# Patient Record
Sex: Female | Born: 1940 | Race: White | Hispanic: No | Smoking: Never smoker
Health system: Southern US, Community
[De-identification: ages and names within clinical notes are randomized; demographics above are authoritative.]

## PROBLEM LIST (undated history)

## (undated) DIAGNOSIS — E039 Hypothyroidism, unspecified: Secondary | ICD-10-CM

## (undated) DIAGNOSIS — I639 Cerebral infarction, unspecified: Secondary | ICD-10-CM

## (undated) DIAGNOSIS — S52531B Colles' fracture of right radius, initial encounter for open fracture type I or II: Secondary | ICD-10-CM

## (undated) DIAGNOSIS — R3915 Urgency of urination: Secondary | ICD-10-CM

## (undated) DIAGNOSIS — K219 Gastro-esophageal reflux disease without esophagitis: Secondary | ICD-10-CM

## (undated) DIAGNOSIS — I771 Stricture of artery: Principal | ICD-10-CM

## (undated) DIAGNOSIS — I10 Essential (primary) hypertension: Secondary | ICD-10-CM

## (undated) DIAGNOSIS — E538 Deficiency of other specified B group vitamins: Secondary | ICD-10-CM

## (undated) DIAGNOSIS — S32591A Other specified fracture of right pubis, initial encounter for closed fracture: Secondary | ICD-10-CM

## (undated) DIAGNOSIS — R413 Other amnesia: Secondary | ICD-10-CM

## (undated) DIAGNOSIS — G2401 Drug induced subacute dyskinesia: Secondary | ICD-10-CM

## (undated) DIAGNOSIS — I341 Nonrheumatic mitral (valve) prolapse: Secondary | ICD-10-CM

## (undated) DIAGNOSIS — F329 Major depressive disorder, single episode, unspecified: Secondary | ICD-10-CM

## (undated) DIAGNOSIS — S3210XA Unspecified fracture of sacrum, initial encounter for closed fracture: Secondary | ICD-10-CM

## (undated) DIAGNOSIS — S82843A Displaced bimalleolar fracture of unspecified lower leg, initial encounter for closed fracture: Secondary | ICD-10-CM

## (undated) DIAGNOSIS — G629 Polyneuropathy, unspecified: Secondary | ICD-10-CM

## (undated) DIAGNOSIS — M1990 Unspecified osteoarthritis, unspecified site: Secondary | ICD-10-CM

## (undated) DIAGNOSIS — R011 Cardiac murmur, unspecified: Secondary | ICD-10-CM

## (undated) DIAGNOSIS — J320 Chronic maxillary sinusitis: Secondary | ICD-10-CM

## (undated) DIAGNOSIS — H6980 Other specified disorders of Eustachian tube, unspecified ear: Secondary | ICD-10-CM

## (undated) DIAGNOSIS — H6990 Unspecified Eustachian tube disorder, unspecified ear: Secondary | ICD-10-CM

## (undated) DIAGNOSIS — C801 Malignant (primary) neoplasm, unspecified: Secondary | ICD-10-CM

## (undated) DIAGNOSIS — F32A Depression, unspecified: Secondary | ICD-10-CM

## (undated) DIAGNOSIS — K449 Diaphragmatic hernia without obstruction or gangrene: Secondary | ICD-10-CM

## (undated) DIAGNOSIS — R269 Unspecified abnormalities of gait and mobility: Secondary | ICD-10-CM

## (undated) HISTORY — DX: Cerebral infarction, unspecified: I63.9

## (undated) HISTORY — PX: EYE SURGERY: SHX253

## (undated) HISTORY — PX: LUMBAR SPINE SURGERY: SHX701

## (undated) HISTORY — DX: Unspecified eustachian tube disorder, unspecified ear: H69.90

## (undated) HISTORY — PX: BACK SURGERY: SHX140

## (undated) HISTORY — DX: Depression, unspecified: F32.A

## (undated) HISTORY — PX: BLEPHAROPLASTY: SUR158

## (undated) HISTORY — DX: Drug induced subacute dyskinesia: G24.01

## (undated) HISTORY — DX: Unspecified osteoarthritis, unspecified site: M19.90

## (undated) HISTORY — DX: Colles' fracture of right radius, initial encounter for open fracture type I or II: S52.531B

## (undated) HISTORY — DX: Displaced bimalleolar fracture of unspecified lower leg, initial encounter for closed fracture: S82.843A

## (undated) HISTORY — DX: Other specified fracture of right pubis, initial encounter for closed fracture: S32.591A

## (undated) HISTORY — PX: COLONOSCOPY W/ POLYPECTOMY: SHX1380

## (undated) HISTORY — DX: Polyneuropathy, unspecified: G62.9

## (undated) HISTORY — DX: Hypothyroidism, unspecified: E03.9

## (undated) HISTORY — DX: Chronic maxillary sinusitis: J32.0

## (undated) HISTORY — PX: FRACTURE SURGERY: SHX138

## (undated) HISTORY — PX: OTHER SURGICAL HISTORY: SHX169

## (undated) HISTORY — PX: DILATION AND CURETTAGE OF UTERUS: SHX78

## (undated) HISTORY — DX: Major depressive disorder, single episode, unspecified: F32.9

## (undated) HISTORY — PX: BREAST EXCISIONAL BIOPSY: SUR124

## (undated) HISTORY — DX: Nonrheumatic mitral (valve) prolapse: I34.1

## (undated) HISTORY — DX: Unspecified fracture of sacrum, initial encounter for closed fracture: S32.10XA

## (undated) HISTORY — DX: Diaphragmatic hernia without obstruction or gangrene: K44.9

## (undated) HISTORY — DX: Deficiency of other specified B group vitamins: E53.8

## (undated) HISTORY — DX: Stricture of artery: I77.1

## (undated) HISTORY — DX: Essential (primary) hypertension: I10

## (undated) HISTORY — PX: BREAST SURGERY: SHX581

## (undated) HISTORY — DX: Gastro-esophageal reflux disease without esophagitis: K21.9

## (undated) HISTORY — DX: Other specified disorders of Eustachian tube, unspecified ear: H69.80

## (undated) HISTORY — PX: JOINT REPLACEMENT: SHX530

## (undated) HISTORY — DX: Unspecified abnormalities of gait and mobility: R26.9

## (undated) HISTORY — PX: KNEE SURGERY: SHX244

## (undated) HISTORY — PX: ABDOMINAL HYSTERECTOMY: SHX81

---

## 1997-12-15 ENCOUNTER — Ambulatory Visit (HOSPITAL_COMMUNITY): Admission: RE | Admit: 1997-12-15 | Discharge: 1997-12-15 | Payer: Self-pay | Admitting: *Deleted

## 1998-01-05 ENCOUNTER — Ambulatory Visit (HOSPITAL_COMMUNITY): Admission: RE | Admit: 1998-01-05 | Discharge: 1998-01-05 | Payer: Self-pay | Admitting: *Deleted

## 1998-09-07 ENCOUNTER — Ambulatory Visit (HOSPITAL_COMMUNITY): Admission: RE | Admit: 1998-09-07 | Discharge: 1998-09-07 | Payer: Self-pay | Admitting: *Deleted

## 1998-09-07 ENCOUNTER — Encounter: Payer: Self-pay | Admitting: Internal Medicine

## 1999-01-18 ENCOUNTER — Other Ambulatory Visit: Admission: RE | Admit: 1999-01-18 | Discharge: 1999-01-18 | Payer: Self-pay | Admitting: *Deleted

## 1999-01-21 ENCOUNTER — Encounter (INDEPENDENT_AMBULATORY_CARE_PROVIDER_SITE_OTHER): Payer: Self-pay | Admitting: Specialist

## 1999-01-21 ENCOUNTER — Ambulatory Visit (HOSPITAL_COMMUNITY): Admission: RE | Admit: 1999-01-21 | Discharge: 1999-01-21 | Payer: Self-pay | Admitting: Gastroenterology

## 1999-02-13 ENCOUNTER — Inpatient Hospital Stay (HOSPITAL_COMMUNITY): Admission: EM | Admit: 1999-02-13 | Discharge: 1999-02-23 | Payer: Self-pay | Admitting: Specialist

## 1999-02-22 ENCOUNTER — Encounter: Payer: Self-pay | Admitting: Specialist

## 1999-04-06 ENCOUNTER — Emergency Department (HOSPITAL_COMMUNITY): Admission: EM | Admit: 1999-04-06 | Discharge: 1999-04-06 | Payer: Self-pay | Admitting: Emergency Medicine

## 1999-04-06 ENCOUNTER — Encounter: Payer: Self-pay | Admitting: Neurology

## 1999-05-11 ENCOUNTER — Ambulatory Visit (HOSPITAL_COMMUNITY): Admission: RE | Admit: 1999-05-11 | Discharge: 1999-05-11 | Payer: Self-pay | Admitting: Neurology

## 1999-05-11 ENCOUNTER — Encounter: Payer: Self-pay | Admitting: Neurology

## 1999-05-20 ENCOUNTER — Encounter: Admission: RE | Admit: 1999-05-20 | Discharge: 1999-05-20 | Payer: Self-pay | Admitting: Orthopaedic Surgery

## 1999-05-21 ENCOUNTER — Ambulatory Visit (HOSPITAL_BASED_OUTPATIENT_CLINIC_OR_DEPARTMENT_OTHER): Admission: RE | Admit: 1999-05-21 | Discharge: 1999-05-21 | Payer: Self-pay | Admitting: Orthopaedic Surgery

## 1999-05-21 ENCOUNTER — Encounter (INDEPENDENT_AMBULATORY_CARE_PROVIDER_SITE_OTHER): Payer: Self-pay

## 1999-10-04 ENCOUNTER — Other Ambulatory Visit: Admission: RE | Admit: 1999-10-04 | Discharge: 1999-10-04 | Payer: Self-pay | Admitting: Orthopaedic Surgery

## 1999-11-08 ENCOUNTER — Encounter: Admission: RE | Admit: 1999-11-08 | Discharge: 1999-11-08 | Payer: Self-pay | Admitting: *Deleted

## 1999-12-07 ENCOUNTER — Encounter: Payer: Self-pay | Admitting: Gastroenterology

## 1999-12-07 ENCOUNTER — Encounter: Admission: RE | Admit: 1999-12-07 | Discharge: 1999-12-07 | Payer: Self-pay | Admitting: Gastroenterology

## 2000-01-27 ENCOUNTER — Encounter: Payer: Self-pay | Admitting: Gastroenterology

## 2000-01-27 ENCOUNTER — Ambulatory Visit (HOSPITAL_COMMUNITY): Admission: RE | Admit: 2000-01-27 | Discharge: 2000-01-27 | Payer: Self-pay | Admitting: Gastroenterology

## 2000-02-02 ENCOUNTER — Ambulatory Visit (HOSPITAL_COMMUNITY): Admission: RE | Admit: 2000-02-02 | Discharge: 2000-02-02 | Payer: Self-pay | Admitting: Orthopaedic Surgery

## 2000-02-03 ENCOUNTER — Ambulatory Visit (HOSPITAL_COMMUNITY): Admission: RE | Admit: 2000-02-03 | Discharge: 2000-02-03 | Payer: Self-pay | Admitting: *Deleted

## 2000-02-03 ENCOUNTER — Other Ambulatory Visit: Admission: RE | Admit: 2000-02-03 | Discharge: 2000-02-03 | Payer: Self-pay | Admitting: *Deleted

## 2000-02-25 ENCOUNTER — Ambulatory Visit (HOSPITAL_COMMUNITY): Admission: RE | Admit: 2000-02-25 | Discharge: 2000-02-25 | Payer: Self-pay | Admitting: Orthopaedic Surgery

## 2000-03-10 ENCOUNTER — Ambulatory Visit (HOSPITAL_COMMUNITY): Admission: RE | Admit: 2000-03-10 | Discharge: 2000-03-10 | Payer: Self-pay | Admitting: Orthopaedic Surgery

## 2001-03-01 ENCOUNTER — Encounter: Payer: Self-pay | Admitting: Internal Medicine

## 2001-03-01 ENCOUNTER — Ambulatory Visit (HOSPITAL_COMMUNITY): Admission: RE | Admit: 2001-03-01 | Discharge: 2001-03-01 | Payer: Self-pay | Admitting: Internal Medicine

## 2001-10-16 ENCOUNTER — Encounter: Payer: Self-pay | Admitting: Internal Medicine

## 2001-10-16 ENCOUNTER — Encounter: Admission: RE | Admit: 2001-10-16 | Discharge: 2001-10-16 | Payer: Self-pay | Admitting: Internal Medicine

## 2002-02-28 ENCOUNTER — Other Ambulatory Visit: Admission: RE | Admit: 2002-02-28 | Discharge: 2002-02-28 | Payer: Self-pay | Admitting: Internal Medicine

## 2002-07-19 ENCOUNTER — Ambulatory Visit (HOSPITAL_COMMUNITY): Admission: RE | Admit: 2002-07-19 | Discharge: 2002-07-19 | Payer: Self-pay | Admitting: Internal Medicine

## 2002-07-19 ENCOUNTER — Encounter: Payer: Self-pay | Admitting: Internal Medicine

## 2002-09-02 ENCOUNTER — Encounter (HOSPITAL_COMMUNITY): Admission: RE | Admit: 2002-09-02 | Discharge: 2002-09-11 | Payer: Self-pay | Admitting: Podiatry

## 2003-03-10 ENCOUNTER — Other Ambulatory Visit: Admission: RE | Admit: 2003-03-10 | Discharge: 2003-03-10 | Payer: Self-pay | Admitting: Internal Medicine

## 2003-03-19 ENCOUNTER — Ambulatory Visit (HOSPITAL_COMMUNITY): Admission: RE | Admit: 2003-03-19 | Discharge: 2003-03-19 | Payer: Self-pay | Admitting: Internal Medicine

## 2003-03-19 ENCOUNTER — Encounter: Payer: Self-pay | Admitting: Emergency Medicine

## 2003-03-19 ENCOUNTER — Encounter: Payer: Self-pay | Admitting: Internal Medicine

## 2003-03-19 ENCOUNTER — Emergency Department (HOSPITAL_COMMUNITY): Admission: EM | Admit: 2003-03-19 | Discharge: 2003-03-19 | Payer: Self-pay | Admitting: Emergency Medicine

## 2003-03-21 ENCOUNTER — Ambulatory Visit (HOSPITAL_COMMUNITY): Admission: RE | Admit: 2003-03-21 | Discharge: 2003-03-21 | Payer: Self-pay | Admitting: Internal Medicine

## 2003-03-21 ENCOUNTER — Encounter: Payer: Self-pay | Admitting: Internal Medicine

## 2003-04-03 ENCOUNTER — Ambulatory Visit (HOSPITAL_COMMUNITY): Admission: RE | Admit: 2003-04-03 | Discharge: 2003-04-03 | Payer: Self-pay | Admitting: Gastroenterology

## 2003-04-14 ENCOUNTER — Other Ambulatory Visit: Admission: RE | Admit: 2003-04-14 | Discharge: 2003-04-14 | Payer: Self-pay | Admitting: Obstetrics and Gynecology

## 2003-04-15 ENCOUNTER — Ambulatory Visit (HOSPITAL_COMMUNITY): Admission: RE | Admit: 2003-04-15 | Discharge: 2003-04-15 | Payer: Self-pay | Admitting: Internal Medicine

## 2003-04-16 ENCOUNTER — Ambulatory Visit (HOSPITAL_COMMUNITY): Admission: RE | Admit: 2003-04-16 | Discharge: 2003-04-16 | Payer: Self-pay | Admitting: Internal Medicine

## 2003-05-07 ENCOUNTER — Ambulatory Visit (HOSPITAL_COMMUNITY): Admission: RE | Admit: 2003-05-07 | Discharge: 2003-05-07 | Payer: Self-pay | Admitting: Obstetrics and Gynecology

## 2003-05-07 ENCOUNTER — Encounter (INDEPENDENT_AMBULATORY_CARE_PROVIDER_SITE_OTHER): Payer: Self-pay | Admitting: Specialist

## 2003-06-09 ENCOUNTER — Ambulatory Visit (HOSPITAL_COMMUNITY): Admission: RE | Admit: 2003-06-09 | Discharge: 2003-06-09 | Payer: Self-pay | Admitting: Obstetrics and Gynecology

## 2003-06-10 ENCOUNTER — Ambulatory Visit (HOSPITAL_COMMUNITY): Admission: RE | Admit: 2003-06-10 | Discharge: 2003-06-10 | Payer: Self-pay | Admitting: Obstetrics and Gynecology

## 2003-08-20 ENCOUNTER — Ambulatory Visit (HOSPITAL_COMMUNITY): Admission: RE | Admit: 2003-08-20 | Discharge: 2003-08-20 | Payer: Self-pay | Admitting: Obstetrics and Gynecology

## 2003-10-10 ENCOUNTER — Ambulatory Visit (HOSPITAL_COMMUNITY): Admission: RE | Admit: 2003-10-10 | Discharge: 2003-10-10 | Payer: Self-pay | Admitting: *Deleted

## 2004-03-11 ENCOUNTER — Other Ambulatory Visit: Admission: RE | Admit: 2004-03-11 | Discharge: 2004-03-11 | Payer: Self-pay | Admitting: Internal Medicine

## 2004-04-06 ENCOUNTER — Ambulatory Visit (HOSPITAL_COMMUNITY): Admission: RE | Admit: 2004-04-06 | Discharge: 2004-04-06 | Payer: Self-pay | Admitting: Internal Medicine

## 2004-04-27 ENCOUNTER — Ambulatory Visit: Payer: Self-pay | Admitting: Cardiology

## 2004-06-03 ENCOUNTER — Ambulatory Visit: Payer: Self-pay | Admitting: Internal Medicine

## 2004-06-10 ENCOUNTER — Ambulatory Visit: Payer: Self-pay | Admitting: Internal Medicine

## 2004-06-18 ENCOUNTER — Ambulatory Visit: Payer: Self-pay | Admitting: Internal Medicine

## 2004-06-23 ENCOUNTER — Encounter: Admission: RE | Admit: 2004-06-23 | Discharge: 2004-06-23 | Payer: Self-pay | Admitting: General Surgery

## 2004-06-24 ENCOUNTER — Ambulatory Visit: Payer: Self-pay | Admitting: Internal Medicine

## 2004-06-30 ENCOUNTER — Ambulatory Visit: Payer: Self-pay | Admitting: Internal Medicine

## 2004-07-01 ENCOUNTER — Ambulatory Visit: Payer: Self-pay | Admitting: Internal Medicine

## 2004-07-15 ENCOUNTER — Ambulatory Visit: Payer: Self-pay | Admitting: Internal Medicine

## 2004-08-06 ENCOUNTER — Ambulatory Visit: Payer: Self-pay | Admitting: Internal Medicine

## 2004-08-16 ENCOUNTER — Ambulatory Visit: Payer: Self-pay | Admitting: Internal Medicine

## 2004-08-17 ENCOUNTER — Other Ambulatory Visit: Admission: RE | Admit: 2004-08-17 | Discharge: 2004-08-17 | Payer: Self-pay | Admitting: Obstetrics and Gynecology

## 2004-08-18 ENCOUNTER — Ambulatory Visit (HOSPITAL_COMMUNITY): Admission: RE | Admit: 2004-08-18 | Discharge: 2004-08-18 | Payer: Self-pay | Admitting: Internal Medicine

## 2004-08-27 ENCOUNTER — Ambulatory Visit: Payer: Self-pay | Admitting: Internal Medicine

## 2004-09-07 ENCOUNTER — Ambulatory Visit: Payer: Self-pay | Admitting: Internal Medicine

## 2004-09-08 ENCOUNTER — Ambulatory Visit (HOSPITAL_COMMUNITY): Admission: RE | Admit: 2004-09-08 | Discharge: 2004-09-08 | Payer: Self-pay | Admitting: Obstetrics and Gynecology

## 2004-09-21 ENCOUNTER — Ambulatory Visit: Payer: Self-pay | Admitting: Internal Medicine

## 2004-10-01 ENCOUNTER — Encounter (INDEPENDENT_AMBULATORY_CARE_PROVIDER_SITE_OTHER): Payer: Self-pay | Admitting: *Deleted

## 2004-10-01 ENCOUNTER — Ambulatory Visit (HOSPITAL_COMMUNITY): Admission: RE | Admit: 2004-10-01 | Discharge: 2004-10-01 | Payer: Self-pay | Admitting: Obstetrics and Gynecology

## 2004-10-21 ENCOUNTER — Ambulatory Visit: Payer: Self-pay | Admitting: Internal Medicine

## 2004-10-29 ENCOUNTER — Ambulatory Visit: Payer: Self-pay | Admitting: Internal Medicine

## 2004-11-05 ENCOUNTER — Ambulatory Visit: Payer: Self-pay | Admitting: Internal Medicine

## 2004-11-12 ENCOUNTER — Ambulatory Visit: Payer: Self-pay | Admitting: Internal Medicine

## 2004-11-19 ENCOUNTER — Ambulatory Visit: Payer: Self-pay | Admitting: Internal Medicine

## 2004-11-26 ENCOUNTER — Ambulatory Visit: Payer: Self-pay | Admitting: Internal Medicine

## 2004-12-03 ENCOUNTER — Ambulatory Visit: Payer: Self-pay | Admitting: Internal Medicine

## 2004-12-10 ENCOUNTER — Ambulatory Visit: Payer: Self-pay | Admitting: Internal Medicine

## 2004-12-17 ENCOUNTER — Ambulatory Visit: Payer: Self-pay | Admitting: Internal Medicine

## 2004-12-24 ENCOUNTER — Ambulatory Visit: Payer: Self-pay | Admitting: Internal Medicine

## 2005-01-04 ENCOUNTER — Ambulatory Visit: Payer: Self-pay | Admitting: Internal Medicine

## 2005-01-04 ENCOUNTER — Encounter: Admission: RE | Admit: 2005-01-04 | Discharge: 2005-01-04 | Payer: Self-pay | Admitting: Gastroenterology

## 2005-01-28 ENCOUNTER — Ambulatory Visit: Payer: Self-pay | Admitting: Internal Medicine

## 2005-02-01 ENCOUNTER — Ambulatory Visit: Payer: Self-pay | Admitting: Internal Medicine

## 2005-02-04 ENCOUNTER — Ambulatory Visit: Payer: Self-pay | Admitting: Internal Medicine

## 2005-02-08 ENCOUNTER — Ambulatory Visit: Payer: Self-pay | Admitting: Internal Medicine

## 2005-02-11 ENCOUNTER — Ambulatory Visit: Payer: Self-pay | Admitting: Internal Medicine

## 2005-02-18 ENCOUNTER — Ambulatory Visit: Payer: Self-pay | Admitting: Internal Medicine

## 2005-02-18 ENCOUNTER — Ambulatory Visit: Payer: Self-pay | Admitting: Pulmonary Disease

## 2005-02-25 ENCOUNTER — Ambulatory Visit: Payer: Self-pay | Admitting: Internal Medicine

## 2005-03-01 ENCOUNTER — Ambulatory Visit: Payer: Self-pay | Admitting: Internal Medicine

## 2005-03-04 ENCOUNTER — Ambulatory Visit: Payer: Self-pay | Admitting: Internal Medicine

## 2005-03-10 ENCOUNTER — Ambulatory Visit: Payer: Self-pay | Admitting: Internal Medicine

## 2005-03-14 ENCOUNTER — Ambulatory Visit: Payer: Self-pay | Admitting: Internal Medicine

## 2005-03-18 ENCOUNTER — Ambulatory Visit: Payer: Self-pay | Admitting: Internal Medicine

## 2005-03-24 ENCOUNTER — Ambulatory Visit: Payer: Self-pay | Admitting: Internal Medicine

## 2005-03-28 ENCOUNTER — Ambulatory Visit: Payer: Self-pay | Admitting: Internal Medicine

## 2005-04-11 ENCOUNTER — Ambulatory Visit: Payer: Self-pay | Admitting: Internal Medicine

## 2005-04-18 ENCOUNTER — Ambulatory Visit: Payer: Self-pay | Admitting: Internal Medicine

## 2005-04-26 ENCOUNTER — Ambulatory Visit: Payer: Self-pay | Admitting: Internal Medicine

## 2005-04-29 ENCOUNTER — Ambulatory Visit: Payer: Self-pay | Admitting: Internal Medicine

## 2005-05-03 ENCOUNTER — Ambulatory Visit: Payer: Self-pay | Admitting: Cardiology

## 2005-05-04 ENCOUNTER — Encounter: Admission: RE | Admit: 2005-05-04 | Discharge: 2005-05-04 | Payer: Self-pay | Admitting: Obstetrics and Gynecology

## 2005-05-04 ENCOUNTER — Encounter (INDEPENDENT_AMBULATORY_CARE_PROVIDER_SITE_OTHER): Payer: Self-pay | Admitting: Specialist

## 2005-05-05 ENCOUNTER — Ambulatory Visit: Payer: Self-pay | Admitting: Internal Medicine

## 2005-05-13 ENCOUNTER — Ambulatory Visit: Payer: Self-pay | Admitting: Internal Medicine

## 2005-05-25 ENCOUNTER — Ambulatory Visit: Payer: Self-pay | Admitting: Internal Medicine

## 2005-06-02 ENCOUNTER — Ambulatory Visit: Payer: Self-pay | Admitting: Internal Medicine

## 2005-06-10 ENCOUNTER — Ambulatory Visit: Payer: Self-pay | Admitting: Internal Medicine

## 2005-06-24 ENCOUNTER — Ambulatory Visit: Payer: Self-pay | Admitting: Internal Medicine

## 2005-06-29 ENCOUNTER — Ambulatory Visit: Payer: Self-pay | Admitting: Internal Medicine

## 2005-07-05 ENCOUNTER — Encounter: Admission: RE | Admit: 2005-07-05 | Discharge: 2005-07-05 | Payer: Self-pay | Admitting: *Deleted

## 2005-07-08 ENCOUNTER — Ambulatory Visit: Payer: Self-pay | Admitting: Internal Medicine

## 2005-07-29 ENCOUNTER — Ambulatory Visit: Payer: Self-pay | Admitting: Internal Medicine

## 2005-08-12 ENCOUNTER — Ambulatory Visit: Payer: Self-pay | Admitting: Internal Medicine

## 2005-08-23 ENCOUNTER — Ambulatory Visit: Payer: Self-pay | Admitting: Internal Medicine

## 2005-08-31 ENCOUNTER — Ambulatory Visit: Payer: Self-pay | Admitting: Internal Medicine

## 2005-09-05 ENCOUNTER — Inpatient Hospital Stay (HOSPITAL_COMMUNITY): Admission: RE | Admit: 2005-09-05 | Discharge: 2005-09-09 | Payer: Self-pay | Admitting: Orthopaedic Surgery

## 2005-09-15 ENCOUNTER — Ambulatory Visit: Payer: Self-pay | Admitting: Endocrinology

## 2005-11-18 ENCOUNTER — Ambulatory Visit: Payer: Self-pay | Admitting: Internal Medicine

## 2005-11-23 ENCOUNTER — Ambulatory Visit: Payer: Self-pay | Admitting: Internal Medicine

## 2005-11-25 ENCOUNTER — Ambulatory Visit: Payer: Self-pay | Admitting: Internal Medicine

## 2005-12-02 ENCOUNTER — Ambulatory Visit: Payer: Self-pay | Admitting: Internal Medicine

## 2005-12-07 ENCOUNTER — Ambulatory Visit: Payer: Self-pay | Admitting: Internal Medicine

## 2005-12-09 ENCOUNTER — Ambulatory Visit: Payer: Self-pay | Admitting: Internal Medicine

## 2005-12-16 ENCOUNTER — Ambulatory Visit: Payer: Self-pay | Admitting: Internal Medicine

## 2005-12-19 ENCOUNTER — Ambulatory Visit: Payer: Self-pay | Admitting: Internal Medicine

## 2005-12-21 ENCOUNTER — Ambulatory Visit: Payer: Self-pay | Admitting: Internal Medicine

## 2005-12-23 ENCOUNTER — Ambulatory Visit: Payer: Self-pay | Admitting: Internal Medicine

## 2005-12-30 ENCOUNTER — Ambulatory Visit: Payer: Self-pay | Admitting: Internal Medicine

## 2006-01-03 ENCOUNTER — Encounter: Admission: RE | Admit: 2006-01-03 | Discharge: 2006-01-03 | Payer: Self-pay | Admitting: Obstetrics and Gynecology

## 2006-01-06 ENCOUNTER — Ambulatory Visit: Payer: Self-pay | Admitting: Internal Medicine

## 2006-01-10 ENCOUNTER — Ambulatory Visit: Payer: Self-pay | Admitting: Internal Medicine

## 2006-01-13 ENCOUNTER — Ambulatory Visit: Payer: Self-pay | Admitting: Internal Medicine

## 2006-01-16 ENCOUNTER — Ambulatory Visit: Payer: Self-pay | Admitting: Internal Medicine

## 2006-01-18 ENCOUNTER — Ambulatory Visit: Payer: Self-pay | Admitting: Internal Medicine

## 2006-01-20 ENCOUNTER — Ambulatory Visit: Payer: Self-pay | Admitting: Internal Medicine

## 2006-01-25 ENCOUNTER — Ambulatory Visit: Payer: Self-pay | Admitting: Internal Medicine

## 2006-01-31 ENCOUNTER — Ambulatory Visit: Payer: Self-pay | Admitting: Internal Medicine

## 2006-02-03 ENCOUNTER — Ambulatory Visit: Payer: Self-pay | Admitting: Internal Medicine

## 2006-02-08 ENCOUNTER — Ambulatory Visit: Payer: Self-pay | Admitting: Internal Medicine

## 2006-02-10 ENCOUNTER — Ambulatory Visit: Payer: Self-pay | Admitting: Internal Medicine

## 2006-02-13 ENCOUNTER — Ambulatory Visit: Payer: Self-pay | Admitting: Internal Medicine

## 2006-02-15 ENCOUNTER — Ambulatory Visit: Payer: Self-pay | Admitting: Internal Medicine

## 2006-02-22 ENCOUNTER — Ambulatory Visit: Payer: Self-pay | Admitting: Internal Medicine

## 2006-02-24 ENCOUNTER — Ambulatory Visit: Payer: Self-pay | Admitting: Internal Medicine

## 2006-03-03 ENCOUNTER — Ambulatory Visit: Payer: Self-pay | Admitting: Internal Medicine

## 2006-03-08 ENCOUNTER — Ambulatory Visit: Payer: Self-pay | Admitting: Internal Medicine

## 2006-03-10 ENCOUNTER — Ambulatory Visit: Payer: Self-pay | Admitting: Internal Medicine

## 2006-03-17 ENCOUNTER — Ambulatory Visit: Payer: Self-pay | Admitting: Internal Medicine

## 2006-03-24 ENCOUNTER — Ambulatory Visit: Payer: Self-pay | Admitting: Internal Medicine

## 2006-03-27 ENCOUNTER — Ambulatory Visit: Payer: Self-pay | Admitting: Internal Medicine

## 2006-04-03 ENCOUNTER — Ambulatory Visit: Payer: Self-pay | Admitting: Internal Medicine

## 2006-04-14 ENCOUNTER — Ambulatory Visit: Payer: Self-pay | Admitting: Internal Medicine

## 2006-04-17 ENCOUNTER — Ambulatory Visit: Payer: Self-pay | Admitting: Internal Medicine

## 2006-04-27 ENCOUNTER — Ambulatory Visit: Payer: Self-pay | Admitting: Internal Medicine

## 2006-05-10 ENCOUNTER — Ambulatory Visit: Payer: Self-pay | Admitting: Internal Medicine

## 2006-05-17 ENCOUNTER — Ambulatory Visit: Payer: Self-pay | Admitting: Internal Medicine

## 2006-05-24 ENCOUNTER — Ambulatory Visit: Payer: Self-pay | Admitting: Cardiology

## 2006-05-26 ENCOUNTER — Ambulatory Visit: Payer: Self-pay | Admitting: Internal Medicine

## 2006-06-01 ENCOUNTER — Ambulatory Visit: Payer: Self-pay | Admitting: Internal Medicine

## 2006-06-08 ENCOUNTER — Ambulatory Visit: Payer: Self-pay | Admitting: Internal Medicine

## 2006-06-14 ENCOUNTER — Encounter: Admission: RE | Admit: 2006-06-14 | Discharge: 2006-06-14 | Payer: Self-pay | Admitting: Orthopaedic Surgery

## 2006-06-19 ENCOUNTER — Ambulatory Visit (HOSPITAL_COMMUNITY): Admission: RE | Admit: 2006-06-19 | Discharge: 2006-06-20 | Payer: Self-pay | Admitting: Orthopaedic Surgery

## 2006-08-15 ENCOUNTER — Ambulatory Visit: Payer: Self-pay | Admitting: Internal Medicine

## 2006-10-05 ENCOUNTER — Encounter (INDEPENDENT_AMBULATORY_CARE_PROVIDER_SITE_OTHER): Payer: Self-pay | Admitting: Specialist

## 2006-10-05 ENCOUNTER — Inpatient Hospital Stay (HOSPITAL_COMMUNITY): Admission: RE | Admit: 2006-10-05 | Discharge: 2006-10-07 | Payer: Self-pay | Admitting: Obstetrics and Gynecology

## 2006-10-12 ENCOUNTER — Inpatient Hospital Stay (HOSPITAL_COMMUNITY): Admission: AD | Admit: 2006-10-12 | Discharge: 2006-10-13 | Payer: Self-pay | Admitting: Obstetrics and Gynecology

## 2006-11-06 ENCOUNTER — Ambulatory Visit: Payer: Self-pay | Admitting: Internal Medicine

## 2006-11-17 ENCOUNTER — Ambulatory Visit: Payer: Self-pay | Admitting: Internal Medicine

## 2006-11-24 ENCOUNTER — Ambulatory Visit: Payer: Self-pay | Admitting: Internal Medicine

## 2006-11-27 ENCOUNTER — Ambulatory Visit: Payer: Self-pay | Admitting: Internal Medicine

## 2006-11-30 ENCOUNTER — Encounter: Admission: RE | Admit: 2006-11-30 | Discharge: 2006-11-30 | Payer: Self-pay | Admitting: Orthopaedic Surgery

## 2006-12-08 ENCOUNTER — Ambulatory Visit: Payer: Self-pay | Admitting: Internal Medicine

## 2006-12-15 ENCOUNTER — Encounter: Admission: RE | Admit: 2006-12-15 | Discharge: 2006-12-15 | Payer: Self-pay | Admitting: Orthopaedic Surgery

## 2006-12-20 ENCOUNTER — Ambulatory Visit: Payer: Self-pay | Admitting: Internal Medicine

## 2006-12-20 LAB — CONVERTED CEMR LAB
Basophils Absolute: 0 10*3/uL (ref 0.0–0.1)
Creatinine, Ser: 0.9 mg/dL (ref 0.4–1.2)
Direct LDL: 113.1 mg/dL
Eosinophils Relative: 2.4 % (ref 0.0–5.0)
Folate: >20 ng/mL
Hemoglobin: 13.3 g/dL (ref 12.0–15.0)
MCHC: 34.8 g/dL (ref 30.0–36.0)
Monocytes Absolute: 0.7 10*3/uL (ref 0.2–0.7)
Monocytes Relative: 6.7 % (ref 3.0–11.0)
Neutro Abs: 8.1 10*3/uL — ABNORMAL HIGH (ref 1.4–7.7)
RBC: 4.21 M/uL (ref 3.87–5.11)
Total CHOL/HDL Ratio: 4.4
VLDL: 56 mg/dL — ABNORMAL HIGH (ref 0–40)
WBC: 11.1 10*3/uL — ABNORMAL HIGH (ref 4.5–10.5)

## 2006-12-21 ENCOUNTER — Ambulatory Visit: Payer: Self-pay | Admitting: Cardiology

## 2006-12-22 ENCOUNTER — Ambulatory Visit: Payer: Self-pay | Admitting: Internal Medicine

## 2006-12-29 ENCOUNTER — Ambulatory Visit: Payer: Self-pay | Admitting: Internal Medicine

## 2007-01-19 ENCOUNTER — Ambulatory Visit: Payer: Self-pay | Admitting: Internal Medicine

## 2007-01-26 ENCOUNTER — Ambulatory Visit: Payer: Self-pay | Admitting: Internal Medicine

## 2007-02-02 ENCOUNTER — Ambulatory Visit: Payer: Self-pay | Admitting: Internal Medicine

## 2007-02-09 ENCOUNTER — Ambulatory Visit: Payer: Self-pay | Admitting: Internal Medicine

## 2007-02-13 ENCOUNTER — Ambulatory Visit: Payer: Self-pay | Admitting: Internal Medicine

## 2007-02-26 ENCOUNTER — Encounter: Payer: Self-pay | Admitting: *Deleted

## 2007-02-26 DIAGNOSIS — J309 Allergic rhinitis, unspecified: Secondary | ICD-10-CM

## 2007-02-26 DIAGNOSIS — G244 Idiopathic orofacial dystonia: Secondary | ICD-10-CM

## 2007-02-26 DIAGNOSIS — E039 Hypothyroidism, unspecified: Secondary | ICD-10-CM | POA: Insufficient documentation

## 2007-02-26 DIAGNOSIS — H409 Unspecified glaucoma: Secondary | ICD-10-CM

## 2007-02-26 DIAGNOSIS — K219 Gastro-esophageal reflux disease without esophagitis: Secondary | ICD-10-CM

## 2007-02-26 DIAGNOSIS — M810 Age-related osteoporosis without current pathological fracture: Secondary | ICD-10-CM | POA: Insufficient documentation

## 2007-02-26 DIAGNOSIS — H6980 Other specified disorders of Eustachian tube, unspecified ear: Secondary | ICD-10-CM

## 2007-02-26 DIAGNOSIS — I10 Essential (primary) hypertension: Secondary | ICD-10-CM | POA: Insufficient documentation

## 2007-02-26 DIAGNOSIS — K449 Diaphragmatic hernia without obstruction or gangrene: Secondary | ICD-10-CM

## 2007-02-26 DIAGNOSIS — F4323 Adjustment disorder with mixed anxiety and depressed mood: Secondary | ICD-10-CM

## 2007-03-13 ENCOUNTER — Ambulatory Visit: Payer: Self-pay | Admitting: Internal Medicine

## 2007-03-23 ENCOUNTER — Encounter: Payer: Self-pay | Admitting: Internal Medicine

## 2007-03-23 ENCOUNTER — Ambulatory Visit: Payer: Self-pay | Admitting: Internal Medicine

## 2007-03-30 ENCOUNTER — Ambulatory Visit: Payer: Self-pay | Admitting: Internal Medicine

## 2007-04-06 ENCOUNTER — Ambulatory Visit: Payer: Self-pay | Admitting: Internal Medicine

## 2007-04-13 ENCOUNTER — Ambulatory Visit: Payer: Self-pay | Admitting: Internal Medicine

## 2007-04-20 ENCOUNTER — Encounter: Payer: Self-pay | Admitting: Internal Medicine

## 2007-04-20 ENCOUNTER — Ambulatory Visit: Payer: Self-pay | Admitting: Internal Medicine

## 2007-04-20 DIAGNOSIS — J320 Chronic maxillary sinusitis: Secondary | ICD-10-CM

## 2007-04-27 ENCOUNTER — Ambulatory Visit: Payer: Self-pay | Admitting: Internal Medicine

## 2007-04-27 ENCOUNTER — Telehealth (INDEPENDENT_AMBULATORY_CARE_PROVIDER_SITE_OTHER): Payer: Self-pay | Admitting: *Deleted

## 2007-05-17 ENCOUNTER — Ambulatory Visit: Payer: Self-pay | Admitting: Internal Medicine

## 2007-06-01 LAB — HM COLONOSCOPY: HM Colonoscopy: NORMAL

## 2007-06-04 ENCOUNTER — Encounter: Payer: Self-pay | Admitting: Internal Medicine

## 2007-06-15 ENCOUNTER — Ambulatory Visit: Payer: Self-pay | Admitting: Internal Medicine

## 2007-06-22 ENCOUNTER — Ambulatory Visit: Payer: Self-pay | Admitting: Internal Medicine

## 2007-07-04 ENCOUNTER — Ambulatory Visit: Payer: Self-pay | Admitting: Internal Medicine

## 2007-07-05 ENCOUNTER — Ambulatory Visit: Payer: Self-pay | Admitting: Internal Medicine

## 2007-07-12 ENCOUNTER — Ambulatory Visit: Payer: Self-pay | Admitting: Internal Medicine

## 2007-07-17 ENCOUNTER — Telehealth: Payer: Self-pay | Admitting: Internal Medicine

## 2007-07-20 ENCOUNTER — Ambulatory Visit: Payer: Self-pay | Admitting: Internal Medicine

## 2007-07-24 ENCOUNTER — Telehealth (INDEPENDENT_AMBULATORY_CARE_PROVIDER_SITE_OTHER): Payer: Self-pay | Admitting: *Deleted

## 2007-07-26 ENCOUNTER — Ambulatory Visit: Payer: Self-pay | Admitting: Internal Medicine

## 2007-07-31 ENCOUNTER — Ambulatory Visit: Payer: Self-pay | Admitting: Internal Medicine

## 2007-08-03 ENCOUNTER — Ambulatory Visit: Payer: Self-pay | Admitting: Internal Medicine

## 2007-08-03 LAB — CONVERTED CEMR LAB
Basophils Absolute: 0 10*3/uL (ref 0.0–0.1)
Basophils Relative: 0.5 % (ref 0.0–1.0)
Eosinophils Absolute: 0.3 10*3/uL (ref 0.0–0.6)
Eosinophils Relative: 3.6 % (ref 0.0–5.0)
HCT: 39.7 % (ref 36.0–46.0)
Hemoglobin: 13.7 g/dL (ref 12.0–15.0)
MCV: 96.5 fL (ref 78.0–100.0)
Neutro Abs: 5.9 10*3/uL (ref 1.4–7.7)
Neutrophils Relative %: 66.2 % (ref 43.0–77.0)
RBC: 4.11 M/uL (ref 3.87–5.11)

## 2007-08-05 ENCOUNTER — Encounter: Payer: Self-pay | Admitting: Internal Medicine

## 2007-08-16 ENCOUNTER — Ambulatory Visit: Payer: Self-pay | Admitting: Internal Medicine

## 2007-08-16 ENCOUNTER — Encounter: Payer: Self-pay | Admitting: Internal Medicine

## 2007-08-30 ENCOUNTER — Ambulatory Visit: Payer: Self-pay | Admitting: Internal Medicine

## 2007-09-13 ENCOUNTER — Encounter: Payer: Self-pay | Admitting: Internal Medicine

## 2007-10-02 ENCOUNTER — Ambulatory Visit: Payer: Self-pay | Admitting: Internal Medicine

## 2007-10-09 ENCOUNTER — Ambulatory Visit: Payer: Self-pay | Admitting: Internal Medicine

## 2007-10-11 ENCOUNTER — Telehealth: Payer: Self-pay | Admitting: Internal Medicine

## 2007-10-24 ENCOUNTER — Ambulatory Visit: Payer: Self-pay | Admitting: Internal Medicine

## 2007-11-02 ENCOUNTER — Ambulatory Visit: Payer: Self-pay | Admitting: Internal Medicine

## 2007-11-16 ENCOUNTER — Ambulatory Visit: Payer: Self-pay | Admitting: Internal Medicine

## 2007-11-23 ENCOUNTER — Ambulatory Visit: Payer: Self-pay | Admitting: Internal Medicine

## 2007-12-06 ENCOUNTER — Telehealth: Payer: Self-pay | Admitting: Internal Medicine

## 2007-12-07 ENCOUNTER — Ambulatory Visit: Payer: Self-pay | Admitting: Internal Medicine

## 2007-12-08 ENCOUNTER — Encounter: Payer: Self-pay | Admitting: Internal Medicine

## 2007-12-11 ENCOUNTER — Encounter: Payer: Self-pay | Admitting: Internal Medicine

## 2007-12-14 ENCOUNTER — Ambulatory Visit: Payer: Self-pay | Admitting: Internal Medicine

## 2007-12-27 ENCOUNTER — Ambulatory Visit: Payer: Self-pay | Admitting: Internal Medicine

## 2008-01-04 ENCOUNTER — Ambulatory Visit: Payer: Self-pay | Admitting: Internal Medicine

## 2008-01-08 ENCOUNTER — Encounter: Admission: RE | Admit: 2008-01-08 | Discharge: 2008-01-08 | Payer: Self-pay | Admitting: Obstetrics and Gynecology

## 2008-01-09 ENCOUNTER — Ambulatory Visit: Payer: Self-pay | Admitting: Internal Medicine

## 2008-01-24 ENCOUNTER — Ambulatory Visit: Payer: Self-pay | Admitting: Internal Medicine

## 2008-01-29 ENCOUNTER — Telehealth: Payer: Self-pay | Admitting: Internal Medicine

## 2008-02-06 ENCOUNTER — Ambulatory Visit: Payer: Self-pay | Admitting: Internal Medicine

## 2008-02-07 ENCOUNTER — Ambulatory Visit: Payer: Self-pay | Admitting: Internal Medicine

## 2008-02-08 ENCOUNTER — Ambulatory Visit: Payer: Self-pay | Admitting: Internal Medicine

## 2008-02-11 ENCOUNTER — Encounter: Payer: Self-pay | Admitting: Internal Medicine

## 2008-02-12 ENCOUNTER — Telehealth: Payer: Self-pay | Admitting: Internal Medicine

## 2008-02-22 ENCOUNTER — Encounter: Payer: Self-pay | Admitting: Internal Medicine

## 2008-02-22 ENCOUNTER — Ambulatory Visit: Payer: Self-pay | Admitting: Pulmonary Disease

## 2008-02-29 ENCOUNTER — Ambulatory Visit: Payer: Self-pay | Admitting: Internal Medicine

## 2008-03-07 ENCOUNTER — Ambulatory Visit: Payer: Self-pay | Admitting: Internal Medicine

## 2008-03-14 ENCOUNTER — Ambulatory Visit: Payer: Self-pay | Admitting: Internal Medicine

## 2008-03-21 ENCOUNTER — Ambulatory Visit: Payer: Self-pay | Admitting: Internal Medicine

## 2008-03-26 ENCOUNTER — Ambulatory Visit: Payer: Self-pay | Admitting: Internal Medicine

## 2008-04-04 ENCOUNTER — Ambulatory Visit: Payer: Self-pay | Admitting: Internal Medicine

## 2008-04-11 ENCOUNTER — Ambulatory Visit: Payer: Self-pay | Admitting: Internal Medicine

## 2008-04-18 ENCOUNTER — Ambulatory Visit: Payer: Self-pay | Admitting: Internal Medicine

## 2008-04-22 ENCOUNTER — Ambulatory Visit: Payer: Self-pay | Admitting: Internal Medicine

## 2008-04-22 DIAGNOSIS — K529 Noninfective gastroenteritis and colitis, unspecified: Secondary | ICD-10-CM

## 2008-04-22 DIAGNOSIS — R1011 Right upper quadrant pain: Secondary | ICD-10-CM

## 2008-04-24 ENCOUNTER — Encounter: Payer: Self-pay | Admitting: Internal Medicine

## 2008-04-29 ENCOUNTER — Telehealth: Payer: Self-pay | Admitting: Internal Medicine

## 2008-04-30 ENCOUNTER — Telehealth: Payer: Self-pay | Admitting: Internal Medicine

## 2008-05-02 ENCOUNTER — Ambulatory Visit: Payer: Self-pay | Admitting: Internal Medicine

## 2008-05-05 ENCOUNTER — Encounter: Admission: RE | Admit: 2008-05-05 | Discharge: 2008-05-05 | Payer: Self-pay | Admitting: Internal Medicine

## 2008-05-08 ENCOUNTER — Telehealth: Payer: Self-pay | Admitting: Internal Medicine

## 2008-05-08 ENCOUNTER — Encounter: Payer: Self-pay | Admitting: Internal Medicine

## 2008-05-09 ENCOUNTER — Ambulatory Visit: Payer: Self-pay | Admitting: Internal Medicine

## 2008-05-09 ENCOUNTER — Encounter (INDEPENDENT_AMBULATORY_CARE_PROVIDER_SITE_OTHER): Payer: Self-pay | Admitting: *Deleted

## 2008-06-04 DIAGNOSIS — I999 Unspecified disorder of circulatory system: Secondary | ICD-10-CM | POA: Insufficient documentation

## 2008-06-04 DIAGNOSIS — M1990 Unspecified osteoarthritis, unspecified site: Secondary | ICD-10-CM

## 2008-06-05 ENCOUNTER — Ambulatory Visit: Payer: Self-pay | Admitting: Gastroenterology

## 2008-06-05 DIAGNOSIS — K635 Polyp of colon: Secondary | ICD-10-CM

## 2008-06-09 ENCOUNTER — Ambulatory Visit: Payer: Self-pay | Admitting: Internal Medicine

## 2008-06-11 ENCOUNTER — Encounter: Admission: RE | Admit: 2008-06-11 | Discharge: 2008-06-11 | Payer: Self-pay | Admitting: Gastroenterology

## 2008-06-16 ENCOUNTER — Encounter: Payer: Self-pay | Admitting: Internal Medicine

## 2008-06-20 ENCOUNTER — Ambulatory Visit: Payer: Self-pay | Admitting: Internal Medicine

## 2008-06-25 ENCOUNTER — Telehealth (INDEPENDENT_AMBULATORY_CARE_PROVIDER_SITE_OTHER): Payer: Self-pay | Admitting: *Deleted

## 2008-07-11 ENCOUNTER — Ambulatory Visit: Payer: Self-pay | Admitting: Internal Medicine

## 2008-07-22 ENCOUNTER — Encounter: Payer: Self-pay | Admitting: Internal Medicine

## 2008-07-24 ENCOUNTER — Telehealth: Payer: Self-pay | Admitting: Internal Medicine

## 2008-08-01 ENCOUNTER — Ambulatory Visit: Payer: Self-pay | Admitting: Internal Medicine

## 2008-08-01 ENCOUNTER — Telehealth (INDEPENDENT_AMBULATORY_CARE_PROVIDER_SITE_OTHER): Payer: Self-pay | Admitting: *Deleted

## 2008-08-05 ENCOUNTER — Ambulatory Visit: Payer: Self-pay | Admitting: Internal Medicine

## 2008-08-05 ENCOUNTER — Telehealth: Payer: Self-pay | Admitting: Internal Medicine

## 2008-08-07 ENCOUNTER — Ambulatory Visit: Payer: Self-pay | Admitting: Internal Medicine

## 2008-08-07 ENCOUNTER — Telehealth (INDEPENDENT_AMBULATORY_CARE_PROVIDER_SITE_OTHER): Payer: Self-pay | Admitting: *Deleted

## 2008-08-15 ENCOUNTER — Ambulatory Visit: Payer: Self-pay | Admitting: Internal Medicine

## 2008-08-20 ENCOUNTER — Encounter: Payer: Self-pay | Admitting: Internal Medicine

## 2008-08-22 ENCOUNTER — Ambulatory Visit: Payer: Self-pay | Admitting: Internal Medicine

## 2008-08-26 ENCOUNTER — Encounter: Payer: Self-pay | Admitting: Internal Medicine

## 2008-09-05 ENCOUNTER — Ambulatory Visit: Payer: Self-pay | Admitting: Internal Medicine

## 2008-09-12 ENCOUNTER — Ambulatory Visit: Payer: Self-pay | Admitting: Internal Medicine

## 2008-10-01 ENCOUNTER — Telehealth: Payer: Self-pay | Admitting: Internal Medicine

## 2008-10-02 ENCOUNTER — Encounter: Payer: Self-pay | Admitting: Internal Medicine

## 2008-10-13 ENCOUNTER — Encounter: Payer: Self-pay | Admitting: Internal Medicine

## 2008-10-16 ENCOUNTER — Ambulatory Visit: Payer: Self-pay | Admitting: Internal Medicine

## 2008-10-17 ENCOUNTER — Ambulatory Visit: Payer: Self-pay | Admitting: Internal Medicine

## 2008-11-05 ENCOUNTER — Ambulatory Visit: Payer: Self-pay | Admitting: Internal Medicine

## 2008-11-14 ENCOUNTER — Ambulatory Visit: Payer: Self-pay | Admitting: Internal Medicine

## 2008-11-24 ENCOUNTER — Telehealth (INDEPENDENT_AMBULATORY_CARE_PROVIDER_SITE_OTHER): Payer: Self-pay | Admitting: *Deleted

## 2008-11-26 ENCOUNTER — Encounter: Payer: Self-pay | Admitting: Internal Medicine

## 2008-12-11 ENCOUNTER — Telehealth: Payer: Self-pay | Admitting: Internal Medicine

## 2008-12-25 ENCOUNTER — Ambulatory Visit: Payer: Self-pay | Admitting: Internal Medicine

## 2009-01-15 ENCOUNTER — Encounter: Payer: Self-pay | Admitting: Internal Medicine

## 2009-02-03 ENCOUNTER — Ambulatory Visit: Payer: Self-pay | Admitting: Internal Medicine

## 2009-02-03 DIAGNOSIS — J329 Chronic sinusitis, unspecified: Secondary | ICD-10-CM

## 2009-02-05 ENCOUNTER — Encounter: Payer: Self-pay | Admitting: Internal Medicine

## 2009-02-09 ENCOUNTER — Encounter: Payer: Self-pay | Admitting: Internal Medicine

## 2009-02-11 ENCOUNTER — Encounter: Payer: Self-pay | Admitting: Internal Medicine

## 2009-02-12 ENCOUNTER — Ambulatory Visit: Payer: Self-pay | Admitting: Cardiology

## 2009-02-16 ENCOUNTER — Telehealth: Payer: Self-pay | Admitting: Internal Medicine

## 2009-03-02 ENCOUNTER — Telehealth: Payer: Self-pay | Admitting: Internal Medicine

## 2009-03-30 ENCOUNTER — Ambulatory Visit: Payer: Self-pay | Admitting: Internal Medicine

## 2009-03-31 ENCOUNTER — Ambulatory Visit: Payer: Self-pay | Admitting: Internal Medicine

## 2009-04-28 ENCOUNTER — Ambulatory Visit: Payer: Self-pay | Admitting: Internal Medicine

## 2009-06-05 ENCOUNTER — Ambulatory Visit: Payer: Self-pay | Admitting: Internal Medicine

## 2009-06-08 ENCOUNTER — Telehealth: Payer: Self-pay | Admitting: Internal Medicine

## 2009-06-11 ENCOUNTER — Encounter: Admission: RE | Admit: 2009-06-11 | Discharge: 2009-06-11 | Payer: Self-pay | Admitting: Orthopaedic Surgery

## 2009-06-16 ENCOUNTER — Encounter: Payer: Self-pay | Admitting: Internal Medicine

## 2009-06-23 ENCOUNTER — Ambulatory Visit: Payer: Self-pay | Admitting: Internal Medicine

## 2009-07-07 ENCOUNTER — Encounter: Payer: Self-pay | Admitting: Internal Medicine

## 2009-07-07 ENCOUNTER — Ambulatory Visit: Payer: Self-pay | Admitting: Internal Medicine

## 2009-07-13 ENCOUNTER — Inpatient Hospital Stay (HOSPITAL_COMMUNITY): Admission: RE | Admit: 2009-07-13 | Discharge: 2009-07-21 | Payer: Self-pay | Admitting: Orthopaedic Surgery

## 2009-07-15 ENCOUNTER — Telehealth: Payer: Self-pay | Admitting: Internal Medicine

## 2009-08-13 ENCOUNTER — Encounter: Payer: Self-pay | Admitting: Internal Medicine

## 2009-10-23 ENCOUNTER — Telehealth: Payer: Self-pay | Admitting: Internal Medicine

## 2009-11-19 ENCOUNTER — Encounter: Payer: Self-pay | Admitting: Internal Medicine

## 2009-11-23 ENCOUNTER — Telehealth: Payer: Self-pay | Admitting: Internal Medicine

## 2009-11-24 ENCOUNTER — Encounter: Payer: Self-pay | Admitting: Internal Medicine

## 2009-11-27 ENCOUNTER — Telehealth: Payer: Self-pay | Admitting: Internal Medicine

## 2009-12-04 ENCOUNTER — Ambulatory Visit: Payer: Self-pay | Admitting: Internal Medicine

## 2009-12-22 ENCOUNTER — Encounter: Payer: Self-pay | Admitting: Internal Medicine

## 2010-01-01 ENCOUNTER — Ambulatory Visit: Payer: Self-pay | Admitting: Internal Medicine

## 2010-01-04 ENCOUNTER — Encounter: Payer: Self-pay | Admitting: Internal Medicine

## 2010-01-08 ENCOUNTER — Telehealth: Payer: Self-pay | Admitting: Internal Medicine

## 2010-01-15 ENCOUNTER — Ambulatory Visit (HOSPITAL_BASED_OUTPATIENT_CLINIC_OR_DEPARTMENT_OTHER): Admission: RE | Admit: 2010-01-15 | Discharge: 2010-01-15 | Payer: Self-pay | Admitting: Urology

## 2010-01-22 ENCOUNTER — Telehealth: Payer: Self-pay | Admitting: Internal Medicine

## 2010-01-27 ENCOUNTER — Encounter: Payer: Self-pay | Admitting: Internal Medicine

## 2010-01-29 ENCOUNTER — Telehealth: Payer: Self-pay | Admitting: Internal Medicine

## 2010-02-02 ENCOUNTER — Telehealth: Payer: Self-pay | Admitting: Internal Medicine

## 2010-04-06 ENCOUNTER — Ambulatory Visit: Payer: Self-pay | Admitting: Internal Medicine

## 2010-05-11 ENCOUNTER — Encounter: Payer: Self-pay | Admitting: Internal Medicine

## 2010-05-18 ENCOUNTER — Encounter: Payer: Self-pay | Admitting: Internal Medicine

## 2010-05-20 ENCOUNTER — Ambulatory Visit: Payer: Self-pay | Admitting: Internal Medicine

## 2010-06-19 ENCOUNTER — Encounter: Payer: Self-pay | Admitting: Internal Medicine

## 2010-06-19 ENCOUNTER — Encounter: Payer: Self-pay | Admitting: Obstetrics and Gynecology

## 2010-06-20 ENCOUNTER — Encounter: Payer: Self-pay | Admitting: Internal Medicine

## 2010-06-20 ENCOUNTER — Encounter: Payer: Self-pay | Admitting: Obstetrics and Gynecology

## 2010-06-27 LAB — HM DEXA SCAN

## 2010-07-01 NOTE — Miscellaneous (Signed)
Summary: Injection Record / [facility]    Injection Record / [facility]    Imported By: Lennie Odor 10/20/2009 15:36:09  _____________________________________________________________________  External Attachment:    Type:   Image     Comment:   External Document

## 2010-07-01 NOTE — Letter (Signed)
Summary: [facility]  [facility]   Imported By: Lester Hudson 06/23/2009 15:04:51  _____________________________________________________________________  External Attachment:    Type:   Image     Comment:   External Document

## 2010-07-01 NOTE — Progress Notes (Signed)
Summary: REFILL - Promethazine  Phone Note Refill Request Message from:  Fax from Pharmacy on November 23, 2009 1:42 PM  Refills Requested: Medication #1:  PROMETHAZINE HCL 25 MG  TABS take 1/2- 1 tablet every 6 hours as needed for nausea Please Advise refill  Initial call taken by: Ami Bullins CMA,  November 23, 2009 1:42 PM  Follow-up for Phone Call        Patient is requesting a call back when complete.........Marland KitchenLamar Sprinkles, CMA  November 23, 2009 1:51 PM   Additional Follow-up for Phone Call Additional follow up Details #1::        patient not seen since 2009! Last Rx 10/23/09 by Dr. Gerarda Fraction. Please direct to Dr. Maple Hudson Additional Follow-up by: Jacques Navy MD,  November 23, 2009 5:54 PM

## 2010-07-01 NOTE — Letter (Signed)
Summary: [facility]   Imported By: Sherian Rein 12/25/2009 12:06:27  _____________________________________________________________________  External Attachment:    Type:   Image     Comment:   External Document

## 2010-07-01 NOTE — Miscellaneous (Signed)
Summary: Injection Record / [facility]    Injection Record / [facility]    Imported By: Lennie Odor 10/20/2009 15:07:50  _____________________________________________________________________  External Attachment:    Type:   Image     Comment:   External Document

## 2010-07-01 NOTE — Progress Notes (Signed)
Summary: REFILL  Phone Note Refill Request   Refills Requested: Medication #1:  PROMETHAZINE HCL 25 MG  TABS take 1/2- 1 tablet every 6 hours as needed for nausea Patient is requesting refill, Pulmonary has been filling medication but they will no longer fill med. Pt scheduled for physical in august. Please advise.   Initial call taken by: Lamar Sprinkles, CMA,  November 27, 2009 3:21 PM  Follow-up for Phone Call        OK for #30. eScribed to pharmacy. Follow-up by: Jacques Navy MD,  November 27, 2009 4:56 PM  Additional Follow-up for Phone Call Additional follow up Details #1::        Pt informed  Additional Follow-up by: Lamar Sprinkles, CMA,  November 27, 2009 4:59 PM    Prescriptions: PROMETHAZINE HCL 25 MG  TABS (PROMETHAZINE HCL) take 1/2- 1 tablet every 6 hours as needed for nausea  #30 x 0   Entered and Authorized by:   Jacques Navy MD   Signed by:   Jacques Navy MD on 11/27/2009   Method used:   [facility]        [facility] [phone][facility] (retail[facility]       [address]       [place], [place]  [address]       Ph: [phone] or [phone]       Fax: [phone]   RxID:   [id number]

## 2010-07-01 NOTE — Progress Notes (Signed)
Summary: pt out of meds  Phone Note Call from Patient   Summary of Call: Patient need rx's refaxed to mail order pharmacy, they did not get the ones that were faxed on the 30th. Spent > 15 minutes on the phone with pharmacy on hold. Will refax.  Initial call taken by: Lamar Sprinkles, CMA,  January 29, 2010 2:15 PM  Follow-up for Phone Call        Pt called to get status of her prescriptions. Please call pt to let her know status. Pt completely ouf of 4 medicines and will be out of more soon. Follow-up by: Verdell Face,  February 02, 2010 9:07 AM  Additional Follow-up for Phone Call Additional follow up Details #1::        10 min on hold - we have member # not Myrakle Wingler #. Was given 1 [phone]. They confirmed that rx's were recieved. Left vm for patient that pharm has rx.  Additional Follow-up by: Lamar Sprinkles, CMA,  February 02, 2010 11:39 AM

## 2010-07-01 NOTE — Letter (Signed)
Summary: [facility]   Imported By: Sherian Rein 08/24/2009 13:32:50  _____________________________________________________________________  External Attachment:    Type:   Image     Comment:   External Document

## 2010-07-01 NOTE — Assessment & Plan Note (Signed)
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 Pt sees Dr in [place] ?'s name.   Primary Care Provider:  Norins  CC:  pt here for cpx/ ab.  History of Present Illness: [name] is a 70 yo Caucasian F who presents for preoperative clearance. The patient is scheduled for an August 19th out-surgery to correct her urinary incontinence and placement of an estrogen-secreting device by Dr. Patsi Sears .     Preventive Screening-Counseling & Management  Caffeine-Diet-Exercise     Caffeine use/day: 2 to 3 drinks per day  Hep-HIV-STD-Contraception     Dental Visit-last 6 months no     Sun Exposure-Excessive: no  Safety-Violence-Falls     Fall Risk: one fall in past year-no injury      Drug Use:  never.    Current Medications (verified): 1)  Topamax 200 Mg  Tabs (Topiramate) .... Take 1 By Mouth Two Times A Day Qd 2)  Atenolol 50 Mg  Tabs (Atenolol) .... Take 1/2 Tab  By Mouth Qd 3)  Lorazepam 0.5 Mg  Tabs (Lorazepam) .Marland Kitchen.. 1 Three Times A Day 4)  Levoxyl 175 Mcg  Tabs (Levothyroxine Sodium) .... Takew 1 By Mouth Qd 5)  Adult Aspirin Low Strength 81 Mg  Tbdp (Aspirin) .... Take 1 By Mouth Qd 6)  Lovastatin 40 Mg  Tabs (Lovastatin) .... Take 1 Po Qd 7)  Fosamax 70 Mg Tabs (Alendronate Sodium) .... Weekly 8)  Meclizine Hcl 25 Mg Tabs (Meclizine Hcl) .... Take 1 Tablet By Mouth Every 6 Hours As Needed 9)   Alavert 10 Mg Tabs (Loratadine) .... Take 1 Tablet By Mouth Once A Day 10)  Voltaren 1 %  Gel (Diclofenac Sodium) .... Use As Directed 11)  Stool Softner .... of Choice-2 By Mouth At Bedtime 12)  Souble Fiber Therapy .Marland Kitchen.. 3 By Mouth At Bedtime 13)  Calcium 600 + D 600-200 Mg-Unit  Tabs (Calcium Carbonate-Vitamin D) .... Take 2 Tablet By Mouth Once A Day 14)  Multivitamins   Tabs (Multiple Vitamin) .... Take 1 Tablet By Mouth Once A Day 15)  Vitamin E 400 Unit  Caps (Vitamin E) .... Take 4 Tablet By Mouth Once A Day 16)  Promethazine Hcl 25 Mg  Tabs (Promethazine Hcl) .... Take 1/2- 1 Tablet Every 6 Hours As Needed For Nausea 17)  Imodium Advanced 2-125 Mg Tabs (Loperamide-Simethicone) .... As Directed As Needed 18)  Vitamin D 1000 Unit Tabs (Cholecalciferol) .... Take 2 Tablet By Mouth Once A Day 19)  Gabapentin 100 Mg Caps (Gabapentin) .... Take 1 Tablet By Mouth Four Times A Day 20)  Fluoxetine Hcl 20 Mg Caps (Fluoxetine Hcl) .... Take  1 Tablet By Mouth Once A Day 21)  Vicodin 5-500 Mg Tabs (Hydrocodone-Acetaminophen) .... Take 1 Every 4-6 Hours As Needed Pain 22)  Amoxicillin 500 Mg Caps (Amoxicillin) .... Prioir To Dental Work 23)  Prilosec Otc 20 Mg Tbec (Omeprazole Magnesium) .... Take 2 By Mouth  Once Daily 24)  Vitamin C 500 Mg Tabs (Ascorbic Acid) .... Take 1 By Mouth Once Daily 25)  Macrodantin 100 Mg Caps (Nitrofurantoin Macrocrystal) .... Take As Needed Uti  Allergies (verified): 1)  ! Celebrex 2)  ! Pyridium 3)  ! * Glucosamine 4)  ! Sulfa 5)  ! Niacin 6)  ! Trovan 7)  ! Reglan 8)  ! * Trilaforn 9)  ! * Histanex 10)  ! 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) DEPRESSION (ICD-311) EUSTACHIAN TUBE DYSFUNCTION (ICD-381.81) TARDIVE DYSKINESIA (ICD-333.82) DYSPNEA (ICD-786.05) MITRAL VALVE PROLAPSE (ICD-424.0) OSTEOPOROSIS (ICD-733.00) GERD (ICD-530.81) ALLERGIC RHINITIS (ICD-477.9) GLAUCOMA (ICD-365.9) HIATAL HERNIA (ICD-553.3) HYPOTHYROIDISM (ICD-244.9) HYPERTENSION (ICD-401.9) HYPERLIPIDEMIA (ICD-272.4) ANXIETY (ICD-300.00)    Family History: Last updated: 06/10/08 father - deceased 72's: alzheimer's, CAD mother - deceased 49's: breast cancer with metastatic disease MAunt - DM Family History of Prostate Cancer:Paternal Uncle  Social History: Last updated: 04/22/2008 [employer], [employer] school married : '59- '73, widowed; Married '77- 64yrs/divorced. 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 Patient never smoked.   Past Surgical History: (L) Knee 4 surgeries 1 Baker cyst removed Had surgery to her (R) index finger Dilation and curettage x 3 Lumbar spine Back Surg 07/13/2009 Dr Ophelia Charter  Social History: Drug Use:  never Dental Care w/in 6 mos.:  no Sun Exposure-Excessive:  no Fall Risk:  one fall in past year-no injury Alcohol:  None Caffeine use/day:  2 to 3 drinks per day  Review of Systems       The patient complains of anorexia, chest pain, dyspnea on exertion, headaches, abdominal pain, and muscle weakness.  The patient denies fever, vision loss, decreased hearing, hoarseness, syncope, prolonged cough, hemoptysis, melena, hematochezia, depression, unusual weight change, and abnormal bleeding.         positive - chills, night sweats, productive cough w/ yellow to black sputum, dry skin  negative - blurred vision, epistaxis, dysuria, frequency, hematuria, muscle cramps, tremor, spasm, seizure, memory loss, anxiety  Physical Exam  General:  alert and normal appearance.   Head:  normocephalic, atraumatic, and no abnormalities observed.   Eyes:  pupils equal, pupils  round, pupils reactive to light, and corneas and lenses clear.   Ears:  R ear normal, L ear normal, and no external deformities.   Nose:  no external deformity and no external erythema.   Mouth:  no gingival abnormalities and mild pharyngeal erythema.   Neck:  supple and no masses.   Chest Wall:  no deformities, no tenderness, and no mass.   Lungs:  normal respiratory effort, no accessory muscle use, normal breath sounds, no crackles, and no wheezes.   Heart:  regular rhythm, no gallop, and no rub.   Abdomen:  diffuse abdominal tenderness on light palpation. Msk:  no joint tenderness and no joint swelling.   Pulses:  L radial normal and R radial decreased.   Neurologic:  strength normal in all extremities, Romberg negative, and abnormal gait.  Skin:  turgor normal, color normal, and no rashes.   Psych:  Oriented X3, normally interactive, good eye contact, and slight memory impairment.     Impression & Recommendations:  Problem # 1:  PREOPERATIVE EXAMINATION (ICD-V72.84) assessment - Patient has a variety of chronic medical complaints though her pulmonary and cardiac function are stable on exam.   plan -    Patient with moderately increased anesthesia risk due to her multiple medical problems and drug allergies, but these complications are not prohibitive. Will be happy to assist with any medical problems should they arise.   Problem # 2:  DIARRHEA, CHRONIC (ICD-787.91) assessment - Patient has a long history of diarrhea though since a February 2011 post-operative infection was treated with antibiotics her                        diarrhea has increased in frequency to 2-3x/day. With a change in her diarrhea pattern concern is raised for C. Diff colitis. The                       patient does deny presence of blood or mucus in the stool. She denies lower abdominal pain.   plan -               Orders:                         T-Culture, C-Diff Toxin A/B ([id number])  Complete Medication  List: 1)  Topamax 200 Mg Tabs (Topiramate) .... Take 1 by mouth two times a day qd 2)  Atenolol 50 Mg Tabs (Atenolol) .... Take 1/2 tab  by mouth qd 3)  Lorazepam 0.5 Mg Tabs (Lorazepam) .Marland Kitchen.. 1 three times a day 4)  Levoxyl 175 Mcg Tabs (Levothyroxine sodium) .... Takew 1 by mouth qd 5)  Adult Aspirin Low Strength 81 Mg Tbdp (Aspirin) .... Take 1 by mouth qd 6)  Lovastatin 40 Mg Tabs (Lovastatin) .... Take 1 po qd 7)  Fosamax 70 Mg Tabs (Alendronate sodium) .... Weekly 8)  Meclizine Hcl 25 Mg Tabs (Meclizine hcl) .... Take 1 tablet by mouth every 6 hours as needed 9)  Alavert 10 Mg Tabs (Loratadine) .... Take 1 tablet by mouth once a day 10)  Voltaren 1 % Gel (Diclofenac sodium) .... Use as directed 11)  Stool Softner  .... Of choice-2 by mouth at bedtime 12)  Souble Fiber Therapy  .Marland Kitchen.. 3 by mouth at bedtime 13)  Calcium 600 + D 600-200 Mg-unit Tabs (Calcium carbonate-vitamin d) .... Take 2 tablet by mouth once a day 14)  Multivitamins Tabs (Multiple vitamin) .... Take 1 tablet by mouth once a day 15)  Vitamin E 400 Unit Caps (Vitamin e) .... Take 4 tablet by mouth once a day 16)  Promethazine Hcl 25 Mg Tabs (Promethazine hcl) .... Take 1/2- 1 tablet every 6 hours as needed for nausea 17)  Imodium Advanced 2-125 Mg Tabs (Loperamide-simethicone) .... As directed as needed 18)  Vitamin D 1000 Unit Tabs (Cholecalciferol) .... Take 2 tablet by mouth once a day 19)  Gabapentin 100 Mg Caps (Gabapentin) .... Take 1 tablet by mouth four times a day 20)  Fluoxetine Hcl 20 Mg Caps (Fluoxetine hcl) .... Take 1 tablet by mouth once a day 21)  Vicodin 5-500 Mg Tabs (Hydrocodone-acetaminophen) .... Take 1 every 4-6 hours as needed pain 22)  Amoxicillin 500 Mg Caps (Amoxicillin) .... Prioir to  dental work 23)  Prilosec Otc 20 Mg Tbec (Omeprazole magnesium) .... Take 2 by mouth  once daily 24)  Vitamin C 500 Mg Tabs (Ascorbic acid) .... Take 1 by mouth once daily 25)  Macrodantin 100 Mg Caps (Nitrofurantoin  macrocrystal) .... Take as needed uti

## 2010-07-01 NOTE — Assessment & Plan Note (Signed)
Summary: PINK EYE? /NWS   Vital Signs:  Patient profile:   70 year old female Height:      63 inches Weight:      159 pounds BMI:     28.27 O2 Sat:      97 % on Room air Temp:     98.3 degrees F oral Pulse rate:   71 / minute BP sitting:   118 / 70  (left arm) Cuff size:   regular  Vitals Entered By: Bill Salinas CMA (May 20, 2010 11:31 AM)  O2 Flow:  Room air CC: pt here for evaluation of redness/ itching in her right eye. Pt has been using systane eye drops with a little relief/ ab   Primary Care Provider:  Christel Bai  CC:  pt here for evaluation of redness/ itching in her right eye. Pt has been using systane eye drops with a little relief/ ab.  History of Present Illness: Patient presents with a complaint of a red and scratchy right eye. She reports that 5 days ago she had a "blood red" eye involving the conjunctiva. She also had some itching discomfort. She has self-treated with systane eye drops and "ocu-soft" cloths tht she used to clean her eye. No change or loss of vision.  She c/o sore throat that is mild for which she has been using scope. she has had no fever, no myalgias, no odynophagia.    Current Medications (verified): 1)  Topamax 200 Mg  Tabs (Topiramate) .... Take 1 By Mouth Two Times A Day Qd 2)  Atenolol 50 Mg  Tabs (Atenolol) .... Take 1/2 Tab  By Mouth Qd 3)  Lorazepam 0.5 Mg  Tabs (Lorazepam) .Marland Kitchen.. 1 Three Times A Day 4)  Levoxyl 175 Mcg  Tabs (Levothyroxine Sodium) .... Takew 1 By Mouth Qd 5)  Adult Aspirin Low Strength 81 Mg  Tbdp (Aspirin) .... Take 1 By Mouth Qd 6)  Lovastatin 40 Mg  Tabs (Lovastatin) .... Take 1 Po Qd 7)  Fosamax 70 Mg Tabs (Alendronate Sodium) .... Weekly 8)  Meclizine Hcl 25 Mg Tabs (Meclizine Hcl) .... Take 1 Tablet By Mouth Every 6 Hours As Needed 9)  Alavert 10 Mg Tabs (Loratadine) .... Take 1 Tablet By Mouth Once A Day 10)  Voltaren 1 %  Gel (Diclofenac Sodium) .... Use As Directed 11)  Stool Softner .... of Choice-2 By Mouth  At Bedtime 12)  Souble Fiber Therapy .Marland Kitchen.. 3 By Mouth At Bedtime 13)  Calcium 600 + D 600-200 Mg-Unit  Tabs (Calcium Carbonate-Vitamin D) .... Take 2 Tablet By Mouth Once A Day 14)  Multivitamins   Tabs (Multiple Vitamin) .... Take 1 Tablet By Mouth Once A Day 15)  Vitamin E 400 Unit  Caps (Vitamin E) .... Take 4 Tablet By Mouth Once A Day 16)  Promethazine Hcl 25 Mg  Tabs (Promethazine Hcl) .... Take 1/2- 1 Tablet Every 6 Hours As Needed For Nausea 17)  Imodium Advanced 2-125 Mg Tabs (Loperamide-Simethicone) .... As Directed As Needed 18)  Vitamin D 1000 Unit Tabs (Cholecalciferol) .... Take 2 Tablet By Mouth Once A Day 19)  Gabapentin 100 Mg Caps (Gabapentin) .... Take 1 Tablet By Mouth Four Times A Day 20)  Fluoxetine Hcl 20 Mg Caps (Fluoxetine Hcl) .... Take 1 Tablet By Mouth Once A Day 21)  Vicodin 5-500 Mg Tabs (Hydrocodone-Acetaminophen) .... Take 1 Every 4-6 Hours As Needed Pain 22)  Amoxicillin 500 Mg Caps (Amoxicillin) .... Prioir To Dental Work 23)  Prilosec Otc  20 Mg Tbec (Omeprazole Magnesium) .... Take 2 By Mouth  Once Daily 24)  Vitamin C 500 Mg Tabs (Ascorbic Acid) .... Take 1 By Mouth Once Daily 25)  Macrodantin 100 Mg Caps (Nitrofurantoin Macrocrystal) .... Take As Needed Uti 26)  Gelnique 10 % Gel (Oxybutynin Chloride) 27)  Estring 2 Mg Ring (Estradiol)  Allergies (verified): 1)  ! Celebrex 2)  ! Pyridium 3)  ! * Glucosamine 4)  ! Sulfa 5)  ! Niacin 6)  ! Trovan 7)  ! Reglan 8)  ! * Trilaforn 9)  ! * Histanex 10)  ! 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) DEPRESSION (ICD-311) EUSTACHIAN TUBE DYSFUNCTION  (ICD-381.81) TARDIVE DYSKINESIA (ICD-333.82) DYSPNEA (ICD-786.05) MITRAL VALVE PROLAPSE (ICD-424.0) OSTEOPOROSIS (ICD-733.00) GERD (ICD-530.81) ALLERGIC RHINITIS (ICD-477.9) GLAUCOMA (ICD-365.9) HIATAL HERNIA (ICD-553.3) HYPOTHYROIDISM (ICD-244.9) HYPERTENSION (ICD-401.9) HYPERLIPIDEMIA (ICD-272.4) ANXIETY (ICD-300.00)    Past Surgical History: Last updated: 01/01/2010 (L) Knee 4 surgeries 1 Baker cyst removed Had surgery to her (R) index finger Dilation and curettage x 3 Lumbar spine Back Surg 07/13/2009 Dr Ophelia Charter FH reviewed for relevance, SH/Risk Factors reviewed for relevance  Review of Systems  The patient denies anorexia, fever, weight loss, weight gain, vision loss, decreased hearing, chest pain, syncope, dyspnea on exertion, headaches, abdominal pain, hematuria, muscle weakness, difficulty walking, and enlarged lymph nodes.    Physical Exam  General:  alert, well-developed, well-nourished, well-hydrated, and normal appearance.   Head:  normocephalic and atraumatic.   Eyes:  bulbar and palpebral conjunctiva is clear OD; lens and pupil normal. Visual acuity grossly normal Mouth:  throat without erythema or exudate. Lungs:  normal respiratory effort and normal breath sounds.   Heart:  normal rate and regular rhythm.   Neurologic:  alert & oriented X3, cranial nerves II-XII intact, and gait normal.   Skin:  turgor normal and color normal.   Cervical Nodes:  no anterior cervical adenopathy and no posterior cervical adenopathy.   Psych:  Oriented X3, normally interactive, and good eye contact.     Impression & Recommendations:  Problem # 1:  SUBCONJUNCTIVAL HEMORRHAGE, RIGHT (ICD-372.72) By patients description of a blood red eye this is the most likely diagnosis. She has no evidence on exam of persistent or bacterial conjunctivitis.  Plan - supportive care.            [place] opthal or Dr. Dione Booze for local ophtalmology care if she wishes to change from Dr.  Katrinka Blazing in [place]  Complete Medication List: 1)  Topamax 200 Mg Tabs (Topiramate) .... Take 1 by mouth two times a day qd 2)  Atenolol 50 Mg Tabs (Atenolol) .... Take 1/2 tab  by mouth qd 3)  Lorazepam 0.5 Mg Tabs (Lorazepam) .Marland Kitchen.. 1 three times a day 4)  Levoxyl 175 Mcg Tabs (Levothyroxine sodium) .... Takew 1 by mouth qd 5)  Adult Aspirin Low Strength 81 Mg Tbdp (Aspirin) .... Take 1 by mouth qd 6)  Lovastatin 40 Mg Tabs (Lovastatin) .... Take 1 po qd 7)  Fosamax 70 Mg Tabs (Alendronate sodium) .... Weekly 8)  Meclizine Hcl 25 Mg Tabs (Meclizine hcl) .... Take 1 tablet by mouth every 6 hours as needed 9)  Alavert 10 Mg Tabs (Loratadine) .... Take 1 tablet by mouth once a day 10)  Voltaren 1 %  Gel (Diclofenac sodium) .... Use as directed 11)  Stool Softner  .... Of choice-2 by mouth at bedtime 12)  Souble Fiber Therapy  .Marland Kitchen.. 3 by mouth at bedtime 13)  Calcium 600 + D 600-200 Mg-unit Tabs (Calcium carbonate-vitamin d) .... Take 2 tablet by mouth once a day 14)  Multivitamins Tabs (Multiple vitamin) .... Take 1 tablet by mouth once a day 15)  Vitamin E 400 Unit Caps (Vitamin e) .... Take 4 tablet by mouth once a day 16)  Promethazine Hcl 25 Mg Tabs (Promethazine hcl) .... Take 1/2- 1 tablet every 6 hours as needed for nausea 17)  Imodium Advanced 2-125 Mg Tabs (Loperamide-simethicone) .... As directed as needed 18)  Vitamin D 1000 Unit Tabs (Cholecalciferol) .... Take 2 tablet by mouth once a day 19)  Gabapentin 100 Mg Caps (Gabapentin) .... Take 1 tablet by mouth four times a day 20)  Fluoxetine Hcl 20 Mg Caps (Fluoxetine hcl) .... Take 1 tablet by mouth once a day 21)  Vicodin 5-500 Mg Tabs (Hydrocodone-acetaminophen) .... Take 1 every 4-6 hours as needed pain 22)  Amoxicillin 500 Mg Caps (Amoxicillin) .... Prioir to dental work 23)  Prilosec Otc 20 Mg Tbec (Omeprazole magnesium) .... Take 2 by mouth  once daily 24)  Vitamin C 500 Mg Tabs (Ascorbic acid) .... Take 1 by mouth once daily 25)   Macrodantin 100 Mg Caps (Nitrofurantoin macrocrystal) .... Take as needed uti 26)  Gelnique 10 % Gel (Oxybutynin chloride) 27)  Estring 2 Mg Ring (Estradiol)  Appended Document: PINK EYE? /NWS    Clinical Lists Changes  Orders: Added new Service order of Est. Patient Level III ([id number]) - Signed

## 2010-07-01 NOTE — Progress Notes (Signed)
  Phone Note Refill Request Message from:  Fax from Pharmacy on February 02, 2010 4:25 PM  Refills Requested: Medication #1:  PROMETHAZINE HCL 25 MG  TABS take 1/2- 1 tablet every 6 hours as needed for nausea please Advise refill  Initial call taken by: Ami Bullins CMA,  February 02, 2010 4:25 PM  Follow-up for Phone Call        ok for refill x 2 Follow-up by: Jacques Navy MD,  February 02, 2010 5:39 PM    Prescriptions: PROMETHAZINE HCL 25 MG  TABS (PROMETHAZINE HCL) take 1/2- 1 tablet every 6 hours as needed for nausea  #30 x 2   Entered by:   Bill Salinas CMA   Authorized by:   Jacques Navy MD   Signed by:   Bill Salinas CMA on 02/03/2010   Method used:   [phone] (retail[facility]       [address]       [place], [place]  [address]       Ph: [phone] or [phone]       Fax: [phone]   RxID:   [id number]

## 2010-07-01 NOTE — Letter (Signed)
Summary: [facility]   Imported By: Lester Arapahoe 05/27/2010 11:31:21  _____________________________________________________________________  External Attachment:    Type:   Image     Comment:   External Document

## 2010-07-01 NOTE — Letter (Signed)
Summary: [facility]   Imported By: Lennie Odor 05/17/2010 11:37:19  _____________________________________________________________________  External Attachment:    Type:   Image     Comment:   External Document

## 2010-07-01 NOTE — Assessment & Plan Note (Signed)
Summary: flu shot-lb  Nurse Visit   Vital Signs:  Patient profile:   70 year old female Temp:     97.8 degrees F oral  Vitals Entered By: Lanier Prude, [facility]) (April 06, 2010 1:57 PM)  Allergies: 1)  ! Celebrex 2)  ! Pyridium 3)  ! * Glucosamine 4)  ! Sulfa 5)  ! Niacin 6)  ! Trovan 7)  ! Reglan 8)  ! * Trilaforn 9)  ! * Histanex 10)  ! * Vagisil  Orders Added: 1)  Flu Vaccine 86yrs + MEDICARE PATIENTS [Q2039] 2)  Administration Flu vaccine - MCR [G0008]        Flu Vaccine Consent Questions     Do you have a history of severe allergic reactions to this vaccine? no    Any prior history of allergic reactions to egg and/or gelatin? no    Do you have a sensitivity to the preservative Thimersol? no    Do you have a past history of Guillan-Barre Syndrome? no    Do you currently have an acute febrile illness? no    Have you ever had a severe reaction to latex? no    Vaccine information given and explained to patient? yes    Are you currently pregnant? no    Lot Number:[id number]   Exp Date:11/27/2010   Site Given  Left Deltoid IM Lanier Prude, [facility])  April 06, 2010 1:57 PM

## 2010-07-01 NOTE — Assessment & Plan Note (Signed)
Summary: 6 months/ mbw   Primary Provider/Referring Provider:  Norins  CC:  Follow up visit-allergies..  History of Present Illness: 08/05/08- allergic rhinitis, chronic rhinosinusits, bronchitis Got nosebleed rinsing her nose with saline before she brushed her teeth. Couldn't tell which side of nose bled. Dentures were out. Subsequent upper endoscopy went ok. Occasional epistaxis since then, but no other bleeding. On aspirin.   02/03/09- Allergic rhinitis, chronic rhinosinusitis, bronchitis Considering lumbar spine surgery- Dr Ophelia Charter. She still rinses daily with syringe, making her own saline nasal rinse. Still productive nasal  greenish brown. Gets frontal and bitemporal headaches. Ears sometimes ache and pop. Denies fever, blood, cough or wheeze.  June 05, 2009- Allergic rhinitis, chronic rhinosinusitis, bronchitis Pending back surgery, myelogram and CT of spine. On vicodin for pain. She worries about her sinuses duding the surgery since she now rinses with saline 3-4 x/day for "clogging up" and won't be able to do that for a while after surgery.. Continues allergy vaccine without problems. Hasn't tried doing without Alavert. CT sinus - no sinusitis, some septal deviation. Taking ampicillin for dental work before her back surgery.  December 04, 2009- Allergic rhinitis, chronic rhinosinusitis, bronchitis She had been in rehab and then housebound after back surgery in February. Says she had wound infection then.. Allergy vaccine stopped then. Recent sore throat felt down into stomach. She has had persistent nausea, recurrent loose stools. Had been on prolonged antibiotics. Has not had headache or significant sinus drainage to suggest a relapse of her chronic rhinosinusitis. Denies nasal drainage, cough or wheeze.   Preventive Screening-Counseling & Management  Alcohol-Tobacco     Smoking Status: never  Current Medications (verified): 1)  Topamax 200 Mg  Tabs (Topiramate) .... Take 1 By  Mouth Two Times A Day Qd 2)  Atenolol 50 Mg  Tabs (Atenolol) .... Take 1/2 Tab  By Mouth Qd 3)  Lorazepam 0.5 Mg  Tabs (Lorazepam) .Marland Kitchen.. 1 Three Times A Day 4)  Levoxyl 175 Mcg  Tabs (Levothyroxine Sodium) .... Takew 1 By Mouth Qd 5)  Adult Aspirin Low Strength 81 Mg  Tbdp (Aspirin) .... Take 1 By Mouth Qd 6)  Lovastatin 40 Mg  Tabs (Lovastatin) .... Take 1 Po Qd 7)  Fosamax 70 Mg Tabs (Alendronate Sodium) .... Weekly 8)  Meclizine Hcl 25 Mg Tabs (Meclizine Hcl) .... Take 1 Tablet By Mouth Every 6 Hours As Needed 9)  Alavert 10 Mg Tabs (Loratadine) .... Take 1 Tablet By Mouth Once A Day 10)  Voltaren 1 %  Gel (Diclofenac Sodium) .... Use As Directed 11)  Stool Softner .... of Choice-2 By Mouth At Bedtime 12)  Souble Fiber Therapy .Marland Kitchen.. 3 By Mouth At Bedtime 13)  Allergy Vaccine 1:10 Gh .... Build As Tolerated 14)  Calcium 600 + D 600-200 Mg-Unit  Tabs (Calcium Carbonate-Vitamin D) .... Take 2 Tablet By Mouth Once A Day 15)  Multivitamins   Tabs (Multiple Vitamin) .... Take 1 Tablet By Mouth Once A Day 16)  Vitamin E 400 Unit  Caps (Vitamin E) .... Take 4 Tablet By Mouth Once A Day 17)  Promethazine Hcl 25 Mg  Tabs (Promethazine Hcl) .... Take 1/2- 1 Tablet Every 6 Hours As Needed For Nausea 18)  Imodium Advanced 2-125 Mg Tabs (Loperamide-Simethicone) .... As Directed As Needed 19)  Vitamin D 1000 Unit Tabs (Cholecalciferol) .... Take 2 Tablet By Mouth Once A Day 20)  Gabapentin 100 Mg Caps (Gabapentin) .... Take 1 Tablet By Mouth Four Times A Day 21)  Fluoxetine Hcl 20 Mg Caps (Fluoxetine Hcl) .... Take 1 Tablet By Mouth Once A Day 22)  Vicodin 5-500 Mg Tabs (Hydrocodone-Acetaminophen) .... Take 1 Every 4-6 Hours As Needed Pain 23)  Amoxicillin 500 Mg Caps (Amoxicillin) .... Prioir To Dental Work 24)  Prilosec Otc 20 Mg Tbec (Omeprazole Magnesium) .... Take 2 By Mouth  Once Daily 25)  Vitamin C 500 Mg Tabs (Ascorbic Acid) .... Take 1 By Mouth Once Daily 26)  Macrodantin 100 Mg Caps  (Nitrofurantoin Macrocrystal) .... Take As Needed Uti  Allergies (verified): 1)  ! Celebrex 2)  ! Pyridium 3)  ! * Glucosamine 4)  ! Sulfa 5)  ! Niacin 6)  ! Trovan 7)  ! Reglan 8)  ! * Trilaforn 9)  ! * Histanex 10)  ! 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 Patient never smoked.   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) DEPRESSION (ICD-311) EUSTACHIAN TUBE DYSFUNCTION (ICD-381.81) TARDIVE DYSKINESIA (ICD-333.82) DYSPNEA (ICD-786.05) MITRAL VALVE PROLAPSE (ICD-424.0) OSTEOPOROSIS (ICD-733.00) GERD (ICD-530.81) ALLERGIC RHINITIS (ICD-477.9) GLAUCOMA (ICD-365.9) HIATAL HERNIA (ICD-553.3) HYPOTHYROIDISM (ICD-244.9) HYPERTENSION (ICD-401.9) HYPERLIPIDEMIA (ICD-272.4) ANXIETY (ICD-300.00)    Past Surgical History: (L) Knee 4 surgeries 1 Baker cyst removed Had surgery to her (R) index finger Dilation and curettage x 3 Lumbar spine  Review of Systems      See HPI  The patient denies shortness of breath with activity, shortness of breath at rest, productive cough, non-productive cough, coughing up blood, chest pain, irregular heartbeats, acid heartburn, indigestion, loss of appetite, weight change,  abdominal pain, difficulty swallowing, sore throat, tooth/dental problems, headaches, nasal congestion/difficulty breathing through nose, and sneezing.    Vital Signs:  Patient profile:   70 year old female Height:      63 inches Weight:      156 pounds BMI:     27.73 O2 Sat:      96 % on Room air Pulse rate:   83 / minute BP sitting:   132 / 84  (left arm) Cuff size:   regular  Vitals Entered By: Reynaldo Minium CMA (December 04, 2009 10:23 AM)  O2 Flow:  Room air CC: Follow up visit-allergies.   Physical Exam  Additional Exam:  General: A/Ox3; pleasant and cooperative, NAD, SKIN: no rash, lesions NODES: no lymphadenopathy HEENT: /AT, EOM- WNL, Conjuctivae- clear, PERRLA, TM-WNL( some cerumen on L), Nose-pale edema, narrower on right. Throat- clear and wnl- no post nasal drip seen this visit, dentures, Mallampati  II NECK: Supple w/ fair ROM, JVD- none, normal carotid impulses w/o bruits Thyroid- no stridor CHEST: Clear to P&A, unlabored. Wearing back brace HEART: RRR, no m/g/r heard ABDOMEN: Soft and nl; WJX:BJYN, nl pulses, no edema  NEURO: Grossly intact to observation      Impression & Recommendations:  Problem # 1:  RHINOSINUSITIS, CHRONIC (ICD-473.8) Asymptomatic and clinically in remission now.  Problem # 2:  ALLERGIC RHINITIS (ICD-477.9)  Not active  now, perhaps beause she has been indoors. She has been off allergy vaccine since February. We are stopping her shots. we can reassess in the future if needed. Her updated medication list for this problem includes:    Alavert 10 Mg Tabs (Loratadine) .Marland Kitchen... Take 1 tablet by mouth once a day    Promethazine Hcl 25 Mg Tabs (Promethazine hcl) .Marland Kitchen... Take 1/2- 1 tablet every 6 hours as needed for nausea  Problem # 3:  GASTROENTERITIS (ICD-558.9) She had had antibiotics after surgery and complaint of nausea with diarrhea is best addressed by Primary Care when she sees Dr Debby Bud soon for physical. I declined her repeated  requests for promethazine to treat this. Her updated medication list for this problem includes:    Meclizine Hcl 25 Mg Tabs (Meclizine hcl) .Marland Kitchen... Take 1 tablet by mouth every 6 hours as needed    Imodium Advanced 2-125 Mg Tabs (Loperamide-simethicone) .Marland Kitchen... As directed as needed  Medications Added to Medication List This Visit: 1)  Fosamax 70 Mg Tabs (Alendronate sodium) .... Weekly 2)  Macrodantin 100 Mg Caps (Nitrofurantoin macrocrystal) .... Take as needed uti  Other Orders: Est. Patient Level III (04540)  Patient Instructions: 1)  Please schedule a follow-up appointment as needed. 2)  We are stopping your allergy shots. I would happy to see you again if you need.

## 2010-07-01 NOTE — Progress Notes (Signed)
  Phone Note Refill Request Message from:  Fax from Pharmacy on January 22, 2010 11:15 AM  Refills Requested: Medication #1:  LEVOXYL 175 MCG  TABS takew 1 by mouth qd  Medication #2:  LOVASTATIN 40 MG  TABS take 1 po qd  Medication #3:  ATENOLOL 50 MG  TABS take 1/2 tab  by mouth qd  Medication #4:  LORAZEPAM 0.5 MG  TABS 1 three times a day [facility] please Advise refill particul. on the Lorazepam.  Initial call taken by: Ami Bullins CMA,  January 22, 2010 11:16 AM  Follow-up for Phone Call        ok for refills including lorazepam Follow-up by: Jacques Navy MD,  January 22, 2010 5:22 PM  Additional Follow-up for Phone Call Additional follow up Details #1::        [facility] Additional Follow-up by: Ami Bullins CMA,  January 26, 2010 10:53 AM    Prescriptions: LOVASTATIN 40 MG  TABS (LOVASTATIN) take 1 po qd  #90 x 3   Entered by:   Lamar Sprinkles, CMA   Authorized by:   Jacques Navy MD   Signed by:   Lamar Sprinkles, CMA on 01/23/2010   Method used:   Printed then faxed to ...       [facility] (retail)             , [place]         Ph: [phone]       Fax: [phone]   RxID:   [id number] LEVOXYL 175 MCG  TABS (LEVOTHYROXINE SODIUM) takew 1 by mouth qd  #90 x 3   Entered by:   Lamar Sprinkles, CMA   Authorized by:   Jacques Navy MD   Signed by:   Lamar Sprinkles, CMA on 01/23/2010   Method used:   Printed then faxed to ...       [facility] (retail)             , [place]         Ph: [phone]       Fax: [phone]   RxID:   [id number] ATENOLOL 50 MG  TABS (ATENOLOL) take 1/2 tab  by mouth qd  #45 x 3   Entered by:   Lamar Sprinkles, CMA   Authorized by:   Jacques Navy MD   Signed by:   Lamar Sprinkles, CMA on 01/23/2010   Method used:   Printed then faxed to ...       [facility] (retail)             , [place]         Ph: [phone]       Fax: [phone]   RxID:    [id number] LORAZEPAM 0.5 MG  TABS (LORAZEPAM) 1 three times a day  #270 x 1   Entered by:   Lamar Sprinkles, CMA   Authorized by:   Jacques Navy MD   Signed by:   Lamar Sprinkles, CMA on 01/23/2010   Method used:   Printed then faxed to ...       [facility] (retail)             , [place]         Ph: [phone]       Fax: [phone]   RxID:   [id number]

## 2010-07-01 NOTE — Progress Notes (Signed)
Summary: lab results  Phone Note Call from Patient Call back at Home Phone [phone]   Caller: Patient Summary of Call: Patient called requesting to know staus of her lab results. Please advise thanks Initial call taken by: Rock Nephew CMA,  January 08, 2010 3:34 PM  Follow-up for Phone Call        Stol for c. diff was negative. Follow-up by: Jacques Navy MD,  January 08, 2010 4:37 PM  Additional Follow-up for Phone Call Additional follow up Details #1::        Pt informed, she continues to have diarrhea all day 1 day a week. Immodium helps symptoms.  Additional Follow-up by: Lamar Sprinkles, CMA,  January 08, 2010 5:49 PM    Additional Follow-up for Phone Call Additional follow up Details #2::    She should add a bulk laxative to her daily regimen: i.e. metamucil or benefiber, etc. Follow-up by: Jacques Navy MD,  January 09, 2010 3:04 PM  Additional Follow-up for Phone Call Additional follow up Details #3:: Details for Additional Follow-up Action Taken: Patient already uses citracel 3 tabs daily and 2 stool softeners daily. Please advise. .......................Marland KitchenLamar Sprinkles, CMA  January 11, 2010 2:16 PM   Ah Gaylyn Rong - may be over doing it leading to diarrhea. Cut back to ciracel 2 tabs daily, leave off other stool softeners, plenty of dietary fiber and hydrate well. Jacques Navy MD,  January 12, 2010 8:20 AM   pt informed  Additional Follow-up by: Lanier Prude, [facility]),  January 12, 2010 11:42 AM

## 2010-07-01 NOTE — Letter (Signed)
Summary: [facility]   Imported By: Sherian Rein 11/25/2009 09:37:47  _____________________________________________________________________  External Attachment:    Type:   Image     Comment:   External Document

## 2010-07-01 NOTE — Letter (Signed)
Summary: [facility]   Imported By: Lester Sultan 02/03/2010 07:31:05  _____________________________________________________________________  External Attachment:    Type:   Image     Comment:   External Document

## 2010-07-01 NOTE — Letter (Signed)
Summary: [facility]   Imported By: Sherian Rein 12/08/2009 14:00:03  _____________________________________________________________________  External Attachment:    Type:   Image     Comment:   External Document

## 2010-07-01 NOTE — Progress Notes (Signed)
  Phone Note Refill Request Message from:  Fax from Pharmacy on June 08, 2009 9:38 AM  Refills Requested: Medication #1:  LORAZEPAM 0.5 MG  TABS 1 three times a day received [facility] from [facility]. can pt have refill?  Initial call taken by: Ami Bullins CMA,  June 08, 2009 9:39 AM  Follow-up for Phone Call        OK for refill x 5 Follow-up by: Jacques Navy MD,  June 08, 2009 2:30 PM  Additional Follow-up for Phone Call Additional follow up Details #1::        rx on md's desk to be signed Additional Follow-up by: Ami Bullins CMA,  June 08, 2009 3:37 PM    Prescriptions: LORAZEPAM 0.5 MG  TABS (LORAZEPAM) 1 three times a day  #270 x 5   Entered by:   Ami Bullins CMA   Authorized by:   Jacques Navy MD   Signed by:   Bill Salinas CMA on 06/08/2009   Method used:   Printed then faxed to ...       [facility] (retail)             , [place]         Ph:        Fax: [phone]   RxID:   [id number]   Appended Document:  Faxed prescription to [facility].

## 2010-07-01 NOTE — Progress Notes (Signed)
Summary: Dosage verification  Phone Note Call from Patient   Caller: Son-Roger--[phone] Summary of Call: Patient son called to verify the dosage of patient Lorazepam. (Pt had back surgery yesterday). He and the patient RN were unsure b/c the patient had it hand written as 3 by mouth three times a day and no one is able to find the patient prescription bottle. I made him aware that she is to take 1 by mouth three times a day. Per son patient has messed up her dosing  and taken to much of this med for awhile, but will get it all straightened out with patient RN who is with them now. Initial call taken by: Lucious Groves,  July 15, 2009 2:28 PM  Follow-up for Phone Call        go with what is listed onthe med list as you hve done. Thanks Follow-up by: Jacques Navy MD,  July 15, 2009 3:32 PM

## 2010-07-01 NOTE — Assessment & Plan Note (Signed)
Summary: 4 MONTHS/ MBW   Primary Provider/Referring Provider:  Norins  CC:  4 month follow up visit-head congestion and using nasal rinses; due to have back surgery.Marland Kitchen  History of Present Illness: 06/09/08- Allergic rhinitis, chronic rhinosinusitis, bronchitis C/O more nasal discharge "all winter" blowing green and yellow. Frontal headache. Gets up at night to use saline nasal lavage. Anterior chest "sore". Dr Randa Evens scheuled barium swallow. She  avoids salads, spicey foods. Takes prilosec or recent nexium samples. Previous ENT eval told her no sinusitis even when I saw definite purulent looking pn drip. Previous CT head made no mention of sinuses.  08/05/08- allergic rhinitis, chronic rhinosinusits, bronchitis Got nosebleed rinsing her nose with saline before she brushed her teeth. Couldn't tell which side of nose bled. Dentures were out. Subsequent upper endoscopy went ok. Occasional epistaxis since then, but no other bleeding. On aspirin.   02/03/09- Allergic rhinitis, chronic rhinosinusitis, bronchitis Considering lumbar spine surgery- Dr Ophelia Charter. She still rinses daily with syringe, making her own saline nasal rinse. Still productive nasal  greenish brown. Gets frontal and bitemporal headaches. Ears sometimes ache and pop. Denies fever, blood, cough or wheeze.  June 05, 2009- Allergic rhinitis, chronic rhinosinusitis, bronchitis Pending back surgery, myelogram and CT of spine. On vicodin for pain. She worries about her sinuses duding the surgery since she now rinses with saline 3-4 x/day for "clogging up" and won't be able to do that for a while after surgery.. Continues allergy vaccine without problems. Hasn't tried doing without Alavert. CT sinus - no sinusitis, some septal deviation. Taking ampicillin for dental work before her back surgery.  Current Medications (verified): 1)  Topamax 200 Mg  Tabs (Topiramate) .... Take 1 By Mouth Two Times A Day Qd 2)  Atenolol 50 Mg  Tabs (Atenolol)  .... Take 1/2 Tab  By Mouth Qd 3)  Lorazepam 0.5 Mg  Tabs (Lorazepam) .Marland Kitchen.. 1 Three Times A Day 4)  Levoxyl 175 Mcg  Tabs (Levothyroxine Sodium) .... Takew 1 By Mouth Qd 5)  Adult Aspirin Low Strength 81 Mg  Tbdp (Aspirin) .... Take 1 By Mouth Qd 6)  Lovastatin 40 Mg  Tabs (Lovastatin) .... Take 1 Po Qd 7)  Boniva 150 Mg  Tabs (Ibandronate Sodium) .... Take 1 By Mouth Q Month (Generic) 8)  Meclizine Hcl 25 Mg Tabs (Meclizine Hcl) .... Take 1 Tablet By Mouth Every 6 Hours As Needed 9)  Alavert 10 Mg Tabs (Loratadine) .... Take 1 Tablet By Mouth Once A Day 10)  Voltaren 1 %  Gel (Diclofenac Sodium) .... Use As Directed 11)  Stool Softner .... of Choice-2 By Mouth At Bedtime 12)  Souble Fiber Therapy .Marland Kitchen.. 3 By Mouth At Bedtime 13)  Allergy Vaccine 1:10 Gh .... Build As Tolerated 14)  Calcium 600 + D 600-200 Mg-Unit  Tabs (Calcium Carbonate-Vitamin D) .... Take 2 Tablet By Mouth Once A Day 15)  Multivitamins   Tabs (Multiple Vitamin) .... Take 1 Tablet By Mouth Once A Day 16)  Vitamin E 400 Unit  Caps (Vitamin E) .... Take 4 Tablet By Mouth Once A Day 17)  Promethazine Hcl 25 Mg  Tabs (Promethazine Hcl) .... Take 1/2- 1 Tablet Every 6 Hours As Needed For Nausea 18)  Imodium Advanced 2-125 Mg Tabs (Loperamide-Simethicone) .... As Directed As Needed 19)  Glucosamine 1500 Complex  Caps (Glucosamine-Chondroit-Vit C-Mn) .... Take 2 Tablet By Mouth Once A Day 20)  Evening Primrose Oil 1000 Mg Caps (Evening Primrose Oil) .... Take 1 Tablet By  Mouth Once A Day 21)  Vitamin D 1000 Unit Tabs (Cholecalciferol) .... Take 2 Tablet By Mouth Once A Day 22)  Gabapentin 100 Mg Caps (Gabapentin) .... Take 1 Tablet By Mouth Four Times A Day 23)  Fluoxetine Hcl 20 Mg Caps (Fluoxetine Hcl) .... Take 1 Tablet By Mouth Once A Day 24)  Vicodin 5-500 Mg Tabs (Hydrocodone-Acetaminophen) .... Take 1 Every 4-6 Hours As Needed Pain 25)  Amoxicillin 500 Mg Caps (Amoxicillin) .... Prioir To Dental Work 26)  Prilosec Otc 20 Mg  Tbec (Omeprazole Magnesium) .... Take 2 By Mouth  Once Daily 27)  Vitamin C 500 Mg Tabs (Ascorbic Acid) .... Take 1 By Mouth Once Daily  Allergies (verified): 1)  ! Celebrex 2)  ! Pyridium 3)  ! * Glucosamine 4)  ! Sulfa 5)  ! Niacin 6)  ! Trovan 7)  ! Reglan 8)  ! * Trilaforn 9)  ! * Histanex 10)  ! 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) DEPRESSION (ICD-311) EUSTACHIAN TUBE DYSFUNCTION (ICD-381.81) TARDIVE DYSKINESIA (ICD-333.82) DYSPNEA (ICD-786.05) MITRAL VALVE PROLAPSE (ICD-424.0) OSTEOPOROSIS (ICD-733.00) GERD (ICD-530.81) ALLERGIC RHINITIS (ICD-477.9) GLAUCOMA (ICD-365.9) HIATAL HERNIA (ICD-553.3) HYPOTHYROIDISM (ICD-244.9) HYPERTENSION (ICD-401.9) HYPERLIPIDEMIA (ICD-272.4) ANXIETY (ICD-300.00)    Past Surgical History: Last updated: 02/26/2007 (L) Knee 4 surgeries 1 Baker cyst removed Had surgery to her (R) index finger Dilation and curettage x 3  Family History: Last updated: Jun 24, 2008 father - deceased 48's: alzheimer's, CAD mother - deceased 66's: breast cancer with metastatic disease MAunt - DM Family History of Prostate Cancer:Paternal Uncle  Social History: Last updated: 04/22/2008 HSG, 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 Patient never smoked.   Risk Factors: Exercise: no (04/22/2008)  Risk Factors: Smoking Status: never (07/12/2007)  Review of Systems      See HPI  The patient denies anorexia, fever, weight loss, weight gain, vision loss, decreased hearing, hoarseness,  chest pain, syncope, dyspnea on exertion, peripheral edema, prolonged cough, headaches, hemoptysis, abdominal pain, and severe indigestion/heartburn.    Vital Signs:  Patient profile:   70 year old female Height:      63 inches Weight:      161.25 pounds BMI:     28.67 O2 Sat:      98 % on Room air Pulse rate:   80 / minute BP sitting:   126 / 80  (left arm) Cuff size:   regular  Vitals Entered By: Reynaldo Minium CMA 2009-06-24 1:56 PM)  O2 Flow:  Room air  Physical Exam  Additional Exam:  General: A/Ox3; pleasant and cooperative, NAD, SKIN: no rash, lesions NODES: no lymphadenopathy HEENT: [place]/AT, EOM- WNL, Conjuctivae- clear, PERRLA, TM-WNL( some cerumen on L), Nose-pale edema, narrower on right. 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 i don't know if she  keeps it clear with her rinses, or irritates the lining of her nose. She is going to try otc nasalcrom, which won't hurt anything.  Medications Added to Medication List This Visit: 1)  Vicodin 5-500 Mg Tabs (Hydrocodone-acetaminophen) .... Take 1 every 4-6 hours as needed pain 2)  Amoxicillin 500 Mg Caps (Amoxicillin) .... Prioir to dental work 3)  Prilosec Otc 20 Mg Tbec (Omeprazole magnesium) .... Take 2 by mouth  once daily 4)  Vitamin C 500 Mg Tabs (Ascorbic acid) .... Take 1 by mouth once daily  Other Orders: Est. Patient Level III (04540)  Patient Instructions: 1)  Please schedule a follow-up appointment in 6 months. 2)  Try otc nasalcrom/ cromolyn nasal spray. You may need to use this for a couple of weeks to see the  benefit.

## 2010-07-01 NOTE — Miscellaneous (Signed)
Summary: Injection Record / [facility]    Injection Record / [facility]    Imported By: Lennie Odor 01/29/2010 11:11:25  _____________________________________________________________________  External Attachment:    Type:   Image     Comment:   External Document

## 2010-07-01 NOTE — Letter (Signed)
Summary: [facility]  [facility]   Imported By: Sherian Rein 07/14/2009 09:09:47  _____________________________________________________________________  External Attachment:    Type:   Image     Comment:   External Document

## 2010-07-01 NOTE — Progress Notes (Signed)
Summary: On Call asked for promethazine  Phone Note Call from Patient   Summary of Call: On call last night-10/22/09- She said she had been confined to home after back surgery and asked for refill proethazine tabs for nausea. I didn't have computer to realize she has been refilling through my nurse for an ongoing problem. i explained that ongong nausea should be addresed through her primary fphysician. She asked I do it "this time" due to delay and holiday weekend. I called ipremth 25 mg, 1 three times a day as needed, # 15 noref, and asked she contact Dr Debby Bud Tuesday. She agreed.  I will direct this to attention of Dr Debby Bud and discuss with nurse.    New/Updated Medications: PROMETHAZINE HCL 25 MG  TABS (PROMETHAZINE HCL) take 1/2- 1 tablet every 6 hours as needed for nausea Prescriptions: PROMETHAZINE HCL 25 MG  TABS (PROMETHAZINE HCL) take 1/2- 1 tablet every 6 hours as needed for nausea  #15 x 0   Entered and Authorized by:   Waymon Budge MD   Signed by:   Waymon Budge MD on 10/23/2009   Method used:   Telephoned to ...       [facility] [address] (retail)       [address]       [place], [place]  [address]       Ph: [phone] or [phone]       Fax: [phone]   RxID:   [id number]

## 2010-07-29 ENCOUNTER — Telehealth: Payer: Self-pay | Admitting: Internal Medicine

## 2010-08-02 ENCOUNTER — Telehealth: Payer: Self-pay | Admitting: Internal Medicine

## 2010-08-05 NOTE — Progress Notes (Signed)
Summary: refill request  Phone Note Refill Request Message from:  Pharmacy  Refills Requested: Medication #1:  LORAZEPAM 0.5 MG  TABS 1 three times a day [facility] [address] [phone] Fax [phone] requesting 90-day supply of Rx  Initial call taken by: Burnard Leigh [facility]),  July 29, 2010 5:25 PM  Follow-up for Phone Call        ok for refill x 5 Follow-up by: Jacques Navy MD,  July 29, 2010 6:50 PM    Prescriptions: LORAZEPAM 0.5 MG  TABS (LORAZEPAM) 1 three times a day  #270 x 5   Entered by:   Ami Bullins CMA   Authorized by:   Jacques Navy MD   Signed by:   Bill Salinas CMA on 07/30/2010   Method used:   Telephoned to ...       [facility] [address] (retail)       [address]       [place], [place]  [address]       Ph: [phone] or [phone]       Fax: [phone]   RxID:   [id number]

## 2010-08-10 ENCOUNTER — Encounter: Payer: Self-pay | Admitting: Internal Medicine

## 2010-08-10 NOTE — Progress Notes (Signed)
Summary: RF PROMETHAZINE  Phone Note Refill Request Message from:  Fax from Pharmacy on August 02, 2010 1:54 PM  Refills Requested: Medication #1:  PROMETHAZINE HCL 25 MG  TABS take 1/2- 1 tablet every 6 hours as needed for nausea please Advise refills  Initial call taken by: Ami Bullins CMA,  August 02, 2010 1:54 PM  Follow-up for Phone Call        ok for refill as needed  Follow-up by: Jacques Navy MD,  August 02, 2010 6:15 PM    Prescriptions: PROMETHAZINE HCL 25 MG  TABS (PROMETHAZINE HCL) take 1/2- 1 tablet every 6 hours as needed for nausea  #30 x 12   Entered by:   Lamar Sprinkles, CMA   Authorized by:   Jacques Navy MD   Signed by:   Lamar Sprinkles, CMA on 08/02/2010   Method used:   Electronically to        [facility] [phone][facility] (retail)       [address]       [place], [place]  [address]       Ph: [phone] or [phone]       Fax: [phone]   RxID:   [id number]

## 2010-08-12 LAB — POCT I-STAT 4, (NA,K, GLUC, HGB,HCT): Glucose, Bld: 104 mg/dL — ABNORMAL HIGH (ref 70–99)

## 2010-08-20 LAB — COMPREHENSIVE METABOLIC PANEL
ALT: 27 U/L (ref 0–35)
AST: 27 U/L (ref 0–37)
Albumin: 4.3 g/dL (ref 3.5–5.2)
Alkaline Phosphatase: 81 U/L (ref 39–117)
Calcium: 9.2 mg/dL (ref 8.4–10.5)
Chloride: 108 mEq/L (ref 96–112)
GFR calc non Af Amer: >60 mL/min (ref >60)
Sodium: 141 mEq/L (ref 135–145)
Total Bilirubin: 0.6 mg/dL (ref 0.3–1.2)
Total Protein: 7 g/dL (ref 6.0–8.3)

## 2010-08-20 LAB — CBC
Hemoglobin: 13.4 g/dL (ref 12.0–15.0)
Hemoglobin: 9.1 g/dL — ABNORMAL LOW (ref 12.0–15.0)
Hemoglobin: 9.6 g/dL — ABNORMAL LOW (ref 12.0–15.0)
MCHC: 34.7 g/dL (ref 30.0–36.0)
MCHC: 35 g/dL (ref 30.0–36.0)
MCHC: 35.5 g/dL (ref 30.0–36.0)
MCV: 90.6 fL (ref 78.0–100.0)
MCV: 97 fL (ref 78.0–100.0)
Platelets: 140 10*3/uL — ABNORMAL LOW (ref 150–400)
RBC: 2.86 MIL/uL — ABNORMAL LOW (ref 3.87–5.11)
RBC: 3.06 MIL/uL — ABNORMAL LOW (ref 3.87–5.11)
RDW: 14.1 % (ref 11.5–15.5)
RDW: 19.2 % — ABNORMAL HIGH (ref 11.5–15.5)
WBC: 6.8 10*3/uL (ref 4.0–10.5)
WBC: 8.6 10*3/uL (ref 4.0–10.5)

## 2010-08-20 LAB — URINALYSIS, ROUTINE W REFLEX MICROSCOPIC
Hgb urine dipstick: NEGATIVE
Protein, ur: NEGATIVE mg/dL
Urobilinogen, UA: 0.2 mg/dL (ref 0.0–1.0)

## 2010-08-20 LAB — BASIC METABOLIC PANEL
BUN: 9 mg/dL (ref 6–23)
CO2: 23 mEq/L (ref 19–32)
CO2: 24 mEq/L (ref 19–32)
Calcium: 6.8 mg/dL — ABNORMAL LOW (ref 8.4–10.5)
Calcium: 7.3 mg/dL — ABNORMAL LOW (ref 8.4–10.5)
Chloride: 109 mEq/L (ref 96–112)
Creatinine, Ser: 0.73 mg/dL (ref 0.4–1.2)
Creatinine, Ser: 0.89 mg/dL (ref 0.4–1.2)
GFR calc Af Amer: >60 mL/min (ref >60)
GFR calc non Af Amer: >60 mL/min (ref >60)
Glucose, Bld: 134 mg/dL — ABNORMAL HIGH (ref 70–99)
Sodium: 136 mEq/L (ref 135–145)

## 2010-08-20 LAB — PROTIME-INR
INR: 1 (ref 0.00–1.49)
Prothrombin Time: 13.1 seconds (ref 11.6–15.2)

## 2010-08-20 LAB — TYPE AND SCREEN
ABO/RH(D): A POS
Antibody Screen: NEGATIVE

## 2010-08-20 LAB — URINE CULTURE: Colony Count: 45000

## 2010-08-20 LAB — POCT I-STAT 4, (NA,K, GLUC, HGB,HCT)
Glucose, Bld: 94 mg/dL (ref 70–99)
Potassium: 3.8 mEq/L (ref 3.5–5.1)
Sodium: 144 mEq/L (ref 135–145)

## 2010-08-20 LAB — DIFFERENTIAL
Basophils Relative: 1 % (ref 0–1)
Eosinophils Absolute: 0.2 10*3/uL (ref 0.0–0.7)
Eosinophils Relative: 3 % (ref 0–5)
Lymphs Abs: 1.7 10*3/uL (ref 0.7–4.0)

## 2010-08-20 LAB — URINE MICROSCOPIC-ADD ON

## 2010-08-23 ENCOUNTER — Other Ambulatory Visit: Payer: Self-pay | Admitting: Internal Medicine

## 2010-08-23 MED ORDER — MECLIZINE HCL 25 MG PO TABS: 25.0000 mg | ORAL_TABLET | Freq: Four times a day (QID) | ORAL | Status: DC | PRN

## 2010-08-26 NOTE — Letter (Signed)
Summary: [facility]   Imported By: Sherian Rein 08/16/2010 08:43:23  _____________________________________________________________________  External Attachment:    Type:   Image     Comment:   External Document

## 2010-09-16 ENCOUNTER — Encounter: Payer: Self-pay | Admitting: Internal Medicine

## 2010-09-16 ENCOUNTER — Ambulatory Visit (INDEPENDENT_AMBULATORY_CARE_PROVIDER_SITE_OTHER): Payer: Medicare Other | Admitting: Internal Medicine

## 2010-09-16 VITALS — BP 122/80 | HR 70 | Temp 97.7°F | Wt 160.0 lb

## 2010-09-16 DIAGNOSIS — R0789 Other chest pain: Secondary | ICD-10-CM

## 2010-09-16 DIAGNOSIS — H6690 Otitis media, unspecified, unspecified ear: Secondary | ICD-10-CM

## 2010-09-16 DIAGNOSIS — H6693 Otitis media, unspecified, bilateral: Secondary | ICD-10-CM

## 2010-09-16 MED ORDER — AMOXICILLIN 875 MG PO TABS: 875.0000 mg | ORAL_TABLET | Freq: Two times a day (BID) | ORAL | Status: DC

## 2010-09-16 NOTE — Patient Instructions (Signed)
Mild ear infection with red ear drums and sinus tenderness. Plan - take amoxicillin 875mg  twice a day for 10 days. Continue on the claritin. Take sudafed 30mg  twice a day for sinus congestion. Drink plenty of fluids. Tylenol 500mg  one or two every 8 hrs as needed.  Middle Ear Infection, Adult (Otitis Media, Adult) A middle ear infection is an infection in the space behind the eardrum. The medical name for this is "otitis media." It may happen after a common cold. It is caused by a germ that starts growing in that space. You may feel swollen glands in your neck on the side of the ear infection. HOME CARE INSTRUCTIONS  Take your medicine as directed until it is gone, even if you feel better after the first few days.   Only take over-the-counter or prescription medicines for pain, discomfort, or fever as directed by your caregiver.   Occasional use of a nasal decongestant a couple times per day may help with discomfort and help the eustachian tube to drain better.  Follow up with your caregiver in 10 to 14 days or as directed, to be certain that the infection has cleared. Not keeping the appointment could result in a chronic or permanent injury, pain, hearing loss and disability. If there is any problem keeping the appointment, you must call back to this facility for assistance. SEEK IMMEDIATE MEDICAL CARE IF:  You are not getting better in 2 to 3 days.   You have pain that is not controlled with medication.   You feel worse instead of better.   You cannot use the medication as directed.   You develop swelling, redness or pain around the ear or stiffness in your neck.  MAKE SURE YOU:  Understand these instructions.   Will watch your condition.   Will get help right away if you are not doing well or get worse.  Document Released: 02/19/2004 Document Re-Released: 11/03/2009 [facility] Patient Information 2011 [place], [place].

## 2010-09-16 NOTE — Progress Notes (Signed)
  Subjective:    Patient ID: [name], female    DOB: April 20, 1941, 70 y.o.   MRN: [id number]  HPIPatient presents with c/o pain in both ears, sore throat more on the left and sinus pressure. She has chronic allergic rhinitis and sinus congestion that is worse lately. She denies having any fever, chills, N/V, SOB.  She does report several episodes of pins/needles type discomfort left chest and upper abdomen. This has happened at rest. She has no exertional chest discomfort and no limitation in activities.   PMH, FamHx and SocHx reviewed for any changes and relevance.   Review of Systems  Constitutional:  Negative for fever, chills, activity change and unexpected weight change.  HENT:  Negative for hearing loss, neck stiffness and postnasal drip.  Positive for ear pain, sore throat and sinus congestion. Eyes: Negative for pain, discharge and visual disturbance.  Respiratory: Negative for chest tightness and wheezing.   Cardiovascular: Negative for pressure like chest pain and palpitations. She admits to pins/needles tingling in the left chest      [No decreased exercise tolerance Gastrointestinal: [No change in bowel habit. No bloating or gas. No reflux or indigestion Genitourinary: Negative for urgency, frequency, flank pain and difficulty urinating.  Musculoskeletal: Negative for myalgias, back pain, arthralgias and gait problem.  Neurological: Negative for dizziness, tremors, weakness and headaches.  Hematological: Negative for adenopathy.  Psychiatric/Behavioral: Negative for behavioral problems and dysphoric mood.       Review of Systems     Objective:   Physical Exam WNWD white female in no distress HEENT - cerumen obstruction both ears. After irrigation TMs are erythematous - right greater than left. Tender to percussion over the frontal and maxillary sinus.  Chest clear Cor - RRR       Assessment & Plan:  1. Otitis Media  Plan - Amox 875mg  bid x 10  Sudafed 30mg  bid           APAP prn, hydrate  2. Atypical chest pain - symptoms are suggestive of GI discomfort  Plan - trial of tums or liquid antacid.           If symptoms don't respond or if they get worse she is to call for further evaluation.

## 2010-10-11 ENCOUNTER — Telehealth: Payer: Self-pay | Admitting: *Deleted

## 2010-10-11 NOTE — Telephone Encounter (Signed)
Pharm informed

## 2010-10-11 NOTE — Telephone Encounter (Signed)
Is levothyroxine ok to fill? Per [place] law this must be verified and agreed upon by MD and PT due to change in generic manufacturer

## 2010-10-11 NOTE — Telephone Encounter (Signed)
I am ok with levothyroxine

## 2010-10-12 NOTE — H&P (Signed)
[name], HURON                ACCOUNT NO.:  [id number]   MEDICAL RECORD NO.:  [id number]          PATIENT TYPE:  AMB   LOCATION:                                [facility]:  [facility]   PHYSICIAN:  Duke Salvia. Marcelle Overlie, M.D.DATE OF BIRTH:  1941/04/28   DATE OF ADMISSION:  10/05/2006  DATE OF DISCHARGE:                              HISTORY & PHYSICAL   CHIEF COMPLAINT:  Uterine prolapse, SUI.   HISTORY OF PRESENT ILLNESS:  A 70 year old G2, P2, postmenopausal  patient who had some postmenopausal bleeding in 2006 that led to [facility]  hysteroscopy Oct 01, 2004.  Pathology from that procedure showed  fragments suggestive of a benign endometrial polyp.  She has had no  further postmenopausal bleeding since that time, but has experienced  symptomatic uterine prolapse with a noted cystocele and SUI.   Cystometrics in our office demonstrated LPP ranging from 92-132 with a  minimal PVR.  MUCP was 26 and 30.  No uninhibited contractions.  Because  she had complained of some pelvic pain ain April 2006, she underwent CT  with contrast that showed a normal abdominal CT and normal pelvic CT.   She was scheduled for hysterectomy with anterior repair and TOT earlier  this year but had to cancel because of lower back disk surgery.  The  procedure of LABH/BSO anterior repair with TOT was reviewed in detail  including risks related to bleeding, infection, transfusion, adjacent  organ injury, and the possible need for open or additional surgery  discussed.  This was reviewed along with her expected recovery time.  Other risks related to wound infection, phlebitis, urinary retention,  bladder irritability were all reviewed with her, which she understands  and accepts.   ALLERGIES:  None.   OBSTETRICAL HISTORY:  Two vaginal deliveries at term.   SURGERY:  1. D&C x2.  2. Left breast biopsy.  3. Knee replacement.  4. Lower back diskectomy.   MEDICATIONS:  Topamax daily, Atenolol, Lorazepam, Prilosec,  Boniva,  Levoxyl, Naprosyn p.r.n., baby aspirin daily.   FAMILY HISTORY:  Significant for mother with congestive heart failure.   REVIEW OF SYSTEMS:  Significant for history of MVP.  She does take SVP  prophylaxis.   PHYSICAL EXAMINATION:  Temp 98.2, blood pressure 120/78.  HEENT:  Unremarkable.  NECK:  Supple without masses.  LUNGS:  Clear.  CARDIOVASCULAR:  Regular rate and rhythm without murmurs, rubs, or  gallops.  BREASTS:  Without masses.  ABDOMEN:  Soft but nontender.  PELVIC EXAM:  Normal external genitalia  Vagina and cervix revealed  moderate prolapse of the uterus on straining with a cystocele.  Her  posterior support was good.  EXTREMITY EXAM:  Unremarkable.  NEUROLOGIC EXAM:  Unremarkable.  BIMANUAL:  The uterus is a normal size,  mobile.  Adnexa negative.   IMPRESSION:  1. Symptomatic uterine prolapse with cystocele.  2. Stress urinary incontinence.   PLAN:  LAH/BSO anterior repair with COT.  Procedure and risks reviewed  as above.      Richard M. Marcelle Overlie, M.D.  Electronically Signed     RMH/MEDQ  D:  10/03/2006  T:  10/03/2006  Job:  [id number]

## 2010-10-12 NOTE — Discharge Summary (Signed)
[name], [name]                ACCOUNT NO.:  [id number]   MEDICAL RECORD NO.:  [id number]          PATIENT TYPE:  INP   LOCATION:  9306                          [facility]   PHYSICIAN:  Duke Salvia. Marcelle Overlie, M.D.DATE OF BIRTH:  26-Dec-1940   DATE OF ADMISSION:  10/11/2006  DATE OF DISCHARGE:  10/13/2006                               DISCHARGE SUMMARY   DISCHARGE DIAGNOSES:  1. Postoperative diarrhea, possible viral gastroenteritis.  2. Status post laparoscopically assisted vaginal hysterectomy,      bilateral salpingo-oophorectomy, anterior repair with TOT Oct 05, 2006.   For summary of history and physical examination, please see admission  H&P for details.  Briefly, a 70 year old G2 P2 who underwent LAVH,  anterior repair with TOT Oct 05, 2006.  She was discharged after 2 days  with an uncomplicated recovery.   Summary of the history and physical exam, on May 14, the patient was  seen in MAU complaining of low-grade fever and watery diarrhea, was seen  in MAU by Dr. Henderson Cloud where she was afebrile.  Her abdominal exam was  unremarkable.  WBC, however, was 17.9.  She was admitted for observation  and serial monitoring along with rehydration.  On the a.m. of May 15,  her abdominal exam was unremarkable.  She was afebrile.  Repeat CBC  showed WBC 9300, CMET normal except for potassium of 3.2.  Potassium was  added to her IV fluids.  She was started on Imodium.  By the a.m. of May  16, she has significant improvement in her diarrhea, was still afebrile,  hemoglobin 10.4, hematocrit 30.6, WBC 7.9.  Her potassium had improved  at 3.6, and her C-difficile toxin returned negative.   DISPOSITION:  The patient was discharged on regular medicine.  She has  Percocet at home for pain.  She can continue Imodium AD as needed for  any lingering diarrhea.  Will return the office early next week for  follow-up postoperative evaluation and was advised to report any  worsening diarrhea,  bloody diarrhea, abdominal cramping, fever over 101,  increased nausea, vomiting.  We had reviewed other routine post  hysterectomy instructions.   CONDITION:  Good.   ACTIVITY:  Graded increase.      Richard M. Marcelle Overlie, M.D.  Electronically Signed     RMH/MEDQ  D:  10/13/2006  T:  10/13/2006  Job:  [id number]

## 2010-10-12 NOTE — Assessment & Plan Note (Signed)
[facility]                             PULMONARY OFFICE NOTE   [name], [name]                       MRN:          [id number]  DATE:12/22/2006                            DOB:          04/09/1941    PROBLEM:  1. Allergic rhinitis.  2. Eustachian dysfunction.  3. Dyspnea.  4. Mitral valve prolapse.  5. Esophageal reflux.  6. Osteoporosis.  7. Tardive dyskinesia.  8. Degenerative disc disease.   PRIMARY PHYSICIAN:  Dr. Debby Bud.   HISTORY:  Since I had last seen her in March, she had had back surgery  and recently has had injections for her back.  With this, she has been  staying indoors.  Last week she had a GI upset with significant nausea  and indicates that part of evaluation included an MRI of the brain.  She  is pending mammography in September but asked that I check a tender area  in the tail of the left breast.  She had stopped allergy vaccine back in  the winter, at the time of surgeries, but restarted a month ago and is  having no problems.  She is not outdoors much and we discussed utility  of this approach for now, but she hopes to be doing more outside later  in the year.   MEDICATIONS:  1. Estrogen.  2. Topamax 200 mg b.i.d.  3. Atenolol 50 mg times one-half.  4. Lorazepam 0.5 mg t.i.d.  5. Prilosec 20 mg b.i.d.  6. Allergy vaccine is at 1:50.  7. Naproxen 500 mg b.i.d.  8. Aspirin 81 mg.  9. Lovastatin 40 mg.  10.Occasional meclizine or promethazine.  11.Alavert.   DRUG INTOLERANT:  1. SULFA.  2. TESSALON PERLES.  3. NIACIN.  4. TROVAN.  5. CELEBREX.  6. VAGISIL.  7. PYRIDIUM.  8. GLUCOSAMINE.  9. VICODIN.  10.REGLAN.   OBJECTIVE:  Weight 142 pounds, BP 124/78, pulse 73, room air saturation  98%.  Clear quiet chest.  Voice is clear.  No stridor.  No visible  drainage.  Heart sounds are regular without murmur.  There is some  tender nodularity that may be cysts in the axillary tail of the left  breast.   I  encouraged her to go forward with her mammography and routine followup  unless this gets worse acutely, favor fibrocystic disease.   IMPRESSION:  Allergic rhinitis is stable.  Her complex medical problems  center especially around her orthopedic problems.   PLAN:  Will continue vaccine at 1:50 for now, allowing for some  stabilization.  Consider go to 1:10 on return in 6 months, earlier  p.r.n.     Clinton D. Maple Hudson, MD, Tonny Bollman, FACP  Electronically Signed    CDY/MedQ  DD: 12/23/2006  DT: 12/24/2006  Job #: [id number]

## 2010-10-12 NOTE — Op Note (Signed)
NAMECATHERINA, [name]                ACCOUNT NO.:  [id number]   MEDICAL RECORD NO.:  [id number]          PATIENT TYPE:  AMB   LOCATION:  SDC                           [facility]   PHYSICIAN:  Duke Salvia. Marcelle Overlie, M.D.DATE OF BIRTH:  1940-06-29   DATE OF PROCEDURE:  10/05/2006  DATE OF DISCHARGE:                               OPERATIVE REPORT   PREOPERATIVE DIAGNOSIS:  Symptomatic uterine prolapse, cystocele, stress  urinary incontinence.   POSTOPERATIVE DIAGNOSIS:  Symptomatic uterine prolapse, cystocele,  stress urinary incontinence.   PROCEDURE:  LAVH BSO, anterior colporrhaphy with transobturator tape,  cystoscopy.   SURGEON:  Duke Salvia. Marcelle Overlie, M.D.   ASSISTANT:  Juluis Mire, M.D.   ANESTHESIA:  General endotracheal.   COMPLICATIONS:  None.   DRAINS:  Foley catheter.   BLOOD LOSS:  100.   SPECIMENS REMOVED:  Uterus, bilateral tubes and ovaries.   PROCEDURE AND FINDINGS:  The patient was taken to the operating room  after an adequate level of general endotracheal anesthesia was obtained.  With the patient's legs in stirrups, the abdomen, perineum and vagina  were prepped and draped with Betadine.  The bladder was drained.   Attention was direct to the subumbilical area where a small incision was  made after infiltrating with 0.5% Marcaine plain.  The Veress needle was  introduced without difficulty.  It's anterior abdominal position was  verified by pressure and water testing.  After a 2.5 liter  pneumoperitoneum was then created, the laparoscopic trocar and sleeve  were then introduced without difficulty.  Three fingerbreadths above the  symphysis in the midline, a 5-mm trocar was inserted under direct  visualization with the patient in Trendelenburg.  The appendix and upper  abdomen were unremarkable.  The uterus was slightly enlarged with  several small fibroids, adnexa unremarkable, the cul-de-sac free and  clear.  The right tube and ovary were placed on  traction toward the  midline with an atraumatic grasper.  The course of the ureter was well  below.  The right IP ligament was coagulated and divided down to and  including the round ligament, with excellent hemostasis.  The exact same  repeated on the opposite side, carefully identifying the ureter well  below the operative site.  Once this was completed, the vaginal portion  of the procedure was started.   The legs were extended.  A weighted speculum was positioned.  The cervix  was grasped with a tenaculum.  The cervicovaginal mucosa was incised.  Posterior colpotomy performed without difficulty.  The bladder was  advanced superiorly with sharp and blunt dissection.  The peritoneum  anteriorly could be identified and was entered sharply and a retractor  used to gently elevate the bladder out of the field.  The handheld  LigaSure was then used to coagulate and divide the uterosacral ligament,  the cardinal ligament, and uterine vasculature pedicles.  The upper  broad ligament pedicles were then coagulated and divided, and the fundus  of the uterus was delivered posteriorly.  The remaining pedicles were  coagulated and divided with the attached tubes and ovaries.  The cuff  was then closed from three to nine o'clock with a locked 2-0 Vicryl  suture.  This was hemostatic.  The vaginal mucosa was then closed right-  to-left with interrupted 2-0 Monocryl sutures.  The anterior repair was  started at that point from midurethra down to the vaginal cuff.  The  vaginal mucosa was opened in the midline.  Sharp and blunt dissection  was used to dissect the paravesical fascia and bladder away from the  mucosa.  The paravesical fascia was then plicated in the midline with 2-  0 Vicryl interrupted sutures.  At that point the measurements were made  for the TOT at the level of the clitoris on each side.  A mark was made  at the obturator foramen just where the inferior ramus meets the  adductor  longus muscle on each side.  A small incision was made in that  marked spot.  The transobturator Obtryx halo needle was then positioned.  The needle passed through the obturator canal and brought out through  the midurethral incision area.  This was done on each side.  The tape  was then pulled through; however, it was noted on the right side that  the tape had buttonholed the mucosa, and this was removed.  The bladder  had been drained prior to the first passage.   The needles were passed again according to protocol, with normal  passage.  The cystoscopy was then carried out revealing a trabeculated  bladder but no evidence of any injury to the bladder.  The tape was then  pulled through.  It was in its appropriate position and was tensioned  appropriately and cut free at the needle insertion sites.  A small  amount of mucosa was trimmed and closed right-to-left with 2-0 Monocryl  sutures.  Excellent hemostasis.  Vaginal pack with Estrace was then  positioned.  Foley catheter had been positioned draining clear urine.  Repeat laparoscopy and irrigation carried out revealing excellent  hemostasis of the pelvic operative site.  The instruments were removed.  Gas was allowed to escape.  The defect was closed with 4-0 Dexon  subcuticular sutures and Dermabond.  The TOT needle insertion sites were  closed with Dermabond also.  She tolerated this well.  Clear urine noted  at the end of the case.      Richard M. Marcelle Overlie, M.D.  Electronically Signed     RMH/MEDQ  D:  10/05/2006  T:  10/05/2006  Job:  [id number]

## 2010-10-12 NOTE — Assessment & Plan Note (Signed)
[facility]                             PULMONARY OFFICE NOTE   ECHO, PROPP                       MRN:          [id number]  DATE:04/20/2007                            DOB:          May 28, 1941    PROBLEM LIST:  1. Allergic rhinitis.  2. Eustachian dysfunction.  3. Dyspnea.  4. Mitral valve prolapse.  5. Esophageal reflux.  6. Osteoporosis.  7. Tardive dyskinesia.  8. Degenerative disk disease.   PRIMARY CARE PHYSICIAN:  Rosalyn Gess. Norins, M.D.   HISTORY:  Note that this visit is being duplicate recorded as I try to  initiate EMR.  She took two weeks of Omnicef and said that it did help  her postnasal drip and cough.  She continues nasal saline lavage, which  she says every day washes out green mucus and sometimes black stuff.  We again note that CT at Dr. Jearld Fenton' office apparently did not  demonstrate opacified sinuses or retained fluid in the sinuses.  She  describes a frontal headache.  She also complains of a less specific  generalized aching across her back and ribs which is unrelated but  aggravated some by cough.   REVIEW OF SYSTEMS:  Negative for obvious blood, nausea, vomiting, chest  pain, adenopathy, or leg edema.   MEDICATIONS:  Charted and reviewed.  She continues allergy vaccine at  1:10 without problems and believes it has helped her.   OBJECTIVE:  VITAL SIGNS:  Weight 150 pounds, BP 128/66, pulse 70, room  air saturation 98%.  HEENT:  There is distinct green postnasal drainage.  No periorbital  edema.  LUNGS:  Dry cough.  No stridor.  Clear chest.  No adenopathy.  HEART:  Normal heart sounds.   IMPRESSION:  Chronic sinusitis.   I think this is likely more than an atrophic sinusitis and I question  possibility of a fungal sinusitis.  I am not sure if we will learn much  by throat swab for culture but if she fails to respond to another  antibiotic surge, I think that should be an option along with discussion  by Dr.  Jearld Fenton.   PLAN:  1. Augmentin 875 mg b.i.d. x2 weeks.  2. Continue nasal saline lavage.  3. Keep scheduled appointment.  4. Follow up with Dr. Jearld Fenton as scheduled.     Clinton D. Maple Hudson, MD, Tonny Bollman, FACP  Electronically Signed    CDY/MedQ  DD: 04/21/2007  DT: 04/22/2007  Job #: [id number]   cc:   Suzanna Obey, M.D.

## 2010-10-12 NOTE — Discharge Summary (Signed)
[name], [name]                ACCOUNT NO.:  [id number]   MEDICAL RECORD NO.:  [id number]          PATIENT TYPE:  INP   LOCATION:  9317                          FACILITY:  [facility]   PHYSICIAN:  Duke Salvia. Marcelle Overlie, M.D.DATE OF BIRTH:  [birth year]   DATE OF ADMISSION:  10/05/2006  DATE OF DISCHARGE:  10/07/2006                               DISCHARGE SUMMARY   DISCHARGE DIAGNOSES:  1. Symptomatic uterine prolapse.  2. Stress urinary incontinence.  3. Laparoscopically assisted vaginal hysterectomy, bilateral salpingo-      oophorectomy, anterior repair and TOT this admission.   SUMMARY OF THE HISTORY OF PHYSICAL EXAMINATION:  Please see admission  H&P for details.  Briefly, 70 year old, G2, P2, postmenopausal patient  with symptomatic uterine prolapse and stress urinary incontinence  presents for correction.   HOSPITAL COURSE:  On May 8 under general anesthesia, the patient  underwent LAVH BSO, anterior repair with TOT.  Catheter and vaginal pack  were kept in overnight.  The following a.m., the catheter was removed.  She was able to void 300-400 mL each time.  By ultrasound estimate had a  100-200 mL residual which decreased slowly over the next day.  Preoperative hemoglobin was 12.5; on postoperative day #1 was 9.6.  Postoperative day #2, hemoglobin 9.8, platelets 131,000, WBC 7200.  By  the a.m. of May 10, she was afebrile, tolerating a regular diet, still  voiding 300-400 mL without difficulty and was ready for discharge.   Other laboratory data, blood type A positive, antibody screen was  negative.  CMET was normal.  EKG showed normal sinus rhythm.   DISPOSITION:  The patient will be discharged to resume her routine  medications.  Additionally Tylox 1 or 2 p.o. q.4-6h. p.r.n. pain, Cipro  250 b.i.d. x5, will return to the office in 1 week.  Advised to report  any incisional redness or drainage, increased pain or bleeding or fever  over 101.  She was also given specific  instructions regarding diet, sex,  exercise.   CONDITION:  Good.   ACTIVITY:  Graded increase.      Richard M. Marcelle Overlie, M.D.  Electronically Signed     RMH/MEDQ  D:  10/07/2006  T:  10/07/2006  Job:  [id number]

## 2010-10-12 NOTE — Assessment & Plan Note (Signed)
[facility]                             PULMONARY OFFICE NOTE   LANISE, MERGEN                       MRN:          [id number]  DATE:03/30/2007                            DOB:          28-Jul-1940    PROBLEM:  1. Allergic rhinitis.  2. Eustachian dysfunction.  3. Dyspnea.  4. Mitral valve prolapse.  5. Esophageal reflux.  6. Osteoporosis.  7. Tardive dyskinesia.  8. Degenerative disk disease.   PRIMARY CARE PHYSICIAN:  Rosalyn Gess. Norins, M.D.   HISTORY:  Complaints of chest congestion, black and yellow mucus,  bilateral ear pain.  Postnasal drip upsets her stomach.  Feels sore  through the upper anterior rib cage bilaterally.  She had had a CT brain  scan, making no comment about sinus disease, and she says Dr. Jearld Fenton did  a CT scan of her sinuses, which was negative.  She does saline lavage  several times a day, and each day gets a green discharge.   MEDICATION:  1. Topamax 200 mg b.i.d.  2. Atenolol 50 mg times one-half.  3. Lorazepam 0.5 mg.  4. Prilosec 2 mg b.i.d.  5. Allergic vaccine at 1:50.  6. Levoxyl 175 mcg.  7. Naproxen 500 mg.  8. Aspirin 81 mg.  9. Glycolax.  10.Lovastatin 40 mg.  11.Remifemin.  12.Meclizine 25 mg times one-half q.6h. p.r.n.  13.Promethazine 25 mg times one-half q.6h. p.r.n. nausea.   DRUG INTOLERANCE:  1. Celebrex.  2. Vagisil.  3. Pyridium.  4. Glucosamine sulfa.  5. Niacin.  6. Histussin.  7. Vicodin.  8. Reglan.  9. Trilafon.   OBJECTIVE:  HEENT:  There is obvious dark pea green colored mucus in the  posterior pharynx, consistent with postnasal drainage.  CHEST:  The chest sounds clear.  HEART:  Heart sounds irregular without murmur.  I do not find  adenopathy.   IMPRESSION:  1. 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  2. Musculoskeletal pain.   PLAN:  Omnicef 600 mg daily for 2 weeks.  Will advance her back pain to  1-10.  Flu vaccine discussed and  given.  Schedule return in 3 months,  earlier p.r.n.  She will follow with Dr. Jearld Fenton p.r.n. for her sinus  disease.     Clinton D. Maple Hudson, MD, Tonny Bollman, FACP  Electronically Signed    CDY/MedQ  DD: 04/01/2007  DT: 04/02/2007  Job #: [id number]   cc:   Suzanna Obey, M.D.

## 2010-10-12 NOTE — Assessment & Plan Note (Signed)
[facility]                             PULMONARY OFFICE NOTE   [name], [name]                       MRN:          [id number]  DATE:04/27/2007                            DOB:          21-Feb-1941    PROBLEMS:  1. Allergic rhinitis with chronic rhinosinusitis.  2. Eustachian dysfunction.  3. Dyspnea.  4. Mitral valve prolapse.  5. Esophageal reflux.  6. Osteoporosis.  7. Tardive dyskinesia.  8. Degenerative disc disease.   HISTORY:  She comes as a work in visit today asking to be seen by me  while she was here for her allergy shot because toe turned black  yesterday and Dr. Debby Bud was unavailable. She says that her toes tend  to be numb anyway. She talks about dancing and standing up for long  periods to cook now for the holiday meal. Yesterday, she noticed a dark  area along the outside edge of her left great toe. It had been a little  tender.   OBJECTIVE:  Weight 149 pounds, blood pressure 108/72, pulse 78, room air  saturation 97%. She does not seem in particular distress. She is wearing  tennis shoes. The outer edge of the right great toe appeared to be  bruised. The nail was opaque and I could not judge capillary refill, but  the toe was warm. She had a good dorsalis pedis pulse and she could move  her toes normally.   IMPRESSION:  Probable ecchymosis of the toe. Watch for trauma. I doubt  ischemia.   RECOMMENDATIONS:  Try to elevate the foot, keep it warm and dry, and  wear shoes allowing more room for the toes. If there is progression or  failure to clear, she is to follow this up with Dr. Debby Bud. She will  return to see me as previously scheduled.     Clinton D. Maple Hudson, MD, Tonny Bollman, FACP  Electronically Signed    CDY/MedQ  DD: 04/28/2007  DT: 04/29/2007  Job #: [id number]   cc:   Rosalyn Gess. Norins, MD

## 2010-10-15 NOTE — Assessment & Plan Note (Signed)
[facility]                               PULMONARY OFFICE NOTE   [name], [name]                       MRN:          [id number]  DATE:02/10/2006                            DOB:          1940/09/02    Primary care physician, Dr. Illene Regulus.  GI, Dr. Carman Ching.  Orthopedics, Dr. Annell Greening.   PROBLEM:  1. Allergic rhinitis.  2. Dyspnea.  3. Mitral valve prolapse.  4. Esophageal reflux.  5. Osteoporosis.   HISTORY:  She has had a persistent head congestion.  She called September 10  for an antibiotic and decongestant, and was given amoxicillin and Prolex-D.  She says her throat is still somewhat sore.  She cannot hear well out of her  left ear, and she feels some nasal pressure.  Nasal saline irrigation helps  on a b.i.d. basis.  SHE has a few days left of the medications mentioned  above.  She is now pending possible hysterectomy and wants to get this  cleared up.  She describes no headache or fever.   MEDICATIONS:  1. Remefemin.  2. Prilosec.  3. Lovastatin 40 mg.  4. Naproxen 500 mg b.i.d.  5. Lorazepam 0.5 mg b.i.d.  6. Aspirin 81 mg.  7. Levoxyl 175 mcg.  8. Atenolol one-half times 50 mg.  9. Topamax 200 mg b.i.d.  10.Calcium with vitamin D.  11.Timolol 0.5% left eye daily.  12.Nasonex p.r.n.  13.Promethazine one-half times 25 mg q. 6 h p.r.n.Marland Kitchen  14.Artificial tears.  15.Boniva.  16.Esterase 0.1% 2-3 times weekly.  17.Tegretol 200 mg times one-half daily.   DRUG INTOLERANCE TO SULFA, TESSALON PEARLS, NIACIN, TROVAN, CELEBREX,  VAGISIL, PERIDIUM, GLUCOSAMINE, VICODIN, AND REGLAN.   OBJECTIVE:  VITAL SIGNS:  Weight 130 pounds, BP 114/70, pulse 72, room air  saturation 96%.  HEENT:  There is visible green post-nasal drainage.  Mucus in the left  naris.  Both tympanic membranes are somewhat retracted, left more than  right.  I do not feel adenopathy.  LUNGS:  Clear.  HEART:  Sounds regular without murmur.   IMPRESSION:  Sinusitis, eustachian dysfunction.   PLAN:  1. Finish amoxicillin, then start Ceclor 500 mg b.i.d. for 10 days.  2. Continue nasal steroid spray.  She is given a sample here of Nasacort      AQ to extend what she already has at home.  3. Continue saline irrigations and the decongestant tablets.  4. Schedule return in 3 weeks, earlier p.r.n.                                   Clinton D. Maple Hudson, MD, FCCP, FACP   CDY/MedQ  DD:  02/11/2006  DT:  02/12/2006  Job #:  [id number]

## 2010-10-15 NOTE — Assessment & Plan Note (Signed)
[facility]                               PULMONARY OFFICE NOTE   LAREEN, MULLINGS                       MRN:          [id number]  DATE:03/03/2006                            DOB:          [birth year]    PROBLEM LIST:  1. Allergic rhinitis.  2. Dyspnea.  3. Mitral valve prolapse.  4. Esophageal reflux.  5. Osteoporosis.   PRIMARY CARE PHYSICIAN:  Rosalyn Gess. Norins, MD.   HISTORY:  She is complaining again of nasal congestion, ears stopped up,  some vague pressure headache diffusely and is still using saline nasal  lavage and Nasonex. She has a sharp sore spot inside her right nostril. I  reviewed her medication list with her again which is charted. She is  followed by her psychiatrist and understands that her tendency to rock may  be tardive dyskinesia. She says she was bitten by an insect on her left  shoulder about 2 weeks ago. It was swollen there for several days and has  faded to a small residual spot. She asks if that bite could make her feel  tense for the past 2 weeks. Followup imaging for her left breast density is  pending. She does use her Nasonex.   OBJECTIVE:  VITAL SIGNS:  Weight 134 pounds, BP 128/80, pulse regular 69,  room air saturation 99%.  GENERAL:  Calm affect without unusual movement now. There is a small raised  area suggestive of a resolving insect bite on the left shoulder with no  associated erythema or adenopathy.  LUNGS:  Clear and unlabored.  BREASTS:  Without dominant mass or discharge.  HEART:  Sounds regular without murmur or gallop heard today. I do not hear a  click. There is turbinate edema in the right nostril and I cannot be sure I  am not seeing a polyp. The left tympanic membrane is red and retracted.   IMPRESSION:  1. Allergic rhinitis, question of chronic sinusitis.  2. Left otitis.   PLAN:  1. Topical Neosporin for the sore place in her nose.  2. Cipro otic HC 3 drops left ear b.i.d. for 7  days.  3. Increase Nasonex to b.i.d.  4. Schedule return in 3 weeks. If she is still complaining of head      congestion at that point, we      have discussed sending her to an Ear, nose and throat physician.  5. Flu vaccine is discussed and given today.       Clinton D. Maple Hudson, MD, FCCP, FACP      CDY/MedQ  DD:  03/03/2006  DT:  03/06/2006  Job #:  [id number]

## 2010-10-15 NOTE — Op Note (Signed)
NAMEAREAL, COCHRANE                ACCOUNT NO.:  [id number]   MEDICAL RECORD NO.:  [id number]          PATIENT TYPE:  INP   LOCATION:  NA                           FACILITY:  [facility]   PHYSICIAN:  Mark C. Ophelia Charter, M.D.    DATE OF BIRTH:  1940-06-10   DATE OF PROCEDURE:  09/05/2005  DATE OF DISCHARGE:                                 OPERATIVE REPORT   PREOPERATIVE DIAGNOSIS:  Left knee osteoarthritis.   POSTOPERATIVE DIAGNOSIS:  Left knee osteoarthritis.   PROCEDURE:  Left total knee arthroplasty, computer assisted.   SURGEON:  Mark C. Ophelia Charter, M.D.   ANESTHESIA:  GOT plus femoral nerve block.   COMPONENTS USED:  DePuy Sigma rotating platform, cruciate substituting, 2.5  femur, 2.5 tibia, 10-mm insert, 32-mm patella, all cemented.   PROCEDURE:  After induction of general anesthesia and preoperative Ancef for  prophylaxis, standard prepping with DuraPrep including the foot, usual  extremity sheets and drapes, total knee split sheets, impervious, and  Betadine draping were applied. Midline incision was planned with sterile  skin marker. Leg was wrapped in Esmarch, tourniquet inflated. Incision was  made. Superficial retinaculum was developed two retinaculum, was divided  with a cuff of tissue for later repair medially along the patella. Patella  flipped over, bone was cut. Shantz pins were placed, 5-mm, into tibia and  femur. Hip center was identified, and then standard sequencing for both the  tibial and femoral models were made. There was 2.5 degrees lacking full  extension, 4 degrees of valgus primarily due to lateral femoral condyle wear  which showed exposed subchondral bone and minimal wear on the medial femoral  condyle. Meniscus resected, and tibial cut was made first. Computer  recommended 15-mm cut; however, 10-mm initially was cut, and then an  addition 2 mm were taken. Chamfer cuts were made on the femur, followed by  box cuts for 2.5. Rotation was confirmed with computer and  posterior capsule  was released slightly with resection of the posterior spurs with 3/4 curved  osteotome. Fluoroscopy showed the 12.5-mm bearing ideal; per computer is a  little bit tight. A 10-mm gave full extension and perfect alignment within  0.5 mm. After irrigation, the patella was prepared, cutting from facet to  facet, drilled holes 32 mm size. Cement was vacuum mixed. Tibia was cemented  first, followed by femur, followed by patella. Cement was hard at 15  minutes. Tourniquet was deflated after 1 hour and 18 minutes. Hemostasis was  obtained. Deep retinaculum closed with #1 Dacron, 2-0 Vicryl in the  superficial retinaculum and subcutaneous tissue. Skin staple closure.  ______________ placed postoperatively.      Mark C. Ophelia Charter, M.D.  Electronically Signed     MCY/MEDQ  D:  09/05/2005  T:  09/06/2005  Job:  [id number]

## 2010-10-15 NOTE — H&P (Signed)
[name], [name]                ACCOUNT NO.:  [id number]   MEDICAL RECORD NO.:  [id number]          PATIENT TYPE:  AMB   LOCATION:  SDC                           FACILITY:  [facility]   PHYSICIAN:  [name]. [name], M.D.DATE OF BIRTH:  [birth year]   DATE OF ADMISSION:  10/01/2004  DATE OF DISCHARGE:                                HISTORY & PHYSICAL   CHIEF COMPLAINT:  Postmenopausal bleeding.   HISTORY OF PRESENT ILLNESS:  A 70 year old postmenopausal patient referred  to me by [name].  She has been on Prempro since age 55, has done well  until the last six months, when she began to experience some spotting on and  off.  GYN saline infusion ultrasound was done here in our office, which  showed a 10 mm density within the endometrial stripe and some calcifications  noted.  On saline infusion, there was a 9 mm polyp noted, adnexa  unremarkable.   She has seen [name] and [name] in the past for evaluation of  abdominal pain.  This has persisted, mainly midepigastric to right mid- and  lower quadrant.  Abdominal-pelvic CT with contrast was performed recently  that was read out as entirely normal.  She is still complaining of episodic  pain, and we will refer back to [name] for ongoing evaluation.   PAST MEDICAL HISTORY:  Allergies:  None.   Operations:  D&C, left breast biopsy.   Obstetrical history:  Two vaginal deliveries at term.   Medications are innumerable:  Topamax for headache prevention, Detrol LA,  and lorazepam p.r.n., atenolol, Paxil 20 mg a day, meclizine, baby aspirin  daily, Naprosyn p.r.n., Lexapro daily, Prempro, Levoxyl, Prilosec, vitamin  and calcium.   FAMILY HISTORY:  Significant for mother with diabetes and congestive heart  failure.  History of mitral valve prolapse, which has been fairly  asymptomatic.   PHYSICAL EXAMINATION:  VITAL SIGNS:  Temperature 98.2, blood pressure  120/72.  HEENT:  Unremarkable.  NECK:  Supple without masses.  CHEST:  Lungs clear.  CARDIOVASCULAR:  Regular rate and rhythm without murmurs, rubs or gallops  noted.  BREASTS:  Without masses.  ABDOMEN:  Soft, flat and nontender.  PELVIC:  Normal external genitalia.  Vagina and cervix clear.  Uterus  midposition, normal size.  Adnexa negative.   IMPRESSION:  Postmenopausal bleeding.   PLAN:  D&C hysteroscopy.  This procedure, including risks of bleeding,  infection and other complications that may require additional surgery were  all reviewed with her, which she understands and accepts.      RMH/MEDQ  D:  09/22/2004  T:  09/22/2004  Job:  [id number]

## 2010-10-15 NOTE — Assessment & Plan Note (Signed)
[facility]                               PULMONARY OFFICE NOTE   PENNE, ROSENSTOCK                       MRN:          [id number]  DATE:01/13/2006                            DOB:          1941/05/19    PROBLEM LIST:  1. Allergic rhinitis.  2. Dyspnea.  3. Mitral valve prolapse.  4. Esophageal reflux.  5. Osteoporosis.   PRIMARY PHYSICIAN:  Rosalyn Gess. Norins, M.D.   HISTORY:  She is still recovering slowly from her left total knee  replacement.  She notices increased head congestion and sniffing in the last  week.  Astelin helps her some at night but she says she blows out chunks  mostly I think of dried mucus.  She is not having headache or purulent  discharge.  She prefers Nasonex over generic fluticasone but cannot afford  it.  She has continued doing saline nasal lavage once a day.  Allergy  vaccine continues at 1:50, still on buildup by her description with no  problems.   MEDICATIONS:  1. Remefemin  300 mg.  2. Zantac 300 mg.  3. Prilosec OTC.  4. Lovastatin 40 mg.  5. Naproxen 500 mg b.i.d.  6. Lorazepam 0.5 mg t.i.d.  7. Baby aspirin.  8. Levoxyl 175 mcg.  9. Atenolol 25 mg.  10.Topamax 200 mg b.i.d.  11.Promethazine p.r.n.  12.Meclizine 25 mg.  13.Timolol 0.5% drops.  14.Aspirin 81 mg.  15.Nasonex p.r.n.  16.Astelin.   DRUG INTOLERANCE:  1. SULFA.  2. TESSALON PERLES.  3. NIACIN.  4. TROVAN.  5. CELEBREX.  6. VAGISIL.  7. PYRIDIUM.  8. GLUCOSAMINE.  9. VICODIN.  10.REGLAN.   OBJECTIVE:  VITAL SIGNS:  BP 126/70, pulse regular 60, room air saturation  98%.  HEENT:  Conjunctivae are clear.  Nasal mucosa looks edematous and glistens  but I am less sure today that I am seeing nasal polyps.  There is some  retained mucus.  She does sound mildly congested with no post nasal  drainage.  Pharynx is clear.  LUNGS:  Clear to P&A.  HEART:  Sounds are regular without murmur or gallop.   IMPRESSION:  1. Rhinitis with  an allergic rhinitis component.  2. There may be mild nasal polyposis especially suppressed by maintenance      nasal steroid.  We will continue watching that.  3. Recent eustachian dysfunction.   PLAN:  1. Sample Nasonex, once each nostril, then resume fluticasone.  2. Continue allergy vaccine with risk/benefit discussion including      discussion of concomitant use of beta-blockers.  3. Nasal nebulizer with Neo-Synephrine.  4. Schedule return 1 month, earlier p.r.n.                                   Clinton D. Maple Hudson, MD, FCCP, FACP   CDY/MedQ  DD:  01/14/2006  DT:  01/14/2006  Job #:  [id number]

## 2010-10-15 NOTE — H&P (Signed)
[name], [name]                ACCOUNT NO.:  [id number]   MEDICAL RECORD NO.:  [id number]          PATIENT TYPE:  AMB   LOCATION:  SDC                           FACILITY:  [facility]   PHYSICIAN:  Duke Salvia. Marcelle Overlie, M.D.DATE OF BIRTH:  Oct 06, 1940   DATE OF ADMISSION:  DATE OF DISCHARGE:                                HISTORY & PHYSICAL   CHIEF COMPLAINT:  Abnormal uterine bleeding, postmenopausal.   HISTORY OF PRESENT ILLNESS:  A 70 year old postmenopausal patient. Her  medical doctor is Dr. Elisabeth Most here in [place]. Around age 31 she was  started on Prempro. She has done well with that until recently she began to  experience some spotting off and on for the last 6 months. Additionally, she  has had right lower quadrant pain not associated with nausea, vomiting,  diarrhea, or any change in bowel habits. She has not had a prior  colonoscopy.   SHG was done here in the office that showed a 10 mm density seen in the  endometrial stripe with some calcification. Adnexa unremarkable. There was  no fluid in the cul-de-sac. This certainly appeared to be an endometrial  polyp.   As part of her further evaluation, she had an abdominopelvic CT with  contrast to evaluate her pelvic pain that showed a normal abdominal and  pelvic CT. She presents now for [facility] hysteroscopy. This procedure including  risks of bleeding, infection, adjacent organ injury, the possible need for  open or additional surgery all reviewed with her which she understands and  accepts.   PAST MEDICAL HISTORY:  Allergies:  None.   OBSTETRICAL HISTORY:  Two vaginal deliveries in 1960 and 1961. She has also  had a D&C, benign left breast biopsy.   CURRENT MEDICATIONS:  Include Topamax b.i.d., Detrol LA, lorazepam,  atenolol, Paxil, Meclizine, baby aspirin daily, Naprosyn p.r.n., Lexapro  daily, promethazine p.r.n., Prempro daily, Levoxyl daily, Prilosec OTC, and  other vitamins and supplements.   FAMILY HISTORY:   Significant for diabetes and congestive heart failure with  her mother.   REVIEW OF SYSTEMS:  She has a history of mitral valve prolapse and thyroid  disease.   PHYSICAL EXAMINATION:  VITAL SIGNS:  Temperature 98.2, blood pressure  110/72.  HEENT:  Unremarkable.  NECK:  Supple without masses.  LUNGS:  Clear.  CARDIOVASCULAR:  Regular rate and rhythm without murmurs, rubs, gallops  noted.  BREASTS:  Without masses  ABDOMEN:  Soft, flat, nontender.  PELVIC:  Normal external genitalia, vagina and cervix clear. Uterus mid  position, normal size. Adnexa negative.  EXTREMITIES AND NEUROLOGIC:  Unremarkable.   IMPRESSION:  Postmenopausal bleeding.   PLAN:  D&C hysteroscopy. Procedure and risks reviewed as above.      RMH/MEDQ  D:  10/01/2004  T:  10/01/2004  Job:  [id number]

## 2010-10-15 NOTE — Consult Note (Signed)
[facility]  Patient:    [name]                        MRN: [id number] Proc. Date: 04/06/99 Adm. Date:  [id number] Attending:  Annamarie Dawley                          Consultation Report  PATIENTS ADDRESS:  [address] [place], [place] [address]  DATE OF BIRTH:  [birth year]  REASON FOR CONSULTATION:  This 70 year old, right-handed, white widowed female is seen in consultation at the request of Dr. Oneta Rack in the [facility] Emergency Room for evaluation of anxiety and a gait disorder.  HISTORY OF PRESENT ILLNESS:  This patient has a history of anxiety with poor sleep and nervous feeling and generally not feeling well. She was admitted to [facility] on February 12, 1999, because of increased anxiety and decreased function with inability to work or care for herself with feelings of being overwhelmed. he was placed on alprazolam; and as an outpatient, she was subsequently switched to Valium seven days ago which she has built up to 10 mg five times per day. Her son has noted that over that the last few weeks she has had a staggering gait; but n the hospital, she was noted to have staggering gait; and in September, she was evaluated by Santina Evans A. Orlin Hilding, M.D., a neurologist. At that time, CBC was unremarkable. Her thyroid profile was unremarkable, and a CPK was normal. A urine test was positive for benzodiazepines, and MRI study of the brain with contrast  showed generalized atrophy without acute abnormality present. As an outpatient, she has had increasing difficulty with anxiety and crying spells, stumbling gait, and headaches without vertigo, single-eye vision loss, double vision, swallowing problems, etc. She has had no focal weakness or numbness. The family called Dr. Oneta Rack today who recommended she come to the emergency room for further evaluation.  PAST MEDICAL HISTORY:  Reveals that  she has had left knee surgery x 2 in 1980. eft breast biopsy in 1986. She has a known history of hypertension for 20 years, a known heart murmur, hiatal hernia, and hypothyroidism. She has complained in the medical review of systems of some shortness of breath.  CURRENT MEDICATIONS:  1. Valium 10 mg five times per day which she has been on for approximately one     week.  2. Atenolol 50 mg q.d.  3. Vioxx 25 mg q.d.  4. Claritin 10 mg q.d.  5. Lipitor 10 mg q.d.  6. Ditropan XL 10 mg one q.d.  7. Alprazolam 5 mg which was recently discontinued to start the Valium.  8. Synthroid 0.125 mg q.d.  9. Zyprexa 5 mg two q.h.s. 10. Prilosec 20 mg q.d. 11. Celexa 20 mg q.d.  SOCIAL HISTORY:  She does not smoke cigarettes or drink alcohol.  ALLERGIES:  She has no known drug allergies.  FAMILY HISTORY:  Reveals that her mother died at age 61 from cancer. Father died at 59 of unknown causes. She has one brother who died at 41 from a gunshot wound. he has two brothers in their 61s living and well and one sister 32 living and well.  PHYSICAL EXAMINATION:  GENERAL:  Well-developed white female with slightly slurred speech.  VITAL SIGNS:  Her blood pressure in the right and left arm was 130/70 lying, blood pressure right  arm 130/70 standing, right arm 130/70, heart rate is 84 and regular, there were no bruits. Neck flexion and extension maneuvers were unremarkable .  MENTAL STATUS EXAMINATION:  She was alert and oriented x 3. She for some reason had difficulty knowing the president, but she would follow one, two, and three step  commands and named objects quite well. There was no evidence of aphasia. Cranial nerve examination revealed the visual fields were full. The disks were flat. The extraocular movements were full. There is a slight right ptosis. Hearing was intact with air conduction greater than bone conduction. Tongue was midline. The uvula was midline. Gags were present.  Sternocleidomastoid and trapezius testing were normal. She did have a dysarthria. Motor examination revealed good strength in the upper and lower extremities. She had fair finger-to-nose, fair heel-to-shin. She had n outstretched hand and arm tremor. Sensory exam was intact to pinprick. ______ vibration and deep tendon reflexes were 1+. Plantar responses were downgoing. Gait examination was wide based. She could stand on her toes. She could stand on her  heels.  LABORATORY DATA:  Included hemoglobin of 12.1, hematocrit 32.5, white blood cell count 6700, platelets of 191,000. Glucose is 82, BUN 16, creatinine 0.7, sodium  136, potassium 4, chloride 102, CO2 content 30, SGOT 21, SGPT 15, alkaline phosphatase 69, total bilirubin 0.2. Urine drug screen is at this time pending. A urinalysis was unremarkable otherwise.  CT scan of the brain without contrast enhancement shows evidence of mild diffuse atrophy and cerebellar atrophy particularly in the anterior vermian distribution.  IMPRESSION: 1. Anxiety, code 300.00. 2. Toxic encephalopathy, code 348.3. 3. 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  PLAN:  At this time is to discuss her situation with psychiatry. I think further evaluation should include and consider a spinal tap with CSF IgG, vitamin E levels, and consideration of ______  antibodies for cerebellar remote effective cancer.  Further information from her family may be of benefit to find out if there is any inherited form of cerebellar disorder. DD:  04/06/99 TD:  04/06/99 Job: [id number]

## 2010-10-15 NOTE — Op Note (Signed)
[name], [name]                ACCOUNT NO.:  [id number]   MEDICAL RECORD NO.:  [id number]          PATIENT TYPE:  OIB   LOCATION:  5018                         FACILITY:  [facility]   PHYSICIAN:  Mark C. Ophelia Charter, M.D.    DATE OF BIRTH:  1940-10-22   DATE OF PROCEDURE:  06/19/2006  DATE OF DISCHARGE:                               OPERATIVE REPORT   PREOPERATIVE DIAGNOSES:  L5-S1 herniated nucleus pulposus, right, with  free fragment.   POSTOPERATIVE DIAGNOSES:  L5-S1 herniated nucleus pulposus, right, with  large epidural hematoma.   PROCEDURES:  Right L5 hemilaminectomy, microdiskectomy, L5-S1, and  evacuation of epidural hematoma.   SURGEON:  Mark C. Ophelia Charter, M.D.   COMPLICATIONS:  None.   DRAINS:  One Hemovac.   INDICATIONS FOR PROCEDURES:  This 70 year old female presented with  severe onset of leg pain and weakness, had an MRI scan, which was  interpreted as a free fragment.  She had S1 weakness and had been  stumbling and falling, and severe pain requiring around-the-clock  medication and she was not able to tolerate the pain.   DESCRIPTION OF PROCEDURES:  After induction of anesthesia, the patient  was placed prone, padded in position.  Time-out was taken.  Standard  prepping and draping with squaring the areas with towels, Betadine Vi-  Drape and laminectomy sheet.  A needle was placed at the 5-1 disk space  and confirmed with a cross-table lateral x-ray.  Incision was made just  to the right of the midline.  Subperiosteal dissection down to the  lamina.  A self-retaining retractor was placed.  Initially, the lamina  was removed, since the fragment had migrated cephalad from the disk  space and was at the level of the pedicle.  About 1/2 of the right side  of the lamina at L5 was removed.  A small amount of the top of S1, and  all the ligament were resected.  There were minimal overhang spurs.  The  disk space was inspected and there was a small annular rent with a  small  fragment present, but this was very small in comparison to the MRI scan,  which showed a large mass at the level of the pedicle.  Dissection  continued up underneath the nerve root.  The pedicle was noted and  continued removal of ligaments in the lateral gutter on the right side  until it was above the nerve root.  Looking in the axilla and across the  midline, with careful probing using a ball-tip nerve hook, a Black nerve  hook, a hockey stick, palpation all the way across the opposite pedicle,  looking above and below, there was no disk fragment to be found that  matched the MRI scan, which showed a large fragment.  Skipping above the  nerve root on the right side, we went all the way up to the top of the  lamina of 5, took all the lamina of 5 off, and then could see and  palpate the disk space at L4-5 with the tip of a hockey stick and there  were no  fragments to be seen.  There were copious epidural bleeders and  a pocket was found that was consistent with epidural hematoma.  Continued looking for a free fragment was performed and palpation.  I  called and talked with the radiologist, Dr. Frazier Richards, and he felt that  the findings on the MRI could certainly represent an epidural hematoma  and not a fragment, as initially suspected.  The foramen was enlarged.  Bipolar cautery was used with some epidural bleeding.  With the dura  completely decompressed, palpation over the top of the dura was  performed with the D[place], as well as with the hockey stick.  There  were no masses present or areas of fullness that could represent a large  fragment behind.  After irrigation with saline solution, the fascia was  reapproximated with 0  Vicryl, 2-0 Vicryl for the subcutaneous tissue, 4-0 subcuticular  closure.  Dressing was applied.  The drain was taped, and the patient  was transferred to the recovery room in stable condition.  Instrument  count and needle count were correct.       Mark C. Ophelia Charter, M.D.  Electronically Signed     MCY/MEDQ  D:  06/19/2006  T:  06/19/2006  Job:  [id number]

## 2010-10-15 NOTE — Discharge Summary (Signed)
NAME:  [name], LACSON                          ACCOUNT NO.:  [id number]   MEDICAL RECORD NO.:  [id number]                   PATIENT TYPE:  AMB   LOCATION:  SDC                                  FACILITY:  [facility]   PHYSICIAN:  James A. Ashley Royalty, M.D.             DATE OF BIRTH:  [birth year]   DATE OF ADMISSION:  DATE OF DISCHARGE:                                 DISCHARGE SUMMARY   HISTORY OF PRESENT ILLNESS:  This is a 70 year old female referred through  the courtesy of Amy Elisabeth Most for post menopausal bleeding.  The bleeding  started on or about April 08, 2003.  She has been on Prempro 0.625 mg  daily for many years.  Pelvic exam was attempted in the office April 14, 2003 and revealed cervical stenosis which did not allow an endocervical Pap  smear specimen to be obtained.  The patient is for diagnostic/operative  hysteroscopy and dilatation and curettage.   MEDICATIONS:  1. Prempro 0.625 mg daily.  2. Topamax 100 mg two q.a.m., two q.h.s.  3. Atenolol 50 mg one-half tablet per day.  4. Lorazepam 5 mg t.i.d.  5. Prilosec 20 mg daily.  6. Detrol 4 mg daily.  7. Levoxyl 175 mcg one p.o. daily.  8. Tetracycline 25 mg q.i.d.   PAST MEDICAL HISTORY:  1. Medical:  Arthritis (unknown type), anxiety disorder (details lacking),     urinary incontinence - urge (by history), hypothyroidism.  2. Surgical:  Breast biopsy 15 years ago - benign, three knee surgeries,     hand surgery, foot surgery.   ALLERGIES OR INTOLERANCES:  1. CELEBREX.  2. VAGISIL.  3. PYRIDIUM.  4. GLUCOSAMINE.  5. SULFA.  6. NIACIN.  7. Two others noted on the patient's office chart which are illegible.   FAMILY HISTORY:  Noncontributory.   SOCIAL HISTORY:  The patient denies use of tobacco or significant alcohol.   REVIEW OF SYSTEMS:  Noncontributory.   PHYSICAL EXAMINATION:  GENERAL:  Well-developed, well-nourished, pleasant  female in no acute distress.  VITAL SIGNS:  Afebrile; vital signs stable.  SKIN:  Warm and dry without lesions.  LYMPH:  There is no supraclavicular, cervical, or inguinal adenopathy.  HEENT:  Normocephalic.  NECK:  Supple without thyromegaly.  CHEST:  Lungs are clear.  CARDIAC:  Regular rate and rhythm without murmurs, gallops, or rubs.  BREASTS:  Soft and nontender without masses, discharge, retraction, or  adenopathy.  ABDOMEN:  Soft and nontender without masses or organomegaly.  Bowel sounds  are active.  MUSCULOSKELETAL:  Reveals full range of motion with edema, cyanosis, or CVA  tenderness.  PELVIC:  (April 14, 2003) External genitalia within normal limits.  The  vagina and cervix were without gross lesions.  The cervix is stenotic.  Bimanual examination revealed the uterus to be approximately 8 x 4 x 4 cm  and no adnexal masses are palpable.   IMPRESSION:  1.  Post menopausal bleeding - rule out neoplasia or intrauterine pathology.  2. Cervical stenosis.  3. Urinary incontinence - urge by history.  4. Hypothyroidism.  5. Anxiety disorder (by history).  6. Numerous drug allergies.   PLAN:  1. Diagnostic/operative hysteroscopy.  2. Dilatation and curettage.  3. Pap smear.   Risks, benefits, complications, and alternatives fully discussed with the  patient.  She states she understands and accepts.  Questions invited and  answered.                                               James A. Ashley Royalty, M.D.    JAM/MEDQ  D:  05/07/2003  T:  05/07/2003  Job:  [id number]

## 2010-10-15 NOTE — Op Note (Signed)
[name], [name]                ACCOUNT NO.:  [id number]   MEDICAL RECORD NO.:  [id number]          PATIENT TYPE:  AMB   LOCATION:  SDC                           FACILITY:  [facility]   PHYSICIAN:  Duke Salvia. Marcelle Overlie, M.D.DATE OF BIRTH:  August 21, 1940   DATE OF PROCEDURE:  10/01/2004  DATE OF DISCHARGE:                                 OPERATIVE REPORT   PREOPERATIVE DIAGNOSIS:  Postmenopausal bleeding, possible endometrial  polyp.   POSTOPERATIVE DIAGNOSIS:  Postmenopausal bleeding, possible endometrial  polyp.   PROCEDURE:  Dilatation and curettage, hysteroscopy.   SURGEON:  Duke Salvia. Marcelle Overlie, M.D.   ANESTHESIA:  General.   COMPLICATIONS:  None.   ESTIMATED BLOOD LOSS:  Minimal.   SPECIMENS REMOVED:  Endometrial curettings.   PROCEDURE AND FINDINGS:  The patient was taken to the operating room.  After  an adequate level of general anesthesia was obtained with the patient's legs  in stirrups, the vulva and vagina were prepped with Betadine, the bladder  was drained, EUA carried out.  The uterus was small, midposition, adnexa  negative.  The speculum was positioned, cervix grasped with a tenaculum, a  paracervical block created by infiltrating at 3 and 9 o'clock submucosally,  5-7 mL of 1% Xylocaine after negative aspiration.  This was done primarily  to help with postoperative cramping.  The uterus was then sounded to 7 cm,  was progressively dilated to a 27 Pratt easily.  The diagnostic hysteroscope  was then inserted.  A small area that appeared to be polypoid was noted  along the left posterior wall.  D&C was carried out and minimal tissue was  removed.  The scope was reinserted and the cavity noted to be clean.  There  was minimal bleeding.  She tolerated this well and went to the recovery room  in good condition.      RMH/MEDQ  D:  10/01/2004  T:  10/01/2004  Job:  [id number]

## 2010-10-15 NOTE — Assessment & Plan Note (Signed)
[facility]                             PULMONARY OFFICE NOTE   [name], [name]                       MRN:          [id number]  DATE:08/15/2006                            DOB:          [birth year]    PULMONARY FOLLOWUP   PROBLEMS:  1. Allergic rhinitis.  2. Eustachian dysfunction.  3. Dyspnea.  4. Mitral valve prolapse.  5. Esophageal reflux.  6. Osteoporosis.  7. Tardive dyskinesia.   HISTORY:  She has recently had lumbosacral spine surgery and is getting  over that, but tells me she is pending hysterectomy and bladder tac in  May.  She stopped her allergy shots in January and we are going to leave  her off of those until she is mobile again after her hysterectomy, and  then see how she is doing.  She is not going to be outdoors much in the  next month or 2, until she can get back to driving.  She was trying  Lyrica for pain and felt that it caused insomnia with a lot of kicking,  tossing, and turning.  We are satisfied after discussion for her just to  adjust the timing of any Lyrica she needs, and to continue efforts of  good sleep hygiene rather than adding yet more medication to her long  list.   MEDICATIONS:  1. Our current list includes ranitidine 300 mg.  2. Topamax 200 mg times 1-and-a-half.  3. Atenolol 50 mg x1/2.  4. Lorazepam 0.5 mg b.i.d.  5. Prilosec OTC b.i.d.  6. Vitamin E 1600 units.  7. Multivitamins.  8. Calcium.  9. Vitamin D.  10.Allergy vaccine on hold.  11.Levoxyl 175 mcg.  12.Citrucel t.i.d.  13.Naproxen 500 mg b.i.d.  14.Aspirin 81 mg.  15.GlycoLax 17 g.  16.Lovastatin 40 mg.  17.Soy Balance for hot flashes.  18.P.r.n. use of Meclizine 25 mg x1/2 q.6h p.r.n.  19.Promethazine 25 mg x1/2 q.6h.  20.Boniva once a month.  21.Fluticasone nasal spray.  22.Timolol 0.5% gel solution.  23.Alavert 1 a day p.r.n. for rhinitis or Astelin nasal spray once or      twice each nostril b.i.d. p.r.n.  24.Lyrica.   DRUG INTOLERANCES:  CELEBREX, VAGISIL, PERIDIUM, GLUCOSAMINE, SULFA,  NIACIN, TROVAN, TRILAFON, HISTINEX HC, REGLAN.   OBJECTIVE:  Weight 144 pounds, BP 122/80, pulse regular 84, room air  saturation 99%.  Oropharynx is clear.  Voice quality normal.  Nasal airway does not look  obstructive or edematous.  No neck vein distension or stridor.  Lung fields are clear and quiet with unlabored breathing.  No cough or  wheeze.  HEART:  Regular.  I do not hear a murmur or gallop.  EXTREMITIES:  Without cyanosis, clubbing, or edema.   IMPRESSION:  Allergic rhinitis with long complicated list of problems  and medications as above.  Currently, she is stable.   PLAN:  She is going to stay off of allergy vaccine for now.  We will  schedule a return in 4 months and reassess at that point, earlier p.r.n.     Clinton D. Maple Hudson, MD, FCCP, FACP  Electronically Signed    CDY/MedQ  DD: 08/15/2006  DT: 08/16/2006  Job #: [id number]   cc:   Loraine Leriche C. Ophelia Charter, M.D.  Richard M. Marcelle Overlie, M.D.

## 2010-10-15 NOTE — Op Note (Signed)
NAME:  [name], [name]                          ACCOUNT NO.:  [id number]   MEDICAL RECORD NO.:  [id number]                   PATIENT TYPE:  AMB   LOCATION:  SDC                                  FACILITY:  [facility]   PHYSICIAN:  James A. Ashley Royalty, M.D.             DATE OF BIRTH:  November 24, 1940   DATE OF PROCEDURE:  08/20/2003  DATE OF DISCHARGE:                                 OPERATIVE REPORT   PREOPERATIVE DIAGNOSIS:  Pelvic pain.   POSTOPERATIVE DIAGNOSIS:  1 cm fundal uterine fibroid, otherwise, completely  normal pelvis.   PROCEDURE:  Diagnostic laparoscopy.   SURGEON:  Rudy Jew. Ashley Royalty, M.D.   ANESTHESIA:  General.   ESTIMATED BLOOD LOSS:  Less than 25 mL.   COMPLICATIONS:  None.   PACKS AND DRAINS:  None.   PROCEDURE:  The patient was taken to the operating room and placed in the  dorsal supine position.  After adequate general anesthesia was administered,  she was placed in the lithotomy position, prepped and draped in the usual  manner for abdominal and vaginal surgery.  A bivalve speculum was placed in  the vagina and the anterior lip of the cervix was grasped with a single  tooth tenaculum.  The uterine manipulator was placed per cervix and held in  place with a tenaculum.  The bladder was drained with a red rubber catheter.  After 2 mL of 0.25% Marcaine was instilled in the inferior aspect of the  umbilicus, a longitudinal incision was made approximately 1.1 cm in length.  The subcutaneous tissues were sharply dissected down.  The Veress needle was  placed into the abdominal cavity.  It was located and verified by placement  of saline and hanging drop technique.  Approximately 2.5 liter of CO2 were  instilled at 1 liter per minute to create a pneumoperitoneum.  Next, the  10/11 disposable laparoscopic trocar was placed in the abdominal cavity.  Its location was verified by placement of the laparoscope.  There was no  evidence of any trauma.  Next, a 5 mm suprapubic  trocar was placed in the  left lower quadrant using transillumination and direct visualization  techniques.  The pelvis was thoroughly surveyed.  The uterus was normal  size, shape, and contour with the exception of a less than or equal to 1 cm  fundal fibroid on the patient's right side.  This does not appear to the  operator to be a significant cause of any pelvic pain.  The ovaries were  normal size, shape, and contour bilaterally without evidence of any cysts or  endometriosis.  The fallopian tubes were normal size, shape, contour, and  length bilaterally.  The anterior and posterior cul-de-sacs were clean.  The  remainder of the peritoneal surfaces were smooth and glistening.  While  inspecting the pelvis, the operator noted what looked like a possible  adhesion next to the superior trocar site.  For this reason, a 5 mm camera  was inserted through the inferior trocar site and when inspected from below,  no adhesion was noted, whatsoever.   At this point, the patient was felt to have benefited maximally from the  surgical procedure.  The abdominal instruments were removed,  pneumoperitoneum was evacuated.  The fascial defects were closed with 0  Vicryl in an interrupted fashion.  The skin was closed with 3-0 Monocryl in  a subcuticular fashion superiorly and with Steri-  Strips inferiorly.  The vaginal instruments were removed and hemostasis  noted.  The remainder of the 10 mL of 0.25% Marcaine was instilled into the  surgical sites to aid in postoperative analgesia.  The patient tolerated the  procedure well and was returned to the recovery room in stable condition.                                               James A. Ashley Royalty, M.D.    JAM/MEDQ  D:  08/20/2003  T:  08/21/2003  Job:  [id number]

## 2010-10-15 NOTE — Op Note (Signed)
NAME:  [name], [name]                          ACCOUNT NO.:  [id number]   MEDICAL RECORD NO.:  [id number]                   PATIENT TYPE:  AMB   LOCATION:  SDC                                  FACILITY:  [facility]   PHYSICIAN:  James A. Ashley Royalty, M.D.             DATE OF BIRTH:  [birth year]   DATE OF PROCEDURE:  05/07/2003  DATE OF DISCHARGE:  05/07/2003                                 OPERATIVE REPORT   PREOPERATIVE DIAGNOSES:  1. Postmenopausal bleeding - rule out neoplasia or structural abnormality.  2. Cervical stenosis.   POSTOPERATIVE DIAGNOSES:  1. Postmenopausal bleeding - rule out neoplasia or structural abnormality.     Pathology pending.  2. Suboptimal study.   PROCEDURE:  1. Diagnostic hysteroscopy.  2. Dilatation and curettage.   SURGEON:  Rudy Jew. Ashley Royalty, M.D.   ANESTHESIA:  Monitored anesthesia care.   COMPLICATIONS:  None.   ESTIMATED BLOOD LOSS:  Less than 50 mL.   DESCRIPTION OF PROCEDURE:  The patient was taken to the operating room and  placed in the dorsal supine position.  After adequate IV sedation was  administered she was placed in the lithotomy position and prepped and draped  in the usual manner for vaginal surgery.  A posterior weighted rectractor  was placed per vagina.  The anterior lip of the cervix was grasped with a  single tooth tenaculum.  An attempt was made to place the smallest dilator  in the cervix but the os would not admit the diameter of the dilator due to  stenosis.  The cervix was then probed with lacrimal ducts and an os  identified.  Next, Pratt dilators were used to dilate the cervix.  The  intrauterine sound was used to obtain a depth and direction.  The uterus was  noted to be slightly anteverted and sounded to only 6 cm.  The cervix was  then dilated to a size 29 [place] using [employer] dilators.  The resectoscope was  then placed using sorbitol as a distention medium.  Interestingly, the  operator noted that for reasons that were  unclear the hysteroscope could not  be advanced fully into the uterine cavity to the point of being able to see  the tubal ostia.  However, there were no structural abnormalities noted in  the field of vision such as polyps or fibroids.  The endocervix was  similarly benign.  Appropriate photos were obtained.   Attention was then turned to the uterine curettage.  A medium-sized curette  was inserted into the uterine cavity.  A four quadrant technique was  employed initially.  Then, a therapeutic technique was employed.  The  curettings were submitted to pathology for histologic studies.  At this  point the patient was felt to have benefited maximally from the surgical  procedure though suboptimal with respect to visualization.  It was felt in  the context of this clinical setting that further  attempts to characterize  the cavity would be at significant risk for perforation and the main  objectives of the procedure had been successfully accomplished.   The vaginal instruments were removed, hemostasis noted, and the procedure  terminated.   The patient was then taken to the recovery room in excellent condition.                                               James A. Ashley Royalty, M.D.    JAM/MEDQ  D:  05/13/2003  T:  05/13/2003  Job:  [id number]

## 2010-10-15 NOTE — Discharge Summary (Signed)
[name], [name]                ACCOUNT NO.:  [id number]   MEDICAL RECORD NO.:  [id number]          PATIENT TYPE:  INP   LOCATION:  5002                         FACILITY:  [facility]   PHYSICIAN:  Mark C. Ophelia Charter, M.D.    DATE OF BIRTH:  10-06-1940   DATE OF ADMISSION:  09/05/2005  DATE OF DISCHARGE:  09/09/2005                                 DISCHARGE SUMMARY   DISCHARGE DIAGNOSES:  1.  Left knee osteoarthritis.  2.  Mitral valve prolapse.  3.  Blood loss postoperative anemia.  4.  Hypertension.  5.  Esophageal reflux.  6.  History of depression.  7.  Longterm narcotic usage.  8.  Hypothyroidism.   HISTORY OF PRESENT ILLNESS:  This 70 year old female has had progressive  knee pain on the left with grade 4 chondromalacia, bone-on-bone changes,  previous arthroscopic debridement, pain progressing to the point where she  is having difficulty ambulating.  She is on a long list of medications  including Ranitidine, Topamax, Atenolol, Lorazepam, Prilosec, vitamins,  calcium, Levoxyl, Citrucel, Naprosyn, Meclizine, aspirin, stool softener,  __________, Promethazine, Lovastatin, Boniva, Neosporin, Estrace, Timolol.   ALLERGIES:  CELEBREX, PYRIDIUM, GLUCOSAMINE, SULFA, NIACIN, ROBITUSSIN.   She has been on Vicodin and Percocet in the past.   HOSPITAL COURSE:  The patient was admitted and after informed consent  underwent left total knee arthroplasty on September 05, 2005, using computer  assistance.  Tourniquet time was 1 hour 18 minutes.  Estimated blood loss  was minimal.  Components used was a Dupuy cruciate substituting PFC Sigma  knee rotating platform, 2.5 tibia, 2.5 tibial tray, and 2.5 femur.  32 mm  patellar dome and 10 mm polyethylene tibial tray.   Postoperatively, the patient had some decreased blood pressure secondary to  morphine usage.  INR was 2.0 on postoperative day #2.  Hemoglobin was 9.0  and it was 13 preoperatively.  Platelets 153,000.  She had good urine output  and  progressed well with physical therapy.  Had good flexion.  On  postoperative day #4, she had a temperature of 101.6 which responded to  incentive spirometry and decreasing narcotics.  The patient did not want a  transfusion and hemoglobin was 7.9.  She was ambulatory without orthostatic  changes.  Flexion greater than 90 noted on September 09, 2005.  Office follow-up  appointment in 1-1/2 weeks.  She was taking Tylox and iron at the time  of discharge and hemoglobin was stable.  Due to INR of 1.8 after no Coumadin  for several days, she was just left off the Coumadin and we will allow this  to gradually reverse on her own.   CONDITION ON DISCHARGE:  Satisfactory.      Mark C. Ophelia Charter, M.D.  Electronically Signed     MCY/MEDQ  D:  10/02/2005  T:  10/03/2005  Job:  [id number]

## 2010-10-15 NOTE — Assessment & Plan Note (Signed)
[facility]                             PULMONARY OFFICE NOTE   [name], [name]                       MRN:          [id number]  DATE:05/17/2006                            DOB:          1940-11-07    ENT PHYSICIAN:  Dr. Dillard Cannon.   GYN PHYSICIAN:  Dr. Marcelle Overlie.   PROBLEM:  1. Allergic rhinitis.  2. Eustachian dysfunction.  3. Dyspnea.  4. Mitral valve prolapse.  5. Esophageal reflux.  6. Osteoporosis.  7. Tardive dyskinesia.   HISTORY:  Since I saw her in mid November, she has seen Dr. Ezzard Standing again  for head congestion.  She is now using Echinacea and Cepacol spray, and  occasional Tylenol for her sense of head congestion.  She is pending  hysterectomy and bladder tack in January.  Has been doing saline nasal  rinses at night.  She does not get much cough or chest tightness, but  sometimes feels drainage causing cough at the back of her throat.  Not  much headache, and nothing frankly purulent.   MEDICATIONS:  1. Remefemin  mg.  2. Zantac 300 mg.  3. Prilosec.  4. Lovastatin 40 mg.  5. Naproxen 500 mg b.i.d.  6. Lorazepam 0.5 mg t.i.d.  7. Aspirin 81 mg.  8. Levoxyl 175 mcg.  9. Citracal with vitamin D.  10.Atenolol 1/2 x50 mg.  11.Topamax 200 mg b.i.d.  12.Promethazine 1/2 or 1 x25 mg every 6 hours or meclizine 1/2 of 25      mg every 6 hours p.r.n. for nausea.  13.Timolol 0.5% eye drops left eye each morning.  14.Boniva 150 mg once a month.  15.Esterase 0.1% 2 to 3 times weekly.  16.Astelin nasal spray.   DRUG INTOLERANT:  1. SULFA.  2. TESSALON PEARLS.  3. NIACIN.  4. TROVAN.  5. CELEBREX.  6. VAGISIL.  7. PYRIDIUM.  8. GLUCOSAMINE.  9. VICODIN.  10.REGLAN.   OBJECTIVE:  There is thick creamy mucus in the right nares and moderate  turbinate edema on the left.  After a Neo-Synephrine nasal inhalation  treatment, she is left with mucus bridging.  I do not see polyps.  There  is no post pharyngeal drainage.   She is minimally puffy around the eyes  with no conjunctival injection.  No cervical adenopathy.  Voice quality  is normal.  CHEST:  Clear.  HEART:  Sounds regular without murmur.   IMPRESSION:  Rhinitis or rhinosinusitis.   PLAN:  1. She was given nasal inhalation treatment with Neo-Synephrine.  2. CT scan of sinuses.  3. After her hysterectomy, we will advance her allergy vaccine to      1:10.  4. Schedule return in 3 months, earlier p.r.n.     Clinton D. Maple Hudson, MD, Tonny Bollman, FACP  Electronically Signed    CDY/MedQ  DD: 05/17/2006  DT: 05/17/2006  Job #: [id number]   cc:   Duke Salvia. Marcelle Overlie, M.D.

## 2010-10-15 NOTE — H&P (Signed)
NAME:  [name], [name]                          ACCOUNT NO.:  [id number]   MEDICAL RECORD NO.:  [id number]                   PATIENT TYPE:  AMB   LOCATION:  SDC                                  [facility]   PHYSICIAN:  James A. Ashley Royalty, M.D.             DATE OF BIRTH:  04-22-1941   DATE OF ADMISSION:  08/20/2003  DATE OF DISCHARGE:                                HISTORY & PHYSICAL   HISTORY OF PRESENT ILLNESS:  This is a 70 year old white female originally  referred to me for evaluation of postmenopausal bleeding.  She had a  diagnostic/operative hysteroscopy and D&C performed May 07, 2003.  The  hysteroscopy was suboptimal in that the hysteroscope could not be advanced  fully into the uterine cavity.  However, no abnormalities were obtained.  She subsequently underwent CT scan of the abdomen and pelvis which was  completely normal.  She has had a GI workup by Dr. Randa Evens as well as a  general workup by Dr. Marisue Brooklyn.  She states she is experiencing  debilitating pelvic pain and seeks surgical intervention for same.   Ultrasound was performed August 14, 2003 and revealed a normal uterus and  right adnexa.  The left adnexa was not visualized.  She is hence for  diagnostic/operative laparoscopy due to debilitating pelvic pain.   MEDICATIONS:  1. Prempro 0.625/12.5 mg one daily.  2. Topamax 100 mg two tablets b.i.d.  3. Atenolol 50 mg one-half tablet per day.  4. Lorazepam 0.5 mg t.i.d.  5. Prilosec 20 mg one daily.  6. Multivitamin.  7. Atrovent nasal spray for allergies.  8. Rhinocort.  9. Detrol LA 4 mg tablet daily.  10.      Levoxyl 175 mcg daily.  11.      Tetracycline 250 mg capsule q.i.d.   PAST MEDICAL HISTORY:  Medical:  History of arthritis - unknown type,  anxiety disorders, urinary incontinence - urge by history, hypothyroidism.   SURGICAL:  1. Left breast biopsy - benign.  2. Three knee surgeries per Dr. Criss Alvine.  3. Hand surgery.  4. Foot surgery.   ALLERGIES:  1. CELEBREX.  2. VAGISIL.  3. PYRIDIUM.  4. GLUCOSAMINE.  5. SULFA.  6. NIACIN.  7. TROVAN.   FAMILY HISTORY:  Noncontributory.   SOCIAL HISTORY:  The patient denies use of tobacco or significant alcohol.   REVIEW OF SYSTEMS:  Noncontributory.   PHYSICAL EXAMINATION:  GENERAL:  Well-developed,well-nourished, pleasant  white female in no acute distress.  VITAL SIGNS:  Afebrile, vital signs stable.  SKIN:  Warm and dry without lesions.  LYMPH:  There is no supraclavicular, cervical, or inguinal adenopathy.  HEENT:  Normocephalic.  NECK:  Supple without thyromegaly.  CHEST:  Lungs are clear.  CARDIAC:  Regular rate and rhythm without murmurs, gallops, or rubs.  BREAST:  Deferred.  ABDOMEN:  Soft and nontender without masses or organomegaly.  Bowel sounds  are active.  MUSCULOSKELETAL:  Reveals full range of motion without edema, cyanosis, or  CVA tenderness.  PELVIC:  External genitalia within normal limits.  Vagina and cervix are  without gross lesions.  Bimanual examination revealed the uterus to be  approximately 8 x 4 x 4 cm and no adnexal masses were palpable.   IMPRESSION:  1. Pelvic pain - etiology uncertain.  Differential includes musculoskeletal,     primary gastrointestinal, endometriosis, adhesions, etc.  2. History of urinary incontinence - urge by history.  3. Hypothyroidism.  4. Anxiety disorder.  5. Numerous drug allergies.  6. History of cervical stenosis.   PLAN:  Diagnostic/operative laparoscopy.  Risks, benefits, complications,  and alternatives fully discussed with the patient.  The possibility of  unilateral salpingo-oophorectomy discussed and accepted.  The possibility of  exploratory laparotomy discussed and accepted.  Questions invited and  answered.                                               James A. Ashley Royalty, M.D.    JAM/MEDQ  D:  08/20/2003  T:  08/20/2003  Job:  [id number]

## 2010-10-15 NOTE — Assessment & Plan Note (Signed)
[facility]                               PULMONARY OFFICE NOTE   TARRY, BLAYNEY                       MRN:          [id number]  DATE:03/27/2006                            DOB:          1941-03-20    PROBLEM LIST:  1. Allergic rhinitis.  2. Eustachian dysfunction.  3. Dyspnea.  4. Mitral valve prolapse.  5. Esophageal reflux.  6. Osteoporosis.   PRIMARY CARE PHYSICIAN:  Rosalyn Gess. Norins, M.D.   EARS/NOSE/THROAT PHYSICIAN:  Kristine Garbe. Ezzard Standing, M.D.   HISTORY:  She has seen Dr. Ezzard Standing whose impressions were chronic rhinitis  with post nasal drainage and questionable infectious rhinitis secondary  eustachian tube dysfunction.  He gave her a 10-day course of amoxicillin and  recommended continued use of her nasal steroid spray.  She says she still  feels head congestion and describes some tinnitus which we discussed.  She  has been trying to do some nasal saline lavage.  Because of dryness she says  Dr. Ezzard Standing questioned the possibility of lupus which we discussed.  She has  not had rash or arthralgias.   MEDICATIONS:  Her extensive list is charted and reviewed significant now for  a nasal rinse, Astelin.  She is off of Nasonex.   OBJECTIVE:  VITAL SIGNS:  Weight 138 pounds, BP 132/72, pulse regular 82,  room air saturation 99%.  HEENT:  Oral mucosa is dry as tested by tongue blade.  Nasal airway looks  clear.  I do not see evidence of drainage or adenopathy.  Conjunctivae are  not injected.  LUNGS:  Fields are clear to P&A.  HEART:  Sounds regular without murmur or gallop.   IMPRESSION:  1. Rhinitis or rhinosinusitis noting a sinus CT a year ago had shown      minimal changes.  2. Dryness most likely is from her medications but I cannot exclude the      possibility of a disease process and we will check for evidence of      lupus as suggested by Dr. Narda Bonds.  3. Tinnitus maybe from her aspirin.   PLAN:  1. Try stopping  her aspirin for 1-2 weeks to see if her tinnitus resolves.  2. Blood is sent for ANA and sedimentation rate.  3. Schedule return in 4 months, earlier p.r.n.     Clinton D. Maple Hudson, MD, Tonny Bollman, FACP  Electronically Signed    CDY/MedQ  DD: 03/28/2006  DT: 03/29/2006  Job #: [id number]

## 2010-10-15 NOTE — Assessment & Plan Note (Signed)
[facility]                               PULMONARY OFFICE NOTE   [name], [name]                       MRN:          [id number]  DATE:03/08/2006                            DOB:          04-30-1941    HISTORY OF PRESENT ILLNESS:  The patient is a 70 year old white female, a  patient of Dr. Maple Hudson, who has a known history of allergic rhinitis who  presents for acute office visit. The patient complains that approximately  four weeks ago she got a small what appeared to be bug bite along her left  shoulder area. This resolved, however, over the last two days she has  noticed that she has developed several small raised red areas along that  area and along her left cheek area. She denies any pruritus, pain, tingling,  paresthesias, change of skin products, recent travel or antibiotic use.   PAST MEDICAL HISTORY:  Reviewed.   CURRENT MEDICATIONS:  Reviewed.   PHYSICAL EXAMINATION:  The patient is a pleasant, elderly female in no acute  distress. She is afebrile with stable vital signs. O2 saturation is 99% on  room air.  HEENT: Posterior pharynx is clear.  NECK:  Neck is supple without adenopathy.  LUNGS:  Sounds are clear.  CARDIAC: Regular rate.  EXTREMITIES: Are warm without any edema.  SKIN: Reveals a small white/red papule along the anterior left shoulder and  very small pinpoint three papules along the left cheek area. No vesicles are  noted. No significant erythema is noted.   IMPRESSION/PLAN:  Rash, questionable etiology. Possibly secondary reaction  to previous bug bite. Skin does not appear to be infected. This also does  not, even though it is unilateral, does not appear to be shingles. It is not  consistent with herpes zoster. Therefore, will use an over-the-counter  hydrocortisone cream on the left shoulder papule twice daily x 5 days. The  patient is to use Claritin daily x 5 days. Clean areas with soap and water.  If symptoms do  not resolve, may need referral to Dermatology.      ______________________________  Rubye Oaks, NP    ______________________________  Rennis Chris. Maple Hudson, MD, [facility], FACP   TP/MedQ  DD:  03/08/2006  DT:  03/09/2006  Job #:  [id number]

## 2010-10-15 NOTE — Assessment & Plan Note (Signed)
[facility]                               PULMONARY OFFICE NOTE   [name], [name]                         MRN:          [id number]  DATE:04/17/2006                            DOB:          [birth year]    PRIMARY PHYSICIAN:  Dr. Illene Regulus   ENT PHYSICIAN:  Dr. Dillard Cannon   PROBLEMS:  1. Allergic rhinitis.  2. Eustachian dysfunction.  3. Dyspnea.  4. Mitral valve prolapse.  5. Esophageal reflux.  6. Osteoporosis.  7. Tardive dyskinesia.  8. She says she is not too good because she is having more problems with      nasal congestion.  She is waking about 4 a.m. to do saline sinus lavage      and then she repeats that at 5 a.m.  She says after that her sinuses      will drain, she will get postnasal drainage with purulent mucus from      her throat.  Her chest has been clear and she is not running fever or      headache.  She is now pending hysterectomy and a bladder tack in      January.  Dr. Ezzard Standing had raised the possibility of lupus because of her      complaints of dryness.  An ANA was negative and sedimentation rate      normal at 18, so significant lupus is excluded.  CT of the sinuses in      December 2006 had shown minimal scattered mucosal thickening, nasal      septal deviation with a concha bullosa of right middle turbinate.   MEDICATION:  Her extensive list was reviewed again with her and is charted.   OBJECTIVE:  Weight 136 pounds, blood pressure 132/84, pulse regular 74, room  air saturation 99%.  There is definite green postnasal drainage with no  periorbital edema.  Mild nasal turbinate edema.  Lung fields are quiet and  clear with no cough or wheeze.  Heart sounds regular without murmur.   IMPRESSION:  Probable sinusitis with a background of allergic rhinitis.   PLAN:  1. Avelox 400 mg daily for 10 days.  2. Continue saline lavage.  3. Schedule return 1 month, earlier p.r.n.     Clinton D. Maple Hudson, MD, Tonny Bollman,  FACP  Electronically Signed   CDY/MedQ  DD: 04/18/2006  DT: 04/19/2006  Job #: [id number]

## 2010-10-15 NOTE — Op Note (Signed)
   NAME:  [name], [name]                          ACCOUNT NO.:  [id number]   MEDICAL RECORD NO.:  [id number]                   PATIENT TYPE:  AMB   LOCATION:  ENDO                                 FACILITY:  [facility]   PHYSICIAN:  James L. Malon Kindle., M.D.          DATE OF BIRTH:  13-Mar-1941   DATE OF PROCEDURE:  04/03/2003  DATE OF DISCHARGE:                                 OPERATIVE REPORT   PROCEDURE:  Colonoscopy.   MEDICATIONS:  Fentanyl 75 mcg and Versed 10 mg IV.   SCOPE:  Pediatric Olympus colonoscope.   INDICATION:  Rectal bleeding.   DESCRIPTION OF PROCEDURE:  The procedure had been explained to the patient  and consent obtained.  With the patient in the left lateral decubitus  position, the Olympus scope was inserted and advanced under direct  visualization.  The prep was excellent.  We were able to advance easily to  the cecum after passing a very tortuous sigmoid colon with a lot of  diverticulosis.  The ileocecal valve and appendiceal orifice were seen.  There were no signs of recent or active bleeding in the cecum.  The scope  was withdrawn to the cecum.  The ascending colon, transverse colon,  descending, and sigmoid colon were seen well.  No polyps or other lesions  were seen.  Marked diverticular disease in the sigmoid colon.  No polyps  were seen, and the scope was drawn down into the rectum.  The rectum was  free of polyps.  The scope withdrawn.  The patient tolerated the procedure  well.   ASSESSMENT:  1. Rectal bleeding, probably due to internal hemorrhoids.  2. Diverticulosis.   PLAN:  Will give diverticulosis and hemorrhoid information sheet.  Start on  fiber supplements and see back in the office in two months.                                               James L. Malon Kindle., M.D.    Waldron Session  D:  04/03/2003  T:  04/03/2003  Job:  [id number]   cc:   Lovenia Kim, D.O.  [address]  [place]  [place] [address]  Fax: [phone]

## 2010-10-15 NOTE — Assessment & Plan Note (Signed)
[facility]                                 ON-CALL NOTE   [name], [name]                       MRN:          [id number]  DATE:05/29/2006                            DOB:          [birth year]    The patient's phone number is [phone].   Call received on the 31st of December at 6:45 in the afternoon.   [name] calls stating that she was supposed to have a prescription  called for her by Dr. Maple Hudson after results of a CT scan, which were  discussed with her 3 days prior. The patient cannot elaborate on what  type of scan, nor what type of medication that Dr. Maple Hudson wanted to give  her. I was unable to help the patient further as I was not sure exactly  what she needed. I subsequently paged Dr. Maple Hudson, who was available and  said he would handle the matter and take care of calling in a  prescription. The patient apparently has sinusitis. The patient was  satisfied with the above response.     Gailen Shelter, MD  Electronically Signed    CLG/MedQ  DD: 05/29/2006  DT: 05/29/2006  Job #: [id number]   cc:   Joni Fears D. Maple Hudson, MD, FCCP, FACP

## 2010-11-01 ENCOUNTER — Encounter: Payer: Self-pay | Admitting: Internal Medicine

## 2010-11-11 ENCOUNTER — Ambulatory Visit (INDEPENDENT_AMBULATORY_CARE_PROVIDER_SITE_OTHER): Payer: Medicare Other | Admitting: Internal Medicine

## 2010-11-11 VITALS — BP 128/72 | HR 78 | Temp 98.1°F | Wt 158.0 lb

## 2010-11-11 DIAGNOSIS — R229 Localized swelling, mass and lump, unspecified: Secondary | ICD-10-CM

## 2010-11-12 NOTE — Progress Notes (Signed)
  Subjective:    Patient ID: [name], female    DOB: Dec 01, 1940, 70 y.o.   MRN: [id number]  HPI [name] is concerned about several small bumps/nodules she has found on her scalp. She has had no fever, chills, drainage from the bumps. She has used no new hair products. She does have small subcutaneous nodules in a wide distribution including arms and legs. These are non-tender.  PMH, FamHx and SocHx reviewed for any changes and relevance.    Review of Systems Review of Systems  Constitutional:  Negative for fever, chills, activity change and unexpected weight change.  HEENT:  Negative for hearing loss, ear pain, congestion, neck stiffness and postnasal drip. Negative for sore throat or swallowing problems. Negative for dental complaints.   Eyes: Negative for vision loss or change in visual acuity.  Respiratory: Negative for chest tightness and wheezing.   Cardiovascular: Negative for chest pain and palpitationNo decreased exercise tolerance Gastrointestinal: No change in bowel habit. No bloating or gas. No reflux or indigestion Genitourinary: Negative for urgency, frequency, flank pain and difficulty urinating.  Musculoskeletal: Negative for myalgias, back pain, arthralgias and gait problem.  Neurological: Negative for dizziness, tremors, weakness and headaches.  Hematological: Negative for adenopathy.  Psychiatric/Behavioral: Negative for behavioral problems and dysphoric mood.        Objective:   Physical Exam Vitals reviewed Gen'l - WNWD older white woman in no distress Pul - normal respirations Cor - RRR Derm - careful exam of the scalp revealed two small, 3mm, nodules that were firm, not particularly tender with no open lesions found. Several small subq nodules palpated on arm and thigh.       Assessment & Plan:  1. Subcutaneous nodules - benign appearing cystic lesions.  Plan - no intervention or further work-up indicated           Patient reassured.

## 2010-12-30 ENCOUNTER — Telehealth: Payer: Self-pay | Admitting: *Deleted

## 2010-12-30 ENCOUNTER — Other Ambulatory Visit: Payer: Self-pay | Admitting: *Deleted

## 2010-12-30 MED ORDER — ATENOLOL 50 MG PO TABS: 25.0000 mg | ORAL_TABLET | Freq: Every day | ORAL | Status: DC

## 2010-12-30 MED ORDER — LEVOTHYROXINE SODIUM 175 MCG PO TABS: 175.0000 ug | ORAL_TABLET | Freq: Every day | ORAL | Status: DC

## 2010-12-30 MED ORDER — ATENOLOL 50 MG PO TABS: 50.0000 mg | ORAL_TABLET | Freq: Every day | ORAL | Status: DC

## 2010-12-30 MED ORDER — TOPIRAMATE 200 MG PO TABS: 200.0000 mg | ORAL_TABLET | Freq: Two times a day (BID) | ORAL | Status: DC

## 2010-12-30 NOTE — Telephone Encounter (Signed)
Pharm called - looked into last RX for atenolol b/c pharm was questioning the directions we sent in. Updated med list and informed pharm. Pt takes 1/2 once daily #45 = 3 mth supply

## 2011-01-10 ENCOUNTER — Other Ambulatory Visit: Payer: Self-pay | Admitting: *Deleted

## 2011-01-10 MED ORDER — LEVOTHYROXINE SODIUM 175 MCG PO TABS: 175.0000 ug | ORAL_TABLET | Freq: Every day | ORAL | Status: DC

## 2011-01-10 MED ORDER — LOVASTATIN 40 MG PO TABS: 40.0000 mg | ORAL_TABLET | Freq: Every day | ORAL | Status: DC

## 2011-01-10 MED ORDER — ATENOLOL 50 MG PO TABS: 25.0000 mg | ORAL_TABLET | Freq: Every day | ORAL | Status: DC

## 2011-01-10 MED ORDER — TOPIRAMATE 200 MG PO TABS: 200.0000 mg | ORAL_TABLET | Freq: Two times a day (BID) | ORAL | Status: DC

## 2011-03-30 ENCOUNTER — Telehealth: Payer: Self-pay | Admitting: *Deleted

## 2011-03-30 MED ORDER — LORAZEPAM 0.5 MG PO TABS: 0.5000 mg | ORAL_TABLET | Freq: Three times a day (TID) | ORAL | Status: DC

## 2011-03-30 NOTE — Telephone Encounter (Signed)
k x 5 

## 2011-03-30 NOTE — Telephone Encounter (Signed)
Called in.

## 2011-03-30 NOTE — Telephone Encounter (Signed)
Patient requesting RF of ativan.

## 2011-04-04 LAB — HM PAP SMEAR: HM Pap smear: NORMAL

## 2011-04-13 DIAGNOSIS — H2512 Age-related nuclear cataract, left eye: Secondary | ICD-10-CM | POA: Insufficient documentation

## 2011-04-15 ENCOUNTER — Encounter: Payer: Self-pay | Admitting: Internal Medicine

## 2011-04-15 ENCOUNTER — Ambulatory Visit (INDEPENDENT_AMBULATORY_CARE_PROVIDER_SITE_OTHER): Payer: Medicare Other | Admitting: Internal Medicine

## 2011-04-15 VITALS — BP 130/70 | HR 69 | Temp 98.4°F | Wt 156.1 lb

## 2011-04-15 DIAGNOSIS — J029 Acute pharyngitis, unspecified: Secondary | ICD-10-CM

## 2011-04-15 DIAGNOSIS — R05 Cough: Secondary | ICD-10-CM

## 2011-04-15 MED ORDER — BENZONATATE 100 MG PO CAPS: 100.0000 mg | ORAL_CAPSULE | Freq: Three times a day (TID) | ORAL | Status: DC | PRN

## 2011-04-15 MED ORDER — HYDROCODONE-HOMATROPINE 5-1.5 MG/5ML PO SYRP: 5.0000 mL | ORAL_SOLUTION | Freq: Four times a day (QID) | ORAL | Status: AC | PRN

## 2011-04-15 NOTE — Patient Instructions (Signed)
It was good to see you today. If you develop worsening symptoms or fever, call and we can reconsider antibiotics, but it does not appear necessary to use antibiotics at this time. For her throat and cough symptoms, use cough syrup and Tessalon Perles daily for 5 days, then as needed

## 2011-04-15 NOTE — Progress Notes (Signed)
  Subjective:    Patient ID: [name], female    DOB: 01/11/1941, 70 y.o.   MRN: [id number]  HPI  complains of sore throat and dry cough Ongoing x 3 weeks Complicated by chronic sinus drainage - no recent change in this Pain with swallow and + burning sensation No fever or travel No headache or sinus pressure No chest pain or shortness of breath   Past Medical History  Diagnosis Date  . Benign neoplasm of colon   . Osteoarthritis   . Hiatal hernia   . Ischemia   . Diarrhea   . Blurred vision   . Back pain   . Gastroenteritis   . Chronic maxillary sinusitis   . Depression   . Eustachian tube dysfunction   . Tardive dyskinesia   . Dyspnea   . Mitral valve prolapse   . Osteoarthritis   . GERD (gastroesophageal reflux disease)   . Allergic rhinitis   . Glaucoma   . Hypothyroid   . Hypertension     Review of Systems  Respiratory: Negative for choking and wheezing.   Genitourinary: Negative for urgency and pelvic pain.  Neurological: Negative for seizures and light-headedness.       Objective:   Physical Exam BP 130/70  Pulse 69  Temp(Src) 98.4 F (36.9 C) (Oral)  Wt 156 lb 1.9 oz (70.816 kg)  SpO2 98% Wt Readings from Last 3 Encounters:  04/15/11 156 lb 1.9 oz (70.816 kg)  11/11/10 158 lb (71.668 kg)  09/16/10 160 lb (72.576 kg)   Constitutional: She appears well-developed and well-nourished. No distress.  HENT: Head: Normocephalic and atraumatic. Ears: B TMs ok, no erythema or effusion; Nose: Nose normal.  Mouth/Throat: Oropharynx is mildly erythematous but clear and moist. No oropharyngeal exudate.  Eyes: Conjunctivae and EOM are normal. Pupils are equal, round, and reactive to light. No scleral icterus.  Neck: Normal range of motion. Neck supple. No LAD or JVD present. No thyromegaly present.  Cardiovascular: Normal rate, regular rhythm and normal heart sounds.  No murmur heard. No BLE edema. Pulmonary/Chest: Effort normal and breath sounds normal. No  respiratory distress. She has no wheezes.       Assessment & Plan:   Pharyngitis, viral > persisting aggravation due to ongoing dry cough Afebrile and no evidence on exam or history for bacterial infection Hold empiric antibiotics Symptomatic care with narcotic syrup and Tessalon as needed - erx done Patient to call if symptoms worse or unimproved

## 2011-04-20 ENCOUNTER — Other Ambulatory Visit: Payer: Self-pay | Admitting: *Deleted

## 2011-04-20 MED ORDER — FLUOXETINE HCL 20 MG PO CAPS: 20.0000 mg | ORAL_CAPSULE | Freq: Every day | ORAL | Status: DC

## 2011-04-20 NOTE — Telephone Encounter (Signed)
Done

## 2011-04-20 NOTE — Telephone Encounter (Signed)
Ok for refill x 11 

## 2011-04-20 NOTE — Telephone Encounter (Signed)
Request for Fluoxetine [last refill done by Pulmonary on 09.07.2010].?

## 2011-06-06 DIAGNOSIS — Z23 Encounter for immunization: Secondary | ICD-10-CM | POA: Diagnosis not present

## 2011-06-21 DIAGNOSIS — N3941 Urge incontinence: Secondary | ICD-10-CM | POA: Diagnosis not present

## 2011-06-23 DIAGNOSIS — Z9849 Cataract extraction status, unspecified eye: Secondary | ICD-10-CM | POA: Diagnosis not present

## 2011-07-05 DIAGNOSIS — K921 Melena: Secondary | ICD-10-CM | POA: Diagnosis not present

## 2011-07-12 ENCOUNTER — Other Ambulatory Visit: Payer: Self-pay | Admitting: *Deleted

## 2011-07-12 DIAGNOSIS — N3941 Urge incontinence: Secondary | ICD-10-CM | POA: Diagnosis not present

## 2011-07-12 MED ORDER — ATENOLOL 50 MG PO TABS: 25.0000 mg | ORAL_TABLET | Freq: Every day | ORAL | Status: DC

## 2011-07-12 MED ORDER — LEVOTHYROXINE SODIUM 175 MCG PO TABS: 175.0000 ug | ORAL_TABLET | Freq: Every day | ORAL | Status: DC

## 2011-07-12 MED ORDER — LOVASTATIN 40 MG PO TABS: 40.0000 mg | ORAL_TABLET | Freq: Every day | ORAL | Status: DC

## 2011-07-12 MED ORDER — TOPIRAMATE 200 MG PO TABS: 200.0000 mg | ORAL_TABLET | Freq: Two times a day (BID) | ORAL | Status: DC

## 2011-07-12 NOTE — Telephone Encounter (Signed)
Done

## 2011-07-21 DIAGNOSIS — Z09 Encounter for follow-up examination after completed treatment for conditions other than malignant neoplasm: Secondary | ICD-10-CM | POA: Diagnosis not present

## 2011-07-21 DIAGNOSIS — H02409 Unspecified ptosis of unspecified eyelid: Secondary | ICD-10-CM | POA: Diagnosis not present

## 2011-07-21 DIAGNOSIS — H02839 Dermatochalasis of unspecified eye, unspecified eyelid: Secondary | ICD-10-CM | POA: Diagnosis not present

## 2011-07-21 DIAGNOSIS — H353190 Nonexudative age-related macular degeneration, unspecified eye, stage unspecified: Secondary | ICD-10-CM | POA: Diagnosis not present

## 2011-07-21 DIAGNOSIS — H40009 Preglaucoma, unspecified, unspecified eye: Secondary | ICD-10-CM | POA: Diagnosis not present

## 2011-07-21 DIAGNOSIS — H2510 Age-related nuclear cataract, unspecified eye: Secondary | ICD-10-CM | POA: Diagnosis not present

## 2011-07-21 DIAGNOSIS — H01009 Unspecified blepharitis unspecified eye, unspecified eyelid: Secondary | ICD-10-CM | POA: Diagnosis not present

## 2011-07-21 DIAGNOSIS — Z9849 Cataract extraction status, unspecified eye: Secondary | ICD-10-CM | POA: Diagnosis not present

## 2011-08-02 DIAGNOSIS — N3941 Urge incontinence: Secondary | ICD-10-CM | POA: Diagnosis not present

## 2011-08-23 DIAGNOSIS — N3941 Urge incontinence: Secondary | ICD-10-CM | POA: Diagnosis not present

## 2011-09-06 DIAGNOSIS — M542 Cervicalgia: Secondary | ICD-10-CM | POA: Diagnosis not present

## 2011-09-06 DIAGNOSIS — M545 Low back pain: Secondary | ICD-10-CM | POA: Diagnosis not present

## 2011-09-06 DIAGNOSIS — M546 Pain in thoracic spine: Secondary | ICD-10-CM | POA: Diagnosis not present

## 2011-09-06 DIAGNOSIS — M25569 Pain in unspecified knee: Secondary | ICD-10-CM | POA: Diagnosis not present

## 2011-09-19 ENCOUNTER — Other Ambulatory Visit: Payer: Self-pay | Admitting: Internal Medicine

## 2011-09-21 ENCOUNTER — Telehealth: Payer: Self-pay | Admitting: *Deleted

## 2011-09-21 ENCOUNTER — Other Ambulatory Visit: Payer: Self-pay | Admitting: *Deleted

## 2011-09-21 NOTE — Telephone Encounter (Signed)
Medication called to [facility] pharmacy for lorazepam 0.5 mg one tablet up to 3 x daily    Fannie Knee 04.24.2013  4 41pm

## 2011-09-21 NOTE — Telephone Encounter (Signed)
[facility] for [facility]ill of medication Lorazepam.  Last refill 03/30/2011 for 90 with 5 refills. Please advise

## 2011-09-21 NOTE — Telephone Encounter (Signed)
Patient notified of medication called to pharmacy / Sue . 4.24.13  4 44pm 

## 2011-09-21 NOTE — Telephone Encounter (Signed)
ok 

## 2011-09-21 NOTE — Telephone Encounter (Signed)
Patient notified of medication called to pharmacy / Fannie Knee . 4.24.13  4 44pm

## 2011-09-22 DIAGNOSIS — L821 Other seborrheic keratosis: Secondary | ICD-10-CM | POA: Diagnosis not present

## 2011-09-22 DIAGNOSIS — D2370 Other benign neoplasm of skin of unspecified lower limb, including hip: Secondary | ICD-10-CM | POA: Diagnosis not present

## 2011-09-22 DIAGNOSIS — L7211 Pilar cyst: Secondary | ICD-10-CM | POA: Diagnosis not present

## 2011-09-26 ENCOUNTER — Ambulatory Visit (INDEPENDENT_AMBULATORY_CARE_PROVIDER_SITE_OTHER): Payer: Medicare Other | Admitting: Internal Medicine

## 2011-09-26 ENCOUNTER — Encounter: Payer: Self-pay | Admitting: Internal Medicine

## 2011-09-26 ENCOUNTER — Other Ambulatory Visit: Payer: Self-pay | Admitting: *Deleted

## 2011-09-26 VITALS — BP 120/80 | HR 81 | Temp 97.9°F | Resp 16 | Wt 153.0 lb

## 2011-09-26 DIAGNOSIS — E039 Hypothyroidism, unspecified: Secondary | ICD-10-CM

## 2011-09-26 DIAGNOSIS — R55 Syncope and collapse: Secondary | ICD-10-CM | POA: Diagnosis not present

## 2011-09-26 DIAGNOSIS — R209 Unspecified disturbances of skin sensation: Secondary | ICD-10-CM | POA: Diagnosis not present

## 2011-09-26 DIAGNOSIS — R202 Paresthesia of skin: Secondary | ICD-10-CM

## 2011-09-26 MED ORDER — MECLIZINE HCL 25 MG PO TABS: 25.0000 mg | ORAL_TABLET | Freq: Four times a day (QID) | ORAL | Status: DC | PRN

## 2011-09-26 MED ORDER — PROMETHAZINE HCL 25 MG PO TABS: 25.0000 mg | ORAL_TABLET | Freq: Four times a day (QID) | ORAL | Status: DC | PRN

## 2011-09-26 NOTE — Patient Instructions (Signed)
Probable feinting spell - normal neurologic exam, no evidence of any stroke like event or findings. Most concerned about irregular heart rhythm although exam is normal.  Plan: Continue all you present medications Carotid Doppler to look for blockages 2 D echo to look at heart and heart function 48 hour holter monitor - to wear a box to record heart rhythms for two days.  No need or indication for any brain imaging.   Syncope You have had a fainting (syncopal) spell. A fainting episode is a sudden, short-lived loss of consciousness. It results in complete recovery. It occurs because there has been a temporary shortage of oxygen and/or sugar (glucose) to the brain. CAUSES    Blood pressure pills and other medications that may lower blood pressure below normal. Sudden changes in posture (sudden standing).   Over-medication. Take your medications as directed.   Standing too long. This can cause blood to pool in the legs.   Seizure disorders.   Low blood sugar (hypoglycemia) of diabetes. This more commonly causes coma.   Bearing down to go to the bathroom. This can cause your blood pressure to rise suddenly. Your body compensates by making the blood pressure too low when you stop bearing down.   Hardening of the arteries where the brain temporarily does not receive enough blood.   Irregular heart beat and circulatory problems.   Fear, emotional distress, injury, sight of blood, or illness.  Your caregiver will send you home if the syncope was from non-worrisome causes (benign). Depending on your age and health, you may stay to be monitored and observed. If you return home, have someone stay with you if your caregiver feels that is desirable. It is very important to keep all follow-up referrals and appointments in order to properly manage this condition. This is a serious problem which can lead to serious illness and death if not carefully managed.   WARNING: Do not drive or operate  machinery until your caregiver feels that it is safe for you to do so. SEEK IMMEDIATE MEDICAL CARE IF:    You have another fainting episode or faint while lying or sitting down. DO NOT DRIVE YOURSELF. Call 911 if no other help is available.   You have chest pain, are feeling sick to your stomach (nausea), vomiting or abdominal pain.   You have an irregular heartbeat or one that is very fast (pulse over 120 beats per minute).   You have a loss of feeling in some part of your body or lose movement in your arms or legs.   You have difficulty with speech, confusion, severe weakness, or visual problems.   You become sweaty and/or feel light headed.  Make sure you are rechecked as instructed. Document Released: 05/16/2005 Document Revised: 05/05/2011 Document Reviewed: 01/04/2007 [facility] Patient Information 2012 [place], [place].

## 2011-09-26 NOTE — Progress Notes (Signed)
  Subjective:    Patient ID: [name], female    DOB: [birth year], 71 y.o.   MRN: [id number]  HPI [name] presents after an episode of "syncope:" she had been standing at the sink getting ready to shower and then she found herself in the bathtub after having fallen backward. She struck her occiput, shoulders/scapula and low back. She came to consciousness as soon as she hit the tub. She had no prodrome prior to her fall. She had no post-fall confusion. She has been having a little chest pain but had no chest pain at the time she fell. She has not noted any abnormal heart beats. She did see Dr. Ophelia Charter: x-rays of neck and spine were normal - she had healed from surgery. She has since been doing OK except for mild nausea. She has no prior h/o syncope or CNS related illness.  Past Medical History  Diagnosis Date  . Benign neoplasm of colon   . Osteoarthritis   . Hiatal hernia   . Ischemia   . Diarrhea   . Blurred vision   . Back pain   . Gastroenteritis   . Chronic maxillary sinusitis   . Depression   . Eustachian tube dysfunction   . Tardive dyskinesia   . Dyspnea   . Mitral valve prolapse   . Osteoarthritis   . GERD (gastroesophageal reflux disease)   . Allergic rhinitis   . Glaucoma   . Hypothyroid   . Hypertension    Past Surgical History  Procedure Date  . Knee surgery     x 4  . 1 baker cyst removed   . Dilation and curettage of uterus     x3  . Lumbar spine surgery    Family History  Problem Relation Age of Onset  . Cancer Mother     Breast Cancer with Metastatic disease  . Alzheimer's disease Father    History   Social History  . Marital Status: Widowed    Spouse Name: N/A    Number of Children: N/A  . Years of Education: N/A   Occupational History  . Not on file.   Social History Main Topics  . Smoking status: Never Smoker   . Smokeless tobacco: Not on file  . Alcohol Use: Not on file  . Drug Use: Not on file  . Sexually Active: Not on file    Other Topics Concern  . 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     Objective:   Physical Exam Filed Vitals:   09/26/11 1135  BP: 120/80  Pulse: 81  Temp: 97.9 F (36.6 C)  Resp: 16   Gen'l- WNWD overweight white woman HEENT - C&S clear, PERRLA, Fundi - normal disc margins, no abnormality Neck - Supple Nodes - negative cervical area Cor - 2+ radial pulse, no JVD, no carotid bruit Neuro - CN II-XII- normal facial symmetry, PERRLA, EOMI, no tongue fasiculations, normal shoulder shrug; MS 5/5 and equal; DTR's 1+ and symmetrical; cerebellar - normal gait, tandem gait, finger to nose, rapid finger movement, heel to shin; no tremor, no cogwheeling.       Assessment & Plan:

## 2011-09-27 DIAGNOSIS — N3941 Urge incontinence: Secondary | ICD-10-CM | POA: Diagnosis not present

## 2011-09-27 NOTE — Assessment & Plan Note (Signed)
Sudden fall w/o prodroma raising concern for cardiac syncope. Her neurologic exam is normal - no indication for neuro imaging.  Plan - carotid doppler           2 D echo           48 hours holter.

## 2011-09-29 ENCOUNTER — Other Ambulatory Visit: Payer: Self-pay | Admitting: Cardiology

## 2011-09-29 DIAGNOSIS — R55 Syncope and collapse: Secondary | ICD-10-CM

## 2011-10-04 ENCOUNTER — Encounter (INDEPENDENT_AMBULATORY_CARE_PROVIDER_SITE_OTHER): Payer: Medicare Other

## 2011-10-04 DIAGNOSIS — R55 Syncope and collapse: Secondary | ICD-10-CM | POA: Diagnosis not present

## 2011-10-06 ENCOUNTER — Encounter (INDEPENDENT_AMBULATORY_CARE_PROVIDER_SITE_OTHER): Payer: Medicare Other

## 2011-10-06 DIAGNOSIS — R55 Syncope and collapse: Secondary | ICD-10-CM | POA: Diagnosis not present

## 2011-10-11 ENCOUNTER — Ambulatory Visit (HOSPITAL_COMMUNITY): Payer: Medicare Other | Attending: Cardiology

## 2011-10-11 ENCOUNTER — Other Ambulatory Visit: Payer: Self-pay

## 2011-10-11 DIAGNOSIS — R55 Syncope and collapse: Secondary | ICD-10-CM | POA: Diagnosis not present

## 2011-10-11 DIAGNOSIS — E669 Obesity, unspecified: Secondary | ICD-10-CM | POA: Insufficient documentation

## 2011-10-11 DIAGNOSIS — R209 Unspecified disturbances of skin sensation: Secondary | ICD-10-CM | POA: Insufficient documentation

## 2011-10-11 DIAGNOSIS — I059 Rheumatic mitral valve disease, unspecified: Secondary | ICD-10-CM | POA: Diagnosis not present

## 2011-10-13 ENCOUNTER — Encounter: Payer: Self-pay | Admitting: Internal Medicine

## 2011-10-13 NOTE — Progress Notes (Unsigned)
Echocardiogram performed.  

## 2011-10-25 DIAGNOSIS — N3941 Urge incontinence: Secondary | ICD-10-CM | POA: Diagnosis not present

## 2011-10-27 ENCOUNTER — Encounter: Payer: Self-pay | Admitting: Internal Medicine

## 2011-11-14 DIAGNOSIS — N3941 Urge incontinence: Secondary | ICD-10-CM | POA: Diagnosis not present

## 2011-11-15 DIAGNOSIS — H02409 Unspecified ptosis of unspecified eyelid: Secondary | ICD-10-CM | POA: Diagnosis not present

## 2011-11-15 DIAGNOSIS — H0230 Blepharochalasis unspecified eye, unspecified eyelid: Secondary | ICD-10-CM | POA: Diagnosis not present

## 2011-11-15 DIAGNOSIS — H40009 Preglaucoma, unspecified, unspecified eye: Secondary | ICD-10-CM | POA: Diagnosis not present

## 2011-11-15 DIAGNOSIS — H02839 Dermatochalasis of unspecified eye, unspecified eyelid: Secondary | ICD-10-CM | POA: Diagnosis not present

## 2011-11-15 DIAGNOSIS — Z961 Presence of intraocular lens: Secondary | ICD-10-CM | POA: Diagnosis not present

## 2011-11-15 DIAGNOSIS — H3530 Unspecified macular degeneration: Secondary | ICD-10-CM | POA: Diagnosis not present

## 2011-11-17 ENCOUNTER — Encounter: Payer: Self-pay | Admitting: Internal Medicine

## 2011-11-17 ENCOUNTER — Other Ambulatory Visit: Payer: Self-pay

## 2011-11-17 ENCOUNTER — Other Ambulatory Visit: Payer: Self-pay | Admitting: *Deleted

## 2011-11-17 ENCOUNTER — Ambulatory Visit (INDEPENDENT_AMBULATORY_CARE_PROVIDER_SITE_OTHER): Payer: Medicare Other | Admitting: Internal Medicine

## 2011-11-17 VITALS — BP 122/68 | HR 80 | Temp 98.7°F | Resp 16 | Ht 62.0 in | Wt 156.0 lb

## 2011-11-17 DIAGNOSIS — R55 Syncope and collapse: Secondary | ICD-10-CM

## 2011-11-17 DIAGNOSIS — M549 Dorsalgia, unspecified: Secondary | ICD-10-CM | POA: Diagnosis not present

## 2011-11-17 MED ORDER — FLUOXETINE HCL 20 MG PO CAPS: 20.0000 mg | ORAL_CAPSULE | Freq: Every day | ORAL | Status: DC

## 2011-11-17 NOTE — Progress Notes (Signed)
  Subjective:    Patient ID: [name], female    DOB: May 29, 1941, 71 y.o.   MRN: [id number]  HPI [name] presents for follow-up. She was evaluated for syncope: normal 2D echo, normal Carotid doppler, normal holter monitor. No revelation of etiology for syncope. She has not had any recurrence.  Reviewed med list in detail  She c/o back pain that radiates to the leg, all the way down to the toes. She does have burning pain in her legs. She was on gabapentin 100mg  qid but stopped for lack of efficacy. She did have multi - level decompression and fusion of Lumbar spine Feb '11  Past Medical History  Diagnosis Date  . Benign neoplasm of colon   . Osteoarthritis   . Hiatal hernia   . Ischemia   . Diarrhea   . Blurred vision   . Back pain   . Gastroenteritis   . Chronic maxillary sinusitis   . Depression   . Eustachian tube dysfunction   . Tardive dyskinesia   . Dyspnea   . Mitral valve prolapse   . Osteoarthritis   . GERD (gastroesophageal reflux disease)   . Allergic rhinitis   . Glaucoma   . Hypothyroid   . Hypertension    Past Surgical History  Procedure Date  . Knee surgery     x 4  . 1 baker cyst removed   . Dilation and curettage of uterus     x3  . Lumbar spine surgery    Family History  Problem Relation Age of Onset  . Cancer Mother     Breast Cancer with Metastatic disease  . Alzheimer's disease Father    History   Social History  . Marital Status: Widowed    Spouse Name: N/A    Number of Children: N/A  . Years of Education: N/A   Occupational History  . Not on file.   Social History Main Topics  . Smoking status: Never Smoker   . Smokeless tobacco: Not on file  . Alcohol Use: Not on file  . Drug Use: Not on file  . Sexually Active: Not on file   Other Topics Concern  . 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     Objective:   Physical Exam Filed Vitals:   11/17/11 1114  BP: 122/68  Pulse: 80  Temp: 98.7 F (37.1 C)  Resp: 16   Gen'l - WNWD white woman in no distress HEENT- C&S clear Cor- RRR, 2+ radial pulse, no JVD Pulm - normal respirations Back exam: normal stand; normal flex to greater than 100 degrees; normal gait; normal toe/heel walk; normal step up to exam table; normal SLR sitting; normal DTRs at the patellar tendons- right, no elicited relfex left patellar tendon; decrease/absent sensation to light touch, pin-prick and deep vibratory stimulus both feet; no  CVA tenderness; able to move supine to sitting witout assistance.       Assessment & Plan:

## 2011-11-17 NOTE — Telephone Encounter (Signed)
Rx for fluoxetine for 90 day supply to [facility]

## 2011-11-17 NOTE — Patient Instructions (Addendum)
Syncope (feinting) - the heart tests and test of carotid artery are all normal. No explanation for this event you had. No further testing needed at this time. If you have any recurrent episodes you will see a heart specialist for possible implantable recorder.  Back pain - on exam there is no evidence of a pinched nerve that is serious. For the pain - regular back stretching exericse, ok to take both tylenol 1000 mg three times a day as maximum dose and aleve AM and PM. For continued problems see Dr. Ophelia Charter.  Blood pressure is good.   Medication list reviewed and what is below should be correct.

## 2011-11-19 NOTE — Assessment & Plan Note (Addendum)
Back pain with a non-radicular exam.   Plan Resume gabapentin for control of pain.  For continued pain she should see her back surgeon.

## 2011-11-19 NOTE — Assessment & Plan Note (Signed)
No recurrent events

## 2011-11-25 ENCOUNTER — Telehealth: Payer: Self-pay | Admitting: *Deleted

## 2011-11-25 NOTE — Telephone Encounter (Signed)
Notified patient of Rx at [facility] pharmacy, lorazepam.  Patient forgot to ask at last visit if she could ride lawnmower and cut her own grass. Please advise

## 2011-11-25 NOTE — Telephone Encounter (Signed)
Patient request refill on lorazepam

## 2011-11-25 NOTE — Telephone Encounter (Signed)
Ok for refill x 5 

## 2011-11-26 NOTE — Telephone Encounter (Signed)
As long as it doesn't cause her more pain.

## 2011-11-28 ENCOUNTER — Telehealth: Payer: Self-pay | Admitting: *Deleted

## 2011-11-28 NOTE — Telephone Encounter (Signed)
Left message on cell # of response to riding lawnmower

## 2011-12-07 DIAGNOSIS — N3941 Urge incontinence: Secondary | ICD-10-CM | POA: Diagnosis not present

## 2011-12-27 DIAGNOSIS — N3941 Urge incontinence: Secondary | ICD-10-CM | POA: Diagnosis not present

## 2012-01-17 DIAGNOSIS — H02839 Dermatochalasis of unspecified eye, unspecified eyelid: Secondary | ICD-10-CM | POA: Diagnosis not present

## 2012-01-17 DIAGNOSIS — H353190 Nonexudative age-related macular degeneration, unspecified eye, stage unspecified: Secondary | ICD-10-CM | POA: Diagnosis not present

## 2012-01-17 DIAGNOSIS — H01009 Unspecified blepharitis unspecified eye, unspecified eyelid: Secondary | ICD-10-CM | POA: Diagnosis not present

## 2012-01-17 DIAGNOSIS — H2510 Age-related nuclear cataract, unspecified eye: Secondary | ICD-10-CM | POA: Diagnosis not present

## 2012-01-17 DIAGNOSIS — Z9849 Cataract extraction status, unspecified eye: Secondary | ICD-10-CM | POA: Diagnosis not present

## 2012-01-17 DIAGNOSIS — H02409 Unspecified ptosis of unspecified eyelid: Secondary | ICD-10-CM | POA: Diagnosis not present

## 2012-01-17 DIAGNOSIS — H40009 Preglaucoma, unspecified, unspecified eye: Secondary | ICD-10-CM | POA: Diagnosis not present

## 2012-01-24 ENCOUNTER — Other Ambulatory Visit: Payer: Self-pay | Admitting: *Deleted

## 2012-01-24 MED ORDER — ATENOLOL 50 MG PO TABS: 25.0000 mg | ORAL_TABLET | Freq: Every day | ORAL | Status: DC

## 2012-01-25 DIAGNOSIS — N3941 Urge incontinence: Secondary | ICD-10-CM | POA: Diagnosis not present

## 2012-02-14 DIAGNOSIS — N3941 Urge incontinence: Secondary | ICD-10-CM | POA: Diagnosis not present

## 2012-02-28 DIAGNOSIS — I10 Essential (primary) hypertension: Secondary | ICD-10-CM | POA: Diagnosis not present

## 2012-03-06 DIAGNOSIS — N3941 Urge incontinence: Secondary | ICD-10-CM | POA: Diagnosis not present

## 2012-03-20 DIAGNOSIS — M546 Pain in thoracic spine: Secondary | ICD-10-CM | POA: Diagnosis not present

## 2012-03-20 DIAGNOSIS — M545 Low back pain: Secondary | ICD-10-CM | POA: Diagnosis not present

## 2012-03-28 DIAGNOSIS — Z888 Allergy status to other drugs, medicaments and biological substances status: Secondary | ICD-10-CM | POA: Diagnosis not present

## 2012-03-28 DIAGNOSIS — Z961 Presence of intraocular lens: Secondary | ICD-10-CM | POA: Diagnosis not present

## 2012-03-28 DIAGNOSIS — Z9889 Other specified postprocedural states: Secondary | ICD-10-CM | POA: Diagnosis not present

## 2012-03-28 DIAGNOSIS — Z9849 Cataract extraction status, unspecified eye: Secondary | ICD-10-CM | POA: Diagnosis not present

## 2012-03-28 DIAGNOSIS — E785 Hyperlipidemia, unspecified: Secondary | ICD-10-CM | POA: Diagnosis not present

## 2012-03-28 DIAGNOSIS — Z881 Allergy status to other antibiotic agents status: Secondary | ICD-10-CM | POA: Diagnosis not present

## 2012-03-28 DIAGNOSIS — H40009 Preglaucoma, unspecified, unspecified eye: Secondary | ICD-10-CM | POA: Diagnosis not present

## 2012-03-28 DIAGNOSIS — Z981 Arthrodesis status: Secondary | ICD-10-CM | POA: Diagnosis not present

## 2012-03-28 DIAGNOSIS — Z8673 Personal history of transient ischemic attack (TIA), and cerebral infarction without residual deficits: Secondary | ICD-10-CM | POA: Diagnosis not present

## 2012-03-28 DIAGNOSIS — Z882 Allergy status to sulfonamides status: Secondary | ICD-10-CM | POA: Diagnosis not present

## 2012-03-28 DIAGNOSIS — Z96659 Presence of unspecified artificial knee joint: Secondary | ICD-10-CM | POA: Diagnosis not present

## 2012-03-28 DIAGNOSIS — I10 Essential (primary) hypertension: Secondary | ICD-10-CM | POA: Diagnosis not present

## 2012-03-28 DIAGNOSIS — H01009 Unspecified blepharitis unspecified eye, unspecified eyelid: Secondary | ICD-10-CM | POA: Diagnosis not present

## 2012-03-28 DIAGNOSIS — Z8719 Personal history of other diseases of the digestive system: Secondary | ICD-10-CM | POA: Diagnosis not present

## 2012-03-28 DIAGNOSIS — H0230 Blepharochalasis unspecified eye, unspecified eyelid: Secondary | ICD-10-CM | POA: Diagnosis not present

## 2012-03-28 DIAGNOSIS — E039 Hypothyroidism, unspecified: Secondary | ICD-10-CM | POA: Diagnosis not present

## 2012-03-28 DIAGNOSIS — H3530 Unspecified macular degeneration: Secondary | ICD-10-CM | POA: Diagnosis not present

## 2012-03-28 DIAGNOSIS — M129 Arthropathy, unspecified: Secondary | ICD-10-CM | POA: Diagnosis not present

## 2012-03-28 DIAGNOSIS — Z90710 Acquired absence of both cervix and uterus: Secondary | ICD-10-CM | POA: Diagnosis not present

## 2012-03-28 DIAGNOSIS — H02839 Dermatochalasis of unspecified eye, unspecified eyelid: Secondary | ICD-10-CM | POA: Diagnosis not present

## 2012-03-28 DIAGNOSIS — Z7982 Long term (current) use of aspirin: Secondary | ICD-10-CM | POA: Diagnosis not present

## 2012-03-28 DIAGNOSIS — H02409 Unspecified ptosis of unspecified eyelid: Secondary | ICD-10-CM | POA: Diagnosis not present

## 2012-03-28 DIAGNOSIS — G2401 Drug induced subacute dyskinesia: Secondary | ICD-10-CM | POA: Diagnosis not present

## 2012-04-03 DIAGNOSIS — H0230 Blepharochalasis unspecified eye, unspecified eyelid: Secondary | ICD-10-CM | POA: Diagnosis not present

## 2012-04-03 DIAGNOSIS — H02409 Unspecified ptosis of unspecified eyelid: Secondary | ICD-10-CM | POA: Diagnosis not present

## 2012-04-03 DIAGNOSIS — Z9889 Other specified postprocedural states: Secondary | ICD-10-CM | POA: Diagnosis not present

## 2012-04-07 ENCOUNTER — Emergency Department (HOSPITAL_COMMUNITY)
Admission: EM | Admit: 2012-04-07 | Discharge: 2012-04-08 | Disposition: A | Discharge status: Home / Self Care | Payer: Medicare Other | Attending: Emergency Medicine | Admitting: Emergency Medicine

## 2012-04-07 ENCOUNTER — Encounter (HOSPITAL_COMMUNITY): Payer: Self-pay | Admitting: Vascular Surgery

## 2012-04-07 DIAGNOSIS — R51 Headache: Secondary | ICD-10-CM | POA: Diagnosis not present

## 2012-04-07 DIAGNOSIS — K219 Gastro-esophageal reflux disease without esophagitis: Secondary | ICD-10-CM | POA: Insufficient documentation

## 2012-04-07 DIAGNOSIS — S01309A Unspecified open wound of unspecified ear, initial encounter: Secondary | ICD-10-CM | POA: Insufficient documentation

## 2012-04-07 DIAGNOSIS — M1990 Unspecified osteoarthritis, unspecified site: Secondary | ICD-10-CM | POA: Insufficient documentation

## 2012-04-07 DIAGNOSIS — F329 Major depressive disorder, single episode, unspecified: Secondary | ICD-10-CM | POA: Diagnosis not present

## 2012-04-07 DIAGNOSIS — I999 Unspecified disorder of circulatory system: Secondary | ICD-10-CM | POA: Diagnosis not present

## 2012-04-07 DIAGNOSIS — I059 Rheumatic mitral valve disease, unspecified: Secondary | ICD-10-CM | POA: Insufficient documentation

## 2012-04-07 DIAGNOSIS — J309 Allergic rhinitis, unspecified: Secondary | ICD-10-CM | POA: Diagnosis not present

## 2012-04-07 DIAGNOSIS — I10 Essential (primary) hypertension: Secondary | ICD-10-CM | POA: Insufficient documentation

## 2012-04-07 DIAGNOSIS — T148XXA Other injury of unspecified body region, initial encounter: Secondary | ICD-10-CM | POA: Diagnosis not present

## 2012-04-07 DIAGNOSIS — Y92009 Unspecified place in unspecified non-institutional (private) residence as the place of occurrence of the external cause: Secondary | ICD-10-CM | POA: Insufficient documentation

## 2012-04-07 DIAGNOSIS — Y9389 Activity, other specified: Secondary | ICD-10-CM | POA: Insufficient documentation

## 2012-04-07 DIAGNOSIS — Z7982 Long term (current) use of aspirin: Secondary | ICD-10-CM | POA: Diagnosis not present

## 2012-04-07 DIAGNOSIS — Z8669 Personal history of other diseases of the nervous system and sense organs: Secondary | ICD-10-CM | POA: Insufficient documentation

## 2012-04-07 DIAGNOSIS — S0990XA Unspecified injury of head, initial encounter: Secondary | ICD-10-CM | POA: Insufficient documentation

## 2012-04-07 DIAGNOSIS — Z87898 Personal history of other specified conditions: Secondary | ICD-10-CM | POA: Insufficient documentation

## 2012-04-07 DIAGNOSIS — E039 Hypothyroidism, unspecified: Secondary | ICD-10-CM | POA: Diagnosis not present

## 2012-04-07 DIAGNOSIS — S00439A Contusion of unspecified ear, initial encounter: Secondary | ICD-10-CM | POA: Diagnosis not present

## 2012-04-07 DIAGNOSIS — F3289 Other specified depressive episodes: Secondary | ICD-10-CM | POA: Insufficient documentation

## 2012-04-07 DIAGNOSIS — R197 Diarrhea, unspecified: Secondary | ICD-10-CM | POA: Diagnosis not present

## 2012-04-07 DIAGNOSIS — S01319A Laceration without foreign body of unspecified ear, initial encounter: Secondary | ICD-10-CM

## 2012-04-07 DIAGNOSIS — W1809XA Striking against other object with subsequent fall, initial encounter: Secondary | ICD-10-CM | POA: Insufficient documentation

## 2012-04-07 DIAGNOSIS — Z79899 Other long term (current) drug therapy: Secondary | ICD-10-CM | POA: Insufficient documentation

## 2012-04-07 DIAGNOSIS — W19XXXA Unspecified fall, initial encounter: Secondary | ICD-10-CM

## 2012-04-07 DIAGNOSIS — Z8719 Personal history of other diseases of the digestive system: Secondary | ICD-10-CM | POA: Diagnosis not present

## 2012-04-07 DIAGNOSIS — S0003XA Contusion of scalp, initial encounter: Secondary | ICD-10-CM | POA: Diagnosis not present

## 2012-04-07 DIAGNOSIS — S0993XA Unspecified injury of face, initial encounter: Secondary | ICD-10-CM | POA: Diagnosis not present

## 2012-04-07 NOTE — ED Provider Notes (Signed)
History     CSN: [id number]  Arrival date & time 04/07/12  2327   First MD Initiated Contact with Patient 04/07/12 2327      Chief Complaint  Patient presents with  . Fall    (Consider location/radiation/quality/duration/timing/severity/associated sxs/prior treatment) HPI Comments: [name] presents via EMS for evaluation after a fall at home.  She had just completed washing her hair and was sitting on the edge bed with her hands shielding her eyes as her hair was still dripping water.  She last her balance and slid over off the edge of the bed striking the left side of her head against a dresser.  She denies any LOC but states she has some ear pain, a localized, headache, and some neck pain.  She reports she has had issues with her balance and several previous falls.  She denies any other injuries.  She recently had an eye lift procedure and has surgical incisions on her forehead and upper eye lids.  Patient is a 71 y.o. female presenting with fall. The history is provided by the patient. No language interpreter was used.  Fall The accident occurred less than 1 hour ago. The fall occurred from a bed (fell off the edge of the bed). She landed on carpet. The volume of blood lost was minimal. The point of impact was the head. The pain is present in the head and neck. The pain is mild. She was ambulatory at the scene. There was no entrapment after the fall. There was no drug use involved in the accident. There was no alcohol use involved in the accident. Pertinent negatives include no visual change, no fever, no numbness, no abdominal pain, no bowel incontinence, no nausea, no vomiting, no headaches, no hearing loss, no loss of consciousness and no tingling. Treatment on scene includes a backboard.    Past Medical History  Diagnosis Date  . Benign neoplasm of colon   . Osteoarthritis   . Hiatal hernia   . Ischemia   . Diarrhea   . Blurred vision   . Back pain   . Gastroenteritis   .  Chronic maxillary sinusitis   . Depression   . Eustachian tube dysfunction   . Tardive dyskinesia   . Dyspnea   . Mitral valve prolapse   . Osteoarthritis   . GERD (gastroesophageal reflux disease)   . Allergic rhinitis   . Glaucoma(365)   . Hypothyroid   . Hypertension     Past Surgical History  Procedure Date  . Knee surgery     x 4  . 1 baker cyst removed   . Dilation and curettage of uterus     x3  . Lumbar spine surgery   . Abdominal hysterectomy   . Eye surgery     Family History  Problem Relation Age of Onset  . Cancer Mother     Breast Cancer with Metastatic disease  . Alzheimer's disease Father     History  Substance Use Topics  . Smoking status: Never Smoker   . Smokeless tobacco: Not on file  . Alcohol Use: No    OB History    Grav Para Term Preterm Abortions TAB SAB Ect Mult Living                  Review of Systems  Constitutional: Negative for fever.  Gastrointestinal: Negative for nausea, vomiting, abdominal pain and bowel incontinence.  Musculoskeletal: Positive for back pain (chronic) and arthralgias (chronic).  Neurological: Negative  for tingling, loss of consciousness, numbness and headaches.  All other systems reviewed and are negative.    Allergies  Benzocaine-resorcinol; Celecoxib; Glucosamine; Metoclopramide hcl; Niacin; Phenazopyridine hcl; Sulfonamide derivatives; and Trovan  Home Medications   Current Outpatient Rx  Name  Route  Sig  Dispense  Refill  . AMOXICILLIN 500 MG PO CAPS   Oral   Take 500 mg by mouth as needed. Prior to dental work          . AMOXICILLIN 875 MG PO TABS   Oral   Take 1 tablet (875 mg total) by mouth 2 (two) times daily.   20 tablet   0   . ASPIRIN 81 MG PO TABS   Oral   Take 81 mg by mouth daily.           . ATENOLOL 50 MG PO TABS   Oral   Take 0.5 tablets (25 mg total) by mouth daily.   90 tablet   3   . CALCIUM CARBONATE-VITAMIN D 600-200 MG-UNIT PO TABS   Oral   Take 2 tablets  by mouth daily.           Marland Kitchen VITAMIN D 1000 UNITS PO TABS   Oral   Take 1,000 Units by mouth daily.           Marland Kitchen DICLOFENAC SODIUM 1 % TD GEL   Topical   Apply topically.           Marland Kitchen FLUOXETINE HCL 20 MG PO CAPS   Oral   Take 1 capsule (20 mg total) by mouth daily.   90 capsule   3   . HYDROXYZINE HCL 25 MG PO TABS   Oral   Take 0.5 tablets (12.5 mg total) by mouth at bedtime.   30 tablet   0   . LEVOTHYROXINE SODIUM 175 MCG PO TABS   Oral   Take 1 tablet (175 mcg total) by mouth daily.   90 tablet   1   . LORATADINE 10 MG PO TABS   Oral   Take 10 mg by mouth daily.           Marland Kitchen LORAZEPAM 0.5 MG PO TABS   Oral   Take 1 tablet (0.5 mg total) by mouth 3 (three) times daily.   90 tablet   5   . LOVASTATIN 40 MG PO TABS   Oral   Take 1 tablet (40 mg total) by mouth at bedtime.   90 tablet   1   . MECLIZINE HCL 25 MG PO TABS   Oral   Take 1 tablet (25 mg total) by mouth every 6 (six) hours as needed for dizziness or nausea.   100 tablet   5   . ICAPS PLUS PO   Oral   Take by mouth.           . OMEPRAZOLE 20 MG PO CPDR   Oral   Take 20 mg by mouth daily.           Marland Kitchen PROMETHAZINE HCL 25 MG PO TABS   Oral   Take 1 tablet (25 mg total) by mouth every 6 (six) hours as needed.   30 tablet   5   . TOPIRAMATE 200 MG PO TABS   Oral   Take 1 tablet (200 mg total) by mouth 2 (two) times daily.   180 tablet   1   . VITAMIN E 400 UNITS PO CAPS   Oral  Take 400 Units by mouth daily.             SpO2 96%  Physical Exam  Nursing note and vitals reviewed. Constitutional: She is oriented to person, place, and time. She appears well-developed and well-nourished. No distress.  HENT:  Head: Normocephalic. Head is with contusion and with laceration. Head is without raccoon's eyes, without Battle's sign, without right periorbital erythema and without left periorbital erythema. Hair is normal.    Right Ear: Hearing, external ear and ear canal  normal. No lacerations. No tenderness. No mastoid tenderness. No hemotympanum. No decreased hearing is noted.  Left Ear: Hearing normal. Left ear exhibits lacerations. There is tenderness. No swelling. No mastoid tenderness. No decreased hearing is noted.  Nose: Nose normal. No rhinorrhea or sinus tenderness. No epistaxis. Right sinus exhibits no maxillary sinus tenderness and no frontal sinus tenderness. Left sinus exhibits no maxillary sinus tenderness and no frontal sinus tenderness.  Mouth/Throat: Oropharynx is clear and moist and mucous membranes are normal. Mucous membranes are not pale, not dry and not cyanotic. No oropharyngeal exudate.       Note small laceration of the helix of the left ear.  There is also a significant hematoma of overlying the antihelix.  On the posterior surface of the ear there is a contusion under a stellate laceration/puncture.  There is a small defect or crush injury to the underlying cartilage.  Eyes: Conjunctivae normal are normal. Pupils are equal, round, and reactive to light. Right eye exhibits no discharge. Left eye exhibits no discharge. No scleral icterus.  Neck: Trachea normal, normal range of motion and phonation normal. Neck supple. No JVD present. Muscular tenderness present. No tracheal tenderness and no spinous process tenderness present. No rigidity. No tracheal deviation, no edema, no erythema and normal range of motion present.  Cardiovascular: Normal rate, regular rhythm, intact distal pulses and normal pulses.   No extrasystoles are present. PMI is not displaced.  Exam reveals no gallop and no friction rub.   Murmur heard. Pulmonary/Chest: Effort normal and breath sounds normal. No stridor. No respiratory distress. She has no wheezes. She has no rales. She exhibits no tenderness.  Abdominal: Soft. Bowel sounds are normal. She exhibits no distension and no mass. There is no tenderness. There is no rebound and no guarding.  Musculoskeletal: Normal range  of motion. She exhibits no edema and no tenderness.  Lymphadenopathy:    She has no cervical adenopathy.  Neurological: She is alert and oriented to person, place, and time.  Skin: Skin is warm and dry. No rash noted. She is not diaphoretic. No erythema. No pallor.  Psychiatric: She has a normal mood and affect. Her behavior is normal.    ED Course  LACERATION REPAIR Date/Time: 04/08/2012 2:21 AM Performed by: Dana Allan T Authorized by: Dana Allan T Consent: Verbal consent obtained. Risks and benefits: risks, benefits and alternatives were discussed Consent given by: patient Site marked: the operative site was marked Imaging studies: imaging studies available Patient identity confirmed: verbally with patient Body area: head/neck Location details: left ear Laceration length: 3 cm Foreign bodies: no foreign bodies Vascular damage: no Anesthesia: nerve block (performed an ear block ) Local anesthetic: lidocaine 1% without epinephrine Anesthetic total: 5 ml Patient sedated: no Preparation: Patient was prepped and draped in the usual sterile fashion. Irrigation solution: saline Irrigation method: syringe Amount of cleaning: standard Debridement: none Skin closure: 5-0 Prolene (7-0 ethilon) Number of sutures: 7 Technique: simple Approximation: close (the laceration to  the posterior ear was closed loosely) Approximation difficulty: complex Dressing: pressure dressing and gauze roll Patient tolerance: Patient tolerated the procedure well with no immediate complications. Comments: See the I&D note for a full explanation of the procedure.  INCISION AND DRAINAGE Date/Time: 04/08/2012 2:25 AM Performed by: Dana Allan T Authorized by: Dana Allan T Consent: Verbal consent obtained. Risks and benefits: risks, benefits and alternatives were discussed Consent given by: patient Patient understanding: patient states understanding of the procedure being performed Site  marked: the operative site was marked Patient identity confirmed: verbally with patient Type: hematoma Body area: head/neck Location details: left external ear Anesthesia: nerve block (ear block performed, see laceration repair note) Patient sedated: no Scalpel size: 11 Incision type: elliptical Drainage: bloody Drainage amount: moderate Patient tolerance: Patient tolerated the procedure well with no immediate complications. Comments: Incision and drainage performed on a moderately sized hematoma of the left outer ear.  After the ear and surrounding skin was cleaned with betadine x 3 and sterile saline a nerve block was performed.  Noted a laceration at the edge of the ear that extended to from the helix to the antihelix.  Using the #11 blade, placed (extending from the existing laceration) a small elliptical shaped incision that followed the contour/fold of the antihelix.  Evacuated a moderate amount of blood from the hematoma and observed the tense swelling of the ear to be resolved.  Performed a primary repair of the skin defects of the edge of the ear and on the posterior surface of the ear.  The incision was allowed to remain open to allow drainage of the remaining hematoma.  Covered the anterior and posterior surfaces of the ear with bacitracin ointment.  Placed a firm compression dressing that applied pressure to the anterior and posterior ear to help prevent any reexpansion of the hematoma.     (including critical care time)  Labs Reviewed - No data to display No results found.   No diagnosis found.    MDM  Pt presents for evaluation after a mechanical fall.  She reports some neck discomfort and has a small laceration and a left ear hematoma.  Secondary to impacted cerumen, I cannot visualize the left TM however her hearing appears to be intact.  She takes a daily 81 mg aspirin but no other blood thinners.  Will obtain a CT scan of the head and c-spine to further evaluate for acute  injury.  Will likely drain the hematoma of the ear and apply a pressure dressing.  0230.  See the procedure notes.  Pt is awake and alert.  The imaging demonstrates no acute intercranial or cervical spine injuries.  Plan discharge home.  Encouraged her to follow-up with her ENT specialist here in town for further evaluation of her ear and suture removal.  She has been advised that she can take down the dressing on her ear after 36-48 hours.  She can also see her Engineer, petroleum in [place].       Tobin Chad, MD 04/08/12 [phone]

## 2012-04-07 NOTE — ED Notes (Signed)
Pt reports to the ED following fall a sitting position. She hit her head on the dresser. Small laceration to the left ear. Pt denies LOC. She has had a recent eye surgery and was covering her eye after using hairspray and slid off of the bed. Pt denies passing out. Pt arrives via [facility] on the [facility]. Bleeding is controlled. Pt is in NAD.

## 2012-04-08 ENCOUNTER — Emergency Department (HOSPITAL_COMMUNITY): Payer: Medicare Other

## 2012-04-08 DIAGNOSIS — S0990XA Unspecified injury of head, initial encounter: Secondary | ICD-10-CM | POA: Diagnosis not present

## 2012-04-08 DIAGNOSIS — S0993XA Unspecified injury of face, initial encounter: Secondary | ICD-10-CM | POA: Diagnosis not present

## 2012-04-08 MED ORDER — ACETAMINOPHEN 325 MG PO TABS
650.0000 mg | ORAL_TABLET | Freq: Once | ORAL | Status: AC
  Administered 2012-04-08: 650 mg via ORAL
  Filled 2012-04-08: qty 3

## 2012-04-11 ENCOUNTER — Encounter: Payer: Self-pay | Admitting: Internal Medicine

## 2012-04-11 ENCOUNTER — Ambulatory Visit (INDEPENDENT_AMBULATORY_CARE_PROVIDER_SITE_OTHER): Payer: Medicare Other | Admitting: Internal Medicine

## 2012-04-11 VITALS — BP 128/80 | HR 77 | Temp 97.8°F | Resp 12 | Wt 160.0 lb

## 2012-04-11 DIAGNOSIS — S01309A Unspecified open wound of unspecified ear, initial encounter: Secondary | ICD-10-CM | POA: Diagnosis not present

## 2012-04-11 DIAGNOSIS — T148XXA Other injury of unspecified body region, initial encounter: Secondary | ICD-10-CM | POA: Diagnosis not present

## 2012-04-11 DIAGNOSIS — S01319A Laceration without foreign body of unspecified ear, initial encounter: Secondary | ICD-10-CM

## 2012-04-11 MED ORDER — NAPROXEN SODIUM 220 MG PO TABS: 220.0000 mg | ORAL_TABLET | Freq: Two times a day (BID) | ORAL | Status: DC

## 2012-04-11 MED ORDER — ACETAMINOPHEN 500 MG PO TABS: 500.0000 mg | ORAL_TABLET | Freq: Four times a day (QID) | ORAL | Status: DC | PRN

## 2012-04-11 MED ORDER — OMEPRAZOLE 20 MG PO CPDR: 20.0000 mg | DELAYED_RELEASE_CAPSULE | Freq: Two times a day (BID) | ORAL | Status: DC

## 2012-04-11 NOTE — Progress Notes (Signed)
Subjective:    Patient ID: [name], female    DOB: 1941-03-09, 71 y.o.   MRN: [id number]  HPI [name] had a fall at home striking the left ear and head on a dresser in her bedroom. She sustained a laceration to the left ear and an immediate large hematoma of the helix. She was seen at [facility] ED. CT brain and C-spine were negative. She had multiple bruises to left shoulder, left hip and right thumb at 1st phalanx. In the ED she had primary closure of the helix laceration posterior aspect w/ 7 interrupted sutures; I&D of the anterior helix to release a hematoma that was causing tense swelling of the ear. Her exam was non-focal. She did not have a syncopal episode but lost her footing.   At today's visit she c/o being very sore in the mid-riff area, sore at the left hip and shoulder and the right hand. Her ear remains very tender.   She remembers moving about her room but does not remember falling although she remembers hitting her head. She has had falls in the past - and at that time did not remember actually falling.   Past Medical History  Diagnosis Date  . Benign neoplasm of colon   . Osteoarthritis   . Hiatal hernia   . Ischemia   . Diarrhea   . Blurred vision   . Back pain   . Gastroenteritis   . Chronic maxillary sinusitis   . Depression   . Eustachian tube dysfunction   . Tardive dyskinesia   . Dyspnea   . Mitral valve prolapse   . Osteoarthritis   . GERD (gastroesophageal reflux disease)   . Allergic rhinitis   . Glaucoma(365)   . Hypothyroid   . Hypertension    Past Surgical History  Procedure Date  . Knee surgery     x 4  . 1 baker cyst removed   . Dilation and curettage of uterus     x3  . Lumbar spine surgery   . Abdominal hysterectomy   . Eye surgery   . Spine surgery    Family History  Problem Relation Age of Onset  . Cancer Mother     Breast Cancer with Metastatic disease  . Alzheimer's disease Father    History   Social History  . Marital  Status: Widowed    Spouse Name: N/A    Number of Children: N/A  . Years of Education: N/A   Occupational History  . Not on file.   Social History Main Topics  . Smoking status: Never Smoker   . Smokeless tobacco: Not on file  . Alcohol Use: No  . Drug Use: No  . Sexually Active: Not on file   Other Topics Concern  . 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 acetaminophen (TYLENOL) 500 MG tablet Take 1,000 mg by mouth every 6 (six) hours as needed. For pain      . aspirin 81 MG tablet Take 81 mg by mouth daily.        Marland Kitchen atenolol (TENORMIN) 50 MG tablet Take 25 mg by mouth daily.      . Calcium Carbonate-Vitamin D (CALCIUM 600+D) 600-200 MG-UNIT TABS Take 2 tablets by mouth daily.        Marland Kitchen  Calcium Citrate (CITRACAL PO) Take 3 tablets by mouth at bedtime.      . carboxymethylcellulose (REFRESH TEARS) 0.5 % SOLN Place 1 drop into both eyes 2 (two) times daily.      . cholecalciferol (VITAMIN D) 1000 UNITS tablet Take 1,000 Units by mouth daily.        . diclofenac sodium (VOLTAREN) 1 % GEL Apply 2 g topically 4 (four) times daily.       [name] Calcium (STOOL SOFTENER PO) Take 2 tablets by mouth daily.      Marland Kitchen FLUoxetine (PROZAC) 20 MG capsule Take 1 capsule (20 mg total) by mouth daily.  90 capsule  3  . hydrOXYzine (VISTARIL) 25 MG capsule Take 25 mg by mouth at bedtime.      Marland Kitchen levothyroxine (SYNTHROID, LEVOTHROID) 175 MCG tablet Take 1 tablet (175 mcg total) by mouth daily.  90 tablet  1  . loratadine (ALAVERT) 10 MG tablet Take 10 mg by mouth daily.        Marland Kitchen LORazepam (ATIVAN) 0.5 MG tablet Take 1 tablet (0.5 mg total) by mouth 3 (three) times daily.  90 tablet  5  . lovastatin (MEVACOR) 40 MG tablet Take 1 tablet (40 mg total)  by mouth at bedtime.  90 tablet  1  . meclizine (ANTIVERT) 25 MG tablet Take 12.5 mg by mouth every 6 (six) hours as needed. For dizziness      . Multiple Vitamins-Iron (MULTIVITAMINS WITH IRON) TABS Take 1 tablet by mouth daily.      . Multiple Vitamins-Minerals (ICAPS PLUS PO) Take by mouth.        Marland Kitchen omeprazole (PRILOSEC) 20 MG capsule Take 1 capsule (20 mg total) by mouth 2 (two) times daily.      . promethazine (PHENERGAN) 25 MG tablet Take 12.5 mg by mouth every 6 (six) hours as needed. For nausea      . topiramate (TOPAMAX) 200 MG tablet Take 1 tablet (200 mg total) by mouth 2 (two) times daily.  180 tablet  1  . vitamin C (ASCORBIC ACID) 500 MG tablet Take 1,000 mg by mouth daily.      . vitamin E 400 UNIT capsule Take 400 Units by mouth daily.        . Artificial Tear Ointment (REFRESH LACRI-LUBE) OINT Place 1 application into both eyes daily.          Review of Systems System review is negative for any constitutional, cardiac, pulmonary, GI or neuro symptoms or complaints other than as described in the HPI.     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 No signs of other head trauma Cor- 2+ radial, RRR Pulm - normal respirations Neuro - A&O x 3, CN II-XII normal, normal gait Derm - 3 cm bruise left proximal UE, 7 cm bruise over left greater trochanter, bruising of right thenar imminence        Assessment & Plan:  Fall trauma -  The emergency department record, including the repair of the laceration to the ear, was reviewed. The CT of the brain and x-rays of the neck were normal . The  bruises on the shoulder, hip and hand will get better with  time. There is no blood clot and nothing dangerous from these bruises will happen. They will take some time to heal.   Plan - see Dr. Pollyann Kennedy tomorrow for removal of the stitches. APAP  For pain - ok to stop the tramadol. For now please stick with tylenol 500 mg every 6 hours and Aleve twice a day.

## 2012-04-11 NOTE — Patient Instructions (Addendum)
Sorry you had such a bad fall. The emergency department record, including the repair of the laceration to the ear, was reviewed. The CT of the brain and x-rays of the neck were normal . The bruises on the shoulder, hip and hand will get better with time. There is no blood clot and nothing dangerous from these bruises will happen. They will take some time to heal.   Plan - see Dr. Pollyann Kennedy tomorrow for removal of the stitches. APAP  For pain - ok to stop the tramadol. For now please stick with tylenol 500 mg every 6 hours and Aleve twice a day.

## 2012-04-12 DIAGNOSIS — S01309A Unspecified open wound of unspecified ear, initial encounter: Secondary | ICD-10-CM | POA: Diagnosis not present

## 2012-04-12 DIAGNOSIS — K219 Gastro-esophageal reflux disease without esophagitis: Secondary | ICD-10-CM | POA: Diagnosis not present

## 2012-04-16 ENCOUNTER — Other Ambulatory Visit: Payer: Self-pay | Admitting: *Deleted

## 2012-04-16 MED ORDER — LEVOTHYROXINE SODIUM 175 MCG PO TABS: 175.0000 ug | ORAL_TABLET | Freq: Every day | ORAL | Status: DC

## 2012-04-16 MED ORDER — TOPIRAMATE 200 MG PO TABS: 200.0000 mg | ORAL_TABLET | Freq: Two times a day (BID) | ORAL | Status: DC

## 2012-04-16 MED ORDER — LOVASTATIN 40 MG PO TABS: 40.0000 mg | ORAL_TABLET | Freq: Every day | ORAL | Status: DC

## 2012-04-19 DIAGNOSIS — R079 Chest pain, unspecified: Secondary | ICD-10-CM | POA: Diagnosis not present

## 2012-04-19 DIAGNOSIS — M546 Pain in thoracic spine: Secondary | ICD-10-CM | POA: Diagnosis not present

## 2012-04-19 DIAGNOSIS — M25519 Pain in unspecified shoulder: Secondary | ICD-10-CM | POA: Diagnosis not present

## 2012-04-19 DIAGNOSIS — M545 Low back pain, unspecified: Secondary | ICD-10-CM | POA: Diagnosis not present

## 2012-04-19 DIAGNOSIS — M25539 Pain in unspecified wrist: Secondary | ICD-10-CM | POA: Diagnosis not present

## 2012-05-03 DIAGNOSIS — H02409 Unspecified ptosis of unspecified eyelid: Secondary | ICD-10-CM | POA: Diagnosis not present

## 2012-05-03 DIAGNOSIS — H0230 Blepharochalasis unspecified eye, unspecified eyelid: Secondary | ICD-10-CM | POA: Diagnosis not present

## 2012-05-03 DIAGNOSIS — Z23 Encounter for immunization: Secondary | ICD-10-CM | POA: Diagnosis not present

## 2012-06-04 ENCOUNTER — Other Ambulatory Visit: Payer: Self-pay | Admitting: Internal Medicine

## 2012-06-06 ENCOUNTER — Ambulatory Visit (INDEPENDENT_AMBULATORY_CARE_PROVIDER_SITE_OTHER): Payer: Medicare Other | Admitting: Internal Medicine

## 2012-06-06 ENCOUNTER — Other Ambulatory Visit (INDEPENDENT_AMBULATORY_CARE_PROVIDER_SITE_OTHER): Payer: Medicare Other

## 2012-06-06 ENCOUNTER — Ambulatory Visit (INDEPENDENT_AMBULATORY_CARE_PROVIDER_SITE_OTHER)
Admission: RE | Admit: 2012-06-06 | Discharge: 2012-06-06 | Disposition: A | Discharge status: Home / Self Care | Payer: Medicare Other | Source: Ambulatory Visit | Attending: Internal Medicine | Admitting: Internal Medicine

## 2012-06-06 ENCOUNTER — Encounter: Payer: Self-pay | Admitting: Internal Medicine

## 2012-06-06 VITALS — BP 122/68 | HR 73 | Resp 10 | Wt 160.1 lb

## 2012-06-06 DIAGNOSIS — M79609 Pain in unspecified limb: Secondary | ICD-10-CM

## 2012-06-06 DIAGNOSIS — R202 Paresthesia of skin: Secondary | ICD-10-CM

## 2012-06-06 DIAGNOSIS — M19079 Primary osteoarthritis, unspecified ankle and foot: Secondary | ICD-10-CM | POA: Diagnosis not present

## 2012-06-06 DIAGNOSIS — E039 Hypothyroidism, unspecified: Secondary | ICD-10-CM

## 2012-06-06 DIAGNOSIS — M79671 Pain in right foot: Secondary | ICD-10-CM

## 2012-06-06 DIAGNOSIS — R209 Unspecified disturbances of skin sensation: Secondary | ICD-10-CM

## 2012-06-06 NOTE — Patient Instructions (Addendum)
Foot pain right after a fall. The toe on exam does seem broken.  Plan X-ray of the left foot to rule out fracture   Firm soled shoes  Paresthesia both feet - will check to be sure there is not a B12 deficiency or thyroid problem as the cause Plan Lab today.

## 2012-06-08 ENCOUNTER — Telehealth: Payer: Self-pay | Admitting: Internal Medicine

## 2012-06-08 NOTE — Telephone Encounter (Signed)
Patient has questions about her x-ray and lab results

## 2012-06-08 NOTE — Telephone Encounter (Signed)
Pt called requesting results from her x-ray and lab work. Please advise.

## 2012-06-10 ENCOUNTER — Encounter: Payer: Self-pay | Admitting: Internal Medicine

## 2012-06-10 NOTE — Progress Notes (Signed)
Subjective:    Patient ID: [name], female    DOB: Oct 26, 1940, 72 y.o.   MRN: [id number]  HPI [name] presents for evaluation of her toe. She tripped and caught her toe and has had great pain. She is worried the toe is broken. She has been able to ambulate w/o use of cane or walker.  PMH, FamHx and SocHx reviewed for any changes and relevance. Current Outpatient Prescriptions on File Prior to Visit  Medication Sig Dispense Refill  . acetaminophen (TYLENOL) 500 MG tablet Take 1,000 mg by mouth every 6 (six) hours as needed. For pain      . acetaminophen (TYLENOL) 500 MG tablet Take 1 tablet (500 mg total) by mouth every 6 (six) hours as needed for pain.  30 tablet  0  . Artificial Tear Ointment (REFRESH LACRI-LUBE) OINT Place 1 application into both eyes daily.      Marland Kitchen aspirin 81 MG tablet Take 81 mg by mouth daily.        Marland Kitchen atenolol (TENORMIN) 50 MG tablet Take 25 mg by mouth daily.      . Calcium Carbonate-Vitamin D (CALCIUM 600+D) 600-200 MG-UNIT TABS Take 2 tablets by mouth daily.        . Calcium Citrate (CITRACAL PO) Take 3 tablets by mouth at bedtime.      . carboxymethylcellulose (REFRESH TEARS) 0.5 % SOLN Place 1 drop into both eyes 2 (two) times daily.      . cholecalciferol (VITAMIN D) 1000 UNITS tablet Take 1,000 Units by mouth daily.        . diclofenac sodium (VOLTAREN) 1 % GEL Apply 2 g topically 4 (four) times daily.       [name] Calcium (STOOL SOFTENER PO) Take 2 tablets by mouth daily.      Marland Kitchen FLUoxetine (PROZAC) 20 MG capsule Take 1 capsule (20 mg total) by mouth daily.  90 capsule  3  . hydrOXYzine (VISTARIL) 25 MG capsule Take 25 mg by mouth at bedtime.      Marland Kitchen levothyroxine (SYNTHROID, LEVOTHROID) 175 MCG tablet Take 1 tablet (175 mcg total) by mouth daily.  90 tablet  1  . loratadine (ALAVERT) 10 MG tablet Take 10 mg by mouth daily.        Marland Kitchen LORazepam (ATIVAN) 0.5 MG tablet TAKE 1 TABLET UP TO 3 TIMES DAILY AS NEEDED  90 tablet  5  . lovastatin (MEVACOR) 40  MG tablet Take 1 tablet (40 mg total) by mouth at bedtime.  90 tablet  1  . meclizine (ANTIVERT) 25 MG tablet Take 12.5 mg by mouth every 6 (six) hours as needed. For dizziness      . Multiple Vitamins-Iron (MULTIVITAMINS WITH IRON) TABS Take 1 tablet by mouth daily.      . Multiple Vitamins-Minerals (ICAPS PLUS PO) Take by mouth.        . naproxen sodium (ALEVE) 220 MG tablet Take 1 tablet (220 mg total) by mouth 2 (two) times daily with a meal.      . omeprazole (PRILOSEC) 20 MG capsule Take 1 capsule (20 mg total) by mouth 2 (two) times daily.      . promethazine (PHENERGAN) 25 MG tablet Take 12.5 mg by mouth every 6 (six) hours as needed. For nausea      . topiramate (TOPAMAX) 200 MG tablet Take 1 tablet (200 mg total) by mouth 2 (two) times daily.  180 tablet  1  . vitamin C (ASCORBIC ACID) 500 MG tablet  Take 1,000 mg by mouth daily.      . vitamin E 400 UNIT capsule Take 400 Units by mouth daily.            Review of Systems System review is negative for any constitutional, cardiac, pulmonary, GI or neuro symptoms or complaints other than as described in the HPI.     Objective:   Physical Exam Filed Vitals:   06/06/12 1122  BP: 122/68  Pulse: 73  Resp: 10   Gen'l- WNWD white woman in no acute distress Cor- RRR Pulm - normal respirations Neuro - A&O x 3, preserved sensation left foot to light touch, pin prick, but decreased sensation to deep vibration. MSK - right foot examined: toes w/o deformity, swelling or redness. With manipulation the toe(s) move well with extreme pain.  X-ray left foot: LEFT FOOT - 2 VIEW  Comparison: None  Findings: Negative for fracture. Degenerative change in the fifth  PIP. No other significant degenerative change or erosion is  identified.  IMPRESSION:  Negative for fracture.        Assessment & Plan:  Sprained toe - no evidence of fracture on physical exam or x-ray  Plan-  firm soled shoe  NSAID & APAP for pain

## 2012-06-10 NOTE — Telephone Encounter (Signed)
msg sent - call by staff

## 2012-06-10 NOTE — Assessment & Plan Note (Signed)
Complaint of loss of sensation feet. On exam - decreased deep vibratory sensation. She is treated for hypothyroidism but has no h/o diabetes  Plan Lab for thyroid and B12 levels  Addendum: B12 and thyroid normal

## 2012-06-11 ENCOUNTER — Telehealth: Payer: Self-pay | Admitting: *Deleted

## 2012-06-11 NOTE — Telephone Encounter (Signed)
Called pt to let her know that the xray showed no fracture. Pt states she is still in a lot of pain. Both toes (toe next to great toe) on both feet are hurting. Pt is taking pain meds and pain eases some. Pt states she is waking every hour on the hour. She asks is there anything else she can do to help with the pain? Also, she requested results from lab work done last week. Please advise.

## 2012-06-11 NOTE — Telephone Encounter (Signed)
1. Lab letter sent out today. Labs were normal. Faster results if she signs up for MyChart. 2. Can buddy tape toes, hard soled shoe. 3. Increase aleve to 2 tablets every 12 hours. Watch for stomach irritation

## 2012-06-12 NOTE — Telephone Encounter (Signed)
Called pt and advised her per Dr Debby Bud to take the hydrocodone/apap every 8 hrs as needed for pain and to elevate her foot to take the pressure off. To call back on Thursday if no better.

## 2012-06-12 NOTE — Telephone Encounter (Signed)
May take hydrocodone/apap every 8 hours as needed

## 2012-06-12 NOTE — Telephone Encounter (Signed)
Called pt and told her to buddy tape her toes and wear hard soled shoes. Pt states she has buddy taped her toes. Pt is taking hydrocodone-acetaminophen 5-325 mg, 1 a day. She takes it when she absolutely has to. Pt is still in pain. Please advise.

## 2012-06-14 ENCOUNTER — Telehealth: Payer: Self-pay | Admitting: *Deleted

## 2012-06-14 NOTE — Telephone Encounter (Signed)
It will take time, up to several weeks, for a badly sprained toe to heal and not hurt. If she is worried we can set her up to see an orthopedic specialist.

## 2012-06-14 NOTE — Telephone Encounter (Signed)
Pt called stating she still has pain in her left foot. It is not throbbing like it was. Still has some pain in the right foot as well, but not like it was. Pt is taking her pain meds as Dr Debby Bud directed. Please advise.

## 2012-06-19 ENCOUNTER — Telehealth: Payer: Self-pay | Admitting: *Deleted

## 2012-06-19 MED ORDER — HYDROXYZINE PAMOATE 25 MG PO CAPS: 25.0000 mg | ORAL_CAPSULE | Freq: Every day | ORAL | Status: DC

## 2012-06-19 NOTE — Telephone Encounter (Signed)
Spoke with pt and she would like to have a referral for the orthopedic specialist.

## 2012-06-19 NOTE — Telephone Encounter (Signed)
Called pt left detailed vm telling her per Dr Debby Bud that it will take time, up to several weeks, for a badly sprained toe to heal and not hurt. If she is worried we can set her up to see an orthopedic specialist. Told her to call if she feels she needs this referral to the orthopedic specialist.

## 2012-07-20 ENCOUNTER — Telehealth: Payer: Self-pay | Admitting: Internal Medicine

## 2012-07-20 NOTE — Telephone Encounter (Signed)
Patient requesting call back from assistant to discuss some concerns

## 2012-07-25 ENCOUNTER — Telehealth: Payer: Self-pay | Admitting: *Deleted

## 2012-07-25 NOTE — Telephone Encounter (Signed)
Pt continues to have problems/pain in both her feet. Pt is now stating she has a pain in her left side. Pt to see Dr Debby Bud on Monday, March 3rd at 1:00 pm.

## 2012-07-30 ENCOUNTER — Ambulatory Visit (INDEPENDENT_AMBULATORY_CARE_PROVIDER_SITE_OTHER): Payer: Medicare Other | Admitting: Internal Medicine

## 2012-07-30 ENCOUNTER — Other Ambulatory Visit (INDEPENDENT_AMBULATORY_CARE_PROVIDER_SITE_OTHER): Payer: Medicare Other

## 2012-07-30 ENCOUNTER — Encounter: Payer: Self-pay | Admitting: Internal Medicine

## 2012-07-30 VITALS — BP 126/72 | HR 76 | Temp 99.2°F | Resp 10 | Wt 160.0 lb

## 2012-07-30 DIAGNOSIS — R10A2 Flank pain, left side: Secondary | ICD-10-CM

## 2012-07-30 DIAGNOSIS — F411 Generalized anxiety disorder: Secondary | ICD-10-CM

## 2012-07-30 DIAGNOSIS — R202 Paresthesia of skin: Secondary | ICD-10-CM

## 2012-07-30 DIAGNOSIS — I10 Essential (primary) hypertension: Secondary | ICD-10-CM

## 2012-07-30 DIAGNOSIS — R109 Unspecified abdominal pain: Secondary | ICD-10-CM

## 2012-07-30 DIAGNOSIS — K219 Gastro-esophageal reflux disease without esophagitis: Secondary | ICD-10-CM

## 2012-07-30 DIAGNOSIS — R1032 Left lower quadrant pain: Secondary | ICD-10-CM

## 2012-07-30 DIAGNOSIS — R209 Unspecified disturbances of skin sensation: Secondary | ICD-10-CM

## 2012-07-30 DIAGNOSIS — G5690 Unspecified mononeuropathy of unspecified upper limb: Secondary | ICD-10-CM

## 2012-07-30 DIAGNOSIS — M792 Neuralgia and neuritis, unspecified: Secondary | ICD-10-CM

## 2012-07-30 DIAGNOSIS — M1990 Unspecified osteoarthritis, unspecified site: Secondary | ICD-10-CM

## 2012-07-30 LAB — CBC WITH DIFFERENTIAL/PLATELET
Eosinophils Absolute: 0.2 10*3/uL (ref 0.0–0.7)
Eosinophils Relative: 2.9 % (ref 0.0–5.0)
HCT: 41.2 % (ref 36.0–46.0)
Lymphs Abs: 2 10*3/uL (ref 0.7–4.0)
MCHC: 34.3 g/dL (ref 30.0–36.0)
MCV: 93.3 fl (ref 78.0–100.0)
Monocytes Absolute: 0.5 10*3/uL (ref 0.1–1.0)
Platelets: 148 10*3/uL — ABNORMAL LOW (ref 150.0–400.0)
RDW: 13.1 % (ref 11.5–14.6)
WBC: 7.6 10*3/uL (ref 4.5–10.5)

## 2012-07-30 LAB — URINALYSIS, ROUTINE W REFLEX MICROSCOPIC
Bilirubin Urine: NEGATIVE
Hgb urine dipstick: NEGATIVE
Nitrite: NEGATIVE
Total Protein, Urine: NEGATIVE
Urine Glucose: NEGATIVE
Urobilinogen, UA: 0.2 (ref 0.0–1.0)

## 2012-07-30 MED ORDER — GABAPENTIN 300 MG PO CAPS: 300.0000 mg | ORAL_CAPSULE | Freq: Every day | ORAL | Status: DC

## 2012-07-30 NOTE — Patient Instructions (Addendum)
1. Abdominal pain - on exam there is a diffuse tenderness perhaps worse at the left lower quadrant. Concern for possible Urinary track infection. No evidence of an acute problem like infection of the colon or other serious problem. There are good  Bowel sounds. No evidence of gallbladder or liver disease.  Plan Lab: urinalysis, complete blood count to look for infection  Avoid Hydrocodone if possible - a narcotic that can cause problems with the bowels  Take metamucil or a similar product once a day.  2. 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  Plan OK to continue with the Voltaren gel to the sore joints  Good fitting shoes  Tylenol 500 mg taking 1 or 2 tablets three times a day. ( if you take the hydrocodone with tylenol that will count as a tylenol dose)  3. 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 B12 and Thyroid were checked in January and were normal.  Plan  gabapentin 300 mg at bedtime for 10 days then may increase to twice a day if needed for the pain.  Nerve conduction studies to help confirm the diagnosis.   It is better to not take narcotics, e.g. Hydrocodone - it is addicting and causes bowel problems.    Osteoarthritis Osteoarthritis is the most common form of arthritis. It is redness, soreness, and swelling (inflammation) affecting the cartilage. Cartilage acts as a cushion, covering the ends of bones where they meet to form a joint. CAUSES  Over time, the cartilage begins to wear away. This causes bone to rub on bone. This produces pain and stiffness in the affected joints. Factors that contribute to this problem are:  Excessive body weight.  Age.  Overuse of joints. SYMPTOMS   People with osteoarthritis usually experience joint pain, swelling, or stiffness.  Over time, the joint may lose its normal shape.  Small deposits of bone (osteophytes) may grow on the edges  of the joint.  Bits of bone or cartilage can break off and float inside the joint space. This may cause more pain and damage.  Osteoarthritis can lead to depression, anxiety, feelings of helplessness, and limitations on daily activities. The most commonly affected joints are in the:  Ends of the fingers.  Thumbs.  Neck.  Lower back.  Knees.  Hips. DIAGNOSIS  Diagnosis is mostly based on your symptoms and exam. Tests may be helpful, including:  X-rays of the affected joint.  A computerized magnetic scan (MRI).  Blood tests to rule out other types of arthritis.  Joint fluid tests. This involves using a needle to draw fluid from the joint and examining the fluid under a microscope. TREATMENT  Goals of treatment are to control pain, improve joint function, maintain a normal body weight, and maintain a healthy lifestyle. Treatment approaches may include:  A prescribed exercise program with rest and joint relief.  Weight control with nutritional education.  Pain relief techniques such as:  Properly applied heat and cold.  Electric pulses delivered to nerve endings under the skin (transcutaneous electrical nerve stimulation, TENS).  Massage.  Certain supplements. Ask your caregiver before using any supplements, especially in combination with prescribed drugs.  Medicines to control pain, such as:  Acetaminophen.  Nonsteroidal anti-inflammatory drugs (NSAIDs), such as naproxen.  Narcotic or central-acting agents, such as tramadol. This drug carries a risk of addiction and is generally prescribed for short-term use.  Corticosteroids. These can be given orally or as injection. This is a short-term treatment, not recommended for routine use.  Surgery to reposition the bones and relieve pain (osteotomy) or to remove loose pieces of bone and cartilage. Joint replacement may be needed in advanced states of osteoarthritis. HOME CARE INSTRUCTIONS  Your caregiver can recommend  specific types of exercise. These may include:  Strengthening exercises. These are done to strengthen the muscles that support joints affected by arthritis. They can be performed with weights or with exercise bands to add resistance.  Aerobic activities. These are exercises, such as brisk walking or low-impact aerobics, that get your heart pumping. They can help keep your lungs and circulatory system in shape.  Range-of-motion activities. These keep your joints limber.  Balance and agility exercises. These help you maintain daily living skills. Learning about your condition and being actively involved in your care will help improve the course of your osteoarthritis. SEEK MEDICAL CARE IF:   You feel hot or your skin turns red.  You develop a rash in addition to your joint pain.  You have an oral temperature above 102 F (38.9 C). FOR MORE INFORMATION  National Institute of Arthritis and Musculoskeletal and Skin Diseases: www.niams.http://www.myers.net/ [employer] on Aging: https://walker.com/ American College of Rheumatology: www.rheumatology.org Document Released: 05/16/2005 Document Revised: 08/08/2011 Document Reviewed: 08/27/2009 [facility] Patient Information 2013 [place], [place].   Neuropathic Pain We often think that pain has a physical cause. If we get rid of the cause, the pain should go away. Nerves themselves can also cause pain. It is called neuropathic pain, which means nerve abnormality. It may be difficult for the patients who have it and for the treating caregivers. Pain is usually described as acute (short-lived) or chronic (long-lasting). Acute pain is related to the physical sensations caused by an injury. It can last from a few seconds to many weeks, but it usually goes away when normal healing occurs. Chronic pain lasts beyond the typical healing time. With neuropathic pain, the nerve fibers themselves may be damaged or injured. They then send incorrect signals to other pain  centers. The pain you feel is real, but the cause is not easy to find.  CAUSES  Chronic pain can result from diseases, such as diabetes and shingles (an infection related to chickenpox), or from trauma, surgery, or amputation. It can also happen without any known injury or disease. The nerves are sending pain messages, even though there is no identifiable cause for such messages.   Other common causes of neuropathy include diabetes, phantom limb pain, or Regional Pain Syndrome (RPS).  As with all forms of chronic back pain, if neuropathy is not correctly treated, there can be a number of associated problems that lead to a downward cycle for the patient. These include depression, sleeplessness, feelings of fear and anxiety, limited social interaction and inability to do normal daily activities or work.  The most dramatic and mysterious example of neuropathic pain is called "phantom limb syndrome." This occurs when an arm or a leg has been removed because of illness or injury. The brain still gets pain messages from the nerves that originally carried impulses from the missing limb. These nerves now seem to misfire and cause troubling pain.  Neuropathic pain often seems to have no cause. It responds poorly to standard pain treatment. Neuropathic pain can occur after:  Shingles (herpes zoster virus infection).  A lasting burning sensation of the skin, caused usually by injury to a peripheral nerve.  Peripheral  neuropathy which is widespread nerve damage, often caused by diabetes or alcoholism.  Phantom limb pain following an amputation.  Facial nerve problems (trigeminal neuralgia).  Multiple sclerosis.  Reflex sympathetic dystrophy.  Pain which comes with cancer and cancer chemotherapy.  Entrapment neuropathy such as when pressure is put on a nerve such as in carpal tunnel syndrome.  Back, leg, and hip problems (sciatica).  Spine or back surgery.  HIV Infection or AIDS where nerves  are infected by viruses. Your caregiver can explain items in the above list which may apply to you. SYMPTOMS  Characteristics of neuropathic pain are:  Severe, sharp, electric shock-like, shooting, lightening-like, knife-like.  Pins and needles sensation.  Deep burning, deep cold, or deep ache.  Persistent numbness, tingling, or weakness.  Pain resulting from light touch or other stimulus that would not usually cause pain.  Increased sensitivity to something that would normally cause pain, such as a pinprick. Pain may persist for months or years following the healing of damaged tissues. When this happens, pain signals no longer sound an alarm about current injuries or injuries about to happen. Instead, the alarm system itself is not working correctly.  Neuropathic pain may get worse instead of better over time. For some people, it can lead to serious disability. It is important to be aware that severe injury in a limb can occur without a proper, protective pain response.Burns, cuts, and other injuries may go unnoticed. Without proper treatment, these injuries can become infected or lead to further disability. Take any injury seriously, and consult your caregiver for treatment. DIAGNOSIS  When you have a pain with no known cause, your caregiver will probably ask some specific questions:   Do you have any other conditions, such as diabetes, shingles, multiple sclerosis, or HIV infection?  How would you describe your pain? (Neuropathic pain is often described as shooting, stabbing, burning, or searing.)  Is your pain worse at any time of the day? (Neuropathic pain is usually worse at night.)  Does the pain seem to follow a certain physical pathway?  Does the pain come from an area that has missing or injured nerves? (An example would be phantom limb pain.)  Is the pain triggered by minor things such as rubbing against the sheets at night? These questions often help define the type of  pain involved. Once your caregiver knows what is happening, treatment can begin. Anticonvulsant, antidepressant drugs, and various pain relievers seem to work in some cases. If another condition, such as diabetes is involved, better management of that disorder may relieve the neuropathic pain.  TREATMENT  Neuropathic pain is frequently long-lasting and tends not to respond to treatment with narcotic type pain medication. It may respond well to other drugs such as antiseizure and antidepressant medications. Usually, neuropathic problems do not completely go away, but partial improvement is often possible with proper treatment. Your caregivers have large numbers of medications available to treat you. Do not be discouraged if you do not get immediate relief. Sometimes different medications or a combination of medications will be tried before you receive the results you are hoping for. See your caregiver if you have pain that seems to be coming from nowhere and does not go away. Help is available.  SEEK IMMEDIATE MEDICAL CARE IF:   There is a sudden change in the quality of your pain, especially if the change is on only one side of the body.  You notice changes of the skin, such as redness, black or purple  discoloration, swelling, or an ulcer.  You cannot move the affected limbs. Document Released: 02/11/2004 Document Revised: 08/08/2011 Document Reviewed: 02/11/2004 [facility] Patient Information 2013 [place], [place].

## 2012-07-30 NOTE — Progress Notes (Signed)
Subjective:    Patient ID: [name], female    DOB: 1940-07-28, 72 y.o.   MRN: [id number]  HPI [name] was seen Jan 8th for foot pain and paresthesia of her feet. She had left foot films - no fracture seen but she did have DJD changes. She now presents for increased pain in her feet. volatren gel gives her some relief. She alswo complains for worsening paresthesia. In January B12 and TSH were checked and found to be normal. She has also been self-treating with taping of the toes, use of ice--packs. She is not a diabetic. Reviewed med list - no culprit drugs.   She has had t+ weeks of left upper quadrant pain which she rates as a 7-8/10. She denies N/V/D but has had loose stools 2-3 times a day. No blood in the stool, no mucus in the stools. No fecal incontinence. She will have urgency and has had "accident" if she doesn't get to the commode on time.   Past Medical History  Diagnosis Date  . Benign neoplasm of colon   . Osteoarthritis   . Hiatal hernia   . Ischemia   . Diarrhea   . Blurred vision   . Back pain   . Gastroenteritis   . Chronic maxillary sinusitis   . Depression   . Eustachian tube dysfunction   . Tardive dyskinesia   . Dyspnea   . Mitral valve prolapse   . Osteoarthritis   . GERD (gastroesophageal reflux disease)   . Allergic rhinitis   . Glaucoma(365)   . Hypothyroid   . Hypertension    Past Surgical History  Procedure Laterality Date  . Knee surgery      x 4  . 1 baker cyst removed    . Dilation and curettage of uterus      x3  . Lumbar spine surgery    . Abdominal hysterectomy    . Eye surgery    . Spine surgery     Family History  Problem Relation Age of Onset  . Cancer Mother     Breast Cancer with Metastatic disease  . Alzheimer's disease Father    History   Social History  . Marital Status: Widowed    Spouse Name: N/A    Number of Children: N/A  . Years of Education: N/A   Occupational History  . Not on file.   Social History  Main Topics  . Smoking status: Never Smoker   . Smokeless tobacco: Not on file  . Alcohol Use: No  . Drug Use: No  . Sexually Active: Not on file   Other Topics Concern  . Not on file   Social History Narrative   [employer]   [employer]   Married: '59, '73 widowed; Married '77- 4 years, divorced   2 sons- '60, '61 : 1 granddaughter   Work: Hair Dresser   Lives alone   Patient has never smoked    Current Outpatient Prescriptions on File Prior to Visit  Medication Sig Dispense Refill  . acetaminophen (TYLENOL) 500 MG tablet Take 1 tablet (500 mg total) by mouth every 6 (six) hours as needed for pain.  30 tablet  0  . Artificial Tear Ointment (REFRESH LACRI-LUBE) OINT Place 1 application into both eyes daily.      Marland Kitchen aspirin 81 MG tablet Take 81 mg by mouth daily.        Marland Kitchen atenolol (TENORMIN) 50 MG tablet Take 25 mg by mouth daily.      Marland Kitchen  Calcium Carbonate-Vitamin D (CALCIUM 600+D) 600-200 MG-UNIT TABS Take 2 tablets by mouth daily.        . Calcium Citrate (CITRACAL PO) Take 3 tablets by mouth at bedtime.      . carboxymethylcellulose (REFRESH TEARS) 0.5 % SOLN Place 1 drop into both eyes 2 (two) times daily.      . cholecalciferol (VITAMIN D) 1000 UNITS tablet Take 1,000 Units by mouth daily.        . diclofenac sodium (VOLTAREN) 1 % GEL Apply 2 g topically 4 (four) times daily.       [name] Calcium (STOOL SOFTENER PO) Take 2 tablets by mouth daily.      Marland Kitchen FLUoxetine (PROZAC) 20 MG capsule Take 1 capsule (20 mg total) by mouth daily.  90 capsule  3  . hydrOXYzine (VISTARIL) 25 MG capsule Take 1 capsule (25 mg total) by mouth at bedtime.  30 capsule  1  . levothyroxine (SYNTHROID, LEVOTHROID) 175 MCG tablet Take 1 tablet (175 mcg total) by mouth daily.  90 tablet  1  . loratadine (ALAVERT) 10 MG tablet Take 10 mg by mouth daily.        Marland Kitchen LORazepam (ATIVAN) 0.5 MG tablet TAKE 1 TABLET UP TO 3 TIMES DAILY AS NEEDED  90 tablet  5  . lovastatin (MEVACOR) 40 MG tablet Take 1 tablet (40  mg total) by mouth at bedtime.  90 tablet  1  . meclizine (ANTIVERT) 25 MG tablet Take 12.5 mg by mouth every 6 (six) hours as needed. For dizziness      . Multiple Vitamins-Iron (MULTIVITAMINS WITH IRON) TABS Take 1 tablet by mouth daily.      Marland Kitchen omeprazole (PRILOSEC) 20 MG capsule Take 1 capsule (20 mg total) by mouth 2 (two) times daily.      . promethazine (PHENERGAN) 25 MG tablet Take 12.5 mg by mouth every 6 (six) hours as needed. For nausea      . topiramate (TOPAMAX) 200 MG tablet Take 1 tablet (200 mg total) by mouth 2 (two) times daily.  180 tablet  1  . vitamin C (ASCORBIC ACID) 500 MG tablet Take 1,000 mg by mouth daily.      . vitamin E 400 UNIT capsule Take 400 Units by mouth daily.        . naproxen sodium (ALEVE) 220 MG tablet Take 1 tablet (220 mg total) by mouth 2 (two) times daily with a meal.       No current facility-administered medications on file prior to visit.      Review of Systems System review is negative for any constitutional, cardiac, pulmonary, GI or neuro symptoms or complaints other than as described in the HPI.'    Objective:   Physical Exam Filed Vitals:   07/30/12 1307  BP: 126/72  Pulse: 76  Temp: 99.2 F (37.3 C)  Resp: 10   Wt Readings from Last 3 Encounters:  07/30/12 160 lb (72.576 kg)  06/06/12 160 lb 1.3 oz (72.612 kg)  04/11/12 160 lb (72.576 kg)   Gen'l- mildly overweight white woman in no distress Cor - 2+ radial and DP pulses Pulm - normal respirations Abd - BS hypoactive, no guarding or rebound. Diffusely tender to palpation, perhaps worse at the lower quadrants, no masses. No flank tenderness to percussion Ext - no deformity to the feet. Tender at the MTP joints diffusely. No synovial thickening, erythema. Neuro - normal sensation at the feet to light-touch but decreased vibratory sensation.  Assessment & Plan:

## 2012-07-31 ENCOUNTER — Telehealth: Payer: Self-pay | Admitting: *Deleted

## 2012-07-31 NOTE — Assessment & Plan Note (Signed)
Reexamination with persistent decrease in vibratory sensation but good circulation. B12 and TSH previously checked and normal.  Plan  NCS to help elucidate nature and origin of pain.   Gabapentin 300 mg qHS and as tolerated increase to BID.

## 2012-07-31 NOTE — Assessment & Plan Note (Signed)
BP Readings from Last 3 Encounters:  07/30/12 126/72  06/06/12 122/68  04/11/12 128/80   Good control

## 2012-07-31 NOTE — Assessment & Plan Note (Signed)
Left upper quadrant and flank pain may be GERD related. U/A is negative and WBC is normal arguing against infection.  Plan  Liquid antacid as needed for pain.  If no relief with antacid and persistent pain will need to consider reevaluation.

## 2012-07-31 NOTE — Assessment & Plan Note (Signed)
Foot pain - specifically at MTP joints of multiple toes. Previous x-ray c/w OA.  Plan APAP 500 mg tid for control of pain  Good foot wear.

## 2012-07-31 NOTE — Assessment & Plan Note (Signed)
A little anxious over her foot pain and flank pain but generally well managed.  Plan  Continue present medications

## 2012-07-31 NOTE — Telephone Encounter (Signed)
Called pt per Dr Debby Bud advised her that she had a normal blood count and negative urinalysis. For left upper quadrant abdominal pain please try taking Mylanta. Advised pt to use as directed for 1 week and then call to let us know how she is doing.

## 2012-08-08 ENCOUNTER — Telehealth: Payer: Self-pay

## 2012-08-08 NOTE — Telephone Encounter (Signed)
Pt called to inform MD that she is still experiencing LT side flank pain 6/10, pt is concerned that she may have a broken/fractured rib. Pt denies injury or trauma to that area. Pt also states that bilateral foot "burning" sensation has not improved either. Pt advised that per VIS she can increase Gabapentin to BID, pt will start tomorrow and call back if still no improvement.

## 2012-08-08 NOTE — Telephone Encounter (Signed)
1. For LUQ pain we are happy to order left rib films  2. For burning feet thanks for reminding her she can increase gaba

## 2012-08-09 ENCOUNTER — Telehealth: Payer: Self-pay | Admitting: Internal Medicine

## 2012-08-09 NOTE — Telephone Encounter (Signed)
Pt advised, please place order for left rib films

## 2012-08-09 NOTE — Telephone Encounter (Signed)
Order entered. She can come in Friday, 08/10/12

## 2012-08-09 NOTE — Telephone Encounter (Signed)
Patient Information:  Caller Name: Saiya  Phone: [phone]  Patient: [name]  Gender: Female  DOB: [birth year]  Age: 72 Years  PCP: Illene Regulus (Adults only)  Office Follow Up:  Does the office need to follow up with this patient?: Yes  Instructions For The Office: Call back regarding work in appointment today, 08/09/12, for dizziness.  RN Note:  Head feels "funny"; lightheaded and drowsy.; Took extra dose of Gabapentin this morning for left flank pain per office instruction; usually takes it only hs.  Asking if can reschedule Xray until 08/10/12.  Took short afternoon nap.  Can ambulate in home but not certian can drive. Willing to come to office for appointment. No appointments remain at office. Caller unfamiliar with location of [place] and [employer].  Message sent to [facility] for call back regarding possible work in appointment.  Please call back ASAP.   Symptoms  Reason For Call & Symptoms: Reports doubled up on Gabapentin (took one hs 08/08/12 and one at 1000 this morning) and feels dizzy.  Asking f OK to delay L flank xray until 08/10/12 because is not certian she can drive.  Reviewed Health History In EMR: Yes  Reviewed Medications In EMR: Yes  Reviewed Allergies In EMR: Yes  Reviewed Surgeries / Procedures: Yes  Date of Onset of Symptoms: 08/09/2012  Treatments Tried: fluids  Treatments Tried Worked: No  Guideline(s) Used:  Dizziness  Disposition Per Guideline:   Go to Office Now  Reason For Disposition Reached:   Lightheadedness (dizziness) present now, after 2 hours of rest and fluids  Advice Given:  Some Causes of Temporary Dizziness:  Standing Up Suddenly - Standing up suddenly (especially getting out of bed) or prolonged standing in one place are common causes of temporary dizziness. Not drinking enough fluids always makes it worse. Certain medications can cause or increase this type of dizziness (e.g., blood pressure medications).  Drink Fluids:  Drink several glasses of fruit juice, other clear fluids, or water. This will improve hydration and blood glucose. If you have a fever or have had heat exposure, make sure the fluids are cold.  Stand Up Slowly:  In the mornings, sit up for a few minutes before you stand up. That will help your blood flow make the adjustment.  If you have to stand up for long periods of time, contract and relax your leg muscles to help pump the blood back to the heart.  Sit down or lie down if you feel dizzy.  Call Back If:  Passes out (faints)  You become worse.

## 2012-08-10 ENCOUNTER — Telehealth: Payer: Self-pay

## 2012-08-10 ENCOUNTER — Ambulatory Visit (INDEPENDENT_AMBULATORY_CARE_PROVIDER_SITE_OTHER)
Admission: RE | Admit: 2012-08-10 | Discharge: 2012-08-10 | Disposition: A | Discharge status: Home / Self Care | Payer: Medicare Other | Source: Ambulatory Visit | Attending: Internal Medicine | Admitting: Internal Medicine

## 2012-08-10 DIAGNOSIS — R071 Chest pain on breathing: Secondary | ICD-10-CM

## 2012-08-10 NOTE — Telephone Encounter (Signed)
I returned pt's call from yesterday. She states she no longer is feeling dizzy since she only took the Gabapentin last night and did not take it this morning. She wanted to know if she could come in and get her xrays taken since she missed her appt yesterday. I told pt I spoke with xray and since her order is already in the computer she can come in anytime between 8:30-5:15pm. She understood and will come in sometime today to have this done.

## 2012-08-13 ENCOUNTER — Telehealth: Payer: Self-pay

## 2012-08-13 NOTE — Telephone Encounter (Signed)
Message copied by Noreene Larsson on Mon Aug 13, 2012  5:04 PM ------      Message from: Illene Regulus E      Created: Mon Aug 13, 2012  8:47 AM       Please call patient: no rib fracture! You may have had a compression fracture of the 10th thoracic vertebra, which could certainly be the origin of your pain. There is no specific treatment except pain medication. ------

## 2012-08-13 NOTE — Telephone Encounter (Signed)
Current med rec does not show hydrocodone or the dose if she is taking it.  For continued severe pain may stop hydrocodone and start oxycodone/APAP 7.5/325 - if she wants to go to a stronger narcotic she will need to pick up a prescription.  Will order MRI thoracic spine to determine if she has a compression fracture that may be suitable for kyphoplasty - bone cement injection.

## 2012-08-13 NOTE — Telephone Encounter (Signed)
Pt informed of results. She states she is taking the Gabapentin and Hydrocodone and she is still hurting bad. She states she hurts from her ribs down to her waist and her feet her bad. She wanted me to relay this to you.

## 2012-08-16 ENCOUNTER — Telehealth: Payer: Self-pay

## 2012-08-16 NOTE — Telephone Encounter (Signed)
Opened in error

## 2012-08-16 NOTE — Telephone Encounter (Signed)
I talked with pt because she is still in pain. I let her know an MRI will be ordered and someone will give her a call to schedule that. I asked her again is she taking Hydrocodone and now she states she is not. She is taking Hydroxyzine. She would like to start Oxycodone 7.5/325 mg. If you provide instructions I can get this ready for her to pick up. Also she is asking when she starts the Oxycodone is she still to take the Hydroxyzine and the Gabapentin? Please advise.

## 2012-08-17 ENCOUNTER — Telehealth: Payer: Self-pay | Admitting: *Deleted

## 2012-08-17 MED ORDER — OXYCODONE-ACETAMINOPHEN 5-325 MG PO TABS: 1.0000 | ORAL_TABLET | Freq: Four times a day (QID) | ORAL | Status: DC | PRN

## 2012-08-17 NOTE — Telephone Encounter (Signed)
Pt requesting call back from nurse re: her side/feet pain and MRI.

## 2012-08-17 NOTE — Telephone Encounter (Signed)
1. It appears the MRI has been ordered - will need to see if she is a candidate for kyphoplasty  2. Stop hydorcodone.     Oxycodone/APAP 5/325  1 every 6 hours as needed for pain. #90 no refills.   3. Continue the gabapentin for pain  4. Hydroxyzine is for itching and she can take it as needed.

## 2012-08-17 NOTE — Telephone Encounter (Signed)
Pt aware MRI has been ordered. She was advised per Dr Debby Bud to continue Gabapentin and also Hydroxyzine(as needed for itching). Script for Oxycodone was printed and signed by Dr Yetta Barre since Dr Debby Bud left for the day.

## 2012-08-17 NOTE — Telephone Encounter (Signed)
This has been taken care of ( see other phone message)

## 2012-08-24 ENCOUNTER — Telehealth: Payer: Self-pay | Admitting: Internal Medicine

## 2012-08-24 NOTE — Telephone Encounter (Signed)
Pt also req MRI from her hip down to her legs as well. Please advise.

## 2012-08-24 NOTE — Telephone Encounter (Signed)
As stated - needs OV

## 2012-08-24 NOTE — Telephone Encounter (Signed)
Patient Information:  Caller Name: Etoile  Phone: [phone]  Patient: [name]  Gender: Female  DOB: 11-28-1940  Age: 72 Years  PCP: Illene Regulus (Adults only)  Office Follow Up:  Does the office need to follow up with this patient?: Yes  Instructions For The Office: Patient would like follow up from office she has been seen several times for this and is concerned.  RN Note:  Patient would like Dr. Debby Bud to know what is going on. She has already to talk to someone in the office concerning her MRI .  Please contact patient [phone]  Symptoms  Reason For Call & Symptoms: Patient states she is having Left rib cage pain from bottom of her breast to her waist, around her left flank . Describes as constant pain.  Dr. Debby Bud saw patient and gave her Hydrocodone. She cannot take medication because it makes her dizzy "go into orbit. The pain is radiation down her waist. She is wanting MRI from bottom of breast to her waist and her hip  (2) Her feet and toes are sore/numb "like needles going through them she is asking for an MRI of these as well.  Reviewed Health History In EMR: Yes  Reviewed Medications In EMR: Yes  Reviewed Allergies In EMR: Yes  Reviewed Surgeries / Procedures: Yes  Date of Onset of Symptoms: 07/30/2012  Guideline(s) Used:  Chest Injury  Disposition Per Guideline:   See Today or Tomorrow in Office  Reason For Disposition Reached:   High-risk adult (e.g., age > 79, osteoporosis, chronic steroid use)  Advice Given:  Apply Heat to the Area:  Beginning 48 hours after an injury, apply a warm washcloth or heating pad for 10 minutes three times a day.  This will help increase blood flow and improve healing.  Call Back If:  You become worse.  RN Overrode Recommendation:  Follow Up With Office Later  Patient would like follow up from office she has been seen several times for this and is concerned.

## 2012-08-24 NOTE — Telephone Encounter (Signed)
Needs an OV before an MRI can be ordered.

## 2012-08-27 NOTE — Telephone Encounter (Signed)
Can you help schedule OV for pt-thank you

## 2012-08-27 NOTE — Telephone Encounter (Signed)
Pt has an appt with  norins on 08/30/12.

## 2012-08-29 NOTE — Telephone Encounter (Signed)
Appt on April 3.

## 2012-08-30 ENCOUNTER — Encounter: Payer: Self-pay | Admitting: Internal Medicine

## 2012-08-30 ENCOUNTER — Ambulatory Visit (INDEPENDENT_AMBULATORY_CARE_PROVIDER_SITE_OTHER): Payer: Medicare Other | Admitting: Internal Medicine

## 2012-08-30 ENCOUNTER — Other Ambulatory Visit: Payer: Self-pay | Admitting: Internal Medicine

## 2012-08-30 VITALS — BP 110/70 | HR 78 | Temp 97.0°F | Resp 12 | Ht 62.0 in | Wt 160.4 lb

## 2012-08-30 DIAGNOSIS — G609 Hereditary and idiopathic neuropathy, unspecified: Secondary | ICD-10-CM | POA: Diagnosis not present

## 2012-08-30 MED ORDER — GABAPENTIN 300 MG PO CAPS: 300.0000 mg | ORAL_CAPSULE | Freq: Three times a day (TID) | ORAL | Status: DC

## 2012-08-30 NOTE — Progress Notes (Signed)
Subjective:    Patient ID: [name], female    DOB: 11-01-1940, 72 y.o.   MRN: [id number]  HPI [name] presents for evaluation of continuing pain: she has pain at the left flank with radiation to legs and across the chest. She has complained for many months of paresthesia/neuropathic pain in the feet. She has had CXR that is normal. Reviewed all imaging: Had MRI lumbar spine '00 with large disc L5-S1 with free fragment - came to surgery; CT myelo gram - '11 with some mild nerve compression; MRI c-spine '00 no significant disc disease, MRI thoracic spine '08 w/o significant nerve compression. She has not had NVC. Current medication is gabapentin 300 mg qHS.  Past Medical History  Diagnosis Date  . Benign neoplasm of colon   . Osteoarthritis   . Hiatal hernia   . Ischemia   . Diarrhea   . Blurred vision   . Back pain   . Gastroenteritis   . Chronic maxillary sinusitis   . Depression   . Eustachian tube dysfunction   . Tardive dyskinesia   . Dyspnea   . Mitral valve prolapse   . Osteoarthritis   . GERD (gastroesophageal reflux disease)   . Allergic rhinitis   . Glaucoma(365)   . Hypothyroid   . Hypertension    Past Surgical History  Procedure Laterality Date  . Knee surgery      x 4  . 1 baker cyst removed    . Dilation and curettage of uterus      x3  . Lumbar spine surgery    . Abdominal hysterectomy    . Eye surgery    . Spine surgery     Family History  Problem Relation Age of Onset  . Cancer Mother     Breast Cancer with Metastatic disease  . Alzheimer's disease Father    History   Social History  . Marital Status: Widowed    Spouse Name: N/A    Number of Children: N/A  . Years of Education: N/A   Occupational History  . Not on file.   Social History Main Topics  . Smoking status: Never Smoker   . Smokeless tobacco: Not on file  . Alcohol Use: No  . Drug Use: No  . Sexually Active: Not on file   Other Topics Concern  . Not on file    Social History Narrative   [employer]   [employer]   Married: '59, '73 widowed; Married '77- 4 years, divorced   2 sons- '60, '61 : 1 granddaughter   Work: Hair Dresser   Lives alone   Patient has never smoked    Current Outpatient Prescriptions on File Prior to Visit  Medication Sig Dispense Refill  . acetaminophen (TYLENOL) 500 MG tablet Take 1 tablet (500 mg total) by mouth every 6 (six) hours as needed for pain.  30 tablet  0  . Artificial Tear Ointment (REFRESH LACRI-LUBE) OINT Place 1 application into both eyes daily.      Marland Kitchen aspirin 81 MG tablet Take 81 mg by mouth daily.        Marland Kitchen atenolol (TENORMIN) 50 MG tablet Take 25 mg by mouth daily.      . Calcium Carbonate-Vitamin D (CALCIUM 600+D) 600-200 MG-UNIT TABS Take 2 tablets by mouth daily.        . Calcium Citrate (CITRACAL PO) Take 3 tablets by mouth at bedtime.      . carboxymethylcellulose (REFRESH TEARS) 0.5 % SOLN  Place 1 drop into both eyes 2 (two) times daily.      . cholecalciferol (VITAMIN D) 1000 UNITS tablet Take 1,000 Units by mouth daily.        . diclofenac sodium (VOLTAREN) 1 % GEL Apply 2 g topically 4 (four) times daily.       [name] Calcium (STOOL SOFTENER PO) Take 2 tablets by mouth daily.      Marland Kitchen FLUoxetine (PROZAC) 20 MG capsule Take 1 capsule (20 mg total) by mouth daily.  90 capsule  3  . gabapentin (NEURONTIN) 300 MG capsule Take 1 capsule (300 mg total) by mouth at bedtime. May increase to twice a day if needed.  60 capsule  3  . hydrOXYzine (VISTARIL) 25 MG capsule Take 1 capsule (25 mg total) by mouth at bedtime.  30 capsule  1  . levothyroxine (SYNTHROID, LEVOTHROID) 175 MCG tablet Take 1 tablet (175 mcg total) by mouth daily.  90 tablet  1  . loratadine (ALAVERT) 10 MG tablet Take 10 mg by mouth daily.        Marland Kitchen LORazepam (ATIVAN) 0.5 MG tablet TAKE 1 TABLET UP TO 3 TIMES DAILY AS NEEDED  90 tablet  5  . lovastatin (MEVACOR) 40 MG tablet Take 1 tablet (40 mg total) by mouth at bedtime.  90 tablet  1   . meclizine (ANTIVERT) 25 MG tablet Take 12.5 mg by mouth every 6 (six) hours as needed. For dizziness      . Multiple Vitamins-Iron (MULTIVITAMINS WITH IRON) TABS Take 1 tablet by mouth daily.      . naproxen sodium (ALEVE) 220 MG tablet Take 1 tablet (220 mg total) by mouth 2 (two) times daily with a meal.      . omeprazole (PRILOSEC) 20 MG capsule Take 1 capsule (20 mg total) by mouth 2 (two) times daily.      . promethazine (PHENERGAN) 25 MG tablet Take 12.5 mg by mouth every 6 (six) hours as needed. For nausea      . topiramate (TOPAMAX) 200 MG tablet Take 1 tablet (200 mg total) by mouth 2 (two) times daily.  180 tablet  1  . vitamin C (ASCORBIC ACID) 500 MG tablet Take 1,000 mg by mouth daily.      . vitamin E 400 UNIT capsule Take 400 Units by mouth daily.         No current facility-administered medications on file prior to visit.      Review of Systems System review is negative for any constitutional, cardiac, pulmonary, GI or neuro symptoms or complaints other than as described in the HPI.     Objective:   Physical Exam Filed Vitals:   08/30/12 0852  BP: 110/70  Pulse: 78  Temp: 97 F (36.1 C)  Resp: 12   Wt Readings from Last 3 Encounters:  08/30/12 160 lb 6.4 oz (72.757 kg)  07/30/12 160 lb (72.576 kg)  06/06/12 160 lb 1.3 oz (72.612 kg)   gen'l- WNWD white woman in no acute distress HEENT - C&S clear, [place]/AT Cor - RRR, 2+ DP pulses. Pulm - Lungs CTAP Abd- BS+, diffuse non-specific tenderness, no HSM, no guarding or rebound. Back exam: normal stand; normal flex to greater than 100 degrees; normal gait; normal toe/heel walk; normal step up to exam table but needed 1+ assist due to weakness; normal SLR sitting and supine; normal DTRs at the patellar tendons; normal sensation to light touch, pin-prick and hyperesthesia deep vibratory stimulus distal foot; +  CVA tenderness Lower lumbar and sacral region.; able to move supine to sitting witout assistance.         Assessment & Plan:

## 2012-08-30 NOTE — Patient Instructions (Addendum)
1. On exam there is no finding to suggest a herniated disk or nerve compression of a major nature that would cause the pain in your feet or legs. Abdominal exam is normal. Neurologic exam shows some increased sensation to vibration in the feet. There is good circulation. MRI scanning is not indicated.   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 Plan Nerve conduction study to confirm diagnosis and tell at level the problem is, e.g at the foot or at the spinal cord  Increase gabapentin: go to 300 mg twice a day for 3 days and then 300 mg three times a day for 1 week.   If you still have unrelieved/unimproved leg and foot pain increase gabapentin to 600 mg (2x300mg ) three times a day.   Continue the Aleve twice a day  You may add tylenol 500 or 1,000 mg three times a day for pain relief.  2. Flank pain that radiates to front and to waist. - no evidence to suggest an acute disk rupture, thus no MRI is needed. No evidence of ulcer disease or any problem with the organs. Previous chest xray without any rib fracture. This pain, like the feet, may be nerve related and the above treatment may help.    Peripheral Nerve Problems Peripheral nerve disorders are problems with the nerves in your arms or legs. CAUSES  There are many different causes of these disorders. They include:  Injury.  Diabetes.  Chronic alcoholism.  Toxic chemicals and drugs.  Vitamin deficiencies.  Tumors.  Liver or kidney diseases. SYMPTOMS  Some of the problems caused are:  Tingling, burning, pain, and numbness in the extremities and feet.  Weakness, loss of muscle tone, and size. DIAGNOSIS  Sometimes blood tests and studies to examine nerve function are needed to make the diagnosis.  TREATMENT  Sometimes peripheral nerve problems can be treated with vitamins, medication, and avoiding known toxins such as alcohol. Please make a follow-up appointment to be sure you are getting  better with treatment.  SEEK MEDICAL CARE IF:   You are not better after one week of treatment.  You have worsening of problems or have breathing trouble. Document Released: 06/23/2004 Document Revised: 08/08/2011 Document Reviewed: 05/16/2005 [facility] Patient Information 2013 [place], [place].    Peripheral Nerve Problems Peripheral nerve disorders are problems with the nerves in your arms or legs. CAUSES  There are many different causes of these disorders. They include:  Injury.  Diabetes.  Chronic alcoholism.  Toxic chemicals and drugs.  Vitamin deficiencies.  Tumors.  Liver or kidney diseases. SYMPTOMS  Some of the problems caused are:  Tingling, burning, pain, and numbness in the extremities and feet.  Weakness, loss of muscle tone, and size. DIAGNOSIS  Sometimes blood tests and studies to examine nerve function are needed to make the diagnosis.  TREATMENT  Sometimes peripheral nerve problems can be treated with vitamins, medication, and avoiding known toxins such as alcohol. Please make a follow-up appointment to be sure you are getting better with treatment.  SEEK MEDICAL CARE IF:   You are not better after one week of treatment.  You have worsening of problems or have breathing trouble. Document Released: 06/23/2004 Document Revised: 08/08/2011 Document Reviewed: 05/16/2005 [facility] Patient Information 2013 [place], [place]. Neuropathic Pain We often think that pain has a physical cause. If we get rid of the cause, the pain should go away. However, nerves themselves can also cause pain. This pain often does not go away  easily. It is called neuropathic pain, which means nerve abnormality. It may be difficult to understand for the patients who have it and for the treating caregivers. Neuropathic pain may be caused by an injury or failure of the nervous system. The pain often results from an injury, but this injury may or may not be the cause of actual damage to the  nervous system. Nerves can be penetrated or squashed by tumors, strangled by scar tissue, or become inflamed due to infection. Neuropathic pain is often caused by nerve injury due to diabetes.Alcohol abuse may also lead to neuropathic pain. Neuropathic pain often seems to have no cause. The diagnosis is made when no other cause is found.  Pain may persist for months or years following the healing of damaged tissues. When this happens, pain signals no longer sound an alarm about current injuries or injuries about to happen. Instead, the alarm system itself is not working correctly.  CHARACTERISTICS OF NEUROPATHIC PAIN  Severe, sharp, electric shock-like, shooting, lightening-like, knife-like.  Pins and needles sensation.  Deep burning, deep cold, or deep ache.  Persistent numbness, tingling, or weakness.  Pain resulting from a light touch or other stimulus that would not usually cause pain.  Increased sensitivity to something that would normally cause pain, such as a pinprick. Neuropathic pain may get worse instead of better over time. For some people, it can lead to serious disability. It is important to be aware that severe injury in a limb can occur without a proper, protective pain response.Burns, cuts, and other injuries may go unnoticed. Without proper treatment, these injuries can become infected or lead to further disability. Take any injury seriously, and consult your caregiver for treatment. TREATMENT  Neuropathic pain is often long-lasting and tends not to get better when treated with opioids (narcotic types of pain medicine). It may respond well to other drugs such as antiseizure and antidepressant medicines. Usually, neuropathic pain does not completely go away, but partial improvement is often possible with proper treatment. Your caregivers, along with you, will select the medicines which best allow you to live a normal life. Do not be discouraged if you do not get immediate relief.  Sometimes different medicines or a combination of medicines will be tried before you receive the benefits you are hoping for. SEEK IMMEDIATE MEDICAL CARE IF:  There is a sudden change in the quality of your pain, especially if the change is on only one side of the body.  You notice changes of the skin, such as redness, black or purple discoloration, swelling, or an ulcer.  You cannot move the affected limbs. Document Released: 01/07/2004 Document Revised: 08/08/2011 Document Reviewed: 06/24/2010 [facility] Patient Information 2013 [place], [place].

## 2012-10-03 ENCOUNTER — Telehealth: Payer: Self-pay

## 2012-10-03 DIAGNOSIS — G609 Hereditary and idiopathic neuropathy, unspecified: Secondary | ICD-10-CM

## 2012-10-03 NOTE — Telephone Encounter (Signed)
LM for pt letting her know order was placed and she will be getting a phone call.

## 2012-10-03 NOTE — Telephone Encounter (Signed)
Order for nerve conduction study placed

## 2012-10-03 NOTE — Telephone Encounter (Signed)
Phone call from pt. She complains of feet pain. She is taking Gabapentin 300 mg BID. She is only taking an Aleve or Tylenol when it gets really bad. Dr Debby Bud was wanting pt to have a nerved conduction study done. I do not see the order in the system. Please advise.

## 2012-10-05 ENCOUNTER — Other Ambulatory Visit: Payer: Self-pay | Admitting: Internal Medicine

## 2012-10-05 ENCOUNTER — Other Ambulatory Visit: Payer: Self-pay

## 2012-10-05 MED ORDER — HYDROXYZINE PAMOATE 25 MG PO CAPS: 25.0000 mg | ORAL_CAPSULE | Freq: Every day | ORAL | Status: DC

## 2012-10-11 ENCOUNTER — Ambulatory Visit (INDEPENDENT_AMBULATORY_CARE_PROVIDER_SITE_OTHER): Payer: Medicare Other | Admitting: Internal Medicine

## 2012-10-11 ENCOUNTER — Other Ambulatory Visit: Payer: Self-pay | Admitting: Internal Medicine

## 2012-10-11 ENCOUNTER — Other Ambulatory Visit (INDEPENDENT_AMBULATORY_CARE_PROVIDER_SITE_OTHER): Payer: Medicare Other

## 2012-10-11 ENCOUNTER — Telehealth: Payer: Self-pay | Admitting: *Deleted

## 2012-10-11 ENCOUNTER — Encounter: Payer: Self-pay | Admitting: Internal Medicine

## 2012-10-11 ENCOUNTER — Ambulatory Visit (INDEPENDENT_AMBULATORY_CARE_PROVIDER_SITE_OTHER)
Admission: RE | Admit: 2012-10-11 | Discharge: 2012-10-11 | Disposition: A | Discharge status: Home / Self Care | Payer: Medicare Other | Source: Ambulatory Visit | Attending: Internal Medicine | Admitting: Internal Medicine

## 2012-10-11 VITALS — BP 128/78 | HR 90 | Temp 99.1°F | Ht 62.0 in | Wt 160.0 lb

## 2012-10-11 DIAGNOSIS — R509 Fever, unspecified: Secondary | ICD-10-CM | POA: Diagnosis not present

## 2012-10-11 DIAGNOSIS — W19XXXA Unspecified fall, initial encounter: Secondary | ICD-10-CM | POA: Diagnosis not present

## 2012-10-11 DIAGNOSIS — R0602 Shortness of breath: Secondary | ICD-10-CM | POA: Diagnosis not present

## 2012-10-11 DIAGNOSIS — J984 Other disorders of lung: Secondary | ICD-10-CM | POA: Diagnosis not present

## 2012-10-11 DIAGNOSIS — W19XXXD Unspecified fall, subsequent encounter: Secondary | ICD-10-CM

## 2012-10-11 LAB — CBC
HCT: 38.1 % (ref 36.0–46.0)
MCHC: 35 g/dL (ref 30.0–36.0)
MCV: 93.9 fl (ref 78.0–100.0)
RDW: 13.3 % (ref 11.5–14.6)

## 2012-10-11 LAB — BASIC METABOLIC PANEL
Calcium: 9 mg/dL (ref 8.4–10.5)
GFR: 68.72 mL/min (ref >60.00)
Potassium: 3.8 mEq/L (ref 3.5–5.1)
Sodium: 139 mEq/L (ref 135–145)

## 2012-10-11 MED ORDER — LEVOFLOXACIN 250 MG PO TABS: 250.0000 mg | ORAL_TABLET | Freq: Every day | ORAL | Status: DC

## 2012-10-11 NOTE — Progress Notes (Signed)
Subjective:    Patient ID: [name], female    DOB: [birth year], 72 y.o.   MRN: [id number]  HPI  Pt presents to the clinic today with c/o pain under her left breast in her rib area. This started 2 weeks ago. She has had 3 recent falls in the last 4 months. The pain is constant and worse when she takes a deep breath. She is concerned that she may have a rib fracture. She has taken Aleve for the pain without much relief. Additionally, she c/o fever, sore throat and some mild shortness of breath. This started 3 days ago. The worst part is the fatigue. She has not taken anything other than Aleve. She denies cough, chest pain or chest tightness. She does not smoke nor has she had sick contacts. She denies nausea, vomiting or diarrhea. She does have a history of allergies.  Review of Systems      Past Medical History  Diagnosis Date  . Benign neoplasm of colon   . Osteoarthritis   . Hiatal hernia   . Ischemia   . Diarrhea   . Blurred vision   . Back pain   . Gastroenteritis   . Chronic maxillary sinusitis   . Depression   . Eustachian tube dysfunction   . Tardive dyskinesia   . Dyspnea   . Mitral valve prolapse   . Osteoarthritis   . GERD (gastroesophageal reflux disease)   . Allergic rhinitis   . Glaucoma(365)   . Hypothyroid   . Hypertension     Current Outpatient Prescriptions  Medication Sig Dispense Refill  . acetaminophen (TYLENOL) 500 MG tablet Take 1 tablet (500 mg total) by mouth every 6 (six) hours as needed for pain.  30 tablet  0  . Artificial Tear Ointment (REFRESH LACRI-LUBE) OINT Place 1 application into both eyes daily.      Marland Kitchen aspirin 81 MG tablet Take 81 mg by mouth daily.        Marland Kitchen atenolol (TENORMIN) 50 MG tablet Take 25 mg by mouth daily.      . Calcium Carbonate-Vitamin D (CALCIUM 600+D) 600-200 MG-UNIT TABS Take 2 tablets by mouth daily.        . Calcium Citrate (CITRACAL PO) Take 3 tablets by mouth at bedtime.      . carboxymethylcellulose (REFRESH  TEARS) 0.5 % SOLN Place 1 drop into both eyes 2 (two) times daily.      . cholecalciferol (VITAMIN D) 1000 UNITS tablet Take 1,000 Units by mouth daily.        . diclofenac sodium (VOLTAREN) 1 % GEL Apply 2 g topically 4 (four) times daily.       [name] Calcium (STOOL SOFTENER PO) Take 2 tablets by mouth daily.      Marland Kitchen FLUoxetine (PROZAC) 20 MG capsule Take 1 capsule (20 mg total) by mouth daily.  90 capsule  3  . gabapentin (NEURONTIN) 300 MG capsule Take 1 capsule (300 mg total) by mouth 3 (three) times daily. May increase to twice a day if needed.  100 capsule  3  . hydrOXYzine (VISTARIL) 25 MG capsule Take 1 capsule (25 mg total) by mouth at bedtime.  30 capsule  1  . levothyroxine (SYNTHROID, LEVOTHROID) 175 MCG tablet Take 1 tablet (175 mcg total) by mouth daily.  90 tablet  1  . loratadine (ALAVERT) 10 MG tablet Take 10 mg by mouth daily.        Marland Kitchen LORazepam (ATIVAN) 0.5 MG tablet TAKE  1 TABLET UP TO 3 TIMES DAILY AS NEEDED  90 tablet  5  . lovastatin (MEVACOR) 40 MG tablet Take 1 tablet (40 mg total) by mouth at bedtime.  90 tablet  1  . meclizine (ANTIVERT) 25 MG tablet Take 12.5 mg by mouth every 6 (six) hours as needed. For dizziness      . meclizine (ANTIVERT) 25 MG tablet TAKE 1 TABLET EVERY 6 HOURS AS NEEDED FOR NAUSEA FOR DIZZINESS  100 tablet  1  . Multiple Vitamins-Iron (MULTIVITAMINS WITH IRON) TABS Take 1 tablet by mouth daily.      . naproxen sodium (ALEVE) 220 MG tablet Take 1 tablet (220 mg total) by mouth 2 (two) times daily with a meal.      . omeprazole (PRILOSEC) 20 MG capsule Take 1 capsule (20 mg total) by mouth 2 (two) times daily.      . promethazine (PHENERGAN) 25 MG tablet Take 12.5 mg by mouth every 6 (six) hours as needed. For nausea      . promethazine (PHENERGAN) 25 MG tablet TAKE 1 TABLET EVERY 6 HOURS AS NEEDED  30 tablet  5  . topiramate (TOPAMAX) 200 MG tablet Take 1 tablet (200 mg total) by mouth 2 (two) times daily.  180 tablet  1  . vitamin C (ASCORBIC  ACID) 500 MG tablet Take 1,000 mg by mouth daily.      . vitamin E 400 UNIT capsule Take 400 Units by mouth daily.         No current facility-administered medications for this visit.    Allergies  Allergen Reactions  . Benzocaine-Resorcinol   . Celecoxib Other (See Comments)    unknown  . Glucosamine   . Metoclopramide Hcl Other (See Comments)    shaking  . Niacin Other (See Comments)    shaking  . Phenazopyridine Hcl Other (See Comments)    unknown  . Sulfonamide Derivatives Nausea And Vomiting  . Trovan (Alatrofloxacin Mesylate)     Family History  Problem Relation Age of Onset  . Cancer Mother     Breast Cancer with Metastatic disease  . Alzheimer's disease Father     History   Social History  . Marital Status: Widowed    Spouse Name: N/A    Number of Children: N/A  . Years of Education: N/A   Occupational History  . Not on file.   Social History Main Topics  . Smoking status: Never Smoker   . Smokeless tobacco: Not on file  . Alcohol Use: No  . Drug Use: No  . Sexually Active: Not on file   Other Topics Concern  . Not on file   Social History Narrative   [employer]   [employer]   Married: '59, '73 widowed; Married '77- 4 years, divorced   2 sons- '60, '61 : 1 granddaughter   Work: Hair Dresser   Lives alone   Patient has never smoked     Constitutional: Pt reports fatigue. Denies fever, malaise, headache or abrupt weight changes.  HEENT: Denies eye pain, eye redness, ear pain, ringing in the ears, wax buildup, runny nose, nasal congestion, bloody nose, or sore throat. Respiratory: Pt reports mild shortness of breath. Denies difficulty breathing,  cough or sputum production.   Cardiovascular: Denies chest pain, chest tightness, palpitations or swelling in the hands or feet.  Gastrointestinal: Denies abdominal pain, bloating, constipation, diarrhea or blood in the stool.  GU: Denies urgency, frequency, pain with urination, burning sensation, blood in  urine, odor or discharge. Musculoskeletal: Pt reports pain in left ribs. Denies decrease in range of motion, difficulty with gait, muscle pain or joint pain and swelling.    No other specific complaints in a complete review of systems (except as listed in HPI above).  Objective:   Physical Exam   BP 128/78  Pulse 90  Temp(Src) 99.1 F (37.3 C) (Oral)  Ht 5\' 2"  (1.575 m)  Wt 160 lb (72.576 kg)  BMI 29.26 kg/m2  SpO2 95% Wt Readings from Last 3 Encounters:  10/11/12 160 lb (72.576 kg)  08/30/12 160 lb 6.4 oz (72.757 kg)  07/30/12 160 lb (72.576 kg)    General: Appears her stated age, well developed, well nourished in NAD. HEENT: Head: normal shape and size; Eyes: sclera white, no icterus, conjunctiva pink, PERRLA and EOMs intact; Ears: Tm's gray and intact, normal light reflex; Nose: mucosa pink and moist, septum midline; Throat/Mouth: Teeth present, mucosa pink and moist, no exudate, lesions or ulcerations noted.   Cardiovascular: Normal rate and rhythm. S1,S2 noted.  No murmur, rubs or gallops noted. No JVD or BLE edema. No carotid bruits noted. Pulmonary/Chest: Normal effort and positive vesicular breath sounds. No respiratory distress. No wheezes, rales or ronchi noted.  Abdomen: Soft and nontender. Normal bowel sounds, no bruits noted. No distention or masses noted. Liver, spleen and kidneys non palpable. Musculoskeletal: Pain with palpation of the left ribs. Normal range of motion. No signs of joint swelling. No difficulty with gait.        Assessment & Plan:   Left rib pain s/p fall:  Continue Aleve Will check chest xray to r/o fracture  Fever, shortness of breath:  Will check labs and chest xray to r/o infection and pna Continue Aleve for fever

## 2012-10-11 NOTE — Telephone Encounter (Signed)
Pt informed of results of chest xray from 5/15 and NP's advisement regarding rx for Levaquin.

## 2012-10-11 NOTE — Patient Instructions (Signed)

## 2012-10-12 ENCOUNTER — Other Ambulatory Visit: Payer: Self-pay

## 2012-10-12 ENCOUNTER — Other Ambulatory Visit: Payer: Self-pay | Admitting: Internal Medicine

## 2012-10-12 MED ORDER — FLUOXETINE HCL 20 MG PO CAPS: 20.0000 mg | ORAL_CAPSULE | Freq: Every day | ORAL | Status: DC

## 2012-10-12 NOTE — Telephone Encounter (Signed)
Don erx 

## 2012-10-12 NOTE — Telephone Encounter (Signed)
[facility] sends requests for refill, please advise.

## 2012-10-19 ENCOUNTER — Institutional Professional Consult (permissible substitution): Payer: Self-pay | Admitting: Neurology

## 2012-10-29 ENCOUNTER — Ambulatory Visit (INDEPENDENT_AMBULATORY_CARE_PROVIDER_SITE_OTHER): Payer: Medicare Other | Admitting: Neurology

## 2012-10-29 ENCOUNTER — Encounter: Payer: Self-pay | Admitting: Neurology

## 2012-10-29 VITALS — BP 129/74 | HR 72 | Ht 61.0 in | Wt 160.0 lb

## 2012-10-29 DIAGNOSIS — R209 Unspecified disturbances of skin sensation: Secondary | ICD-10-CM

## 2012-10-29 DIAGNOSIS — F329 Major depressive disorder, single episode, unspecified: Secondary | ICD-10-CM

## 2012-10-29 DIAGNOSIS — G609 Hereditary and idiopathic neuropathy, unspecified: Secondary | ICD-10-CM

## 2012-10-29 DIAGNOSIS — R202 Paresthesia of skin: Secondary | ICD-10-CM

## 2012-10-29 DIAGNOSIS — E039 Hypothyroidism, unspecified: Secondary | ICD-10-CM

## 2012-10-29 NOTE — Progress Notes (Signed)
History of present illness:  [name]  is a 72 years old right-handed Caucasian female, referred by her primary care physician Dr. Sharma Covert for evaluation of bilateral lower extremity paresthesia  She had past medical history of hypertension, hyperlipidemia, depression, is on polypharmacy treatment, including Prozac 20 mg every day, Ativan 3 times a day, Topamax 200 mg twice a day,  She presented with more than 10 years history of bilateral feet paresthesia, getting worse over the past 2 years, she described bilateral feet numbness tingling, going up to her legs, her feet felt like it was broken sometimes, her feet burns, ice pack helps some, but sometimes she put on socks, wrapping tightly around her socks, which seems to help her symptoms some, her feet pain getting worse after bear weight,  She also has a history of chronic low back pain, had a history of low back decompression surgery by Dr.  Ophelia Charter in 2004, she denies shooting pain to her neck, no bowel bladder incontinence, mild gait difficulty due to feet pain, no significant weakness  Most recent laboratory evaluation showed normal vitamin B12, TSH, CMP, elevated WBC 15.7  Review of Systems  Out of a complete 14 system review, the patient complains of only the following symptoms, and all other reviewed systems are negative.   Constitutional:   fever, chills, weight gain Cardiovascular:  Chest pain, swelling in her legs Ear/Nose/Throat:  N/A Skin: N/A Eyes: Blurred vision Respiratory: N/A Gastroitestinal: N/A    Hematology/Lymphatic:  N/A Endocrine:  N/A Musculoskeletal:N/A Allergy/Immunology: Allergy, running nose Neurological: Memory loss, dizziness, passing out. Psychiatric:    N/A  PHYSICAL EXAMINATOINS:  Generalized: In no acute distress  Neck: Supple, no carotid bruits   Cardiac: Regular rate rhythm  Pulmonary: Clear to auscultation bilaterally  Musculoskeletal: No deformity  Neurological examination  Mentation:  Alert oriented to time, place, history taking, and causual conversation  Cranial nerve II-XII: Pupils were equal round reactive to light extraocular movements were full, visual field were full on confrontational test. facial sensation and strength were normal. hearing was intact to finger rubbing bilaterally. Uvula tongue midline.  head turning and shoulder shrug and were normal and symmetric.Tongue protrusion into cheek strength was normal.  Motor: normal tone, bulk and strength.  Sensory: Mildly length dependent decreased fine touch, pinprick above ankle,  preserved vibratory sensation, and proprioception at toes.  Coordination: Normal finger to nose, heel-to-shin bilaterally there was no truncal ataxia  Gait: Rising up from seated position without assistance, normal stance, without trunk ataxia, moderate stride, good arm swing, smooth turning, able to perform tiptoe, and heel walking without difficulty.   Romberg signs: Negative  Deep tendon reflexes: Brachioradialis 2/2, biceps 2/2, triceps 2/2, patellar 2/2, Achilles 2/2, plantar responses were flexor bilaterally.   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 Laboratory evaluation for treatable cause

## 2012-10-30 LAB — ANA: Anti Nuclear Antibody(ANA): NEGATIVE

## 2012-10-30 LAB — C-REACTIVE PROTEIN: CRP: 11.3 mg/L — ABNORMAL HIGH (ref 0.0–4.9)

## 2012-10-30 LAB — CBC WITH DIFFERENTIAL
Basophils Absolute: 0 10*3/uL (ref 0.0–0.2)
Basos: 0 % (ref 0–3)
Eosinophils Absolute: 0.3 10*3/uL (ref 0.0–0.4)
Hemoglobin: 13.7 g/dL (ref 11.1–15.9)
Immature Grans (Abs): 0 10*3/uL (ref 0.0–0.1)
Immature Granulocytes: 0 % (ref 0–2)
Lymphs: 25 % (ref 14–46)
MCH: 32.1 pg (ref 26.6–33.0)
MCHC: 33.7 g/dL (ref 31.5–35.7)
Monocytes Absolute: 0.5 10*3/uL (ref 0.1–0.9)
Neutrophils Relative %: 65 % (ref 40–74)
Platelets: 163 10*3/uL (ref 155–379)

## 2012-10-30 LAB — RPR: RPR: NONREACTIVE

## 2012-11-06 ENCOUNTER — Encounter (INDEPENDENT_AMBULATORY_CARE_PROVIDER_SITE_OTHER): Payer: Medicare Other

## 2012-11-06 ENCOUNTER — Ambulatory Visit (INDEPENDENT_AMBULATORY_CARE_PROVIDER_SITE_OTHER): Payer: Medicare Other | Admitting: Neurology

## 2012-11-06 DIAGNOSIS — R202 Paresthesia of skin: Secondary | ICD-10-CM

## 2012-11-06 DIAGNOSIS — Z0289 Encounter for other administrative examinations: Secondary | ICD-10-CM

## 2012-11-06 DIAGNOSIS — R209 Unspecified disturbances of skin sensation: Secondary | ICD-10-CM | POA: Diagnosis not present

## 2012-11-06 DIAGNOSIS — F329 Major depressive disorder, single episode, unspecified: Secondary | ICD-10-CM

## 2012-11-06 DIAGNOSIS — G609 Hereditary and idiopathic neuropathy, unspecified: Secondary | ICD-10-CM

## 2012-11-06 DIAGNOSIS — E039 Hypothyroidism, unspecified: Secondary | ICD-10-CM

## 2012-11-08 ENCOUNTER — Telehealth: Payer: Self-pay | Admitting: Neurology

## 2012-11-08 NOTE — Procedures (Signed)
History of present illness: 72 years old Caucasian female, presenting with many years history of bilateral feet paresthesia,  On examination:  Bilateral upper and lower extremity motor strength was normal, mildly length dependent sensory changes,   Nerve conduction study: Bilateral peroneal sensory responses were normal. Bilateral peroneal to EDB, and tibial motor responses were normal. Bilateral tibial H. reflexes were normal and symmetric.  Electromyography:  Selected needle examination was performed at right lower extremity muscles, and right lumbosacral paraspinal muscles.  Needle examination of right tibialis anterior, tibialis posterior, medial gastrocnemius, peroneal longus, vastus lateralis, biceps femoris short head was normal.  There was no spontaneous activity at right lumbosacral paraspinal muscles, right L4, 5, S1.   IN CONCLUSION: This is a normal study. There is no electrodiagnostic evidence of large fiber peripheral neuropathy, right lower extremity neuropathy, or right lumbosacral radiculopathy.

## 2012-11-08 NOTE — Telephone Encounter (Signed)
Pharmacy faxed [place] a form, which has been completed, pending MD signature, then will fax back to verify info requested.

## 2012-11-12 ENCOUNTER — Telehealth: Payer: Self-pay | Admitting: Neurology

## 2012-11-16 NOTE — Telephone Encounter (Signed)
I called pt and let her know that we spoke to [facility] and change in formulation.  Will LM with Shanda Bumps to call them on Monday.

## 2012-11-16 NOTE — Telephone Encounter (Signed)
I called back.  Spoke with Ladene Artist, he could not assist me.  He transferred me to [facility], but she was gone for the day.  I then spoke with Tresa Endo.  She said they will omit the Meloxicam and proceed with order.  They will contact patient to arrange delivery.

## 2012-11-16 NOTE — Telephone Encounter (Signed)
Please call them back, please skip Meloxicam in the ingredient.

## 2012-11-16 NOTE — Telephone Encounter (Signed)
I just spoke with Melissa at [facility].  She said they note the patient has an allergy to Sulfa and Celebrex.  The compound prescribed has Mexloicam in it. They want to know if they need to omit this ingredient, or keep it in the compound.  Please advise.  Thank you.

## 2012-11-25 ENCOUNTER — Other Ambulatory Visit: Payer: Self-pay | Admitting: Internal Medicine

## 2012-12-04 ENCOUNTER — Other Ambulatory Visit: Payer: Self-pay | Admitting: Internal Medicine

## 2012-12-04 DIAGNOSIS — H353190 Nonexudative age-related macular degeneration, unspecified eye, stage unspecified: Secondary | ICD-10-CM | POA: Diagnosis not present

## 2012-12-04 DIAGNOSIS — H02409 Unspecified ptosis of unspecified eyelid: Secondary | ICD-10-CM | POA: Diagnosis not present

## 2012-12-04 DIAGNOSIS — H2510 Age-related nuclear cataract, unspecified eye: Secondary | ICD-10-CM | POA: Diagnosis not present

## 2012-12-04 DIAGNOSIS — H40009 Preglaucoma, unspecified, unspecified eye: Secondary | ICD-10-CM | POA: Diagnosis not present

## 2012-12-04 DIAGNOSIS — H02839 Dermatochalasis of unspecified eye, unspecified eyelid: Secondary | ICD-10-CM | POA: Diagnosis not present

## 2012-12-04 DIAGNOSIS — H01009 Unspecified blepharitis unspecified eye, unspecified eyelid: Secondary | ICD-10-CM | POA: Diagnosis not present

## 2012-12-04 DIAGNOSIS — Z961 Presence of intraocular lens: Secondary | ICD-10-CM | POA: Diagnosis not present

## 2012-12-04 NOTE — Telephone Encounter (Signed)
Lorazepam called to pharmacy  

## 2012-12-18 ENCOUNTER — Telehealth: Payer: Self-pay | Admitting: Neurology

## 2012-12-18 MED ORDER — GABAPENTIN 300 MG PO CAPS: 300.0000 mg | ORAL_CAPSULE | Freq: Three times a day (TID) | ORAL | Status: DC

## 2012-12-18 NOTE — Telephone Encounter (Signed)
I called and spoke with patient and she stated that the second patch of cream isn't working. Patient stated for the past three-four days she has been applying the cream to her feet but she isn't getting any relief, and she has tried hot baths and taping her feet but no relief. Patient said that towards the end of day she is having swelling in her feet along with the pain. Patient would like to speak with physician because she doesn't understand why the first patch of cream work and the second patch isn't working.

## 2012-12-18 NOTE — Telephone Encounter (Signed)
i have called her, she complains of feet like hot water running through it, she is taking gabapentin 300mg  one capsule bid, it does not cause significant side effect, I have suggested her to take gabapentin 300mg  3 tabs during the day, 2 tabs qhs. New Rx is called in.

## 2013-01-01 DIAGNOSIS — Z9849 Cataract extraction status, unspecified eye: Secondary | ICD-10-CM | POA: Diagnosis not present

## 2013-01-01 DIAGNOSIS — H40009 Preglaucoma, unspecified, unspecified eye: Secondary | ICD-10-CM | POA: Diagnosis not present

## 2013-01-01 DIAGNOSIS — H02839 Dermatochalasis of unspecified eye, unspecified eyelid: Secondary | ICD-10-CM | POA: Diagnosis not present

## 2013-01-01 DIAGNOSIS — H2510 Age-related nuclear cataract, unspecified eye: Secondary | ICD-10-CM | POA: Diagnosis not present

## 2013-01-01 DIAGNOSIS — H01009 Unspecified blepharitis unspecified eye, unspecified eyelid: Secondary | ICD-10-CM | POA: Diagnosis not present

## 2013-01-03 ENCOUNTER — Ambulatory Visit: Payer: Medicare Other | Admitting: Internal Medicine

## 2013-01-03 ENCOUNTER — Ambulatory Visit (INDEPENDENT_AMBULATORY_CARE_PROVIDER_SITE_OTHER): Payer: Medicare Other | Admitting: Internal Medicine

## 2013-01-03 ENCOUNTER — Encounter: Payer: Self-pay | Admitting: Internal Medicine

## 2013-01-03 ENCOUNTER — Telehealth: Payer: Self-pay | Admitting: Internal Medicine

## 2013-01-03 VITALS — BP 128/74 | HR 86 | Temp 99.0°F | Wt 163.0 lb

## 2013-01-03 DIAGNOSIS — R071 Chest pain on breathing: Secondary | ICD-10-CM

## 2013-01-03 DIAGNOSIS — M2669 Other specified disorders of temporomandibular joint: Secondary | ICD-10-CM

## 2013-01-03 DIAGNOSIS — R0789 Other chest pain: Secondary | ICD-10-CM

## 2013-01-03 DIAGNOSIS — M26629 Arthralgia of temporomandibular joint, unspecified side: Secondary | ICD-10-CM

## 2013-01-03 NOTE — Patient Instructions (Addendum)
1. Chest wall pain with tenderness between the ribs which can radiate all the way around from the spine to the center of the chest. No rash. NOT CONTAGIOUS  Plan Heat, tylenol, gental stretching  2. 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 Tender with jaw movement  PLan  small bites  Aspercreme over the outside of the joint  Your regular medications should help  May add over the counter aleve as needed   Chest Wall Pain Chest wall pain is pain in or around the bones and muscles of your chest. It may take up to 6 weeks to get better. It may take longer if you must stay physically active in your work and activities.  CAUSES  Chest wall pain may happen on its own. However, it may be caused by:  A viral illness like the flu.  Injury.  Coughing.  Exercise.  Arthritis.  Fibromyalgia.  Shingles. HOME CARE INSTRUCTIONS   Avoid overtiring physical activity. Try not to strain or perform activities that cause pain. This includes any activities using your chest or your abdominal and side muscles, especially if heavy weights are used.  Put ice on the sore area.  Put ice in a plastic bag.  Place a towel between your skin and the bag.  Leave the ice on for 15-20 minutes per hour while awake for the first 2 days.  Only take over-the-counter or prescription medicines for pain, discomfort, or fever as directed by your caregiver. SEEK IMMEDIATE MEDICAL CARE IF:   Your pain increases, or you are very uncomfortable.  You have a fever.  Your chest pain becomes worse.  You have new, unexplained symptoms.  You have nausea or vomiting.  You feel sweaty or lightheaded.  You have a cough with phlegm (sputum), or you cough up blood. MAKE SURE YOU:   Understand these instructions.  Will watch your condition.  Will get help right away if you are not doing well or get worse. Document Released: 05/16/2005 Document Revised: 08/08/2011 Document Reviewed: 01/10/2011 [facility] Patient  Information 2014 [place], [place].     Temporomandibular Problems  Temporomandibular joint (TMJ) dysfunction means there are problems with the joint between your jaw and your skull. This is a joint lined by cartilage like other joints in your body but also has a small disc in the joint which keeps the bones from rubbing on each other. These joints are like other joints and can get inflamed (sore) from arthritis and other problems. When this joint gets sore, it can cause headaches and pain in the jaw and the face. CAUSES  Usually the arthritic types of problems are caused by soreness in the joint. Soreness in the joint can also be caused by overuse. This may come from grinding your teeth. It may also come from mis-alignment in the joint. DIAGNOSIS Diagnosis of this condition can often be made by history and exam. Sometimes your caregiver may need X-rays or an MRI scan to determine the exact cause. It may be necessary to see your dentist to determine if your teeth and jaws are lined up correctly. TREATMENT  Most of the time this problem is not serious; however, sometimes it can persist (become chronic). When this happens medications that will cut down on inflammation (soreness) help. Sometimes a shot of cortisone into the joint will be helpful. If your teeth are not aligned it may help for your dentist to make a splint for your mouth that can help this problem. If no physical problems can  be found, the problem may come from tension. If tension is found to be the cause, biofeedback or relaxation techniques may be helpful. HOME CARE INSTRUCTIONS   Later in the day, applications of ice packs may be helpful. Ice can be used in a plastic bag with a towel around it to prevent frostbite to skin. This may be used about every 2 hours for 20 to 30 minutes, as needed while awake, or as directed by your caregiver.  Only take over-the-counter or prescription medicines for pain, discomfort, or fever as directed by  your caregiver.  If physical therapy was prescribed, follow your caregiver's directions.  Wear mouth appliances as directed if they were given. Document Released: 02/08/2001 Document Revised: 08/08/2011 Document Reviewed: 05/18/2008 [facility] Patient Information 2014 [place], [place].

## 2013-01-03 NOTE — Telephone Encounter (Signed)
Patient called to reschedule her 11am appt.  States that she is being seen for ear pain ? Shingles per Dr. Azucena Cecil.   Patient woke up at 3am with severe diarrhea.  She has taken medication and wants a later appt today.  Same rescheduled at her request for 3p.

## 2013-01-03 NOTE — Progress Notes (Signed)
Subjective:    Patient ID: [name], female    DOB: Mar 31, 1941, 72 y.o.   MRN: [id number]  HPI [name] presents for follow up. Since her last visit she has seen Dr. Terrace Arabia at [facility] - NCS negative for evidence of small or large fiber neuropathy. She continues to have pain in her feet and is scheduled to see Dr. Terrace Arabia in the near future.  Today she is c/o pain from the left flank around to the abdomen and upper chest, under the breast. Duration of several weeks, constant in nature, can interfere with sleep. APAP helps. No rash. No report or recollection of any injury. She feels better laying down.   Pain in the posterior cervical  Region right to parietal region to around the ear. Duration several days. Ashby Dawes is constant, rated as 6/10.Chewing makes it worse.   She had an episode of lower abdominal pain and at 3 AM she had diarrhea - 2 loose BMs. She has taken immodium x 2 which stopped the loose stool.  Past Medical History  Diagnosis Date  . Benign neoplasm of colon   . Osteoarthritis   . Hiatal hernia   . Diarrhea   . Blurred vision   . Back pain   . Gastroenteritis   . Chronic maxillary sinusitis   . Depression   . Eustachian tube dysfunction   . Tardive dyskinesia   . Dyspnea   . Mitral valve prolapse   . Osteoarthritis   . GERD (gastroesophageal reflux disease)   . Allergic rhinitis   . Glaucoma   . Hypothyroid   . Hypertension    Past Surgical History  Procedure Laterality Date  . Knee surgery      x 4  . 1 baker cyst removed    . Dilation and curettage of uterus      x3  . Lumbar spine surgery    . Abdominal hysterectomy    . Eye surgery    . Lumbar spine surgery     Family History  Problem Relation Age of Onset  . Cancer Mother     Breast Cancer with Metastatic disease  . Alzheimer's disease Father    History   Social History  . Marital Status: Widowed    Spouse Name: N/A    Number of Children: 2  . Years of Education: N/A   Occupational History   .     Social History Main Topics  . Smoking status: Never Smoker   . Smokeless tobacco: Not on file  . Alcohol Use: No  . Drug Use: No  . Sexually Active: Not on file   Other Topics Concern  . 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 acetaminophen (TYLENOL) 500 MG tablet Take 1 tablet (500 mg total) by mouth every 6 (six) hours as needed for pain.  30 tablet  0  . Artificial Tear Ointment (REFRESH LACRI-LUBE) OINT Place 1 application into both eyes daily.      Marland Kitchen aspirin 81 MG tablet Take 81 mg by mouth daily.        Marland Kitchen  atenolol (TENORMIN) 50 MG tablet Take 25 mg by mouth daily.      . Calcium Carbonate-Vitamin D (CALCIUM 600+D) 600-200 MG-UNIT TABS Take 2 tablets by mouth daily.        . Calcium Citrate (CITRACAL PO) Take 3 tablets by mouth at bedtime.      . carboxymethylcellulose (REFRESH TEARS) 0.5 % SOLN Place 1 drop into both eyes 2 (two) times daily.      . cholecalciferol (VITAMIN D) 1000 UNITS tablet Take 1,000 Units by mouth daily.        [name] Calcium (STOOL SOFTENER PO) Take 2 tablets by mouth daily.      Marland Kitchen FLUoxetine (PROZAC) 20 MG capsule Take 1 capsule (20 mg total) by mouth daily.  90 capsule  3  . gabapentin (NEURONTIN) 300 MG capsule Take 1 capsule (300 mg total) by mouth 3 (three) times daily. 2 tabs qhs  150 capsule  12  . hydrOXYzine (VISTARIL) 25 MG capsule Take 1 capsule (25 mg total) by mouth at bedtime.  30 capsule  1  . levothyroxine (SYNTHROID, LEVOTHROID) 175 MCG tablet Take 1 tablet by mouth  daily  90 tablet  0  . loratadine (ALAVERT) 10 MG tablet Take 10 mg by mouth daily.        Marland Kitchen LORazepam (ATIVAN) 0.5 MG tablet TAKE 1 TABLET UP TO  3 TIMES A DAY AS NEEDED.  90 tablet  5  . lovastatin (MEVACOR) 40 MG tablet Take 1 tablet by mouth at  bedtime  90 tablet  0  . meclizine (ANTIVERT) 25 MG tablet TAKE 1 TABLET BY MOUTH EVERY 6 HOURS AS NEEDED FOR NAUSEA OR DIZZINESS  100 tablet  1  . Multiple Vitamins-Iron (MULTIVITAMINS WITH IRON) TABS Take 1 tablet by mouth daily.      . Multiple Vitamins-Minerals (ICAPS) CAPS Take by mouth daily.      . naproxen sodium (ANAPROX) 220 MG tablet Take 220 mg by mouth as needed.      Marland Kitchen omeprazole (PRILOSEC) 20 MG capsule Take 1 capsule (20 mg total) by mouth 2 (two) times daily.      . promethazine (PHENERGAN) 25 MG tablet TAKE 1 TABLET EVERY 6 HOURS AS NEEDED  30 tablet  5  . topiramate (TOPAMAX) 200 MG tablet Take 1 tablet by mouth  twice a day  180 tablet  0  . vitamin C (ASCORBIC ACID) 500 MG tablet Take 1,000 mg by mouth daily.      . vitamin E 400 UNIT capsule Take 400 Units by mouth daily.         No current facility-administered medications on file prior to visit.      Review of Systems System review is negative for any constitutional, cardiac, pulmonary, GI or neuro symptoms or complaints other than as described in the HPI.     Objective:   Physical Exam Filed Vitals:   01/03/13 1526  BP: 128/74  Pulse: 86  Temp: 99 F (37.2 C)   Wt Readings from Last 3 Encounters:  01/03/13 163 lb (73.936 kg)  10/29/12 160 lb (72.576 kg)  10/11/12 160 lb (72.576 kg)   Gen'l Elderly white woman in no distress HEENT- very tender to palpation over the fright TMJ, tender with jaw movement.  Cor- RRR Pulm - normal Flank - no rash on the left flank. Tender to palpation in the intercostal space.       Assessment & Plan:  1. Chest wall pain with tenderness between the  ribs which can radiate all the way around from the spine to the center of the chest. No rash. NOT CONTAGIOUS  Plan Heat, tylenol, gental stretching  2. 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 Tender with jaw  movement  PLan  small bites  Aspercreme over the outside of the joint  Your regular medications should help  May add over the counter aleve as needed.

## 2013-01-10 ENCOUNTER — Other Ambulatory Visit: Payer: Self-pay | Admitting: Internal Medicine

## 2013-01-17 ENCOUNTER — Encounter: Payer: Self-pay | Admitting: Neurology

## 2013-01-17 ENCOUNTER — Ambulatory Visit (INDEPENDENT_AMBULATORY_CARE_PROVIDER_SITE_OTHER): Payer: Medicare Other | Admitting: Neurology

## 2013-01-17 VITALS — BP 128/75 | HR 81 | Ht 61.0 in | Wt 161.0 lb

## 2013-01-17 DIAGNOSIS — F3289 Other specified depressive episodes: Secondary | ICD-10-CM

## 2013-01-17 DIAGNOSIS — E785 Hyperlipidemia, unspecified: Secondary | ICD-10-CM | POA: Diagnosis not present

## 2013-01-17 DIAGNOSIS — R202 Paresthesia of skin: Secondary | ICD-10-CM

## 2013-01-17 DIAGNOSIS — F329 Major depressive disorder, single episode, unspecified: Secondary | ICD-10-CM

## 2013-01-17 DIAGNOSIS — E039 Hypothyroidism, unspecified: Secondary | ICD-10-CM

## 2013-01-17 DIAGNOSIS — R209 Unspecified disturbances of skin sensation: Secondary | ICD-10-CM

## 2013-01-17 DIAGNOSIS — F411 Generalized anxiety disorder: Secondary | ICD-10-CM

## 2013-01-17 DIAGNOSIS — G609 Hereditary and idiopathic neuropathy, unspecified: Secondary | ICD-10-CM

## 2013-01-17 DIAGNOSIS — I10 Essential (primary) hypertension: Secondary | ICD-10-CM

## 2013-01-17 MED ORDER — DESIPRAMINE HCL 25 MG PO TABS: 50.0000 mg | ORAL_TABLET | Freq: Every day | ORAL | Status: DC

## 2013-01-17 NOTE — Progress Notes (Signed)
History of present illness:  [name]  is a 72 years old right-handed Caucasian female, referred by her primary care physician Dr. Sharma Covert for evaluation of bilateral lower extremity paresthesia  She had past medical history of hypertension, hyperlipidemia, depression, is on polypharmacy treatment, including Prozac 20 mg every day, Ativan 3 times a day, Topamax 200 mg twice a day,  She presented with more than 10 years history of bilateral feet paresthesia, getting worse over the past 2 years, she described bilateral feet numbness tingling, going up to her legs, her feet felt like it was broken sometimes, her feet burns, ice pack helps some, but sometimes she put on socks, wrapping tightly around her socks, which seems to help her symptoms some, her feet pain getting worse after bear weight,  She also has a history of chronic low back pain, had a history of low back decompression surgery by Dr.  Ophelia Charter in 2004, she denies shooting pain to her neck, no bowel bladder incontinence, mild gait difficulty due to feet pain, no significant weakness  Most recent laboratory evaluation showed normal vitamin B12, TSH, CMP, elevated WBC 15.7  UPDATE January 17 2013:  Laboratory evaluations was normal including CBC, RPR, ANA, vitamin B12, TSH, mild elevated C. reactive protein of 11.2,  She has tried compounding cream, which has made her skin peel, she is taking gabapentin 300 mg 3 times a day, which helped her 60%, also make her tired, the most bothersome symptoms at nighttime, she complains of bilateral plantar feet burning pain, she has to wrap her toes together, soak it in hot water, which only helped her temporarily.  She has mild gait difficulty due to bilateral feet pain, she denies bowel and bladder incontinence. She denies significant low back pain,   Review of Systems  Out of a complete 14 system review, the patient complains of only the following symptoms, and all other reviewed systems are negative.    Constitutional:   fever, chills, weight gain Cardiovascular:  Chest pain, swelling in her legs Ear/Nose/Throat:  N/A Skin: N/A Eyes: Blurred vision Respiratory: N/A Gastroitestinal: N/A    Hematology/Lymphatic:  N/A Endocrine:  N/A Musculoskeletal:N/A Allergy/Immunology: Allergy, running nose Neurological: Memory loss, dizziness, passing out. Psychiatric:    N/A  PHYSICAL EXAMINATOINS:  Generalized: In no acute distress  Neck: Supple, no carotid bruits   Cardiac: Regular rate rhythm  Pulmonary: Clear to auscultation bilaterally  Musculoskeletal: No deformity  Neurological examination  Mentation: Alert oriented to time, place, history taking, and causual conversation  Cranial nerve II-XII: Pupils were equal round reactive to light extraocular movements were full, visual field were full on confrontational test. facial sensation and strength were normal. hearing was intact to finger rubbing bilaterally. Uvula tongue midline.  head turning and shoulder shrug and were normal and symmetric.Tongue protrusion into cheek strength was normal.  Motor: normal tone, bulk and strength.  Sensory: Mildly length dependent decreased fine touch, pinprick  to mid shin level, ,  preserved vibratory sensation at toes, lasting for 2-3 second , and proprioception at toes.  Coordination: Normal finger to nose, heel-to-shin bilaterally there was no truncal ataxia  Gait: Rising up from seated position without assistance, normal stance, without trunk ataxia, moderate stride, good arm swing, smooth turning, able to perform tiptoe, and heel walking without difficulty.   Romberg signs: Negative  Deep tendon reflexes: Brachioradialis 2/2, biceps 2/2, triceps 2/2, patellar 2/2, Achilles trace. plantar responses were flexor bilaterally.   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 Keep gabapentin 300mg  tid. 3. Desipramine 25mg  2 tabs qhs. 4. RTC in 4-6 months with Eber Jones

## 2013-01-18 ENCOUNTER — Ambulatory Visit (INDEPENDENT_AMBULATORY_CARE_PROVIDER_SITE_OTHER): Payer: Medicare Other | Admitting: Internal Medicine

## 2013-01-18 ENCOUNTER — Encounter: Payer: Self-pay | Admitting: Internal Medicine

## 2013-01-18 VITALS — BP 120/82 | HR 78 | Temp 98.0°F | Wt 162.8 lb

## 2013-01-18 DIAGNOSIS — N390 Urinary tract infection, site not specified: Secondary | ICD-10-CM

## 2013-01-18 DIAGNOSIS — G609 Hereditary and idiopathic neuropathy, unspecified: Secondary | ICD-10-CM | POA: Diagnosis not present

## 2013-01-18 LAB — POCT URINALYSIS DIPSTICK
Bilirubin, UA: NEGATIVE
Nitrite, UA: NEGATIVE
pH, UA: 5

## 2013-01-18 MED ORDER — AMPICILLIN 250 MG PO CAPS: 250.0000 mg | ORAL_CAPSULE | Freq: Four times a day (QID) | ORAL | Status: AC

## 2013-01-18 NOTE — Progress Notes (Signed)
HPI: complains of UTI symptoms Onset 3 days ago, progressively worse associated with dysuria and small volume voiding with increased frequency denies hematuria, flank pain or fever The patient has a history of prior UTI  PMH: reviewed  ROS:  Gen.: No unexpected weight change, no night sweats Lungs: No cough or shortness of breath Cardiovascular: No palpitations or chest pain  PE: BP 120/82  Pulse 78  Temp(Src) 98 F (36.7 C) (Oral)  Wt 162 lb 12.8 oz (73.846 kg)  BMI 30.78 kg/m2  SpO2 97% General: No acute distress Lungs: Clear to auscultation Cardiovascular: Regular rate rhythm, no edema Abdomen: Mild to moderate discomfort of her suprapubic region, no flank tenderness to palpation  Lab Results  Component Value Date   WBC 8.1 10/29/2012   HGB 13.7 10/29/2012   HCT 40.7 10/29/2012   PLT 163 10/29/2012   GLUCOSE 98 10/11/2012   CHOL 189 12/20/2006   TRIG 281* 12/20/2006   HDL 43.3 12/20/2006   LDLDIRECT 113.1 12/20/2006   ALT 27 07/08/2009   AST 27 07/08/2009   NA 139 10/11/2012   K 3.8 10/11/2012   CL 108 10/11/2012   CREATININE 0.9 10/11/2012   BUN 11 10/11/2012   CO2 23 10/11/2012   TSH 1.52 06/06/2012   INR 1.00 07/08/2009    Assessment/Plan: UTI, classic symptoms with history of same  Empiric antibiotic x7 days Urine culture for identification and sensitivities Hydration recommended education provided

## 2013-01-18 NOTE — Assessment & Plan Note (Signed)
Working with neuro on same titrating gabapentin as tol (limited by side effects) To begin TCA - reviewed same with pt today in depth No new changes recommended for mgmt of same

## 2013-01-18 NOTE — Patient Instructions (Signed)
It was good to see you today. We have reviewed your prior records including labs and tests today Medications reviewed and updated,  Ampicillin antibiotics for urinary tract infection - Your prescription(s) have been submitted to your pharmacy. Please take as directed and contact our office if you believe you are having problem(s) with the medication(s). No other changes recommended at this time. Test(s) ordered today. Your results will be released to MyChart (or called to you) after review, usually within 72hours after test completion. If any changes need to be made, you will be notified at that same time.

## 2013-01-30 ENCOUNTER — Other Ambulatory Visit: Payer: Self-pay

## 2013-01-30 MED ORDER — DESIPRAMINE HCL 25 MG PO TABS: ORAL_TABLET | ORAL | Status: DC

## 2013-02-13 ENCOUNTER — Ambulatory Visit: Payer: Medicare Other | Admitting: Internal Medicine

## 2013-02-15 ENCOUNTER — Telehealth: Payer: Self-pay | Admitting: Internal Medicine

## 2013-02-15 MED ORDER — HYDROXYZINE PAMOATE 25 MG PO CAPS: 25.0000 mg | ORAL_CAPSULE | Freq: Every day | ORAL | Status: DC

## 2013-02-15 NOTE — Telephone Encounter (Signed)
Done.   This should have gone to a doc in the office to be refilled before the weekend.

## 2013-02-15 NOTE — Telephone Encounter (Signed)
Pt request refill for Hydroxyzine 25 mg. Pt stated that [facility] requested 3 days ago and never heard anything from our office. Pt stated that she is out of this med and would like for it to be done ASAP. Please advise.

## 2013-02-21 ENCOUNTER — Other Ambulatory Visit: Payer: Self-pay

## 2013-02-21 MED ORDER — HYDROXYZINE PAMOATE 25 MG PO CAPS: 25.0000 mg | ORAL_CAPSULE | Freq: Every day | ORAL | Status: DC

## 2013-02-25 ENCOUNTER — Telehealth: Payer: Self-pay | Admitting: Neurology

## 2013-02-28 NOTE — Telephone Encounter (Signed)
I spoke to patient and she does not know why she was prescribed the Desipramine.  She is afraid to take it and said she already takes a lot of medicines at night.  She would like to speak to doctor about it.  [phone]

## 2013-03-01 NOTE — Telephone Encounter (Signed)
I have called her, explained that desipramine 25mg  2 tabs qhs is for her peripheral neuropathy, nocturnal paresthesia.  She is already taking hydroxyzine, 25mg  qhs, and gabapentin 300mg  tid,

## 2013-04-04 ENCOUNTER — Other Ambulatory Visit: Payer: Self-pay

## 2013-04-17 ENCOUNTER — Encounter: Payer: Self-pay | Admitting: Internal Medicine

## 2013-04-17 ENCOUNTER — Ambulatory Visit (INDEPENDENT_AMBULATORY_CARE_PROVIDER_SITE_OTHER): Payer: Medicare Other | Admitting: Internal Medicine

## 2013-04-17 VITALS — BP 152/90 | HR 77 | Temp 98.3°F | Wt 164.0 lb

## 2013-04-17 DIAGNOSIS — R040 Epistaxis: Secondary | ICD-10-CM

## 2013-04-17 NOTE — Progress Notes (Signed)
Subjective:    Patient ID: [name], female    DOB: December 26, 1940, 72 y.o.   MRN: [id number]  HPI [name] presents for recurrent epistaxis: she will have a nosebleed every 2-3 days from left nostril. This has been going on for about a month. She has no precipitating injury or event. The bleeding will stop spontaneously.  She has also had a very dry and scratch left throat. She thinks she is a nose breather at night.   Past Medical History  Diagnosis Date  . Benign neoplasm of colon   . Osteoarthritis   . Hiatal hernia   . Diarrhea   . Back pain   . Chronic maxillary sinusitis   . Depression   . Eustachian tube dysfunction   . Tardive dyskinesia   . Mitral valve prolapse   . Osteoarthritis   . GERD (gastroesophageal reflux disease)   . Allergic rhinitis   . Glaucoma   . Hypothyroid   . Hypertension    Past Surgical History  Procedure Laterality Date  . Knee surgery      x 4  . 1 baker cyst removed    . Dilation and curettage of uterus      x3  . Lumbar spine surgery    . Abdominal hysterectomy    . Eye surgery    . Lumbar spine surgery     Family History  Problem Relation Age of Onset  . Cancer Mother     Breast Cancer with Metastatic disease  . Alzheimer's disease Father    History   Social History  . Marital Status: Widowed    Spouse Name: N/A    Number of Children: 2  . Years of Education: N/A   Occupational History  .     Social History Main Topics  . Smoking status: Never Smoker   . Smokeless tobacco: Not on file  . Alcohol Use: No  . Drug Use: No  . Sexual Activity: Not on file   Other Topics Concern  . 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 acetaminophen (TYLENOL) 500 MG tablet Take 1 tablet (500 mg total) by mouth every 6 (six) hours as needed for pain.  30 tablet  0  . Artificial Tear Ointment (REFRESH LACRI-LUBE) OINT Place 1 application into both eyes daily.      Marland Kitchen aspirin 81 MG tablet Take 81 mg by mouth daily.        Marland Kitchen atenolol (TENORMIN) 50 MG tablet Take 25 mg by mouth daily.      . Calcium Carbonate-Vitamin D (CALCIUM 600+D) 600-200 MG-UNIT TABS Take 2 tablets by mouth daily.        . carboxymethylcellulose (REFRESH TEARS) 0.5 % SOLN Place 1 drop into both eyes 2 (two) times daily. Right eye only      . cholecalciferol (VITAMIN D) 1000 UNITS tablet Take 1,000 Units by mouth daily.        Marland Kitchen desipramine (NORPRAMIN) 25 MG tablet One tab po qhs x one week then take 2 tabs po qhs thereafter  180 tablet  3  . Docusate Calcium (STOOL SOFTENER PO) Take 2 tablets by mouth daily.      Marland Kitchen FLUoxetine (PROZAC) 20 MG capsule Take 1 capsule (20 mg total) by mouth daily.  90 capsule  3  . gabapentin (NEURONTIN) 300 MG capsule Take 1 capsule (300 mg total) by mouth 3 (three) times daily. 2 tabs qhs  150 capsule  12  . hydrOXYzine (VISTARIL) 25 MG capsule Take 1 capsule (25 mg total) by mouth at bedtime.  30 capsule  5  . levothyroxine (SYNTHROID, LEVOTHROID) 175 MCG tablet Take 1 tablet by mouth  every day  90 tablet  1  . loratadine (ALAVERT) 10 MG tablet Take 10 mg by mouth daily.        Marland Kitchen LORazepam (ATIVAN) 0.5 MG tablet TAKE 1 TABLET UP TO 3 TIMES A DAY AS NEEDED.  90 tablet  5  . lovastatin (MEVACOR) 40 MG tablet Take 1 tablet by mouth at  bedtime  90 tablet  1  . meclizine (ANTIVERT) 25 MG tablet TAKE 1 TABLET BY MOUTH EVERY 6 HOURS AS NEEDED FOR NAUSEA OR DIZZINESS  100 tablet  1  . moxifloxacin (VIGAMOX) 0.5 % ophthalmic solution Place 1 drop into the left eye 3 (three) times daily.      . Multiple Vitamins-Iron (MULTIVITAMINS WITH IRON) TABS Take 1 tablet by mouth daily.      . Multiple  Vitamins-Minerals (ICAPS) CAPS Take by mouth daily.      Marland Kitchen omeprazole (PRILOSEC) 20 MG capsule Take 1 capsule (20 mg total) by mouth 2 (two) times daily.      . promethazine (PHENERGAN) 25 MG tablet TAKE 1 TABLET EVERY 6 HOURS AS NEEDED  30 tablet  5  . topiramate (TOPAMAX) 200 MG tablet Take 1 tablet by mouth  twice a day  180 tablet  1  . vitamin E 400 UNIT capsule Take 400 Units by mouth daily.         No current facility-administered medications on file prior to visit.      Review of Systems System review is negative for any constitutional, cardiac, pulmonary, GI or neuro symptoms or complaints other than as described in the HPI.     Objective:   Physical Exam Filed Vitals:   04/17/13 1605  BP: 152/90  Pulse: 77  Temp: 98.3 F (36.8 C)   gen'l- WNWD woman in no distress HEENT - left nostril small, no visible lesion or site of bleeding. Throat - some mucus in the pharynx but no lesions     Assessment & Plan:  Epistaxis - bleeding intermittently from the left nostril  Plan ENT referral

## 2013-04-17 NOTE — Patient Instructions (Signed)
Nosebleeds - with 1 one month history of free flowing nose bleeds you will need to see an ENT doctor who has better tools for looking high up in the nostril for a potential bleeding site - usually a superficial blood vessel they can cauterize.\  Plan Referral to [facility] on [address]  - will ask for the 1st available appointment after thanksgiving.  Sore throat - on exam there is no sign of infection or a lesion.  Plan Gargle of choice.

## 2013-04-17 NOTE — Progress Notes (Signed)
Pre visit review using our clinic review tool, if applicable. No additional management support is needed unless otherwise documented below in the visit note. 

## 2013-04-19 ENCOUNTER — Other Ambulatory Visit: Payer: Self-pay | Admitting: Obstetrics and Gynecology

## 2013-04-19 DIAGNOSIS — N6002 Solitary cyst of left breast: Secondary | ICD-10-CM

## 2013-04-19 DIAGNOSIS — N644 Mastodynia: Secondary | ICD-10-CM

## 2013-04-22 ENCOUNTER — Other Ambulatory Visit: Payer: Self-pay

## 2013-04-22 MED ORDER — ATENOLOL 50 MG PO TABS: 25.0000 mg | ORAL_TABLET | Freq: Every day | ORAL | Status: DC

## 2013-04-30 DIAGNOSIS — Z23 Encounter for immunization: Secondary | ICD-10-CM | POA: Diagnosis not present

## 2013-04-30 DIAGNOSIS — R040 Epistaxis: Secondary | ICD-10-CM | POA: Diagnosis not present

## 2013-04-30 DIAGNOSIS — H6120 Impacted cerumen, unspecified ear: Secondary | ICD-10-CM | POA: Diagnosis not present

## 2013-05-07 ENCOUNTER — Ambulatory Visit
Admission: RE | Admit: 2013-05-07 | Discharge: 2013-05-07 | Disposition: A | Discharge status: Home / Self Care | Payer: Medicare Other | Source: Ambulatory Visit | Attending: Obstetrics and Gynecology | Admitting: Obstetrics and Gynecology

## 2013-05-07 DIAGNOSIS — N644 Mastodynia: Secondary | ICD-10-CM

## 2013-05-07 DIAGNOSIS — N6002 Solitary cyst of left breast: Secondary | ICD-10-CM

## 2013-05-07 DIAGNOSIS — D1779 Benign lipomatous neoplasm of other sites: Secondary | ICD-10-CM | POA: Diagnosis not present

## 2013-05-07 DIAGNOSIS — N6459 Other signs and symptoms in breast: Secondary | ICD-10-CM | POA: Diagnosis not present

## 2013-05-13 ENCOUNTER — Ambulatory Visit: Payer: Medicare Other | Admitting: Nurse Practitioner

## 2013-05-15 DIAGNOSIS — Z124 Encounter for screening for malignant neoplasm of cervix: Secondary | ICD-10-CM | POA: Diagnosis not present

## 2013-05-16 ENCOUNTER — Other Ambulatory Visit (HOSPITAL_COMMUNITY): Payer: Self-pay | Admitting: Obstetrics and Gynecology

## 2013-05-16 DIAGNOSIS — R1011 Right upper quadrant pain: Secondary | ICD-10-CM

## 2013-05-17 ENCOUNTER — Ambulatory Visit (HOSPITAL_COMMUNITY): Payer: Medicare Other

## 2013-05-31 ENCOUNTER — Ambulatory Visit (HOSPITAL_COMMUNITY)
Admission: RE | Admit: 2013-05-31 | Discharge: 2013-05-31 | Disposition: A | Discharge status: Home / Self Care | Payer: Medicare Other | Source: Ambulatory Visit | Attending: Obstetrics and Gynecology | Admitting: Obstetrics and Gynecology

## 2013-05-31 DIAGNOSIS — K8689 Other specified diseases of pancreas: Secondary | ICD-10-CM | POA: Diagnosis not present

## 2013-05-31 DIAGNOSIS — R1011 Right upper quadrant pain: Secondary | ICD-10-CM

## 2013-05-31 DIAGNOSIS — R109 Unspecified abdominal pain: Secondary | ICD-10-CM | POA: Insufficient documentation

## 2013-06-05 ENCOUNTER — Other Ambulatory Visit: Payer: Self-pay | Admitting: Internal Medicine

## 2013-06-05 ENCOUNTER — Other Ambulatory Visit: Payer: Self-pay | Admitting: Obstetrics and Gynecology

## 2013-06-05 DIAGNOSIS — K869 Disease of pancreas, unspecified: Secondary | ICD-10-CM

## 2013-06-06 DIAGNOSIS — N959 Unspecified menopausal and perimenopausal disorder: Secondary | ICD-10-CM | POA: Diagnosis not present

## 2013-06-06 DIAGNOSIS — M949 Disorder of cartilage, unspecified: Secondary | ICD-10-CM | POA: Diagnosis not present

## 2013-06-06 DIAGNOSIS — M899 Disorder of bone, unspecified: Secondary | ICD-10-CM | POA: Diagnosis not present

## 2013-06-06 DIAGNOSIS — Z13220 Encounter for screening for lipoid disorders: Secondary | ICD-10-CM | POA: Diagnosis not present

## 2013-06-06 DIAGNOSIS — Z1329 Encounter for screening for other suspected endocrine disorder: Secondary | ICD-10-CM | POA: Diagnosis not present

## 2013-06-12 ENCOUNTER — Other Ambulatory Visit: Payer: Medicare Other

## 2013-06-19 ENCOUNTER — Ambulatory Visit
Admission: RE | Admit: 2013-06-19 | Discharge: 2013-06-19 | Disposition: A | Discharge status: Home / Self Care | Payer: Medicare Other | Source: Ambulatory Visit | Attending: Obstetrics and Gynecology | Admitting: Obstetrics and Gynecology

## 2013-06-19 DIAGNOSIS — K8689 Other specified diseases of pancreas: Secondary | ICD-10-CM | POA: Diagnosis not present

## 2013-06-19 DIAGNOSIS — K869 Disease of pancreas, unspecified: Secondary | ICD-10-CM

## 2013-06-19 MED ORDER — GADOBENATE DIMEGLUMINE 529 MG/ML IV SOLN
15.0000 mL | Freq: Once | INTRAVENOUS | Status: AC | PRN
  Administered 2013-06-19: 15 mL via INTRAVENOUS

## 2013-06-20 ENCOUNTER — Encounter: Payer: Self-pay | Admitting: Internal Medicine

## 2013-06-20 ENCOUNTER — Ambulatory Visit (INDEPENDENT_AMBULATORY_CARE_PROVIDER_SITE_OTHER): Payer: Medicare Other | Admitting: Internal Medicine

## 2013-06-20 VITALS — BP 134/82 | HR 81 | Temp 97.7°F | Wt 165.0 lb

## 2013-06-20 DIAGNOSIS — R21 Rash and other nonspecific skin eruption: Secondary | ICD-10-CM | POA: Diagnosis not present

## 2013-06-20 DIAGNOSIS — N390 Urinary tract infection, site not specified: Secondary | ICD-10-CM

## 2013-06-20 DIAGNOSIS — L538 Other specified erythematous conditions: Secondary | ICD-10-CM

## 2013-06-20 MED ORDER — CEPHALEXIN 250 MG PO CAPS: 250.0000 mg | ORAL_CAPSULE | Freq: Four times a day (QID) | ORAL | Status: DC

## 2013-06-20 NOTE — Progress Notes (Signed)
Pre visit review using our clinic review tool, if applicable. No additional management support is needed unless otherwise documented below in the visit note. 

## 2013-06-20 NOTE — Patient Instructions (Addendum)
1. Urinary track infection - symptoms are very suggestive and you are tender on exam Plan Keflex 250 mg twice a day for 7 days  Over the counter azo-pyridine to help with the burning pain  Tylenol for any fever  Continue to hydrate.  2. Rash on the back - non specific inflammation that appears minor Plan Over the counter 1% cortisone applied in a thin coat twice a day.   Urinary Tract Infection Urinary tract infections (UTIs) can develop anywhere along your urinary tract. Your urinary tract is your body's drainage system for removing wastes and extra water. Your urinary tract includes two kidneys, two ureters, a bladder, and a urethra. Your kidneys are a pair of bean-shaped organs. Each kidney is about the size of your fist. They are located below your ribs, one on each side of your spine. CAUSES Infections are caused by microbes, which are microscopic organisms, including fungi, viruses, and bacteria. These organisms are so small that they can only be seen through a microscope. Bacteria are the microbes that most commonly cause UTIs. SYMPTOMS  Symptoms of UTIs may vary by age and gender of the patient and by the location of the infection. Symptoms in young women typically include a frequent and intense urge to urinate and a painful, burning feeling in the bladder or urethra during urination. Older women and men are more likely to be tired, shaky, and weak and have muscle aches and abdominal pain. A fever may mean the infection is in your kidneys. Other symptoms of a kidney infection include pain in your back or sides below the ribs, nausea, and vomiting. DIAGNOSIS To diagnose a UTI, your caregiver will ask you about your symptoms. Your caregiver also will ask to provide a urine sample. The urine sample will be tested for bacteria and white blood cells. White blood cells are made by your body to help fight infection. TREATMENT  Typically, UTIs can be treated with medication. Because most UTIs are  caused by a bacterial infection, they usually can be treated with the use of antibiotics. The choice of antibiotic and length of treatment depend on your symptoms and the type of bacteria causing your infection. HOME CARE INSTRUCTIONS  If you were prescribed antibiotics, take them exactly as your caregiver instructs you. Finish the medication even if you feel better after you have only taken some of the medication.  Drink enough water and fluids to keep your urine clear or pale yellow.  Avoid caffeine, tea, and carbonated beverages. They tend to irritate your bladder.  Empty your bladder often. Avoid holding urine for long periods of time.  Empty your bladder before and after sexual intercourse.  After a bowel movement, women should cleanse from front to back. Use each tissue only once. SEEK MEDICAL CARE IF:   You have back pain.  You develop a fever.  Your symptoms do not begin to resolve within 3 days. SEEK IMMEDIATE MEDICAL CARE IF:   You have severe back pain or lower abdominal pain.  You develop chills.  You have nausea or vomiting.  You have continued burning or discomfort with urination. MAKE SURE YOU:   Understand these instructions.  Will watch your condition.  Will get help right away if you are not doing well or get worse. Document Released: 02/23/2005 Document Revised: 11/15/2011 Document Reviewed: 06/24/2011 [facility] Patient Information 2014 [place].

## 2013-06-20 NOTE — Progress Notes (Signed)
Subjective:    Patient ID: [name], female    DOB: [birth year], 73 y.o.   MRN: [id number]  HPI [name] presents for acute dysuria since Monday, increased frequency, no fever, positive for rigors, pain in the back and radiating to groin and suprapubic area. No blood in the urine but concentrated. No nausea or vomiting. She did drink cranberry juice which seemed to help. She also had relief with vigorous hydration.   She also has a painless rash at the right temple - 2 80mm erythematous papules without fluid.   She also has a lot of itching at the low back and feels like there is bruising.  Past Medical History  Diagnosis Date  . Benign neoplasm of colon   . Osteoarthritis   . Hiatal hernia   . Diarrhea   . Back pain   . Chronic maxillary sinusitis   . Depression   . Eustachian tube dysfunction   . Tardive dyskinesia   . Mitral valve prolapse   . Osteoarthritis   . GERD (gastroesophageal reflux disease)   . Allergic rhinitis   . Glaucoma   . Hypothyroid   . Hypertension    Past Surgical History  Procedure Laterality Date  . Knee surgery      x 4  . 1 baker cyst removed    . Dilation and curettage of uterus      x3  . Lumbar spine surgery    . Abdominal hysterectomy    . Eye surgery    . Lumbar spine surgery     Family History  Problem Relation Age of Onset  . Cancer Mother     Breast Cancer with Metastatic disease  . Alzheimer's disease Father    History   Social History  . Marital Status: Widowed    Spouse Name: N/A    Number of Children: 2  . Years of Education: N/A   Occupational History  .     Social History Main Topics  . Smoking status: Never Smoker   . Smokeless tobacco: Not on file  . Alcohol Use: No  . Drug Use: No  . Sexual Activity: Not on file   Other Topics Concern  . 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 acetaminophen (TYLENOL) 500 MG tablet Take 1 tablet (500 mg total) by mouth every 6 (six) hours as needed for pain.  30 tablet  0  . Artificial Tear Ointment (REFRESH LACRI-LUBE) OINT Place 1 application into both eyes daily.      Marland Kitchen aspirin 81 MG tablet Take 81 mg by mouth daily.        Marland Kitchen atenolol (TENORMIN) 50 MG tablet Take 0.5 tablets (25 mg total) by mouth daily.  90 tablet  3  . Calcium Carbonate-Vitamin D (CALCIUM 600+D) 600-200 MG-UNIT TABS Take 2 tablets by mouth daily.        . carboxymethylcellulose (REFRESH TEARS) 0.5 % SOLN Place 1 drop into both eyes 2 (two) times daily. Right eye only      . cholecalciferol (VITAMIN D) 1000 UNITS tablet Take  1,000 Units by mouth daily.        Marland Kitchen desipramine (NORPRAMIN) 25 MG tablet One tab po qhs x one week then take 2 tabs po qhs thereafter  180 tablet  3  . Docusate Calcium (STOOL SOFTENER PO) Take 2 tablets by mouth daily.      Marland Kitchen FLUoxetine (PROZAC) 20 MG capsule Take 1 capsule (20 mg total) by mouth daily.  90 capsule  3  . gabapentin (NEURONTIN) 300 MG capsule Take 1 capsule (300 mg total) by mouth 3 (three) times daily. 2 tabs qhs  150 capsule  12  . hydrOXYzine (VISTARIL) 25 MG capsule Take 1 capsule (25 mg total) by mouth at bedtime.  30 capsule  5  . levothyroxine (SYNTHROID, LEVOTHROID) 175 MCG tablet Take 1 tablet by mouth  every day  90 tablet  1  . loratadine (ALAVERT) 10 MG tablet Take 10 mg by mouth daily.        Marland Kitchen LORazepam (ATIVAN) 0.5 MG tablet TAKE 1 TABLET UP TO 3 TIMES A DAY  90 tablet  4  . lovastatin (MEVACOR) 40 MG tablet Take 1 tablet by mouth at  bedtime  90 tablet  1  . meclizine (ANTIVERT) 25 MG tablet TAKE 1 TABLET BY MOUTH EVERY 6 HOURS AS NEEDED FOR NAUSEA OR DIZZINESS  100 tablet  1  . moxifloxacin (VIGAMOX) 0.5 %  ophthalmic solution Place 1 drop into the left eye 3 (three) times daily.      . Multiple Vitamins-Iron (MULTIVITAMINS WITH IRON) TABS Take 1 tablet by mouth daily.      . Multiple Vitamins-Minerals (ICAPS) CAPS Take by mouth daily.      Marland Kitchen omeprazole (PRILOSEC) 20 MG capsule Take 1 capsule (20 mg total) by mouth 2 (two) times daily.      . promethazine (PHENERGAN) 25 MG tablet TAKE 1 TABLET EVERY 6 HOURS AS NEEDED  30 tablet  5  . topiramate (TOPAMAX) 200 MG tablet Take 1 tablet by mouth  twice a day  180 tablet  1  . UNABLE TO FIND Doxep/Amanta/DXM/Lidocaine 25 mg for her feet      . vitamin E 400 UNIT capsule Take 400 Units by mouth daily.         No current facility-administered medications on file prior to visit.      Review of Systems System review is negative for any constitutional, cardiac, pulmonary, GI or neuro symptoms or complaints other than as described in the HPI.     Objective:   Physical Exam Filed Vitals:   06/20/13 1111  BP: 134/82  Pulse: 81  Temp: 97.7 F (36.5 C)   Wt Readings from Last 3 Encounters:  06/20/13 165 lb (74.844 kg)  04/17/13 164 lb (74.39 kg)  01/18/13 162 lb 12.8 oz (73.846 kg)   Gen'l - overweight woman in no distress HEENT - normal Cor 2+ radial, RRR Pulm - normal respirations Abd - tender to percussion over the left flank, very tender to palpation suprapubic area and bilateral groin Derm - 2 77mm erythematous papules right temple; erythematous feint macular rash right of mid-line at the low back.      Assessment & Plan:  1. Urinary track infection - symptoms are very suggestive and you are tender on exam Plan Keflex 250 mg twice a day for 7 days  Over the counter azo-pyridine to help with the burning pain  Tylenol for any fever  Continue to hydrate.  2. Rash on the back - non  specific inflammation that appears minor Plan Over the counter 1% cortisone applied in a thin coat twice a day.

## 2013-07-01 ENCOUNTER — Ambulatory Visit (INDEPENDENT_AMBULATORY_CARE_PROVIDER_SITE_OTHER): Payer: Medicare Other | Admitting: General Surgery

## 2013-07-01 ENCOUNTER — Ambulatory Visit
Admission: RE | Admit: 2013-07-01 | Discharge: 2013-07-01 | Disposition: A | Discharge status: Home / Self Care | Payer: Medicare Other | Source: Ambulatory Visit | Attending: General Surgery | Admitting: General Surgery

## 2013-07-01 ENCOUNTER — Telehealth (INDEPENDENT_AMBULATORY_CARE_PROVIDER_SITE_OTHER): Payer: Self-pay

## 2013-07-01 ENCOUNTER — Encounter (INDEPENDENT_AMBULATORY_CARE_PROVIDER_SITE_OTHER): Payer: Self-pay | Admitting: General Surgery

## 2013-07-01 VITALS — BP 120/86 | HR 92 | Temp 98.4°F | Resp 17 | Ht 62.5 in | Wt 163.4 lb

## 2013-07-01 DIAGNOSIS — C250 Malignant neoplasm of head of pancreas: Secondary | ICD-10-CM | POA: Insufficient documentation

## 2013-07-01 DIAGNOSIS — K7689 Other specified diseases of liver: Secondary | ICD-10-CM | POA: Diagnosis not present

## 2013-07-01 DIAGNOSIS — C259 Malignant neoplasm of pancreas, unspecified: Secondary | ICD-10-CM

## 2013-07-01 LAB — CBC WITH DIFFERENTIAL/PLATELET
BASOS ABS: 0 10*3/uL (ref 0.0–0.1)
BASOS PCT: 0 % (ref 0–1)
EOS ABS: 0.2 10*3/uL (ref 0.0–0.7)
Eosinophils Relative: 3 % (ref 0–5)
HCT: 38.3 % (ref 36.0–46.0)
Hemoglobin: 13.4 g/dL (ref 12.0–15.0)
Lymphocytes Relative: 26 % (ref 12–46)
Lymphs Abs: 1.7 10*3/uL (ref 0.7–4.0)
MCH: 32.4 pg (ref 26.0–34.0)
MCHC: 35 g/dL (ref 30.0–36.0)
MCV: 92.7 fL (ref 78.0–100.0)
Monocytes Absolute: 0.4 10*3/uL (ref 0.1–1.0)
Monocytes Relative: 6 % (ref 3–12)
Neutro Abs: 4.1 10*3/uL (ref 1.7–7.7)
Neutrophils Relative %: 65 % (ref 43–77)
PLATELETS: 135 10*3/uL — AB (ref 150–400)
RBC: 4.13 MIL/uL (ref 3.87–5.11)
RDW: 13.4 % (ref 11.5–15.5)
WBC: 6.3 10*3/uL (ref 4.0–10.5)

## 2013-07-01 LAB — COMPREHENSIVE METABOLIC PANEL
ALBUMIN: 4.4 g/dL (ref 3.5–5.2)
ALK PHOS: 72 U/L (ref 39–117)
ALT: 22 U/L (ref 0–35)
AST: 20 U/L (ref 0–37)
BUN: 12 mg/dL (ref 6–23)
CO2: 26 mEq/L (ref 19–32)
Calcium: 9.2 mg/dL (ref 8.4–10.5)
Chloride: 105 mEq/L (ref 96–112)
Creat: 0.81 mg/dL (ref 0.50–1.10)
GLUCOSE: 87 mg/dL (ref 70–99)
POTASSIUM: 4.1 meq/L (ref 3.5–5.3)
SODIUM: 142 meq/L (ref 135–145)
Total Bilirubin: 0.5 mg/dL (ref 0.2–1.2)
Total Protein: 6.5 g/dL (ref 6.0–8.3)

## 2013-07-01 MED ORDER — IOHEXOL 300 MG/ML  SOLN
75.0000 mL | Freq: Once | INTRAMUSCULAR | Status: AC | PRN
  Administered 2013-07-01: 75 mL via INTRAVENOUS

## 2013-07-01 NOTE — Telephone Encounter (Signed)
Pt has consult appt with Dr. Paulita Fujita on 07/09/13 at 12:00 noon.  Her EUS is scheduled for 07/10/13.  Pt made aware of appointments.

## 2013-07-01 NOTE — Progress Notes (Signed)
Quick Note:  Please let patient know that Chest CT looks OK ______

## 2013-07-01 NOTE — Patient Instructions (Addendum)
We will schedule EUS (endoscopic ultrasound) with Dr. Paulita Fujita (Dr. Loney Loh partner).  This is to get a better look at pancreatic mass and to do biopsy.  I will also order labs and a chest CT.    If this is a cancer, we will also send you to genetics since your mother had breast cancer.

## 2013-07-02 ENCOUNTER — Encounter (HOSPITAL_COMMUNITY): Payer: Self-pay | Admitting: *Deleted

## 2013-07-02 ENCOUNTER — Encounter (HOSPITAL_COMMUNITY): Payer: Self-pay | Admitting: Pharmacy Technician

## 2013-07-02 NOTE — Progress Notes (Signed)
Chief Complaint  Patient presents with  . New Evaluation    eval pancreatic lesion    HISTORY: Pt is a 72 yo F who presents with around 6 months of abdominal pain.  She denies significant weight loss.  Her weight has been up and down.  She continues to have a good appetite overall, but food doesn't sound as good as before.  She is not diabetic.  She has had some diarrhea on and off.  She denies constipation.  She describes the pain as an ache.  It is moderate in intensity.  Not a lot helps with the pain.  She denies jaundice or dark urine.  She has recently had a UTI.   She has been off balance since her back and knee surgery.  An ultrasound was ordered to evaluate her abdominal pain.  She had a distended gallbladder, and she had a suspected lesion in her pancreatic head.  MRI confirmed this.  She has had some nosebleeds, but these have resolved.    Past Medical History  Diagnosis Date  . Benign neoplasm of colon   . Osteoarthritis   . Hiatal hernia   . Diarrhea   . Back pain   . Chronic maxillary sinusitis   . Depression   . Eustachian tube dysfunction   . Tardive dyskinesia   . Mitral valve prolapse   . Osteoarthritis   . GERD (gastroesophageal reflux disease)   . Allergic rhinitis   . Glaucoma   . Hypothyroid   . Hypertension   . Heart murmur     hx. "MVP" -predental antibiotics  . Memory loss   . Cancer     Pancreatic cancer with MRI scan 06-19-13    Past Surgical History  Procedure Laterality Date  . Knee surgery Left     x 5  . 1 baker cyst removed    . Dilation and curettage of uterus      x3  . Lumbar spine surgery      x2  . Abdominal hysterectomy    . Lumbar spine surgery      cyst  . Breast surgery      left 2 timew  . Joint replacement      LTKA  . Back surgery      fusion  . Blepharoplasty Bilateral     with cataract surgery  . Eye surgery Right     cataract    Current Outpatient Prescriptions  Medication Sig Dispense Refill  . aspirin 81 MG  tablet Take 81 mg by mouth daily.        . carboxymethylcellulose (REFRESH TEARS) 0.5 % SOLN Place 1 drop into the right eye 2 (two) times daily.       . cholecalciferol (VITAMIN D) 1000 UNITS tablet Take 1,000 Units by mouth 2 (two) times daily.       . cyanocobalamin 2000 MCG tablet Take 2,000 mcg by mouth daily.      . moxifloxacin (VIGAMOX) 0.5 % ophthalmic solution Place 1 drop into the left eye 2 (two) times daily.       . Multiple Vitamins-Minerals (ICAPS) CAPS Take 1 capsule by mouth daily.       . UNABLE TO FIND Apply 1 application topically 2 (two) times daily. Doxep/Amanta/DXM/Lidocaine 25 mg for her feet      . vitamin E 400 UNIT capsule Take 800 Units by mouth 2 (two) times daily.       . Ascorbic Acid (VITAMIN C) 1000 MG   tablet Take 1,000 mg by mouth 2 (two) times daily.      . atenolol (TENORMIN) 50 MG tablet Take 25 mg by mouth every morning.      . Calcium Carbonate-Vitamin D (CALTRATE 600+D PO) Take 1 tablet by mouth 2 (two) times daily.      . cephALEXin (KEFLEX) 250 MG capsule Take 250 mg by mouth 2 (two) times daily. Started 14 day supply 06/20/2013      . docusate sodium (COLACE) 100 MG capsule Take 200 mg by mouth at bedtime.      . gabapentin (NEURONTIN) 300 MG capsule Take 300 mg by mouth 2 (two) times daily.      . hydrOXYzine (VISTARIL) 25 MG capsule Take 25 mg by mouth at bedtime.      . levothyroxine (SYNTHROID, LEVOTHROID) 175 MCG tablet Take 175 mcg by mouth daily before breakfast.      . loratadine (CLARITIN) 10 MG tablet Take 10 mg by mouth daily.      . LORazepam (ATIVAN) 0.5 MG tablet Take 0.5 mg by mouth every 8 (eight) hours.      . meclizine (ANTIVERT) 25 MG tablet Take 12.5 mg by mouth 3 (three) times daily as needed for dizziness.      . Multiple Vitamin (MULTIVITAMIN WITH MINERALS) TABS tablet Take 1 tablet by mouth daily.      . omeprazole (PRILOSEC OTC) 20 MG tablet Take 20 mg by mouth daily.      . polycarbophil (FIBERCON) 625 MG tablet Take 1,250 mg  by mouth at bedtime.      . promethazine (PHENERGAN) 25 MG tablet Take 12.5 mg by mouth every 6 (six) hours as needed for nausea or vomiting.      . topiramate (TOPAMAX) 200 MG tablet Take 200 mg by mouth 2 (two) times daily.       No current facility-administered medications for this visit.     Allergies  Allergen Reactions  . Celecoxib Other (See Comments)    unknown  . Glucosamine     unknown  . Metoclopramide Hcl Other (See Comments)    shaking  . Niacin Other (See Comments)    shaking  . Phenazopyridine Hcl Other (See Comments)    unknown  . Sulfonamide Derivatives Nausea And Vomiting  . Trovan [Alatrofloxacin Mesylate]   . Benzocaine-Resorcinol Rash  . Sulfa Antibiotics Rash     Family History  Problem Relation Age of Onset  . Cancer Mother     Breast Cancer with Metastatic disease  . Alzheimer's disease Father      History   Social History  . Marital Status: Widowed    Spouse Name: N/A    Number of Children: 2  . Years of Education: N/A   Occupational History  .     Social History Main Topics  . Smoking status: Never Smoker   . Smokeless tobacco: Never Used  . Alcohol Use: No  . Drug Use: No  . Sexual Activity: Not Currently   Other Topics Concern  . 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  Well nourished and well groomed.   Neurological: Alert and  oriented to person, place, and time. Slightly unsteady Head: Normocephalic and atraumatic.  Eyes: Conjunctivae are normal. Pupils are equal, round, and reactive to light. No scleral icterus.  Neck: Normal range of motion. Neck supple. No tracheal deviation or thyromegaly present.  Cardiovascular: Normal rate, regular rhythm, normal heart sounds and intact distal pulses.  Exam reveals no gallop and no friction rub.  No murmur heard. Respiratory: Effort normal.  No respiratory distress. No chest wall tenderness. Breath sounds normal.  No wheezes, rales or rhonchi.  GI: Soft. Bowel sounds are normal. The abdomen is soft and nontender.  There is no rebound and no guarding.  Musculoskeletal: Normal range of motion. Extremities are nontender.  Lymphadenopathy: No cervical, preauricular, postauricular or axillary adenopathy is present Skin: Skin is warm and dry. No rash noted. No diaphoresis. No erythema. No pallor. No clubbing, cyanosis, or edema.   Psychiatric: Normal mood and affect. Behavior is normal. Judgment and thought content normal.    LABORATORY RESULTS: Available labs are reviewed   Recent Results (from the past 2160 hour(s))  COMPREHENSIVE METABOLIC PANEL     Status: None   Collection Time    07/01/13  1:24 PM      Result Value Range   Sodium 142  135 - 145 mEq/L   Potassium 4.1  3.5 - 5.3 mEq/L   Chloride 105  96 - 112 mEq/L   CO2 26  19 - 32 mEq/L   Glucose, Bld 87  70 - 99 mg/dL   BUN 12  6 - 23 mg/dL   Creat 0.81  0.50 - 1.10 mg/dL   Total Bilirubin 0.5  0.2 - 1.2 mg/dL   Comment: ** Please note change in reference range(s). **   Alkaline Phosphatase 72  39 - 117 U/L   AST 20  0 - 37 U/L   ALT 22  0 - 35 U/L   Total Protein 6.5  6.0 - 8.3 g/dL   Albumin 4.4  3.5 - 5.2 g/dL   Calcium 9.2  8.4 - 10.5 mg/dL  CBC WITH DIFFERENTIAL     Status: Abnormal   Collection Time    07/01/13  1:24 PM      Result Value Range   WBC 6.3  4.0 - 10.5 K/uL   RBC 4.13  3.87 - 5.11 MIL/uL    Hemoglobin 13.4  12.0 - 15.0 g/dL   HCT 38.3  36.0 - 46.0 %   MCV 92.7  78.0 - 100.0 fL   MCH 32.4  26.0 - 34.0 pg   MCHC 35.0  30.0 - 36.0 g/dL   RDW 13.4  11.5 - 15.5 %   Platelets 135 (*) 150 - 400 K/uL   Neutrophils Relative % 65  43 - 77 %   Neutro Abs 4.1  1.7 - 7.7 K/uL   Lymphocytes Relative 26  12 - 46 %   Lymphs Abs 1.7  0.7 - 4.0 K/uL   Monocytes Relative 6  3 - 12 %   Monocytes Absolute 0.4  0.1 - 1.0 K/uL  Eosinophils Relative 3  0 - 5 %   Eosinophils Absolute 0.2  0.0 - 0.7 K/uL   Basophils Relative 0  0 - 1 %   Basophils Absolute 0.0  0.0 - 0.1 K/uL   Smear Review Criteria for review not met       RADIOLOGY RESULTS: See E-Chart or I-Site for most recent results.  Images and reports are reviewed.  Ct Chest W Contrast  07/01/2013   CLINICAL DATA:  Findings concerning for pancreatic mass on prior MRI, history of weight loss  EXAM: CT CHEST WITH CONTRAST  TECHNIQUE: Multidetector CT imaging of the chest was performed during intravenous contrast administration.  CONTRAST:  75mL OMNIPAQUE IOHEXOL 300 MG/ML  SOLN  COMPARISON:  DG CHEST 2 VIEW dated 10/11/2012  FINDINGS: The thoracic inlet is unremarkable. Subcentimeter axillary lymph nodes are identified.  There is no evidence of mediastinal nor hilar adenopathy nor masses. Atherosclerotic calcifications are appreciated within coronary vessels.  There is no evidence of pulmonary nodules, masses, infiltrates, effusions, nor edema. Hypoventilation is appreciated within the lung bases. There is diffuse mild thickening of interstitial markings.  The visualized upper abdominal viscera demonstrate diffuse low attenuation within the visualized liver parenchyma and otherwise unremarkable.  The osseous structures demonstrate no evidence of aggressive appearing lesions.  IMPRESSION: 1. Mild diffuse interstitial prominence may reflect a component of minimal chronic interstitial changes. Hepatic steatosis. There is otherwise no focal or acute  abnormalities appreciated.   Electronically Signed   By: Hector  Cooper M.D.   On: 07/01/2013 13:36   Mr Abdomen W Wo Contrast  06/19/2013   CLINICAL DATA:  Abdominal pain, possible pancreatic head mass on prior exam  EXAM: MRI ABDOMEN WITHOUT AND WITH CONTRAST  TECHNIQUE: Multiplanar multisequence MR imaging of the abdomen was performed both before and after the administration of intravenous contrast.  BUN and creatinine were obtained on site at [facility] at  [address]  Results:  BUN 13 mg/dL,  Creatinine 0.7 mg/dL.  CONTRAST:  15mL MULTIHANCE GADOBENATE DIMEGLUMINE 529 MG/ML IV SOLN  COMPARISON:  Abdominal ultrasound 05/31/2013 at [facility]  FINDINGS: Pancreatic atrophy is noted with pancreatic ductal dilatation to 7 mm at the level of the midbody. There is abrupt pancreatic duct caliber change at the level of the pancreatic head, where a 1.5 x 1.1 cm solid mass is identified as seen on the prior exam. The mass demonstrates no hyperintensity on T1 weighted or T2 weighted images and is mildly hypo enhancing with respect to the remainder of the pancreatic parenchyma. Celiac axis, major tributaries, and superior mesenteric artery enhance with contrast without evidence for encasement. Liver, spleen, and adrenal glands are unremarkable. Possible 2 mm stone versus artifact at the neck of the gallbladder without other evidence for acute cholecystitis. No intrahepatic ductal dilatation. There is mild motion artifact on the late postcontrast images. No ascites or lymphadenopathy.  IMPRESSION: Irregular hypoenhancing mass at the head of the pancreas causing distal pancreatic ductal dilatation. The most common differential consideration would be pancreatic adenocarcinoma. Metastatic disease most typically from renal cell carcinoma, small cell lung cell carcinoma, gastrointestinal primary, breast cancer, or melanoma, may also have this appearance. Metastatic disease is felt less  likely in the absence of other evidence for intra-abdominal metastatic disease or primary neoplasm elsewhere. If the patient has any clinical abnormality suggesting a metabolically active tumor, then insulinoma or gastrinoma would be the most common entities to have this appearance. Serous cystadenoma is felt much less   likely given the appearance on today's exam.  Surgical consultation is recommended. Allowing for MRI technique, there is no evidence for major vascular encasement or local intra-abdominal metastatic disease.   Electronically Signed   By: Gretchen  Green M.D.   On: 06/19/2013 14:44      ASSESSMENT AND PLAN: Pancreatic cancer Pt has highly suspicious mass in panc head that would be most consistent with pancreatic cancer.  I recommend EUS and biopsy  It could be pancreatic neuroendocrine tumor.  Pt able to get on bed, but wobbly and uses cane.  Unsure how she would do with a whipple.  Will get chest CT, CA 19-9, CMET, CBC.  I will follow her back up after these studies.  45 min spent in evaluation, examination, counseling, and coordination of care.  >50% spent in counseling.       Kassidee Narciso L Crystallynn Noorani MD Surgical Oncology, General and Endocrine Surgery [facility]      Visit Diagnoses: 1. Pancreatic cancer     Primary Care Physician: Michael Norins, MD    

## 2013-07-02 NOTE — Assessment & Plan Note (Signed)
Pt has highly suspicious mass in panc head that would be most consistent with pancreatic cancer.  I recommend EUS and biopsy  It could be pancreatic neuroendocrine tumor.  Pt able to get on bed, but wobbly and uses cane.  Unsure how she would do with a whipple.  Will get chest CT, CA 19-9, CMET, CBC.  I will follow her back up after these studies.  45 min spent in evaluation, examination, counseling, and coordination of care.  >50% spent in counseling.

## 2013-07-03 ENCOUNTER — Ambulatory Visit (INDEPENDENT_AMBULATORY_CARE_PROVIDER_SITE_OTHER): Payer: Medicare Other | Admitting: Nurse Practitioner

## 2013-07-03 ENCOUNTER — Encounter: Payer: Self-pay | Admitting: Nurse Practitioner

## 2013-07-03 VITALS — BP 152/78 | HR 82 | Ht 62.0 in | Wt 166.0 lb

## 2013-07-03 DIAGNOSIS — R209 Unspecified disturbances of skin sensation: Secondary | ICD-10-CM | POA: Diagnosis not present

## 2013-07-03 DIAGNOSIS — G609 Hereditary and idiopathic neuropathy, unspecified: Secondary | ICD-10-CM

## 2013-07-03 DIAGNOSIS — R202 Paresthesia of skin: Secondary | ICD-10-CM

## 2013-07-03 MED ORDER — GABAPENTIN 100 MG PO CAPS: ORAL_CAPSULE | ORAL | Status: DC

## 2013-07-03 NOTE — Patient Instructions (Signed)
Change gabapentin to 1-2 tablets 3 times a day and 3 tabs at night Will renew prescription Make sure your multivitamin has Folic acid Followup in 6 months

## 2013-07-03 NOTE — Progress Notes (Signed)
[facility]  PATIENT: [name] DOB: [birth year]   REASON FOR VISIT: Followup for history of lower extremity paresthesias  HISTORY OF PRESENT ILLNESS: Ms. Silvester, 73 year old female returns for followup. She has a long history of lower extremity paresthesias. She was last seen by Dr. Krista Blue 01/17/2013 and was on gabapentin 37m 3 times daily at that time however patient states the gabapentin makes her too drowsy and she is only taking it twice a day. She remains drowsy on the drug. She denies any pain down her extremities, no loss of bowel or bladder control no significant weakness. She has  mild gait difficulty due to foot pain.  She returns for reevaluation.   HISTORY: evaluation of bilateral lower extremity paresthesia  She had past medical history of hypertension, hyperlipidemia, depression, is on polypharmacy treatment, including Prozac 20 mg every day, Ativan 3 times a day, Topamax 200 mg twice a day,  She presented with more than 10 years history of bilateral feet paresthesia, getting worse over the past 2 years, she described bilateral feet numbness tingling, going up to her legs, her feet felt like it was broken sometimes, her feet burns, ice pack helps some, but sometimes she put on socks, wrapping tightly around her socks, which seems to help her symptoms some, her feet pain getting worse after bear weight,  She also has a history of chronic low back pain, had a history of low back decompression surgery by Dr. YLorin Mercyin 2004, she denies shooting pain to her neck, no bowel bladder incontinence, mild gait difficulty due to feet pain, no significant weakness  Most recent laboratory evaluation showed normal vitamin B12, TSH, CMP, elevated WBC 15.7  UPDATE January 17 2013:  Laboratory evaluations was normal including CBC, RPR, ANA, vitamin B12, TSH, mild elevated C. reactive protein of 11.2,  She has tried compounding cream, which has made her skin peel, she is taking  gabapentin 300 mg 3 times a day, which helped her 60%, also make her tired, the most bothersome symptoms at nighttime, she complains of bilateral plantar feet burning pain, she has to wrap her toes together, soak it in hot water, which only helped her temporarily.  She has mild gait difficulty due to bilateral feet pain, she denies bowel and bladder incontinence. She denies significant low back pain,      REVIEW OF SYSTEMS: Full 14 system review of systems performed and notable only for those listed, all others are neg:  Constitutional: N/A  Cardiovascular: N/A  Ear/Nose/Throat: Hearing loss  Skin: N/A  Eyes: itching eyes Respiratory: N/A  Gastroitestinal: N/A  Genitourinary: Frequency Hematology/Lymphatic: N/A  Endocrine: N/A Musculoskeletal:N/A  Allergy/Immunology: N/A  Neurological: Occasional headache Psychiatric: N/A   ALLERGIES: Allergies  Allergen Reactions  . Celecoxib Other (See Comments)    unknown  . Glucosamine     unknown  . Metoclopramide Hcl Other (See Comments)    shaking  . Niacin Other (See Comments)    shaking  . Phenazopyridine Hcl Other (See Comments)    unknown  . Sulfonamide Derivatives Nausea And Vomiting  . Trovan [Alatrofloxacin Mesylate]   . Benzocaine-Resorcinol Rash  . Sulfa Antibiotics Rash    HOME MEDICATIONS: Outpatient Prescriptions Prior to Visit  Medication Sig Dispense Refill  . Ascorbic Acid (VITAMIN C) 1000 MG tablet Take 1,000 mg by mouth 2 (two) times daily.      .Marland Kitchenaspirin 81 MG tablet Take 81 mg by mouth daily.        .Marland Kitchen  atenolol (TENORMIN) 50 MG tablet Take 25 mg by mouth every morning.      . Calcium Carbonate-Vitamin D (CALTRATE 600+D PO) Take 1 tablet by mouth 2 (two) times daily.      . carboxymethylcellulose (REFRESH TEARS) 0.5 % SOLN Place 1 drop into the right eye 2 (two) times daily.       . cephALEXin (KEFLEX) 250 MG capsule Take 250 mg by mouth 2 (two) times daily. Started 14 day supply 06/20/2013      .  cholecalciferol (VITAMIN D) 1000 UNITS tablet Take 1,000 Units by mouth 2 (two) times daily.       . cyanocobalamin 2000 MCG tablet Take 2,000 mcg by mouth daily.      Marland Kitchen docusate sodium (COLACE) 100 MG capsule Take 200 mg by mouth at bedtime.      . gabapentin (NEURONTIN) 300 MG capsule Take 300 mg by mouth 2 (two) times daily.      . hydrOXYzine (VISTARIL) 25 MG capsule Take 25 mg by mouth at bedtime.      Marland Kitchen levothyroxine (SYNTHROID, LEVOTHROID) 175 MCG tablet Take 175 mcg by mouth daily before breakfast.      . loratadine (CLARITIN) 10 MG tablet Take 10 mg by mouth daily.      Marland Kitchen LORazepam (ATIVAN) 0.5 MG tablet Take 0.5 mg by mouth every 8 (eight) hours.      . meclizine (ANTIVERT) 25 MG tablet Take 12.5 mg by mouth 3 (three) times daily as needed for dizziness.      . moxifloxacin (VIGAMOX) 0.5 % ophthalmic solution Place 1 drop into the left eye 2 (two) times daily.       . Multiple Vitamin (MULTIVITAMIN WITH MINERALS) TABS tablet Take 1 tablet by mouth daily.      . Multiple Vitamins-Minerals (ICAPS) CAPS Take 1 capsule by mouth daily.       Marland Kitchen omeprazole (PRILOSEC OTC) 20 MG tablet Take 20 mg by mouth daily.      . polycarbophil (FIBERCON) 625 MG tablet Take 1,250 mg by mouth at bedtime.      . promethazine (PHENERGAN) 25 MG tablet Take 12.5 mg by mouth every 6 (six) hours as needed for nausea or vomiting.      . topiramate (TOPAMAX) 200 MG tablet Take 200 mg by mouth 2 (two) times daily.      Marland Kitchen UNABLE TO FIND Apply 1 application topically 2 (two) times daily. Doxep/Amanta/DXM/Lidocaine 25 mg for her feet      . vitamin E 400 UNIT capsule Take 800 Units by mouth 2 (two) times daily.        No facility-administered medications prior to visit.    PAST MEDICAL HISTORY: Past Medical History  Diagnosis Date  . Benign neoplasm of colon   . Osteoarthritis   . Hiatal hernia   . Diarrhea   . Back pain   . Chronic maxillary sinusitis   . Depression   . Eustachian tube dysfunction   .  Tardive dyskinesia   . Mitral valve prolapse   . Osteoarthritis   . GERD (gastroesophageal reflux disease)   . Allergic rhinitis   . Glaucoma   . Hypothyroid   . Hypertension   . Heart murmur     hx. "MVP" -predental antibiotics  . Memory loss   . Cancer     Pancreatic cancer with MRI scan 06-19-13    PAST SURGICAL HISTORY: Past Surgical History  Procedure Laterality Date  . Knee surgery Left  x 5  . 1 baker cyst removed    . Dilation and curettage of uterus      x3  . Lumbar spine surgery      x2  . Abdominal hysterectomy    . Lumbar spine surgery      cyst  . Breast surgery      left 2 timew  . Joint replacement      LTKA  . Back surgery      fusion  . Blepharoplasty Bilateral     with cataract surgery  . Eye surgery Right     cataract    FAMILY HISTORY: Family History  Problem Relation Age of Onset  . Cancer Mother     Breast Cancer with Metastatic disease  . Alzheimer's disease Father     SOCIAL HISTORY: History   Social History  . Marital Status: Widowed    Spouse Name: N/A    Number of Children: 2  . Years of Education: 8   Occupational History  .    Marland Kitchen Retired    Social History Main Topics  . Smoking status: Never Smoker   . Smokeless tobacco: Never Used  . Alcohol Use: No  . Drug Use: No  . Sexual Activity: Not Currently   Other Topics Concern  . 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  Generalized: Well developed, obese female in no acute distress  Head: normocephalic and atraumatic,. Oropharynx benign  Neck: Supple, no carotid bruits  ,  Cardiac: Regular rate rhythm, no murmur   Neurological examination   Mentation: Alert oriented to time, place, history taking. Follows all commands speech and language fluent.   Cranial nerve II-XII: .Pupils were equal round reactive to light extraocular movements were full, visual field were full on confrontational test. Facial sensation and strength were normal. hearing was intact to finger rubbing bilaterally. Uvula tongue midline. head turning and shoulder shrug were normal and symmetric.Tongue protrusion into cheek strength was normal. Motor: normal bulk and tone, full strength in the BUE, BLE, No focal weakness Sensory: Length dependent decreased fine touch, pinprick to mid shin level bilaterally, decreased vibratory to toes, preserved proprioception Coordination: finger-nose-finger, heel-to-shin bilaterally, no dysmetria Reflexes: Brachioradialis 2/2, biceps 2/2, triceps 2/2, patellar 2/2, Achilles trace,  plantar responses were flexor bilaterally. Gait and Station: Rising up from seated position without assistance, normal stance,  moderate stride, good arm swing, smooth turning, able to perform tiptoe, and heel walking without difficulty. Tandem gait is mildly unsteady.  DIAGNOSTIC DATA (LABS, IMAGING, TESTING) - I reviewed patient records, labs, notes, testing and imaging myself where available.  Lab Results  Component Value Date   WBC 6.3 07/01/2013   HGB 13.4 07/01/2013   HCT 38.3 07/01/2013   MCV 92.7 07/01/2013   PLT 135* 07/01/2013      Component Value Date/Time   NA 142 07/01/2013 1324   K 4.1 07/01/2013 1324   CL 105 07/01/2013 1324   CO2 26  07/01/2013 1324   GLUCOSE 87 07/01/2013 1324   BUN 12 07/01/2013 1324   CREATININE 0.81 07/01/2013 1324   CREATININE 0.9 10/11/2012 1038   CALCIUM 9.2 07/01/2013 1324   PROT 6.5 07/01/2013 1324   ALBUMIN 4.4 07/01/2013 1324   AST 20 07/01/2013 1324   ALT 22 07/01/2013 1324   ALKPHOS 72 07/01/2013 1324   BILITOT 0.5 07/01/2013 1324   GFRNONAA >60 07/15/2009  0550   GFRAA  Value: >60        The eGFR has been calculated using the MDRD equation. This calculation has not been validated in all clinical situations. eGFR's persistently <60 mL/min signify possible Chronic Kidney Disease. 07/15/2009 0550     ASSESSMENT AND PLAN  73 y.o. 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 [place] [phone]

## 2013-07-04 ENCOUNTER — Encounter: Payer: Self-pay | Admitting: Internal Medicine

## 2013-07-04 ENCOUNTER — Encounter: Payer: Self-pay | Admitting: Diagnostic Radiology

## 2013-07-05 ENCOUNTER — Telehealth (INDEPENDENT_AMBULATORY_CARE_PROVIDER_SITE_OTHER): Payer: Self-pay

## 2013-07-05 NOTE — Telephone Encounter (Signed)
Pt made aware chest CT was clear of any spreading cancer.

## 2013-07-09 DIAGNOSIS — K869 Disease of pancreas, unspecified: Secondary | ICD-10-CM | POA: Diagnosis not present

## 2013-07-10 ENCOUNTER — Ambulatory Visit (HOSPITAL_COMMUNITY): Payer: Medicare Other | Admitting: Certified Registered Nurse Anesthetist

## 2013-07-10 ENCOUNTER — Other Ambulatory Visit: Payer: Self-pay | Admitting: Gastroenterology

## 2013-07-10 ENCOUNTER — Encounter (HOSPITAL_COMMUNITY): Payer: Self-pay

## 2013-07-10 ENCOUNTER — Encounter (HOSPITAL_COMMUNITY): Payer: Medicare Other | Admitting: Certified Registered Nurse Anesthetist

## 2013-07-10 ENCOUNTER — Encounter (HOSPITAL_COMMUNITY)
Admission: RE | Disposition: A | Discharge status: Home / Self Care | Payer: Self-pay | Source: Ambulatory Visit | Attending: Gastroenterology

## 2013-07-10 ENCOUNTER — Ambulatory Visit (HOSPITAL_COMMUNITY)
Admission: RE | Admit: 2013-07-10 | Discharge: 2013-07-10 | Disposition: A | Discharge status: Home / Self Care | Payer: Medicare Other | Source: Ambulatory Visit | Attending: Gastroenterology | Admitting: Gastroenterology

## 2013-07-10 DIAGNOSIS — K219 Gastro-esophageal reflux disease without esophagitis: Secondary | ICD-10-CM | POA: Diagnosis not present

## 2013-07-10 DIAGNOSIS — C801 Malignant (primary) neoplasm, unspecified: Secondary | ICD-10-CM | POA: Insufficient documentation

## 2013-07-10 DIAGNOSIS — Z79899 Other long term (current) drug therapy: Secondary | ICD-10-CM | POA: Insufficient documentation

## 2013-07-10 DIAGNOSIS — Z7982 Long term (current) use of aspirin: Secondary | ICD-10-CM | POA: Insufficient documentation

## 2013-07-10 DIAGNOSIS — Z96659 Presence of unspecified artificial knee joint: Secondary | ICD-10-CM | POA: Insufficient documentation

## 2013-07-10 DIAGNOSIS — C250 Malignant neoplasm of head of pancreas: Secondary | ICD-10-CM | POA: Diagnosis not present

## 2013-07-10 DIAGNOSIS — D379 Neoplasm of uncertain behavior of digestive organ, unspecified: Secondary | ICD-10-CM | POA: Diagnosis not present

## 2013-07-10 DIAGNOSIS — E039 Hypothyroidism, unspecified: Secondary | ICD-10-CM | POA: Diagnosis not present

## 2013-07-10 DIAGNOSIS — I10 Essential (primary) hypertension: Secondary | ICD-10-CM | POA: Diagnosis not present

## 2013-07-10 DIAGNOSIS — R109 Unspecified abdominal pain: Secondary | ICD-10-CM | POA: Diagnosis not present

## 2013-07-10 DIAGNOSIS — R933 Abnormal findings on diagnostic imaging of other parts of digestive tract: Secondary | ICD-10-CM | POA: Diagnosis not present

## 2013-07-10 HISTORY — DX: Malignant (primary) neoplasm, unspecified: C80.1

## 2013-07-10 HISTORY — DX: Other amnesia: R41.3

## 2013-07-10 HISTORY — DX: Cardiac murmur, unspecified: R01.1

## 2013-07-10 HISTORY — PX: FINE NEEDLE ASPIRATION: SHX5430

## 2013-07-10 HISTORY — PX: EUS: SHX5427

## 2013-07-10 SURGERY — ESOPHAGEAL ENDOSCOPIC ULTRASOUND (EUS) RADIAL
Anesthesia: Monitor Anesthesia Care

## 2013-07-10 MED ORDER — LIDOCAINE HCL (CARDIAC) 20 MG/ML IV SOLN
INTRAVENOUS | Status: DC | PRN
  Administered 2013-07-10: 100 mg via INTRAVENOUS

## 2013-07-10 MED ORDER — LACTATED RINGERS IV SOLN
INTRAVENOUS | Status: DC
Start: 2013-07-10 — End: 2013-07-10
  Administered 2013-07-10: 10:00:00 via INTRAVENOUS

## 2013-07-10 MED ORDER — PROPOFOL 10 MG/ML IV BOLUS
INTRAVENOUS | Status: AC
  Filled 2013-07-10: qty 20

## 2013-07-10 MED ORDER — PROPOFOL INFUSION 10 MG/ML OPTIME
INTRAVENOUS | Status: DC | PRN
  Administered 2013-07-10: 200 ug/kg/min via INTRAVENOUS

## 2013-07-10 MED ORDER — ONDANSETRON HCL 4 MG/2ML IJ SOLN
INTRAMUSCULAR | Status: DC | PRN
  Administered 2013-07-10: 4 mg via INTRAVENOUS

## 2013-07-10 MED ORDER — BUTAMBEN-TETRACAINE-BENZOCAINE 2-2-14 % EX AERO
INHALATION_SPRAY | CUTANEOUS | Status: DC | PRN
  Administered 2013-07-10: 2 via TOPICAL

## 2013-07-10 MED ORDER — MIDAZOLAM HCL 2 MG/2ML IJ SOLN
INTRAMUSCULAR | Status: AC
  Filled 2013-07-10: qty 2

## 2013-07-10 MED ORDER — PROMETHAZINE HCL 25 MG/ML IJ SOLN: 6.2500 mg | INTRAMUSCULAR | Status: DC | PRN

## 2013-07-10 MED ORDER — KETAMINE HCL 10 MG/ML IJ SOLN
INTRAMUSCULAR | Status: DC | PRN
  Administered 2013-07-10: 20 mg via INTRAVENOUS

## 2013-07-10 MED ORDER — LIDOCAINE HCL (CARDIAC) 20 MG/ML IV SOLN
INTRAVENOUS | Status: AC
  Filled 2013-07-10: qty 5

## 2013-07-10 MED ORDER — ONDANSETRON HCL 4 MG/2ML IJ SOLN
INTRAMUSCULAR | Status: AC
  Filled 2013-07-10: qty 2

## 2013-07-10 MED ORDER — SODIUM CHLORIDE 0.9 % IV SOLN: INTRAVENOUS | Status: DC

## 2013-07-10 MED ORDER — MIDAZOLAM HCL 5 MG/5ML IJ SOLN
INTRAMUSCULAR | Status: DC | PRN
  Administered 2013-07-10: 2 mg via INTRAVENOUS

## 2013-07-10 NOTE — Transfer of Care (Signed)
Immediate Anesthesia Transfer of Care Note  Patient: [name]  Procedure(s) Performed: Procedure(s) (LRB): ESOPHAGEAL ENDOSCOPIC ULTRASOUND (EUS) RADIAL (N/A) FINE NEEDLE ASPIRATION (FNA) LINEAR (N/A)  Patient Location: PACU  Anesthesia Type: MAC  Level of Consciousness: sedated, patient cooperative and responds to stimulation  Airway & Oxygen Therapy: Patient Spontanous Breathing and Patient connected to face mask oxgen  Post-op Assessment: Report given to PACU RN and Post -op Vital signs reviewed and stable  Post vital signs: Reviewed and stable  Complications: No apparent anesthesia complications

## 2013-07-10 NOTE — Anesthesia Preprocedure Evaluation (Signed)
Anesthesia Evaluation  Patient identified by MRN, date of birth, ID band Patient awake  General Assessment Comment:Pancreatic cancer with MRI scan 06-19-13  Reviewed: Allergy & Precautions, H&P , NPO status , Patient's Chart, lab work & pertinent test results  Airway Mallampati: II TM Distance: >3 FB Neck ROM: Full    Dental no notable dental hx.    Pulmonary neg pulmonary ROS,  breath sounds clear to auscultation  Pulmonary exam normal       Cardiovascular hypertension, Pt. on medications and Pt. on home beta blockers Rhythm:Regular Rate:Normal     Neuro/Psych negative neurological ROS  negative psych ROS   GI/Hepatic negative GI ROS, Neg liver ROS,   Endo/Other  negative endocrine ROS  Renal/GU negative Renal ROS  negative genitourinary   Musculoskeletal negative musculoskeletal ROS (+)   Abdominal   Peds negative pediatric ROS (+)  Hematology negative hematology ROS (+)   Anesthesia Other Findings   Reproductive/Obstetrics negative OB ROS                           Anesthesia Physical Anesthesia Plan  ASA: III  Anesthesia Plan: MAC   Post-op Pain Management:    Induction: Intravenous  Airway Management Planned: Nasal Cannula  Additional Equipment:   Intra-op Plan:   Post-operative Plan:   Informed Consent: I have reviewed the patients History and Physical, chart, labs and discussed the procedure including the risks, benefits and alternatives for the proposed anesthesia with the patient or authorized representative who has indicated his/her understanding and acceptance.   Dental advisory given  Plan Discussed with: CRNA and Surgeon  Anesthesia Plan Comments:         Anesthesia Quick Evaluation

## 2013-07-10 NOTE — H&P (View-Only) (Signed)
Chief Complaint  Patient presents with  . New Evaluation    eval pancreatic lesion    HISTORY: Pt is a 72 yo F who presents with around 6 months of abdominal pain.  She denies significant weight loss.  Her weight has been up and down.  She continues to have a good appetite overall, but food doesn't sound as good as before.  She is not diabetic.  She has had some diarrhea on and off.  She denies constipation.  She describes the pain as an ache.  It is moderate in intensity.  Not a lot helps with the pain.  She denies jaundice or dark urine.  She has recently had a UTI.   She has been off balance since her back and knee surgery.  An ultrasound was ordered to evaluate her abdominal pain.  She had a distended gallbladder, and she had a suspected lesion in her pancreatic head.  MRI confirmed this.  She has had some nosebleeds, but these have resolved.    Past Medical History  Diagnosis Date  . Benign neoplasm of colon   . Osteoarthritis   . Hiatal hernia   . Diarrhea   . Back pain   . Chronic maxillary sinusitis   . Depression   . Eustachian tube dysfunction   . Tardive dyskinesia   . Mitral valve prolapse   . Osteoarthritis   . GERD (gastroesophageal reflux disease)   . Allergic rhinitis   . Glaucoma   . Hypothyroid   . Hypertension   . Heart murmur     hx. "MVP" -predental antibiotics  . Memory loss   . Cancer     Pancreatic cancer with MRI scan 06-19-13    Past Surgical History  Procedure Laterality Date  . Knee surgery Left     x 5  . 1 baker cyst removed    . Dilation and curettage of uterus      x3  . Lumbar spine surgery      x2  . Abdominal hysterectomy    . Lumbar spine surgery      cyst  . Breast surgery      left 2 timew  . Joint replacement      LTKA  . Back surgery      fusion  . Blepharoplasty Bilateral     with cataract surgery  . Eye surgery Right     cataract    Current Outpatient Prescriptions  Medication Sig Dispense Refill  . aspirin 81 MG  tablet Take 81 mg by mouth daily.        . carboxymethylcellulose (REFRESH TEARS) 0.5 % SOLN Place 1 drop into the right eye 2 (two) times daily.       . cholecalciferol (VITAMIN D) 1000 UNITS tablet Take 1,000 Units by mouth 2 (two) times daily.       . cyanocobalamin 2000 MCG tablet Take 2,000 mcg by mouth daily.      . moxifloxacin (VIGAMOX) 0.5 % ophthalmic solution Place 1 drop into the left eye 2 (two) times daily.       . Multiple Vitamins-Minerals (ICAPS) CAPS Take 1 capsule by mouth daily.       . UNABLE TO FIND Apply 1 application topically 2 (two) times daily. Doxep/Amanta/DXM/Lidocaine 25 mg for her feet      . vitamin E 400 UNIT capsule Take 800 Units by mouth 2 (two) times daily.       . Ascorbic Acid (VITAMIN C) 1000 MG   tablet Take 1,000 mg by mouth 2 (two) times daily.      . atenolol (TENORMIN) 50 MG tablet Take 25 mg by mouth every morning.      . Calcium Carbonate-Vitamin D (CALTRATE 600+D PO) Take 1 tablet by mouth 2 (two) times daily.      . cephALEXin (KEFLEX) 250 MG capsule Take 250 mg by mouth 2 (two) times daily. Started 14 day supply 06/20/2013      . docusate sodium (COLACE) 100 MG capsule Take 200 mg by mouth at bedtime.      . gabapentin (NEURONTIN) 300 MG capsule Take 300 mg by mouth 2 (two) times daily.      . hydrOXYzine (VISTARIL) 25 MG capsule Take 25 mg by mouth at bedtime.      . levothyroxine (SYNTHROID, LEVOTHROID) 175 MCG tablet Take 175 mcg by mouth daily before breakfast.      . loratadine (CLARITIN) 10 MG tablet Take 10 mg by mouth daily.      . LORazepam (ATIVAN) 0.5 MG tablet Take 0.5 mg by mouth every 8 (eight) hours.      . meclizine (ANTIVERT) 25 MG tablet Take 12.5 mg by mouth 3 (three) times daily as needed for dizziness.      . Multiple Vitamin (MULTIVITAMIN WITH MINERALS) TABS tablet Take 1 tablet by mouth daily.      . omeprazole (PRILOSEC OTC) 20 MG tablet Take 20 mg by mouth daily.      . polycarbophil (FIBERCON) 625 MG tablet Take 1,250 mg  by mouth at bedtime.      . promethazine (PHENERGAN) 25 MG tablet Take 12.5 mg by mouth every 6 (six) hours as needed for nausea or vomiting.      . topiramate (TOPAMAX) 200 MG tablet Take 200 mg by mouth 2 (two) times daily.       No current facility-administered medications for this visit.     Allergies  Allergen Reactions  . Celecoxib Other (See Comments)    unknown  . Glucosamine     unknown  . Metoclopramide Hcl Other (See Comments)    shaking  . Niacin Other (See Comments)    shaking  . Phenazopyridine Hcl Other (See Comments)    unknown  . Sulfonamide Derivatives Nausea And Vomiting  . Trovan [Alatrofloxacin Mesylate]   . Benzocaine-Resorcinol Rash  . Sulfa Antibiotics Rash     Family History  Problem Relation Age of Onset  . Cancer Mother     Breast Cancer with Metastatic disease  . Alzheimer's disease Father      History   Social History  . Marital Status: Widowed    Spouse Name: N/A    Number of Children: 2  . Years of Education: N/A   Occupational History  .     Social History Main Topics  . Smoking status: Never Smoker   . Smokeless tobacco: Never Used  . Alcohol Use: No  . Drug Use: No  . Sexual Activity: Not Currently   Other Topics Concern  . 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  Well nourished and well groomed.   Neurological: Alert and  oriented to person, place, and time. Slightly unsteady Head: Normocephalic and atraumatic.  Eyes: Conjunctivae are normal. Pupils are equal, round, and reactive to light. No scleral icterus.  Neck: Normal range of motion. Neck supple. No tracheal deviation or thyromegaly present.  Cardiovascular: Normal rate, regular rhythm, normal heart sounds and intact distal pulses.  Exam reveals no gallop and no friction rub.  No murmur heard. Respiratory: Effort normal.  No respiratory distress. No chest wall tenderness. Breath sounds normal.  No wheezes, rales or rhonchi.  GI: Soft. Bowel sounds are normal. The abdomen is soft and nontender.  There is no rebound and no guarding.  Musculoskeletal: Normal range of motion. Extremities are nontender.  Lymphadenopathy: No cervical, preauricular, postauricular or axillary adenopathy is present Skin: Skin is warm and dry. No rash noted. No diaphoresis. No erythema. No pallor. No clubbing, cyanosis, or edema.   Psychiatric: Normal mood and affect. Behavior is normal. Judgment and thought content normal.    LABORATORY RESULTS: Available labs are reviewed   Recent Results (from the past 2160 hour(s))  COMPREHENSIVE METABOLIC PANEL     Status: None   Collection Time    07/01/13  1:24 PM      Result Value Range   Sodium 142  135 - 145 mEq/L   Potassium 4.1  3.5 - 5.3 mEq/L   Chloride 105  96 - 112 mEq/L   CO2 26  19 - 32 mEq/L   Glucose, Bld 87  70 - 99 mg/dL   BUN 12  6 - 23 mg/dL   Creat 0.81  0.50 - 1.10 mg/dL   Total Bilirubin 0.5  0.2 - 1.2 mg/dL   Comment: ** Please note change in reference range(s). **   Alkaline Phosphatase 72  39 - 117 U/L   AST 20  0 - 37 U/L   ALT 22  0 - 35 U/L   Total Protein 6.5  6.0 - 8.3 g/dL   Albumin 4.4  3.5 - 5.2 g/dL   Calcium 9.2  8.4 - 10.5 mg/dL  CBC WITH DIFFERENTIAL     Status: Abnormal   Collection Time    07/01/13  1:24 PM      Result Value Range   WBC 6.3  4.0 - 10.5 K/uL   RBC 4.13  3.87 - 5.11 MIL/uL    Hemoglobin 13.4  12.0 - 15.0 g/dL   HCT 38.3  36.0 - 46.0 %   MCV 92.7  78.0 - 100.0 fL   MCH 32.4  26.0 - 34.0 pg   MCHC 35.0  30.0 - 36.0 g/dL   RDW 13.4  11.5 - 15.5 %   Platelets 135 (*) 150 - 400 K/uL   Neutrophils Relative % 65  43 - 77 %   Neutro Abs 4.1  1.7 - 7.7 K/uL   Lymphocytes Relative 26  12 - 46 %   Lymphs Abs 1.7  0.7 - 4.0 K/uL   Monocytes Relative 6  3 - 12 %   Monocytes Absolute 0.4  0.1 - 1.0 K/uL  Eosinophils Relative 3  0 - 5 %   Eosinophils Absolute 0.2  0.0 - 0.7 K/uL   Basophils Relative 0  0 - 1 %   Basophils Absolute 0.0  0.0 - 0.1 K/uL   Smear Review Criteria for review not met       RADIOLOGY RESULTS: See E-Chart or I-Site for most recent results.  Images and reports are reviewed.  Ct Chest W Contrast  07/01/2013   CLINICAL DATA:  Findings concerning for pancreatic mass on prior MRI, history of weight loss  EXAM: CT CHEST WITH CONTRAST  TECHNIQUE: Multidetector CT imaging of the chest was performed during intravenous contrast administration.  CONTRAST:  66m OMNIPAQUE IOHEXOL 300 MG/ML  SOLN  COMPARISON:  DG CHEST 2 VIEW dated 10/11/2012  FINDINGS: The thoracic inlet is unremarkable. Subcentimeter axillary lymph nodes are identified.  There is no evidence of mediastinal nor hilar adenopathy nor masses. Atherosclerotic calcifications are appreciated within coronary vessels.  There is no evidence of pulmonary nodules, masses, infiltrates, effusions, nor edema. Hypoventilation is appreciated within the lung bases. There is diffuse mild thickening of interstitial markings.  The visualized upper abdominal viscera demonstrate diffuse low attenuation within the visualized liver parenchyma and otherwise unremarkable.  The osseous structures demonstrate no evidence of aggressive appearing lesions.  IMPRESSION: 1. Mild diffuse interstitial prominence may reflect a component of minimal chronic interstitial changes. Hepatic steatosis. There is otherwise no focal or acute  abnormalities appreciated.   Electronically Signed   By: HMargaree MackintoshM.D.   On: 07/01/2013 13:36   Mr Abdomen W Wo Contrast  06/19/2013   CLINICAL DATA:  Abdominal pain, possible pancreatic head mass on prior exam  EXAM: MRI ABDOMEN WITHOUT AND WITH CONTRAST  TECHNIQUE: Multiplanar multisequence MR imaging of the abdomen was performed both before and after the administration of intravenous contrast.  [facility]  [address]  Results:  BUN 13 mg/dL,  Creatinine 0.7 mg/dL.  CONTRAST:  15mMULTIHANCE GADOBENATE DIMEGLUMINE 529 MG/ML IV SOLN  COMPARISON:  [place] with pancreatic ductal dilatation to 7 mm at the level of the midbody. There is abrupt pancreatic duct caliber change at the level of the pancreatic head, where a 1.5 x 1.1 cm solid mass is identified as seen on the prior exam. The mass demonstrates no hyperintensity on T1 weighted or T2 weighted images and is mildly hypo enhancing with respect to the remainder of the pancreatic parenchyma. Celiac axis, major tributaries, and superior mesenteric artery enhance with contrast without evidence for encasement. Liver, spleen, and adrenal glands are unremarkable. Possible 2 mm stone versus artifact at the neck of the gallbladder without other evidence for acute cholecystitis. No intrahepatic ductal dilatation. There is mild motion artifact on the late postcontrast images. No ascites or lymphadenopathy.  IMPRESSION: Irregular hypoenhancing mass at the head of the pancreas causing distal pancreatic ductal dilatation. The most common differential consideration would be pancreatic adenocarcinoma. Metastatic disease most typically from renal cell carcinoma, small cell lung cell carcinoma, gastrointestinal primary, breast cancer, or melanoma, may also have this appearance. Metastatic disease is felt less  likely in the absence of other evidence for intra-abdominal metastatic disease or primary neoplasm elsewhere. If the patient has any clinical abnormality suggesting a metabolically active tumor, then insulinoma or gastrinoma would be the most common entities to have this appearance. Serous cystadenoma is felt much less  likely given the appearance on today's exam.  Surgical consultation is recommended. Allowing for MRI technique, there is no evidence for major vascular encasement or local intra-abdominal metastatic disease.   Electronically Signed   By: Gretchen  Green M.D.   On: 06/19/2013 14:44      ASSESSMENT AND PLAN: Pancreatic cancer Pt has highly suspicious mass in panc head that would be most consistent with pancreatic cancer.  I recommend EUS and biopsy  It could be pancreatic neuroendocrine tumor.  Pt able to get on bed, but wobbly and uses cane.  Unsure how she would do with a whipple.  Will get chest CT, CA 19-9, CMET, CBC.  I will follow her back up after these studies.  45 min spent in evaluation, examination, counseling, and coordination of care.  >50% spent in counseling.        L  MD Surgical Oncology, General and Endocrine Surgery [facility]      Visit Diagnoses: 1. Pancreatic cancer     Primary Care Physician: Michael Norins, MD    

## 2013-07-10 NOTE — Interval H&P Note (Signed)
History and Physical Interval Note:  07/10/2013 8:41 AM  [name]  has presented today for surgery, with the diagnosis of pancreatic mass  The various methods of treatment have been discussed with the patient and family. After consideration of risks, benefits and other options for treatment, the patient has consented to  Procedure(s) with comments: ESOPHAGEAL ENDOSCOPIC ULTRASOUND (EUS) RADIAL (N/A) FINE NEEDLE ASPIRATION (FNA) LINEAR (N/A) - possible fna as a surgical intervention .  The patient's history has been reviewed, patient examined, no change in status, stable for surgery.  I have reviewed the patient's chart and labs.  Questions were answered to the patient's satisfaction.     Kewon Statler M  Assessment:  1.  Pancreatic mass.  Plan:  1.  Endoscopic ultrasound with possible fine needle aspiration. 2.  Risks (bleeding, infection, bowel perforation that could require surgery, sedation-related changes in cardiopulmonary systems), benefits (identification and possible treatment of source of symptoms, exclusion of certain causes of symptoms), and alternatives (watchful waiting, radiographic imaging studies, empiric medical treatment) of upper endoscopy with ultrasound and possible biopsies (EUS +/- FNA) were explained to patient/family in detail and patient wishes to proceed.

## 2013-07-10 NOTE — Anesthesia Postprocedure Evaluation (Signed)
  Anesthesia Post-op Note  Patient: [name]  Procedure(s) Performed: Procedure(s) (LRB): ESOPHAGEAL ENDOSCOPIC ULTRASOUND (EUS) RADIAL (N/A) FINE NEEDLE ASPIRATION (FNA) LINEAR (N/A)  Patient Location: PACU  Anesthesia Type: MAC  Level of Consciousness: awake and alert   Airway and Oxygen Therapy: Patient Spontanous Breathing  Post-op Pain: mild  Post-op Assessment: Post-op Vital signs reviewed, Patient's Cardiovascular Status Stable, Respiratory Function Stable, Patent Airway and No signs of Nausea or vomiting  Last Vitals:  Filed Vitals:   07/10/13 1120  BP: 122/59  Pulse:   Temp:   Resp: 15    Post-op Vital Signs: stable   Complications: No apparent anesthesia complications

## 2013-07-10 NOTE — Discharge Instructions (Signed)
Gastrointestinal Endoscopy Care After Refer to this sheet in the next few weeks. These instructions provide you with information on caring for yourself after your procedure. Your caregiver may also give you more specific instructions. Your treatment has been planned according to current medical practices, but problems sometimes occur. Call your caregiver if you have any problems or questions after your procedure.  HOME CARE INSTRUCTIONS  Do not drive, operate machinery, or sign important documents for 24 hours.  Avoid alcohol and hot or warm beverages for the first 24 hours after the procedure.  Only take over-the-counter or prescription medicines for pain, discomfort, or fever as directed by your caregiver. You may resume taking your normal medicines unless your caregiver tells you otherwise. Ask your caregiver when you may resume taking medicines that may cause bleeding, such as aspirin, clopidogrel, or warfarin.  You may return to your normal diet and activities on the day after your procedure, or as directed by your caregiver. Walking may help to reduce any bloated feeling in your abdomen.  Drink enough fluids to keep your urine clear or pale yellow.  You may gargle with salt water if you have a sore throat.   SEEK IMMEDIATE MEDICAL CARE IF:  You have severe nausea or vomiting.  You have severe abdominal pain, abdominal cramps that last longer than 6 hours, or abdominal swelling (distention).  You have severe shoulder or back pain.  You have trouble swallowing.  You have shortness of breath, your breathing is shallow, or you are breathing faster than normal.  You have a fever or a rapid heartbeat.  You vomit blood or material that looks like coffee grounds.  You have bloody, black, or tarry stools.   MAKE SURE YOU:  Understand these instructions.  Will watch your condition.  Will get help right away if you are not doing well or get worse.

## 2013-07-10 NOTE — Preoperative (Signed)
Beta Blockers   Reason not to administer Beta Blockers:Not Applicable 

## 2013-07-10 NOTE — Op Note (Signed)
[place], [address]   ENDOSCOPIC ULTRASOUND PROCEDURE REPORT  PATIENT: [name], [name]  MR#: [id number] BIRTHDATE: [birth year]  GENDER: Female ENDOSCOPIST: Arta Silence, MD REFERRED BY:  [name], MD PROCEDURE DATE:  07/10/2013 PROCEDURE:   Upper EUS w/FNA ASA CLASS:      Class III INDICATIONS:   1.  pancreatic mass. MEDICATIONS: MAC sedation, administered by CRNA and Cetacaine spray x 2  DESCRIPTION OF PROCEDURE:   After the risks benefits and alternatives of the procedure were  explained, informed consent was obtained. The patient was then placed in the left, lateral, decubitus postion and IV sedation was administered. Throughout the procedure, the patients blood pressure, pulse and oxygen saturations were monitored continuously.  Under direct visualization, the Pentax EUS Linear [id number]  endoscope was introduced through the mouth  and advanced to the second portion of the duodenum .  Water was used as necessary to provide an acoustic interface.  Upon completion of the imaging, water was removed and the patient was sent to the recovery room in satisfactory condition.    FINDINGS:      Well-defined round  hypoechoic mass in head of pancreas, nestled in close apposition to the portal confluence. Mass measures 13 x 45mm in size.  The mass does not appear to invade any of the portal vascular structures.  No involvement of the celiac artery or superior mesenteric artery.  No peritumoral adenopathy.  Pancreatic duct is dilated and ectatic upstream of the mass.  Mass was biopsied x 4 with 25g needle (3 for slides, and 1 sent in toto for cellblock analysis); preliminary cytology, reviewed in my presence by Dr. Nicoletta Dress, is worrisome for adenocarcinoma.   IMPRESSION:     Small mass in head of pancreas, biopsies worrisome for adenocarcinoma.  RECOMMENDATIONS:     1.  Watch for potential complications of procedure. 2.  Await final  cytology results. 3.  Pending cytology results, patient's pancreatic lesion appears surgically resectable. 4.  Will discuss with Dr. [name]. 5.  Follow-up with [facility] on as-needed basis.   _______________________________ Lorrin MaisArta Silence, MD 07/10/2013 10:42 AM   CC:

## 2013-07-11 ENCOUNTER — Encounter (HOSPITAL_COMMUNITY): Payer: Self-pay | Admitting: Gastroenterology

## 2013-07-12 ENCOUNTER — Other Ambulatory Visit: Payer: Self-pay | Admitting: Internal Medicine

## 2013-07-15 ENCOUNTER — Telehealth (INDEPENDENT_AMBULATORY_CARE_PROVIDER_SITE_OTHER): Payer: Self-pay

## 2013-07-15 NOTE — Telephone Encounter (Signed)
Pt's appointment moved from 9:30 to 11:45 on 2/17 due to inclement weather.

## 2013-07-16 ENCOUNTER — Ambulatory Visit (INDEPENDENT_AMBULATORY_CARE_PROVIDER_SITE_OTHER): Payer: Medicare Other | Admitting: General Surgery

## 2013-07-16 ENCOUNTER — Other Ambulatory Visit (INDEPENDENT_AMBULATORY_CARE_PROVIDER_SITE_OTHER): Payer: Self-pay | Admitting: General Surgery

## 2013-07-16 ENCOUNTER — Encounter (INDEPENDENT_AMBULATORY_CARE_PROVIDER_SITE_OTHER): Payer: Medicare Other | Admitting: General Surgery

## 2013-07-16 ENCOUNTER — Encounter (INDEPENDENT_AMBULATORY_CARE_PROVIDER_SITE_OTHER): Payer: Self-pay | Admitting: General Surgery

## 2013-07-16 VITALS — BP 138/80 | HR 80 | Temp 98.2°F | Resp 16 | Ht 62.0 in | Wt 163.6 lb

## 2013-07-16 DIAGNOSIS — C259 Malignant neoplasm of pancreas, unspecified: Secondary | ICD-10-CM

## 2013-07-16 NOTE — Patient Instructions (Signed)
We will make referral to cancer center for radiation oncology and medical oncology.  Follow up with me to be determined by your decision.  Let us know what you decide.

## 2013-07-16 NOTE — Assessment & Plan Note (Signed)
Understandably, patient is quite apprehensive about her diagnosis and treatment. She has had 12 surgeries in the past 5 years. She is very worried about having to have another large operation.  She is leaning toward starting with neoadjuvant chemotherapy or chemoradiation. We have made referrals to the [facility]. I will see her back after treatment. We'll schedule surgery for approximately 6 weeks after completion of chemoradiation.

## 2013-07-16 NOTE — Progress Notes (Signed)
HISTORY: Patient is a 73 year old female with a new diagnosis of pancreatic adenocarcinoma. She was found to have a low attenuation lesion in the head of the pancreas. She underwent endoscopic ultrasound and cytology was positive for adenocarcinoma. She has subsequently undergone staging with a chest CT. This was negative for evidence of metastatic disease.  Mass was noted to be in close opposition to the portal confluence. This is 13 x 12 mm. There was no evidence of invasion of any vascular structures. There is no evidence of adenopathy. Pancreatic duct was noted to be dilated, but the bile duct was not.   PERTINENT REVIEW OF SYSTEMS: Positive for abdominal/back pain.  Otherwise negative x 11.    Filed Vitals:   07/16/13 1331  BP: 138/80  Pulse: 80  Temp: 98.2 F (36.8 C)  Resp: 16   Wt Readings from Last 3 Encounters:  07/16/13 163 lb 9.6 oz (74.208 kg)  07/03/13 166 lb (75.297 kg)  07/01/13 163 lb 6.4 oz (74.118 kg)    EXAM: Head: Normocephalic and atraumatic.  Eyes:  Conjunctivae are normal. Pupils are equal, round, and reactive to light. No scleral icterus.   Resp: No respiratory distress, normal effort. Neurological: Alert and oriented to person, place, and time. Coordination normal.  Skin: Skin is warm and dry. No rash noted. No diaphoretic. No erythema. No pallor.  Psychiatric: Normal mood and affect. Normal behavior. Judgment and thought content normal.      ASSESSMENT AND PLAN:   Pancreatic cancer Understandably, patient is quite apprehensive about her diagnosis and treatment. She has had 12 surgeries in the past 5 years. She is very worried about having to have another large operation.  She is leaning toward starting with neoadjuvant chemotherapy or chemoradiation. We have made referrals to the [facility]. I will see her back after treatment. We'll schedule surgery for approximately 6 weeks after completion of chemoradiation.   Greater than 30 minutes were  spent in counseling with the patient and her son.    Milus Height, MD Surgical Oncology, [facility]  Adella Hare, MD [name] Heinz Knuckles, MD

## 2013-07-17 ENCOUNTER — Telehealth: Payer: Self-pay | Admitting: *Deleted

## 2013-07-17 NOTE — Telephone Encounter (Signed)
Spoke with patient by phone and confirmed appointments with Dr. [name] and Dr. Lisbeth Renshaw.  Contact names, phone numbers, and directions were provided.

## 2013-07-18 ENCOUNTER — Other Ambulatory Visit: Payer: Self-pay | Admitting: Internal Medicine

## 2013-07-19 DIAGNOSIS — C259 Malignant neoplasm of pancreas, unspecified: Secondary | ICD-10-CM | POA: Diagnosis not present

## 2013-07-22 ENCOUNTER — Encounter (HOSPITAL_COMMUNITY): Payer: Self-pay | Admitting: Pharmacy Technician

## 2013-07-23 ENCOUNTER — Ambulatory Visit: Payer: Medicare Other

## 2013-07-23 ENCOUNTER — Telehealth: Payer: Self-pay | Admitting: Oncology

## 2013-07-23 ENCOUNTER — Ambulatory Visit (HOSPITAL_BASED_OUTPATIENT_CLINIC_OR_DEPARTMENT_OTHER): Payer: Medicare Other | Admitting: Oncology

## 2013-07-23 ENCOUNTER — Encounter: Payer: Self-pay | Admitting: Oncology

## 2013-07-23 VITALS — BP 141/65 | HR 84 | Temp 98.8°F | Resp 18 | Ht 62.0 in | Wt 162.3 lb

## 2013-07-23 DIAGNOSIS — C259 Malignant neoplasm of pancreas, unspecified: Secondary | ICD-10-CM

## 2013-07-23 DIAGNOSIS — C250 Malignant neoplasm of head of pancreas: Secondary | ICD-10-CM

## 2013-07-23 DIAGNOSIS — R109 Unspecified abdominal pain: Secondary | ICD-10-CM

## 2013-07-23 NOTE — Progress Notes (Signed)
Met with [name] and family. Explained role of nurse navigator. Educational information provided on pancreatic cancer  [place] resources provided to patient, including SW service and support group information.  Contact names and phone numbers were provided for entire [facility] team.  Teach back method was used.  Treatment still TBD.  Patient to be discussed at GI cancer conference on 07/24/13.  No barriers to care identified at present time.

## 2013-07-23 NOTE — Progress Notes (Signed)
[facility] Patient Consult   Referring MD: Gwendolynn Merkey 73 y.o.  12/03/1940    Reason for Referral: Pancreas cancer     HPI: She reports abdominal pain for several months. She saw her gynecologist and referred for an abdominal ultrasound on 05/31/2013. There was evidence of a pancreatic head mass. She was referred for an MRI of the abdomen on 06/19/2013. A 1.5 cm mass was noted at the head of the pancreas with an abrupt change in the pancreatic duct caliber. No evidence for encasement of the vasculature. The liver and adrenal glands appeared unremarkable. No ascites or lymphadenopathy.  She was referred to Dr. Paulita Fujita and underwent an endoscopic ultrasound on 07/10/2013 a well-defined mass was noted to have the pancreas near the portal confluence. The mass did not invade any of the portal vascular structures. No adenopathy. The pancreatic duct was dilated upstream of the mass. The mass was biopsied.  The cytology ([id number]) revealed malignant cells consistent with adenocarcinoma.  She was referred to Dr. [name]. She is referred to consider neoadjuvant therapy versus proceeding directly to surgery.  A CT of the chest on 07/01/2013 revealed no evidence of pulmonary nodules.  Past Medical History  Diagnosis Date  . Benign neoplasm of colon   . Osteoarthritis   . Hiatal hernia   .  G2 P2    .    Marland Kitchen Chronic maxillary sinusitis   . Depression   . Eustachian tube dysfunction   . Tardive dyskinesia   . Mitral valve prolapse   . Osteoarthritis   . GERD (gastroesophageal reflux disease)   . Allergic rhinitis   . Glaucoma   . Hypothyroid   . Hypertension   . Heart murmur     hx. "MVP" -predental antibiotics  . Memory loss   . Cancer-adenocarcinoma on the EUS biopsy   07/10/2013     Pancreatic cancer with MRI scan 06-19-13    Past Surgical History  Procedure Laterality Date  . Knee surgery Left     x 5  . 1 baker cyst removed    . Dilation  and curettage of uterus      x3  . Lumbar spine surgery      x2  . Abdominal hysterectomy    . Lumbar spine surgery      cyst  . Breast surgery      left 2 timew  . Joint replacement      LTKA  . Back surgery      fusion  . Blepharoplasty Bilateral     with cataract surgery  . Eye surgery Right     cataract  . Eus N/A 07/10/2013    Procedure: ESOPHAGEAL ENDOSCOPIC ULTRASOUND (EUS) RADIAL;  Surgeon: Arta Silence, MD;  Location: [facility];  Service: Endoscopy;  Laterality: N/A;  . Fine needle aspiration N/A 07/10/2013    Procedure: FINE NEEDLE ASPIRATION (FNA) LINEAR;  Surgeon: Arta Silence, MD;  Location: [facility];  Service: Endoscopy;  Laterality: N/A;  possible fna    Family History  Problem Relation Age of Onset  . Cancer Mother     Breast Cancer with Metastatic disease  . Alzheimer's disease Father     Current outpatient prescriptions:acetaminophen (TYLENOL) 325 MG tablet, Take 650 mg by mouth every 6 (six) hours as needed for mild pain, moderate pain or fever., Disp: , Rfl: ;  Ascorbic Acid (VITAMIN C) 1000 MG tablet, Take 1,000 mg by mouth 2 (two) times daily.,  Disp: , Rfl: ;  aspirin 81 MG tablet, Take 81 mg by mouth daily.  , Disp: , Rfl: ;  atenolol (TENORMIN) 50 MG tablet, Take 25 mg by mouth every morning., Disp: , Rfl:  Calcium Carbonate-Vitamin D (CALTRATE 600+D PO), Take 1 tablet by mouth 2 (two) times daily., Disp: , Rfl: ;  carboxymethylcellulose (REFRESH TEARS) 0.5 % SOLN, Place 1 drop into both eyes 2 (two) times daily. , Disp: , Rfl: ;  cholecalciferol (VITAMIN D) 1000 UNITS tablet, Take 1,000 Units by mouth daily. , Disp: , Rfl: ;  cyanocobalamin 2000 MCG tablet, Take 2,000 mcg by mouth daily., Disp: , Rfl:  docusate sodium (COLACE) 100 MG capsule, Take 200 mg by mouth at bedtime., Disp: , Rfl: ;  FLUoxetine (PROZAC) 20 MG capsule, Take 1 capsule by mouth  daily, Disp: 30 capsule, Rfl: 11;  gabapentin (NEURONTIN) 100 MG capsule, Take 200 mg by mouth 4  (four) times daily. + 100 mg at night, Disp: , Rfl: ;  hydrOXYzine (VISTARIL) 25 MG capsule, Take 25 mg by mouth at bedtime., Disp: , Rfl:  levothyroxine (SYNTHROID, LEVOTHROID) 175 MCG tablet, Take 1 tablet by mouth   every day, Disp: 30 tablet, Rfl: 5;  loratadine (CLARITIN) 10 MG tablet, Take 10 mg by mouth daily., Disp: , Rfl: ;  LORazepam (ATIVAN) 0.5 MG tablet, Take 0.5 mg by mouth 3 (three) times daily. , Disp: , Rfl: ;  lovastatin (MEVACOR) 40 MG tablet, Take 1 tablet by mouth at   bedtime, Disp: 90 tablet, Rfl: 3 meclizine (ANTIVERT) 25 MG tablet, Take 25 mg by mouth every 6 (six) hours as needed for dizziness. , Disp: , Rfl: ;  moxifloxacin (VIGAMOX) 0.5 % ophthalmic solution, Place 1 drop into the left eye 3 (three) times daily. , Disp: , Rfl: ;  Multiple Vitamin (MULTIVITAMIN WITH MINERALS) TABS tablet, Take 1 tablet by mouth daily., Disp: , Rfl: ;  Multiple Vitamins-Minerals (ICAPS) CAPS, Take 1 capsule by mouth daily. , Disp: , Rfl:  omeprazole (PRILOSEC OTC) 20 MG tablet, Take 20 mg by mouth 2 (two) times daily. , Disp: , Rfl: ;  polycarbophil (FIBERCON) 625 MG tablet, Take 1,250 mg by mouth at bedtime., Disp: , Rfl: ;  promethazine (PHENERGAN) 25 MG tablet, Take 25 mg by mouth every 6 (six) hours as needed for nausea or vomiting. , Disp: , Rfl: ;  topiramate (TOPAMAX) 200 MG tablet, Take 1 tablet by mouth   twice a day, Disp: 60 tablet, Rfl: 5 UNABLE TO FIND, Apply 1 application topically 2 (two) times daily. Doxep/Amanta/DXM/Lidocaine 25 mg for her feet, Disp: , Rfl: ;  vitamin E 400 UNIT capsule, Take 800 Units by mouth 2 (two) times daily. For tartive dyskinesia, Disp: , Rfl:   Allergies:  Allergies  Allergen Reactions  . Celecoxib Other (See Comments)    unknown  . Glucosamine     unknown  . Metoclopramide Hcl Other (See Comments)    shaking  . Niacin Other (See Comments)    shaking  . 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  Phenazopyridine Hcl Other (See Comments)    unknown  . Sulfonamide Derivatives Nausea And Vomiting  . Trovan [Alatrofloxacin Mesylate] Other (See Comments)    Caused shaking and nervousness  . Benzocaine-Resorcinol Rash  . Sulfa Antibiotics Rash    Social History: She lives in [place]. She lives alone. She works as a Theme park manager. She does not use tobacco or  alcohol. She was transfused with the knee replacement surgery. No risk factor for HIV or hepatitis.  ROS:   Positives include: Fatigue, neuropathy in the feet for the past year, exertional dyspnea, nausea, occasional headaches, "blurred "vision, right abdominal pain, chronic low back pain  A complete ROS was otherwise negative.  Physical Exam:  Blood pressure 141/65, pulse 84, temperature 98.8 F (37.1 C), temperature source Oral, resp. rate 18, height _0  (1.575 m), weight 162 lb 4.8 oz (73.619 kg).  HEENT: Upper and lower denture plate, oropharynx without visible mass, neck without mass Lungs: Clear bilaterally Cardiac: Regular rate and rhythm Abdomen: No hepatosplenomegaly, no mass. Tender in the right upper abdomen. No apparent ascites.  Vascular: No leg edema Lymph nodes: No cervical, supraclavicular, axillary, or inguinal nodes Neurologic: Alert and oriented, the motor exam appears intact in the upper and lower extremities Skin: No rash Musculoskeletal: No spine tenderness Breasts: Bilateral breast without mass   LAB:  CBC  Lab Results  Component Value Date   WBC 6.3 07/01/2013   HGB 13.4 07/01/2013   HCT 38.3 07/01/2013   MCV 92.7 07/01/2013   PLT 135* 07/01/2013   NEUTROABS 4.1 07/01/2013     CMP      Component Value Date/Time   NA 142 07/01/2013 1324   K 4.1 07/01/2013 1324   CL 105 07/01/2013 1324   CO2 26 07/01/2013 1324   GLUCOSE 87 07/01/2013 1324   BUN 12 07/01/2013 1324   CREATININE 0.81 07/01/2013 1324   CREATININE 0.9 10/11/2012 1038   CALCIUM 9.2 07/01/2013 1324   PROT 6.5 07/01/2013 1324   ALBUMIN 4.4  07/01/2013 1324   AST 20 07/01/2013 1324   ALT 22 07/01/2013 1324   ALKPHOS 72 07/01/2013 1324   BILITOT 0.5 07/01/2013 1324   GFRNONAA >60 07/15/2009 0550   GFRAA  Value: >60        The eGFR has been calculated using the MDRD equation. This calculation has not been validated in all clinical situations. eGFR's persistently <60 mL/min signify possible Chronic Kidney Disease. 07/15/2009 0550     Radiology: As per history of present illness    Assessment/Plan:   1. 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 Abdominal pain-potentially related to the pancreas mass 3. Mild thrombocytopenia on a CBC 07/01/2013   Disposition:   Ms. 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 I discussed treatment options with Ms. Sutter and her son. The tumor appears resectable based on the imaging studies to date. We discussed proceeding with a Whipple procedure, a diagnostic laparoscopy, or neoadjuvant therapy.   We reviewed the rationale behind each of these approaches. She understands no therapy other than surgery can be curative.  We decided to check a CA 19-9 to help guide with our treatment decision. Her case will be presented at the GI tumor conference 07/24/2013 and we will contact her with the conference recommendation.  [place], [place] 07/23/2013, 4:27 PM

## 2013-07-23 NOTE — Telephone Encounter (Signed)
gave pt appt for MD only, sent pt to labs today

## 2013-07-23 NOTE — Patient Instructions (Signed)

## 2013-07-24 ENCOUNTER — Ambulatory Visit: Payer: Medicare Other

## 2013-07-24 ENCOUNTER — Ambulatory Visit: Payer: Medicare Other | Admitting: Oncology

## 2013-07-24 LAB — CANCER ANTIGEN 19-9: CA 19 9: 2.8 U/mL (ref <35.0)

## 2013-07-25 ENCOUNTER — Telehealth: Payer: Self-pay | Admitting: *Deleted

## 2013-07-25 NOTE — Telephone Encounter (Signed)
Spoke with patient's son.  Per Dr. [name], patient needs to see Dr. Lisbeth Renshaw post-op and can re-schedule appointment with [facility] from 07/29/13.  Patient's son expressed appreciation for the call.

## 2013-07-26 ENCOUNTER — Telehealth (INDEPENDENT_AMBULATORY_CARE_PROVIDER_SITE_OTHER): Payer: Self-pay | Admitting: General Surgery

## 2013-07-26 NOTE — Telephone Encounter (Signed)
I discussed role of CA 19-9 with son in the patient's care and the patient's result.

## 2013-07-29 ENCOUNTER — Institutional Professional Consult (permissible substitution): Payer: Medicare Other | Admitting: Radiation Oncology

## 2013-07-29 ENCOUNTER — Ambulatory Visit: Payer: Medicare Other

## 2013-07-31 ENCOUNTER — Ambulatory Visit (HOSPITAL_COMMUNITY)
Admission: RE | Admit: 2013-07-31 | Discharge: 2013-07-31 | Disposition: A | Discharge status: Home / Self Care | Payer: Medicare Other | Source: Ambulatory Visit | Attending: General Surgery | Admitting: General Surgery

## 2013-07-31 ENCOUNTER — Encounter (HOSPITAL_COMMUNITY): Payer: Self-pay

## 2013-07-31 ENCOUNTER — Telehealth (INDEPENDENT_AMBULATORY_CARE_PROVIDER_SITE_OTHER): Payer: Self-pay

## 2013-07-31 ENCOUNTER — Encounter (HOSPITAL_COMMUNITY)
Admission: RE | Admit: 2013-07-31 | Discharge: 2013-07-31 | Disposition: A | Discharge status: Home / Self Care | Payer: Medicare Other | Source: Ambulatory Visit | Attending: General Surgery | Admitting: General Surgery

## 2013-07-31 DIAGNOSIS — Z01818 Encounter for other preprocedural examination: Secondary | ICD-10-CM | POA: Diagnosis not present

## 2013-07-31 DIAGNOSIS — Z01810 Encounter for preprocedural cardiovascular examination: Secondary | ICD-10-CM | POA: Diagnosis not present

## 2013-07-31 HISTORY — DX: Urgency of urination: R39.15

## 2013-07-31 LAB — CBC WITH DIFFERENTIAL/PLATELET
BASOS ABS: 0 10*3/uL (ref 0.0–0.1)
Basophils Relative: 0 % (ref 0–1)
EOS ABS: 0.2 10*3/uL (ref 0.0–0.7)
EOS PCT: 3 % (ref 0–5)
HCT: 41.9 % (ref 36.0–46.0)
Hemoglobin: 14.6 g/dL (ref 12.0–15.0)
Lymphocytes Relative: 31 % (ref 12–46)
Lymphs Abs: 2.1 10*3/uL (ref 0.7–4.0)
MCH: 33 pg (ref 26.0–34.0)
MCHC: 34.8 g/dL (ref 30.0–36.0)
MCV: 94.8 fL (ref 78.0–100.0)
Monocytes Absolute: 0.4 10*3/uL (ref 0.1–1.0)
Monocytes Relative: 6 % (ref 3–12)
Neutro Abs: 4 10*3/uL (ref 1.7–7.7)
Neutrophils Relative %: 60 % (ref 43–77)
Platelets: 136 10*3/uL — ABNORMAL LOW (ref 150–400)
RBC: 4.42 MIL/uL (ref 3.87–5.11)
RDW: 13 % (ref 11.5–15.5)
WBC: 6.8 10*3/uL (ref 4.0–10.5)

## 2013-07-31 LAB — APTT: aPTT: 27 seconds (ref 24–37)

## 2013-07-31 LAB — COMPREHENSIVE METABOLIC PANEL
ALT: 17 U/L (ref 0–35)
AST: 19 U/L (ref 0–37)
Albumin: 4 g/dL (ref 3.5–5.2)
Alkaline Phosphatase: 94 U/L (ref 39–117)
BUN: 10 mg/dL (ref 6–23)
CALCIUM: 9.3 mg/dL (ref 8.4–10.5)
CO2: 24 mEq/L (ref 19–32)
CREATININE: 0.74 mg/dL (ref 0.50–1.10)
Chloride: 103 mEq/L (ref 96–112)
GFR calc non Af Amer: 82 mL/min — ABNORMAL LOW (ref >90)
GLUCOSE: 87 mg/dL (ref 70–99)
Potassium: 3.7 mEq/L (ref 3.7–5.3)
Sodium: 143 mEq/L (ref 137–147)
TOTAL PROTEIN: 7.6 g/dL (ref 6.0–8.3)
Total Bilirubin: 0.3 mg/dL (ref 0.3–1.2)

## 2013-07-31 LAB — PROTIME-INR
INR: 1.08 (ref 0.00–1.49)
PROTHROMBIN TIME: 13.8 s (ref 11.6–15.2)

## 2013-07-31 LAB — PREPARE RBC (CROSSMATCH)

## 2013-07-31 NOTE — Telephone Encounter (Signed)
Pt son called with pre op questions. Advised an enema was ordered to be done night before surgery. Went over medications she is to stop prior to surgery. He states she often takes imodium because she has issues with diarrhea. Asked if she could take it up until surgery date if it is needed. Explained the enema prep she is to take is because we need to clean her system out before surgery. Imodium would counter react this. Advised I will send message asking Dr [name] how close to surgery she needs to stop taking imodium and we would call patient back.

## 2013-08-01 NOTE — Progress Notes (Signed)
Anesthesia Chart Review:  Patient is a 73 year old female scheduled for diagnostic laparotomy, possible Whipple on 08/07/13 by Dr. [name].    History includes pancreatic cancer (adenocarcinoma), benign neoplasm of the colon, non-smoker, HTN, murmur with reported history of MVP (mild MR/TR by 2013 echo), GERD, hiatal hernia, hypothyroidism, memory loss, tardive dyskinesia, depression, osteoarthritis, chronic maxillary sinusitis, hysterectomy, left TKA, lumbar surgery/fusion. HEM-ONC Dr. [name]. GI Dr. Paulita Fujita. PCP is Dr. Adella Hare.  Echo on 10/11/11 showed:  - Left ventricle: The cavity size was normal. There was mild focal basal hypertrophy of the septum. Systolic function was normal. The estimated ejection fraction was in the range of 55% to 60%. Wall motion was normal; there were no regional wall motion abnormalities. Doppler parameters are consistent with abnormal left ventricular relaxation (grade 1 diastolic dysfunction). - Mitral valve: Calcified annulus. Mild regurgitation. - Pulmonic valve: Trivial regurgitation. - Tricuspid valve: Mild regurgitation.  EKG on 07/31/13 showed NSR, cannot rule out anterior infarct (age undetermined). It was not felt significantly changed from her prior tracing on 07/08/09 (see Muse).  48 hours Holter monitor in 09/2011 showed SR with no significant dysrhythmias.  She had a normal carotid duplex on 10/06/11.  CXR on 07/31/13 showed no active cardiopulmonary disease. Chest CT on 07/01/13 showed: Mild diffuse interstitial prominence may reflect a component of minimal chronic interstitial changes. Hepatic steatosis. There is otherwise no focal or acute abnormalities appreciated.  Preoperative labs noted.  PLT 136K.  AST/ALT and PT/PTT WNL. T&S done.  I anticipate that she can proceed as planned.  George Hugh [facility]/Anesthesiology Phone [phone] 08/01/2013 1:45 PM

## 2013-08-01 NOTE — Telephone Encounter (Signed)
If she has chronic diarrhea, she does not have to do enema.

## 2013-08-02 ENCOUNTER — Telehealth (INDEPENDENT_AMBULATORY_CARE_PROVIDER_SITE_OTHER): Payer: Self-pay

## 2013-08-02 NOTE — Telephone Encounter (Signed)
Pt called stating she was watching TV and saw show about surgery and loosing organs. I advised pt to try not to watch medical TV shows at this point. Pt was asked if  Dr [name] has discussed the surgery, risks and benefits with her. Pt states yes she did. I asked pt if she understands what surgery will be done and why we are doing the surgery. Pt states she does understand but that the closer the date gets the more nervous she becomes.  I advised pt that if she has any concerns and needs to speak with Dr [name] to please call our office and Dr [name] can speak with her or return her call. Pt states she will call if she has more concerns.

## 2013-08-05 ENCOUNTER — Ambulatory Visit (INDEPENDENT_AMBULATORY_CARE_PROVIDER_SITE_OTHER): Payer: Medicare Other | Admitting: General Surgery

## 2013-08-05 ENCOUNTER — Encounter (INDEPENDENT_AMBULATORY_CARE_PROVIDER_SITE_OTHER): Payer: Self-pay | Admitting: General Surgery

## 2013-08-05 VITALS — BP 126/74 | HR 75 | Temp 98.4°F | Resp 16 | Ht 62.0 in | Wt 163.0 lb

## 2013-08-05 DIAGNOSIS — C259 Malignant neoplasm of pancreas, unspecified: Secondary | ICD-10-CM

## 2013-08-05 NOTE — Telephone Encounter (Signed)
Please see if patient would like to come in at 2:45 today to discuss surgery on Wednesday.   tx FB

## 2013-08-05 NOTE — Progress Notes (Signed)
HISTORY: Patient is a 72-year-old female scheduled for a Whipple in 2 days. She had significant questions this morning and was very anxious. She was brought back to discuss surgery again and review the risk and benefits one by one.  She denies any new symptoms. She is not having any significant abdominal pain. She is getting around well. She also wanted to discuss post hospital care.   PERTINENT REVIEW OF SYSTEMS: Anxiety!!  Filed Vitals:   08/05/13 1509  BP: 126/74  Pulse: 75  Temp: 98.4 F (36.9 C)  Resp: 16   Wt Readings from Last 3 Encounters:  08/05/13 163 lb (73.936 kg)  07/23/13 162 lb 4.8 oz (73.619 kg)  07/16/13 163 lb 9.6 oz (74.208 kg)    EXAM: Pt not examined.  Entire visit spent in counseling.     ASSESSMENT AND PLAN:   Pancreatic cancer We reviewed surgery and risks/benefits.    Entire visit spent in counseling.    20 min spent discussing surgery.     We also discussed that while in the hospital, she would see physical therapy, social work, and case manager. They would be able to give her better ideas of the best way to deal with her post hospital situation since she lives alone.    Kabao Leite L Jayleigh Notarianni, MD Surgical Oncology, General & Endocrine Surgery [facility]  Michael Norins, MD Norins, Michael E, MD   

## 2013-08-05 NOTE — Patient Instructions (Signed)
WHIPPLE (PANCREATICODUODENECTOMY) THE OPERATION: The goal of the operation is to remove masses in the head of the pancreas, the distal bile duct, or the duodenum.  The structures that are removed include the head of the pancreas, the duodenum, the gallbladder, and a small portion of your stomach.  The jejunum, or middle part of the small intestine, is then used to reconnect the bile duct, the pancreas, and the stomach to the intestinal tract.  The reason so much needs to be removed is that the blood supply and lymphatic channels and nodes are closely interconnected.  The operation takes between 4 and 8 hours depending on the amount of scar tissue you have present from prior surgeries, or from the mass.    WHAT HAPPENS IN THE OPERATING ROOM: I will start the operation with a diagnostic laparoscopy.  This means we will make small incisions to see if there is visible cancer outside of the pancreas.  If there is visible spread of cancer, I will stop the operation because research has shown that survival for pancreatic cancer is worse if you undergo a big operation without being able to remove all the cancer.  If no other cancer is seen, I will make a bigger incision on your abdomen and begin mobilizing the stomach, duodenum and gallbladder to see if the tumor is able to be removed.  Sometimes the tumor is too adherent to the arteries and veins supplying the liver and small intestine that it is unsafe or not possible to remove the tumor.  If this is the case, the procedure would be stopped and the tumor would be left in place.  Whatever the operative findings, I will discuss the case with your family after we are done in the operating room.  I will talk to you in the next few days when you are more awake.  I will see you in the hospital every weekday that I am not out of town.  My partners help see patients on the weekends and if I am out of town.    WHAT HAPPENS AFTER SURGERY: After surgery, you will go  first to the recovery room, then to the ICU for careful monitoring.  It is important that I am able to know your vital signs and urine output to see how you are doing.  Depending on many factors, you may be in the ICU overnight, or for several days.  You will have many tubes and lines in place which is standard for this operation.  You will have several IVs, including possibly a central line in your chest or neck.  You will likely have an arterial line in your wrist.  You will have a tube in your nose for 4-5 days in order to suction out the stomach and liver secretions to help the surgical connections heal.  YOU WILL NOT BE ABLE TO EAT FOR AT LEAST 4-5 DAYS AFTER SURGERY.  You will have a catheter in your bladder.  On your abdomen, you will have several surgical drains and possibly a pain pump with numbing medicine.  You will have compression stockings on your legs to decrease the risk of blood clots.    Sometimes anesthesia makes a decision that you may need to be left on the breathing machine for a period after surgery.  This may be based on your overall health status, or events during surgery.    We will address your pain in several ways.  We will use an IV pain pump   called a "PCA," or Patient Controlled Analgesia.  This allows you to press a button and immediately receive a dose of pain medication without waiting for a nurse.  We also use IV Tylenol and sometimes IV Toradol which is similar to ibuprofen.  You may also have a pump with numbing medicine delivered directly to your incision.  I use doses and medications that work for the majority of people, but you may need an adjustment to the dose or type of medicine if your pain is not adequately controlled.  Your throat may be sore, in which case you may need a throat spray or lozenges.    We will ask you to get out of bed the day after surgery in order to maximize your chances of not having complications.  Your risk of pneumonia and blood clots is lower  with walking and sitting in the chair.  We will also ask you to perform breathing exercises.  We will also ask to you walk in your room and in the halls for the above reasons, but also in order for you to keep up your strength.    EATING: We will usually start you on clear liquids in around 5 days if your bowel function seems to have returned.  We advance your diet slowly to make sure you are tolerating each step.  All patients do not have a normal appetite when they go home and usually have to take 2-4 cans of nutritional supplement per day while this is improving.  Most patients also find that their taste buds do not seem the same right after surgery, and this can continue into the time of possible post operative chemotherapy and radiation.  Some patients develop diabetes and will need assistance from a primary care doctor for medication.    OTHER TREATMENT? I will present your case when the pathology is available at our GI Multidisciplinary conference which occurs every 1-2 weeks.  Medical and Radiation Oncologists are present, as well as the pathologist and radiologist in addition to myself.  If you have a larger tumor or if you have positive lymph nodes, we may have the oncologist stop by to see you while you are in the hospital.  Most firm post op treatment plans occur, however, once you are an outpatient because we will need to see how you are recovering from surgery.  GOING HOME! Usually you are able to go home in 8-14 days, depending on whether or not complications happen and what is going on with your overall health status.   If you have more health problems or if you have limited help at home, the therapists and nurses may recommend a temporary rehab or nursing facility to help you get back on your feet before you go home.  These decisions would be made while you are in the hospital with the assistance of a social worker or case manager.    Please bring all insurance/disability forms to our  office for the staff to fill out   POSSIBLE COMPLICATIONS This is a very extensive operation and includes complications listed below: Bleeding Infection and possible wound complications such as hernia Damage to adjacent structures Leak of surgical connections, the worst of which is the connection between the intestine and the pancreas (20%) Possible need for other procedures, such as abscess drains in radiology or endoscopy.   Possible prolonged hospital stay Possible development of diabetes or worsening of current diabetes. (5-20%) Possible diarrhea from lack of pancreatic enzymes.   Prolonged   fatigue/weakness/appetite MOST PATIENTS' ENERGY LEVEL IS NOT BACK TO NORMAL FOR AT LEAST 4-6 MONTHS.  OLDER PATIENTS MAY FEEL WEAK FOR LONGER PERIODS OF TIME.   Difficulty with eating or post operative nausea (around 30%) Possible early recurrence of cancer Possible complications of your medical problems such as heart disease or arrhythmias. Death (less than 2%)  All possible complications are not listed, just the most common.    FURTHER INFORMATION? Please ask questions if you find something that we did not discuss in the office and would like more information.  If you would like another appointment if you have many questions or if your family members would like to come as well, please contact the office.     IF YOU ARE TAKING ASPIRIN, PLAVIX, COUMADIN, OR OTHER BLOOD THINNERS, LET US KNOW IMMEDIATELY SO WE CAN CONTACT YOUR PRESCRIBING HEALTH CARE PROVIDER TO HOLD THE MEDICATION FOR 5-7 DAYS BEFORE SURGERY  

## 2013-08-05 NOTE — Assessment & Plan Note (Signed)
We reviewed surgery and risks/benefits.    Entire visit spent in counseling.    20 min spent discussing surgery.

## 2013-08-06 MED ORDER — ERTAPENEM SODIUM 1 G IJ SOLR
1.0000 g | INTRAMUSCULAR | Status: AC
  Administered 2013-08-07: 1 g via INTRAVENOUS
  Filled 2013-08-06: qty 1

## 2013-08-07 ENCOUNTER — Encounter (HOSPITAL_COMMUNITY)
Admission: RE | Disposition: A | Discharge status: Skilled Nursing Facility | Payer: Self-pay | Source: Ambulatory Visit | Attending: General Surgery

## 2013-08-07 ENCOUNTER — Encounter (HOSPITAL_COMMUNITY): Payer: Self-pay | Admitting: *Deleted

## 2013-08-07 ENCOUNTER — Inpatient Hospital Stay (HOSPITAL_COMMUNITY): Payer: Medicare Other | Admitting: Certified Registered"

## 2013-08-07 ENCOUNTER — Inpatient Hospital Stay (HOSPITAL_COMMUNITY)
Admission: RE | Admit: 2013-08-07 | Discharge: 2013-09-12 | DRG: 406 | Disposition: A | Discharge status: Skilled Nursing Facility | Payer: Medicare Other | Source: Ambulatory Visit | Attending: General Surgery | Admitting: General Surgery

## 2013-08-07 ENCOUNTER — Encounter (HOSPITAL_COMMUNITY): Payer: Medicare Other | Admitting: Vascular Surgery

## 2013-08-07 DIAGNOSIS — R209 Unspecified disturbances of skin sensation: Secondary | ICD-10-CM | POA: Diagnosis not present

## 2013-08-07 DIAGNOSIS — E440 Moderate protein-calorie malnutrition: Secondary | ICD-10-CM | POA: Diagnosis not present

## 2013-08-07 DIAGNOSIS — G2401 Drug induced subacute dyskinesia: Secondary | ICD-10-CM | POA: Diagnosis not present

## 2013-08-07 DIAGNOSIS — K219 Gastro-esophageal reflux disease without esophagitis: Secondary | ICD-10-CM | POA: Diagnosis not present

## 2013-08-07 DIAGNOSIS — S298XXA Other specified injuries of thorax, initial encounter: Secondary | ICD-10-CM | POA: Diagnosis not present

## 2013-08-07 DIAGNOSIS — R7309 Other abnormal glucose: Secondary | ICD-10-CM | POA: Diagnosis not present

## 2013-08-07 DIAGNOSIS — E46 Unspecified protein-calorie malnutrition: Secondary | ICD-10-CM | POA: Diagnosis not present

## 2013-08-07 DIAGNOSIS — K639 Disease of intestine, unspecified: Secondary | ICD-10-CM | POA: Diagnosis not present

## 2013-08-07 DIAGNOSIS — E039 Hypothyroidism, unspecified: Secondary | ICD-10-CM | POA: Diagnosis present

## 2013-08-07 DIAGNOSIS — C259 Malignant neoplasm of pancreas, unspecified: Secondary | ICD-10-CM | POA: Diagnosis not present

## 2013-08-07 DIAGNOSIS — E876 Hypokalemia: Secondary | ICD-10-CM | POA: Diagnosis not present

## 2013-08-07 DIAGNOSIS — I10 Essential (primary) hypertension: Secondary | ICD-10-CM | POA: Diagnosis not present

## 2013-08-07 DIAGNOSIS — Z48815 Encounter for surgical aftercare following surgery on the digestive system: Secondary | ICD-10-CM | POA: Diagnosis not present

## 2013-08-07 DIAGNOSIS — G608 Other hereditary and idiopathic neuropathies: Secondary | ICD-10-CM | POA: Diagnosis not present

## 2013-08-07 DIAGNOSIS — R079 Chest pain, unspecified: Secondary | ICD-10-CM | POA: Diagnosis not present

## 2013-08-07 DIAGNOSIS — K319 Disease of stomach and duodenum, unspecified: Secondary | ICD-10-CM | POA: Diagnosis not present

## 2013-08-07 DIAGNOSIS — R110 Nausea: Secondary | ICD-10-CM | POA: Diagnosis not present

## 2013-08-07 DIAGNOSIS — M6289 Other specified disorders of muscle: Secondary | ICD-10-CM | POA: Diagnosis not present

## 2013-08-07 DIAGNOSIS — R339 Retention of urine, unspecified: Secondary | ICD-10-CM | POA: Diagnosis not present

## 2013-08-07 DIAGNOSIS — D72829 Elevated white blood cell count, unspecified: Secondary | ICD-10-CM | POA: Diagnosis not present

## 2013-08-07 DIAGNOSIS — C250 Malignant neoplasm of head of pancreas: Secondary | ICD-10-CM | POA: Diagnosis not present

## 2013-08-07 DIAGNOSIS — R112 Nausea with vomiting, unspecified: Secondary | ICD-10-CM | POA: Diagnosis not present

## 2013-08-07 DIAGNOSIS — K3189 Other diseases of stomach and duodenum: Secondary | ICD-10-CM | POA: Diagnosis not present

## 2013-08-07 DIAGNOSIS — M6281 Muscle weakness (generalized): Secondary | ICD-10-CM | POA: Diagnosis not present

## 2013-08-07 DIAGNOSIS — R262 Difficulty in walking, not elsewhere classified: Secondary | ICD-10-CM | POA: Diagnosis not present

## 2013-08-07 DIAGNOSIS — W19XXXA Unspecified fall, initial encounter: Secondary | ICD-10-CM | POA: Diagnosis not present

## 2013-08-07 DIAGNOSIS — K30 Functional dyspepsia: Secondary | ICD-10-CM

## 2013-08-07 DIAGNOSIS — K2940 Chronic atrophic gastritis without bleeding: Secondary | ICD-10-CM | POA: Diagnosis not present

## 2013-08-07 DIAGNOSIS — R55 Syncope and collapse: Secondary | ICD-10-CM | POA: Diagnosis not present

## 2013-08-07 HISTORY — PX: WHIPPLE PROCEDURE: SHX2667

## 2013-08-07 HISTORY — PX: LAPAROSCOPY: SHX197

## 2013-08-07 LAB — GLUCOSE, CAPILLARY
GLUCOSE-CAPILLARY: 133 mg/dL — AB (ref 70–99)
Glucose-Capillary: 139 mg/dL — ABNORMAL HIGH (ref 70–99)
Glucose-Capillary: 145 mg/dL — ABNORMAL HIGH (ref 70–99)

## 2013-08-07 LAB — CREATININE, SERUM
CREATININE: 0.64 mg/dL (ref 0.50–1.10)
GFR, EST NON AFRICAN AMERICAN: 86 mL/min — AB (ref >90)

## 2013-08-07 LAB — PREPARE RBC (CROSSMATCH)

## 2013-08-07 LAB — CBC
HCT: 36.4 % (ref 36.0–46.0)
Hemoglobin: 12.7 g/dL (ref 12.0–15.0)
MCH: 33 pg (ref 26.0–34.0)
MCHC: 34.9 g/dL (ref 30.0–36.0)
MCV: 94.5 fL (ref 78.0–100.0)
Platelets: 126 10*3/uL — ABNORMAL LOW (ref 150–400)
RBC: 3.85 MIL/uL — ABNORMAL LOW (ref 3.87–5.11)
RDW: 13.5 % (ref 11.5–15.5)
WBC: 12.7 10*3/uL — AB (ref 4.0–10.5)

## 2013-08-07 LAB — HEMOGLOBIN A1C
Hgb A1c MFr Bld: 5.2 % (ref <5.7)
MEAN PLASMA GLUCOSE: 103 mg/dL (ref <117)

## 2013-08-07 SURGERY — LAPAROSCOPY, DIAGNOSTIC
Anesthesia: General | Site: Abdomen

## 2013-08-07 MED ORDER — MORPHINE SULFATE (PF) 1 MG/ML IV SOLN
INTRAVENOUS | Status: DC
  Administered 2013-08-07: 2 mg via INTRAVENOUS
  Administered 2013-08-07: 8 mg via INTRAVENOUS
  Administered 2013-08-07: 14:00:00 via INTRAVENOUS
  Administered 2013-08-08: 9 mg via INTRAVENOUS
  Administered 2013-08-08: 21 mg via INTRAVENOUS
  Administered 2013-08-08: 09:00:00 via INTRAVENOUS
  Administered 2013-08-08: 1 mg via INTRAVENOUS
  Administered 2013-08-08: 23:00:00 via INTRAVENOUS
  Administered 2013-08-08: 11 mg via INTRAVENOUS
  Administered 2013-08-08: 4 mg via INTRAVENOUS
  Administered 2013-08-08: 2 mg via INTRAVENOUS
  Administered 2013-08-09: 9 mg via INTRAVENOUS
  Administered 2013-08-09: 13:00:00 via INTRAVENOUS
  Administered 2013-08-09: 3 mg via INTRAVENOUS
  Administered 2013-08-09: 8 mg via INTRAVENOUS
  Filled 2013-08-07 (×3): qty 25

## 2013-08-07 MED ORDER — FENTANYL CITRATE 0.05 MG/ML IJ SOLN
INTRAMUSCULAR | Status: AC
Start: 2013-08-07 — End: 2013-08-07
  Filled 2013-08-07: qty 5

## 2013-08-07 MED ORDER — ONDANSETRON HCL 4 MG PO TABS: 4.0000 mg | ORAL_TABLET | Freq: Four times a day (QID) | ORAL | Status: DC | PRN

## 2013-08-07 MED ORDER — FENTANYL CITRATE 0.05 MG/ML IJ SOLN
50.0000 ug | Freq: Once | INTRAMUSCULAR | Status: DC
Start: 2013-08-07 — End: 2013-08-07

## 2013-08-07 MED ORDER — ALBUMIN HUMAN 5 % IV SOLN
INTRAVENOUS | Status: DC | PRN
  Administered 2013-08-07: 12:00:00 via INTRAVENOUS

## 2013-08-07 MED ORDER — BUPIVACAINE-EPINEPHRINE PF 0.25-1:200000 % IJ SOLN
INTRAMUSCULAR | Status: DC | PRN
  Administered 2013-08-07: 10:00:00

## 2013-08-07 MED ORDER — EVICEL 5 ML EX KIT
PACK | CUTANEOUS | Status: AC
  Filled 2013-08-07: qty 1

## 2013-08-07 MED ORDER — NEOSTIGMINE METHYLSULFATE 1 MG/ML IJ SOLN
INTRAMUSCULAR | Status: AC
  Filled 2013-08-07: qty 10

## 2013-08-07 MED ORDER — SODIUM CHLORIDE 0.9 % IJ SOLN: 9.0000 mL | INTRAMUSCULAR | Status: DC | PRN

## 2013-08-07 MED ORDER — LORAZEPAM 2 MG/ML IJ SOLN: 0.5000 mg | Freq: Three times a day (TID) | INTRAMUSCULAR | Status: DC | PRN

## 2013-08-07 MED ORDER — PROPOFOL 10 MG/ML IV BOLUS
INTRAVENOUS | Status: AC
  Filled 2013-08-07: qty 20

## 2013-08-07 MED ORDER — FENTANYL CITRATE 0.05 MG/ML IJ SOLN
INTRAMUSCULAR | Status: DC | PRN
  Administered 2013-08-07: 50 ug via INTRAVENOUS
  Administered 2013-08-07: 100 ug via INTRAVENOUS
  Administered 2013-08-07 (×6): 50 ug via INTRAVENOUS

## 2013-08-07 MED ORDER — SUCCINYLCHOLINE CHLORIDE 20 MG/ML IJ SOLN
INTRAMUSCULAR | Status: AC
  Filled 2013-08-07: qty 1

## 2013-08-07 MED ORDER — BUPIVACAINE 0.25 % ON-Q PUMP DUAL CATH 300 ML
INJECTION | Status: DC | PRN
  Administered 2013-08-07: 300 mL

## 2013-08-07 MED ORDER — ONDANSETRON HCL 4 MG/2ML IJ SOLN
4.0000 mg | Freq: Four times a day (QID) | INTRAMUSCULAR | Status: DC | PRN
  Administered 2013-08-10 – 2013-08-25 (×24): 4 mg via INTRAVENOUS
  Filled 2013-08-07 (×24): qty 2

## 2013-08-07 MED ORDER — OXYCODONE HCL 5 MG/5ML PO SOLN: 5.0000 mg | Freq: Once | ORAL | Status: DC | PRN

## 2013-08-07 MED ORDER — METOPROLOL TARTRATE 1 MG/ML IV SOLN
2.5000 mg | Freq: Four times a day (QID) | INTRAVENOUS | Status: DC
  Administered 2013-08-07 – 2013-08-21 (×57): 2.5 mg via INTRAVENOUS
  Administered 2013-08-22: 18:00:00 via INTRAVENOUS
  Administered 2013-08-22 – 2013-08-30 (×32): 2.5 mg via INTRAVENOUS
  Filled 2013-08-07 (×103): qty 5

## 2013-08-07 MED ORDER — MORPHINE SULFATE (PF) 1 MG/ML IV SOLN
INTRAVENOUS | Status: AC
  Filled 2013-08-07: qty 25

## 2013-08-07 MED ORDER — HYDROMORPHONE HCL PF 1 MG/ML IJ SOLN
INTRAMUSCULAR | Status: AC
  Filled 2013-08-07: qty 1

## 2013-08-07 MED ORDER — ONDANSETRON HCL 4 MG/2ML IJ SOLN
INTRAMUSCULAR | Status: DC | PRN
  Administered 2013-08-07: 4 mg via INTRAVENOUS

## 2013-08-07 MED ORDER — ROCURONIUM BROMIDE 50 MG/5ML IV SOLN
INTRAVENOUS | Status: AC
  Filled 2013-08-07: qty 1

## 2013-08-07 MED ORDER — BUPIVACAINE-EPINEPHRINE (PF) 0.25% -1:200000 IJ SOLN
INTRAMUSCULAR | Status: AC
  Filled 2013-08-07: qty 30

## 2013-08-07 MED ORDER — MIDAZOLAM HCL 2 MG/2ML IJ SOLN
INTRAMUSCULAR | Status: AC
  Filled 2013-08-07: qty 2

## 2013-08-07 MED ORDER — OXYCODONE HCL 5 MG PO TABS: 5.0000 mg | ORAL_TABLET | Freq: Once | ORAL | Status: DC | PRN

## 2013-08-07 MED ORDER — PHENYLEPHRINE 40 MCG/ML (10ML) SYRINGE FOR IV PUSH (FOR BLOOD PRESSURE SUPPORT)
PREFILLED_SYRINGE | INTRAVENOUS | Status: AC
  Filled 2013-08-07: qty 10

## 2013-08-07 MED ORDER — INSULIN ASPART 100 UNIT/ML ~~LOC~~ SOLN
0.0000 [IU] | SUBCUTANEOUS | Status: DC
Start: 2013-08-07 — End: 2013-08-16
  Administered 2013-08-07: 1 [IU] via SUBCUTANEOUS
  Administered 2013-08-07: 2 [IU] via SUBCUTANEOUS
  Administered 2013-08-08 (×2): 1 [IU] via SUBCUTANEOUS
  Administered 2013-08-08: 2 [IU] via SUBCUTANEOUS
  Administered 2013-08-08 – 2013-08-09 (×5): 1 [IU] via SUBCUTANEOUS
  Administered 2013-08-10 (×2): 2 [IU] via SUBCUTANEOUS
  Administered 2013-08-10 – 2013-08-16 (×19): 1 [IU] via SUBCUTANEOUS

## 2013-08-07 MED ORDER — 0.9 % SODIUM CHLORIDE (POUR BTL) OPTIME
TOPICAL | Status: DC | PRN
  Administered 2013-08-07 (×3): 1000 mL

## 2013-08-07 MED ORDER — STERILE WATER FOR IRRIGATION IR SOLN
Status: DC | PRN
  Administered 2013-08-07: 1000 mL

## 2013-08-07 MED ORDER — EPHEDRINE SULFATE 50 MG/ML IJ SOLN
INTRAMUSCULAR | Status: AC
  Filled 2013-08-07: qty 1

## 2013-08-07 MED ORDER — MORPHINE SULFATE 2 MG/ML IJ SOLN
1.0000 mg | INTRAMUSCULAR | Status: DC | PRN
  Administered 2013-08-08: 1 mg via INTRAVENOUS
  Filled 2013-08-07: qty 1

## 2013-08-07 MED ORDER — LEVOTHYROXINE SODIUM 100 MCG IV SOLR
100.0000 ug | Freq: Every day | INTRAVENOUS | Status: DC
  Administered 2013-08-07 – 2013-08-30 (×23): 100 ug via INTRAVENOUS
  Filled 2013-08-07 (×31): qty 5

## 2013-08-07 MED ORDER — GATIFLOXACIN 0.5 % OP SOLN
1.0000 [drp] | Freq: Four times a day (QID) | OPHTHALMIC | Status: DC
  Administered 2013-08-07 – 2013-08-21 (×32): 1 [drp] via OPHTHALMIC
  Filled 2013-08-07 (×3): qty 2.5

## 2013-08-07 MED ORDER — MIDAZOLAM HCL 5 MG/5ML IJ SOLN
INTRAMUSCULAR | Status: DC | PRN
  Administered 2013-08-07 (×2): 1 mg via INTRAVENOUS

## 2013-08-07 MED ORDER — LIDOCAINE HCL (CARDIAC) 20 MG/ML IV SOLN
INTRAVENOUS | Status: AC
  Filled 2013-08-07: qty 5

## 2013-08-07 MED ORDER — ROCURONIUM BROMIDE 100 MG/10ML IV SOLN
INTRAVENOUS | Status: DC | PRN
  Administered 2013-08-07: 40 mg via INTRAVENOUS
  Administered 2013-08-07: 20 mg via INTRAVENOUS
  Administered 2013-08-07: 10 mg via INTRAVENOUS

## 2013-08-07 MED ORDER — KCL IN DEXTROSE-NACL 20-5-0.45 MEQ/L-%-% IV SOLN
INTRAVENOUS | Status: AC
  Administered 2013-08-07 – 2013-08-08 (×2): 100 mL/h via INTRAVENOUS
  Administered 2013-08-08: 05:00:00 via INTRAVENOUS
  Administered 2013-08-09: 1 mL via INTRAVENOUS
  Administered 2013-08-09 – 2013-08-13 (×5): via INTRAVENOUS
  Filled 2013-08-07 (×19): qty 1000

## 2013-08-07 MED ORDER — SODIUM CHLORIDE 0.9 % IV SOLN
1.0000 g | INTRAVENOUS | Status: AC
  Administered 2013-08-08: 1 g via INTRAVENOUS
  Filled 2013-08-07: qty 1

## 2013-08-07 MED ORDER — DIPHENHYDRAMINE HCL 50 MG/ML IJ SOLN
12.5000 mg | Freq: Four times a day (QID) | INTRAMUSCULAR | Status: DC | PRN
  Administered 2013-08-09: 12.5 mg via INTRAVENOUS
  Filled 2013-08-07: qty 1

## 2013-08-07 MED ORDER — LORAZEPAM BOLUS VIA INFUSION: 0.5000 mg | Freq: Three times a day (TID) | INTRAVENOUS | Status: DC | PRN

## 2013-08-07 MED ORDER — FLEET ENEMA 7-19 GM/118ML RE ENEM: 1.0000 | ENEMA | Freq: Once | RECTAL | Status: DC

## 2013-08-07 MED ORDER — CHLORHEXIDINE GLUCONATE 4 % EX LIQD: 1.0000 "application " | Freq: Once | CUTANEOUS | Status: DC

## 2013-08-07 MED ORDER — HYDROMORPHONE HCL PF 1 MG/ML IJ SOLN
0.2500 mg | INTRAMUSCULAR | Status: DC | PRN
  Administered 2013-08-07 (×2): 0.5 mg via INTRAVENOUS

## 2013-08-07 MED ORDER — BUPIVACAINE 0.25 % ON-Q PUMP DUAL CATH 300 ML
300.0000 mL | INJECTION | Status: DC
  Filled 2013-08-07: qty 300

## 2013-08-07 MED ORDER — PANTOPRAZOLE SODIUM 40 MG IV SOLR
40.0000 mg | Freq: Every day | INTRAVENOUS | Status: DC
  Administered 2013-08-07 – 2013-08-22 (×15): 40 mg via INTRAVENOUS
  Filled 2013-08-07 (×18): qty 40

## 2013-08-07 MED ORDER — ONDANSETRON HCL 4 MG/2ML IJ SOLN
4.0000 mg | Freq: Four times a day (QID) | INTRAMUSCULAR | Status: DC | PRN
Start: 2013-08-07 — End: 2013-08-09

## 2013-08-07 MED ORDER — HYDRALAZINE HCL 20 MG/ML IJ SOLN: 10.0000 mg | INTRAMUSCULAR | Status: DC | PRN

## 2013-08-07 MED ORDER — LACTATED RINGERS IV SOLN
INTRAVENOUS | Status: DC | PRN
  Administered 2013-08-07 (×3): via INTRAVENOUS

## 2013-08-07 MED ORDER — SODIUM CHLORIDE 0.9 % IJ SOLN
INTRAMUSCULAR | Status: AC
  Filled 2013-08-07: qty 10

## 2013-08-07 MED ORDER — MIDAZOLAM HCL 2 MG/2ML IJ SOLN: 1.0000 mg | INTRAMUSCULAR | Status: DC | PRN

## 2013-08-07 MED ORDER — CARBOXYMETHYLCELLULOSE SODIUM 0.5 % OP SOLN: 1.0000 [drp] | Freq: Two times a day (BID) | OPHTHALMIC | Status: DC

## 2013-08-07 MED ORDER — PHENYLEPHRINE HCL 10 MG/ML IJ SOLN
10.0000 mg | INTRAVENOUS | Status: DC | PRN
  Administered 2013-08-07: 10 ug/min via INTRAVENOUS

## 2013-08-07 MED ORDER — ARTIFICIAL TEARS OP OINT
TOPICAL_OINTMENT | OPHTHALMIC | Status: DC | PRN
  Administered 2013-08-07: 1 via OPHTHALMIC

## 2013-08-07 MED ORDER — EVICEL 5 ML EX KIT
PACK | CUTANEOUS | Status: DC | PRN
  Administered 2013-08-07: 5 mL

## 2013-08-07 MED ORDER — DIPHENHYDRAMINE HCL 12.5 MG/5ML PO ELIX
12.5000 mg | ORAL_SOLUTION | Freq: Four times a day (QID) | ORAL | Status: DC | PRN
  Filled 2013-08-07: qty 5

## 2013-08-07 MED ORDER — ACETAMINOPHEN 10 MG/ML IV SOLN
1000.0000 mg | Freq: Four times a day (QID) | INTRAVENOUS | Status: AC
  Administered 2013-08-07 – 2013-08-08 (×3): 1000 mg via INTRAVENOUS
  Filled 2013-08-07 (×4): qty 100

## 2013-08-07 MED ORDER — POLYVINYL ALCOHOL 1.4 % OP SOLN
1.0000 [drp] | Freq: Two times a day (BID) | OPHTHALMIC | Status: DC
Start: 2013-08-07 — End: 2013-09-12
  Administered 2013-08-07 – 2013-09-12 (×65): 1 [drp] via OPHTHALMIC
  Filled 2013-08-07: qty 15

## 2013-08-07 MED ORDER — BUPIVACAINE ON-Q PAIN PUMP (FOR ORDER SET NO CHG)
INJECTION | Status: AC
  Filled 2013-08-07: qty 1

## 2013-08-07 MED ORDER — HEPARIN SODIUM (PORCINE) 5000 UNIT/ML IJ SOLN
5000.0000 [IU] | Freq: Three times a day (TID) | INTRAMUSCULAR | Status: DC
  Administered 2013-08-08 – 2013-09-12 (×105): 5000 [IU] via SUBCUTANEOUS
  Filled 2013-08-07 (×124): qty 1

## 2013-08-07 MED ORDER — NALOXONE HCL 0.4 MG/ML IJ SOLN: 0.4000 mg | INTRAMUSCULAR | Status: DC | PRN

## 2013-08-07 MED ORDER — PROPOFOL 10 MG/ML IV BOLUS
INTRAVENOUS | Status: DC | PRN
  Administered 2013-08-07: 100 mg via INTRAVENOUS

## 2013-08-07 MED ORDER — LIDOCAINE HCL (CARDIAC) 20 MG/ML IV SOLN
INTRAVENOUS | Status: DC | PRN
  Administered 2013-08-07: 60 mg via INTRAVENOUS

## 2013-08-07 MED ORDER — LIDOCAINE HCL (PF) 1 % IJ SOLN
INTRAMUSCULAR | Status: AC
  Filled 2013-08-07: qty 30

## 2013-08-07 MED ORDER — GLYCOPYRROLATE 0.2 MG/ML IJ SOLN
INTRAMUSCULAR | Status: AC
  Filled 2013-08-07: qty 3

## 2013-08-07 SURGICAL SUPPLY — 122 items
BLADE SURG 11 STRL SS (BLADE) ×3 IMPLANT
BLADE SURG ROTATE 9660 (MISCELLANEOUS) IMPLANT
BOOT SUTURE AID YELLOW STND (SUTURE) ×6 IMPLANT
CANISTER SUCTION 2500CC (MISCELLANEOUS) ×3 IMPLANT
CATH KIT ON Q 5IN SLV (PAIN MANAGEMENT) ×6 IMPLANT
CATH KIT ON Q 7.5IN SLV (PAIN MANAGEMENT) IMPLANT
CATH ROBINSON RED A/P 12FR (CATHETERS) ×3 IMPLANT
CATH ROBINSON RED A/P 16FR (CATHETERS) IMPLANT
CHLORAPREP W/TINT 26ML (MISCELLANEOUS) ×3 IMPLANT
CLIP LIGATING HEM O LOK PURPLE (MISCELLANEOUS) ×6 IMPLANT
CLIP LIGATING HEMO O LOK GREEN (MISCELLANEOUS) ×6 IMPLANT
CLIP LIGATING HEMOLOK MED (MISCELLANEOUS) ×6 IMPLANT
CLIP TI LARGE 6 (CLIP) ×6 IMPLANT
CLIP TI MEDIUM 24 (CLIP) ×3 IMPLANT
CONT SPEC 4OZ CLIKSEAL STRL BL (MISCELLANEOUS) ×6 IMPLANT
COVER SURGICAL LIGHT HANDLE (MISCELLANEOUS) ×3 IMPLANT
DECANTER SPIKE VIAL GLASS SM (MISCELLANEOUS) IMPLANT
DERMABOND ADVANCED (GAUZE/BANDAGES/DRESSINGS) ×2
DERMABOND ADVANCED .7 DNX12 (GAUZE/BANDAGES/DRESSINGS) ×1 IMPLANT
DRAIN CHANNEL 19F RND (DRAIN) ×6 IMPLANT
DRAIN PENROSE 1/2X36 STERILE (WOUND CARE) ×3 IMPLANT
DRAPE LAPAROSCOPIC ABDOMINAL (DRAPES) ×3 IMPLANT
DRAPE UTILITY 15X26 W/TAPE STR (DRAPE) ×6 IMPLANT
DRAPE WARM FLUID 44X44 (DRAPE) ×3 IMPLANT
DRESSING ALLEVYN LIFE SACRUM (GAUZE/BANDAGES/DRESSINGS) ×3 IMPLANT
DRESSING TELFA 8X3 (GAUZE/BANDAGES/DRESSINGS) IMPLANT
DRSG COVADERM 4X10 (GAUZE/BANDAGES/DRESSINGS) ×6 IMPLANT
DRSG COVADERM 4X14 (GAUZE/BANDAGES/DRESSINGS) IMPLANT
DRSG COVADERM 4X6 (GAUZE/BANDAGES/DRESSINGS) IMPLANT
DRSG COVADERM 4X8 (GAUZE/BANDAGES/DRESSINGS) IMPLANT
ELECT BLADE 4.0 EZ CLEAN MEGAD (MISCELLANEOUS) ×3
ELECT BLADE 6.5 EXT (BLADE) ×3 IMPLANT
ELECT CAUTERY BLADE 6.4 (BLADE) ×3 IMPLANT
ELECT PAD DSPR THERM+ ADLT (MISCELLANEOUS) ×3 IMPLANT
ELECT REM PT RETURN 9FT ADLT (ELECTROSURGICAL) ×3
ELECTRODE BLDE 4.0 EZ CLN MEGD (MISCELLANEOUS) ×1 IMPLANT
ELECTRODE REM PT RTRN 9FT ADLT (ELECTROSURGICAL) ×1 IMPLANT
EVACUATOR SILICONE 100CC (DRAIN) ×6 IMPLANT
GAUZE SPONGE 4X4 16PLY XRAY LF (GAUZE/BANDAGES/DRESSINGS) IMPLANT
GLOVE BIO SURGEON STRL SZ 6 (GLOVE) ×9 IMPLANT
GLOVE BIO SURGEON STRL SZ7.5 (GLOVE) ×9 IMPLANT
GLOVE BIO SURGEON STRL SZ8 (GLOVE) ×6 IMPLANT
GLOVE BIO SURGEON STRL SZ8.5 (GLOVE) ×3 IMPLANT
GLOVE BIOGEL PI IND STRL 6.5 (GLOVE) ×2 IMPLANT
GLOVE BIOGEL PI IND STRL 7.0 (GLOVE) ×1 IMPLANT
GLOVE BIOGEL PI IND STRL 7.5 (GLOVE) ×2 IMPLANT
GLOVE BIOGEL PI INDICATOR 6.5 (GLOVE) ×4
GLOVE BIOGEL PI INDICATOR 7.0 (GLOVE) ×2
GLOVE BIOGEL PI INDICATOR 7.5 (GLOVE) ×4
GLOVE SS BIOGEL STRL SZ 6.5 (GLOVE) ×1 IMPLANT
GLOVE SUPERSENSE BIOGEL SZ 6.5 (GLOVE) ×2
GOWN STRL REIN XL XLG (GOWN DISPOSABLE) ×6 IMPLANT
GOWN STRL REUS W/ TWL LRG LVL3 (GOWN DISPOSABLE) ×4 IMPLANT
GOWN STRL REUS W/ TWL XL LVL3 (GOWN DISPOSABLE) ×2 IMPLANT
GOWN STRL REUS W/TWL 2XL LVL3 (GOWN DISPOSABLE) ×3 IMPLANT
GOWN STRL REUS W/TWL LRG LVL3 (GOWN DISPOSABLE) ×8
GOWN STRL REUS W/TWL XL LVL3 (GOWN DISPOSABLE) ×4
HAND PENCIL TRP OPTION (MISCELLANEOUS) IMPLANT
HEMOSTAT SURGICEL 2X14 (HEMOSTASIS) IMPLANT
KIT BASIN OR (CUSTOM PROCEDURE TRAY) ×3 IMPLANT
KIT ROOM TURNOVER OR (KITS) ×3 IMPLANT
LOOP VESSEL MAXI BLUE (MISCELLANEOUS) ×3 IMPLANT
NEEDLE BIOPSY 14X6 SOFT TISS (NEEDLE) IMPLANT
NS IRRIG 1000ML POUR BTL (IV SOLUTION) ×9 IMPLANT
PACK GENERAL/GYN (CUSTOM PROCEDURE TRAY) ×3 IMPLANT
PAD ARMBOARD 7.5X6 YLW CONV (MISCELLANEOUS) ×6 IMPLANT
PAD SHARPS MAGNETIC DISPOSAL (MISCELLANEOUS) IMPLANT
PLUG CATH AND CAP STER (CATHETERS) IMPLANT
RELOAD PROXIMATE 75MM BLUE (ENDOMECHANICALS) ×6 IMPLANT
SCISSORS LAP 5X35 DISP (ENDOMECHANICALS) IMPLANT
SEPRAFILM PROCEDURAL PACK 3X5 (MISCELLANEOUS) IMPLANT
SET IRRIG TUBING LAPAROSCOPIC (IRRIGATION / IRRIGATOR) IMPLANT
SHEARS FOC LG CVD HARMONIC 17C (MISCELLANEOUS) ×3 IMPLANT
SLEEVE ENDOPATH XCEL 5M (ENDOMECHANICALS) ×3 IMPLANT
SPECIMEN JAR LARGE (MISCELLANEOUS) ×3 IMPLANT
SPONGE GAUZE 4X4 12PLY (GAUZE/BANDAGES/DRESSINGS) ×3 IMPLANT
SPONGE INTESTINAL PEANUT (DISPOSABLE) IMPLANT
SPONGE LAP 18X18 X RAY DECT (DISPOSABLE) ×12 IMPLANT
SPONGE SURGIFOAM ABS GEL 100 (HEMOSTASIS) IMPLANT
STAPLER PROXIMATE 75MM BLUE (STAPLE) ×3 IMPLANT
STAPLER VISISTAT 35W (STAPLE) ×6 IMPLANT
SUCTION POOLE TIP (SUCTIONS) ×3 IMPLANT
SUT 5.0 PDS RB-1 (SUTURE) ×10
SUT ETHILON 2 0 FS 18 (SUTURE) ×6 IMPLANT
SUT ETHILON 2 LR (SUTURE) ×3 IMPLANT
SUT MNCRL AB 4-0 PS2 18 (SUTURE) ×3 IMPLANT
SUT PDS AB 1 TP1 96 (SUTURE) ×9 IMPLANT
SUT PDS AB 3-0 SH 27 (SUTURE) ×18 IMPLANT
SUT PDS AB 4-0 RB1 27 (SUTURE) ×24 IMPLANT
SUT PDS PLUS AB 5-0 RB-1 (SUTURE) ×5 IMPLANT
SUT PROLENE 3 0 SH 48 (SUTURE) ×6 IMPLANT
SUT PROLENE 4 0 RB 1 (SUTURE) ×4
SUT PROLENE 4 0 SH DA (SUTURE) ×6 IMPLANT
SUT PROLENE 4-0 RB1 .5 CRCL 36 (SUTURE) ×2 IMPLANT
SUT PROLENE 5 0 RB 1 DA (SUTURE) ×6 IMPLANT
SUT SILK 2 0 SH CR/8 (SUTURE) ×12 IMPLANT
SUT SILK 2 0 TIES 10X30 (SUTURE) ×3 IMPLANT
SUT SILK 3 0 SH CR/8 (SUTURE) ×6 IMPLANT
SUT SILK 3 0 TIES 10X30 (SUTURE) ×3 IMPLANT
SUT VIC AB 2-0 CT1 27 (SUTURE)
SUT VIC AB 2-0 CT1 TAPERPNT 27 (SUTURE) IMPLANT
SUT VIC AB 2-0 SH 18 (SUTURE) ×3 IMPLANT
SUT VIC AB 3-0 SH 18 (SUTURE) ×3 IMPLANT
SUT VIC AB 3-0 SH 27 (SUTURE) ×2
SUT VIC AB 3-0 SH 27X BRD (SUTURE) ×1 IMPLANT
SUT VIC AB 4-0 RB1 18 (SUTURE) ×3 IMPLANT
SUT VICRYL AB 2 0 TIES (SUTURE) IMPLANT
SYRINGE TOOMEY DISP (SYRINGE) ×3 IMPLANT
TAPE UMBILICAL 1/8 X36 TWILL (MISCELLANEOUS) ×3 IMPLANT
TOWEL OR 17X24 6PK STRL BLUE (TOWEL DISPOSABLE) IMPLANT
TOWEL OR 17X26 10 PK STRL BLUE (TOWEL DISPOSABLE) ×3 IMPLANT
TOWEL OR NON WOVEN STRL DISP B (DISPOSABLE) ×9 IMPLANT
TRAY FOLEY CATH 14FRSI W/METER (CATHETERS) ×3 IMPLANT
TRAY LAPAROSCOPIC (CUSTOM PROCEDURE TRAY) ×3 IMPLANT
TROCAR XCEL BLUNT TIP 100MML (ENDOMECHANICALS) ×3 IMPLANT
TROCAR XCEL NON-BLD 11X100MML (ENDOMECHANICALS) IMPLANT
TROCAR XCEL NON-BLD 5MMX100MML (ENDOMECHANICALS) ×3 IMPLANT
TUBE FEEDING 5FR 15 INCH (TUBING) ×3 IMPLANT
TUBE FEEDING 8FR 16IN STR KANG (MISCELLANEOUS) ×3 IMPLANT
TUNNELER SHEATH ON-Q 16GX12 DP (PAIN MANAGEMENT) ×3 IMPLANT
WATER STERILE IRR 1000ML POUR (IV SOLUTION) ×3 IMPLANT
YANKAUER SUCT BULB TIP NO VENT (SUCTIONS) ×3 IMPLANT

## 2013-08-07 NOTE — Transfer of Care (Signed)
Immediate Anesthesia Transfer of Care Note  Patient: [name]  Procedure(s) Performed: Procedure(s): LAPAROSCOPY DIAGNOSTIC (N/A) WHIPPLE PROCEDURE (N/A)  Patient Location: PACU  Anesthesia Type:General  Level of Consciousness: awake, alert  and sedated  Airway & Oxygen Therapy: Patient connected to face mask oxygen  Post-op Assessment: Report given to PACU RN  Post vital signs: stable  Complications: No apparent anesthesia complications

## 2013-08-07 NOTE — H&P (View-Only) (Signed)
HISTORY: Patient is a 73 year old female scheduled for a Whipple in 2 days. She had significant questions this morning and was very anxious. She was brought back to discuss surgery again and review the risk and benefits one by one.  She denies any new symptoms. She is not having any significant abdominal pain. She is getting around well. She also wanted to discuss post hospital care.   PERTINENT REVIEW OF SYSTEMS: Anxiety!!  Filed Vitals:   08/05/13 1509  BP: 126/74  Pulse: 75  Temp: 98.4 F (36.9 C)  Resp: 16   Wt Readings from Last 3 Encounters:  08/05/13 163 lb (73.936 kg)  07/23/13 162 lb 4.8 oz (73.619 kg)  07/16/13 163 lb 9.6 oz (74.208 kg)    EXAM: Pt not examined.  Entire visit spent in counseling.     ASSESSMENT AND PLAN:   Pancreatic cancer We reviewed surgery and risks/benefits.    Entire visit spent in counseling.    20 min spent discussing surgery.     We also discussed that while in the hospital, she would see physical therapy, social work, and Tourist information centre manager. They would be able to give her better ideas of the best way to deal with her post hospital situation since she lives alone.    Milus Height, MD Surgical Oncology, [facility]  Adella Hare, MD [name] Heinz Knuckles, MD

## 2013-08-07 NOTE — Op Note (Signed)
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    POSTOPERATIVE DIAGNOSIS: Same.   PROCEDURES PERFORMED:  Diagnostic laparoscopy  Classic pancreaticoduodenectomy   Placement of biliary and pancreatic stent    SURGEON: [name], MD   ASSISTANT: Kaylyn Lim, MD   ANESTHESIA: General and epidural   FINDINGS: 2-3 cm pancreatic head mass. firm pancreatic tissue. 6 mm common bile duct. 4 mm pancreatic duct  SPECIMENS:  1. Pancreaticoduodenectomy with gallbladder:  2. portal nodes  3.  Additional pancreatic margins  ESTIMATED BLOOD LOSS: 100 mL.   COMPLICATIONS: None known.   PROCEDURE:   Pt was identified in the holding area and taken to  the operating room, and placed supine on the operating room  table. General anesthesia was induced. The patient's abdomen was  prepped and draped in a sterile fashion, after a Foley catheter was  placed. A time-out was performed according to the surgical safety check  list. When all was correct we continued.   The supraumbilical skin was anesthetized with local anesthetic.  A vertical incision was made in this location.  The fascia was elevated with Kocher clamps and incised vertically.  A 0-0 vicryl pursestring suture was placed around the fascial incision.  The Hasson trocar was inserted into the abdomen and secured in place with the tails of the suture.  The abdomen was insufflated with carbon dioxide.  The abdomen was examined.  A second port was placed in the right upper quadrant to be able to better visualize the right liver.  No evidence of metastatic disease was seen.    A midline incision was made from the xiphoid to just above the umbilicus. This was extended laterally toward the right. The subcutaneous tissues were divided with the Bovie cautery. The peritoneum was entered in the center of the abdomen. Digital retraction was then used to elevate the preperitoneal fat, and  this was taken with the cautery as well. The  subcutaneous tissues and  fascia of the muscular layers were taken laterally with the cautery.  Care was taken to protect the underlying viscera. Once this had been performed, the point of the incision was reflected up to the skin to  facilitate retraction.   Bookwalter self-retaining retractor was placed  for visualization. The right colon was taken down off of the white line  of Toldt and from the retroperitoneum at the hepatic flexure. The porta was identified. The  duodenum was kocherized extensively with blunt dissection and with cautery. The gallbladder was taken off the liver with a combination of blunt dissection and cautery. The cystic duct was clipped with the Hemalock clips. The cystic duct was divided and the gallbladder was passed off. Several portal lymph nodes were dissected out with the harmonic scalpel.    The common bile duct was skeletonized near the duodenum. A vessel loop was passed around it. The gastroduodenal artery, as well as the common hepatic artery were skeletonized. The proper hepatic artery was traced out to make sure that flow was going to both sides of the liver when the GDA was clamped. The GDA was test clamped with the bulldog, with good flow to the liver and  no signs of ischemia. This was divided with 2-0 silk ties and then clipped. The proper hepatic artery was reflected upward, and the anterior portal vein was exposed.  A Kelly clamp was passed underneath the pancreas at the superior mesenteric vein, and this passed  easily with no signs of tumor involvement.   Attention was  then directed to the stomach, and the omentum was taken  off of the stomach at the border of the antrum and the body. The  gastrohepatic ligament was taken down with the harmonic, and care was  taken to make sure there was not a replaced left hepatic artery in this  location. The stomach was divided with the GIA-75 stapler. The border  of the stomach was oversewn with a 3-0 running PDS  suture.   Attention was then directed to the small bowel. Around 10 cm past the  ligament of Treitz it was located, and this was divided with the 75-GIA.  The distal portion of the jejunum was also oversewn with a 3-0 PDS  suture. The fourth portion of the duodenum was skeletonized with the  harmonic scalpel, taking down all of the mesenteric vessels. The  ligament of Treitz was taken down. The IMV was preserved.  The duodenum was then passed underneath the portal vein.   At this point the Claiborne Billings was replaced and the pancreas was divided with the cautery. The Bovie was used to coagulate the small bleeders at the border of the pancreas.  The Overholt in combination with the harmonic and locking Weck clips  were then used to take the uncinate process off of the portal vein and  the superior mesenteric artery. Care was taken not to incorporate the  superior mesenteric artery in the dissection. The specimen was then marked and passed off the table for frozen section margin.  The pancreas was further mobilized off the portal vein and additional pancreatic margin was taken.    The jejunum was then passed through a defect in the colon mesentery in order to get appropriate lie for the pancreatic and biliary  anastomoses. The more distal portion of the jejunum was pulled up over  the colon, and two 3-0 silks were placed through the posterior border  of the stomach for the gastrojejunostomy. The stomach and the small  bowel were opened, and a GIA-75 was used to create an end-to-end  anastomosis. The open areas of the staple line were examined to ensure  that there was hemostasis. The defect was then closed with a single  layer of running Connell suture of 3-0 PDS. Prior to a complete  closure, the NG tube was passed toward the afferent limb.   The appropriate location for the choledochojejunostomy was identified, and  the small bowel was opened approximately 6 mm. The anastamosis was created with  approximately seven 4-0 interrupted PDS sutures.   An 8 Fr feeding tube was used as a biliary stent.  The 2 corner sutures were placed first and then the posterior layer was done in an interrupted fashion tying on  the outside. The superior layer was then closed with interrupted sutures as  well.   At this point the frozens returned back as all negative. The pancreatic  anastomosis was then created by opening the jejunal muscular wall the length  of the pancreatic parenchyma. The mucosa was opened around 4 mm.  The pancreas was firm, and the duct was 4 mm. A pediatric feeding tube was used as a pancreatic stent. The posterior layer was formed first with 2-0 silk sutures in interrupted fashion. A duct to mucosal anastamosis was created with the pancreas. The anterior layer was then oversewn with 2-0 silks to dunk the pancreatic parenchyma.   The areas were then irrigated and then those anastomoses were covered  with Evicel. This was allowed to dry.  The  abdomen was then irrigated  again and all the laparotomy sponges were removed. A lap count was  performed, which was correct. Two 19-Blake drains were placed, with the  lateral-most drain placed behind the choledochojejunostomy. The medial  Blake drain was placed just anterior and slightly superior to the  Pancreaticojejunostomy.  The fascia was then closed with #1 looped running PDS sutures. The skin was irrigated and then closed with  staples. The wounds were cleaned, dried and dressed with a sterile  dressing.   The patient tolerated the procedure well and was extubated and taken to  PACU in stable condition. Needle and sponge counts were correct x2.

## 2013-08-07 NOTE — Anesthesia Preprocedure Evaluation (Addendum)
Anesthesia Evaluation  Patient identified by MRN, date of birth, ID band Patient awake  General Assessment Comment:Pancreatic cancer with MRI scan 06-19-13  Reviewed: Allergy & Precautions, H&P , NPO status , Patient's Chart, lab work & pertinent test results, reviewed documented beta blocker date and time   Airway Mallampati: II TM Distance: >3 FB Neck ROM: Full    Dental no notable dental hx. (+) Edentulous Upper, Edentulous Lower, Dental Advisory Given   Pulmonary neg pulmonary ROS,  breath sounds clear to auscultation  Pulmonary exam normal       Cardiovascular hypertension, Pt. on medications and Pt. on home beta blockers + Valvular Problems/Murmurs MVP Rhythm:Regular Rate:Normal     Neuro/Psych Anxiety Depression  Neuromuscular disease negative neurological ROS  negative psych ROS   GI/Hepatic negative GI ROS, Neg liver ROS, hiatal hernia, GERD-  Medicated and Controlled,  Endo/Other  negative endocrine ROSHypothyroidism   Renal/GU negative Renal ROS  negative genitourinary   Musculoskeletal negative musculoskeletal ROS (+)   Abdominal (+)  Abdomen: soft. Bowel sounds: normal.  Peds negative pediatric ROS (+)  Hematology negative hematology ROS (+)   Anesthesia Other Findings Pancreatic ca  Reproductive/Obstetrics negative OB ROS                         Anesthesia Physical Anesthesia Plan  ASA: III  Anesthesia Plan: General   Post-op Pain Management:    Induction: Intravenous  Airway Management Planned: Oral ETT  Additional Equipment: Arterial line  Intra-op Plan:   Post-operative Plan: Possible Post-op intubation/ventilation  Informed Consent: I have reviewed the patients History and Physical, chart, labs and discussed the procedure including the risks, benefits and alternatives for the proposed anesthesia with the patient or authorized representative who has indicated his/her  understanding and acceptance.     Plan Discussed with: CRNA and Surgeon  Anesthesia Plan Comments:         Anesthesia Quick Evaluation

## 2013-08-07 NOTE — Anesthesia Postprocedure Evaluation (Signed)
  Anesthesia Post-op Note  Patient: [name]  Procedure(s) Performed: Procedure(s): LAPAROSCOPY DIAGNOSTIC (N/A) WHIPPLE PROCEDURE (N/A)  Patient Location: PACU  Anesthesia Type:General  Level of Consciousness: awake, alert  and oriented  Airway and Oxygen Therapy: Patient Spontanous Breathing and Patient connected to nasal cannula oxygen  Post-op Pain: mild  Post-op Assessment: Post-op Vital signs reviewed, Patient's Cardiovascular Status Stable, Respiratory Function Stable, Patent Airway and Pain level controlled  Post-op Vital Signs: stable  Complications: No apparent anesthesia complications 

## 2013-08-07 NOTE — Progress Notes (Signed)
Pt has red splotches on left forearm, and surrounding IV insertion site on left hand. Pt denies pain, burning, and/or itching. Harrison, and Dr. [name] notified. No new orders. Will continue with morphine PCA and monitor closely for any signs of reaction

## 2013-08-07 NOTE — Anesthesia Postprocedure Evaluation (Signed)
  Anesthesia Post-op Note  

## 2013-08-07 NOTE — Interval H&P Note (Signed)
History and Physical Interval Note:  08/07/2013 8:14 AM  [name]  has presented today for surgery, with the diagnosis of PANCREATIC CANCER  The various methods of treatment have been discussed with the patient and family. After consideration of risks, benefits and other options for treatment, the patient has consented to  Procedure(s): LAPAROSCOPY DIAGNOSTIC (N/A) POSSIBLE WHIPPLE PROCEDURE (N/A) as a surgical intervention .  The patient's history has been reviewed, patient examined, no change in status, stable for surgery.  I have reviewed the patient's chart and labs.  Questions were answered to the patient's satisfaction.     Chanler Schreiter

## 2013-08-07 NOTE — Anesthesia Procedure Notes (Addendum)
Performed by: Dollene Cleveland M    Procedure Name: Intubation Date/Time: 08/07/2013 8:29 AM Performed by: Maeola Harman Pre-anesthesia Checklist: Patient identified, Emergency Drugs available, Suction available, Patient being monitored and Timeout performed Patient Re-evaluated:Patient Re-evaluated prior to inductionOxygen Delivery Method: Circle system utilized Preoxygenation: Pre-oxygenation with 100% oxygen Intubation Type: IV induction Ventilation: Mask ventilation without difficulty Laryngoscope Size: Mac and 3 Grade View: Grade I Tube type: Oral Tube size: 7.5 mm Number of attempts: 1 Airway Equipment and Method: Stylet Placement Confirmation: ETT inserted through vocal cords under direct vision,  positive ETCO2 and breath sounds checked- equal and bilateral Secured at: 21 cm Tube secured with: Tape Dental Injury: Teeth and Oropharynx as per pre-operative assessment  Comments: Easy atraumatic induction and intubation with MAC 3 blade.  Dr. Chriss Driver verified placement of ETT.  Waldron Session, CRNA    Performed by: Kathlen Mody, Charlean Merl

## 2013-08-07 NOTE — Preoperative (Signed)
Beta Blockers   Reason not to administer Beta Blockers:Not Applicable 

## 2013-08-08 ENCOUNTER — Telehealth: Payer: Self-pay | Admitting: *Deleted

## 2013-08-08 ENCOUNTER — Encounter (HOSPITAL_COMMUNITY): Payer: Self-pay | Admitting: General Surgery

## 2013-08-08 LAB — CBC
HCT: 33.3 % — ABNORMAL LOW (ref 36.0–46.0)
Hemoglobin: 11.4 g/dL — ABNORMAL LOW (ref 12.0–15.0)
MCH: 32.5 pg (ref 26.0–34.0)
MCHC: 34.2 g/dL (ref 30.0–36.0)
MCV: 94.9 fL (ref 78.0–100.0)
Platelets: 138 10*3/uL — ABNORMAL LOW (ref 150–400)
RBC: 3.51 MIL/uL — ABNORMAL LOW (ref 3.87–5.11)
RDW: 13.4 % (ref 11.5–15.5)
WBC: 11.5 10*3/uL — ABNORMAL HIGH (ref 4.0–10.5)

## 2013-08-08 LAB — PROTIME-INR
INR: 1.28 (ref 0.00–1.49)
PROTHROMBIN TIME: 15.7 s — AB (ref 11.6–15.2)

## 2013-08-08 LAB — GLUCOSE, CAPILLARY
GLUCOSE-CAPILLARY: 107 mg/dL — AB (ref 70–99)
GLUCOSE-CAPILLARY: 139 mg/dL — AB (ref 70–99)
Glucose-Capillary: 122 mg/dL — ABNORMAL HIGH (ref 70–99)
Glucose-Capillary: 120 mg/dL — ABNORMAL HIGH (ref 70–99)
Glucose-Capillary: 132 mg/dL — ABNORMAL HIGH (ref 70–99)
Glucose-Capillary: 134 mg/dL — ABNORMAL HIGH (ref 70–99)

## 2013-08-08 LAB — COMPREHENSIVE METABOLIC PANEL
ALT: 58 U/L — ABNORMAL HIGH (ref 0–35)
AST: 60 U/L — AB (ref 0–37)
Albumin: 2.8 g/dL — ABNORMAL LOW (ref 3.5–5.2)
Alkaline Phosphatase: 59 U/L (ref 39–117)
BUN: 10 mg/dL (ref 6–23)
CALCIUM: 7.7 mg/dL — AB (ref 8.4–10.5)
CO2: 21 mEq/L (ref 19–32)
Chloride: 102 mEq/L (ref 96–112)
Creatinine, Ser: 0.68 mg/dL (ref 0.50–1.10)
GFR calc non Af Amer: 85 mL/min — ABNORMAL LOW (ref >90)
GLUCOSE: 113 mg/dL — AB (ref 70–99)
Potassium: 3.9 mEq/L (ref 3.7–5.3)
SODIUM: 137 meq/L (ref 137–147)
Total Bilirubin: 0.3 mg/dL (ref 0.3–1.2)
Total Protein: 5.3 g/dL — ABNORMAL LOW (ref 6.0–8.3)

## 2013-08-08 LAB — PHOSPHORUS: Phosphorus: 3.1 mg/dL (ref 2.3–4.6)

## 2013-08-08 LAB — MAGNESIUM: Magnesium: 1.8 mg/dL (ref 1.5–2.5)

## 2013-08-08 NOTE — Progress Notes (Signed)
1 Day Post-Op  Subjective: Complains of pain.  RN states that she was just a trace confused when she awoke this AM.    Objective: Vital signs in last 24 hours: Temp:  [97 F (36.1 C)-99.1 F (37.3 C)] 98.3 F (36.8 C) (03/12 0339) Pulse Rate:  [68-94] 81 (03/12 0700) Resp:  [14-22] 18 (03/12 0700) BP: (60-139)/(37-76) 119/48 mmHg (03/12 0700) SpO2:  [91 %-97 %] 94 % (03/12 0700) Arterial Line BP: (108-182)/(46-75) 116/46 mmHg (03/12 0700) Weight:  [165 lb 2 oz (74.9 kg)] 165 lb 2 oz (74.9 kg) (03/12 0600)    Intake/Output from previous day: 03/11 0701 - 03/12 0700 In: 4072 [I.V.:3432; NG/GT:90; IV Piggyback:550] Out: 2039 [Urine:1585; Emesis/NG output:100; Drains:229; Blood:125] Intake/Output this shift:    General appearance: alert, cooperative, mild distress and out of bed in chair Resp: breathing comfortably GI: soft, sl distended, drains serosang Extremities: extremities normal, atraumatic, no cyanosis or edema Bilious NGT output.    Lab Results:   Recent Labs  08/07/13 1630 08/08/13 0340  WBC 12.7* 11.5*  HGB 12.7 11.4*  HCT 36.4 33.3*  PLT 126* 138*   BMET  Recent Labs  08/07/13 1630 08/08/13 0340  NA  --  137  K  --  3.9  CL  --  102  CO2  --  21  GLUCOSE  --  113*  BUN  --  10  CREATININE 0.64 0.68  CALCIUM  --  7.7*   PT/INR  Recent Labs  08/08/13 0340  LABPROT 15.7*  INR 1.28   ABG No results found for this basename: PHART, PCO2, PO2, HCO3,  in the last 72 hours  Studies/Results: No results found.  Anti-infectives: Anti-infectives   Start     Dose/Rate Route Frequency Ordered Stop   08/08/13 0800  ertapenem (INVANZ) 1 g in sodium chloride 0.9 % 50 mL IVPB     1 g 100 mL/hr over 30 Minutes Intravenous Every 24 hours 08/07/13 1559 08/09/13 0759   08/07/13 0600  ertapenem (INVANZ) 1 g in sodium chloride 0.9 % 50 mL IVPB     1 g 100 mL/hr over 30 Minutes Intravenous On call to O.R. 08/06/13 1431 08/07/13 0829       Assessment/Plan: s/p Procedure(s): LAPAROSCOPY DIAGNOSTIC (N/A) WHIPPLE PROCEDURE (N/A) PAS Continue foley due to strict I&O, patient in ICU and urinary output monitoring HTN - on metoprolol Pulmonary toilet Work on pain control Add back PO meds hopefully tomorrow. Keep in ICU today for urinary output monitoring and pulmonary toilet.   Pt sl frail.   PT consult.     LOS: 1 day    [facility] 08/08/2013

## 2013-08-08 NOTE — Anesthesia Postprocedure Evaluation (Signed)
  Anesthesia Post-op Note  Patient: [name]  Procedure(s) Performed: Procedure(s): LAPAROSCOPY DIAGNOSTIC (N/A) WHIPPLE PROCEDURE (N/A)  Patient Location: PACU  Anesthesia Type:General  Level of Consciousness: awake, alert  and oriented  Airway and Oxygen Therapy: Patient Spontanous Breathing and Patient connected to nasal cannula oxygen  Post-op Pain: mild  Post-op Assessment: Post-op Vital signs reviewed, Patient's Cardiovascular Status Stable, Respiratory Function Stable, Patent Airway, No signs of Nausea or vomiting and Pain level controlled  Post-op Vital Signs: stable  Complications: No apparent anesthesia complications

## 2013-08-08 NOTE — Progress Notes (Signed)
Dr [name] updated no measurable output via  NGT this shift. Updated when ngt flushed q4, volume not suctioning out once hooked back to suction. (+) auscultation. MD stated to pull NGT back ~3inches, no post re position xray as long as able to auscultate. Will continue to monitor. [name]

## 2013-08-08 NOTE — Progress Notes (Signed)
Morphine PCA syringe changed. 65mL waste with syringe change, Milagros Loll RN witness waste in sink. Reather Laurence

## 2013-08-09 ENCOUNTER — Telehealth (INDEPENDENT_AMBULATORY_CARE_PROVIDER_SITE_OTHER): Payer: Self-pay | Admitting: General Surgery

## 2013-08-09 LAB — TYPE AND SCREEN
ABO/RH(D): A POS
Antibody Screen: NEGATIVE
UNIT DIVISION: 0
Unit division: 0
Unit division: 0
Unit division: 0

## 2013-08-09 LAB — CBC
HEMATOCRIT: 32.5 % — AB (ref 36.0–46.0)
Hemoglobin: 11 g/dL — ABNORMAL LOW (ref 12.0–15.0)
MCH: 32.4 pg (ref 26.0–34.0)
MCHC: 33.8 g/dL (ref 30.0–36.0)
MCV: 95.9 fL (ref 78.0–100.0)
PLATELETS: 130 10*3/uL — AB (ref 150–400)
RBC: 3.39 MIL/uL — ABNORMAL LOW (ref 3.87–5.11)
RDW: 13.7 % (ref 11.5–15.5)
WBC: 11.3 10*3/uL — AB (ref 4.0–10.5)

## 2013-08-09 LAB — COMPREHENSIVE METABOLIC PANEL
ALK PHOS: 73 U/L (ref 39–117)
ALT: 51 U/L — AB (ref 0–35)
AST: 47 U/L — AB (ref 0–37)
Albumin: 2.8 g/dL — ABNORMAL LOW (ref 3.5–5.2)
BILIRUBIN TOTAL: 0.4 mg/dL (ref 0.3–1.2)
BUN: 5 mg/dL — ABNORMAL LOW (ref 6–23)
CHLORIDE: 104 meq/L (ref 96–112)
CO2: 21 mEq/L (ref 19–32)
Calcium: 8.1 mg/dL — ABNORMAL LOW (ref 8.4–10.5)
Creatinine, Ser: 0.59 mg/dL (ref 0.50–1.10)
GFR calc Af Amer: >90 mL/min (ref >90)
GFR calc non Af Amer: 89 mL/min — ABNORMAL LOW (ref >90)
Glucose, Bld: 152 mg/dL — ABNORMAL HIGH (ref 70–99)
POTASSIUM: 4.1 meq/L (ref 3.7–5.3)
Sodium: 139 mEq/L (ref 137–147)
Total Protein: 5.5 g/dL — ABNORMAL LOW (ref 6.0–8.3)

## 2013-08-09 LAB — GLUCOSE, CAPILLARY
GLUCOSE-CAPILLARY: 130 mg/dL — AB (ref 70–99)
Glucose-Capillary: 127 mg/dL — ABNORMAL HIGH (ref 70–99)
Glucose-Capillary: 113 mg/dL — ABNORMAL HIGH (ref 70–99)
Glucose-Capillary: 124 mg/dL — ABNORMAL HIGH (ref 70–99)

## 2013-08-09 MED ORDER — TOPIRAMATE 100 MG PO TABS
200.0000 mg | ORAL_TABLET | Freq: Two times a day (BID) | ORAL | Status: DC
  Administered 2013-08-09 – 2013-09-12 (×60): 200 mg via ORAL
  Filled 2013-08-09 (×73): qty 2

## 2013-08-09 MED ORDER — GABAPENTIN 100 MG PO CAPS
200.0000 mg | ORAL_CAPSULE | Freq: Three times a day (TID) | ORAL | Status: DC
  Administered 2013-08-09 – 2013-08-16 (×16): 200 mg via ORAL
  Filled 2013-08-09 (×31): qty 2

## 2013-08-09 MED ORDER — VITAMIN E 180 MG (400 UNIT) PO CAPS
800.0000 [IU] | ORAL_CAPSULE | Freq: Two times a day (BID) | ORAL | Status: DC
  Administered 2013-08-09 – 2013-09-12 (×51): 800 [IU] via ORAL
  Filled 2013-08-09 (×72): qty 2

## 2013-08-09 MED ORDER — GABAPENTIN 300 MG PO CAPS
300.0000 mg | ORAL_CAPSULE | Freq: Every day | ORAL | Status: DC
  Administered 2013-08-09 – 2013-08-16 (×6): 300 mg via ORAL
  Filled 2013-08-09 (×10): qty 1

## 2013-08-09 MED ORDER — FLUOXETINE HCL 20 MG PO CAPS
20.0000 mg | ORAL_CAPSULE | Freq: Every day | ORAL | Status: DC
  Administered 2013-08-09 – 2013-08-18 (×10): 20 mg via ORAL
  Filled 2013-08-09 (×11): qty 1

## 2013-08-09 MED ORDER — MORPHINE SULFATE 2 MG/ML IJ SOLN
1.0000 mg | INTRAMUSCULAR | Status: DC | PRN
  Administered 2013-08-09 – 2013-08-10 (×3): 2 mg via INTRAVENOUS
  Administered 2013-08-10: 4 mg via INTRAVENOUS
  Administered 2013-08-11 – 2013-08-16 (×16): 2 mg via INTRAVENOUS
  Administered 2013-08-16: 4 mg via INTRAVENOUS
  Administered 2013-08-16 (×3): 2 mg via INTRAVENOUS
  Administered 2013-08-16 (×2): 4 mg via INTRAVENOUS
  Administered 2013-08-17 (×2): 2 mg via INTRAVENOUS
  Administered 2013-08-18: 4 mg via INTRAVENOUS
  Administered 2013-08-18: 2 mg via INTRAVENOUS
  Administered 2013-08-19: 4 mg via INTRAVENOUS
  Administered 2013-08-19: 2 mg via INTRAVENOUS
  Administered 2013-08-19 – 2013-08-20 (×2): 4 mg via INTRAVENOUS
  Administered 2013-08-20 (×2): 2 mg via INTRAVENOUS
  Administered 2013-08-20: 4 mg via INTRAVENOUS
  Administered 2013-08-20 – 2013-08-22 (×3): 2 mg via INTRAVENOUS
  Administered 2013-08-24 (×2): 4 mg via INTRAVENOUS
  Administered 2013-08-24 – 2013-09-02 (×11): 2 mg via INTRAVENOUS
  Filled 2013-08-09 (×2): qty 1
  Filled 2013-08-09: qty 2
  Filled 2013-08-09: qty 1
  Filled 2013-08-09: qty 2
  Filled 2013-08-09: qty 1
  Filled 2013-08-09: qty 2
  Filled 2013-08-09 (×8): qty 1
  Filled 2013-08-09: qty 2
  Filled 2013-08-09 (×4): qty 1
  Filled 2013-08-09 (×2): qty 2
  Filled 2013-08-09 (×7): qty 1
  Filled 2013-08-09: qty 2
  Filled 2013-08-09 (×2): qty 1
  Filled 2013-08-09: qty 2
  Filled 2013-08-09 (×5): qty 1
  Filled 2013-08-09: qty 2
  Filled 2013-08-09 (×9): qty 1
  Filled 2013-08-09: qty 2
  Filled 2013-08-09: qty 1
  Filled 2013-08-09: qty 2
  Filled 2013-08-09: qty 1
  Filled 2013-08-09: qty 2
  Filled 2013-08-09 (×3): qty 1

## 2013-08-09 NOTE — Progress Notes (Signed)
SLP Cancellation Note  Patient Details Name: [name] MRN: [id number] DOB: [birth year]   Cancelled eval:     Spoke with RN, who reports pt has been having no difficulty with POs and will D/C swallow orders.  If pt demonstrates deficits, please re-order.  Thanks.      Juan Quam Laurice 08/09/2013, 9:14 AM

## 2013-08-09 NOTE — Progress Notes (Signed)
Physical Therapy Treatment Patient Details Name: [name] MRN: [id number] DOB: 03-11-1941 Today's Date: 08/09/2013 Time: 2248-2500 PT Time Calculation (min): 43 min  PT Assessment / Plan / Recommendation  History of Present Illness admitted with adenocarcinoma of the pancreatic head, s/p whipple procedure, stenting and diagnostic lap.   PT Comments   Pt admitted with/for above surgical procedures due to pancreatic cancer.  Pt currently limited functionally due to the problems listed. ( See problems list.)   Pt will benefit from PT to maximize function and safety in order to get ready for next venue listed below.   Follow Up Recommendations  SNF;Supervision for mobility/OOB     Does the patient have the potential to tolerate intense rehabilitation     Barriers to Discharge        Equipment Recommendations  None recommended by PT    Recommendations for Other Services    Frequency Min 3X/week   Progress towards PT Goals    Plan      Precautions / Restrictions Precautions Precautions: Fall   Pertinent Vitals/Pain vss  sats 96%, hr in 90's    Mobility  Bed Mobility Overal bed mobility: Needs Assistance Bed Mobility: Supine to Sit Supine to sit: Mod assist General bed mobility comments: tried rolling without success due to pain.  needed significant truncal assist Transfers Overall transfer level: Needs assistance Transfers: Sit to/from Stand;Stand Pivot Transfers Sit to Stand: Mod assist Stand pivot transfers: Mod assist General transfer comment: significant lifting assist Ambulation/Gait Ambulation/Gait assistance: Mod assist Ambulation Distance (Feet): 24 Feet Assistive device:  (w/c) Gait Pattern/deviations: Step-through pattern;Decreased step length - right;Decreased step length - left;Decreased stride length;Shuffle;Trunk flexed Gait velocity interpretation: Below normal speed for age/gender    Exercises     PT Diagnosis: Generalized weakness;Acute pain  PT  Problem List: Decreased strength;Decreased activity tolerance;Decreased balance;Decreased mobility;Decreased knowledge of use of DME PT Treatment Interventions: DME instruction;Gait training;Functional mobility training;Therapeutic activities;Balance training;Patient/family education   PT Goals (current goals can now be found in the care plan section) Acute Rehab PT Goals Patient Stated Goal: ultimately home PT Goal Formulation: With patient Time For Goal Achievement: 08/23/13 Potential to Achieve Goals: Good  Visit Information  Last PT Received On: 08/09/13 Assistance Needed: +2 (for lines/tubes) History of Present Illness: admitted with adenocarcinoma of the pancreatic head, s/p whipple procedure, stenting and diagnostic lap.    Subjective Data  Patient Stated Goal: ultimately home   Cognition  Cognition Arousal/Alertness: Awake/alert;Lethargic Behavior During Therapy: WFL for tasks assessed/performed Overall Cognitive Status: Impaired/Different from baseline    Balance  Balance Overall balance assessment: Needs assistance Sitting-balance support: Feet supported;Single extremity supported Sitting balance-Leahy Scale: Fair Standing balance support: Bilateral upper extremity supported Standing balance-Leahy Scale: Poor  End of Session PT - End of Session Equipment Utilized During Treatment: Oxygen Activity Tolerance: Patient tolerated treatment well Patient left: with call bell/phone within reach;in chair Nurse Communication: Mobility status   GP     [name], [name] 08/09/2013, 10:57 AM 08/09/2013  [name], PT [phone]  (pager)

## 2013-08-09 NOTE — Progress Notes (Signed)
Clinical Social Work Department CLINICAL SOCIAL WORK PLACEMENT NOTE 08/09/2013  Patient:  [name], [name]  Account Number:  [id number] Admit date:  08/07/2013  Clinical Social Worker:  Megan Salon  Date/time:  08/09/2013 03:38 PM  Clinical Social Work is seeking post-discharge placement for this patient at the following level of care:   [place]   (*CSW will update this form in Epic as items are completed)   08/09/2013  Patient/family provided with [place] Department of Clinical Social Work's list of facilities offering this level of care within the geographic area requested by the patient (or if unable, by the patient's family).  08/09/2013  Patient/family informed of their freedom to choose among providers that offer the needed level of care, that participate in Medicare, Medicaid or managed care program needed by the patient, have an available bed and are willing to accept the patient.  08/09/2013  Patient/family informed of [facility]' ownership interest in [facility], as well as of the fact that they are under no obligation to receive care at this facility.  PASARR submitted to EDS on 08/09/2013 PASARR number received from EDS on 08/09/2013  FL2 transmitted to all facilities in geographic area requested by pt/family on  08/09/2013 FL2 transmitted to all facilities within larger geographic area on   Patient informed that his/her managed care company has contracts with or will negotiate with  certain facilities, including the following:     Patient/family informed of bed offers received:   Patient chooses bed at  Physician recommends and patient chooses bed at    Patient to be transferred to  on   Patient to be transferred to facility by   The following physician request were entered in Epic:   Additional Comments:  Jeanette Caprice, MSW, [place]

## 2013-08-09 NOTE — Care Management Note (Signed)
    Page 1 of 1   08/09/2013     3:12:16 PM   CARE MANAGEMENT NOTE 08/09/2013  Patient:  [name], [name]   Account Number:  [id number]  Date Initiated:  08/07/2013  Documentation initiated by:  Luz Lex  Subjective/Objective Assessment:   Post op - Diagnostic laparoscopy   Classic pancreaticoduodenectomy   Placement of biliary and pancreatic stent     Action/Plan:   Anticipated DC Date:  08/13/2013   Anticipated DC Plan:  [place]  CM consult      Choice offered to / List presented to:             Status of service:  In process, will continue to follow Medicare Important Message given?   (If response is "NO", the following Medicare IM given date fields will be blank) Date Medicare IM given:   Date Additional Medicare IM given:    Discharge Disposition:    Per UR Regulation:  Reviewed for med. necessity/level of care/duration of stay  If discussed at [place] of Stay Meetings, dates discussed:    Comments:  Contact:  Smyth,Roger Alfonzo Beers [phone]                 Offer,Chris Son [phone]                 Horine,Kim Relative     [phone]  08-09-13 3pm Luz Lex, [place] - [phone] Son called nurse and states they are interested in a rehab facility for their mother.  Would like [facility] if possible - PT recommending SNF.  SW consult placed.  08-08-13 1pm Luz Lex, [place] Son - [name] in room, states he is the son that is local. other son from [place].  States his mom could go to his house on discharge if needed but lives at home independently prior.

## 2013-08-09 NOTE — Progress Notes (Signed)
Patient had been repeatedly warned throughout the night to leave the NG tube alone. Entered the room to find the NG tube in the patient's hand.  A new NG tube was obtained, placed and verified by auscultation via two nurses.  NG hooked up to low intermittent suction. Output consisting of dark green bile. Mitten was placed on patient's right hand, left hand still available to use PCA. Will continue to monitor patient closely.

## 2013-08-09 NOTE — Telephone Encounter (Signed)
Discussed pt disorientation with son.  I reviewed the strategies that we try to take to minimize confusion.  Will transfer to 3300 today.  We will also add back some of her neuro/psych meds.

## 2013-08-09 NOTE — Progress Notes (Signed)
Clinical Social Work Department BRIEF PSYCHOSOCIAL ASSESSMENT 08/09/2013  Patient:  YANELLY, CANTRELLE     Account Number:  [id number]     Admit date:  08/07/2013  Clinical Social Worker:  Megan Salon  Date/Time:  08/09/2013 03:34 PM  Referred by:  Care Management  Date Referred:  08/09/2013 Referred for  SNF Placement   Other Referral:   Interview type:  Family Other interview type:   CSW spoke to patient's son over the phone    PSYCHOSOCIAL DATA Living Status:  ALONE Admitted from facility:   Level of care:   Primary support name:  Roger Primary support relationship to patient:  CHILD, ADULT Degree of support available:   Good    CURRENT CONCERNS Current Concerns  Post-Acute Placement   Other Concerns:    SOCIAL WORK ASSESSMENT / PLAN Clinical Social Worker received referral from [facility] for SNF placement at d/c. Per CM, patient is disoriented and to call patient's son, Louie Casa. CSW called patient's son and explained SNF process.  Patient's son  reported he is agreeable for SNF placement and prefers [facility]. CSW encouraged Louie Casa to think about additional SNF options pending availability of preferred facility. Louie Casa states he wishes Education officer, museum could tell him the best place to go. Social worker explained that we are not able to share our opinions. Patient's son states he understands and hope he is making the best decision.   Assessment/plan status:  Psychosocial Support/Ongoing Assessment of Needs Other assessment/ plan:   Information/referral to community resources:   SNF information    PATIENT'S/FAMILY'S RESPONSE TO PLAN OF CARE: Patient's son states that he will be out of town until Wednesday and to call his wife, Marquitta Persichetti @ [phone] for bed offers. CSW will complete FL2 for MD's signature and will update  family when bed offers are received.       Jeanette Caprice, MSW, [place]

## 2013-08-09 NOTE — Progress Notes (Signed)
2 Days Post-Op  Subjective: Complains of pain.  Patient still a bit confused.  Pulled out NGT.  Did not rest last night.     Objective: Vital signs in last 24 hours: Temp:  [97.6 F (36.4 C)-99.6 F (37.6 C)] 97.6 F (36.4 C) (03/13 0711) Pulse Rate:  [68-109] 99 (03/13 0700) Resp:  [13-28] 20 (03/13 0700) BP: (111-154)/(31-73) 147/68 mmHg (03/13 0700) SpO2:  [88 %-100 %] 95 % (03/13 0700) Arterial Line BP: (116-179)/(44-65) 142/60 mmHg (03/13 0700)    Intake/Output from previous day: 03/12 0701 - 03/13 0700 In: 2471.5 [I.V.:2281.5; NG/GT:140; IV Piggyback:50] Out: 5093 [Urine:2650; Emesis/NG output:175; Drains:295] Intake/Output this shift:    General appearance: eyes open, cooperative, mild distress, but sl disoriented.   Resp: breathing comfortably GI: soft, sl distended, drains serosang Extremities: extremities normal, atraumatic, no cyanosis or edema Bilious NGT output.    Lab Results:   Recent Labs  08/08/13 0340 08/09/13 0400  WBC 11.5* 11.3*  HGB 11.4* 11.0*  HCT 33.3* 32.5*  PLT 138* 130*   BMET  Recent Labs  08/08/13 0340 08/09/13 0400  NA 137 139  K 3.9 4.1  CL 102 104  CO2 21 21  GLUCOSE 113* 152*  BUN 10 5*  CREATININE 0.68 0.59  CALCIUM 7.7* 8.1*   PT/INR  Recent Labs  08/08/13 0340  LABPROT 15.7*  INR 1.28   ABG No results found for this basename: PHART, PCO2, PO2, HCO3,  in the last 72 hours  Studies/Results: No results found.  Anti-infectives: Anti-infectives   Start     Dose/Rate Route Frequency Ordered Stop   08/08/13 0800  ertapenem (INVANZ) 1 g in sodium chloride 0.9 % 50 mL IVPB     1 g 100 mL/hr over 30 Minutes Intravenous Every 24 hours 08/07/13 1559 08/08/13 0913   08/07/13 0600  ertapenem (INVANZ) 1 g in sodium chloride 0.9 % 50 mL IVPB     1 g 100 mL/hr over 30 Minutes Intravenous On call to O.R. 08/06/13 1431 08/07/13 0829      Assessment/Plan: s/p Procedure(s): LAPAROSCOPY DIAGNOSTIC (N/A) WHIPPLE  PROCEDURE (N/A) PAS Continue foley due to diuresis HTN - on metoprolol Pulmonary toilet Work on pain control Bedside swallow Hyperglycemia - SSI.   Add back psych/neuro meds  Keep in ICU today for urinary output monitoring and pulmonary toilet.    PT/OT consult.     LOS: 2 days    [facility] 08/09/2013

## 2013-08-10 LAB — CBC
HEMATOCRIT: 32.5 % — AB (ref 36.0–46.0)
HEMOGLOBIN: 10.9 g/dL — AB (ref 12.0–15.0)
MCH: 32.3 pg (ref 26.0–34.0)
MCHC: 33.5 g/dL (ref 30.0–36.0)
MCV: 96.4 fL (ref 78.0–100.0)
Platelets: 135 10*3/uL — ABNORMAL LOW (ref 150–400)
RBC: 3.37 MIL/uL — ABNORMAL LOW (ref 3.87–5.11)
RDW: 13.8 % (ref 11.5–15.5)
WBC: 10.5 10*3/uL (ref 4.0–10.5)

## 2013-08-10 LAB — COMPREHENSIVE METABOLIC PANEL
ALT: 36 U/L — ABNORMAL HIGH (ref 0–35)
AST: 27 U/L (ref 0–37)
Albumin: 2.6 g/dL — ABNORMAL LOW (ref 3.5–5.2)
Alkaline Phosphatase: 82 U/L (ref 39–117)
BUN: 5 mg/dL — ABNORMAL LOW (ref 6–23)
CALCIUM: 8.3 mg/dL — AB (ref 8.4–10.5)
CO2: 22 meq/L (ref 19–32)
Chloride: 106 mEq/L (ref 96–112)
Creatinine, Ser: 0.64 mg/dL (ref 0.50–1.10)
GFR calc Af Amer: >90 mL/min (ref >90)
GFR, EST NON AFRICAN AMERICAN: 86 mL/min — AB (ref >90)
GLUCOSE: 134 mg/dL — AB (ref 70–99)
Potassium: 3.9 mEq/L (ref 3.7–5.3)
Sodium: 141 mEq/L (ref 137–147)
TOTAL PROTEIN: 5.6 g/dL — AB (ref 6.0–8.3)
Total Bilirubin: 0.5 mg/dL (ref 0.3–1.2)

## 2013-08-10 LAB — GLUCOSE, CAPILLARY
GLUCOSE-CAPILLARY: 116 mg/dL — AB (ref 70–99)
GLUCOSE-CAPILLARY: 126 mg/dL — AB (ref 70–99)
Glucose-Capillary: 128 mg/dL — ABNORMAL HIGH (ref 70–99)
Glucose-Capillary: 134 mg/dL — ABNORMAL HIGH (ref 70–99)
Glucose-Capillary: 108 mg/dL — ABNORMAL HIGH (ref 70–99)
Glucose-Capillary: 105 mg/dL — ABNORMAL HIGH (ref 70–99)

## 2013-08-10 MED ORDER — LORAZEPAM 0.5 MG PO TABS
0.5000 mg | ORAL_TABLET | Freq: Three times a day (TID) | ORAL | Status: DC | PRN
  Administered 2013-08-10 – 2013-08-15 (×4): 0.5 mg via ORAL
  Filled 2013-08-10 (×6): qty 1

## 2013-08-10 NOTE — Progress Notes (Signed)
Pt transferred to 3S16. Report giiven to [employer]. Vital signs per frequent flowsheet.

## 2013-08-10 NOTE — Progress Notes (Signed)
3 Days Post-Op  Subjective: Pt states she feels better this AM.  Had some nausea with some meds and H2O. OOBTC to this AM  Objective: Vital signs in last 24 hours: Temp:  [98.1 F (36.7 C)-99.7 F (37.6 C)] 98.4 F (36.9 C) (03/14 0716) Pulse Rate:  [83-111] 83 (03/14 0700) Resp:  [13-31] 19 (03/14 0700) BP: (92-134)/(50-106) 130/57 mmHg (03/14 0700) SpO2:  [95 %-99 %] 98 % (03/14 0700) Weight:  [162 lb 4.1 oz (73.6 kg)] 162 lb 4.1 oz (73.6 kg) (03/14 0600)    Intake/Output from previous day: 03/13 0701 - 03/14 0700 In: 1870 [I.V.:1870] Out: 1566 [Urine:1370; Drains:196] Intake/Output this shift:    General appearance: alert and cooperative Cardio: regular rate and rhythm, S1, S2 normal, no murmur, click, rub or gallop GI: soft, approp ttp, ND wound dressing intact, JP SS  Lab Results:   Recent Labs  08/09/13 0400 08/10/13 0320  WBC 11.3* 10.5  HGB 11.0* 10.9*  HCT 32.5* 32.5*  PLT 130* 135*   BMET  Recent Labs  08/09/13 0400 08/10/13 0320  NA 139 141  K 4.1 3.9  CL 104 106  CO2 21 22  GLUCOSE 152* 134*  BUN 5* 5*  CREATININE 0.59 0.64  CALCIUM 8.1* 8.3*   PT/INR  Recent Labs  08/08/13 0340  LABPROT 15.7*  INR 1.28   ABG No results found for this basename: PHART, PCO2, PO2, HCO3,  in the last 72 hours  Studies/Results: No results found.  Anti-infectives: Anti-infectives   Start     Dose/Rate Route Frequency Ordered Stop   08/08/13 0800  ertapenem (INVANZ) 1 g in sodium chloride 0.9 % 50 mL IVPB     1 g 100 mL/hr over 30 Minutes Intravenous Every 24 hours 08/07/13 1559 08/08/13 0913   08/07/13 0600  ertapenem (INVANZ) 1 g in sodium chloride 0.9 % 50 mL IVPB     1 g 100 mL/hr over 30 Minutes Intravenous On call to O.R. 08/06/13 1431 08/07/13 0829      Assessment/Plan: s/p Procedure(s): LAPAROSCOPY DIAGNOSTIC (N/A) WHIPPLE PROCEDURE (N/A)  PAS Continue foley due to diuresis HTN - on metoprolol Pulmonary toilet Work on pain  control Some nausea with min clears will hold off on PO today Hyperglycemia - SSI.   Add back psych/neuro meds   LOS: 3 days    Rosario Jacks., [facility] 08/10/2013

## 2013-08-10 NOTE — Clinical Social Work Note (Signed)
Clinical Social Worker (CSW) contacted daughter in law Jasiah Buntin) [phone] and discussed bed offers. Kim informed CSW that she would have to speak with patient's son in order to discuss offers and research the facilities. Kim thanked CSW for assistance in d/c plan. CSW verbalized understanding of the above. CSW to continue to be available as needs arise in d/c plan.  Nadine, [place] Weekend Clinical Social Worker [phone]

## 2013-08-11 LAB — COMPREHENSIVE METABOLIC PANEL
ALT: 42 U/L — AB (ref 0–35)
AST: 39 U/L — ABNORMAL HIGH (ref 0–37)
Albumin: 2.4 g/dL — ABNORMAL LOW (ref 3.5–5.2)
Alkaline Phosphatase: 133 U/L — ABNORMAL HIGH (ref 39–117)
BUN: 8 mg/dL (ref 6–23)
CHLORIDE: 108 meq/L (ref 96–112)
CO2: 21 mEq/L (ref 19–32)
CREATININE: 0.66 mg/dL (ref 0.50–1.10)
Calcium: 8.2 mg/dL — ABNORMAL LOW (ref 8.4–10.5)
GFR calc Af Amer: >90 mL/min (ref >90)
GFR, EST NON AFRICAN AMERICAN: 86 mL/min — AB (ref >90)
Glucose, Bld: 117 mg/dL — ABNORMAL HIGH (ref 70–99)
Potassium: 3.8 mEq/L (ref 3.7–5.3)
Sodium: 141 mEq/L (ref 137–147)
Total Bilirubin: 0.4 mg/dL (ref 0.3–1.2)
Total Protein: 5.7 g/dL — ABNORMAL LOW (ref 6.0–8.3)

## 2013-08-11 LAB — GLUCOSE, CAPILLARY
GLUCOSE-CAPILLARY: 114 mg/dL — AB (ref 70–99)
GLUCOSE-CAPILLARY: 128 mg/dL — AB (ref 70–99)
GLUCOSE-CAPILLARY: 139 mg/dL — AB (ref 70–99)
Glucose-Capillary: 111 mg/dL — ABNORMAL HIGH (ref 70–99)
Glucose-Capillary: 117 mg/dL — ABNORMAL HIGH (ref 70–99)
Glucose-Capillary: 126 mg/dL — ABNORMAL HIGH (ref 70–99)
Glucose-Capillary: 131 mg/dL — ABNORMAL HIGH (ref 70–99)

## 2013-08-11 LAB — CBC
HCT: 32.7 % — ABNORMAL LOW (ref 36.0–46.0)
Hemoglobin: 10.7 g/dL — ABNORMAL LOW (ref 12.0–15.0)
MCH: 31.9 pg (ref 26.0–34.0)
MCHC: 32.7 g/dL (ref 30.0–36.0)
MCV: 97.6 fL (ref 78.0–100.0)
Platelets: 155 10*3/uL (ref 150–400)
RBC: 3.35 MIL/uL — AB (ref 3.87–5.11)
RDW: 13.9 % (ref 11.5–15.5)
WBC: 7.7 10*3/uL (ref 4.0–10.5)

## 2013-08-11 LAB — MRSA PCR SCREENING: MRSA by PCR: POSITIVE — AB

## 2013-08-11 MED ORDER — CHLORHEXIDINE GLUCONATE CLOTH 2 % EX PADS
6.0000 | MEDICATED_PAD | Freq: Every day | CUTANEOUS | Status: AC
  Administered 2013-08-11 – 2013-08-15 (×5): 6 via TOPICAL

## 2013-08-11 MED ORDER — MUPIROCIN 2 % EX OINT
1.0000 "application " | TOPICAL_OINTMENT | Freq: Two times a day (BID) | CUTANEOUS | Status: AC
  Administered 2013-08-11 – 2013-08-15 (×10): 1 via NASAL
  Filled 2013-08-11: qty 22

## 2013-08-11 NOTE — Progress Notes (Signed)
Physician notified: Coralie Keens, PA At: [phone]  Regarding: Pt now having bloody, frank red emesis, small amount; was bile, green. No response.   Physician notified: on call surgeon via call center.  At: [phone]  Regarding: Pt now having frank red emesis, small amounts in spit form. She was having bile, green emesis in small amounts spit form.  Awaiting return response.   Dr. Georgette Dover Returned Response at: [phone]  Order(s): No new orders. Will check CBC and CMP in AM.

## 2013-08-11 NOTE — Progress Notes (Signed)
4 Days Post-Op  Subjective: Pt doing well, mild nausea.  Tolerating some sips of clears.  Would like to try some broth/coffee.  Doesn't know if she's had any flatus.  No BM yet.    Objective: Vital signs in last 24 hours: Temp:  [97.9 F (36.6 C)-98.7 F (37.1 C)] 98.7 F (37.1 C) (03/15 0700) Pulse Rate:  [91-102] 98 (03/15 0720) Resp:  [16-32] 19 (03/15 0720) BP: (84-140)/(34-114) 114/34 mmHg (03/15 0720) SpO2:  [95 %-100 %] 98 % (03/15 0720) Weight:  [162 lb 11.2 oz (73.8 kg)] 162 lb 11.2 oz (73.8 kg) (03/14 2233)    Intake/Output from previous day: 03/14 0701 - 03/15 0700 In: 1570 [I.V.:1500] Out: 1934 [Urine:1650; Drains:284] Intake/Output this shift:    PE: Gen:  Alert, NAD, pleasant Card:  RRR, no M/G/R heard Pulm:  CTA, no W/R/R Abd: Soft, mild distension, mild tenderness over incisions sites, +BS, no HSM, incisions C/D/I with staples in place, drains 1&2 with serosanguinous drainage (total 263mL/24 hours), drain sites and incisions without signs of infection infection   Lab Results:   Recent Labs  08/10/13 0320 08/11/13 0253  WBC 10.5 7.7  HGB 10.9* 10.7*  HCT 32.5* 32.7*  PLT 135* 155   BMET  Recent Labs  08/10/13 0320 08/11/13 0253  NA 141 141  K 3.9 3.8  CL 106 108  CO2 22 21  GLUCOSE 134* 117*  BUN 5* 8  CREATININE 0.64 0.66  CALCIUM 8.3* 8.2*   PT/INR No results found for this basename: LABPROT, INR,  in the last 72 hours CMP     Component Value Date/Time   NA 141 08/11/2013 0253   K 3.8 08/11/2013 0253   CL 108 08/11/2013 0253   CO2 21 08/11/2013 0253   GLUCOSE 117* 08/11/2013 0253   BUN 8 08/11/2013 0253   CREATININE 0.66 08/11/2013 0253   CREATININE 0.81 07/01/2013 1324   CALCIUM 8.2* 08/11/2013 0253   PROT 5.7* 08/11/2013 0253   ALBUMIN 2.4* 08/11/2013 0253   AST 39* 08/11/2013 0253   ALT 42* 08/11/2013 0253   ALKPHOS 133* 08/11/2013 0253   BILITOT 0.4 08/11/2013 0253   GFRNONAA 86* 08/11/2013 0253   GFRAA >90 08/11/2013 0253   Lipase   No results found for this basename: lipase       Studies/Results: No results found.  Anti-infectives: Anti-infectives   Start     Dose/Rate Route Frequency Ordered Stop   08/08/13 0800  ertapenem (INVANZ) 1 g in sodium chloride 0.9 % 50 mL IVPB     1 g 100 mL/hr over 30 Minutes Intravenous Every 24 hours 08/07/13 1559 08/08/13 0913   08/07/13 0600  ertapenem (INVANZ) 1 g in sodium chloride 0.9 % 50 mL IVPB     1 g 100 mL/hr over 30 Minutes Intravenous On call to O.R. 08/06/13 1431 08/07/13 0829       Assessment/Plan POD 4 s/p diagnostic laparoscopy and whipple procedure  Plan: 1.  SCD's, mobilization 2.  D/c foley and try voiding trial 3.  HTN - on metoprolol  4.  Pulmonary toilet  5.  Work on pain control  6.  Some nausea but better than yesterday.  Start clears. 7.  Hyperglycemia - SSI.  8.  On home meds  Disp:  awaiting advancement of diet, SNF upon d/c    LOS: 4 days    DORT, Tanya Marvin 08/11/2013, 8:42 AM Pager: [phone]

## 2013-08-11 NOTE — Progress Notes (Signed)
Mild confusion, but easily reoriented Will try some clears today. No BM yet  Imogene Burn. Georgette Dover, MD, [facility] Surgery  General/ Trauma Surgery  08/11/2013 10:19 AM

## 2013-08-11 NOTE — Progress Notes (Signed)
Physician notified: Coralie Keens, PA At: [phone]  Regarding: Pt nauseated and vomiting with minimal relief from IVP zofran. Any other antiemetics to try? Continue clear liquid diet?  Awaiting return response.   Order(s): No new meds. Make NPO

## 2013-08-11 NOTE — Progress Notes (Addendum)
Foley removal delayed d/t patient very nauseated. Will continue to monitor and DC as nausea subsides.  Pt not tolerating clear liquids well. Having nausea and vomiting x 4 hours with minimal relief from zofran.

## 2013-08-12 LAB — COMPREHENSIVE METABOLIC PANEL
ALT: 39 U/L — ABNORMAL HIGH (ref 0–35)
AST: 30 U/L (ref 0–37)
Albumin: 2.3 g/dL — ABNORMAL LOW (ref 3.5–5.2)
Alkaline Phosphatase: 154 U/L — ABNORMAL HIGH (ref 39–117)
BUN: 8 mg/dL (ref 6–23)
CALCIUM: 8.1 mg/dL — AB (ref 8.4–10.5)
CO2: 20 meq/L (ref 19–32)
Chloride: 110 mEq/L (ref 96–112)
Creatinine, Ser: 0.63 mg/dL (ref 0.50–1.10)
GFR, EST NON AFRICAN AMERICAN: 87 mL/min — AB (ref >90)
GLUCOSE: 128 mg/dL — AB (ref 70–99)
Potassium: 3.5 mEq/L — ABNORMAL LOW (ref 3.7–5.3)
Sodium: 142 mEq/L (ref 137–147)
Total Bilirubin: 0.3 mg/dL (ref 0.3–1.2)
Total Protein: 5.3 g/dL — ABNORMAL LOW (ref 6.0–8.3)

## 2013-08-12 LAB — CBC
HEMATOCRIT: 31.3 % — AB (ref 36.0–46.0)
HEMOGLOBIN: 10.4 g/dL — AB (ref 12.0–15.0)
MCH: 32.1 pg (ref 26.0–34.0)
MCHC: 33.2 g/dL (ref 30.0–36.0)
MCV: 96.6 fL (ref 78.0–100.0)
Platelets: 149 10*3/uL — ABNORMAL LOW (ref 150–400)
RBC: 3.24 MIL/uL — AB (ref 3.87–5.11)
RDW: 13.3 % (ref 11.5–15.5)
WBC: 7.6 10*3/uL (ref 4.0–10.5)

## 2013-08-12 LAB — GLUCOSE, CAPILLARY
Glucose-Capillary: 106 mg/dL — ABNORMAL HIGH (ref 70–99)
Glucose-Capillary: 122 mg/dL — ABNORMAL HIGH (ref 70–99)
Glucose-Capillary: 123 mg/dL — ABNORMAL HIGH (ref 70–99)
Glucose-Capillary: 129 mg/dL — ABNORMAL HIGH (ref 70–99)
Glucose-Capillary: 130 mg/dL — ABNORMAL HIGH (ref 70–99)
Glucose-Capillary: 98 mg/dL (ref 70–99)

## 2013-08-12 MED ORDER — PROMETHAZINE HCL 25 MG/ML IJ SOLN
6.2500 mg | Freq: Four times a day (QID) | INTRAMUSCULAR | Status: DC | PRN
  Administered 2013-08-13 – 2013-08-23 (×13): 12.5 mg via INTRAVENOUS
  Administered 2013-08-25: 6.25 mg via INTRAVENOUS
  Administered 2013-08-27 (×2): 12.5 mg via INTRAVENOUS
  Administered 2013-08-27: 6.25 mg via INTRAVENOUS
  Administered 2013-08-28 (×2): 12.5 mg via INTRAVENOUS
  Administered 2013-08-29: 6.25 mg via INTRAVENOUS
  Administered 2013-08-29 – 2013-09-02 (×9): 12.5 mg via INTRAVENOUS
  Filled 2013-08-12 (×29): qty 1

## 2013-08-12 MED ORDER — SCOPOLAMINE 1 MG/3DAYS TD PT72
1.0000 | MEDICATED_PATCH | TRANSDERMAL | Status: DC | PRN
  Administered 2013-08-12 – 2013-09-09 (×4): 1.5 mg via TRANSDERMAL
  Filled 2013-08-12 (×4): qty 1

## 2013-08-12 NOTE — Progress Notes (Signed)
Patient ID: [name], female   DOB: [birth year], 73 y.o.   MRN: [id number] 5 Days Post-Op  Subjective: Still quite nauseated.  Also having trouble voiding.     Objective: Vital signs in last 24 hours: Temp:  [98 F (36.7 C)-98.9 F (37.2 C)] 98.3 F (36.8 C) (03/16 0841) Pulse Rate:  [85-92] 85 (03/16 0841) Resp:  [17-28] 20 (03/16 0841) BP: (133-150)/(61-75) 133/61 mmHg (03/16 0921) SpO2:  [97 %-100 %] 97 % (03/16 0921)    Intake/Output from previous day: 03/15 0701 - 03/16 0700 In: 1125 [I.V.:1125] Out: 2185 [Urine:1775; Emesis/NG output:150; Drains:260] Intake/Output this shift: Total I/O In: -  Out: 76 [Drains:90]  PE: Gen:  Alert, NAD, pleasant Card:  RRR, no M/G/R heard Pulm:  CTA, no W/R/R Abd: Soft, mild distension, mild tenderness over incisions sites, +BS, no HSM, incisions C/D/I with staples in place, drains 1&2 with serosanguinous drainage, drain sites and incisions without signs of infection infection   Lab Results:   Recent Labs  08/11/13 0253 08/12/13 0328  WBC 7.7 7.6  HGB 10.7* 10.4*  HCT 32.7* 31.3*  PLT 155 149*   BMET  Recent Labs  08/11/13 0253 08/12/13 0328  NA 141 142  K 3.8 3.5*  CL 108 110  CO2 21 20  GLUCOSE 117* 128*  BUN 8 8  CREATININE 0.66 0.63  CALCIUM 8.2* 8.1*   PT/INR No results found for this basename: LABPROT, INR,  in the last 72 hours CMP     Component Value Date/Time   NA 142 08/12/2013 0328   K 3.5* 08/12/2013 0328   CL 110 08/12/2013 0328   CO2 20 08/12/2013 0328   GLUCOSE 128* 08/12/2013 0328   BUN 8 08/12/2013 0328   CREATININE 0.63 08/12/2013 0328   CREATININE 0.81 07/01/2013 1324   CALCIUM 8.1* 08/12/2013 0328   PROT 5.3* 08/12/2013 0328   ALBUMIN 2.3* 08/12/2013 0328   AST 30 08/12/2013 0328   ALT 39* 08/12/2013 0328   ALKPHOS 154* 08/12/2013 0328   BILITOT 0.3 08/12/2013 0328   GFRNONAA 87* 08/12/2013 0328   GFRAA >90 08/12/2013 0328   Lipase  No results found for this basename: lipase        Studies/Results: No results found.  Anti-infectives: Anti-infectives   Start     Dose/Rate Route Frequency Ordered Stop   08/08/13 0800  ertapenem (INVANZ) 1 g in sodium chloride 0.9 % 50 mL IVPB     1 g 100 mL/hr over 30 Minutes Intravenous Every 24 hours 08/07/13 1559 08/08/13 0913   08/07/13 0600  ertapenem (INVANZ) 1 g in sodium chloride 0.9 % 50 mL IVPB     1 g 100 mL/hr over 30 Minutes Intravenous On call to O.R. 08/06/13 1431 08/07/13 0829       Assessment/Plan POD 5 s/p diagnostic laparoscopy and whipple procedure  Plan: 1.  SCD's, mobilization 2.  If unable to void, replace foley 3.  HTN - on metoprolol  4.  Pulmonary toilet  5.  Work on pain control  6.  Nausea - add scopolamine and prn phenergan 7.  Hyperglycemia - SSI.  8.  On home meds  Disp:  SNF vs home with son after d/c.    Transfer to floor. PICC/tna if still nauseated tomorrow.      LOS: 5 days    Daquon Greenleaf 08/12/2013, 10:06 AM Pager: [phone]

## 2013-08-12 NOTE — Evaluation (Signed)
Occupational Therapy Evaluation Patient Details Name: [name] MRN: [id number] DOB: 05/19/1941 Today's Date: 08/12/2013 Time: 3235-5732 OT Time Calculation (min): 35 min  OT Assessment / Plan / Recommendation History of present illness admitted with adenocarcinoma of the pancreatic head, s/p whipple procedure, stenting and diagnostic lap.   Clinical Impression   This 73 yo female admitted and underwent above presents to acute OT with increased pain, decreased activity tolerance, decreased mobility all affecting pt's ability to care for herself at home (she lives alone). Will benefit from acute OT with follow up OT at SNF.    OT Assessment  Patient needs continued OT Services    Follow Up Recommendations  SNF    Barriers to Discharge Decreased caregiver support    Equipment Recommendations  3 in 1 bedside comode       Frequency  Min 2X/week    Precautions / Restrictions Precautions Precautions: Fall Precaution Comments: adominal wounds   Pertinent Vitals/Pain 6/10 abdomen with activity, burning, repositioned-ice applied-RN made aware    ADL  Eating/Feeding: Set up;Supervision/safety Where Assessed - Eating/Feeding: Chair Grooming: Set up;Supervision/safety Where Assessed - Grooming: Supported sitting Upper Body Bathing: Minimal assistance Where Assessed - Upper Body Bathing: Supported sitting Lower Body Bathing: +1 Total assistance Where Assessed - Lower Body Bathing: Supported sit to stand Upper Body Dressing: Moderate assistance Where Assessed - Upper Body Dressing: Supported sitting Lower Body Dressing: +1 Total assistance Where Assessed - Lower Body Dressing: Supported sit to Lobbyist: Moderate assistance;+2 Total assistance Toilet Transfer: Patient Percentage: 50% Toilet Transfer Method: Sit to stand Toilet Transfer Equipment: Comfort height toilet;Grab bars Toileting - Clothing Manipulation and Hygiene: +1 Total assistance Where Assessed -  Toileting Clothing Manipulation and Hygiene: Sit to stand from 3-in-1 or toilet Equipment Used: Gait belt;Rolling walker Transfers/Ambulation Related to ADLs: Min A sit<>stand from bed; total A +2 (mod A) from comfort height toilet    OT Diagnosis: Generalized weakness;Acute pain  OT Problem List: Decreased strength;Decreased activity tolerance;Impaired balance (sitting and/or standing);Pain;Obesity;Decreased knowledge of use of DME or AE;Decreased cognition OT Treatment Interventions: Self-care/ADL training;Balance training;Patient/family education;Therapeutic activities;DME and/or AE instruction   OT Goals(Current goals can be found in the care plan section) Acute Rehab OT Goals Patient Stated Goal: ultimately home OT Goal Formulation: With patient Time For Goal Achievement: 08/26/13 Potential to Achieve Goals: Good  Visit Information  Last OT Received On: 08/12/13 Assistance Needed: +2 (for lines/tubes) PT/OT/SLP Co-Evaluation/Treatment: Yes Reason for Co-Treatment: Complexity of the patient's impairments (multi-system involvement) OT goals addressed during session: ADL's and self-care History of Present Illness: admitted with adenocarcinoma of the pancreatic head, s/p whipple procedure, stenting and diagnostic lap.       Prior [place] expects to be discharged to:: Private residence Living Arrangements: Alone Type of Home: House Home Access: Stairs to enter [employer] of Steps: 2 Entrance Stairs-Rails: None Home Layout: One level Home Equipment: Environmental consultant - 2 wheels;Cane - single point;Bedside commode;Tub bench Prior Function Level of Independence: Independent Communication Communication: No difficulties Dominant Hand: Right         Vision/Perception Vision - History Patient Visual Report: No change from baseline   Cognition  Cognition Arousal/Alertness: Awake/alert Behavior During Therapy: WFL for tasks  assessed/performed Overall Cognitive Status: Impaired/Different from baseline Area of Impairment: Memory Current Attention Level: Sustained General Comments: Had a difficult time telling us if and what kind of tub equipment she had as well as if she has a history of restless  leg symdrome (at rest her legs are constantly moving--isometric contractions)    Extremity/Trunk Assessment Upper Extremity Assessment Upper Extremity Assessment: Overall WFL for tasks assessed     Mobility Bed Mobility Overal bed mobility: Needs Assistance Bed Mobility: Rolling;Sidelying to Sit Rolling: Mod assist Sidelying to sit: +2 for physical assistance;Mod assist General bed mobility comments: Cues to log roll and use sidelie to sit technique to decr force across abdomen and incision; Physical assist, in particular to elevate trunk from bed Transfers Overall transfer level: Needs assistance Equipment used: Rolling walker (2 wheeled) Transfers: Sit to/from Stand Sit to Stand: Min assist;Mod assist;+2 physical assistance General transfer comment: Min assist with sit to stand from bed; Mod lift assist of 2 with sit<stand from low commode; Painful and fatigued at time of standing from commode        Balance Balance Overall balance assessment: Needs assistance Sitting-balance support: Single extremity supported (at times alternating single UE support) Sitting balance-Leahy Scale: Fair Sitting balance - Comments: Pt seems most comfortable with UE support Standing balance support: Bilateral upper extremity supported Standing balance-Leahy Scale: Poor Standing balance comment: Relies on UE support   End of Session OT - End of Session Equipment Utilized During Treatment: Gait belt;Rolling walker Activity Tolerance: Patient limited by fatigue;Patient limited by pain Patient left: in chair Nurse Communication:  (left O2 off due to sats 97% on RA)       Almon Register [phone] 08/12/2013, 9:36 AM

## 2013-08-12 NOTE — Progress Notes (Signed)
Physical Therapy Treatment Patient Details Name: [name] MRN: [id number] DOB: Jul 07, 1940 Today's Date: 08/12/2013 Time: 3220-2542 PT Time Calculation (min): 35 min  PT Assessment / Plan / Recommendation  History of Present Illness admitted with adenocarcinoma of the pancreatic head, s/p whipple procedure, stenting and diagnostic lap.   PT Comments   Making slow progress with mobility, but willing to participate, despite pain; continues to need physical lifting assist for sit<>stand transfers, especially from low commode;   Continue to recommend SNF for postacute rehab   Follow Up Recommendations  SNF;Supervision for mobility/OOB     Does the patient have the potential to tolerate intense rehabilitation     Barriers to Discharge        Equipment Recommendations  None recommended by PT    Recommendations for Other Services    Frequency Min 3X/week   Progress towards PT Goals Progress towards PT goals: Progressing toward goals (though quite slowly)  Plan Current plan remains appropriate    Precautions / Restrictions Precautions Precautions: Fall Precaution Comments: adominal wounds   Pertinent Vitals/Pain Reported 10/10 burning pain near incision; Enough pain to limit her ambulation, and require bringing chair to pt patient repositioned for comfort elevated for edema and pain control RN aware and getting meds at end of session  O2 sats remained greater than or equal to 96% throughout session on Room Air BP 133/61 end of session in chair    Mobility  Bed Mobility Overal bed mobility: Needs Assistance Bed Mobility: Rolling;Sidelying to Sit Rolling: Mod assist Sidelying to sit: +2 for physical assistance;Mod assist General bed mobility comments: Cues to log roll and use sidelie to sit technique to decr force across abdomen and incision; Physical assist, in particular to elevate trunk from bed Transfers Overall transfer level: Needs assistance Equipment used:  Rolling walker (2 wheeled) Transfers: Sit to/from Stand Sit to Stand: Min assist;Mod assist General transfer comment: Min assist with sit to stand from bed; Mod lift assist of 2 with sit<stand from low commode; Painful and fatigued at time of standing from commode Ambulation/Gait Ambulation/Gait assistance: +2 safety/equipment;Min assist Ambulation Distance (Feet): 24 Feet (20 to bathroom; 4 from commode to chair brought to her after using commode) Assistive device: Rolling walker (2 wheeled) (w/c) Gait Pattern/deviations: Decreased step length - right;Decreased step length - left Gait velocity: decr Gait velocity interpretation: Below normal speed for age/gender General Gait Details: Cues for posture and to keep RW close for maximal support; min assist for maneuvering RW, especially when pt was fatigued post time on commode    Exercises     PT Diagnosis:    PT Problem List:   PT Treatment Interventions:     PT Goals (current goals can now be found in the care plan section) Acute Rehab PT Goals Patient Stated Goal: ultimately home PT Goal Formulation: With patient Time For Goal Achievement: 08/23/13 Potential to Achieve Goals: Good  Visit Information  Last PT Received On: 08/12/13 Assistance Needed: +2 (for lines/tubes) Reason for Co-Treatment: Complexity of the patient's impairments (multi-system involvement) OT goals addressed during session: ADL's and self-care History of Present Illness: admitted with adenocarcinoma of the pancreatic head, s/p whipple procedure, stenting and diagnostic lap.    Subjective Data  Subjective: Agreeable to getting up; Needing to void and hoping to avoid in/out cath Patient Stated Goal: ultimately home   Cognition  Cognition Arousal/Alertness: Awake/alert Behavior During Therapy: WFL for tasks assessed/performed Overall Cognitive Status: Impaired/Different from baseline Area of Impairment: Memory Current Attention  Level: Sustained General  Comments: Had a difficult time telling us if and what kind of tub equipment she had as well as if she has a history of restless leg symdrome (at rest her legs are constantly moving--isometric contractions)    Balance  Balance Overall balance assessment: Needs assistance Sitting-balance support: Single extremity supported (at times alternating single UE support) Sitting balance-Leahy Scale: Fair Sitting balance - Comments: Pt seems most comfortable with UE support Standing balance support: Bilateral upper extremity supported Standing balance-Leahy Scale: Poor Standing balance comment: Relies on UE support  End of Session PT - End of Session Equipment Utilized During Treatment: Gait belt (belt at level of axillae) Activity Tolerance: Patient limited by fatigue;Patient limited by pain Patient left: with call bell/phone within reach;in chair Nurse Communication: Mobility status   GP     [name] 08/12/2013, 9:31 AM [name], [place] Pager [phone] Office [phone]

## 2013-08-12 NOTE — Progress Notes (Signed)
On bsc trying to void.  Voided 100 cc's dark amber malodorous urine.  Bladder scan done and show 500 cc's post void.  Number 12 Fr. foley cath placed without difficulty.  Patient tolerated with event.  500 cc's out immediately.  Continue to watch.

## 2013-08-12 NOTE — Progress Notes (Signed)
Utilization review completed.  

## 2013-08-13 ENCOUNTER — Telehealth (INDEPENDENT_AMBULATORY_CARE_PROVIDER_SITE_OTHER): Payer: Self-pay | Admitting: *Deleted

## 2013-08-13 LAB — COMPREHENSIVE METABOLIC PANEL
ALK PHOS: 194 U/L — AB (ref 39–117)
ALT: 36 U/L — ABNORMAL HIGH (ref 0–35)
AST: 27 U/L (ref 0–37)
Albumin: 2.3 g/dL — ABNORMAL LOW (ref 3.5–5.2)
BILIRUBIN TOTAL: 0.3 mg/dL (ref 0.3–1.2)
BUN: 9 mg/dL (ref 6–23)
CHLORIDE: 108 meq/L (ref 96–112)
CO2: 20 meq/L (ref 19–32)
Calcium: 8.3 mg/dL — ABNORMAL LOW (ref 8.4–10.5)
Creatinine, Ser: 0.63 mg/dL (ref 0.50–1.10)
GFR calc Af Amer: >90 mL/min (ref >90)
GFR, EST NON AFRICAN AMERICAN: 87 mL/min — AB (ref >90)
Glucose, Bld: 127 mg/dL — ABNORMAL HIGH (ref 70–99)
POTASSIUM: 3.5 meq/L — AB (ref 3.7–5.3)
SODIUM: 141 meq/L (ref 137–147)
Total Protein: 5.6 g/dL — ABNORMAL LOW (ref 6.0–8.3)

## 2013-08-13 LAB — CBC
HCT: 31.6 % — ABNORMAL LOW (ref 36.0–46.0)
HEMOGLOBIN: 10.8 g/dL — AB (ref 12.0–15.0)
MCH: 32.6 pg (ref 26.0–34.0)
MCHC: 34.2 g/dL (ref 30.0–36.0)
MCV: 95.5 fL (ref 78.0–100.0)
PLATELETS: 163 10*3/uL (ref 150–400)
RBC: 3.31 MIL/uL — AB (ref 3.87–5.11)
RDW: 13.2 % (ref 11.5–15.5)
WBC: 7.1 10*3/uL (ref 4.0–10.5)

## 2013-08-13 LAB — URINALYSIS, ROUTINE W REFLEX MICROSCOPIC
BILIRUBIN URINE: NEGATIVE
Glucose, UA: NEGATIVE mg/dL
Hgb urine dipstick: NEGATIVE
Ketones, ur: NEGATIVE mg/dL
Leukocytes, UA: NEGATIVE
Nitrite: NEGATIVE
Protein, ur: NEGATIVE mg/dL
SPECIFIC GRAVITY, URINE: 1.01 (ref 1.005–1.030)
UROBILINOGEN UA: 1 mg/dL (ref 0.0–1.0)
pH: 8 (ref 5.0–8.0)

## 2013-08-13 LAB — GLUCOSE, CAPILLARY
Glucose-Capillary: 116 mg/dL — ABNORMAL HIGH (ref 70–99)
Glucose-Capillary: 122 mg/dL — ABNORMAL HIGH (ref 70–99)
Glucose-Capillary: 105 mg/dL — ABNORMAL HIGH (ref 70–99)
Glucose-Capillary: 125 mg/dL — ABNORMAL HIGH (ref 70–99)
Glucose-Capillary: 110 mg/dL — ABNORMAL HIGH (ref 70–99)

## 2013-08-13 MED ORDER — POTASSIUM CHLORIDE CRYS ER 20 MEQ PO TBCR
40.0000 meq | EXTENDED_RELEASE_TABLET | Freq: Every day | ORAL | Status: DC
  Administered 2013-08-13 – 2013-08-14 (×2): 40 meq via ORAL
  Filled 2013-08-13 (×2): qty 2

## 2013-08-13 NOTE — Anesthesia Postprocedure Evaluation (Signed)
  Anesthesia Post-op Note  Patient: [name]  Procedure(s) Performed: Procedure(s): LAPAROSCOPY DIAGNOSTIC (N/A) WHIPPLE PROCEDURE (N/A)  Patient Location: PACU  Anesthesia Type:General  Level of Consciousness: awake, alert  and oriented  Airway and Oxygen Therapy: Patient Spontanous Breathing and Patient connected to nasal cannula oxygen  Post-op Pain: mild  Post-op Assessment: Post-op Vital signs reviewed, Patient's Cardiovascular Status Stable, Respiratory Function Stable, Patent Airway and Pain level controlled  Post-op Vital Signs: stable  Complications: No apparent anesthesia complications

## 2013-08-13 NOTE — Clinical Social Work Note (Signed)
CSW received handoff from pt's previous unit CSW stating that pt and pt's family would like for pt to be placed at [facility]. CSW contacted [facility] to inform admissions liaison of admission once medically stable. CSW to continue to follow and assist with discharge planning needs once medically stable for discharge.  Pati Gallo, [place] Social Worker [phone]

## 2013-08-13 NOTE — Progress Notes (Signed)
Patient ID: [name], female   DOB: [birth year], 73 y.o.   MRN: [id number] 6 Days Post-Op  Subjective: Had to have foley reinserted for retention.  nausea improved.     Objective: Vital signs in last 24 hours: Temp:  [97.9 F (36.6 C)-99 F (37.2 C)] 98.3 F (36.8 C) (03/17 0526) Pulse Rate:  [80-94] 85 (03/17 0526) Resp:  [15-20] 19 (03/17 0526) BP: (93-165)/(62-82) 138/82 mmHg (03/17 0526) SpO2:  [95 %-100 %] 96 % (03/17 0526) Last BM Date:  (prior to admission)  Intake/Output from previous day: 03/16 0701 - 03/17 0700 In: 1470 [I.V.:1470] Out: 2200 [Urine:1900; Drains:300] Intake/Output this shift:    PE: Gen:  Alert, NAD, pleasant Card:  RRR Pulm:  Breathing comfortably Abd: Soft, non distended mild tenderness over incisions sites,  incisions C/D/I with staples in place, drains 1&2 with serosanguinous drainage, drain sites and incisions without signs of infection infection   Lab Results:   Recent Labs  08/12/13 0328 08/13/13 0712  WBC 7.6 7.1  HGB 10.4* 10.8*  HCT 31.3* 31.6*  PLT 149* 163   BMET  Recent Labs  08/12/13 0328 08/13/13 0712  NA 142 141  K 3.5* 3.5*  CL 110 108  CO2 20 20  GLUCOSE 128* 127*  BUN 8 9  CREATININE 0.63 0.63  CALCIUM 8.1* 8.3*   PT/INR No results found for this basename: LABPROT, INR,  in the last 72 hours CMP     Component Value Date/Time   NA 141 08/13/2013 0712   K 3.5* 08/13/2013 0712   CL 108 08/13/2013 0712   CO2 20 08/13/2013 0712   GLUCOSE 127* 08/13/2013 0712   BUN 9 08/13/2013 0712   CREATININE 0.63 08/13/2013 0712   CREATININE 0.81 07/01/2013 1324   CALCIUM 8.3* 08/13/2013 0712   PROT 5.6* 08/13/2013 0712   ALBUMIN 2.3* 08/13/2013 0712   AST 27 08/13/2013 0712   ALT 36* 08/13/2013 0712   ALKPHOS 194* 08/13/2013 0712   BILITOT 0.3 08/13/2013 0712   GFRNONAA 87* 08/13/2013 0712   GFRAA >90 08/13/2013 0712   Lipase  No results found for this basename: lipase       Studies/Results: No results  found.  Anti-infectives: Anti-infectives   Start     Dose/Rate Route Frequency Ordered Stop   08/08/13 0800  ertapenem (INVANZ) 1 g in sodium chloride 0.9 % 50 mL IVPB     1 g 100 mL/hr over 30 Minutes Intravenous Every 24 hours 08/07/13 1559 08/08/13 0913   08/07/13 0600  ertapenem (INVANZ) 1 g in sodium chloride 0.9 % 50 mL IVPB     1 g 100 mL/hr over 30 Minutes Intravenous On call to O.R. 08/06/13 1431 08/07/13 0829       Assessment/Plan POD 5 s/p diagnostic laparoscopy and whipple procedure  Plan: 1.  SCD's, mobilization 2. Leave foley for urinary retention, check U/A 3.  HTN - on metoprolol  4.  Pulmonary toilet  5.  Work on pain control  6.  Nausea - scop patch and prn phenergan 7.  Hyperglycemia - SSI.  8.  On home meds  Disp:  SNF vs home with son after d/c.    Full liquid diet. Hypokalemia - replete    LOS: 6 days    [name] 08/13/2013, 9:33 AM Pager: [phone]

## 2013-08-13 NOTE — Telephone Encounter (Signed)
Patient's son called asking for the surg path results.  He asked that we call his wife at [phone] due to he is at the airport [place] of the country at this time.  Explained that I would send a message to Dr. [name] to ask for her review and interpretation then we will let them know.  Son states understanding at this time.

## 2013-08-14 ENCOUNTER — Telehealth (INDEPENDENT_AMBULATORY_CARE_PROVIDER_SITE_OTHER): Payer: Self-pay

## 2013-08-14 ENCOUNTER — Telehealth (INDEPENDENT_AMBULATORY_CARE_PROVIDER_SITE_OTHER): Payer: Self-pay | Admitting: General Surgery

## 2013-08-14 LAB — COMPREHENSIVE METABOLIC PANEL
ALT: 38 U/L — ABNORMAL HIGH (ref 0–35)
AST: 26 U/L (ref 0–37)
Albumin: 2.6 g/dL — ABNORMAL LOW (ref 3.5–5.2)
Alkaline Phosphatase: 214 U/L — ABNORMAL HIGH (ref 39–117)
BUN: 9 mg/dL (ref 6–23)
CALCIUM: 8.7 mg/dL (ref 8.4–10.5)
CO2: 21 meq/L (ref 19–32)
CREATININE: 0.7 mg/dL (ref 0.50–1.10)
Chloride: 105 mEq/L (ref 96–112)
GFR calc Af Amer: >90 mL/min (ref >90)
GFR, EST NON AFRICAN AMERICAN: 84 mL/min — AB (ref >90)
Glucose, Bld: 130 mg/dL — ABNORMAL HIGH (ref 70–99)
Potassium: 3.7 mEq/L (ref 3.7–5.3)
SODIUM: 141 meq/L (ref 137–147)
TOTAL PROTEIN: 6.1 g/dL (ref 6.0–8.3)
Total Bilirubin: 0.3 mg/dL (ref 0.3–1.2)

## 2013-08-14 LAB — GLUCOSE, CAPILLARY
Glucose-Capillary: 117 mg/dL — ABNORMAL HIGH (ref 70–99)
Glucose-Capillary: 120 mg/dL — ABNORMAL HIGH (ref 70–99)
Glucose-Capillary: 125 mg/dL — ABNORMAL HIGH (ref 70–99)
Glucose-Capillary: 129 mg/dL — ABNORMAL HIGH (ref 70–99)
Glucose-Capillary: 130 mg/dL — ABNORMAL HIGH (ref 70–99)
Glucose-Capillary: 136 mg/dL — ABNORMAL HIGH (ref 70–99)
Glucose-Capillary: 137 mg/dL — ABNORMAL HIGH (ref 70–99)

## 2013-08-14 LAB — MAGNESIUM: Magnesium: 2.3 mg/dL (ref 1.5–2.5)

## 2013-08-14 LAB — PHOSPHORUS: Phosphorus: 4.7 mg/dL — ABNORMAL HIGH (ref 2.3–4.6)

## 2013-08-14 MED ORDER — SODIUM CHLORIDE 0.45 % IV SOLN
INTRAVENOUS | Status: AC
  Administered 2013-08-14: 18:00:00 via INTRAVENOUS

## 2013-08-14 MED ORDER — FAT EMULSION 20 % IV EMUL
240.0000 mL | INTRAVENOUS | Status: AC
  Administered 2013-08-14: 240 mL via INTRAVENOUS
  Filled 2013-08-14: qty 250

## 2013-08-14 MED ORDER — TRACE MINERALS CR-CU-F-FE-I-MN-MO-SE-ZN IV SOLN
INTRAVENOUS | Status: AC
  Administered 2013-08-14: 18:00:00 via INTRAVENOUS
  Filled 2013-08-14: qty 1000

## 2013-08-14 MED ORDER — SODIUM CHLORIDE 0.9 % IJ SOLN
10.0000 mL | INTRAMUSCULAR | Status: DC | PRN
  Administered 2013-08-20 – 2013-08-27 (×4): 10 mL
  Administered 2013-08-28: 20 mL
  Administered 2013-08-30 – 2013-09-12 (×14): 10 mL

## 2013-08-14 NOTE — Telephone Encounter (Signed)
(  2nd request) Pt son [name] requesting a call with path report. Advised I would send msg to Dr [name] to review and would call him back at [phone]. (

## 2013-08-14 NOTE — Progress Notes (Signed)
Patient ID: [name], female   DOB: 26-May-1941, 73 y.o.   MRN: [id number] 7 Days Post-Op   Subjective: Worse nausea.     Objective: Vital signs in last 24 hours: Temp:  [98.2 F (36.8 C)-98.9 F (37.2 C)] 98.2 F (36.8 C) (03/18 1034) Pulse Rate:  [77-93] 84 (03/18 1034) Resp:  [16-18] 16 (03/18 1034) BP: (126-155)/(58-82) 155/82 mmHg (03/18 1034) SpO2:  [95 %-99 %] 98 % (03/18 1034) Last BM Date: 08/12/13  Intake/Output from previous day: 03/17 0701 - 03/18 0700 In: 2110.5 [P.O.:185; I.V.:1925.5] Out: 1670 [Urine:1475; Drains:195] Intake/Output this shift: Total I/O In: 120 [P.O.:120] Out: -   PE: Gen:  Alert, NAD, pleasant Card:  RRR Pulm:  Breathing comfortably Abd: Soft, non distended mild tenderness over incisions sites,  incisions C/D/I with staples in place, drains 1&2 with serosanguinous drainage, drain sites and incisions without signs of infection infection   Lab Results:   Recent Labs  08/12/13 0328 08/13/13 0712  WBC 7.6 7.1  HGB 10.4* 10.8*  HCT 31.3* 31.6*  PLT 149* 163   BMET  Recent Labs  08/13/13 0712 08/14/13 0602  NA 141 141  K 3.5* 3.7  CL 108 105  CO2 20 21  GLUCOSE 127* 130*  BUN 9 9  CREATININE 0.63 0.70  CALCIUM 8.3* 8.7   PT/INR No results found for this basename: LABPROT, INR,  in the last 72 hours CMP     Component Value Date/Time   NA 141 08/14/2013 0602   K 3.7 08/14/2013 0602   CL 105 08/14/2013 0602   CO2 21 08/14/2013 0602   GLUCOSE 130* 08/14/2013 0602   BUN 9 08/14/2013 0602   CREATININE 0.70 08/14/2013 0602   CREATININE 0.81 07/01/2013 1324   CALCIUM 8.7 08/14/2013 0602   PROT 6.1 08/14/2013 0602   ALBUMIN 2.6* 08/14/2013 0602   AST 26 08/14/2013 0602   ALT 38* 08/14/2013 0602   ALKPHOS 214* 08/14/2013 0602   BILITOT 0.3 08/14/2013 0602   GFRNONAA 84* 08/14/2013 0602   GFRAA >90 08/14/2013 0602   Lipase  No results found for this basename: lipase       Studies/Results: No results  found.  Anti-infectives: Anti-infectives   Start     Dose/Rate Route Frequency Ordered Stop   08/08/13 0800  ertapenem (INVANZ) 1 g in sodium chloride 0.9 % 50 mL IVPB     1 g 100 mL/hr over 30 Minutes Intravenous Every 24 hours 08/07/13 1559 08/08/13 0913   08/07/13 0600  ertapenem (INVANZ) 1 g in sodium chloride 0.9 % 50 mL IVPB     1 g 100 mL/hr over 30 Minutes Intravenous On call to O.R. 08/06/13 1431 08/07/13 0829       Assessment/Plan POD 6 s/p diagnostic laparoscopy and whipple procedure  Plan: 1.  SCD's, mobilization 2. Remove foley again tomorrow. 3.  HTN - on metoprolol  4.  Pulmonary toilet  5.  Work on pain control  6.  Nausea - scop patch and prn phenergan- worse today, add PICC and TNA.   7.  Hyperglycemia - SSI.  8.  On home meds Amylase via drains pending.   Disp:  SNF vs home with son after d/c.    Full liquid diet as tolerated.   Hypokalemia - replete orally, better now.      LOS: 7 days    Adena Sima 08/14/2013, 11:22 AM Pager: [phone]

## 2013-08-14 NOTE — Progress Notes (Signed)
PARENTERAL NUTRITION CONSULT NOTE - INITIAL  Pharmacy Consult for TPN Indication: intolerance to enteral feeding  Allergies  Allergen Reactions  . Celecoxib Other (See Comments)    unknown  . Glucosamine     unknown  . Metoclopramide Hcl Other (See Comments)    shaking  . Niacin Other (See Comments)    shaking  . 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 Phenazopyridine Hcl Other (See Comments)    unknown  . Sulfonamide Derivatives Nausea And Vomiting  . Trovan [Alatrofloxacin Mesylate] Other (See Comments)    Caused shaking and nervousness  . Benzocaine-Resorcinol Rash  . Sulfa Antibiotics Rash    Patient Measurements: Height: 5' 2" (157.5 cm) Weight: 162 lb 11.2 oz (73.8 kg) IBW/kg (Calculated) : 50.1  Vital Signs: Temp: 98.2 F (36.8 C) (03/18 1034) Temp src: Oral (03/18 1034) BP: 155/82 mmHg (03/18 1034) Pulse Rate: 84 (03/18 1034) Intake/Output from previous day: 03/17 0701 - 03/18 0700 In: 2110.5 [P.O.:185; I.V.:1925.5] Out: 1670 [Urine:1475; Drains:195] Intake/Output from this shift: Total I/O In: 120 [P.O.:120] Out: -   Labs:  Recent Labs  08/12/13 0328 08/13/13 0712  WBC 7.6 7.1  HGB 10.4* 10.8*  HCT 31.3* 31.6*  PLT 149* 163     Recent Labs  08/12/13 0328 08/13/13 0712 08/14/13 0602  NA 142 141 141  K 3.5* 3.5* 3.7  CL 110 108 105  CO2 20 20 21  GLUCOSE 128* 127* 130*  BUN 8 9 9  CREATININE 0.63 0.63 0.70  CALCIUM 8.1* 8.3* 8.7  PROT 5.3* 5.6* 6.1  ALBUMIN 2.3* 2.3* 2.6*  AST 30 27 26  ALT 39* 36* 38*  ALKPHOS 154* 194* 214*  BILITOT 0.3 0.3 0.3   Estimated Creatinine Clearance: 58.9 ml/min (by C-G formula based on Cr of 0.7).    Recent Labs  08/14/13 08/14/13 0350 08/14/13 0754  GLUCAP 129* 120* 130*    Medical History: Past Medical History  Diagnosis Date  . Benign neoplasm of colon   . Osteoarthritis   . Hiatal hernia   . Diarrhea   . Back pain   . Chronic maxillary  sinusitis   . Depression   . Eustachian tube dysfunction   . Tardive dyskinesia   . Mitral valve prolapse   . Osteoarthritis   . GERD (gastroesophageal reflux disease)   . Allergic rhinitis   . Glaucoma   . Hypothyroid   . Hypertension   . Heart murmur     hx. "MVP" -predental antibiotics  . Memory loss   . Cancer     Pancreatic cancer with MRI scan 06-19-13  . Urgency of urination    Insulin Requirements in the past 24 hours:  3 units SSI; CBGs 110-130  Current Nutrition:  -Full liquid diet- 400mL charted as consumed yesterday, only 120 so far today -D5/half NS with 20mEq KCl at 50mL/hr provides 240kcal from dextrose in 24 hour period  Nutritional Goals:   1500 kCal, 75 grams of protein per day based on population estimate calculations  Assessment:   72 YOF with pancreatic cancer s/p Whipple procedure on 3/11. She pulled out her NGT on 3/13 and then has demonstrated ability to swallow. Was trialed on clears, but has had significant nausea not responding to scopolamine and Phenergan.   GI: difficult to place tube if patient is still vomiting as risk for aspiration high. A week out from her procedure. Lat BM charted as 3/16.  Endo: A1C   5.2 this admission. CBGs have been decently controlled after surgery with SSI only. TSH was normal in January- she has been on IV Synthroid 131mg (home dose is 1717m).  Lytes: Na 130, K 3.7, Cl 105, CorCa 9.8. 3/12 phos 3.1 and mag 1.8. Is on scheduled KCl tablets- 4021mdaily  Renal: SCr 0.7 with est CrCl ~64m43mn. UOP 0.8mL/76mhr  Pulm: RA  Cards: hx HTN and HLD as well as mitral valve prolapse; BP elevated, HR nml. Receiving IV scheduled metoprolol- other home medications have not yet been resumed.   Hepatobil: alk phos elevated at 214, ALT slightly elevated at 38, albumin low at 2.6. Other LFTs wnl.  Neuro: hx anxiety and depression on gabapentin, topiramate and Prozac.  ID: no issues at this time. WBC normal, afebrile. Not on  antibiotics  Best Practices: subQ heparin, IV PPI  TPN Access: PICC to be placed 3/18 TPN day#: 0 (start 3/18)  Plan:  1. Stat phosphorus and magnesium to assess if repletion is needed prior to starting TNA 2. Start Clinimix E 5/15 at 30mL/4m lipid 20% emulsion at 10mL/h46mhis will provide 36g protein and 1008kcal.  3. Daily IV multivtamin; trace elements MWF d/t national shortage 4. Change MIVF to 1/2NS at 30mL/hr47mrting tonight at 1800 so as not to provide excess dextrose and KCl with TPN starting 5. Continue sensitive SSI and CBGs q4h 6. Will stop PO KCl tablet as patient was not on PTA and will be getting electrolytes in her TPN 7. TPN labs as ordered 8. Will follow up RD recommendations and adjust plan as needed. Appreciate their assistance  Cace Osorto D. Zacariah Belue, PharmD, BCPS Clinical Pharmacist Pager: [phone] 12:26 PM

## 2013-08-14 NOTE — Telephone Encounter (Signed)
Discussed pathology with son.  Nodes are negative/2 foci of cancer.  Will present at GI conference next week.

## 2013-08-14 NOTE — Progress Notes (Signed)
Peripherally Inserted Central Catheter/Midline Placement  The IV Nurse has discussed with the patient and/or persons authorized to consent for the patient, the purpose of this procedure and the potential benefits and risks involved with this procedure.  The benefits include less needle sticks, lab draws from the catheter and patient may be discharged home with the catheter.  Risks include, but not limited to, infection, bleeding, blood clot (thrombus formation), and puncture of an artery; nerve damage and irregular heat beat.  Alternatives to this procedure were also discussed.  PICC/Midline Placement Documentation        [name] 08/14/2013, 1:34 PM

## 2013-08-14 NOTE — Progress Notes (Signed)
PT Cancellation Note  Patient Details Name: NEMESIS RAINWATER MRN: [id number] DOB: [birth year]   Cancelled Treatment:    Reason Eval/Treat Not Completed: Medical issues which prohibited therapy. Patient with nausea and vomitting at this time. Requested to hold PT this morning. Will follow up tomorrow   Jacqualyn Posey 08/14/2013, 10:17 AM

## 2013-08-15 ENCOUNTER — Ambulatory Visit: Payer: Medicare Other | Admitting: Nurse Practitioner

## 2013-08-15 LAB — COMPREHENSIVE METABOLIC PANEL
ALBUMIN: 2.7 g/dL — AB (ref 3.5–5.2)
ALT: 43 U/L — ABNORMAL HIGH (ref 0–35)
AST: 33 U/L (ref 0–37)
Alkaline Phosphatase: 237 U/L — ABNORMAL HIGH (ref 39–117)
BUN: 15 mg/dL (ref 6–23)
CO2: 19 mEq/L (ref 19–32)
CREATININE: 0.72 mg/dL (ref 0.50–1.10)
Calcium: 9 mg/dL (ref 8.4–10.5)
Chloride: 102 mEq/L (ref 96–112)
GFR calc Af Amer: >90 mL/min (ref >90)
GFR calc non Af Amer: 83 mL/min — ABNORMAL LOW (ref >90)
Glucose, Bld: 141 mg/dL — ABNORMAL HIGH (ref 70–99)
Potassium: 3.5 mEq/L — ABNORMAL LOW (ref 3.7–5.3)
Sodium: 139 mEq/L (ref 137–147)
TOTAL PROTEIN: 6.4 g/dL (ref 6.0–8.3)
Total Bilirubin: 0.4 mg/dL (ref 0.3–1.2)

## 2013-08-15 LAB — GLUCOSE, CAPILLARY
GLUCOSE-CAPILLARY: 126 mg/dL — AB (ref 70–99)
Glucose-Capillary: 126 mg/dL — ABNORMAL HIGH (ref 70–99)
Glucose-Capillary: 137 mg/dL — ABNORMAL HIGH (ref 70–99)
Glucose-Capillary: 126 mg/dL — ABNORMAL HIGH (ref 70–99)
Glucose-Capillary: 117 mg/dL — ABNORMAL HIGH (ref 70–99)

## 2013-08-15 LAB — DIFFERENTIAL
Basophils Absolute: 0 10*3/uL (ref 0.0–0.1)
Basophils Relative: 0 % (ref 0–1)
EOS ABS: 0.3 10*3/uL (ref 0.0–0.7)
Eosinophils Relative: 2 % (ref 0–5)
LYMPHS PCT: 9 % — AB (ref 12–46)
Lymphs Abs: 1.2 10*3/uL (ref 0.7–4.0)
Monocytes Absolute: 1 10*3/uL (ref 0.1–1.0)
Monocytes Relative: 8 % (ref 3–12)
NEUTROS PCT: 81 % — AB (ref 43–77)
Neutro Abs: 10.2 10*3/uL — ABNORMAL HIGH (ref 1.7–7.7)

## 2013-08-15 LAB — CBC
HCT: 38 % (ref 36.0–46.0)
Hemoglobin: 12.7 g/dL (ref 12.0–15.0)
MCH: 32.2 pg (ref 26.0–34.0)
MCHC: 33.4 g/dL (ref 30.0–36.0)
MCV: 96.4 fL (ref 78.0–100.0)
Platelets: 224 10*3/uL (ref 150–400)
RBC: 3.94 MIL/uL (ref 3.87–5.11)
RDW: 13.3 % (ref 11.5–15.5)
WBC: 12.7 10*3/uL — ABNORMAL HIGH (ref 4.0–10.5)

## 2013-08-15 LAB — PREALBUMIN: Prealbumin: 18 mg/dL (ref 17.0–34.0)

## 2013-08-15 LAB — PHOSPHORUS: PHOSPHORUS: 5.1 mg/dL — AB (ref 2.3–4.6)

## 2013-08-15 LAB — TRIGLYCERIDES: TRIGLYCERIDES: 282 mg/dL — AB (ref <150)

## 2013-08-15 LAB — MAGNESIUM: MAGNESIUM: 2.3 mg/dL (ref 1.5–2.5)

## 2013-08-15 MED ORDER — M.V.I. ADULT IV INJ
INTRAVENOUS | Status: AC
  Administered 2013-08-15: 17:00:00 via INTRAVENOUS
  Filled 2013-08-15: qty 2000

## 2013-08-15 MED ORDER — CIPROFLOXACIN IN D5W 400 MG/200ML IV SOLN
400.0000 mg | Freq: Two times a day (BID) | INTRAVENOUS | Status: DC
  Administered 2013-08-15 – 2013-08-22 (×15): 400 mg via INTRAVENOUS
  Filled 2013-08-15 (×18): qty 200

## 2013-08-15 MED ORDER — POTASSIUM CHLORIDE 10 MEQ/50ML IV SOLN
10.0000 meq | INTRAVENOUS | Status: AC
  Administered 2013-08-15 (×4): 10 meq via INTRAVENOUS
  Filled 2013-08-15 (×8): qty 50

## 2013-08-15 MED ORDER — LORAZEPAM 2 MG/ML IJ SOLN
INTRAMUSCULAR | Status: AC
  Administered 2013-08-15: 0.5 mg
  Filled 2013-08-15: qty 1

## 2013-08-15 MED ORDER — BISACODYL 10 MG RE SUPP
10.0000 mg | Freq: Every day | RECTAL | Status: DC
  Administered 2013-08-15 – 2013-09-12 (×19): 10 mg via RECTAL
  Filled 2013-08-15 (×21): qty 1

## 2013-08-15 MED ORDER — FAT EMULSION 20 % IV EMUL
240.0000 mL | INTRAVENOUS | Status: AC
  Administered 2013-08-15: 240 mL via INTRAVENOUS
  Filled 2013-08-15: qty 250

## 2013-08-15 MED ORDER — SODIUM CHLORIDE 0.45 % IV SOLN
INTRAVENOUS | Status: DC
  Administered 2013-08-16: 20 mL via INTRAVENOUS
  Administered 2013-08-20: 15:00:00 via INTRAVENOUS
  Administered 2013-08-23: 20 mL/h via INTRAVENOUS
  Administered 2013-08-25 – 2013-08-31 (×3): via INTRAVENOUS
  Administered 2013-09-02: 20 mL/h via INTRAVENOUS
  Administered 2013-09-04: 23:00:00 via INTRAVENOUS
  Administered 2013-09-06 – 2013-09-08 (×2): 20 mL/h via INTRAVENOUS

## 2013-08-15 NOTE — Progress Notes (Signed)
Patient continues to be nauseous with intermittent small amounts of green vomitus after phenergan and Zofran.  16 french NG tube inserted into left nostril with immediate return of 1200 ccs of green fluid.

## 2013-08-15 NOTE — Progress Notes (Signed)
Foley catheter removed per MD order at approximately 10 a.m., patient up to [facility] with multiple attempts to void throughout afternoon.  Patient never felt the urge to void but did attempt at nurses insistence.  Bladder scan performed with greater than 250 mls read, 14 French foley inserted with return of approximately 400 ccs of yellow urine.

## 2013-08-15 NOTE — Progress Notes (Signed)
INITIAL NUTRITION ASSESSMENT  DOCUMENTATION CODES Per approved criteria  -Non-severe (moderate) malnutrition in the context of acute illness or injury   INTERVENTION:  TPN dosing per Pharmacy to meet 100% of estimated nutrition needs as able.  May be unable to meet nutrition goals due to premixed Clinimix solution.  NUTRITION DIAGNOSIS: Malnutrition related to altered GI function as evidenced by intake </= 50% of estimated energy requirement for >/= 5 days and mild loss of muscle mass.  Goal: Intake to meet >90% of estimated nutrition needs.  Monitor:  TPN adequacy/tolerance, diet advancement/tolerance, PO intake, labs, weight trend.  Reason for Assessment: Consult for new TPN  73 y.o. female  Admitting Dx: Pancreatic Cancer; S/P pancreaticoduodenectomy and placement of biliary and pancreatic stent on 3/11.   ASSESSMENT: Patient presented to the hospital on 3/11 for diagnostic laparoscopy and Whipple procedure for pancreatic cancer. Patient was NPO or on clear liquids for the first 5 days of admission. Diet advanced to full liquids on 3/17. Patient has had a lot of nausea and vomiting, not tolerating full liquid diet. TPN was initiated 3/18.  Nutrition Focused Physical Exam:  Subcutaneous Fat:  Orbital Region: WNL Upper Arm Region: WNL Thoracic and Lumbar Region: WNL  Muscle:  Temple Region: WNL Clavicle Bone Region: mild depletion Clavicle and Acromion Bone Region: WNL Scapular Bone Region: WNL Dorsal Hand: mild depletion Patellar Region: WNL Anterior Thigh Region: WNL Posterior Calf Region: WNL  Edema: none  Pt meets criteria for non-severe (moderate) MALNUTRITION in the context of acute illness as evidenced by intake </= 50% of estimated energy requirement for >/= 5 days and mild loss of muscle mass.   Patient reports nausea and vomiting since surgery last week. She has been unable to keep down any liquids this morning. Patient reports good appetite and good  oral intake with stable weight PTA.  Patient is receiving TPN with Clinimix E 5/15 @ 30 ml/hr and lipids @ 10 ml/hr. Provides 960 ml, 991 kcal, and 36 grams protein per day. Meets 60% minimum estimated energy needs and 40% minimum estimated protein needs.   Height: Ht Readings from Last 1 Encounters:  08/10/13 5\' 2"  (1.575 m)    Weight: Wt Readings from Last 1 Encounters:  08/10/13 162 lb 11.2 oz (73.8 kg)    Ideal Body Weight: 50 kg  % Ideal Body Weight: 148%  Wt Readings from Last 10 Encounters:  08/10/13 162 lb 11.2 oz (73.8 kg)  08/10/13 162 lb 11.2 oz (73.8 kg)  08/05/13 163 lb (73.936 kg)  07/31/13 163 lb 1.6 oz (73.982 kg)  07/23/13 162 lb 4.8 oz (73.619 kg)  07/16/13 163 lb 9.6 oz (74.208 kg)  07/03/13 166 lb (75.297 kg)  07/01/13 163 lb 6.4 oz (74.118 kg)  06/20/13 165 lb (74.844 kg)  04/17/13 164 lb (74.39 kg)    Usual Body Weight: 162-165 lb  % Usual Body Weight: 100%  BMI:  Body mass index is 29.75 kg/(m^2).  Estimated Nutritional Needs: Kcal: 1650-1850 Protein: 90-110 gm Fluid: 1.7-2 L  Skin: surgical incision to abdomen  Diet Order: Full Liquid  EDUCATION NEEDS: -Education not appropriate at this time   Intake/Output Summary (Last 24 hours) at 08/15/13 0814 Last data filed at 08/15/13 0600  Gross per 24 hour  Intake 1429.5 ml  Output   1295 ml  Net  134.5 ml    Last BM: 3/16   Labs:   Recent Labs Lab 08/13/13 0712 08/14/13 0602 08/14/13 1350 08/15/13 0525  NA 141 141  --  139  K 3.5* 3.7  --  3.5*  CL 108 105  --  102  CO2 20 21  --  19  BUN 9 9  --  15  CREATININE 0.63 0.70  --  0.72  CALCIUM 8.3* 8.7  --  9.0  MG  --   --  2.3 2.3  PHOS  --   --  4.7* 5.1*  GLUCOSE 127* 130*  --  141*    CBG (last 3)   Recent Labs  08/14/13 2338 08/15/13 0412 08/15/13 0749  GLUCAP 137* 126* 137*    Scheduled Meds: . bisacodyl  10 mg Rectal Daily  . ciprofloxacin  400 mg Intravenous Q12H  . FLUoxetine  20 mg Oral Daily  .  gabapentin  200 mg Oral TID AC  . gabapentin  300 mg Oral QHS  . gatifloxacin  1 drop Both Eyes QID  . heparin  5,000 Units Subcutaneous 3 times per day  . insulin aspart  0-9 Units Subcutaneous 6 times per day  . levothyroxine  100 mcg Intravenous QAC breakfast  . metoprolol  2.5 mg Intravenous 4 times per day  . mupirocin ointment  1 application Nasal BID  . pantoprazole (PROTONIX) IV  40 mg Intravenous QHS  . polyvinyl alcohol  1 drop Both Eyes BID  . topiramate  200 mg Oral BID  . vitamin E  800 Units Oral BID    Continuous Infusions: . sodium chloride 30 mL/hr at 08/14/13 1743  . Marland KitchenTPN (CLINIMIX-E) Adult 30 mL/hr at 08/14/13 1730   And  . fat emulsion 240 mL (08/14/13 1730)    Past Medical History  Diagnosis Date  . Benign neoplasm of colon   . Osteoarthritis   . Hiatal hernia   . Diarrhea   . Back pain   . Chronic maxillary sinusitis   . Depression   . Eustachian tube dysfunction   . Tardive dyskinesia   . Mitral valve prolapse   . Osteoarthritis   . GERD (gastroesophageal reflux disease)   . Allergic rhinitis   . Glaucoma   . Hypothyroid   . Hypertension   . Heart murmur     hx. "MVP" -predental antibiotics  . Memory loss   . Cancer     Pancreatic cancer with MRI scan 06-19-13  . Urgency of urination     Past Surgical History  Procedure Laterality Date  . Knee surgery Left     x 5  . 1 baker cyst removed    . Dilation and curettage of uterus      x3  . Lumbar spine surgery      x2  . Abdominal hysterectomy    . Lumbar spine surgery      cyst  . Joint replacement      LTKA  . Blepharoplasty Bilateral     with cataract surgery  . Eye surgery Right     cataract  . Eus N/A 07/10/2013    Procedure: ESOPHAGEAL ENDOSCOPIC ULTRASOUND (EUS) RADIAL;  Surgeon: Arta Silence, MD;  Location: [facility];  Service: Endoscopy;  Laterality: N/A;  . Fine needle aspiration N/A 07/10/2013    Procedure: FINE NEEDLE ASPIRATION (FNA) LINEAR;  Surgeon: Arta Silence, MD;  Location: [facility];  Service: Endoscopy;  Laterality: N/A;  possible fna  . Back surgery      fusion  . Breast surgery      Biopsy left 2 times  . Colonoscopy w/ polypectomy    . Laparoscopy  N/A 08/07/2013    Procedure: LAPAROSCOPY DIAGNOSTIC;  Surgeon: [name], MD;  Location: [facility];  Service: General;  Laterality: N/A;  . Whipple procedure N/A 08/07/2013    Procedure: WHIPPLE PROCEDURE;  Surgeon: [name], MD;  Location: [facility];  Service: General;  Laterality: N/A;    Molli Barrows, [place], LDN, [place] Pager [phone] After Hours Pager [phone]

## 2013-08-15 NOTE — Clinical Documentation Improvement (Signed)
  Possible Clinical Conditions?       Moderate Protein Calorie Malnutrition      Protein Calorie Malnutrition      Other diagnosis ________________    Supporting Information:  Per Nutrition consult on 08/15/13, patient with "Non-severe (moderate) malnutrition in the context of acute illness or injury."   Signs & Symptoms: "Malnutrition related to altered GI function as evidenced by intake </= 50% of estimated energy requirement for >/= 5 days and mild loss of muscle mass."  Treatment:  Per recommendations of the Registered Dietitian, "TPN dosing per Pharmacy to meet 100% of estimated nutrition needs as able."   Thank You,  Posey Pronto, RN, BSN, [place] Documentation Improvement Specialist HIM department--[place] Office [phone]

## 2013-08-15 NOTE — Progress Notes (Signed)
Patient ID: [name], female   DOB: Oct 09, 1940, 73 y.o.   MRN: [id number] 8 Days Post-Op   Subjective: Got picc and TNA yesterday.       Objective: Vital signs in last 24 hours: Temp:  [98.1 F (36.7 C)-99.3 F (37.4 C)] 98.8 F (37.1 C) (03/19 0551) Pulse Rate:  [84-101] 98 (03/19 0551) Resp:  [16-19] 19 (03/19 0551) BP: (99-155)/(60-82) 147/71 mmHg (03/19 0551) SpO2:  [95 %-100 %] 95 % (03/19 0551) Last BM Date: 08/12/13  Intake/Output from previous day: 03/18 0701 - 03/19 0700 In: 1565.3 [P.O.:240; I.V.:905.3; TPN:420] Out: 1295 [Urine:1175; Drains:120] Intake/Output this shift:    PE: Gen:  Alert, NAD, pleasant Card:  RRR Pulm:  Breathing comfortably Abd: Soft, non distended mild tenderness over incisions sites,  incisions C/D/I with staples in place, drains 1&2 with serosanguinous drainage, drain sites and incisions without signs of infection infection    Lab Results:   Recent Labs  08/13/13 0712 08/15/13 0525  WBC 7.1 12.7*  HGB 10.8* 12.7  HCT 31.6* 38.0  PLT 163 224   BMET  Recent Labs  08/14/13 0602 08/15/13 0525  NA 141 139  K 3.7 3.5*  CL 105 102  CO2 21 19  GLUCOSE 130* 141*  BUN 9 15  CREATININE 0.70 0.72  CALCIUM 8.7 9.0   PT/INR No results found for this basename: LABPROT, INR,  in the last 72 hours CMP     Component Value Date/Time   NA 139 08/15/2013 0525   K 3.5* 08/15/2013 0525   CL 102 08/15/2013 0525   CO2 19 08/15/2013 0525   GLUCOSE 141* 08/15/2013 0525   BUN 15 08/15/2013 0525   CREATININE 0.72 08/15/2013 0525   CREATININE 0.81 07/01/2013 1324   CALCIUM 9.0 08/15/2013 0525   PROT 6.4 08/15/2013 0525   ALBUMIN 2.7* 08/15/2013 0525   AST 33 08/15/2013 0525   ALT 43* 08/15/2013 0525   ALKPHOS 237* 08/15/2013 0525   BILITOT 0.4 08/15/2013 0525   GFRNONAA 83* 08/15/2013 0525   GFRAA >90 08/15/2013 0525   Lipase  No results found for this basename: lipase       Studies/Results: No results  found.  Anti-infectives: Anti-infectives   Start     Dose/Rate Route Frequency Ordered Stop   08/15/13 0800  ciprofloxacin (CIPRO) IVPB 400 mg     400 mg 200 mL/hr over 60 Minutes Intravenous Every 12 hours 08/15/13 0752     08/08/13 0800  ertapenem (INVANZ) 1 g in sodium chloride 0.9 % 50 mL IVPB     1 g 100 mL/hr over 30 Minutes Intravenous Every 24 hours 08/07/13 1559 08/08/13 0913   08/07/13 0600  ertapenem (INVANZ) 1 g in sodium chloride 0.9 % 50 mL IVPB     1 g 100 mL/hr over 30 Minutes Intravenous On call to O.R. 08/06/13 1431 08/07/13 0829       Assessment/Plan POD 6 s/p diagnostic laparoscopy and whipple procedure  Plan:  SCD's, mobilization  Remove foley again today  HTN - on metoprolol   Pulmonary toilet  Work on pain control   Nausea - scop patch and prn phenergan- worse today, PICC/TNA  Hyperglycemia - SSI.   On home meds Amylase via drains pending.   Disp:  SNF vs home with son after d/c.    Full liquid diet as tolerated.   Hypokalemia - replete orally, better now.      LOS: 8 days    Kenderick Kobler 08/15/2013, 7:52 AM  Pager: [phone]

## 2013-08-15 NOTE — Progress Notes (Signed)
PARENTERAL NUTRITION CONSULT NOTE - FOLLOW UP  Pharmacy Consult for TPN Indication: intolerance to enteral feeding  Allergies  Allergen Reactions  . Celecoxib Other (See Comments)    unknown  . Glucosamine     unknown  . Metoclopramide Hcl Other (See Comments)    shaking  . Niacin Other (See Comments)    shaking  . 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 Phenazopyridine Hcl Other (See Comments)    unknown  . Sulfonamide Derivatives Nausea And Vomiting  . Trovan [Alatrofloxacin Mesylate] Other (See Comments)    Caused shaking and nervousness  . Benzocaine-Resorcinol Rash  . Sulfa Antibiotics Rash    Patient Measurements: Height: '5\' 2"'  (157.5 cm) Weight: 162 lb 11.2 oz (73.8 kg) IBW/kg (Calculated) : 50.1  Vital Signs: Temp: 98.8 F (37.1 C) (03/19 0551) Temp src: Oral (03/18 2330) BP: 147/71 mmHg (03/19 0551) Pulse Rate: 98 (03/19 0551) Intake/Output from previous day: 03/18 0701 - 03/19 0700 In: 1565.3 [P.O.:240; I.V.:905.3; TPN:420] Out: 1295 [Urine:1175; Drains:120] Intake/Output from this shift:    Labs:  Recent Labs  08/13/13 0712 08/15/13 0525  WBC 7.1 12.7*  HGB 10.8* 12.7  HCT 31.6* 38.0  PLT 163 224     Recent Labs  08/13/13 0712 08/14/13 0602 08/14/13 1350 08/15/13 0525  NA 141 141  --  139  K 3.5* 3.7  --  3.5*  CL 108 105  --  102  CO2 20 21  --  19  GLUCOSE 127* 130*  --  141*  BUN 9 9  --  15  CREATININE 0.63 0.70  --  0.72  CALCIUM 8.3* 8.7  --  9.0  MG  --   --  2.3 2.3  PHOS  --   --  4.7* 5.1*  PROT 5.6* 6.1  --  6.4  ALBUMIN 2.3* 2.6*  --  2.7*  AST 27 26  --  33  ALT 36* 38*  --  43*  ALKPHOS 194* 214*  --  237*  BILITOT 0.3 0.3  --  0.4  TRIG  --   --   --  282*   Estimated Creatinine Clearance: 58.9 ml/min (by C-G formula based on Cr of 0.72).    Recent Labs  08/14/13 2338 08/15/13 0412 08/15/13 0749  GLUCAP 137* 126* 137*   Insulin Requirements in the past 24 hours:   3 units SSI; CBGs 110-130  Current Nutrition:  - Clinimix E 5/15 at 54m/hr + lipid 20% emulsion at 163mhr-provides 36g protein and 1008kcal.  -Full liquid diet- minimal intake  Nutritional Goals:  1650-1850kCal, 90-110 grams of protein per day per RD recs 3/19  Assessment:   [age]OF with pancreatic cancer s/p Whipple procedure on 3/11. She pulled out her NGT on 3/13 and then has demonstrated ability to swallow. Was trialed on clears, but has had significant nausea not responding to scopolamine and Phenergan.   GI: difficult to place tube if patient is still vomiting as risk for aspiration high. A week out from her procedure. Lat BM charted as 3/16. Has good appetite, but is unable to keep much down. Baseline prealbumin is pending  Endo: A1C 5.2 this admission. CBGs have been decently controlled after surgery with SSI only. TSH was normal in January- she has been on IV Synthroid 10068m(home dose is 175m65m  Lytes: Na 139, K 3.5, Cl 102, CorCa 9.8, phos 5.1, mag 2.3  Renal: SCr 0.72 with est  CrCl ~42m/min. UOP 0.7108mkg/hr  Pulm: RA  Cards: hx HTN and HLD as well as mitral valve prolapse; BP elevated, HR nml. Receiving IV scheduled metoprolol- other home medications have not yet been resumed.   Hepatobil: alk phos increased to 237, ALT still slightly elevated at 43, albumin low at 2.7. Other LFTs wnl. Baseline triglycerides elevated at 282  Neuro: hx anxiety and depression on gabapentin, topiramate and Prozac.  ID: WBC increased to 12.7, Tmax 99.3. Started on IV cipro today  Best Practices: subQ heparin, IV PPI Home Medications not yet resumed: atenolol, lovastatin, hydroxyzine, calcium + D, cyanocobalamin, aspirin  TPN Access: PICC placed 3/18 TPN day#: 1 (started 3/18)  Plan:  1. KCl 1068mruns x4 for repletion 2. Increase Clinimix E 5/15 to 66m19m + lipid 20% emulsion at 10mL41m this will provide 60g protein and 1392kcal-- goal rate will be Clinimix E 5/15 at 75mL/75m  lipid 20% emulsion 10mL/h67mich will provide 90g protein and 1872kcal daily 3. Daily IV multivtamin; trace elements MWF d/t national shortage 4. [facility] M[facility] 1800 5. Continue sensitive SSI and CBGs q4h 6. PO KCl tablet was stopped with addition of TPN 7. BMET, mag and phos tomorrow morning  Shavawn Stobaugh D. Zala Degrasse, PharmD, BCPS Clinical Pharmacist Pager: [phone] 10:02 AM

## 2013-08-15 NOTE — Progress Notes (Signed)
Physical Therapy Treatment Patient Details Name: [name] MRN: [id number] DOB: [birth year] Today's Date: 08/15/2013 Time: 4098-1191 PT Time Calculation (min): 24 min  PT Assessment / Plan / Recommendation  History of Present Illness admitted with adenocarcinoma of the pancreatic head, s/p whipple procedure, stenting and diagnostic lap.   PT Comments   Patient willing to get out of bed to recliner to sit up this session. Patient limited by fatigue and nausea. Patient had NG tube placed this morning and is currently NPO. Limited mobility. Continue to recommend SNF for ongoing Physical Therapy.     Follow Up Recommendations  SNF;Supervision for mobility/OOB     Does the patient have the potential to tolerate intense rehabilitation     Barriers to Discharge        Equipment Recommendations  None recommended by PT    Recommendations for Other Services    Frequency Min 3X/week   Progress towards PT Goals Progress towards PT goals: Progressing toward goals  Plan Current plan remains appropriate    Precautions / Restrictions Precautions Precautions: Fall Precaution Comments: adominal wounds; NGtube   Pertinent Vitals/Pain no apparent distress Complaints of nausea    Mobility  Bed Mobility Overal bed mobility: Needs Assistance Sidelying to sit: +2 for physical assistance;Mod assist General bed mobility comments: Cues to log roll and use sideline to sit. Cues for postioning and A for trunk control and support Transfers Overall transfer level: Needs assistance Equipment used: Rolling walker (2 wheeled) Transfers: Sit to/from Stand Sit to Stand: Mod assist;+2 physical assistance Stand pivot transfers: Mod assist General transfer comment:  Mod lift assist of 2 with sit<stand from bed; Painful and fatigued. Cues for hand placement and postioning prior to sitting    Exercises     PT Diagnosis:    PT Problem List:   PT Treatment Interventions:     PT Goals (current goals  can now be found in the care plan section)    Visit Information  Last PT Received On: 08/15/13 Assistance Needed: +2 (for ambulation) History of Present Illness: admitted with adenocarcinoma of the pancreatic head, s/p whipple procedure, stenting and diagnostic lap.    Subjective Data      Cognition  Cognition Arousal/Alertness: Awake/alert Behavior During Therapy: WFL for tasks assessed/performed Overall Cognitive Status: Within Functional Limits for tasks assessed    Balance     End of Session PT - End of Session Activity Tolerance: Patient limited by fatigue;Patient limited by pain Patient left: with call bell/phone within reach;in chair Nurse Communication: Mobility status   GP     Jacqualyn Posey 08/15/2013, 2:43 PM 08/15/2013 Jacqualyn Posey PTA [phone] pager [phone] office

## 2013-08-16 LAB — MISCELLANEOUS TEST

## 2013-08-16 LAB — CBC
HCT: 35.3 % — ABNORMAL LOW (ref 36.0–46.0)
Hemoglobin: 11.7 g/dL — ABNORMAL LOW (ref 12.0–15.0)
MCH: 31.8 pg (ref 26.0–34.0)
MCHC: 33.1 g/dL (ref 30.0–36.0)
MCV: 95.9 fL (ref 78.0–100.0)
Platelets: 235 10*3/uL (ref 150–400)
RBC: 3.68 MIL/uL — AB (ref 3.87–5.11)
RDW: 13.6 % (ref 11.5–15.5)
WBC: 13.1 10*3/uL — ABNORMAL HIGH (ref 4.0–10.5)

## 2013-08-16 LAB — COMPREHENSIVE METABOLIC PANEL
ALT: 39 U/L — AB (ref 0–35)
AST: 26 U/L (ref 0–37)
Albumin: 2.8 g/dL — ABNORMAL LOW (ref 3.5–5.2)
Alkaline Phosphatase: 191 U/L — ABNORMAL HIGH (ref 39–117)
BUN: 21 mg/dL (ref 6–23)
CO2: 22 meq/L (ref 19–32)
Calcium: 8.7 mg/dL (ref 8.4–10.5)
Chloride: 102 mEq/L (ref 96–112)
Creatinine, Ser: 0.83 mg/dL (ref 0.50–1.10)
GFR calc non Af Amer: 68 mL/min — ABNORMAL LOW (ref >90)
GFR, EST AFRICAN AMERICAN: 79 mL/min — AB (ref >90)
Glucose, Bld: 126 mg/dL — ABNORMAL HIGH (ref 70–99)
Potassium: 3.5 mEq/L — ABNORMAL LOW (ref 3.7–5.3)
Sodium: 140 mEq/L (ref 137–147)
Total Bilirubin: 0.4 mg/dL (ref 0.3–1.2)
Total Protein: 6.2 g/dL (ref 6.0–8.3)

## 2013-08-16 LAB — GLUCOSE, CAPILLARY
GLUCOSE-CAPILLARY: 131 mg/dL — AB (ref 70–99)
GLUCOSE-CAPILLARY: 123 mg/dL — AB (ref 70–99)
Glucose-Capillary: 117 mg/dL — ABNORMAL HIGH (ref 70–99)
Glucose-Capillary: 118 mg/dL — ABNORMAL HIGH (ref 70–99)
Glucose-Capillary: 124 mg/dL — ABNORMAL HIGH (ref 70–99)
Glucose-Capillary: 86 mg/dL (ref 70–99)

## 2013-08-16 LAB — PHOSPHORUS: PHOSPHORUS: 4.6 mg/dL (ref 2.3–4.6)

## 2013-08-16 LAB — MAGNESIUM: MAGNESIUM: 2.3 mg/dL (ref 1.5–2.5)

## 2013-08-16 LAB — AMYLASE, PERITONEAL FLUID: Amylase, peritoneal fluid: <10 U/L

## 2013-08-16 MED ORDER — ACETAMINOPHEN 10 MG/ML IV SOLN
1000.0000 mg | Freq: Once | INTRAVENOUS | Status: AC
  Administered 2013-08-16: 1000 mg via INTRAVENOUS
  Filled 2013-08-16: qty 100

## 2013-08-16 MED ORDER — TRACE MINERALS CR-CU-F-FE-I-MN-MO-SE-ZN IV SOLN
INTRAVENOUS | Status: AC
  Administered 2013-08-16: 17:00:00 via INTRAVENOUS
  Filled 2013-08-16: qty 2000

## 2013-08-16 MED ORDER — FAT EMULSION 20 % IV EMUL
240.0000 mL | INTRAVENOUS | Status: AC
  Administered 2013-08-16: 240 mL via INTRAVENOUS
  Filled 2013-08-16: qty 250

## 2013-08-16 MED ORDER — POTASSIUM CHLORIDE 10 MEQ/50ML IV SOLN
10.0000 meq | INTRAVENOUS | Status: AC
  Administered 2013-08-16 (×4): 10 meq via INTRAVENOUS
  Filled 2013-08-16 (×4): qty 50

## 2013-08-16 MED ORDER — LORAZEPAM 1 MG PO TABS
1.0000 mg | ORAL_TABLET | Freq: Three times a day (TID) | ORAL | Status: DC | PRN
Start: 2013-08-16 — End: 2013-08-18
  Administered 2013-08-17 – 2013-08-18 (×2): 1 mg via ORAL
  Filled 2013-08-16 (×2): qty 1

## 2013-08-16 MED ORDER — PHENOL 1.4 % MT LIQD
1.0000 | OROMUCOSAL | Status: DC | PRN
  Administered 2013-08-21: 1 via OROMUCOSAL
  Filled 2013-08-16: qty 177

## 2013-08-16 MED ORDER — METRONIDAZOLE IN NACL 5-0.79 MG/ML-% IV SOLN
500.0000 mg | Freq: Three times a day (TID) | INTRAVENOUS | Status: DC
  Administered 2013-08-16 – 2013-08-22 (×18): 500 mg via INTRAVENOUS
  Filled 2013-08-16 (×21): qty 100

## 2013-08-16 MED ORDER — INSULIN ASPART 100 UNIT/ML ~~LOC~~ SOLN
0.0000 [IU] | Freq: Four times a day (QID) | SUBCUTANEOUS | Status: DC
  Administered 2013-08-16 – 2013-08-17 (×4): 1 [IU] via SUBCUTANEOUS
  Administered 2013-08-18: 2 [IU] via SUBCUTANEOUS
  Administered 2013-08-18 – 2013-08-21 (×11): 1 [IU] via SUBCUTANEOUS
  Administered 2013-08-22: 2 [IU] via SUBCUTANEOUS
  Administered 2013-08-22 – 2013-08-23 (×3): 1 [IU] via SUBCUTANEOUS

## 2013-08-16 NOTE — Progress Notes (Signed)
PARENTERAL NUTRITION CONSULT NOTE - FOLLOW UP  Pharmacy Consult for TPN Indication: intolerance to enteral feeding  Allergies  Allergen Reactions  . Celecoxib Other (See Comments)    unknown  . Glucosamine     unknown  . Metoclopramide Hcl Other (See Comments)    shaking  . Niacin Other (See Comments)    shaking  . 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 Phenazopyridine Hcl Other (See Comments)    unknown  . Sulfonamide Derivatives Nausea And Vomiting  . Trovan [Alatrofloxacin Mesylate] Other (See Comments)    Caused shaking and nervousness  . Benzocaine-Resorcinol Rash  . Sulfa Antibiotics Rash    Patient Measurements: Height: _0  (157.5 cm) Weight: 162 lb 11.2 oz (73.8 kg) IBW/kg (Calculated) : 50.1  Vital Signs: Temp: 98.2 F (36.8 C) (03/20 0524) Temp src: Oral (03/20 0524) BP: 124/60 mmHg (03/20 0524) Pulse Rate: 96 (03/20 0524) Intake/Output from previous day: 03/19 0701 - 03/20 0700 In: 1132.3 [P.O.:60; I.V.:237.3; TPN:835] Out: 2325 [Urine:400; Emesis/NG output:1850; Drains:75] Intake/Output from this shift:    Labs:  Recent Labs  08/15/13 0525 08/16/13 0503  WBC 12.7* 13.1*  HGB 12.7 11.7*  HCT 38.0 35.3*  PLT 224 235     Recent Labs  08/14/13 0602 08/14/13 1350 08/15/13 0525 08/16/13 0503  NA 141  --  139 140  K 3.7  --  3.5* 3.5*  CL 105  --  102 102  CO2 21  --  19 22  GLUCOSE 130*  --  141* 126*  BUN 9  --  15 21  CREATININE 0.70  --  0.72 0.83  CALCIUM 8.7  --  9.0 8.7  MG  --  2.3 2.3 2.3  PHOS  --  4.7* 5.1* 4.6  PROT 6.1  --  6.4 6.2  ALBUMIN 2.6*  --  2.7* 2.8*  AST 26  --  33 26  ALT 38*  --  43* 39*  ALKPHOS 214*  --  237* 191*  BILITOT 0.3  --  0.4 0.4  PREALBUMIN  --   --  18.0  --   TRIG  --   --  282*  --    Estimated Creatinine Clearance: 56.8 ml/min (by C-G formula based on Cr of 0.83).    Recent Labs  08/15/13 2352 08/16/13 0401 08/16/13 0747  GLUCAP 86 118*  131*   Insulin Requirements in the past 24 hours:  4 units SSI  Current Nutrition:  Clinimix E 5/15 at 78m/hr + lipid 20% emulsion at 145mhr-provides 60g protein and 1332 kcal  Nutritional Goals:  1650-1850kCal, 90-110 grams of protein per day per RD recs 3/19  Assessment:   [age] with pancreatic cancer s/p Whipple procedure on 3/11. She pulled out her NGT on 3/13 and then has demonstrated ability to swallow. Diet advance to full liquids 3/17, but withsignificant nausea/vomiting not responding to scopolamine and Phenergan.   GI: Noted difficult to place tube if patient is still vomiting as risk for aspiration high. A week out from her procedure. Last BM charted as 3/16. NGT o/p 1850 ml/24h.Baseline prealbumin 18 (wnl)  Endo: A1C 5.2 this admission. CBGs have been well controlled after surgery with SSI only. TSH was normal in January- she has been on IV Synthroid 10073m(home dose is 175m31mo).  Lytes: K 3.5 - slightly low - with no real movement post IV repletion yesterday. Other lytes wnl.  Renal: SCr stable. ?  UOP charted accurately.  Pulm: RA  Cards: hx HTN and HLD as well as mitral valve prolapse; VSS. Receiving IV scheduled metoprolol- other home CV medications have not yet been resumed.   Hepatobil: alk phos increased but trending down. ALT still slightly elevated.Other LFTs wnl. Baseline triglycerides elevated at 282.  Neuro: hx anxiety and depression on gabapentin, topiramate and Prozac.  ID: WBC increased to 13.1, Tmax 99.1. Cipro D#2.  Best Practices: subQ heparin, IV PPI  Home Medications not yet resumed: atenolol, lovastatin, hydroxyzine, calcium + D, cyanocobalamin, aspirin  TPN Access: PICC placed 3/18  TPN day#: 2 (started 3/18)  Plan:  1. KCl 70mq runs x4 for repletion 2. Increase Clinimix E 5/15 to 762mhr + lipid 20% emulsion at 1085mr which will provide 90g protein and 1758 kcal daily 3. Daily IV multivitamin; trace elements MWF d/t national  shortage 4. Change sensitive SSI and CBGs to q6h 5. CMET in a.m.  CarSherlon HandingharmD, BCPS Clinical pharmacist, pager [phone] 9:31 AM

## 2013-08-16 NOTE — Social Work (Signed)
CSW continues to follow for support and assistance with SNF at d/c- bed preference is at [facility] and we are hopeful for bed at time of d/c.  Eduard Clos, MSW, [place]

## 2013-08-16 NOTE — Progress Notes (Signed)
Patient ID: [name], female   DOB: November 03, 1940, 73 y.o.   MRN: [id number] 9 Days Post-Op   Subjective: Got ngt and foley reinserted.       Objective: Vital signs in last 24 hours: Temp:  [97.8 F (36.6 C)-99.1 F (37.3 C)] 98.2 F (36.8 C) (03/20 0524) Pulse Rate:  [87-104] 96 (03/20 0524) Resp:  [16-18] 16 (03/20 0524) BP: (120-146)/(57-72) 124/60 mmHg (03/20 0524) SpO2:  [95 %-98 %] 96 % (03/20 0524) Last BM Date: 08/12/13  Intake/Output from previous day: 03/19 0701 - 03/20 0700 In: 1132.3 [P.O.:60; I.V.:237.3; TPN:835] Out: 2325 [Urine:400; Emesis/NG output:1850; Drains:75] Intake/Output this shift:    PE: Gen:  Alert, NAD, pleasant Card:  RRR Pulm:  Breathing comfortably Abd: Soft, non distended,  incisions C/D/I with steristrips, drains 1&2 with serosanguinous drainage, drain sites and incisions without signs of infection infection    Lab Results:   Recent Labs  08/15/13 0525 08/16/13 0503  WBC 12.7* 13.1*  HGB 12.7 11.7*  HCT 38.0 35.3*  PLT 224 235   BMET  Recent Labs  08/15/13 0525 08/16/13 0503  NA 139 140  K 3.5* 3.5*  CL 102 102  CO2 19 22  GLUCOSE 141* 126*  BUN 15 21  CREATININE 0.72 0.83  CALCIUM 9.0 8.7   PT/INR No results found for this basename: LABPROT, INR,  in the last 72 hours CMP     Component Value Date/Time   NA 140 08/16/2013 0503   K 3.5* 08/16/2013 0503   CL 102 08/16/2013 0503   CO2 22 08/16/2013 0503   GLUCOSE 126* 08/16/2013 0503   BUN 21 08/16/2013 0503   CREATININE 0.83 08/16/2013 0503   CREATININE 0.81 07/01/2013 1324   CALCIUM 8.7 08/16/2013 0503   PROT 6.2 08/16/2013 0503   ALBUMIN 2.8* 08/16/2013 0503   AST 26 08/16/2013 0503   ALT 39* 08/16/2013 0503   ALKPHOS 191* 08/16/2013 0503   BILITOT 0.4 08/16/2013 0503   GFRNONAA 68* 08/16/2013 0503   GFRAA 79* 08/16/2013 0503   Lipase  No results found for this basename: lipase       Studies/Results: No results found.  Anti-infectives: Anti-infectives   Start      Dose/Rate Route Frequency Ordered Stop   08/15/13 0900  ciprofloxacin (CIPRO) IVPB 400 mg     400 mg 200 mL/hr over 60 Minutes Intravenous Every 12 hours 08/15/13 0752     08/08/13 0800  ertapenem (INVANZ) 1 g in sodium chloride 0.9 % 50 mL IVPB     1 g 100 mL/hr over 30 Minutes Intravenous Every 24 hours 08/07/13 1559 08/08/13 0913   08/07/13 0600  ertapenem (INVANZ) 1 g in sodium chloride 0.9 % 50 mL IVPB     1 g 100 mL/hr over 30 Minutes Intravenous On call to O.R. 08/06/13 1431 08/07/13 0829       Assessment/Plan POD 6 s/p diagnostic laparoscopy and whipple procedure  Plan:  SCD's, mobilization  Foley for urinary retention  HTN - on metoprolol   Pulmonary toilet  Work on pain control   Nausea - scop patch and prn phenergan- worse today, PICC/TNA  Hyperglycemia - SSI.   On home meds Amylase via drains pending.   Disp:  SNF vs home with son after d/c.     Hypokalemia - replete orally, better now.      LOS: 9 days    [name] 08/16/2013, 8:54 AM Pager: [phone]

## 2013-08-16 NOTE — Progress Notes (Signed)
UR completed.  Ixchel Duck, RN BSN MHA CCM Trauma/Neuro ICU Case Manager [phone]  

## 2013-08-16 NOTE — Progress Notes (Addendum)
Occupational Therapy Treatment Patient Details Name: [name] MRN: [id number] DOB: 03-09-1941 Today's Date: 08/16/2013 Time: 9326-7124 OT Time Calculation (min): 27 min  OT Assessment / Plan / Recommendation  History of present illness admitted with adenocarcinoma of the pancreatic head, s/p whipple procedure, stenting and diagnostic lap.   OT comments  Pt agreeable to OT ADL retraining session w/ focus on grooming while sitting. Pt reports that she bathed earlier today. Discussed ADL's and self care tasks and goals of OT w/ pt whom verbalized understanding. Pt may benefit from toileting or functional mobility transfers next  session if able to assist w/ increased activity tolerance and endurance. Cont to rec +2 assist w/ SNF.Marland Kitchen   Follow Up Recommendations  SNF;Supervision/Assistance - 24 hour    Barriers to Discharge       Equipment Recommendations  3 in 1 bedside comode    Recommendations for Other Services    Frequency Min 2X/week   Progress towards OT Goals Progress towards OT goals: Progressing toward goals  Plan Discharge plan remains appropriate    Precautions / Restrictions Precautions Precautions: Fall Precaution Comments: adominal wounds; NGtube   Pertinent Vitals/Pain No c/o, denies pain when asked this date.    ADL  Grooming: Performed;Wash/dry hands;Wash/dry face;Teeth care;Brushing hair;Set up;Supervision/safety;Denture care Where Assessed - Grooming: Supported sitting ADL Comments: Pt up in chair upon OT arrival today. Pt agreeable to OT ADL retraining session w/ focus on grooming while sitting. Pt reports that she bathed earlier today. Note, Pt w/ NG tube, & RN reports of having just uncapped it as it had been capped off earlier for transfers. Discussed ADL's and self care tasks and goals of OT w/ Pt whom verbalized understanding. Pt may benefit from toileting or functional mobility transfers next session if able to assist w/ increased activity tolerance and  endurance.   OT Diagnosis:    OT Problem List:   OT Treatment Interventions:     OT Goals(current goals can now be found in the care plan section)    Visit Information  Last OT Received On: 08/16/13 Assistance Needed: +2 History of Present Illness: admitted with adenocarcinoma of the pancreatic head, s/p whipple procedure, stenting and diagnostic lap.    Subjective Data      Prior Functioning       Cognition  Cognition Arousal/Alertness: Awake/alert Behavior During Therapy: WFL for tasks assessed/performed Overall Cognitive Status: Within Functional Limits for tasks assessed    Mobility  Bed Mobility General bed mobility comments: Pt up in chair upon arrival  Transfers General transfer comment: Pt up in chair upon OT arrival today.         Balance Balance Overall balance assessment: Needs assistance Sitting-balance support: Single extremity supported;Feet supported Sitting balance-Leahy Scale: Fair  End of Session OT - End of Session Activity Tolerance: Patient limited by fatigue Patient left: in chair;with call bell/phone within reach  [place], [place] 08/16/2013, 12:50 PM

## 2013-08-17 LAB — COMPREHENSIVE METABOLIC PANEL
ALBUMIN: 2.6 g/dL — AB (ref 3.5–5.2)
ALK PHOS: 173 U/L — AB (ref 39–117)
ALT: 40 U/L — AB (ref 0–35)
AST: 26 U/L (ref 0–37)
BUN: 25 mg/dL — ABNORMAL HIGH (ref 6–23)
CO2: 22 mEq/L (ref 19–32)
Calcium: 8.5 mg/dL (ref 8.4–10.5)
Chloride: 102 mEq/L (ref 96–112)
Creatinine, Ser: 0.77 mg/dL (ref 0.50–1.10)
GFR calc Af Amer: >90 mL/min (ref >90)
GFR calc non Af Amer: 81 mL/min — ABNORMAL LOW (ref >90)
Glucose, Bld: 118 mg/dL — ABNORMAL HIGH (ref 70–99)
POTASSIUM: 4.1 meq/L (ref 3.7–5.3)
SODIUM: 139 meq/L (ref 137–147)
Total Bilirubin: 0.3 mg/dL (ref 0.3–1.2)
Total Protein: 6 g/dL (ref 6.0–8.3)

## 2013-08-17 LAB — GLUCOSE, CAPILLARY
GLUCOSE-CAPILLARY: 114 mg/dL — AB (ref 70–99)
GLUCOSE-CAPILLARY: 117 mg/dL — AB (ref 70–99)
Glucose-Capillary: 138 mg/dL — ABNORMAL HIGH (ref 70–99)
Glucose-Capillary: 127 mg/dL — ABNORMAL HIGH (ref 70–99)

## 2013-08-17 LAB — CBC
HCT: 33.3 % — ABNORMAL LOW (ref 36.0–46.0)
Hemoglobin: 11.2 g/dL — ABNORMAL LOW (ref 12.0–15.0)
MCH: 32.5 pg (ref 26.0–34.0)
MCHC: 33.6 g/dL (ref 30.0–36.0)
MCV: 96.5 fL (ref 78.0–100.0)
Platelets: 215 10*3/uL (ref 150–400)
RBC: 3.45 MIL/uL — ABNORMAL LOW (ref 3.87–5.11)
RDW: 13.9 % (ref 11.5–15.5)
WBC: 12 10*3/uL — AB (ref 4.0–10.5)

## 2013-08-17 MED ORDER — M.V.I. ADULT IV INJ
INTRAVENOUS | Status: AC
  Administered 2013-08-17: 19:00:00 via INTRAVENOUS
  Filled 2013-08-17: qty 2000

## 2013-08-17 MED ORDER — FAT EMULSION 20 % IV EMUL
240.0000 mL | INTRAVENOUS | Status: AC
  Administered 2013-08-17: 240 mL via INTRAVENOUS
  Filled 2013-08-17: qty 250

## 2013-08-17 MED ORDER — GABAPENTIN 250 MG/5ML PO SOLN
200.0000 mg | Freq: Three times a day (TID) | ORAL | Status: DC
Start: 2013-08-17 — End: 2013-08-23
  Administered 2013-08-17 – 2013-08-23 (×16): 200 mg
  Filled 2013-08-17 (×24): qty 4

## 2013-08-17 MED ORDER — GABAPENTIN 250 MG/5ML PO SOLN
300.0000 mg | Freq: Every day | ORAL | Status: DC
  Administered 2013-08-17 – 2013-08-22 (×5): 300 mg
  Filled 2013-08-17 (×7): qty 6

## 2013-08-17 MED ORDER — POTASSIUM CHLORIDE 10 MEQ/100ML IV SOLN
10.0000 meq | INTRAVENOUS | Status: DC
  Administered 2013-08-17: 10 meq via INTRAVENOUS
  Filled 2013-08-17: qty 100

## 2013-08-17 MED ORDER — KETOROLAC TROMETHAMINE 30 MG/ML IJ SOLN
30.0000 mg | Freq: Three times a day (TID) | INTRAMUSCULAR | Status: DC | PRN
  Administered 2013-08-17 – 2013-08-18 (×4): 30 mg via INTRAVENOUS
  Filled 2013-08-17 (×4): qty 1

## 2013-08-17 NOTE — Progress Notes (Signed)
PARENTERAL NUTRITION CONSULT NOTE - FOLLOW UP  Pharmacy Consult for TPN Indication: intolerance to enteral feeding  Allergies  Allergen Reactions  . Celecoxib Other (See Comments)    unknown  . Glucosamine     unknown  . Metoclopramide Hcl Other (See Comments)    shaking  . Niacin Other (See Comments)    shaking  . 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 Phenazopyridine Hcl Other (See Comments)    unknown  . Sulfonamide Derivatives Nausea And Vomiting  . Trovan [Alatrofloxacin Mesylate] Other (See Comments)    Caused shaking and nervousness  . Benzocaine-Resorcinol Rash  . Sulfa Antibiotics Rash    Patient Measurements: Height: '5\' 2"'  (157.5 cm) Weight: 162 lb 11.2 oz (73.8 kg) IBW/kg (Calculated) : 50.1  Vital Signs: Temp: 99 F (37.2 C) (03/21 0558) Temp src: Oral (03/21 0558) BP: 138/62 mmHg (03/21 0558) Pulse Rate: 96 (03/21 0558) Intake/Output from previous day: 03/20 0701 - 03/21 0700 In: 1669.4 [I.V.:170; IV Piggyback:600; TPN:899.4] Out: 3365 [Urine:1700; Emesis/NG output:1600; Drains:65] Intake/Output from this shift:    Labs:  Recent Labs  08/15/13 0525 08/16/13 0503 08/17/13 0500  WBC 12.7* 13.1* 12.0*  HGB 12.7 11.7* 11.2*  HCT 38.0 35.3* 33.3*  PLT 224 235 215     Recent Labs  08/14/13 1350 08/15/13 0525 08/16/13 0503 08/17/13 0500  NA  --  139 140 139  K  --  3.5* 3.5* 4.1  CL  --  102 102 102  CO2  --  '19 22 22  ' GLUCOSE  --  141* 126* 118*  BUN  --  15 21 25*  CREATININE  --  0.72 0.83 0.77  CALCIUM  --  9.0 8.7 8.5  MG 2.3 2.3 2.3  --   PHOS 4.7* 5.1* 4.6  --   PROT  --  6.4 6.2 6.0  ALBUMIN  --  2.7* 2.8* 2.6*  AST  --  33 26 26  ALT  --  43* 39* 40*  ALKPHOS  --  237* 191* 173*  BILITOT  --  0.4 0.4 0.3  PREALBUMIN  --  18.0  --   --   TRIG  --  282*  --   --    Estimated Creatinine Clearance: 58.9 ml/min (by C-G formula based on Cr of 0.77).    Recent Labs  08/16/13 1750  08/16/13 2344 08/17/13 0556  GLUCAP 124* 117* 138*   Insulin Requirements in the past 24 hours:  4 units SSI  Current Nutrition:  Clinimix E 5/15 at 82m/hr + lipid 20% emulsion at 154mhr-provides 90g protein and 1758 kcal daily  Nutritional Goals:  1650-1850kCal, 90-110 grams of protein per day per RD recs 3/19  Assessment:   [age]OF with pancreatic cancer s/p Whipple procedure on 3/11. She pulled out her NGT on 3/13 and then has demonstrated ability to swallow. Diet advance to full liquids 3/17, but with significant nausea/vomiting not responding to scopolamine and Phenergan.   GI: Noted difficult to place tube if patient is still vomiting as risk for aspiration high. A week out from her procedure. Last BM this morning per Dr. ByMarlowe Aschoffote (not charted) NGT o/p 160034m4h. Baseline prealbumin 18 (wnl)  Endo: A1C 5.2 this admission. CBGs have been well controlled after surgery with SSI only. TSH was normal in January- she has been on IV Synthroid 100m73mhome dose is 175mc32m).  Lytes: K 4.1, other lytes WNL  Renal: SCr stable, CrCl ~4m/min; ?accuracy of UOP charting  Pulm: RA  Cards: hx HTN and HLD as well as mitral valve prolapse; VSS. Receiving IV scheduled metoprolol- other home CV medications have not yet been resumed.   Hepatobil: alk phos elevated but continues to trend down. ALT still slightly elevated.Other LFTs wnl. Baseline triglycerides elevated at 282.  Neuro: hx anxiety and depression on gabapentin, topiramate and Prozac.  ID: WBC 12, Tmax 99. Cipro D#3, Flagyl D#2  Best Practices: subQ heparin, IV PPI  Home Medications not yet resumed: atenolol, lovastatin, hydroxyzine, calcium + D, cyanocobalamin, aspirin  TPN Access: PICC placed 3/18  TPN day#: 3 (started 3/18)  Plan:  1. Continue Clinimix E 5/15 at 790mhr + lipid 20% emulsion at 1023mr which will provide 90g protein and 1758 kcal daily- this is providing 100% of protein and 100% of kcal goals 3.  Daily IV multivitamin; trace elements on MWF only d/t national shortage 4. Continue sensitive SSI and CBGs to q6h 5. CMET in morning per MD 6. Follow for need to cycle TPN or start enteral nutrition  Conal Shetley D. Asuzena Weis, PharmD, BCPS Clinical Pharmacist Pager: [phone] 8:47 AM

## 2013-08-17 NOTE — Progress Notes (Signed)
Patient ID: [name], female   DOB: 02-19-1941, 73 y.o.   MRN: [id number] 10 Days Post-Op   Subjective: Had BM this AM.  Still having a lot of headache and side pain.      Objective: Vital signs in last 24 hours: Temp:  [97.3 F (36.3 C)-99 F (37.2 C)] 99 F (37.2 C) (03/21 0558) Pulse Rate:  [84-96] 96 (03/21 0558) Resp:  [16-18] 16 (03/21 0558) BP: (121-138)/(53-64) 138/62 mmHg (03/21 0558) SpO2:  [95 %-98 %] 95 % (03/21 0558) Last BM Date: 08/12/13  Intake/Output from previous day: 03/20 0701 - 03/21 0700 In: 1669.4 [I.V.:170; IV Piggyback:600; TPN:899.4] Out: 2915 [Urine:1700; Emesis/NG output:1150; Drains:65] Intake/Output this shift: Total I/O In: 369.4 [TPN:369.4] Out: 1015 [Urine:700; Emesis/NG output:250; Drains:65]  PE: Gen:  Alert, NAD, pleasant Card:  RRR Pulm:  Breathing comfortably Abd: Soft, non distended,  incisions C/D/I with steristrips, drains 1&2 with serosanguinous drainage, drain sites and incisions without signs of infection infection    Lab Results:   Recent Labs  08/15/13 0525 08/16/13 0503  WBC 12.7* 13.1*  HGB 12.7 11.7*  HCT 38.0 35.3*  PLT 224 235   BMET  Recent Labs  08/15/13 0525 08/16/13 0503  NA 139 140  K 3.5* 3.5*  CL 102 102  CO2 19 22  GLUCOSE 141* 126*  BUN 15 21  CREATININE 0.72 0.83  CALCIUM 9.0 8.7   PT/INR No results found for this basename: LABPROT, INR,  in the last 72 hours CMP     Component Value Date/Time   NA 140 08/16/2013 0503   K 3.5* 08/16/2013 0503   CL 102 08/16/2013 0503   CO2 22 08/16/2013 0503   GLUCOSE 126* 08/16/2013 0503   BUN 21 08/16/2013 0503   CREATININE 0.83 08/16/2013 0503   CREATININE 0.81 07/01/2013 1324   CALCIUM 8.7 08/16/2013 0503   PROT 6.2 08/16/2013 0503   ALBUMIN 2.8* 08/16/2013 0503   AST 26 08/16/2013 0503   ALT 39* 08/16/2013 0503   ALKPHOS 191* 08/16/2013 0503   BILITOT 0.4 08/16/2013 0503   GFRNONAA 68* 08/16/2013 0503   GFRAA 79* 08/16/2013 0503   Lipase  No results  found for this basename: lipase       Studies/Results: No results found.  Anti-infectives: Anti-infectives   Start     Dose/Rate Route Frequency Ordered Stop   08/16/13 1000  metroNIDAZOLE (FLAGYL) IVPB 500 mg     500 mg 100 mL/hr over 60 Minutes Intravenous Every 8 hours 08/16/13 0854     08/15/13 0900  ciprofloxacin (CIPRO) IVPB 400 mg     400 mg 200 mL/hr over 60 Minutes Intravenous Every 12 hours 08/15/13 0752     08/08/13 0800  ertapenem (INVANZ) 1 g in sodium chloride 0.9 % 50 mL IVPB     1 g 100 mL/hr over 30 Minutes Intravenous Every 24 hours 08/07/13 1559 08/08/13 0913   08/07/13 0600  ertapenem (INVANZ) 1 g in sodium chloride 0.9 % 50 mL IVPB     1 g 100 mL/hr over 30 Minutes Intravenous On call to O.R. 08/06/13 1431 08/07/13 0829       Assessment/Plan POD 6 s/p diagnostic laparoscopy and whipple procedure  Plan:  SCD's, mobilization  Foley for urinary retention  HTN - on metoprolol   Pulmonary toilet  Work on pain control   Nausea - scop patch and prn phenergan- PICC/TNA  Hyperglycemia - SSI.   On home meds Amylase via drains low, pull drains.  Disp:  SNF vs home with son after d/c.     Hypokalemia - replete orally, better now.      LOS: 10 days    Natassja Ollis 08/17/2013, 6:56 AM Pager: [phone]

## 2013-08-17 NOTE — Progress Notes (Signed)
K 4.1, Dr. Donne Hazel notified.  Order received.

## 2013-08-18 LAB — COMPREHENSIVE METABOLIC PANEL
ALBUMIN: 2.6 g/dL — AB (ref 3.5–5.2)
ALK PHOS: 155 U/L — AB (ref 39–117)
ALT: 29 U/L (ref 0–35)
AST: 19 U/L (ref 0–37)
BILIRUBIN TOTAL: 0.3 mg/dL (ref 0.3–1.2)
BUN: 28 mg/dL — AB (ref 6–23)
CO2: 22 mEq/L (ref 19–32)
Calcium: 8.6 mg/dL (ref 8.4–10.5)
Chloride: 103 mEq/L (ref 96–112)
Creatinine, Ser: 0.76 mg/dL (ref 0.50–1.10)
GFR calc Af Amer: >90 mL/min (ref >90)
GFR calc non Af Amer: 82 mL/min — ABNORMAL LOW (ref >90)
GLUCOSE: 129 mg/dL — AB (ref 70–99)
POTASSIUM: 4.3 meq/L (ref 3.7–5.3)
Sodium: 140 mEq/L (ref 137–147)
Total Protein: 5.9 g/dL — ABNORMAL LOW (ref 6.0–8.3)

## 2013-08-18 LAB — GLUCOSE, CAPILLARY
GLUCOSE-CAPILLARY: 135 mg/dL — AB (ref 70–99)
Glucose-Capillary: 164 mg/dL — ABNORMAL HIGH (ref 70–99)
Glucose-Capillary: 99 mg/dL (ref 70–99)

## 2013-08-18 LAB — CBC
HEMATOCRIT: 33.7 % — AB (ref 36.0–46.0)
HEMOGLOBIN: 11.3 g/dL — AB (ref 12.0–15.0)
MCH: 32.1 pg (ref 26.0–34.0)
MCHC: 33.5 g/dL (ref 30.0–36.0)
MCV: 95.7 fL (ref 78.0–100.0)
Platelets: 199 10*3/uL (ref 150–400)
RBC: 3.52 MIL/uL — ABNORMAL LOW (ref 3.87–5.11)
RDW: 13.7 % (ref 11.5–15.5)
WBC: 11 10*3/uL — AB (ref 4.0–10.5)

## 2013-08-18 MED ORDER — FAT EMULSION 20 % IV EMUL
240.0000 mL | INTRAVENOUS | Status: AC
  Administered 2013-08-18: 240 mL via INTRAVENOUS
  Filled 2013-08-18: qty 250

## 2013-08-18 MED ORDER — FLUOXETINE HCL 20 MG/5ML PO SOLN
20.0000 mg | Freq: Every day | ORAL | Status: DC
  Administered 2013-08-19 – 2013-09-12 (×23): 20 mg via ORAL
  Filled 2013-08-18 (×26): qty 5

## 2013-08-18 MED ORDER — M.V.I. ADULT IV INJ
INTRAVENOUS | Status: AC
  Administered 2013-08-18: 19:00:00 via INTRAVENOUS
  Filled 2013-08-18: qty 2000

## 2013-08-18 NOTE — Progress Notes (Signed)
Patient ID: [name], female   DOB: Jan 29, 1941, 73 y.o.   MRN: [id number] 11 Days Post-Op   Subjective: Continues to have brown BMs, but still denies flatus.  Continues to have high NGT output.  Burning pain in side better with drains out.    Objective: Vital signs in last 24 hours: Temp:  [98 F (36.7 C)-98.8 F (37.1 C)] 98 F (36.7 C) (03/22 0514) Pulse Rate:  [92-101] 101 (03/22 0514) Resp:  [17-18] 18 (03/22 0514) BP: (119-128)/(57-60) 120/57 mmHg (03/22 0514) SpO2:  [97 %-98 %] 97 % (03/22 0514) Last BM Date: 08/18/13  Intake/Output from previous day: 03/21 0701 - 03/22 0700 In: 4297.9 [P.O.:50; I.V.:785; NG/GT:50; IV Piggyback:700; TPN:2712.9] Out: 3400 [Urine:1600; Emesis/NG output:1800] Intake/Output this shift:    PE: Gen:  Alert, NAD, pleasant.  Looks better than yesterday.   Card:  RRR Pulm:  Breathing comfortably Abd: Soft, non distended,  incisions C/D/I with steristrips, drains out.      Lab Results:   Recent Labs  08/17/13 0500 08/18/13 0500  WBC 12.0* 11.0*  HGB 11.2* 11.3*  HCT 33.3* 33.7*  PLT 215 199   BMET  Recent Labs  08/17/13 0500 08/18/13 0500  NA 139 140  K 4.1 4.3  CL 102 103  CO2 22 22  GLUCOSE 118* 129*  BUN 25* 28*  CREATININE 0.77 0.76  CALCIUM 8.5 8.6   PT/INR No results found for this basename: LABPROT, INR,  in the last 72 hours CMP     Component Value Date/Time   NA 140 08/18/2013 0500   K 4.3 08/18/2013 0500   CL 103 08/18/2013 0500   CO2 22 08/18/2013 0500   GLUCOSE 129* 08/18/2013 0500   BUN 28* 08/18/2013 0500   CREATININE 0.76 08/18/2013 0500   CREATININE 0.81 07/01/2013 1324   CALCIUM 8.6 08/18/2013 0500   PROT 5.9* 08/18/2013 0500   ALBUMIN 2.6* 08/18/2013 0500   AST 19 08/18/2013 0500   ALT 29 08/18/2013 0500   ALKPHOS 155* 08/18/2013 0500   BILITOT 0.3 08/18/2013 0500   GFRNONAA 82* 08/18/2013 0500   GFRAA >90 08/18/2013 0500   Lipase  No results found for this basename: lipase        Studies/Results: No results found.  Anti-infectives: Anti-infectives   Start     Dose/Rate Route Frequency Ordered Stop   08/16/13 1000  metroNIDAZOLE (FLAGYL) IVPB 500 mg     500 mg 100 mL/hr over 60 Minutes Intravenous Every 8 hours 08/16/13 0854     08/15/13 0900  ciprofloxacin (CIPRO) IVPB 400 mg     400 mg 200 mL/hr over 60 Minutes Intravenous Every 12 hours 08/15/13 0752     08/08/13 0800  ertapenem (INVANZ) 1 g in sodium chloride 0.9 % 50 mL IVPB     1 g 100 mL/hr over 30 Minutes Intravenous Every 24 hours 08/07/13 1559 08/08/13 0913   08/07/13 0600  ertapenem (INVANZ) 1 g in sodium chloride 0.9 % 50 mL IVPB     1 g 100 mL/hr over 30 Minutes Intravenous On call to O.R. 08/06/13 1431 08/07/13 0829       Assessment/Plan POD 6 s/p diagnostic laparoscopy and whipple procedure  Plan:  SCD's, mobilization  Foley for urinary retention  HTN - on metoprolol   Pulmonary toilet  Delayed gastric emptying.  NGT/NPO/TNA Protein calorie malnutrition - moderate - continue TNA.    Nausea - scop patch and prn phenergan- PICC/TNA  Hyperglycemia - SSI.   On  home meds Amylase via drains low, pull drains.   Disp:  SNF vs home with son after d/c.     Hypokalemia - improved.     LOS: 11 days    [name] 08/18/2013, 11:46 AM Pager: [phone]

## 2013-08-18 NOTE — Progress Notes (Signed)
PARENTERAL NUTRITION CONSULT NOTE - FOLLOW UP  Pharmacy Consult for TPN Indication: intolerance to enteral feeding  Allergies  Allergen Reactions  . Celecoxib Other (See Comments)    unknown  . Glucosamine     unknown  . Metoclopramide Hcl Other (See Comments)    shaking  . Niacin Other (See Comments)    shaking  . 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 Phenazopyridine Hcl Other (See Comments)    unknown  . Sulfonamide Derivatives Nausea And Vomiting  . Trovan [Alatrofloxacin Mesylate] Other (See Comments)    Caused shaking and nervousness  . Benzocaine-Resorcinol Rash  . Sulfa Antibiotics Rash    Patient Measurements: Height: _0  (157.5 cm) Weight: 162 lb 11.2 oz (73.8 kg) IBW/kg (Calculated) : 50.1  Vital Signs: Temp: 98 F (36.7 C) (03/22 0514) Temp src: Oral (03/22 0514) BP: 120/57 mmHg (03/22 0514) Pulse Rate: 101 (03/22 0514) Intake/Output from previous day: 03/21 0701 - 03/22 0700 In: 4297.9 [P.O.:50; I.V.:785; NG/GT:50; IV Piggyback:700; TPN:2712.9] Out: 3400 [Urine:1600; Emesis/NG output:1800] Intake/Output from this shift:    Labs:  Recent Labs  08/16/13 0503 08/17/13 0500 08/18/13 0500  WBC 13.1* 12.0* 11.0*  HGB 11.7* 11.2* 11.3*  HCT 35.3* 33.3* 33.7*  PLT 235 215 199     Recent Labs  08/16/13 0503 08/17/13 0500 08/18/13 0500  NA 140 139 140  K 3.5* 4.1 4.3  CL 102 102 103  CO2 _1 GLUCOSE 126* 118* 129*  BUN 21 25* 28*  CREATININE 0.83 0.77 0.76  CALCIUM 8.7 8.5 8.6  MG 2.3  --   --   PHOS 4.6  --   --   PROT 6.2 6.0 5.9*  ALBUMIN 2.8* 2.6* 2.6*  AST _2 ALT 39* 40* 29  ALKPHOS 191* 173* 155*  BILITOT 0.4 0.3 0.3   Estimated Creatinine Clearance: 58.9 ml/min (by C-G formula based on Cr of 0.76).    Recent Labs  08/17/13 1759 08/17/13 2333 08/18/13 0544  GLUCAP 127* 117* 135*   Insulin Requirements in the past 24 hours:  2 units SSI  Current Nutrition:   Clinimix E 5/15 at 49m/hr + lipid 20% emulsion at 134mhr-provides 90g protein and 1758 kcal daily  Nutritional Goals:  1650-1850kCal, 90-110 grams of protein per day per RD recs 3/19  Assessment:   [age]OF with pancreatic cancer s/p Whipple procedure on 3/11. She pulled out her NGT on 3/13 and then has demonstrated ability to swallow. Diet advance to full liquids 3/17, but with significant nausea/vomiting not responding to scopolamine and Phenergan.   GI: Noted difficult to place tube if patient is still vomiting as risk for aspiration high. A week out from her procedure. Last BM 3/21. NGT o/p 18003m4h. Baseline prealbumin 18 (wnl)  Endo: A1C 5.2 this admission. CBGs have been well controlled after surgery with SSI only- 114-138 in the last 24 hours. TSH was normal in January- she has been on IV Synthroid 100m56mhome dose is 175mc11m).  Lytes: Na 140, K 4.3, CorCa 9.7, Phos 4.6, (Ca x phos product = 45) Mag 2.3  Renal: SCr stable, CrCl ~60mL/17m UOP 0.9mL/kg66m  Pulm: RA  Cards: hx HTN and HLD as well as mitral valve prolapse; VSS. Receiving IV scheduled metoprolol- other home CV medications have not yet been resumed.   Hepatobil: alk phos elevated but continues to trend down. ALT now WNL.Other LFTs wnl. Baseline triglycerides elevated at  282  Neuro: hx anxiety and depression on gabapentin, topiramate and Prozac.  ID: WBC 11, afebrile.. Cipro D#4, Flagyl D#3. No cultures  Best Practices: subQ heparin, IV PPI  Home Medications not yet resumed: atenolol, lovastatin, hydroxyzine, calcium + D, cyanocobalamin, aspirin  TPN Access: PICC placed 3/18  TPN day#: 4 (started 3/18)  Plan:  1. Continue Clinimix E 5/15 at 69m/hr + lipid 20% emulsion at 145mhr which will provide 90g protein and 1758 kcal daily- this is providing 100% of protein and 100% of kcal goals 3. Daily IV multivitamin; trace elements on MWF only d/t national shortage 4. Continue sensitive SSI and CBGs q6h 5.  [facility]. TPN labs as ordered 7. Follow for need to cycle TPN or start enteral nutrition  Verdia Bolt D. Chelsei Mcchesney, PharmD, BCPS Clinical Pharmacist Pager: [phone]/22/2015 8:38 AM

## 2013-08-19 ENCOUNTER — Ambulatory Visit: Payer: Medicare Other

## 2013-08-19 ENCOUNTER — Telehealth: Payer: Self-pay | Admitting: *Deleted

## 2013-08-19 ENCOUNTER — Encounter: Payer: Self-pay | Admitting: *Deleted

## 2013-08-19 ENCOUNTER — Ambulatory Visit: Payer: Medicare Other | Admitting: Radiation Oncology

## 2013-08-19 LAB — DIFFERENTIAL
Basophils Absolute: 0.1 10*3/uL (ref 0.0–0.1)
Basophils Relative: 1 % (ref 0–1)
EOS ABS: 0.4 10*3/uL (ref 0.0–0.7)
Eosinophils Relative: 4 % (ref 0–5)
Lymphocytes Relative: 16 % (ref 12–46)
Lymphs Abs: 1.7 10*3/uL (ref 0.7–4.0)
MONO ABS: 1.1 10*3/uL — AB (ref 0.1–1.0)
MONOS PCT: 10 % (ref 3–12)
Neutro Abs: 7.5 10*3/uL (ref 1.7–7.7)
Neutrophils Relative %: 70 % (ref 43–77)

## 2013-08-19 LAB — COMPREHENSIVE METABOLIC PANEL
ALK PHOS: 144 U/L — AB (ref 39–117)
ALT: 24 U/L (ref 0–35)
AST: 18 U/L (ref 0–37)
Albumin: 2.8 g/dL — ABNORMAL LOW (ref 3.5–5.2)
BILIRUBIN TOTAL: 0.3 mg/dL (ref 0.3–1.2)
BUN: 26 mg/dL — ABNORMAL HIGH (ref 6–23)
CHLORIDE: 105 meq/L (ref 96–112)
CO2: 23 meq/L (ref 19–32)
Calcium: 8.5 mg/dL (ref 8.4–10.5)
Creatinine, Ser: 0.74 mg/dL (ref 0.50–1.10)
GFR, EST NON AFRICAN AMERICAN: 82 mL/min — AB (ref >90)
GLUCOSE: 121 mg/dL — AB (ref 70–99)
POTASSIUM: 4.2 meq/L (ref 3.7–5.3)
SODIUM: 141 meq/L (ref 137–147)
Total Protein: 6 g/dL (ref 6.0–8.3)

## 2013-08-19 LAB — PHOSPHORUS: Phosphorus: 4.3 mg/dL (ref 2.3–4.6)

## 2013-08-19 LAB — GLUCOSE, CAPILLARY
GLUCOSE-CAPILLARY: 124 mg/dL — AB (ref 70–99)
GLUCOSE-CAPILLARY: 124 mg/dL — AB (ref 70–99)
Glucose-Capillary: 137 mg/dL — ABNORMAL HIGH (ref 70–99)
Glucose-Capillary: 119 mg/dL — ABNORMAL HIGH (ref 70–99)

## 2013-08-19 LAB — MAGNESIUM: Magnesium: 2.3 mg/dL (ref 1.5–2.5)

## 2013-08-19 LAB — CBC
HCT: 34.3 % — ABNORMAL LOW (ref 36.0–46.0)
Hemoglobin: 11.4 g/dL — ABNORMAL LOW (ref 12.0–15.0)
MCH: 31.9 pg (ref 26.0–34.0)
MCHC: 33.2 g/dL (ref 30.0–36.0)
MCV: 96.1 fL (ref 78.0–100.0)
Platelets: 221 10*3/uL (ref 150–400)
RBC: 3.57 MIL/uL — AB (ref 3.87–5.11)
RDW: 13.7 % (ref 11.5–15.5)
WBC: 10.8 10*3/uL — ABNORMAL HIGH (ref 4.0–10.5)

## 2013-08-19 LAB — TRIGLYCERIDES: TRIGLYCERIDES: 223 mg/dL — AB (ref <150)

## 2013-08-19 LAB — AMYLASE, PERITONEAL FLUID

## 2013-08-19 MED ORDER — CHLORHEXIDINE GLUCONATE 0.12 % MT SOLN
15.0000 mL | Freq: Two times a day (BID) | OROMUCOSAL | Status: DC
  Administered 2013-08-19 – 2013-08-24 (×12): 15 mL via OROMUCOSAL
  Filled 2013-08-19 (×9): qty 15

## 2013-08-19 MED ORDER — FAT EMULSION 20 % IV EMUL
240.0000 mL | INTRAVENOUS | Status: AC
  Administered 2013-08-19: 240 mL via INTRAVENOUS
  Filled 2013-08-19: qty 250

## 2013-08-19 MED ORDER — TRACE MINERALS CR-CU-F-FE-I-MN-MO-SE-ZN IV SOLN
INTRAVENOUS | Status: AC
  Administered 2013-08-19: 17:00:00 via INTRAVENOUS
  Filled 2013-08-19: qty 2000

## 2013-08-19 MED ORDER — BIOTENE DRY MOUTH MT LIQD
15.0000 mL | Freq: Two times a day (BID) | OROMUCOSAL | Status: DC
  Administered 2013-08-20 – 2013-08-25 (×11): 15 mL via OROMUCOSAL

## 2013-08-19 MED ORDER — ACETAMINOPHEN 10 MG/ML IV SOLN
1000.0000 mg | Freq: Once | INTRAVENOUS | Status: AC
  Administered 2013-08-19: 1000 mg via INTRAVENOUS
  Filled 2013-08-19: qty 100

## 2013-08-19 NOTE — Telephone Encounter (Signed)
error 
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
## 2013-08-19 NOTE — Telephone Encounter (Signed)
Called patient home,  Asked if patient was  going to make her appt today with Dr.Moody, spoke with [name], son, "patient is still in the hospital, [name], [place], "she had surgery 08/07/13,  still in the hospital still having a lot of nausea", I jus got back in town, I thought they would've called and let you guys know, also she had  appt With Dr.Sherrill last week also ,could you let him know she is still in the hospital", will inform Dr.moody and Dr.Sherrill  Of this, ,son thanked [place] for the call though  1:55 PM

## 2013-08-19 NOTE — Progress Notes (Signed)
Physical Therapy Treatment Patient Details Name: [name] MRN: [id number] DOB: 06-27-1940 Today's Date: 08/19/2013 Time: 8250-5397 PT Time Calculation (min): 21 min  PT Assessment / Plan / Recommendation  History of Present Illness admitted with adenocarcinoma of the pancreatic head, s/p whipple procedure, stenting and diagnostic lap.   PT Comments   Patient progressing with ambulation this session. Encourage OOB as much as tolerated. Continue to recommend SNF for ongoing Physical Therapy.     Follow Up Recommendations  SNF;Supervision for mobility/OOB     Does the patient have the potential to tolerate intense rehabilitation     Barriers to Discharge        Equipment Recommendations  None recommended by PT    Recommendations for Other Services    Frequency Min 3X/week   Progress towards PT Goals Progress towards PT goals: Progressing toward goals  Plan Current plan remains appropriate    Precautions / Restrictions Precautions Precautions: Fall Precaution Comments: adominal wounds; NGtube   Pertinent Vitals/Pain Complained of headache. RN aware    Mobility  Bed Mobility General bed mobility comments: Pt up in chair upon arrival  Transfers Overall transfer level: Needs assistance Equipment used: Rolling walker (2 wheeled) Transfers: Sit to/from Stand Sit to Stand: Min assist General transfer comment: Cues for safe hand placement and A to ensure balance and stability. Stood x2 Ambulation/Gait Ambulation/Gait assistance: Museum/gallery curator (Feet): 50 Feet (x2) Assistive device: Rolling walker (2 wheeled) Gait Pattern/deviations: Step-through pattern;Decreased stride length Gait velocity: decr General Gait Details: Cues for posture and to keep RW close for maximal support; min assist for maneuvering RW. Patient able to ambulate with one sitting rest break.     Exercises     PT Diagnosis:    PT Problem List:   PT Treatment Interventions:     PT  Goals (current goals can now be found in the care plan section)    Visit Information  Last PT Received On: 08/19/13 Assistance Needed: +1 History of Present Illness: admitted with adenocarcinoma of the pancreatic head, s/p whipple procedure, stenting and diagnostic lap.    Subjective Data      Cognition  Cognition Arousal/Alertness: Awake/alert Behavior During Therapy: WFL for tasks assessed/performed Overall Cognitive Status: Within Functional Limits for tasks assessed    Balance     End of Session PT - End of Session Activity Tolerance: Patient tolerated treatment well Patient left: in chair;with call bell/phone within reach;with family/visitor present Nurse Communication: Mobility status   GP     Robinette, Tonia Brooms 08/19/2013, 1:40 PM 08/19/2013 Jacqualyn Posey PTA [phone] pager [phone] office

## 2013-08-19 NOTE — Progress Notes (Signed)
Patient ID: [name], female   DOB: [birth year], 73 y.o.   MRN: [id number] 12 Days Post-Op   Subjective: NGT under 1 L yesterday! Continues to complain of throat pain from tube.  Continues to have BMs  Objective: Vital signs in last 24 hours: Temp:  [98.2 F (36.8 C)-98.9 F (37.2 C)] 98.2 F (36.8 C) (03/23 0545) Pulse Rate:  [86-98] 86 (03/23 0545) Resp:  [16-20] 16 (03/23 0545) BP: (128-131)/(49-57) 131/54 mmHg (03/23 0545) SpO2:  [95 %-98 %] 95 % (03/23 0545) Last BM Date: 08/18/13  Intake/Output from previous day: 03/22 0701 - 03/23 0700 In: 3253.3 [I.V.:475; IV Piggyback:700; TPN:2078.3] Out: 3551 [Urine:2050; Emesis/NG output:1500; Stool:1] Intake/Output this shift:    PE: Gen:  Alert, NAD, pleasant.  Looks better than yesterday.   Card:  RRR Pulm:  Breathing comfortably Abd: Soft, non distended,  incisions C/D/I with steristrips, drains out.      Lab Results:   Recent Labs  08/18/13 0500 08/19/13 0500  WBC 11.0* 10.8*  HGB 11.3* 11.4*  HCT 33.7* 34.3*  PLT 199 221   BMET  Recent Labs  08/18/13 0500 08/19/13 0500  NA 140 141  K 4.3 4.2  CL 103 105  CO2 22 23  GLUCOSE 129* 121*  BUN 28* 26*  CREATININE 0.76 0.74  CALCIUM 8.6 8.5   PT/INR No results found for this basename: LABPROT, INR,  in the last 72 hours CMP     Component Value Date/Time   NA 141 08/19/2013 0500   K 4.2 08/19/2013 0500   CL 105 08/19/2013 0500   CO2 23 08/19/2013 0500   GLUCOSE 121* 08/19/2013 0500   BUN 26* 08/19/2013 0500   CREATININE 0.74 08/19/2013 0500   CREATININE 0.81 07/01/2013 1324   CALCIUM 8.5 08/19/2013 0500   PROT 6.0 08/19/2013 0500   ALBUMIN 2.8* 08/19/2013 0500   AST 18 08/19/2013 0500   ALT 24 08/19/2013 0500   ALKPHOS 144* 08/19/2013 0500   BILITOT 0.3 08/19/2013 0500   GFRNONAA 82* 08/19/2013 0500   GFRAA >90 08/19/2013 0500   Lipase  No results found for this basename: lipase       Studies/Results: No results  found.  Anti-infectives: Anti-infectives   Start     Dose/Rate Route Frequency Ordered Stop   08/16/13 1000  metroNIDAZOLE (FLAGYL) IVPB 500 mg     500 mg 100 mL/hr over 60 Minutes Intravenous Every 8 hours 08/16/13 0854     08/15/13 0900  ciprofloxacin (CIPRO) IVPB 400 mg     400 mg 200 mL/hr over 60 Minutes Intravenous Every 12 hours 08/15/13 0752     08/08/13 0800  ertapenem (INVANZ) 1 g in sodium chloride 0.9 % 50 mL IVPB     1 g 100 mL/hr over 30 Minutes Intravenous Every 24 hours 08/07/13 1559 08/08/13 0913   08/07/13 0600  ertapenem (INVANZ) 1 g in sodium chloride 0.9 % 50 mL IVPB     1 g 100 mL/hr over 30 Minutes Intravenous On call to O.R. 08/06/13 1431 08/07/13 0829       Assessment/Plan POD 12 s/p diagnostic laparoscopy and whipple procedure for pancreatic adenocarcinoma  Plan:  SCD's, mobilization  Foley for urinary retention, will try again in next few days.    HTN - on metoprolol   Pulmonary toilet  Delayed gastric emptying.  NGT/NPO/TNA Protein calorie malnutrition - moderate - continue TNA.    Nausea - scop patch and prn phenergan- PICC/TNA  Hyperglycemia - SSI.  On home meds   Disp:  SNF vs home with son after d/c.     Hypokalemia - improved.     LOS: 12 days    Mccauley Diehl 08/19/2013, 8:11 AM Pager: [phone]

## 2013-08-19 NOTE — Progress Notes (Signed)
NUTRITION FOLLOW UP  Intervention:    TPN per Pharmacy  Diet advancement per MD  RD to follow nutrition Care Plan for cyclic TPN, TF initiation, and PO diet advancement  Nutrition Dx:   Malnutrition related to altered GI function as evidenced by intake </= 50% of estimated energy requirement for >/= 5 days and mild loss of muscle mass, ongoing-TPN now meeting estimated energy needs  Goal:   Intake to meet >90% of estimated nutrition needs, met  Monitor:   TPN adequacy/tolerance, diet advancement/tolerance, PO intake, labs, weight trend.  Assessment:   Patient presented to the hospital on 3/11 for diagnostic laparoscopy and Whipple procedure for pancreatic cancer.  Patient was NPO or on clear liquids for the first 5 days of admission. Diet advanced to full liquids on 3/17. Patient has had a lot of nausea and vomiting, not tolerating full liquid diet. TPN was initiated 3/18.  Patient reports nausea and vomiting since surgery last week. She has been unable to keep down any liquids this morning. Patient reports good appetite and good oral intake with stable weight PTA.   Patient is receiving TPN with Clinimix E 5/15 @ 75 ml/hr and lipids @ 10 ml/hr. Provides 1758 kcal, and 90 grams protein per day. Meets 107% minimum estimated energy needs and 100% minimum estimated protein needs.  Patient stated that she has not felt well the past 2 days. Patient is currently only taking in water PO. Patient stated that she did not currently have any nausea. Per MD note, NGT output was less than 1 L 3/22.   Height: Ht Readings from Last 1 Encounters:  08/10/13 _0  (1.575 m)    Weight Status:   Wt Readings from Last 1 Encounters:  08/10/13 162 lb 11.2 oz (73.8 kg)    Re-estimated needs:  Kcal: 1650-1850  Protein: 90-110 gm  Fluid: 1.7-2 L  Skin: abdominal incision  Diet Order:  NPO   Intake/Output Summary (Last 24 hours) at 08/19/13 1605 Last data filed at 08/19/13 1348  Gross per 24  hour  Intake 2671.58 ml  Output   3150 ml  Net -478.42 ml    Last BM: 3/22   Labs:   Recent Labs Lab 08/15/13 0525 08/16/13 0503 08/17/13 0500 08/18/13 0500 08/19/13 0500  NA 139 140 139 140 141  K 3.5* 3.5* 4.1 4.3 4.2  CL 102 102 102 103 105  CO2 _1 BUN 15 21 25* 28* 26*  CREATININE 0.72 0.83 0.77 0.76 0.74  CALCIUM 9.0 8.7 8.5 8.6 8.5  MG 2.3 2.3  --   --  2.3  PHOS 5.1* 4.6  --   --  4.3  GLUCOSE 141* 126* 118* 129* 121*    CBG (last 3)   Recent Labs  08/19/13 0002 08/19/13 0540 08/19/13 1156  GLUCAP 124* 124* 137*    Scheduled Meds: . antiseptic oral rinse  15 mL Mouth Rinse q12n4p  . bisacodyl  10 mg Rectal Daily  . chlorhexidine  15 mL Mouth Rinse BID  . ciprofloxacin  400 mg Intravenous Q12H  . FLUoxetine  20 mg Oral Daily  . gabapentin  200 mg Per Tube TID AC  . gabapentin  300 mg Per Tube QHS  . gatifloxacin  1 drop Both Eyes QID  . heparin  5,000 Units Subcutaneous 3 times per day  . insulin aspart  0-9 Units Subcutaneous 4 times per day  . levothyroxine  100 mcg Intravenous QAC breakfast  .  metoprolol  2.5 mg Intravenous 4 times per day  . metronidazole  500 mg Intravenous Q8H  . pantoprazole (PROTONIX) IV  40 mg Intravenous QHS  . polyvinyl alcohol  1 drop Both Eyes BID  . topiramate  200 mg Oral BID  . vitamin E  800 Units Oral BID    Continuous Infusions: . sodium chloride 20 mL (08/16/13 0615)  . Marland KitchenTPN (CLINIMIX-E) Adult 75 mL/hr at 08/18/13 1832   And  . fat emulsion 240 mL (08/18/13 1832)  . Marland KitchenTPN (CLINIMIX-E) Adult     And  . fat emulsion      Claudell Kyle, Dietetic Intern Pager: [phone]  I agree with student dietitian note; appropriate revisions have been made.  Molli Barrows, RD, LDN, [place] Pager# [phone] After Hours Pager# [phone]

## 2013-08-19 NOTE — Progress Notes (Signed)
PARENTERAL NUTRITION CONSULT NOTE - FOLLOW UP  Pharmacy Consult for TPN Indication: intolerance to enteral feeding  Allergies  Allergen Reactions  . Celecoxib Other (See Comments)    unknown  . Glucosamine     unknown  . Metoclopramide Hcl Other (See Comments)    shaking  . Niacin Other (See Comments)    shaking  . 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 Phenazopyridine Hcl Other (See Comments)    unknown  . Sulfonamide Derivatives Nausea And Vomiting  . Trovan [Alatrofloxacin Mesylate] Other (See Comments)    Caused shaking and nervousness  . Benzocaine-Resorcinol Rash  . Sulfa Antibiotics Rash    Patient Measurements: Height: '5\' 2"'  (157.5 cm) Weight: 162 lb 11.2 oz (73.8 kg) IBW/kg (Calculated) : 50.1  Vital Signs: Temp: 98.2 F (36.8 C) (03/23 0545) Temp src: Oral (03/23 0545) BP: 131/54 mmHg (03/23 0545) Pulse Rate: 86 (03/23 0545) Intake/Output from previous day: 03/22 0701 - 03/23 0700 In: 3253.3 [I.V.:475; IV Piggyback:700; TPN:2078.3] Out: 3551 [Urine:2050; Emesis/NG output:1500; Stool:1] Intake/Output from this shift:    Labs:  Recent Labs  08/17/13 0500 08/18/13 0500 08/19/13 0500  WBC 12.0* 11.0* 10.8*  HGB 11.2* 11.3* 11.4*  HCT 33.3* 33.7* 34.3*  PLT 215 199 221     Recent Labs  08/17/13 0500 08/18/13 0500 08/19/13 0500  NA 139 140 141  K 4.1 4.3 4.2  CL 102 103 105  CO2 '22 22 23  ' GLUCOSE 118* 129* 121*  BUN 25* 28* 26*  CREATININE 0.77 0.76 0.74  CALCIUM 8.5 8.6 8.5  MG  --   --  2.3  PHOS  --   --  4.3  PROT 6.0 5.9* 6.0  ALBUMIN 2.6* 2.6* 2.8*  AST '26 19 18  ' ALT 40* 29 24  ALKPHOS 173* 155* 144*  BILITOT 0.3 0.3 0.3  TRIG  --   --  223*   Estimated Creatinine Clearance: 58.9 ml/min (by C-G formula based on Cr of 0.74).    Recent Labs  08/18/13 1730 08/19/13 0002 08/19/13 0540  GLUCAP 99 124* 124*   Insulin Requirements in the past 24 hours:  4 units SSI  Current  Nutrition:  Clinimix E 5/15 at 58m/hr + lipid 20% emulsion at 189mhr-provides 90g protein and 1758 kcal daily  Nutritional Goals:  1650-1850kCal, 90-110 grams of protein per day per RD recs 3/19  Assessment:   89OF with pancreatic cancer s/p Whipple procedure on 3/11. She pulled out her NGT on 3/13 and then has demonstrated ability to swallow. Diet advance to full liquids 3/17, but with significant nausea/vomiting not responding to scopolamine and Phenergan.   GI: Noted difficult to place tube if patient is still vomiting as risk for aspiration high. Having regular BMs. NGT o/p 150054m4h. Baseline prealbumin 18 (wnl)- repeat pending  Endo: A1C 5.2 this admission. CBGs have been well controlled after surgery with SSI only- 99-164 in the last 24 hours. TSH was normal in January- she has been on IV Synthroid 100m20mhome dose is 175mc78m).  Lytes: Na 141, K 4.2, CorCa 9.7, Phos 4.3, (Ca x phos product = 41) Mag 2.3  Renal: SCr stable, CrCl ~60mL/33m UOP 1.2mL/kg65m  Pulm: RA  Cards: hx HTN and HLD as well as mitral valve prolapse; VSS. Receiving IV scheduled metoprolol- other home CV medications have not yet been resumed.   Hepatobil: alk phos elevated but continues to trend down, albumin increased to 2.8, bit  still low. Other LFTs wnl. Baseline triglycerides elevated at 282- now have decreased to 223  Neuro: hx anxiety and depression on gabapentin, topiramate and Prozac.  ID: WBC 10.8, afebrile. Cipro D#5, Flagyl D#4. No cultures  Best Practices: subQ heparin, IV PPI  Home Medications not yet resumed: atenolol, lovastatin, hydroxyzine, calcium + D, cyanocobalamin, aspirin  TPN Access: PICC placed 3/18  TPN day#: 5 (started 3/18)  Plan:  1. Continue Clinimix E 5/15 at 24m/hr + lipid 20% emulsion at 182mhr which will provide 90g protein and 1758 kcal daily- this is providing 100% of protein and 100% of kcal goals 3. Daily IV multivitamin; trace elements on MWF only d/t  national shortage 4. Continue sensitive SSI and CBGs q6h 5. [facility]. TPN labs as ordered 7. Follow for need to cycle TPN or start enteral nutrition  Beau [place]burg D. Vernette Moise, PharmD, BCPS Clinical Pharmacist Pager: [phone]/23/2015 8:48 AM

## 2013-08-20 LAB — COMPREHENSIVE METABOLIC PANEL
ALBUMIN: 2.7 g/dL — AB (ref 3.5–5.2)
ALK PHOS: 142 U/L — AB (ref 39–117)
ALT: 21 U/L (ref 0–35)
AST: 18 U/L (ref 0–37)
BUN: 25 mg/dL — ABNORMAL HIGH (ref 6–23)
CALCIUM: 8.6 mg/dL (ref 8.4–10.5)
CO2: 23 mEq/L (ref 19–32)
Chloride: 104 mEq/L (ref 96–112)
Creatinine, Ser: 0.72 mg/dL (ref 0.50–1.10)
GFR calc Af Amer: >90 mL/min (ref >90)
GFR calc non Af Amer: 83 mL/min — ABNORMAL LOW (ref >90)
Glucose, Bld: 113 mg/dL — ABNORMAL HIGH (ref 70–99)
Potassium: 4.3 mEq/L (ref 3.7–5.3)
SODIUM: 140 meq/L (ref 137–147)
TOTAL PROTEIN: 5.9 g/dL — AB (ref 6.0–8.3)
Total Bilirubin: 0.3 mg/dL (ref 0.3–1.2)

## 2013-08-20 LAB — CBC
HCT: 34.5 % — ABNORMAL LOW (ref 36.0–46.0)
HEMOGLOBIN: 11.5 g/dL — AB (ref 12.0–15.0)
MCH: 32.1 pg (ref 26.0–34.0)
MCHC: 33.3 g/dL (ref 30.0–36.0)
MCV: 96.4 fL (ref 78.0–100.0)
Platelets: 238 10*3/uL (ref 150–400)
RBC: 3.58 MIL/uL — ABNORMAL LOW (ref 3.87–5.11)
RDW: 14 % (ref 11.5–15.5)
WBC: 11.5 10*3/uL — ABNORMAL HIGH (ref 4.0–10.5)

## 2013-08-20 LAB — GLUCOSE, CAPILLARY
GLUCOSE-CAPILLARY: 124 mg/dL — AB (ref 70–99)
GLUCOSE-CAPILLARY: 142 mg/dL — AB (ref 70–99)
Glucose-Capillary: 118 mg/dL — ABNORMAL HIGH (ref 70–99)
Glucose-Capillary: 76 mg/dL (ref 70–99)

## 2013-08-20 MED ORDER — FAT EMULSION 20 % IV EMUL
250.0000 mL | INTRAVENOUS | Status: AC
  Administered 2013-08-20: 250 mL via INTRAVENOUS
  Filled 2013-08-20: qty 250

## 2013-08-20 MED ORDER — M.V.I. ADULT IV INJ
INTRAVENOUS | Status: AC
  Administered 2013-08-20: 17:00:00 via INTRAVENOUS
  Filled 2013-08-20: qty 2000

## 2013-08-20 NOTE — Progress Notes (Signed)
OT Cancellation Note  Patient Details Name: ALEYSSA PIKE MRN: [id number] DOB: 15-Feb-1941   Cancelled Treatment:    Reason Eval/Treat Not Completed: Pain limiting ability to participate - Pt deferred OT due to severity of ear pain.  Will reattempt as time allows. Darlina Rumpf Tykira Wachs, OTR/L [phone] 08/20/2013, 1:11 PM

## 2013-08-20 NOTE — Progress Notes (Signed)
Patients [name] has been clamped since 8 a.m.  Patient with only one complaint of mild nausea, resolved with Zofran.  Checked residual with 100 ccs of green fluid obtained.  [name] will remain clamped at this time and will continue to check residuals q6 hours.

## 2013-08-20 NOTE — Progress Notes (Signed)
Patient ID: [name], female   DOB: 11-02-1940, 73 y.o.   MRN: [id number] 13 Days Post-Op   Subjective: NGT output down again.  Still complaining of throat and ear pain.    Objective: Vital signs in last 24 hours: Temp:  [98.2 F (36.8 C)-98.7 F (37.1 C)] 98.2 F (36.8 C) (03/24 0502) Pulse Rate:  [91-92] 92 (03/24 0502) Resp:  [17-20] 20 (03/24 0502) BP: (114-136)/(54-59) 136/54 mmHg (03/24 0502) SpO2:  [96 %-98 %] 98 % (03/24 0502) Last BM Date: 08/18/13  Intake/Output from previous day: 03/23 0701 - 03/24 0700 In: 2333.1 [I.V.:176; IV Piggyback:300; TPN:1857.1] Out: 2850 [Urine:2100; Emesis/NG output:750] Intake/Output this shift:    PE: Gen:  Alert, NAD, pleasant.   Card:  RRR Pulm:  Breathing comfortably Abd: Soft, non distended,  incisions C/D/I with steristrips, drains out.      Lab Results:   Recent Labs  08/19/13 0500 08/20/13 0525  WBC 10.8* 11.5*  HGB 11.4* 11.5*  HCT 34.3* 34.5*  PLT 221 238   BMET  Recent Labs  08/19/13 0500 08/20/13 0525  NA 141 140  K 4.2 4.3  CL 105 104  CO2 23 23  GLUCOSE 121* 113*  BUN 26* 25*  CREATININE 0.74 0.72  CALCIUM 8.5 8.6   PT/INR No results found for this basename: LABPROT, INR,  in the last 72 hours CMP     Component Value Date/Time   NA 140 08/20/2013 0525   K 4.3 08/20/2013 0525   CL 104 08/20/2013 0525   CO2 23 08/20/2013 0525   GLUCOSE 113* 08/20/2013 0525   BUN 25* 08/20/2013 0525   CREATININE 0.72 08/20/2013 0525   CREATININE 0.81 07/01/2013 1324   CALCIUM 8.6 08/20/2013 0525   PROT 5.9* 08/20/2013 0525   ALBUMIN 2.7* 08/20/2013 0525   AST 18 08/20/2013 0525   ALT 21 08/20/2013 0525   ALKPHOS 142* 08/20/2013 0525   BILITOT 0.3 08/20/2013 0525   GFRNONAA 83* 08/20/2013 0525   GFRAA >90 08/20/2013 0525   Lipase  No results found for this basename: lipase       Studies/Results: No results found.  Anti-infectives: Anti-infectives   Start     Dose/Rate Route Frequency Ordered Stop   08/16/13 1000  metroNIDAZOLE (FLAGYL) IVPB 500 mg     500 mg 100 mL/hr over 60 Minutes Intravenous Every 8 hours 08/16/13 0854     08/15/13 0900  ciprofloxacin (CIPRO) IVPB 400 mg     400 mg 200 mL/hr over 60 Minutes Intravenous Every 12 hours 08/15/13 0752     08/08/13 0800  ertapenem (INVANZ) 1 g in sodium chloride 0.9 % 50 mL IVPB     1 g 100 mL/hr over 30 Minutes Intravenous Every 24 hours 08/07/13 1559 08/08/13 0913   08/07/13 0600  ertapenem (INVANZ) 1 g in sodium chloride 0.9 % 50 mL IVPB     1 g 100 mL/hr over 30 Minutes Intravenous On call to O.R. 08/06/13 1431 08/07/13 0829       Assessment/Plan POD 13 s/p diagnostic laparoscopy and whipple procedure for pancreatic adenocarcinoma  Plan:  SCD's, mobilization  Foley for urinary retention, will try again tomorrow.    HTN - on metoprolol   Pulmonary toilet  Delayed gastric emptying.  NGT/NPO/TNA.  Try clamping NGT this AM.   Protein calorie malnutrition - moderate - continue TNA.    Nausea - scop patch and prn phenergan- PICC/TNA  Hyperglycemia - SSI.   On home meds  Disp:  SNF vs home with son after d/c.     Hypokalemia - improved.     LOS: 13 days    Merica Prell 08/20/2013, 8:44 AM

## 2013-08-20 NOTE — Progress Notes (Signed)
PARENTERAL NUTRITION CONSULT NOTE - FOLLOW UP  Pharmacy Consult for TPN Indication: intolerance to enteral feeding  Allergies  Allergen Reactions  . Celecoxib Other (See Comments)    unknown  . Glucosamine     unknown  . Metoclopramide Hcl Other (See Comments)    shaking  . Niacin Other (See Comments)    shaking  . 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 Phenazopyridine Hcl Other (See Comments)    unknown  . Sulfonamide Derivatives Nausea And Vomiting  . Trovan [Alatrofloxacin Mesylate] Other (See Comments)    Caused shaking and nervousness  . Benzocaine-Resorcinol Rash  . Sulfa Antibiotics Rash    Patient Measurements: Height: '5\' 2"'  (157.5 cm) Weight: 162 lb 11.2 oz (73.8 kg) IBW/kg (Calculated) : 50.1  Vital Signs: Temp: 98.2 F (36.8 C) (03/24 0502) Temp src: Oral (03/24 0502) BP: 136/54 mmHg (03/24 0502) Pulse Rate: 92 (03/24 0502) Intake/Output from previous day: 03/23 0701 - 03/24 0700 In: 2333.1 [I.V.:176; IV Piggyback:300; TPN:1857.1] Out: 2850 [Urine:2100; Emesis/NG output:750] Intake/Output from this shift:    Labs:  Recent Labs  08/18/13 0500 08/19/13 0500 08/20/13 0525  WBC 11.0* 10.8* 11.5*  HGB 11.3* 11.4* 11.5*  HCT 33.7* 34.3* 34.5*  PLT 199 221 238     Recent Labs  08/18/13 0500 08/19/13 0500 08/20/13 0525  NA 140 141 140  K 4.3 4.2 4.3  CL 103 105 104  CO2 '22 23 23  ' GLUCOSE 129* 121* 113*  BUN 28* 26* 25*  CREATININE 0.76 0.74 0.72  CALCIUM 8.6 8.5 8.6  MG  --  2.3  --   PHOS  --  4.3  --   PROT 5.9* 6.0 5.9*  ALBUMIN 2.6* 2.8* 2.7*  AST '19 18 18  ' ALT '29 24 21  ' ALKPHOS 155* 144* 142*  BILITOT 0.3 0.3 0.3  TRIG  --  223*  --    Estimated Creatinine Clearance: 58.9 ml/min (by C-G formula based on Cr of 0.72).    Recent Labs  08/19/13 1749 08/20/13 0059 08/20/13 0731  GLUCAP 119* 118* 124*   Insulin Requirements in the past 24 hours:  2 units SSI  Current Nutrition:   Clinimix E 5/15 at 68m/hr + 20% lipid emulsion at 158mhr-provides 90 gm protein and 1758 kCal daily  Nutritional Goals:  1650-1850 kCal, 90-110 grams of protein per day per RD recs 3/19  Assessment:   [age]OF with pancreatic cancer s/p Whipple procedure on 3/11. She has experienced significant post-operative nausea and vomiting and is currently only taking water PO.      GI: s/p Whipple procedure 3/11.  Still unable to tolerate PO intake.  NGT output down to 75066m4hrs.  Note to try clamping NGT today. Baseline prealbumin 18 (wnl)- repeat pending  Endo: A1C 5.2 this admission. CBGs have been well controlled after surgery with SSI only. TSH was normal in January- she has been on IV Synthroid 100m68mhome dose is 175mc22m).  Lytes: Na 140, K 4.3, CoCa 9.6  Renal: SCr stable, CrCl ~60mL/67m UOP 1.2mL/kg71m  Pulm: RA  Cards: hx HTN and HLD as well as mitral valve prolapse; VSS. Receiving IV scheduled metoprolol- other home CV medications have not yet been resumed.   Hepatobil: alk phos elevated but continues to trend down, albumin increased to 2.8, bit still low. Other LFTs wnl. Baseline triglycerides elevated at 282- now have decreased to 223  Neuro: hx anxiety and depression on gabapentin, topiramate  and Prozac.  ID: WBC 11.5, afebrile. Cipro D#6, Flagyl D#5. No cultures  Best Practices: subQ heparin, IV PPI  Home Medications not yet resumed: atenolol, lovastatin, hydroxyzine, calcium + D, cyanocobalamin, aspirin  TPN Access: PICC placed 3/18  TPN day#: 7 (started 3/18)  Plan:  Continue Clinimix E 5/15 at 31m/hr + 20% lipid emulsion at 158mhr which will provide 90 g protein and 1758 kCal daily Daily IV multivitamin; trace elements on MWF only d/t national shortage Continue sensitive SSI and CBGs q6h Continue [facility] Follow for need to cycle TPN or start enteral nutrition depending on outcome of NGT clamping  MiLegrand Como Pharm.D., BCPS, AAHIVP Clinical Pharmacist Phone: [phone]/24/2015, 9:44 AM

## 2013-08-21 ENCOUNTER — Other Ambulatory Visit: Payer: Self-pay | Admitting: *Deleted

## 2013-08-21 DIAGNOSIS — E440 Moderate protein-calorie malnutrition: Secondary | ICD-10-CM | POA: Diagnosis present

## 2013-08-21 LAB — COMPREHENSIVE METABOLIC PANEL
ALK PHOS: 145 U/L — AB (ref 39–117)
ALT: 19 U/L (ref 0–35)
AST: 21 U/L (ref 0–37)
Albumin: 2.7 g/dL — ABNORMAL LOW (ref 3.5–5.2)
BUN: 25 mg/dL — ABNORMAL HIGH (ref 6–23)
CO2: 22 meq/L (ref 19–32)
Calcium: 8.5 mg/dL (ref 8.4–10.5)
Chloride: 100 mEq/L (ref 96–112)
Creatinine, Ser: 0.73 mg/dL (ref 0.50–1.10)
GFR calc non Af Amer: 83 mL/min — ABNORMAL LOW (ref >90)
GLUCOSE: 122 mg/dL — AB (ref 70–99)
POTASSIUM: 4.2 meq/L (ref 3.7–5.3)
SODIUM: 137 meq/L (ref 137–147)
Total Bilirubin: <0.2 mg/dL — ABNORMAL LOW (ref 0.3–1.2)
Total Protein: 5.9 g/dL — ABNORMAL LOW (ref 6.0–8.3)

## 2013-08-21 LAB — GLUCOSE, CAPILLARY
GLUCOSE-CAPILLARY: 121 mg/dL — AB (ref 70–99)
GLUCOSE-CAPILLARY: 122 mg/dL — AB (ref 70–99)
GLUCOSE-CAPILLARY: 122 mg/dL — AB (ref 70–99)
GLUCOSE-CAPILLARY: 136 mg/dL — AB (ref 70–99)
Glucose-Capillary: 128 mg/dL — ABNORMAL HIGH (ref 70–99)

## 2013-08-21 LAB — CBC
HCT: 35.1 % — ABNORMAL LOW (ref 36.0–46.0)
Hemoglobin: 11.7 g/dL — ABNORMAL LOW (ref 12.0–15.0)
MCH: 32.2 pg (ref 26.0–34.0)
MCHC: 33.3 g/dL (ref 30.0–36.0)
MCV: 96.7 fL (ref 78.0–100.0)
PLATELETS: 246 10*3/uL (ref 150–400)
RBC: 3.63 MIL/uL — AB (ref 3.87–5.11)
RDW: 14.2 % (ref 11.5–15.5)
WBC: 11.9 10*3/uL — ABNORMAL HIGH (ref 4.0–10.5)

## 2013-08-21 LAB — PREALBUMIN: PREALBUMIN: 23.4 mg/dL (ref 17.0–34.0)

## 2013-08-21 MED ORDER — FAT EMULSION 20 % IV EMUL
250.0000 mL | INTRAVENOUS | Status: AC
  Administered 2013-08-21: 250 mL via INTRAVENOUS
  Filled 2013-08-21: qty 250

## 2013-08-21 MED ORDER — TRACE MINERALS CR-CU-F-FE-I-MN-MO-SE-ZN IV SOLN
INTRAVENOUS | Status: AC
  Administered 2013-08-21: 18:00:00 via INTRAVENOUS
  Filled 2013-08-21: qty 2000

## 2013-08-21 NOTE — Progress Notes (Signed)
Patient ID: [name], female   DOB: Oct 19, 1940, 73 y.o.   MRN: [id number] 14 Days Post-Op   Subjective: No nausea with NGT clamped.  .    Objective: Vital signs in last 24 hours: Temp:  [98.2 F (36.8 C)-98.5 F (36.9 C)] 98.2 F (36.8 C) (03/25 0504) Pulse Rate:  [90-99] 98 (03/25 0504) Resp:  [16-20] 16 (03/25 0504) BP: (118-134)/(51-67) 118/60 mmHg (03/25 0504) SpO2:  [94 %-99 %] 94 % (03/25 0504) Last BM Date: 08/19/13  Intake/Output from previous day: 03/24 0701 - 03/25 0700 In: 3351.4 [P.O.:480; I.V.:464.3; IV Piggyback:500; TPN:1907.1] Out: 2350 [Urine:2250; Emesis/NG output:100] Intake/Output this shift:    PE: Gen:  Alert, NAD, pleasant.   Card:  RRR Pulm:  Breathing comfortably Abd: Soft, non distended,  incisions C/D/I with steristrips, drains out.      Lab Results:   Recent Labs  08/20/13 0525 08/21/13 0445  WBC 11.5* 11.9*  HGB 11.5* 11.7*  HCT 34.5* 35.1*  PLT 238 246   BMET  Recent Labs  08/20/13 0525 08/21/13 0445  NA 140 137  K 4.3 4.2  CL 104 100  CO2 23 22  GLUCOSE 113* 122*  BUN 25* 25*  CREATININE 0.72 0.73  CALCIUM 8.6 8.5   PT/INR No results found for this basename: LABPROT, INR,  in the last 72 hours CMP     Component Value Date/Time   NA 137 08/21/2013 0445   K 4.2 08/21/2013 0445   CL 100 08/21/2013 0445   CO2 22 08/21/2013 0445   GLUCOSE 122* 08/21/2013 0445   BUN 25* 08/21/2013 0445   CREATININE 0.73 08/21/2013 0445   CREATININE 0.81 07/01/2013 1324   CALCIUM 8.5 08/21/2013 0445   PROT 5.9* 08/21/2013 0445   ALBUMIN 2.7* 08/21/2013 0445   AST 21 08/21/2013 0445   ALT 19 08/21/2013 0445   ALKPHOS 145* 08/21/2013 0445   BILITOT <0.2* 08/21/2013 0445   GFRNONAA 83* 08/21/2013 0445   GFRAA >90 08/21/2013 0445   Lipase  No results found for this basename: lipase       Studies/Results: No results found.  Anti-infectives: Anti-infectives   Start     Dose/Rate Route Frequency Ordered Stop   08/16/13 1000   metroNIDAZOLE (FLAGYL) IVPB 500 mg     500 mg 100 mL/hr over 60 Minutes Intravenous Every 8 hours 08/16/13 0854     08/15/13 0900  ciprofloxacin (CIPRO) IVPB 400 mg     400 mg 200 mL/hr over 60 Minutes Intravenous Every 12 hours 08/15/13 0752     08/08/13 0800  ertapenem (INVANZ) 1 g in sodium chloride 0.9 % 50 mL IVPB     1 g 100 mL/hr over 30 Minutes Intravenous Every 24 hours 08/07/13 1559 08/08/13 0913   08/07/13 0600  ertapenem (INVANZ) 1 g in sodium chloride 0.9 % 50 mL IVPB     1 g 100 mL/hr over 30 Minutes Intravenous On call to O.R. 08/06/13 1431 08/07/13 0829       Assessment/Plan POD 14 s/p diagnostic laparoscopy and whipple procedure for pancreatic adenocarcinoma  Plan:  SCD's, mobilization  Foley for urinary retention,d/c again today.     HTN - on metoprolol   Pulmonary toilet  Delayed gastric emptying.  Try d/c NGT today.  Remain npo except ice chips/sips with meds.   Protein calorie malnutrition - moderate - continue TNA.    Nausea -improved PICC/TNA  Hyperglycemia - SSI.   On home meds   Disp:  SNF vs  home with son after d/c.        LOS: 14 days    Danyia Borunda 08/21/2013, 9:03 AM

## 2013-08-21 NOTE — Progress Notes (Signed)
PARENTERAL NUTRITION CONSULT NOTE - FOLLOW UP  Pharmacy Consult for TPN Indication: intolerance to enteral feeding  Allergies  Allergen Reactions  . Celecoxib Other (See Comments)    unknown  . Glucosamine     unknown  . Metoclopramide Hcl Other (See Comments)    shaking  . Niacin Other (See Comments)    shaking  . 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 Phenazopyridine Hcl Other (See Comments)    unknown  . Sulfonamide Derivatives Nausea And Vomiting  . Trovan [Alatrofloxacin Mesylate] Other (See Comments)    Caused shaking and nervousness  . Benzocaine-Resorcinol Rash  . Sulfa Antibiotics Rash    Patient Measurements: Height: 5' 2" (157.5 cm) Weight: 162 lb 11.2 oz (73.8 kg) IBW/kg (Calculated) : 50.1  Vital Signs: Temp: 98.2 F (36.8 C) (03/25 0504) Temp src: Oral (03/25 0504) BP: 118/60 mmHg (03/25 0504) Pulse Rate: 98 (03/25 0504) Intake/Output from previous day: 03/24 0701 - 03/25 0700 In: 3351.4 [P.O.:480; I.V.:464.3; IV Piggyback:500; TPN:1907.1] Out: 2350 [Urine:2250; Emesis/NG output:100] Intake/Output from this shift:    Labs:  Recent Labs  08/19/13 0500 08/20/13 0525 08/21/13 0445  WBC 10.8* 11.5* 11.9*  HGB 11.4* 11.5* 11.7*  HCT 34.3* 34.5* 35.1*  PLT 221 238 246     Recent Labs  08/19/13 0500 08/20/13 0525 08/21/13 0445  NA 141 140 137  K 4.2 4.3 4.2  CL 105 104 100  CO2 _0 GLUCOSE 121* 113* 122*  BUN 26* 25* 25*  CREATININE 0.74 0.72 0.73  CALCIUM 8.5 8.6 8.5  MG 2.3  --   --   PHOS 4.3  --   --   PROT 6.0 5.9* 5.9*  ALBUMIN 2.8* 2.7* 2.7*  AST _1 ALT _2 ALKPHOS 144* 142* 145*  BILITOT 0.3 0.3 <0.2*  PREALBUMIN 23.4  --   --   TRIG 223*  --   --    Estimated Creatinine Clearance: 58.9 ml/min (by C-G formula based on Cr of 0.73).    Recent Labs  08/20/13 1752 08/21/13 0021 08/21/13 0543  GLUCAP 76 122* 122*   Insulin Requirements in the past 24 hours:   3 units SSI  Current Nutrition:  Clinimix E 5/15 at 73m/hr + 20% lipid emulsion at 160mhr-provides 90 gm protein and 1758 kCal daily  Nutritional Goals:  1650-1850 kCal, 90-110 grams of protein per day per RD recs 3/19  Assessment:   [id number] with pancreatic cancer s/p Whipple procedure on 3/11. She has experienced significant post-operative nausea and vomiting and is currently only taking water PO.      GI: s/p Whipple procedure 3/11.  Has had difficulties with nausea and has been intolerant of PO intake.  NGT output down to 10047m4hrs.  NGT was clamped with no nausea on 3/24 and will be dc'd today. To remain NPO.  Baseline prealbumin 18 (wnl)- now up to 23.4.  Endo: A1C 5.2 this admission. CBGs have been well controlled after surgery with SSI only. TSH was normal in January- she has been on IV Synthroid 100m52mhome dose is 175mc82m).  Lytes: Na 137, K 4.2  Renal: SCr stable, CrCl ~60mL/20m UOP 1.3mL/kg74m  Pulm: RA  Cards: hx HTN and HLD as well as mitral valve prolapse; VSS. Receiving IV scheduled metoprolol- other home CV medications have not yet been resumed.   Hepatobil: alk phos elevated but is stable, albumin stable at 2.7,  but still low. Other LFTs wnl. Baseline triglycerides elevated at 282- now have decreased to 223  Neuro: hx anxiety and depression on gabapentin, topiramate and Prozac.  ID: WBC 11.9, afebrile. Cipro D#7, Flagyl D#6. No cultures  Best Practices: subQ heparin, IV PPI  Home Medications not yet resumed: atenolol, lovastatin, hydroxyzine, calcium + D, cyanocobalamin, aspirin  TPN Access: PICC placed 3/18  TPN day#: 8 (started 3/18)  Plan:  Continue Clinimix E 5/15 at 51m/hr + 20% lipid emulsion at 161mhr which will provide 90 g protein and 1758 kCal daily Daily IV multivitamin; trace elements on MWF only d/t national shortage Continue sensitive SSI and CBGs q6h Continue MIVF as 1/2NS at [facility] on Thursday 3/26 Follow for need to cycle  TPN or start enteral nutrition depending on outcome of NGT removal.    MiLegrand ComoPharm.D., BCPS, AAHIVP Clinical Pharmacist Phone: [phone]/25/2015, 9:28 AM

## 2013-08-21 NOTE — Progress Notes (Addendum)
Occupational Therapy Treatment Patient Details Name: [name] MRN: [id number] DOB: 08/06/1940 Today's Date: 08/21/2013    History of present illness admitted with adenocarcinoma of the pancreatic head, s/p whipple procedure, stenting and diagnostic lap.   OT comments  Pt limited due to feeling nauseated and dizzy (suspect due to meds). Pt performed ADLs during session and ambulated in room.   Follow Up Recommendations  SNF;Supervision/Assistance - 24 hour    Equipment Recommendations  3 in 1 bedside comode    Recommendations for Other Services      Precautions / Restrictions Precautions Precautions: Fall Precaution Comments: adominal wounds;        Mobility Bed Mobility Overal bed mobility: Needs Assistance Bed Mobility: Sit to Supine       Sit to supine: Supervision   General bed mobility comments: Supervision for safety-pt not feeling well.  Transfers Overall transfer level: Needs assistance Equipment used: Rolling walker (2 wheeled) Transfers: Sit to/from Stand Sit to Stand: Min guard         General transfer comment: cues for hand placement.    Balance                                   ADL   Grooming: Brushing hair;Oral care;Standing;Applying deodorant;Sitting;Wash/dry face (Min guard for standing tasks due to pt feeling dizzy/nauseated; applied Deodorant and washed face-sitting)       Lower Body Dressing: Sitting/lateral leans;Supervision/safety (doffed socks) Toilet Transfer: Min guard;BSC;Comfort height toilet;Grab bars;RW     Functional mobility during ADLs: Min guard;Rolling walker General ADL Comments: Pt ambulated to bathroom and performed grooming at sink. She practiced toilet transfer and then ambulated some in room. Pt sat EOB and put on deodorant and also doffed socks with legs on bed. Spoke with pt about using bag on walker to carry items.      Vision                     Perception     Praxis       Cognition   Behavior During Therapy: [facility] for tasks assessed/performed Overall Cognitive Status: Within Functional Limits for tasks assessed                       Extremity/Trunk Assessment               Exercises       General Comments      Pertinent Vitals/ Pain       Pt feeling dizzy and nauseous. Nurse notified.   Home Living                                          Prior Functioning/Environment              Frequency Min 2X/week     Progress Toward Goals  OT Goals(current goals can now be found in the care plan section)  Progress towards OT goals: Progressing toward goals-updated some goals  Acute Rehab OT Goals Patient Stated Goal: not stated OT Goal Formulation: With patient Time For Goal Achievement: 08/26/13 Potential to Achieve Goals: Good ADL Goals Pt Will Perform Grooming: with set-up;with supervision;standing (2 tasks at sink) Pt Will Perform Lower Body Dressing: with set-up;with supervision;sit to/from stand Pt Will Transfer to Toilet: with supervision;ambulating;grab  bars Pt Will Perform Toileting - Clothing Manipulation and hygiene: with min assist;sit to/from stand Additional ADL Goal #1: Pt will be min guard A in and OOB for BADLs  Plan Discharge plan remains appropriate    End of Session Equipment Utilized During Treatment: Gait belt;Rolling walker  Activity Tolerance Other (comment) (dizziness; nauseated)   Patient Left in bed;with call bell/phone within reach   Nurse Communication Mobility status;Other (comment) (feeling nauseous and dizzy)        Time: 6812-7517 OT Time Calculation (min): 14 min  Charges: OT General Charges $OT Visit: 1 Procedure OT Treatments $Self Care/Home Management : 8-22 mins  Benito Mccreedy OTR/L [phone] 08/21/2013, 2:00 PM

## 2013-08-21 NOTE — Progress Notes (Signed)
Physical Therapy Treatment Patient Details Name: [name] MRN: [id number] DOB: 27-Feb-1941 Today's Date: 08/21/2013    History of Present Illness admitted with adenocarcinoma of the pancreatic head, s/p whipple procedure, stenting and diagnostic lap.    PT Comments    Progressing well. Requires increased timing. Continue to recommend SNF for ongoing Physical Therapy as patient does not have assistnace at home.     Follow Up Recommendations  SNF;Supervision for mobility/OOB     Equipment Recommendations       Recommendations for Other Services       Precautions / Restrictions Precautions Precaution Comments: adominal wounds;     Mobility  Bed Mobility                  Transfers       Sit to Stand: Min guard         General transfer comment: Cues for safe hand placement  Ambulation/Gait Ambulation/Gait assistance: Min guard Ambulation Distance (Feet): 200 Feet Assistive device: Rolling walker (2 wheeled) Gait Pattern/deviations: Step-through pattern;Decreased stride length Gait velocity: decr   General Gait Details: Cues to stand upright and tall. Better endurance this session   Stairs            Wheelchair Mobility    Modified Rankin (Stroke Patients Only)       Balance                                    Cognition Arousal/Alertness: Awake/alert Behavior During Therapy: WFL for tasks assessed/performed Overall Cognitive Status: Within Functional Limits for tasks assessed                      Exercises      General Comments        Pertinent Vitals/Pain no apparent distress     Home Living                      Prior Function            PT Goals (current goals can now be found in the care plan section) Progress towards PT goals: Progressing toward goals    Frequency  Min 3X/week    PT Plan Current plan remains appropriate    End of Session Equipment Utilized During Treatment:  Gait belt Activity Tolerance: Patient tolerated treatment well Patient left: in chair;with call bell/phone within reach;with family/visitor present     Time: 2536-6440 PT Time Calculation (min): 24 min  Charges:  $Gait Training: 8-22 mins $Therapeutic Activity: 8-22 mins                    G Codes:      [name] 08/21/2013, 12:56 PM  08/21/2013 [name] PTA [phone] pager [phone] office

## 2013-08-22 ENCOUNTER — Inpatient Hospital Stay (HOSPITAL_COMMUNITY): Payer: Medicare Other

## 2013-08-22 ENCOUNTER — Other Ambulatory Visit: Payer: Self-pay | Admitting: *Deleted

## 2013-08-22 LAB — COMPREHENSIVE METABOLIC PANEL
ALBUMIN: 3.5 g/dL (ref 3.5–5.2)
ALT: 22 U/L (ref 0–35)
AST: 21 U/L (ref 0–37)
Alkaline Phosphatase: 164 U/L — ABNORMAL HIGH (ref 39–117)
BILIRUBIN TOTAL: 0.3 mg/dL (ref 0.3–1.2)
BUN: 29 mg/dL — AB (ref 6–23)
CALCIUM: 9.4 mg/dL (ref 8.4–10.5)
CHLORIDE: 101 meq/L (ref 96–112)
CO2: 22 mEq/L (ref 19–32)
CREATININE: 0.72 mg/dL (ref 0.50–1.10)
GFR calc Af Amer: >90 mL/min (ref >90)
GFR, EST NON AFRICAN AMERICAN: 83 mL/min — AB (ref >90)
Glucose, Bld: 150 mg/dL — ABNORMAL HIGH (ref 70–99)
Potassium: 4.3 mEq/L (ref 3.7–5.3)
Sodium: 139 mEq/L (ref 137–147)
Total Protein: 7.3 g/dL (ref 6.0–8.3)

## 2013-08-22 LAB — CBC
HEMATOCRIT: 39.5 % (ref 36.0–46.0)
Hemoglobin: 13.4 g/dL (ref 12.0–15.0)
MCH: 32.5 pg (ref 26.0–34.0)
MCHC: 33.9 g/dL (ref 30.0–36.0)
MCV: 95.9 fL (ref 78.0–100.0)
Platelets: 300 10*3/uL (ref 150–400)
RBC: 4.12 MIL/uL (ref 3.87–5.11)
RDW: 14 % (ref 11.5–15.5)
WBC: 13.3 10*3/uL — AB (ref 4.0–10.5)

## 2013-08-22 LAB — PHOSPHORUS: PHOSPHORUS: 4.4 mg/dL (ref 2.3–4.6)

## 2013-08-22 LAB — GLUCOSE, CAPILLARY
Glucose-Capillary: 151 mg/dL — ABNORMAL HIGH (ref 70–99)
Glucose-Capillary: 117 mg/dL — ABNORMAL HIGH (ref 70–99)
Glucose-Capillary: 124 mg/dL — ABNORMAL HIGH (ref 70–99)

## 2013-08-22 LAB — MAGNESIUM: MAGNESIUM: 2.5 mg/dL (ref 1.5–2.5)

## 2013-08-22 MED ORDER — IOHEXOL 300 MG/ML  SOLN
150.0000 mL | Freq: Once | INTRAMUSCULAR | Status: AC | PRN
  Administered 2013-08-22: 150 mL via ORAL

## 2013-08-22 MED ORDER — M.V.I. ADULT IV INJ
INTRAVENOUS | Status: AC
  Administered 2013-08-22: 18:00:00 via INTRAVENOUS
  Filled 2013-08-22: qty 2000

## 2013-08-22 MED ORDER — FAT EMULSION 20 % IV EMUL
250.0000 mL | INTRAVENOUS | Status: AC
  Administered 2013-08-22: 250 mL via INTRAVENOUS
  Filled 2013-08-22: qty 250

## 2013-08-22 NOTE — Progress Notes (Signed)
CSW continues to follow for support and assistance with SNF at d/c.    SNF bed choice is at [facility] and CSW contacted facility with an update on Pt d/c status. Janie not available, CSW left a message for a call back.    CSW will continue to follow Pt for d/c planning.    [facility]  [address];  [phone] Phone: [phone]

## 2013-08-22 NOTE — Progress Notes (Signed)
Pt had complaint of nausea and vomiting through out the night antiemetic medications were not effective scopolamine patch was placed not effective I informed that I would have to call MD and that the NG tube may have to be replaced she stated she is not having that tube in her nose and didn't want me to call MD. I told that from a nurse stand point that I can't help control her nausea and vomiting and that I was doing doing all that I am ordered to do. She voiced that she understood but still insisted that MD not be notified. She refused all her PO meds. Will continue to monitor. Arthor Captain LPN

## 2013-08-22 NOTE — Progress Notes (Signed)
Patient ID: [name], female   DOB: 22-Nov-1940, 73 y.o.   MRN: [id number] 15 Days Post-Op   Subjective: Significant nausea last night.  Pt refusing NGT.    Objective: Vital signs in last 24 hours: Temp:  [98 F (36.7 C)] 98 F (36.7 C) (03/26 0534) Pulse Rate:  [102-120] 120 (03/26 0534) Resp:  [18] 18 (03/26 0534) BP: (130-146)/(61-82) 146/82 mmHg (03/26 0534) SpO2:  [96 %-98 %] 98 % (03/26 0534) Last BM Date: 08/21/13  Intake/Output from previous day: 03/25 0701 - 03/26 0700 In: 900 [IV Piggyback:900] Out: 1350 [Urine:1350] Intake/Output this shift:    PE: Gen:  Alert, NAD, pleasant.  Looks nauseated. Card:  RRR Pulm:  Breathing comfortably Abd: Soft, non distended,  incisions C/D/I with steristrips, drains out.      Lab Results:   Recent Labs  08/21/13 0445 08/22/13 0545  WBC 11.9* 13.3*  HGB 11.7* 13.4  HCT 35.1* 39.5  PLT 246 300   BMET  Recent Labs  08/21/13 0445 08/22/13 0545  NA 137 139  K 4.2 4.3  CL 100 101  CO2 22 22  GLUCOSE 122* 150*  BUN 25* 29*  CREATININE 0.73 0.72  CALCIUM 8.5 9.4   PT/INR No results found for this basename: LABPROT, INR,  in the last 72 hours CMP     Component Value Date/Time   NA 139 08/22/2013 0545   K 4.3 08/22/2013 0545   CL 101 08/22/2013 0545   CO2 22 08/22/2013 0545   GLUCOSE 150* 08/22/2013 0545   BUN 29* 08/22/2013 0545   CREATININE 0.72 08/22/2013 0545   CREATININE 0.81 07/01/2013 1324   CALCIUM 9.4 08/22/2013 0545   PROT 7.3 08/22/2013 0545   ALBUMIN 3.5 08/22/2013 0545   AST 21 08/22/2013 0545   ALT 22 08/22/2013 0545   ALKPHOS 164* 08/22/2013 0545   BILITOT 0.3 08/22/2013 0545   GFRNONAA 83* 08/22/2013 0545   GFRAA >90 08/22/2013 0545   Lipase  No results found for this basename: lipase       Studies/Results: No results found.  Anti-infectives: Anti-infectives   Start     Dose/Rate Route Frequency Ordered Stop   08/16/13 1000  metroNIDAZOLE (FLAGYL) IVPB 500 mg  Status:  Discontinued     500 mg 100 mL/hr over 60 Minutes Intravenous Every 8 hours 08/16/13 0854 08/22/13 0957   08/15/13 0900  ciprofloxacin (CIPRO) IVPB 400 mg  Status:  Discontinued     400 mg 200 mL/hr over 60 Minutes Intravenous Every 12 hours 08/15/13 0752 08/22/13 0957   08/08/13 0800  ertapenem (INVANZ) 1 g in sodium chloride 0.9 % 50 mL IVPB     1 g 100 mL/hr over 30 Minutes Intravenous Every 24 hours 08/07/13 1559 08/08/13 0913   08/07/13 0600  ertapenem (INVANZ) 1 g in sodium chloride 0.9 % 50 mL IVPB     1 g 100 mL/hr over 30 Minutes Intravenous On call to O.R. 08/06/13 1431 08/07/13 0829       Assessment/Plan POD 14 s/p diagnostic laparoscopy and whipple procedure for pancreatic adenocarcinoma  Plan:  SCD's, mobilization  Foley for urinary retention, check post void residuals.  If they get to 300, replace foley.    HTN - on metoprolol   Pulmonary toilet  Delayed gastric emptying.    Remain npo except ice chips/sips with meds.    Nausea -bad again.  NGT if pt will consent.  Antiemetics.  Upper GI.   Protein calorie malnutrition - moderate -  continue TNA.     Hyperglycemia - SSI.   On home meds   Disp:  SNF vs home with son after d/c.        LOS: 15 days    [name] 08/22/2013, 9:57 AM

## 2013-08-22 NOTE — Progress Notes (Signed)
PARENTERAL NUTRITION CONSULT NOTE - FOLLOW UP  Pharmacy Consult for TPN Indication: intolerance to enteral feeding  Allergies  Allergen Reactions  . Celecoxib Other (See Comments)    unknown  . Glucosamine     unknown  . Metoclopramide Hcl Other (See Comments)    shaking  . Niacin Other (See Comments)    shaking  . 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 Phenazopyridine Hcl Other (See Comments)    unknown  . Sulfonamide Derivatives Nausea And Vomiting  . Trovan [Alatrofloxacin Mesylate] Other (See Comments)    Caused shaking and nervousness  . Benzocaine-Resorcinol Rash  . Sulfa Antibiotics Rash    Patient Measurements: Height: '5\' 2"'  (157.5 cm) Weight: 162 lb 11.2 oz (73.8 kg) IBW/kg (Calculated) : 50.1  Vital Signs: Temp: 98 F (36.7 C) (03/26 0534) Temp src: Oral (03/26 0534) BP: 146/82 mmHg (03/26 0534) Pulse Rate: 120 (03/26 0534) Intake/Output from previous day: 03/25 0701 - 03/26 0700 In: 900 [IV Piggyback:900] Out: 1350 [Urine:1350] Intake/Output from this shift:    Labs:  Recent Labs  08/20/13 0525 08/21/13 0445 08/22/13 0545  WBC 11.5* 11.9* 13.3*  HGB 11.5* 11.7* 13.4  HCT 34.5* 35.1* 39.5  PLT 238 246 300     Recent Labs  08/20/13 0525 08/21/13 0445 08/22/13 0545  NA 140 137 139  K 4.3 4.2 4.3  CL 104 100 101  CO2 '23 22 22  ' GLUCOSE 113* 122* 150*  BUN 25* 25* 29*  CREATININE 0.72 0.73 0.72  CALCIUM 8.6 8.5 9.4  MG  --   --  2.5  PHOS  --   --  4.4  PROT 5.9* 5.9* 7.3  ALBUMIN 2.7* 2.7* 3.5  AST '18 21 21  ' ALT '21 19 22  ' ALKPHOS 142* 145* 164*  BILITOT 0.3 <0.2* 0.3   Estimated Creatinine Clearance: 58.9 ml/min (by C-G formula based on Cr of 0.72).    Recent Labs  08/21/13 1853 08/21/13 2336 08/22/13 0532  GLUCAP 121* 136* 151*   Insulin Requirements in the past 24 hours:  5 units SSI  Current Nutrition:  Clinimix E 5/15 at 44m/hr + 20% lipid emulsion at 154mhr-provides 90 gm  protein and 1758 kCal daily  Nutritional Goals:  1650-1850 kCal, 90-110 grams of protein per day per RD recs 3/19  Assessment:   [name] with pancreatic cancer s/p Whipple procedure on 3/11. She has experienced significant post-operative nausea and vomiting and is currently only taking water PO.      GI: s/p Whipple procedure 3/11.  Has had difficulties with nausea and has been intolerant of PO intake.  NGT was discontinued on 3/25 but note that she had significant nausea and vomiting overnight and is currently NPO.  Baseline prealbumin 18 (wnl)- now up to 23.4.  Endo: A1C 5.2 this admission. CBGs have been well controlled after surgery with SSI only. TSH was normal in January- she has been on IV Synthroid 10080m(home dose is 175m21mo).  Lytes: Na 139, K 4.3, Mag 2.5, Phos 4.4.  Renal: SCr stable, CrCl ~60mL67m; UOP 0.8mL/k62mr  Pulm: RA  Cards: hx HTN and HLD as well as mitral valve prolapse; VSS. Receiving IV scheduled metoprolol- other home CV medications have not yet been resumed.   Hepatobil: alk phos elevated but is stable, albumin stable at 2.7, but still low. Other LFTs wnl. Baseline triglycerides elevated at 282- now have decreased to 223  Neuro: hx anxiety and depression  on gabapentin, topiramate and Prozac.  ID: WBC 13.3, afebrile. Cipro D#8, Flagyl D#7. No cultures  Best Practices: subQ heparin, IV PPI  Home Medications not yet resumed: atenolol, lovastatin, hydroxyzine, calcium + D, cyanocobalamin, aspirin  TPN Access: PICC placed 3/18  TPN day#: 9 (started 3/18)  Plan:  Continue Clinimix E 5/15 at 52m/hr + 20% lipid emulsion at 152mhr which will provide 90 g protein and 1758 kCal daily Daily IV multivitamin; trace elements on MWF only d/t national shortage Continue sensitive SSI and CBGs q6h Continue [facility] CMET which is adequate for Friday 3/27.  [name] ComoPharm.D., BCPS, AAHIVP Clinical Pharmacist Phone:  [phone]/26/2015, 9:07 AM

## 2013-08-23 ENCOUNTER — Telehealth: Payer: Self-pay | Admitting: Oncology

## 2013-08-23 LAB — CBC
HCT: 37.4 % (ref 36.0–46.0)
Hemoglobin: 12.7 g/dL (ref 12.0–15.0)
MCH: 32.6 pg (ref 26.0–34.0)
MCHC: 34 g/dL (ref 30.0–36.0)
MCV: 96.1 fL (ref 78.0–100.0)
PLATELETS: 293 10*3/uL (ref 150–400)
RBC: 3.89 MIL/uL (ref 3.87–5.11)
RDW: 14.2 % (ref 11.5–15.5)
WBC: 12.6 10*3/uL — ABNORMAL HIGH (ref 4.0–10.5)

## 2013-08-23 LAB — COMPREHENSIVE METABOLIC PANEL
ALT: 27 U/L (ref 0–35)
AST: 38 U/L — AB (ref 0–37)
Albumin: 3.3 g/dL — ABNORMAL LOW (ref 3.5–5.2)
Alkaline Phosphatase: 155 U/L — ABNORMAL HIGH (ref 39–117)
BUN: 33 mg/dL — ABNORMAL HIGH (ref 6–23)
CALCIUM: 9 mg/dL (ref 8.4–10.5)
CO2: 22 mEq/L (ref 19–32)
Chloride: 101 mEq/L (ref 96–112)
Creatinine, Ser: 0.82 mg/dL (ref 0.50–1.10)
GFR calc Af Amer: 80 mL/min — ABNORMAL LOW (ref >90)
GFR calc non Af Amer: 69 mL/min — ABNORMAL LOW (ref >90)
Glucose, Bld: 130 mg/dL — ABNORMAL HIGH (ref 70–99)
Potassium: 4.7 mEq/L (ref 3.7–5.3)
SODIUM: 138 meq/L (ref 137–147)
Total Bilirubin: 0.3 mg/dL (ref 0.3–1.2)
Total Protein: 6.7 g/dL (ref 6.0–8.3)

## 2013-08-23 LAB — GLUCOSE, CAPILLARY
GLUCOSE-CAPILLARY: 127 mg/dL — AB (ref 70–99)
GLUCOSE-CAPILLARY: 98 mg/dL (ref 70–99)
Glucose-Capillary: 117 mg/dL — ABNORMAL HIGH (ref 70–99)
Glucose-Capillary: 129 mg/dL — ABNORMAL HIGH (ref 70–99)
Glucose-Capillary: 135 mg/dL — ABNORMAL HIGH (ref 70–99)
Glucose-Capillary: 145 mg/dL — ABNORMAL HIGH (ref 70–99)

## 2013-08-23 MED ORDER — GABAPENTIN 100 MG PO CAPS
200.0000 mg | ORAL_CAPSULE | Freq: Three times a day (TID) | ORAL | Status: DC
  Administered 2013-08-24 – 2013-09-12 (×47): 200 mg via ORAL
  Filled 2013-08-23 (×68): qty 2

## 2013-08-23 MED ORDER — BOOST / RESOURCE BREEZE PO LIQD
1.0000 | ORAL | Status: DC
  Administered 2013-08-23 – 2013-09-03 (×9): 1 via ORAL

## 2013-08-23 MED ORDER — GABAPENTIN 300 MG PO CAPS
300.0000 mg | ORAL_CAPSULE | Freq: Every day | ORAL | Status: DC
  Administered 2013-08-25 – 2013-09-11 (×18): 300 mg via ORAL
  Filled 2013-08-23 (×24): qty 1

## 2013-08-23 MED ORDER — PANTOPRAZOLE SODIUM 40 MG PO PACK
40.0000 mg | PACK | Freq: Every day | ORAL | Status: DC
  Filled 2013-08-23: qty 20

## 2013-08-23 MED ORDER — PANTOPRAZOLE SODIUM 40 MG PO TBEC
40.0000 mg | DELAYED_RELEASE_TABLET | Freq: Every day | ORAL | Status: DC
  Administered 2013-08-24 – 2013-09-11 (×19): 40 mg via ORAL
  Filled 2013-08-23 (×20): qty 1

## 2013-08-23 MED ORDER — FAT EMULSION 20 % IV EMUL
250.0000 mL | INTRAVENOUS | Status: AC
  Administered 2013-08-23: 250 mL via INTRAVENOUS
  Filled 2013-08-23: qty 250

## 2013-08-23 MED ORDER — TRACE MINERALS CR-CU-F-FE-I-MN-MO-SE-ZN IV SOLN
INTRAVENOUS | Status: AC
  Administered 2013-08-23: 18:00:00 via INTRAVENOUS
  Filled 2013-08-23: qty 2000

## 2013-08-23 NOTE — Progress Notes (Signed)
NUTRITION FOLLOW UP  Intervention:    TPN per Pharmacy  Diet advancement per MD  Resource Breeze daily, each supplement provides 250 kcal and 9 grams protein  RD to follow nutrition Care Plan  Nutrition Dx:   Malnutrition related to altered GI function as evidenced by intake </= 50% of estimated energy requirement for >/= 5 days and mild loss of muscle mass, ongoing-TPN now meeting estimated energy needs  Goal:   Intake to meet >90% of estimated nutrition needs, met  Monitor:   TPN adequacy/tolerance, diet advancement/tolerance, PO intake, labs, weight trend.  Assessment:   Patient presented to the hospital on 3/11 for diagnostic laparoscopy and Whipple procedure for pancreatic cancer.  Patient was NPO or on clear liquids for the first 5 days of admission. Diet advanced to full liquids on 3/17. Patient has had a lot of nausea and vomiting, not tolerating full liquid diet. TPN was initiated 3/18.  Patient reports nausea and vomiting since surgery last week. She has been unable to keep down any liquids this morning. Patient reports good appetite and good oral intake with stable weight PTA.   Patient is receiving TPN with Clinimix E 5/15 @ 75 ml/hr and lipids @ 10 ml/hr. Provides 1758 kcal, and 90 grams protein per day. Meets 107% minimum estimated energy needs and 100% minimum estimated protein needs.  Patient advanced to sips of clear liquids 3/27.  Dietetic intern observed lunch tray. Patient with 20% meal completion. Patient stated that she took sips of clear liquids but then stated to feel nauseated so she stopped. Patient stated that she did not have nausea last night, but she did have diarrhea. Patient was agreeable to try [employer] daily. Dietetic intern encouraged patient to drink sips throughout the day as able.   Height: Ht Readings from Last 1 Encounters:  08/10/13 _0  (1.575 m)    Weight Status:   Wt Readings from Last 1 Encounters:  08/10/13 162 lb 11.2 oz  (73.8 kg)    Re-estimated needs:  Kcal: 1650-1850  Protein: 90-110 gm  Fluid: 1.7-2 L  Skin: abdominal incision  Diet Order: Clear Liquid   Intake/Output Summary (Last 24 hours) at 08/23/13 1448 Last data filed at 08/23/13 1439  Gross per 24 hour  Intake    800 ml  Output   1200 ml  Net   -400 ml    Last BM: 3/26   Labs:   Recent Labs Lab 08/19/13 0500  08/21/13 0445 08/22/13 0545 08/23/13 0510  NA 141  < > 137 139 138  K 4.2  < > 4.2 4.3 4.7  CL 105  < > 100 101 101  CO2 23  < > _1 BUN 26*  < > 25* 29* 33*  CREATININE 0.74  < > 0.73 0.72 0.82  CALCIUM 8.5  < > 8.5 9.4 9.0  MG 2.3  --   --  2.5  --   PHOS 4.3  --   --  4.4  --   GLUCOSE 121*  < > 122* 150* 130*  < > = values in this interval not displayed.  CBG (last 3)   Recent Labs  08/23/13 0520 08/23/13 0724 08/23/13 1153  GLUCAP 127* 98 145*    Scheduled Meds: . antiseptic oral rinse  15 mL Mouth Rinse q12n4p  . bisacodyl  10 mg Rectal Daily  . chlorhexidine  15 mL Mouth Rinse BID  . FLUoxetine  20 mg Oral Daily  . gabapentin  200 mg Per Tube TID AC  . gabapentin  300 mg Per Tube QHS  . heparin  5,000 Units Subcutaneous 3 times per day  . levothyroxine  100 mcg Intravenous QAC breakfast  . metoprolol  2.5 mg Intravenous 4 times per day  . pantoprazole sodium  40 mg Per Tube QHS  . polyvinyl alcohol  1 drop Both Eyes BID  . topiramate  200 mg Oral BID  . vitamin E  800 Units Oral BID    Continuous Infusions: . sodium chloride 20 mL/hr (08/23/13 0514)  . Marland KitchenTPN (CLINIMIX-E) Adult 75 mL/hr at 08/22/13 1753   And  . fat emulsion 250 mL (08/22/13 1753)  . Marland KitchenTPN (CLINIMIX-E) Adult     And  . fat emulsion      Claudell Kyle, Dietetic Intern Pager: [phone]

## 2013-08-23 NOTE — Progress Notes (Signed)
Pt was unable to void but stated that she had the urge bladder scanned for 360 mls and foley was inserted will continue to monitor. Arthor Captain LPN

## 2013-08-23 NOTE — Progress Notes (Signed)
I agree with the Student-Dietitian note and made appropriate revisions.  Katie Dantrell Schertzer, RD, LDN Pager #: [phone] After-Hours Pager #: [phone]  

## 2013-08-23 NOTE — Progress Notes (Signed)
PARENTERAL NUTRITION CONSULT NOTE - FOLLOW UP  Pharmacy Consult for TPN Indication: intolerance to enteral feeding  Allergies  Allergen Reactions  . Celecoxib Other (See Comments)    unknown  . Glucosamine     unknown  . Metoclopramide Hcl Other (See Comments)    shaking  . Niacin Other (See Comments)    shaking  . 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 Phenazopyridine Hcl Other (See Comments)    unknown  . Sulfonamide Derivatives Nausea And Vomiting  . Trovan [Alatrofloxacin Mesylate] Other (See Comments)    Caused shaking and nervousness  . Benzocaine-Resorcinol Rash  . Sulfa Antibiotics Rash    Patient Measurements: Height: '5\' 2"'  (157.5 cm) Weight: 162 lb 11.2 oz (73.8 kg) IBW/kg (Calculated) : 50.1  Vital Signs: Temp: 98.7 F (37.1 C) (03/27 0526) Temp src: Oral (03/27 0526) BP: 117/63 mmHg (03/27 0557) Pulse Rate: 123 (03/27 0557) Intake/Output from previous day: 03/26 0701 - 03/27 0700 In: 1095.7 [P.O.:120; I.V.:975.7] Out: 1400 [Urine:1400] Intake/Output from this shift:    Labs:  Recent Labs  08/21/13 0445 08/22/13 0545 08/23/13 0510  WBC 11.9* 13.3* 12.6*  HGB 11.7* 13.4 12.7  HCT 35.1* 39.5 37.4  PLT 246 300 293     Recent Labs  08/21/13 0445 08/22/13 0545 08/23/13 0510  NA 137 139 138  K 4.2 4.3 4.7  CL 100 101 101  CO2 '22 22 22  ' GLUCOSE 122* 150* 130*  BUN 25* 29* 33*  CREATININE 0.73 0.72 0.82  CALCIUM 8.5 9.4 9.0  MG  --  2.5  --   PHOS  --  4.4  --   PROT 5.9* 7.3 6.7  ALBUMIN 2.7* 3.5 3.3*  AST 21 21 38*  ALT '19 22 27  ' ALKPHOS 145* 164* 155*  BILITOT <0.2* 0.3 0.3   Estimated Creatinine Clearance: 57.5 ml/min (by C-G formula based on Cr of 0.82).    Recent Labs  08/23/13 0021 08/23/13 0520 08/23/13 0724  GLUCAP 135* 127* 98   Insulin Requirements in the past 24 hours:  3 units SSI; CBGs and SSI stopped 3/27  Current Nutrition:  Clinimix E 5/15 at 57m/hr + 20% lipid  emulsion at 184mhr-provides 90 gm protein and 1758 kCal daily  Nutritional Goals:  1650-1850 kCal, 90-110 grams of protein per day per RD recs 3/19  Assessment:   [id number] with pancreatic cancer s/p Whipple procedure on 3/11. She has experienced significant post-operative nausea and vomiting. Diet advanced to clear liquids this am      GI: s/p Whipple procedure 3/11.  Has had difficulties with nausea and has been intolerant of PO intake.  NGT was discontinued on 3/25.  Upper GI 3/26 demonstrated patency of gastrojejunostomy but no good info about gastric emptying.  Diet advanced to clear liquids this am. Baseline prealbumin 18 (wnl)- now up to 23.4.  Endo: A1C 5.2 this admission. CBGs have been well controlled after surgery with SSI only. TSH was normal in January- she has been on IV Synthroid 10039m(home dose is 175m21mo). Will DC SSI and CBGs 3/27  Lytes: Na 138, K 4.7    Renal: SCr stable, CrCl ~60mL97m; UOP 0.8mL/k41mr  Pulm: RA  Cards: hx HTN and HLD as well as mitral valve prolapse; VSS. Receiving IV scheduled metoprolol- other home CV medications have not yet been resumed.   Hepatobil: alk phos elevated but is stable, albumin 3.3.  Other LFTs wnl. Baseline triglycerides elevated  at 33- now have decreased to 223  Neuro: hx anxiety and depression on gabapentin, topiramate and Prozac.  ID: WBC 12.6, afebrile. Cipro D#9, Flagyl D#8. No cultures  Best Practices: subQ heparin, IV PPI changed to per tube 3/27  Home Medications not yet resumed: atenolol, lovastatin, hydroxyzine, calcium + D, cyanocobalamin, aspirin  TPN Access: PICC placed 3/18  TPN day#: 10  (started 3/18)  Plan:  Continue Clinimix E 5/15 at 74m/hr + 20% lipid emulsion at 171mhr which will provide 90 g protein and 1758 kCal daily Daily IV multivitamin; trace elements on MWF only d/t national shortage Stop SSI and CBGs Change IV PPI to per tube Continue [facility]  CMET  MiEudelia BunchPharm.D. [id number]/27/2015 8:37 AM

## 2013-08-23 NOTE — Progress Notes (Signed)
Patient ID: [name], female   DOB: 09-Aug-1940, 73 y.o.   MRN: [id number] 16 Days Post-Op   Subjective: Pt denies nausea last night.  Was tachycardic last night.  Upper gi demonstrated patency of gastrojejunostomy, but no good information regarding amount of emptying.    Objective: Vital signs in last 24 hours: Temp:  [97.8 F (36.6 C)-98.8 F (37.1 C)] 98.7 F (37.1 C) (03/27 0526) Pulse Rate:  [108-130] 123 (03/27 0557) Resp:  [17-18] 17 (03/27 0526) BP: (95-155)/(63-108) 117/63 mmHg (03/27 0557) SpO2:  [95 %-100 %] 95 % (03/27 0526) Last BM Date: 08/22/13  Intake/Output from previous day: 03/26 0701 - 03/27 0700 In: 1095.7 [P.O.:120; I.V.:975.7] Out: 1400 [Urine:1400] Intake/Output this shift:    PE: Gen:  Alert, NAD, pleasant.  Looks nauseated. Card:  RRR Pulm:  Breathing comfortably Abd: Soft, non distended,  incisions C/D/I with steristrips, drains out.      Lab Results:   Recent Labs  08/22/13 0545 08/23/13 0510  WBC 13.3* 12.6*  HGB 13.4 12.7  HCT 39.5 37.4  PLT 300 293   BMET  Recent Labs  08/22/13 0545 08/23/13 0510  NA 139 138  K 4.3 4.7  CL 101 101  CO2 22 22  GLUCOSE 150* 130*  BUN 29* 33*  CREATININE 0.72 0.82  CALCIUM 9.4 9.0   PT/INR No results found for this basename: LABPROT, INR,  in the last 72 hours CMP     Component Value Date/Time   NA 138 08/23/2013 0510   K 4.7 08/23/2013 0510   CL 101 08/23/2013 0510   CO2 22 08/23/2013 0510   GLUCOSE 130* 08/23/2013 0510   BUN 33* 08/23/2013 0510   CREATININE 0.82 08/23/2013 0510   CREATININE 0.81 07/01/2013 1324   CALCIUM 9.0 08/23/2013 0510   PROT 6.7 08/23/2013 0510   ALBUMIN 3.3* 08/23/2013 0510   AST 38* 08/23/2013 0510   ALT 27 08/23/2013 0510   ALKPHOS 155* 08/23/2013 0510   BILITOT 0.3 08/23/2013 0510   GFRNONAA 69* 08/23/2013 0510   GFRAA 80* 08/23/2013 0510   Lipase  No results found for this basename: lipase       Studies/Results: Dg Ugi W/water Sol Cm  08/22/2013    CLINICAL DATA:  Gastric emptying  EXAM: WATER SOLUBLE UPPER GI SERIES  TECHNIQUE: Single-column upper GI series was performed using water soluble contrast.  : COMPARISON:  MR ABDOMEN WO/W CM dated 06/19/2013  FLUOROSCOPY TIME:  4 min, 15 seconds  FINDINGS: Initial KUB demonstrates biliary stent, postoperative findings in the abdomen, and lower lumbar spine fixation and posterior decompression. Probable elevated right hemidiaphragm.  The remaining stomach appears to over hanging the gastrojejunostomy, initially blocking visualization of the gastrojejunostomy on the frontal projection images. I turn the patient into both the right and left lateral decubitus positions in order to visualize the gastrojejunostomy along the posterior margin of the stomach. There is filling of the proximal jejunum via the gastrojejunostomy, indicating patency, although given the somewhat distended appearance of the remaining stomach in the dilute nature of the water-soluble contrast, we were not really able to maximally distend the gastrojejunostomy. I did perform gravity maneuvers to best right of fill the gastrojejunostomy although it filled best when the patient was nearly supine but in that position visualization was blocked by contrast in the stomach. I do not observe findings characteristic of leak in the vicinity of the gastrojejunostomy.  IMPRESSION: 1. Distended gastric residuum, with the gastrojejunostomy extending posteriorly, best visualized on the  lateral projection images. The gastrojejunostomy is patent, although it is difficult to assess how tight the anastomosis is - I had to turn the patient lateral to see the anastomosis, but that was not an ideal position to fill the anastomosis despite tilting the table up. A nuclear medicine gastric emptying study could be utilized for more quantitative assessment of how well the stomach is emptying. We did not demonstrate a leak on today's exam.   Electronically Signed   By: Sherryl Barters M.D.   On: 08/22/2013 15:39    Anti-infectives: Anti-infectives   Start     Dose/Rate Route Frequency Ordered Stop   08/16/13 1000  metroNIDAZOLE (FLAGYL) IVPB 500 mg  Status:  Discontinued     500 mg 100 mL/hr over 60 Minutes Intravenous Every 8 hours 08/16/13 0854 08/22/13 0957   08/15/13 0900  ciprofloxacin (CIPRO) IVPB 400 mg  Status:  Discontinued     400 mg 200 mL/hr over 60 Minutes Intravenous Every 12 hours 08/15/13 0752 08/22/13 0957   08/08/13 0800  ertapenem (INVANZ) 1 g in sodium chloride 0.9 % 50 mL IVPB     1 g 100 mL/hr over 30 Minutes Intravenous Every 24 hours 08/07/13 1559 08/08/13 0913   08/07/13 0600  ertapenem (INVANZ) 1 g in sodium chloride 0.9 % 50 mL IVPB     1 g 100 mL/hr over 30 Minutes Intravenous On call to O.R. 08/06/13 1431 08/07/13 0829       Assessment/Plan POD 16 s/p diagnostic laparoscopy and whipple procedure for pancreatic adenocarcinoma  Plan:  SCD's, mobilization  Foley for urinary retention, foley.    HTN - on metoprolol   Pulmonary toilet  Delayed gastric emptying.    Try sips of clears  .   Protein calorie malnutrition - moderate - continue TNA.     Hyperglycemia - SSI.   On home meds   Disp:  SNF vs home with son after d/c.        LOS: 16 days    [name] 08/23/2013, 7:37 AM

## 2013-08-23 NOTE — Telephone Encounter (Signed)
, °

## 2013-08-24 LAB — COMPREHENSIVE METABOLIC PANEL
ALK PHOS: 130 U/L — AB (ref 39–117)
ALT: 27 U/L (ref 0–35)
AST: 26 U/L (ref 0–37)
Albumin: 2.9 g/dL — ABNORMAL LOW (ref 3.5–5.2)
BILIRUBIN TOTAL: 0.3 mg/dL (ref 0.3–1.2)
BUN: 29 mg/dL — ABNORMAL HIGH (ref 6–23)
CHLORIDE: 102 meq/L (ref 96–112)
CO2: 23 mEq/L (ref 19–32)
Calcium: 8.3 mg/dL — ABNORMAL LOW (ref 8.4–10.5)
Creatinine, Ser: 0.8 mg/dL (ref 0.50–1.10)
GFR calc Af Amer: 83 mL/min — ABNORMAL LOW (ref >90)
GFR calc non Af Amer: 71 mL/min — ABNORMAL LOW (ref >90)
Glucose, Bld: 111 mg/dL — ABNORMAL HIGH (ref 70–99)
POTASSIUM: 4.1 meq/L (ref 3.7–5.3)
SODIUM: 140 meq/L (ref 137–147)
Total Protein: 6 g/dL (ref 6.0–8.3)

## 2013-08-24 LAB — CBC
HCT: 36.4 % (ref 36.0–46.0)
Hemoglobin: 12 g/dL (ref 12.0–15.0)
MCH: 32.1 pg (ref 26.0–34.0)
MCHC: 33 g/dL (ref 30.0–36.0)
MCV: 97.3 fL (ref 78.0–100.0)
Platelets: 255 10*3/uL (ref 150–400)
RBC: 3.74 MIL/uL — ABNORMAL LOW (ref 3.87–5.11)
RDW: 14.4 % (ref 11.5–15.5)
WBC: 13 10*3/uL — ABNORMAL HIGH (ref 4.0–10.5)

## 2013-08-24 LAB — GLUCOSE, CAPILLARY
GLUCOSE-CAPILLARY: 132 mg/dL — AB (ref 70–99)
Glucose-Capillary: 120 mg/dL — ABNORMAL HIGH (ref 70–99)

## 2013-08-24 MED ORDER — FAT EMULSION 20 % IV EMUL
250.0000 mL | INTRAVENOUS | Status: AC
  Administered 2013-08-24: 250 mL via INTRAVENOUS
  Filled 2013-08-24: qty 250

## 2013-08-24 MED ORDER — M.V.I. ADULT IV INJ
INTRAVENOUS | Status: AC
  Administered 2013-08-24: 18:00:00 via INTRAVENOUS
  Filled 2013-08-24: qty 2000

## 2013-08-24 NOTE — Progress Notes (Signed)
PARENTERAL NUTRITION CONSULT NOTE - FOLLOW UP  Pharmacy Consult for TPN Indication: intolerance to enteral feeding  Allergies  Allergen Reactions  . Celecoxib Other (See Comments)    unknown  . Glucosamine     unknown  . Metoclopramide Hcl Other (See Comments)    shaking  . Niacin Other (See Comments)    shaking  . 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 Phenazopyridine Hcl Other (See Comments)    unknown  . Sulfonamide Derivatives Nausea And Vomiting  . Trovan [Alatrofloxacin Mesylate] Other (See Comments)    Caused shaking and nervousness  . Benzocaine-Resorcinol Rash  . Sulfa Antibiotics Rash    Patient Measurements: Height: '5\' 2"'  (157.5 cm) Weight: 162 lb 11.2 oz (73.8 kg) IBW/kg (Calculated) : 50.1  Vital Signs: Temp: 98.1 F (36.7 C) (03/28 0607) Temp src: Oral (03/28 0607) BP: 154/64 mmHg (03/28 0607) Pulse Rate: 106 (03/28 0607) Intake/Output from previous day: 03/27 0701 - 03/28 0700 In: 2340 [P.O.:480; I.V.:160; TPN:1700] Out: 1375 [Urine:1375] Intake/Output from this shift:    Labs:  Recent Labs  08/22/13 0545 08/23/13 0510 08/24/13 0500  WBC 13.3* 12.6* 13.0*  HGB 13.4 12.7 12.0  HCT 39.5 37.4 36.4  PLT 300 293 255     Recent Labs  08/22/13 0545 08/23/13 0510 08/24/13 0500  NA 139 138 140  K 4.3 4.7 4.1  CL 101 101 102  CO2 '22 22 23  ' GLUCOSE 150* 130* 111*  BUN 29* 33* 29*  CREATININE 0.72 0.82 0.80  CALCIUM 9.4 9.0 8.3*  MG 2.5  --   --   PHOS 4.4  --   --   PROT 7.3 6.7 6.0  ALBUMIN 3.5 3.3* 2.9*  AST 21 38* 26  ALT '22 27 27  ' ALKPHOS 164* 155* 130*  BILITOT 0.3 0.3 0.3   Estimated Creatinine Clearance: 58.9 ml/min (by C-G formula based on Cr of 0.8).    Recent Labs  08/23/13 1734 08/23/13 2330 08/24/13 0605  GLUCAP 129* 117* 120*   Insulin Requirements in the past 24 hours:  3 units SSI; CBGs and SSI stopped 3/27  Current Nutrition:  Clinimix E 5/15 at 70m/hr + 20% lipid  emulsion at 154mhr-provides 90 gm protein and 1758 kCal daily  Nutritional Goals:  1650-1850 kCal, 90-110 grams of protein per day per RD recs 3/19  Assessment:   [id number] with pancreatic cancer s/p Whipple procedure on 3/11. She has experienced significant post-operative nausea and vomiting. Diet advanced to clear liquids this am      GI: s/p Whipple procedure 3/11.  Has had difficulties with nausea and has been intolerant of PO intake.  NGT was discontinued on 3/25.  Upper GI 3/26 demonstrated patency of gastrojejunostomy but no good info about gastric emptying.  Diet advanced to clear liquids 3/27 but still having difficulties with nausea. Baseline prealbumin 18 (wnl)- now up to 23.4.   Endo: A1C 5.2 this admission. CBGs have been well controlled after surgery.  No need to continue to monitor. TSH was normal in January- she has been on IV Synthroid 10052m(home dose is 175m36mo).  Lytes: Na 140, K 4.1    Renal: SCr stable, CrCl ~60mL107m; UOP 0.8mL/k67mr  Pulm: RA  Cards: hx HTN and HLD as well as mitral valve prolapse; VSS. Receiving IV scheduled metoprolol- other home CV medications have not yet been resumed.   Hepatobil: alk phos elevated but is stable, albumin 2.9.  Other  LFTs wnl. Baseline triglycerides elevated at 282- now have decreased to 223  Neuro: hx anxiety and depression on gabapentin, topiramate and Prozac.  ID: WBC 13, afebrile. Cipro/Flagyl stopped 3/26.  Best Practices: subQ heparin, IV PPI changed to PO 3/27  Home Medications not yet resumed: atenolol, lovastatin, hydroxyzine, calcium + D, cyanocobalamin, aspirin  TPN Access: PICC placed 3/18  TPN day#: 11 (started 3/18)  Plan:  Continue Clinimix E 5/15 at 42m/hr + 20% lipid emulsion at 144mhr which will provide 90 g protein and 1758 kCal daily Daily IV multivitamin; trace elements on MWF only d/t national shortage Continue PPI PO [facility]    MiLegrand ComoPharm.D., BCPS, AAHIVP Clinical Pharmacist Phone: [phone]/28/2015, 8:53 AM

## 2013-08-24 NOTE — Progress Notes (Signed)
17 Days Post-Op  Subjective: No n/v yesterday or this am. Tolerating clears. On TPN. No pain  Objective: Vital signs in last 24 hours: Temp:  [98.1 F (36.7 C)-98.5 F (36.9 C)] 98.1 F (36.7 C) (03/28 0607) Pulse Rate:  [106-114] 106 (03/28 0607) Resp:  [14-15] 15 (03/28 0607) BP: (120-154)/(64-88) 154/64 mmHg (03/28 0607) SpO2:  [93 %-96 %] 93 % (03/28 0607) Last BM Date: 08/23/13  Intake/Output from previous day: 03/27 0701 - 03/28 0700 In: 2340 [P.O.:480; I.V.:160; TPN:1700] Out: 1375 [Urine:1375] Intake/Output this shift:    Alert, nad cta b/l Reg  Soft, mild incisional TTP. Incision c/d/i. No signif distension No edema  Lab Results:   Recent Labs  08/23/13 0510 08/24/13 0500  WBC 12.6* 13.0*  HGB 12.7 12.0  HCT 37.4 36.4  PLT 293 255   BMET  Recent Labs  08/23/13 0510 08/24/13 0500  NA 138 140  K 4.7 4.1  CL 101 102  CO2 22 23  GLUCOSE 130* 111*  BUN 33* 29*  CREATININE 0.82 0.80  CALCIUM 9.0 8.3*   PT/INR No results found for this basename: LABPROT, INR,  in the last 72 hours ABG No results found for this basename: PHART, PCO2, PO2, HCO3,  in the last 72 hours  Studies/Results: Dg Ugi W/water Sol Cm  08/22/2013   CLINICAL DATA:  Gastric emptying  EXAM: WATER SOLUBLE UPPER GI SERIES  TECHNIQUE: Single-column upper GI series was performed using water soluble contrast.  : COMPARISON:  MR ABDOMEN WO/W CM dated 06/19/2013  FLUOROSCOPY TIME:  4 min, 15 seconds  FINDINGS: Initial KUB demonstrates biliary stent, postoperative findings in the abdomen, and lower lumbar spine fixation and posterior decompression. Probable elevated right hemidiaphragm.  The remaining stomach appears to over hanging the gastrojejunostomy, initially blocking visualization of the gastrojejunostomy on the frontal projection images. I turn the patient into both the right and left lateral decubitus positions in order to visualize the gastrojejunostomy along the posterior margin of  the stomach. There is filling of the proximal jejunum via the gastrojejunostomy, indicating patency, although given the somewhat distended appearance of the remaining stomach in the dilute nature of the water-soluble contrast, we were not really able to maximally distend the gastrojejunostomy. I did perform gravity maneuvers to best right of fill the gastrojejunostomy although it filled best when the patient was nearly supine but in that position visualization was blocked by contrast in the stomach. I do not observe findings characteristic of leak in the vicinity of the gastrojejunostomy.  IMPRESSION: 1. Distended gastric residuum, with the gastrojejunostomy extending posteriorly, best visualized on the lateral projection images. The gastrojejunostomy is patent, although it is difficult to assess how tight the anastomosis is - I had to turn the patient lateral to see the anastomosis, but that was not an ideal position to fill the anastomosis despite tilting the table up. A nuclear medicine gastric emptying study could be utilized for more quantitative assessment of how well the stomach is emptying. We did not demonstrate a leak on today's exam.   Electronically Signed   By: Sherryl Barters M.D.   On: 08/22/2013 15:39    Anti-infectives: Anti-infectives   Start     Dose/Rate Route Frequency Ordered Stop   08/16/13 1000  metroNIDAZOLE (FLAGYL) IVPB 500 mg  Status:  Discontinued     500 mg 100 mL/hr over 60 Minutes Intravenous Every 8 hours 08/16/13 0854 08/22/13 0957   08/15/13 0900  ciprofloxacin (CIPRO) IVPB 400 mg  Status:  Discontinued     400 mg 200 mL/hr over 60 Minutes Intravenous Every 12 hours 08/15/13 0752 08/22/13 0957   08/08/13 0800  ertapenem (INVANZ) 1 g in sodium chloride 0.9 % 50 mL IVPB     1 g 100 mL/hr over 30 Minutes Intravenous Every 24 hours 08/07/13 1559 08/08/13 0913   08/07/13 0600  ertapenem (INVANZ) 1 g in sodium chloride 0.9 % 50 mL IVPB     1 g 100 mL/hr over 30 Minutes  Intravenous On call to O.R. 08/06/13 1431 08/07/13 0829      Assessment/Plan: s/p Procedure(s): LAPAROSCOPY DIAGNOSTIC (N/A) WHIPPLE PROCEDURE (N/A)  POD 17 s/p diagnostic laparoscopy and whipple procedure for pancreatic adenocarcinoma  Plan:  SCD's, mobilization  Foley for urinary retention, foley. Will do another voiding trial sunday  HTN - on metoprolol  Pulmonary toilet  Delayed gastric emptying. Cont clears today. If tolerates today, will consider adv diet sunday .  Protein calorie malnutrition - moderate - continue TNA.  Hyperglycemia - SSI.  On home meds  Disp: SNF vs home with son after d/c.   Leighton Ruff. Redmond Pulling, MD, FACS General, Bariatric, & Minimally Invasive Surgery [facility], [place]   LOS: 17 days    Gayland Curry 08/24/2013

## 2013-08-25 LAB — CBC
HCT: 34.3 % — ABNORMAL LOW (ref 36.0–46.0)
Hemoglobin: 11.4 g/dL — ABNORMAL LOW (ref 12.0–15.0)
MCH: 32.3 pg (ref 26.0–34.0)
MCHC: 33.2 g/dL (ref 30.0–36.0)
MCV: 97.2 fL (ref 78.0–100.0)
Platelets: 224 10*3/uL (ref 150–400)
RBC: 3.53 MIL/uL — ABNORMAL LOW (ref 3.87–5.11)
RDW: 14.5 % (ref 11.5–15.5)
WBC: 8.4 10*3/uL (ref 4.0–10.5)

## 2013-08-25 LAB — COMPREHENSIVE METABOLIC PANEL
ALBUMIN: 2.7 g/dL — AB (ref 3.5–5.2)
ALT: 25 U/L (ref 0–35)
AST: 23 U/L (ref 0–37)
Alkaline Phosphatase: 118 U/L — ABNORMAL HIGH (ref 39–117)
BUN: 25 mg/dL — ABNORMAL HIGH (ref 6–23)
CALCIUM: 8.5 mg/dL (ref 8.4–10.5)
CO2: 25 meq/L (ref 19–32)
Chloride: 103 mEq/L (ref 96–112)
Creatinine, Ser: 0.71 mg/dL (ref 0.50–1.10)
GFR calc Af Amer: >90 mL/min (ref >90)
GFR, EST NON AFRICAN AMERICAN: 84 mL/min — AB (ref >90)
Glucose, Bld: 86 mg/dL (ref 70–99)
Potassium: 4.7 mEq/L (ref 3.7–5.3)
Sodium: 139 mEq/L (ref 137–147)
Total Bilirubin: 0.2 mg/dL — ABNORMAL LOW (ref 0.3–1.2)
Total Protein: 5.9 g/dL — ABNORMAL LOW (ref 6.0–8.3)

## 2013-08-25 MED ORDER — OXYCODONE-ACETAMINOPHEN 5-325 MG PO TABS
1.0000 | ORAL_TABLET | ORAL | Status: DC | PRN
  Administered 2013-08-25 – 2013-09-12 (×18): 1 via ORAL
  Filled 2013-08-25 (×19): qty 1

## 2013-08-25 MED ORDER — ONDANSETRON HCL 4 MG/2ML IJ SOLN
4.0000 mg | INTRAMUSCULAR | Status: DC | PRN
  Administered 2013-08-25 – 2013-09-03 (×15): 4 mg via INTRAVENOUS
  Filled 2013-08-25 (×16): qty 2

## 2013-08-25 MED ORDER — FAT EMULSION 20 % IV EMUL
250.0000 mL | INTRAVENOUS | Status: AC
  Administered 2013-08-25: 250 mL via INTRAVENOUS
  Filled 2013-08-25: qty 250

## 2013-08-25 MED ORDER — ONDANSETRON HCL 4 MG PO TABS
4.0000 mg | ORAL_TABLET | ORAL | Status: DC | PRN
  Administered 2013-08-26 – 2013-09-10 (×11): 4 mg via ORAL
  Filled 2013-08-25 (×11): qty 1

## 2013-08-25 MED ORDER — LORAZEPAM 0.5 MG PO TABS
0.5000 mg | ORAL_TABLET | Freq: Three times a day (TID) | ORAL | Status: DC | PRN
  Administered 2013-08-25 – 2013-09-12 (×17): 0.5 mg via ORAL
  Filled 2013-08-25 (×18): qty 1

## 2013-08-25 MED ORDER — M.V.I. ADULT IV INJ
INTRAVENOUS | Status: AC
  Administered 2013-08-25: 18:00:00 via INTRAVENOUS
  Filled 2013-08-25: qty 2000

## 2013-08-25 NOTE — Progress Notes (Signed)
PARENTERAL NUTRITION CONSULT NOTE - FOLLOW UP  Pharmacy Consult for TPN Indication: intolerance to enteral feeding  Allergies  Allergen Reactions  . Celecoxib Other (See Comments)    unknown  . Glucosamine     unknown  . Metoclopramide Hcl Other (See Comments)    shaking  . Niacin Other (See Comments)    shaking  . 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 Phenazopyridine Hcl Other (See Comments)    unknown  . Sulfonamide Derivatives Nausea And Vomiting  . Trovan [Alatrofloxacin Mesylate] Other (See Comments)    Caused shaking and nervousness  . Benzocaine-Resorcinol Rash  . Sulfa Antibiotics Rash    Patient Measurements: Height: '5\' 2"'  (157.5 cm) Weight: 162 lb 11.2 oz (73.8 kg) IBW/kg (Calculated) : 50.1  Vital Signs: Temp: 98.1 F (36.7 C) (03/29 5830) Temp src: Oral (03/29 0611) BP: 116/51 mmHg (03/29 0611) Pulse Rate: 93 (03/29 0611) Intake/Output from previous day: 03/28 0701 - 03/29 0700 In: 3455 [P.O.:700; I.V.:800; NMM:7680] Out: 1850 [Urine:1850] Intake/Output from this shift:    Labs:  Recent Labs  08/23/13 0510 08/24/13 0500 08/25/13 0500  WBC 12.6* 13.0* 8.4  HGB 12.7 12.0 11.4*  HCT 37.4 36.4 34.3*  PLT 293 255 224     Recent Labs  08/23/13 0510 08/24/13 0500 08/25/13 0500  NA 138 140 139  K 4.7 4.1 4.7  CL 101 102 103  CO2 '22 23 25  ' GLUCOSE 130* 111* 86  BUN 33* 29* 25*  CREATININE 0.82 0.80 0.71  CALCIUM 9.0 8.3* 8.5  PROT 6.7 6.0 5.9*  ALBUMIN 3.3* 2.9* 2.7*  AST 38* 26 23  ALT '27 27 25  ' ALKPHOS 155* 130* 118*  BILITOT 0.3 0.3 0.2*   Estimated Creatinine Clearance: 58.9 ml/min (by C-G formula based on Cr of 0.71).    Recent Labs  08/23/13 2330 08/24/13 0605 08/24/13 1201  GLUCAP 117* 120* 132*   Insulin Requirements in the past 24 hours:  CBGs and SSI stopped 3/27  Current Nutrition:  Clinimix E 5/15 at 33m/hr + 20% lipid emulsion at 123mhr-provides 90 gm protein and 1758  kCal daily  Nutritional Goals:  1650-1850 kCal, 90-110 grams of protein per day per RD recs 3/19  Assessment:   [name] with pancreatic cancer s/p Whipple procedure on 3/11. She has experienced significant post-operative nausea and vomiting. Diet advanced to clear liquids this am      GI: s/p Whipple procedure 3/11.  Has had difficulties with nausea and has been intolerant of PO intake.  NGT was discontinued on 3/25.  Upper GI 3/26 demonstrated patency of gastrojejunostomy but no good info about gastric emptying.  Diet advanced to clear liquids 3/27 but had difficulty with nausea.  Considering advancing diet on Sunday 3/29. Baseline prealbumin 18 (wnl)- now up to 23.4.   Endo: A1C 5.2 this admission. CBGs have been well controlled after surgery.  No need to continue to monitor. TSH was normal in January- she has been on IV Synthroid 10066m(home dose is 175m61mo).  Lytes: Na 139, K 4.7    Renal: SCr stable, CrCl ~60mL21m; UOP 1mL/k90mr  Pulm: RA  Cards: hx HTN and HLD as well as mitral valve prolapse; VSS. Receiving IV scheduled metoprolol- other home CV medications have not yet been resumed.   Hepatobil: alk phos elevated but is stable, albumin 2.7.  Other LFTs wnl. Baseline triglycerides elevated at 282- now have decreased to 223  Neuro: hx  anxiety and depression on gabapentin, topiramate and Prozac.  ID: WBC 8.4 afebrile. Cipro/Flagyl stopped 3/26.  Best Practices: subQ heparin, IV PPI changed to PO 3/27  Home Medications not yet resumed: atenolol, lovastatin, hydroxyzine, calcium + D, cyanocobalamin, aspirin  TPN Access: PICC placed 3/18  TPN day#: 12 (started 3/18)  Plan:  Continue Clinimix E 5/15 at 52m/hr + 20% lipid emulsion at 164mhr which will provide 90 g protein and 1758 kCal daily Daily IV multivitamin; trace elements on MWF only d/t national shortage Continue PPI PO Continue [facility] Follow-up ability to advance and tolerate  diet  MiLegrand ComoPharm.D., BCPS, AAHIVP Clinical Pharmacist Phone: [phone]/29/2015, 7:47 AM

## 2013-08-25 NOTE — Progress Notes (Signed)
18 Days Post-Op  Subjective: A little nausea and abd discomfort. +BMs. Sat up til 9pm last night. Tolerating entire tray of clears  Objective: Vital signs in last 24 hours: Temp:  [98.1 F (36.7 C)-98.2 F (36.8 C)] 98.1 F (36.7 C) (03/29 0611) Pulse Rate:  [93-103] 93 (03/29 0611) Resp:  [16-19] 16 (03/29 0611) BP: (116-133)/(51-67) 116/51 mmHg (03/29 0611) SpO2:  [96 %-97 %] 97 % (03/29 0611) Last BM Date: 08/25/13  Intake/Output from previous day: 03/28 0701 - 03/29 0700 In: 3455 [P.O.:700; I.V.:800; VWU:9811] Out: 1850 [Urine:1850] Intake/Output this shift: Total I/O In: 420 [P.O.:420] Out: -   Alert, nad cta b/l Reg Mildly protuberant, soft, mild TTP. Incision c/d/i No edema  Lab Results:   Recent Labs  08/24/13 0500 08/25/13 0500  WBC 13.0* 8.4  HGB 12.0 11.4*  HCT 36.4 34.3*  PLT 255 224   BMET  Recent Labs  08/24/13 0500 08/25/13 0500  NA 140 139  K 4.1 4.7  CL 102 103  CO2 23 25  GLUCOSE 111* 86  BUN 29* 25*  CREATININE 0.80 0.71  CALCIUM 8.3* 8.5   PT/INR No results found for this basename: LABPROT, INR,  in the last 72 hours ABG No results found for this basename: PHART, PCO2, PO2, HCO3,  in the last 72 hours  Studies/Results: No results found.  Anti-infectives: Anti-infectives   Start     Dose/Rate Route Frequency Ordered Stop   08/16/13 1000  metroNIDAZOLE (FLAGYL) IVPB 500 mg  Status:  Discontinued     500 mg 100 mL/hr over 60 Minutes Intravenous Every 8 hours 08/16/13 0854 08/22/13 0957   08/15/13 0900  ciprofloxacin (CIPRO) IVPB 400 mg  Status:  Discontinued     400 mg 200 mL/hr over 60 Minutes Intravenous Every 12 hours 08/15/13 0752 08/22/13 0957   08/08/13 0800  ertapenem (INVANZ) 1 g in sodium chloride 0.9 % 50 mL IVPB     1 g 100 mL/hr over 30 Minutes Intravenous Every 24 hours 08/07/13 1559 08/08/13 0913   08/07/13 0600  ertapenem (INVANZ) 1 g in sodium chloride 0.9 % 50 mL IVPB     1 g 100 mL/hr over 30 Minutes  Intravenous On call to O.R. 08/06/13 1431 08/07/13 0829      Assessment/Plan: s/p Procedure(s): LAPAROSCOPY DIAGNOSTIC (N/A) WHIPPLE PROCEDURE (N/A)  POD 18 s/p diagnostic laparoscopy and whipple procedure for pancreatic adenocarcinoma  Plan:  SCD's, mobilization  Foley for urinary retention. Will do another voiding trial today HTN - on metoprolol  Pulmonary toilet  Delayed gastric emptying. Since tolerated clears for >24hrs will adv to full liquids  Protein calorie malnutrition - moderate - continue TNA. If tolerates full liquids may start considering to wean TPN on Monday/tuesday Hyperglycemia - SSI.  On home meds  Disp: SNF vs home with son after d/c.   Leighton Ruff. Redmond Pulling, MD, FACS General, Bariatric, & Minimally Invasive Surgery [facility], [place]    LOS: 18 days    [name] 08/25/2013

## 2013-08-26 LAB — COMPREHENSIVE METABOLIC PANEL
ALT: 33 U/L (ref 0–35)
AST: 31 U/L (ref 0–37)
Albumin: 2.7 g/dL — ABNORMAL LOW (ref 3.5–5.2)
Alkaline Phosphatase: 130 U/L — ABNORMAL HIGH (ref 39–117)
BUN: 23 mg/dL (ref 6–23)
CALCIUM: 8.6 mg/dL (ref 8.4–10.5)
CO2: 24 mEq/L (ref 19–32)
CREATININE: 0.8 mg/dL (ref 0.50–1.10)
Chloride: 104 mEq/L (ref 96–112)
GFR, EST AFRICAN AMERICAN: 83 mL/min — AB (ref >90)
GFR, EST NON AFRICAN AMERICAN: 71 mL/min — AB (ref >90)
Glucose, Bld: 90 mg/dL (ref 70–99)
Potassium: 4.9 mEq/L (ref 3.7–5.3)
SODIUM: 140 meq/L (ref 137–147)
TOTAL PROTEIN: 5.8 g/dL — AB (ref 6.0–8.3)
Total Bilirubin: 0.2 mg/dL — ABNORMAL LOW (ref 0.3–1.2)

## 2013-08-26 LAB — DIFFERENTIAL
BASOS ABS: 0 10*3/uL (ref 0.0–0.1)
BASOS PCT: 1 % (ref 0–1)
Eosinophils Absolute: 0.5 10*3/uL (ref 0.0–0.7)
Eosinophils Relative: 5 % (ref 0–5)
Lymphocytes Relative: 28 % (ref 12–46)
Lymphs Abs: 2.4 10*3/uL (ref 0.7–4.0)
MONO ABS: 0.5 10*3/uL (ref 0.1–1.0)
Monocytes Relative: 6 % (ref 3–12)
NEUTROS ABS: 5.2 10*3/uL (ref 1.7–7.7)
NEUTROS PCT: 61 % (ref 43–77)

## 2013-08-26 LAB — CBC
HEMATOCRIT: 32.9 % — AB (ref 36.0–46.0)
HEMOGLOBIN: 10.7 g/dL — AB (ref 12.0–15.0)
MCH: 31.8 pg (ref 26.0–34.0)
MCHC: 32.5 g/dL (ref 30.0–36.0)
MCV: 97.9 fL (ref 78.0–100.0)
Platelets: 209 10*3/uL (ref 150–400)
RBC: 3.36 MIL/uL — AB (ref 3.87–5.11)
RDW: 14.6 % (ref 11.5–15.5)
WBC: 8.6 10*3/uL (ref 4.0–10.5)

## 2013-08-26 LAB — TRIGLYCERIDES: TRIGLYCERIDES: 384 mg/dL — AB (ref <150)

## 2013-08-26 LAB — PHOSPHORUS: Phosphorus: 4.6 mg/dL (ref 2.3–4.6)

## 2013-08-26 LAB — MAGNESIUM: MAGNESIUM: 2.4 mg/dL (ref 1.5–2.5)

## 2013-08-26 LAB — PREALBUMIN: Prealbumin: 27.3 mg/dL (ref 17.0–34.0)

## 2013-08-26 MED ORDER — ADULT MULTIVITAMIN W/MINERALS CH
1.0000 | ORAL_TABLET | Freq: Every day | ORAL | Status: DC
  Administered 2013-08-27 – 2013-09-12 (×15): 1 via ORAL
  Filled 2013-08-26 (×21): qty 1

## 2013-08-26 MED ORDER — ADULT MULTIVITAMIN W/MINERALS CH: 1.0000 | ORAL_TABLET | Freq: Every day | ORAL | Status: DC

## 2013-08-26 MED ORDER — CLINIMIX E/DEXTROSE (5/15) 5 % IV SOLN
INTRAVENOUS | Status: AC
  Administered 2013-08-26: 17:00:00 via INTRAVENOUS
  Filled 2013-08-26: qty 2000

## 2013-08-26 NOTE — Progress Notes (Signed)
Physical Therapy Treatment Patient Details Name: BESAN KETCHEM MRN: [id number] DOB: 1940-12-11 Today's Date: 08/26/2013    History of Present Illness admitted with adenocarcinoma of the pancreatic head, s/p whipple procedure, stenting and diagnostic lap.    PT Comments    Patient progressing with ambulation and endurance this session. Awaiting SNF at this time. Continue with current POC  Follow Up Recommendations  SNF;Supervision for mobility/OOB     Equipment Recommendations  None recommended by PT    Recommendations for Other Services       Precautions / Restrictions Precautions Precautions: Fall Precaution Comments: adominal wounds;     Mobility  Bed Mobility                  Transfers Overall transfer level: Needs assistance Equipment used: Rolling walker (2 wheeled)   Sit to Stand: Supervision         General transfer comment: Cues for safe technique  Ambulation/Gait Ambulation/Gait assistance: Supervision Ambulation Distance (Feet): 200 Feet Assistive device: Rolling walker (2 wheeled) Gait Pattern/deviations: Decreased stride length;Step-through pattern Gait velocity: decr   General Gait Details: Cues to stand upright and tall. Better endurance this session   Stairs            Wheelchair Mobility    Modified Rankin (Stroke Patients Only)       Balance                                    Cognition Arousal/Alertness: Awake/alert Behavior During Therapy: WFL for tasks assessed/performed Overall Cognitive Status: Within Functional Limits for tasks assessed                      Exercises      General Comments        Pertinent Vitals/Pain no apparent distress     Home Living                      Prior Function            PT Goals (current goals can now be found in the care plan section) Progress towards PT goals: Progressing toward goals    Frequency  Min 3X/week    PT Plan  Current plan remains appropriate    End of Session Equipment Utilized During Treatment: Gait belt Activity Tolerance: Patient tolerated treatment well Patient left: in chair;with call bell/phone within reach     Time: 1100-1127 PT Time Calculation (min): 27 min  Charges:  $Gait Training: 23-37 mins                    G Codes:      Jacqualyn Posey 08/26/2013, 1:49 PM  08/26/2013 Jacqualyn Posey PTA [phone] pager [phone] office

## 2013-08-26 NOTE — Progress Notes (Signed)
19 Days Post-Op  Subjective: Still with intermittent trouble voiding and nausea  Objective: Vital signs in last 24 hours: Temp:  [97.9 F (36.6 C)-98.2 F (36.8 C)] 98 F (36.7 C) (03/30 0504) Pulse Rate:  [93-100] 100 (03/30 0504) Resp:  [16] 16 (03/30 0504) BP: (120-127)/(53-58) 121/53 mmHg (03/30 0504) SpO2:  [95 %-97 %] 96 % (03/30 0504) Last BM Date: 08/25/13  Intake/Output from previous day: 03/29 0701 - 03/30 0700 In: 2115 [P.O.:840; JEH:6314] Out: 2350 [Urine:2350] Intake/Output this shift:    Abdomen soft, incision well healed, non distended Lungs clear   Lab Results:   Recent Labs  08/25/13 0500 08/26/13 0500  WBC 8.4 8.6  HGB 11.4* 10.7*  HCT 34.3* 32.9*  PLT 224 209   BMET  Recent Labs  08/25/13 0500 08/26/13 0500  NA 139 140  K 4.7 4.9  CL 103 104  CO2 25 24  GLUCOSE 86 90  BUN 25* 23  CREATININE 0.71 0.80  CALCIUM 8.5 8.6   PT/INR No results found for this basename: LABPROT, INR,  in the last 72 hours ABG No results found for this basename: PHART, PCO2, PO2, HCO3,  in the last 72 hours  Studies/Results: No results found.  Anti-infectives: Anti-infectives   Start     Dose/Rate Route Frequency Ordered Stop   08/16/13 1000  metroNIDAZOLE (FLAGYL) IVPB 500 mg  Status:  Discontinued     500 mg 100 mL/hr over 60 Minutes Intravenous Every 8 hours 08/16/13 0854 08/22/13 0957   08/15/13 0900  ciprofloxacin (CIPRO) IVPB 400 mg  Status:  Discontinued     400 mg 200 mL/hr over 60 Minutes Intravenous Every 12 hours 08/15/13 0752 08/22/13 0957   08/08/13 0800  ertapenem (INVANZ) 1 g in sodium chloride 0.9 % 50 mL IVPB     1 g 100 mL/hr over 30 Minutes Intravenous Every 24 hours 08/07/13 1559 08/08/13 0913   08/07/13 0600  ertapenem (INVANZ) 1 g in sodium chloride 0.9 % 50 mL IVPB     1 g 100 mL/hr over 30 Minutes Intravenous On call to O.R. 08/06/13 1431 08/07/13 0829      Assessment/Plan: s/p Procedure(s): LAPAROSCOPY DIAGNOSTIC  (N/A) WHIPPLE PROCEDURE (N/A)  Delayed gastric emptying   She wants to try full liquids.   Continue PT Continue TNA  dispo plans still pending given nausea  LOS: 19 days    Rozalynn Buege A 08/26/2013

## 2013-08-26 NOTE — Progress Notes (Signed)
PARENTERAL NUTRITION CONSULT NOTE - FOLLOW UP  Pharmacy Consult for TPN Indication: intolerance to enteral feeding  Allergies  Allergen Reactions  . Celecoxib Other (See Comments)    unknown  . Glucosamine     unknown  . Metoclopramide Hcl Other (See Comments)    shaking  . Niacin Other (See Comments)    shaking  . 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 Phenazopyridine Hcl Other (See Comments)    unknown  . Sulfonamide Derivatives Nausea And Vomiting  . Trovan [Alatrofloxacin Mesylate] Other (See Comments)    Caused shaking and nervousness  . Benzocaine-Resorcinol Rash  . Sulfa Antibiotics Rash    Patient Measurements: Height: _0  (157.5 cm) Weight: 162 lb 11.2 oz (73.8 kg) IBW/kg (Calculated) : 50.1  Vital Signs: Temp: 98 F (36.7 C) (03/30 0504) Temp src: Oral (03/30 0504) BP: 121/53 mmHg (03/30 0504) Pulse Rate: 100 (03/30 0504) Intake/Output from previous day: 03/29 0701 - 03/30 0700 In: 2115 [P.O.:840; TPN:1275] Out: 2350 [Urine:2350] Intake/Output from this shift: Total I/O In: -  Out: 650 [Urine:650]  Labs:  Recent Labs  08/24/13 0500 08/25/13 0500 08/26/13 0500  WBC 13.0* 8.4 8.6  HGB 12.0 11.4* 10.7*  HCT 36.4 34.3* 32.9*  PLT 255 224 209     Recent Labs  08/24/13 0500 08/25/13 0500 08/26/13 0500  NA 140 139 140  K 4.1 4.7 4.9  CL 102 103 104  CO2 _1 GLUCOSE 111* 86 90  BUN 29* 25* 23  CREATININE 0.80 0.71 0.80  CALCIUM 8.3* 8.5 8.6  MG  --   --  2.4  PHOS  --   --  4.6  PROT 6.0 5.9* 5.8*  ALBUMIN 2.9* 2.7* 2.7*  AST _2 ALT 27 25 33  ALKPHOS 130* 118* 130*  BILITOT 0.3 0.2* 0.2*  TRIG  --   --  384*   Estimated Creatinine Clearance: 58.9 ml/min (by C-G formula based on Cr of 0.8).   Insulin Requirements in the past 24 hours:  CBGs and SSI stopped 3/27  Current Nutrition:  Clinimix E 5/15 at 73m/hr + 20% lipid emulsion at 181mhr-provides 90 gm protein and 1758  kCal daily Full liquid diet 3/30  Nutritional Goals:  1650-1850 kCal, 90-110 grams of protein per day per RD recs 3/19  Assessment:   [id number] with pancreatic cancer s/p Whipple procedure on 3/11. She has experienced significant post-operative nausea and vomiting. Diet advanced to full liquids today per pt request    GI: s/p Whipple procedure 3/11.  Has had difficulties with nausea and has been intolerant of PO intake.  NGT was discontinued on 3/25.  Upper GI 3/26 demonstrated patency of gastrojejunostomy but no good info about gastric emptying. Poor po intake due to nausea. 3/30 Pt requested full liquid diet. Prealbumin trending up to 23.4.   Endo: A1C 5.2 this admission. CBGs well controlled after surgery so SSI/CBGs d/c. TSH was normal in January- she has been on IV Synthroid 10063m(home dose is 175m14mo).  Lytes: Lytes wnl.  Renal: SCr stable, CrCl ~60mL19m; UOP 1.3 mL/kg/hr  Pulm: RA  Cards: hx HTN and HLD as well as mitral valve prolapse; VSS. Receiving IV scheduled metoprolol- other home CV medications have not yet been resumed.   Hepatobil: alk phos elevated but stable, albumin 2.7.  Other LFTs wnl. Baseline triglycerides elevated at 282- now up to 384.  Neuro: hx anxiety and  depression on gabapentin, topiramate and Prozac.  ID: WBC wnl. Afebrile. Cipro/Flagyl stopped 3/26.  Best Practices: subQ heparin, IV PPI changed to PO 3/27  Home Medications not yet resumed: atenolol, lovastatin, hydroxyzine, calcium + D, cyanocobalamin, aspirin  TPN Access: PICC placed 3/18  TPN day#: 13 (started 3/18)  Plan:  1) Increase Clinimix E 5/15 slightly to 45m/hr + 20% lipid emulsion at 157mhr on M/W/F (starting Wed 4/1) which will provide daily average of 100 g protein and 1620 kCal. TPN + po full liquids likely will meet 100% estimated needs 2) Will add po MVI (d/c MVI and trace elements from TPN - on shortage). If pt does not tolerate, will change back to IV. 3) F/u prealbumin, TPN  labs 4) Follow-up po intake and ability to wean TPN  CaSherlon HandingPharmD, BCPS Clinical pharmacist, pager [phone]/30/2015, 10:00 AM

## 2013-08-27 ENCOUNTER — Telehealth: Payer: Self-pay | Admitting: Oncology

## 2013-08-27 LAB — COMPREHENSIVE METABOLIC PANEL
ALBUMIN: 2.6 g/dL — AB (ref 3.5–5.2)
ALT: 30 U/L (ref 0–35)
AST: 25 U/L (ref 0–37)
Alkaline Phosphatase: 133 U/L — ABNORMAL HIGH (ref 39–117)
BUN: 25 mg/dL — AB (ref 6–23)
CO2: 23 mEq/L (ref 19–32)
CREATININE: 0.73 mg/dL (ref 0.50–1.10)
Calcium: 8.5 mg/dL (ref 8.4–10.5)
Chloride: 105 mEq/L (ref 96–112)
GFR calc Af Amer: >90 mL/min (ref >90)
GFR calc non Af Amer: 83 mL/min — ABNORMAL LOW (ref >90)
Glucose, Bld: 120 mg/dL — ABNORMAL HIGH (ref 70–99)
Potassium: 4.4 mEq/L (ref 3.7–5.3)
Sodium: 140 mEq/L (ref 137–147)
TOTAL PROTEIN: 5.7 g/dL — AB (ref 6.0–8.3)
Total Bilirubin: 0.3 mg/dL (ref 0.3–1.2)

## 2013-08-27 LAB — CBC
HCT: 34.2 % — ABNORMAL LOW (ref 36.0–46.0)
Hemoglobin: 11 g/dL — ABNORMAL LOW (ref 12.0–15.0)
MCH: 32.1 pg (ref 26.0–34.0)
MCHC: 32.2 g/dL (ref 30.0–36.0)
MCV: 99.7 fL (ref 78.0–100.0)
PLATELETS: 187 10*3/uL (ref 150–400)
RBC: 3.43 MIL/uL — ABNORMAL LOW (ref 3.87–5.11)
RDW: 14.3 % (ref 11.5–15.5)
WBC: 8.2 10*3/uL (ref 4.0–10.5)

## 2013-08-27 MED ORDER — CLINIMIX E/DEXTROSE (5/15) 5 % IV SOLN
INTRAVENOUS | Status: AC
  Administered 2013-08-27: 18:00:00 via INTRAVENOUS
  Filled 2013-08-27: qty 2000

## 2013-08-27 NOTE — Progress Notes (Signed)
Seen and agreed 08/27/2013 Robinette, Julia Elizabeth PTA [phone] pager [phone] office    

## 2013-08-27 NOTE — Telephone Encounter (Signed)
, °

## 2013-08-27 NOTE — Progress Notes (Signed)
20 Days Post-Op  Subjective: She reports a little nausea this morning, but overall it is better after going on full liquids  Objective: Vital signs in last 24 hours: Temp:  [97.4 F (36.3 C)-98.2 F (36.8 C)] 98 F (36.7 C) (03/31 0539) Pulse Rate:  [104-123] 104 (03/31 0539) Resp:  [20] 20 (03/31 0539) BP: (120-150)/(54-75) 120/54 mmHg (03/31 0539) SpO2:  [94 %-98 %] 97 % (03/31 0539) Last BM Date: 08/26/13  Intake/Output from previous day: 03/30 0701 - 03/31 0700 In: 2244.7 [I.V.:460.7; DGU:4403] Out: 3825 [Urine:3825] Intake/Output this shift: Total I/O In: 2244.7 [I.V.:460.7; TPN:1784] Out: 2225 [Urine:2225]  Abdomen soft, minimally tender, non distended  Lab Results:   Recent Labs  08/26/13 0500 08/27/13 0445  WBC 8.6 8.2  HGB 10.7* 11.0*  HCT 32.9* 34.2*  PLT 209 187   BMET  Recent Labs  08/26/13 0500 08/27/13 0445  NA 140 140  K 4.9 4.4  CL 104 105  CO2 24 23  GLUCOSE 90 120*  BUN 23 25*  CREATININE 0.80 0.73  CALCIUM 8.6 8.5   PT/INR No results found for this basename: LABPROT, INR,  in the last 72 hours ABG No results found for this basename: PHART, PCO2, PO2, HCO3,  in the last 72 hours  Studies/Results: No results found.  Anti-infectives: Anti-infectives   Start     Dose/Rate Route Frequency Ordered Stop   08/16/13 1000  metroNIDAZOLE (FLAGYL) IVPB 500 mg  Status:  Discontinued     500 mg 100 mL/hr over 60 Minutes Intravenous Every 8 hours 08/16/13 0854 08/22/13 0957   08/15/13 0900  ciprofloxacin (CIPRO) IVPB 400 mg  Status:  Discontinued     400 mg 200 mL/hr over 60 Minutes Intravenous Every 12 hours 08/15/13 0752 08/22/13 0957   08/08/13 0800  ertapenem (INVANZ) 1 g in sodium chloride 0.9 % 50 mL IVPB     1 g 100 mL/hr over 30 Minutes Intravenous Every 24 hours 08/07/13 1559 08/08/13 0913   08/07/13 0600  ertapenem (INVANZ) 1 g in sodium chloride 0.9 % 50 mL IVPB     1 g 100 mL/hr over 30 Minutes Intravenous On call to O.R.  08/06/13 1431 08/07/13 0829      Assessment/Plan: s/p Procedure(s): LAPAROSCOPY DIAGNOSTIC (N/A) WHIPPLE PROCEDURE (N/A)  Malnutrition -- continue TNA.  Continue full liquids for now Disposition -- PT seeing patient and still recommending SNF.  She would rather go home.  She is not ready for either yet.  She says she will try to work harder with PT  LOS: 20 days    [name] A 08/27/2013

## 2013-08-27 NOTE — Progress Notes (Signed)
PARENTERAL NUTRITION CONSULT NOTE - FOLLOW UP  Pharmacy Consult for TPN Indication: intolerance to enteral feeding  Allergies  Allergen Reactions  . Celecoxib Other (See Comments)    unknown  . Glucosamine     unknown  . Metoclopramide Hcl Other (See Comments)    shaking  . Niacin Other (See Comments)    shaking  . 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 Phenazopyridine Hcl Other (See Comments)    unknown  . Sulfonamide Derivatives Nausea And Vomiting  . Trovan [Alatrofloxacin Mesylate] Other (See Comments)    Caused shaking and nervousness  . Benzocaine-Resorcinol Rash  . Sulfa Antibiotics Rash    Patient Measurements: Height: '5\' 2"'  (157.5 cm) Weight: 162 lb 11.2 oz (73.8 kg) IBW/kg (Calculated) : 50.1  Vital Signs: Temp: 98 F (36.7 C) (03/31 0539) Temp src: Oral (03/31 0539) BP: 120/54 mmHg (03/31 0539) Pulse Rate: 104 (03/31 0539) Intake/Output from previous day: 03/30 0701 - 03/31 0700 In: 2244.7 [I.V.:460.7; TPN:1784] Out: 3825 [Urine:3825] Intake/Output from this shift:    Labs:  Recent Labs  08/25/13 0500 08/26/13 0500 08/27/13 0445  WBC 8.4 8.6 8.2  HGB 11.4* 10.7* 11.0*  HCT 34.3* 32.9* 34.2*  PLT 224 209 187     Recent Labs  08/25/13 0500 08/26/13 0500 08/27/13 0445  NA 139 140 140  K 4.7 4.9 4.4  CL 103 104 105  CO2 '25 24 23  ' GLUCOSE 86 90 120*  BUN 25* 23 25*  CREATININE 0.71 0.80 0.73  CALCIUM 8.5 8.6 8.5  MG  --  2.4  --   PHOS  --  4.6  --   PROT 5.9* 5.8* 5.7*  ALBUMIN 2.7* 2.7* 2.6*  AST '23 31 25  ' ALT 25 33 30  ALKPHOS 118* 130* 133*  BILITOT 0.2* 0.2* 0.3  PREALBUMIN  --  27.3  --   TRIG  --  384*  --    Estimated Creatinine Clearance: 58.9 ml/min (by C-G formula based on Cr of 0.73).   Insulin Requirements in the past 24 hours:  CBGs and SSI stopped 3/27  Current Nutrition:  Clinimix E 5/15 at 98m/hr + 20% lipid emulsion at 184mhr-provides 90 gm protein and 1758 kCal  daily Full liquid diet   Nutritional Goals:  1650-1850 kCal, 90-110 grams of protein per day per RD recs 3/19  Assessment:   6379OF with pancreatic cancer s/p Whipple procedure on 3/11. She has experienced significant post-operative nausea and vomiting. Diet advanced to full liquids 3/30 and nausea seems to be somewhat better.    GI: s/p Whipple procedure 3/11.  Has had difficulties with nausea and has been intolerant of PO intake.  NGT d/c 3/25.  Upper GI 3/26 demonstrated patency of gastrojejunostomy but no good info about gastric emptying. Poor po intake due to nausea but somewhat better on full liquids. Prealbumin trending up to 27.3.   Endo: A1C 5.2 this admission. CBGs well controlled after surgery so SSI/CBGs d/c. TSH was normal in January- she has been on IV Synthroid 10082m(home dose is 175m24mo).  Lytes: Lytes wnl.  Renal: SCr stable, CrCl ~60mL97m; UOP 2.2 mL/kg/hr  Pulm: RA  Cards: hx HTN and HLD as well as mitral valve prolapse; BP elevated, HR 104-123. Receiving IV scheduled metoprolol- other home CV medications have not yet been resumed.   Hepatobil: alk phos elevated but stable, albumin 2.7.  Other LFTs wnl. Baseline triglycerides elevated at 282- now  up to 384.  Neuro: hx anxiety and depression on gabapentin, topiramate and Prozac.  ID: WBC wnl. Afebrile. Cipro/Flagyl stopped 3/26.  Best Practices: SQ heparin, IV PPI changed to PO 3/27  Home Medications not yet resumed: atenolol, lovastatin, hydroxyzine, calcium + D, cyanocobalamin, aspirin  TPN Access: PICC placed 3/18  TPN day#: 14 (started 3/18)  Plan:  1) Continue Clinimix E 5/15 at 71m/hr + 20% lipid emulsion at 113mhr on M/W/F (starting Wed 4/1) which will provide daily average of 100 g protein and 1620 kCal. TPN + po full liquids likely will meet 100% estimated needs 2) Pt on po MVI  3) TPN labs 4) Follow-up po intake and ability to wean TPN  CaSherlon HandingPharmD, BCPS Clinical pharmacist, pager  [phone]/31/2015, 8:26 AM

## 2013-08-27 NOTE — Progress Notes (Signed)
Physical Therapy Treatment Patient Details Name: [name] MRN: [id number] DOB: Aug 27, 1940 Today's Date: 08/27/2013    History of Present Illness admitted with adenocarcinoma of the pancreatic head, s/p whipple procedure, stenting and diagnostic lap.    PT Comments    Pt. Reports fatigue after taking pain medicine but is eager to perform gait training. Pt. c/o RLQ pain. Continue to recommend SNF unless able to provide assistance at home.   Follow Up Recommendations        Equipment Recommendations       Recommendations for Other Services       Precautions / Restrictions Precautions Precautions: Fall Precaution Comments: adominal wounds;  Restrictions Weight Bearing Restrictions: No    Mobility  Bed Mobility Overal bed mobility: Modified Independent                Transfers Overall transfer level: Needs assistance Equipment used: Rolling walker (2 wheeled) Transfers: Sit to/from Stand Sit to Stand: Min guard Stand pivot transfers: Min guard       General transfer comment: Cues for safe technique  Ambulation/Gait Ambulation/Gait assistance: Min guard Ambulation Distance (Feet): 150 Feet Assistive device: Rolling walker (2 wheeled) Gait Pattern/deviations: Decreased stride length;Step-through pattern Gait velocity: decr   General Gait Details: Pt. was fatigue and reports just taking pain medicine.    Stairs            Wheelchair Mobility    Modified Rankin (Stroke Patients Only)       Balance Overall balance assessment: Needs assistance Sitting-balance support: Feet supported Sitting balance-Leahy Scale: Fair     Standing balance support: Bilateral upper extremity supported Standing balance-Leahy Scale: Poor                      Cognition Arousal/Alertness: Awake/alert Behavior During Therapy: WFL for tasks assessed/performed Overall Cognitive Status: Within Functional Limits for tasks assessed Area of Impairment:  Memory   Current Attention Level: Sustained                Exercises      General Comments        Pertinent Vitals/Pain Pt. Stated pain in RLQ, unrated. Pt. Was premedicated presession    Home Living                      Prior Function            PT Goals (current goals can now be found in the care plan section) Acute Rehab PT Goals Patient Stated Goal: not stated Progress towards PT goals: Progressing toward goals    Frequency       PT Plan Current plan remains appropriate    End of Session Equipment Utilized During Treatment: Gait belt Activity Tolerance: Patient limited by fatigue Patient left: in bed;with call bell/phone within reach     Time: 1425-1445 PT Time Calculation (min): 20 min  Charges:  $Gait Training: 8-22 mins                    G Codes:      Harper Smoker, SPTA 08/27/2013, 2:53 PM

## 2013-08-27 NOTE — Progress Notes (Signed)
Occupational Therapy Treatment Patient Details Name: [name] MRN: [id number] DOB: 25-Jul-1940 Today's Date: 08/27/2013    History of present illness admitted with adenocarcinoma of the pancreatic head, s/p whipple procedure, stenting and diagnostic lap.   OT comments  Pt progressing towards OT goals. Completed sit>stand toilet transfer min guard. BADLs standing at sink min guard. Ambulated in room min guard. Education on walker safety including hand placement during transfers and to keep walker close to self.   Follow Up Recommendations  SNF;Supervision/Assistance - 24 hour    Equipment Recommendations  3 in 1 bedside comode    Recommendations for Other Services      Precautions / Restrictions Precautions Precautions: Fall Precaution Comments: adominal wounds;  Restrictions Weight Bearing Restrictions: No       Mobility Bed Mobility                  Transfers Overall transfer level: Needs assistance Equipment used: Rolling walker (2 wheeled) Transfers: Sit to/from [employer] Transfers Sit to Stand: Min guard Stand pivot transfers: Min guard       General transfer comment: Cues for safe technique    Balance Overall balance assessment: Needs assistance Sitting-balance support: Feet supported Sitting balance-Leahy Scale: Fair     Standing balance support: Bilateral upper extremity supported Standing balance-Leahy Scale: Poor                     ADL   Grooming: Wash/dry hands;Wash/dry face;Brushing hair;Supervision/safety;Standing         Toilet Transfer: Min guard;Comfort height toilet;RW;Grab bars Writer and Hygiene: Supervision/safety;Sitting/lateral lean   Functional mobility during ADLs: Min guard;Rolling walker General ADL Comments: Pt on toilet at start of session. Performed toilet hygenie in sitting with lateral leans. Pt education on not having both hands on walker during sit<>stand transfers. Pt  completed BADLs standing at sink min guard. Pt ambulated in room min guard.       Vision                     Perception     Praxis      Cognition   Behavior During Therapy: WFL for tasks assessed/performed Overall Cognitive Status: Within Functional Limits for tasks assessed Area of Impairment: Memory   Current Attention Level: Sustained                 Extremity/Trunk Assessment               Exercises       General Comments      Pertinent Vitals/ Pain       C/o nausea, fatigue  Home Living                                          Prior Functioning/Environment              Frequency Min 2X/week     Progress Toward Goals  OT Goals(current goals can now be found in the care plan section)  Progress towards OT goals: Progressing toward goals  Acute Rehab OT Goals Patient Stated Goal: not stated OT Goal Formulation: With patient Time For Goal Achievement: 09/02/13 Potential to Achieve Goals: Good ADL Goals Pt Will Perform Grooming: with set-up;with supervision;standing Pt Will Perform Lower Body Dressing: with set-up;with supervision;sit to/from stand Pt Will Transfer to Toilet: with supervision;ambulating;grab bars  Pt Will Perform Toileting - Clothing Manipulation and hygiene: with min assist;sit to/from stand Additional ADL Goal #1: Pt will be min guard A in and OOB for BADLs  Plan Discharge plan remains appropriate    End of Session Equipment Utilized During Treatment: Gait belt;Rolling walker  Activity Tolerance Patient limited by fatigue   Patient Left in chair;with call bell/phone within reach   Nurse Communication          Time: 1155-1209 OT Time Calculation (min): 14 min  Charges: OT General Charges $OT Visit: 1 Procedure OT Treatments $Self Care/Home Management : 8-22 mins  Tyrone Schimke OTR/L Pager: [phone]  08/27/2013, 1:38 PM

## 2013-08-28 LAB — CBC
HCT: 35.4 % — ABNORMAL LOW (ref 36.0–46.0)
HEMOGLOBIN: 11.6 g/dL — AB (ref 12.0–15.0)
MCH: 32.5 pg (ref 26.0–34.0)
MCHC: 32.8 g/dL (ref 30.0–36.0)
MCV: 99.2 fL (ref 78.0–100.0)
Platelets: 192 10*3/uL (ref 150–400)
RBC: 3.57 MIL/uL — AB (ref 3.87–5.11)
RDW: 14.4 % (ref 11.5–15.5)
WBC: 7.8 10*3/uL (ref 4.0–10.5)

## 2013-08-28 LAB — COMPREHENSIVE METABOLIC PANEL
ALT: 31 U/L (ref 0–35)
AST: 28 U/L (ref 0–37)
Albumin: 3 g/dL — ABNORMAL LOW (ref 3.5–5.2)
Alkaline Phosphatase: 138 U/L — ABNORMAL HIGH (ref 39–117)
BILIRUBIN TOTAL: 0.3 mg/dL (ref 0.3–1.2)
BUN: 26 mg/dL — AB (ref 6–23)
CHLORIDE: 104 meq/L (ref 96–112)
CO2: 19 meq/L (ref 19–32)
CREATININE: 0.74 mg/dL (ref 0.50–1.10)
Calcium: 8.8 mg/dL (ref 8.4–10.5)
GFR, EST NON AFRICAN AMERICAN: 82 mL/min — AB (ref >90)
GLUCOSE: 84 mg/dL (ref 70–99)
Potassium: 4.5 mEq/L (ref 3.7–5.3)
Sodium: 139 mEq/L (ref 137–147)
Total Protein: 6.3 g/dL (ref 6.0–8.3)

## 2013-08-28 MED ORDER — TRACE MINERALS CR-CU-F-FE-I-MN-MO-SE-ZN IV SOLN
INTRAVENOUS | Status: DC
  Filled 2013-08-28: qty 2000

## 2013-08-28 MED ORDER — FAT EMULSION 20 % IV EMUL
250.0000 mL | INTRAVENOUS | Status: AC
  Administered 2013-08-28: 250 mL via INTRAVENOUS
  Filled 2013-08-28: qty 250

## 2013-08-28 MED ORDER — FAT EMULSION 20 % IV EMUL
250.0000 mL | INTRAVENOUS | Status: DC
  Filled 2013-08-28: qty 250

## 2013-08-28 MED ORDER — CLINIMIX E/DEXTROSE (5/15) 5 % IV SOLN
INTRAVENOUS | Status: AC
  Administered 2013-08-28: 17:00:00 via INTRAVENOUS
  Filled 2013-08-28: qty 2000

## 2013-08-28 NOTE — Progress Notes (Signed)
21 Days Post-Op  Subjective: Persistent nausea but no emesis Loose BM this morning No chest pain or SOB  Objective: Vital signs in last 24 hours: Temp:  [97.9 F (36.6 C)-98.7 F (37.1 C)] 98.7 F (37.1 C) (04/01 0536) Pulse Rate:  [99-120] 120 (04/01 0536) Resp:  [18] 18 (04/01 0536) BP: (124-128)/(60-76) 127/72 mmHg (04/01 0536) SpO2:  [94 %-96 %] 96 % (04/01 0536) Last BM Date: 08/26/13  Intake/Output from previous day: 03/31 0701 - 04/01 0700 In: 1030 [I.V.:143; TPN:887] Out: 1725 [Urine:1725] Intake/Output this shift:    Abdomen soft, minimally tender, non distended CV tachy with reg rhythm Lungs clear  Lab Results:   Recent Labs  08/27/13 0445 08/28/13 0500  WBC 8.2 7.8  HGB 11.0* 11.6*  HCT 34.2* 35.4*  PLT 187 192   BMET  Recent Labs  08/27/13 0445 08/28/13 0500  NA 140 139  K 4.4 4.5  CL 105 104  CO2 23 19  GLUCOSE 120* 84  BUN 25* 26*  CREATININE 0.73 0.74  CALCIUM 8.5 8.8   PT/INR No results found for this basename: LABPROT, INR,  in the last 72 hours ABG No results found for this basename: PHART, PCO2, PO2, HCO3,  in the last 72 hours  Studies/Results: No results found.  Anti-infectives: Anti-infectives   Start     Dose/Rate Route Frequency Ordered Stop   08/16/13 1000  metroNIDAZOLE (FLAGYL) IVPB 500 mg  Status:  Discontinued     500 mg 100 mL/hr over 60 Minutes Intravenous Every 8 hours 08/16/13 0854 08/22/13 0957   08/15/13 0900  ciprofloxacin (CIPRO) IVPB 400 mg  Status:  Discontinued     400 mg 200 mL/hr over 60 Minutes Intravenous Every 12 hours 08/15/13 0752 08/22/13 0957   08/08/13 0800  ertapenem (INVANZ) 1 g in sodium chloride 0.9 % 50 mL IVPB     1 g 100 mL/hr over 30 Minutes Intravenous Every 24 hours 08/07/13 1559 08/08/13 0913   08/07/13 0600  ertapenem (INVANZ) 1 g in sodium chloride 0.9 % 50 mL IVPB     1 g 100 mL/hr over 30 Minutes Intravenous On call to O.R. 08/06/13 1431 08/07/13 0829       Assessment/Plan: s/p Procedure(s): LAPAROSCOPY DIAGNOSTIC (N/A) WHIPPLE PROCEDURE (N/A)  Continuing TNA, PT, nausea control Unless she improves, will need SNF  LOS: 21 days    [name] A 08/28/2013

## 2013-08-28 NOTE — Progress Notes (Signed)
NUTRITION FOLLOW UP  Intervention:    TPN per Pharmacy  Diet advancement per MD  Provide Ensure Complete BID and Magic Cup once daily  RD to follow nutrition Care Plan  Nutrition Dx:   Malnutrition related to altered GI function as evidenced by intake </= 50% of estimated energy requirement for >/= 5 days and mild loss of muscle mass, ongoing-TPN now meeting estimated energy needs  Goal:   Intake to meet >90% of estimated nutrition needs, met  Monitor:   TPN adequacy/tolerance, diet advancement/tolerance, PO intake, labs, weight trend.  Assessment:   Patient presented to the hospital on 3/11 for diagnostic laparoscopy and Whipple procedure for pancreatic cancer.  Patient was NPO or on clear liquids for the first 5 days of admission. Diet advanced to full liquids on 3/17. Patient has had a lot of nausea and vomiting, not tolerating full liquid diet. TPN was initiated 3/18.  Patient reports nausea and vomiting since surgery last week. She has been unable to keep down any liquids this morning. Patient reports good appetite and good oral intake with stable weight PTA.   Pt's diet was advanced to full liquids on 3/29 and continues to receive TPN. Rate of Clinamix E 5/15 was increased to 83 ml/hr on 3/30 and pt is now receiving 1620 kcal and 100 grams of protein daily. Per nursing notes, pt has eaten 50% of her meals today. Per MD note, pt has persistent nausea and had loose BM this morning.  Pt reports reflux, nausea, and poor appetite. She also has complaints of food on full liquid diet being served cold and without seasonings. Discussed additional food options on full liquid diet and supplements options. No recent weights on pt. Using bed scale at time of visit- pt's weight is 153 lbs- 9 lbs weight loss in less than 3 weeks.   Height: Ht Readings from Last 1 Encounters:  08/10/13 '5\' 2"'  (1.575 m)    Weight Status:   Wt Readings from Last 1 Encounters:  08/10/13 162 lb 11.2 oz (73.8  kg)    Re-estimated needs:  Kcal: 1650-1850  Protein: 90-110 gm  Fluid: 1.7-2 L  Skin: abdominal incision; +1 generalized edema  Diet Order: Full Liquid   Intake/Output Summary (Last 24 hours) at 08/28/13 1458 Last data filed at 08/28/13 1400  Gross per 24 hour  Intake   1390 ml  Output   2125 ml  Net   -735 ml    Last BM: 3/30   Labs:   Recent Labs Lab 08/22/13 0545  08/26/13 0500 08/27/13 0445 08/28/13 0500  NA 139  < > 140 140 139  K 4.3  < > 4.9 4.4 4.5  CL 101  < > 104 105 104  CO2 22  < > '24 23 19  ' BUN 29*  < > 23 25* 26*  CREATININE 0.72  < > 0.80 0.73 0.74  CALCIUM 9.4  < > 8.6 8.5 8.8  MG 2.5  --  2.4  --   --   PHOS 4.4  --  4.6  --   --   GLUCOSE 150*  < > 90 120* 84  < > = values in this interval not displayed.  CBG (last 3)  No results found for this basename: GLUCAP,  in the last 72 hours  Scheduled Meds: . bisacodyl  10 mg Rectal Daily  . feeding supplement (RESOURCE BREEZE)  1 Container Oral Q24H  . FLUoxetine  20 mg Oral Daily  . gabapentin  200 mg Oral TID AC  . gabapentin  300 mg Oral QHS  . heparin  5,000 Units Subcutaneous 3 times per day  . levothyroxine  100 mcg Intravenous QAC breakfast  . metoprolol  2.5 mg Intravenous 4 times per day  . multivitamin with minerals  1 tablet Oral Q breakfast  . pantoprazole  40 mg Oral QHS  . polyvinyl alcohol  1 drop Both Eyes BID  . topiramate  200 mg Oral BID  . vitamin E  800 Units Oral BID    Continuous Infusions: . Marland KitchenTPN (CLINIMIX-E) Adult 83 mL/hr at 08/27/13 1752  . sodium chloride 20 mL/hr at 08/25/13 0830  . Marland KitchenTPN (CLINIMIX-E) Adult     And  . fat emulsion     Pryor Ochoa RD, LDN Inpatient Clinical Dietitian Pager: [phone] After Hours Pager: [phone]

## 2013-08-28 NOTE — Progress Notes (Addendum)
PARENTERAL NUTRITION CONSULT NOTE - FOLLOW UP  Pharmacy Consult for TPN Indication: intolerance to enteral feeding  Allergies  Allergen Reactions  . Celecoxib Other (See Comments)    unknown  . Glucosamine     unknown  . Metoclopramide Hcl Other (See Comments)    shaking  . Niacin Other (See Comments)    shaking  . 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 Phenazopyridine Hcl Other (See Comments)    unknown  . Sulfonamide Derivatives Nausea And Vomiting  . Trovan [Alatrofloxacin Mesylate] Other (See Comments)    Caused shaking and nervousness  . Benzocaine-Resorcinol Rash  . Sulfa Antibiotics Rash    Patient Measurements: Height: 5' 2" (157.5 cm) Weight: 162 lb 11.2 oz (73.8 kg) IBW/kg (Calculated) : 50.1  Vital Signs: Temp: 98.7 F (37.1 C) (04/01 0536) Temp src: Oral (04/01 0536) BP: 127/72 mmHg (04/01 0536) Pulse Rate: 120 (04/01 0536) Intake/Output from previous day: 03/31 0701 - 04/01 0700 In: 1030 [I.V.:143; TPN:887] Out: 1725 [Urine:1725] Intake/Output from this shift:    Labs:  Recent Labs  08/26/13 0500 08/27/13 0445 08/28/13 0500  WBC 8.6 8.2 7.8  HGB 10.7* 11.0* 11.6*  HCT 32.9* 34.2* 35.4*  PLT 209 187 192     Recent Labs  08/26/13 0500 08/27/13 0445 08/28/13 0500  NA 140 140 139  K 4.9 4.4 4.5  CL 104 105 104  CO2 _0 GLUCOSE 90 120* 84  BUN 23 25* 26*  CREATININE 0.80 0.73 0.74  CALCIUM 8.6 8.5 8.8  MG 2.4  --   --   PHOS 4.6  --   --   PROT 5.8* 5.7* 6.3  ALBUMIN 2.7* 2.6* 3.0*  AST _1 ALT 33 30 31  ALKPHOS 130* 133* 138*  BILITOT 0.2* 0.3 0.3  PREALBUMIN 27.3  --   --   TRIG 384*  --   --    Estimated Creatinine Clearance: 58 ml/min (by C-G formula based on Cr of 0.74).   Insulin Requirements in the past 24 hours:  CBGs and SSI stopped 3/27  Current Nutrition:  Clinimix E 5/15 at 13m/hr + 20% lipid emulsion at 189mhr-provides 90 gm protein and 1758 kCal  daily Full liquid diet   Nutritional Goals:  1650-1850 kCal, 90-110 grams of protein per day per RD recs 3/19  Assessment:   [id number] with pancreatic cancer s/p Whipple procedure on 3/11. She has experienced significant post-operative nausea and vomiting. Diet advanced to full liquids 3/30 and nausea seems to be somewhat better.    GI: s/p Whipple procedure 3/11.  Has had difficulties with nausea and has been intolerant of PO intake.  NGT d/c 3/25.  Upper GI 3/26 demonstrated patency of gastrojejunostomy. Pt states that she is hungry but nauseous and unable to keep anything down. Pt ate some oatmeal but states she vomited it back it up. Loose BMs today. Prealbumin trending up to 27.3.   Endo: A1C 5.2 this admission. CBGs well controlled after surgery so SSI/CBGs d/c. TSH was normal in January- she has been on IV Synthroid 10039m(home dose is 175m59mo).  Lytes: Lytes wnl.  Renal: SCr stable, CrCl ~60mL56m; UOP 1 mL/kg/hr  Pulm: RA  Cards: hx HTN and HLD as well as mitral valve prolapse; BP elevated, HR 99-120. Receiving IV scheduled metoprolol- other home CV medications have not yet been resumed.   Hepatobil: alk phos elevated and slightly trending up,  albumin 3.  Other LFTs wnl. Baseline triglycerides elevated at 282- now up to 384.  Neuro: hx anxiety and depression on gabapentin, topiramate and Prozac.  ID: WBC wnl. Afebrile. Cipro/Flagyl stopped 3/26.  Best Practices: SQ heparin, IV PPI changed to PO 3/27  Home Medications not yet resumed: atenolol, lovastatin, hydroxyzine, calcium + D, cyanocobalamin, aspirin  TPN Access: PICC placed 3/18  TPN day#: 15 (started 3/18)  Plan:  1) Continue Clinimix E 5/15 at 70m/hr + 20% lipid emulsion at 168mhr on M/W/F (starting Wed 4/1) which will provide daily average of 100 g protein and 1620 kCal. TPN + po full liquids likely will meet 100% estimated needs 2) Pt on po MVI (comes with trace elements). Will not add TE to bag.  3) TPN  labs, Mg and Phos  4) Follow-up po intake and ability to wean TPN  BeAlbertina ParrPharmD.  Clinical Pharmacist Pager [phone]

## 2013-08-29 LAB — COMPREHENSIVE METABOLIC PANEL
ALT: 40 U/L — AB (ref 0–35)
AST: 34 U/L (ref 0–37)
Albumin: 3 g/dL — ABNORMAL LOW (ref 3.5–5.2)
Alkaline Phosphatase: 161 U/L — ABNORMAL HIGH (ref 39–117)
BUN: 27 mg/dL — ABNORMAL HIGH (ref 6–23)
CALCIUM: 8.6 mg/dL (ref 8.4–10.5)
CO2: 21 meq/L (ref 19–32)
Chloride: 102 mEq/L (ref 96–112)
Creatinine, Ser: 0.77 mg/dL (ref 0.50–1.10)
GFR calc Af Amer: >90 mL/min (ref >90)
GFR calc non Af Amer: 81 mL/min — ABNORMAL LOW (ref >90)
Glucose, Bld: 127 mg/dL — ABNORMAL HIGH (ref 70–99)
POTASSIUM: 4.3 meq/L (ref 3.7–5.3)
SODIUM: 137 meq/L (ref 137–147)
Total Bilirubin: 0.3 mg/dL (ref 0.3–1.2)
Total Protein: 6.2 g/dL (ref 6.0–8.3)

## 2013-08-29 LAB — CBC
HCT: 33.8 % — ABNORMAL LOW (ref 36.0–46.0)
Hemoglobin: 11 g/dL — ABNORMAL LOW (ref 12.0–15.0)
MCH: 32.2 pg (ref 26.0–34.0)
MCHC: 32.5 g/dL (ref 30.0–36.0)
MCV: 98.8 fL (ref 78.0–100.0)
PLATELETS: 180 10*3/uL (ref 150–400)
RBC: 3.42 MIL/uL — AB (ref 3.87–5.11)
RDW: 14.7 % (ref 11.5–15.5)
WBC: 14.7 10*3/uL — AB (ref 4.0–10.5)

## 2013-08-29 LAB — PHOSPHORUS: PHOSPHORUS: 4.4 mg/dL (ref 2.3–4.6)

## 2013-08-29 LAB — MAGNESIUM: MAGNESIUM: 2.1 mg/dL (ref 1.5–2.5)

## 2013-08-29 MED ORDER — CLINIMIX E/DEXTROSE (5/15) 5 % IV SOLN
INTRAVENOUS | Status: AC
  Administered 2013-08-29: 17:00:00 via INTRAVENOUS
  Filled 2013-08-29: qty 2000

## 2013-08-29 MED ORDER — FAMOTIDINE IN NACL 20-0.9 MG/50ML-% IV SOLN
20.0000 mg | Freq: Two times a day (BID) | INTRAVENOUS | Status: DC
  Administered 2013-08-29 – 2013-09-03 (×10): 20 mg via INTRAVENOUS
  Filled 2013-08-29 (×11): qty 50

## 2013-08-29 NOTE — Progress Notes (Signed)
Occupational Therapy Treatment and goal update Patient Details Name: [name] MRN: [id number] DOB: 11-Feb-1941 Today's Date: 08/29/2013    History of present illness admitted with adenocarcinoma of the pancreatic head, s/p whipple procedure, stenting and diagnostic lap.   OT comments  Pt limited by fatique/nausea and vomiting.  Goals upgraded this session.  Pt will benefit from continued OT  Follow Up Recommendations  SNF    Equipment Recommendations  3 in 1 bedside comode    Recommendations for Other Services      Precautions / Restrictions Precautions Precautions: Fall Precaution Comments: adominal wounds;        Mobility Bed Mobility Overal bed mobility: Modified Independent                Transfers       Sit to Stand: Supervision         General transfer comment: cues for hand placement    Balance                                   ADL       Grooming: Supervision/safety;Oral care;Wash/dry face;Standing                   Toilet Transfer: Supervision/safety;Ambulation;Comfort height toilet;Grab bars   Toileting- Clothing Manipulation and Hygiene: Supervision/safety;Sitting/lateral lean       Functional mobility during ADLs: Supervision/safety;Rolling walker (manage lines) General ADL Comments: pt on commode at beginning of session.  She performed grooming at sink.  Pt vomited 5x in standing and once back in bed.  Assisted her with changing gown and cool cloth applied to forehead:  called for RN.  Pt has AE at home.        Vision                     Perception     Praxis      Cognition   Behavior During Therapy: [facility] for tasks assessed/performed Overall Cognitive Status: Within Functional Limits for tasks assessed                       Extremity/Trunk Assessment               Exercises     Shoulder Instructions       General Comments      Pertinent Vitals/ Pain       No c/o pain.   C/o nausea/indigestion and nerves. Called for RN after vomiting  Home Living                                          Prior Functioning/Environment              Frequency       Progress Toward Goals  OT Goals(current goals can now be found in the care plan section)  Progress towards OT goals: Goals met and updated - see care plan  Acute Rehab OT Goals Time For Goal Achievement: 09/12/13 Potential to Achieve Goals: Good ADL Goals Pt Will Perform Grooming: with modified independence;standing Pt Will Transfer to Toilet: with modified independence;ambulating;regular height toilet Pt Will Perform Toileting - Clothing Manipulation and hygiene: with modified independence;sit to/from stand;sitting/lateral leans Additional ADL Goal #1: pt will gather clothes/supplies and complete adl at mod I  level Additional ADL Goal #2: pt will initiate at least one rest break per session for energy conservation  Plan      Co-evaluation                 End of Session     Activity Tolerance Patient limited by fatigue (and nausea/vomiting)   Patient Left in bed;with call bell/phone within reach   Nurse Communication          Time: 2714-2320 OT Time Calculation (min): 24 min  Charges: OT General Charges $OT Visit: 1 Procedure OT Treatments $Self Care/Home Management : 23-37 mins  Lyrique Hakim 08/29/2013, 10:38 AM  Lesle Chris, OTR/L [phone] 08/29/2013

## 2013-08-29 NOTE — Progress Notes (Signed)
22 Days Post-Op  Subjective: Still with nausea, occasional small amounts of emesis, +BMs  Objective: Vital signs in last 24 hours: Temp:  [98 F (36.7 C)-100.9 F (38.3 C)] 100.9 F (38.3 C) (04/02 0524) Pulse Rate:  [109-124] 124 (04/02 0524) Resp:  [20-24] 20 (04/02 0524) BP: (111-145)/(52-62) 114/61 mmHg (04/02 0524) SpO2:  [93 %-99 %] 93 % (04/02 0524) Last BM Date: 08/28/13  Intake/Output from previous day: 04/01 0701 - 04/02 0700 In: 1806 [P.O.:960; I.V.:160; TPN:686] Out: 2100 [Urine:2100] Intake/Output this shift: Total I/O In: -  Out: 400 [Urine:400]  Abdomen full in epigastrium, otherwise soft and minimally tender  Lab Results:   Recent Labs  08/28/13 0500 08/29/13 0410  WBC 7.8 14.7*  HGB 11.6* 11.0*  HCT 35.4* 33.8*  PLT 192 180   BMET  Recent Labs  08/28/13 0500 08/29/13 0410  NA 139 137  K 4.5 4.3  CL 104 102  CO2 19 21  GLUCOSE 84 127*  BUN 26* 27*  CREATININE 0.74 0.77  CALCIUM 8.8 8.6   PT/INR No results found for this basename: LABPROT, INR,  in the last 72 hours ABG No results found for this basename: PHART, PCO2, PO2, HCO3,  in the last 72 hours  Studies/Results: No results found.  Anti-infectives: Anti-infectives   Start     Dose/Rate Route Frequency Ordered Stop   08/16/13 1000  metroNIDAZOLE (FLAGYL) IVPB 500 mg  Status:  Discontinued     500 mg 100 mL/hr over 60 Minutes Intravenous Every 8 hours 08/16/13 0854 08/22/13 0957   08/15/13 0900  ciprofloxacin (CIPRO) IVPB 400 mg  Status:  Discontinued     400 mg 200 mL/hr over 60 Minutes Intravenous Every 12 hours 08/15/13 0752 08/22/13 0957   08/08/13 0800  ertapenem (INVANZ) 1 g in sodium chloride 0.9 % 50 mL IVPB     1 g 100 mL/hr over 30 Minutes Intravenous Every 24 hours 08/07/13 1559 08/08/13 0913   08/07/13 0600  ertapenem (INVANZ) 1 g in sodium chloride 0.9 % 50 mL IVPB     1 g 100 mL/hr over 30 Minutes Intravenous On call to O.R. 08/06/13 1431 08/07/13 0829       Assessment/Plan: s/p Procedure(s): LAPAROSCOPY DIAGNOSTIC (N/A) WHIPPLE PROCEDURE (N/A)  Ileus  Continue TNA, PT WBC up today.  If it continues to climb, she will need a CT of the abdomen to r/o leak/abscess  LOS: 22 days    Dhara Schepp A 08/29/2013

## 2013-08-29 NOTE — Progress Notes (Signed)
PARENTERAL NUTRITION CONSULT NOTE - FOLLOW UP  Pharmacy Consult for TPN Indication: intolerance to enteral feeding  Allergies  Allergen Reactions  . Celecoxib Other (See Comments)    unknown  . Glucosamine     unknown  . Metoclopramide Hcl Other (See Comments)    shaking  . Niacin Other (See Comments)    shaking  . 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 Phenazopyridine Hcl Other (See Comments)    unknown  . Sulfonamide Derivatives Nausea And Vomiting  . Trovan [Alatrofloxacin Mesylate] Other (See Comments)    Caused shaking and nervousness  . Benzocaine-Resorcinol Rash  . Sulfa Antibiotics Rash    Patient Measurements: Height: 5' 2" (157.5 cm) Weight: 162 lb 11.2 oz (73.8 kg) IBW/kg (Calculated) : 50.1  Vital Signs: Temp: 99.2 F (37.3 C) (04/02 0900) Temp src: Axillary (04/02 0900) BP: 120/59 mmHg (04/02 0900) Pulse Rate: 104 (04/02 0900) Intake/Output from previous day: 04/01 0701 - 04/02 0700 In: 1806 [P.O.:960; I.V.:160; TPN:686] Out: 2100 [Urine:2100] Intake/Output from this shift:    Labs:  Recent Labs  08/27/13 0445 08/28/13 0500 08/29/13 0410  WBC 8.2 7.8 14.7*  HGB 11.0* 11.6* 11.0*  HCT 34.2* 35.4* 33.8*  PLT 187 192 180     Recent Labs  08/27/13 0445 08/28/13 0500 08/29/13 0410  NA 140 139 137  K 4.4 4.5 4.3  CL 105 104 102  CO2 _0 GLUCOSE 120* 84 127*  BUN 25* 26* 27*  CREATININE 0.73 0.74 0.77  CALCIUM 8.5 8.8 8.6  MG  --   --  2.1  PHOS  --   --  4.4  PROT 5.7* 6.3 6.2  ALBUMIN 2.6* 3.0* 3.0*  AST 25 28 34  ALT 30 31 40*  ALKPHOS 133* 138* 161*  BILITOT 0.3 0.3 0.3   Estimated Creatinine Clearance: 58 ml/min (by C-G formula based on Cr of 0.77).   Insulin Requirements in the past 24 hours:  CBGs and SSI stopped 3/27  Current Nutrition:  -Clinimix E 5/15 at 58m/hr + 20% lipid emulsion at 198mhr on MWF only-provides average of 100g protein and 1620 kCal daily -Full  liquid diet- 50% of breakfast, lunch and dinner charted yesterday  Nutritional Goals:  1650-1850 kCal, 90-110 grams of protein per day per RD recs 4/1  Assessment:   [id number] with pancreatic cancer s/p Whipple procedure on 3/11. She has experienced significant post-operative nausea and vomiting. Diet advanced to full liquids 3/30 and nausea seems to be somewhat better.    GI: s/p Whipple procedure 3/11.  Has had difficulties with nausea and has been intolerant of PO intake.  NGT d/c 3/25.  Upper GI 3/26 demonstrated patency of gastrojejunostomy. Pt states that she is hungry but nauseous and unable to keep anything down. Still with nausea and small amounts of emesis. Last BM 4/1. Prealbumin trending up to 27.3 (normal)  Endo: A1C 5.2 this admission. CBGs well controlled after surgery and SSI/CBGs have been d/c'd. TSH was normal in January- she has been on IV Synthroid 10022m(home dose is 175m6mo).  Lytes: Lytes wnl including K 4.3, Phos 4.4 and Mag 2.1. CorCa 9.4  Renal: SCr stable, CrCl ~60mL79m; UOP 1.2 mL/kg/hr  Pulm: RA  Cards: hx HTN and HLD as well as mitral valve prolapse; BP nml-elevated, HR 109-124. Receiving IV scheduled metoprolol- other home CV medications have not yet been resumed.   Hepatobil: alk phos remains elevated and  still trending up, albumin 3, AST nml, ALT slightly elevated at 40. Baseline triglycerides elevated at 282- now up to 384.  Neuro: hx anxiety and depression on gabapentin, topiramate and Prozac.  ID: WBC wnl. Afebrile. Cipro/Flagyl stopped 3/26.  Best Practices: SQ heparin, IV PPI changed to PO 3/27  Home Medications not yet resumed: atenolol, lovastatin, hydroxyzine, calcium + D, cyanocobalamin, aspirin  TPN Access: PICC placed 3/18  TPN day#: 16 (started 3/18)  Plan:  1. Continue Clinimix E 5/15 at 67m/hr + 20% lipid emulsion at 172mhr on M/W/F (starting Wed 4/1) which will provide daily average of 100 g protein and 1620 kCal. TPN + po full  liquids likely will meet 100% estimated needs 2. Pt on po MVI + minerals which includes trace elements, therefore will not add MVI or TE to TPN bag 3. TPN labs as ordered  4. Continue MIVF as 1/2NS @ KVO  4. Follow-up po intake and ability to wean TPN   D. , PharmD, BCPS Clinical Pharmacist Pager: [phone]/06/2013 11:32 AM

## 2013-08-30 LAB — CBC
HCT: 31.2 % — ABNORMAL LOW (ref 36.0–46.0)
HEMOGLOBIN: 10 g/dL — AB (ref 12.0–15.0)
MCH: 31.9 pg (ref 26.0–34.0)
MCHC: 32.1 g/dL (ref 30.0–36.0)
MCV: 99.7 fL (ref 78.0–100.0)
PLATELETS: 135 10*3/uL — AB (ref 150–400)
RBC: 3.13 MIL/uL — ABNORMAL LOW (ref 3.87–5.11)
RDW: 14.7 % (ref 11.5–15.5)
WBC: 5.8 10*3/uL (ref 4.0–10.5)

## 2013-08-30 LAB — COMPREHENSIVE METABOLIC PANEL
ALT: 30 U/L (ref 0–35)
AST: 17 U/L (ref 0–37)
Albumin: 2.7 g/dL — ABNORMAL LOW (ref 3.5–5.2)
Alkaline Phosphatase: 136 U/L — ABNORMAL HIGH (ref 39–117)
BUN: 24 mg/dL — ABNORMAL HIGH (ref 6–23)
CO2: 20 mEq/L (ref 19–32)
Calcium: 8.2 mg/dL — ABNORMAL LOW (ref 8.4–10.5)
Chloride: 105 mEq/L (ref 96–112)
Creatinine, Ser: 0.72 mg/dL (ref 0.50–1.10)
GFR calc non Af Amer: 83 mL/min — ABNORMAL LOW (ref >90)
GLUCOSE: 135 mg/dL — AB (ref 70–99)
POTASSIUM: 3.7 meq/L (ref 3.7–5.3)
SODIUM: 139 meq/L (ref 137–147)
TOTAL PROTEIN: 5.9 g/dL — AB (ref 6.0–8.3)
Total Bilirubin: 0.3 mg/dL (ref 0.3–1.2)

## 2013-08-30 MED ORDER — FAT EMULSION 20 % IV EMUL
250.0000 mL | INTRAVENOUS | Status: AC
  Administered 2013-08-30: 250 mL via INTRAVENOUS
  Filled 2013-08-30: qty 250

## 2013-08-30 MED ORDER — CLINIMIX E/DEXTROSE (5/15) 5 % IV SOLN
INTRAVENOUS | Status: AC
  Administered 2013-08-30: 18:00:00 via INTRAVENOUS
  Filled 2013-08-30: qty 2000

## 2013-08-30 MED ORDER — LEVOTHYROXINE SODIUM 175 MCG PO TABS
175.0000 ug | ORAL_TABLET | Freq: Every day | ORAL | Status: DC
  Administered 2013-08-31 – 2013-09-12 (×12): 175 ug via ORAL
  Filled 2013-08-30 (×15): qty 1

## 2013-08-30 MED ORDER — ATENOLOL 25 MG PO TABS
25.0000 mg | ORAL_TABLET | Freq: Every morning | ORAL | Status: DC
  Administered 2013-08-31 – 2013-09-12 (×12): 25 mg via ORAL
  Filled 2013-08-30 (×15): qty 1

## 2013-08-30 NOTE — Progress Notes (Signed)
PARENTERAL NUTRITION CONSULT NOTE - FOLLOW UP  Pharmacy Consult for TPN Indication: intolerance to enteral feeding  Allergies  Allergen Reactions  . Celecoxib Other (See Comments)    unknown  . Glucosamine     unknown  . Metoclopramide Hcl Other (See Comments)    shaking  . Niacin Other (See Comments)    shaking  . 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 Phenazopyridine Hcl Other (See Comments)    unknown  . Sulfonamide Derivatives Nausea And Vomiting  . Trovan [Alatrofloxacin Mesylate] Other (See Comments)    Caused shaking and nervousness  . Benzocaine-Resorcinol Rash  . Sulfa Antibiotics Rash    Patient Measurements: Height: 5' 2" (157.5 cm) Weight: 162 lb 11.2 oz (73.8 kg) IBW/kg (Calculated) : 50.1  Vital Signs: Temp: 98.4 F (36.9 C) (04/03 0420) Temp src: Oral (04/03 0420) BP: 111/54 mmHg (04/03 0420) Pulse Rate: 92 (04/03 0420) Intake/Output from previous day: 04/02 0701 - 04/03 0700 In: 3374 [P.O.:480; I.V.:1151; TPN:3312] Out: 1400 [Urine:1400] Intake/Output from this shift:    Labs:  Recent Labs  08/28/13 0500 08/29/13 0410 08/30/13 0500  WBC 7.8 14.7* 5.8  HGB 11.6* 11.0* 10.0*  HCT 35.4* 33.8* 31.2*  PLT 192 180 135*     Recent Labs  08/28/13 0500 08/29/13 0410 08/30/13 0500  NA 139 137 139  K 4.5 4.3 3.7  CL 104 102 105  CO2 _0 GLUCOSE 84 127* 135*  BUN 26* 27* 24*  CREATININE 0.74 0.77 0.72  CALCIUM 8.8 8.6 8.2*  MG  --  2.1  --   PHOS  --  4.4  --   PROT 6.3 6.2 5.9*  ALBUMIN 3.0* 3.0* 2.7*  AST 28 34 17  ALT 31 40* 30  ALKPHOS 138* 161* 136*  BILITOT 0.3 0.3 0.3   Estimated Creatinine Clearance: 58 ml/min (by C-G formula based on Cr of 0.72).   Insulin Requirements in the past 24 hours:  CBGs and SSI stopped 3/27  Current Nutrition:  -Clinimix E 5/15 at 64m/hr + 20% lipid emulsion at 152mhr on MWF only-provides average of 100g protein and 1620 kCal daily -Full liquid  diet- 50% of breakfast, lunch and dinner charted yesterday  Nutritional Goals:  1650-1850 kCal, 90-110 grams of protein per day per RD recs 4/1  Assessment:   [id number] with pancreatic cancer s/p Whipple procedure on 3/11. She has experienced significant post-operative nausea and vomiting. Diet advanced to full liquids 3/30 and nausea seems to be somewhat better.    GI: s/p Whipple procedure 3/11.  Has had difficulties with nausea and has been intolerant of PO intake.  NGT d/c 3/25.  Upper GI 3/26 demonstrated patency of gastrojejunostomy. Advanced to full liquid diet. Pt states that she is hungry but nauseous and unable to keep anything down. Still with nausea. Vomited once yesterday. Pt not finding much on liquid tray that she likes. Last BM 4/1. Prealbumin trending up to 27.3 (normal)  Endo: A1C 5.2 this admission. CBGs well controlled after surgery and SSI/CBGs have been d/c'd. TSH was normal in January- Synthroid switched to PO home dose  Lytes: Lytes wnl including K 3.7, Phos 4.4 and Mag 2.1. CorCa 9.2  Renal: SCr stable, CrCl ~6064min; UOP 0.8 mL/kg/hr  Pulm: RA  Cards: hx HTN and HLD as well as mitral valve prolapse; BP nml-elevated, HR 109-124. On home dose atenolol- other home CV medications have not yet been resumed.  Hepatobil: alk phos remains elevated but trended down today, albumin 3, AST/ALT wnl. Baseline triglycerides elevated at 282- now up to 384.  Neuro: hx anxiety and depression on gabapentin, topiramate and Prozac.  ID: WBC wnl. Afebrile. Cipro/Flagyl stopped 3/26.  Best Practices: SQ heparin, IV PPI changed to PO 3/27  Home Medications not yet resumed: lovastatin, hydroxyzine, calcium + D, cyanocobalamin, aspirin  TPN Access: PICC placed 3/18  TPN day#: 17 (started 3/18)  Plan:  1. Continue Clinimix E 5/15 at 23m/hr + 20% lipid emulsion at 154mhr on M/W/F (starting Wed 4/1) which will provide daily average of 100 g protein and 1620 kCal. TPN + po full  liquids likely will meet 100% estimated needs 2. Pt on po MVI + minerals which includes trace elements, therefore will not add MVI or TE to TPN bag 3. TPN labs as ordered  4. Continue MIVF as 1/2NS @ KVO  4. Follow-up po intake and ability to wean TPN  BeAlbertina ParrPharmD.  Clinical Pharmacist Pager [phone]

## 2013-08-30 NOTE — Progress Notes (Signed)
Patient ID: [name], female   DOB: 10-02-1940, 73 y.o.   MRN: [id number] 23 Days Post-Op  Subjective: Patient fell last night onto her left side and is somewhat sore.  Still with occasional nausea. Vomited once yesterday. Not really finding much on a full liquid tray that she likes. Nurses are working with her to find options.  Objective: Vital signs in last 24 hours: Temp:  [97.8 F (36.6 C)-98.5 F (36.9 C)] 98.4 F (36.9 C) (04/03 0420) Pulse Rate:  [92-107] 92 (04/03 0420) Resp:  [20-21] 20 (04/03 0420) BP: (111-127)/(45-96) 111/54 mmHg (04/03 0420) SpO2:  [95 %-99 %] 96 % (04/03 0420) Last BM Date: 08/29/13  Intake/Output from previous day: 04/02 0701 - 04/03 0700 In: 4943 [P.O.:480; I.V.:1151; TPN:3312] Out: 1400 [Urine:1400] Intake/Output this shift:    General appearance: alert, cooperative, fatigued and no distress GI: mild epigastric tenderness, no guarding, nondistended Chest wall: No bruising or crepitus or other abnormalities along the left chest wall where she fell and has soreness Incision/Wound: well healed without erythema or drainage  Lab Results:   Recent Labs  08/29/13 0410 08/30/13 0500  WBC 14.7* 5.8  HGB 11.0* 10.0*  HCT 33.8* 31.2*  PLT 180 135*   BMET  Recent Labs  08/29/13 0410 08/30/13 0500  NA 137 139  K 4.3 3.7  CL 102 105  CO2 21 20  GLUCOSE 127* 135*  BUN 27* 24*  CREATININE 0.77 0.72  CALCIUM 8.6 8.2*     Studies/Results: No results found.  Anti-infectives: Anti-infectives   Start     Dose/Rate Route Frequency Ordered Stop   08/16/13 1000  metroNIDAZOLE (FLAGYL) IVPB 500 mg  Status:  Discontinued     500 mg 100 mL/hr over 60 Minutes Intravenous Every 8 hours 08/16/13 0854 08/22/13 0957   08/15/13 0900  ciprofloxacin (CIPRO) IVPB 400 mg  Status:  Discontinued     400 mg 200 mL/hr over 60 Minutes Intravenous Every 12 hours 08/15/13 0752 08/22/13 0957   08/08/13 0800  ertapenem (INVANZ) 1 g in sodium chloride 0.9 %  50 mL IVPB     1 g 100 mL/hr over 30 Minutes Intravenous Every 24 hours 08/07/13 1559 08/08/13 0913   08/07/13 0600  ertapenem (INVANZ) 1 g in sodium chloride 0.9 % 50 mL IVPB     1 g 100 mL/hr over 30 Minutes Intravenous On call to O.R. 08/06/13 1431 08/07/13 0829      Assessment/Plan: s/p Procedure(s): LAPAROSCOPY DIAGNOSTIC WHIPPLE PROCEDURE Persistent postoperative nausea, apparent delayed gastric emptying. Stable to possibly slightly improved. Continue full liquids as tolerated and TNA. Leukocytosis yesterday, resolved on repeat and afebrile. Continue physical therapy. Change IV medications to oral I called the patient's son with an update.   LOS: 23 days    [name] 08/30/2013

## 2013-08-30 NOTE — Progress Notes (Signed)
Physical Therapy Treatment Patient Details Name: [name] MRN: [id number] DOB: [birth year] Today's Date: 08/30/2013    History of Present Illness      PT Comments    Patient tolerated therapy today. Patient able to perform ADL's and bed mobility. Still requires cues for safety and judgement with gait. Continue to recommend SNF for supervision and safety. Patient fell in am, see chart for details.    Follow Up Recommendations  SNF;Supervision for mobility/OOB     Equipment Recommendations  None recommended by PT    Recommendations for Other Services       Precautions / Restrictions Precautions Precautions: Fall Precaution Comments: adominal wounds;  Restrictions Weight Bearing Restrictions: No    Mobility  Bed Mobility Overal bed mobility: Modified Independent Bed Mobility: Supine to Sit;Sit to Supine Rolling: Min guard Sidelying to sit: Min guard Supine to sit: Min guard;Supervision Sit to supine: Supervision   General bed mobility comments: increased time to perform   Transfers Overall transfer level: Needs assistance Equipment used: Rolling walker (2 wheeled) Transfers: Sit to/from Stand Sit to Stand: Supervision         General transfer comment: Cues for safety   Ambulation/Gait Ambulation/Gait assistance: Min guard Ambulation Distance (Feet): 200 Feet Assistive device: Rolling walker (2 wheeled) Gait Pattern/deviations: Decreased stride length;Step-through pattern Gait velocity: decr   General Gait Details: patient required VC for safety and judement manuevering in hall   Stairs            Wheelchair Mobility    Modified Rankin (Stroke Patients Only)       Balance Overall balance assessment: Needs assistance           Standing balance-Leahy Scale: Fair Standing balance comment: Patient was able to perform ADL's at counter without UE support in standing position                    Cognition Arousal/Alertness:  Awake/alert Behavior During Therapy: WFL for tasks assessed/performed Overall Cognitive Status: Within Functional Limits for tasks assessed                      Exercises      General Comments        Pertinent Vitals/Pain Patient c/o discomfort from fall this morning on L side, unrated    Home Living                      Prior Function            PT Goals (current goals can now be found in the care plan section) Acute Rehab PT Goals Patient Stated Goal: not stated PT Goal Formulation: With patient Time For Goal Achievement: 08/23/13 Potential to Achieve Goals: Good Progress towards PT goals: Progressing toward goals    Frequency  Min 3X/week    PT Plan Current plan remains appropriate    Co-evaluation             End of Session Equipment Utilized During Treatment: Gait belt Activity Tolerance: Patient tolerated treatment well Patient left: in bed;with call bell/phone within reach;with bed alarm set     Time: 1345-1420 PT Time Calculation (min): 35 min  Charges:  $Gait Training: 8-22 mins $Therapeutic Activity: 8-22 mins                    G Codes:      [name], SPTA 08/30/2013, 2:39 PM

## 2013-08-30 NOTE — Progress Notes (Signed)
CSW continues to follow for support and assistance with SNF at d/c.    CSW will continue to follow Pt for d/c planning.    [facility]  [address]; [phone]  Phone: [phone]

## 2013-08-30 NOTE — Progress Notes (Signed)
Seen and agreed 08/30/2013 Robinette, Julia Elizabeth PTA [phone] pager [phone] office    

## 2013-08-30 NOTE — Progress Notes (Signed)
Pt fell off of BSC around 0100 this morning. Pt vital signs where took, no injury noted, and pt did not hit their head. MD on call notified and no orders where given at that time. Pt's son [name] was called but got no answer, but voice mail was left.

## 2013-08-31 LAB — COMPREHENSIVE METABOLIC PANEL
ALT: 59 U/L — AB (ref 0–35)
AST: 64 U/L — ABNORMAL HIGH (ref 0–37)
Albumin: 2.8 g/dL — ABNORMAL LOW (ref 3.5–5.2)
Alkaline Phosphatase: 194 U/L — ABNORMAL HIGH (ref 39–117)
BUN: 25 mg/dL — AB (ref 6–23)
CO2: 20 meq/L (ref 19–32)
CREATININE: 0.66 mg/dL (ref 0.50–1.10)
Calcium: 8.6 mg/dL (ref 8.4–10.5)
Chloride: 104 mEq/L (ref 96–112)
GFR, EST NON AFRICAN AMERICAN: 85 mL/min — AB (ref >90)
GLUCOSE: 114 mg/dL — AB (ref 70–99)
Potassium: 4.1 mEq/L (ref 3.7–5.3)
Sodium: 140 mEq/L (ref 137–147)
TOTAL PROTEIN: 6.2 g/dL (ref 6.0–8.3)
Total Bilirubin: 0.5 mg/dL (ref 0.3–1.2)

## 2013-08-31 LAB — CBC
HCT: 32.1 % — ABNORMAL LOW (ref 36.0–46.0)
HEMOGLOBIN: 10.5 g/dL — AB (ref 12.0–15.0)
MCH: 32.5 pg (ref 26.0–34.0)
MCHC: 32.7 g/dL (ref 30.0–36.0)
MCV: 99.4 fL (ref 78.0–100.0)
Platelets: 135 10*3/uL — ABNORMAL LOW (ref 150–400)
RBC: 3.23 MIL/uL — AB (ref 3.87–5.11)
RDW: 14.9 % (ref 11.5–15.5)
WBC: 6.7 10*3/uL (ref 4.0–10.5)

## 2013-08-31 MED ORDER — ALUM & MAG HYDROXIDE-SIMETH 200-200-20 MG/5ML PO SUSP
30.0000 mL | ORAL | Status: DC | PRN
  Administered 2013-08-31 – 2013-09-09 (×10): 30 mL via ORAL
  Filled 2013-08-31 (×11): qty 30

## 2013-08-31 MED ORDER — CLINIMIX E/DEXTROSE (5/15) 5 % IV SOLN
INTRAVENOUS | Status: AC
  Administered 2013-08-31: 18:00:00 via INTRAVENOUS
  Filled 2013-08-31: qty 2000

## 2013-08-31 NOTE — Progress Notes (Signed)
PARENTERAL NUTRITION CONSULT NOTE - FOLLOW UP  Pharmacy Consult for TPN Indication: intolerance to enteral feeding  Allergies  Allergen Reactions  . Celecoxib Other (See Comments)    unknown  . Glucosamine     unknown  . Metoclopramide Hcl Other (See Comments)    shaking  . Niacin Other (See Comments)    shaking  . 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 Phenazopyridine Hcl Other (See Comments)    unknown  . Sulfonamide Derivatives Nausea And Vomiting  . Trovan [Alatrofloxacin Mesylate] Other (See Comments)    Caused shaking and nervousness  . Benzocaine-Resorcinol Rash  . Sulfa Antibiotics Rash    Patient Measurements: Height: 5\' 2"  (157.5 cm) Weight: 162 lb 11.2 oz (73.8 kg) IBW/kg (Calculated) : 50.1  Vital Signs: Temp: 98.2 F (36.8 C) (04/04 0621) Temp src: Oral (04/04 0621) BP: 124/69 mmHg (04/04 0936) Pulse Rate: 121 (04/04 0936) Intake/Output from previous day: 04/03 0701 - 04/04 0700 In: 460 [I.V.:460] Out: 500 [Urine:500] Intake/Output from this shift: Total I/O In: 220 [P.O.:220] Out: -   Labs:  Recent Labs  08/29/13 0410 08/30/13 0500 08/31/13 0600  WBC 14.7* 5.8 6.7  HGB 11.0* 10.0* 10.5*  HCT 33.8* 31.2* 32.1*  PLT 180 135* 135*     Recent Labs  08/29/13 0410 08/30/13 0500 08/31/13 0600  NA 137 139 140  K 4.3 3.7 4.1  CL 102 105 104  CO2 21 20 20   GLUCOSE 127* 135* 114*  BUN 27* 24* 25*  CREATININE 0.77 0.72 0.66  CALCIUM 8.6 8.2* 8.6  MG 2.1  --   --   PHOS 4.4  --   --   PROT 6.2 5.9* 6.2  ALBUMIN 3.0* 2.7* 2.8*  AST 34 17 64*  ALT 40* 30 59*  ALKPHOS 161* 136* 194*  BILITOT 0.3 0.3 0.5   Estimated Creatinine Clearance: 58 ml/min (by C-G formula based on Cr of 0.66).   Insulin Requirements in the past 24 hours:  CBGs and SSI stopped 3/27  Current Nutrition:  -Clinimix E 5/15 at 29mL/hr + 20% lipid emulsion at 71mL/hr on MWF only-provides average of 100g protein and 1620 kCal  daily -Full liquid diet but little PO intake is being documented  Nutritional Goals:  1650-1850 kCal, 90-110 grams of protein per day per RD recs 4/1  Assessment:   84 YOF with pancreatic cancer s/p Whipple procedure on 3/11. She has experienced significant post-operative nausea and vomiting. Diet advanced to full liquids 3/30 and nausea seems to be somewhat better.    GI: s/p Whipple procedure 3/11.  Has had difficulties with nausea and has been intolerant of PO intake.  NGT d/c 3/25.  Upper GI 3/26 demonstrated patency of gastrojejunostomy. Advanced to full liquid diet but little PO intake documented. Pt states that she is hungry but nauseous and unable to keep anything down. Prealbumin trending up to 27.3 (normal)  Endo: A1c 5.2 this admission - CBGs excellent, TSH WNL in January, synthroid  Lytes: Lytes WNL  Renal: Stable renal fxn  Pulm: 96% RA  Cards: Hx HTN, HLD, MV prolapse - BP 124/69, HR 121 - atenolol   Hepatobil: LFTs bumped up, Tbili WNL, trigs up to 384  Neuro: Hx anxiety, depression - gabapentin, fluoxetine, topiramate  ID: Afebrile, WBC WNL, no abx  Best Practices: SQ heparin, PPI PO  TPN Access: PICC placed 3/18  TPN day#: 18 (started 3/18)  Plan:  1. Continue Clinimix  E 5/15 at 75mL/hr + 20% lipid emulsion at 71mL/hr on M/W/F which will provide daily average of 100 g protein and 1620 kCal. TPN + po full liquids likely will meet 100% estimated needs 2. On PO MVI so no MVI or TE added to TPN 3. F/u PO intake and ability to wean TPN  Salome Arnt, PharmD, BCPS Pager # [phone] 08/31/2013 11:40 AM

## 2013-08-31 NOTE — Progress Notes (Signed)
Patient ID: [name], female   DOB: 04/13/1941, 73 y.o.   MRN: [id number] 24 Days Post-Op  Subjective: She has been able to tolerate her full liquid diet somewhat better yesterday and this morning but currently had a recent episode of emesis and has quite a bit of heartburn. Just medicated. No abdominal pain or other specific complaints.  Objective: Vital signs in last 24 hours: Temp:  [98 F (36.7 C)-98.2 F (36.8 C)] 98.2 F (36.8 C) (04/04 0621) Pulse Rate:  [96-121] 121 (04/04 0936) Resp:  [18] 18 (04/04 0621) BP: (109-124)/(57-69) 124/69 mmHg (04/04 0936) SpO2:  [96 %-98 %] 96 % (04/04 0621) Last BM Date: 08/31/13  Intake/Output from previous day: 04/03 0701 - 04/04 0700 In: 460 [I.V.:460] Out: 500 [Urine:500] Intake/Output this shift: Total I/O In: 220 [P.O.:220] Out: -   General appearance: alert, cooperative and no distress Resp: clear to auscultation bilaterally GI: minimal right upper quadrant tenderness.  Lab Results:   Recent Labs  08/30/13 0500 08/31/13 0600  WBC 5.8 6.7  HGB 10.0* 10.5*  HCT 31.2* 32.1*  PLT 135* 135*   BMET  Recent Labs  08/30/13 0500 08/31/13 0600  NA 139 140  K 3.7 4.1  CL 105 104  CO2 20 20  GLUCOSE 135* 114*  BUN 24* 25*  CREATININE 0.72 0.66  CALCIUM 8.2* 8.6     Studies/Results: No results found.  Anti-infectives: Anti-infectives   Start     Dose/Rate Route Frequency Ordered Stop   08/16/13 1000  metroNIDAZOLE (FLAGYL) IVPB 500 mg  Status:  Discontinued     500 mg 100 mL/hr over 60 Minutes Intravenous Every 8 hours 08/16/13 0854 08/22/13 0957   08/15/13 0900  ciprofloxacin (CIPRO) IVPB 400 mg  Status:  Discontinued     400 mg 200 mL/hr over 60 Minutes Intravenous Every 12 hours 08/15/13 0752 08/22/13 0957   08/08/13 0800  ertapenem (INVANZ) 1 g in sodium chloride 0.9 % 50 mL IVPB     1 g 100 mL/hr over 30 Minutes Intravenous Every 24 hours 08/07/13 1559 08/08/13 0913   08/07/13 0600  ertapenem (INVANZ) 1  g in sodium chloride 0.9 % 50 mL IVPB     1 g 100 mL/hr over 30 Minutes Intravenous On call to O.R. 08/06/13 1431 08/07/13 0829      Assessment/Plan: s/p Procedure(s): LAPAROSCOPY DIAGNOSTIC WHIPPLE PROCEDURE Delayed gastric emptying.  White count remains normal and no evidence of infection. Overall I think some slight improvement in tolerating by mouth intake. Continue full liquids and TNA.   LOS: 24 days    [name] T 08/31/2013

## 2013-08-31 NOTE — Progress Notes (Signed)
Pt. Had episode of N/V.  Given IV zofran.  Will continue to monitor. Syliva Overman

## 2013-09-01 LAB — CBC
HEMATOCRIT: 34.9 % — AB (ref 36.0–46.0)
HEMOGLOBIN: 11.3 g/dL — AB (ref 12.0–15.0)
MCH: 31.9 pg (ref 26.0–34.0)
MCHC: 32.4 g/dL (ref 30.0–36.0)
MCV: 98.6 fL (ref 78.0–100.0)
Platelets: 143 10*3/uL — ABNORMAL LOW (ref 150–400)
RBC: 3.54 MIL/uL — AB (ref 3.87–5.11)
RDW: 14.7 % (ref 11.5–15.5)
WBC: 7.1 10*3/uL (ref 4.0–10.5)

## 2013-09-01 LAB — COMPREHENSIVE METABOLIC PANEL WITH GFR
ALT: 68 U/L — ABNORMAL HIGH (ref 0–35)
AST: 41 U/L — ABNORMAL HIGH (ref 0–37)
Albumin: 2.9 g/dL — ABNORMAL LOW (ref 3.5–5.2)
Alkaline Phosphatase: 229 U/L — ABNORMAL HIGH (ref 39–117)
BUN: 24 mg/dL — ABNORMAL HIGH (ref 6–23)
CO2: 21 meq/L (ref 19–32)
Calcium: 9 mg/dL (ref 8.4–10.5)
Chloride: 103 meq/L (ref 96–112)
Creatinine, Ser: 0.71 mg/dL (ref 0.50–1.10)
GFR calc Af Amer: >90 mL/min
GFR calc non Af Amer: 83 mL/min — ABNORMAL LOW
Glucose, Bld: 119 mg/dL — ABNORMAL HIGH (ref 70–99)
Potassium: 4.3 meq/L (ref 3.7–5.3)
Sodium: 140 meq/L (ref 137–147)
Total Bilirubin: 0.4 mg/dL (ref 0.3–1.2)
Total Protein: 6.7 g/dL (ref 6.0–8.3)

## 2013-09-01 MED ORDER — CLINIMIX E/DEXTROSE (5/15) 5 % IV SOLN
INTRAVENOUS | Status: AC
  Administered 2013-09-01: 18:00:00 via INTRAVENOUS
  Filled 2013-09-01: qty 2000

## 2013-09-01 NOTE — Progress Notes (Signed)
PARENTERAL NUTRITION CONSULT NOTE - FOLLOW UP  Pharmacy Consult for TPN Indication: intolerance to enteral feeding  Allergies  Allergen Reactions  . Celecoxib Other (See Comments)    unknown  . Glucosamine     unknown  . Metoclopramide Hcl Other (See Comments)    shaking  . Niacin Other (See Comments)    shaking  . 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 Phenazopyridine Hcl Other (See Comments)    unknown  . Sulfonamide Derivatives Nausea And Vomiting  . Trovan [Alatrofloxacin Mesylate] Other (See Comments)    Caused shaking and nervousness  . Benzocaine-Resorcinol Rash  . Sulfa Antibiotics Rash    Patient Measurements: Height: 5\' 2"  (157.5 cm) Weight: 162 lb 11.2 oz (73.8 kg) IBW/kg (Calculated) : 50.1  Vital Signs: Temp: 97.9 F (36.6 C) (04/05 0430) Temp src: Oral (04/05 0430) BP: 112/62 mmHg (04/05 0430) Pulse Rate: 102 (04/05 0430) Intake/Output from previous day: 04/04 0701 - 04/05 0700 In: 920 [P.O.:440; I.V.:480] Out: 900 [Emesis/NG output:900] Intake/Output from this shift: Total I/O In: -  Out: 400 [Urine:400]  Labs:  Recent Labs  08/30/13 0500 08/31/13 0600 09/01/13 0445  WBC 5.8 6.7 7.1  HGB 10.0* 10.5* 11.3*  HCT 31.2* 32.1* 34.9*  PLT 135* 135* 143*     Recent Labs  08/30/13 0500 08/31/13 0600 09/01/13 0445  NA 139 140 140  K 3.7 4.1 4.3  CL 105 104 103  CO2 20 20 21   GLUCOSE 135* 114* 119*  BUN 24* 25* 24*  CREATININE 0.72 0.66 0.71  CALCIUM 8.2* 8.6 9.0  PROT 5.9* 6.2 6.7  ALBUMIN 2.7* 2.8* 2.9*  AST 17 64* 41*  ALT 30 59* 68*  ALKPHOS 136* 194* 229*  BILITOT 0.3 0.5 0.4   Estimated Creatinine Clearance: 58 ml/min (by C-G formula based on Cr of 0.71).   Insulin Requirements in the past 24 hours:  CBGs and SSI stopped 3/27  Current Nutrition:  -Clinimix E 5/15 at 53mL/hr + 20% lipid emulsion at 74mL/hr on MWF only-provides average of 100g protein and 1620 kCal daily -Full  liquid diet but little PO intake is being documented  Nutritional Goals:  1650-1850 kCal, 90-110 grams of protein per day per RD recs 4/1  Assessment:   25 YOF with pancreatic cancer s/p Whipple procedure on 3/11. She has experienced significant post-operative nausea and vomiting. Diet advanced to full liquids 3/30 and nausea seems to be somewhat better but not further advancing diet yet   GI: s/p Whipple procedure 3/11.  Has had difficulties with nausea and has been intolerant of PO intake.  NGT d/c 3/25.  Upper GI 3/26 demonstrated patency of gastrojejunostomy. Advanced to full liquid diet but little PO intake documented. Pt states that she is hungry but nauseous and unable to keep anything down. Prealbumin trending up to 27.3 (normal). Continued N/V and no advancement of diet although MD noted improvement. Pt is taking PO meds. Plan is to continue TPN + full liquids for now  Endo: A1c 5.2 this admission - CBGs excellent, TSH WNL in January, synthroid  Lytes: Lytes WNL  Renal: Stable renal fxn, 1/2 NS at [place]: 97% RA  Cards: Hx HTN, HLD, MV prolapse - BP 112/62, HR 102 - atenolol   Hepatobil: LFTs slightly increasing, Tbili WNL, trigs up to 384  Neuro: Hx anxiety, depression - gabapentin, fluoxetine, topiramate  ID: Afebrile, WBC WNL, no abx  Best Practices: SQ heparin, PPI  PO  TPN Access: PICC placed 3/18  TPN day#: 19 (started 3/18)  Plan:  1. Continue Clinimix E 5/15 at 43mL/hr + 20% lipid emulsion at 82mL/hr on M/W/F which will provide daily average of 100 g protein and 1620 kCal. TPN + po full liquids likely will meet 100% estimated needs 2. On PO MVI so no MVI or TE added to TPN 3. F/u PO intake and ability to wean TPN 4. F/u AM labs  Salome Arnt, PharmD, BCPS Pager # [phone] 09/01/2013 7:53 AM

## 2013-09-01 NOTE — Progress Notes (Signed)
Patient ID: [name], female   DOB: 1940/11/19, 73 y.o.   MRN: [id number] 25 Days Post-Op  Subjective: Feels better today. Tolerated  full liquid diet pretty well yesterday and this morning. Would like to try a soft diet for more variety.  Objective: Vital signs in last 24 hours: Temp:  [97.6 F (36.4 C)-98.2 F (36.8 C)] 97.9 F (36.6 C) (04/05 0430) Pulse Rate:  [98-102] 102 (04/05 0430) Resp:  [17-18] 18 (04/05 0430) BP: (112-132)/(62-73) 112/62 mmHg (04/05 0430) SpO2:  [97 %-98 %] 97 % (04/05 0430) Last BM Date: 08/31/13  Intake/Output from previous day: 04/04 0701 - 04/05 0700 In: 920 [P.O.:440; I.V.:480] Out: 900 [Emesis/NG output:900] Intake/Output this shift: Total I/O In: 220 [P.O.:220] Out: 400 [Urine:400]  General appearance: alert, cooperative and no distress GI: normal findings: soft, non-tender and non-distended  Lab Results:   Recent Labs  08/31/13 0600 09/01/13 0445  WBC 6.7 7.1  HGB 10.5* 11.3*  HCT 32.1* 34.9*  PLT 135* 143*   BMET  Recent Labs  08/31/13 0600 09/01/13 0445  NA 140 140  K 4.1 4.3  CL 104 103  CO2 20 21  GLUCOSE 114* 119*  BUN 25* 24*  CREATININE 0.66 0.71  CALCIUM 8.6 9.0     Studies/Results: No results found.  Anti-infectives: Anti-infectives   Start     Dose/Rate Route Frequency Ordered Stop   08/16/13 1000  metroNIDAZOLE (FLAGYL) IVPB 500 mg  Status:  Discontinued     500 mg 100 mL/hr over 60 Minutes Intravenous Every 8 hours 08/16/13 0854 08/22/13 0957   08/15/13 0900  ciprofloxacin (CIPRO) IVPB 400 mg  Status:  Discontinued     400 mg 200 mL/hr over 60 Minutes Intravenous Every 12 hours 08/15/13 0752 08/22/13 0957   08/08/13 0800  ertapenem (INVANZ) 1 g in sodium chloride 0.9 % 50 mL IVPB     1 g 100 mL/hr over 30 Minutes Intravenous Every 24 hours 08/07/13 1559 08/08/13 0913   08/07/13 0600  ertapenem (INVANZ) 1 g in sodium chloride 0.9 % 50 mL IVPB     1 g 100 mL/hr over 30 Minutes Intravenous On  call to O.R. 08/06/13 1431 08/07/13 0829      Assessment/Plan: s/p Procedure(s): LAPAROSCOPY DIAGNOSTIC WHIPPLE PROCEDURE Apparent delayed gastric emptying, seems to be slowly improving. Advanced to soft diet.    LOS: 25 days    Sharma Lawrance T 09/01/2013

## 2013-09-02 ENCOUNTER — Inpatient Hospital Stay (HOSPITAL_COMMUNITY): Payer: Medicare Other

## 2013-09-02 LAB — DIFFERENTIAL
BASOS ABS: 0 10*3/uL (ref 0.0–0.1)
Basophils Relative: 0 % (ref 0–1)
Eosinophils Absolute: 0.4 10*3/uL (ref 0.0–0.7)
Eosinophils Relative: 7 % — ABNORMAL HIGH (ref 0–5)
Lymphocytes Relative: 25 % (ref 12–46)
Lymphs Abs: 1.6 10*3/uL (ref 0.7–4.0)
Monocytes Absolute: 0.8 10*3/uL (ref 0.1–1.0)
Monocytes Relative: 12 % (ref 3–12)
NEUTROS ABS: 3.6 10*3/uL (ref 1.7–7.7)
Neutrophils Relative %: 56 % (ref 43–77)

## 2013-09-02 LAB — COMPREHENSIVE METABOLIC PANEL
ALT: 59 U/L — ABNORMAL HIGH (ref 0–35)
AST: 37 U/L (ref 0–37)
Albumin: 2.7 g/dL — ABNORMAL LOW (ref 3.5–5.2)
Alkaline Phosphatase: 228 U/L — ABNORMAL HIGH (ref 39–117)
BUN: 26 mg/dL — ABNORMAL HIGH (ref 6–23)
CALCIUM: 8.9 mg/dL (ref 8.4–10.5)
CO2: 21 meq/L (ref 19–32)
CREATININE: 0.71 mg/dL (ref 0.50–1.10)
Chloride: 105 mEq/L (ref 96–112)
GFR calc Af Amer: >90 mL/min (ref >90)
GFR, EST NON AFRICAN AMERICAN: 83 mL/min — AB (ref >90)
Glucose, Bld: 116 mg/dL — ABNORMAL HIGH (ref 70–99)
Potassium: 4.3 mEq/L (ref 3.7–5.3)
Sodium: 142 mEq/L (ref 137–147)
TOTAL PROTEIN: 6.3 g/dL (ref 6.0–8.3)
Total Bilirubin: 0.4 mg/dL (ref 0.3–1.2)

## 2013-09-02 LAB — CBC
HCT: 33.7 % — ABNORMAL LOW (ref 36.0–46.0)
Hemoglobin: 10.8 g/dL — ABNORMAL LOW (ref 12.0–15.0)
MCH: 31.9 pg (ref 26.0–34.0)
MCHC: 32 g/dL (ref 30.0–36.0)
MCV: 99.4 fL (ref 78.0–100.0)
Platelets: 141 10*3/uL — ABNORMAL LOW (ref 150–400)
RBC: 3.39 MIL/uL — ABNORMAL LOW (ref 3.87–5.11)
RDW: 14.9 % (ref 11.5–15.5)
WBC: 6.4 10*3/uL (ref 4.0–10.5)

## 2013-09-02 LAB — TRIGLYCERIDES: TRIGLYCERIDES: 260 mg/dL — AB (ref <150)

## 2013-09-02 LAB — MAGNESIUM: Magnesium: 2.3 mg/dL (ref 1.5–2.5)

## 2013-09-02 LAB — PREALBUMIN: Prealbumin: 21.6 mg/dL (ref 17.0–34.0)

## 2013-09-02 LAB — PHOSPHORUS: PHOSPHORUS: 4.7 mg/dL — AB (ref 2.3–4.6)

## 2013-09-02 MED ORDER — FAT EMULSION 20 % IV EMUL
250.0000 mL | INTRAVENOUS | Status: DC
  Administered 2013-09-02: 250 mL via INTRAVENOUS
  Filled 2013-09-02: qty 250

## 2013-09-02 MED ORDER — CLINIMIX E/DEXTROSE (5/15) 5 % IV SOLN
INTRAVENOUS | Status: DC
  Administered 2013-09-02: 18:00:00 via INTRAVENOUS
  Filled 2013-09-02: qty 2000

## 2013-09-02 NOTE — Progress Notes (Signed)
PARENTERAL NUTRITION CONSULT NOTE - FOLLOW UP  Pharmacy Consult for TPN Indication: intolerance to enteral feeding  Allergies  Allergen Reactions  . Celecoxib Other (See Comments)    unknown  . Glucosamine     unknown  . Metoclopramide Hcl Other (See Comments)    shaking  . Niacin Other (See Comments)    shaking  . 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 Phenazopyridine Hcl Other (See Comments)    unknown  . Sulfonamide Derivatives Nausea And Vomiting  . Trovan [Alatrofloxacin Mesylate] Other (See Comments)    Caused shaking and nervousness  . Benzocaine-Resorcinol Rash  . Sulfa Antibiotics Rash    Patient Measurements: Height: '5\' 2"'  (157.5 cm) Weight: 162 lb 11.2 oz (73.8 kg) IBW/kg (Calculated) : 50.1  Vital Signs: Temp: 98.6 F (37 C) (04/06 0510) Temp src: Oral (04/06 0510) BP: 124/68 mmHg (04/06 0510) Pulse Rate: 94 (04/06 0510) Intake/Output from previous day: 04/05 0701 - 04/06 0700 In: 3880 [P.O.:1500; I.V.:420; IV Piggyback:300; WUJ:8119] Out: 2350 [Urine:2350] Intake/Output from this shift:    Labs:  Recent Labs  08/31/13 0600 09/01/13 0445 09/02/13 0510  WBC 6.7 7.1 6.4  HGB 10.5* 11.3* 10.8*  HCT 32.1* 34.9* 33.7*  PLT 135* 143* 141*     Recent Labs  08/31/13 0600 09/01/13 0445 09/02/13 0510  NA 140 140 142  K 4.1 4.3 4.3  CL 104 103 105  CO2 '20 21 21  ' GLUCOSE 114* 119* 116*  BUN 25* 24* 26*  CREATININE 0.66 0.71 0.71  CALCIUM 8.6 9.0 8.9  MG  --   --  2.3  PHOS  --   --  4.7*  PROT 6.2 6.7 6.3  ALBUMIN 2.8* 2.9* 2.7*  AST 64* 41* 37  ALT 59* 68* 59*  ALKPHOS 194* 229* 228*  BILITOT 0.5 0.4 0.4  TRIG  --   --  260*   Estimated Creatinine Clearance: 58 ml/min (by C-G formula based on Cr of 0.71).   Insulin Requirements in the past 24 hours:  CBGs and SSI stopped 3/27  Current Nutrition:  -Clinimix E 5/15 at 79m/hr + 20% lipid emulsion at 133mhr on MWF only-provides average of  100g protein and 1620 kCal daily -Full liquid diet but little PO intake is being documented  Nutritional Goals:  1650-1850 kCal, 90-110 grams of protein per day per RD recs 4/1  Assessment:   [age]OF with pancreatic cancer s/p Whipple procedure on 3/11. She has experienced significant post-operative nausea and vomiting. Diet advanced to full liquids 3/30 and nausea seems to be somewhat better but not further advancing diet yet   GI: s/p Whipple procedure 3/11.  Has had difficulties with nausea and has been intolerant of PO intake.  NGT d/c 3/25.  Upper GI 3/26 demonstrated patency of gastrojejunostomy. Pt states that she is hungry but nauseous and unable to keep anything down. Prealbumin trending up to 27.3 (normal). Advanced to soft diet. Tolerating diet but eating very little today. Still remains nauseous. Plan is to d/c TNA tomorrow if no n/v   Endo: A1c 5.2 this admission - CBGs excellent, TSH WNL in January, synthroid  Lytes: Phos 4.7, all other lytes wnl   Renal: Stable renal fxn, 1/2 NS [id number] RA  Cards: Hx HTN, HLD, MV prolapse - BP 112/62, HR 102 - atenolol   Hepatobil: LFTs slightly increasing, Alk Phos 228, Tbili WNL, trigs down to 260  Neuro: Hx  anxiety, depression - gabapentin, fluoxetine, topiramate  ID: Afebrile, WBC WNL, no abx  Best Practices: SQ heparin, PPI PO  TPN Access: PICC placed 3/18  TPN day#: 20 (started 3/18)  Plan:  1. Decrease Clinimix E 5/15 to 60 mL/hr + 20% lipid emulsion at 25m/hr on M/W/F which will provide daily average of 78 g protein and 1502 kCal. This in addition to PO diet should provide 100% of nutritional goal.  2. On PO MVI so no MVI or TE added to TPN 3. F/u PO intake and ability to wean TPN tomorrow  4. F/u AM labs  BAlbertina Parr PharmD.  Clinical Pharmacist Pager [phone]

## 2013-09-02 NOTE — Progress Notes (Signed)
OT Cancellation Note  Patient Details Name: DANY HARTEN MRN: [id number] DOB: 1941/05/29   Cancelled Treatment:    Reason Eval/Treat Not Completed: Other (comment) (Pt with upset stomach.)  Benito Mccreedy OTR/L [phone] 09/02/2013, 1:55 PM

## 2013-09-02 NOTE — Progress Notes (Signed)
Patient ID: [name], female   DOB: 1940-11-05, 73 y.o.   MRN: [id number] 26 Days Post-Op  Subjective: No vomiting, but still using a lot of antiemetics.  Tolerating soft diet, eating pancakes this am.    Objective: Vital signs in last 24 hours: Temp:  [98.1 F (36.7 C)-98.6 F (37 C)] 98.6 F (37 C) (04/06 0510) Pulse Rate:  [94-96] 94 (04/06 0510) Resp:  [17-18] 18 (04/06 0510) BP: (113-124)/(56-68) 124/68 mmHg (04/06 0510) SpO2:  [98 %-99 %] 99 % (04/06 0510) Last BM Date: 08/31/13  Intake/Output from previous day: 04/05 0701 - 04/06 0700 In: 3880 [P.O.:1500; I.V.:420; IV Piggyback:300; TPN:1660] Out: 2350 [Urine:2350] Intake/Output this shift:    General appearance: alert, cooperative and no distress GI: normal findings: soft, non-tender and non-distended  Lab Results:   Recent Labs  09/01/13 0445 09/02/13 0510  WBC 7.1 6.4  HGB 11.3* 10.8*  HCT 34.9* 33.7*  PLT 143* 141*   BMET  Recent Labs  09/01/13 0445 09/02/13 0510  NA 140 142  K 4.3 4.3  CL 103 105  CO2 21 21  GLUCOSE 119* 116*  BUN 24* 26*  CREATININE 0.71 0.71  CALCIUM 9.0 8.9     Studies/Results: Dg Chest Port 1 View  09/02/2013   CLINICAL DATA:  Status post fall 3 days ago, with worsening left-sided chest pain and tenderness.  EXAM: PORTABLE CHEST - 1 VIEW  COMPARISON:  Chest radiograph performed 07/31/2013  FINDINGS: The lungs are hypoexpanded, with elevation of the right hemidiaphragm. Mild bibasilar atelectasis is noted. No pleural effusion or pneumothorax is seen.  The cardiomediastinal silhouette remains normal in size. No acute osseous abnormalities are seen. There is a chronic healed left seventh posterolateral rib fracture. A right PICC is noted ending about the distal SVC.  IMPRESSION: Lungs hypoexpanded, with elevation of the right hemidiaphragm. Mild bibasilar atelectasis noted. No displaced rib fracture seen.   Electronically Signed   By: Garald Balding M.D.   On: 09/02/2013 00:53     Anti-infectives: Anti-infectives   Start     Dose/Rate Route Frequency Ordered Stop   08/16/13 1000  metroNIDAZOLE (FLAGYL) IVPB 500 mg  Status:  Discontinued     500 mg 100 mL/hr over 60 Minutes Intravenous Every 8 hours 08/16/13 0854 08/22/13 0957   08/15/13 0900  ciprofloxacin (CIPRO) IVPB 400 mg  Status:  Discontinued     400 mg 200 mL/hr over 60 Minutes Intravenous Every 12 hours 08/15/13 0752 08/22/13 0957   08/08/13 0800  ertapenem (INVANZ) 1 g in sodium chloride 0.9 % 50 mL IVPB     1 g 100 mL/hr over 30 Minutes Intravenous Every 24 hours 08/07/13 1559 08/08/13 0913   08/07/13 0600  ertapenem (INVANZ) 1 g in sodium chloride 0.9 % 50 mL IVPB     1 g 100 mL/hr over 30 Minutes Intravenous On call to O.R. 08/06/13 1431 08/07/13 0829      Assessment/Plan: s/p Procedure(s): LAPAROSCOPY DIAGNOSTIC WHIPPLE PROCEDURE Apparent delayed gastric emptying, seems to be slowly improving. Soft diet.   If no n/v today or tomorrow, will d/c tna tomorrow.     LOS: 26 days    [name] 09/02/2013

## 2013-09-02 NOTE — Progress Notes (Signed)
Physical Therapy Treatment Patient Details Name: [name] MRN: [id number] DOB: 1940-08-17 Today's Date: 09-18-13    History of Present Illness admitted with adenocarcinoma of the pancreatic head, s/p whipple procedure, stenting and diagnostic lap.    PT Comments    Patient progressing with mobility, continues to complain of nausea with mobility. Educated patient on safety with use of RW and recommend continued use.  Follow Up Recommendations  SNF;Supervision for mobility/OOB     Equipment Recommendations  None recommended by PT    Recommendations for Other Services       Precautions / Restrictions Precautions Precautions: Fall Precaution Comments: adominal wounds;     Mobility  Bed Mobility               General bed mobility comments: not assessed, pt rec in chair  Transfers Overall transfer level: Needs assistance Equipment used: Rolling walker (2 wheeled)   Sit to Stand: Supervision         General transfer comment: Cues for safety   Ambulation/Gait Ambulation/Gait assistance: Min guard Ambulation Distance (Feet): 180 Feet Assistive device: Rolling walker (2 wheeled) Gait Pattern/deviations: Decreased stride length;Step-through pattern Gait velocity: decreased Gait velocity interpretation: Below normal speed for age/gender General Gait Details: 2 standing rest breaks during ambulation, patient reports increased nausea during activity   Stairs            Wheelchair Mobility    Modified Rankin (Stroke Patients Only)       Balance                                    Cognition Arousal/Alertness: Awake/alert Behavior During Therapy: WFL for tasks assessed/performed;Anxious Overall Cognitive Status: Within Functional Limits for tasks assessed                 General Comments: patient with increased anxiety today during activity, increased cues for encouragement    Exercises      General Comments         Pertinent Vitals/Pain Patient reports aching pain but does not quantify this, reports same when moving vs at rest (left abdomen)    Home Living                      Prior Function            PT Goals (current goals can now be found in the care plan section) Acute Rehab PT Goals Patient Stated Goal: none stated PT Goal Formulation: With patient Time For Goal Achievement: 08/23/13 Potential to Achieve Goals: Good Progress towards PT goals: Progressing toward goals    Frequency  Min 3X/week    PT Plan Current plan remains appropriate       End of Session Equipment Utilized During Treatment: Gait belt Activity Tolerance: Patient tolerated treatment well Patient left: in chair;with call bell/phone within reach;with family/visitor present     Time: 1206-1226 PT Time Calculation (min): 20 min  Charges:  $Gait Training: 8-22 mins                    G CodesDuncan [name] 09-18-13, 1:02 PM [name], [place] DPT  [phone]

## 2013-09-03 ENCOUNTER — Telehealth (INDEPENDENT_AMBULATORY_CARE_PROVIDER_SITE_OTHER): Payer: Self-pay | Admitting: General Surgery

## 2013-09-03 LAB — BASIC METABOLIC PANEL WITH GFR
BUN: 27 mg/dL — ABNORMAL HIGH (ref 6–23)
CO2: 23 meq/L (ref 19–32)
Calcium: 8.5 mg/dL (ref 8.4–10.5)
Chloride: 103 meq/L (ref 96–112)
Creatinine, Ser: 0.7 mg/dL (ref 0.50–1.10)
GFR calc Af Amer: >90 mL/min (ref >90)
GFR calc non Af Amer: 83 mL/min — ABNORMAL LOW (ref >90)
Glucose, Bld: 106 mg/dL — ABNORMAL HIGH (ref 70–99)
Potassium: 4.2 meq/L (ref 3.7–5.3)
Sodium: 140 meq/L (ref 137–147)

## 2013-09-03 MED ORDER — FAMOTIDINE 20 MG PO TABS
20.0000 mg | ORAL_TABLET | Freq: Two times a day (BID) | ORAL | Status: DC
  Administered 2013-09-03 – 2013-09-04 (×2): 20 mg via ORAL
  Filled 2013-09-03 (×3): qty 1

## 2013-09-03 NOTE — Telephone Encounter (Signed)
Roger returning call and message given.  He states that he's okay with the message and to call if you need to discuss in more detail.  [phone] Roger)

## 2013-09-03 NOTE — Progress Notes (Signed)
Patient ID: [name], female   DOB: June 26, 1940, 73 y.o.   MRN: [id number] 27 Days Post-Op   Subjective: One episode of small volume emesis.  Tolerating some food.  Objective: Vital signs in last 24 hours: Temp:  [97.9 F (36.6 C)-98.1 F (36.7 C)] 98.1 F (36.7 C) (04/07 0507) Pulse Rate:  [83-92] 84 (04/07 0507) Resp:  [18-19] 18 (04/07 0507) BP: (110-122)/(55-58) 113/56 mmHg (04/07 0507) SpO2:  [95 %-97 %] 95 % (04/07 0507) Last BM Date: 08/31/13  Intake/Output from previous day: 04/06 0701 - 04/07 0700 In: 1511.7 [P.O.:60; I.V.:461.7; IV Piggyback:150; TPN:840] Out: -  Intake/Output this shift:    General appearance: alert, cooperative and no distress GI: normal findings: soft, non-tender and non-distended  Lab Results:   Recent Labs  09/01/13 0445 09/02/13 0510  WBC 7.1 6.4  HGB 11.3* 10.8*  HCT 34.9* 33.7*  PLT 143* 141*   BMET  Recent Labs  09/02/13 0510 09/03/13 0520  NA 142 140  K 4.3 4.2  CL 105 103  CO2 21 23  GLUCOSE 116* 106*  BUN 26* 27*  CREATININE 0.71 0.70  CALCIUM 8.9 8.5     Studies/Results: Dg Chest Port 1 View  09/02/2013   CLINICAL DATA:  Status post fall 3 days ago, with worsening left-sided chest pain and tenderness.  EXAM: PORTABLE CHEST - 1 VIEW  COMPARISON:  Chest radiograph performed 07/31/2013  FINDINGS: The lungs are hypoexpanded, with elevation of the right hemidiaphragm. Mild bibasilar atelectasis is noted. No pleural effusion or pneumothorax is seen.  The cardiomediastinal silhouette remains normal in size. No acute osseous abnormalities are seen. There is a chronic healed left seventh posterolateral rib fracture. A right PICC is noted ending about the distal SVC.  IMPRESSION: Lungs hypoexpanded, with elevation of the right hemidiaphragm. Mild bibasilar atelectasis noted. No displaced rib fracture seen.   Electronically Signed   By: Garald Balding M.D.   On: 09/02/2013 00:53    Anti-infectives: Anti-infectives   Start      Dose/Rate Route Frequency Ordered Stop   08/16/13 1000  metroNIDAZOLE (FLAGYL) IVPB 500 mg  Status:  Discontinued     500 mg 100 mL/hr over 60 Minutes Intravenous Every 8 hours 08/16/13 0854 08/22/13 0957   08/15/13 0900  ciprofloxacin (CIPRO) IVPB 400 mg  Status:  Discontinued     400 mg 200 mL/hr over 60 Minutes Intravenous Every 12 hours 08/15/13 0752 08/22/13 0957   08/08/13 0800  ertapenem (INVANZ) 1 g in sodium chloride 0.9 % 50 mL IVPB     1 g 100 mL/hr over 30 Minutes Intravenous Every 24 hours 08/07/13 1559 08/08/13 0913   08/07/13 0600  ertapenem (INVANZ) 1 g in sodium chloride 0.9 % 50 mL IVPB     1 g 100 mL/hr over 30 Minutes Intravenous On call to O.R. 08/06/13 1431 08/07/13 0829      Assessment/Plan: s/p Procedure(s): LAPAROSCOPY DIAGNOSTIC WHIPPLE PROCEDURE Apparent delayed gastric emptying, seems to be slowly improving. Wean TNA to off. Ambulate.    LOS: 27 days    Vaden Becherer 09/03/2013

## 2013-09-03 NOTE — Telephone Encounter (Signed)
Left message about progress and potential discharge dates.

## 2013-09-03 NOTE — Progress Notes (Signed)
Occupational Therapy Treatment Patient Details Name: [name] MRN: [id number] DOB: Mar 19, 1941 Today's Date: 09/03/2013    History of present illness admitted with adenocarcinoma of the pancreatic head, s/p whipple procedure, stenting and diagnostic lap.   OT comments  Agreeable to participation in skilled OT.  States nausea still present but has not recently vomited.  Able to demonstrate safe functional transfers in room and amb. In room with no LOB or safety concerns noted.  States she has all needed DME at home.  Declined practice with AE for LB self care stating she has been using it for years.    Follow Up Recommendations  SNF, pt. Vocalized concerns for how she will obtain meds and groceries when she returns home.  Will need follow up on this from final venue of care prior to d/c home.     Equipment Recommendations  None recommended by OT    Recommendations for Other Services      Precautions / Restrictions Precautions Precautions: Fall Precaution Comments: adominal wounds;        Mobility Bed Mobility Pt. Up in chair upon arrival                  Transfers Overall transfer level: Needs assistance Equipment used: Rolling walker (2 wheeled) Transfers: Sit to/from Stand Sit to Stand: Supervision Stand pivot transfers: Supervision                                               ADL Overall ADL's : Needs assistance/impaired     Grooming: Supervision/safety;Standing;Wash/dry hands                 Lower Body Dressing Details (indicate cue type and reason): declined practice but states she has AE kit at home from previous surgeries and feels confident in use of Toilet Transfer: Supervision/safety Toilet Transfer Details (indicate cue type and reason): simulated transfer in b.room and to/from recliner.  S all aspects     Tub/ Shower Transfer: Tub transfer;Supervision/safety;Tub bench Tub/Shower Transfer Details (indicate cue type and  reason): pt. able to demonstrate safe sim. tub transfer with use of chair in room.  able to bring BLES over stated tub height in/out Functional mobility during ADLs: Supervision/safety;Rolling walker General ADL Comments: reports she has all needed DME from previous surgeries                                                                                                    Pertinent Vitals/ Pain    denies pain, only c/o nausea                                                           Frequency Min 2X/week     Progress Toward Goals  OT Goals(current  goals can now be found in the care plan section)  Progress towards OT goals: Progressing toward goals     Plan Discharge plan remains appropriate                     End of Session Equipment Utilized During Treatment: Rolling walker   Activity Tolerance Patient tolerated treatment well   Patient Left in chair;with call bell/phone within reach   Nurse Communication          Time: 0944-1000 OT Time Calculation (min): 16 min  Charges: OT General Charges $OT Visit: 1 Procedure OT Treatments $Self Care/Home Management : 8-22 mins  Janice Coffin, COTA/L 09/03/2013, 10:37 AM

## 2013-09-04 ENCOUNTER — Telehealth (INDEPENDENT_AMBULATORY_CARE_PROVIDER_SITE_OTHER): Payer: Self-pay | Admitting: General Surgery

## 2013-09-04 MED ORDER — ENSURE COMPLETE PO LIQD
237.0000 mL | Freq: Two times a day (BID) | ORAL | Status: DC
  Administered 2013-09-04: 237 mL via ORAL

## 2013-09-04 MED ORDER — ENSURE COMPLETE PO LIQD
237.0000 mL | ORAL | Status: DC
  Administered 2013-09-06 – 2013-09-10 (×5): 237 mL via ORAL

## 2013-09-04 MED ORDER — PROMETHAZINE HCL 25 MG PO TABS
12.5000 mg | ORAL_TABLET | Freq: Four times a day (QID) | ORAL | Status: DC | PRN
  Administered 2013-09-04 – 2013-09-09 (×7): 12.5 mg via ORAL
  Filled 2013-09-04 (×8): qty 1

## 2013-09-04 MED ORDER — PROMETHAZINE HCL 25 MG RE SUPP
12.5000 mg | Freq: Four times a day (QID) | RECTAL | Status: DC | PRN
  Administered 2013-09-05: 12.5 mg via RECTAL
  Filled 2013-09-04: qty 1

## 2013-09-04 MED ORDER — ENSURE PUDDING PO PUDG
1.0000 | ORAL | Status: DC
  Administered 2013-09-04 – 2013-09-09 (×5): 1 via ORAL

## 2013-09-04 NOTE — Progress Notes (Signed)
Patient ID: [name], female   DOB: 08-27-1940, 73 y.o.   MRN: [id number] 28 Days Post-Op   Subjective: No emesis.  No diarrhea.  Still having some nausea.    Objective: Vital signs in last 24 hours: Temp:  [98 F (36.7 C)-98.4 F (36.9 C)] 98.4 F (36.9 C) (04/08 5621) Pulse Rate:  [70-89] 70 (04/08 0954) Resp:  [18] 18 (04/08 0614) BP: (92-127)/(42-57) 113/53 mmHg (04/08 0954) SpO2:  [97 %-98 %] 97 % (04/08 0614) Last BM Date: 09/03/13  Intake/Output from previous day: 04/07 0701 - 04/08 0700 In: 897.7 [P.O.:420; I.V.:477.7] Out: 200 [Urine:200] Intake/Output this shift: Total I/O In: -  Out: 200 [Urine:200]  General appearance: alert, cooperative and no distress GI: normal findings: soft, non-tender and non-distended  Lab Results:   Recent Labs  09/02/13 0510  WBC 6.4  HGB 10.8*  HCT 33.7*  PLT 141*   BMET  Recent Labs  09/02/13 0510 09/03/13 0520  NA 142 140  K 4.3 4.2  CL 105 103  CO2 21 23  GLUCOSE 116* 106*  BUN 26* 27*  CREATININE 0.71 0.70  CALCIUM 8.9 8.5     Studies/Results: No results found.  Anti-infectives: Anti-infectives   Start     Dose/Rate Route Frequency Ordered Stop   08/16/13 1000  metroNIDAZOLE (FLAGYL) IVPB 500 mg  Status:  Discontinued     500 mg 100 mL/hr over 60 Minutes Intravenous Every 8 hours 08/16/13 0854 08/22/13 0957   08/15/13 0900  ciprofloxacin (CIPRO) IVPB 400 mg  Status:  Discontinued     400 mg 200 mL/hr over 60 Minutes Intravenous Every 12 hours 08/15/13 0752 08/22/13 0957   08/08/13 0800  ertapenem (INVANZ) 1 g in sodium chloride 0.9 % 50 mL IVPB     1 g 100 mL/hr over 30 Minutes Intravenous Every 24 hours 08/07/13 1559 08/08/13 0913   08/07/13 0600  ertapenem (INVANZ) 1 g in sodium chloride 0.9 % 50 mL IVPB     1 g 100 mL/hr over 30 Minutes Intravenous On call to O.R. 08/06/13 1431 08/07/13 0829      Assessment/Plan: s/p Procedure(s): LAPAROSCOPY DIAGNOSTIC WHIPPLE PROCEDURE Apparent delayed  gastric emptying, seems to be slowly improving.  Will try to switch to PO antiemetics.  Hopefully to SNF later this week.    Ambulate.   LOS: 28 days    [name] 09/04/2013

## 2013-09-04 NOTE — Progress Notes (Signed)
NUTRITION FOLLOW UP  Intervention:    Nutritional supplements: Ensure Complete, Magic Cup once, and Ensure Pudding once daily to determine pt preference  Calorie Count, 48 hours  RD to follow nutrition Care Plan  Nutrition Dx:   Malnutrition related to altered GI function as evidenced by intake </= 50% of estimated energy requirement for >/= 5 days and mild loss of muscle mass, ongoing  Goal:   Intake to meet >90% of estimated nutrition needs; not currently being met  Monitor:   TPN adequacy/tolerance, diet advancement/tolerance, PO intake, labs, weight trend.  Assessment:   Patient presented to the hospital on 3/11 for diagnostic laparoscopy and Whipple procedure for pancreatic cancer.  Patient was NPO or on clear liquids for the first 5 days of admission. Diet advanced to full liquids on 3/17. Patient has had a lot of nausea and vomiting, not tolerating full liquid diet. TPN was initiated 3/18.  Patient reports nausea and vomiting since surgery last week. She has been unable to keep down any liquids this morning. Patient reports good appetite and good oral intake with stable weight PTA.   Pt's diet was advanced to Soft Diet on 4/5 and TPN has been discontinued. Pt reports ongoing decreased appetite and some nausea at times. She states she has been eating about 50% of her meals and drinking half of a Boost Breeze supplement daily. Calorie count now in progress. Encouraged PO intake and use of nutritional supplements to help pt receive adequate nutrition.  Using bed scale at time of visit 4/1- pt's weight was 153 lbs- 9 lbs weight loss.   Height: Ht Readings from Last 1 Encounters:  08/10/13 _0  (1.575 m)    Weight Status:   Wt Readings from Last 1 Encounters:  08/10/13 162 lb 11.2 oz (73.8 kg)    Re-estimated needs:  Kcal: 1650-1850  Protein: 85-100 gm  Fluid: 1.7-1.9 L  Skin: abdominal incision; +1 generalized edema  Diet Order: Criss Rosales   Intake/Output Summary (Last  24 hours) at 09/04/13 1428 Last data filed at 09/04/13 1423  Gross per 24 hour  Intake 897.66 ml  Output    400 ml  Net 497.66 ml    Last BM: 3/30   Labs:   Recent Labs Lab 08/29/13 0410  09/01/13 0445 09/02/13 0510 09/03/13 0520  NA 137  < > 140 142 140  K 4.3  < > 4.3 4.3 4.2  CL 102  < > 103 105 103  CO2 21  < > _1 BUN 27*  < > 24* 26* 27*  CREATININE 0.77  < > 0.71 0.71 0.70  CALCIUM 8.6  < > 9.0 8.9 8.5  MG 2.1  --   --  2.3  --   PHOS 4.4  --   --  4.7*  --   GLUCOSE 127*  < > 119* 116* 106*  < > = values in this interval not displayed.  CBG (last 3)  No results found for this basename: GLUCAP,  in the last 72 hours  Scheduled Meds: . atenolol  25 mg Oral q morning - 10a  . bisacodyl  10 mg Rectal Daily  . feeding supplement (ENSURE COMPLETE)  237 mL Oral BID BM  . feeding supplement (RESOURCE BREEZE)  1 Container Oral Q24H  . FLUoxetine  20 mg Oral Daily  . gabapentin  200 mg Oral TID AC  . gabapentin  300 mg Oral QHS  . heparin  5,000 Units Subcutaneous 3 times  per day  . levothyroxine  175 mcg Oral QAC breakfast  . multivitamin with minerals  1 tablet Oral Q breakfast  . pantoprazole  40 mg Oral QHS  . polyvinyl alcohol  1 drop Both Eyes BID  . topiramate  200 mg Oral BID  . vitamin E  800 Units Oral BID    Continuous Infusions: . sodium chloride 20 mL/hr (09/02/13 1831)   Pryor Ochoa RD, LDN Inpatient Clinical Dietitian Pager: [phone] After Hours Pager: [phone]

## 2013-09-04 NOTE — Progress Notes (Signed)
  CSW spoke with Pt's son Reana Chacko [phone] cell for d/c planning.   Mr. Picazo is agreeable to Pt d/c'ing to [facility] on day of d/c.   CSW spoke with Narda Rutherford at [facility] and they did receive update clinicals.   Narda Rutherford will have nurse look at clinicals and contact CSW back if there are any concerns.    CSW will continue to follow Pt for d/c planning.    [facility]  4N 1[address]16;  [phone] Phone: [phone]

## 2013-09-04 NOTE — Progress Notes (Signed)
  CSW notified of Pt possible d/c to facility ([facility]) on Friday. CSW update Janie at [facility] of possible d/c and gave new clinicals.   CSW will continue to follow Pt for d/c planning.    [facility]  4N 1[address]16;  [phone] Phone: [phone]

## 2013-09-04 NOTE — Telephone Encounter (Signed)
Discussed mother's status.   Will hopefully plan for d/c Friday.    Will see how she is tomorrow.

## 2013-09-04 NOTE — Progress Notes (Signed)
Seen and agreed 09/04/2013 Julia Elizabeth Robinette PTA [phone] pager [phone] office    

## 2013-09-04 NOTE — Progress Notes (Signed)
Physical Therapy Treatment Patient Details Name: [name] MRN: [id number] DOB: 1940/10/14 Today's Date: 09/04/2013    History of Present Illness admitted with adenocarcinoma of the pancreatic head, s/p whipple procedure, stenting and diagnostic lap.    PT Comments    Upon entering room patient was motivated to perform PT. Once patient performed bed mobility and sit to stand transfer patient reports abdominal pain and nausea. Increased time due to nausea and fatigue for today's session. Continue to recommend SNF to improve stamina for ADLs. Patient's son spoke with MD and stated d/c planned for Friday this week per nausea and eating improvements.    Follow Up Recommendations  SNF;Supervision for mobility/OOB     Equipment Recommendations  None recommended by PT    Recommendations for Other Services       Precautions / Restrictions Precautions Precautions: Fall Precaution Comments: adominal wounds;  Restrictions Weight Bearing Restrictions: No    Mobility  Bed Mobility Overal bed mobility: Modified Independent Bed Mobility: Supine to Sit;Sit to Supine     Supine to sit: Supervision Sit to supine: Supervision   General bed mobility comments: Patient able to perform bed mobility with supervision for safety.  Transfers Overall transfer level: Needs assistance Equipment used: Rolling walker (2 wheeled) Transfers: Sit to/from Stand Sit to Stand: Supervision Stand pivot transfers: Supervision       General transfer comment: A for IV pole  Ambulation/Gait Ambulation/Gait assistance: Min guard Ambulation Distance (Feet): 10 Feet Assistive device: Rolling walker (2 wheeled) Gait Pattern/deviations: Decreased stride length;Step-through pattern Gait velocity: decreased   General Gait Details: Patient performed limited gait training due to increased nausea during amb   Stairs            Wheelchair Mobility    Modified Rankin (Stroke Patients Only)        Balance Overall balance assessment: Needs assistance Sitting-balance support: Feet supported Sitting balance-Leahy Scale: Good Sitting balance - Comments: Patient able to sit EOB without UE support   Standing balance support: Single extremity supported Standing balance-Leahy Scale: Fair Standing balance comment: Patient able to perform ADLs at counter with single extremity support in standing position                    Cognition Arousal/Alertness: Awake/alert Behavior During Therapy: [facility] for tasks assessed/performed;Anxious Overall Cognitive Status: Within Functional Limits for tasks assessed                      Exercises      General Comments        Pertinent Vitals/Pain Patient c/o nausea. Patient able to have BM and was returned to bed.    Home Living                      Prior Function            PT Goals (current goals can now be found in the care plan section) Progress towards PT goals: Not progressing toward goals - comment (patient continues to be limited by nausea and unable to progress towards goals)    Frequency  Min 3X/week    PT Plan Current plan remains appropriate    Co-evaluation             End of Session Equipment Utilized During Treatment: Gait belt Activity Tolerance: Patient limited by fatigue (Patient c/o fatigue and nausea ) Patient left: in bed;with call bell/phone within reach     Time:  4431-5400 PT Time Calculation (min): 27 min  Charges:                       G Codes:      [place], SPTA 09/04/2013, 11:17 AM

## 2013-09-05 ENCOUNTER — Other Ambulatory Visit: Payer: Medicare Other

## 2013-09-05 ENCOUNTER — Ambulatory Visit: Payer: Medicare Other | Admitting: Oncology

## 2013-09-05 MED ORDER — PANCRELIPASE (LIP-PROT-AMYL) 12000-38000 UNITS PO CPEP
1.0000 | ORAL_CAPSULE | Freq: Three times a day (TID) | ORAL | Status: DC
  Administered 2013-09-06 – 2013-09-10 (×13): 1 via ORAL
  Filled 2013-09-05 (×19): qty 1

## 2013-09-05 NOTE — Progress Notes (Signed)
Patient ID: [name], female   DOB: 04-09-1941, 73 y.o.   MRN: [id number] 29 Days Post-Op   Subjective: Still no further emesis.  Continues to have nausea.  Ate ok, but not great yesterday.  Looks like only around 40% of caloric needs, but haven't seen numbers on calorie count.    Objective: Vital signs in last 24 hours: Temp:  [98.4 F (36.9 C)-98.5 F (36.9 C)] 98.4 F (36.9 C) (04/08 2100) Pulse Rate:  [70-89] 88 (04/08 2100) Resp:  [17-18] 17 (04/08 2100) BP: (92-132)/(42-61) 132/61 mmHg (04/08 2100) SpO2:  [95 %-97 %] 96 % (04/08 2100) Last BM Date: 09/04/13  Intake/Output from previous day: 04/08 0701 - 04/09 0700 In: 819.3 [P.O.:480; I.V.:339.3] Out: 1150 [Urine:1150] Intake/Output this shift: Total I/O In: 699.3 [P.O.:360; I.V.:339.3] Out: 700 [Urine:700]  General appearance: alert, cooperative and no distress GI: normal findings: soft, non-tender and non-distended Neuro:  Involuntary movements from tardive dyskinesia.    Lab Results:  No results found for this basename: WBC, HGB, HCT, PLT,  in the last 72 hours BMET  Recent Labs  09/03/13 0520  NA 140  K 4.2  CL 103  CO2 23  GLUCOSE 106*  BUN 27*  CREATININE 0.70  CALCIUM 8.5     Studies/Results: No results found.  Anti-infectives: Anti-infectives   Start     Dose/Rate Route Frequency Ordered Stop   08/16/13 1000  metroNIDAZOLE (FLAGYL) IVPB 500 mg  Status:  Discontinued     500 mg 100 mL/hr over 60 Minutes Intravenous Every 8 hours 08/16/13 0854 08/22/13 0957   08/15/13 0900  ciprofloxacin (CIPRO) IVPB 400 mg  Status:  Discontinued     400 mg 200 mL/hr over 60 Minutes Intravenous Every 12 hours 08/15/13 0752 08/22/13 0957   08/08/13 0800  ertapenem (INVANZ) 1 g in sodium chloride 0.9 % 50 mL IVPB     1 g 100 mL/hr over 30 Minutes Intravenous Every 24 hours 08/07/13 1559 08/08/13 0913   08/07/13 0600  ertapenem (INVANZ) 1 g in sodium chloride 0.9 % 50 mL IVPB     1 g 100 mL/hr over 30  Minutes Intravenous On call to O.R. 08/06/13 1431 08/07/13 0829      Assessment/Plan: s/p Procedure(s): LAPAROSCOPY DIAGNOSTIC WHIPPLE PROCEDURE Apparent delayed gastric emptying, seems to be slowly improving.   Pt diet improving, but still poor.  Do not anticipate that she would improve adequately by tomorrow. Will also add a low dose of creon for the bothersome gas she is having.   Would wait until Monday for d/c.  Would like to continue calorie count.   Ambulate.   LOS: 29 days    [name] 09/05/2013

## 2013-09-05 NOTE — Progress Notes (Signed)
  CSW reviewed chart and per MD note Pt may not be ready until Monday.  CSW will follow and assist with d/c when medically ready.    [facility]  4N 1[address]16;  [phone] Phone: [phone]

## 2013-09-05 NOTE — Progress Notes (Cosign Needed)
BSW Intern called Janie from [facility] to give an update on Pt. [name] was not available so BSW Intern left a message.   CSW and BSW Intern will continue to follow Pt for d/c planning.

## 2013-09-05 NOTE — Progress Notes (Signed)
Calorie Count Note  48 hour calorie count ordered.  Diet: Soft Diet Supplements: Ensure Pudding, Ensure Complete, Magic Cup  Breakfast 4/9: none, pt not feeling well Lunch 4/8: 393 kcal, 14 grams protein Dinner 4/8: 100 kcal, 4 grams protein Supplements: 170 kcal, 4 grams protein  Total intake: 663 kcal (40% of minimum estimated needs)  22 grams protein (26% of minimum estimated needs)  Nutrition Dx: Malnutrition related to altered GI function as evidenced by intake </= 50% of estimated energy requirement for >/= 5 days and mild loss of muscle mass, ongoing   Goal: Pt to meet >/= 90% of their estimated nutrition needs; unmet  Intervention: Ensure Pudding and Ensure Complete once daily Provide Magic Cup BID Encourage PO intake Calorie Count 1 more day  Pryor Ochoa RD, LDN Inpatient Clinical Dietitian Pager: [phone] After Hours Pager: [phone]

## 2013-09-06 LAB — MRSA PCR SCREENING: MRSA by PCR: POSITIVE — AB

## 2013-09-06 MED ORDER — ONDANSETRON HCL 4 MG/2ML IJ SOLN
4.0000 mg | Freq: Four times a day (QID) | INTRAMUSCULAR | Status: DC | PRN
  Administered 2013-09-06 – 2013-09-12 (×7): 4 mg via INTRAVENOUS
  Filled 2013-09-06 (×9): qty 2

## 2013-09-06 MED ORDER — DRONABINOL 2.5 MG PO CAPS: 2.5000 mg | ORAL_CAPSULE | Freq: Every day | ORAL | Status: DC

## 2013-09-06 MED ORDER — DRONABINOL 2.5 MG PO CAPS
2.5000 mg | ORAL_CAPSULE | Freq: Every day | ORAL | Status: DC
  Administered 2013-09-06 – 2013-09-12 (×7): 2.5 mg via ORAL
  Filled 2013-09-06 (×7): qty 1

## 2013-09-06 MED ORDER — MUPIROCIN 2 % EX OINT
1.0000 "application " | TOPICAL_OINTMENT | Freq: Two times a day (BID) | CUTANEOUS | Status: AC
  Administered 2013-09-06 – 2013-09-10 (×10): 1 via NASAL
  Filled 2013-09-06: qty 22

## 2013-09-06 MED ORDER — CHLORHEXIDINE GLUCONATE CLOTH 2 % EX PADS
6.0000 | MEDICATED_PAD | Freq: Every day | CUTANEOUS | Status: AC
  Administered 2013-09-07 – 2013-09-11 (×5): 6 via TOPICAL

## 2013-09-06 NOTE — Progress Notes (Signed)
Patient ID: [name], female   DOB: 08/25/1940, 73 y.o.   MRN: [id number] 30 Days Post-Op   Subjective: Pt did have some emesis yesterday, small volume.  She is feeling better today and was still able to eat a fair amount yesterday, though still only around 40% of her needs.    Objective: Vital signs in last 24 hours: Temp:  [97.9 F (36.6 C)-98.5 F (36.9 C)] 97.9 F (36.6 C) (04/10 0506) Pulse Rate:  [88-93] 93 (04/10 0506) Resp:  [16-18] 16 (04/10 0506) BP: (115-131)/(43-65) 115/54 mmHg (04/10 0506) SpO2:  [94 %-96 %] 95 % (04/10 0506) Last BM Date: 09/05/13  Intake/Output from previous day: 04/09 0701 - 04/10 0700 In: 483 [I.V.:483] Out: 700 [Urine:600; Emesis/NG output:100] Intake/Output this shift: Total I/O In: -  Out: 300 [Urine:300]  General appearance: alert, cooperative and no distress GI: normal findings: soft, non-tender and non-distended Neuro:  Involuntary movements from tardive dyskinesia.    Lab Results:  No results found for this basename: WBC, HGB, HCT, PLT,  in the last 72 hours BMET No results found for this basename: NA, K, CL, CO2, GLUCOSE, BUN, CREATININE, CALCIUM,  in the last 72 hours   Studies/Results: No results found.  Anti-infectives: Anti-infectives   Start     Dose/Rate Route Frequency Ordered Stop   08/16/13 1000  metroNIDAZOLE (FLAGYL) IVPB 500 mg  Status:  Discontinued     500 mg 100 mL/hr over 60 Minutes Intravenous Every 8 hours 08/16/13 0854 08/22/13 0957   08/15/13 0900  ciprofloxacin (CIPRO) IVPB 400 mg  Status:  Discontinued     400 mg 200 mL/hr over 60 Minutes Intravenous Every 12 hours 08/15/13 0752 08/22/13 0957   08/08/13 0800  ertapenem (INVANZ) 1 g in sodium chloride 0.9 % 50 mL IVPB     1 g 100 mL/hr over 30 Minutes Intravenous Every 24 hours 08/07/13 1559 08/08/13 0913   08/07/13 0600  ertapenem (INVANZ) 1 g in sodium chloride 0.9 % 50 mL IVPB     1 g 100 mL/hr over 30 Minutes Intravenous On call to O.R.  08/06/13 1431 08/07/13 0829      Assessment/Plan: s/p Procedure(s): LAPAROSCOPY DIAGNOSTIC WHIPPLE PROCEDURE Apparent delayed gastric emptying, seems to be slowly improving.   Pt diet improving, but still poor.  Will continue calorie count. Zofran/phenergan/scopolamine for nausea. Added a low dose of creon for the bothersome gas she is having.   Would wait until Monday for d/c if diet intake is good then.   Ambulate.   LOS: 30 days    [name] 09/06/2013

## 2013-09-06 NOTE — Progress Notes (Signed)
Pt vomitted approximately 300ccs yellow/white emesis. Medicated per orders.

## 2013-09-06 NOTE — Progress Notes (Signed)
Seen and agreed 09/06/2013 Noelia Lenart Elizabeth Terence Bart PTA [phone] pager [phone] office    

## 2013-09-06 NOTE — Progress Notes (Signed)
Occupational Therapy Treatment Patient Details Name: [name] MRN: [id number] DOB: [birth year] Today's Date: 09/06/2013    History of present illness admitted with adenocarcinoma of the pancreatic head, s/p whipple procedure, stenting and diagnostic lap.   OT comments  Pt making good progress with functional goals and should continue with acute OT services to increase level of function and safety. Pt planning to d/c to SNF today or tomorrow   Follow Up Recommendations  SNF    Equipment Recommendations  None recommended by OT    Recommendations for Other Services      Precautions / Restrictions Precautions Precautions: Fall Precaution Comments: adominal wounds;  Restrictions Weight Bearing Restrictions: No       Mobility Bed Mobility               General bed mobility comments: patient in recliner upon entering room  Transfers Overall transfer level: Needs assistance Equipment used: Rolling walker (2 wheeled) Transfers: Sit to/from Stand Sit to Stand: Supervision              Balance Overall balance assessment: Needs assistance Sitting-balance support: No upper extremity supported;Feet supported Sitting balance-Leahy Scale: Good Sitting balance - Comments: Patient required A for safety    Standing balance support: Single extremity supported;Bilateral upper extremity supported;During functional activity Standing balance-Leahy Scale: Fair Standing balance comment: patient required UE support in standing balance c/o fatigue and nausea                   ADL       Grooming: Supervision/safety;Standing;Wash/dry hands;Wash/dry face;Oral care               Lower Body Dressing: Sit to/from stand;Sitting/lateral leans;Set up;Supervision/safety;Min guard Lower Body Dressing Details (indicate cue type and reason): pt has ADL A/E at home reports that she knows how to use Toilet Transfer: Supervision/safety;RW;Grab bars;Regular Toilet    Toileting- Clothing Manipulation and Hygiene: Supervision/safety;Sit to/from stand       Functional mobility during ADLs: Supervision/safety;Rolling walker General ADL Comments: Pt abe to stand at closet and sink to gather and place ADL/toiletry items in prep for ADLs with sup/min guard A using RW. Pt able to recall EC technique to stop and rest during acitivyt at sink                                      Cognition   Behavior During Therapy: [facility] for tasks assessed/performed Overall Cognitive Status: Within Functional Limits for tasks assessed                                                 General Comments  Pt pleasant and cooperative    Pertinent Vitals/ Pain       No c/o pain, reports abdominal discomfort, but no vomiting this session. VSS                                            Prior Functioning/Environment  independent           Frequency Min 2X/week     Progress Toward Goals  OT Goals(current goals can now be found in the care plan  section)  Progress towards OT goals: Progressing toward goals     Plan Discharge plan remains appropriate                     End of Session Equipment Utilized During Treatment: Rolling walker   Activity Tolerance Patient tolerated treatment well   Patient Left in chair;with call bell/phone within reach   Nurse Communication          Time: 1884-1660 OT Time Calculation (min): 24 min  Charges: OT General Charges $OT Visit: 1 Procedure OT Treatments $Self Care/Home Management : 23-37 mins  Mosetta Putt 09/06/2013, 12:44 PM

## 2013-09-06 NOTE — Progress Notes (Signed)
Calorie Count Note  48 hour calorie count ordered and now complete.  Diet: Soft Diet Supplements: Ensure Complete, Ensure Pudding, Magic Cup  Breakfast: ate some but, vomited afterward Lunch: no PO intake due to nausea Dinner: 182 kcal, 13 grams protein Supplements: 640 kcal, 22 grams of protein  Total intake: 822 kcal (50% of minimum estimated needs)  35 grams protein (41% of minimum estimated needs)  Pt unable to eat well due to nausea. She was able to consume 2 supplements- magic cup and Ensure Complete. Pt reports ongoing nausea today and states she feels it is due to medications.  Tenormin, topiramate, phenergan and oxycodone all have nausea (or N/V) listed as a possible side effect.   Nutrition Dx: Malnutrition related to altered GI function as evidenced by intake </= 50% of estimated energy requirement for >/= 5 days and mild loss of muscle mass; ongoing   Goal: Pt to meet >/= 90% of their estimated nutrition needs; not met  Intervention: Encourage PO intake (small frequent amounts) and use of supplements Provide Ensure Complete and Ensure Pudding once daily Provide Magic Cup ice cream 2-3 times daily  Pryor Ochoa RD, LDN Inpatient Clinical Dietitian Pager: [phone] After Hours Pager: [phone]

## 2013-09-06 NOTE — Progress Notes (Signed)
Physical Therapy Treatment Patient Details Name: [name] MRN: [id number] DOB: 17-May-1941 Today's Date: 09/06/2013    History of Present Illness admitted with adenocarcinoma of the pancreatic head, s/p whipple procedure, stenting and diagnostic lap.    PT Comments    Patient continues to be limited due to nausea. Patient progressing slowly progressing towards goals but requires A with safety. Patient is eager for pending d/c Monday but is worried about constant nausea and vomiting. Continue to recommend SNF at this time to help manage patient's needs as tolerated.    Follow Up Recommendations  SNF;Supervision for mobility/OOB     Equipment Recommendations  None recommended by PT    Recommendations for Other Services       Precautions / Restrictions Precautions Precautions: Fall Precaution Comments: adominal wounds;  Restrictions Weight Bearing Restrictions: No    Mobility  Bed Mobility               General bed mobility comments: patient in recliner pretherapy  Transfers                    Ambulation/Gait                 Stairs            Wheelchair Mobility    Modified Rankin (Stroke Patients Only)       Balance Overall balance assessment: Needs assistance Sitting-balance support: Feet supported Sitting balance-Leahy Scale: Good Sitting balance - Comments: Patient required A for safety    Standing balance support: Bilateral upper extremity supported Standing balance-Leahy Scale: Fair Standing balance comment: patient required UE support in standing balance c/o fatigue and nausea                    Cognition Arousal/Alertness: Awake/alert Behavior During Therapy: WFL for tasks assessed/performed;Anxious Overall Cognitive Status: Within Functional Limits for tasks assessed                      Exercises      General Comments        Pertinent Vitals/Pain Patient c/o nausea. Nursing staff was  notified.    Home Living                      Prior Function            PT Goals (current goals can now be found in the care plan section) Progress towards PT goals: Progressing toward goals    Frequency  Min 3X/week    PT Plan Current plan remains appropriate    Co-evaluation             End of Session Equipment Utilized During Treatment: Gait belt Activity Tolerance: Patient limited by fatigue (and nausea) Patient left: in chair;with call bell/phone within reach     Time: 1110-1127 PT Time Calculation (min): 17 min  Charges:                       G Codes:      [place], SPTA 09/06/2013, 12:08 PM

## 2013-09-07 MED ORDER — ERYTHROMYCIN BASE 250 MG PO TABS
250.0000 mg | ORAL_TABLET | Freq: Three times a day (TID) | ORAL | Status: DC
  Administered 2013-09-07 – 2013-09-12 (×12): 250 mg via ORAL
  Filled 2013-09-07 (×20): qty 1

## 2013-09-07 NOTE — Progress Notes (Signed)
31 Days Post-Op  Subjective: Stable and alert. In no distress. Trying to eat. Vomited about 8 ounces of yellow fluid this morning.  Calorie count yesterday was 50% of minimum estimated needs and 41% of minimum estimated protein intake.  Objective: Vital signs in last 24 hours: Temp:  [97.8 F (36.6 C)-98.2 F (36.8 C)] 97.8 F (36.6 C) (04/11 1352) Pulse Rate:  [82-106] 106 (04/11 1352) Resp:  [15-18] 18 (04/11 0456) BP: (96-126)/(47-66) 126/66 mmHg (04/11 1352) SpO2:  [93 %-100 %] 100 % (04/11 1352) Last BM Date: 09/05/13  Intake/Output from previous day: 04/10 0701 - 04/11 0700 In: 1038 [P.O.:702; I.V.:336] Out: 1050 [Urine:1050] Intake/Output this shift: Total I/O In: 10 [I.V.:10] Out: 350 [Urine:350]  General appearance: very pleasant. In no distress. Sitting at bedside trying to eat some food and vegetables. Mental status normal GI: soft. Nontender. Midline incision healing normally. Doesn't appear distended. Abdomen a little protuberant.. No significant tenderness.  Lab Results:  No results found for this or any previous visit (from the past 24 hour(s)).   Studies/Results: No results found.  Marland Kitchen atenolol  25 mg Oral q morning - 10a  . bisacodyl  10 mg Rectal Daily  . Chlorhexidine Gluconate Cloth  6 each Topical Q0600  . dronabinol  2.5 mg Oral QAC lunch  . feeding supplement (ENSURE COMPLETE)  237 mL Oral Q24H  . feeding supplement (ENSURE)  1 Container Oral Q24H  . FLUoxetine  20 mg Oral Daily  . gabapentin  200 mg Oral TID AC  . gabapentin  300 mg Oral QHS  . heparin  5,000 Units Subcutaneous 3 times per day  . levothyroxine  175 mcg Oral QAC breakfast  . lipase/protease/amylase  1 capsule Oral TID AC  . multivitamin with minerals  1 tablet Oral Q breakfast  . mupirocin ointment  1 application Nasal BID  . pantoprazole  40 mg Oral QHS  . polyvinyl alcohol  1 drop Both Eyes BID  . topiramate  200 mg Oral BID  . vitamin E  800 Units Oral BID      Assessment/Plan: s/p Procedure(s): LAPAROSCOPY DIAGNOSTIC WHIPPLE PROCEDURE  Apparent delayed gastric emptying and/or gastroparesis. Calorie count still inadequate for discharge Will try erythromycin base (low dose) as a temporary prokinetic agent. Cannot use long term. Discussed with the pharmacist. Continue calorie counts Continue Zofran, Phenergan, scopolamine  as needed for nausea.  Tardive dyskinesia. Reglan contraindicated  @PROBHOSP @  LOS: 31 days    [name] 09/07/2013  . .prob

## 2013-09-08 NOTE — Progress Notes (Signed)
32 Days Post-Op  Subjective: Although not recorded by nursing, the patient states that she has vomited small amounts twice since yesterday.  She states that she eats her food and she keeps it down but then later will vomit light yellow fluid. She denies emesis of food at this time. Still has mild pain, not severe. Started on erythromycin base as prokinetic agent yesterday.  Objective: Vital signs in last 24 hours: Temp:  [97.8 F (36.6 C)-98.3 F (36.8 C)] 98.3 F (36.8 C) (04/12 0501) Pulse Rate:  [78-106] 82 (04/12 0501) Resp:  [16] 16 (04/12 0501) BP: (104-131)/(54-66) 104/54 mmHg (04/12 0501) SpO2:  [97 %-100 %] 97 % (04/12 0501) Last BM Date: 09/05/13  Intake/Output from previous day: 04/11 0701 - 04/12 0700 In: 10 [I.V.:10] Out: 1600 [Urine:1600] Intake/Output this shift: Total I/O In: 10 [I.V.:10] Out: 300 [Urine:300]  General appearance: alert. Sitting at edge of bed. Pleasant. In no distress GI: abdomen soft. Minimal right-sided tenderness. Incision is healed. Not distended. Generally soft.  Lab Results:  No results found for this or any previous visit (from the past 24 hour(s)).   Studies/Results: No results found.  Marland Kitchen atenolol  25 mg Oral q morning - 10a  . bisacodyl  10 mg Rectal Daily  . Chlorhexidine Gluconate Cloth  6 each Topical Q0600  . dronabinol  2.5 mg Oral QAC lunch  . erythromycin  250 mg Oral TID AC  . feeding supplement (ENSURE COMPLETE)  237 mL Oral Q24H  . feeding supplement (ENSURE)  1 Container Oral Q24H  . FLUoxetine  20 mg Oral Daily  . gabapentin  200 mg Oral TID AC  . gabapentin  300 mg Oral QHS  . heparin  5,000 Units Subcutaneous 3 times per day  . levothyroxine  175 mcg Oral QAC breakfast  . lipase/protease/amylase  1 capsule Oral TID AC  . multivitamin with minerals  1 tablet Oral Q breakfast  . mupirocin ointment  1 application Nasal BID  . pantoprazole  40 mg Oral QHS  . polyvinyl alcohol  1 drop Both Eyes BID  . topiramate   200 mg Oral BID  . vitamin E  800 Units Oral BID     Assessment/Plan: s/p Procedure(s): LAPAROSCOPY DIAGNOSTIC WHIPPLE PROCEDURE  Apparent delayed gastric emptying and/or gastroparesis. Wonder about bile reflux gastritis. Consider upper endoscopy.  Calorie count still inadequate for discharge  Will try erythromycin base (low dose) as a temporary prokinetic agent short term   Continue Zofran, Phenergan, scopolamine as needed for nausea.  Tardive dyskinesia. Reglan contraindicated   @PROBHOSP @  LOS: 32 days    Adin Hector 09/08/2013  . .prob

## 2013-09-09 ENCOUNTER — Telehealth (INDEPENDENT_AMBULATORY_CARE_PROVIDER_SITE_OTHER): Payer: Self-pay | Admitting: General Surgery

## 2013-09-09 LAB — CBC
HCT: 34.8 % — ABNORMAL LOW (ref 36.0–46.0)
Hemoglobin: 11.3 g/dL — ABNORMAL LOW (ref 12.0–15.0)
MCH: 32 pg (ref 26.0–34.0)
MCHC: 32.5 g/dL (ref 30.0–36.0)
MCV: 98.6 fL (ref 78.0–100.0)
PLATELETS: 168 10*3/uL (ref 150–400)
RBC: 3.53 MIL/uL — ABNORMAL LOW (ref 3.87–5.11)
RDW: 14.8 % (ref 11.5–15.5)
WBC: 7.4 10*3/uL (ref 4.0–10.5)

## 2013-09-09 LAB — COMPREHENSIVE METABOLIC PANEL
ALT: 46 U/L — ABNORMAL HIGH (ref 0–35)
AST: 23 U/L (ref 0–37)
Albumin: 3 g/dL — ABNORMAL LOW (ref 3.5–5.2)
Alkaline Phosphatase: 195 U/L — ABNORMAL HIGH (ref 39–117)
BUN: 17 mg/dL (ref 6–23)
CO2: 24 mEq/L (ref 19–32)
CREATININE: 0.96 mg/dL (ref 0.50–1.10)
Calcium: 8.5 mg/dL (ref 8.4–10.5)
Chloride: 104 mEq/L (ref 96–112)
GFR calc Af Amer: 66 mL/min — ABNORMAL LOW (ref >90)
GFR calc non Af Amer: 57 mL/min — ABNORMAL LOW (ref >90)
Glucose, Bld: 87 mg/dL (ref 70–99)
Potassium: 3.5 mEq/L — ABNORMAL LOW (ref 3.7–5.3)
Sodium: 144 mEq/L (ref 137–147)
Total Bilirubin: 0.3 mg/dL (ref 0.3–1.2)
Total Protein: 5.8 g/dL — ABNORMAL LOW (ref 6.0–8.3)

## 2013-09-09 LAB — DIFFERENTIAL
BASOS ABS: 0 10*3/uL (ref 0.0–0.1)
Basophils Relative: 0 % (ref 0–1)
EOS PCT: 3 % (ref 0–5)
Eosinophils Absolute: 0.2 10*3/uL (ref 0.0–0.7)
Lymphocytes Relative: 30 % (ref 12–46)
Lymphs Abs: 2.2 10*3/uL (ref 0.7–4.0)
Monocytes Absolute: 0.6 10*3/uL (ref 0.1–1.0)
Monocytes Relative: 7 % (ref 3–12)
Neutro Abs: 4.4 10*3/uL (ref 1.7–7.7)
Neutrophils Relative %: 60 % (ref 43–77)

## 2013-09-09 LAB — PREALBUMIN: Prealbumin: 18.3 mg/dL (ref 17.0–34.0)

## 2013-09-09 NOTE — Progress Notes (Signed)
Physical Therapy Treatment Patient Details Name: [name] MRN: [id number] DOB: [birth year] Today's Date: 09/09/2013    History of Present Illness admitted with adenocarcinoma of the pancreatic head, s/p whipple procedure, stenting and diagnostic lap.    PT Comments    Patient was able to tolerate therapy. Patient able to increase gait training with no rest breaks and no complaints of nausea. Nursing staff present prior to therapy and  MD spoke with patient about d/c plan for tomorrow due to limited food intake over weekend. MD recommends increase intake for d/c. Continue to recommend SNF at this time for supervision with mobility.    Follow Up Recommendations  SNF;Supervision for mobility/OOB     Equipment Recommendations       Recommendations for Other Services       Precautions / Restrictions Precautions Precautions: Fall Precaution Comments: adominal wounds;  Restrictions Weight Bearing Restrictions: No    Mobility  Bed Mobility Overal bed mobility: Modified Independent Bed Mobility: Supine to Sit;Sit to Supine Rolling: Min guard Sidelying to sit: Min guard Supine to sit: Supervision Sit to supine: Supervision      Transfers Overall transfer level: Needs assistance Equipment used: Rolling walker (2 wheeled) Transfers: Sit to/from Stand Sit to Stand: Supervision Stand pivot transfers: Supervision       General transfer comment: A for IV pole  Ambulation/Gait Ambulation/Gait assistance: Min guard Ambulation Distance (Feet): 300 Feet Assistive device: Rolling walker (2 wheeled) Gait Pattern/deviations: Decreased stride length;Step-through pattern Gait velocity: decreased   General Gait Details: Patient able to tolerate increase gait training with no mention of nausea   Stairs            Wheelchair Mobility    Modified Rankin (Stroke Patients Only)       Balance Overall balance assessment: Needs assistance Sitting-balance support: No  upper extremity supported;Feet supported Sitting balance-Leahy Scale: Good Sitting balance - Comments: Patient required A for safety    Standing balance support: No upper extremity supported;During functional activity Standing balance-Leahy Scale: Good Standing balance comment: Patient able to perform ADLs without UE support                    Cognition Arousal/Alertness: Awake/alert Behavior During Therapy: WFL for tasks assessed/performed Overall Cognitive Status: Within Functional Limits for tasks assessed                      Exercises      General Comments        Pertinent Vitals/Pain Patient denies pain at this time.     Home Living                      Prior Function            PT Goals (current goals can now be found in the care plan section) Progress towards PT goals: Progressing toward goals    Frequency  Min 3X/week    PT Plan Current plan remains appropriate    Co-evaluation             End of Session Equipment Utilized During Treatment: Gait belt Activity Tolerance: Patient tolerated treatment well Patient left: in chair;with call bell/phone within reach     Time: 0753-0827 PT Time Calculation (min): 34 min  Charges:                       G Codes:      [place]  L Tykwon Fera, SPTA 09/09/2013, 9:03 AM

## 2013-09-09 NOTE — Progress Notes (Signed)
Seen and agreed 09/09/2013 Yarexi Pawlicki Elizabeth Xitlalli Newhard PTA [phone] pager [phone] office    

## 2013-09-09 NOTE — Progress Notes (Signed)
CSW sent updated clinicals to [facility] for d/c planning.    CSW will continue to follow Pt for d/c planning.    [facility]  [address];  [phone] Phone: [phone]

## 2013-09-09 NOTE — Telephone Encounter (Signed)
Discussed care with patient's son.  Will need to continue antiemetics, preferably before meals.    Will try to discharge this week.

## 2013-09-09 NOTE — Progress Notes (Signed)
Patient ID: [name], female   DOB: Apr 05, 1941, 73 y.o.   MRN: [id number] 33 Days Post-Op  Subjective: No nausea/vomiting last night.  Some yesterday AM, but small amount according to nursing.  Pt doing much better with therapy.    Objective: Vital signs in last 24 hours: Temp:  [98 F (36.7 C)-98.1 F (36.7 C)] 98 F (36.7 C) (04/13 0509) Pulse Rate:  [74-77] 74 (04/13 0509) Resp:  [16-17] 16 (04/13 0509) BP: (124-127)/(52-81) 124/81 mmHg (04/13 0509) SpO2:  [94 %-98 %] 94 % (04/13 0509) Last BM Date: 09/05/13  Intake/Output from previous day: 04/12 0701 - 04/13 0700 In: 400 [I.V.:400] Out: 1500 [Urine:1500] Intake/Output this shift: Total I/O In: 480 [P.O.:480] Out: 200 [Urine:200]  General appearance: alert. Sitting at edge of bed. Pleasant. In no distress GI: abdomen soft. Minimal right-sided tenderness. Incision is healed. Not distended. Generally soft.  Lab Results:  Results for orders placed during the hospital encounter of 08/07/13 (from the past 24 hour(s))  CBC     Status: Abnormal   Collection Time    09/09/13  5:02 AM      Result Value Ref Range   WBC 7.4  4.0 - 10.5 K/uL   RBC 3.53 (*) 3.87 - 5.11 MIL/uL   Hemoglobin 11.3 (*) 12.0 - 15.0 g/dL   HCT 34.8 (*) 36.0 - 46.0 %   MCV 98.6  78.0 - 100.0 fL   MCH 32.0  26.0 - 34.0 pg   MCHC 32.5  30.0 - 36.0 g/dL   RDW 14.8  11.5 - 15.5 %   Platelets 168  150 - 400 K/uL  DIFFERENTIAL     Status: None   Collection Time    09/09/13  5:02 AM      Result Value Ref Range   Neutrophils Relative % 60  43 - 77 %   Neutro Abs 4.4  1.7 - 7.7 K/uL   Lymphocytes Relative 30  12 - 46 %   Lymphs Abs 2.2  0.7 - 4.0 K/uL   Monocytes Relative 7  3 - 12 %   Monocytes Absolute 0.6  0.1 - 1.0 K/uL   Eosinophils Relative 3  0 - 5 %   Eosinophils Absolute 0.2  0.0 - 0.7 K/uL   Basophils Relative 0  0 - 1 %   Basophils Absolute 0.0  0.0 - 0.1 K/uL  COMPREHENSIVE METABOLIC PANEL     Status: Abnormal   Collection Time   09/09/13  5:02 AM      Result Value Ref Range   Sodium 144  137 - 147 mEq/L   Potassium 3.5 (*) 3.7 - 5.3 mEq/L   Chloride 104  96 - 112 mEq/L   CO2 24  19 - 32 mEq/L   Glucose, Bld 87  70 - 99 mg/dL   BUN 17  6 - 23 mg/dL   Creatinine, Ser 0.96  0.50 - 1.10 mg/dL   Calcium 8.5  8.4 - 10.5 mg/dL   Total Protein 5.8 (*) 6.0 - 8.3 g/dL   Albumin 3.0 (*) 3.5 - 5.2 g/dL   AST 23  0 - 37 U/L   ALT 46 (*) 0 - 35 U/L   Alkaline Phosphatase 195 (*) 39 - 117 U/L   Total Bilirubin 0.3  0.3 - 1.2 mg/dL   GFR calc non Af Amer 57 (*) >90 mL/min   GFR calc Af Amer 66 (*) >90 mL/min     Studies/Results: No results found.  Marland Kitchen  atenolol  25 mg Oral q morning - 10a  . bisacodyl  10 mg Rectal Daily  . Chlorhexidine Gluconate Cloth  6 each Topical Q0600  . dronabinol  2.5 mg Oral QAC lunch  . erythromycin  250 mg Oral TID AC  . feeding supplement (ENSURE COMPLETE)  237 mL Oral Q24H  . feeding supplement (ENSURE)  1 Container Oral Q24H  . FLUoxetine  20 mg Oral Daily  . gabapentin  200 mg Oral TID AC  . gabapentin  300 mg Oral QHS  . heparin  5,000 Units Subcutaneous 3 times per day  . levothyroxine  175 mcg Oral QAC breakfast  . lipase/protease/amylase  1 capsule Oral TID AC  . multivitamin with minerals  1 tablet Oral Q breakfast  . mupirocin ointment  1 application Nasal BID  . pantoprazole  40 mg Oral QHS  . polyvinyl alcohol  1 drop Both Eyes BID  . topiramate  200 mg Oral BID  . vitamin E  800 Units Oral BID     Assessment/Plan: s/p Procedure(s): LAPAROSCOPY DIAGNOSTIC WHIPPLE PROCEDURE  Apparent delayed gastric emptying and/or gastroparesis. Wonder about bile reflux gastritis. Consider upper endoscopy.  Calorie count still inadequate for discharge, possibly tomorrow/wednesday.   Consider EGD.   Will try erythromycin base (low dose) as a temporary prokinetic agent short term   Continue Zofran, Phenergan, scopolamine as needed for nausea.  Tardive dyskinesia. Reglan  contraindicated    LOS: 33 days    [name] 09/09/2013  . .prob

## 2013-09-09 NOTE — Progress Notes (Signed)
Occupational Therapy Treatment Patient Details Name: [name] MRN: [id number] DOB: 02-17-1941 Today's Date: 09/09/2013    History of present illness 73 yo female admitted with adenocarcinoma of the pancreatic head s/p whipple procedure stenting and diagnostic lap.   OT comments  Pt progressing toward OT goals at this time. Pt reports nausea and difficulty with voiding bowels this session.  Follow Up Recommendations  SNF    Equipment Recommendations  None recommended by OT    Recommendations for Other Services      Precautions / Restrictions Precautions Precautions: Fall Precaution Comments: adominal wounds;  Restrictions Weight Bearing Restrictions: No       Mobility Bed Mobility Overal bed mobility: Modified Independent Bed Mobility: Supine to Sit;Sit to Supine Rolling: Min guard Sidelying to sit: Min guard Supine to sit: Supervision Sit to supine: Supervision      Transfers Overall transfer level: Needs assistance Equipment used: Rolling walker (2 wheeled) Transfers: Sit to/from Stand Sit to Stand: Supervision Stand pivot transfers: Supervision       General transfer comment: (a) for IV pole    Balance Overall balance assessment: Needs assistance Sitting-balance support: No upper extremity supported;Feet supported Sitting balance-Leahy Scale: Good Sitting balance - Comments: Patient required A for safety    Standing balance support: No upper extremity supported;During functional activity Standing balance-Leahy Scale: Good Standing balance comment: Patient able to perform ADLs without UE support                   ADL   Eating/Feeding: Modified independent   Grooming: Wash/dry hands;Wash/dry face;Oral care;Supervision/safety               Lower Body Dressing: Supervision/safety;Bed level Lower Body Dressing Details (indicate cue type and reason): pt able to long sit on bed surface and reach bil LE Toilet Transfer:  Supervision/safety;Regular Toilet;RW;Grab bars   Toileting- Clothing Manipulation and Hygiene: Supervision/safety         General ADL Comments: pt requires incr time and fatigues quickly      Vision                     Perception     Praxis      Cognition   Behavior During Therapy: [employer] for tasks assessed/performed Overall Cognitive Status: Within Functional Limits for tasks assessed                       Extremity/Trunk Assessment               Exercises     Shoulder Instructions       General Comments      Pertinent Vitals/ Pain       nausea  Home Living                                          Prior Functioning/Environment              Frequency Min 2X/week     Progress Toward Goals  OT Goals(current goals can now be found in the care plan section)  Progress towards OT goals: Progressing toward goals  Acute Rehab OT Goals Patient Stated Goal: to get bowels working properly OT Goal Formulation: With patient Time For Goal Achievement: 09/12/13 Potential to Achieve Goals: Good ADL Goals Pt Will Perform Grooming: with modified independence;standing Pt Will Perform Lower  Body Dressing: with set-up;with supervision;sit to/from stand Pt Will Transfer to Toilet: with modified independence;ambulating;regular height toilet Pt Will Perform Toileting - Clothing Manipulation and hygiene: with modified independence;sit to/from stand;sitting/lateral leans Additional ADL Goal #1: pt will gather clothes/supplies and complete adl at mod I level Additional ADL Goal #2: pt will initiate at least one rest break per session for energy conservation  Plan Discharge plan remains appropriate    Co-evaluation                 End of Session     Activity Tolerance Patient tolerated treatment well;Other (comment) (reports nausea)   Patient Left in chair;with call bell/phone within reach   Nurse Communication Mobility  status;Precautions        Time: 3875-6433 OT Time Calculation (min): 24 min  Charges: OT General Charges $OT Visit: 1 Procedure OT Treatments $Self Care/Home Management : 23-37 mins  Peri Maris 09/09/2013, 10:55 AM Pager: [phone]

## 2013-09-09 NOTE — Progress Notes (Signed)
Pt vomitted large amount of brown liquid containing undigested food particles.

## 2013-09-10 ENCOUNTER — Other Ambulatory Visit: Payer: Self-pay | Admitting: *Deleted

## 2013-09-10 MED ORDER — BOOST / RESOURCE BREEZE PO LIQD
1.0000 | ORAL | Status: DC
  Administered 2013-09-11 – 2013-09-12 (×2): 1 via ORAL

## 2013-09-10 MED ORDER — CALCIUM POLYCARBOPHIL 625 MG PO TABS
1250.0000 mg | ORAL_TABLET | Freq: Every day | ORAL | Status: DC
  Administered 2013-09-10 – 2013-09-11 (×2): 1250 mg via ORAL
  Filled 2013-09-10 (×3): qty 2

## 2013-09-10 MED ORDER — PANCRELIPASE (LIP-PROT-AMYL) 12000-38000 UNITS PO CPEP
1.0000 | ORAL_CAPSULE | Freq: Three times a day (TID) | ORAL | Status: DC
  Administered 2013-09-10 – 2013-09-12 (×6): 1 via ORAL
  Filled 2013-09-10 (×9): qty 1

## 2013-09-10 MED ORDER — PROMETHAZINE HCL 12.5 MG PO TABS
12.5000 mg | ORAL_TABLET | Freq: Three times a day (TID) | ORAL | Status: DC
  Administered 2013-09-10 – 2013-09-12 (×8): 12.5 mg via ORAL
  Filled 2013-09-10 (×12): qty 1

## 2013-09-10 MED ORDER — ENSURE COMPLETE PO LIQD
237.0000 mL | ORAL | Status: DC
  Administered 2013-09-11: 237 mL via ORAL

## 2013-09-10 MED ORDER — DICLOFENAC SODIUM 1 % TD GEL
2.0000 g | Freq: Four times a day (QID) | TRANSDERMAL | Status: DC
  Administered 2013-09-10 – 2013-09-12 (×6): 2 g via TOPICAL
  Filled 2013-09-10: qty 100

## 2013-09-10 NOTE — Progress Notes (Signed)
Patient ID: [name], female   DOB: 06-10-1940, 73 y.o.   MRN: [id number] 34 Days Post-Op  Subjective:  Had bad night with nausea.  Tolerated food yesterday.    Objective: Vital signs in last 24 hours: Temp:  [98 F (36.7 C)-98.2 F (36.8 C)] 98.2 F (36.8 C) (04/14 3212) Pulse Rate:  [76-94] 85 (04/14 0632) Resp:  [16] 16 (04/14 2482) BP: (88-126)/(37-89) 88/37 mmHg (04/14 0632) SpO2:  [93 %-95 %] 93 % (04/14 5003) Last BM Date: 09/05/13  Intake/Output from previous day: 04/13 0701 - 04/14 0700 In: 631 [P.O.:480; I.V.:151] Out: 700 [Urine:700] Intake/Output this shift: Total I/O In: 480 [P.O.:480] Out: 200 [Urine:200]  General appearance: alert. Sitting at edge of bed. Pleasant. In no distress GI: abdomen soft. Minimal right-sided tenderness. Incision is healed. Not distended. Generally soft. Involuntary LE movements. \ Lab Results:  No results found for this or any previous visit (from the past 24 hour(s)).   Studies/Results: No results found.  Marland Kitchen atenolol  25 mg Oral q morning - 10a  . bisacodyl  10 mg Rectal Daily  . Chlorhexidine Gluconate Cloth  6 each Topical Q0600  . dronabinol  2.5 mg Oral QAC lunch  . erythromycin  250 mg Oral TID AC  . feeding supplement (ENSURE COMPLETE)  237 mL Oral Q24H  . feeding supplement (ENSURE)  1 Container Oral Q24H  . FLUoxetine  20 mg Oral Daily  . gabapentin  200 mg Oral TID AC  . gabapentin  300 mg Oral QHS  . heparin  5,000 Units Subcutaneous 3 times per day  . levothyroxine  175 mcg Oral QAC breakfast  . lipase/protease/amylase  1 capsule Oral TID AC  . multivitamin with minerals  1 tablet Oral Q breakfast  . mupirocin ointment  1 application Nasal BID  . pantoprazole  40 mg Oral QHS  . polyvinyl alcohol  1 drop Both Eyes BID  . topiramate  200 mg Oral BID  . vitamin E  800 Units Oral BID     Assessment/Plan: s/p Procedure(s): LAPAROSCOPY DIAGNOSTIC WHIPPLE PROCEDURE  Apparent delayed gastric emptying  and/or gastroparesis. Wonder about bile reflux gastritis. Consider upper endoscopy. Will get GI opinion.   Will try erythromycin base (low dose) as a temporary prokinetic agent short term   Continue Zofran, Phenergan, scopolamine as needed for nausea. Will do phenergan before meals.    Tardive dyskinesia. Reglan contraindicated    LOS: 34 days    [name] 09/10/2013  . .prob

## 2013-09-10 NOTE — Progress Notes (Signed)
NUTRITION FOLLOW UP  Intervention:    Nutritional supplements: Ensure Complete, Magic Cup, and [employer] once daily each  Encourage PO intake as tolerated  RD to follow nutrition Care Plan  Nutrition Dx:   Malnutrition related to altered GI function as evidenced by intake </= 50% of estimated energy requirement for >/= 5 days and mild loss of muscle mass, ongoing  Goal:   Intake to meet >90% of estimated nutrition needs; not currently being met  Monitor:   PO intake, labs, weight trend.  Assessment:   Patient presented to the hospital on 3/11 for diagnostic laparoscopy and Whipple procedure for pancreatic cancer.  Patient was NPO or on clear liquids for the first 5 days of admission. Diet advanced to full liquids on 3/17. Patient has had a lot of nausea and vomiting, not tolerating full liquid diet. TPN was initiated 3/18.  Patient reports nausea and vomiting since surgery last week. She has been unable to keep down any liquids this morning. Patient reports good appetite and good oral intake with stable weight PTA.   Pt's diet was advanced to Soft Diet on 4/5 and TPN has been discontinued. Pt reports ongoing nausea and vomiting. Per nursing notes pt is consuming 75-100% of some meals. At time of visit pt had only consumed about 25% of lunch tray.  Discussed supplement options and encouraged pt to drink one supplement for every missed meal.  Using bed scale at time of visit 4/1- pt's weight was 153 lbs- 9 lbs weight loss. Spoke with RN about obtaining new weight when pt gets back into bed.   +2.4 fluid balance Labs: low potassium, low albumin, low hemoglobin  Height: Ht Readings from Last 1 Encounters:  08/10/13 '5\' 2"'  (1.575 m)    Weight Status:   Wt Readings from Last 1 Encounters:  08/10/13 162 lb 11.2 oz (73.8 kg)    Re-estimated needs:  Kcal: 1650-1850  Protein: 85-100 gm  Fluid: 1.7-1.9 L  Skin: abdominal incision; +1 generalized edema  Diet Order:  Criss Rosales   Intake/Output Summary (Last 24 hours) at 09/10/13 1531 Last data filed at 09/10/13 1437  Gross per 24 hour  Intake    840 ml  Output    700 ml  Net    140 ml    Last BM: 4/9  Labs:   Recent Labs Lab 09/09/13 0502  NA 144  K 3.5*  CL 104  CO2 24  BUN 17  CREATININE 0.96  CALCIUM 8.5  GLUCOSE 87    CBG (last 3)  No results found for this basename: GLUCAP,  in the last 72 hours  Scheduled Meds: . atenolol  25 mg Oral q morning - 10a  . bisacodyl  10 mg Rectal Daily  . Chlorhexidine Gluconate Cloth  6 each Topical Q0600  . dronabinol  2.5 mg Oral QAC lunch  . erythromycin  250 mg Oral TID AC  . feeding supplement (ENSURE COMPLETE)  237 mL Oral Q24H  . feeding supplement (ENSURE)  1 Container Oral Q24H  . FLUoxetine  20 mg Oral Daily  . gabapentin  200 mg Oral TID AC  . gabapentin  300 mg Oral QHS  . heparin  5,000 Units Subcutaneous 3 times per day  . levothyroxine  175 mcg Oral QAC breakfast  . lipase/protease/amylase  1 capsule Oral TID AC  . multivitamin with minerals  1 tablet Oral Q breakfast  . mupirocin ointment  1 application Nasal BID  . pantoprazole  40 mg  Oral QHS  . polycarbophil  1,250 mg Oral QHS  . polyvinyl alcohol  1 drop Both Eyes BID  . promethazine  12.5 mg Oral TID AC & HS  . topiramate  200 mg Oral BID  . vitamin E  800 Units Oral BID    Continuous Infusions: . sodium chloride 20 mL/hr (09/08/13 2251)   Pryor Ochoa RD, LDN Inpatient Clinical Dietitian Pager: [phone] After Hours Pager: [phone]

## 2013-09-10 NOTE — Consult Note (Signed)
Referring Provider: Dr. [name] Primary Care Physician:  Adella Hare, MD Primary Gastroenterologist:  Dr. Oletta Lamas  Reason for Consultation:  Nausea and vomiting, need for endoscopy  HPI: [name] is a 73 y.o. female who is about 5 week status post Whipple procedure for early pancreatic cancer. She has a gastrojejunostomy anatomy. Because of intolerance of oral nutrition, TNA was started on March 18 and was stopped approximately 3 weeks later. She is currently on a soft diet with ongoing nausea, food intolerance, and occasional vomiting (most recently, yesterday evening), with less than ideal food intake. On March 26, she had a Gastrografin upper GI series which showed patency of her gastrojejunostomy anastomosis, but it was difficult to visualize of the caliber of the anastomosis is uncertain. It is not clear whether the patient's issue is more one of anatomic blockage, versus gastric dysmotility. Accordingly, endoscopic evaluation has been requested. The patient was started on erythromycin as a prokinetic agent. She is not otherwise a medications which would likely be contributing to nausea, such as metronidazole or iron supplementation.   Past Medical History  Diagnosis Date  . Osteoarthritis   . Hiatal hernia   . Diarrhea   . Back pain   . Chronic maxillary sinusitis   . Depression   . Eustachian tube dysfunction   . Tardive dyskinesia   . Mitral valve prolapse   . Osteoarthritis   . GERD (gastroesophageal reflux disease)   . Allergic rhinitis   . Glaucoma   . Hypothyroid   . Hypertension   . Heart murmur     hx. "MVP" -predental antibiotics  . Memory loss   . Urgency of urination   . Stroke     mini storkes left leg paraylsis  . Benign neoplasm of colon   . Cancer 07/10/13    Pancreatic cancer with MRI scan 06-19-13    Past Surgical History  Procedure Laterality Date  . Knee surgery Left     x 5  . 1 baker cyst removed    . Dilation and curettage of uterus       x3  . Lumbar spine surgery      x2  . Abdominal hysterectomy    . Lumbar spine surgery      cyst  . Joint replacement      LTKA  . Blepharoplasty Bilateral     with cataract surgery  . Eye surgery Right     cataract  . Eus N/A 07/10/2013    Procedure: ESOPHAGEAL ENDOSCOPIC ULTRASOUND (EUS) RADIAL;  Surgeon: Arta Silence, MD;  Location: [facility];  Service: Endoscopy;  Laterality: N/A;  . Fine needle aspiration N/A 07/10/2013    Procedure: FINE NEEDLE ASPIRATION (FNA) LINEAR;  Surgeon: Arta Silence, MD;  Location: [facility];  Service: Endoscopy;  Laterality: N/A;  possible fna  . Back surgery      fusion  . Breast surgery      Biopsy left 2 times  . Colonoscopy w/ polypectomy    . Laparoscopy N/A 08/07/2013    Procedure: LAPAROSCOPY DIAGNOSTIC;  Surgeon: [name], MD;  Location: [facility];  Service: General;  Laterality: N/A;  . Whipple procedure N/A 08/07/2013    Procedure: WHIPPLE PROCEDURE;  Surgeon: [name], MD;  Location: [facility];  Service: General;  Laterality: N/A;    Prior to Admission medications   Medication Sig Start Date End Date Taking? Authorizing Provider  acetaminophen (TYLENOL) 325 MG tablet Take 650 mg by mouth every 6 (six) hours  as needed for mild pain, moderate pain or fever.   Yes Historical Provider, MD  Ascorbic Acid (VITAMIN C) 1000 MG tablet Take 1,000 mg by mouth 2 (two) times daily.   Yes Historical Provider, MD  aspirin 81 MG tablet Take 81 mg by mouth daily.     Yes Historical Provider, MD  atenolol (TENORMIN) 50 MG tablet Take 25 mg by mouth every morning.   Yes Historical Provider, MD  Calcium Carbonate-Vitamin D (CALTRATE 600+D PO) Take 1 tablet by mouth 2 (two) times daily.   Yes Historical Provider, MD  carboxymethylcellulose (REFRESH TEARS) 0.5 % SOLN Place 1 drop into both eyes 2 (two) times daily.    Yes Historical Provider, MD  cholecalciferol (VITAMIN D) 1000 UNITS tablet Take 1,000 Units by mouth daily.    Yes Historical  Provider, MD  cyanocobalamin 2000 MCG tablet Take 2,000 mcg by mouth daily.   Yes Historical Provider, MD  docusate sodium (COLACE) 100 MG capsule Take 200 mg by mouth at bedtime.   Yes Historical Provider, MD  FLUoxetine (PROZAC) 20 MG capsule Take 1 capsule by mouth  daily   Yes Neena Rhymes, MD  gabapentin (NEURONTIN) 100 MG capsule Take 200 mg by mouth 4 (four) times daily. + 100 mg at night 07/03/13  Yes Dennie Bible, NP  hydrOXYzine (VISTARIL) 25 MG capsule Take 25 mg by mouth at bedtime.   Yes Historical Provider, MD  levothyroxine (SYNTHROID, LEVOTHROID) 175 MCG tablet Take 1 tablet by mouth   every day 07/12/13  Yes Neena Rhymes, MD  loratadine (CLARITIN) 10 MG tablet Take 10 mg by mouth daily.   Yes Historical Provider, MD  LORazepam (ATIVAN) 0.5 MG tablet Take 0.5 mg by mouth 3 (three) times daily.    Yes Historical Provider, MD  lovastatin (MEVACOR) 40 MG tablet Take 1 tablet by mouth at   bedtime   Yes Neena Rhymes, MD  meclizine (ANTIVERT) 25 MG tablet Take 25 mg by mouth every 6 (six) hours as needed for dizziness.    Yes Historical Provider, MD  moxifloxacin (VIGAMOX) 0.5 % ophthalmic solution Place 1 drop into the left eye 3 (three) times daily.    Yes Historical Provider, MD  Multiple Vitamin (MULTIVITAMIN WITH MINERALS) TABS tablet Take 1 tablet by mouth daily.   Yes Historical Provider, MD  Multiple Vitamins-Minerals (ICAPS) CAPS Take 1 capsule by mouth daily.    Yes Historical Provider, MD  omeprazole (PRILOSEC OTC) 20 MG tablet Take 20 mg by mouth 2 (two) times daily.    Yes Historical Provider, MD  polycarbophil (FIBERCON) 625 MG tablet Take 1,250 mg by mouth at bedtime.   Yes Historical Provider, MD  promethazine (PHENERGAN) 25 MG tablet Take 25 mg by mouth every 6 (six) hours as needed for nausea or vomiting.    Yes Historical Provider, MD  topiramate (TOPAMAX) 200 MG tablet Take 1 tablet by mouth   twice a day 07/12/13  Yes Neena Rhymes, MD  UNABLE TO  FIND Apply 1 application topically 2 (two) times daily. Doxep/Amanta/DXM/Lidocaine 25 mg for her feet   Yes Historical Provider, MD  vitamin E 400 UNIT capsule Take 800 Units by mouth 2 (two) times daily. For tartive dyskinesia   Yes Historical Provider, MD    Current Facility-Administered Medications  Medication Dose Route Frequency Provider Last Rate Last Dose  . 0.45 % sodium chloride infusion   Intravenous Continuous Lauren Bajbus, RPH 20 mL/hr at 09/08/13 2251 20 mL/hr at  09/08/13 2251  . alum & mag hydroxide-simeth (MAALOX/MYLANTA) 200-200-20 MG/5ML suspension 30 mL  30 mL Oral Q4H PRN Zenovia Jarred, MD   30 mL at 09/09/13 2321  . atenolol (TENORMIN) tablet 25 mg  25 mg Oral q morning - 10a Edward Jolly, MD   25 mg at 09/10/13 1008  . bisacodyl (DULCOLAX) suppository 10 mg  10 mg Rectal Daily [name], MD   10 mg at 09/09/13 0945  . Chlorhexidine Gluconate Cloth 2 % PADS 6 each  6 each Topical Q0600 [name], MD   6 each at 09/10/13 0708  . dronabinol (MARINOL) capsule 2.5 mg  2.5 mg Oral QAC lunch [name], MD   2.5 mg at 09/10/13 1313  . erythromycin (E-MYCIN) tablet 250 mg  250 mg Oral TID [facility] Adin Hector, MD   250 mg at 09/10/13 1313  . [START ON 09/11/2013] feeding supplement (ENSURE COMPLETE) (ENSURE COMPLETE) liquid 237 mL  237 mL Oral Q24H Reanne [place] Pink, RD      . feeding supplement (RESOURCE BREEZE) (RESOURCE BREEZE) liquid 1 Container  1 Container Oral Q24H Baird Lyons, RD      . FLUoxetine (PROZAC) 20 MG/5ML solution 20 mg  20 mg Oral Daily [name], MD   20 mg at 09/10/13 1008  . gabapentin (NEURONTIN) capsule 200 mg  200 mg Oral TID AC [name], MD   200 mg at 09/10/13 1313  . gabapentin (NEURONTIN) capsule 300 mg  300 mg Oral QHS [name], MD   300 mg at 09/09/13 2312  . heparin injection 5,000 Units  5,000 Units Subcutaneous 3 times per day [name], MD   5,000 Units at 09/10/13 1400  . levothyroxine (SYNTHROID, LEVOTHROID)  tablet 175 mcg  175 mcg Oral QAC breakfast Edward Jolly, MD   175 mcg at 09/10/13 1007  . lipase/protease/amylase (CREON-12/PANCREASE) capsule 1 capsule  1 capsule Oral TID AC [name], MD   1 capsule at 09/10/13 1313  . LORazepam (ATIVAN) tablet 0.5 mg  0.5 mg Oral Q8H PRN Imogene Burn. Tsuei, MD   0.5 mg at 09/08/13 2334  . multivitamin with minerals tablet 1 tablet  1 tablet Oral Q breakfast Juanda Chance [place], [facility]   1 tablet at 09/10/13 1007  . mupirocin ointment (BACTROBAN) 2 % 1 application  1 application Nasal BID [name], MD   1 application at 70/26/37 1006  . ondansetron (ZOFRAN) injection 4 mg  4 mg Intravenous Q6H PRN [name], MD   4 mg at 09/09/13 1805  . ondansetron (ZOFRAN) tablet 4 mg  4 mg Oral Q4H PRN Imogene Burn. Tsuei, MD   4 mg at 09/10/13 1141  . oxyCODONE-acetaminophen (PERCOCET/ROXICET) 5-325 MG per tablet 1 tablet  1 tablet Oral Q4H PRN Gayland Curry, MD   1 tablet at 09/06/13 0155  . pantoprazole (PROTONIX) EC tablet 40 mg  40 mg Oral QHS [name], MD   40 mg at 09/09/13 2314  . phenol (CHLORASEPTIC) mouth spray 1 spray  1 spray Mouth/Throat PRN [name], MD   1 spray at 08/21/13 0424  . polycarbophil (FIBERCON) tablet 1,250 mg  1,250 mg Oral QHS [name], MD      . polyvinyl alcohol (LIQUIFILM TEARS) 1.4 % ophthalmic solution 1 drop  1 drop Both Eyes BID [name], MD   1 drop at 09/10/13 1009  . promethazine (PHENERGAN) suppository 12.5 mg  12.5 mg Rectal Q6H PRN [name], MD  12.5 mg at 09/05/13 1318  . promethazine (PHENERGAN) tablet 12.5 mg  12.5 mg Oral TID AC & HS [name], MD   12.5 mg at 09/10/13 1312  . scopolamine (TRANSDERM-SCOP) 1.5 MG 1.5 mg  1 patch Transdermal Q72H PRN [name], MD   1.5 mg at 09/09/13 2322  . sodium chloride 0.9 % injection 10-40 mL  10-40 mL Intracatheter PRN [name], MD   10 mL at 09/09/13 0501  . topiramate (TOPAMAX) tablet 200 mg  200 mg Oral BID [name], MD   200 mg at 09/10/13 1008   . vitamin E capsule 800 Units  800 Units Oral BID [name], MD   800 Units at 09/10/13 1008    Allergies as of 07/16/2013 - Review Complete 07/10/2013  Allergen Reaction Noted  . Celecoxib Other (See Comments)   . Glucosamine    . Metoclopramide hcl Other (See Comments)   . Niacin Other (See Comments)   . Phenazopyridine hcl Other (See Comments)   . Sulfonamide derivatives Nausea And Vomiting   . Trovan [alatrofloxacin mesylate]  09/16/2010  . Benzocaine-resorcinol Rash   . Sulfa antibiotics Rash 07/02/2013    Family History  Problem Relation Age of Onset  . Cancer Mother     Breast Cancer with Metastatic disease  . Alzheimer's disease Father   . Cancer Maternal Uncle     prostate    History   Social History  . Marital Status: Widowed    Spouse Name: N/A    Number of Children: 2  . Years of Education: 8   Occupational History  .    [employer] Retired    Social History Main Topics  . Smoking status: Never Smoker   . Smokeless tobacco: Never Used  . Alcohol Use: No  . Drug Use: No  . Sexual Activity: Not Currently   Other Topics Concern  . 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 Occasional, small stools, consistent with her history of diminished oral intake.   Physical Exam: Vital signs in last 24 hours: Temp:  [98 F (36.7 C)-98.3 F (36.8 C)] 98.3 F (36.8 C) (04/14 1409) Pulse Rate:  [85-94] 85 (04/14 1409) Resp:  [16-17] 17 (04/14 1409) BP: (88-126)/(37-89) 120/57 mmHg (04/14 1409) SpO2:  [93 %-97 %] 97 % (04/14 1409) Last BM Date: 09/05/13 This is a generally healthy appearing female sitting in  a bedside chair, no evident distress. No pallor or icterus. Chest completely clear, heart without murmurs or arrhythmias. Abdomen actually slightly adipose, without overt tenderness  Intake/Output from previous day: 04/13 0701 - 04/14 0700 In: 631 [P.O.:480; I.V.:151] Out: 700 [Urine:700] Intake/Output this shift: Total I/O In: 840 [P.O.:840] Out: 300 [Urine:300]  Lab Results:  Recent Labs  09/09/13 0502  WBC 7.4  HGB 11.3*  HCT 34.8*  PLT 168   BMET  Recent Labs  09/09/13 0502  NA 144  K 3.5*  CL 104  CO2 24  GLUCOSE 87  BUN 17  CREATININE 0.96  CALCIUM 8.5   LFT  Recent Labs  09/09/13 0502  PROT 5.8*  ALBUMIN 3.0*  AST 23  ALT 46*  ALKPHOS 195*  BILITOT 0.3   PT/INR No results found for this basename: LABPROT, INR,  in the last 72 hours  Studies/Results: No results found.  Impression: Nausea and vomiting of unclear cause; differential diagnosis is between anatomic and functional etiology  Plan: Endoscopic evaluation tomorrow. Petra Kuba, purpose, and risks reviewed, and patient agreeable. I told her there is a slight chance that we would have to take biopsies or perhaps do a balloon dilatation of her anastomosis if a significant stricture is identified. However, I do not anticipate that this will be a dilatation procedure.   LOS: 34 days   [name]  09/10/2013, 5:45 PM

## 2013-09-11 ENCOUNTER — Encounter (HOSPITAL_COMMUNITY): Payer: Medicare Other | Admitting: Anesthesiology

## 2013-09-11 ENCOUNTER — Encounter (HOSPITAL_COMMUNITY): Payer: Self-pay | Admitting: Anesthesiology

## 2013-09-11 ENCOUNTER — Inpatient Hospital Stay (HOSPITAL_COMMUNITY): Payer: Medicare Other | Admitting: Anesthesiology

## 2013-09-11 ENCOUNTER — Encounter (HOSPITAL_COMMUNITY)
Admission: RE | Disposition: A | Discharge status: Skilled Nursing Facility | Payer: Self-pay | Source: Ambulatory Visit | Attending: General Surgery

## 2013-09-11 DIAGNOSIS — K30 Functional dyspepsia: Secondary | ICD-10-CM

## 2013-09-11 HISTORY — PX: ESOPHAGOGASTRODUODENOSCOPY: SHX5428

## 2013-09-11 SURGERY — EGD (ESOPHAGOGASTRODUODENOSCOPY)
Anesthesia: Monitor Anesthesia Care

## 2013-09-11 MED ORDER — ONDANSETRON HCL 4 MG/2ML IJ SOLN: 4.0000 mg | Freq: Four times a day (QID) | INTRAMUSCULAR | Status: DC | PRN

## 2013-09-11 MED ORDER — FENTANYL CITRATE 0.05 MG/ML IJ SOLN
INTRAMUSCULAR | Status: DC | PRN
  Administered 2013-09-11: 50 ug via INTRAVENOUS

## 2013-09-11 MED ORDER — SODIUM CHLORIDE 0.9 % IV SOLN: INTRAVENOUS | Status: DC

## 2013-09-11 MED ORDER — PROPOFOL 10 MG/ML IV BOLUS
INTRAVENOUS | Status: DC | PRN
  Administered 2013-09-11: 20 mg via INTRAVENOUS
  Administered 2013-09-11: 10 mg via INTRAVENOUS

## 2013-09-11 MED ORDER — SUCRALFATE 1 GM/10ML PO SUSP: 1.0000 g | Freq: Three times a day (TID) | ORAL | Status: DC

## 2013-09-11 MED ORDER — SUCRALFATE 1 GM/10ML PO SUSP
1.0000 g | Freq: Three times a day (TID) | ORAL | Status: DC
  Administered 2013-09-11 – 2013-09-12 (×4): 1 g via ORAL
  Filled 2013-09-11 (×8): qty 10

## 2013-09-11 MED ORDER — FENTANYL CITRATE 0.05 MG/ML IJ SOLN
25.0000 ug | INTRAMUSCULAR | Status: DC | PRN
  Administered 2013-09-11: 25 ug via INTRAVENOUS
  Filled 2013-09-11: qty 2

## 2013-09-11 MED ORDER — MIDAZOLAM HCL 5 MG/5ML IJ SOLN
INTRAMUSCULAR | Status: DC | PRN
  Administered 2013-09-11 (×2): 1 mg via INTRAVENOUS

## 2013-09-11 MED ORDER — SODIUM CHLORIDE 0.45 % IV SOLN
INTRAVENOUS | Status: DC | PRN
  Administered 2013-09-11: 150 mL via INTRAVENOUS

## 2013-09-11 NOTE — Progress Notes (Signed)
Endoscopy well tolerated. It showed significant bile reflux. Have ordered sucralfate. Please see dictated report. Voicemail left with Dr. [name].  Cleotis Nipper, M.D. [phone]

## 2013-09-11 NOTE — Progress Notes (Signed)
Cap changed and port flushed per protocol.  Dressing change due today.  Pt currently being transported via wc for Endo.  Dressing to be done later this shift.

## 2013-09-11 NOTE — Progress Notes (Signed)
Patient ID: [name], female   DOB: 10/21/1940, 73 y.o.   MRN: [id number] Day of Surgery  Subjective:  Got EGD today with bile acid reflux.    Objective: Vital signs in last 24 hours: Temp:  [97.5 F (36.4 C)-98.3 F (36.8 C)] 98 F (36.7 C) (04/15 0958) Pulse Rate:  [71-85] 76 (04/15 1014) Resp:  [13-21] 13 (04/15 1014) BP: (105-135)/(48-66) 122/50 mmHg (04/15 1014) SpO2:  [96 %-100 %] 99 % (04/15 1014) Last BM Date: 09/05/13  Intake/Output from previous day: 04/14 0701 - 04/15 0700 In: 840 [P.O.:840] Out: 600 [Urine:600] Intake/Output this shift: Total I/O In: 10 [I.V.:10] Out: 250 [Urine:250]  General appearance: alert. Sitting at edge of bed. Pleasant. In no distress GI: abdomen soft. Minimal right-sided tenderness. Incision is healed. Not distended. Generally soft. Involuntary LE movements. \ Lab Results:  No results found for this or any previous visit (from the past 24 hour(s)).   Studies/Results: No results found.  Marland Kitchen atenolol  25 mg Oral q morning - 10a  . bisacodyl  10 mg Rectal Daily  . diclofenac sodium  2 g Topical QID  . dronabinol  2.5 mg Oral QAC lunch  . erythromycin  250 mg Oral TID AC  . feeding supplement (ENSURE COMPLETE)  237 mL Oral Q24H  . feeding supplement (RESOURCE BREEZE)  1 Container Oral Q24H  . FLUoxetine  20 mg Oral Daily  . gabapentin  200 mg Oral TID AC  . gabapentin  300 mg Oral QHS  . heparin  5,000 Units Subcutaneous 3 times per day  . levothyroxine  175 mcg Oral QAC breakfast  . lipase/protease/amylase  1 capsule Oral TID AC  . multivitamin with minerals  1 tablet Oral Q breakfast  . pantoprazole  40 mg Oral QHS  . polycarbophil  1,250 mg Oral QHS  . polyvinyl alcohol  1 drop Both Eyes BID  . promethazine  12.5 mg Oral TID AC & HS  . sucralfate  1 g Oral TID WC & HS  . topiramate  200 mg Oral BID  . vitamin E  800 Units Oral BID     Assessment/Plan: s/p Procedure(s): LAPAROSCOPY DIAGNOSTIC WHIPPLE  PROCEDURE  Apparent delayed gastric emptying and/or gastroparesis.  Bile acid reflux -add carafate.  Will try erythromycin base (low dose) as a temporary prokinetic agent short term   Continue Zofran, Phenergan, scopolamine as needed for nausea. Will do phenergan before meals.    Tardive dyskinesia. Reglan contraindicated ? To SNF tomorrow if doing OK with carafate.      LOS: 35 days    [name] 09/11/2013  . .prob

## 2013-09-11 NOTE — Discharge Summary (Signed)
Physician Discharge Summary  Patient ID: [name] MRN: [id number] DOB/AGE: 11/14/1940 73 y.o.  Admit date: 08/07/2013 Discharge date: 09/12/2013  Admission Diagnoses: Patient Active Problem List   Diagnosis Date Noted  . Delayed gastric emptying 09/11/2013    Priority: High  . Carcinoma of head of pancreas 07/01/2013    Priority: High  . Malnutrition of moderate degree 08/21/2013  . Unspecified hereditary and idiopathic peripheral neuropathy 08/30/2012  . Paresthesia of foot 06/06/2012  . Syncope 09/26/2011  . RHINOSINUSITIS, CHRONIC 02/03/2009  . COLONIC POLYPS 06/05/2008  . ISCHEMIA 06/04/2008  . OSTEOARTHRITIS 06/04/2008  . DIARRHEA, CHRONIC 04/22/2008  . BACK PAIN 08/03/2007  . CHRONIC MAXILLARY SINUSITIS 04/20/2007  . HYPOTHYROIDISM 02/26/2007  . HYPERLIPIDEMIA 02/26/2007  . ANXIETY 02/26/2007  . DEPRESSION 02/26/2007  . TARDIVE DYSKINESIA 02/26/2007  . GLAUCOMA 02/26/2007  . EUSTACHIAN TUBE DYSFUNCTION 02/26/2007  . HYPERTENSION 02/26/2007  . MITRAL VALVE PROLAPSE 02/26/2007  . ALLERGIC RHINITIS 02/26/2007  . GERD 02/26/2007  . HIATAL HERNIA 02/26/2007  . OSTEOPOROSIS 02/26/2007    Discharge Diagnoses:  Active Problems:   Carcinoma of head of pancreas   Delayed gastric emptying   Malnutrition of moderate degree delayed gastric emptying Bile acid reflux  Discharged Condition: stable  Hospital Course:  Pt was admitted to the ICU after uncomplicated pancreaticoduodenectomy with minimal blood loss.  She had a little confusion and hypertension on POD 1.  PT was consulted to to her frailty.  She pulled out her own NGT on POD 2.  She was started on clear liquids, but once she got to full liquids, she started having nausea.  She was transferred to floor and started on TNA.  She had significant urinary retention, and had to have her foley reinserted several times.    She continued to have severe nausea.  She was kept NPO for a while and started on multiple  antiemetics.  She could not be placed on reglan due to her chronic tardive dyskinesia.  Upper GI demonstrated patency of her gastrojejunostomy.  She had no fever/chills, and her drains were serosanguinous.    She did receive a trial of antibiotics as her WBCs were elevated and she was nauseated, but never had a fever.  Drains were removed.    She had a fall one night while on pain medication and hit her breast and left side.  She got out of bed without assistance.  CXR was negative for fracture.    She continued on TNA with different aggressive nausea medications.  She was eventually able to have her diet advanced, but continued to still have nausea and poor po intake.  Over the next 1-2 weeks, her intake slowly improved and TNA was discontinued.  She continued to have significant nausea and occasional emesis, but was able to keep food and liquids down.  EGD was performed by GI and demonstrated bile acid reflux.  Carafate was started.    She was off TNA for a week and maintained her UOP and electrolytes without evidence of dehydration.  Her strength also continued to improve, with her walking in hall with walker.    She is discharged to home in fair, but improved condition.    Consults: GI  Significant Diagnostic Studies: labs: K 3.5-4.3, Cr 0.96, Cl 104, BUN 17, HCT 34.8  Treatments: IV hydration, antibiotics: vancomycin and Zosyn, analgesia: acetaminophen, Morphine and percocet, insulin: while on TNA, TPN, therapies: PT and OT, procedures: PICC line and egd and surgery: whipple.  Discharge Exam: Blood  pressure 124/56, pulse 90, temperature 98.2 F (36.8 C), temperature source Oral, resp. rate 16, height 5\' 2"  (1.575 m), weight 162 lb 11.2 oz (73.8 kg), SpO2 95.00%. General appearance: alert, cooperative and no distress Resp: breathing comfortably GI: soft, non-tender; bowel sounds normal; no masses,  no organomegaly Extremities: extremities normal, atraumatic, no cyanosis or  edema  Disposition: 01-Home or Self Care      Discharge Orders   Future Appointments Provider Department Dept Phone   10/08/13 2:00 PM Ladene Artist, MD [facility] Medical Oncology [phone]   12/31/2013 9:30 AM Nilda Riggs, NP [facility] [phone]   Future Orders Complete By Expires   Call MD for:  difficulty breathing, headache or visual disturbances  As directed    Call MD for:  hives  As directed    Call MD for:  persistant dizziness or light-headedness  As directed    Call MD for:  persistant nausea and vomiting  As directed    Call MD for:  redness, tenderness, or signs of infection (pain, swelling, redness, odor or green/yellow discharge around incision site)  As directed    Call MD for:  severe uncontrolled pain  As directed    Call MD for:  temperature >100.4  As directed    Diet - low sodium heart healthy  As directed    Increase activity slowly  As directed        Medication List    STOP taking these medications       CALTRATE 600+D PO     cyanocobalamin 2000 MCG tablet     ICAPS Caps     promethazine 25 MG tablet  Commonly known as:  PHENERGAN  Replaced by:  promethazine 12.5 MG suppository     vitamin C 1000 MG tablet      TAKE these medications       acetaminophen 325 MG tablet  Commonly known as:  TYLENOL  Take 650 mg by mouth every 6 (six) hours as needed for mild pain, moderate pain or fever.     alum & mag hydroxide-simeth 200-200-20 MG/5ML suspension  Commonly known as:  MAALOX/MYLANTA  Take 30 mLs by mouth every 4 (four) hours as needed for indigestion or heartburn.     aspirin 81 MG tablet  Take 81 mg by mouth daily.     atenolol 50 MG tablet  Commonly known as:  TENORMIN  Take 25 mg by mouth every morning.     bisacodyl 10 MG suppository  Commonly known as:  DULCOLAX  Place 1 suppository (10 mg total) rectally daily.     cholecalciferol 1000 UNITS tablet  Commonly known as:  VITAMIN  D  Take 1,000 Units by mouth daily.     docusate sodium 100 MG capsule  Commonly known as:  COLACE  Take 200 mg by mouth at bedtime.     dronabinol 2.5 MG capsule  Commonly known as:  MARINOL  Take 1 capsule (2.5 mg total) by mouth daily before lunch.     erythromycin 250 MG tablet  Commonly known as:  E-MYCIN  Take 1 tablet (250 mg total) by mouth 3 (three) times daily before meals.     feeding supplement (ENSURE COMPLETE) Liqd  Take 237 mLs by mouth daily.     feeding supplement (RESOURCE BREEZE) Liqd  Take 1 Container by mouth daily.     FLUoxetine 20 MG capsule  Commonly known as:  PROZAC  Take 1 capsule by  mouth  daily     gabapentin 100 MG capsule  Commonly known as:  NEURONTIN  Take 200 mg by mouth 4 (four) times daily. + 100 mg at night     hydrOXYzine 25 MG capsule  Commonly known as:  VISTARIL  Take 25 mg by mouth at bedtime.     levothyroxine 175 MCG tablet  Commonly known as:  SYNTHROID, LEVOTHROID  Take 1 tablet by mouth   every day     lipase/protease/amylase 12000 UNITS Cpep capsule  Commonly known as:  CREON-12/PANCREASE  Take 1 capsule by mouth 3 (three) times daily before meals.     loratadine 10 MG tablet  Commonly known as:  CLARITIN  Take 10 mg by mouth daily.     LORazepam 0.5 MG tablet  Commonly known as:  ATIVAN  Take 0.5 mg by mouth 3 (three) times daily.     lovastatin 40 MG tablet  Commonly known as:  MEVACOR  Take 1 tablet by mouth at   bedtime     meclizine 25 MG tablet  Commonly known as:  ANTIVERT  Take 25 mg by mouth every 6 (six) hours as needed for dizziness.     moxifloxacin 0.5 % ophthalmic solution  Commonly known as:  VIGAMOX  Place 1 drop into the left eye 3 (three) times daily.     multivitamin with minerals Tabs tablet  Take 1 tablet by mouth daily.     omeprazole 20 MG tablet  Commonly known as:  PRILOSEC OTC  Take 20 mg by mouth 2 (two) times daily.     ondansetron 4 MG tablet  Commonly known as:  ZOFRAN   Take 1 tablet (4 mg total) by mouth every 4 (four) hours as needed for nausea.     oxyCODONE-acetaminophen 5-325 MG per tablet  Commonly known as:  PERCOCET/ROXICET  Take 1 tablet by mouth every 4 (four) hours as needed for severe pain.     polycarbophil 625 MG tablet  Commonly known as:  FIBERCON  Take 1,250 mg by mouth at bedtime.     promethazine 12.5 MG suppository  Commonly known as:  PHENERGAN  Place 1 suppository (12.5 mg total) rectally every 6 (six) hours as needed for nausea, vomiting or refractory nausea / vomiting.     promethazine 12.5 MG tablet  Commonly known as:  PHENERGAN  Take 1 tablet (12.5 mg total) by mouth 4 (four) times daily -  before meals and at bedtime.     REFRESH TEARS 0.5 % Soln  Generic drug:  carboxymethylcellulose  Place 1 drop into both eyes 2 (two) times daily.     scopolamine 1 MG/3DAYS  Commonly known as:  TRANSDERM-SCOP  Place 1 patch (1.5 mg total) onto the skin every 3 (three) days as needed (nausea).     sucralfate 1 GM/10ML suspension  Commonly known as:  CARAFATE  Take 10 mLs (1 g total) by mouth 4 (four) times daily -  with meals and at bedtime.     topiramate 200 MG tablet  Commonly known as:  TOPAMAX  Take 1 tablet by mouth   twice a day     UNABLE TO FIND  Apply 1 application topically 2 (two) times daily. Doxep/Amanta/DXM/Lidocaine 25 mg for her feet     vitamin E 400 UNIT capsule  Take 800 Units by mouth 2 (two) times daily. For tartive dyskinesia       Follow-up Information   Follow up with [facility], MD. Call in  2 weeks.   Specialty:  General Surgery   Contact information:   [place] [address] [phone]       Signed: [name] 09/12/2013, 1:40 AM

## 2013-09-11 NOTE — Progress Notes (Signed)
I reviewed the patient's endoscopic findings and the rationale for sucralfate therapy with the patient and her sons, [name] and Gerald Stabs, at the bedside.  Cleotis Nipper, M.D. [phone]

## 2013-09-11 NOTE — Transfer of Care (Signed)
Immediate Anesthesia Transfer of Care Note  Patient: [name]  Procedure(s) Performed: Procedure(s) with comments: ESOPHAGOGASTRODUODENOSCOPY (EGD) (N/A) - Moderate sedation okay if MAC not available  Patient Location: PACU and Endoscopy Unit  Anesthesia Type:MAC  Level of Consciousness: awake  Airway & Oxygen Therapy: Patient Spontanous Breathing and Patient connected to nasal cannula oxygen  Post-op Assessment: Report given to PACU RN and Post -op Vital signs reviewed and stable  Post vital signs: Reviewed and stable  Complications: No apparent anesthesia complications

## 2013-09-11 NOTE — Progress Notes (Signed)
   CSW sent updated clinicals to [facility].   CSW will continue to follow Pt for d/c planning.    [facility]  [address];  [phone] Phone: [phone]

## 2013-09-11 NOTE — Op Note (Signed)
[facility] [place], [address]   ENDOSCOPY PROCEDURE REPORT  PATIENT: [name], [name]  MR#: [id number] BIRTHDATE: 10-Oct-1940 , 73  yrs. old GENDER: Female ENDOSCOPIST:Biridiana Twardowski, MD REFERRED BY:  Dr. [name] PROCEDURE DATE:  09/11/2013 PROCEDURE:     upper endoscopy with biopsies ASA CLASS: INDICATIONS:   patient is 5 week status post Whipple procedure for pancreatic cancer, with persistent nausea, vomiting, and food intolerance MEDICATION:    MAC per anesthesia TOPICAL ANESTHETIC:  DESCRIPTION OF PROCEDURE:   the procedure had been explained to the patient, who provided written consent and was brought from her hospital room to the [facility] cone endoscopy unit. MAC anesthesia was provided and the patient remained stable throughout the procedure. Time out was performed prior to passage of the scope.  The Pentax adult video endoscope was passed under direct vision. The larynx appeared grossly normal.  The esophagus was entered without significant difficulty, and was noted to have a fair amount of bile film scattered throughout. There was no evidence of esophagitis, varices, neoplasia, infection, ring, stricture, or hiatal hernia.  The stomach was entered. It contained a moderate to large bilious residual, which was suctioned out. The gastric mucosa did not appear "beefy red" or overtly inflamed, but biopsies were obtained to look for histologic evidence of bile reflux gastritis. There was some slight nodularity at the anastomosis consistent with granulation tissue, and I did not biopsy that.  The gastrojejunostomy anastomosis was widely patent. I explored both limbs of the small bowel, and they appeared normal.  There was some slight angulation, without stenosis, that the scope had to maneuver in order to enter the efferent limb.  The scope was then removed from the patient, who tolerated the procedure  well.     COMPLICATIONS: None  ENDOSCOPIC IMPRESSION:  1. Significant bile reflux, status post gastrojejunostomy, with bile even up in the esophagus and a fairly large puddle of bile in the gastric remnant. 2. Healthy-appearing gastrojejunostomy anastomosis without any evidence of stenosis or ulceration. Anatomy probably normal, although the scope had to negotiate a turn to enter the efferent limb.  RECOMMENDATIONS:  1. Trial of sucralfate for bile reflux 2. Await pathology on gastric biopsies    _______________________________ eSigned:  Ronald Lobo, MD 09/11/2013 10:07 AM    PATIENT NAME:  [name], [name] MR#: [id number]

## 2013-09-11 NOTE — Progress Notes (Signed)
Physical Therapy Treatment Patient Details Name: [name] MRN: [id number] DOB: 1941-04-20 Today's Date: 09/11/2013    History of Present Illness 73 yo female admitted with adenocarcinoma of the pancreatic head s/p whipple procedure stenting and diagnostic lap.    PT Comments    Pt has made notable improvements. Goals met and will be updated.  Follow Up Recommendations  SNF;Supervision for mobility/OOB     Equipment Recommendations  None recommended by PT    Recommendations for Other Services       Precautions / Restrictions Precautions Precautions: Fall Precaution Comments: adominal wounds;     Mobility  Bed Mobility Overal bed mobility: Modified Independent                Transfers Overall transfer level: Needs assistance Equipment used: Rolling walker (2 wheeled) Transfers: Sit to/from Stand Sit to Stand: Supervision Stand pivot transfers: Supervision       General transfer comment: safe technique  Ambulation/Gait Ambulation/Gait assistance: Supervision Ambulation Distance (Feet): 600 Feet Assistive device: Rolling walker (2 wheeled) Gait Pattern/deviations: Step-through pattern Gait velocity: decreased   General Gait Details: fluid gait with some ability to increase speed over her comfortable slower speed.  After gait mention of treying to hold mild nausea at bay.   Stairs            Wheelchair Mobility    Modified Rankin (Stroke Patients Only)       Balance     Sitting balance-Leahy Scale: Normal     Standing balance support: No upper extremity supported Standing balance-Leahy Scale: Good                      Cognition Arousal/Alertness: Awake/alert Behavior During Therapy: WFL for tasks assessed/performed Overall Cognitive Status: Within Functional Limits for tasks assessed                      Exercises General Exercises - Lower Extremity Hip ABduction/ADduction: AROM;15 reps;Both;Standing Hip  Flexion/Marching: AROM;Both;10 reps;Standing Toe Raises: AROM;Both;10 reps;Standing Heel Raises: AROM;Both;10 reps;Standing Mini-Sqauts: AROM;Both;10 reps;Standing    General Comments        Pertinent Vitals/Pain     Home Living                      Prior Function            PT Goals (current goals can now be found in the care plan section) Acute Rehab PT Goals PT Goal Formulation: With patient Time For Goal Achievement: 09/18/13 Potential to Achieve Goals: Good Progress towards PT goals: Progressing toward goals    Frequency  Min 3X/week    PT Plan Current plan remains appropriate    Co-evaluation             End of Session   Activity Tolerance: Patient tolerated treatment well Patient left: in bed;with call bell/phone within reach (long-sitting in the bed)     Time: 5697-9480 PT Time Calculation (min): 25 min  Charges:  $Gait Training: 8-22 mins $Therapeutic Exercise: 8-22 mins                    G CodesTessie Fass Daksha Koone 09/11/2013, 4:10 PM 09/11/2013  Donnella Sham, PT [phone]  (pager)

## 2013-09-11 NOTE — Anesthesia Preprocedure Evaluation (Addendum)
Anesthesia Evaluation  Patient identified by MRN, date of birth, ID band Patient awake    Reviewed: Allergy & Precautions, H&P , NPO status , Patient's Chart, lab work & pertinent test results, reviewed documented beta blocker date and time   Airway Mallampati: II  Neck ROM: full    Dental  (+) Lower Dentures, Partial Upper   Pulmonary          Cardiovascular hypertension, Pt. on medications     Neuro/Psych Anxiety Depression  Neuromuscular disease CVA    GI/Hepatic hiatal hernia, GERD-  ,Whipple 07/2013   Endo/Other  Hypothyroidism   Renal/GU      Musculoskeletal   Abdominal   Peds  Hematology   Anesthesia Other Findings   Reproductive/Obstetrics                         Anesthesia Physical Anesthesia Plan  ASA: III  Anesthesia Plan: MAC   Post-op Pain Management:    Induction: Intravenous  Airway Management Planned: Nasal Cannula  Additional Equipment:   Intra-op Plan:   Post-operative Plan:   Informed Consent: I have reviewed the patients History and Physical, chart, labs and discussed the procedure including the risks, benefits and alternatives for the proposed anesthesia with the patient or authorized representative who has indicated his/her understanding and acceptance.     Plan Discussed with: CRNA, Anesthesiologist and Surgeon  Anesthesia Plan Comments:         Anesthesia Quick Evaluation

## 2013-09-12 ENCOUNTER — Encounter (HOSPITAL_COMMUNITY): Payer: Self-pay | Admitting: Gastroenterology

## 2013-09-12 ENCOUNTER — Telehealth: Payer: Self-pay | Admitting: Oncology

## 2013-09-12 DIAGNOSIS — R059 Cough, unspecified: Secondary | ICD-10-CM | POA: Diagnosis not present

## 2013-09-12 DIAGNOSIS — C259 Malignant neoplasm of pancreas, unspecified: Secondary | ICD-10-CM | POA: Diagnosis not present

## 2013-09-12 DIAGNOSIS — K219 Gastro-esophageal reflux disease without esophagitis: Secondary | ICD-10-CM | POA: Diagnosis not present

## 2013-09-12 DIAGNOSIS — E039 Hypothyroidism, unspecified: Secondary | ICD-10-CM | POA: Diagnosis not present

## 2013-09-12 DIAGNOSIS — R110 Nausea: Secondary | ICD-10-CM | POA: Diagnosis not present

## 2013-09-12 DIAGNOSIS — R062 Wheezing: Secondary | ICD-10-CM | POA: Diagnosis not present

## 2013-09-12 DIAGNOSIS — K5900 Constipation, unspecified: Secondary | ICD-10-CM | POA: Diagnosis not present

## 2013-09-12 DIAGNOSIS — G609 Hereditary and idiopathic neuropathy, unspecified: Secondary | ICD-10-CM | POA: Diagnosis not present

## 2013-09-12 DIAGNOSIS — R209 Unspecified disturbances of skin sensation: Secondary | ICD-10-CM | POA: Diagnosis not present

## 2013-09-12 DIAGNOSIS — I10 Essential (primary) hypertension: Secondary | ICD-10-CM | POA: Diagnosis not present

## 2013-09-12 DIAGNOSIS — C250 Malignant neoplasm of head of pancreas: Secondary | ICD-10-CM | POA: Diagnosis not present

## 2013-09-12 DIAGNOSIS — F341 Dysthymic disorder: Secondary | ICD-10-CM | POA: Diagnosis not present

## 2013-09-12 DIAGNOSIS — R109 Unspecified abdominal pain: Secondary | ICD-10-CM | POA: Diagnosis not present

## 2013-09-12 DIAGNOSIS — R05 Cough: Secondary | ICD-10-CM | POA: Diagnosis not present

## 2013-09-12 DIAGNOSIS — R262 Difficulty in walking, not elsewhere classified: Secondary | ICD-10-CM | POA: Diagnosis not present

## 2013-09-12 DIAGNOSIS — E785 Hyperlipidemia, unspecified: Secondary | ICD-10-CM | POA: Diagnosis not present

## 2013-09-12 DIAGNOSIS — Z48815 Encounter for surgical aftercare following surgery on the digestive system: Secondary | ICD-10-CM | POA: Diagnosis not present

## 2013-09-12 DIAGNOSIS — R55 Syncope and collapse: Secondary | ICD-10-CM | POA: Diagnosis not present

## 2013-09-12 DIAGNOSIS — K3189 Other diseases of stomach and duodenum: Secondary | ICD-10-CM | POA: Diagnosis not present

## 2013-09-12 DIAGNOSIS — E46 Unspecified protein-calorie malnutrition: Secondary | ICD-10-CM | POA: Diagnosis not present

## 2013-09-12 DIAGNOSIS — G608 Other hereditary and idiopathic neuropathies: Secondary | ICD-10-CM | POA: Diagnosis not present

## 2013-09-12 DIAGNOSIS — M6281 Muscle weakness (generalized): Secondary | ICD-10-CM | POA: Diagnosis not present

## 2013-09-12 DIAGNOSIS — M6289 Other specified disorders of muscle: Secondary | ICD-10-CM | POA: Diagnosis not present

## 2013-09-12 DIAGNOSIS — F411 Generalized anxiety disorder: Secondary | ICD-10-CM | POA: Diagnosis not present

## 2013-09-12 MED ORDER — ONDANSETRON HCL 4 MG PO TABS: 4.0000 mg | ORAL_TABLET | ORAL | Status: DC | PRN

## 2013-09-12 MED ORDER — PANCRELIPASE (LIP-PROT-AMYL) 12000-38000 UNITS PO CPEP: 1.0000 | ORAL_CAPSULE | Freq: Three times a day (TID) | ORAL | Status: DC

## 2013-09-12 MED ORDER — BOOST / RESOURCE BREEZE PO LIQD: 1.0000 | ORAL | Status: DC

## 2013-09-12 MED ORDER — BISACODYL 10 MG RE SUPP: 10.0000 mg | Freq: Every day | RECTAL | Status: DC

## 2013-09-12 MED ORDER — PROMETHAZINE HCL 12.5 MG RE SUPP: 12.5000 mg | Freq: Four times a day (QID) | RECTAL | Status: DC | PRN

## 2013-09-12 MED ORDER — SCOPOLAMINE 1 MG/3DAYS TD PT72
1.0000 | MEDICATED_PATCH | TRANSDERMAL | Status: DC | PRN
Start: 2013-09-12 — End: 2013-11-27

## 2013-09-12 MED ORDER — ALUM & MAG HYDROXIDE-SIMETH 200-200-20 MG/5ML PO SUSP: 30.0000 mL | ORAL | Status: DC | PRN

## 2013-09-12 MED ORDER — ENSURE COMPLETE PO LIQD: 237.0000 mL | ORAL | Status: DC

## 2013-09-12 MED ORDER — DRONABINOL 2.5 MG PO CAPS: 2.5000 mg | ORAL_CAPSULE | Freq: Every day | ORAL | Status: DC

## 2013-09-12 MED ORDER — ERYTHROMYCIN BASE 250 MG PO TABS: 250.0000 mg | ORAL_TABLET | Freq: Three times a day (TID) | ORAL | Status: DC

## 2013-09-12 MED ORDER — SUCRALFATE 1 GM/10ML PO SUSP: 1.0000 g | Freq: Three times a day (TID) | ORAL | Status: DC

## 2013-09-12 MED ORDER — PROMETHAZINE HCL 12.5 MG PO TABS: 12.5000 mg | ORAL_TABLET | Freq: Three times a day (TID) | ORAL | Status: DC

## 2013-09-12 MED ORDER — OXYCODONE-ACETAMINOPHEN 5-325 MG PO TABS: 1.0000 | ORAL_TABLET | ORAL | Status: DC | PRN

## 2013-09-12 NOTE — Telephone Encounter (Signed)
lmonvm advising that her Friday's appt on 09/24/13 has been cancelled and r/s to 10/04/2013.

## 2013-09-12 NOTE — Clinical Social Work Note (Signed)
Patient for d/c today to SNF bed at [facility]. Son and patient agreeable to this plan- plan transfer via son's vehicle.  Eduard Clos, MSW, [place]

## 2013-09-12 NOTE — Clinical Social Work Placement (Signed)
Clinical Social Work Department CLINICAL SOCIAL WORK PLACEMENT NOTE 09/12/2013  Patient:  [name], [name]  Account Number:  [id number] Admit date:  08/07/2013  Clinical Social Worker:  Megan Salon  Date/time:  08/09/2013 03:38 PM  Clinical Social Work is seeking post-discharge placement for this patient at the following level of care:   [facility]   (*CSW will update this form in Epic as items are completed)   08/09/2013  Patient/family provided with [place] Department of Clinical Social Work's list of facilities offering this level of care within the geographic area requested by the patient (or if unable, by the patient's family).  08/09/2013  Patient/family informed of their freedom to choose among providers that offer the needed level of care, that participate in Medicare, Medicaid or managed care program needed by the patient, have an available bed and are willing to accept the patient.  08/09/2013  Patient/family informed of [facility]' ownership interest in [facility], as well as of the fact that they are under no obligation to receive care at this facility.  PASARR submitted to EDS on 08/09/2013 PASARR number received from [employer] on 08/09/2013  FL2 transmitted to all facilities in geographic area requested by pt/family on  08/09/2013 FL2 transmitted to all facilities within larger geographic area on   Patient informed that his/her managed care company has contracts with or will negotiate with  certain facilities, including the following:     Patient/family informed of bed offers received:  09/11/2013 Patient chooses bed at [facility] Physician recommends and patient chooses bed at    Patient to be transferred to [facility] on  09/12/2013 Patient to be transferred to facility by EMS  The following physician request were entered in Epic:   Additional Comments: Eduard Clos, MSW,  [place]

## 2013-09-12 NOTE — Discharge Instructions (Signed)
[facility] °[phone] ° °ABDOMINAL SURGERY: POST OP INSTRUCTIONS ° °Always review your discharge instruction sheet given to you by the facility where your surgery was performed. ° °IF YOU HAVE DISABILITY OR FAMILY LEAVE FORMS, YOU MUST BRING THEM TO THE OFFICE FOR PROCESSING.  PLEASE DO NOT GIVE THEM TO YOUR DOCTOR. ° °1. A prescription for pain medication may be given to you upon discharge.  Take your pain medication as prescribed, if needed.  If narcotic pain medicine is not needed, then you may take acetaminophen (Tylenol) or ibuprofen (Advil) as needed. °2. Take your usually prescribed medications unless otherwise directed. °3. If you need a refill on your pain medication, please contact your pharmacy. They will contact our office to request authorization.  Prescriptions will not be filled after 5pm or on week-ends. °4. You should follow a light diet the first few days after arrival home, such as soup and crackers, pudding, etc.unless your doctor has advised otherwise. A high-fiber, low fat diet can be resumed as tolerated.   Be sure to include lots of fluids daily. Most patients will experience some swelling and bruising on the chest and neck area.  Ice packs will help.  Swelling and bruising can take several days to resolve °5. Most patients will experience some swelling and bruising in the area of the incision. Ice pack will help. Swelling and bruising can take several days to resolve..  °6. It is common to experience some constipation if taking pain medication after surgery.  Increasing fluid intake and taking a stool softener will usually help or prevent this problem from occurring.  A mild laxative (Milk of Magnesia or Miralax) should be taken according to package directions if there are no bowel movements after 48 hours. °7.  You may have steri-strips (small skin tapes) in place directly over the incision.  These strips should be left on the skin for 10-14 days.  If your  surgeon used skin glue on the incision, you may shower in 48 hours.  The glue will flake off over the next 2-3 weeks.  Any sutures or staples will be removed at the office during your follow-up visit. You may find that a light gauze bandage over your incision may keep your staples from being rubbed or pulled. You may shower and replace the bandage daily. °8. ACTIVITIES:  You may resume regular (light) daily activities beginning the next day--such as daily self-care, walking, climbing stairs--gradually increasing activities as tolerated.  You may have sexual intercourse when it is comfortable.  Refrain from any heavy lifting or straining until approved by your doctor. °a. You may drive when you no longer are taking prescription pain medication, you can comfortably wear a seatbelt, and you can safely maneuver your car and apply brakes °b. Return to Work: __________8 weeks if applicable_________________________ °9. You should see your doctor in the office for a follow-up appointment approximately two weeks after your surgery.  Make sure that you call for this appointment within a day or two after you arrive home to insure a convenient appointment time. °OTHER INSTRUCTIONS:  °_____________________________________________________________ °_____________________________________________________________ ° °WHEN TO CALL YOUR DOCTOR: °1. Fever over 101.0 °2. Inability to urinate °3. Nausea and/or vomiting °4. Extreme swelling or bruising °5. Continued bleeding from incision. °6. Increased pain, redness, or drainage from the incision. °7. Difficulty swallowing or breathing °8. Muscle cramping or spasms. °9. Numbness or tingling in hands or feet or around lips. ° °The clinic staff is   available to answer your questions during regular business hours.  Please don’t hesitate to call and ask to speak to one of the nurses if you have concerns. ° °For further questions, please visit [email or web address] ° ° ° °

## 2013-09-12 NOTE — Progress Notes (Signed)
Patient discharged to home.  Patient alert, oriented, verbally responsive, breathing regular and non-labored throughout.  Mild complaints of nausea, patient medicated per MD order.  Needs attended to.  Patient's son at bedside, given brown folder and instructed to take brown folder to [facility] and give to the nurse taking care of the patient at [facility].  Patient's son agreed.  Needs attended to.  Patient left unit per wheelchair accompanied by transportation.  VS WNL.  Son to transport patient to [facility].    Dirk Dress 09/12/2013 4:27 PM

## 2013-09-12 NOTE — Anesthesia Postprocedure Evaluation (Signed)
Anesthesia Post Note  Patient: [name]  Procedure(s) Performed: Procedure(s) (LRB): ESOPHAGOGASTRODUODENOSCOPY (EGD) (N/A)  Anesthesia type: MAC  Patient location: PACU  Post pain: Pain level controlled and Adequate analgesia  Post assessment: Post-op Vital signs reviewed, Patient's Cardiovascular Status Stable and Respiratory Function Stable  Last Vitals:  Filed Vitals:   09/12/13 1033  BP: 112/70  Pulse:   Temp:   Resp:     Post vital signs: Reviewed and stable  Level of consciousness: awake, alert  and oriented  Complications: No apparent anesthesia complications

## 2013-09-12 NOTE — Progress Notes (Signed)
Report given to Publishing copy at [facility] and [employer] in [place].    Dirk Dress 09/12/2013 4:24 PM

## 2013-09-13 ENCOUNTER — Ambulatory Visit: Payer: Medicare Other | Admitting: Oncology

## 2013-09-13 DIAGNOSIS — I10 Essential (primary) hypertension: Secondary | ICD-10-CM | POA: Diagnosis not present

## 2013-09-13 DIAGNOSIS — F411 Generalized anxiety disorder: Secondary | ICD-10-CM | POA: Diagnosis not present

## 2013-09-13 DIAGNOSIS — G609 Hereditary and idiopathic neuropathy, unspecified: Secondary | ICD-10-CM | POA: Diagnosis not present

## 2013-09-13 DIAGNOSIS — E039 Hypothyroidism, unspecified: Secondary | ICD-10-CM | POA: Diagnosis not present

## 2013-09-13 DIAGNOSIS — R110 Nausea: Secondary | ICD-10-CM | POA: Diagnosis not present

## 2013-09-13 DIAGNOSIS — C259 Malignant neoplasm of pancreas, unspecified: Secondary | ICD-10-CM | POA: Diagnosis not present

## 2013-09-13 DIAGNOSIS — E46 Unspecified protein-calorie malnutrition: Secondary | ICD-10-CM | POA: Diagnosis not present

## 2013-09-13 DIAGNOSIS — E785 Hyperlipidemia, unspecified: Secondary | ICD-10-CM | POA: Diagnosis not present

## 2013-09-13 NOTE — Anesthesia Postprocedure Evaluation (Signed)
  Anesthesia Post-op Note  Patient: [name]  Procedure(s) Performed: Procedure(s): LAPAROSCOPY DIAGNOSTIC (N/A) WHIPPLE PROCEDURE (N/A)  Patient Location: PACU  Anesthesia Type:General  Level of Consciousness: awake, alert  and oriented  Airway and Oxygen Therapy: Patient Spontanous Breathing and Patient connected to nasal cannula oxygen  Post-op Pain: mild  Post-op Assessment: Post-op Vital signs reviewed, Patient's Cardiovascular Status Stable, Respiratory Function Stable, Patent Airway and Pain level controlled  Post-op Vital Signs: stable  Last Vitals:  Filed Vitals:   09/12/13 1033  BP: 112/70  Pulse:   Temp:   Resp:     Complications: No apparent anesthesia complications

## 2013-09-18 ENCOUNTER — Telehealth (INDEPENDENT_AMBULATORY_CARE_PROVIDER_SITE_OTHER): Payer: Self-pay

## 2013-09-18 NOTE — Telephone Encounter (Signed)
Pt son calling asking if we could switch her pharmacy to [facility] on [address] instead of the mail order. Advised I would switch it. He asked why only 3 days worth of zofran was ordered. I explained we typically only order 20 tablets of zofran at a time which is what was prescribed. Pt son understands.

## 2013-09-19 ENCOUNTER — Telehealth (INDEPENDENT_AMBULATORY_CARE_PROVIDER_SITE_OTHER): Payer: Self-pay | Admitting: General Surgery

## 2013-09-19 NOTE — Telephone Encounter (Signed)
Terry from [facility] called to get the okay from Dr. [name] about filling a Rx for erythromycin.  The reason for this is because this medication is contraindicated with her already started Lovastatin.  Informed her that I would send this message to Dr. [name] to address.  The number to reach the pharmacy back at is [phone] with the reference #: [id number].  Informed Coralyn Mark we would give her a call back as soon as we received an answer from Dr. [name].

## 2013-09-26 ENCOUNTER — Telehealth (INDEPENDENT_AMBULATORY_CARE_PROVIDER_SITE_OTHER): Payer: Self-pay

## 2013-09-26 NOTE — Telephone Encounter (Signed)
Pts s/p Whipple on 08/07/13 by Dr [name]. Pts daughter in law has called the office to inform us that she has been vomiting since surgery. She has been also having some pain, heartburn and dizziness.  Pt has been seen by a MD in her rehab facility and they have told her that there was nothing else at this time that they are able to do for her. Dr [name] is out of the office at this time, spoke with Dr Brantley Stage and his recommendations was to have pt go to the ER to been seen. Called daughter in law back to inform them of Dr Cornett's recommendations and she stated that she would call the rehab facility to see how they would get her to the ER or if she would need to be the one to take her to the ER.

## 2013-09-27 DIAGNOSIS — E039 Hypothyroidism, unspecified: Secondary | ICD-10-CM | POA: Diagnosis not present

## 2013-09-27 DIAGNOSIS — M6281 Muscle weakness (generalized): Secondary | ICD-10-CM | POA: Diagnosis not present

## 2013-09-27 DIAGNOSIS — R062 Wheezing: Secondary | ICD-10-CM | POA: Diagnosis not present

## 2013-09-27 DIAGNOSIS — R059 Cough, unspecified: Secondary | ICD-10-CM | POA: Diagnosis not present

## 2013-09-27 DIAGNOSIS — R110 Nausea: Secondary | ICD-10-CM | POA: Diagnosis not present

## 2013-09-27 DIAGNOSIS — M6289 Other specified disorders of muscle: Secondary | ICD-10-CM | POA: Diagnosis not present

## 2013-09-27 DIAGNOSIS — R55 Syncope and collapse: Secondary | ICD-10-CM | POA: Diagnosis not present

## 2013-09-27 DIAGNOSIS — K3189 Other diseases of stomach and duodenum: Secondary | ICD-10-CM | POA: Diagnosis not present

## 2013-09-27 DIAGNOSIS — R262 Difficulty in walking, not elsewhere classified: Secondary | ICD-10-CM | POA: Diagnosis not present

## 2013-09-27 DIAGNOSIS — R109 Unspecified abdominal pain: Secondary | ICD-10-CM | POA: Diagnosis not present

## 2013-09-27 DIAGNOSIS — Z48815 Encounter for surgical aftercare following surgery on the digestive system: Secondary | ICD-10-CM | POA: Diagnosis not present

## 2013-09-27 DIAGNOSIS — C250 Malignant neoplasm of head of pancreas: Secondary | ICD-10-CM | POA: Diagnosis not present

## 2013-09-27 DIAGNOSIS — R209 Unspecified disturbances of skin sensation: Secondary | ICD-10-CM | POA: Diagnosis not present

## 2013-09-27 DIAGNOSIS — K5900 Constipation, unspecified: Secondary | ICD-10-CM | POA: Diagnosis not present

## 2013-09-27 DIAGNOSIS — G608 Other hereditary and idiopathic neuropathies: Secondary | ICD-10-CM | POA: Diagnosis not present

## 2013-09-27 DIAGNOSIS — F341 Dysthymic disorder: Secondary | ICD-10-CM | POA: Diagnosis not present

## 2013-09-27 DIAGNOSIS — E46 Unspecified protein-calorie malnutrition: Secondary | ICD-10-CM | POA: Diagnosis not present

## 2013-09-27 DIAGNOSIS — K219 Gastro-esophageal reflux disease without esophagitis: Secondary | ICD-10-CM | POA: Diagnosis not present

## 2013-09-27 DEATH — deceased

## 2013-10-01 ENCOUNTER — Telehealth: Payer: Self-pay | Admitting: Oncology

## 2013-10-01 NOTE — Telephone Encounter (Signed)
Pt's son r/s MD to july pt in rehab

## 2013-10-03 DIAGNOSIS — R059 Cough, unspecified: Secondary | ICD-10-CM | POA: Diagnosis not present

## 2013-10-03 DIAGNOSIS — R109 Unspecified abdominal pain: Secondary | ICD-10-CM | POA: Diagnosis not present

## 2013-10-03 DIAGNOSIS — K219 Gastro-esophageal reflux disease without esophagitis: Secondary | ICD-10-CM | POA: Diagnosis not present

## 2013-10-03 DIAGNOSIS — R110 Nausea: Secondary | ICD-10-CM | POA: Diagnosis not present

## 2013-10-03 DIAGNOSIS — K5900 Constipation, unspecified: Secondary | ICD-10-CM | POA: Diagnosis not present

## 2013-10-03 DIAGNOSIS — R05 Cough: Secondary | ICD-10-CM | POA: Diagnosis not present

## 2013-10-03 DIAGNOSIS — R062 Wheezing: Secondary | ICD-10-CM | POA: Diagnosis not present

## 2013-10-03 DIAGNOSIS — F341 Dysthymic disorder: Secondary | ICD-10-CM | POA: Diagnosis not present

## 2013-10-03 DIAGNOSIS — E039 Hypothyroidism, unspecified: Secondary | ICD-10-CM | POA: Diagnosis not present

## 2013-10-04 ENCOUNTER — Ambulatory Visit: Payer: Medicare Other | Admitting: Oncology

## 2013-10-04 DIAGNOSIS — K219 Gastro-esophageal reflux disease without esophagitis: Secondary | ICD-10-CM | POA: Diagnosis not present

## 2013-10-04 DIAGNOSIS — R109 Unspecified abdominal pain: Secondary | ICD-10-CM | POA: Diagnosis not present

## 2013-10-04 DIAGNOSIS — R059 Cough, unspecified: Secondary | ICD-10-CM | POA: Diagnosis not present

## 2013-10-04 DIAGNOSIS — R062 Wheezing: Secondary | ICD-10-CM | POA: Diagnosis not present

## 2013-10-04 DIAGNOSIS — E039 Hypothyroidism, unspecified: Secondary | ICD-10-CM | POA: Diagnosis not present

## 2013-10-04 DIAGNOSIS — R110 Nausea: Secondary | ICD-10-CM | POA: Diagnosis not present

## 2013-10-04 DIAGNOSIS — K5900 Constipation, unspecified: Secondary | ICD-10-CM | POA: Diagnosis not present

## 2013-10-04 DIAGNOSIS — F341 Dysthymic disorder: Secondary | ICD-10-CM | POA: Diagnosis not present

## 2013-10-04 DIAGNOSIS — R05 Cough: Secondary | ICD-10-CM | POA: Diagnosis not present

## 2013-10-24 DIAGNOSIS — C250 Malignant neoplasm of head of pancreas: Secondary | ICD-10-CM | POA: Diagnosis not present

## 2013-10-24 DIAGNOSIS — R413 Other amnesia: Secondary | ICD-10-CM | POA: Diagnosis not present

## 2013-10-24 DIAGNOSIS — F329 Major depressive disorder, single episode, unspecified: Secondary | ICD-10-CM | POA: Diagnosis not present

## 2013-10-24 DIAGNOSIS — F3289 Other specified depressive episodes: Secondary | ICD-10-CM | POA: Diagnosis not present

## 2013-10-24 DIAGNOSIS — R269 Unspecified abnormalities of gait and mobility: Secondary | ICD-10-CM | POA: Diagnosis not present

## 2013-10-24 DIAGNOSIS — M6281 Muscle weakness (generalized): Secondary | ICD-10-CM | POA: Diagnosis not present

## 2013-10-24 DIAGNOSIS — Z483 Aftercare following surgery for neoplasm: Secondary | ICD-10-CM | POA: Diagnosis not present

## 2013-10-28 DIAGNOSIS — Z483 Aftercare following surgery for neoplasm: Secondary | ICD-10-CM | POA: Diagnosis not present

## 2013-10-28 DIAGNOSIS — F329 Major depressive disorder, single episode, unspecified: Secondary | ICD-10-CM | POA: Diagnosis not present

## 2013-10-28 DIAGNOSIS — R269 Unspecified abnormalities of gait and mobility: Secondary | ICD-10-CM | POA: Diagnosis not present

## 2013-10-28 DIAGNOSIS — R413 Other amnesia: Secondary | ICD-10-CM | POA: Diagnosis not present

## 2013-10-28 DIAGNOSIS — F3289 Other specified depressive episodes: Secondary | ICD-10-CM | POA: Diagnosis not present

## 2013-10-28 DIAGNOSIS — M6281 Muscle weakness (generalized): Secondary | ICD-10-CM | POA: Diagnosis not present

## 2013-10-28 DIAGNOSIS — C250 Malignant neoplasm of head of pancreas: Secondary | ICD-10-CM | POA: Diagnosis not present

## 2013-10-29 ENCOUNTER — Ambulatory Visit (INDEPENDENT_AMBULATORY_CARE_PROVIDER_SITE_OTHER): Payer: Medicare Other | Admitting: Internal Medicine

## 2013-10-29 ENCOUNTER — Encounter: Payer: Self-pay | Admitting: Internal Medicine

## 2013-10-29 ENCOUNTER — Other Ambulatory Visit (INDEPENDENT_AMBULATORY_CARE_PROVIDER_SITE_OTHER): Payer: Medicare Other

## 2013-10-29 VITALS — BP 124/66 | HR 74 | Temp 98.5°F | Ht 62.0 in | Wt 153.1 lb

## 2013-10-29 DIAGNOSIS — R413 Other amnesia: Secondary | ICD-10-CM

## 2013-10-29 DIAGNOSIS — D649 Anemia, unspecified: Secondary | ICD-10-CM

## 2013-10-29 DIAGNOSIS — R110 Nausea: Secondary | ICD-10-CM

## 2013-10-29 DIAGNOSIS — G609 Hereditary and idiopathic neuropathy, unspecified: Secondary | ICD-10-CM | POA: Diagnosis not present

## 2013-10-29 DIAGNOSIS — G629 Polyneuropathy, unspecified: Secondary | ICD-10-CM

## 2013-10-29 DIAGNOSIS — R609 Edema, unspecified: Secondary | ICD-10-CM

## 2013-10-29 DIAGNOSIS — R7402 Elevation of levels of lactic acid dehydrogenase (LDH): Secondary | ICD-10-CM

## 2013-10-29 DIAGNOSIS — R740 Nonspecific elevation of levels of transaminase and lactic acid dehydrogenase [LDH]: Secondary | ICD-10-CM

## 2013-10-29 LAB — HEPATIC FUNCTION PANEL
ALK PHOS: 127 U/L — AB (ref 39–117)
ALT: 46 U/L — ABNORMAL HIGH (ref 0–35)
AST: 45 U/L — ABNORMAL HIGH (ref 0–37)
Albumin: 3.7 g/dL (ref 3.5–5.2)
BILIRUBIN DIRECT: 0.2 mg/dL (ref 0.0–0.3)
TOTAL PROTEIN: 6.6 g/dL (ref 6.0–8.3)
Total Bilirubin: 0.7 mg/dL (ref 0.2–1.2)

## 2013-10-29 LAB — CBC WITH DIFFERENTIAL/PLATELET
BASOS PCT: 0.4 % (ref 0.0–3.0)
Basophils Absolute: 0 10*3/uL (ref 0.0–0.1)
Eosinophils Absolute: 0.3 10*3/uL (ref 0.0–0.7)
Eosinophils Relative: 3.8 % (ref 0.0–5.0)
HCT: 38.5 % (ref 36.0–46.0)
HEMOGLOBIN: 13.1 g/dL (ref 12.0–15.0)
Lymphocytes Relative: 21.9 % (ref 12.0–46.0)
Lymphs Abs: 1.6 10*3/uL (ref 0.7–4.0)
MCHC: 34 g/dL (ref 30.0–36.0)
MCV: 92.9 fl (ref 78.0–100.0)
MONOS PCT: 8 % (ref 3.0–12.0)
Monocytes Absolute: 0.6 10*3/uL (ref 0.1–1.0)
NEUTROS ABS: 4.8 10*3/uL (ref 1.4–7.7)
NEUTROS PCT: 65.9 % (ref 43.0–77.0)
Platelets: 185 10*3/uL (ref 150.0–400.0)
RBC: 4.14 Mil/uL (ref 3.87–5.11)
RDW: 15.1 % (ref 11.5–15.5)
WBC: 7.4 10*3/uL (ref 4.0–10.5)

## 2013-10-29 LAB — BASIC METABOLIC PANEL
BUN: 8 mg/dL (ref 6–23)
CO2: 27 meq/L (ref 19–32)
Calcium: 9.2 mg/dL (ref 8.4–10.5)
Chloride: 108 mEq/L (ref 96–112)
Creatinine, Ser: 0.9 mg/dL (ref 0.4–1.2)
GFR: 64.2 mL/min (ref >60.00)
Glucose, Bld: 93 mg/dL (ref 70–99)
Potassium: 3.6 mEq/L (ref 3.5–5.1)
SODIUM: 144 meq/L (ref 135–145)

## 2013-10-29 LAB — TSH: TSH: 6.58 u[IU]/mL — ABNORMAL HIGH (ref 0.35–4.50)

## 2013-10-29 LAB — VITAMIN B12: Vitamin B-12: >1500 pg/mL — ABNORMAL HIGH (ref 211–911)

## 2013-10-29 MED ORDER — RANITIDINE HCL 150 MG PO CAPS: 150.0000 mg | ORAL_CAPSULE | Freq: Two times a day (BID) | ORAL | Status: DC

## 2013-10-29 NOTE — Progress Notes (Signed)
   Subjective:    Patient ID: [name], female    DOB: [birth year], 73 y.o.   MRN: [id number]  HPI  Her complicated history was reviewed. She underwent a Whipple procedure for pancreatic cancer 08/07/13. She was hospitalized until 09/12/13  & then discharged to the nursing home for rehabilitation.  She did return home on 5/26. At home nausea persists despite using Phenergan &  Zofran daily.  Her abdominal pain has improved and she is not requiring narcotic pain medicine.  There's been a total 7 # weight loss compared to her weight  preop. Despite the nausea she has been able to maintain good nutrition.  She & her son are requesting discontinuation of any nonessential medicines  She been on E-Mycin from her surgeon for possible infection. The exact indication is somewhat unclear. She was having nausea prior to starting the erythromycin.  She continued her statin while on erythromycin.  They're questioning refilling medications to stimulate appetite. Specifically she is on Marinol. Upon reference review, potential adverse effects of this medicine include amnesia, ataxia, and confusion.  According to her son she has had memory deficit since the surgery.   Her last TSH was in January of this year.   Review of Systems  She is describing some low back pain which began this morning  Additionally as I auscultated her chest she jumped stating that she a " nerve slipped" in the left arm. The arm was not being manipulated at the time.  When prompted she does describe numbness and tingling in her extremities.     Objective:   Physical Exam  She appears adequately nourished in no acute distress.  She has no scleral icterus She has ptosis of the left lid.   Upper plate; lower partial; no oropharyngeal lesions or erythema  No neck masses or LA Chest clear w/o abnormal BS or increased WOB RR w/o murmur or gallop  Bowel sounds are decreased. Abdomen is soft and nontender  She has  minimal-trace edema at the ankles  Straight leg raising is negative.  Strength and tone is normal in all extremities.  Deep tendon reflexes are 1/2+ at the knees  No jaundice or tenting.  She stated the date was "July?, 20/15."        Assessment & Plan:  #1 nausea in the context of combined erythromycin and statin therapy  #2 memory deficit, new by history. This could be additionally related to the appetite stimulant she's been taking  #3 Acute low back pain this morning with no neuromuscular deficit on exam  #4 Vague left upper extremity symptoms during auscultation of the chest  Plan: see orders

## 2013-10-29 NOTE — Progress Notes (Signed)
   Subjective:    Patient ID: [name], female    DOB: [birth year], 73 y.o.   MRN: [id number]  HPI Pt is here for f/u from [place] 08/07/13 by Dr. [name]. She was discharged 09/12/13 to a nursing home where she remained until she returned home on 10/22/13. Since her return home she has continued to have nausea and is using phenergan and zofran daily. She reports she has had minimal discomfort and not needed the percocet since she left home. She reports she has only lost 7lbs since before her surgery and is maintaining an adequate diet at home despite the nausea. Today she is requesting to be taken off of any possible medications. She questions the need for the E-mycin particularly. She has no f/u scheduled with Dr. [name]. She will f/u with her oncologist Dr. Learta Codding.   Today the pt complains of lower back pain that she woke up with. She also reports swelling of legs and feet, stuttering, short term memory loss, and difficulty forming sentences that all began after her surgery.    Review of Systems  Constitutional: Negative for fever.  Respiratory: Negative for shortness of breath.   Cardiovascular: Positive for leg swelling. Negative for chest pain.  Gastrointestinal: Positive for nausea. Negative for vomiting, diarrhea, constipation and blood in stool.  Neurological: Positive for numbness.  Psychiatric/Behavioral: Positive for confusion.       Pt reports short term memory loss       Objective:   Physical Exam Gen.: Healthy and well-nourished in appearance. Alert, appropriate and cooperative throughout exam. Head: Normocephalic without obvious abnormalities Eyes: No corneal or conjunctival inflammation noted. Pupils equal round reactive to light and accommodation. Extraocular motion intact.  Mouth: Oral mucosa and oropharynx reveal no lesions or exudates. Teeth in good repair. Neck: No deformities, masses, or tenderness noted. No thyromegaly  Lungs: Normal respiratory effort; chest  expands symmetrically. Lungs are clear to auscultation without rales, wheezes, or increased work of breathing. Heart: Normal rate and rhythm. Normal S1 and S2. No gallop, click, or rub. No murmur. Able to lie down & sit up w/o help. Negative SLR bilaterally Vascular: radial artery, dorsalis pedis are full and equal. Neurologic: Alert and oriented x2. DTRs present and symmetric. Pt cannot recall date, but can state name and president of the [place].  Psych: Mood and affect are normal. Pt is slow to respond to questions at times.                                                                                Assessment & Plan:  Plan: will STOP marinol and Erythromycin. Marinol SE panel may be causing symptoms of confusion, amnesia. Erythromycin may be contributing to nausea. Discussed these SE with pt and her son. Both verbalized understanding of plan. Encouraged pt maintain hydration and nutrition.  Pt asked about driving. Advised pt to wait until AMS resolved and until pt is no longer taking phenergan on a daily basis.   Check kidney and liver function, TSH, B12, and CBC. Will f/u based on results.

## 2013-10-29 NOTE — Patient Instructions (Signed)
Reflux of gastric acid may be asymptomatic as this may occur mainly during sleep.The triggers for reflux  include stress; the "aspirin family" ; alcohol; peppermint; and caffeine (coffee, tea, cola, and chocolate). The aspirin family would include aspirin and the nonsteroidal agents such as ibuprofen &  Naproxen. Tylenol would not cause reflux. If having symptoms ; food & drink should be avoided for @ least 2 hours before going to bed.  

## 2013-10-29 NOTE — Progress Notes (Signed)
Pre visit review using our clinic review tool, if applicable. No additional management support is needed unless otherwise documented below in the visit note. 

## 2013-10-30 ENCOUNTER — Other Ambulatory Visit: Payer: Self-pay | Admitting: Internal Medicine

## 2013-10-30 DIAGNOSIS — R413 Other amnesia: Secondary | ICD-10-CM | POA: Diagnosis not present

## 2013-10-30 DIAGNOSIS — C250 Malignant neoplasm of head of pancreas: Secondary | ICD-10-CM | POA: Diagnosis not present

## 2013-10-30 DIAGNOSIS — R269 Unspecified abnormalities of gait and mobility: Secondary | ICD-10-CM | POA: Diagnosis not present

## 2013-10-30 DIAGNOSIS — F3289 Other specified depressive episodes: Secondary | ICD-10-CM | POA: Diagnosis not present

## 2013-10-30 DIAGNOSIS — M6281 Muscle weakness (generalized): Secondary | ICD-10-CM | POA: Diagnosis not present

## 2013-10-30 DIAGNOSIS — F329 Major depressive disorder, single episode, unspecified: Secondary | ICD-10-CM | POA: Diagnosis not present

## 2013-10-30 DIAGNOSIS — R7401 Elevation of levels of liver transaminase levels: Secondary | ICD-10-CM

## 2013-10-30 DIAGNOSIS — R740 Nonspecific elevation of levels of transaminase and lactic acid dehydrogenase [LDH]: Principal | ICD-10-CM

## 2013-10-30 DIAGNOSIS — Z483 Aftercare following surgery for neoplasm: Secondary | ICD-10-CM | POA: Diagnosis not present

## 2013-10-30 DIAGNOSIS — E039 Hypothyroidism, unspecified: Secondary | ICD-10-CM

## 2013-10-31 ENCOUNTER — Telehealth: Payer: Self-pay | Admitting: *Deleted

## 2013-10-31 DIAGNOSIS — R413 Other amnesia: Secondary | ICD-10-CM | POA: Diagnosis not present

## 2013-10-31 DIAGNOSIS — F329 Major depressive disorder, single episode, unspecified: Secondary | ICD-10-CM | POA: Diagnosis not present

## 2013-10-31 DIAGNOSIS — R269 Unspecified abnormalities of gait and mobility: Secondary | ICD-10-CM | POA: Diagnosis not present

## 2013-10-31 DIAGNOSIS — F3289 Other specified depressive episodes: Secondary | ICD-10-CM | POA: Diagnosis not present

## 2013-10-31 DIAGNOSIS — Z483 Aftercare following surgery for neoplasm: Secondary | ICD-10-CM | POA: Diagnosis not present

## 2013-10-31 DIAGNOSIS — C250 Malignant neoplasm of head of pancreas: Secondary | ICD-10-CM | POA: Diagnosis not present

## 2013-10-31 DIAGNOSIS — M6281 Muscle weakness (generalized): Secondary | ICD-10-CM | POA: Diagnosis not present

## 2013-10-31 NOTE — Telephone Encounter (Signed)
Received home health certification and plan of care via fax from [place]. Forms forwarded to [facility] office. JG//CMA

## 2013-11-07 DIAGNOSIS — F329 Major depressive disorder, single episode, unspecified: Secondary | ICD-10-CM | POA: Diagnosis not present

## 2013-11-07 DIAGNOSIS — F3289 Other specified depressive episodes: Secondary | ICD-10-CM | POA: Diagnosis not present

## 2013-11-07 DIAGNOSIS — R413 Other amnesia: Secondary | ICD-10-CM | POA: Diagnosis not present

## 2013-11-07 DIAGNOSIS — R269 Unspecified abnormalities of gait and mobility: Secondary | ICD-10-CM | POA: Diagnosis not present

## 2013-11-07 DIAGNOSIS — Z483 Aftercare following surgery for neoplasm: Secondary | ICD-10-CM | POA: Diagnosis not present

## 2013-11-07 DIAGNOSIS — C250 Malignant neoplasm of head of pancreas: Secondary | ICD-10-CM | POA: Diagnosis not present

## 2013-11-07 DIAGNOSIS — M6281 Muscle weakness (generalized): Secondary | ICD-10-CM | POA: Diagnosis not present

## 2013-11-11 ENCOUNTER — Other Ambulatory Visit: Payer: Self-pay

## 2013-11-11 DIAGNOSIS — F329 Major depressive disorder, single episode, unspecified: Secondary | ICD-10-CM

## 2013-11-11 DIAGNOSIS — R413 Other amnesia: Secondary | ICD-10-CM

## 2013-11-11 DIAGNOSIS — Z483 Aftercare following surgery for neoplasm: Secondary | ICD-10-CM

## 2013-11-11 DIAGNOSIS — R269 Unspecified abnormalities of gait and mobility: Secondary | ICD-10-CM | POA: Diagnosis not present

## 2013-11-11 DIAGNOSIS — F3289 Other specified depressive episodes: Secondary | ICD-10-CM

## 2013-11-11 DIAGNOSIS — C250 Malignant neoplasm of head of pancreas: Secondary | ICD-10-CM

## 2013-11-11 DIAGNOSIS — M6281 Muscle weakness (generalized): Secondary | ICD-10-CM

## 2013-11-11 MED ORDER — SUCRALFATE 1 GM/10ML PO SUSP: 1.0000 g | Freq: Three times a day (TID) | ORAL | Status: DC

## 2013-11-12 ENCOUNTER — Telehealth: Payer: Self-pay

## 2013-11-12 NOTE — Telephone Encounter (Signed)
rf request sent in for carafate 1 gm/ 19ml suspension, pt would like this changes to tablets because it is cheaper through her insurance. Please advise

## 2013-11-13 MED ORDER — SUCRALFATE 1 G PO TABS: 1.0000 g | ORAL_TABLET | Freq: Three times a day (TID) | ORAL | Status: DC

## 2013-11-13 NOTE — Telephone Encounter (Signed)
Ok done Thx 

## 2013-11-14 DIAGNOSIS — R413 Other amnesia: Secondary | ICD-10-CM | POA: Diagnosis not present

## 2013-11-14 DIAGNOSIS — C250 Malignant neoplasm of head of pancreas: Secondary | ICD-10-CM | POA: Diagnosis not present

## 2013-11-14 DIAGNOSIS — R269 Unspecified abnormalities of gait and mobility: Secondary | ICD-10-CM | POA: Diagnosis not present

## 2013-11-14 DIAGNOSIS — M6281 Muscle weakness (generalized): Secondary | ICD-10-CM | POA: Diagnosis not present

## 2013-11-14 DIAGNOSIS — Z483 Aftercare following surgery for neoplasm: Secondary | ICD-10-CM | POA: Diagnosis not present

## 2013-11-14 DIAGNOSIS — F3289 Other specified depressive episodes: Secondary | ICD-10-CM | POA: Diagnosis not present

## 2013-11-14 DIAGNOSIS — F329 Major depressive disorder, single episode, unspecified: Secondary | ICD-10-CM | POA: Diagnosis not present

## 2013-11-16 DIAGNOSIS — M6281 Muscle weakness (generalized): Secondary | ICD-10-CM | POA: Diagnosis not present

## 2013-11-16 DIAGNOSIS — F3289 Other specified depressive episodes: Secondary | ICD-10-CM | POA: Diagnosis not present

## 2013-11-16 DIAGNOSIS — Z483 Aftercare following surgery for neoplasm: Secondary | ICD-10-CM | POA: Diagnosis not present

## 2013-11-16 DIAGNOSIS — F329 Major depressive disorder, single episode, unspecified: Secondary | ICD-10-CM | POA: Diagnosis not present

## 2013-11-16 DIAGNOSIS — R413 Other amnesia: Secondary | ICD-10-CM | POA: Diagnosis not present

## 2013-11-16 DIAGNOSIS — R269 Unspecified abnormalities of gait and mobility: Secondary | ICD-10-CM | POA: Diagnosis not present

## 2013-11-16 DIAGNOSIS — C250 Malignant neoplasm of head of pancreas: Secondary | ICD-10-CM | POA: Diagnosis not present

## 2013-11-19 DIAGNOSIS — M6281 Muscle weakness (generalized): Secondary | ICD-10-CM | POA: Diagnosis not present

## 2013-11-19 DIAGNOSIS — C250 Malignant neoplasm of head of pancreas: Secondary | ICD-10-CM | POA: Diagnosis not present

## 2013-11-19 DIAGNOSIS — R413 Other amnesia: Secondary | ICD-10-CM | POA: Diagnosis not present

## 2013-11-19 DIAGNOSIS — Z483 Aftercare following surgery for neoplasm: Secondary | ICD-10-CM | POA: Diagnosis not present

## 2013-11-19 DIAGNOSIS — F329 Major depressive disorder, single episode, unspecified: Secondary | ICD-10-CM | POA: Diagnosis not present

## 2013-11-19 DIAGNOSIS — F3289 Other specified depressive episodes: Secondary | ICD-10-CM | POA: Diagnosis not present

## 2013-11-19 DIAGNOSIS — R269 Unspecified abnormalities of gait and mobility: Secondary | ICD-10-CM | POA: Diagnosis not present

## 2013-11-22 DIAGNOSIS — R269 Unspecified abnormalities of gait and mobility: Secondary | ICD-10-CM | POA: Diagnosis not present

## 2013-11-22 DIAGNOSIS — M6281 Muscle weakness (generalized): Secondary | ICD-10-CM | POA: Diagnosis not present

## 2013-11-22 DIAGNOSIS — F329 Major depressive disorder, single episode, unspecified: Secondary | ICD-10-CM | POA: Diagnosis not present

## 2013-11-22 DIAGNOSIS — R413 Other amnesia: Secondary | ICD-10-CM | POA: Diagnosis not present

## 2013-11-22 DIAGNOSIS — C250 Malignant neoplasm of head of pancreas: Secondary | ICD-10-CM | POA: Diagnosis not present

## 2013-11-22 DIAGNOSIS — Z483 Aftercare following surgery for neoplasm: Secondary | ICD-10-CM | POA: Diagnosis not present

## 2013-11-22 DIAGNOSIS — F3289 Other specified depressive episodes: Secondary | ICD-10-CM | POA: Diagnosis not present

## 2013-11-26 DIAGNOSIS — M6281 Muscle weakness (generalized): Secondary | ICD-10-CM | POA: Diagnosis not present

## 2013-11-26 DIAGNOSIS — R269 Unspecified abnormalities of gait and mobility: Secondary | ICD-10-CM | POA: Diagnosis not present

## 2013-11-26 DIAGNOSIS — C250 Malignant neoplasm of head of pancreas: Secondary | ICD-10-CM | POA: Diagnosis not present

## 2013-11-26 DIAGNOSIS — F329 Major depressive disorder, single episode, unspecified: Secondary | ICD-10-CM | POA: Diagnosis not present

## 2013-11-26 DIAGNOSIS — Z483 Aftercare following surgery for neoplasm: Secondary | ICD-10-CM | POA: Diagnosis not present

## 2013-11-26 DIAGNOSIS — F3289 Other specified depressive episodes: Secondary | ICD-10-CM | POA: Diagnosis not present

## 2013-11-26 DIAGNOSIS — R413 Other amnesia: Secondary | ICD-10-CM | POA: Diagnosis not present

## 2013-11-27 ENCOUNTER — Other Ambulatory Visit (INDEPENDENT_AMBULATORY_CARE_PROVIDER_SITE_OTHER): Payer: Medicare Other

## 2013-11-27 ENCOUNTER — Encounter: Payer: Self-pay | Admitting: Internal Medicine

## 2013-11-27 ENCOUNTER — Ambulatory Visit (INDEPENDENT_AMBULATORY_CARE_PROVIDER_SITE_OTHER): Payer: Medicare Other | Admitting: Internal Medicine

## 2013-11-27 VITALS — BP 154/90 | HR 71 | Temp 97.7°F | Wt 147.0 lb

## 2013-11-27 DIAGNOSIS — R634 Abnormal weight loss: Secondary | ICD-10-CM

## 2013-11-27 DIAGNOSIS — M1710 Unilateral primary osteoarthritis, unspecified knee: Secondary | ICD-10-CM

## 2013-11-27 DIAGNOSIS — R110 Nausea: Secondary | ICD-10-CM

## 2013-11-27 DIAGNOSIS — R233 Spontaneous ecchymoses: Secondary | ICD-10-CM | POA: Diagnosis not present

## 2013-11-27 DIAGNOSIS — M1712 Unilateral primary osteoarthritis, left knee: Secondary | ICD-10-CM

## 2013-11-27 DIAGNOSIS — I10 Essential (primary) hypertension: Secondary | ICD-10-CM

## 2013-11-27 LAB — HEPATIC FUNCTION PANEL
ALK PHOS: 115 U/L (ref 39–117)
ALT: 37 U/L — AB (ref 0–35)
AST: 38 U/L — ABNORMAL HIGH (ref 0–37)
Albumin: 3.8 g/dL (ref 3.5–5.2)
Bilirubin, Direct: 0.2 mg/dL (ref 0.0–0.3)
TOTAL PROTEIN: 6.8 g/dL (ref 6.0–8.3)
Total Bilirubin: 0.6 mg/dL (ref 0.2–1.2)

## 2013-11-27 LAB — CBC WITH DIFFERENTIAL/PLATELET
BASOS PCT: 0.4 % (ref 0.0–3.0)
Basophils Absolute: 0 10*3/uL (ref 0.0–0.1)
Eosinophils Absolute: 0.2 10*3/uL (ref 0.0–0.7)
Eosinophils Relative: 3.3 % (ref 0.0–5.0)
HEMATOCRIT: 38.8 % (ref 36.0–46.0)
Hemoglobin: 13.3 g/dL (ref 12.0–15.0)
LYMPHS ABS: 1.6 10*3/uL (ref 0.7–4.0)
Lymphocytes Relative: 21.4 % (ref 12.0–46.0)
MCHC: 34.3 g/dL (ref 30.0–36.0)
MCV: 93.2 fl (ref 78.0–100.0)
Monocytes Absolute: 0.6 10*3/uL (ref 0.1–1.0)
Monocytes Relative: 8.4 % (ref 3.0–12.0)
Neutro Abs: 4.9 10*3/uL (ref 1.4–7.7)
Neutrophils Relative %: 66.5 % (ref 43.0–77.0)
PLATELETS: 193 10*3/uL (ref 150.0–400.0)
RBC: 4.16 Mil/uL (ref 3.87–5.11)
RDW: 14.7 % (ref 11.5–15.5)
WBC: 7.4 10*3/uL (ref 4.0–10.5)

## 2013-11-27 LAB — BASIC METABOLIC PANEL
BUN: 11 mg/dL (ref 6–23)
CHLORIDE: 106 meq/L (ref 96–112)
CO2: 27 meq/L (ref 19–32)
CREATININE: 0.8 mg/dL (ref 0.4–1.2)
Calcium: 9.2 mg/dL (ref 8.4–10.5)
GFR: 72.38 mL/min (ref >60.00)
Glucose, Bld: 113 mg/dL — ABNORMAL HIGH (ref 70–99)
Potassium: 3.8 mEq/L (ref 3.5–5.1)
Sodium: 141 mEq/L (ref 135–145)

## 2013-11-27 LAB — TSH: TSH: 1.5 u[IU]/mL (ref 0.35–4.50)

## 2013-11-27 NOTE — Progress Notes (Signed)
Pre visit review using our clinic review tool, if applicable. No additional management support is needed unless otherwise documented below in the visit note. 

## 2013-11-27 NOTE — Progress Notes (Signed)
   Subjective:    Patient ID: [name], female    DOB: Feb 29, 1941, 73 y.o.   MRN: [id number]  HPI  She is here with multiple complaints; her major concern is a "rash" of the lower extremities. Actually she is demonstrating scattered minor petechiae on the lower extremity. These are described as nonpruritic painful. She is on 81 mg of aspirin.  She also describes pain in the knees. She apparently fell on the left knee in the hospital and has had some pain since. She's had previous surgery on that knee  She describes nausea despite taking omeprazole twice a day and ranitidine twice a day  She describes right perinasal headache discomfort and left temporal headache the discomfort.      Review of Systems  She describes both polyphagia as well as polydipsia.  She is not complaining of dysuria, pyuria, or hematuria  She is not describing melena or rectal bleeding. She has lost 16 pounds since February following the abdominal surgery for pancreatitic cancer.  She states she is having tenderness @ the operative site.     Objective:   Physical Exam  Significant or distinguishing  findings on physical exam are documented first.  Below that are other systems examined & findings.   Ptosis of the eyelids is present bilaterally. Pallor of the skin is suggested. Upper plate lower partial There is accentuated curvature of the upper thoracic spine The operative scar over the abdomen is well healed with no sign of cellulitis There is marked crepitus of the left knee as well as effusion Affect is markedly flat. She did actually laugh when she was asking about returning to driving. Apparently her surgeon had told her not to drive postoperatively.  General appearance is one of good health and nourishment w/o distress.  Eyes: No conjunctival inflammation or scleral icterus is present.  Oral exam: lips and gums are healthy appearing.There is no oropharyngeal erythema or exudate noted.    Heart:  Normal rate and regular rhythm. S1 and S2 normal without gallop, murmur, click, rub or other extra sounds S4 with slurring at  LSB  Lungs:Chest clear to auscultation; no wheezes, rhonchi,rales ,or rubs present.No increased work of breathing.   Abdomen: bowel sounds normal, soft and non-tender without masses, organomegaly or hernias noted.  No guarding or rebound .   Musculoskeletal: Able to lie flat and sit up without help. Negative straight leg raising bilaterally. Gait normal  Skin:Warm & dry.  Intact without suspicious lesions or rashes ; no jaundice or tenting. Petechiae as noted over BLE  Lymphatic: No lymphadenopathy is noted about the head, neck, axilla             Assessment & Plan:  #1 petechiae; CBC and differential will be checked  #2 weight loss in the setting of surgery for pancreatic cancer. 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 Topical agents will be used in attempt to avoid any agents which will aggravate any gastritis  #4 hypertension; atenolol will be increased to one half of 25 mg twice a day. She is presently only taking half a pill  #5 nausea despite both omeprazole and ranitidine. Ranitidine will be held

## 2013-11-27 NOTE — Patient Instructions (Addendum)
Reflux of gastric acid may be asymptomatic as this may occur mainly during sleep.The triggers for reflux  include stress; the "aspirin family" ; alcohol; peppermint; and caffeine (coffee, tea, cola, and chocolate). The aspirin family would include aspirin and the nonsteroidal agents such as ibuprofen &  Naproxen. Tylenol would not cause reflux. If having symptoms ; food & drink should be avoided for @ least 2 hours before going to bed.   Use an anti-inflammatory cream such as Aspercreme or Zostrix cream twice a day to the affected area as needed. In lieu of this warm moist compresses or  hot water bottle can be used. Do not apply ice .  Temporarily stop the following medications until notified to restart these: Zantac twice a day.  Take Atenolol 25 mg 1/2 pill twice a day

## 2013-11-28 ENCOUNTER — Telehealth: Payer: Self-pay

## 2013-11-28 ENCOUNTER — Telehealth: Payer: Self-pay | Admitting: Internal Medicine

## 2013-11-28 ENCOUNTER — Ambulatory Visit: Payer: Medicare Other

## 2013-11-28 DIAGNOSIS — R413 Other amnesia: Secondary | ICD-10-CM | POA: Diagnosis not present

## 2013-11-28 DIAGNOSIS — R7309 Other abnormal glucose: Secondary | ICD-10-CM

## 2013-11-28 DIAGNOSIS — C250 Malignant neoplasm of head of pancreas: Secondary | ICD-10-CM | POA: Diagnosis not present

## 2013-11-28 DIAGNOSIS — M6281 Muscle weakness (generalized): Secondary | ICD-10-CM | POA: Diagnosis not present

## 2013-11-28 DIAGNOSIS — F329 Major depressive disorder, single episode, unspecified: Secondary | ICD-10-CM | POA: Diagnosis not present

## 2013-11-28 DIAGNOSIS — F3289 Other specified depressive episodes: Secondary | ICD-10-CM | POA: Diagnosis not present

## 2013-11-28 DIAGNOSIS — Z483 Aftercare following surgery for neoplasm: Secondary | ICD-10-CM | POA: Diagnosis not present

## 2013-11-28 DIAGNOSIS — R269 Unspecified abnormalities of gait and mobility: Secondary | ICD-10-CM | POA: Diagnosis not present

## 2013-11-28 LAB — HEMOGLOBIN A1C: Hgb A1c MFr Bld: 5 % (ref 4.6–6.5)

## 2013-11-28 NOTE — Telephone Encounter (Signed)
Message copied by Shelly Coss on Thu Nov 28, 2013  8:43 AM ------      Message from: Hendricks Limes      Created: Wed Nov 27, 2013  7:16 PM       Please add A1c (790.29)       ------

## 2013-11-28 NOTE — Telephone Encounter (Signed)
Add on request faxed to lab.  

## 2013-11-28 NOTE — Telephone Encounter (Signed)
Relevant patient education assigned to patient using Emmi. ° °

## 2013-11-29 ENCOUNTER — Encounter: Payer: Self-pay | Admitting: Internal Medicine

## 2013-11-29 DIAGNOSIS — R7309 Other abnormal glucose: Secondary | ICD-10-CM | POA: Insufficient documentation

## 2013-12-03 DIAGNOSIS — C250 Malignant neoplasm of head of pancreas: Secondary | ICD-10-CM | POA: Diagnosis not present

## 2013-12-03 DIAGNOSIS — M6281 Muscle weakness (generalized): Secondary | ICD-10-CM | POA: Diagnosis not present

## 2013-12-03 DIAGNOSIS — Z483 Aftercare following surgery for neoplasm: Secondary | ICD-10-CM | POA: Diagnosis not present

## 2013-12-03 DIAGNOSIS — F3289 Other specified depressive episodes: Secondary | ICD-10-CM | POA: Diagnosis not present

## 2013-12-03 DIAGNOSIS — F329 Major depressive disorder, single episode, unspecified: Secondary | ICD-10-CM | POA: Diagnosis not present

## 2013-12-03 DIAGNOSIS — R413 Other amnesia: Secondary | ICD-10-CM | POA: Diagnosis not present

## 2013-12-03 DIAGNOSIS — R269 Unspecified abnormalities of gait and mobility: Secondary | ICD-10-CM | POA: Diagnosis not present

## 2013-12-06 ENCOUNTER — Telehealth: Payer: Self-pay | Admitting: Oncology

## 2013-12-06 ENCOUNTER — Ambulatory Visit (HOSPITAL_BASED_OUTPATIENT_CLINIC_OR_DEPARTMENT_OTHER): Payer: Medicare Other | Admitting: Oncology

## 2013-12-06 VITALS — BP 141/69 | HR 92 | Temp 97.4°F | Resp 18 | Ht 62.0 in | Wt 145.7 lb

## 2013-12-06 DIAGNOSIS — C250 Malignant neoplasm of head of pancreas: Secondary | ICD-10-CM | POA: Diagnosis not present

## 2013-12-06 DIAGNOSIS — D696 Thrombocytopenia, unspecified: Secondary | ICD-10-CM

## 2013-12-06 NOTE — Progress Notes (Signed)
Met with patient and son.  Patient is s/p Whipple and is doing well.  She is living alone, but her son checks on her regularly.  Patient requested a visit with dietician and referral was made.  No other needs identified at this time.

## 2013-12-06 NOTE — Telephone Encounter (Signed)
Gave pt appt for nutrition this month and November 2015

## 2013-12-06 NOTE — Progress Notes (Signed)
  [place] OFFICE PROGRESS NOTE   Diagnosis: Pancreas cancer  INTERVAL HISTORY:   Ms. Reeser underwent a pancreaticoduodenectomy 08/07/2013 by Dr. [name]. She had a prolonged postoperative course and was discharged to a rehabilitation facility on 09/11/2013. She had delayed gastric emptying following surgery. She reports recent improvement in nausea and has returned home over the past month. She was unable to keep scheduled appointments at the [facility] following surgery.  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 Perineural invasion was identified. Adenocarcinoma extended into the peripancreatic soft tissue. Negative resection margins. No evidence of carcinoma in 12 lymph nodes.  She has returned home and is living alone. She reports multiple nodules over the trunk and extremities. These have been present chronically.  Objective:  Vital signs in last 24 hours:  Blood pressure 141/69, pulse 92, temperature 97.4 F (36.3 C), temperature source Oral, resp. rate 18, height 5\' 2"  (1.575 m), weight 145 lb 11.2 oz (66.089 kg).    HEENT: Neck without mass Lymphatics: No cervical, supraclavicular, or axillary nodes Resp: Lungs clear bilaterally Cardio: Regular rate and rhythm GI: No hepatomegaly, mild diffuse tenderness, no mass, no apparent ascites. Vascular: No leg edema  Skin: Soft 1 cm or less mobile cutaneous lesions at the right thigh and left arm consistent with lipomas    Lab Results:  Lab Results  Component Value Date   WBC 7.4 11/27/2013   HGB 13.3 11/27/2013   HCT 38.8 11/27/2013   MCV 93.2 11/27/2013   PLT 193.0 11/27/2013   NEUTROABS 4.9 11/27/2013   CA 19-9  07/23/2013-2.8  Medications: I have reviewed the patient's current medications.  Assessment/Plan: 1. 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 nausea following the Whipple procedure-improved 3. Mild thrombocytopenia on a CBC 07/01/2013    Disposition:  [name] is recovering from the pancreaticoduodenectomy procedure. She had a prolonged postoperative hospital stay followed by a rehabilitation stay. I do not recommend adjuvant therapy since she is now 4 months out from surgery. There is no clinical evidence of disease progression. She will return for an office visit in 4 months.  She will see Dr. [name] next month. The cutaneous nodular lesions appear to be lipomas.  Betsy Coder, MD  12/06/2013  8:53 AM

## 2013-12-11 DIAGNOSIS — C250 Malignant neoplasm of head of pancreas: Secondary | ICD-10-CM | POA: Diagnosis not present

## 2013-12-11 DIAGNOSIS — F3289 Other specified depressive episodes: Secondary | ICD-10-CM | POA: Diagnosis not present

## 2013-12-11 DIAGNOSIS — Z483 Aftercare following surgery for neoplasm: Secondary | ICD-10-CM | POA: Diagnosis not present

## 2013-12-11 DIAGNOSIS — M6281 Muscle weakness (generalized): Secondary | ICD-10-CM | POA: Diagnosis not present

## 2013-12-11 DIAGNOSIS — F329 Major depressive disorder, single episode, unspecified: Secondary | ICD-10-CM | POA: Diagnosis not present

## 2013-12-11 DIAGNOSIS — R413 Other amnesia: Secondary | ICD-10-CM | POA: Diagnosis not present

## 2013-12-11 DIAGNOSIS — R269 Unspecified abnormalities of gait and mobility: Secondary | ICD-10-CM | POA: Diagnosis not present

## 2013-12-13 ENCOUNTER — Telehealth: Payer: Self-pay | Admitting: Neurology

## 2013-12-13 DIAGNOSIS — C250 Malignant neoplasm of head of pancreas: Secondary | ICD-10-CM | POA: Diagnosis not present

## 2013-12-13 DIAGNOSIS — R269 Unspecified abnormalities of gait and mobility: Secondary | ICD-10-CM | POA: Diagnosis not present

## 2013-12-13 DIAGNOSIS — F3289 Other specified depressive episodes: Secondary | ICD-10-CM | POA: Diagnosis not present

## 2013-12-13 DIAGNOSIS — Z483 Aftercare following surgery for neoplasm: Secondary | ICD-10-CM | POA: Diagnosis not present

## 2013-12-13 DIAGNOSIS — M6281 Muscle weakness (generalized): Secondary | ICD-10-CM | POA: Diagnosis not present

## 2013-12-13 DIAGNOSIS — F329 Major depressive disorder, single episode, unspecified: Secondary | ICD-10-CM | POA: Diagnosis not present

## 2013-12-13 DIAGNOSIS — R413 Other amnesia: Secondary | ICD-10-CM | POA: Diagnosis not present

## 2013-12-13 NOTE — Telephone Encounter (Signed)
I called back.  Spoke with [facility].  She asked if we had ever sent them a Rx for compounding cream.  By viewing chart, it appears a Rx was previously sent to [facility] in June of last year.  At [facility] in August patient indicated it was not effective and made her skin peel, at that point she was started on Gabapentin.  The pharmacy just wanted to ensure they did not misplace the Rx.  Nothing further is needed at this time.  Patient does have an appt scheduled next month.

## 2013-12-13 NOTE — Telephone Encounter (Signed)
Betsy with [facility] [phone] ext [phone] - calling in regards to patient wanting to fill an older prescription for a foot cream - pharmacist has questions please call. If you cannot reach Gwinda Passe you can ask for [place].

## 2013-12-17 DIAGNOSIS — R413 Other amnesia: Secondary | ICD-10-CM | POA: Diagnosis not present

## 2013-12-17 DIAGNOSIS — C250 Malignant neoplasm of head of pancreas: Secondary | ICD-10-CM | POA: Diagnosis not present

## 2013-12-17 DIAGNOSIS — F3289 Other specified depressive episodes: Secondary | ICD-10-CM | POA: Diagnosis not present

## 2013-12-17 DIAGNOSIS — F329 Major depressive disorder, single episode, unspecified: Secondary | ICD-10-CM | POA: Diagnosis not present

## 2013-12-17 DIAGNOSIS — Z483 Aftercare following surgery for neoplasm: Secondary | ICD-10-CM | POA: Diagnosis not present

## 2013-12-17 DIAGNOSIS — R269 Unspecified abnormalities of gait and mobility: Secondary | ICD-10-CM | POA: Diagnosis not present

## 2013-12-17 DIAGNOSIS — M6281 Muscle weakness (generalized): Secondary | ICD-10-CM | POA: Diagnosis not present

## 2013-12-19 DIAGNOSIS — R413 Other amnesia: Secondary | ICD-10-CM | POA: Diagnosis not present

## 2013-12-19 DIAGNOSIS — M6281 Muscle weakness (generalized): Secondary | ICD-10-CM | POA: Diagnosis not present

## 2013-12-19 DIAGNOSIS — Z483 Aftercare following surgery for neoplasm: Secondary | ICD-10-CM | POA: Diagnosis not present

## 2013-12-19 DIAGNOSIS — F329 Major depressive disorder, single episode, unspecified: Secondary | ICD-10-CM | POA: Diagnosis not present

## 2013-12-19 DIAGNOSIS — R269 Unspecified abnormalities of gait and mobility: Secondary | ICD-10-CM | POA: Diagnosis not present

## 2013-12-19 DIAGNOSIS — F3289 Other specified depressive episodes: Secondary | ICD-10-CM | POA: Diagnosis not present

## 2013-12-19 DIAGNOSIS — C250 Malignant neoplasm of head of pancreas: Secondary | ICD-10-CM | POA: Diagnosis not present

## 2013-12-20 ENCOUNTER — Ambulatory Visit: Payer: Medicare Other | Admitting: Nutrition

## 2013-12-20 NOTE — Progress Notes (Signed)
73 year old female diagnosed with pancreas cancer, January 2015.  Patient is status post Whipple procedure in March 2015.  No further treatment planned.  She is a patient of Dr. [name].  Past medical history includes osteoarthritis, hiatal hernia, depression, GERD, hypertension, hypothyroidism, and stroke.  Medications include Mylanta, vitamin D, Colace, Prozac, Synthroid, Creon, Ativan, Mevacor, multivitamin, Zofran, and Phenergan.  Labs include HG A1c a 5.0.  Height: 62 inches. Weight: 146.2 pounds July 24. Usual body weight: 162 pounds March 2015. BMI: 26.74.    Estimated nutrition needs: 1800-2000 calories, 75-85 g protein, 2.0 L fluid.  Patient denies nausea, vomiting, constipation, diarrhea, or poor appetite.  Dietary recall reveals patient consumes very small meals; approximately 500 calories consumed yesterday.  Patient does not typically snack.  Nutrition diagnosis: Unintended weight loss related to pancreatic cancer and Whipple procedure as evidenced by 10% weight loss in 4 months.  Patient meets criteria for severe malnutrition in the context of chronic illness secondary to greater than 7-1/2% weight loss over 3 months and less than 75% of energy intake for greater than one month.  Intervention: Patient educated to increase content of meals and add snacks between meals as tolerated.  Reviewed strategies for adding calories and protein.  Provided specific foods lists for examples of snacks.  Also encouraged patient to try an oral nutrition supplement.  Provided samples of oral nutrition supplements and fact sheets.  Questions were answered and teach back method used.  My contact information was given.  Monitoring, evaluation, goals: Patient will increase oral intake of calories and protein to promote weight maintenance.  Next visit: Patient to call me with questions or concerns.  **Disclaimer: This note was dictated with voice recognition software. Similar sounding words can  inadvertently be transcribed and this note may contain transcription errors which may not have been corrected upon publication of note.**

## 2013-12-30 ENCOUNTER — Encounter (INDEPENDENT_AMBULATORY_CARE_PROVIDER_SITE_OTHER): Payer: Self-pay | Admitting: General Surgery

## 2013-12-30 ENCOUNTER — Ambulatory Visit: Payer: Medicare Other | Admitting: Family Medicine

## 2013-12-30 ENCOUNTER — Ambulatory Visit (INDEPENDENT_AMBULATORY_CARE_PROVIDER_SITE_OTHER): Payer: Medicare Other | Admitting: General Surgery

## 2013-12-30 VITALS — BP 132/88 | HR 64 | Temp 97.1°F | Resp 16 | Ht 62.0 in | Wt 143.6 lb

## 2013-12-30 DIAGNOSIS — K8689 Other specified diseases of pancreas: Secondary | ICD-10-CM | POA: Diagnosis not present

## 2013-12-30 DIAGNOSIS — C250 Malignant neoplasm of head of pancreas: Secondary | ICD-10-CM

## 2013-12-30 DIAGNOSIS — K8681 Exocrine pancreatic insufficiency: Secondary | ICD-10-CM | POA: Insufficient documentation

## 2013-12-30 MED ORDER — PANCRELIPASE (LIP-PROT-AMYL) 12000-38000 UNITS PO CPEP: 24000.0000 [IU] | ORAL_CAPSULE | Freq: Three times a day (TID) | ORAL | Status: DC

## 2013-12-30 NOTE — Progress Notes (Signed)
HISTORY: Pt is around 4 months s/p whipple for pancreatic cancer.  She had a prolonged post op period with delayed gastric emptying.  She is doing better finally.  She went to rehab post op.  She is having a lot of gas pain.  She is also having a pins and needles sensation around her incision.  She is having 3-4 soft stools per day and is passing a lot of gas.    ROS:  Otherwise negative x 11.    EXAM: General:  Alert and oriented.   Incision:  Healing well.  Sl tender.  No hernia evident.      PATHOLOGY: 1. Pancreas, resection, Pancreaticoduodenectomy with gallbladder - INVASIVE ADENOCARCINOMA, MODERATELY DIFFERENTIATED, SPANNING 1.5 CM. - PERINEURAL INVASION IS IDENTIFIED. - ADENOCARCINOMA EXTENDS INTO PERIPANCREATIC SOFT TISSUE. - ASSOCIATED PANCREATIC INTRAEPITHELIAL NEOPLASIA (PANIN). - THE SURGICAL RESECTION MARGINS ARE NEGATIVE FOR INVASIVE ADENOCARCINOMA (ALSO SEE SPECIMEN #3). - BENIGN GALLBLADDER. - THERE IS NO EVIDENCE OF CARCINOMA IN 9 F 9 LYMPH NODES (0/9). - SEE ONCOLOGY TABLE BELOW. 2. Lymph node, biopsy, Portal - THERE IS NO EVIDENCE OF CARCINOMA IN 3 OF 3 LYMPH NODES (0/3). 3. Pancreas, resection, Additional pancreatic margin - INVASIVE ADENOCARCINOMA. - PANCREATIC INTRAEPITHELIAL NEOPLASIA (PANIN). - THE PANCREATIC SURGICAL RESECTION MARGIN APPEARS NEGATIVE FOR INVASIVE ADENOCARCINOMA. - SEE COMMENT.   ASSESSMENT AND PLAN:   Carcinoma of head of pancreas No clinical evidence of disease.  Follow up in 3 months.    Exocrine pancreatic insufficiency Increase dose of creon.    Follow up in 3 months.        Milus Height, MD Surgical Oncology, [facility]  Adella Hare, MD [name] Heinz Knuckles, MD

## 2013-12-30 NOTE — Assessment & Plan Note (Signed)
No clinical evidence of disease.  Follow up in 3 months.

## 2013-12-30 NOTE — Patient Instructions (Signed)
Take simethicone three times daily every day for around 4-5 days.  If no improvement in gas symptoms, pick up higher dose of creon.    Follow up in 3 months.

## 2013-12-30 NOTE — Assessment & Plan Note (Signed)
Increase dose of creon.    Follow up in 3 months.

## 2013-12-31 ENCOUNTER — Ambulatory Visit (INDEPENDENT_AMBULATORY_CARE_PROVIDER_SITE_OTHER): Payer: Medicare Other | Admitting: Nurse Practitioner

## 2013-12-31 ENCOUNTER — Encounter: Payer: Self-pay | Admitting: Nurse Practitioner

## 2013-12-31 VITALS — BP 125/70 | HR 95 | Ht 62.0 in | Wt 145.4 lb

## 2013-12-31 DIAGNOSIS — G609 Hereditary and idiopathic neuropathy, unspecified: Secondary | ICD-10-CM | POA: Diagnosis not present

## 2013-12-31 MED ORDER — GABAPENTIN 300 MG PO CAPS: 300.0000 mg | ORAL_CAPSULE | Freq: Four times a day (QID) | ORAL | Status: DC

## 2013-12-31 NOTE — Progress Notes (Signed)
[facility]  PATIENT: [name] DOB: 1941-01-29   REASON FOR VISIT: follow up for small fiber neuropathy   HISTORY OF PRESENT ILLNESS:[name], 73 year old female returns for followup. She was last seen 07/03/2013. She returns today with her son. She has a long history of lower extremity paresthesias. She is currently on gabapentin 200 mg 3 times daily and 300 mg at night. Her symptoms are fairly well controlled. She denies any pain down her extremities, no loss of bowel or bladder control no significant weakness. She has mild gait difficulty due to foot pain. She received a diagnosis of pancreatic cancer since last seen and had a Whipple procedure in March. She spent some time in rehabilitation and skilled facility and is now back living in her own home. She has lost about 20 pounds since last seen. She is supplementing her diet with boost. She returns for reevaluation.   HISTORY: evaluation of bilateral lower extremity paresthesia  She had past medical history of hypertension, hyperlipidemia, depression, is on polypharmacy treatment, including Prozac 20 mg every day, Ativan 3 times a day, Topamax 200 mg twice a day,  She presented with more than 10 years history of bilateral feet paresthesia, getting worse over the past 2 years, she described bilateral feet numbness tingling, going up to her legs, her feet felt like it was broken sometimes, her feet burns, ice pack helps some, but sometimes she put on socks, wrapping tightly around her socks, which seems to help her symptoms some, her feet pain getting worse after bear weight,  She also has a history of chronic low back pain, had a history of low back decompression surgery by Dr. Lorin Mercy in 2004, she denies shooting pain to her neck, no bowel bladder incontinence, mild gait difficulty due to feet pain, no significant weakness  Most recent laboratory evaluation showed normal vitamin B12, TSH, CMP, elevated WBC 15.7  UPDATE  January 17 2013:  Laboratory evaluations was normal including CBC, RPR, ANA, vitamin B12, TSH, mild elevated C. reactive protein of 11.2,  She has tried compounding cream, which has made her skin peel, she is taking gabapentin 300 mg 3 times a day, which helped her 60%, also make her tired, the most bothersome symptoms at nighttime, she complains of bilateral plantar feet burning pain, she has to wrap her toes together, soak it in hot water, which only helped her temporarily.  She has mild gait difficulty due to bilateral feet pain, she denies bowel and bladder incontinence. She denies significant low back pain,      REVIEW OF SYSTEMS: Full 14 system review of systems performed and notable only for those listed, all others are neg:  Constitutional: Fatigue Cardiovascular: Leg swelling Ear/Nose/Throat: Hearing loss Skin: N/A  Eyes: Itchy eyes  Respiratory: N/A  Gastroitestinal: Nausea Hematology/Lymphatic: N/A  Endocrine: N/A Musculoskeletal: Joint pain, joint swelling, aching muscles , history of arthritis Allergy/Immunology: N/A  Neurological: Memory loss, dizziness, weakness Psychiatric: Anxiety  Sleep : NA   ALLERGIES: Allergies  Allergen Reactions  . Celecoxib Other (See Comments)    unknown  . Glucosamine     unknown  . Metoclopramide Hcl Other (See Comments)    shaking  . Niacin Other (See Comments)    shaking  . 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 Phenazopyridine Hcl Other (See Comments)    unknown  . Sulfonamide Derivatives Nausea And Vomiting  . Trovan [Alatrofloxacin Mesylate] Other (See Comments)  Caused shaking and nervousness  . Benzocaine-Resorcinol Rash  . Sulfa Antibiotics Rash    HOME MEDICATIONS: Outpatient Prescriptions Prior to Visit  Medication Sig Dispense Refill  . acetaminophen (TYLENOL) 325 MG tablet Take 650 mg by mouth every 6 (six) hours as needed for mild pain, moderate pain or fever.      Marland Kitchen  aluminum & magnesium hydroxide-simethicone (MYLANTA) 500-450-40 MG/5ML suspension Take 5 mLs by mouth every 6 (six) hours as needed for indigestion.      Marland Kitchen aspirin 81 MG tablet Take 81 mg by mouth daily.        Marland Kitchen atenolol (TENORMIN) 25 MG tablet Take by mouth daily.      . carboxymethylcellulose (REFRESH TEARS) 0.5 % SOLN Place 1 drop into both eyes 2 (two) times daily.       . cholecalciferol (VITAMIN D) 1000 UNITS tablet Take 1,000 Units by mouth daily.       Marland Kitchen docusate sodium (COLACE) 100 MG capsule Take 200 mg by mouth at bedtime.      . feeding supplement, RESOURCE BREEZE, (RESOURCE BREEZE) LIQD Take 1 Container by mouth daily.  [place] FLUoxetine (PROZAC) 20 MG capsule Take 1 capsule by mouth  daily  30 capsule  11  . gabapentin (NEURONTIN) 100 MG capsule Take 200 mg by mouth 4 (four) times daily. + 100 mg at night      . hydrOXYzine (VISTARIL) 25 MG capsule Take 25 mg by mouth at bedtime.      Marland Kitchen levothyroxine (SYNTHROID, LEVOTHROID) 175 MCG tablet Take 1 tablet by mouth   every day  30 tablet  5  . lipase/protease/amylase (CREON) 12000 UNITS CPEP capsule Take 2 capsules (24,000 Units total) by mouth 3 (three) times daily before meals.  270 capsule  0  . loratadine (CLARITIN) 10 MG tablet Take 10 mg by mouth daily.      Marland Kitchen LORazepam (ATIVAN) 0.5 MG tablet Take 0.5 mg by mouth 3 (three) times daily.       Marland Kitchen lovastatin (MEVACOR) 40 MG tablet Take 1 tablet by mouth at   bedtime  90 tablet  3  . meclizine (ANTIVERT) 25 MG tablet Take 25 mg by mouth every 6 (six) hours as needed for dizziness.       . moxifloxacin (VIGAMOX) 0.5 % ophthalmic solution Place 1 drop into the left eye 3 (three) times daily.       . Multiple Vitamin (MULTIVITAMIN WITH MINERALS) TABS tablet Take 1 tablet by mouth daily.      Marland Kitchen omeprazole (PRILOSEC OTC) 20 MG tablet Take 20 mg by mouth 2 (two) times daily.       . ondansetron (ZOFRAN) 4 MG tablet Take 1 tablet (4 mg total) by mouth every 4 (four) hours as  needed for nausea.  20 tablet  0  . polycarbophil (FIBERCON) 625 MG tablet Take 1,250 mg by mouth at bedtime.      . promethazine (PHENERGAN) 12.5 MG tablet Take 1 tablet (12.5 mg total) by mouth 4 (four) times daily -  before meals and at bedtime.  30 tablet  0  . topiramate (TOPAMAX) 200 MG tablet Take 1 tablet by mouth   twice a day  60 tablet  5  . UNABLE TO FIND Apply 1 application topically 2 (two) times daily. Doxep/Amanta/DXM/Lidocaine 25 mg for her feet      . vitamin E 400 UNIT capsule Take 800 Units by mouth 2 (two) times daily. For tartive  dyskinesia      . feeding supplement, ENSURE COMPLETE, (ENSURE COMPLETE) LIQD Take 237 mLs by mouth daily.  60 Bottle  2   No facility-administered medications prior to visit.    PAST MEDICAL HISTORY: Past Medical History  Diagnosis Date  . Osteoarthritis   . Hiatal hernia   . Diarrhea   . Back pain   . Chronic maxillary sinusitis   . Depression   . Eustachian tube dysfunction   . Tardive dyskinesia   . Mitral valve prolapse   . Osteoarthritis   . GERD (gastroesophageal reflux disease)   . Allergic rhinitis   . Glaucoma   . Hypothyroid   . Hypertension   . Heart murmur     hx. "MVP" -predental antibiotics  . Memory loss   . Urgency of urination   . Stroke     mini storkes left leg paraylsis  . Benign neoplasm of colon   . Cancer 07/10/13    Pancreatic cancer with MRI scan 06-19-13    PAST SURGICAL HISTORY: Past Surgical History  Procedure Laterality Date  . Knee surgery Left     x 5  . 1 baker cyst removed    . Dilation and curettage of uterus      x3  . Lumbar spine surgery      x2  . Abdominal hysterectomy    . Lumbar spine surgery      cyst  . Joint replacement      LTKA  . Blepharoplasty Bilateral     with cataract surgery  . Eye surgery Right     cataract  . Eus N/A 07/10/2013    Procedure: ESOPHAGEAL ENDOSCOPIC ULTRASOUND (EUS) RADIAL;  Surgeon: Arta Silence, MD;  Location: [facility];  Service:  Endoscopy;  Laterality: N/A;  . Fine needle aspiration N/A 07/10/2013    Procedure: FINE NEEDLE ASPIRATION (FNA) LINEAR;  Surgeon: Arta Silence, MD;  Location: [facility];  Service: Endoscopy;  Laterality: N/A;  possible fna  . Back surgery      fusion  . Breast surgery      Biopsy left 2 times  . Colonoscopy w/ polypectomy    . Laparoscopy N/A 08/07/2013    Procedure: LAPAROSCOPY DIAGNOSTIC;  Surgeon: [name], MD;  Location: [place];  Service: General;  Laterality: N/A;  . Whipple procedure N/A 08/07/2013    Procedure: WHIPPLE PROCEDURE;  Surgeon: [name], MD;  Location: [place];  Service: General;  Laterality: N/A;  . Esophagogastroduodenoscopy N/A 09/11/2013    Procedure: ESOPHAGOGASTRODUODENOSCOPY (EGD);  Surgeon: Cleotis Nipper, MD;  Location: [facility] ENDOSCOPY;  Service: Endoscopy;  Laterality: N/A;  Moderate sedation okay if MAC not available    FAMILY HISTORY: Family History  Problem Relation Age of Onset  . Cancer Mother     Breast Cancer with Metastatic disease  . Alzheimer's disease Father   . Cancer Maternal Uncle     prostate    SOCIAL HISTORY: History   Social History  . Marital Status: Widowed    Spouse Name: N/A    Number of Children: 2  . Years of Education: 8   Occupational History  .    Marland Kitchen Retired    Social History Main Topics  . Smoking status: Never Smoker   . Smokeless tobacco: Never Used  . Alcohol Use: No  . Drug Use: No  . Sexual Activity: Not Currently   Other Topics Concern  . 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 Generalized: Well developed,  in no acute distress  Head: normocephalic and atraumatic,. Oropharynx benign  Neck: Supple, no carotid bruits ,  Neurological examination  Mentation: Alert oriented to time, place, history taking. Follows all commands speech and language fluent.  Cranial nerve II-XII: .Pupils were equal round reactive to light extraocular movements were full, visual field were full on confrontational test. Facial sensation and strength were normal. hearing was intact to finger rubbing bilaterally. Uvula tongue midline. head turning and shoulder shrug were normal and symmetric.Tongue protrusion into cheek strength was normal.  Motor: normal bulk and tone, full strength in the BUE, BLE, No focal weakness  Sensory: Length dependent decreased fine touch, pinprick to mid shin level bilaterally, decreased vibratory to toes, preserved proprioception  Coordination: finger-nose-finger, heel-to-shin bilaterally, no dysmetria  Reflexes: Brachioradialis 2/2, biceps 2/2, triceps 2/2, patellar 2/2, Achilles trace, plantar responses were flexor bilaterally.  Gait and Station: Rising up from seated position without assistance, normal stance, moderate stride, good arm swing, smooth turning, able to perform tiptoe, and heel walking without difficulty. Tandem gait is mildly unsteady. no assistive device     DIAGNOSTIC DATA (LABS, IMAGING, TESTING) - I reviewed patient records, labs, notes, testing and imaging myself where available.  Lab Results  Component Value Date   WBC 7.4 11/27/2013   HGB 13.3 11/27/2013   HCT 38.8 11/27/2013   MCV 93.2 11/27/2013   PLT 193.0 11/27/2013      Component Value Date/Time   NA 141 11/27/2013 1504   K 3.8 11/27/2013 1504   CL 106 11/27/2013 1504   CO2 27 11/27/2013 1504   GLUCOSE 113* 11/27/2013 1504   BUN 11 11/27/2013 1504   CREATININE 0.8 11/27/2013 1504   CREATININE 0.81 07/01/2013 1324   CALCIUM 9.2 11/27/2013 1504   PROT 6.8  11/27/2013 1504   ALBUMIN 3.8 11/27/2013 1504   AST 38* 11/27/2013 1504   ALT 37* 11/27/2013 1504   ALKPHOS 115 11/27/2013 1504   BILITOT 0.6 11/27/2013 1504   GFRNONAA 57* 09/09/2013 0502   GFRAA 66* 09/09/2013 0502    Lab Results  Component Value Date   HGBA1C 5.0 11/28/2013   Lab Results  Component Value Date   VITAMINB12 >1500* 10/29/2013   Lab Results  Component Value Date   TSH 1.50 11/27/2013      ASSESSMENT AND PLAN  73 y.o. year old female  has a past medical history of bilateral foot pain paresthesias for greater than 10 years . 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 Recent procedure for pancreatic cancer. Reviewed  hospital record   Increase Gabapentin to 300mg  or 3 caps four times daily, will refill with 300mg  cap Please use current bottle of meds,( 100mg ) until gone Continue Folic acid in multi vitamin Try B complex vitamins if not already using F/U in 6 months , next visit with Dr. Doloris Hall  Cecille Rubin, [facility], [facility], APRN  [facility] [address], [place], [place] [address] [phone]

## 2013-12-31 NOTE — Patient Instructions (Addendum)
Increase Gabapentin to 300mg  or 3 caps four times daily, will refill Please use current bottle of meds Continue Folic acid in multi vitamin Try B complex vitamins if not already using F/U in 6 months

## 2014-01-03 ENCOUNTER — Telehealth: Payer: Self-pay | Admitting: Nurse Practitioner

## 2014-01-03 NOTE — Telephone Encounter (Signed)
Spoke to patient and relayed to give the increase in medicine at least a week and then call back if not better, per [place].  Patient verbalized understanding.

## 2014-01-03 NOTE — Telephone Encounter (Signed)
Spoke to patient and she relayed that she increase the Gabapentin as directed in her 12-31-13 visit and she doesn't notice any change in the burning and stinging, says it's about the same.  She is still getting up in the middle of the night to soak feet in hot water because of the pain.  Asking for further recommendations.

## 2014-01-03 NOTE — Telephone Encounter (Signed)
It hasnt even been a full 3 days, I would give it at least a week and call back if no better

## 2014-01-03 NOTE — Telephone Encounter (Signed)
Patient calling to state that she sees no improvement with her feet despite the medication she was prescribed, states that she ist still having burning and stinging and it kept her up all night. Please return call to patient and advise.

## 2014-01-08 ENCOUNTER — Ambulatory Visit (INDEPENDENT_AMBULATORY_CARE_PROVIDER_SITE_OTHER): Payer: Medicare Other | Admitting: Family Medicine

## 2014-01-08 ENCOUNTER — Encounter: Payer: Self-pay | Admitting: Family Medicine

## 2014-01-08 VITALS — BP 120/76 | HR 68 | Temp 98.4°F | Ht 62.0 in | Wt 147.0 lb

## 2014-01-08 DIAGNOSIS — R413 Other amnesia: Secondary | ICD-10-CM | POA: Insufficient documentation

## 2014-01-08 DIAGNOSIS — R7309 Other abnormal glucose: Secondary | ICD-10-CM

## 2014-01-08 DIAGNOSIS — F411 Generalized anxiety disorder: Secondary | ICD-10-CM

## 2014-01-08 DIAGNOSIS — G609 Hereditary and idiopathic neuropathy, unspecified: Secondary | ICD-10-CM

## 2014-01-08 DIAGNOSIS — C250 Malignant neoplasm of head of pancreas: Secondary | ICD-10-CM | POA: Diagnosis not present

## 2014-01-08 MED ORDER — LORAZEPAM 0.5 MG PO TABS: 0.5000 mg | ORAL_TABLET | Freq: Three times a day (TID) | ORAL | Status: DC

## 2014-01-08 NOTE — Progress Notes (Signed)
[name] Reddish, MD Phone: [phone]  Subjective:  Patient presents today to establish care. Chief complaint-noted.   Anxiety Patient takes Prozac and Topamax. She used to be followed by psychiatry who prescribed Ativan. She reports that Dr. Linda [name] took over this prescription. She is okay with seeking care through psychiatry again to receive benzodiazepines. ROS-no suicidal or homicidal ideation  Pancreatic cancer status post Whipple procedure with risk for diabetes Patient followed by Dr. [name] of oncology at [facility] long as well as Dr. [name] her general surgeon. She had an A1c one month of 5. ROS-no polyuria or polydipsia  Neuropathy Patient has a cane and walker at home but rarely uses them. She has recently been seen by neurology and gabapentin was titrated up. She does admit to some fatigue with this but is encouraged by some improvement in her pain.  The following were reviewed and entered/updated in epic: Past Medical History  Diagnosis Date  . Osteoarthritis   . Hiatal hernia   . Chronic maxillary sinusitis     neti pot  . Depression     alone a lot  . Eustachian tube dysfunction   . Tardive dyskinesia     possibly reglan, vitamin E helps  . Mitral valve prolapse     antibiotics before dental procedures  . GERD (gastroesophageal reflux disease)   . Allergic rhinitis   . Glaucoma   . Hypothyroid   . Hypertension   . Heart murmur     hx. "MVP" -predental antibiotics  . Memory loss     short term memory loss  . Urgency of urination     some UTI in past  . Stroke     mini storkes left leg paraylsis. patient denies weakness 01/08/14.   . Cancer 07/10/13    Pancreatic cancer with MRI scan 06-19-13   Patient Active Problem List   Diagnosis Date Noted  . Exocrine pancreatic insufficiency 12/30/2013    Priority: High  . Carcinoma of head of pancreas 07/01/2013    Priority: High  . MITRAL VALVE PROLAPSE 02/26/2007    Priority: High  . Memory loss 01/08/2014    Priority: Medium  . Other abnormal glucose 11/29/2013    Priority: Medium  . 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 Paresthesia of foot 06/06/2012    Priority: Medium  . Syncope 09/26/2011    Priority: Medium  . HYPOTHYROIDISM 02/26/2007    Priority: Medium  . HYPERLIPIDEMIA 02/26/2007    Priority: Medium  . ANXIETY 02/26/2007    Priority: Medium  . DEPRESSION 02/26/2007    Priority: Medium  . HYPERTENSION 02/26/2007    Priority: Medium  . RHINOSINUSITIS, CHRONIC 02/03/2009    Priority: Low  . COLONIC POLYPS 06/05/2008    Priority: Low  . OSTEOARTHRITIS 06/04/2008    Priority: Low  . DIARRHEA, CHRONIC 04/22/2008    Priority: Low  . BACK PAIN 08/03/2007    Priority: Low  . Chronic maxillary sinusitis 04/20/2007    Priority: Low  . TARDIVE DYSKINESIA 02/26/2007    Priority: Low  . GLAUCOMA 02/26/2007    Priority: Low  . EUSTACHIAN TUBE DYSFUNCTION 02/26/2007    Priority: Low  . ALLERGIC RHINITIS 02/26/2007    Priority: Low  . GERD 02/26/2007    Priority: Low  . HIATAL HERNIA 02/26/2007    Priority: Low  . OSTEOPOROSIS 02/26/2007    Priority: Low   Past Surgical History  Procedure Laterality Date  . Knee surgery Left  x 5, total knee Left knee  . 1 baker cyst removed    . Dilation and curettage of uterus      x3  . Lumbar spine surgery      x2  . Abdominal hysterectomy      including ovaries  . Lumbar spine surgery      cyst  . Joint replacement      LTKA  . Blepharoplasty Bilateral     with cataract surgery  . Eye surgery Right     cataract  . Eus N/A 07/10/2013    Procedure: ESOPHAGEAL ENDOSCOPIC ULTRASOUND (EUS) RADIAL;  Surgeon: Arta Silence, MD;  Location: [facility];  Service: Endoscopy;  Laterality: N/A;  . Fine needle aspiration N/A 07/10/2013    Procedure: FINE NEEDLE ASPIRATION (FNA) LINEAR;  Surgeon: Arta Silence, MD;  Location: [facility];  Service: Endoscopy;  Laterality: N/A;  possible fna    . Back surgery      fusion  . Breast surgery      Biopsy left 2 times  . Colonoscopy w/ polypectomy      2004 last colonoscopy, no polyps  . Laparoscopy N/A 08/07/2013    Procedure: LAPAROSCOPY DIAGNOSTIC;  Surgeon: [name], MD;  Location: [place];  Service: General;  Laterality: N/A;  . Whipple procedure N/A 08/07/2013    Procedure: WHIPPLE PROCEDURE;  Surgeon: [name], MD;  Location: [place];  Service: General;  Laterality: N/A;  . Esophagogastroduodenoscopy N/A 09/11/2013    Procedure: ESOPHAGOGASTRODUODENOSCOPY (EGD);  Surgeon: Cleotis Nipper, MD;  Location: [facility];  Service: Endoscopy;  Laterality: N/A;  Moderate sedation okay if MAC not available    Family History  Problem Relation Age of Onset  . Cancer Mother     Breast Cancer with Metastatic disease  . Alzheimer's disease Father   . Cancer Maternal Uncle     prostate    Medications- reviewed and updated Current Outpatient Prescriptions  Medication Sig Dispense Refill  . acetaminophen (TYLENOL) 325 MG tablet Take 650 mg by mouth every 6 (six) hours as needed for mild pain, moderate pain or fever.      Marland Kitchen aluminum & magnesium hydroxide-simethicone (MYLANTA) 500-450-40 MG/5ML suspension Take 5 mLs by mouth every 6 (six) hours as needed for indigestion.      Marland Kitchen aspirin 81 MG tablet Take 81 mg by mouth daily.        Marland Kitchen atenolol (TENORMIN) 25 MG tablet Take by mouth daily.      . carboxymethylcellulose (REFRESH TEARS) 0.5 % SOLN Place 1 drop into both eyes 2 (two) times daily.       . cholecalciferol (VITAMIN D) 1000 UNITS tablet Take 1,000 Units by mouth daily.       Marland Kitchen docusate sodium (COLACE) 100 MG capsule Take 200 mg by mouth at bedtime.      . feeding supplement, RESOURCE BREEZE, (RESOURCE BREEZE) LIQD Take 1 Container by mouth daily.  Phillips FLUoxetine (PROZAC) 20 MG capsule Take 1 capsule by mouth  daily  30 capsule  11  . gabapentin (NEURONTIN) 300 MG capsule Take 1 capsule (300 mg total) by mouth 4  (four) times daily.  120 capsule  6  . hydrOXYzine (VISTARIL) 25 MG capsule Take 25 mg by mouth at bedtime.      Marland Kitchen levothyroxine (SYNTHROID, LEVOTHROID) 175 MCG tablet Take 1 tablet by mouth   every day  30 tablet  5  . lipase/protease/amylase (CREON) 12000 UNITS CPEP capsule Take  2 capsules (24,000 Units total) by mouth 3 (three) times daily before meals.  270 capsule  0  . loratadine (CLARITIN) 10 MG tablet Take 10 mg by mouth daily.      Marland Kitchen LORazepam (ATIVAN) 0.5 MG tablet Take 1 tablet (0.5 mg total) by mouth 3 (three) times daily.  30 tablet  2  . lovastatin (MEVACOR) 40 MG tablet Take 1 tablet by mouth at   bedtime  90 tablet  3  . meclizine (ANTIVERT) 25 MG tablet Take 25 mg by mouth every 6 (six) hours as needed for dizziness.       . moxifloxacin (VIGAMOX) 0.5 % ophthalmic solution Place 1 drop into the left eye 3 (three) times daily.       . Multiple Vitamin (MULTIVITAMIN WITH MINERALS) TABS tablet Take 1 tablet by mouth daily.      Marland Kitchen omeprazole (PRILOSEC OTC) 20 MG tablet Take 20 mg by mouth 2 (two) times daily.       . ondansetron (ZOFRAN) 4 MG tablet Take 1 tablet (4 mg total) by mouth every 4 (four) hours as needed for nausea.  20 tablet  0  . polycarbophil (FIBERCON) 625 MG tablet Take 1,250 mg by mouth at bedtime.      . promethazine (PHENERGAN) 12.5 MG tablet Take 1 tablet (12.5 mg total) by mouth 4 (four) times daily -  before meals and at bedtime.  30 tablet  0  . topiramate (TOPAMAX) 200 MG tablet Take 1 tablet by mouth   twice a day  60 tablet  5  . UNABLE TO FIND Apply 1 application topically 2 (two) times daily. Doxep/Amanta/DXM/Lidocaine 25 mg for her feet      . vitamin E 400 UNIT capsule Take 800 Units by mouth 2 (two) times daily. For tartive dyskinesia       No current facility-administered medications for this visit.    Allergies-reviewed and updated Allergies  Allergen Reactions  . Celecoxib Other (See Comments)    unknown  . Glucosamine     unknown  .  Metoclopramide Hcl Other (See Comments)    shaking  . Niacin Other (See Comments)    shaking  . 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 Phenazopyridine Hcl Other (See Comments)    unknown  . Sulfonamide Derivatives Nausea And Vomiting  . Trovan [Alatrofloxacin Mesylate] Other (See Comments)    Caused shaking and nervousness  . Benzocaine-Resorcinol Rash  . Sulfa Antibiotics Rash    History   Social History  . Marital Status: Widowed    Spouse Name: N/A    Number of Children: 2  . Years of Education: 8   Occupational History  .    Marland Kitchen Retired    Social History Main Topics  . Smoking status: Never Smoker   . Smokeless tobacco: Never Used  . Alcohol Use: No  . Drug Use: No  . Sexual Activity: Not Currently   Other Topics Concern  . 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 Oropharynx normal Neck: no thyromegaly CV: RRR no murmurs rubs or gallops Lungs: CTAB no crackles, wheeze, rhonchi Abdomen: soft/nontender/nondistended/normal bowel sounds. No rebound or guarding.  Ext: Trace edema Skin: warm, dry, no rash Neuro: grossly normal, moves all extremities, PERRLA  Assessment/Plan:  ANXIETY Refill Ativan at this time to allow patient to transition care back to psychiatry for chronic benzodiazepines. I  discussed I do not prescribe chronic benzodiazepines for anxiety  Other abnormal glucose History of pancreatic cancer s/p whipple procedure.  We'll repeat A1c at next visit. Due to risk after Whipple procedure.  Idiopathic Neuropathy followed by neurology Discussed due to neuropathy I would prefer that patient use a cane or walker regularly. Her son is to reinforce this at home  Discussed colonoscopy but given recent Whipple procedure patient preferred to not be referred for this at this time. Given almost 74 we also discussed considering discontinuing future colonoscopies. Patient would like to readdress one year. Meds ordered this encounter  Medications  . LORazepam (ATIVAN) 0.5 MG tablet    Sig: Take 1 tablet (0.5 mg total) by mouth 3 (three) times daily.    Dispense:  30 tablet    Refill:  2

## 2014-01-08 NOTE — Patient Instructions (Addendum)
Wonderful to get to meet you and get to know you a little better. I look forward to working with you more.  I refilled your ativan but want you to reestablish with psychiatry for long term prescribing of this medication.  Let's see each other back in a six months to a year or sooner if needed.  We will discuss repeat colonoscopy next year.   Health Maintenance Due  Topic Date Due  . Tetanus/tdap  08/29/1958  . Zostavax  08/29/1999  . Colonoscopy  04/02/2013  . Influenza Vaccine  12/28/2013

## 2014-01-08 NOTE — Assessment & Plan Note (Signed)
Discussed due to neuropathy I would prefer that patient use a cane or walker regularly. Her son is to reinforce this at home

## 2014-01-08 NOTE — Assessment & Plan Note (Signed)
Refill Ativan at this time to allow patient to transition care back to psychiatry for chronic benzodiazepines. I discussed I do not prescribe chronic benzodiazepines for anxiety

## 2014-01-08 NOTE — Assessment & Plan Note (Signed)
We'll repeat A1c at next visit. Due to risk after Whipple procedure.

## 2014-01-13 ENCOUNTER — Telehealth: Payer: Self-pay | Admitting: Family Medicine

## 2014-01-13 ENCOUNTER — Telehealth: Payer: Self-pay

## 2014-01-13 MED ORDER — LORAZEPAM 0.5 MG PO TABS: 0.5000 mg | ORAL_TABLET | Freq: Three times a day (TID) | ORAL | Status: DC

## 2014-01-13 NOTE — Telephone Encounter (Signed)
Medication resent

## 2014-01-13 NOTE — Telephone Encounter (Signed)
Pt was seen on 8/12 and went to pharm [facility] [address] yesterday and lorazepam was not there. Please call rx into pharm

## 2014-01-13 NOTE — Telephone Encounter (Signed)
Error

## 2014-01-14 ENCOUNTER — Telehealth: Payer: Self-pay | Admitting: *Deleted

## 2014-01-14 NOTE — Telephone Encounter (Signed)
Error//AB/CMA 

## 2014-01-16 ENCOUNTER — Telehealth: Payer: Self-pay | Admitting: Nurse Practitioner

## 2014-01-16 NOTE — Telephone Encounter (Signed)
Patient feet are still hurting and bunring after using prescribed medication.  Please call and advise anytime.

## 2014-01-17 NOTE — Telephone Encounter (Signed)
Spoke to patient and she is aware that Cm is out of the office. She stated that she will continue taking the meds until she hears something from the office next week. Please advise

## 2014-01-20 ENCOUNTER — Telehealth: Payer: Self-pay | Admitting: Family Medicine

## 2014-01-20 ENCOUNTER — Telehealth: Payer: Self-pay | Admitting: Neurology

## 2014-01-20 MED ORDER — GABAPENTIN 400 MG PO CAPS: ORAL_CAPSULE | ORAL | Status: DC

## 2014-01-20 NOTE — Telephone Encounter (Signed)
Spoke to patient in regards to Gabapentin increase, per [name] and she proceeded to ask about the compound cream the doctor prescribed.  She stated that it is very expensive for a small jar and wanted me to ask the doctor if there is something else she can give.  Stated it works for a little while and then pain comes back.

## 2014-01-20 NOTE — Telephone Encounter (Signed)
Pt states she has no more refills on her rx hydrOXYzine (VISTARIL) 25 MG capsule [facility]. Pt states she is not sure if dr. Yong Channel wants her to continue the meds .

## 2014-01-20 NOTE — Telephone Encounter (Signed)
Pt states she is on so much medication that she would see how she does with out taking it for now and if need be will discuss other options with you.

## 2014-01-20 NOTE — Telephone Encounter (Signed)
Dr. Yong Channel would you like pt to continnue this medication?

## 2014-01-20 NOTE — Telephone Encounter (Signed)
Appears she is taking this for sleep. Not ideal for sleep given age. If she would prefer not to take this, that would be most ideal. If she needs it for sleep, can refill at this time with 3 refills.

## 2014-01-20 NOTE — Telephone Encounter (Signed)
Dr. Yong Channel would you like pt to continue this medication?

## 2014-01-20 NOTE — Telephone Encounter (Signed)
Please ask patient to increase Gabapentin to 400mg  capsule four times daily. Medication has been called in . Please hold on to the 300mg  dose you are currently taking . We may use it later.

## 2014-01-20 NOTE — Telephone Encounter (Signed)
Spoke to patient and relayed that she increase her Gabapentin to 400mg  capsules four times daily.  The patient was questioning that amount because of a comment made previously that over 300mg  capsules four times daily would be too much.  She just wants to be sure the increase dose is not too much.

## 2014-01-21 NOTE — Telephone Encounter (Signed)
TC to patient. Made her aware that what I said on her previous visit was that I would increase the Gabapentin by 100mg  increments in an attempt to avoid side effects to the medication, so on her previous visit it was increased to 300mg  QID now I will increase to 400mg  QID. She verbalizes understanding.

## 2014-01-22 ENCOUNTER — Telehealth: Payer: Self-pay | Admitting: Family Medicine

## 2014-01-22 MED ORDER — LORAZEPAM 0.5 MG PO TABS: 0.5000 mg | ORAL_TABLET | Freq: Three times a day (TID) | ORAL | Status: DC

## 2014-01-22 NOTE — Telephone Encounter (Signed)
I have called and reviewed chart, left a message for her  She had past medical history of hypertension, hyperlipidemia, depression, is on polypharmacy treatment, including Prozac 20 mg every day, Ativan 3 times a day, Topamax 200 mg twice a day,  She presented with more than 10 years history of bilateral feet paresthesia, getting worse over the past 2 years, she described bilateral feet numbness tingling, going up to her legs   Her problem is chronic, I prefer to discuss medication  choice during her next followup

## 2014-01-22 NOTE — Telephone Encounter (Signed)
Yes, may change.

## 2014-01-22 NOTE — Telephone Encounter (Signed)
Is this ok to change her to a 30 day supply?

## 2014-01-22 NOTE — Telephone Encounter (Signed)
Pt states on last visit dr. Yong Channel informed her to get an appointment with a phychiatric dr. for nerves. Pt has made an apptointment however it will not be until 03/19/14 with dr. Alyse Low. Pt states dr. Yong Channel only written her two refills of 10day supply.pt requesting the rx be changed to 30 day supply until she can get in to the see the dr. Grayce Sessions LORazepam (ATIVAN) 0.5 MG tablet [facility] [address]

## 2014-01-22 NOTE — Telephone Encounter (Signed)
Medication sent in. 

## 2014-01-27 ENCOUNTER — Telehealth: Payer: Self-pay | Admitting: Neurology

## 2014-01-27 MED ORDER — ZOLPIDEM TARTRATE 10 MG PO TABS: 10.0000 mg | ORAL_TABLET | Freq: Every evening | ORAL | Status: DC | PRN

## 2014-01-27 NOTE — Telephone Encounter (Signed)
Discussed with Dr. Janann Colonel, [place] physician, it has not been a week since her Gabapentin was increased, he suggests something to help her sleep such as ambien. She also has a call in to GI for stomach pain and bloating that started over the weekend. Made her aware I will call in [place].

## 2014-01-27 NOTE — Telephone Encounter (Signed)
Patient calling to state that her feet are still hurting, stinging, and burning despite her meds being increased to 400 mg. Patient states she is unable to get any sleep because her feet hurt so bad, and she had to call an [place] who advised her to take oxycodone, which patient did and states that it was the only thing that helped her. Please call and advise.

## 2014-01-27 NOTE — Telephone Encounter (Signed)
Called patient and spoke with her.  Oxycodone 5-325 mg one tablet  by mouth every four hours as needed . Patient took one tablet last night at 7:oo pm and she has not taking any more. [facility]. Prescribed Oxycodone. I asked patient if she new nurses name from [place] . Patient did not know she just called the Nurse Line on the back of her insurance card.  I asked patient her pain level she stated her feet were at 8 level as far as a pain.

## 2014-01-29 ENCOUNTER — Ambulatory Visit (INDEPENDENT_AMBULATORY_CARE_PROVIDER_SITE_OTHER): Payer: Medicare Other | Admitting: Family Medicine

## 2014-01-29 ENCOUNTER — Encounter: Payer: Self-pay | Admitting: Family Medicine

## 2014-01-29 VITALS — BP 118/72 | HR 88 | Temp 98.5°F | Wt 146.0 lb

## 2014-01-29 DIAGNOSIS — N3000 Acute cystitis without hematuria: Secondary | ICD-10-CM | POA: Diagnosis not present

## 2014-01-29 DIAGNOSIS — R109 Unspecified abdominal pain: Secondary | ICD-10-CM | POA: Diagnosis not present

## 2014-01-29 DIAGNOSIS — R300 Dysuria: Secondary | ICD-10-CM

## 2014-01-29 DIAGNOSIS — Z8509 Personal history of malignant neoplasm of other digestive organs: Secondary | ICD-10-CM

## 2014-01-29 DIAGNOSIS — N3001 Acute cystitis with hematuria: Secondary | ICD-10-CM

## 2014-01-29 DIAGNOSIS — Z8507 Personal history of malignant neoplasm of pancreas: Secondary | ICD-10-CM

## 2014-01-29 DIAGNOSIS — K8689 Other specified diseases of pancreas: Secondary | ICD-10-CM

## 2014-01-29 LAB — CBC WITH DIFFERENTIAL/PLATELET
BASOS ABS: 0 10*3/uL (ref 0.0–0.1)
Basophils Relative: 0.4 % (ref 0.0–3.0)
EOS ABS: 0.2 10*3/uL (ref 0.0–0.7)
Eosinophils Relative: 2.6 % (ref 0.0–5.0)
HCT: 39.4 % (ref 36.0–46.0)
Hemoglobin: 13.4 g/dL (ref 12.0–15.0)
LYMPHS PCT: 19.1 % (ref 12.0–46.0)
Lymphs Abs: 1.7 10*3/uL (ref 0.7–4.0)
MCHC: 34.1 g/dL (ref 30.0–36.0)
MCV: 94.4 fl (ref 78.0–100.0)
MONOS PCT: 7.6 % (ref 3.0–12.0)
Monocytes Absolute: 0.7 10*3/uL (ref 0.1–1.0)
Neutro Abs: 6.3 10*3/uL (ref 1.4–7.7)
Neutrophils Relative %: 70.3 % (ref 43.0–77.0)
Platelets: 156 10*3/uL (ref 150.0–400.0)
RBC: 4.17 Mil/uL (ref 3.87–5.11)
RDW: 14.4 % (ref 11.5–15.5)
WBC: 9 10*3/uL (ref 4.0–10.5)

## 2014-01-29 LAB — COMPREHENSIVE METABOLIC PANEL
ALBUMIN: 3.6 g/dL (ref 3.5–5.2)
ALT: 42 U/L — ABNORMAL HIGH (ref 0–35)
AST: 37 U/L (ref 0–37)
Alkaline Phosphatase: 122 U/L — ABNORMAL HIGH (ref 39–117)
BUN: 13 mg/dL (ref 6–23)
CO2: 23 mEq/L (ref 19–32)
CREATININE: 0.9 mg/dL (ref 0.4–1.2)
Calcium: 9 mg/dL (ref 8.4–10.5)
Chloride: 108 mEq/L (ref 96–112)
GFR: 65.82 mL/min (ref >60.00)
GLUCOSE: 64 mg/dL — AB (ref 70–99)
POTASSIUM: 4.2 meq/L (ref 3.5–5.1)
Sodium: 141 mEq/L (ref 135–145)
Total Bilirubin: 0.7 mg/dL (ref 0.2–1.2)
Total Protein: 6.4 g/dL (ref 6.0–8.3)

## 2014-01-29 LAB — POCT URINALYSIS DIPSTICK
Bilirubin, UA: NEGATIVE
GLUCOSE UA: NEGATIVE
Ketones, UA: NEGATIVE
Nitrite, UA: NEGATIVE
PH UA: 7
SPEC GRAV UA: 1.01
UROBILINOGEN UA: 0.2

## 2014-01-29 LAB — LIPASE: Lipase: 9 U/L — ABNORMAL LOW (ref 11.0–59.0)

## 2014-01-29 MED ORDER — CEPHALEXIN 500 MG PO CAPS: 500.0000 mg | ORAL_CAPSULE | Freq: Four times a day (QID) | ORAL | Status: DC

## 2014-01-29 NOTE — Patient Instructions (Signed)
Urine and burning with peeing suggest UTI  Treat with Keflex for 7 days  Urine culture  With worsening abdominal pain  Check labs today  If pain worsens over next day, seek care    If symptoms but not stable by Friday, i agree seeing your surgeon would be a good idea. We could also see you if needed and get you set up for CT scan

## 2014-01-29 NOTE — Progress Notes (Signed)
[name], MD Phone: [phone]  Subjective:   [name] is a 73 y.o. year old very pleasant female patient who presents with the following:  Dysuria Abdominal pain Dysuria 3-4 days. No treatments tried. During this time she has also developed worsening abdominal discomfort (history of whipple procedure). One month ago patient did admit to mild abdominal discomfort but she recently been told by her surgeon this was likely related to recovery from recent surgery. Patient tells me that they called surgery and were told they may need a CT scan if still having abdominal pain on Friday. Patient admits to mild nausea but no vomiting.  ROS-no polyuria or polydipsia.No changes in bowel habits. no constipation  Past Medical History- Patient Active Problem List   Diagnosis Date Noted  . Exocrine pancreatic insufficiency 12/30/2013    Priority: High  . Carcinoma of head of pancreas 07/01/2013    Priority: High  . MITRAL VALVE PROLAPSE 02/26/2007    Priority: High  . Memory loss 01/08/2014    Priority: Medium  . Other abnormal glucose 11/29/2013    Priority: Medium  . 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 Paresthesia of foot 06/06/2012    Priority: Medium  . Syncope 09/26/2011    Priority: Medium  . HYPOTHYROIDISM 02/26/2007    Priority: Medium  . HYPERLIPIDEMIA 02/26/2007    Priority: Medium  . ANXIETY 02/26/2007    Priority: Medium  . DEPRESSION 02/26/2007    Priority: Medium  . HYPERTENSION 02/26/2007    Priority: Medium  . RHINOSINUSITIS, CHRONIC 02/03/2009    Priority: Low  . COLONIC POLYPS 06/05/2008    Priority: Low  . OSTEOARTHRITIS 06/04/2008    Priority: Low  . DIARRHEA, CHRONIC 04/22/2008    Priority: Low  . BACK PAIN 08/03/2007    Priority: Low  . Chronic maxillary sinusitis 04/20/2007    Priority: Low  . TARDIVE DYSKINESIA 02/26/2007    Priority: Low  . GLAUCOMA 02/26/2007    Priority: Low  . EUSTACHIAN  TUBE DYSFUNCTION 02/26/2007    Priority: Low  . ALLERGIC RHINITIS 02/26/2007    Priority: Low  . GERD 02/26/2007    Priority: Low  . HIATAL HERNIA 02/26/2007    Priority: Low  . OSTEOPOROSIS 02/26/2007    Priority: Low   Medications- reviewed and updated Current Outpatient Prescriptions  Medication Sig Dispense Refill  . acetaminophen (TYLENOL) 325 MG tablet Take 650 mg by mouth every 6 (six) hours as needed for mild pain, moderate pain or fever.      Marland Kitchen aluminum & magnesium hydroxide-simethicone (MYLANTA) 500-450-40 MG/5ML suspension Take 5 mLs by mouth every 6 (six) hours as needed for indigestion.      Marland Kitchen aspirin 81 MG tablet Take 81 mg by mouth daily.        Marland Kitchen atenolol (TENORMIN) 25 MG tablet Take by mouth daily.      . carboxymethylcellulose (REFRESH TEARS) 0.5 % SOLN Place 1 drop into both eyes 2 (two) times daily.       . cholecalciferol (VITAMIN D) 1000 UNITS tablet Take 1,000 Units by mouth daily.       Marland Kitchen docusate sodium (COLACE) 100 MG capsule Take 200 mg by mouth at bedtime.      . feeding supplement, RESOURCE BREEZE, (RESOURCE BREEZE) LIQD Take 1 Container by mouth daily.  Henry FLUoxetine (PROZAC) 20 MG capsule Take 1 capsule by mouth  daily  30 capsule  11  .  gabapentin (NEURONTIN) 400 MG capsule Take 400mg  4 times daily  120 capsule  2  . hydrOXYzine (VISTARIL) 25 MG capsule Take 25 mg by mouth at bedtime.      . lipase/protease/amylase (CREON) 12000 UNITS CPEP capsule Take 2 capsules (24,000 Units total) by mouth 3 (three) times daily before meals.  270 capsule  0  . loratadine (CLARITIN) 10 MG tablet Take 10 mg by mouth daily.      Marland Kitchen LORazepam (ATIVAN) 0.5 MG tablet Take 1 tablet (0.5 mg total) by mouth 3 (three) times daily.  30 tablet  2  . lovastatin (MEVACOR) 40 MG tablet Take 1 tablet by mouth at   bedtime  90 tablet  3  . moxifloxacin (VIGAMOX) 0.5 % ophthalmic solution Place 1 drop into the left eye 3 (three) times daily.       . Multiple Vitamin  (MULTIVITAMIN WITH MINERALS) TABS tablet Take 1 tablet by mouth daily.      Marland Kitchen omeprazole (PRILOSEC OTC) 20 MG tablet Take 20 mg by mouth 2 (two) times daily.       . polycarbophil (FIBERCON) 625 MG tablet Take 1,250 mg by mouth at bedtime.      . topiramate (TOPAMAX) 200 MG tablet Take 1 tablet by mouth   twice a day  60 tablet  5  . UNABLE TO FIND Apply 1 application topically 2 (two) times daily. Doxep/Amanta/DXM/Lidocaine 25 mg for her feet      . vitamin E 400 UNIT capsule Take 800 Units by mouth 2 (two) times daily. For tartive dyskinesia      . zolpidem (AMBIEN) 10 MG tablet Take 1 tablet (10 mg total) by mouth at bedtime as needed for sleep. 1/2 to 1 tab at bedtime  30 tablet  1  . cephALEXin (KEFLEX) 500 MG capsule Take 1 capsule (500 mg total) by mouth 4 (four) times daily.  28 capsule  0  . levothyroxine (SYNTHROID, LEVOTHROID) 175 MCG tablet Take 1 tablet by mouth   every day  30 tablet  5  . meclizine (ANTIVERT) 25 MG tablet Take 25 mg by mouth every 6 (six) hours as needed for dizziness.       . promethazine (PHENERGAN) 12.5 MG tablet Take 1 tablet (12.5 mg total) by mouth 4 (four) times daily -  before meals and at bedtime.  30 tablet  0   No current facility-administered medications for this visit.    Objective: BP 118/72  Pulse 88  Temp(Src) 98.5 F (36.9 C)  Wt 146 lb (66.225 kg) Gen: NAD, resting comfortably in chair CV: RRR no murmurs rubs or gallops Lungs: CTAB no crackles, wheeze, rhonchi Abdomen: soft/nondistended/normal bowel sounds. Patient does have diffuse tenderness throughout her abdomen and is jumpy with exam but does not have rebound or guarding Ext: no edema Skin: warm, dry Neuro: grossly normal, moves all extremities  Results for orders placed in visit on 01/29/14 (from the past 24 hour(s))  POCT URINALYSIS DIPSTICK     Status: None   Collection Time    01/29/14 10:53 AM      Result Value Ref Range   Color, UA yellow     Clarity, UA cloudy      Glucose, UA n     Bilirubin, UA n     Ketones, UA n     Spec Grav, UA 1.010     Blood, UA 1+     pH, UA 7.0     Protein, UA trace  Urobilinogen, UA 0.2     Nitrite, UA n     Leukocytes, UA large (3+)    COMPREHENSIVE METABOLIC PANEL     Status: Abnormal   Collection Time    01/29/14 11:36 AM      Result Value Ref Range   Sodium 141  135 - 145 mEq/L   Potassium 4.2  3.5 - 5.1 mEq/L   Chloride 108  96 - 112 mEq/L   CO2 23  19 - 32 mEq/L   Glucose, Bld 64 (*) 70 - 99 mg/dL   BUN 13  6 - 23 mg/dL   Creatinine, Ser 0.9  0.4 - 1.2 mg/dL   Total Bilirubin 0.7  0.2 - 1.2 mg/dL   Alkaline Phosphatase 122 (*) 39 - 117 U/L   AST 37  0 - 37 U/L   ALT 42 (*) 0 - 35 U/L   Total Protein 6.4  6.0 - 8.3 g/dL   Albumin 3.6  3.5 - 5.2 g/dL   Calcium 9.0  8.4 - 10.5 mg/dL   GFR 65.82  >60.00 mL/min  CBC WITH DIFFERENTIAL     Status: None   Collection Time    01/29/14 11:36 AM      Result Value Ref Range   WBC 9.0  4.0 - 10.5 K/uL   RBC 4.17  3.87 - 5.11 Mil/uL   Hemoglobin 13.4  12.0 - 15.0 g/dL   HCT 39.4  36.0 - 46.0 %   MCV 94.4  78.0 - 100.0 fl   MCHC 34.1  30.0 - 36.0 g/dL   RDW 14.4  11.5 - 15.5 %   Platelets 156.0  150.0 - 400.0 K/uL   Neutrophils Relative % 70.3  43.0 - 77.0 %   Lymphocytes Relative 19.1  12.0 - 46.0 %   Monocytes Relative 7.6  3.0 - 12.0 %   Eosinophils Relative 2.6  0.0 - 5.0 %   Basophils Relative 0.4  0.0 - 3.0 %   Neutro Abs 6.3  1.4 - 7.7 K/uL   Lymphs Abs 1.7  0.7 - 4.0 K/uL   Monocytes Absolute 0.7  0.1 - 1.0 K/uL   Eosinophils Absolute 0.2  0.0 - 0.7 K/uL   Basophils Absolute 0.0  0.0 - 0.1 K/uL  LIPASE     Status: Abnormal   Collection Time    01/29/14 11:36 AM      Result Value Ref Range   Lipase 9.0 (*) 11.0 - 59.0 U/L    Assessment/Plan:  UTI Suspect UTI based off urinalysis and dysuria. Do not think this fully accounts for abdominal pain but may simply have lowered the threshold of pain and now stomach more sensitive. Keflex for 7  days and send urine for culture  Abdominal pain Lipase not elevated but only has a portion of pancreas. CBC without obvious infection. Slight hyperglycemia but CMET otherwise largely normal. Patient had a benign abdominal exam. Hopeful treatment of UTI will help. If not she plans to follow up with surgery given history of recent Whipple procedure  Meds ordered this encounter  Medications  . cephALEXin (KEFLEX) 500 MG capsule    Sig: Take 1 capsule (500 mg total) by mouth 4 (four) times daily.    Dispense:  28 capsule    Refill:  0

## 2014-02-01 LAB — URINE CULTURE: Colony Count: >=100000

## 2014-02-13 ENCOUNTER — Telehealth: Payer: Self-pay | Admitting: Neurology

## 2014-02-13 NOTE — Telephone Encounter (Signed)
Patient calling to state that she is still having problems with her feet hurting very badly. Please return call and advise.

## 2014-02-13 NOTE — Telephone Encounter (Signed)
Called patient and she wants to come in a see Dr.Yan because of pain. I scheduled patient for appt with Dr.Yan On Tuesday. Patient states she will be fine until then.

## 2014-02-14 ENCOUNTER — Other Ambulatory Visit (INDEPENDENT_AMBULATORY_CARE_PROVIDER_SITE_OTHER): Payer: Self-pay | Admitting: General Surgery

## 2014-02-14 DIAGNOSIS — C251 Malignant neoplasm of body of pancreas: Secondary | ICD-10-CM

## 2014-02-15 ENCOUNTER — Encounter (HOSPITAL_COMMUNITY): Payer: Self-pay | Admitting: Emergency Medicine

## 2014-02-15 ENCOUNTER — Emergency Department (HOSPITAL_COMMUNITY): Payer: Medicare Other

## 2014-02-15 ENCOUNTER — Emergency Department (HOSPITAL_COMMUNITY)
Admission: EM | Admit: 2014-02-15 | Discharge: 2014-02-15 | Disposition: A | Discharge status: Home / Self Care | Payer: Medicare Other | Attending: Emergency Medicine | Admitting: Emergency Medicine

## 2014-02-15 DIAGNOSIS — K219 Gastro-esophageal reflux disease without esophagitis: Secondary | ICD-10-CM | POA: Diagnosis not present

## 2014-02-15 DIAGNOSIS — F3289 Other specified depressive episodes: Secondary | ICD-10-CM | POA: Diagnosis not present

## 2014-02-15 DIAGNOSIS — Z8709 Personal history of other diseases of the respiratory system: Secondary | ICD-10-CM | POA: Insufficient documentation

## 2014-02-15 DIAGNOSIS — N390 Urinary tract infection, site not specified: Secondary | ICD-10-CM | POA: Diagnosis not present

## 2014-02-15 DIAGNOSIS — N3080 Other cystitis without hematuria: Secondary | ICD-10-CM | POA: Diagnosis not present

## 2014-02-15 DIAGNOSIS — M1990 Unspecified osteoarthritis, unspecified site: Secondary | ICD-10-CM | POA: Insufficient documentation

## 2014-02-15 DIAGNOSIS — Z8509 Personal history of malignant neoplasm of other digestive organs: Secondary | ICD-10-CM | POA: Insufficient documentation

## 2014-02-15 DIAGNOSIS — Z792 Long term (current) use of antibiotics: Secondary | ICD-10-CM | POA: Insufficient documentation

## 2014-02-15 DIAGNOSIS — Z79899 Other long term (current) drug therapy: Secondary | ICD-10-CM | POA: Diagnosis not present

## 2014-02-15 DIAGNOSIS — R109 Unspecified abdominal pain: Secondary | ICD-10-CM | POA: Diagnosis not present

## 2014-02-15 DIAGNOSIS — K7689 Other specified diseases of liver: Secondary | ICD-10-CM | POA: Diagnosis not present

## 2014-02-15 DIAGNOSIS — Z8673 Personal history of transient ischemic attack (TIA), and cerebral infarction without residual deficits: Secondary | ICD-10-CM | POA: Diagnosis not present

## 2014-02-15 DIAGNOSIS — E039 Hypothyroidism, unspecified: Secondary | ICD-10-CM | POA: Diagnosis not present

## 2014-02-15 DIAGNOSIS — R011 Cardiac murmur, unspecified: Secondary | ICD-10-CM | POA: Insufficient documentation

## 2014-02-15 DIAGNOSIS — Z7982 Long term (current) use of aspirin: Secondary | ICD-10-CM | POA: Insufficient documentation

## 2014-02-15 DIAGNOSIS — I10 Essential (primary) hypertension: Secondary | ICD-10-CM | POA: Diagnosis not present

## 2014-02-15 DIAGNOSIS — F329 Major depressive disorder, single episode, unspecified: Secondary | ICD-10-CM | POA: Diagnosis not present

## 2014-02-15 LAB — CBC WITH DIFFERENTIAL/PLATELET
Basophils Absolute: 0 10*3/uL (ref 0.0–0.1)
Basophils Relative: 0 % (ref 0–1)
EOS PCT: 3 % (ref 0–5)
Eosinophils Absolute: 0.2 10*3/uL (ref 0.0–0.7)
HEMATOCRIT: 44.4 % (ref 36.0–46.0)
Hemoglobin: 14.8 g/dL (ref 12.0–15.0)
LYMPHS ABS: 2 10*3/uL (ref 0.7–4.0)
LYMPHS PCT: 24 % (ref 12–46)
MCH: 32.5 pg (ref 26.0–34.0)
MCHC: 33.3 g/dL (ref 30.0–36.0)
MCV: 97.4 fL (ref 78.0–100.0)
MONOS PCT: 6 % (ref 3–12)
Monocytes Absolute: 0.5 10*3/uL (ref 0.1–1.0)
Neutro Abs: 5.7 10*3/uL (ref 1.7–7.7)
Neutrophils Relative %: 67 % (ref 43–77)
Platelets: 153 10*3/uL (ref 150–400)
RBC: 4.56 MIL/uL (ref 3.87–5.11)
RDW: 14.3 % (ref 11.5–15.5)
WBC: 8.4 10*3/uL (ref 4.0–10.5)

## 2014-02-15 LAB — URINE MICROSCOPIC-ADD ON

## 2014-02-15 LAB — COMPREHENSIVE METABOLIC PANEL
ALT: 38 U/L — ABNORMAL HIGH (ref 0–35)
ANION GAP: 14 (ref 5–15)
AST: 42 U/L — ABNORMAL HIGH (ref 0–37)
Albumin: 3.9 g/dL (ref 3.5–5.2)
Alkaline Phosphatase: 142 U/L — ABNORMAL HIGH (ref 39–117)
BUN: 13 mg/dL (ref 6–23)
CALCIUM: 9.1 mg/dL (ref 8.4–10.5)
CO2: 24 meq/L (ref 19–32)
CREATININE: 0.83 mg/dL (ref 0.50–1.10)
Chloride: 105 mEq/L (ref 96–112)
GFR, EST AFRICAN AMERICAN: 79 mL/min — AB (ref >90)
GFR, EST NON AFRICAN AMERICAN: 68 mL/min — AB (ref >90)
GLUCOSE: 99 mg/dL (ref 70–99)
Potassium: 4.2 mEq/L (ref 3.7–5.3)
Sodium: 143 mEq/L (ref 137–147)
Total Bilirubin: 0.4 mg/dL (ref 0.3–1.2)
Total Protein: 7.1 g/dL (ref 6.0–8.3)

## 2014-02-15 LAB — URINALYSIS, ROUTINE W REFLEX MICROSCOPIC
Bilirubin Urine: NEGATIVE
Glucose, UA: NEGATIVE mg/dL
Ketones, ur: NEGATIVE mg/dL
Nitrite: NEGATIVE
PROTEIN: NEGATIVE mg/dL
Specific Gravity, Urine: 1.019 (ref 1.005–1.030)
UROBILINOGEN UA: 0.2 mg/dL (ref 0.0–1.0)
pH: 6 (ref 5.0–8.0)

## 2014-02-15 MED ORDER — IOHEXOL 300 MG/ML  SOLN
25.0000 mL | Freq: Once | INTRAMUSCULAR | Status: AC | PRN
  Administered 2014-02-15: 25 mL via ORAL

## 2014-02-15 MED ORDER — AMOXICILLIN-POT CLAVULANATE 875-125 MG PO TABS: 1.0000 | ORAL_TABLET | Freq: Two times a day (BID) | ORAL | Status: DC

## 2014-02-15 MED ORDER — IOHEXOL 300 MG/ML  SOLN
100.0000 mL | Freq: Once | INTRAMUSCULAR | Status: AC | PRN
  Administered 2014-02-15: 100 mL via INTRAVENOUS

## 2014-02-15 NOTE — ED Notes (Signed)
Pt placed on monitor and into gown  Pt monitored by blood pressure, pulse ox, and 5 lead. Pts son remains at bedside.

## 2014-02-15 NOTE — Discharge Instructions (Signed)

## 2014-02-15 NOTE — ED Notes (Signed)
Pt remains monitored by blood pressure, pulse ox, and 5 lead. pts son remains at bedside.

## 2014-02-15 NOTE — ED Provider Notes (Signed)
CSN: [id number]     Arrival date & time 02/15/14  1418 History   First MD Initiated Contact with Patient 02/15/14 1608     Chief Complaint  Patient presents with  . Urinary Tract Infection  . Abdominal Pain     (Consider location/radiation/quality/duration/timing/severity/associated sxs/prior Treatment) Patient is a 73 y.o. female presenting with dysuria.  Dysuria Pain quality:  Burning Pain severity:  Moderate Onset quality:  Sudden Duration:  4 hours Timing:  Constant Progression:  Unchanged Chronicity:  Recurrent Relieved by:  Nothing Ineffective treatments: pt finished a week long course of keflex 7 days ago.  Urinary symptoms: frequent urination   Urinary symptoms: no hematuria   Associated symptoms: abdominal pain and nausea   Associated symptoms: no fever and no vomiting     Past Medical History  Diagnosis Date  . Osteoarthritis   . Hiatal hernia   . Chronic maxillary sinusitis     neti pot  . Depression     alone a lot  . Eustachian tube dysfunction   . Tardive dyskinesia     possibly reglan, vitamin E helps  . Mitral valve prolapse     antibiotics before dental procedures  . GERD (gastroesophageal reflux disease)   . Allergic rhinitis   . Glaucoma   . Hypothyroid   . Hypertension   . Heart murmur     hx. "MVP" -predental antibiotics  . Memory loss     short term memory loss  . Urgency of urination     some UTI in past  . Stroke     mini storkes left leg paraylsis. patient denies weakness 01/08/14.   . Cancer 07/10/13    Pancreatic cancer with MRI scan 06-19-13   Past Surgical History  Procedure Laterality Date  . Knee surgery Left     x 5, total knee Left knee  . 1 baker cyst removed    . Dilation and curettage of uterus      x3  . Lumbar spine surgery      x2  . Abdominal hysterectomy      including ovaries  . Lumbar spine surgery      cyst  . Joint replacement      LTKA  . Blepharoplasty Bilateral     with cataract surgery  . Eye  surgery Right     cataract  . Eus N/A 07/10/2013    Procedure: ESOPHAGEAL ENDOSCOPIC ULTRASOUND (EUS) RADIAL;  Surgeon: Arta Silence, MD;  Location: [facility];  Service: Endoscopy;  Laterality: N/A;  . Fine needle aspiration N/A 07/10/2013    Procedure: FINE NEEDLE ASPIRATION (FNA) LINEAR;  Surgeon: Arta Silence, MD;  Location: [facility];  Service: Endoscopy;  Laterality: N/A;  possible fna  . Back surgery      fusion  . Breast surgery      Biopsy left 2 times  . Colonoscopy w/ polypectomy      2004 last colonoscopy, no polyps  . Laparoscopy N/A 08/07/2013    Procedure: LAPAROSCOPY DIAGNOSTIC;  Surgeon: [name], MD;  Location: [place];  Service: General;  Laterality: N/A;  . Whipple procedure N/A 08/07/2013    Procedure: WHIPPLE PROCEDURE;  Surgeon: [name], MD;  Location: [place];  Service: General;  Laterality: N/A;  . Esophagogastroduodenoscopy N/A 09/11/2013    Procedure: ESOPHAGOGASTRODUODENOSCOPY (EGD);  Surgeon: Cleotis Nipper, MD;  Location: [facility];  Service: Endoscopy;  Laterality: N/A;  Moderate sedation okay if MAC not available   Family History  Problem  Relation Age of Onset  . Cancer Mother     Breast Cancer with Metastatic disease  . Alzheimer's disease Father   . Cancer Maternal Uncle     prostate   History  Substance Use Topics  . Smoking status: Never Smoker   . Smokeless tobacco: Never Used  . Alcohol Use: No   OB History   Grav Para Term Preterm Abortions TAB SAB Ect Mult Living                 Review of Systems  Constitutional: Negative for fever.  Gastrointestinal: Positive for nausea and abdominal pain. Negative for vomiting.  Genitourinary: Positive for dysuria.  All other systems reviewed and are negative.     Allergies  Celecoxib; Glucosamine; Metoclopramide hcl; Niacin; Other; Phenazopyridine hcl; Sulfonamide derivatives; Trovan; Benzocaine-resorcinol; and Sulfa antibiotics  Home Medications   Prior to Admission  medications   Medication Sig Start Date End Date Taking? Authorizing Provider  acetaminophen (TYLENOL) 325 MG tablet Take 650 mg by mouth every 6 (six) hours as needed for mild pain, moderate pain or fever.   Yes Historical Provider, MD  aluminum & magnesium hydroxide-simethicone (MYLANTA) 500-450-40 MG/5ML suspension Take 5 mLs by mouth every 6 (six) hours as needed for indigestion.   Yes Historical Provider, MD  aspirin 81 MG tablet Take 81 mg by mouth at bedtime.    Yes Historical Provider, MD  atenolol (TENORMIN) 25 MG tablet Take 25 mg by mouth daily.    Yes Historical Provider, MD  carboxymethylcellulose (REFRESH TEARS) 0.5 % SOLN Place 1 drop into both eyes 2 (two) times daily.    Yes Historical Provider, MD  cholecalciferol (VITAMIN D) 1000 UNITS tablet Take 1,000 Units by mouth daily.    Yes Historical Provider, MD  docusate sodium (COLACE) 100 MG capsule Take 200 mg by mouth at bedtime.   Yes Historical Provider, MD  feeding supplement, RESOURCE BREEZE, (RESOURCE BREEZE) LIQD Take 1 Container by mouth daily. 09/12/13  Yes [name], MD  FLUoxetine (PROZAC) 20 MG capsule Take 1 capsule by mouth  daily   Yes Neena Rhymes, MD  gabapentin (NEURONTIN) 400 MG capsule Take 400mg  4 times daily 01/20/14  Yes Dennie Bible, NP  hydrOXYzine (VISTARIL) 25 MG capsule Take 25 mg by mouth at bedtime.   Yes Historical Provider, MD  levothyroxine (SYNTHROID, LEVOTHROID) 175 MCG tablet Take 1 tablet by mouth   every day 07/12/13  Yes Neena Rhymes, MD  lipase/protease/amylase (CREON) 12000 UNITS CPEP capsule Take 2 capsules (24,000 Units total) by mouth 3 (three) times daily before meals. 12/30/13  Yes [name], MD  loratadine (CLARITIN) 10 MG tablet Take 10 mg by mouth daily.   Yes Historical Provider, MD  LORazepam (ATIVAN) 0.5 MG tablet Take 1 tablet (0.5 mg total) by mouth 3 (three) times daily. 01/22/14  Yes Marin Olp, MD  lovastatin (MEVACOR) 40 MG tablet Take 1 tablet by mouth  at   bedtime   Yes Neena Rhymes, MD  Multiple Vitamin (MULTIVITAMIN WITH MINERALS) TABS tablet Take 1 tablet by mouth daily.   Yes Historical Provider, MD  omeprazole (PRILOSEC OTC) 20 MG tablet Take 20 mg by mouth 2 (two) times daily.    Yes Historical Provider, MD  polycarbophil (FIBERCON) 625 MG tablet Take 1,250 mg by mouth at bedtime.   Yes Historical Provider, MD  topiramate (TOPAMAX) 200 MG tablet Take 1 tablet by mouth   twice a day 07/12/13  Yes Heinz Knuckles  Norins, MD  UNABLE TO FIND Apply 1 application topically 4 (four) times daily. Doxep/Amanta/DXM/Lidocaine 25 mg for her feet   Yes Historical Provider, MD  vitamin E 400 UNIT capsule Take 800 Units by mouth 2 (two) times daily. For tartive dyskinesia   Yes Historical Provider, MD  zolpidem (AMBIEN) 10 MG tablet Take 1 tablet (10 mg total) by mouth at bedtime as needed for sleep. 1/2 to 1 tab at bedtime 01/27/14  Yes Dennie Bible, NP  cephALEXin (KEFLEX) 500 MG capsule Take 1 capsule (500 mg total) by mouth 4 (four) times daily. 01/29/14   Marin Olp, MD  meclizine (ANTIVERT) 25 MG tablet Take 25 mg by mouth every 6 (six) hours as needed for dizziness.     Historical Provider, MD  moxifloxacin (VIGAMOX) 0.5 % ophthalmic solution Place 1 drop into the left eye 3 (three) times daily.     Historical Provider, MD  promethazine (PHENERGAN) 12.5 MG tablet Take 1 tablet (12.5 mg total) by mouth 4 (four) times daily -  before meals and at bedtime. 09/12/13   [name], MD   BP 151/71  Pulse 73  Temp(Src) 98.4 F (36.9 C) (Oral)  Resp 19  SpO2 99% Physical Exam  Nursing note and vitals reviewed. Constitutional: She is oriented to person, place, and time. She appears well-developed and well-nourished. No distress.  HENT:  Head: Normocephalic and atraumatic.  Mouth/Throat: Oropharynx is clear and moist.  Eyes: Conjunctivae are normal. Pupils are equal, round, and reactive to light. No scleral icterus.  Neck: Neck supple.   Cardiovascular: Normal rate, regular rhythm, normal heart sounds and intact distal pulses.   No murmur heard. Pulmonary/Chest: Effort normal and breath sounds normal. No stridor. No respiratory distress. She has no rales.  Abdominal: Soft. Bowel sounds are normal. She exhibits no distension. There is generalized tenderness. There is CVA tenderness (bilateral R>L). There is no rigidity, no rebound and no guarding.  Musculoskeletal: Normal range of motion. She exhibits no edema.  Neurological: She is alert and oriented to person, place, and time.  Skin: Skin is warm and dry. No rash noted.  Psychiatric: She has a normal mood and affect. Her behavior is normal.    ED Course  Procedures (including critical care time) Labs Review Labs Reviewed  COMPREHENSIVE METABOLIC PANEL - Abnormal; Notable for the following:    AST 42 (*)    ALT 38 (*)    Alkaline Phosphatase 142 (*)    GFR calc non Af Amer 68 (*)    GFR calc Af Amer 79 (*)    All other components within normal limits  URINALYSIS, ROUTINE W REFLEX MICROSCOPIC - Abnormal; Notable for the following:    APPearance CLOUDY (*)    Hgb urine dipstick TRACE (*)    Leukocytes, UA LARGE (*)    All other components within normal limits  URINE MICROSCOPIC-ADD ON - Abnormal; Notable for the following:    Bacteria, UA FEW (*)    Crystals CA OXALATE CRYSTALS (*)    All other components within normal limits  CBC WITH DIFFERENTIAL    Imaging Review Ct Abdomen Pelvis W Contrast  02/15/2014   CLINICAL DATA:  Bilateral low abdominal pain.  EXAM: CT ABDOMEN AND PELVIS WITH CONTRAST  TECHNIQUE: Multidetector CT imaging of the abdomen and pelvis was performed using the standard protocol following bolus administration of intravenous contrast.  CONTRAST:  159mL OMNIPAQUE IOHEXOL 300 MG/ML  SOLN  COMPARISON:  None.  FINDINGS: Lung base reticular  opacity consistent combination of subsegmental atelectasis and scarring. Heart is normal in size. Mild coronary  artery calcifications.  There is heterogeneous fatty infiltration of the liver with areas of differential attenuation. No liver mass or focal lesion. Intrahepatic biliary air is noted. Stent extends from the right hepatic duct to the duodenum.  Normal spleen.  Gallbladder is been resected. In the pancreatic head has been resected as part of a Whipple procedure. Remainder the pancreas is atrophic but otherwise unremarkable.  No adrenal masses.  Sub cm low-density lesion arises from the inferior margin of the left kidney, likely a cyst. Kidneys are otherwise unremarkable. No hydronephrosis. Normal ureters.  Bladder shows irregular wall thickening. Is mildly distended. No bladder mass or stone.  Uterus is surgically absent.  No pelvic masses.  No pathologically enlarged lymph nodes.  No ascites.  There are several left colon diverticula. No diverticulitis. Colon otherwise unremarkable. Patient has a gastrojejunostomy. Small bowel is unremarkable. Normal appendix is visualized.  There is a small fat containing paraumbilical hernia.  There has been a previous L4, L5 and S1 posterior lumbar spine fusion. Orthopedic hardware and bone graft material is well-seated. There is mild depression of the upper endplate of L3, which appears chronic. Mild compression fractures of T10 and T7, also likely chronic. Bones are demineralized. No osteoblastic or osteolytic lesions.  IMPRESSION: 1. Bladder wall is irregularly thickened. This is consistent with cystitis. No bladder mass or stone. 2. No other acute findings. 3. Extensive hepatic steatosis. 4. Status post Whipple procedure with resection of the pancreatic head and gallbladder. The common duct is diverted into the duodenum and supported by a common duct stent. Status post gastrojejunostomy. 5. Several spinal compression fractures, presumed old. Status post previous low lumbar spine posterior fusion.   Electronically Signed   By: Lajean Manes M.D.   On: 02/15/2014 19:32  All  radiology studies independently viewed by me.      EKG Interpretation None      MDM   Final diagnoses:  Acute lower UTI    73 yo female with Whipple procedure earlier this year presenting with dysuria and abdominal pain.  Finished a week of keflex for UTI 1 week ago.  Abdominal pain improved with treatment, but returned today.  Abdomen pretty tender on exam, but soft.  She has plans to have CT done next week with her Surgeon, but I think we should perform this test today due to her significant abdominal tenderness.    As regards her UA, she has significant pyuria with few bacteria.  Since she also has dysuria, I think she needs treatment for presumed UTI.  Urine culture from earlier this month showed proteus susceptible to cephalosporins and augmentin, otherwise resistant to PO abx.    CT negative except for cystitis. DC with Augmentin.  Houston Siren III, MD 02/16/14 [phone]

## 2014-02-18 LAB — URINE CULTURE: Colony Count: >=100000

## 2014-02-19 ENCOUNTER — Other Ambulatory Visit: Payer: Medicare Other

## 2014-02-19 ENCOUNTER — Telehealth (HOSPITAL_BASED_OUTPATIENT_CLINIC_OR_DEPARTMENT_OTHER): Payer: Self-pay | Admitting: Emergency Medicine

## 2014-02-19 NOTE — Telephone Encounter (Signed)
Post ED Visit - Positive Culture Follow-up  Culture report reviewed by antimicrobial stewardship pharmacist: []  [place], Pharm.D., BCPS []  Heide Guile, Pharm.D., BCPS []  Alycia Rossetti, Pharm.D., BCPS []  Leaf, [place].D., BCPS, AAHIVP []  Legrand Como, Pharm.D., BCPS, AAHIVP [x]  Carly Sabat, Pharm.D. []  Elenor Quinones, Pharm.D.  Positive urine culture >100,000 colonies/ml Proteus  Treated with amoxicillin 875-125 mg po tabs one tab every 12 hours, 14 tabs, organism sensitive to the same and no further patient follow-up is required at this time.  Hazle Nordmann 02/19/2014, 11:44 AM

## 2014-02-20 ENCOUNTER — Telehealth: Payer: Self-pay | Admitting: Family Medicine

## 2014-02-20 NOTE — Telephone Encounter (Signed)
Dr. Hunter please advise. 

## 2014-02-20 NOTE — Telephone Encounter (Signed)
Pt.notified

## 2014-02-20 NOTE — Telephone Encounter (Signed)
Pt has been taking the Augmentin and had several bowel movements through Tuesday. No bowel movents today and wants to know if she should continue taking it.

## 2014-02-20 NOTE — Telephone Encounter (Signed)
Yes, needs to finish course of antibiotics. If she cannot tolerate augmentin, ask her to call back and I can consider alternative therapy.

## 2014-02-26 ENCOUNTER — Other Ambulatory Visit: Payer: Self-pay | Admitting: Neurology

## 2014-02-28 ENCOUNTER — Telehealth: Payer: Self-pay | Admitting: *Deleted

## 2014-02-28 NOTE — Telephone Encounter (Signed)
Pt states she felt a lump between her breast and it is sore. Is going to see PCP on Monday to have it checked. Will call us if needed

## 2014-03-03 ENCOUNTER — Other Ambulatory Visit: Payer: Self-pay | Admitting: Obstetrics and Gynecology

## 2014-03-03 DIAGNOSIS — N644 Mastodynia: Secondary | ICD-10-CM

## 2014-03-03 DIAGNOSIS — N6002 Solitary cyst of left breast: Secondary | ICD-10-CM

## 2014-03-03 DIAGNOSIS — N6459 Other signs and symptoms in breast: Secondary | ICD-10-CM | POA: Diagnosis not present

## 2014-03-07 ENCOUNTER — Ambulatory Visit (INDEPENDENT_AMBULATORY_CARE_PROVIDER_SITE_OTHER): Payer: Medicare Other | Admitting: Family Medicine

## 2014-03-07 ENCOUNTER — Encounter: Payer: Self-pay | Admitting: Family Medicine

## 2014-03-07 VITALS — BP 120/90 | HR 68 | Temp 98.7°F | Wt 147.0 lb

## 2014-03-07 DIAGNOSIS — J069 Acute upper respiratory infection, unspecified: Secondary | ICD-10-CM

## 2014-03-07 NOTE — Patient Instructions (Signed)
Virus   Restart neti pot 1-2 x a day  Tylenol as needed for sore throat  Try afrin nasal spray for 2-3 days (no more than this) for runny nose  This should last 7-10 days. If lasts longer or new symptoms appear please don't hesitate to come see [place]

## 2014-03-07 NOTE — Progress Notes (Signed)
[name], MD Phone: [phone]  Subjective:   [name] is a 73 y.o. year old very pleasant female patient who presents with the following:  Sore throat/runny nose X 3 days. Worsening runny nose and cough. Sore throat some better. Mild upset stomach.  Taking a pill for runny nose-claritin. Not using neti pot currently. Coughing up phlegm clear. Mild aching sore throat.  ROS- no fevers/chills. No vomiting.   Past Medical History- Patient Active Problem List   Diagnosis Date Noted  . Exocrine pancreatic insufficiency 12/30/2013    Priority: High  . Carcinoma of head of pancreas 07/01/2013    Priority: High  . MITRAL VALVE PROLAPSE 02/26/2007    Priority: High  . Memory loss 01/08/2014    Priority: Medium  . Other abnormal glucose 11/29/2013    Priority: Medium  . 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 Paresthesia of foot 06/06/2012    Priority: Medium  . Syncope 09/26/2011    Priority: Medium  . HYPOTHYROIDISM 02/26/2007    Priority: Medium  . HYPERLIPIDEMIA 02/26/2007    Priority: Medium  . ANXIETY 02/26/2007    Priority: Medium  . DEPRESSION 02/26/2007    Priority: Medium  . HYPERTENSION 02/26/2007    Priority: Medium  . RHINOSINUSITIS, CHRONIC 02/03/2009    Priority: Low  . COLONIC POLYPS 06/05/2008    Priority: Low  . OSTEOARTHRITIS 06/04/2008    Priority: Low  . DIARRHEA, CHRONIC 04/22/2008    Priority: Low  . BACK PAIN 08/03/2007    Priority: Low  . Chronic maxillary sinusitis 04/20/2007    Priority: Low  . TARDIVE DYSKINESIA 02/26/2007    Priority: Low  . GLAUCOMA 02/26/2007    Priority: Low  . EUSTACHIAN TUBE DYSFUNCTION 02/26/2007    Priority: Low  . ALLERGIC RHINITIS 02/26/2007    Priority: Low  . GERD 02/26/2007    Priority: Low  . HIATAL HERNIA 02/26/2007    Priority: Low  . OSTEOPOROSIS 02/26/2007    Priority: Low   Medications- reviewed and updated Current Outpatient Prescriptions    Medication Sig Dispense Refill  . aspirin 81 MG tablet Take 81 mg by mouth at bedtime.       Marland Kitchen atenolol (TENORMIN) 25 MG tablet Take 25 mg by mouth every morning.       . carboxymethylcellulose (REFRESH TEARS) 0.5 % SOLN Place 1 drop into both eyes 2 (two) times daily.       . cholecalciferol (VITAMIN D) 1000 UNITS tablet Take 1,000 Units by mouth daily.       Marland Kitchen docusate sodium (COLACE) 100 MG capsule Take 200 mg by mouth at bedtime.      Marland Kitchen FLUoxetine (PROZAC) 20 MG capsule Take 20 mg by mouth daily.      Marland Kitchen gabapentin (NEURONTIN) 400 MG capsule Take 400 mg by mouth 4 (four) times daily.      . hydrOXYzine (VISTARIL) 25 MG capsule Take 25 mg by mouth at bedtime.      Marland Kitchen levothyroxine (SYNTHROID, LEVOTHROID) 175 MCG tablet Take 175 mcg by mouth daily before breakfast.      . lipase/protease/amylase (CREON) 12000 UNITS CPEP capsule Take 2 capsules (24,000 Units total) by mouth 3 (three) times daily before meals.  270 capsule  0  . loratadine (CLARITIN) 10 MG tablet Take 10 mg by mouth daily.      Marland Kitchen LORazepam (ATIVAN) 0.5 MG tablet Take 1 tablet (0.5 mg total) by mouth 3 (three) times daily.  30 tablet  2  . lovastatin (MEVACOR) 40 MG tablet Take 40 mg by mouth at bedtime.      . Multiple Vitamin (MULTIVITAMIN WITH MINERALS) TABS tablet Take 1 tablet by mouth daily.      Marland Kitchen omeprazole (PRILOSEC OTC) 20 MG tablet Take 20 mg by mouth 2 (two) times daily.       . polycarbophil (FIBERCON) 625 MG tablet Take 1,250 mg by mouth at bedtime.      . topiramate (TOPAMAX) 200 MG tablet Take 200 mg by mouth 2 (two) times daily.      Marland Kitchen UNABLE TO FIND Apply 1 application topically 4 (four) times daily. Doxep/Amanta/DXM/Lidocaine 25 mg for her feet      . vitamin E 400 UNIT capsule Take 800 Units by mouth 2 (two) times daily. For tartive dyskinesia      . zolpidem (AMBIEN) 10 MG tablet Take 1 tablet (10 mg total) by mouth at bedtime as needed for sleep. 1/2 to 1 tab at bedtime  30 tablet  1  . acetaminophen  (TYLENOL) 325 MG tablet Take 650 mg by mouth every 6 (six) hours as needed for mild pain, moderate pain or fever.      Marland Kitchen aluminum & magnesium hydroxide-simethicone (MYLANTA) 500-450-40 MG/5ML suspension Take 5 mLs by mouth every 6 (six) hours as needed for indigestion.      . lactose free nutrition (BOOST) LIQD Take 237 mLs by mouth daily.       No current facility-administered medications for this visit.    Objective: BP 120/90  Pulse 68  Temp(Src) 98.7 F (37.1 C)  Wt 147 lb (66.679 kg) Gen: NAD, resting comfortably in chair, coughs intermittently HEENT: naral turbinates erythematous and swollen, oropharynx normal without pharyngeal exudate, TM normal bilaterally, Mucous membranes are moist.no sinus tenderness CV: RRR no murmurs rubs or gallops. Did not listen at mitral area.  Lungs: CTAB no crackles, wheeze, rhonchi Abdomen: soft/nontender/nondistended/normal bowel sounds.  Skin: warm, dry, no rash  Assessment/Plan:  Upper Respiratory Infection No shortness of breath to suggest PNA as well as no fever and only 3 days symptoms Advised of symptomatic care (see AVS).  Doubt influenza as afebrile Given reasons for return  Advised to avoid decongestants like sudafed due to BP.

## 2014-03-11 ENCOUNTER — Ambulatory Visit
Admission: RE | Admit: 2014-03-11 | Discharge: 2014-03-11 | Disposition: A | Discharge status: Home / Self Care | Payer: Medicare Other | Source: Ambulatory Visit | Attending: Obstetrics and Gynecology | Admitting: Obstetrics and Gynecology

## 2014-03-11 DIAGNOSIS — N644 Mastodynia: Secondary | ICD-10-CM

## 2014-03-11 DIAGNOSIS — N6002 Solitary cyst of left breast: Secondary | ICD-10-CM

## 2014-03-11 DIAGNOSIS — R928 Other abnormal and inconclusive findings on diagnostic imaging of breast: Secondary | ICD-10-CM | POA: Diagnosis not present

## 2014-03-19 DIAGNOSIS — F332 Major depressive disorder, recurrent severe without psychotic features: Secondary | ICD-10-CM | POA: Diagnosis not present

## 2014-03-26 ENCOUNTER — Telehealth: Payer: Self-pay | Admitting: Nurse Practitioner

## 2014-03-26 NOTE — Telephone Encounter (Signed)
Called and spoke to patient. Patient had surgery in April . Patient is scheduled to see Dr.Yan. 03-27-2014

## 2014-03-26 NOTE — Telephone Encounter (Signed)
Patient stated feet and toes are burning and stinging.  Patient stated gabapentin (NEURONTIN) 400 MG capsule isn't helping with discomfort.  Questioning if there's another medication she could try or increase dosage for Gabapentin.  Please call anytime and may leave detailed message

## 2014-03-26 NOTE — Telephone Encounter (Signed)
[name] please read 02/13/14 phone note. It does not appear appt was made for patient to f/u with Dr. Krista Blue

## 2014-03-27 ENCOUNTER — Encounter: Payer: Self-pay | Admitting: Neurology

## 2014-03-27 ENCOUNTER — Ambulatory Visit (INDEPENDENT_AMBULATORY_CARE_PROVIDER_SITE_OTHER): Payer: Medicare Other | Admitting: Neurology

## 2014-03-27 VITALS — BP 136/72 | HR 88 | Ht 62.0 in | Wt 148.0 lb

## 2014-03-27 DIAGNOSIS — M79672 Pain in left foot: Secondary | ICD-10-CM | POA: Diagnosis not present

## 2014-03-27 DIAGNOSIS — M79671 Pain in right foot: Secondary | ICD-10-CM

## 2014-03-27 DIAGNOSIS — R269 Unspecified abnormalities of gait and mobility: Secondary | ICD-10-CM

## 2014-03-27 DIAGNOSIS — M545 Low back pain, unspecified: Secondary | ICD-10-CM | POA: Insufficient documentation

## 2014-03-27 HISTORY — DX: Unspecified abnormalities of gait and mobility: R26.9

## 2014-03-27 MED ORDER — GABAPENTIN 300 MG PO CAPS: ORAL_CAPSULE | ORAL | Status: DC

## 2014-03-27 MED ORDER — NORTRIPTYLINE HCL 25 MG PO CAPS: ORAL_CAPSULE | ORAL | Status: DC

## 2014-03-27 NOTE — Progress Notes (Signed)
[facility]  PATIENT: [name] DOB: 17-Jul-1940  HISTORY OF PRESENT ILLNESS:Ms. Krahn, 73 year old female returns for followup. She is accompanied by her son  I have seen her over the past few years for bilateral lower extremity pain, bilateral feet paresthesia  She had past medical history of hypertension, hyperlipidemia, depression, is on polypharmacy treatment, including Prozac 20 mg every day, Ativan 3 times a day, Topamax 200 mg twice a day,   She presented with more than 10 years history of bilateral feet paresthesia, getting worse since 2010, she described bilateral feet numbness tingling, going up to her legs, her feet felt like it was broken sometimes, her feet burns, ice pack helps some, but sometimes she put on socks, wrapping tightly around her socks, which seems to help her symptoms some, her feet pain getting worse after bearing weight.  She also has a history of chronic low back pain, had a history of low back decompression surgery by Dr. Lorin Mercy in 2004, she denies shooting pain to her neck, no bowel bladder incontinence, mild gait difficulty due to feet pain, no significant weakness   Most recent laboratory evaluation showed normal vitamin B12, TSH, CMP, elevated WBC 15.7  CBC, RPR, ANA, vitamin B12, TSH, mild elevated C. reactive protein of 11.2,   She has tried compounding cream, which has made her skin peel, she is taking gabapentin 300 mg 3 times a day, which helped her 60%, also make her tired, the most bothersome symptoms at nighttime, she complains of bilateral plantar feet burning pain, she has to wrap her toes together, soak it in hot water, which only helped her temporarily.   Over the past few years, she also developed gradual worsening gait difficulty, she presented with right thoracic area pain, eventually was diagnosed with pancreatic cancer, had a Whipple procedure in March 2015, no chemo, radiation therapy  UPDATE OCt 29th 2015:  She  continued to complains of bilateral feet pain, numbness, getting worse after bearing weight, she is taking up to 2400 mg of gabapentin each day, still has difficulty sleeping because her feet wake her up every couple hours, can be an help some  Worsening gait difficulty, tendency to fall, no bowel or bladder incontinence,   REVIEW OF SYSTEMS: Full 14 system review of systems performed and notable only for those listed, all others are neg:  As above   ALLERGIES: Allergies  Allergen Reactions  . Other Other (See Comments)    According to patient Trilafor and Reglan cause Tardive Dyskinesia  . Metoclopramide Hcl Other (See Comments)    Shaking; caused tardive dyskinesia  . Niacin Other (See Comments)    shaking  . Trovan [Alatrofloxacin Mesylate] Other (See Comments)    Caused shaking and nervousness  . Benzocaine-Resorcinol Rash  . Celecoxib Other (See Comments)    unknown  . Glucosamine Other (See Comments)    unknown  . Phenazopyridine Hcl Other (See Comments)    unknown  . Sulfa Antibiotics Nausea And Vomiting and Rash  . Sulfonamide Derivatives Nausea And Vomiting and Rash    HOME MEDICATIONS: Outpatient Prescriptions Prior to Visit  Medication Sig Dispense Refill  . acetaminophen (TYLENOL) 325 MG tablet Take 650 mg by mouth every 6 (six) hours as needed for mild pain, moderate pain or fever.      Marland Kitchen aluminum & magnesium hydroxide-simethicone (MYLANTA) 500-450-40 MG/5ML suspension Take 5 mLs by mouth every 6 (six) hours as needed for indigestion.      Marland Kitchen aspirin 81  MG tablet Take 81 mg by mouth at bedtime.       Marland Kitchen atenolol (TENORMIN) 25 MG tablet Take 25 mg by mouth every morning.       . carboxymethylcellulose (REFRESH TEARS) 0.5 % SOLN Place 1 drop into both eyes 2 (two) times daily.       . cholecalciferol (VITAMIN D) 1000 UNITS tablet Take 1,000 Units by mouth daily.       Marland Kitchen docusate sodium (COLACE) 100 MG capsule Take 200 mg by mouth at bedtime.      Marland Kitchen FLUoxetine  (PROZAC) 20 MG capsule Take 20 mg by mouth daily.      Marland Kitchen gabapentin (NEURONTIN) 400 MG capsule Take 400 mg by mouth 4 (four) times daily.      . hydrOXYzine (VISTARIL) 25 MG capsule Take 25 mg by mouth at bedtime.      . lactose free nutrition (BOOST) LIQD Take 237 mLs by mouth daily.      Marland Kitchen levothyroxine (SYNTHROID, LEVOTHROID) 175 MCG tablet Take 175 mcg by mouth daily before breakfast.      . lipase/protease/amylase (CREON) 12000 UNITS CPEP capsule Take 2 capsules (24,000 Units total) by mouth 3 (three) times daily before meals.  270 capsule  0  . loratadine (CLARITIN) 10 MG tablet Take 10 mg by mouth daily.      Marland Kitchen LORazepam (ATIVAN) 0.5 MG tablet Take 1 tablet (0.5 mg total) by mouth 3 (three) times daily.  30 tablet  2  . lovastatin (MEVACOR) 40 MG tablet Take 40 mg by mouth at bedtime.      . Multiple Vitamin (MULTIVITAMIN WITH MINERALS) TABS tablet Take 1 tablet by mouth daily.      Marland Kitchen omeprazole (PRILOSEC OTC) 20 MG tablet Take 20 mg by mouth 2 (two) times daily.       . polycarbophil (FIBERCON) 625 MG tablet Take 1,250 mg by mouth at bedtime.      . topiramate (TOPAMAX) 200 MG tablet Take 200 mg by mouth 2 (two) times daily.      Marland Kitchen UNABLE TO FIND Apply 1 application topically 4 (four) times daily. Doxep/Amanta/DXM/Lidocaine 25 mg for her feet      . vitamin E 400 UNIT capsule Take 800 Units by mouth 2 (two) times daily. For tartive dyskinesia      . zolpidem (AMBIEN) 10 MG tablet Take 1 tablet (10 mg total) by mouth at bedtime as needed for sleep. 1/2 to 1 tab at bedtime  30 tablet  1   No facility-administered medications prior to visit.    PAST MEDICAL HISTORY: Past Medical History  Diagnosis Date  . Osteoarthritis   . Hiatal hernia   . Chronic maxillary sinusitis     neti pot  . Depression     alone a lot  . Eustachian tube dysfunction   . Tardive dyskinesia     possibly reglan, vitamin E helps  . Mitral valve prolapse     antibiotics before dental procedures  . GERD  (gastroesophageal reflux disease)   . Allergic rhinitis   . Glaucoma   . Hypothyroid   . Hypertension   . Heart murmur     hx. "MVP" -predental antibiotics  . Memory loss     short term memory loss  . Urgency of urination     some UTI in past  . Stroke     mini storkes left leg paraylsis. patient denies weakness 01/08/14.   . Cancer 07/10/13    Pancreatic cancer  with MRI scan 06-19-13    PAST SURGICAL HISTORY: Past Surgical History  Procedure Laterality Date  . Knee surgery Left     x 5, total knee Left knee  . 1 baker cyst removed    . Dilation and curettage of uterus      x3  . Lumbar spine surgery      x2  . Abdominal hysterectomy      including ovaries  . Lumbar spine surgery      cyst  . Joint replacement      LTKA  . Blepharoplasty Bilateral     with cataract surgery  . Eye surgery Right     cataract  . Eus N/A 07/10/2013    Procedure: ESOPHAGEAL ENDOSCOPIC ULTRASOUND (EUS) RADIAL;  Surgeon: Arta Silence, MD;  Location: [facility];  Service: Endoscopy;  Laterality: N/A;  . Fine needle aspiration N/A 07/10/2013    Procedure: FINE NEEDLE ASPIRATION (FNA) LINEAR;  Surgeon: Arta Silence, MD;  Location: [facility];  Service: Endoscopy;  Laterality: N/A;  possible fna  . Back surgery      fusion  . Breast surgery      Biopsy left 2 times  . Colonoscopy w/ polypectomy      2004 last colonoscopy, no polyps  . Laparoscopy N/A 08/07/2013    Procedure: LAPAROSCOPY DIAGNOSTIC;  Surgeon: [name], MD;  Location: [facility];  Service: General;  Laterality: N/A;  . Whipple procedure N/A 08/07/2013    Procedure: WHIPPLE PROCEDURE;  Surgeon: [name], MD;  Location: [facility];  Service: General;  Laterality: N/A;  . Esophagogastroduodenoscopy N/A 09/11/2013    Procedure: ESOPHAGOGASTRODUODENOSCOPY (EGD);  Surgeon: Cleotis Nipper, MD;  Location: [facility] END[facility]COPY;  Service: Endoscopy;  Laterality: N/A;  Moderate sedation okay if MAC not available    FAMILY HISTORY: Family  History  Problem Relation Age of Onset  . Cancer Mother     Breast Cancer with Metastatic disease  . Alzheimer's disease Father   . Cancer Maternal Uncle     prostate    SOCIAL HISTORY: History   Social History  . Marital Status: Widowed    Spouse Name: N/A    Number of Children: 2  . Years of Education: 8   Occupational History  .    Marland Kitchen Retired    Social History Main Topics  . Smoking status: Never Smoker   . Smokeless tobacco: Never Used  . Alcohol Use: No  . Drug Use: No  . Sexual Activity: Not Currently   Other Topics Concern  . 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 Generalized: Well developed,  in no acute distress  Head: normocephalic and atraumatic,. Oropharynx benign  Neck:  Supple, no carotid bruits ,  Neurological examination  Mentation: Alert oriented to time, place, history taking. Follows all commands speech and language fluent.  Cranial nerve II-XII: .Pupils were equal round reactive to light extraocular movements were full, visual field were full on confrontational test. Facial sensation and strength were normal. hearing was intact to finger rubbing bilaterally. Uvula tongue midline. head turning and shoulder shrug were normal and symmetric.Tongue protrusion into cheek strength was normal.  Motor: normal bulk and tone, full strength in the BUE, BLE, No focal  weakness, tenderness of bilateral feet upon deep palpation  Sensory: Length dependent decreased fine touch, pinprick to mid shin level bilaterally, decreased vibratory to toes, preserved proprioception  Coordination: finger-nose-finger, heel-to-shin bilaterally, no dysmetria  Reflexes: Brachioradialis 2/2, biceps 2/2, triceps 2/2, patellar 2/2, Achilles trace, plantar responses were flexor bilaterally.  Gait and Station: Rising up from seated position without assistance, cautious, narrow based, was able to stand on tiptoe, and heels,    DIAGNOSTIC DATA (LABS, IMAGING, TESTING) - I reviewed patient records, labs, notes, testing and imaging myself where available.  Lab Results  Component Value Date   WBC 8.4 02/15/2014   HGB 14.8 02/15/2014   HCT 44.4 02/15/2014   MCV 97.4 02/15/2014   PLT 153 02/15/2014      Component Value Date/Time   NA 143 02/15/2014 1445   K 4.2 02/15/2014 1445   CL 105 02/15/2014 1445   CO2 24 02/15/2014 1445   GLUCOSE 99 02/15/2014 1445   BUN 13 02/15/2014 1445   CREATININE 0.83 02/15/2014 1445   CREATININE 0.81 07/01/2013 1324   CALCIUM 9.1 02/15/2014 1445   PROT 7.1 02/15/2014 1445   ALBUMIN 3.9 02/15/2014 1445   AST 42* 02/15/2014 1445   ALT 38* 02/15/2014 1445   ALKPHOS 142* 02/15/2014 1445   BILITOT 0.4 02/15/2014 1445   GFRNONAA 68* 02/15/2014 1445   GFRAA 79* 02/15/2014 1445    Lab Results  Component Value Date   HGBA1C 5.0 11/28/2013   Lab Results  Component Value Date   VITAMINB12 >1500* 10/29/2013   Lab Results  Component Value Date   TSH 1.50 11/27/2013      ASSESSMENT AND PLAN  73 y.o. year old female  has a past medical history of bilateral foot pain paresthesias for greater than 10 years . 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 Previous electrodiagnostic study was normal, there was no evidence of large fiber peripheral neuropathy, she is now complaining of worsening low back pain, previous lumbar decompression  surgery, pancreatic cancer, status post Whipple procedure in March 2015,  1, differentiation diagnosis of her feet pain also including small fiber neuropathy, lumbosacral radiculopathy, 2. Complete evaluation with skin biopsy, MRI of lumbar 3. Keep current dose of gabapentin 600 mg 4 times a day, add on nortriptyline, 10 mg, titrating to 20 mg every night    [facility] [address], [place], [place] [address] [phone]

## 2014-03-31 ENCOUNTER — Telehealth: Payer: Self-pay | Admitting: Neurology

## 2014-03-31 NOTE — Telephone Encounter (Signed)
Patient says since she started taking Nortriptyline she feels dizzy.  She would like to know if the provider wants her to continue this medication.  Please advise.  Thank you.

## 2014-03-31 NOTE — Telephone Encounter (Signed)
I called her that she took Nortriptyline 10mg  for two night (Oct 29th, Oct 30)   that she felt dizziness, now she has stopped taking nortriptyline, she feels better.  I advise her to stop Nortriptyline, keep follow up.

## 2014-03-31 NOTE — Telephone Encounter (Signed)
Patient stated she took new Rx for gabapentin (NEURONTIN) 300 MG capsule and nortriptyline (PAMELOR) 25 MG capsule as prescribed.  Friday morning she felt very dizzy and called and spoke with Pharmacist.  Pharmacist stated it could be a side effect from nortriptyline (PAMELOR) 25 MG capsule.  Patient stated she still feels dizzy this am, please call and advise.

## 2014-04-03 ENCOUNTER — Telehealth: Payer: Self-pay

## 2014-04-03 ENCOUNTER — Ambulatory Visit: Payer: Medicare Other | Admitting: Oncology

## 2014-04-03 NOTE — Telephone Encounter (Signed)
Called pt to see if she could reschedule todays appt d/t Ned Card, Np being out today. Pt agreed, appt moved to Nov 18th at 1145 with Ned Card, NP. Pt verbalized understanding.

## 2014-04-14 DIAGNOSIS — Z1239 Encounter for other screening for malignant neoplasm of breast: Secondary | ICD-10-CM | POA: Diagnosis not present

## 2014-04-14 DIAGNOSIS — C250 Malignant neoplasm of head of pancreas: Secondary | ICD-10-CM | POA: Diagnosis not present

## 2014-04-14 DIAGNOSIS — K868 Other specified diseases of pancreas: Secondary | ICD-10-CM | POA: Diagnosis not present

## 2014-04-16 ENCOUNTER — Ambulatory Visit: Payer: Medicare Other

## 2014-04-16 ENCOUNTER — Telehealth: Payer: Self-pay | Admitting: Nurse Practitioner

## 2014-04-16 ENCOUNTER — Ambulatory Visit (HOSPITAL_BASED_OUTPATIENT_CLINIC_OR_DEPARTMENT_OTHER): Payer: Medicare Other | Admitting: Nurse Practitioner

## 2014-04-16 VITALS — BP 138/63 | HR 77 | Temp 98.0°F | Resp 17 | Ht 62.0 in | Wt 147.5 lb

## 2014-04-16 DIAGNOSIS — C250 Malignant neoplasm of head of pancreas: Secondary | ICD-10-CM | POA: Diagnosis not present

## 2014-04-16 DIAGNOSIS — Z23 Encounter for immunization: Secondary | ICD-10-CM

## 2014-04-16 DIAGNOSIS — R109 Unspecified abdominal pain: Secondary | ICD-10-CM | POA: Diagnosis not present

## 2014-04-16 DIAGNOSIS — Z8507 Personal history of malignant neoplasm of pancreas: Secondary | ICD-10-CM

## 2014-04-16 MED ORDER — INFLUENZA VAC SPLIT QUAD 0.5 ML IM SUSY
0.5000 mL | PREFILLED_SYRINGE | Freq: Once | INTRAMUSCULAR | Status: AC
  Administered 2014-04-16: 0.5 mL via INTRAMUSCULAR
  Filled 2014-04-16: qty 0.5

## 2014-04-16 NOTE — Telephone Encounter (Signed)
Pt confirmed labs/ov per 11/18 POF, gave pt AVS..... KJ °

## 2014-04-16 NOTE — Progress Notes (Addendum)
  [facility] OFFICE PROGRESS NOTE   Diagnosis:  Pancreas cancer  INTERVAL HISTORY:   [name] returns as scheduled. She reports abdominal pain which has been occurring since surgery. She takes Tylenol as needed. She notes intermittent tenderness at the right abdomen. No significant diarrhea. She has occasional mild nausea. No vomiting. She reports a good appetite.   Objective:  Vital signs in last 24 hours:  Blood pressure 138/63, pulse 77, temperature 98 F (36.7 C), temperature source Oral, resp. rate 17, height 5\' 2"  (1.575 m), weight 147 lb 8 oz (66.906 kg), SpO2 98 %.    HEENT: no thrush or ulcers. Lymphatics: no palpable cervical, supraclavicular, axillary or inguinal lymph nodes. Resp: lungs clear bilaterally. Cardio: regular rate and rhythm. GI: abdomen is soft. Mild diffuse abdominal tenderness right abdomen greater than left. No mass. No organomegaly. Vascular: no leg edema. Calves soft and nontender.   Lab Results:  Lab Results  Component Value Date   WBC 8.4 02/15/2014   HGB 14.8 02/15/2014   HCT 44.4 02/15/2014   MCV 97.4 02/15/2014   PLT 153 02/15/2014   NEUTROABS 5.7 02/15/2014    Imaging:  No results found.  Medications: I have reviewed the patient's current medications.  Assessment/Plan: 1. 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  2. Nausea following the Whipple procedure-improved 3. Mild thrombocytopenia on a CBC 07/01/2013  4. Abdominal pain. Question related to surgery.   Disposition: [name] appears stable. She remains in clinical remission from pancreas cancer. We will obtain a CA  19-9 today.  The abdominal pain may be related to surgery. She understands to contact the office with progressive worsening of the pain or any new symptoms.  She will return for a followup visit and CA 19-9 in 3 months. She will contact the office in the interim as outlined above or with any other problems. The influenza vaccine will be administered today.  Patient seen with Dr. [name].  Ned Card ANP/GNP-BC   04/16/2014  12:02 PM  This was a shared visit with Ned Card. [name] reports mild abdominal pain since undergoing the pancreaticoduodenectomy procedure. I suspect the discomfort is related to surgery. I have a low clinical suspicion for recurrent cancer. She had a negative abdomen CT 2 months ago.  She will contact us for increased pain or new symptoms. We will see her in 3 months.  Julieanne Manson, MD

## 2014-04-17 ENCOUNTER — Telehealth: Payer: Self-pay | Admitting: *Deleted

## 2014-04-17 LAB — CANCER ANTIGEN 19-9: CA 19-9: <1.2 U/mL (ref <35.0)

## 2014-04-17 NOTE — Telephone Encounter (Signed)
Called and informed patient of normal tumor marker.  Per Elby Showers. Marcello Moores, NP.   Patient verbalized understanding.

## 2014-04-17 NOTE — Telephone Encounter (Signed)
-----   Message from Owens Shark, NP sent at 04/17/2014  8:48 AM EST ----- Please let her know tumor marker was normal.

## 2014-04-18 ENCOUNTER — Telehealth: Payer: Self-pay | Admitting: Family Medicine

## 2014-04-18 MED ORDER — LEVOTHYROXINE SODIUM 175 MCG PO TABS: 175.0000 ug | ORAL_TABLET | Freq: Every day | ORAL | Status: DC

## 2014-04-18 MED ORDER — TOPIRAMATE 200 MG PO TABS: 200.0000 mg | ORAL_TABLET | Freq: Two times a day (BID) | ORAL | Status: DC

## 2014-04-18 NOTE — Telephone Encounter (Signed)
[place], [place] [phone] is requesting re-fills on topiramate (TOPAMAX) 200 MG tablet and levothyroxine (SYNTHROID, LEVOTHROID) 175 MCG tablet

## 2014-04-18 NOTE — Telephone Encounter (Signed)
Medications refilled

## 2014-04-22 ENCOUNTER — Ambulatory Visit
Admission: RE | Admit: 2014-04-22 | Discharge: 2014-04-22 | Disposition: A | Discharge status: Home / Self Care | Payer: Medicare Other | Source: Ambulatory Visit | Attending: Neurology | Admitting: Neurology

## 2014-04-22 DIAGNOSIS — R269 Unspecified abnormalities of gait and mobility: Secondary | ICD-10-CM

## 2014-04-22 DIAGNOSIS — M79671 Pain in right foot: Secondary | ICD-10-CM

## 2014-04-22 DIAGNOSIS — M545 Low back pain: Secondary | ICD-10-CM

## 2014-04-22 DIAGNOSIS — M79672 Pain in left foot: Secondary | ICD-10-CM

## 2014-04-22 NOTE — Telephone Encounter (Signed)
Can you please re-send the below medications to [facility], they were sent to [facility].

## 2014-04-23 ENCOUNTER — Telehealth: Payer: Self-pay | Admitting: Neurology

## 2014-04-23 MED ORDER — LEVOTHYROXINE SODIUM 175 MCG PO TABS: 175.0000 ug | ORAL_TABLET | Freq: Every day | ORAL | Status: DC

## 2014-04-23 NOTE — Telephone Encounter (Signed)
Medications switched to [facility]

## 2014-04-23 NOTE — Addendum Note (Signed)
Addended by: Clyde Lundborg A on: 04/23/2014 08:23 AM   Modules accepted: Orders

## 2014-04-23 NOTE — Telephone Encounter (Signed)
I have called her MRI lumbar result,  Abnormal MRI lumbar spine (without) demonstrating: 1. At L3-4: disc bulging and facet hypertrophy with moderate biforaminal stenosis  2. At L2-3: disc bulging and facet hypertrophy with mild biforaminal stenosis  3. Posterior decompression and interbody fusion from L4-L5-S1 with metal screws and adjoining hardware.  Hinton Dyer, give her a follow up visit in 1-2 months

## 2014-04-28 ENCOUNTER — Telehealth: Payer: Self-pay | Admitting: Neurology

## 2014-04-28 NOTE — Telephone Encounter (Signed)
Called and spoke to patient she states she is still in a lot of pain. Stated to patient I will send Dr.Yan the message and we will give her a call back.  Dr.Yan you do have opening for tomorrow. Please advise.

## 2014-04-28 NOTE — Telephone Encounter (Signed)
Hinton Dyer, give her a follow up appt with me in next available.

## 2014-04-28 NOTE — Telephone Encounter (Signed)
Patient stated feet were hurting and stinging all weekend.  She stated she did all she could do to help elevate the pain,(had an old Rx of Hydrocodone prescribed previously for another issue) but nothing helped.  Please call and advise.

## 2014-04-28 NOTE — Telephone Encounter (Signed)
Patient coming in tomorrow.   04-29-2014.

## 2014-04-28 NOTE — Telephone Encounter (Signed)
Patient  Coming to see Dr.Yan tomorrow 04-29-2014.

## 2014-04-29 ENCOUNTER — Encounter: Payer: Self-pay | Admitting: Neurology

## 2014-04-29 ENCOUNTER — Ambulatory Visit (INDEPENDENT_AMBULATORY_CARE_PROVIDER_SITE_OTHER): Payer: Medicare Other | Admitting: Neurology

## 2014-04-29 VITALS — BP 120/80 | HR 77 | Ht 62.0 in | Wt 148.0 lb

## 2014-04-29 DIAGNOSIS — R202 Paresthesia of skin: Secondary | ICD-10-CM

## 2014-04-29 MED ORDER — GABAPENTIN 300 MG PO CAPS: ORAL_CAPSULE | ORAL | Status: DC

## 2014-04-29 MED ORDER — ROPINIROLE HCL 0.5 MG PO TABS: ORAL_TABLET | ORAL | Status: DC

## 2014-04-29 NOTE — Progress Notes (Signed)
[facility]  PATIENT: [name] DOB: 05/04/1941  HISTORY OF PRESENT ILLNESS:Ms. Simones, 73 year old female returns for followup. She is accompanied by her son  I have seen her over the past few years for bilateral lower extremity pain, bilateral feet paresthesia  She had past medical history of hypertension, hyperlipidemia, depression, is on polypharmacy treatment, including Prozac 20 mg every day, Ativan 3 times a day, Topamax 200 mg twice a day,   She presented with more than 10 years history of bilateral feet paresthesia, getting worse since 2010, she described bilateral feet numbness tingling, going up to her legs, her feet felt like it was broken sometimes, her feet burns, ice pack helps some, but sometimes she put on socks, wrapping tightly around her socks, which seems to help her symptoms some, her feet pain getting worse after bearing weight.  She also has a history of chronic low back pain, had a history of low back decompression surgery by Dr. Lorin Mercy in 2004, she denies shooting pain to her neck, no bowel bladder incontinence, mild gait difficulty due to feet pain, no significant weakness   Most recent laboratory evaluation showed normal vitamin B12, TSH, CMP, elevated WBC 15.7  CBC, RPR, ANA, vitamin B12, TSH, mild elevated C. reactive protein of 11.2,   She has tried compounding cream, which has made her skin peel, she is taking gabapentin 300 mg 3 times a day, which helped her 60%, also make her tired, the most bothersome symptoms at nighttime, she complains of bilateral plantar feet burning pain, she has to wrap her toes together, soak it in hot water, which only helped her temporarily.   Over the past few years, she also developed gradual worsening gait difficulty, she presented with right thoracic area pain, eventually was diagnosed with pancreatic cancer, had a Whipple procedure in March 2015, no chemo, radiation therapy  UPDATE OCt 29th 2015:  She  continued to complains of bilateral feet pain, numbness, getting worse after bearing weight, she is taking up to 2400 mg of gabapentin each day, still has difficulty sleeping because her feet wake her up every couple hours, can be an help some  Worsening gait difficulty, tendency to fall, no bowel or bladder incontinence,  UPDATE Dec 1st 2015: She came in earlier as expected, because continued to have bilateral feet deep achy pain, involving whole feet, difficulty sleeping because of her feet discomfort, and deep achy pain, she has to get up in the middle of the night, using hot water, sometimes ices to ease up the discomfort, she also complains of mild unsteady gait, bilateral fingertips paresthesia  EMG nerve conduction study in June 2014, showed no large fiber peripheral neuropathy, no evidence of lumbar radiculopathy   We have reviewed MRI lumbar in November 2015, 1. At L3-4: disc bulging and facet hypertrophy with moderate biforaminal stenosis  2. At L2-3: disc bulging and facet hypertrophy with mild biforaminal stenosis  3. Posterior decompression and interbody fusion from L4-L5-S1 with metal screws and adjoining hardware. She could not tolerate nortriptyline, complains of nausea,   REVIEW OF SYSTEMS: Full 14 system review of systems performed and notable only for those listed, all others are neg:  As above   ALLERGIES: Allergies  Allergen Reactions  . Other Other (See Comments)    According to patient Trilafor and Reglan cause Tardive Dyskinesia  . Metoclopramide Hcl Other (See Comments)    Shaking; caused tardive dyskinesia  . Niacin Other (See Comments)    shaking  .  Trovan [Alatrofloxacin Mesylate] Other (See Comments)    Caused shaking and nervousness  . Nortriptyline Other (See Comments)    Dizziness   . Benzocaine-Resorcinol Rash  . Celecoxib Other (See Comments)    unknown  . Glucosamine Other (See Comments)    unknown  . Phenazopyridine Hcl Other (See Comments)      unknown  . Sulfa Antibiotics Nausea And Vomiting and Rash  . Sulfonamide Derivatives Nausea And Vomiting and Rash    HOME MEDICATIONS: Outpatient Prescriptions Prior to Visit  Medication Sig Dispense Refill  . acetaminophen (TYLENOL) 325 MG tablet Take 650 mg by mouth every 6 (six) hours as needed for mild pain, moderate pain or fever.    Marland Kitchen aluminum & magnesium hydroxide-simethicone (MYLANTA) 500-450-40 MG/5ML suspension Take 5 mLs by mouth every 6 (six) hours as needed for indigestion.    Marland Kitchen aspirin 81 MG tablet Take 81 mg by mouth at bedtime.     Marland Kitchen atenolol (TENORMIN) 25 MG tablet Take 25 mg by mouth every morning.     . carboxymethylcellulose (REFRESH TEARS) 0.5 % SOLN Place 1 drop into both eyes 2 (two) times daily.     . cholecalciferol (VITAMIN D) 1000 UNITS tablet Take 1,000 Units by mouth daily.     Marland Kitchen docusate sodium (COLACE) 100 MG capsule Take 200 mg by mouth at bedtime.    Marland Kitchen FLUoxetine (PROZAC) 20 MG capsule Take 20 mg by mouth daily.    Marland Kitchen gabapentin (NEURONTIN) 300 MG capsule 2 tabs qid 250 capsule 11  . hydrOXYzine (VISTARIL) 25 MG capsule Take 25 mg by mouth at bedtime.    . lactose free nutrition (BOOST) LIQD Take 237 mLs by mouth daily.    Marland Kitchen levothyroxine (SYNTHROID, LEVOTHROID) 175 MCG tablet Take 1 tablet (175 mcg total) by mouth daily before breakfast. 90 tablet 1  . levothyroxine (SYNTHROID, LEVOTHROID) 175 MCG tablet Take 1 tablet (175 mcg total) by mouth daily before breakfast. 90 tablet 0  . lipase/protease/amylase (CREON) 12000 UNITS CPEP capsule Take 2 capsules (24,000 Units total) by mouth 3 (three) times daily before meals. 270 capsule 0  . loratadine (CLARITIN) 10 MG tablet Take 10 mg by mouth daily.    Marland Kitchen LORazepam (ATIVAN) 0.5 MG tablet Take 1 tablet (0.5 mg total) by mouth 3 (three) times daily. 30 tablet 2  . lovastatin (MEVACOR) 40 MG tablet Take 40 mg by mouth at bedtime.    . Multiple Vitamin (MULTIVITAMIN WITH MINERALS) TABS tablet Take 1 tablet by mouth  daily.    Marland Kitchen omeprazole (PRILOSEC OTC) 20 MG tablet Take 20 mg by mouth 2 (two) times daily.     . polycarbophil (FIBERCON) 625 MG tablet Take 1,250 mg by mouth at bedtime.    . topiramate (TOPAMAX) 200 MG tablet Take 1 tablet (200 mg total) by mouth 2 (two) times daily. 90 tablet 0  . UNABLE TO FIND Apply 1 application topically 4 (four) times daily. Doxep/Amanta/DXM/Lidocaine 25 mg for her feet    . vitamin E 400 UNIT capsule Take 800 Units by mouth 2 (two) times daily. For tartive dyskinesia    . zolpidem (AMBIEN) 10 MG tablet Take 1 tablet (10 mg total) by mouth at bedtime as needed for sleep. 1/2 to 1 tab at bedtime 30 tablet 1  . nortriptyline (PAMELOR) 25 MG capsule One po qhs xone week, then 2 tabs po qhs 60 capsule 11   No facility-administered medications prior to visit.    PAST MEDICAL HISTORY: Past Medical History  Diagnosis Date  . Osteoarthritis   . Hiatal hernia   . Chronic maxillary sinusitis     neti pot  . Depression     alone a lot  . Eustachian tube dysfunction   . Tardive dyskinesia     possibly reglan, vitamin E helps  . Mitral valve prolapse     antibiotics before dental procedures  . GERD (gastroesophageal reflux disease)   . Allergic rhinitis   . Glaucoma   . Hypothyroid   . Hypertension   . Heart murmur     hx. "MVP" -predental antibiotics  . Memory loss     short term memory loss  . Urgency of urination     some UTI in past  . Stroke     mini storkes left leg paraylsis. patient denies weakness 01/08/14.   . Cancer 07/10/13    Pancreatic cancer with MRI scan 06-19-13    PAST SURGICAL HISTORY: Past Surgical History  Procedure Laterality Date  . Knee surgery Left     x 5, total knee Left knee  . 1 baker cyst removed    . Dilation and curettage of uterus      x3  . Lumbar spine surgery      x2  . Abdominal hysterectomy      including ovaries  . Lumbar spine surgery      cyst  . Joint replacement      LTKA  . Blepharoplasty Bilateral      with cataract surgery  . Eye surgery Right     cataract  . Eus N/A 07/10/2013    Procedure: ESOPHAGEAL ENDOSCOPIC ULTRASOUND (EUS) RADIAL;  Surgeon: Arta Silence, MD;  Location: [facility];  Service: Endoscopy;  Laterality: N/A;  . Fine needle aspiration N/A 07/10/2013    Procedure: FINE NEEDLE ASPIRATION (FNA) LINEAR;  Surgeon: Arta Silence, MD;  Location: [facility];  Service: Endoscopy;  Laterality: N/A;  possible fna  . Back surgery      fusion  . Breast surgery      Biopsy left 2 times  . Colonoscopy w/ polypectomy      2004 last colonoscopy, no polyps  . Laparoscopy N/A 08/07/2013    Procedure: LAPAROSCOPY DIAGNOSTIC;  Surgeon: [name], MD;  Location: [facility];  Service: General;  Laterality: N/A;  . Whipple procedure N/A 08/07/2013    Procedure: WHIPPLE PROCEDURE;  Surgeon: [name], MD;  Location: [facility];  Service: General;  Laterality: N/A;  . Esophagogastroduodenoscopy N/A 09/11/2013    Procedure: ESOPHAGOGASTRODUODENOSCOPY (EGD);  Surgeon: Cleotis Nipper, MD;  Location: [facility];  Service: Endoscopy;  Laterality: N/A;  Moderate sedation okay if MAC not available    FAMILY HISTORY: Family History  Problem Relation Age of Onset  . Cancer Mother     Breast Cancer with Metastatic disease  . Alzheimer's disease Father   . Cancer Maternal Uncle     prostate    SOCIAL HISTORY: History   Social History  . Marital Status: Widowed    Spouse Name: N/A    Number of Children: 2  . Years of Education: 8   Occupational History  .    Marland Kitchen Retired    Social History Main Topics  . Smoking status: Never Smoker   . Smokeless tobacco: Never Used  . Alcohol Use: No  . Drug Use: No  . Sexual Activity: Not Currently   Other Topics Concern  . 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 Generalized: Well developed,  in no acute distress  Head: normocephalic and atraumatic,. Oropharynx benign  Neck: Supple, no carotid bruits ,  Neurological examination  Mentation: Alert oriented to time, place, history taking. Follows all commands speech and language fluent. Mini-Mental Status Examination is 30 out of 30  Cranial nerve II-XII: .Pupils were equal round reactive to light extraocular movements were full, visual field were full on confrontational test. Facial sensation and strength were normal. hearing was intact to finger rubbing bilaterally. Uvula tongue midline. head turning and shoulder shrug were normal and symmetric.Tongue protrusion into cheek strength was normal.  Motor: normal bulk and tone, full strength in the BUE, BLE, No focal weakness, tenderness of bilateral feet upon deep palpation  Sensory: Length dependent decreased fine touch, pinprick to mid shin level bilaterally, decreased vibratory to toes, preserved proprioception  Coordination: finger-nose-finger, heel-to-shin bilaterally, no dysmetria  Reflexes: Brachioradialis 2/2, biceps 2/2, triceps 2/2, patellar 2/2, Achilles trace, plantar responses were flexor bilaterally.  Gait and Station: Rising up from seated position without assistance, cautious, narrow based, was able to stand on tiptoe, and heels,    DIAGNOSTIC DATA (LABS, IMAGING, TESTING) - I reviewed patient records, labs, notes, testing and imaging myself where available.  Lab Results  Component Value Date   WBC 8.4 02/15/2014   HGB 14.8  02/15/2014   HCT 44.4 02/15/2014   MCV 97.4 02/15/2014   PLT 153 02/15/2014      Component Value Date/Time   NA 143 02/15/2014 1445   K 4.2 02/15/2014 1445   CL 105 02/15/2014 1445   CO2 24 02/15/2014 1445   GLUCOSE 99 02/15/2014 1445   BUN 13 02/15/2014 1445   CREATININE 0.83 02/15/2014 1445   CREATININE 0.81 07/01/2013 1324   CALCIUM 9.1 02/15/2014 1445   PROT 7.1 02/15/2014 1445   ALBUMIN 3.9 02/15/2014 1445   AST 42* 02/15/2014 1445   ALT 38* 02/15/2014 1445   ALKPHOS 142* 02/15/2014 1445   BILITOT 0.4 02/15/2014 1445   GFRNONAA 68* 02/15/2014 1445   GFRAA 79* 02/15/2014 1445    Lab Results  Component Value Date   HGBA1C 5.0 11/28/2013   Lab Results  Component Value Date   VITAMINB12 >1500* 10/29/2013   Lab Results  Component Value Date   TSH 1.50 11/27/2013      ASSESSMENT AND PLAN  73 y.o. year old female  has a past medical history of bilateral foot pain paresthesias for greater than 10 years . 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 Previous electrodiagnostic study was normal, there was no evidence of large fiber peripheral neuropathy, she is now complaining of worsening low back pain, previous lumbar decompression surgery, pancreatic cancer, status  post Whipple procedure in March 2015,  1, bilateral feet paresthesia, differentiation diagnosis including small fiber neuropathy,  there was no evidence of large fiber peripheral neuropathy, lumbar radiculopathy, 2. She is on polypharmacy treatment already because of her depression anxiety, this including Topamax 200 mg twice a day, gabapentin 2400 mg daily, Ativan 0.5 milligrams 3 times a day, Ambien, 10 mg every night 3. Her symptoms somewhat suggestive of restless leg, will proceed with Requip 0.5 milligram, titrating to 2 tablets every night 4. Return to clinic in one month, for skin biopsy      [facility] [address] [place], [place] [address] [phone]

## 2014-05-07 ENCOUNTER — Telehealth: Payer: Self-pay | Admitting: Neurology

## 2014-05-07 ENCOUNTER — Telehealth: Payer: Self-pay | Admitting: Internal Medicine

## 2014-05-07 NOTE — Telephone Encounter (Signed)
Needs face to face signed, dated, and medical condition noted.  Faxed over on 11/3 and 12/2.  Will refax again today.  Needs back as soon as possible.

## 2014-05-07 NOTE — Telephone Encounter (Signed)
I have called Tamecka, his right big toe turns black, I have suggested her to contact her primary care physician Dr. Yong Channel

## 2014-05-07 NOTE — Telephone Encounter (Signed)
Patient states that her right foot looks like its turing black both of her feet are both so numb. Patient states she does not know if she has hit her foot or what .

## 2014-05-07 NOTE — Telephone Encounter (Signed)
Pt is calling stating that her feet are burning and stinging.  She states that her toe on the right foot is turning black.  She states it looks like she hit it, but she has not.  Please call and advise.

## 2014-05-07 NOTE — Telephone Encounter (Signed)
Notified Jane

## 2014-05-07 NOTE — Telephone Encounter (Signed)
She sees Dr Yong Channel @ [facility]

## 2014-05-08 ENCOUNTER — Telehealth: Payer: Self-pay | Admitting: Family Medicine

## 2014-05-08 NOTE — Telephone Encounter (Signed)
PLEASE NOTE: All timestamps contained within this report are represented as [place] Standard Time. CONFIDENTIALTY NOTICE: This fax transmission is intended only for the addressee. It contains information that is legally privileged, confidential or otherwise protected from use or disclosure. If you are not the intended recipient, you are strictly prohibited from reviewing, disclosing, copying using or disseminating any of this information or taking any action in reliance on or regarding this information. If you have received this fax in error, please notify us immediately by telephone so that we can arrange for its return to [place]. Phone: [phone], Toll-Free: [phone], Fax: [phone] Page: 1 of 2 Call Id: [id number] [facility] - Client [place] Patient Name: [name] Gender: Female DOB: 10-29-1940 Age: 73 Y 97 M 9 D Return Phone Number: [phone] (Primary) Address: City/State/Zip: [place] Client [facility] - Client [facility] - Day Physician [name] Contact Type Call Call Type Triage / Clinical Relationship To Patient Self Return Phone Number [phone] (Primary) Chief Complaint Numbness Initial Comment Caller states she spoke with the doctor yesterday. She is having problems with her feet being numb and burning. Toe swelled. PreDisposition Did not know what to do Nurse Assessment Nurse: Mallie Mussel, RN, Alveta Heimlich Date/Time Eilene Ghazi Time): 05/08/2014 11:29:47 AM Confirm and document reason for call. If symptomatic, describe symptoms. ---Caller states she spoke with the foot doctor yesterday. She is having problems with her feet being numb and burning. Toe swelled. The right 2nd toe is swelling and top of it is turning purple. She was advised she needed to see her primary doctor about this. She has numbness, burning and stinging in her feet and toes.  She is scheduled on 06/09/14 for a skin biopsy. She rates her pain as 10 on 0-10 scale. She is taking Gabapentin for the pain. She is not a diabetic. This helps some with her pain. Has the patient traveled out of the country within the last 30 days? ---No Does the patient require triage? ---Yes Related visit to physician within the last 2 weeks? ---Yes Does the PT have any chronic conditions? (i.e. diabetes, asthma, etc.) ---Yes List chronic conditions. ---Neuropathy, [place] Guidelines Guideline Title Affirmed Question Affirmed Notes Nurse Date/Time Eilene Ghazi Time) Foot Pain Purple or black skin on foot or toe She now says the color is a pinkish purple, but it is dark in color. Mallie Mussel, RN, Alveta Heimlich 05/08/2014 11:36:24 AM Disp. Time Eilene Ghazi Time) Disposition Final User 05/08/2014 11:39:49 AM Go to ED Now Yes Mallie Mussel, RN, Carman Ching NOTE: All timestamps contained within this report are represented as [place] Standard Time. CONFIDENTIALTY NOTICE: This fax transmission is intended only for the addressee. It contains information that is legally privileged, confidential or otherwise protected from use or disclosure. If you are not the intended recipient, you are strictly prohibited from reviewing, disclosing, copying using or disseminating any of this information or taking any action in reliance on or regarding this information. If you have received this fax in error, please notify us immediately by telephone so that we can arrange for its return to [place]. Phone: [phone], Toll-Free: [phone], Fax: [phone] Page: 2 of 2 Call Id: [id number] Caller Understands: Yes Disagree/Comply: Comply Care Advice Given Per Guideline GO TO ED NOW: You need to be seen in the Emergency Department. Go to the ER at ___________ [place] now. Drive carefully. After Care Instructions Given Call Event Type User Date / Time Description Comments User: Alveta Heimlich,  Mallie Mussel, RN Date/Time Eilene Ghazi Time): 05/08/2014  11:43:49 AM 1140- Tanya who transferred me to Seth Bake who states that she will run this by Dr. Yong Channel and call the patient back. Caller notified. Verbalized understanding. Referrals REFERRED TO PCP OFFICE

## 2014-05-08 NOTE — Telephone Encounter (Signed)
Per Dr. Yong Channel called and spoke with pt.  Pt states her pain is still a 10 out of 10.  Pt denies fever or worsening symptoms.  Per Dr. Yong Channel pt can be seen on 12.11.15 at 9:15 am.  Pt advised to seek medical attention if she has worsening symptoms or new symptoms such as fever and spreading of swelling or discoloration.  Pt verbalized understanding.

## 2014-05-09 ENCOUNTER — Ambulatory Visit (INDEPENDENT_AMBULATORY_CARE_PROVIDER_SITE_OTHER)
Admission: RE | Admit: 2014-05-09 | Discharge: 2014-05-09 | Disposition: A | Discharge status: Home / Self Care | Payer: Medicare Other | Source: Ambulatory Visit | Attending: Family Medicine | Admitting: Family Medicine

## 2014-05-09 ENCOUNTER — Encounter: Payer: Self-pay | Admitting: Family Medicine

## 2014-05-09 ENCOUNTER — Ambulatory Visit (INDEPENDENT_AMBULATORY_CARE_PROVIDER_SITE_OTHER): Payer: Medicare Other | Admitting: Family Medicine

## 2014-05-09 VITALS — BP 120/70 | HR 82 | Temp 98.3°F | Wt 147.0 lb

## 2014-05-09 DIAGNOSIS — L03031 Cellulitis of right toe: Secondary | ICD-10-CM

## 2014-05-09 DIAGNOSIS — M79674 Pain in right toe(s): Secondary | ICD-10-CM | POA: Diagnosis not present

## 2014-05-09 DIAGNOSIS — M79671 Pain in right foot: Secondary | ICD-10-CM | POA: Diagnosis not present

## 2014-05-09 DIAGNOSIS — M205X1 Other deformities of toe(s) (acquired), right foot: Secondary | ICD-10-CM | POA: Diagnosis not present

## 2014-05-09 DIAGNOSIS — M7989 Other specified soft tissue disorders: Secondary | ICD-10-CM | POA: Diagnosis not present

## 2014-05-09 DIAGNOSIS — M19071 Primary osteoarthritis, right ankle and foot: Secondary | ICD-10-CM | POA: Diagnosis not present

## 2014-05-09 MED ORDER — DOXYCYCLINE HYCLATE 100 MG PO TABS: 100.0000 mg | ORAL_TABLET | Freq: Two times a day (BID) | ORAL | Status: DC

## 2014-05-09 NOTE — Progress Notes (Signed)
[name], MD Phone: [phone]  Subjective:   [name] is a 73 y.o. year old very pleasant female patient who presents with the following:  Right 2nd toe pain/redness/pain -x 1 week. Started with pain around MTP then migrated ddown foot then started with erythema, swelling in toe. Warm to touch. Pain with toe motion. Baseline neuropathy pain 7/10 worse in 2nd toe on right foot up to 8/10. Described as pins and needles. Mild worsening. She had described her toe as turning black by phone to neurology who suggested primary care follow up. Nothing else makes this better or worse-color or pain compared to her baseline.  ROS- denies fevers/chills/nausea/vomiting  Past Medical History- Patient Active Problem List   Diagnosis Date Noted  . Exocrine pancreatic insufficiency 12/30/2013    Priority: High  . Carcinoma of head of pancreas 07/01/2013    Priority: High  . MITRAL VALVE PROLAPSE 02/26/2007    Priority: High  . Memory loss 01/08/2014    Priority: Medium  . Other abnormal glucose 11/29/2013    Priority: Medium  . 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 Paresthesia of foot 06/06/2012    Priority: Medium  . Syncope 09/26/2011    Priority: Medium  . HYPOTHYROIDISM 02/26/2007    Priority: Medium  . HYPERLIPIDEMIA 02/26/2007    Priority: Medium  . ANXIETY 02/26/2007    Priority: Medium  . DEPRESSION 02/26/2007    Priority: Medium  . HYPERTENSION 02/26/2007    Priority: Medium  . RHINOSINUSITIS, CHRONIC 02/03/2009    Priority: Low  . COLONIC POLYPS 06/05/2008    Priority: Low  . OSTEOARTHRITIS 06/04/2008    Priority: Low  . DIARRHEA, CHRONIC 04/22/2008    Priority: Low  . BACK PAIN 08/03/2007    Priority: Low  . Chronic maxillary sinusitis 04/20/2007    Priority: Low  . TARDIVE DYSKINESIA 02/26/2007    Priority: Low  . GLAUCOMA 02/26/2007    Priority: Low  . 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 ALLERGIC RHINITIS 02/26/2007    Priority: Low  . GERD 02/26/2007    Priority: Low  . HIATAL HERNIA 02/26/2007    Priority: Low  . OSTEOPOROSIS 02/26/2007    Priority: Low  . Paresthesia 04/29/2014  . Low back pain 03/27/2014  . Pain in both feet 03/27/2014  . Abnormality of gait 03/27/2014   Medications- reviewed and updated Current Outpatient Prescriptions  Medication Sig Dispense Refill  . aspirin 81 MG tablet Take 81 mg by mouth at bedtime.     Marland Kitchen atenolol (TENORMIN) 25 MG tablet Take 25 mg by mouth every morning.     . carboxymethylcellulose (REFRESH TEARS) 0.5 % SOLN Place 1 drop into both eyes 2 (two) times daily.     . cholecalciferol (VITAMIN D) 1000 UNITS tablet Take 1,000 Units by mouth daily.     Marland Kitchen docusate sodium (COLACE) 100 MG capsule Take 200 mg by mouth at bedtime.    Marland Kitchen FLUoxetine (PROZAC) 20 MG capsule Take 20 mg by mouth daily.    Marland Kitchen gabapentin (NEURONTIN) 300 MG capsule 2 tabs qid 250 capsule 11  . hydrOXYzine (VISTARIL) 25 MG capsule Take 25 mg by mouth at bedtime.    . lactose free nutrition (BOOST) LIQD Take 237 mLs by mouth daily.    Marland Kitchen levothyroxine (SYNTHROID, LEVOTHROID) 175 MCG tablet Take 1 tablet (175 mcg total) by mouth daily before breakfast. 90 tablet 1  . levothyroxine (  SYNTHROID, LEVOTHROID) 175 MCG tablet Take 1 tablet (175 mcg total) by mouth daily before breakfast. 90 tablet 0  . lipase/protease/amylase (CREON) 12000 UNITS CPEP capsule Take 2 capsules (24,000 Units total) by mouth 3 (three) times daily before meals. 270 capsule 0  . loratadine (CLARITIN) 10 MG tablet Take 10 mg by mouth daily.    Marland Kitchen LORazepam (ATIVAN) 0.5 MG tablet Take 1 tablet (0.5 mg total) by mouth 3 (three) times daily. 30 tablet 2  . lovastatin (MEVACOR) 40 MG tablet Take 40 mg by mouth at bedtime.    . Multiple Vitamin (MULTIVITAMIN WITH MINERALS) TABS tablet Take 1 tablet by mouth daily.    Marland Kitchen omeprazole (PRILOSEC OTC) 20 MG tablet Take 20 mg by mouth 2 (two) times daily.      . polycarbophil (FIBERCON) 625 MG tablet Take 1,250 mg by mouth at bedtime.    Marland Kitchen rOPINIRole (REQUIP) 0.5 MG tablet One po qhs xone week, then 2 tabs po qhs 60 tablet 11  . topiramate (TOPAMAX) 200 MG tablet Take 1 tablet (200 mg total) by mouth 2 (two) times daily. 90 tablet 0  . UNABLE TO FIND Apply 1 application topically 4 (four) times daily. Doxep/Amanta/DXM/Lidocaine 25 mg for her feet    . vitamin E 400 UNIT capsule Take 800 Units by mouth 2 (two) times daily. For tartive dyskinesia    . acetaminophen (TYLENOL) 325 MG tablet Take 650 mg by mouth every 6 (six) hours as needed for mild pain, moderate pain or fever.    Marland Kitchen aluminum & magnesium hydroxide-simethicone (MYLANTA) 500-450-40 MG/5ML suspension Take 5 mLs by mouth every 6 (six) hours as needed for indigestion.    Marland Kitchen doxycycline (VIBRA-TABS) 100 MG tablet Take 1 tablet (100 mg total) by mouth 2 (two) times daily. 20 tablet 0   No current facility-administered medications for this visit.    Objective: BP 120/70 mmHg  Pulse 82  Temp(Src) 98.3 F (36.8 C)  Wt 147 lb (66.679 kg) Gen: NAD, resting comfortably CV: RRR no murmurs rubs or gallops, 2+ DP and PT pulses Lungs: CTAB no crackles, wheeze, rhonchi Abdomen: soft/nontender/nondistended/normal bowel sounds. Ext:  Good capillary refills in 2nd toe distally, some edema starting around MTP of 2nd toe through toes 2-4. Toe 2 on right foot is erythematous, swollen, moderately tender to touch. Patient has pain with flexing toe and also has pain with palpation up to MTP joint.  Skin: warm, dry, no rash other than on toe    Dg Foot Complete Right  05/09/2014   CLINICAL DATA:  Toe pain. Swelling. No known injury. Imaged evaluation.  EXAM: RIGHT FOOT COMPLETE - 3+ VIEW  COMPARISON:  None.  FINDINGS: Diffuse osteopenia degenerative change. No acute bony or joint abnormality.  IMPRESSION: Diffuse osteopenia degenerative change.  No acute abnormality.   Electronically Signed   By: Marcello Moores   Register   On: 05/09/2014 16:09   Dg Toe 2nd Right  05/09/2014   CLINICAL DATA:  Right foot and second toe pain.  EXAM: RIGHT SECOND TOE  COMPARISON:  Right foot 05/09/2014  FINDINGS: Three views of the right second toe were obtained. There is an angulation deformity of the second toe middle phalanx along the distal aspect. Findings are more suggestive for an old injury rather than an acute injury. Irregularity of the third toe middle phalanx and DIP joint.  IMPRESSION: Angulation deformity of the second toe middle phalanx. Findings are suggestive for an injury of unknown age. Burtis Junes an older injury.  Cortical  irregularity of the third toe middle phalanx and irregularity of the third toe DIP joint. Findings could be posttraumatic and the chronicity is unknown.   Electronically Signed   By: Markus Daft M.D.   On: 05/09/2014 16:14   Assessment/Plan:  Right 2nd toe pain/redness/pain Appears to be cellulitis with appearance, pain, and warmth to touch. Patient at risk for this with known neuropathy. Treat with 10 days doxycycline. X-rays obtained and there does appear to be some old bony injury both at toe 2 and 3. This could certainly be an acute fracture as well. Consider buddy taping on follow up early next week (if swelling has improved). Strict Return precautions advised and reasons to go to ER discussed.  Patient also has some memory loss and history of pancreatic cancer and would benefit from home health services. Face to face filled out at this time.   Orders Placed This Encounter  Procedures  . DG Toe 2nd Right    Standing Status: Future     Number of Occurrences: 1     Standing Expiration Date: 07/11/2015    Order Specific Question:  Reason for Exam (SYMPTOM  OR DIAGNOSIS REQUIRED)    Answer:  2nd toe pain, suspect cellulitis but with history of neuropathy want to evaluate for an underlying fracture    Order Specific Question:  Preferred imaging location?    Answer:  [facility]  . DG  Foot Complete Right    Standing Status: Future     Number of Occurrences: 1     Standing Expiration Date: 07/11/2015    Order Specific Question:  Reason for Exam (SYMPTOM  OR DIAGNOSIS REQUIRED)    Answer:  2nd toe pain, suspect cellulitis but with history of neuropathy want to evaluate for an underlying fracture    Order Specific Question:  Preferred imaging location?    Answer:  [facility]    Meds ordered this encounter  Medications  . doxycycline (VIBRA-TABS) 100 MG tablet    Sig: Take 1 tablet (100 mg total) by mouth 2 (two) times daily.    Dispense:  20 tablet    Refill:  0

## 2014-05-09 NOTE — Patient Instructions (Addendum)
I am worried about a skin infection around the toe.  Want to rule out a fracture with x-ray- go to [facility] for this Start doxycycline and take twice a day for 10 days.   See me back on Monday for repeat evaluation.  You need to go to the emergency room if you have worsening pain, the toe changes colors such as to black/blue or if redness expands up the foot.

## 2014-05-14 ENCOUNTER — Telehealth: Payer: Self-pay | Admitting: Family Medicine

## 2014-05-14 ENCOUNTER — Encounter: Payer: Self-pay | Admitting: Family Medicine

## 2014-05-14 ENCOUNTER — Ambulatory Visit (INDEPENDENT_AMBULATORY_CARE_PROVIDER_SITE_OTHER): Payer: Medicare Other | Admitting: Family Medicine

## 2014-05-14 VITALS — BP 110/70 | Temp 98.2°F | Wt 147.0 lb

## 2014-05-14 DIAGNOSIS — M79674 Pain in right toe(s): Secondary | ICD-10-CM

## 2014-05-14 DIAGNOSIS — L03031 Cellulitis of right toe: Secondary | ICD-10-CM

## 2014-05-14 DIAGNOSIS — S92911A Unspecified fracture of right toe(s), initial encounter for closed fracture: Secondary | ICD-10-CM

## 2014-05-14 NOTE — Progress Notes (Signed)
[name], MD Phone: [phone]  Subjective:   [name] is a 73 y.o. year old very pleasant female patient who presents with the following:  Right 2nd toe pain/redness/pain thought to be cellulitis but complicated by unknown age fracture in 2nd toe and 3rd toe with cortical irregularity Patient seen 5 days ago for right 2nd toe pain/redness. This was thought to be cellulitis and she was treated with doxycycline. Patient with no history of trauma (does have some memory loss though so may not be best historian plus has neuropathy) but decision made to x-ray to evaluate for fracture. She had both a 2nd and 3rd toe cortical irregularly with 2nd toe with age unknown but likely old injury angulation deformity and cortical changes in toe 3 with unknown chronicity. Patient mentions having surgery on the 3rd toe at some point in her life though she is not sure what for.   Patient had up to 8/10 pain up from baseline 7/10 neuropathy pain in her feet. Today, she reports her pain is similar to the rest of her foot, her toe is less swollen and less red. She feels that she is getting better.   ROS- continues to deny fevers/chills/nausea/vomiting  Past Medical History- Patient Active Problem List   Diagnosis Date Noted  . Exocrine pancreatic insufficiency 12/30/2013    Priority: High  . Carcinoma of head of pancreas 07/01/2013    Priority: High  . MITRAL VALVE PROLAPSE 02/26/2007    Priority: High  . Memory loss 01/08/2014    Priority: Medium  . Other abnormal glucose 11/29/2013    Priority: Medium  . 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 Paresthesia of foot 06/06/2012    Priority: Medium  . Syncope 09/26/2011    Priority: Medium  . HYPOTHYROIDISM 02/26/2007    Priority: Medium  . HYPERLIPIDEMIA 02/26/2007    Priority: Medium  . ANXIETY 02/26/2007    Priority: Medium  . DEPRESSION 02/26/2007    Priority: Medium  . HYPERTENSION  02/26/2007    Priority: Medium  . Abnormality of gait 03/27/2014    Priority: Low  . RHINOSINUSITIS, CHRONIC 02/03/2009    Priority: Low  . COLONIC POLYPS 06/05/2008    Priority: Low  . OSTEOARTHRITIS 06/04/2008    Priority: Low  . DIARRHEA, CHRONIC 04/22/2008    Priority: Low  . Low back pain 08/03/2007    Priority: Low  . Chronic maxillary sinusitis 04/20/2007    Priority: Low  . TARDIVE DYSKINESIA 02/26/2007    Priority: Low  . GLAUCOMA 02/26/2007    Priority: Low  . EUSTACHIAN TUBE DYSFUNCTION 02/26/2007    Priority: Low  . ALLERGIC RHINITIS 02/26/2007    Priority: Low  . GERD 02/26/2007    Priority: Low  . HIATAL HERNIA 02/26/2007    Priority: Low  . OSTEOPOROSIS 02/26/2007    Priority: Low   Medications- reviewed and updated Current Outpatient Prescriptions  Medication Sig Dispense Refill  . aluminum & magnesium hydroxide-simethicone (MYLANTA) 500-450-40 MG/5ML suspension Take 5 mLs by mouth every 6 (six) hours as needed for indigestion.    Marland Kitchen aspirin 81 MG tablet Take 81 mg by mouth at bedtime.     Marland Kitchen atenolol (TENORMIN) 25 MG tablet Take 25 mg by mouth every morning.     . carboxymethylcellulose (REFRESH TEARS) 0.5 % SOLN Place 1 drop into both eyes 2 (two) times daily.     . cholecalciferol (VITAMIN D) 1000 UNITS tablet Take 1,000 Units by  mouth daily.     Marland Kitchen docusate sodium (COLACE) 100 MG capsule Take 200 mg by mouth at bedtime.    Marland Kitchen doxycycline (VIBRA-TABS) 100 MG tablet Take 1 tablet (100 mg total) by mouth 2 (two) times daily. 20 tablet 0  . FLUoxetine (PROZAC) 20 MG capsule Take 20 mg by mouth daily.    Marland Kitchen gabapentin (NEURONTIN) 300 MG capsule 2 tabs qid 250 capsule 11  . hydrOXYzine (VISTARIL) 25 MG capsule Take 25 mg by mouth at bedtime.    . lactose free nutrition (BOOST) LIQD Take 237 mLs by mouth daily.    Marland Kitchen levothyroxine (SYNTHROID, LEVOTHROID) 175 MCG tablet Take 1 tablet (175 mcg total) by mouth daily before breakfast. 90 tablet 1  .  lipase/protease/amylase (CREON) 12000 UNITS CPEP capsule Take 2 capsules (24,000 Units total) by mouth 3 (three) times daily before meals. 270 capsule 0  . loratadine (CLARITIN) 10 MG tablet Take 10 mg by mouth daily.    Marland Kitchen LORazepam (ATIVAN) 0.5 MG tablet Take 1 tablet (0.5 mg total) by mouth 3 (three) times daily. 30 tablet 2  . lovastatin (MEVACOR) 40 MG tablet Take 40 mg by mouth at bedtime.    . Multiple Vitamin (MULTIVITAMIN WITH MINERALS) TABS tablet Take 1 tablet by mouth daily.    Marland Kitchen omeprazole (PRILOSEC OTC) 20 MG tablet Take 20 mg by mouth 2 (two) times daily.     . polycarbophil (FIBERCON) 625 MG tablet Take 1,250 mg by mouth at bedtime.    Marland Kitchen rOPINIRole (REQUIP) 0.5 MG tablet One po qhs xone week, then 2 tabs po qhs 60 tablet 11  . topiramate (TOPAMAX) 200 MG tablet Take 1 tablet (200 mg total) by mouth 2 (two) times daily. 90 tablet 0  . UNABLE TO FIND Apply 1 application topically 4 (four) times daily. Doxep/Amanta/DXM/Lidocaine 25 mg for her feet    . vitamin E 400 UNIT capsule Take 800 Units by mouth 2 (two) times daily. For tartive dyskinesia    . acetaminophen (TYLENOL) 325 MG tablet Take 650 mg by mouth every 6 (six) hours as needed for mild pain, moderate pain or fever.     No current facility-administered medications for this visit.    Objective: BP 110/70 mmHg  Temp(Src) 98.2 F (36.8 C)  Wt 147 lb (66.679 kg) Gen: NAD, resting comfortably CV: RRR no murmurs rubs or gallops, 2+ DP and PT pulses Ext:  Good capillary refills in 2nd toe distally, improved edema and no longer with warmth. 1st photo below is from last week, and bottom photo is today. Still moderately tender to touch. Patient has pain with flexing toe and also has pain with palpation up to MTP joint but both are improved from last week Skin: warm, dry, no rash other than on toe   05/09/14   05/15/15  From last visit:  Dg Foot Complete Right  05/09/2014   CLINICAL DATA:  Toe pain. Swelling. No known  injury. Imaged evaluation.  EXAM: RIGHT FOOT COMPLETE - 3+ VIEW  COMPARISON:  None.  FINDINGS: Diffuse osteopenia degenerative change. No acute bony or joint abnormality.  IMPRESSION: Diffuse osteopenia degenerative change.  No acute abnormality.   Electronically Signed   By: Marcello Moores  Register   On: 05/09/2014 16:09   Dg Toe 2nd Right  05/09/2014   CLINICAL DATA:  Right foot and second toe pain.  EXAM: RIGHT SECOND TOE  COMPARISON:  Right foot 05/09/2014  FINDINGS: Three views of the right second toe were obtained. There is  an angulation deformity of the second toe middle phalanx along the distal aspect. Findings are more suggestive for an old injury rather than an acute injury. Irregularity of the third toe middle phalanx and DIP joint.  IMPRESSION: Angulation deformity of the second toe middle phalanx. Findings are suggestive for an injury of unknown age. Burtis Junes an older injury.  Cortical irregularity of the third toe middle phalanx and irregularity of the third toe DIP joint. Findings could be posttraumatic and the chronicity is unknown.   Electronically Signed   By: Markus Daft M.D.   On: 05/09/2014 16:14   Assessment/Plan:  Right 2nd toe pain/redness/pain I still think there is an element of cellulitis at play here which seemed to improve on doxycycline. The cortical irregularities in toes 2 and 3 are concerning but it is unclear that this is the cause of patient pain and swelling in her 2nd toe. Instead of buddy taping given 2 toes injured, will place in post op shoe and follow up in 2 weeks. Finish doxycycline. I strongly doubt something like osteomyelitis but would have to consider MRI if does not continue to improve. The angulation deformity in toe 2 also makes me consider orthopedic referral or sports medicine evaluation. Toe 3 may have had a prior surgery per patient. Patient given strict return precautions and specifically reasons to go to the ER or seek care with Dr. Lorin Mercy her orthopedist.

## 2014-05-14 NOTE — Telephone Encounter (Signed)
appt scheduled  Pt would like to know if you would get her into see dr Charlann Boxer, ortho at [place]?  Pt states you mentioned her seeing ortho dr. If not, dr Lorin Mercy did her knee and 2 back surgeries, ([employer]) and she would see him if its going be a while.

## 2014-05-14 NOTE — Telephone Encounter (Signed)
Is this ok? If so, what would her Dx be for the referral?

## 2014-05-14 NOTE — Addendum Note (Signed)
Addended by: Marin Olp on: 05/14/2014 08:39 PM   Modules accepted: Level of Service

## 2014-05-14 NOTE — Telephone Encounter (Signed)
I wanted to see her in 2 weeks and see how she was doing and make a decision at that time.  FYI but do not discuss with patient as may confuse her- I also am going to send Dr. Tamala Julian a message to see his thoughts.

## 2014-05-14 NOTE — Telephone Encounter (Signed)
Notes say to fu in 2 wks, do you want pt to make an appt in 2 wks, or if not better to make appt? pls advise

## 2014-05-14 NOTE — Telephone Encounter (Signed)
Yes make appt to f/u in 2wks

## 2014-05-14 NOTE — Patient Instructions (Signed)
Finish doxycycline  Due to fractures that we are unsure of the age-let's put you in a post op shoe to allow healing.   Check in with me in 2 weeks. If your pain, swelling, or redness worsens see me sooner

## 2014-05-15 ENCOUNTER — Telehealth: Payer: Self-pay | Admitting: Family Medicine

## 2014-05-15 DIAGNOSIS — F332 Major depressive disorder, recurrent severe without psychotic features: Secondary | ICD-10-CM | POA: Diagnosis not present

## 2014-05-15 NOTE — Telephone Encounter (Signed)
I asked Dr. Tamala Julian of Sports Medicine review the films and he states there is concern for potential osteomyelitis (bone infection).   [name], can you schedule a visit with [facility] for Monday or Tuesday of next week and fax them a copy of my last 2 notes as well as this phone message?  Then call patient and explain the above. Thanks.

## 2014-05-15 NOTE — Telephone Encounter (Signed)
noted 

## 2014-05-16 NOTE — Telephone Encounter (Signed)
appt made and pt notified, notes faxed.

## 2014-05-19 DIAGNOSIS — M79671 Pain in right foot: Secondary | ICD-10-CM | POA: Diagnosis not present

## 2014-05-25 ENCOUNTER — Telehealth (INDEPENDENT_AMBULATORY_CARE_PROVIDER_SITE_OTHER): Payer: Self-pay | Admitting: Surgery

## 2014-05-25 DIAGNOSIS — K8681 Exocrine pancreatic insufficiency: Secondary | ICD-10-CM

## 2014-05-25 MED ORDER — PANCRELIPASE (LIP-PROT-AMYL) 12000-38000 UNITS PO CPEP: 24000.0000 [IU] | ORAL_CAPSULE | Freq: Three times a day (TID) | ORAL | Status: DC

## 2014-05-25 NOTE — Telephone Encounter (Signed)
[name]  08-09-1940 [id number]  Patient Care Team: Marin Olp, MD as PCP - General (Family Medicine) Marcial Pacas, MD (Neurology)  This patient is a 73 y.o.female who calls today for surgical evaluation.   Reason for call: Refill of CREON  Pt about to run out of CREON over long holiday season.  Tried to get refilled last week w/o success - asked for more urgent refll.  E-Rx  #240 w 5 refills  Patient Active Problem List   Diagnosis Date Noted  . Abnormality of gait 03/27/2014  . Memory loss 01/08/2014  . 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 Other abnormal glucose 11/29/2013  . Carcinoma of head of pancreas 07/01/2013  . 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 Paresthesia of foot 06/06/2012  . Syncope 09/26/2011  . RHINOSINUSITIS, CHRONIC 02/03/2009  . COLONIC POLYPS 06/05/2008  . OSTEOARTHRITIS 06/04/2008  . DIARRHEA, CHRONIC 04/22/2008  . Low back pain 08/03/2007  . Chronic maxillary sinusitis 04/20/2007  . HYPOTHYROIDISM 02/26/2007  . HYPERLIPIDEMIA 02/26/2007  . ANXIETY 02/26/2007  . DEPRESSION 02/26/2007  . TARDIVE DYSKINESIA 02/26/2007  . GLAUCOMA 02/26/2007  . EUSTACHIAN TUBE DYSFUNCTION 02/26/2007  . HYPERTENSION 02/26/2007  . MITRAL VALVE PROLAPSE 02/26/2007  . ALLERGIC RHINITIS 02/26/2007  . GERD 02/26/2007  . HIATAL HERNIA 02/26/2007  . OSTEOPOROSIS 02/26/2007    Past Medical History  Diagnosis Date  . Osteoarthritis   . Hiatal hernia   . Chronic maxillary sinusitis     neti pot  . Depression     alone a lot  . Eustachian tube dysfunction   . Tardive dyskinesia     possibly reglan, vitamin E helps  . Mitral valve prolapse     antibiotics before dental procedures  . GERD (gastroesophageal reflux disease)   . Allergic rhinitis   . Glaucoma   . Hypothyroid   . Hypertension   . Heart murmur     hx. "MVP" -predental antibiotics  . Memory loss     short term memory loss  . Urgency of urination     some UTI  in past  . Stroke     mini storkes left leg paraylsis. patient denies weakness 01/08/14.   . Cancer 07/10/13    Pancreatic cancer with MRI scan 06-19-13    Past Surgical History  Procedure Laterality Date  . Knee surgery Left     x 5, total knee Left knee  . 1 baker cyst removed    . Dilation and curettage of uterus      x3  . Lumbar spine surgery      x2  . Abdominal hysterectomy      including ovaries  . Lumbar spine surgery      cyst  . Joint replacement      LTKA  . Blepharoplasty Bilateral     with cataract surgery  . Eye surgery Right     cataract  . Eus N/A 07/10/2013    Procedure: ESOPHAGEAL ENDOSCOPIC ULTRASOUND (EUS) RADIAL;  Surgeon: Arta Silence, MD;  Location: [facility];  Service: Endoscopy;  Laterality: N/A;  . Fine needle aspiration N/A 07/10/2013    Procedure: FINE NEEDLE ASPIRATION (FNA) LINEAR;  Surgeon: Arta Silence, MD;  Location: [facility];  Service: Endoscopy;  Laterality: N/A;  possible fna  . Back surgery      fusion  . Breast surgery      Biopsy left 2 times  . Colonoscopy w/ polypectomy      2004 last colonoscopy,  no polyps  . Laparoscopy N/A 08/07/2013    Procedure: LAPAROSCOPY DIAGNOSTIC;  Surgeon: [name], MD;  Location: [facility];  Service: General;  Laterality: N/A;  . Whipple procedure N/A 08/07/2013    Procedure: WHIPPLE PROCEDURE;  Surgeon: [name], MD;  Location: [facility];  Service: General;  Laterality: N/A;  . Esophagogastroduodenoscopy N/A 09/11/2013    Procedure: ESOPHAGOGASTRODUODENOSCOPY (EGD);  Surgeon: Cleotis Nipper, MD;  [facility];  Service: Endoscopy;  Laterality: N/A;  Moderate sedation okay if MAC not available    History   Social History  . Marital Status: Widowed    Spouse Name: N/A    Number of Children: 2  . Years of Education: 8   Occupational History  .    [employer] Retired    Social History Main Topics  . Smoking status: Never Smoker   . Smokeless tobacco: Never Used  . Alcohol Use: No  .  Drug Use: No  . Sexual Activity: Not Currently   Other Topics Concern  . 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 Cancer Mother     Breast Cancer with Metastatic disease  . Alzheimer's disease Father   . Cancer Maternal Uncle     prostate    Current Outpatient Prescriptions  Medication Sig Dispense Refill  . acetaminophen (TYLENOL) 325 MG tablet Take 650 mg by mouth every 6 (six) hours as needed for mild pain, moderate pain or fever.    Marland Kitchen aluminum & magnesium hydroxide-simethicone (MYLANTA) 500-450-40 MG/5ML suspension Take 5 mLs by mouth every 6 (six) hours as needed for indigestion.    Marland Kitchen aspirin 81 MG tablet Take 81 mg by mouth at bedtime.     Marland Kitchen atenolol (TENORMIN) 25 MG tablet Take 25 mg by mouth every morning.     . carboxymethylcellulose (REFRESH TEARS) 0.5 % SOLN Place 1 drop into both eyes 2 (two) times daily.     . cholecalciferol (VITAMIN D) 1000 UNITS tablet Take 1,000 Units by mouth daily.     Marland Kitchen docusate sodium (COLACE) 100 MG capsule Take 200 mg by mouth at bedtime.    Marland Kitchen doxycycline (VIBRA-TABS) 100 MG tablet Take 1 tablet (100 mg total) by mouth 2 (two) times daily. 20 tablet 0  . FLUoxetine (PROZAC) 20 MG capsule Take 20 mg by mouth daily.    Marland Kitchen gabapentin (NEURONTIN) 300 MG capsule 2 tabs qid 250 capsule 11  . hydrOXYzine (VISTARIL) 25 MG capsule Take 25 mg by mouth at bedtime.    . lactose free nutrition (BOOST) LIQD Take 237 mLs by mouth daily.    Marland Kitchen levothyroxine (SYNTHROID, LEVOTHROID) 175  MCG tablet Take 1 tablet (175 mcg total) by mouth daily before breakfast. 90 tablet 1  . lipase/protease/amylase (CREON) 12000 UNITS CPEP capsule Take 2 capsules (24,000 Units total) by mouth 3 (three) times daily before  meals. 270 capsule 5  . loratadine (CLARITIN) 10 MG tablet Take 10 mg by mouth daily.    Marland Kitchen LORazepam (ATIVAN) 0.5 MG tablet Take 1 tablet (0.5 mg total) by mouth 3 (three) times daily. 30 tablet 2  . lovastatin (MEVACOR) 40 MG tablet Take 40 mg by mouth at bedtime.    . Multiple Vitamin (MULTIVITAMIN WITH MINERALS) TABS tablet Take 1 tablet by mouth daily.    Marland Kitchen omeprazole (PRILOSEC OTC) 20 MG tablet Take 20 mg by mouth 2 (two) times daily.     . polycarbophil (FIBERCON) 625 MG tablet Take 1,250 mg by mouth at bedtime.    Marland Kitchen rOPINIRole (REQUIP) 0.5 MG tablet One po qhs xone week, then 2 tabs po qhs 60 tablet 11  . topiramate (TOPAMAX) 200 MG tablet Take 1 tablet (200 mg total) by mouth 2 (two) times daily. 90 tablet 0  . UNABLE TO FIND Apply 1 application topically 4 (four) times daily. Doxep/Amanta/DXM/Lidocaine 25 mg for her feet    . vitamin E 400 UNIT capsule Take 800 Units by mouth 2 (two) times daily. For tartive dyskinesia     No current facility-administered medications for this visit.     Allergies  Allergen Reactions  . Other Other (See Comments)    According to patient Trilafor and Reglan cause Tardive Dyskinesia  . Metoclopramide Hcl Other (See Comments)    Shaking; caused tardive dyskinesia  . Niacin Other (See Comments)    shaking  . Trovan [Alatrofloxacin Mesylate] Other (See Comments)    Caused shaking and nervousness  . Nortriptyline Other (See Comments)    Dizziness   . Benzocaine-Resorcinol Rash  . Celecoxib Other (See Comments)    unknown  . Glucosamine Other (See Comments)    unknown  . Phenazopyridine Hcl Other (See Comments)    unknown  . Sulfa Antibiotics Nausea And Vomiting and Rash  . Sulfonamide Derivatives Nausea And Vomiting and Rash     @VS @  Dg Foot Complete Right  05/09/2014   CLINICAL DATA:  Toe pain. Swelling. No known injury. Imaged evaluation.  EXAM: RIGHT FOOT COMPLETE - 3+ VIEW  COMPARISON:  None.  FINDINGS: Diffuse osteopenia degenerative change. No acute bony or joint abnormality.  IMPRESSION: Diffuse osteopenia degenerative change.  No acute abnormality.   Electronically Signed   By: Marcello Moores  Register   On: 05/09/2014 16:09   Dg Toe 2nd Right  05/09/2014   CLINICAL DATA:  Right foot and second toe pain.  EXAM: RIGHT SECOND TOE  COMPARISON:  Right foot 05/09/2014  FINDINGS: Three views of the right second toe were obtained. There is an angulation deformity of the second toe middle phalanx along the distal aspect. Findings are more suggestive for an old injury rather than an acute injury. Irregularity of the third toe middle phalanx and DIP joint.  IMPRESSION: Angulation deformity of the second toe middle phalanx. Findings are suggestive for an injury of unknown age. Burtis Junes an older injury.  Cortical irregularity of the third toe middle phalanx and irregularity of the third toe DIP joint. Findings could be posttraumatic and the chronicity is unknown.   Electronically Signed   By: Markus Daft M.D.   On: 05/09/2014 16:14    Note: This dictation was prepared with Dragon/digital dictation along with [employer]. Any transcriptional errors that result from this process are unintentional.

## 2014-05-30 ENCOUNTER — Other Ambulatory Visit: Payer: Self-pay | Admitting: Family Medicine

## 2014-06-02 ENCOUNTER — Encounter: Payer: Self-pay | Admitting: Family Medicine

## 2014-06-02 ENCOUNTER — Ambulatory Visit (INDEPENDENT_AMBULATORY_CARE_PROVIDER_SITE_OTHER): Payer: Medicare Other | Admitting: Family Medicine

## 2014-06-02 VITALS — BP 138/82 | Temp 98.1°F | Wt 146.0 lb

## 2014-06-02 DIAGNOSIS — L03031 Cellulitis of right toe: Secondary | ICD-10-CM

## 2014-06-02 DIAGNOSIS — S92911A Unspecified fracture of right toe(s), initial encounter for closed fracture: Secondary | ICD-10-CM | POA: Diagnosis not present

## 2014-06-02 NOTE — Progress Notes (Signed)
[name], MD Phone: [phone]  Subjective:   [name] is a 74 y.o. year old very pleasant female patient who presents with the following:  RIght 2nd toe fracture (middle phalanx) Wore post op show for several days. Saw orthopedics Dr. Lorin Mercy after our last visit. X-ray showed fracture only in 2nd toe and not irregularity in 3rd. Patient was advised to stop post op shoe and just wear firm shoe. No follow up planned. They were not concerned about infection/osteo per patient. i have yet to see their nots. Patient states pain is continuing to slowly improve in that toe and now only mainly hurts when pushing on it. Redness/swelling in toe has resolved. Foot has some mild swelling at times.   ROS- no fever/chills/nausea/vomiting/worsening redness.   Past Medical History- Patient Active Problem List   Diagnosis Date Noted  . Exocrine pancreatic insufficiency 12/30/2013    Priority: High  . Carcinoma of head of pancreas 07/01/2013    Priority: High  . MITRAL VALVE PROLAPSE 02/26/2007    Priority: High  . Memory loss 01/08/2014    Priority: Medium  . Other abnormal glucose 11/29/2013    Priority: Medium  . 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 Paresthesia of foot 06/06/2012    Priority: Medium  . Syncope 09/26/2011    Priority: Medium  . HYPOTHYROIDISM 02/26/2007    Priority: Medium  . HYPERLIPIDEMIA 02/26/2007    Priority: Medium  . ANXIETY 02/26/2007    Priority: Medium  . DEPRESSION 02/26/2007    Priority: Medium  . HYPERTENSION 02/26/2007    Priority: Medium  . Abnormality of gait 03/27/2014    Priority: Low  . RHINOSINUSITIS, CHRONIC 02/03/2009    Priority: Low  . COLONIC POLYPS 06/05/2008    Priority: Low  . OSTEOARTHRITIS 06/04/2008    Priority: Low  . DIARRHEA, CHRONIC 04/22/2008    Priority: Low  . Low back pain 08/03/2007    Priority: Low  . Chronic maxillary sinusitis 04/20/2007    Priority: Low  .  TARDIVE DYSKINESIA 02/26/2007    Priority: Low  . GLAUCOMA 02/26/2007    Priority: Low  . EUSTACHIAN TUBE DYSFUNCTION 02/26/2007    Priority: Low  . ALLERGIC RHINITIS 02/26/2007    Priority: Low  . GERD 02/26/2007    Priority: Low  . HIATAL HERNIA 02/26/2007    Priority: Low  . OSTEOPOROSIS 02/26/2007    Priority: Low   Medications- reviewed and updated Current Outpatient Prescriptions  Medication Sig Dispense Refill  . aspirin 81 MG tablet Take 81 mg by mouth at bedtime.     Marland Kitchen atenolol (TENORMIN) 25 MG tablet Take 25 mg by mouth every morning.     . cholecalciferol (VITAMIN D) 1000 UNITS tablet Take 1,000 Units by mouth daily.     Marland Kitchen docusate sodium (COLACE) 100 MG capsule Take 200 mg by mouth at bedtime.    Marland Kitchen FLUoxetine (PROZAC) 20 MG capsule Take 20 mg by mouth daily.    Marland Kitchen gabapentin (NEURONTIN) 300 MG capsule 2 tabs qid 250 capsule 11  . hydrOXYzine (VISTARIL) 25 MG capsule Take 25 mg by mouth at bedtime.    . lactose free nutrition (BOOST) LIQD Take 237 mLs by mouth daily.    Marland Kitchen levothyroxine (SYNTHROID, LEVOTHROID) 175 MCG tablet Take 1 tablet (175 mcg total) by mouth daily before breakfast. 90 tablet 1  . lipase/protease/amylase (CREON) 12000 UNITS CPEP capsule Take 2 capsules (24,000 Units total) by mouth 3 (three)  times daily before meals. 270 capsule 5  . loratadine (CLARITIN) 10 MG tablet Take 10 mg by mouth daily.    Marland Kitchen LORazepam (ATIVAN) 0.5 MG tablet Take 1 tablet (0.5 mg total) by mouth 3 (three) times daily. 30 tablet 2  . lovastatin (MEVACOR) 40 MG tablet Take 40 mg by mouth at bedtime.    . Multiple Vitamin (MULTIVITAMIN WITH MINERALS) TABS tablet Take 1 tablet by mouth daily.    Marland Kitchen omeprazole (PRILOSEC OTC) 20 MG tablet Take 20 mg by mouth 2 (two) times daily.     . polycarbophil (FIBERCON) 625 MG tablet Take 1,250 mg by mouth at bedtime.    Marland Kitchen rOPINIRole (REQUIP) 0.5 MG tablet One po qhs xone week, then 2 tabs po qhs 60 tablet 11  . topiramate (TOPAMAX) 200 MG tablet  TAKE 1 TABLET BY MOUTH TWICE A DAY 90 tablet 1  . UNABLE TO FIND Apply 1 application topically 4 (four) times daily. Doxep/Amanta/DXM/Lidocaine 25 mg for her feet    . vitamin E 400 UNIT capsule Take 800 Units by mouth 2 (two) times daily. For tartive dyskinesia    . acetaminophen (TYLENOL) 325 MG tablet Take 650 mg by mouth every 6 (six) hours as needed for mild pain, moderate pain or fever.    Marland Kitchen aluminum & magnesium hydroxide-simethicone (MYLANTA) 500-450-40 MG/5ML suspension Take 5 mLs by mouth every 6 (six) hours as needed for indigestion.    . carboxymethylcellulose (REFRESH TEARS) 0.5 % SOLN Place 1 drop into both eyes 2 (two) times daily.      No current facility-administered medications for this visit.    Objective: BP 138/82 mmHg  Temp(Src) 98.1 F (36.7 C)  Wt 146 lb (66.225 kg) Gen: NAD, resting comfortably CV: RRR no murmurs rubs or gallops, 2+ DP and PT pulses Ext:  Good capillary refills in 2nd toe distally, only slight edema, no warmth. Right 2nd toe with purplish/bruised discoloration but no erythema. Pain with flexing toe and palpation up to MTP. Improved from 2 weeks ago.  Skin: warm, dry, no rash other than on toe   Assessment/Plan:  Right 2nd toe fracture Cellulitis Improved pain. Was seen by orthopedics who reportedly was not concerned about infection and also did not see fracture in 3rd toe so did not advise post op shoe but simply some good regular shoe use. Still awaiting records.Patient finished her antibiotic course and toe no longer has cellulitic appearance but instead a look of bruising. Tenderness mainly with palpation at this point. Patient has planned follow up for her neuropathy next week with neurology and wants to discuss if anything else can be done for her pain at this point.  Return precautions advised.

## 2014-06-02 NOTE — Patient Instructions (Addendum)
Glad the foot pain is better. I think the infection is gone at this point. Thank you for seeing Dr. Lorin Mercy.

## 2014-06-09 ENCOUNTER — Encounter: Payer: Self-pay | Admitting: Neurology

## 2014-06-09 ENCOUNTER — Ambulatory Visit (INDEPENDENT_AMBULATORY_CARE_PROVIDER_SITE_OTHER): Payer: Medicare Other | Admitting: Neurology

## 2014-06-09 DIAGNOSIS — M545 Low back pain: Secondary | ICD-10-CM

## 2014-06-09 DIAGNOSIS — M79672 Pain in left foot: Secondary | ICD-10-CM | POA: Diagnosis not present

## 2014-06-09 DIAGNOSIS — R269 Unspecified abnormalities of gait and mobility: Secondary | ICD-10-CM

## 2014-06-09 DIAGNOSIS — M79671 Pain in right foot: Secondary | ICD-10-CM

## 2014-06-09 DIAGNOSIS — G629 Polyneuropathy, unspecified: Secondary | ICD-10-CM | POA: Diagnosis not present

## 2014-06-09 DIAGNOSIS — R202 Paresthesia of skin: Secondary | ICD-10-CM | POA: Diagnosis not present

## 2014-06-09 NOTE — Procedures (Signed)
Patient was in left lateral recombinant position. Sterile technique. 1% lidocaine with epinephrine was used for local anesthesia. Punctuated skin biopsy was performed. 3 mm skin sample were obtained at at right foot, above right extensor digitorum brevis, and right lateral calf, 10 cm above lateral malleolus, lateral thigh, 20 cm below superior iliac spine.  Patient tolerated the procedure well.  The wound was covered with neosporin antibiotic cream and bandage. 

## 2014-06-09 NOTE — Progress Notes (Signed)
Skin Biopsy, see procedure note and consent form in Letter

## 2014-06-12 ENCOUNTER — Ambulatory Visit: Payer: Medicare Other | Attending: Neurology

## 2014-06-12 DIAGNOSIS — R279 Unspecified lack of coordination: Secondary | ICD-10-CM | POA: Diagnosis not present

## 2014-06-12 DIAGNOSIS — R269 Unspecified abnormalities of gait and mobility: Secondary | ICD-10-CM

## 2014-06-13 NOTE — Therapy (Signed)
[facility] [address] [place], [place], [address] Phone: [phone]   Fax:  [phone]  Physical Therapy Evaluation  Patient Details  Name: [name] MRN: [id number] Date of Birth: 08-20-1940 Referring Provider:  Marcial Pacas, MD  Encounter Date: 06/12/2014      PT End of Session - 06/13/14 0916    Visit Number 1   Number of Visits 17   Date for PT Re-Evaluation 08/11/14   Authorization Type Medicare Triad-G code every 10th visit   PT Start Time 4742   PT Stop Time 1449   PT Time Calculation (min) 46 min      Past Medical History  Diagnosis Date  . Osteoarthritis   . Hiatal hernia   . Chronic maxillary sinusitis     neti pot  . Depression     alone a lot  . Eustachian tube dysfunction   . Tardive dyskinesia     possibly reglan, vitamin E helps  . Mitral valve prolapse     antibiotics before dental procedures  . GERD (gastroesophageal reflux disease)   . Allergic rhinitis   . Glaucoma   . Hypothyroid   . Hypertension   . Heart murmur     hx. "MVP" -predental antibiotics  . Memory loss     short term memory loss  . Urgency of urination     some UTI in past  . Stroke     mini storkes left leg paraylsis. patient denies weakness 01/08/14.   . Cancer 07/10/13    Pancreatic cancer with MRI scan 06-19-13    Past Surgical History  Procedure Laterality Date  . Knee surgery Left     x 5, total knee Left knee  . 1 baker cyst removed    . Dilation and curettage of uterus      x3  . Lumbar spine surgery      x2  . Abdominal hysterectomy      including ovaries  . Lumbar spine surgery      cyst  . Joint replacement      LTKA  . Blepharoplasty Bilateral     with cataract surgery  . Eye surgery Right     cataract  . Eus N/A 07/10/2013    Procedure: ESOPHAGEAL ENDOSCOPIC ULTRASOUND (EUS) RADIAL;  Surgeon: Arta Silence, MD;  Location: [facility];  Service: Endoscopy;  Laterality: N/A;  . Fine  needle aspiration N/A 07/10/2013    Procedure: FINE NEEDLE ASPIRATION (FNA) LINEAR;  Surgeon: Arta Silence, MD;  Location: [facility];  Service: Endoscopy;  Laterality: N/A;  possible fna  . Back surgery      fusion  . Breast surgery      Biopsy left 2 times  . Colonoscopy w/ polypectomy      2004 last colonoscopy, no polyps  . Laparoscopy N/A 08/07/2013    Procedure: LAPAROSCOPY DIAGNOSTIC;  Surgeon: [name], MD;  Location: [facility];  Service: General;  Laterality: N/A;  . Whipple procedure N/A 08/07/2013    Procedure: WHIPPLE PROCEDURE;  Surgeon: [name], MD;  Location: [facility];  Service: General;  Laterality: N/A;  . Esophagogastroduodenoscopy N/A 09/11/2013    Procedure: ESOPHAGOGASTRODUODENOSCOPY (EGD);  Surgeon: Cleotis Nipper, MD;  Location: [facility];  Service: Endoscopy;  Laterality: N/A;  Moderate sedation okay if MAC not available    There were no vitals taken for this visit.  Visit Diagnosis:  Lack of coordination - Plan: PT plan of care cert/re-cert  Abnormality of gait - Plan: PT plan of care cert/re-cert      Subjective Assessment - 06/12/14 1411    Symptoms Pt presents with c/o bilateral foot pain and numbness which she reports is traveling upward into her legs. On 06/09/14 she had a skin biopsy by Dr. Krista Blue and is awaiting results to see if she has neuropathy. She also reports she has had multiple falls.  Most recently  The most recent fall was in March 2015.   Pertinent History CVA, pancreatic cancer, hiatal hernia, tardive dyskinesia, L knee replacement   Patient Stated Goals To resolve her foot pain. She wants to square dance--she hasn't been able to for 1 year.    Currently in Pain? Yes   Pain Score 8    Pain Location Foot   Pain Orientation Right;Left   Pain Descriptors / Indicators Numbness;Tingling;Pins and needles   Pain Type Intractable pain          [facility] PT Assessment - 06/13/14 0001    Assessment   Medical Diagnosis low back pain, bilateral  foot pain, abnormality of gait   Onset Date --  April2015   Prior Therapy none   Precautions   Precautions Fall  hx of cancer; no lifting >10#   Balance Screen   Has the patient fallen in the past 6 months Yes   How many times? 1   Has the patient had a decrease in activity level because of a fear of falling?  Yes   Is the patient reluctant to leave their home because of a fear of falling?  No   Home Environment   Living Enviornment Private residence   Living Arrangements Alone   Available Help at Discharge Friend(s);Neighbor;Family   Type of Home House   Home Access Stairs to enter   Entrance Stairs-Number of Steps 2   Entrance Stairs-Rails None   Home Layout One level   Gonzales - single point;Walker - 2 wheels;Tub bench   Cognition   Overall Cognitive Status History of cognitive impairments - at baseline   Observation/Other Assessments   Observations Swelling of both ankles posteriorly,    Focus on Therapeutic Outcomes (FOTO)  not taken due to pt cognitive impariment   Sensation   Light Touch Impaired Detail   Light Touch Impaired Details --  absent on left great toe, and on right second toe,   Palpation   Palpation tender to touch around both ankles and entirety of feet   poor circulation in toes cold bilaterally   Ambulation/Gait   Ambulation/Gait Yes   Ambulation/Gait Assistance 5: Supervision   Ambulation Distance (Feet) 100 Feet   Assistive device None   Gait Pattern Narrow base of support;Trendelenburg  left sway outside of path, ankle instability   Ambulation Surface Level;Indoor   Gait velocity 2.48 ft/sec   Standardized Balance Assessment   Standardized Balance Assessment Timed Up and Go Test   Timed Up and Go Test   TUG Normal TUG   Normal TUG (seconds) 15.47                            PT Short Term Goals - 06/13/14 1740    PT SHORT TERM GOAL #1   Title Be provided with home exercise program to address balance and gait  impairments, ankle instability and muscle weakness. Target 07/11/14   PT SHORT TERM GOAL #2   Title Decrease TUG time to 14.47 seconds for  improved efficiency of mobility. Target 07/11/14   PT SHORT TERM GOAL #3   Title Complete BERG balance test and write appropriate STG and LTG:_________________   PT SHORT TERM GOAL #4   Title Decrease pt's self reported foot pain to 6/10 during therapy session. Target 07/11/14           PT Long Term Goals - July 03, 2014 0925    PT LONG TERM GOAL #1   Title Verbalize understanding of fall prevention education. Target 08/08/14   PT LONG TERM GOAL #2   Title BERG LTG:__________________ Target 08/08/14   PT LONG TERM GOAL #3   Title Decrease TUG time to >13.5 seconds for decreased fall risk; Target 08/08/14   PT LONG TERM GOAL #4   Title Reported decreased bilateral foot pain to 4/10 during therapy session.   PT LONG TERM GOAL #5   Title Increase gait speed to 2.63 ft/sec for community ambulator status with an assistive device if needed. Target 08/08/14               Plan - 07-03-2014 0917    Clinical Impression Statement Pt has multifactorial pain and gait impairment. With history of cellulitis of foot, recent R toe fracture, back pain, possible neuropathy (awaiting test results), muscle weakness and general balance im pairment, as well as cognitive impairment, pt's safety and gait pattern are greatly affected. Pt will benefit from skilled PT services for education and to decrease risk of falls. Unsure if PT will have much affect on the pain as it may be due to neuropathy.   Rehab Potential Fair   Clinical Impairments Affecting Rehab Potential cognitive/memory impairment   PT Frequency 2x / week   PT Duration 8 weeks   PT Treatment/Interventions ADLs/Self Care Home Management;DME Instruction;Gait training;Stair training;Neuromuscular re-education;Balance training;Therapeutic exercise;Therapeutic activities;Patient/family education;Manual techniques   PT  Next Visit Plan Complete Berg Balance Test and write appropriate goal; give HEP to include ankle stability and balance training, hip abductor strength   Consulted and Agree with Plan of Care Patient          G-Codes - 2014-07-03 0927    Functional Assessment Tool Used TUG 15.47 seconds   Functional Limitation Mobility: Walking and moving around   Mobility: Walking and Moving Around Current Status [phone]) At least 20 percent but less than 40 percent impaired, limited or restricted   Mobility: Walking and Moving Around Goal Status [phone]) At least 1 percent but less than 20 percent impaired, limited or restricted       Problem List Patient Active Problem List   Diagnosis Date Noted  . Abnormality of gait 03/27/2014  . Memory loss 01/08/2014  . 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 Other abnormal glucose 11/29/2013  . Carcinoma of head of pancreas 07/01/2013  . 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 Paresthesia of foot 06/06/2012  . Syncope 09/26/2011  . RHINOSINUSITIS, CHRONIC 02/03/2009  . COLONIC POLYPS 06/05/2008  . OSTEOARTHRITIS 06/04/2008  . DIARRHEA, CHRONIC 04/22/2008  . Low back pain 08/03/2007  . Chronic maxillary sinusitis 04/20/2007  . HYPOTHYROIDISM 02/26/2007  . HYPERLIPIDEMIA 02/26/2007  . ANXIETY 02/26/2007  . DEPRESSION 02/26/2007  . TARDIVE DYSKINESIA 02/26/2007  . GLAUCOMA 02/26/2007  . EUSTACHIAN TUBE DYSFUNCTION 02/26/2007  . HYPERTENSION 02/26/2007  . MITRAL VALVE PROLAPSE 02/26/2007  . ALLERGIC RHINITIS 02/26/2007  . GERD 02/26/2007  . HIATAL HERNIA 02/26/2007  . OSTEOPOROSIS 02/26/2007    Delrae Sawyers D 2014/07/03, 9:30 AM  [facility]  [address] [place], [place], [address] Phone: [phone]   Fax:  [phone]

## 2014-06-16 DIAGNOSIS — R202 Paresthesia of skin: Secondary | ICD-10-CM | POA: Diagnosis not present

## 2014-06-16 DIAGNOSIS — G629 Polyneuropathy, unspecified: Secondary | ICD-10-CM | POA: Diagnosis not present

## 2014-06-17 DIAGNOSIS — M79671 Pain in right foot: Secondary | ICD-10-CM | POA: Diagnosis not present

## 2014-06-18 ENCOUNTER — Ambulatory Visit: Payer: Medicare Other | Admitting: Physical Therapy

## 2014-06-18 ENCOUNTER — Encounter: Payer: Self-pay | Admitting: Physical Therapy

## 2014-06-18 DIAGNOSIS — R269 Unspecified abnormalities of gait and mobility: Secondary | ICD-10-CM | POA: Diagnosis not present

## 2014-06-18 DIAGNOSIS — R279 Unspecified lack of coordination: Secondary | ICD-10-CM

## 2014-06-18 NOTE — Patient Instructions (Signed)
Ankle Plantar Flexion / Dorsiflexion, Standing   Stand while holding a stable object. Rise up on toes. Then rock back on heels. Hold each position _5__ seconds. Repeat _10_ times per session. Do _1-2__ sessions per day.  Copyright  VHI. All rights reserved.  Single Leg - Eyes Open   Holding support, lift right leg while maintaining balance over left leg. Progress to removing hands from support surface for longer periods of time. Hold_10___ seconds. Repeat with standing on right leg with left leg lifted. Repeat __3__ times each leg. Do __1-2__ sessions per day.  Copyright  VHI. All rights reserved.  Feet Heel-Toe "Tandem"   With support of counter as needed: Arms at sides, walk a straight line forward and then backwards. Repeat for 3 laps each way. 1-2 times per day. Copyright  VHI. All rights reserved.  Walking on Heels   Use counter for support: Walk on heels forward and then backwards. 3 laps each way. Do _1-2__ sessions per day.  Copyright  VHI. All rights reserved.  Walking on Toes   Using counter for support. Walk on toes forward and then backwards. 3 laps each way. Do _1-2___ sessions per day.  Copyright  VHI. All rights reserved.

## 2014-06-18 NOTE — Therapy (Signed)
[facility] [address] [place], [place], [address] Phone: [phone]   Fax:  [phone]  Physical Therapy Treatment  Patient Details  Name: [name] MRN: [id number] Date of Birth: Dec 17, 1940 Referring Provider:  Marin Olp, MD  Encounter Date: 06/18/2014      PT End of Session - 06/18/14 1111    Visit Number 2   Number of Visits 17   Date for PT Re-Evaluation 08/11/14   Authorization Type Medicare Triad-G code every 10th visit   PT Start Time 1105   PT Stop Time 1147   PT Time Calculation (min) 42 min   Behavior During Therapy [facility] for tasks assessed/performed      Past Medical History  Diagnosis Date  . Osteoarthritis   . Hiatal hernia   . Chronic maxillary sinusitis     neti pot  . Depression     alone a lot  . Eustachian tube dysfunction   . Tardive dyskinesia     possibly reglan, vitamin E helps  . Mitral valve prolapse     antibiotics before dental procedures  . GERD (gastroesophageal reflux disease)   . Allergic rhinitis   . Glaucoma   . Hypothyroid   . Hypertension   . Heart murmur     hx. "MVP" -predental antibiotics  . Memory loss     short term memory loss  . Urgency of urination     some UTI in past  . Stroke     mini storkes left leg paraylsis. patient denies weakness 01/08/14.   . Cancer 07/10/13    Pancreatic cancer with MRI scan 06-19-13    Past Surgical History  Procedure Laterality Date  . Knee surgery Left     x 5, total knee Left knee  . 1 baker cyst removed    . Dilation and curettage of uterus      x3  . Lumbar spine surgery      x2  . Abdominal hysterectomy      including ovaries  . Lumbar spine surgery      cyst  . Joint replacement      LTKA  . Blepharoplasty Bilateral     with cataract surgery  . Eye surgery Right     cataract  . Eus N/A 07/10/2013    Procedure: ESOPHAGEAL ENDOSCOPIC ULTRASOUND (EUS) RADIAL;  Surgeon: Arta Silence, MD;   Location: [facility];  Service: Endoscopy;  Laterality: N/A;  . Fine needle aspiration N/A 07/10/2013    Procedure: FINE NEEDLE ASPIRATION (FNA) LINEAR;  Surgeon: Arta Silence, MD;  Location: [facility];  Service: Endoscopy;  Laterality: N/A;  possible fna  . Back surgery      fusion  . Breast surgery      Biopsy left 2 times  . Colonoscopy w/ polypectomy      2004 last colonoscopy, no polyps  . Laparoscopy N/A 08/07/2013    Procedure: LAPAROSCOPY DIAGNOSTIC;  Surgeon: [name], MD;  Location: [facility];  Service: General;  Laterality: N/A;  . Whipple procedure N/A 08/07/2013    Procedure: WHIPPLE PROCEDURE;  Surgeon: [name], MD;  Location: [facility];  Service: General;  Laterality: N/A;  . Esophagogastroduodenoscopy N/A 09/11/2013    Procedure: ESOPHAGOGASTRODUODENOSCOPY (EGD);  Surgeon: Cleotis Nipper, MD;  Location[facility]COPY;  Service: Endoscopy;  Laterality: N/A;  Moderate sedation okay if MAC not available    There were no vitals taken for this visit.  Visit Diagnosis:  Lack  of coordination  Abnormality of gait      Subjective Assessment - 06/18/14 1105    Symptoms Saw Dr. Lorin Mercy yesterday and he is referring her back to her primary MD to get medication to treat her neuropathy. He does not feel the pain is orthopedic in nature. He released her from his service. Has not heard from her primary MD as of yet to schedule the appointment.                                               Currently in Pain? Yes   Pain Score 7    Pain Location Foot   Pain Orientation Right;Left   Pain Descriptors / Indicators Burning;Numbness;Pins and needles;Tingling   Pain Type Intractable pain   Pain Frequency Constant   Aggravating Factors  wearing shoes for a long while causes swelling and makes pain worse.   Pain Relieving Factors heat, cold, medication           [facility] Adult PT Treatment/Exercise - 06/18/14 1113    Berg Balance Test   Sit to Stand Able to stand without using hands  and stabilize independently   Standing Unsupported Able to stand safely 2 minutes   Sitting with Back Unsupported but Feet Supported on Floor or Stool Able to sit safely and securely 2 minutes   Stand to Sit Controls descent by using hands   Transfers Able to transfer safely, minor use of hands   Standing Unsupported with Eyes Closed Able to stand 10 seconds safely   Standing Ubsupported with Feet Together Able to place feet together independently and stand for 1 minute with supervision   From Standing, Reach Forward with Outstretched Arm Can reach forward >12 cm safely (5")  8 inches   From Standing Position, Pick up Object from Floor Able to pick up shoe safely and easily   From Standing Position, Turn to Look Behind Over each Shoulder Looks behind from both sides and weight shifts well   Turn 360 Degrees Able to turn 360 degrees safely one side only in 4 seconds or less  4.10 right, 4.0 left   Standing Unsupported, Alternately Place Feet on Step/Stool Able to stand independently and safely and complete 8 steps in 20 seconds  10.50 sec's   Standing Unsupported, One Foot in Front Able to plae foot ahead of the other independently and hold 30 seconds   Standing on One Leg Able to lift leg independently and hold > 10 seconds  >10 on left, <3 on right   Total Score 51     Neuro Re-ed: - single leg stance with fingertip UE support 10 sec hold x 3 each leg. Issued as part of HEP. - heel walk, toe walk, and tandem walk all forward/backwards x 3 laps each way with 1 UE support on counter top. Issued as part of HEP.  Exercise: - heel/toe raises x 10 reps. Issued as part of HEP. - Scifit x 4 extremities level 3.5 x 10 minutes with goal RPM >/= 55 for strengthening and activity tolerance.         PT Short Term Goals - 06/13/14 0093    PT SHORT TERM GOAL #1   Title Be provided with home exercise program to address balance and gait impairments, ankle instability and muscle weakness. Target  07/11/14   PT SHORT TERM GOAL #2  Title Decrease TUG time to 14.47 seconds for improved efficiency of mobility. Target 07/11/14   PT SHORT TERM GOAL #3   Title Complete BERG balance test and write appropriate STG and LTG:_________________   PT SHORT TERM GOAL #4   Title Decrease pt's self reported foot pain to 6/10 during therapy session. Target 07/11/14           PT Long Term Goals - 06/13/14 0925    PT LONG TERM GOAL #1   Title Verbalize understanding of fall prevention education. Target 08/08/14   PT LONG TERM GOAL #2   Title BERG LTG:__________________ Target 08/08/14   PT LONG TERM GOAL #3   Title Decrease TUG time to >13.5 seconds for decreased fall risk; Target 08/08/14   PT LONG TERM GOAL #4   Title Reported decreased bilateral foot pain to 4/10 during therapy session.   PT LONG TERM GOAL #5   Title Increase gait speed to 2.63 ft/sec for community ambulator status with an assistive device if needed. Target 08/08/14           Plan - 06/18/14 1111    Clinical Impression Statement Issued HEP today with no complaints from pt. Pt progressing toward goals.   Rehab Potential Fair   Clinical Impairments Affecting Rehab Potential cognitive/memory impairment   PT Frequency 2x / week   PT Duration 8 weeks   PT Treatment/Interventions ADLs/Self Care Home Management;DME Instruction;Gait training;Stair training;Neuromuscular re-education;Balance training;Therapeutic exercise;Therapeutic activities;Patient/family education;Manual techniques   PT Next Visit Plan Continue with ankle stability/strengthening and balance activities.   Consulted and Agree with Plan of Care Patient        Problem List Patient Active Problem List   Diagnosis Date Noted  . Abnormality of gait 03/27/2014  . Memory loss 01/08/2014  . 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 Other abnormal glucose 11/29/2013  . Carcinoma of head of pancreas 07/01/2013  . 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 Paresthesia of foot 06/06/2012  . Syncope 09/26/2011  . RHINOSINUSITIS, CHRONIC 02/03/2009  . COLONIC POLYPS 06/05/2008  . OSTEOARTHRITIS 06/04/2008  . DIARRHEA, CHRONIC 04/22/2008  . Low back pain 08/03/2007  . Chronic maxillary sinusitis 04/20/2007  . HYPOTHYROIDISM 02/26/2007  . HYPERLIPIDEMIA 02/26/2007  . ANXIETY 02/26/2007  . DEPRESSION 02/26/2007  . TARDIVE DYSKINESIA 02/26/2007  . GLAUCOMA 02/26/2007  . EUSTACHIAN TUBE DYSFUNCTION 02/26/2007  . HYPERTENSION 02/26/2007  . MITRAL VALVE PROLAPSE 02/26/2007  . ALLERGIC RHINITIS 02/26/2007  . GERD 02/26/2007  . HIATAL HERNIA 02/26/2007  . OSTEOPOROSIS 02/26/2007    Willow Ora 06/18/2014, 11:59 PM  Willow Ora, PTA, [facility] [address] [place], [place] [address] [phone] 06/18/2014, 11:59 PM

## 2014-06-20 ENCOUNTER — Ambulatory Visit: Payer: Medicare Other

## 2014-06-24 ENCOUNTER — Telehealth: Payer: Self-pay | Admitting: Neurology

## 2014-06-24 NOTE — Telephone Encounter (Signed)
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

## 2014-06-25 NOTE — Telephone Encounter (Signed)
Pt verbalized understanding of results and will keep follow up on 2/8.

## 2014-06-30 ENCOUNTER — Encounter: Payer: Self-pay | Admitting: Neurology

## 2014-07-01 ENCOUNTER — Ambulatory Visit: Payer: Medicare Other | Attending: Neurology | Admitting: Physical Therapy

## 2014-07-01 ENCOUNTER — Encounter: Payer: Self-pay | Admitting: Physical Therapy

## 2014-07-01 DIAGNOSIS — R269 Unspecified abnormalities of gait and mobility: Secondary | ICD-10-CM

## 2014-07-01 DIAGNOSIS — R279 Unspecified lack of coordination: Secondary | ICD-10-CM | POA: Diagnosis not present

## 2014-07-01 NOTE — Therapy (Signed)
[facility] [address] [place], [place], [address] Phone: [phone]   Fax:  [phone]  Physical Therapy Treatment  Patient Details  Name: [name] MRN: [id number] Date of Birth: 1940/08/28 Referring Provider:  Marin Olp, MD  Encounter Date: 07/01/2014      PT End of Session - 07/01/14 1244    Visit Number 3   Number of Visits 17   Date for PT Re-Evaluation 08/11/14   Authorization Type Medicare Triad-G code every 10th visit   PT Start Time 1232   Behavior During Therapy [facility] for tasks assessed/performed      Past Medical History  Diagnosis Date  . Osteoarthritis   . Hiatal hernia   . Chronic maxillary sinusitis     neti pot  . Depression     alone a lot  . Eustachian tube dysfunction   . Tardive dyskinesia     possibly reglan, vitamin E helps  . Mitral valve prolapse     antibiotics before dental procedures  . GERD (gastroesophageal reflux disease)   . Allergic rhinitis   . Glaucoma   . Hypothyroid   . Hypertension   . Heart murmur     hx. "MVP" -predental antibiotics  . Memory loss     short term memory loss  . Urgency of urination     some UTI in past  . Stroke     mini storkes left leg paraylsis. patient denies weakness 01/08/14.   . Cancer 07/10/13    Pancreatic cancer with MRI scan 06-19-13    Past Surgical History  Procedure Laterality Date  . Knee surgery Left     x 5, total knee Left knee  . 1 baker cyst removed    . Dilation and curettage of uterus      x3  . Lumbar spine surgery      x2  . Abdominal hysterectomy      including ovaries  . Lumbar spine surgery      cyst  . Joint replacement      LTKA  . Blepharoplasty Bilateral     with cataract surgery  . Eye surgery Right     cataract  . Eus N/A 07/10/2013    Procedure: ESOPHAGEAL ENDOSCOPIC ULTRASOUND (EUS) RADIAL;  Surgeon: Arta Silence, MD;  Location: [facility];  Service: Endoscopy;  Laterality: N/A;   . Fine needle aspiration N/A 07/10/2013    Procedure: FINE NEEDLE ASPIRATION (FNA) LINEAR;  Surgeon: Arta Silence, MD;  Location: [facility];  Service: Endoscopy;  Laterality: N/A;  possible fna  . Back surgery      fusion  . Breast surgery      Biopsy left 2 times  . Colonoscopy w/ polypectomy      2004 last colonoscopy, no polyps  . Laparoscopy N/A 08/07/2013    Procedure: LAPAROSCOPY DIAGNOSTIC;  Surgeon: [name], MD;  Location: [facility];  Service: General;  Laterality: N/A;  . Whipple procedure N/A 08/07/2013    Procedure: WHIPPLE PROCEDURE;  Surgeon: [name], MD;  Location: [facility];  Service: General;  Laterality: N/A;  . Esophagogastroduodenoscopy N/A 09/11/2013    Procedure: ESOPHAGOGASTRODUODENOSCOPY (EGD);  Surgeon: Cleotis Nipper, MD;  Location: [facility] ENDOSCOPY;  Service: Endoscopy;  Laterality: N/A;  Moderate sedation okay if MAC not available    There were no vitals taken for this visit.  Visit Diagnosis:  Lack of coordination  Abnormality of gait      Subjective Assessment -  07/01/14 1237    Symptoms Has appt with Dr Krista Blue on 07/07/14 to discuss her foot pain and the results of her skin biopsy.    Currently in Pain? Yes   Pain Score 6    Pain Location Foot   Pain Orientation Right;Left   Pain Descriptors / Indicators Pins and needles;Tingling;Burning;Numbness   Pain Type Intractable pain;Neuropathic pain   Pain Frequency Constant   Aggravating Factors  shoes, increased activity   Pain Relieving Factors medicated cream helps for a short time     Treatment: Exercise: - at counter: heel/toe raises x 10 on floor. On air ex: heel/toe raises x 10, alternating hip abduct, hip extension, marches x 10 each bil legs, mini squats x 10 reps.  -  Scifit legs only level 2.5 x 6 minutes with goal RPM >/= 55 for strengthening and activity tolerance.  Neuro Re-ed: - at counter: toe walk, heel walk and tandem walk forward/backwards x 3 laps each/each way with intermittent UE  support on counter  - blue foam beam: tandem walk and side stepping x 3 laps each/each way with 1 UE support on parallel bars; standing with feet across beam: alternating forward heel taps and alternating backward toe taps x 10 each bil feet with UE support on parallel bars.        PT Short Term Goals - 06/13/14 8676    PT SHORT TERM GOAL #1   Title Be provided with home exercise program to address balance and gait impairments, ankle instability and muscle weakness. Target 07/11/14   PT SHORT TERM GOAL #2   Title Decrease TUG time to 14.47 seconds for improved efficiency of mobility. Target 07/11/14   PT SHORT TERM GOAL #3   Title Complete BERG balance test and write appropriate STG and LTG:_________________   PT SHORT TERM GOAL #4   Title Decrease pt's self reported foot pain to 6/10 during therapy session. Target 07/11/14           PT Long Term Goals - 06/13/14 0925    PT LONG TERM GOAL #1   Title Verbalize understanding of fall prevention education. Target 08/08/14   PT LONG TERM GOAL #2   Title BERG LTG:__________________ Target 08/08/14   PT LONG TERM GOAL #3   Title Decrease TUG time to >13.5 seconds for decreased fall risk; Target 08/08/14   PT LONG TERM GOAL #4   Title Reported decreased bilateral foot pain to 4/10 during therapy session.   PT LONG TERM GOAL #5   Title Increase gait speed to 2.63 ft/sec for community ambulator status with an assistive device if needed. Target 08/08/14           Plan - 07/01/14 1244    Rehab Potential Fair   Clinical Impairments Affecting Rehab Potential cognitive/memory impairment   PT Frequency 2x / week   PT Duration 8 weeks   PT Treatment/Interventions ADLs/Self Care Home Management;DME Instruction;Gait training;Stair training;Neuromuscular re-education;Balance training;Therapeutic exercise;Therapeutic activities;Patient/family education;Manual techniques   PT Next Visit Plan Continue with ankle stability/strengthening and balance  activities.   Consulted and Agree with Plan of Care Patient      Problem List Patient Active Problem List   Diagnosis Date Noted  . Abnormality of gait 03/27/2014  . Memory loss 01/08/2014  . 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 Other abnormal glucose 11/29/2013  . Carcinoma of head of pancreas 07/01/2013  . 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 Paresthesia of foot 06/06/2012  . Syncope 09/26/2011  .  RHINOSINUSITIS, CHRONIC 02/03/2009  . COLONIC POLYPS 06/05/2008  . OSTEOARTHRITIS 06/04/2008  . DIARRHEA, CHRONIC 04/22/2008  . Low back pain 08/03/2007  . Chronic maxillary sinusitis 04/20/2007  . HYPOTHYROIDISM 02/26/2007  . HYPERLIPIDEMIA 02/26/2007  . ANXIETY 02/26/2007  . DEPRESSION 02/26/2007  . TARDIVE DYSKINESIA 02/26/2007  . GLAUCOMA 02/26/2007  . EUSTACHIAN TUBE DYSFUNCTION 02/26/2007  . HYPERTENSION 02/26/2007  . MITRAL VALVE PROLAPSE 02/26/2007  . ALLERGIC RHINITIS 02/26/2007  . GERD 02/26/2007  . HIATAL HERNIA 02/26/2007  . OSTEOPOROSIS 02/26/2007    Willow Ora 07/01/2014, 12:45 PM  Willow Ora, PTA, [facility] [address] [place], [place] [address] [phone] 07/01/2014, 1:13 PM

## 2014-07-03 ENCOUNTER — Ambulatory Visit: Payer: Medicare Other | Admitting: Neurology

## 2014-07-03 ENCOUNTER — Ambulatory Visit: Payer: Medicare Other

## 2014-07-03 DIAGNOSIS — R279 Unspecified lack of coordination: Secondary | ICD-10-CM | POA: Diagnosis not present

## 2014-07-03 DIAGNOSIS — R269 Unspecified abnormalities of gait and mobility: Secondary | ICD-10-CM | POA: Diagnosis not present

## 2014-07-03 NOTE — Therapy (Signed)
[facility] [address] [place], [place], [address] Phone: [phone]   Fax:  [phone]  Physical Therapy Treatment  Patient Details  Name: [name] MRN: [id number] Date of Birth: 02/28/1941 Referring Provider:  Marin Olp, MD  Encounter Date: 07/03/2014      PT End of Session - 07/03/14 1552    Visit Number 4   Number of Visits 17   Date for PT Re-Evaluation 08/11/14   Authorization Type Medicare Triad-G code every 10th visit   PT Start Time 1319   PT Stop Time 1400   PT Time Calculation (min) 41 min      Past Medical History  Diagnosis Date  . Osteoarthritis   . Hiatal hernia   . Chronic maxillary sinusitis     neti pot  . Depression     alone a lot  . Eustachian tube dysfunction   . Tardive dyskinesia     possibly reglan, vitamin E helps  . Mitral valve prolapse     antibiotics before dental procedures  . GERD (gastroesophageal reflux disease)   . Allergic rhinitis   . Glaucoma   . Hypothyroid   . Hypertension   . Heart murmur     hx. "MVP" -predental antibiotics  . Memory loss     short term memory loss  . Urgency of urination     some UTI in past  . Stroke     mini storkes left leg paraylsis. patient denies weakness 01/08/14.   . Cancer 07/10/13    Pancreatic cancer with MRI scan 06-19-13    Past Surgical History  Procedure Laterality Date  . Knee surgery Left     x 5, total knee Left knee  . 1 baker cyst removed    . Dilation and curettage of uterus      x3  . Lumbar spine surgery      x2  . Abdominal hysterectomy      including ovaries  . Lumbar spine surgery      cyst  . Joint replacement      LTKA  . Blepharoplasty Bilateral     with cataract surgery  . Eye surgery Right     cataract  . Eus N/A 07/10/2013    Procedure: ESOPHAGEAL ENDOSCOPIC ULTRASOUND (EUS) RADIAL;  Surgeon: Arta Silence, MD;  Location: [facility];  Service: Endoscopy;  Laterality: N/A;  .  Fine needle aspiration N/A 07/10/2013    Procedure: FINE NEEDLE ASPIRATION (FNA) LINEAR;  Surgeon: Arta Silence, MD;  Location: [facility];  Service: Endoscopy;  Laterality: N/A;  possible fna  . Back surgery      fusion  . Breast surgery      Biopsy left 2 times  . Colonoscopy w/ polypectomy      2004 last colonoscopy, no polyps  . Laparoscopy N/A 08/07/2013    Procedure: LAPAROSCOPY DIAGNOSTIC;  Surgeon: [name], MD;  Location: [facility];  Service: General;  Laterality: N/A;  . Whipple procedure N/A 08/07/2013    Procedure: WHIPPLE PROCEDURE;  Surgeon: [name], MD;  Location: [facility];  Service: General;  Laterality: N/A;  . Esophagogastroduodenoscopy N/A 09/11/2013    Procedure: ESOPHAGOGASTRODUODENOSCOPY (EGD);  Surgeon: Cleotis Nipper, MD;  Location: [facility]COPY;  Service: Endoscopy;  Laterality: N/A;  Moderate sedation okay if MAC not available    There were no vitals taken for this visit.  Visit Diagnosis:  Lack of coordination  Abnormality of gait  Subjective Assessment - 07/03/14 1325    Symptoms I take boost and meclizine as needed.   Currently in Pain? No/denies     Neuro re-ed Alternate foot taps 20x on cones without UE support with CGA, then 10x while standing on blue airex without UE support with CGA Cone flipping/tipping 20x with MIN A to steady Standing on airex with eyex closed head turns horizontal/vertical/each diagonal all 10x each. Lateral walking right and left on complaint balance beam 5 laps with intermittent UE support and intermittent CGA Tandem walking forward and backward on complaint balance beam with intermittent UE support and MIN A. Pt rolled right ankle into inversion and initially reported pain in the right ankle. She was assisted to a chair to rest for 2-3 minutes and reported decreased pain, then was assisted to ambulate to a mat where she performed open chain exercises.  Therex 2x10 straight leg raises in supine with 3# ankle  weights 2x15 hooklying hip abduction with green theraband 2x15 hip adduction with ball resistance occasional right posterior hip pain reported, not rated, described as brief and sharp  -Single knee to chest with the RLE  And single knee to opposite side of chest with RLE no pain reported -anterior and posterior pelvic tilt with no pain reported -PT palpated the sacroiliac joint on right and left and pt reported pain bilaterally                       PT Short Term Goals - 06/13/14 9604    PT SHORT TERM GOAL #1   Title Be provided with home exercise program to address balance and gait impairments, ankle instability and muscle weakness. Target 07/11/14   PT SHORT TERM GOAL #2   Title Decrease TUG time to 14.47 seconds for improved efficiency of mobility. Target 07/11/14   PT SHORT TERM GOAL #3   Title Complete BERG balance test and write appropriate STG and LTG:_________________   PT SHORT TERM GOAL #4   Title Decrease pt's self reported foot pain to 6/10 during therapy session. Target 07/11/14           PT Long Term Goals - 06/13/14 0925    PT LONG TERM GOAL #1   Title Verbalize understanding of fall prevention education. Target 08/08/14   PT LONG TERM GOAL #2   Title BERG LTG:__________________ Target 08/08/14   PT LONG TERM GOAL #3   Title Decrease TUG time to >13.5 seconds for decreased fall risk; Target 08/08/14   PT LONG TERM GOAL #4   Title Reported decreased bilateral foot pain to 4/10 during therapy session.   PT LONG TERM GOAL #5   Title Increase gait speed to 2.63 ft/sec for community ambulator status with an assistive device if needed. Target 08/08/14               Plan - 07/03/14 1552    Clinical Impression Statement Pt continues to demonstrate ankle instability and actually gently rolled the ankle while in therapy today. No signs of increased swelling and after 2 minute rest pt reported the pain was improving and by end of session pt no longer  reported pain in it. Pt will continue to benefit form ankle strengthening exercises and may want to consider AFOs or shoe inserts for improved medial lateral stability.   PT Next Visit Plan SIJ distraction via compression of iliacs (not mobilization of the sacrum due to hx of spinal surgery; discuss possibility to utilizing orthotic inserts for  ankle stability.        Problem List Patient Active Problem List   Diagnosis Date Noted  . Abnormality of gait 03/27/2014  . Memory loss 01/08/2014  . 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 Other abnormal glucose 11/29/2013  . Carcinoma of head of pancreas 07/01/2013  . 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 Paresthesia of foot 06/06/2012  . Syncope 09/26/2011  . RHINOSINUSITIS, CHRONIC 02/03/2009  . COLONIC POLYPS 06/05/2008  . OSTEOARTHRITIS 06/04/2008  . DIARRHEA, CHRONIC 04/22/2008  . Low back pain 08/03/2007  . Chronic maxillary sinusitis 04/20/2007  . HYPOTHYROIDISM 02/26/2007  . HYPERLIPIDEMIA 02/26/2007  . ANXIETY 02/26/2007  . DEPRESSION 02/26/2007  . TARDIVE DYSKINESIA 02/26/2007  . GLAUCOMA 02/26/2007  . EUSTACHIAN TUBE DYSFUNCTION 02/26/2007  . HYPERTENSION 02/26/2007  . MITRAL VALVE PROLAPSE 02/26/2007  . ALLERGIC RHINITIS 02/26/2007  . GERD 02/26/2007  . HIATAL HERNIA 02/26/2007  . OSTEOPOROSIS 02/26/2007    Delrae Sawyers D 07/03/2014, 3:59 PM  [facility] [address] [place], [place], [address] Phone: [phone]   Fax:  [phone]

## 2014-07-07 ENCOUNTER — Ambulatory Visit (INDEPENDENT_AMBULATORY_CARE_PROVIDER_SITE_OTHER): Payer: Medicare Other | Admitting: Neurology

## 2014-07-07 ENCOUNTER — Encounter: Payer: Self-pay | Admitting: Neurology

## 2014-07-07 VITALS — BP 131/72 | HR 69 | Ht 62.0 in | Wt 144.0 lb

## 2014-07-07 DIAGNOSIS — R269 Unspecified abnormalities of gait and mobility: Secondary | ICD-10-CM | POA: Diagnosis not present

## 2014-07-07 DIAGNOSIS — R202 Paresthesia of skin: Secondary | ICD-10-CM | POA: Diagnosis not present

## 2014-07-07 DIAGNOSIS — R799 Abnormal finding of blood chemistry, unspecified: Secondary | ICD-10-CM | POA: Diagnosis not present

## 2014-07-07 DIAGNOSIS — R209 Unspecified disturbances of skin sensation: Secondary | ICD-10-CM | POA: Diagnosis not present

## 2014-07-07 DIAGNOSIS — M545 Low back pain: Secondary | ICD-10-CM | POA: Diagnosis not present

## 2014-07-07 NOTE — Progress Notes (Signed)
[facility]  PATIENT: [name] DOB: [birth year]  HISTORY OF PRESENT ILLNESS:Ms. Cowsert, 74 year old female returns for followup. She is accompanied by her son  I have seen her over the past few years for bilateral lower extremity pain, bilateral feet paresthesia  She had past medical history of hypertension, hyperlipidemia, depression, is on polypharmacy treatment, including Prozac 20 mg every day, Ativan 3 times a day, Topamax 200 mg twice a day,   She presented with more than 10 years history of bilateral feet paresthesia, getting worse since 2010, she described bilateral feet numbness tingling, going up to her legs, her feet felt like it was broken sometimes, her feet burns, ice pack helps some, but sometimes she put on socks, wrapping tightly around her socks, which seems to help her symptoms some, her feet pain getting worse after bearing weight.  She also has a history of chronic low back pain, had a history of low back decompression surgery by Dr. Lorin Mercy in 2004, she denies shooting pain to her neck, no bowel bladder incontinence, mild gait difficulty due to feet pain, no significant weakness   Most recent laboratory evaluation showed normal vitamin B12, TSH, CMP, elevated WBC 15.7  CBC, RPR, ANA, vitamin B12, TSH, mild elevated C. reactive protein of 11.2,   She has tried compounding cream, which has made her skin peel, she is taking gabapentin 300 mg 3 times a day, which helped her 60%, also make her tired, the most bothersome symptoms at nighttime, she complains of bilateral plantar feet burning pain, she has to wrap her toes together, soak it in hot water, which only helped her temporarily.   Over the past few years, she also developed gradual worsening gait difficulty, she presented with right thoracic area pain, eventually was diagnosed with pancreatic cancer, had a Whipple procedure in March 2015, no chemo, radiation therapy  UPDATE OCt 29th 2015:  She  continued to complains of bilateral feet pain, numbness, getting worse after bearing weight, she is taking up to 2400 mg of gabapentin each day, still has difficulty sleeping because her feet wake her up every couple hours, can be an help some  Worsening gait difficulty, tendency to fall, no bowel or bladder incontinence,  UPDATE Dec 1st 2015: She came in earlier as expected, because continued to have bilateral feet deep achy pain, involving whole feet, difficulty sleeping because of her feet discomfort, and deep achy pain, she has to get up in the middle of the night, using hot water, sometimes ices to ease up the discomfort, she also complains of mild unsteady gait, bilateral fingertips paresthesia  EMG nerve conduction study in June 2014, showed no large fiber peripheral neuropathy, no evidence of lumbar radiculopathy   We have reviewed MRI lumbar in November 2015, 1. At L3-4: disc bulging and facet hypertrophy with moderate biforaminal stenosis  2. At L2-3: disc bulging and facet hypertrophy with mild biforaminal stenosis  3. Posterior decompression and interbody fusion from L4-L5-S1 with metal screws and adjoining hardware. She could not tolerate nortriptyline, complains of nausea,  UPDATE Feb 8th 2016: She continues to complains of needle sticking pain at the top of her feet, feet muscle, and calf muscle spasm,  Skin biopsy was abnormal significantly decreased nerve fiber density at the right thigh, right foot, consistent with small fiber neuropathy, epidermal nerve fiber density was normal in the right calf, right foot showed normal sweat gland nerve fiber density.  REVIEW OF SYSTEMS: Full 14 system review of systems  performed and notable only for those listed, all others are neg:  As above   ALLERGIES: Allergies  Allergen Reactions  . Other Other (See Comments)    According to patient Trilafor and Reglan cause Tardive Dyskinesia  . Metoclopramide Hcl Other (See Comments)     Shaking; caused tardive dyskinesia  . Niacin Other (See Comments)    shaking  . Trovan [Alatrofloxacin Mesylate] Other (See Comments)    Caused shaking and nervousness  . Nortriptyline Other (See Comments)    Dizziness   . Benzocaine-Resorcinol Rash  . Celecoxib Other (See Comments)    unknown  . Glucosamine Other (See Comments)    unknown  . Phenazopyridine Hcl Other (See Comments)    unknown  . Sulfa Antibiotics Nausea And Vomiting and Rash  . Sulfonamide Derivatives Nausea And Vomiting and Rash    HOME MEDICATIONS: Outpatient Prescriptions Prior to Visit  Medication Sig Dispense Refill  . acetaminophen (TYLENOL) 325 MG tablet Take 650 mg by mouth every 6 (six) hours as needed for mild pain, moderate pain or fever.    Marland Kitchen aluminum & magnesium hydroxide-simethicone (MYLANTA) 500-450-40 MG/5ML suspension Take 5 mLs by mouth every 6 (six) hours as needed for indigestion.    Marland Kitchen aspirin 81 MG tablet Take 81 mg by mouth at bedtime.     Marland Kitchen atenolol (TENORMIN) 25 MG tablet Take 25 mg by mouth every morning.     . carboxymethylcellulose (REFRESH TEARS) 0.5 % SOLN Place 1 drop into both eyes 2 (two) times daily.     . cholecalciferol (VITAMIN D) 1000 UNITS tablet Take 1,000 Units by mouth daily.     Marland Kitchen docusate sodium (COLACE) 100 MG capsule Take 200 mg by mouth at bedtime.    Marland Kitchen FLUoxetine (PROZAC) 20 MG capsule Take 20 mg by mouth daily.    Marland Kitchen gabapentin (NEURONTIN) 300 MG capsule 2 tabs qid 250 capsule 11  . lactose free nutrition (BOOST) LIQD Take 237 mLs by mouth daily.    Marland Kitchen levothyroxine (SYNTHROID, LEVOTHROID) 175 MCG tablet Take 1 tablet (175 mcg total) by mouth daily before breakfast. 90 tablet 1  . lipase/protease/amylase (CREON) 12000 UNITS CPEP capsule Take 2 capsules (24,000 Units total) by mouth 3 (three) times daily before meals. 270 capsule 5  . loratadine (CLARITIN) 10 MG tablet Take 10 mg by mouth daily.    Marland Kitchen lovastatin (MEVACOR) 40 MG tablet Take 40 mg by mouth at bedtime.    .  meclizine (ANTIVERT) 32 MG tablet Take 2 mg by mouth 3 (three) times daily as needed.    . Multiple Vitamin (MULTIVITAMIN WITH MINERALS) TABS tablet Take 1 tablet by mouth daily.    Marland Kitchen omeprazole (PRILOSEC OTC) 20 MG tablet Take 20 mg by mouth 2 (two) times daily.     . polycarbophil (FIBERCON) 625 MG tablet Take 1,250 mg by mouth at bedtime.    . promethazine (PHENERGAN) 25 MG tablet Take 25 mg by mouth every 6 (six) hours as needed for nausea or vomiting.    Marland Kitchen rOPINIRole (REQUIP) 0.5 MG tablet One po qhs xone week, then 2 tabs po qhs 60 tablet 11  . topiramate (TOPAMAX) 200 MG tablet TAKE 1 TABLET BY MOUTH TWICE A DAY 90 tablet 1  . UNABLE TO FIND Apply 1 application topically 4 (four) times daily. Doxep/Amanta/DXM/Lidocaine 25 mg for her feet    . vitamin E 400 UNIT capsule Take 800 Units by mouth 2 (two) times daily. For tartive dyskinesia    . busPIRone (  BUSPAR) 10 MG tablet Take 10 mg by mouth 3 (three) times daily.    . hydrOXYzine (VISTARIL) 25 MG capsule Take 25 mg by mouth at bedtime.    Marland Kitchen LORazepam (ATIVAN) 0.5 MG tablet Take 1 tablet (0.5 mg total) by mouth 3 (three) times daily. (Patient not taking: Reported on 07/03/2014) 30 tablet 2   No facility-administered medications prior to visit.    PAST MEDICAL HISTORY: Past Medical History  Diagnosis Date  . Osteoarthritis   . Hiatal hernia   . Chronic maxillary sinusitis     neti pot  . Depression     alone a lot  . Eustachian tube dysfunction   . Tardive dyskinesia     possibly reglan, vitamin E helps  . Mitral valve prolapse     antibiotics before dental procedures  . GERD (gastroesophageal reflux disease)   . Allergic rhinitis   . Glaucoma   . Hypothyroid   . Hypertension   . Heart murmur     hx. "MVP" -predental antibiotics  . Memory loss     short term memory loss  . Urgency of urination     some UTI in past  . Stroke     mini storkes left leg paraylsis. patient denies weakness 01/08/14.   . Cancer 07/10/13     Pancreatic cancer with MRI scan 06-19-13    PAST SURGICAL HISTORY: Past Surgical History  Procedure Laterality Date  . Knee surgery Left     x 5, total knee Left knee  . 1 baker cyst removed    . Dilation and curettage of uterus      x3  . Lumbar spine surgery      x2  . Abdominal hysterectomy      including ovaries  . Lumbar spine surgery      cyst  . Joint replacement      LTKA  . Blepharoplasty Bilateral     with cataract surgery  . Eye surgery Right     cataract  . Eus N/A 07/10/2013    Procedure: ESOPHAGEAL ENDOSCOPIC ULTRASOUND (EUS) RADIAL;  Surgeon: Arta Silence, MD;  Location: [facility];  Service: Endoscopy;  Laterality: N/A;  . Fine needle aspiration N/A 07/10/2013    Procedure: FINE NEEDLE ASPIRATION (FNA) LINEAR;  Surgeon: Arta Silence, MD;  Location: [facility];  Service: Endoscopy;  Laterality: N/A;  possible fna  . Back surgery      fusion  . Breast surgery      Biopsy left 2 times  . Colonoscopy w/ polypectomy      2004 last colonoscopy, no polyps  . Laparoscopy N/A 08/07/2013    Procedure: LAPAROSCOPY DIAGNOSTIC;  Surgeon: [name], MD;  Location: [place];  Service: General;  Laterality: N/A;  . Whipple procedure N/A 08/07/2013    Procedure: WHIPPLE PROCEDURE;  Surgeon: [name], MD;  Location: [place];  Service: General;  Laterality: N/A;  . Esophagogastroduodenoscopy N/A 09/11/2013    Procedure: ESOPHAGOGASTRODUODENOSCOPY (EGD);  Surgeon: Cleotis Nipper, MD;  [facility];  Service: Endoscopy;  Laterality: N/A;  Moderate sedation okay if MAC not available    FAMILY HISTORY: Family History  Problem Relation Age of Onset  . Cancer Mother     Breast Cancer with Metastatic disease  . Alzheimer's disease Father   . Cancer Maternal Uncle     prostate    SOCIAL HISTORY: History   Social History  . Marital Status: Widowed    Spouse Name: N/A  Number of Children: 2  . Years of Education: 8   Occupational History  .    Marland Kitchen  Retired    Social History Main Topics  . Smoking status: Never Smoker   . Smokeless tobacco: Never Used  . Alcohol Use: No  . Drug Use: No  . Sexual Activity: Not Currently   Other Topics Concern  . 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 Generalized: Well developed,  in no acute distress  Head: normocephalic and atraumatic,. Oropharynx benign  Neck: Supple, no carotid bruits ,  Neurological examination  Mentation: Alert oriented to time, place, history taking. Follows all commands speech and language fluent. Mini-Mental Status Examination is 30 out of 30  Cranial nerve II-XII: .Pupils were equal round reactive to light extraocular movements were full, visual field were full on confrontational test. Facial sensation and strength were normal. hearing was intact to finger rubbing bilaterally. Uvula tongue midline. head turning and shoulder shrug were normal and symmetric.Tongue protrusion into cheek strength was normal.  Motor: normal bulk and tone, full strength in the BUE, BLE, No focal weakness, tenderness of bilateral feet upon deep palpation  Sensory: Length dependent decreased fine touch, pinprick to mid shin level bilaterally, decreased vibratory to toes, preserved proprioception  Coordination: finger-nose-finger, heel-to-shin bilaterally, no dysmetria  Reflexes: Brachioradialis 2/2, biceps  2/2, triceps 2/2, patellar 2/2, Achilles trace, plantar responses were flexor bilaterally.  Gait and Station: Rising up from seated position without assistance, cautious, narrow based, was able to stand on tiptoe, and heels,    DIAGNOSTIC DATA (LABS, IMAGING, TESTING) - I reviewed patient records, labs, notes, testing and imaging myself where available.  Lab Results  Component Value Date   WBC 8.4 02/15/2014   HGB 14.8 02/15/2014   HCT 44.4 02/15/2014   MCV 97.4 02/15/2014   PLT 153 02/15/2014      Component Value Date/Time   NA 143 02/15/2014 1445   K 4.2 02/15/2014 1445   CL 105 02/15/2014 1445   CO2 24 02/15/2014 1445   GLUCOSE 99 02/15/2014 1445   BUN 13 02/15/2014 1445   CREATININE 0.83 02/15/2014 1445   CREATININE 0.81 07/01/2013 1324   CALCIUM 9.1 02/15/2014 1445   PROT 7.1 02/15/2014 1445   ALBUMIN 3.9 02/15/2014 1445   AST 42* 02/15/2014 1445   ALT 38* 02/15/2014 1445   ALKPHOS 142* 02/15/2014 1445   BILITOT 0.4 02/15/2014 1445   GFRNONAA 68* 02/15/2014 1445   GFRAA 79* 02/15/2014 1445    Lab Results  Component Value Date   HGBA1C 5.0 11/28/2013   Lab Results  Component Value Date   VITAMINB12 >1500* 10/29/2013   Lab Results  Component Value Date  TSH 1.50 11/27/2013      ASSESSMENT AND PLAN  74 y.o. year old female  has a past medical history of bilateral foot pain paresthesias for greater than 10 years . 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 Previous electrodiagnostic study was normal, there was no evidence of large fiber peripheral neuropathy, she is now complaining of worsening low back pain, previous lumbar decompression surgery, pancreatic cancer, status post Whipple procedure in March 2015,  1, bilateral feet paresthesia, skin biopsy proven small fiber neuropathy,  2 laboratory evaluation for etiology 3. Keep current medications.    Orders Placed This Encounter  Procedures  . CK  .  Sedimentation rate  . C-reactive protein  . ANA w/Reflex if Positive  . IFE and PE, Serum  . Hgb A1c w/o eAG    New Prescriptions   No medications on file    Medications Discontinued During This Encounter  Medication Reason  . busPIRone (BUSPAR) 10 MG tablet Patient Preference  . hydrOXYzine (VISTARIL) 25 MG capsule Patient Preference  . LORazepam (ATIVAN) 0.5 MG tablet Patient Preference    Return in about 6 months (around 01/05/2015).   [facility] [address], [place], [place] [address] [phone]

## 2014-07-08 ENCOUNTER — Ambulatory Visit: Payer: Medicare Other

## 2014-07-09 LAB — IFE AND PE, SERUM
ALPHA2 GLOB SERPL ELPH-MCNC: 0.5 g/dL (ref 0.4–1.2)
Albumin SerPl Elph-Mcnc: 4.1 g/dL (ref 3.2–5.6)
Albumin/Glob SerPl: 1.8 (ref 0.7–2.0)
Alpha 1: 0.2 g/dL (ref 0.1–0.4)
B-Globulin SerPl Elph-Mcnc: 0.9 g/dL (ref 0.6–1.3)
GLOBULIN, TOTAL: 2.4 g/dL (ref 2.0–4.5)
Gamma Glob SerPl Elph-Mcnc: 0.8 g/dL (ref 0.5–1.6)
IGM (IMMUNOGLOBULIN M), SRM: 49 mg/dL (ref 40–230)
IgA/Immunoglobulin A, Serum: 244 mg/dL (ref 91–414)
IgG (Immunoglobin G), Serum: 880 mg/dL (ref 700–1600)
Total Protein: 6.5 g/dL (ref 6.0–8.5)

## 2014-07-09 LAB — HGB A1C W/O EAG: HEMOGLOBIN A1C: 5.2 % (ref 4.8–5.6)

## 2014-07-09 LAB — ANA W/REFLEX IF POSITIVE: Anti Nuclear Antibody(ANA): NEGATIVE

## 2014-07-09 LAB — SEDIMENTATION RATE: Sed Rate: 2 mm/hr (ref 0–40)

## 2014-07-09 LAB — C-REACTIVE PROTEIN: CRP: 3.6 mg/L (ref 0.0–4.9)

## 2014-07-09 LAB — CK: CK TOTAL: 56 U/L (ref 24–173)

## 2014-07-10 DIAGNOSIS — F332 Major depressive disorder, recurrent severe without psychotic features: Secondary | ICD-10-CM | POA: Diagnosis not present

## 2014-07-11 ENCOUNTER — Ambulatory Visit: Payer: Medicare Other

## 2014-07-11 DIAGNOSIS — R269 Unspecified abnormalities of gait and mobility: Secondary | ICD-10-CM | POA: Diagnosis not present

## 2014-07-11 DIAGNOSIS — R279 Unspecified lack of coordination: Secondary | ICD-10-CM | POA: Diagnosis not present

## 2014-07-11 NOTE — Therapy (Signed)
[facility] [address] [place], [place], [address] Phone: [phone]   Fax:  [phone]  Physical Therapy Treatment  Patient Details  Name: [name] MRN: [id number] Date of Birth: [birth year] Referring Provider:  Marin Olp, MD  Encounter Date: 07/11/2014      PT End of Session - 07/11/14 1544    Visit Number 5   Number of Visits 17   Date for PT Re-Evaluation 08/11/14   Authorization Type Medicare [employer]-G code every 10th visit   PT Start Time 1320   PT Stop Time 1400   PT Time Calculation (min) 40 min      Past Medical History  Diagnosis Date  . Osteoarthritis   . Hiatal hernia   . Chronic maxillary sinusitis     neti pot  . Depression     alone a lot  . Eustachian tube dysfunction   . Tardive dyskinesia     possibly reglan, vitamin E helps  . Mitral valve prolapse     antibiotics before dental procedures  . GERD (gastroesophageal reflux disease)   . Allergic rhinitis   . Glaucoma   . Hypothyroid   . Hypertension   . Heart murmur     hx. "MVP" -predental antibiotics  . Memory loss     short term memory loss  . Urgency of urination     some UTI in past  . Stroke     mini storkes left leg paraylsis. patient denies weakness 01/08/14.   . Cancer 07/10/13    Pancreatic cancer with MRI scan 06-19-13    Past Surgical History  Procedure Laterality Date  . Knee surgery Left     x 5, total knee Left knee  . 1 baker cyst removed    . Dilation and curettage of uterus      x3  . Lumbar spine surgery      x2  . Abdominal hysterectomy      including ovaries  . Lumbar spine surgery      cyst  . Joint replacement      LTKA  . Blepharoplasty Bilateral     with cataract surgery  . Eye surgery Right     cataract  . Eus N/A 07/10/2013    Procedure: ESOPHAGEAL ENDOSCOPIC ULTRASOUND (EUS) RADIAL;  Surgeon: Arta Silence, MD;  Location: [facility];  Service: Endoscopy;  Laterality: N/A;  .  Fine needle aspiration N/A 07/10/2013    Procedure: FINE NEEDLE ASPIRATION (FNA) LINEAR;  Surgeon: Arta Silence, MD;  Location: [facility];  Service: Endoscopy;  Laterality: N/A;  possible fna  . Back surgery      fusion  . Breast surgery      Biopsy left 2 times  . Colonoscopy w/ polypectomy      2004 last colonoscopy, no polyps  . Laparoscopy N/A 08/07/2013    Procedure: LAPAROSCOPY DIAGNOSTIC;  Surgeon: [name], MD;  Location: [place];  Service: General;  Laterality: N/A;  . Whipple procedure N/A 08/07/2013    Procedure: WHIPPLE PROCEDURE;  Surgeon: [name], MD;  Location: [place];  Service: General;  Laterality: N/A;  . Esophagogastroduodenoscopy N/A 09/11/2013    Procedure: ESOPHAGOGASTRODUODENOSCOPY (EGD);  Surgeon: Cleotis Nipper, MD;  Location: [facility] END[facility]COPY;  Service: Endoscopy;  Laterality: N/A;  Moderate sedation okay if MAC not available    There were no vitals taken for this visit.  Visit Diagnosis:  Lack of coordination  Abnormality of gait  Subjective Assessment - 07/11/14 1319    Symptoms Dr. Krista Blue ran another test but I haven't gotten the results   Currently in Pain? Yes   Pain Score 5    Pain Location Foot   Pain Orientation Right;Left       Completed Berg, TUG, and 5 minutes of standing feet together with conversation for multi-tasking cognition and balance without UE support.   Gait speed 3.05 ft/sec today.  Performed sidelying straight leg raises with MIN A 3x10 clamshells each leg with verbal cues, and MIN A for correct positioning.             [facility] Adult PT Treatment/Exercise - 07/11/14 0001    Standardized Balance Assessment   Standardized Balance Assessment Berg Balance Test;Timed Up and Go Test   Berg Balance Test   Sit to Stand Able to stand without using hands and stabilize independently   Standing Unsupported Able to stand safely 2 minutes   Sitting with Back Unsupported but Feet Supported on Floor or Stool Able to sit  safely and securely 2 minutes   Stand to Sit Sits safely with minimal use of hands   Transfers Able to transfer safely, minor use of hands   Standing Unsupported with Eyes Closed Able to stand 10 seconds safely   Standing Ubsupported with Feet Together Able to place feet together independently and stand 1 minute safely   From Standing, Reach Forward with Outstretched Arm Can reach confidently >25 cm (10")   From Standing Position, Pick up Object from Floor Able to pick up shoe safely and easily   From Standing Position, Turn to Look Behind Over each Shoulder Looks behind from both sides and weight shifts well   Turn 360 Degrees Able to turn 360 degrees safely in 4 seconds or less   Standing Unsupported, Alternately Place Feet on Step/Stool Able to stand independently and safely and complete 8 steps in 20 seconds   Standing Unsupported, One Foot in Front Able to plae foot ahead of the other independently and hold 30 seconds   Standing on One Leg Able to lift leg independently and hold 5-10 seconds   Total Score 54   Timed Up and Go Test   TUG Normal TUG   Normal TUG (seconds) 12.91                  PT Short Term Goals - 07/11/14 1330    PT SHORT TERM GOAL #1   Title Be provided with home exercise program to address balance and gait impairments, ankle instability and muscle weakness. Target 07/11/14   PT SHORT TERM GOAL #2   Title Decrease TUG time to 14.47 seconds for improved efficiency of mobility. Target 07/11/14   Status Achieved  12.91 seconds without assistive device   PT SHORT TERM GOAL #3   Title Increase Berg Balance Test to 53/56 for decreased fall risk. Target 07/11/14   Status Achieved  score 54/56 on 07/11/14   PT SHORT TERM GOAL #4   Title Decrease pt's self reported foot pain to 6/10 during therapy session. Target 07/11/14   Status Achieved  met. 5/10 on 07/11/14           PT Long Term Goals - 07/11/14 1551    PT LONG TERM GOAL #1   Title Verbalize  understanding of fall prevention education. Target 08/08/14   PT LONG TERM GOAL #2   Title Increase Berg Balance test to 55/56 for decreased fall risk. Target 08/08/14  PT LONG TERM GOAL #3   Title Decrease TUG time to >13.5 seconds for decreased fall risk; Target 08/08/14   PT LONG TERM GOAL #4   Title Reported decreased bilateral foot pain to 4/10 during therapy session.   PT LONG TERM GOAL #5   Title Increase gait speed to 2.63 ft/sec for community ambulator status with an assistive device if needed. Target 08/08/14               Plan - 07/11/14 1545    Clinical Impression Statement Pt is making progress with balance goals met, and notable improvement in gait speed. She is also reporting slight decrease in pain. Continue per plan of care. Will likely discharge in 2 weeks.   PT Next Visit Plan finish checkin STGs; gluteus medius strength        Problem List Patient Active Problem List   Diagnosis Date Noted  . Paresthesia 07/07/2014  . Lumbago 07/07/2014  . Abnormality of gait 03/27/2014  . Memory loss 01/08/2014  . 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 Other abnormal glucose 11/29/2013  . Carcinoma of head of pancreas 07/01/2013  . 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 Paresthesia of foot 06/06/2012  . Syncope 09/26/2011  . RHINOSINUSITIS, CHRONIC 02/03/2009  . COLONIC POLYPS 06/05/2008  . OSTEOARTHRITIS 06/04/2008  . DIARRHEA, CHRONIC 04/22/2008  . Low back pain 08/03/2007  . Chronic maxillary sinusitis 04/20/2007  . HYPOTHYROIDISM 02/26/2007  . HYPERLIPIDEMIA 02/26/2007  . ANXIETY 02/26/2007  . DEPRESSION 02/26/2007  . TARDIVE DYSKINESIA 02/26/2007  . GLAUCOMA 02/26/2007  . EUSTACHIAN TUBE DYSFUNCTION 02/26/2007  . HYPERTENSION 02/26/2007  . MITRAL VALVE PROLAPSE 02/26/2007  . ALLERGIC RHINITIS 02/26/2007  . GERD 02/26/2007  . HIATAL HERNIA 02/26/2007  . OSTEOPOROSIS 02/26/2007   Delrae Sawyers, PT,DPT,NCS 07/11/2014 3:53  PM Phone [phone] FAX [phone]         [facility] [address] [place], [place], [address] Phone: [phone]   Fax:  [phone]

## 2014-07-14 ENCOUNTER — Telehealth: Payer: Self-pay | Admitting: Hematology

## 2014-07-14 ENCOUNTER — Telehealth: Payer: Self-pay | Admitting: Oncology

## 2014-07-14 ENCOUNTER — Ambulatory Visit: Payer: Medicare Other | Admitting: Oncology

## 2014-07-14 ENCOUNTER — Other Ambulatory Visit: Payer: Medicare Other

## 2014-07-14 NOTE — Telephone Encounter (Signed)
Pt called to r/s due to weather, pt confirmed labs/ov, mailed out sch... KJ

## 2014-07-14 NOTE — Telephone Encounter (Signed)
Wrong encounter. 

## 2014-07-14 NOTE — Telephone Encounter (Signed)
Pt called to r/s due to weather, pt confirmed labs/ov D/T.... KJ °

## 2014-07-16 ENCOUNTER — Ambulatory Visit: Payer: Medicare Other

## 2014-07-16 DIAGNOSIS — R279 Unspecified lack of coordination: Secondary | ICD-10-CM | POA: Diagnosis not present

## 2014-07-16 DIAGNOSIS — R269 Unspecified abnormalities of gait and mobility: Secondary | ICD-10-CM

## 2014-07-16 NOTE — Patient Instructions (Signed)
Lay on your back with both feet propped. Pump ankles 30x. Then bend and straighten your right knee 30x.Perform daily.

## 2014-07-16 NOTE — Therapy (Signed)
[facility] [address] [place], [place], [address] Phone: [phone]   Fax:  [phone]  Physical Therapy Treatment  Patient Details  Name: [name] MRN: [id number] Date of Birth: Sep 10, 1940 Referring Provider:  Marin Olp, MD  Encounter Date: 07/16/2014      PT End of Session - 07/16/14 1648    Visit Number 6   Number of Visits 17   Date for PT Re-Evaluation 08/11/14   Authorization Type Medicare Triad-G code every 10th visit   PT Start Time 1406   PT Stop Time 1450   PT Time Calculation (min) 44 min      Past Medical History  Diagnosis Date  . Osteoarthritis   . Hiatal hernia   . Chronic maxillary sinusitis     neti pot  . Depression     alone a lot  . Eustachian tube dysfunction   . Tardive dyskinesia     possibly reglan, vitamin E helps  . Mitral valve prolapse     antibiotics before dental procedures  . GERD (gastroesophageal reflux disease)   . Allergic rhinitis   . Glaucoma   . Hypothyroid   . Hypertension   . Heart murmur     hx. "MVP" -predental antibiotics  . Memory loss     short term memory loss  . Urgency of urination     some UTI in past  . Stroke     mini storkes left leg paraylsis. patient denies weakness 01/08/14.   . Cancer 07/10/13    Pancreatic cancer with MRI scan 06-19-13    Past Surgical History  Procedure Laterality Date  . Knee surgery Left     x 5, total knee Left knee  . 1 baker cyst removed    . Dilation and curettage of uterus      x3  . Lumbar spine surgery      x2  . Abdominal hysterectomy      including ovaries  . Lumbar spine surgery      cyst  . Joint replacement      LTKA  . Blepharoplasty Bilateral     with cataract surgery  . Eye surgery Right     cataract  . Eus N/A 07/10/2013    Procedure: ESOPHAGEAL ENDOSCOPIC ULTRASOUND (EUS) RADIAL;  Surgeon: Arta Silence, MD;  Location: [facility];  Service: Endoscopy;  Laterality: N/A;  .  Fine needle aspiration N/A 07/10/2013    Procedure: FINE NEEDLE ASPIRATION (FNA) LINEAR;  Surgeon: Arta Silence, MD;  Location: [facility];  Service: Endoscopy;  Laterality: N/A;  possible fna  . Back surgery      fusion  . Breast surgery      Biopsy left 2 times  . Colonoscopy w/ polypectomy      2004 last colonoscopy, no polyps  . Laparoscopy N/A 08/07/2013    Procedure: LAPAROSCOPY DIAGNOSTIC;  Surgeon: [name], MD;  Location: [facility];  Service: General;  Laterality: N/A;  . Whipple procedure N/A 08/07/2013    Procedure: WHIPPLE PROCEDURE;  Surgeon: [name], MD;  Location: [facility];  Service: General;  Laterality: N/A;  . Esophagogastroduodenoscopy N/A 09/11/2013    Procedure: ESOPHAGOGASTRODUODENOSCOPY (EGD);  Surgeon: Cleotis Nipper, MD;  Location: [facility];  Service: Endoscopy;  Laterality: N/A;  Moderate sedation okay if MAC not available    There were no vitals taken for this visit.  Visit Diagnosis:  Lack of coordination  Abnormality of gait  Subjective Assessment - 07/16/14 1410    Symptoms (p) no falls or complaints   Currently in Pain? (p) Yes   Pain Location (p) Leg   Pain Orientation (p) Left;Right  feet and left knee     3x30 seconds bilateral knee to chest stretch  3x10 lumbar rocking  2x5 each leg single leg bridge with contralateral LE straight leg raise.  Pt's lower left leg noted to be discolored-slightly red, and cold to the touch.  Therex in supine with BLE elevated: ankle pumps  Manual therapy with BLE elevated in supine: nerve adhesion release technique to LLE x20 minutes then active assisted nerve glides of RLE with BLE elevated in supine.   Pt reported decreased pain and paresthesias in the RLE after session.                         PT Short Term Goals - 07/11/14 1330    PT SHORT TERM GOAL #1   Title Be provided with home exercise program to address balance and gait impairments, ankle instability and muscle  weakness. Target 07/11/14   PT SHORT TERM GOAL #2   Title Decrease TUG time to 14.47 seconds for improved efficiency of mobility. Target 07/11/14   Status Achieved  12.91 seconds without assistive device   PT SHORT TERM GOAL #3   Title Increase Berg Balance Test to 53/56 for decreased fall risk. Target 07/11/14   Status Achieved  score 54/56 on 07/11/14   PT SHORT TERM GOAL #4   Title Decrease pt's self reported foot pain to 6/10 during therapy session. Target 07/11/14   Status Achieved  met. 5/10 on 07/11/14           PT Long Term Goals - 07/11/14 1551    PT LONG TERM GOAL #1   Title Verbalize understanding of fall prevention education. Target 08/08/14   PT LONG TERM GOAL #2   Title Increase Berg Balance test to 55/56 for decreased fall risk. Target 08/08/14   PT LONG TERM GOAL #3   Title Decrease TUG time to >13.5 seconds for decreased fall risk; Target 08/08/14   PT LONG TERM GOAL #4   Title Reported decreased bilateral foot pain to 4/10 during therapy session.   PT LONG TERM GOAL #5   Title Increase gait speed to 2.63 ft/sec for community ambulator status with an assistive device if needed. Target 08/08/14               Plan - 07/16/14 1648    Clinical Impression Statement Pt noted to have RLE cutaneous nerve adhesions on antero-lateral portion of lower leg--notable by palpation and pt's reports of electrical sensation upon palpation. Responded well to nerve adhesion release and neural glides   PT Next Visit Plan finish checking STGs; gluteus medius strength; more nerve adhesion release and glides        Problem List Patient Active Problem List   Diagnosis Date Noted  . Paresthesia 07/07/2014  . Lumbago 07/07/2014  . Abnormality of gait 03/27/2014  . Memory loss 01/08/2014  . 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 Other abnormal glucose 11/29/2013  . Carcinoma of head of pancreas 07/01/2013  . 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  Paresthesia of foot 06/06/2012  . Syncope 09/26/2011  . RHINOSINUSITIS, CHRONIC 02/03/2009  . COLONIC POLYPS 06/05/2008  . OSTEOARTHRITIS 06/04/2008  . DIARRHEA, CHRONIC 04/22/2008  . Low back pain 08/03/2007  . Chronic maxillary sinusitis 04/20/2007  .  HYPOTHYROIDISM 02/26/2007  . HYPERLIPIDEMIA 02/26/2007  . ANXIETY 02/26/2007  . DEPRESSION 02/26/2007  . TARDIVE DYSKINESIA 02/26/2007  . GLAUCOMA 02/26/2007  . EUSTACHIAN TUBE DYSFUNCTION 02/26/2007  . HYPERTENSION 02/26/2007  . MITRAL VALVE PROLAPSE 02/26/2007  . ALLERGIC RHINITIS 02/26/2007  . GERD 02/26/2007  . HIATAL HERNIA 02/26/2007  . OSTEOPOROSIS 02/26/2007    Delrae Sawyers D 07/16/2014, 5:12 PM  [facility] [address] [place], [place], [address] Phone: [phone]   Fax:  [phone]

## 2014-07-23 ENCOUNTER — Encounter: Payer: Self-pay | Admitting: Physical Therapy

## 2014-07-23 ENCOUNTER — Ambulatory Visit: Payer: Medicare Other | Admitting: Physical Therapy

## 2014-07-23 DIAGNOSIS — R279 Unspecified lack of coordination: Secondary | ICD-10-CM | POA: Diagnosis not present

## 2014-07-23 DIAGNOSIS — R269 Unspecified abnormalities of gait and mobility: Secondary | ICD-10-CM | POA: Diagnosis not present

## 2014-07-23 NOTE — Therapy (Signed)
[facility] [address] [place], [place], [address] Phone: [phone]   Fax:  [phone]  Physical Therapy Treatment  Patient Details  Name: [name] MRN: [id number] Date of Birth: May 20, 1941 Referring Provider:  Marin Olp, MD  Encounter Date: 07/23/2014      PT End of Session - 07/23/14 1642    Visit Number 7   Number of Visits 17   Date for PT Re-Evaluation 08/11/14   Authorization Type Medicare Triad-G code every 10th visit   PT Start Time 1103   PT Stop Time 1142   PT Time Calculation (min) 39 min   Activity Tolerance Patient tolerated treatment well   Behavior During Therapy [facility] for tasks assessed/performed      Past Medical History  Diagnosis Date  . Osteoarthritis   . Hiatal hernia   . Chronic maxillary sinusitis     neti pot  . Depression     alone a lot  . Eustachian tube dysfunction   . Tardive dyskinesia     possibly reglan, vitamin E helps  . Mitral valve prolapse     antibiotics before dental procedures  . GERD (gastroesophageal reflux disease)   . Allergic rhinitis   . Glaucoma   . Hypothyroid   . Hypertension   . Heart murmur     hx. "MVP" -predental antibiotics  . Memory loss     short term memory loss  . Urgency of urination     some UTI in past  . Stroke     mini storkes left leg paraylsis. patient denies weakness 01/08/14.   . Cancer 07/10/13    Pancreatic cancer with MRI scan 06-19-13    Past Surgical History  Procedure Laterality Date  . Knee surgery Left     x 5, total knee Left knee  . 1 baker cyst removed    . Dilation and curettage of uterus      x3  . Lumbar spine surgery      x2  . Abdominal hysterectomy      including ovaries  . Lumbar spine surgery      cyst  . Joint replacement      LTKA  . Blepharoplasty Bilateral     with cataract surgery  . Eye surgery Right     cataract  . Eus N/A 07/10/2013    Procedure: ESOPHAGEAL ENDOSCOPIC  ULTRASOUND (EUS) RADIAL;  Surgeon: Arta Silence, MD;  Location: [facility];  Service: Endoscopy;  Laterality: N/A;  . Fine needle aspiration N/A 07/10/2013    Procedure: FINE NEEDLE ASPIRATION (FNA) LINEAR;  Surgeon: Arta Silence, MD;  Location: [facility];  Service: Endoscopy;  Laterality: N/A;  possible fna  . Back surgery      fusion  . Breast surgery      Biopsy left 2 times  . Colonoscopy w/ polypectomy      2004 last colonoscopy, no polyps  . Laparoscopy N/A 08/07/2013    Procedure: LAPAROSCOPY DIAGNOSTIC;  Surgeon: [name], MD;  Location: [facility];  Service: General;  Laterality: N/A;  . Whipple procedure N/A 08/07/2013    Procedure: WHIPPLE PROCEDURE;  Surgeon: [name], MD;  Location: [facility];  Service: General;  Laterality: N/A;  . Esophagogastroduodenoscopy N/A 09/11/2013    Procedure: ESOPHAGOGASTRODUODENOSCOPY (EGD);  Surgeon: Cleotis Nipper, MD;  Location: [facility] ENDOSCOPY;  Service: Endoscopy;  Laterality: N/A;  Moderate sedation okay if MAC not available    There were no vitals taken  for this visit.  Visit Diagnosis:  Lack of coordination      Subjective Assessment - 07/23/14 1108    Symptoms Hurt really bad over weekend 'I mean it was bad" in toes/feet and up her legs to her knees. A little better today. Back is not hurting right now.    Currently in Pain? Yes   Pain Score 5    Pain Location Foot   Pain Orientation Left;Right   Pain Descriptors / Indicators Pins and needles;Numbness   Pain Type Intractable pain;Neuropathic pain   Pain Onset More than a month ago   Pain Frequency Constant   Aggravating Factors  inproper shoe wear, activity     Treatment Exercise - plantar fascia stretch with foam bowling pin rolling on floor x 15 each foot - heel cord/calf stretch with heels off edge of bottom step 20 sec hold x 3 reps - heel/toe raises with light fingertip support x 10 reps - supine bridge 5 sec hold x 10 reps with cues on abd bracing and glut  activation - prone heel squeeze 5 sec x 10 reps - prone glut kick backs 2 sets of 10 each side   Neuro Re-ed At counter - toe walk, heel walk, and tandem walk, all forward/backwards x 3 laps each way with min guard assist and occasional UE support on counter for balance. - single leg stance 10 sec's x 1 rep each side.        PT Short Term Goals - 07/23/14 1647    PT SHORT TERM GOAL #1   Title Be provided with home exercise program to address balance and gait impairments, ankle instability and muscle weakness. Target 07/11/14   Baseline met on 07/23/14   Status Achieved   PT SHORT TERM GOAL #2   Title Decrease TUG time to 14.47 seconds for improved efficiency of mobility. Target 07/11/14   Status Achieved  12.91 seconds without assistive device   PT SHORT TERM GOAL #3   Title Increase Berg Balance Test to 53/56 for decreased fall risk. Target 07/11/14   Status Achieved  score 54/56 on 07/11/14   PT SHORT TERM GOAL #4   Title Decrease pt's self reported foot pain to 6/10 during therapy session. Target 07/11/14   Status Achieved  met. 5/10 on 07/11/14           PT Long Term Goals - 07/11/14 1551    PT LONG TERM GOAL #1   Title Verbalize understanding of fall prevention education. Target 08/08/14   PT LONG TERM GOAL #2   Title Increase Berg Balance test to 55/56 for decreased fall risk. Target 08/08/14   PT LONG TERM GOAL #3   Title Decrease TUG time to >13.5 seconds for decreased fall risk; Target 08/08/14   PT LONG TERM GOAL #4   Title Reported decreased bilateral foot pain to 4/10 during therapy session.   PT LONG TERM GOAL #5   Title Increase gait speed to 2.63 ft/sec for community ambulator status with an assistive device if needed. Target 08/08/14           Plan - 07/23/14 1643    Clinical Impression Statement Pt met her last STG today. Focues on glut med strengthening today without issues.   Rehab Potential Fair   Clinical Impairments Affecting Rehab Potential  cognitive/memory impairment   PT Frequency 2x / week   PT Duration 8 weeks   PT Treatment/Interventions ADLs/Self Care Home Management;DME Instruction;Gait training;Stair training;Neuromuscular re-education;Balance training;Therapeutic exercise;Therapeutic activities;Patient/family  education;Manual techniques   PT Next Visit Plan gluteus medius strength; more nerve adhesion release and glides   Consulted and Agree with Plan of Care Patient        Problem List Patient Active Problem List   Diagnosis Date Noted  . Paresthesia 07/07/2014  . Lumbago 07/07/2014  . Abnormality of gait 03/27/2014  . Memory loss 01/08/2014  . 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 Other abnormal glucose 11/29/2013  . Carcinoma of head of pancreas 07/01/2013  . 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 Paresthesia of foot 06/06/2012  . Syncope 09/26/2011  . RHINOSINUSITIS, CHRONIC 02/03/2009  . COLONIC POLYPS 06/05/2008  . OSTEOARTHRITIS 06/04/2008  . DIARRHEA, CHRONIC 04/22/2008  . Low back pain 08/03/2007  . Chronic maxillary sinusitis 04/20/2007  . HYPOTHYROIDISM 02/26/2007  . HYPERLIPIDEMIA 02/26/2007  . ANXIETY 02/26/2007  . DEPRESSION 02/26/2007  . TARDIVE DYSKINESIA 02/26/2007  . GLAUCOMA 02/26/2007  . EUSTACHIAN TUBE DYSFUNCTION 02/26/2007  . HYPERTENSION 02/26/2007  . MITRAL VALVE PROLAPSE 02/26/2007  . ALLERGIC RHINITIS 02/26/2007  . GERD 02/26/2007  . HIATAL HERNIA 02/26/2007  . OSTEOPOROSIS 02/26/2007    [name] 07/23/2014, 4:48 PM  [name], PTA, [facility] [address] [place],  [address] [phone] 07/23/2014, 4:48 PM

## 2014-07-25 ENCOUNTER — Encounter: Payer: Self-pay | Admitting: Physical Therapy

## 2014-07-25 ENCOUNTER — Ambulatory Visit: Payer: Medicare Other | Admitting: Physical Therapy

## 2014-07-25 DIAGNOSIS — R279 Unspecified lack of coordination: Secondary | ICD-10-CM | POA: Diagnosis not present

## 2014-07-25 DIAGNOSIS — R52 Pain, unspecified: Secondary | ICD-10-CM

## 2014-07-25 DIAGNOSIS — R6889 Other general symptoms and signs: Secondary | ICD-10-CM

## 2014-07-25 DIAGNOSIS — R269 Unspecified abnormalities of gait and mobility: Secondary | ICD-10-CM

## 2014-07-25 NOTE — Therapy (Signed)
[facility] [address] [place], [place], [address] Phone: [phone]   Fax:  [phone]  Physical Therapy Treatment  Patient Details  Name: [name] MRN: [id number] Date of Birth: September 19, 1940 Referring Provider:  Marin Olp, MD  Encounter Date: 07/25/2014      PT End of Session - 07/25/14 1015    Visit Number 8   Number of Visits 17   Date for PT Re-Evaluation 08/11/14   Authorization Type Medicare Triad-G code every 10th visit   PT Start Time 1015   PT Stop Time 1100   PT Time Calculation (min) 45 min   Activity Tolerance Patient tolerated treatment well   Behavior During Therapy [facility] for tasks assessed/performed      Past Medical History  Diagnosis Date  . Osteoarthritis   . Hiatal hernia   . Chronic maxillary sinusitis     neti pot  . Depression     alone a lot  . Eustachian tube dysfunction   . Tardive dyskinesia     possibly reglan, vitamin E helps  . Mitral valve prolapse     antibiotics before dental procedures  . GERD (gastroesophageal reflux disease)   . Allergic rhinitis   . Glaucoma   . Hypothyroid   . Hypertension   . Heart murmur     hx. "MVP" -predental antibiotics  . Memory loss     short term memory loss  . Urgency of urination     some UTI in past  . Stroke     mini storkes left leg paraylsis. patient denies weakness 01/08/14.   . Cancer 07/10/13    Pancreatic cancer with MRI scan 06-19-13    Past Surgical History  Procedure Laterality Date  . Knee surgery Left     x 5, total knee Left knee  . 1 baker cyst removed    . Dilation and curettage of uterus      x3  . Lumbar spine surgery      x2  . Abdominal hysterectomy      including ovaries  . Lumbar spine surgery      cyst  . Joint replacement      LTKA  . Blepharoplasty Bilateral     with cataract surgery  . Eye surgery Right     cataract  . Eus N/A 07/10/2013    Procedure: ESOPHAGEAL ENDOSCOPIC  ULTRASOUND (EUS) RADIAL;  Surgeon: Arta Silence, MD;  Location: [facility];  Service: Endoscopy;  Laterality: N/A;  . Fine needle aspiration N/A 07/10/2013    Procedure: FINE NEEDLE ASPIRATION (FNA) LINEAR;  Surgeon: Arta Silence, MD;  Location: [facility];  Service: Endoscopy;  Laterality: N/A;  possible fna  . Back surgery      fusion  . Breast surgery      Biopsy left 2 times  . Colonoscopy w/ polypectomy      2004 last colonoscopy, no polyps  . Laparoscopy N/A 08/07/2013    Procedure: LAPAROSCOPY DIAGNOSTIC;  Surgeon: [name], MD;  Location: [facility];  Service: General;  Laterality: N/A;  . Whipple procedure N/A 08/07/2013    Procedure: WHIPPLE PROCEDURE;  Surgeon: [name], MD;  Location: [facility];  Service: General;  Laterality: N/A;  . Esophagogastroduodenoscopy N/A 09/11/2013    Procedure: ESOPHAGOGASTRODUODENOSCOPY (EGD);  Surgeon: Cleotis Nipper, MD;  Location: [facility]COPY;  Service: Endoscopy;  Laterality: N/A;  Moderate sedation okay if MAC not available    There were no vitals taken  for this visit.  Visit Diagnosis:  Lack of coordination  Abnormality of gait  Pain of multiple sites  Decreased functional activity tolerance      Subjective Assessment - 07/25/14 1022    Symptoms Today is a bad day. Feet, back, legs are hurting.   Currently in Pain? Yes   Pain Score 7   lay flat in bed 2-3/10, standing, sitting too long 8/10     Self-care: PT instructed with patient performing sitting with LE support on 4" block for hip 90 degrees. Sitting posture. Switching LE support to adjust back.  PT discussed potential benefits of use of rollator for UE support to improve overall quality & quantity of mobility. Changing positions to decrease arthritic pain issues.  Neuromuscular Re-ed: PT giving constant tactile & verbal cues for posture & engaging core for posterior pelvic tilt. Supine: posterior pelvic tilt (PPT), PPT with hooklying hip abd /adduction, PPT with  small range assisted heel slides, PPT with hooklying small range hip flexion Sitting: PPT, PPT with dorsiflexion / toe tapping, PPT with small range hip flexion Standing with back on door frame: PPT, PPT with dorsiflexion / toe tapping, PPT with alternate stepping forward                        PT Education - 07/25/14 1015    Education provided Yes   Education Details support of rolling walker / rollator may increase function & give back support,    Person(s) Educated Patient   Methods Explanation   Comprehension Verbalized understanding          PT Short Term Goals - 07/23/14 1647    PT SHORT TERM GOAL #1   Title Be provided with home exercise program to address balance and gait impairments, ankle instability and muscle weakness. Target 07/11/14   Baseline met on 07/23/14   Status Achieved   PT SHORT TERM GOAL #2   Title Decrease TUG time to 14.47 seconds for improved efficiency of mobility. Target 07/11/14   Status Achieved  12.91 seconds without assistive device   PT SHORT TERM GOAL #3   Title Increase Berg Balance Test to 53/56 for decreased fall risk. Target 07/11/14   Status Achieved  score 54/56 on 07/11/14   PT SHORT TERM GOAL #4   Title Decrease pt's self reported foot pain to 6/10 during therapy session. Target 07/11/14   Status Achieved  met. 5/10 on 07/11/14           PT Long Term Goals - 07/11/14 1551    PT LONG TERM GOAL #1   Title Verbalize understanding of fall prevention education. Target 08/08/14   PT LONG TERM GOAL #2   Title Increase Berg Balance test to 55/56 for decreased fall risk. Target 08/08/14   PT LONG TERM GOAL #3   Title Decrease TUG time to >13.5 seconds for decreased fall risk; Target 08/08/14   PT LONG TERM GOAL #4   Title Reported decreased bilateral foot pain to 4/10 during therapy session.   PT LONG TERM GOAL #5   Title Increase gait speed to 2.63 ft/sec for community ambulator status with an assistive device if needed.  Target 08/08/14               Plan - 07/25/14 1015    Clinical Impression Statement Patient was able to engage core muscle with posterior pelvic tilt in supine, sitting & standing with back support. Patient may benefit from trial of  rollator to detemine if UE support would improve mobility without pain.   Rehab Potential Fair   Clinical Impairments Affecting Rehab Potential cognitive/memory impairment   PT Frequency 2x / week   PT Duration 8 weeks   PT Treatment/Interventions ADLs/Self Care Home Management;DME Instruction;Gait training;Stair training;Neuromuscular re-education;Balance training;Therapeutic exercise;Therapeutic activities;Patient/family education;Manual techniques   PT Next Visit Plan gluteus medius strength; more nerve adhesion release and glides   Consulted and Agree with Plan of Care Patient        Problem List Patient Active Problem List   Diagnosis Date Noted  . Paresthesia 07/07/2014  . Lumbago 07/07/2014  . Abnormality of gait 03/27/2014  . Memory loss 01/08/2014  . 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 Other abnormal glucose 11/29/2013  . Carcinoma of head of pancreas 07/01/2013  . 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 Paresthesia of foot 06/06/2012  . Syncope 09/26/2011  . RHINOSINUSITIS, CHRONIC 02/03/2009  . COLONIC POLYPS 06/05/2008  . OSTEOARTHRITIS 06/04/2008  . DIARRHEA, CHRONIC 04/22/2008  . Low back pain 08/03/2007  . Chronic maxillary sinusitis 04/20/2007  . HYPOTHYROIDISM 02/26/2007  . HYPERLIPIDEMIA 02/26/2007  . ANXIETY 02/26/2007  . DEPRESSION 02/26/2007  . TARDIVE DYSKINESIA 02/26/2007  . GLAUCOMA 02/26/2007  . EUSTACHIAN TUBE DYSFUNCTION 02/26/2007  . HYPERTENSION 02/26/2007  . MITRAL VALVE PROLAPSE 02/26/2007  . ALLERGIC RHINITIS 02/26/2007  . GERD 02/26/2007  . HIATAL HERNIA 02/26/2007  . OSTEOPOROSIS 02/26/2007    Taijah Macrae PT, DPT 07/25/2014, 11:44 PM  [facility] [address] [place], [place], [address] Phone: [phone]   Fax:  [phone]

## 2014-07-29 ENCOUNTER — Ambulatory Visit: Payer: Medicare Other

## 2014-08-05 ENCOUNTER — Telehealth: Payer: Self-pay | Admitting: *Deleted

## 2014-08-05 MED ORDER — OXCARBAZEPINE ER 150 MG PO TB24: 150.0000 mg | ORAL_TABLET | Freq: Every day | ORAL | Status: DC
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
## 2014-08-05 NOTE — Telephone Encounter (Signed)
Michelle: Please call patient, to figure out details, this is her chronic problem, I have add on oxtella xr 150mg  po qhs, warn her the increased dizziness, lightheadedness, fatigue, sleepiness,  Move up her follow-up appointment in 1 month.

## 2014-08-06 ENCOUNTER — Other Ambulatory Visit: Payer: Self-pay | Admitting: Obstetrics and Gynecology

## 2014-08-06 DIAGNOSIS — Z6826 Body mass index (BMI) 26.0-26.9, adult: Secondary | ICD-10-CM | POA: Diagnosis not present

## 2014-08-06 DIAGNOSIS — Z1231 Encounter for screening mammogram for malignant neoplasm of breast: Secondary | ICD-10-CM | POA: Diagnosis not present

## 2014-08-06 DIAGNOSIS — Z124 Encounter for screening for malignant neoplasm of cervix: Secondary | ICD-10-CM | POA: Diagnosis not present

## 2014-08-06 NOTE — Telephone Encounter (Signed)
Left messages on home and mobile numbers.

## 2014-08-07 ENCOUNTER — Encounter: Payer: Self-pay | Admitting: *Deleted

## 2014-08-07 LAB — CYTOLOGY - PAP

## 2014-08-07 NOTE — Telephone Encounter (Signed)
Duplicate task.

## 2014-08-07 NOTE — Telephone Encounter (Signed)
Patient returned [name], [place] call.  Please return call to [phone].

## 2014-08-07 NOTE — Telephone Encounter (Signed)
Agreeable to new medication - appt moved to an earlier date.

## 2014-08-08 ENCOUNTER — Ambulatory Visit: Payer: Medicare Other

## 2014-08-08 ENCOUNTER — Telehealth: Payer: Self-pay | Admitting: Neurology

## 2014-08-08 MED ORDER — OXCARBAZEPINE 150 MG PO TABS
ORAL_TABLET | ORAL | Status: DC
Start: 2014-08-08 — End: 2014-09-02

## 2014-08-08 NOTE — Telephone Encounter (Signed)
Pt calling stating that insurance will not pay for OXcarbazepine ER (OXTELLAR XR) 150 MG TB24.  Please advise.

## 2014-08-08 NOTE — Telephone Encounter (Signed)
Yes, Ok to change, order for trileptal 150mg  bid was placed.

## 2014-08-08 NOTE — Telephone Encounter (Signed)
I called ins.  They will not approve coverage for Oxtellar XR.  They indicate the preferred agent is Oxcarbazepine (generic Trileptal).  Okay to change Rx? Please advise.  Thank you.

## 2014-08-08 NOTE — Telephone Encounter (Signed)
I called the patient back and advised of the change.  She verbalized understanding.

## 2014-08-11 ENCOUNTER — Telehealth: Payer: Self-pay | Admitting: Oncology

## 2014-08-11 ENCOUNTER — Other Ambulatory Visit (HOSPITAL_BASED_OUTPATIENT_CLINIC_OR_DEPARTMENT_OTHER): Payer: Medicare Other

## 2014-08-11 ENCOUNTER — Ambulatory Visit (HOSPITAL_BASED_OUTPATIENT_CLINIC_OR_DEPARTMENT_OTHER): Payer: Medicare Other | Admitting: Oncology

## 2014-08-11 VITALS — BP 160/66 | HR 67 | Temp 98.0°F | Resp 18 | Ht 62.0 in | Wt 143.7 lb

## 2014-08-11 DIAGNOSIS — Z8507 Personal history of malignant neoplasm of pancreas: Secondary | ICD-10-CM

## 2014-08-11 DIAGNOSIS — C250 Malignant neoplasm of head of pancreas: Secondary | ICD-10-CM | POA: Diagnosis not present

## 2014-08-11 DIAGNOSIS — R109 Unspecified abdominal pain: Secondary | ICD-10-CM | POA: Diagnosis not present

## 2014-08-11 LAB — CANCER ANTIGEN 19-9: CA 19-9: 4.9 U/mL (ref <35.0)

## 2014-08-11 NOTE — Progress Notes (Signed)
  [place] OFFICE PROGRESS NOTE   Diagnosis: Pancreas cancer  INTERVAL HISTORY:   [name] returns as scheduled. She reports a good appetite. She plans to return to "square dancing ". Good appetite. No diarrhea. She reports intermittent upper abdomen and back pain. She is not taking pain medication. This pain predates the pancreas surgery. She continues pancreatic enzyme replacement. She belches frequently.  Objective:  Vital signs in last 24 hours:  Blood pressure 160/66, pulse 67, temperature 98 F (36.7 C), temperature source Oral, resp. rate 18, height 5\' 2"  (1.575 m), weight 143 lb 11.2 oz (65.182 kg), SpO2 99 %.    HEENT: Neck without mass Lymphatics: No cervical, supraclavicular, or axillary nodes. Prominent right axillary fat had. Resp: Lungs clear bilaterally Cardio: Regular rate and rhythm GI: The abdomen is soft, no mass, no hepatomegaly, no apparent ascites. Mild tenderness in the right upper quadrant. Vascular: No leg edema   Lab Results:  CA-19-9 pending   Medications: I have reviewed the patient's current medications.  Assessment/Plan: 1. 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  2. Nausea following the Whipple procedure-improved 3. Mild thrombocytopenia on a CBC 07/01/2013  4. Abdominal pain. Mild, etiology unclear.  Disposition:  [name] remains in clinical remission from pancreas cancer. I doubt the upper abdominal discomfort is related to pancreas cancer. She reports this discomfort predates surgery. She will contact us for consistent or more intense pain. We will follow-up on the CA 19-9 from  today. She will return for an office visit in 6 months. She is scheduled to see Dr. [name] in the interim.  Betsy Coder, MD  08/11/2014  10:28 AM

## 2014-08-11 NOTE — Progress Notes (Signed)
     See note 08/11/2014

## 2014-08-11 NOTE — Telephone Encounter (Signed)
Pt confirmed labs/ov per 03/14 POF, gave pt AVS.... KJ °

## 2014-08-12 ENCOUNTER — Telehealth: Payer: Self-pay | Admitting: *Deleted

## 2014-08-12 NOTE — Telephone Encounter (Signed)
Per Dr. [name]; notified pt that tumor marker is normal.  Pt verbalized understanding.

## 2014-08-12 NOTE — Telephone Encounter (Signed)
PT. THOUGHT SHE HAD A CYST BECAUSE OF THE CALL ABOUT HER TUMOR MARKER. EXPLAIN TO PT. THAT HER TUMOR MARKER(CA 19.9) IS A LAB TEST. IT IS GOOD NEWS THAT THE RESULTS ARE NORMAL. PT. VOICES UNDERSTANDING.

## 2014-08-12 NOTE — Telephone Encounter (Signed)
-----   Message from Ladell Pier, MD sent at 08/12/2014  1:54 PM EDT ----- Please call patient, tumor marker is normal

## 2014-08-22 ENCOUNTER — Other Ambulatory Visit: Payer: Self-pay | Admitting: Family Medicine

## 2014-08-25 ENCOUNTER — Telehealth: Payer: Self-pay | Admitting: Family Medicine

## 2014-08-25 MED ORDER — LEVOTHYROXINE SODIUM 175 MCG PO TABS: 175.0000 ug | ORAL_TABLET | Freq: Every day | ORAL | Status: DC

## 2014-08-25 MED ORDER — LOVASTATIN 40 MG PO TABS: 40.0000 mg | ORAL_TABLET | Freq: Every day | ORAL | Status: DC

## 2014-08-25 MED ORDER — ATENOLOL 25 MG PO TABS: 25.0000 mg | ORAL_TABLET | Freq: Every morning | ORAL | Status: DC

## 2014-08-25 NOTE — Telephone Encounter (Signed)
Medications refilled

## 2014-08-25 NOTE — Telephone Encounter (Signed)
Refill request for Atenolol & Lovastatin, send to [facility].

## 2014-08-25 NOTE — Telephone Encounter (Signed)
Pt request refill of the following:  levothyroxine (SYNTHROID, LEVOTHROID) 175 MCG tablet   Phamacy: [facility] mail order

## 2014-09-01 ENCOUNTER — Telehealth: Payer: Self-pay | Admitting: Neurology

## 2014-09-01 NOTE — Telephone Encounter (Signed)
Pt would like to come in early for worsening symptoms.   She has been placed on Megan's schedule for 09/03/14.

## 2014-09-01 NOTE — Telephone Encounter (Signed)
Patient is calling as her feet and legs are worse.  She is experiencing burning and stinging is feet and ankles.  Muscles are drawing in feet and drawing toes back.  Is there a way she can be worked in in the next day or two? Please call.

## 2014-09-02 ENCOUNTER — Ambulatory Visit (INDEPENDENT_AMBULATORY_CARE_PROVIDER_SITE_OTHER): Payer: Medicare Other | Admitting: Family Medicine

## 2014-09-02 ENCOUNTER — Encounter: Payer: Self-pay | Admitting: Family Medicine

## 2014-09-02 ENCOUNTER — Other Ambulatory Visit: Payer: Self-pay | Admitting: Family Medicine

## 2014-09-02 VITALS — BP 120/72 | HR 70 | Temp 98.1°F | Wt 146.0 lb

## 2014-09-02 DIAGNOSIS — R1013 Epigastric pain: Secondary | ICD-10-CM

## 2014-09-02 DIAGNOSIS — M546 Pain in thoracic spine: Secondary | ICD-10-CM | POA: Diagnosis not present

## 2014-09-02 DIAGNOSIS — Z1211 Encounter for screening for malignant neoplasm of colon: Secondary | ICD-10-CM

## 2014-09-02 DIAGNOSIS — Z23 Encounter for immunization: Secondary | ICD-10-CM | POA: Diagnosis not present

## 2014-09-02 LAB — COMPREHENSIVE METABOLIC PANEL
ALK PHOS: 137 U/L — AB (ref 39–117)
ALT: 36 U/L — ABNORMAL HIGH (ref 0–35)
AST: 29 U/L (ref 0–37)
Albumin: 4.3 g/dL (ref 3.5–5.2)
BUN: 16 mg/dL (ref 6–23)
CALCIUM: 9.7 mg/dL (ref 8.4–10.5)
CO2: 29 mEq/L (ref 19–32)
CREATININE: 0.78 mg/dL (ref 0.40–1.20)
Chloride: 104 mEq/L (ref 96–112)
GFR: 76.52 mL/min (ref >60.00)
GLUCOSE: 76 mg/dL (ref 70–99)
Potassium: 4.6 mEq/L (ref 3.5–5.1)
Sodium: 137 mEq/L (ref 135–145)
Total Bilirubin: 0.4 mg/dL (ref 0.2–1.2)
Total Protein: 7.1 g/dL (ref 6.0–8.3)

## 2014-09-02 LAB — CBC
HEMATOCRIT: 41.5 % (ref 36.0–46.0)
HEMOGLOBIN: 14.3 g/dL (ref 12.0–15.0)
MCHC: 34.4 g/dL (ref 30.0–36.0)
MCV: 96.1 fl (ref 78.0–100.0)
Platelets: 171 10*3/uL (ref 150.0–400.0)
RBC: 4.32 Mil/uL (ref 3.87–5.11)
RDW: 14.3 % (ref 11.5–15.5)
WBC: 8.6 10*3/uL (ref 4.0–10.5)

## 2014-09-02 NOTE — Progress Notes (Signed)
[name], MD Phone: [phone]  Subjective:   [name] is a 74 y.o. year old very pleasant female patient who presents with the following:  Thoracic back pain and epigastric abdominal pain -Abdominal pain in band across the waist more on the right and into the back as well as down the back to the areas that were previously evaluated by Dr. Krista Blue by lumbar MRI which primarily showed degenerative changes and some disc bulges. Pain over Right low back. Constant pain. Nothing makes it better or worse other than standing prolonged periods. No foods make it better or worse. . During day 5/10 aching pain. She is status post cholecystectomy. She has CT scan September of last year for lower abdominal pain which primarily only showed cystitis. She did have some old lumbar spine compression fractures which were presumed old.  Pain at least 3 months and has seen neurology and Dr. Krista Blue since this pain started. Patient is concerned that this represents recurrence of pancreatic cancer by Dr. [name] did not think this was likely.    History of low back pain with decompression sugrey by Dr. Lorin Mercy in 2004. Taking gabapentin 300mg  3x a day.   ROS- no fever/chills. Occasional nausea. No vomiting.   Past Medical History- Patient Active Problem List   Diagnosis Date Noted  . Exocrine pancreatic insufficiency 12/30/2013    Priority: High  . Carcinoma of head of pancreas 07/01/2013    Priority: High  . MITRAL VALVE PROLAPSE 02/26/2007    Priority: High  . Memory loss 01/08/2014    Priority: Medium  . Other abnormal glucose 11/29/2013    Priority: Medium  . 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 Paresthesia of foot 06/06/2012    Priority: Medium  . Syncope 09/26/2011    Priority: Medium  . HYPOTHYROIDISM 02/26/2007    Priority: Medium  . HYPERLIPIDEMIA 02/26/2007    Priority: Medium  . ANXIETY 02/26/2007    Priority: Medium  . DEPRESSION  02/26/2007    Priority: Medium  . HYPERTENSION 02/26/2007    Priority: Medium  . OSTEOPOROSIS 02/26/2007    Priority: Medium  . Abnormality of gait 03/27/2014    Priority: Low  . RHINOSINUSITIS, CHRONIC 02/03/2009    Priority: Low  . COLONIC POLYPS 06/05/2008    Priority: Low  . OSTEOARTHRITIS 06/04/2008    Priority: Low  . DIARRHEA, CHRONIC 04/22/2008    Priority: Low  . Low back pain 08/03/2007    Priority: Low  . Chronic maxillary sinusitis 04/20/2007    Priority: Low  . TARDIVE DYSKINESIA 02/26/2007    Priority: Low  . GLAUCOMA 02/26/2007    Priority: Low  . EUSTACHIAN TUBE DYSFUNCTION 02/26/2007    Priority: Low  . ALLERGIC RHINITIS 02/26/2007    Priority: Low  . GERD 02/26/2007    Priority: Low  . HIATAL HERNIA 02/26/2007    Priority: Low  . Paresthesia 07/07/2014  . Lumbago 07/07/2014   Medications- reviewed and updated Current Outpatient Prescriptions  Medication Sig Dispense Refill  . aspirin 81 MG tablet Take 81 mg by mouth at bedtime.     Marland Kitchen atenolol (TENORMIN) 25 MG tablet Take 1 tablet (25 mg total) by mouth every morning. 90 tablet 2  . carboxymethylcellulose (REFRESH TEARS) 0.5 % SOLN Place 1 drop into both eyes 2 (two) times daily.     . cholecalciferol (VITAMIN D) 1000 UNITS tablet Take 1,000 Units by mouth daily.     Marland Kitchen docusate sodium (  COLACE) 100 MG capsule Take 200 mg by mouth at bedtime.    Marland Kitchen FLUoxetine (PROZAC) 20 MG capsule Take 20 mg by mouth daily.    Marland Kitchen gabapentin (NEURONTIN) 300 MG capsule 2 tabs qid 250 capsule 11  . levothyroxine (SYNTHROID, LEVOTHROID) 175 MCG tablet Take 1 tablet by mouth  daily before breakfast 90 tablet 2  . lipase/protease/amylase (CREON) 12000 UNITS CPEP capsule Take 2 capsules (24,000 Units total) by mouth 3 (three) times daily before meals. 270 capsule 5  . loratadine (CLARITIN) 10 MG tablet Take 10 mg by mouth daily.    Marland Kitchen lovastatin (MEVACOR) 40 MG tablet Take 1 tablet (40 mg total) by mouth at bedtime. 90 tablet 2    . meclizine (ANTIVERT) 32 MG tablet Take 2 mg by mouth 3 (three) times daily as needed.    . Multiple Vitamin (MULTIVITAMIN WITH MINERALS) TABS tablet Take 1 tablet by mouth daily.    Marland Kitchen omeprazole (PRILOSEC OTC) 20 MG tablet Take 20 mg by mouth 2 (two) times daily.     . OXcarbazepine ER (OXTELLAR XR) 150 MG TB24 Take 150 mg by mouth at bedtime. 30 tablet 6  . polycarbophil (FIBERCON) 625 MG tablet Take 1,250 mg by mouth at bedtime.    Marland Kitchen rOPINIRole (REQUIP) 0.5 MG tablet One po qhs xone week, then 2 tabs po qhs 60 tablet 11  . topiramate (TOPAMAX) 200 MG tablet TAKE 1 TABLET BY MOUTH TWICE A DAY 90 tablet 2  . UNABLE TO FIND Apply 1 application topically 4 (four) times daily. Doxep/Amanta/DXM/Lidocaine 25 mg for her feet    . vitamin E 400 UNIT capsule Take 800 Units by mouth 2 (two) times daily. For tartive dyskinesia    . acetaminophen (TYLENOL) 325 MG tablet Take 650 mg by mouth every 6 (six) hours as needed for mild pain, moderate pain or fever.    Marland Kitchen aluminum & magnesium hydroxide-simethicone (MYLANTA) 500-450-40 MG/5ML suspension Take 5 mLs by mouth every 6 (six) hours as needed for indigestion.    . promethazine (PHENERGAN) 25 MG tablet Take 25 mg by mouth every 6 (six) hours as needed for nausea or vomiting.     Objective: BP 120/72 mmHg  Pulse 70  Temp(Src) 98.1 F (36.7 C)  Wt 146 lb (66.225 kg) Gen: NAD, resting comfortably on table CV: RRR no murmurs rubs or gallops Lungs: CTAB no crackles, wheeze, rhonchi Abdomen: soft/mild diffuse tenderness/midline scar/nondistended/normal bowel sounds. No rebound or guarding.  Ext: no edema Skin: warm, dry, no rash over abdomen or back Back - Normal skin, Spine with normal alignment and no deformity. Patient is tender to palpation midline throughout most of thoracic spine and less so tender in lumbar spine. Paraspinous muscles are not spasmed but do have some milder tednerness. Negative Straight leg raise. FABER with no pain on right, but  with left sided exam causes R low back pain though does not localize to faber joint.  Neuro- no saddle anesthesia, 5/5 strength lower extremities   Assessment/Plan:  Thoracic back pain and epigastric abdominal pain Cbc and cmet today largely reassuring. Abdominal exam benign. No gallbladder. Also get thoracic spine films due to pain in the back and midline tenderness. Possible compression fractures with history osteoporosis. I though abdominal u/s may be low yield and I will reach out to Dr. [name] to see if he would suggest CT abdomen/pelvis for futher information. She also has neurology follow up tomorrow and they may comment as has had back pain eval in past.  She was previously taking BID PPI and I have encouraged her to restart this as ? If GERD could cause epigastric pain. 2 week trial and 2 week follow up. She did have reflux of bile on EGD in April last year and may warrant repeat EGD.   Return precautions advised.   Orders Placed This Encounter  Procedures  . Pneumococcal conjugate vaccine 13-valent  . Ambulatory referral to Gastroenterology    Referral Priority:  Routine    Referral Type:  Consultation    Referral Reason:  Specialty Services Required    Requested Specialty:  Gastroenterology    Number of Visits Requested:  1

## 2014-09-02 NOTE — Patient Instructions (Addendum)
Received last pneumonia shot today ([id number]).  Contact insurance about Zostavax.  You will receive a call from [facility] regarding colonoscopy.   Let's check films of your mid back as well as update some labs. I am not sure of the cause of your pain. I think a 2 week trial of your omeprazole (reflux medicine) is reasonable and then see me back in 2 weeks.

## 2014-09-03 ENCOUNTER — Ambulatory Visit: Payer: Self-pay | Admitting: Adult Health

## 2014-09-03 ENCOUNTER — Ambulatory Visit (INDEPENDENT_AMBULATORY_CARE_PROVIDER_SITE_OTHER)
Admission: RE | Admit: 2014-09-03 | Discharge: 2014-09-03 | Disposition: A | Discharge status: Home / Self Care | Payer: Medicare Other | Source: Ambulatory Visit | Attending: Family Medicine | Admitting: Family Medicine

## 2014-09-03 ENCOUNTER — Ambulatory Visit (INDEPENDENT_AMBULATORY_CARE_PROVIDER_SITE_OTHER): Payer: Medicare Other | Admitting: Neurology

## 2014-09-03 ENCOUNTER — Other Ambulatory Visit: Payer: Self-pay | Admitting: Family Medicine

## 2014-09-03 ENCOUNTER — Telehealth: Payer: Self-pay | Admitting: Family Medicine

## 2014-09-03 ENCOUNTER — Encounter: Payer: Self-pay | Admitting: Neurology

## 2014-09-03 VITALS — BP 130/70 | HR 71 | Ht 62.0 in | Wt 146.0 lb

## 2014-09-03 DIAGNOSIS — R202 Paresthesia of skin: Secondary | ICD-10-CM | POA: Diagnosis not present

## 2014-09-03 DIAGNOSIS — M546 Pain in thoracic spine: Secondary | ICD-10-CM

## 2014-09-03 DIAGNOSIS — M79672 Pain in left foot: Secondary | ICD-10-CM

## 2014-09-03 DIAGNOSIS — R1013 Epigastric pain: Secondary | ICD-10-CM

## 2014-09-03 DIAGNOSIS — R269 Unspecified abnormalities of gait and mobility: Secondary | ICD-10-CM

## 2014-09-03 DIAGNOSIS — M545 Low back pain: Secondary | ICD-10-CM

## 2014-09-03 DIAGNOSIS — M79671 Pain in right foot: Secondary | ICD-10-CM

## 2014-09-03 DIAGNOSIS — M438X4 Other specified deforming dorsopathies, thoracic region: Secondary | ICD-10-CM | POA: Diagnosis not present

## 2014-09-03 MED ORDER — DULOXETINE HCL 30 MG PO CPEP: ORAL_CAPSULE | ORAL | Status: DC

## 2014-09-03 NOTE — Telephone Encounter (Signed)
Spoke with oncologist who agreed CT abd/pelvis reasonable. I have ordered this. Please inform patient.   Also, let's cancel the colonoscopy ordered yesterday. i do not think we need to proceed with this given history pancreatic cancer.   I have updated health maintenance to say she is not a candidate.

## 2014-09-03 NOTE — Telephone Encounter (Signed)
-----   Message from Ladell Pier, MD sent at 09/02/2014  9:00 PM EDT ----- I agree with a CT abd/pelvis if you are concerned the pain could be related to recurrent cancer. No clear answer, but I would probably skip the colonoscopy screening given her relatively poor prognosis from pancreas cancer.  Let me know if you want me to see her prior to next scheduled visit. ----- Message -----    From: Marin Olp, MD    Sent: 09/02/2014   6:46 PM      To: Ladell Pier, MD  Dr. [name],   Patient was having some abdominal pain when she saw you. History pancreatic cancer. Her pain has become more constant and radiates to back. History osteoporosis and compression fractures-not clear when she stopped fosamax (or how she would do with this s/p whipple). Her CA 19.9 was not elevated when you saw her. I am starting with thoracic films but wanted to see your thoughts on ct abdomen/pelvis. Also history bile reflux and considering EGD.   Also, my CMA entered for a colonoscopy for health maintenance but I was wondering if we should cancel this if you think life expectancy <10 years from pancreatic cancer history and just wanted your thoughts.   Thanks, [name]

## 2014-09-03 NOTE — Telephone Encounter (Signed)
Left message for pt to call back  °

## 2014-09-03 NOTE — Progress Notes (Signed)
[facility]  PATIENT: [name] DOB: 07-15-1940  HISTORY OF PRESENT ILLNESS:Ms. Maute, 74 year old female returns for followup. Her Primary Care physician is Dr. Yong Channel.  I have seen her over the past few years for bilateral lower extremity pain, bilateral feet paresthesia  She had past medical history of hypertension, hyperlipidemia, depression, is on polypharmacy treatment, including Prozac 20 mg every day, Ativan 3 times a day, Topamax 200 mg twice a day,   She presented with more than 10 years history of bilateral feet paresthesia, getting worse since 2010, she described bilateral feet numbness tingling, going up to her legs, her feet felt like it was broken sometimes, her feet burns, ice pack helps some, but sometimes she put on socks, wrapping tightly around her socks, which seems to help her symptoms some, her feet pain getting worse after bearing weight.  She also has a history of chronic low back pain, had a history of low back decompression surgery by Dr. Lorin Mercy in 2004, she denies shooting pain to her neck, no bowel bladder incontinence, mild gait difficulty due to feet pain, no significant weakness   Most recent laboratory evaluation showed normal vitamin B12, TSH, CMP, elevated WBC 15.7  CBC, RPR, ANA, vitamin B12, TSH, mild elevated C. reactive protein of 11.2,   She has tried compounding cream, which has made her skin peel, she is taking gabapentin 300 mg 3 times a day, which helped her 60%, also make her tired, the most bothersome symptoms at nighttime, she complains of bilateral plantar feet burning pain, she has to wrap her toes together, soak it in hot water, which only helped her temporarily.   Over the past few years, she also developed gradual worsening gait difficulty, she presented with right thoracic area pain, eventually was diagnosed with pancreatic cancer, had a Whipple procedure in March 2015, no chemo, radiation therapy  UPDATE OCt 29th  2015:  She continued to complains of bilateral feet pain, numbness, getting worse after bearing weight, she is taking up to 2400 mg of gabapentin each day, still has difficulty sleeping because her feet wake her up every couple hours, can be an help some  Worsening gait difficulty, tendency to fall, no bowel or bladder incontinence,  UPDATE Dec 1st 2015: She came in earlier as expected, because continued to have bilateral feet deep achy pain, involving whole feet, difficulty sleeping because of her feet discomfort, and deep achy pain, she has to get up in the middle of the night, using hot water, sometimes ices to ease up the discomfort, she also complains of mild unsteady gait, bilateral fingertips paresthesia  EMG nerve conduction study in June 2014, showed no large fiber peripheral neuropathy, no evidence of lumbar radiculopathy   We have reviewed MRI lumbar in November 2015, 1. At L3-4: disc bulging and facet hypertrophy with moderate biforaminal stenosis  2. At L2-3: disc bulging and facet hypertrophy with mild biforaminal stenosis  3. Posterior decompression and interbody fusion from L4-L5-S1 with metal screws and adjoining hardware. She could not tolerate nortriptyline, complains of nausea,  UPDATE Feb 8th 2016: She continues to complains of needle sticking pain at the top of her feet, feet muscle, and calf muscle spasm,  Skin biopsy was abnormal significantly decreased nerve fiber density at the right thigh, right foot, consistent with small fiber neuropathy, epidermal nerve fiber density was normal in the right calf, right foot showed normal sweat gland nerve fiber density.  UPDATE April 6th 2016: She now complains of  low back pain, around her waist, done her feet, upper along her spine, her feet burning, stinging, drawing,   She is still taking gabapentin 300mg  ii qid, failed trileptal   REVIEW OF SYSTEMS: Full 14 system review of systems performed and notable only for those  listed, all others are neg:  As above   ALLERGIES: Allergies  Allergen Reactions  . Other Other (See Comments)    According to patient Trilafor and Reglan cause Tardive Dyskinesia  . Metoclopramide Hcl Other (See Comments)    Shaking; caused tardive dyskinesia  . Niacin Other (See Comments)    shaking  . Trovan [Alatrofloxacin Mesylate] Other (See Comments)    Caused shaking and nervousness  . Nortriptyline Other (See Comments)    Dizziness   . Benzocaine-Resorcinol Rash  . Celecoxib Other (See Comments)    unknown  . Glucosamine Other (See Comments)    unknown  . Phenazopyridine Hcl Other (See Comments)    unknown  . Sulfa Antibiotics Nausea And Vomiting and Rash  . Sulfonamide Derivatives Nausea And Vomiting and Rash    HOME MEDICATIONS: Outpatient Prescriptions Prior to Visit  Medication Sig Dispense Refill  . acetaminophen (TYLENOL) 325 MG tablet Take 650 mg by mouth every 6 (six) hours as needed for mild pain, moderate pain or fever.    Marland Kitchen aluminum & magnesium hydroxide-simethicone (MYLANTA) 500-450-40 MG/5ML suspension Take 5 mLs by mouth every 6 (six) hours as needed for indigestion.    Marland Kitchen aspirin 81 MG tablet Take 81 mg by mouth at bedtime.     Marland Kitchen atenolol (TENORMIN) 25 MG tablet Take 1 tablet (25 mg total) by mouth every morning. 90 tablet 2  . carboxymethylcellulose (REFRESH TEARS) 0.5 % SOLN Place 1 drop into both eyes 2 (two) times daily.     . cholecalciferol (VITAMIN D) 1000 UNITS tablet Take 1,000 Units by mouth daily.     Marland Kitchen docusate sodium (COLACE) 100 MG capsule Take 200 mg by mouth at bedtime.    Marland Kitchen FLUoxetine (PROZAC) 20 MG capsule Take 20 mg by mouth daily.    Marland Kitchen gabapentin (NEURONTIN) 300 MG capsule 2 tabs qid 250 capsule 11  . levothyroxine (SYNTHROID, LEVOTHROID) 175 MCG tablet Take 1 tablet by mouth  daily before breakfast 90 tablet 2  . lipase/protease/amylase (CREON) 12000 UNITS CPEP capsule Take 2 capsules (24,000 Units total) by mouth 3 (three) times  daily before meals. 270 capsule 5  . loratadine (CLARITIN) 10 MG tablet Take 10 mg by mouth daily.    Marland Kitchen lovastatin (MEVACOR) 40 MG tablet Take 1 tablet (40 mg total) by mouth at bedtime. 90 tablet 2  . meclizine (ANTIVERT) 32 MG tablet Take 2 mg by mouth 3 (three) times daily as needed.    . Multiple Vitamin (MULTIVITAMIN WITH MINERALS) TABS tablet Take 1 tablet by mouth daily.    Marland Kitchen omeprazole (PRILOSEC OTC) 20 MG tablet Take 20 mg by mouth 2 (two) times daily.     . OXcarbazepine ER (OXTELLAR XR) 150 MG TB24 Take 150 mg by mouth at bedtime. 30 tablet 6  . polycarbophil (FIBERCON) 625 MG tablet Take 1,250 mg by mouth at bedtime.    . promethazine (PHENERGAN) 25 MG tablet Take 25 mg by mouth every 6 (six) hours as needed for nausea or vomiting.    Marland Kitchen rOPINIRole (REQUIP) 0.5 MG tablet One po qhs xone week, then 2 tabs po qhs 60 tablet 11  . topiramate (TOPAMAX) 200 MG tablet TAKE 1 TABLET BY MOUTH TWICE  A DAY 90 tablet 2  . UNABLE TO FIND Apply 1 application topically 4 (four) times daily. Doxep/Amanta/DXM/Lidocaine 25 mg for her feet    . vitamin E 400 UNIT capsule Take 800 Units by mouth 2 (two) times daily. For tartive dyskinesia     No facility-administered medications prior to visit.    PAST MEDICAL HISTORY: Past Medical History  Diagnosis Date  . Osteoarthritis   . Hiatal hernia   . Chronic maxillary sinusitis     neti pot  . Depression     alone a lot  . Eustachian tube dysfunction   . Tardive dyskinesia     possibly reglan, vitamin E helps  . Mitral valve prolapse     antibiotics before dental procedures  . GERD (gastroesophageal reflux disease)   . Allergic rhinitis   . Glaucoma   . Hypothyroid   . Hypertension   . Heart murmur     hx. "MVP" -predental antibiotics  . Memory loss     short term memory loss  . Urgency of urination     some UTI in past  . Stroke     mini storkes left leg paraylsis. patient denies weakness 01/08/14.   . Cancer 07/10/13    Pancreatic  cancer with MRI scan 06-19-13    PAST SURGICAL HISTORY: Past Surgical History  Procedure Laterality Date  . Knee surgery Left     x 5, total knee Left knee  . 1 baker cyst removed    . Dilation and curettage of uterus      x3  . Lumbar spine surgery      x2  . Abdominal hysterectomy      including ovaries  . Lumbar spine surgery      cyst  . Joint replacement      LTKA  . Blepharoplasty Bilateral     with cataract surgery  . Eye surgery Right     cataract  . Eus N/A 07/10/2013    Procedure: ESOPHAGEAL ENDOSCOPIC ULTRASOUND (EUS) RADIAL;  Surgeon: Arta Silence, MD;  Location: [facility];  Service: Endoscopy;  Laterality: N/A;  . Fine needle aspiration N/A 07/10/2013    Procedure: FINE NEEDLE ASPIRATION (FNA) LINEAR;  Surgeon: Arta Silence, MD;  Location: [facility];  Service: Endoscopy;  Laterality: N/A;  possible fna  . Back surgery      fusion  . Breast surgery      Biopsy left 2 times  . Colonoscopy w/ polypectomy      2004 last colonoscopy, no polyps  . Laparoscopy N/A 08/07/2013    Procedure: LAPAROSCOPY DIAGNOSTIC;  Surgeon: [name], MD;  Location: [facility];  Service: General;  Laterality: N/A;  . Whipple procedure N/A 08/07/2013    Procedure: WHIPPLE PROCEDURE;  Surgeon: [name], MD;  Location: [facility];  Service: General;  Laterality: N/A;  . Esophagogastroduodenoscopy N/A 09/11/2013    Procedure: ESOPHAGOGASTRODUODENOSCOPY (EGD);  Surgeon: Cleotis Nipper, MD;  Location[facility];  Service: Endoscopy;  Laterality: N/A;  Moderate sedation okay if MAC not available    FAMILY HISTORY: Family History  Problem Relation Age of Onset  . Cancer Mother     Breast Cancer with Metastatic disease  . Alzheimer's disease Father   . Cancer Maternal Uncle     prostate    SOCIAL HISTORY: History   Social History  . Marital Status: Widowed    Spouse Name: N/A  . Number of Children: 2  . Years of Education: 8  Occupational History  .    Marland Kitchen Retired     Social History Main Topics  . Smoking status: Never Smoker   . Smokeless tobacco: Never Used  . Alcohol Use: No  . Drug Use: No  . Sexual Activity: Not Currently   Other Topics Concern  . 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 Generalized: Well developed,  in no acute distress  Head: normocephalic and atraumatic,. Oropharynx benign  Neck: Supple, no carotid bruits ,  Neurological examination  Mentation: Alert oriented to time, place, history taking. Follows all commands speech and language fluent. Mini-Mental Status Examination is 30 out of 30  Cranial nerve II-XII: .Pupils were equal round reactive to light extraocular movements were full, visual field were full on confrontational test. Facial sensation and strength were normal. hearing was intact to finger rubbing bilaterally. Uvula tongue midline. head turning and shoulder shrug were normal and symmetric.Tongue protrusion into cheek strength was normal.  Motor: normal bulk and tone, full strength in the BUE, BLE, No focal weakness, tenderness of bilateral feet upon deep palpation  Sensory: Length dependent decreased fine touch, pinprick to mid shin level bilaterally, decreased vibratory to toes, preserved proprioception  Coordination: finger-nose-finger, heel-to-shin bilaterally, no dysmetria  Reflexes: Brachioradialis 2/2, biceps 2/2,  triceps 2/2, patellar 2/2, Achilles trace, plantar responses were flexor bilaterally.  Gait and Station: Rising up from seated position without assistance, cautious, narrow based, was able to stand on tiptoe, and heels,    DIAGNOSTIC DATA (LABS, IMAGING, TESTING) - I reviewed patient records, labs, notes, testing and imaging myself where available.  Lab Results  Component Value Date   WBC 8.6 09/02/2014   HGB 14.3 09/02/2014   HCT 41.5 09/02/2014   MCV 96.1 09/02/2014   PLT 171.0 09/02/2014      Component Value Date/Time   NA 137 09/02/2014 1458   K 4.6 09/02/2014 1458   CL 104 09/02/2014 1458   CO2 29 09/02/2014 1458   GLUCOSE 76 09/02/2014 1458   BUN 16 09/02/2014 1458   CREATININE 0.78 09/02/2014 1458   CREATININE 0.81 07/01/2013 1324   CALCIUM 9.7 09/02/2014 1458   PROT 7.1 09/02/2014 1458   PROT 6.5 07/07/2014 1246   ALBUMIN 4.3 09/02/2014 1458   AST 29 09/02/2014 1458   ALT 36* 09/02/2014 1458   ALKPHOS 137* 09/02/2014 1458   BILITOT 0.4 09/02/2014 1458   GFRNONAA 68* 02/15/2014 1445   GFRAA 79* 02/15/2014 1445    Lab Results  Component Value Date   HGBA1C 5.2 07/07/2014   Lab Results  Component Value Date   VITAMINB12 >1500* 10/29/2013   Lab Results  Component Value Date   TSH 1.50 11/27/2013  ASSESSMENT AND PLAN  75 y.o. year old female  has a past medical history of bilateral foot pain paresthesias for greater than 10 years . 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 Previous electrodiagnostic study was normal, there was no evidence of large fiber peripheral neuropathy, she is now complaining of worsening low back pain, previous lumbar decompression surgery, pancreatic cancer, status post Whipple procedure in March 2015,  1, bilateral feet paresthesia, skin biopsy proven small fiber neuropathy,  No treatable etiology found. 2. She has tried and failed multiple medications in the past, Gabapentin 600mg  qid,  Requip, Trileptal, will try cymbalta,30mg  2 tabs po qam,  3. Refer to pain management.    [facility] [address], [place], [place] [address] [phone]

## 2014-09-04 DIAGNOSIS — F332 Major depressive disorder, recurrent severe without psychotic features: Secondary | ICD-10-CM | POA: Diagnosis not present

## 2014-09-04 NOTE — Telephone Encounter (Signed)
Called pt back and left another VM tcb.

## 2014-09-04 NOTE — Telephone Encounter (Signed)
Lets call again today

## 2014-09-09 ENCOUNTER — Encounter: Payer: Self-pay | Admitting: Family Medicine

## 2014-09-09 ENCOUNTER — Ambulatory Visit: Payer: Self-pay | Admitting: Neurology

## 2014-09-09 ENCOUNTER — Ambulatory Visit (INDEPENDENT_AMBULATORY_CARE_PROVIDER_SITE_OTHER)
Admission: RE | Admit: 2014-09-09 | Discharge: 2014-09-09 | Disposition: A | Discharge status: Home / Self Care | Payer: Medicare Other | Source: Ambulatory Visit | Attending: Family Medicine | Admitting: Family Medicine

## 2014-09-09 DIAGNOSIS — K439 Ventral hernia without obstruction or gangrene: Secondary | ICD-10-CM | POA: Diagnosis not present

## 2014-09-09 DIAGNOSIS — K760 Fatty (change of) liver, not elsewhere classified: Secondary | ICD-10-CM | POA: Insufficient documentation

## 2014-09-09 DIAGNOSIS — R1013 Epigastric pain: Secondary | ICD-10-CM

## 2014-09-09 MED ORDER — IOHEXOL 300 MG/ML  SOLN
100.0000 mL | Freq: Once | INTRAMUSCULAR | Status: AC | PRN
  Administered 2014-09-09: 100 mL via INTRAVENOUS

## 2014-09-18 DIAGNOSIS — Z79899 Other long term (current) drug therapy: Secondary | ICD-10-CM | POA: Diagnosis not present

## 2014-09-18 DIAGNOSIS — M545 Low back pain: Secondary | ICD-10-CM | POA: Diagnosis not present

## 2014-09-18 DIAGNOSIS — G629 Polyneuropathy, unspecified: Secondary | ICD-10-CM | POA: Diagnosis not present

## 2014-09-18 DIAGNOSIS — M961 Postlaminectomy syndrome, not elsewhere classified: Secondary | ICD-10-CM | POA: Diagnosis not present

## 2014-09-18 DIAGNOSIS — G894 Chronic pain syndrome: Secondary | ICD-10-CM | POA: Diagnosis not present

## 2014-09-18 DIAGNOSIS — R269 Unspecified abnormalities of gait and mobility: Secondary | ICD-10-CM | POA: Diagnosis not present

## 2014-09-19 ENCOUNTER — Ambulatory Visit (INDEPENDENT_AMBULATORY_CARE_PROVIDER_SITE_OTHER): Payer: Medicare Other | Admitting: Family Medicine

## 2014-09-19 ENCOUNTER — Encounter: Payer: Self-pay | Admitting: Family Medicine

## 2014-09-19 VITALS — BP 126/82 | HR 73 | Temp 98.1°F | Wt 147.0 lb

## 2014-09-19 DIAGNOSIS — R1013 Epigastric pain: Secondary | ICD-10-CM

## 2014-09-19 DIAGNOSIS — G609 Hereditary and idiopathic neuropathy, unspecified: Secondary | ICD-10-CM

## 2014-09-19 DIAGNOSIS — M546 Pain in thoracic spine: Secondary | ICD-10-CM | POA: Diagnosis not present

## 2014-09-19 NOTE — Patient Instructions (Signed)
Not sure what was causing the increased abdominal pain but glad it is better. I thought it could be reflux but you were already taking the medicine that I had planned to increase (Prilosec which I thought you werent using as i called it omeprazole)  For right sided rib pain, would try icing 20 minutes 3x a day for 3 days then change to heat.   I think I would hold off on the spinal nerve stimulator and see if there are other options they can try first. Once you get to know the new providers, may consider this option in the future. The alternative is to simply accept that we may have some chronic level of pain.

## 2014-09-19 NOTE — Progress Notes (Signed)
  [name], MD Phone: [phone]  Subjective:   [name] is a 74 y.o. year old very pleasant female patient who presents with the following:  Patient presents today for follow up of thoracic and epigastric abdominal pain which resolved completely despite no change in therapy. Had told patient to take BID omeprazole which she states she was not taking but today she reports she was always taking BID prilosec. Son is with patient.    Patient continues to have chronic pain in low back and legs from neuropathy. Multitude of therapies tried and referred to pain management. They are considering spinal cord stimulation implants and she is very anxious about this therapy.   She also has pain along her right ribs for about 12 hours with no reported fall or injury.   ROS- no chest pain, shortness of breath, nausea, vomiting, constipation. No fecal or urinary incontinence, leg weakness.   Past Medical History- pancreatic cancer s/p whipple, chronic pain, peripheral neuropathy  Objective: BP 126/82 mmHg  Pulse 73  Temp(Src) 98.1 F (36.7 C)  Wt 147 lb (66.679 kg) Gen: NAD, resting comfortably on table CV: RRR no murmurs rubs or gallops Lungs: CTAB no crackles, wheeze, rhonchi Abdomen: soft/mild diffuse tenderness/nondistended/normal bowel sounds. No rebound or guarding. Pain over right sided lower ribs with gentle touch but specific area.  Ext: trace edema Skin: warm, dry, no rash Neuro: grossly normal, moves all extremities, normal gait   Assessment/Plan:  Unclear etiology of prior abdominal and epigastric pain. We did CT abdomen/pelvis to eval malignancy and was negative for acute cause of pain. Pain resolved on its own (was already taking therapy of BID PPI which I told her to start as thought may be GERD) without unclear origin or cause of resolution.   Has some pain today over right ribs and appears to be MSK in origin-advised conservative measures per AVS.   I do wonder if  spinal cord stimulation may help patient with neuropathy but she is going to have to build trust with providers first so I agreed with her to hold off for now and work through other potential therapies first and then consider pursuing if other measures not helpful  >50% of 20 minute office visit was spent on counseling and coordination of care

## 2014-09-29 ENCOUNTER — Telehealth: Payer: Self-pay | Admitting: Neurology

## 2014-09-29 NOTE — Telephone Encounter (Signed)
Patient stated you could call

## 2014-09-29 NOTE — Telephone Encounter (Signed)
I called back to clarify.  Spoke with patient.  Said she is having increased pain, and asked that we call [facility] and speak with them about her Rx options.  I contacted the pharmacy.  Sam (pharmacist) said the patient called them indicating she is still experiencing tingling/burning, so they would like to know if they can change the current Rx from Doxep 3% Amanta 3% DXM 2% Lidocaine 2% to all of the previously listed ingredients, but also add Clonodine 0.2% and Tramadol 2%.  If permissible, I can call them back to auth change.  Please advise.  Thank you.   (Patient said she is hesitant to go to pain management at this time because she is afraid of the procedure they want to do.)

## 2014-09-29 NOTE — Telephone Encounter (Signed)
Patient called regarding her Rx. DOXEP-AMANTA, the patient is wanting to increase the dosage on this medication and would like to speak with someone regarding this. Please call and advise.

## 2014-09-30 NOTE — Telephone Encounter (Signed)
[name], it is ok to call for changes of ingredients as suggested.

## 2014-09-30 NOTE — Telephone Encounter (Signed)
Pharmacy has been contacted and advised change is authorized.  [name] will update the Rx and they will contact patient regarding price/shipment.  They will call us back if anything further is needed.  As well, I have added this to the med list (listed as non-formulary compound cream)

## 2014-10-08 DIAGNOSIS — M549 Dorsalgia, unspecified: Secondary | ICD-10-CM | POA: Diagnosis not present

## 2014-10-08 DIAGNOSIS — Z8507 Personal history of malignant neoplasm of pancreas: Secondary | ICD-10-CM | POA: Diagnosis not present

## 2014-10-08 DIAGNOSIS — Z1211 Encounter for screening for malignant neoplasm of colon: Secondary | ICD-10-CM | POA: Diagnosis not present

## 2014-10-13 ENCOUNTER — Encounter: Payer: Self-pay | Admitting: Family Medicine

## 2014-10-15 DIAGNOSIS — Z1211 Encounter for screening for malignant neoplasm of colon: Secondary | ICD-10-CM | POA: Diagnosis not present

## 2014-10-28 ENCOUNTER — Other Ambulatory Visit: Payer: Self-pay | Admitting: General Surgery

## 2014-10-28 DIAGNOSIS — C250 Malignant neoplasm of head of pancreas: Secondary | ICD-10-CM | POA: Diagnosis not present

## 2014-10-28 DIAGNOSIS — K432 Incisional hernia without obstruction or gangrene: Secondary | ICD-10-CM

## 2014-10-28 DIAGNOSIS — K868 Other specified diseases of pancreas: Secondary | ICD-10-CM | POA: Diagnosis not present

## 2014-11-03 ENCOUNTER — Ambulatory Visit
Admission: RE | Admit: 2014-11-03 | Discharge: 2014-11-03 | Disposition: A | Discharge status: Home / Self Care | Payer: Medicare Other | Source: Ambulatory Visit | Attending: General Surgery | Admitting: General Surgery

## 2014-11-03 DIAGNOSIS — K6389 Other specified diseases of intestine: Secondary | ICD-10-CM | POA: Diagnosis not present

## 2014-11-03 DIAGNOSIS — Z8507 Personal history of malignant neoplasm of pancreas: Secondary | ICD-10-CM | POA: Diagnosis not present

## 2014-11-03 DIAGNOSIS — K432 Incisional hernia without obstruction or gangrene: Secondary | ICD-10-CM

## 2014-11-03 MED ORDER — IOHEXOL 300 MG/ML  SOLN
100.0000 mL | Freq: Once | INTRAMUSCULAR | Status: AC | PRN
  Administered 2014-11-03: 100 mL via INTRAVENOUS

## 2014-11-03 NOTE — Progress Notes (Signed)
Quick Note:  Please let patient know that she definitely has a hernia like we talked about. There is no evidence of recurrent cancer. She does have some wall thickening in her colon and likely will need a colonoscopy. I will send a message to her GI doctor to look at the scans.  ______

## 2014-11-04 ENCOUNTER — Other Ambulatory Visit: Payer: Self-pay

## 2014-11-05 DIAGNOSIS — F332 Major depressive disorder, recurrent severe without psychotic features: Secondary | ICD-10-CM | POA: Diagnosis not present

## 2014-11-07 DIAGNOSIS — Z79899 Other long term (current) drug therapy: Secondary | ICD-10-CM | POA: Diagnosis not present

## 2014-11-17 ENCOUNTER — Telehealth: Payer: Self-pay

## 2014-11-17 MED ORDER — FLUOXETINE HCL 20 MG PO CAPS: 20.0000 mg | ORAL_CAPSULE | Freq: Every day | ORAL | Status: DC

## 2014-11-17 NOTE — Telephone Encounter (Signed)
[facility] - [place], [place] - [address]: FLUoxetine (PROZAC) 20 MG capsule

## 2014-11-17 NOTE — Telephone Encounter (Signed)
Medication refilled

## 2014-11-18 DIAGNOSIS — K640 First degree hemorrhoids: Secondary | ICD-10-CM | POA: Diagnosis not present

## 2014-11-18 DIAGNOSIS — K6389 Other specified diseases of intestine: Secondary | ICD-10-CM | POA: Diagnosis not present

## 2014-11-18 DIAGNOSIS — K5730 Diverticulosis of large intestine without perforation or abscess without bleeding: Secondary | ICD-10-CM | POA: Diagnosis not present

## 2014-11-18 DIAGNOSIS — R933 Abnormal findings on diagnostic imaging of other parts of digestive tract: Secondary | ICD-10-CM | POA: Diagnosis not present

## 2014-11-18 LAB — HM COLONOSCOPY: HM COLON: NORMAL

## 2014-12-03 ENCOUNTER — Encounter: Payer: Self-pay | Admitting: Family Medicine

## 2014-12-04 ENCOUNTER — Ambulatory Visit (INDEPENDENT_AMBULATORY_CARE_PROVIDER_SITE_OTHER): Payer: Medicare Other | Admitting: Family Medicine

## 2014-12-04 ENCOUNTER — Encounter: Payer: Self-pay | Admitting: Family Medicine

## 2014-12-04 VITALS — BP 140/90 | HR 80 | Temp 98.4°F | Wt 146.0 lb

## 2014-12-04 DIAGNOSIS — R51 Headache: Secondary | ICD-10-CM

## 2014-12-04 DIAGNOSIS — R519 Headache, unspecified: Secondary | ICD-10-CM

## 2014-12-04 NOTE — Patient Instructions (Signed)
I am glad the headaches are getting better.   I do not like new headaches in patients over age 75 though.   For that reason, i want to closely follow you up and have you come in between the 18th and 22nd or come in next week if you had new (especially blurry vision, fevers, pain near your temple) or worsening symptoms and see one of my colleagues  I wonder if you are fighting off a virus given the swollen lymph node noted on the right.   You do have some neck tension so I would try a warm compress for about 5 minutes 3-4x a day.

## 2014-12-04 NOTE — Progress Notes (Signed)
[name] Reddish, MD  Subjective:  [name] is a 74 y.o. year old very pleasant female patient who presents with:  Headache -since Monday has felt somewhat tired, some congestion and some swelling of her lymph nodes on L side of neck. She has also had a headache since that time. Started in front of her head and radiated to back. Now improving slightly and seems to be settling in her left neck and around her L ear but not near temple. Described as moderate level of pain now improved to mild to moderate. Denies sick contacts.   ROS- no blurry or double vision, dysarthria, dysphagia, extremity weakness. No sinus tenderness. No chest pain or shortness of breath.   Past Medical History- Pancreatic cancer s/p whipple, mitral valve prolapse, depression, fatty liver, peripheral neuropathy, HLD, HTN, hypothyroidism  Medications- reviewed and updated Current Outpatient Prescriptions  Medication Sig Dispense Refill  . aspirin 81 MG tablet Take 81 mg by mouth at bedtime.     Marland Kitchen atenolol (TENORMIN) 25 MG tablet Take 1 tablet (25 mg total) by mouth every morning. 90 tablet 2  . carboxymethylcellulose (REFRESH TEARS) 0.5 % SOLN Place 1 drop into both eyes 2 (two) times daily.     . cholecalciferol (VITAMIN D) 1000 UNITS tablet Take 1,000 Units by mouth daily.     . cyanocobalamin 2000 MCG tablet Take 2,000 mcg by mouth daily.    Marland Kitchen docusate sodium (COLACE) 100 MG capsule Take 200 mg by mouth at bedtime.    . DULoxetine (CYMBALTA) 30 MG capsule One po qam xone week, then 2 tabs po qam 60 capsule 6  . feeding supplement (BOOST HIGH PROTEIN) LIQD Take 1 Container by mouth daily.    Marland Kitchen FLUoxetine (PROZAC) 20 MG capsule Take 1 capsule (20 mg total) by mouth daily. 90 capsule 2  . gabapentin (NEURONTIN) 300 MG capsule 2 tabs qid 250 capsule 11  . levothyroxine (SYNTHROID, LEVOTHROID) 175 MCG tablet Take 1 tablet by mouth  daily before breakfast 90 tablet 2  . lipase/protease/amylase (CREON) 12000 UNITS CPEP  capsule Take 2 capsules (24,000 Units total) by mouth 3 (three) times daily before meals. 270 capsule 5  . loratadine (CLARITIN) 10 MG tablet Take 10 mg by mouth daily.    Marland Kitchen lovastatin (MEVACOR) 40 MG tablet Take 1 tablet (40 mg total) by mouth at bedtime. 90 tablet 2  . NONFORMULARY OR COMPOUNDED ITEM Doxep 3% Amanta 3% DXM 2% Lidocaine 2% Clonodine 0.2% and Tramadol 2%    . omeprazole (PRILOSEC OTC) 20 MG tablet Take 20 mg by mouth 2 (two) times daily.     . OXcarbazepine (TRILEPTAL) 150 MG tablet Take 150 mg by mouth 2 (two) times daily.    . polycarbophil (FIBERCON) 625 MG tablet Take 1,250 mg by mouth at bedtime.    Marland Kitchen rOPINIRole (REQUIP) 0.5 MG tablet One po qhs xone week, then 2 tabs po qhs 60 tablet 11  . Simethicone 180 MG CAPS Take by mouth.    . topiramate (TOPAMAX) 200 MG tablet TAKE 1 TABLET BY MOUTH TWICE A DAY 90 tablet 2  . UNABLE TO FIND Apply 1 application topically 4 (four) times daily. Doxep/Amanta/DXM/Lidocaine 25 mg for her feet    . vitamin E 400 UNIT capsule Take 800 Units by mouth 2 (two) times daily. For tartive dyskinesia    . acetaminophen (TYLENOL) 325 MG tablet Take 650 mg by mouth every 6 (six) hours as needed for mild pain, moderate pain or fever.    Marland Kitchen  aluminum & magnesium hydroxide-simethicone (MYLANTA) 500-450-40 MG/5ML suspension Take 5 mLs by mouth every 6 (six) hours as needed for indigestion.    . meclizine (ANTIVERT) 32 MG tablet Take 2 mg by mouth 3 (three) times daily as needed.    . Multiple Vitamin (MULTIVITAMIN WITH MINERALS) TABS tablet Take 1 tablet by mouth daily.    . promethazine (PHENERGAN) 25 MG tablet Take 25 mg by mouth every 6 (six) hours as needed for nausea or vomiting.     Objective: BP 140/90 mmHg  Pulse 80  Temp(Src) 98.4 F (36.9 C)  Wt 146 lb (66.225 kg) Gen: NAD, resting comfortably in chair HEENT: nares mildly erythematous and swollen, oropharynx normal without pharyngeal exudate but mild erythema, TM normal bilaterally, Mucous  membranes are moist. NCAT. PERRLA.  <1 cm lymph node left anterior chain CV: RRR no mrg  Lungs: CTAB  Abd: soft/nontender/nondistended/normal bowel sounds  MSK: moves all extremities, no edema  Skin: warm and dry, no rash  Neuro: CN II-XII intact, sensation and reflexes normal throughout, 5/5 muscle strength in bilateral upper and lower extremities. Normal finger to nose. Normal rapid alternating movements. Normal romberg. No pronator drift.   Assessment/Plan:  Headache Suspect a viral illness has caused constellation of symptoms including headaches. Reassuring normal neuro exam. Headaches are improving at this point fortunately. Given new headache over age 21, we opted for close follow up week after next if symptoms persist or sooner if symptoms worsen. Does have some muscle tension and is to trial warm compresses- possible tension headache as well.   With new headache over 49, consider MRI with and without contrast if symptoms persist. High risk patient with history of pancreatic cancer. Last head imaging was by Ct 03/2012 after a fall. She has had extensive imaging due to cancer but none of brain since that time

## 2014-12-17 ENCOUNTER — Ambulatory Visit (INDEPENDENT_AMBULATORY_CARE_PROVIDER_SITE_OTHER): Payer: Medicare Other | Admitting: Family Medicine

## 2014-12-17 ENCOUNTER — Encounter: Payer: Self-pay | Admitting: Family Medicine

## 2014-12-17 VITALS — BP 122/84 | HR 76 | Temp 98.4°F | Wt 145.0 lb

## 2014-12-17 DIAGNOSIS — R51 Headache: Secondary | ICD-10-CM | POA: Diagnosis not present

## 2014-12-17 DIAGNOSIS — R519 Headache, unspecified: Secondary | ICD-10-CM

## 2014-12-17 NOTE — Progress Notes (Signed)
[name], MD  Subjective:  [name] is a 74 y.o. year old very pleasant female patient who presents with:  Headache Saw patient 2 weeks ago for headaches alogn with some fatigue, congestion, swelling in lymph nodes in her neck. She had a headache in that time frame on left side. Her fatigue and swelling hav eimproved. Still has light congestions. Headaches persisted for 2-3 day sthen resolved before having a few hours of similar headache a day later. Now for about a week headache free but did have about an hour long headache on right side yesterday-this was lower in intensity but still described as an ache.   ROS- no blurry or double vision, dysarthria, dysphagia, extremity weakness. No sinus tenderness. No chest pain or shortness of breath.   Past Medical History- Pancreatic cancer s/p whipple, mitral valve prolapse, depression, fatty liver, peripheral neuropathy, HLD, HTN, hypothyroidism  Medications- reviewed and updated Current Outpatient Prescriptions  Medication Sig Dispense Refill  . aspirin 81 MG tablet Take 81 mg by mouth at bedtime.     Marland Kitchen atenolol (TENORMIN) 25 MG tablet Take 1 tablet (25 mg total) by mouth every morning. 90 tablet 2  . carboxymethylcellulose (REFRESH TEARS) 0.5 % SOLN Place 1 drop into both eyes 2 (two) times daily.     . cholecalciferol (VITAMIN D) 1000 UNITS tablet Take 1,000 Units by mouth daily.     . cyanocobalamin 2000 MCG tablet Take 2,000 mcg by mouth daily.    Marland Kitchen docusate sodium (COLACE) 100 MG capsule Take 200 mg by mouth at bedtime.    . DULoxetine (CYMBALTA) 30 MG capsule One po qam xone week, then 2 tabs po qam 60 capsule 6  . feeding supplement (BOOST HIGH PROTEIN) LIQD Take 1 Container by mouth daily.    Marland Kitchen FLUoxetine (PROZAC) 20 MG capsule Take 1 capsule (20 mg total) by mouth daily. 90 capsule 2  . gabapentin (NEURONTIN) 300 MG capsule 2 tabs qid 250 capsule 11  . levothyroxine (SYNTHROID, LEVOTHROID) 175 MCG tablet Take 1 tablet by  mouth  daily before breakfast 90 tablet 2  . lipase/protease/amylase (CREON) 12000 UNITS CPEP capsule Take 2 capsules (24,000 Units total) by mouth 3 (three) times daily before meals. 270 capsule 5  . loratadine (CLARITIN) 10 MG tablet Take 10 mg by mouth daily.    Marland Kitchen lovastatin (MEVACOR) 40 MG tablet Take 1 tablet (40 mg total) by mouth at bedtime. 90 tablet 2  . Multiple Vitamin (MULTIVITAMIN WITH MINERALS) TABS tablet Take 1 tablet by mouth daily.    . NONFORMULARY OR COMPOUNDED ITEM Doxep 3% Amanta 3% DXM 2% Lidocaine 2% Clonodine 0.2% and Tramadol 2%    . omeprazole (PRILOSEC OTC) 20 MG tablet Take 20 mg by mouth 2 (two) times daily.     . polycarbophil (FIBERCON) 625 MG tablet Take 1,250 mg by mouth at bedtime.    Marland Kitchen rOPINIRole (REQUIP) 0.5 MG tablet One po qhs xone week, then 2 tabs po qhs 60 tablet 11  . Simethicone 180 MG CAPS Take by mouth.    . topiramate (TOPAMAX) 200 MG tablet TAKE 1 TABLET BY MOUTH TWICE A DAY 90 tablet 2  . UNABLE TO FIND Apply 1 application topically 4 (four) times daily. Doxep/Amanta/DXM/Lidocaine 25 mg for her feet    . vitamin E 400 UNIT capsule Take 800 Units by mouth 2 (two) times daily. For tartive dyskinesia    . acetaminophen (TYLENOL) 325 MG tablet Take 650 mg by mouth every 6 (six)  hours as needed for mild pain, moderate pain or fever.    Marland Kitchen aluminum & magnesium hydroxide-simethicone (MYLANTA) 500-450-40 MG/5ML suspension Take 5 mLs by mouth every 6 (six) hours as needed for indigestion.    . meclizine (ANTIVERT) 32 MG tablet Take 2 mg by mouth 3 (three) times daily as needed.    . OXcarbazepine (TRILEPTAL) 150 MG tablet Take 150 mg by mouth 2 (two) times daily.    . promethazine (PHENERGAN) 25 MG tablet Take 25 mg by mouth every 6 (six) hours as needed for nausea or vomiting.     Objective: BP 122/84 mmHg  Pulse 76  Temp(Src) 98.4 F (36.9 C)  Wt 145 lb (65.772 kg) Gen: NAD, resting comfortably in chair HEENT: nares mildly erythematous without  swelling, oropharynx normal without pharyngeal exudate, no longer with erythema, TM normal bilaterally, Mucous membranes are moist. NCAT. PERRLA. Lymphadenopathy resolved.  CV: RRR no mrg  Lungs: CTAB  Abd: soft/nontender/nondistended/normal bowel sounds  MSK: moves all extremities, no edema  Skin: warm and dry Neuro: CN II-XII intact, sensation and reflexes normal throughout, 5/5 muscle strength in bilateral upper and lower extremities. Normal finger to nose. Normal rapid alternating movements. Normal romberg. No pronator drift.   Assessment/Plan:  Headache Once again, suspect viral illness as original origin. We followed up due to new HA over age 13 in patient with history of pancreatic cancer. She had a short lived headache on right side different from previous left sided headache which has lasted over a week. Her neuro exam remains normal. We discussed following up only for recurring symptoms. Muscle tension in neck has resolved- may have had tension HA component.   If recurrent and per past note "With new headache over 50, consider MRI with and without contrast if symptoms persist. High risk patient with history of pancreatic cancer. Last head imaging was by Ct 03/2012 after a fall. She has had extensive imaging due to cancer but none of brain since that time "

## 2014-12-17 NOTE — Patient Instructions (Signed)
Glad headaches have resolved on the left. If you have new or recurring headaches, happy to see you back. I think everything was related to an infection you were getting over. Still some mild congestion but everything else looks great

## 2014-12-22 ENCOUNTER — Encounter: Payer: Self-pay | Admitting: Family Medicine

## 2014-12-22 ENCOUNTER — Ambulatory Visit (INDEPENDENT_AMBULATORY_CARE_PROVIDER_SITE_OTHER): Payer: Medicare Other | Admitting: Family Medicine

## 2014-12-22 VITALS — BP 132/90 | HR 79 | Temp 98.4°F | Wt 146.0 lb

## 2014-12-22 DIAGNOSIS — R51 Headache: Secondary | ICD-10-CM

## 2014-12-22 DIAGNOSIS — R519 Headache, unspecified: Secondary | ICD-10-CM

## 2014-12-22 NOTE — Patient Instructions (Signed)
Take 2 tylenol (325 or 500mg  pills) when you get home, can take another dose at 11 pm as well (if take dose at 5 pm).   Return to see [place] if new or worsening symptoms.   If your headache continues into tomorrow, call us and we will try to get you an appointment in coming weeks with neurology though once ain we could see you if new or worsening symptoms

## 2014-12-22 NOTE — Progress Notes (Addendum)
[name] Reddish, MD  Subjective:  [name] is a 74 y.o. year old very pleasant female patient who presents with:  Headache -started noon today with headache on both sides of forehead, seems to go down into left neck and into back of left neck. Has not taken anything. Nothing makes this better or worse. States 6-7/10. Feels like someone is taking a stick and poking at head. This is after having headaches several weeks ago on left side of head thought to be viral infection related. Previously denied regular headaches but does now state that she has headaches about once a month usually starting in her neck moving up to the back of her head then to front of her head 4/10 maximum and always relieved by tylenol.   ROS- no fevers, chills, vomiting. Mild nausea this morning before headaches started. No blurry vision or double vision  Past Medical History- history pancreatic cancer, mitral valve prolapse, anxiety, depression, neuropathy, HLD, HTN, hypothyroidism, memory loss  Medications- reviewed and updated Current Outpatient Prescriptions  Medication Sig Dispense Refill  . aspirin 81 MG tablet Take 81 mg by mouth at bedtime.     Marland Kitchen atenolol (TENORMIN) 25 MG tablet Take 1 tablet (25 mg total) by mouth every morning. 90 tablet 2  . carboxymethylcellulose (REFRESH TEARS) 0.5 % SOLN Place 1 drop into both eyes 2 (two) times daily.     . cholecalciferol (VITAMIN D) 1000 UNITS tablet Take 1,000 Units by mouth daily.     . cyanocobalamin 2000 MCG tablet Take 2,000 mcg by mouth daily.    Marland Kitchen docusate sodium (COLACE) 100 MG capsule Take 200 mg by mouth at bedtime.    . DULoxetine (CYMBALTA) 30 MG capsule One po qam xone week, then 2 tabs po qam 60 capsule 6  . feeding supplement (BOOST HIGH PROTEIN) LIQD Take 1 Container by mouth daily.    Marland Kitchen FLUoxetine (PROZAC) 20 MG capsule Take 1 capsule (20 mg total) by mouth daily. 90 capsule 2  . gabapentin (NEURONTIN) 300 MG capsule 2 tabs qid 250 capsule 11  .  levothyroxine (SYNTHROID, LEVOTHROID) 175 MCG tablet Take 1 tablet by mouth  daily before breakfast 90 tablet 2  . lipase/protease/amylase (CREON) 12000 UNITS CPEP capsule Take 2 capsules (24,000 Units total) by mouth 3 (three) times daily before meals. 270 capsule 5  . loratadine (CLARITIN) 10 MG tablet Take 10 mg by mouth daily.    Marland Kitchen lovastatin (MEVACOR) 40 MG tablet Take 1 tablet (40 mg total) by mouth at bedtime. 90 tablet 2  . Multiple Vitamin (MULTIVITAMIN WITH MINERALS) TABS tablet Take 1 tablet by mouth daily.    . NONFORMULARY OR COMPOUNDED ITEM Doxep 3% Amanta 3% DXM 2% Lidocaine 2% Clonodine 0.2% and Tramadol 2%    . omeprazole (PRILOSEC OTC) 20 MG tablet Take 20 mg by mouth 2 (two) times daily.     . OXcarbazepine (TRILEPTAL) 150 MG tablet Take 150 mg by mouth 2 (two) times daily.    . polycarbophil (FIBERCON) 625 MG tablet Take 1,250 mg by mouth at bedtime.    Marland Kitchen rOPINIRole (REQUIP) 0.5 MG tablet One po qhs xone week, then 2 tabs po qhs 60 tablet 11  . Simethicone 180 MG CAPS Take by mouth.    . topiramate (TOPAMAX) 200 MG tablet TAKE 1 TABLET BY MOUTH TWICE A DAY 90 tablet 2  . UNABLE TO FIND Apply 1 application topically 4 (four) times daily. Doxep/Amanta/DXM/Lidocaine 25 mg for her feet    .  vitamin E 400 UNIT capsule Take 800 Units by mouth 2 (two) times daily. For tartive dyskinesia    . acetaminophen (TYLENOL) 325 MG tablet Take 650 mg by mouth every 6 (six) hours as needed for mild pain, moderate pain or fever.    Marland Kitchen aluminum & magnesium hydroxide-simethicone (MYLANTA) 500-450-40 MG/5ML suspension Take 5 mLs by mouth every 6 (six) hours as needed for indigestion.    . meclizine (ANTIVERT) 32 MG tablet Take 2 mg by mouth 3 (three) times daily as needed.    . promethazine (PHENERGAN) 25 MG tablet Take 25 mg by mouth every 6 (six) hours as needed for nausea or vomiting.     Objective: BP 132/90 mmHg  Pulse 79  Temp(Src) 98.4 F (36.9 C)  Wt 146 lb (66.225 kg) Gen: NAD, resting  comfortably in chair HEENT: nares normal, oropharynx normal without pharyngeal exudate and no erythema, TM normal bilaterally, Mucous membranes are moist. NCAT. PERRLA. No lymphadenopathy CV: RRR no mrg  Lungs: CTAB  Abd: soft/nontender/nondistended/normal bowel sounds  MSK: moves all extremities, no edema  Skin: warm and dry, no rash  Neuro: CN II-XII intact (slight decreased hearing), sensation and reflexes normal throughout, 5/5 muscle strength in bilateral upper and lower extremities. Normal finger to nose. Normal rapid alternating movements. Normal romberg. No pronator drift.   Assessment/Plan:  Headache Patient had told me previously did not have regular headaches but today informs me she has had older headaches but current Headaches are of a new pattern and more intense than previous (this is 3rd headache at least over last month). previously had planned for MRI brain with and without contrast due to thought of new headaches in patient >50 with history pancreatic cancer. Reassuring neuro exam once again. Patient's current HA lasting 4 hours and we opted to trial tylenol today and see if gives relief as many previous headaches have responded to this. If she does not have relief or has recurrence of headaches with strong intensity, we will try to get her plugged back in with her neurology group who sees her primarily for peripheral neuropathy to weigh in on headaches and potential need for imaging/further workup.

## 2014-12-25 ENCOUNTER — Other Ambulatory Visit: Payer: Self-pay

## 2014-12-25 DIAGNOSIS — R1013 Epigastric pain: Principal | ICD-10-CM

## 2014-12-25 DIAGNOSIS — G8929 Other chronic pain: Secondary | ICD-10-CM

## 2014-12-25 NOTE — Addendum Note (Signed)
Addended by: [name] on: 12/25/2014 02:20 PM   Modules accepted: Orders

## 2014-12-30 ENCOUNTER — Other Ambulatory Visit: Payer: Self-pay

## 2014-12-30 DIAGNOSIS — K432 Incisional hernia without obstruction or gangrene: Secondary | ICD-10-CM

## 2015-01-01 ENCOUNTER — Emergency Department (HOSPITAL_COMMUNITY): Payer: Medicare Other

## 2015-01-01 ENCOUNTER — Encounter: Payer: Self-pay | Admitting: Nurse Practitioner

## 2015-01-01 ENCOUNTER — Ambulatory Visit (INDEPENDENT_AMBULATORY_CARE_PROVIDER_SITE_OTHER): Payer: Medicare Other | Admitting: Nurse Practitioner

## 2015-01-01 ENCOUNTER — Encounter (HOSPITAL_COMMUNITY): Payer: Self-pay | Admitting: *Deleted

## 2015-01-01 ENCOUNTER — Emergency Department (HOSPITAL_COMMUNITY)
Admission: EM | Admit: 2015-01-01 | Discharge: 2015-01-02 | Disposition: A | Discharge status: Home / Self Care | Payer: Medicare Other | Attending: Emergency Medicine | Admitting: Emergency Medicine

## 2015-01-01 VITALS — BP 164/82 | HR 85 | Ht 62.0 in | Wt 144.4 lb

## 2015-01-01 DIAGNOSIS — Z7982 Long term (current) use of aspirin: Secondary | ICD-10-CM | POA: Insufficient documentation

## 2015-01-01 DIAGNOSIS — S52592A Other fractures of lower end of left radius, initial encounter for closed fracture: Secondary | ICD-10-CM | POA: Diagnosis not present

## 2015-01-01 DIAGNOSIS — R202 Paresthesia of skin: Secondary | ICD-10-CM

## 2015-01-01 DIAGNOSIS — S52502A Unspecified fracture of the lower end of left radius, initial encounter for closed fracture: Secondary | ICD-10-CM | POA: Diagnosis not present

## 2015-01-01 DIAGNOSIS — Z8669 Personal history of other diseases of the nervous system and sense organs: Secondary | ICD-10-CM | POA: Insufficient documentation

## 2015-01-01 DIAGNOSIS — I10 Essential (primary) hypertension: Secondary | ICD-10-CM | POA: Insufficient documentation

## 2015-01-01 DIAGNOSIS — Y998 Other external cause status: Secondary | ICD-10-CM | POA: Diagnosis not present

## 2015-01-01 DIAGNOSIS — F329 Major depressive disorder, single episode, unspecified: Secondary | ICD-10-CM | POA: Insufficient documentation

## 2015-01-01 DIAGNOSIS — M1990 Unspecified osteoarthritis, unspecified site: Secondary | ICD-10-CM | POA: Insufficient documentation

## 2015-01-01 DIAGNOSIS — S199XXA Unspecified injury of neck, initial encounter: Secondary | ICD-10-CM | POA: Insufficient documentation

## 2015-01-01 DIAGNOSIS — Z79899 Other long term (current) drug therapy: Secondary | ICD-10-CM | POA: Insufficient documentation

## 2015-01-01 DIAGNOSIS — R011 Cardiac murmur, unspecified: Secondary | ICD-10-CM | POA: Insufficient documentation

## 2015-01-01 DIAGNOSIS — S0990XA Unspecified injury of head, initial encounter: Secondary | ICD-10-CM | POA: Insufficient documentation

## 2015-01-01 DIAGNOSIS — Z87828 Personal history of other (healed) physical injury and trauma: Secondary | ICD-10-CM | POA: Diagnosis not present

## 2015-01-01 DIAGNOSIS — K219 Gastro-esophageal reflux disease without esophagitis: Secondary | ICD-10-CM | POA: Insufficient documentation

## 2015-01-01 DIAGNOSIS — W01198A Fall on same level from slipping, tripping and stumbling with subsequent striking against other object, initial encounter: Secondary | ICD-10-CM | POA: Diagnosis not present

## 2015-01-01 DIAGNOSIS — S5292XA Unspecified fracture of left forearm, initial encounter for closed fracture: Secondary | ICD-10-CM

## 2015-01-01 DIAGNOSIS — E039 Hypothyroidism, unspecified: Secondary | ICD-10-CM | POA: Diagnosis not present

## 2015-01-01 DIAGNOSIS — Y9389 Activity, other specified: Secondary | ICD-10-CM | POA: Insufficient documentation

## 2015-01-01 DIAGNOSIS — Z87440 Personal history of urinary (tract) infections: Secondary | ICD-10-CM | POA: Diagnosis not present

## 2015-01-01 DIAGNOSIS — G609 Hereditary and idiopathic neuropathy, unspecified: Secondary | ICD-10-CM | POA: Diagnosis not present

## 2015-01-01 DIAGNOSIS — Z8507 Personal history of malignant neoplasm of pancreas: Secondary | ICD-10-CM | POA: Diagnosis not present

## 2015-01-01 DIAGNOSIS — M79632 Pain in left forearm: Secondary | ICD-10-CM | POA: Diagnosis not present

## 2015-01-01 DIAGNOSIS — S52572A Other intraarticular fracture of lower end of left radius, initial encounter for closed fracture: Secondary | ICD-10-CM | POA: Insufficient documentation

## 2015-01-01 DIAGNOSIS — Z8673 Personal history of transient ischemic attack (TIA), and cerebral infarction without residual deficits: Secondary | ICD-10-CM | POA: Diagnosis not present

## 2015-01-01 DIAGNOSIS — Y92009 Unspecified place in unspecified non-institutional (private) residence as the place of occurrence of the external cause: Secondary | ICD-10-CM | POA: Insufficient documentation

## 2015-01-01 DIAGNOSIS — S6992XA Unspecified injury of left wrist, hand and finger(s), initial encounter: Secondary | ICD-10-CM | POA: Diagnosis present

## 2015-01-01 MED ORDER — HYDROCODONE-ACETAMINOPHEN 5-325 MG PO TABS
1.0000 | ORAL_TABLET | Freq: Once | ORAL | Status: AC
Start: 2015-01-01 — End: 2015-01-01
  Administered 2015-01-01: 1 via ORAL
  Filled 2015-01-01: qty 1

## 2015-01-01 MED ORDER — GABAPENTIN 400 MG PO CAPS: 800.0000 mg | ORAL_CAPSULE | Freq: Four times a day (QID) | ORAL | Status: DC

## 2015-01-01 NOTE — ED Notes (Addendum)
Pt states she tripped when she was bringing groceries in the house. States she landed on her L side and on her nose. Denies loc, but states frontal headache, L knee pain L side pain, L wrist pain and swelling, neck pain from ear to ear, and L back pain. C-collar placed in triage.

## 2015-01-01 NOTE — Patient Instructions (Addendum)
Will increase gabapentin to 400 mg 2 tablets 4 times daily Continue Requip, Trileptal, Cymbalta, and Topamax and compounding cream Follow-up in 6-8 months

## 2015-01-01 NOTE — Progress Notes (Signed)
[facility]  [name]: [name] DOB: 02-22-1941   REASON FOR VISIT: follow-up for foot paresthesias,small fiber neuropathy HISTORY FROM:[name]    HISTORY OF PRESENT ILLNESS:[name], 74 year old female returns for followup. Her Primary Care physician is Dr. Yong Channel. She continues to I have seen her over the past few years for bilateral lower extremity pain, bilateral feet paresthesia She had past medical history of hypertension, hyperlipidemia, depression, is on polypharmacy treatment, including Prozac 20 mg every day, Ativan 3 times a day, Topamax 200 mg twice a day,  SRichardhe presented with more than 10 years history of bilateral feet paresthesia, getting worse since 2010, she described bilateral feet numbness tingling, going up to her legs, her feet felt like it was broken sometimes, her feet burns, ice pack helps some, but sometimes she put on socks, wrapping tightly around her socks, which seems to help her symptoms some, her feet pain getting worse after bearing weight.  She also has a history of chronic low back pain, had a history of low back decompression surgery by Dr. Lorin Mercy in 2004, she denies shooting pain to her neck, no bowel bladder incontinence, mild gait difficulty due to feet pain, no significant weakness  Most recent laboratory evaluation showed normal vitamin B12, TSH, CMP, elevated WBC 15.7 CBC, RPR, ANA, vitamin B12, TSH, mild elevated C. reactive protein of 11.2,  She has tried compounding cream, which has made her skin peel, she is taking gabapentin 300 mg 3 times a day, which helped her 60%, also make her tired, the most bothersome symptoms at nighttime, she complains of bilateral plantar feet burning pain, she has to wrap her toes together, soak it in hot water, which only helped her temporarily.  Over the past few years, she also developed gradual worsening gait difficulty, she presented with right thoracic area pain, eventually was  diagnosed with pancreatic cancer, had a Whipple procedure in March 2015, no chemo, radiation therapy  EMG nerve conduction study in June 2014, showed no large fiber peripheral neuropathy, no evidence of lumbar radiculopathy  We have reviewed MRI lumbar in November 2015, 1. At L3-4: disc bulging and facet hypertrophy with moderate biforaminal stenosis  2. At L2-3: disc bulging and facet hypertrophy with mild biforaminal stenosis  3. Posterior decompression and interbody fusion from L4-L5-S1 with metal screws and adjoining hardware. She could not tolerate nortriptyline, complains of nausea,  UPDATE Feb 8th 2016: She continues to complains of needle sticking pain at the top of her feet, feet muscle, and calf muscle spasm,  Skin biopsy was abnormal significantly decreased nerve fiber density at the right thigh, right foot, consistent with small fiber neuropathy, epidermal nerve fiber density was normal in the right calf, right foot showed normal sweat gland nerve fiber density.  UPDATE   01/01/2015 [name], 74 year old female returns for follow-up. She has a long history of small fiber neuropathy and continues to complain burning in the top of her feet and muscle spasms in the calf. She has been to pain management since last seen by Dr. Krista Blue on  April 6th 2016. Neuro stimulation was discussed with the [name] however she is reluctant to do this. She remains on Requip Trileptal, Topamax and Cymbalta. She is also on a compounding cream. She denies any falls. She does not use an assistive device, she has problems with her housework and cooking because she is not able to stand on her feet for long periods of time to do these things. She returns for  reevaluation     REVIEW OF SYSTEMS: Full 14 system review of systems performed and notable only for those listed, all others are neg:  Constitutional: neg  Cardiovascular: neg Ear/Nose/Throat: neg  Skin: neg Eyes: neg Respiratory:  neg Gastroitestinal: n Urinary frequency Hematology/Lymphatic: neg  Endocrine: neg Musculoskeletal: muscle cramps Allergy/Immunology: neg Neurological:  Occasional headache Psychiatric: neg Sleep : neg   ALLERGIES: Allergies  Allergen Reactions  . Other Other (See Comments)    According to [name] Trilafor and Reglan cause Tardive Dyskinesia  . Metoclopramide Hcl Other (See Comments)    Shaking; caused tardive dyskinesia  . Niacin Other (See Comments)    shaking  . Trovan [Alatrofloxacin Mesylate] Other (See Comments)    Caused shaking and nervousness  . Nortriptyline Other (See Comments)    Dizziness   . Benzocaine-Resorcinol Rash  . Celecoxib Other (See Comments)    unknown  . Glucosamine Other (See Comments)    unknown  . Phenazopyridine Hcl Other (See Comments)    unknown  . Sulfa Antibiotics Nausea And Vomiting and Rash  . Sulfonamide Derivatives Nausea And Vomiting and Rash    HOME MEDICATIONS: Outpatient Prescriptions Prior to Visit  Medication Sig Dispense Refill  . acetaminophen (TYLENOL) 325 MG tablet Take 650 mg by mouth every 6 (six) hours as needed for mild pain, moderate pain or fever.    Marland Kitchen aluminum & magnesium hydroxide-simethicone (MYLANTA) 500-450-40 MG/5ML suspension Take 5 mLs by mouth every 6 (six) hours as needed for indigestion.    Marland Kitchen aspirin 81 MG tablet Take 81 mg by mouth at bedtime.     Marland Kitchen atenolol (TENORMIN) 25 MG tablet Take 1 tablet (25 mg total) by mouth every morning. 90 tablet 2  . carboxymethylcellulose (REFRESH TEARS) 0.5 % SOLN Place 1 drop into both eyes 2 (two) times daily.     . cholecalciferol (VITAMIN D) 1000 UNITS tablet Take 1,000 Units by mouth daily.     . cyanocobalamin 2000 MCG tablet Take 2,000 mcg by mouth daily.    Marland Kitchen docusate sodium (COLACE) 100 MG capsule Take 200 mg by mouth at bedtime.    . DULoxetine (CYMBALTA) 30 MG capsule One po qam xone week, then 2 tabs po qam 60 capsule 6  . feeding supplement (BOOST HIGH PROTEIN)  LIQD Take 1 Container by mouth daily.    Marland Kitchen FLUoxetine (PROZAC) 20 MG capsule Take 1 capsule (20 mg total) by mouth daily. 90 capsule 2  . gabapentin (NEURONTIN) 300 MG capsule 2 tabs qid 250 capsule 11  . levothyroxine (SYNTHROID, LEVOTHROID) 175 MCG tablet Take 1 tablet by mouth  daily before breakfast 90 tablet 2  . lipase/protease/amylase (CREON) 12000 UNITS CPEP capsule Take 2 capsules (24,000 Units total) by mouth 3 (three) times daily before meals. 270 capsule 5  . loratadine (CLARITIN) 10 MG tablet Take 10 mg by mouth daily.    Marland Kitchen lovastatin (MEVACOR) 40 MG tablet Take 1 tablet (40 mg total) by mouth at bedtime. 90 tablet 2  . meclizine (ANTIVERT) 32 MG tablet Take 2 mg by mouth 3 (three) times daily as needed.    . Multiple Vitamin (MULTIVITAMIN WITH MINERALS) TABS tablet Take 1 tablet by mouth daily.    . NONFORMULARY OR COMPOUNDED ITEM Doxep 3% Amanta 3% DXM 2% Lidocaine 2% Clonodine 0.2% and Tramadol 2%    . omeprazole (PRILOSEC OTC) 20 MG tablet Take 20 mg by mouth 2 (two) times daily.     . OXcarbazepine (TRILEPTAL) 150 MG tablet Take 150  mg by mouth 2 (two) times daily.    . polycarbophil (FIBERCON) 625 MG tablet Take 1,250 mg by mouth at bedtime.    . promethazine (PHENERGAN) 25 MG tablet Take 25 mg by mouth every 6 (six) hours as needed for nausea or vomiting.    Marland Kitchen rOPINIRole (REQUIP) 0.5 MG tablet One po qhs xone week, then 2 tabs po qhs 60 tablet 11  . Simethicone 180 MG CAPS Take by mouth.    . topiramate (TOPAMAX) 200 MG tablet TAKE 1 TABLET BY MOUTH TWICE A DAY 90 tablet 2  . UNABLE TO FIND Apply 1 application topically 4 (four) times daily. Doxep/Amanta/DXM/Lidocaine 25 mg for her feet    . vitamin E 400 UNIT capsule Take 800 Units by mouth 2 (two) times daily. For tartive dyskinesia     No facility-administered medications prior to visit.    PAST MEDICAL HISTORY: Past Medical History  Diagnosis Date  . Osteoarthritis   . Hiatal hernia   . Chronic maxillary sinusitis      neti pot  . Depression     alone a lot  . Eustachian tube dysfunction   . Tardive dyskinesia     possibly reglan, vitamin E helps  . Mitral valve prolapse     antibiotics before dental procedures  . GERD (gastroesophageal reflux disease)   . Allergic rhinitis   . Glaucoma   . Hypothyroid   . Hypertension   . Heart murmur     hx. "MVP" -predental antibiotics  . Memory loss     short term memory loss  . Urgency of urination     some UTI in past  . Stroke     mini storkes left leg paraylsis. [name] denies weakness 01/08/14.   . Cancer 07/10/13    Pancreatic cancer with MRI scan 06-19-13    PAST SURGICAL HISTORY: Past Surgical History  Procedure Laterality Date  . Knee surgery Left     x 5, total knee Left knee  . 1 baker cyst removed    . Dilation and curettage of uterus      x3  . Lumbar spine surgery      x2  . Abdominal hysterectomy      including ovaries  . Lumbar spine surgery      cyst  . Joint replacement      LTKA  . Blepharoplasty Bilateral     with cataract surgery  . Eye surgery Right     cataract  . Eus N/A 07/10/2013    Procedure: ESOPHAGEAL ENDOSCOPIC ULTRASOUND (EUS) RADIAL;  Surgeon: Arta Silence, MD;  Location: [facility];  Service: Endoscopy;  Laterality: N/A;  . Fine needle aspiration N/A 07/10/2013    Procedure: FINE NEEDLE ASPIRATION (FNA) LINEAR;  Surgeon: Arta Silence, MD;  Location: [facility];  Service: Endoscopy;  Laterality: N/A;  possible fna  . Back surgery      fusion  . Breast surgery      Biopsy left 2 times  . Colonoscopy w/ polypectomy      2004 last colonoscopy, no polyps  . Laparoscopy N/A 08/07/2013    Procedure: LAPAROSCOPY DIAGNOSTIC;  Surgeon: [name], MD;  Location: [facility];  Service: General;  Laterality: N/A;  . Whipple procedure N/A 08/07/2013    Procedure: WHIPPLE PROCEDURE;  Surgeon: [name], MD;  Location: [facility];  Service: General;  Laterality: N/A;  . Esophagogastroduodenoscopy N/A 09/11/2013     Procedure: ESOPHAGOGASTRODUODENOSCOPY (EGD);  Surgeon: Cleotis Nipper, MD;  Location: [facility];  Service: Endoscopy;  Laterality: N/A;  Moderate sedation okay if MAC not available    FAMILY HISTORY: Family History  Problem Relation Age of Onset  . Cancer Mother     Breast Cancer with Metastatic disease  . Alzheimer's disease Father   . Cancer Maternal Uncle     prostate    SOCIAL HISTORY: History   Social History  . Marital Status: Widowed    Spouse Name: N/A  . Number of Children: 2  . Years of Education: 8   Occupational History  .    Marland Kitchen Retired    Social History Main Topics  . Smoking status: Never Smoker   . Smokeless tobacco: Never Used  . Alcohol Use: No  . Drug Use: No  . Sexual Activity: Not Currently   Other Topics Concern  . 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 Generalized: Well developed, in no acute distress  Head: normocephalic and atraumatic,. Oropharynx benign  Neck: Supple, no carotid bruits ,  Neurological examination  Mentation: Alert oriented to time, place, history taking. Follows all commands speech and language fluent.  Cranial nerve II-XII: .Pupils were equal round reactive to light extraocular movements were full, visual field were full on confrontational test. Facial sensation and strength were normal. hearing was intact to finger  rubbing bilaterally. Uvula tongue midline. head turning and shoulder shrug were normal and symmetric.Tongue protrusion into cheek strength was normal.  Motor: normal bulk and tone, full strength in the BUE, BLE, No focal weakness, tenderness of bilateral feet upon deep palpation  Sensory: Length dependent decreased fine touch, pinprick to mid shin level bilaterally, decreased vibratory to toes, preserved proprioception  Coordination: finger-nose-finger, heel-to-shin bilaterally, no dysmetria  Reflexes: Brachioradialis 2/2, biceps 2/2, triceps 2/2, patellar 2/2, Achilles trace, plantar responses were flexor bilaterally.  Gait and Station: Rising up from seated position without assistance, cautious, narrow based, was able to stand on tiptoe, and heels, tandem gait is unsteady no assistive device    DIAGNOSTIC DATA (LABS, IMAGING, TESTING) - I reviewed [name] records, labs, notes, testing and imaging myself where available.  Lab Results  Component Value Date   WBC 8.6 09/02/2014   HGB 14.3 09/02/2014   HCT 41.5 09/02/2014   MCV 96.1 09/02/2014   PLT 171.0 09/02/2014      Component Value Date/Time   NA 137 09/02/2014 1458   K 4.6 09/02/2014 1458   CL 104 09/02/2014 1458   CO2 29 09/02/2014 1458   GLUCOSE 76 09/02/2014 1458   BUN 16 09/02/2014 1458   CREATININE 0.78 09/02/2014 1458   CREATININE 0.81 07/01/2013 1324   CALCIUM 9.7 09/02/2014 1458   PROT  7.1 09/02/2014 1458   PROT 6.5 07/07/2014 1246   ALBUMIN 4.3 09/02/2014 1458   AST 29 09/02/2014 1458   ALT 36* 09/02/2014 1458   ALKPHOS 137* 09/02/2014 1458   BILITOT 0.4 09/02/2014 1458   GFRNONAA 68* 02/15/2014 1445   GFRAA 79* 02/15/2014 1445    Lab Results  Component Value Date   HGBA1C 5.2 07/07/2014    ASSESSMENT AND PLAN  74 y.o. year old female  has a past medical history of bilateral foot paresthesias for greater than 10 years. Mild length dependent sensory changes most consistent with small fiber neuropathy.  Previous  Electrodiagnostic study was normal without evidence of large fiber peripheral neuropathy. She also complains of low back pain had previous lumbar decompression surgery. Skin biopsy with  small fiber neuropathy.  Will increase gabapentin to 400 mg 2 tablets 4 times daily RX to [name] Continue Requip, Trileptal, Cymbalta, and Topamax and compounding cream  I spent additional 10 minutes in total face to face time with the [name] more than 50% of which was spent counseling and coordination of care, reviewing test results reviewing medications and discussing and reviewing the diagnosis of small fiber neuropathy and the report from [facility] which recommended neuro stimulation. [name] is reluctant to do this Follow-up in 6-8 months Dennie Bible, [employer], [facility], APRN  [facility] [address] [place], [place] [address] [phone]

## 2015-01-02 ENCOUNTER — Other Ambulatory Visit: Payer: Self-pay

## 2015-01-02 DIAGNOSIS — S52572A Other intraarticular fracture of lower end of left radius, initial encounter for closed fracture: Secondary | ICD-10-CM | POA: Diagnosis not present

## 2015-01-02 DIAGNOSIS — K432 Incisional hernia without obstruction or gangrene: Secondary | ICD-10-CM

## 2015-01-02 MED ORDER — HYDROCODONE-ACETAMINOPHEN 5-325 MG PO TABS: 1.0000 | ORAL_TABLET | ORAL | Status: DC | PRN

## 2015-01-02 MED ORDER — HYDROCODONE-ACETAMINOPHEN 5-325 MG PO TABS
1.0000 | ORAL_TABLET | Freq: Once | ORAL | Status: AC
  Administered 2015-01-02: 1 via ORAL
  Filled 2015-01-02: qty 1

## 2015-01-02 NOTE — Progress Notes (Signed)
I have reviewed and agreed above plan. 

## 2015-01-02 NOTE — ED Provider Notes (Signed)
CSN: [id number]     Arrival date & time 01/01/15  1904 History   First MD Initiated Contact with Patient 01/01/15 2145     Chief Complaint  Patient presents with  . Fall     (Consider location/radiation/quality/duration/timing/severity/associated sxs/prior Treatment) HPI Comments: 74 year old female with past medical history outlined below who presents with with fall. Just prior to arrival, the patient was bringing groceries into the house when she tripped. She fell forward, striking her left side and her face including her nose. She did not lose consciousness. She currently endorses a mild frontal headache, generalized neck pain partially due to the discomfort of the c-collar, mild left knee pain, and significant left wrist pain. She denies any chest or abdominal pain. No difficulty breathing. No significant pain in her hips.  Patient is a 74 y.o. female presenting with fall. The history is provided by the patient.  Fall    Past Medical History  Diagnosis Date  . Osteoarthritis   . Hiatal hernia   . Chronic maxillary sinusitis     neti pot  . Depression     alone a lot  . Eustachian tube dysfunction   . Tardive dyskinesia     possibly reglan, vitamin E helps  . Mitral valve prolapse     antibiotics before dental procedures  . GERD (gastroesophageal reflux disease)   . Allergic rhinitis   . Glaucoma   . Hypothyroid   . Hypertension   . Heart murmur     hx. "MVP" -predental antibiotics  . Memory loss     short term memory loss  . Urgency of urination     some UTI in past  . Stroke     mini storkes left leg paraylsis. patient denies weakness 01/08/14.   . Cancer 07/10/13    Pancreatic cancer with MRI scan 06-19-13   Past Surgical History  Procedure Laterality Date  . Knee surgery Left     x 5, total knee Left knee  . 1 baker cyst removed    . Dilation and curettage of uterus      x3  . Lumbar spine surgery      x2  . Abdominal hysterectomy      including ovaries  .  Lumbar spine surgery      cyst  . Joint replacement      LTKA  . Blepharoplasty Bilateral     with cataract surgery  . Eye surgery Right     cataract  . Eus N/A 07/10/2013    Procedure: ESOPHAGEAL ENDOSCOPIC ULTRASOUND (EUS) RADIAL;  Surgeon: Arta Silence, MD;  Location: [facility];  Service: Endoscopy;  Laterality: N/A;  . Fine needle aspiration N/A 07/10/2013    Procedure: FINE NEEDLE ASPIRATION (FNA) LINEAR;  Surgeon: Arta Silence, MD;  Location: [facility];  Service: Endoscopy;  Laterality: N/A;  possible fna  . Back surgery      fusion  . Breast surgery      Biopsy left 2 times  . Colonoscopy w/ polypectomy      2004 last colonoscopy, no polyps  . Laparoscopy N/A 08/07/2013    Procedure: LAPAROSCOPY DIAGNOSTIC;  Surgeon: [name], MD;  Location: [facility];  Service: General;  Laterality: N/A;  . Whipple procedure N/A 08/07/2013    Procedure: WHIPPLE PROCEDURE;  Surgeon: [name], MD;  Location: [facility];  Service: General;  Laterality: N/A;  . Esophagogastroduodenoscopy N/A 09/11/2013    Procedure: ESOPHAGOGASTRODUODENOSCOPY (EGD);  Surgeon: Cleotis Nipper, MD;  Location:  [facility];  Service: Endoscopy;  Laterality: N/A;  Moderate sedation okay if MAC not available   Family History  Problem Relation Age of Onset  . Cancer Mother     Breast Cancer with Metastatic disease  . Alzheimer's disease Father   . Cancer Maternal Uncle     prostate   History  Substance Use Topics  . Smoking status: Never Smoker   . Smokeless tobacco: Never Used  . Alcohol Use: No   OB History    No data available     Review of Systems 10 Systems reviewed and are negative for acute change except as noted in the HPI.    Allergies  Other; Metoclopramide hcl; Niacin; Trovan; Nortriptyline; Benzocaine-resorcinol; Celecoxib; Glucosamine; Phenazopyridine hcl; Sulfa antibiotics; and Sulfonamide derivatives  Home Medications   Prior to Admission medications   Medication Sig Start Date  End Date Taking? Authorizing Provider  acetaminophen (TYLENOL) 325 MG tablet Take 650 mg by mouth every 6 (six) hours as needed for mild pain, moderate pain or fever.   Yes Historical Provider, MD  aluminum & magnesium hydroxide-simethicone (MYLANTA) 500-450-40 MG/5ML suspension Take 5 mLs by mouth every 6 (six) hours as needed for indigestion.   Yes Historical Provider, MD  aspirin 81 MG tablet Take 81 mg by mouth at bedtime.    Yes Historical Provider, MD  atenolol (TENORMIN) 25 MG tablet Take 1 tablet (25 mg total) by mouth every morning. 08/25/14  Yes Marin Olp, MD  carboxymethylcellulose (REFRESH TEARS) 0.5 % SOLN Place 1 drop into both eyes 2 (two) times daily.    Yes Historical Provider, MD  cholecalciferol (VITAMIN D) 1000 UNITS tablet Take 1,000 Units by mouth 2 (two) times daily.    Yes Historical Provider, MD  cyanocobalamin 2000 MCG tablet Take 2,000 mcg by mouth daily at 12 noon.    Yes Historical Provider, MD  docusate sodium (COLACE) 100 MG capsule Take 200 mg by mouth at bedtime.   Yes Historical Provider, MD  DULoxetine (CYMBALTA) 30 MG capsule One po qam xone week, then 2 tabs po qam Patient taking differently: Take 60 mg by mouth at bedtime. One po qam xone week, then 2 tabs po qam 09/03/14  Yes Marcial Pacas, MD  feeding supplement (BOOST HIGH PROTEIN) LIQD Take 1 Container by mouth daily.   Yes Historical Provider, MD  FLUoxetine (PROZAC) 20 MG capsule Take 1 capsule (20 mg total) by mouth daily. 11/17/14  Yes Marin Olp, MD  gabapentin (NEURONTIN) 400 MG capsule Take 2 capsules (800 mg total) by mouth 4 (four) times daily. 01/01/15  Yes Dennie Bible, NP  levothyroxine (SYNTHROID, LEVOTHROID) 175 MCG tablet Take 1 tablet by mouth  daily before breakfast 08/25/14  Yes Marin Olp, MD  lipase/protease/amylase (CREON) 12000 UNITS CPEP capsule Take 2 capsules (24,000 Units total) by mouth 3 (three) times daily before meals. 05/25/14  Yes Michael Boston, MD  loratadine  (CLARITIN) 10 MG tablet Take 10 mg by mouth daily.   Yes Historical Provider, MD  lovastatin (MEVACOR) 40 MG tablet Take 1 tablet (40 mg total) by mouth at bedtime. 08/25/14  Yes Marin Olp, MD  meclizine (ANTIVERT) 25 MG tablet Take 25 mg by mouth 3 (three) times daily as needed for dizziness.   Yes Historical Provider, MD  Multiple Vitamin (MULTIVITAMIN WITH MINERALS) TABS tablet Take 1 tablet by mouth daily.   Yes Historical Provider, MD  omeprazole (PRILOSEC OTC) 20 MG tablet Take 20 mg by mouth 2 (  two) times daily.    Yes Historical Provider, MD  OXcarbazepine (TRILEPTAL) 150 MG tablet Take 150 mg by mouth 2 (two) times daily.   Yes Historical Provider, MD  polycarbophil (FIBERCON) 625 MG tablet Take 625 mg by mouth daily.    Yes Historical Provider, MD  promethazine (PHENERGAN) 25 MG tablet Take 25 mg by mouth every 6 (six) hours as needed for nausea or vomiting.   Yes Historical Provider, MD  rOPINIRole (REQUIP) 0.5 MG tablet One po qhs xone week, then 2 tabs po qhs Patient taking differently: Take 0.5 mg by mouth at bedtime.  04/29/14  Yes Marcial Pacas, MD  Simethicone 180 MG CAPS Take 180 mg by mouth 2 (two) times daily.    Yes Historical Provider, MD  topiramate (TOPAMAX) 200 MG tablet TAKE 1 TABLET BY MOUTH TWICE A DAY 09/02/14  Yes Marin Olp, MD  vitamin E 400 UNIT capsule Take 800 Units by mouth 2 (two) times daily. For tartive dyskinesia   Yes Historical Provider, MD  HYDROcodone-acetaminophen (NORCO/VICODIN) 5-325 MG per tablet Take 1-2 tablets by mouth every 4 (four) hours as needed for severe pain. 01/02/15   Sharlett Iles, MD  NONFORMULARY OR COMPOUNDED ITEM Doxep 3% Amanta 3% DXM 2% Lidocaine 2% Clonodine 0.2% and Tramadol 2%    Historical Provider, MD  UNABLE TO FIND Apply 1 application topically 4 (four) times daily. Doxep/Amanta/DXM/Lidocaine 25 mg for her feet    Historical Provider, MD   BP 165/81 mmHg  Pulse 87  Temp(Src) 98.2 F (36.8 C) (Oral)  Resp 16  Ht  5\' 1"  (1.549 m)  Wt 144 lb (65.318 kg)  BMI 27.22 kg/m2  SpO2 93% Physical Exam  Constitutional: She is oriented to person, place, and time. She appears well-developed and well-nourished.  Uncomfortable but in no acute distress  HENT:  Head: Normocephalic and atraumatic.  Moist mucous membranes  Eyes: Conjunctivae and EOM are normal. Pupils are equal, round, and reactive to light.  Neck:  In c-collar  Cardiovascular: Normal rate, regular rhythm and normal heart sounds.   No murmur heard. Pulmonary/Chest: Effort normal and breath sounds normal. She exhibits no tenderness.  Abdominal: Soft. Bowel sounds are normal. She exhibits no distension. There is no tenderness.  Musculoskeletal:  Pelvis stable; swelling and closed deformity to the radial side of left wrist with surrounding edema; 2+ radial pulses and normal cap refill; normal sensation L hand; no L elbow or shoulder pain; soft L forearm compartments; no tenderness b/l knees  Neurological: She is alert and oriented to person, place, and time. She exhibits normal muscle tone.  Fluent speech; normal strength and sensation throughout  Skin:  Developing ecchymosis L wrist  Psychiatric: She has a normal mood and affect. Judgment normal.  Nursing note and vitals reviewed.   ED Course  Procedures (including critical care time) Labs Review Labs Reviewed - No data to display  Imaging Review Dg Forearm Left  01/01/2015   CLINICAL DATA:  74 year old female with history of trauma from a fall landing onto the left arm complaining of left forearm pain.  EXAM: LEFT FOREARM - 2 VIEW  COMPARISON:  No priors.  FINDINGS: Two views of the left forearm demonstrate an irregular lucency through the dorsal aspect of the distal radius, which appears to potentially extend to the joint space, concerning for a subtle nondisplaced fracture. This is best appreciated on the lateral projection (which is suboptimal). There appears to be some adjacent soft tissue  swelling. Ulna is  intact.  IMPRESSION: 1. Findings concerning for probable nondisplaced intra-articular fracture of the distal radius. Dedicated wrist radiographs are recommended.   Electronically Signed   By: Vinnie Langton M.D.   On: 01/01/2015 20:06   Dg Wrist Complete Left  01/02/2015   CLINICAL DATA:  Tripped over her shoe in the kitchen today.  EXAM: LEFT WRIST - COMPLETE 3+ VIEW  COMPARISON:  None.  FINDINGS: There is a nondisplaced intra-articular fracture of the distal radius, primarily extending across the radial styloid. No significant step-off in the articular surface. No displacement.  There also is widening of the scapholunate interval which is consistent with scapholunate dissociation of indeterminate chronicity.  IMPRESSION: Acute nondisplaced intra-articular fracture of the distal radius. Scapholunate dissociation of indeterminate age.   Electronically Signed   By: Andreas Newport M.D.   On: 01/02/2015 00:14   Ct Head Wo Contrast  01/02/2015   CLINICAL DATA:  Tripped bringing groceries into the house and struck her forehead.  EXAM: CT HEAD WITHOUT CONTRAST  CT CERVICAL SPINE WITHOUT CONTRAST  TECHNIQUE: Multidetector CT imaging of the head and cervical spine was performed following the standard protocol without intravenous contrast. Multiplanar CT image reconstructions of the cervical spine were also generated.  COMPARISON:  04/08/2012  FINDINGS: CT HEAD FINDINGS  There is no intracranial hemorrhage or extra-axial fluid collection. There is moderate generalized cerebral atrophy and more severe symmetric cerebellar atrophy. Extensive white matter hypodensity is present, likely chronic small vessel ischemic disease. No acute intracranial findings are evident. There is no bony abnormality. Calvarium and skullbase are intact.  CT CERVICAL SPINE FINDINGS  The vertebral column, pedicles and facet articulations are intact. There is no evidence of acute fracture. No acute soft tissue abnormalities  are evident.  Moderate degenerative disc changes are present at C5-6.  IMPRESSION: 1. Negative for acute intracranial traumatic injury. There is generalized cerebral atrophy and more severe cerebellar atrophy. Chronic small vessel disease is present. 2. Negative for acute cervical spine fracture   Electronically Signed   By: Andreas Newport M.D.   On: 01/02/2015 00:39   Ct Cervical Spine Wo Contrast  01/02/2015   CLINICAL DATA:  Tripped bringing groceries into the house and struck her forehead.  EXAM: CT HEAD WITHOUT CONTRAST  CT CERVICAL SPINE WITHOUT CONTRAST  TECHNIQUE: Multidetector CT imaging of the head and cervical spine was performed following the standard protocol without intravenous contrast. Multiplanar CT image reconstructions of the cervical spine were also generated.  COMPARISON:  04/08/2012  FINDINGS: CT HEAD FINDINGS  There is no intracranial hemorrhage or extra-axial fluid collection. There is moderate generalized cerebral atrophy and more severe symmetric cerebellar atrophy. Extensive white matter hypodensity is present, likely chronic small vessel ischemic disease. No acute intracranial findings are evident. There is no bony abnormality. Calvarium and skullbase are intact.  CT CERVICAL SPINE FINDINGS  The vertebral column, pedicles and facet articulations are intact. There is no evidence of acute fracture. No acute soft tissue abnormalities are evident.  Moderate degenerative disc changes are present at C5-6.  IMPRESSION: 1. Negative for acute intracranial traumatic injury. There is generalized cerebral atrophy and more severe cerebellar atrophy. Chronic small vessel disease is present. 2. Negative for acute cervical spine fracture   Electronically Signed   By: Andreas Newport M.D.   On: 01/02/2015 00:39     EKG Interpretation None      MDM   Final diagnoses:  Radius fracture, left, closed, initial encounter   74 year old female who presents with  fall from standing that occurred  at home. No syncope. Patient ambulatory but complaining of neck pain, so she was placed in a c-collar. Obtained above imaging including CT of head and C-spine as well as plain films of left arm. Gave the patient Lortab for pain.  CTs were negative for acute injury. Plain film showed an acute nondisplaced intra-articular fracture of the distal left radius as well as an age indeterminate scapholunate dissociation. Patient placed in sugar tong splint and sling and given an instructions to follow-up with orthopedics within 1 week. Return precautions including signs of neurovascular compromise reviewed. Instructed on supportive care. Patient discharged in satisfactory condition.     Sharlett Iles, MD 01/02/15 [id number]

## 2015-01-02 NOTE — ED Notes (Signed)
Pt stable, ambulatory, states understanding of discharge instructions 

## 2015-01-02 NOTE — ED Notes (Signed)
Ortho at bedside.

## 2015-01-02 NOTE — Progress Notes (Signed)
Orthopedic Tech Progress Note Patient Details:  [name] 1941/04/01 [id number]  Ortho Devices Type of Ortho Device: Ace wrap, Sugartong splint, Arm sling Ortho Device/Splint Location: LUE Ortho Device/Splint Interventions: Application   Asia R Thompson 01/02/2015, 1:28 AM

## 2015-01-02 NOTE — ED Notes (Signed)
Spoke with Ortho tech will be down shortly to place splint

## 2015-01-03 ENCOUNTER — Emergency Department (HOSPITAL_BASED_OUTPATIENT_CLINIC_OR_DEPARTMENT_OTHER)
Admission: EM | Admit: 2015-01-03 | Discharge: 2015-01-03 | Disposition: A | Discharge status: Home / Self Care | Payer: Medicare Other | Attending: Emergency Medicine | Admitting: Emergency Medicine

## 2015-01-03 ENCOUNTER — Encounter (HOSPITAL_BASED_OUTPATIENT_CLINIC_OR_DEPARTMENT_OTHER): Payer: Self-pay | Admitting: Emergency Medicine

## 2015-01-03 DIAGNOSIS — I10 Essential (primary) hypertension: Secondary | ICD-10-CM | POA: Insufficient documentation

## 2015-01-03 DIAGNOSIS — M1990 Unspecified osteoarthritis, unspecified site: Secondary | ICD-10-CM | POA: Diagnosis not present

## 2015-01-03 DIAGNOSIS — Z8709 Personal history of other diseases of the respiratory system: Secondary | ICD-10-CM | POA: Insufficient documentation

## 2015-01-03 DIAGNOSIS — R55 Syncope and collapse: Secondary | ICD-10-CM | POA: Diagnosis not present

## 2015-01-03 DIAGNOSIS — M25532 Pain in left wrist: Secondary | ICD-10-CM | POA: Insufficient documentation

## 2015-01-03 DIAGNOSIS — E039 Hypothyroidism, unspecified: Secondary | ICD-10-CM | POA: Insufficient documentation

## 2015-01-03 DIAGNOSIS — Z8669 Personal history of other diseases of the nervous system and sense organs: Secondary | ICD-10-CM | POA: Diagnosis not present

## 2015-01-03 DIAGNOSIS — K219 Gastro-esophageal reflux disease without esophagitis: Secondary | ICD-10-CM | POA: Diagnosis not present

## 2015-01-03 DIAGNOSIS — M7989 Other specified soft tissue disorders: Secondary | ICD-10-CM | POA: Insufficient documentation

## 2015-01-03 DIAGNOSIS — F329 Major depressive disorder, single episode, unspecified: Secondary | ICD-10-CM | POA: Insufficient documentation

## 2015-01-03 DIAGNOSIS — Z8673 Personal history of transient ischemic attack (TIA), and cerebral infarction without residual deficits: Secondary | ICD-10-CM | POA: Diagnosis not present

## 2015-01-03 DIAGNOSIS — Z8719 Personal history of other diseases of the digestive system: Secondary | ICD-10-CM | POA: Diagnosis not present

## 2015-01-03 DIAGNOSIS — Z8507 Personal history of malignant neoplasm of pancreas: Secondary | ICD-10-CM | POA: Insufficient documentation

## 2015-01-03 DIAGNOSIS — R011 Cardiac murmur, unspecified: Secondary | ICD-10-CM | POA: Insufficient documentation

## 2015-01-03 DIAGNOSIS — Z4789 Encounter for other orthopedic aftercare: Secondary | ICD-10-CM | POA: Diagnosis not present

## 2015-01-03 DIAGNOSIS — Z79899 Other long term (current) drug therapy: Secondary | ICD-10-CM | POA: Insufficient documentation

## 2015-01-03 DIAGNOSIS — Z7982 Long term (current) use of aspirin: Secondary | ICD-10-CM | POA: Diagnosis not present

## 2015-01-03 MED ORDER — HYDROCODONE-ACETAMINOPHEN 5-325 MG PO TABS: 1.0000 | ORAL_TABLET | ORAL | Status: DC | PRN

## 2015-01-03 NOTE — ED Provider Notes (Signed)
CSN: [id number]   Arrival date & time 01/03/15 1518  History  This chart was scribed for  Tanna Furry, MD by Altamease Oiler, ED Scribe. This patient was seen in room MH07/MH07 and the patient's care was started at 3:38 PM.  Chief Complaint  Patient presents with  . Arm Pain  . Cast Problem    HPI The history is provided by the patient. No language interpreter was used.   [name] is a 73 y.o. female who presents to the Emergency Department complaining of increased left hand swelling with onset 3 days ago after a trip and fall at home. She was carrying bags when she fell and went to her knees and the hands. She was seen at [facility] after the fall and told that she had a broken left wrist. Her wist was splinted at that time. Scheduled f/u on 01/06/15 with Dr. Lorin Mercy. Associated symptoms include 2 episodes of near syncope while having increased left wrist pain. Pt notes frequent syncopal episodes in the past.     Past Medical History  Diagnosis Date  . Osteoarthritis   . Hiatal hernia   . Chronic maxillary sinusitis     neti pot  . Depression     alone a lot  . Eustachian tube dysfunction   . Tardive dyskinesia     possibly reglan, vitamin E helps  . Mitral valve prolapse     antibiotics before dental procedures  . GERD (gastroesophageal reflux disease)   . Allergic rhinitis   . Glaucoma   . Hypothyroid   . Hypertension   . Heart murmur     hx. "MVP" -predental antibiotics  . Memory loss     short term memory loss  . Urgency of urination     some UTI in past  . Stroke     mini storkes left leg paraylsis. patient denies weakness 01/08/14.   . Cancer 07/10/13    Pancreatic cancer with MRI scan 06-19-13    Past Surgical History  Procedure Laterality Date  . Knee surgery Left     x 5, total knee Left knee  . 1 baker cyst removed    . Dilation and curettage of uterus      x3  . Lumbar spine surgery      x2  . Abdominal hysterectomy      including ovaries  . Lumbar spine  surgery      cyst  . Joint replacement      LTKA  . Blepharoplasty Bilateral     with cataract surgery  . Eye surgery Right     cataract  . Eus N/A 07/10/2013    Procedure: ESOPHAGEAL ENDOSCOPIC ULTRASOUND (EUS) RADIAL;  Surgeon: Arta Silence, MD;  Location: [facility];  Service: Endoscopy;  Laterality: N/A;  . Fine needle aspiration N/A 07/10/2013    Procedure: FINE NEEDLE ASPIRATION (FNA) LINEAR;  Surgeon: Arta Silence, MD;  Location: [facility];  Service: Endoscopy;  Laterality: N/A;  possible fna  . Back surgery      fusion  . Breast surgery      Biopsy left 2 times  . Colonoscopy w/ polypectomy      2004 last colonoscopy, no polyps  . Laparoscopy N/A 08/07/2013    Procedure: LAPAROSCOPY DIAGNOSTIC;  Surgeon: [name], MD;  Location: [facility];  Service: General;  Laterality: N/A;  . Whipple procedure N/A 08/07/2013    Procedure: WHIPPLE PROCEDURE;  Surgeon: [name], MD;  Location: [place];  Service:  General;  Laterality: N/A;  . Esophagogastroduodenoscopy N/A 09/11/2013    Procedure: ESOPHAGOGASTRODUODENOSCOPY (EGD);  Surgeon: Cleotis Nipper, MD;  Location: [facility] ENDOSCOPY;  Service: Endoscopy;  Laterality: N/A;  Moderate sedation okay if MAC not available    Family History  Problem Relation Age of Onset  . Cancer Mother     Breast Cancer with Metastatic disease  . Alzheimer's disease Father   . Cancer Maternal Uncle     prostate    History  Substance Use Topics  . Smoking status: Never Smoker   . Smokeless tobacco: Never Used  . Alcohol Use: No     Review of Systems  Constitutional: Negative for fever, chills, diaphoresis, appetite change and fatigue.  HENT: Negative for mouth sores, sore throat and trouble swallowing.   Eyes: Negative for visual disturbance.  Respiratory: Negative for cough, chest tightness, shortness of breath and wheezing.   Cardiovascular: Negative for chest pain.  Gastrointestinal: Negative for nausea, vomiting, abdominal pain, diarrhea  and abdominal distention.  Endocrine: Negative for polydipsia, polyphagia and polyuria.  Genitourinary: Negative for dysuria, frequency and hematuria.  Musculoskeletal: Negative for gait problem.       Left hand swelling Left wrist pain  Skin: Negative for color change, pallor and rash.  Neurological: Positive for syncope (near). Negative for dizziness, light-headedness and headaches.  Hematological: Does not bruise/bleed easily.  Psychiatric/Behavioral: Negative for behavioral problems and confusion.     Home Medications   Prior to Admission medications   Medication Sig Start Date End Date Taking? Authorizing Provider  acetaminophen (TYLENOL) 325 MG tablet Take 650 mg by mouth every 6 (six) hours as needed for mild pain, moderate pain or fever.    Historical Provider, MD  aluminum & magnesium hydroxide-simethicone (MYLANTA) 500-450-40 MG/5ML suspension Take 5 mLs by mouth every 6 (six) hours as needed for indigestion.    Historical Provider, MD  aspirin 81 MG tablet Take 81 mg by mouth at bedtime.     Historical Provider, MD  atenolol (TENORMIN) 25 MG tablet Take 1 tablet (25 mg total) by mouth every morning. 08/25/14   Marin Olp, MD  carboxymethylcellulose (REFRESH TEARS) 0.5 % SOLN Place 1 drop into both eyes 2 (two) times daily.     Historical Provider, MD  cholecalciferol (VITAMIN D) 1000 UNITS tablet Take 1,000 Units by mouth 2 (two) times daily.     Historical Provider, MD  cyanocobalamin 2000 MCG tablet Take 2,000 mcg by mouth daily at 12 noon.     Historical Provider, MD  docusate sodium (COLACE) 100 MG capsule Take 200 mg by mouth at bedtime.    Historical Provider, MD  DULoxetine (CYMBALTA) 30 MG capsule One po qam xone week, then 2 tabs po qam Patient taking differently: Take 60 mg by mouth at bedtime. One po qam xone week, then 2 tabs po qam 09/03/14   Marcial Pacas, MD  feeding supplement (BOOST HIGH PROTEIN) LIQD Take 1 Container by mouth daily.    Historical Provider, MD   FLUoxetine (PROZAC) 20 MG capsule Take 1 capsule (20 mg total) by mouth daily. 11/17/14   Marin Olp, MD  gabapentin (NEURONTIN) 400 MG capsule Take 2 capsules (800 mg total) by mouth 4 (four) times daily. 01/01/15   Dennie Bible, NP  levothyroxine (SYNTHROID, LEVOTHROID) 175 MCG tablet Take 1 tablet by mouth  daily before breakfast 08/25/14   Marin Olp, MD  lipase/protease/amylase (CREON) 12000 UNITS CPEP capsule Take 2 capsules (24,000 Units total) by mouth  3 (three) times daily before meals. 05/25/14   Michael Boston, MD  loratadine (CLARITIN) 10 MG tablet Take 10 mg by mouth daily.    Historical Provider, MD  lovastatin (MEVACOR) 40 MG tablet Take 1 tablet (40 mg total) by mouth at bedtime. 08/25/14   Marin Olp, MD  meclizine (ANTIVERT) 25 MG tablet Take 25 mg by mouth 3 (three) times daily as needed for dizziness.    Historical Provider, MD  Multiple Vitamin (MULTIVITAMIN WITH MINERALS) TABS tablet Take 1 tablet by mouth daily.    Historical Provider, MD  NONFORMULARY OR COMPOUNDED ITEM Doxep 3% Amanta 3% DXM 2% Lidocaine 2% Clonodine 0.2% and Tramadol 2%    Historical Provider, MD  omeprazole (PRILOSEC OTC) 20 MG tablet Take 20 mg by mouth 2 (two) times daily.     Historical Provider, MD  OXcarbazepine (TRILEPTAL) 150 MG tablet Take 150 mg by mouth 2 (two) times daily.    Historical Provider, MD  polycarbophil (FIBERCON) 625 MG tablet Take 625 mg by mouth daily.     Historical Provider, MD  promethazine (PHENERGAN) 25 MG tablet Take 25 mg by mouth every 6 (six) hours as needed for nausea or vomiting.    Historical Provider, MD  rOPINIRole (REQUIP) 0.5 MG tablet One po qhs xone week, then 2 tabs po qhs Patient taking differently: Take 0.5 mg by mouth at bedtime.  04/29/14   Marcial Pacas, MD  Simethicone 180 MG CAPS Take 180 mg by mouth 2 (two) times daily.     Historical Provider, MD  topiramate (TOPAMAX) 200 MG tablet TAKE 1 TABLET BY MOUTH TWICE A DAY 09/02/14   Marin Olp, MD  UNABLE TO FIND Apply 1 application topically 4 (four) times daily. Doxep/Amanta/DXM/Lidocaine 25 mg for her feet    Historical Provider, MD  vitamin E 400 UNIT capsule Take 800 Units by mouth 2 (two) times daily. For tartive dyskinesia    Historical Provider, MD    Allergies  Other; Metoclopramide hcl; Niacin; Trovan; Nortriptyline; Benzocaine-resorcinol; Celecoxib; Glucosamine; Phenazopyridine hcl; Sulfa antibiotics; and Sulfonamide derivatives  Triage Vitals: BP 176/73 mmHg  Pulse 81  Temp(Src) 98 F (36.7 C) (Oral)  Resp 18  Ht 5\' 1"  (1.549 m)  Wt 144 lb (65.318 kg)  BMI 27.22 kg/m2  SpO2 96%  Physical Exam  Constitutional: She is oriented to person, place, and time. She appears well-developed and well-nourished. No distress.  HENT:  Head: Normocephalic.  Eyes: Conjunctivae are normal. Pupils are equal, round, and reactive to light. No scleral icterus.  Neck: Normal range of motion. Neck supple. No thyromegaly present.  Cardiovascular: Normal rate and regular rhythm.  Exam reveals no gallop and no friction rub.   No murmur heard. Pulmonary/Chest: Effort normal and breath sounds normal. No respiratory distress. She has no wheezes. She has no rales.  Abdominal: Soft. Bowel sounds are normal. She exhibits no distension. There is no tenderness. There is no rebound.  Musculoskeletal: Normal range of motion.  Moderate soft tissue swelling at the mid metacarpals and distally Good capillary refill Good sensation  Neurological: She is alert and oriented to person, place, and time.  Skin: Skin is warm and dry. No rash noted.  Psychiatric: She has a normal mood and affect. Her behavior is normal.    ED Course  Procedures   DIAGNOSTIC STUDIES: Oxygen Saturation is 96% on RA, normal by my interpretation.    COORDINATION OF CARE: 3:44 PM Discussed treatment plan which includes wrist splinting and EKG  with pt at bedside and pt agreed to plan.  Labs Review- Labs Reviewed  - No data to display  Imaging Review No results found.  EKG Interpretation None     MDM   Final diagnoses:  Tight cast   I personally performed the services described in this documentation, which was scribed in my presence. The recorded information has been reviewed and is accurate.    Tanna Furry, MD 01/04/15 2138

## 2015-01-03 NOTE — ED Notes (Addendum)
This RN and EMT Iona Beard removed lt arm/wrist sugar tong cast. Swelling present to hand and fingers discolored with cap refill greater than 3 sec. Once cast and covering removed lt wrist deformity present. Cap refill less than 3 sec and 2+ radial pulse present. Patient reports falling again yesterday. Patient lives by self. EDP at bedside. Ice applied to lt arm elevated on pillows.

## 2015-01-03 NOTE — ED Notes (Signed)
Pt in c/o increased pain and significant swelling to L hand that is distal to a splint that was placed at [employer] x 2 days ago. Finger distal to splint are warm but darker in color and significantly swollen compared to R hand.

## 2015-01-03 NOTE — Discharge Instructions (Signed)
You may remove the Ace wrap at any time from your splint if your fingers are more swollen, numb, or change in color.   Recheck here if not improving after removing Ace wrap.

## 2015-01-04 ENCOUNTER — Encounter (HOSPITAL_BASED_OUTPATIENT_CLINIC_OR_DEPARTMENT_OTHER): Payer: Self-pay | Admitting: Emergency Medicine

## 2015-01-04 ENCOUNTER — Emergency Department (HOSPITAL_BASED_OUTPATIENT_CLINIC_OR_DEPARTMENT_OTHER)
Admission: EM | Admit: 2015-01-04 | Discharge: 2015-01-04 | Disposition: A | Discharge status: Home / Self Care | Payer: Medicare Other | Attending: Emergency Medicine | Admitting: Emergency Medicine

## 2015-01-04 DIAGNOSIS — Z79899 Other long term (current) drug therapy: Secondary | ICD-10-CM | POA: Diagnosis not present

## 2015-01-04 DIAGNOSIS — I10 Essential (primary) hypertension: Secondary | ICD-10-CM | POA: Diagnosis not present

## 2015-01-04 DIAGNOSIS — E039 Hypothyroidism, unspecified: Secondary | ICD-10-CM | POA: Insufficient documentation

## 2015-01-04 DIAGNOSIS — H409 Unspecified glaucoma: Secondary | ICD-10-CM | POA: Insufficient documentation

## 2015-01-04 DIAGNOSIS — S5292XD Unspecified fracture of left forearm, subsequent encounter for closed fracture with routine healing: Secondary | ICD-10-CM | POA: Diagnosis not present

## 2015-01-04 DIAGNOSIS — Z8709 Personal history of other diseases of the respiratory system: Secondary | ICD-10-CM | POA: Insufficient documentation

## 2015-01-04 DIAGNOSIS — W1839XD Other fall on same level, subsequent encounter: Secondary | ICD-10-CM | POA: Insufficient documentation

## 2015-01-04 DIAGNOSIS — Z8507 Personal history of malignant neoplasm of pancreas: Secondary | ICD-10-CM | POA: Insufficient documentation

## 2015-01-04 DIAGNOSIS — M1990 Unspecified osteoarthritis, unspecified site: Secondary | ICD-10-CM | POA: Diagnosis not present

## 2015-01-04 DIAGNOSIS — K219 Gastro-esophageal reflux disease without esophagitis: Secondary | ICD-10-CM | POA: Insufficient documentation

## 2015-01-04 DIAGNOSIS — S5292XA Unspecified fracture of left forearm, initial encounter for closed fracture: Secondary | ICD-10-CM

## 2015-01-04 DIAGNOSIS — Z7982 Long term (current) use of aspirin: Secondary | ICD-10-CM | POA: Insufficient documentation

## 2015-01-04 DIAGNOSIS — R011 Cardiac murmur, unspecified: Secondary | ICD-10-CM | POA: Diagnosis not present

## 2015-01-04 DIAGNOSIS — Z8673 Personal history of transient ischemic attack (TIA), and cerebral infarction without residual deficits: Secondary | ICD-10-CM | POA: Diagnosis not present

## 2015-01-04 DIAGNOSIS — F329 Major depressive disorder, single episode, unspecified: Secondary | ICD-10-CM | POA: Insufficient documentation

## 2015-01-04 DIAGNOSIS — S5290XA Unspecified fracture of unspecified forearm, initial encounter for closed fracture: Secondary | ICD-10-CM | POA: Diagnosis not present

## 2015-01-04 DIAGNOSIS — M79602 Pain in left arm: Secondary | ICD-10-CM | POA: Diagnosis present

## 2015-01-04 NOTE — ED Notes (Signed)
Patient reports increased arm pain and swelling to left arm after being seen in ER last night.  Reports itching and soreness to left arm.  Reports she loosened splint twice today without relief.

## 2015-01-04 NOTE — Discharge Instructions (Signed)
Radial Fracture You have a broken bone (fracture) of the forearm. This is the part of your arm between the elbow and your wrist. Your forearm is made up of two bones. These are the radius and ulna. Your fracture is in the radial shaft. This is the bone in your forearm located on the thumb side. A cast or splint is used to protect and keep your injured bone from moving. The cast or splint will be on generally for about 5 to 6 weeks, with individual variations. HOME CARE INSTRUCTIONS   Keep the injured part elevated while sitting or lying down. Keep the injury above the level of your heart (the center of the chest). This will decrease swelling and pain.  Apply ice to the injury for 15-20 minutes, 03-04 times per day while awake, for 2 days. Put the ice in a plastic bag and place a towel between the bag of ice and your cast or splint.  Move your fingers to avoid stiffness and minimize swelling.  If you have a plaster or fiberglass cast:  Do not try to scratch the skin under the cast using sharp or pointed objects.  Check the skin around the cast every day. You may put lotion on any red or sore areas.  Keep your cast dry and clean.  If you have a plaster splint:  Wear the splint as directed.  You may loosen the elastic around the splint if your fingers become numb, tingle, or turn cold or blue.  Do not put pressure on any part of your cast or splint. It may break. Rest your cast only on a pillow for the first 24 hours until it is fully hardened.  Your cast or splint can be protected during bathing with a plastic bag. Do not lower the cast or splint into water.  Only take over-the-counter or prescription medicines for pain, discomfort, or fever as directed by your caregiver. SEEK IMMEDIATE MEDICAL CARE IF:   Your cast gets damaged or breaks.  You have more severe pain or swelling than you did before getting the cast.  You have severe pain when stretching your fingers.  There is a bad  smell, new stains and/or pus-like (purulent) drainage coming from under the cast.  Your fingers or hand turn pale or blue and become cold or your loose feeling. Document Released: 10/27/2005 Document Revised: 08/08/2011 Document Reviewed: 01/23/2006 [facility] Patient Information 2015 [place], [place]. This information is not intended to replace advice given to you by your health care provider. Make sure you discuss any questions you have with your health care provider.

## 2015-01-04 NOTE — ED Provider Notes (Signed)
CSN: [id number]     Arrival date & time 01/04/15  1738 History  This chart was scribed for [name] Belling, MD by Helane Gunther, ED Scribe. This patient was seen in room MHFT1/MHFT1 and the patient's care was started at 5:59 PM.    Chief Complaint  Patient presents with  . Arm Injury   The history is provided by the patient. No language interpreter was used.   HPI Comments: [name] is a 74 y.o. female who presents to the Emergency Department complaining of an injury to the left arm after a fall 3 days ago. She states she fell landing on her left side and arm, as well as her nose. She went to the ED that day (01/01/15). She states that she was diagnosed with a Fx of the left wrist and is concerned as the swelling and pain have not diminished since then. She is not on blood thinners, but takes baby aspirin.   Past Medical History  Diagnosis Date  . Osteoarthritis   . Hiatal hernia   . Chronic maxillary sinusitis     neti pot  . Depression     alone a lot  . Eustachian tube dysfunction   . Tardive dyskinesia     possibly reglan, vitamin E helps  . Mitral valve prolapse     antibiotics before dental procedures  . GERD (gastroesophageal reflux disease)   . Allergic rhinitis   . Glaucoma   . Hypothyroid   . Hypertension   . Heart murmur     hx. "MVP" -predental antibiotics  . Memory loss     short term memory loss  . Urgency of urination     some UTI in past  . Stroke     mini storkes left leg paraylsis. patient denies weakness 01/08/14.   . Cancer 07/10/13    Pancreatic cancer with MRI scan 06-19-13   Past Surgical History  Procedure Laterality Date  . Knee surgery Left     x 5, total knee Left knee  . 1 baker cyst removed    . Dilation and curettage of uterus      x3  . Lumbar spine surgery      x2  . Abdominal hysterectomy      including ovaries  . Lumbar spine surgery      cyst  . Joint replacement      LTKA  . Blepharoplasty Bilateral     with cataract  surgery  . Eye surgery Right     cataract  . Eus N/A 07/10/2013    Procedure: ESOPHAGEAL ENDOSCOPIC ULTRASOUND (EUS) RADIAL;  Surgeon: Arta Silence, MD;  Location: [facility];  Service: Endoscopy;  Laterality: N/A;  . Fine needle aspiration N/A 07/10/2013    Procedure: FINE NEEDLE ASPIRATION (FNA) LINEAR;  Surgeon: Arta Silence, MD;  Location: [facility];  Service: Endoscopy;  Laterality: N/A;  possible fna  . Back surgery      fusion  . Breast surgery      Biopsy left 2 times  . Colonoscopy w/ polypectomy      2004 last colonoscopy, no polyps  . Laparoscopy N/A 08/07/2013    Procedure: LAPAROSCOPY DIAGNOSTIC;  Surgeon: [name], MD;  Location: [facility];  Service: General;  Laterality: N/A;  . Whipple procedure N/A 08/07/2013    Procedure: WHIPPLE PROCEDURE;  Surgeon: [name], MD;  Location: [name];  Service: General;  Laterality: N/A;  . Esophagogastroduodenoscopy N/A 09/11/2013    Procedure: ESOPHAGOGASTRODUODENOSCOPY (EGD);  Surgeon: Cleotis Nipper, MD;  Location: [facility] ENDOSCOPY;  Service: Endoscopy;  Laterality: N/A;  Moderate sedation okay if MAC not available   Family History  Problem Relation Age of Onset  . Cancer Mother     Breast Cancer with Metastatic disease  . Alzheimer's disease Father   . Cancer Maternal Uncle     prostate   Social History  Substance Use Topics  . Smoking status: Never Smoker   . Smokeless tobacco: Never Used  . Alcohol Use: No   OB History    No data available     Review of Systems  Musculoskeletal: Positive for myalgias, joint swelling and arthralgias.  All other systems reviewed and are negative.     Allergies  Other; Metoclopramide hcl; Niacin; Trovan; Nortriptyline; Benzocaine-resorcinol; Celecoxib; Glucosamine; Phenazopyridine hcl; Sulfa antibiotics; and Sulfonamide derivatives  Home Medications   Prior to Admission medications   Medication Sig Start Date End Date Taking? Authorizing Provider  acetaminophen (TYLENOL)  325 MG tablet Take 650 mg by mouth every 6 (six) hours as needed for mild pain, moderate pain or fever.    Historical Provider, MD  aluminum & magnesium hydroxide-simethicone (MYLANTA) 500-450-40 MG/5ML suspension Take 5 mLs by mouth every 6 (six) hours as needed for indigestion.    Historical Provider, MD  aspirin 81 MG tablet Take 81 mg by mouth at bedtime.     Historical Provider, MD  atenolol (TENORMIN) 25 MG tablet Take 1 tablet (25 mg total) by mouth every morning. 08/25/14   Marin Olp, MD  carboxymethylcellulose (REFRESH TEARS) 0.5 % SOLN Place 1 drop into both eyes 2 (two) times daily.     Historical Provider, MD  cholecalciferol (VITAMIN D) 1000 UNITS tablet Take 1,000 Units by mouth 2 (two) times daily.     Historical Provider, MD  cyanocobalamin 2000 MCG tablet Take 2,000 mcg by mouth daily at 12 noon.     Historical Provider, MD  docusate sodium (COLACE) 100 MG capsule Take 200 mg by mouth at bedtime.    Historical Provider, MD  DULoxetine (CYMBALTA) 30 MG capsule One po qam xone week, then 2 tabs po qam Patient taking differently: Take 60 mg by mouth at bedtime. One po qam xone week, then 2 tabs po qam 09/03/14   Marcial Pacas, MD  feeding supplement (BOOST HIGH PROTEIN) LIQD Take 1 Container by mouth daily.    Historical Provider, MD  FLUoxetine (PROZAC) 20 MG capsule Take 1 capsule (20 mg total) by mouth daily. 11/17/14   Marin Olp, MD  gabapentin (NEURONTIN) 400 MG capsule Take 2 capsules (800 mg total) by mouth 4 (four) times daily. 01/01/15   Dennie Bible, NP  levothyroxine (SYNTHROID, LEVOTHROID) 175 MCG tablet Take 1 tablet by mouth  daily before breakfast 08/25/14   Marin Olp, MD  lipase/protease/amylase (CREON) 12000 UNITS CPEP capsule Take 2 capsules (24,000 Units total) by mouth 3 (three) times daily before meals. 05/25/14   Michael Boston, MD  loratadine (CLARITIN) 10 MG tablet Take 10 mg by mouth daily.    Historical Provider, MD  lovastatin (MEVACOR) 40 MG  tablet Take 1 tablet (40 mg total) by mouth at bedtime. 08/25/14   Marin Olp, MD  meclizine (ANTIVERT) 25 MG tablet Take 25 mg by mouth 3 (three) times daily as needed for dizziness.    Historical Provider, MD  Multiple Vitamin (MULTIVITAMIN WITH MINERALS) TABS tablet Take 1 tablet by mouth daily.    Historical Provider, MD  NONFORMULARY  OR COMPOUNDED ITEM Doxep 3% Amanta 3% DXM 2% Lidocaine 2% Clonodine 0.2% and Tramadol 2%    Historical Provider, MD  omeprazole (PRILOSEC OTC) 20 MG tablet Take 20 mg by mouth 2 (two) times daily.     Historical Provider, MD  OXcarbazepine (TRILEPTAL) 150 MG tablet Take 150 mg by mouth 2 (two) times daily.    Historical Provider, MD  polycarbophil (FIBERCON) 625 MG tablet Take 625 mg by mouth daily.     Historical Provider, MD  promethazine (PHENERGAN) 25 MG tablet Take 25 mg by mouth every 6 (six) hours as needed for nausea or vomiting.    Historical Provider, MD  rOPINIRole (REQUIP) 0.5 MG tablet One po qhs xone week, then 2 tabs po qhs Patient taking differently: Take 0.5 mg by mouth at bedtime.  04/29/14   Marcial Pacas, MD  Simethicone 180 MG CAPS Take 180 mg by mouth 2 (two) times daily.     Historical Provider, MD  topiramate (TOPAMAX) 200 MG tablet TAKE 1 TABLET BY MOUTH TWICE A DAY 09/02/14   Marin Olp, MD  UNABLE TO FIND Apply 1 application topically 4 (four) times daily. Doxep/Amanta/DXM/Lidocaine 25 mg for her feet    Historical Provider, MD  vitamin E 400 UNIT capsule Take 800 Units by mouth 2 (two) times daily. For tartive dyskinesia    Historical Provider, MD   BP 173/87 mmHg  Pulse 85  Temp(Src) 98.5 F (36.9 C) (Oral)  Resp 16  Ht 5\' 1"  (1.549 m)  Wt 144 lb (65.318 kg)  BMI 27.22 kg/m2  SpO2 98% Physical Exam  Constitutional: She is oriented to person, place, and time. She appears well-developed and well-nourished. No distress.  HENT:  Head: Normocephalic and atraumatic.  Mouth/Throat: Oropharynx is clear and moist.  Eyes:  Conjunctivae and EOM are normal. Pupils are equal, round, and reactive to light.  Neck: Normal range of motion. Neck supple. No tracheal deviation present.  Cardiovascular: Normal rate.   Pulmonary/Chest: Breath sounds normal. No respiratory distress.  Abdominal: Soft.  Musculoskeletal: She exhibits tenderness.  No ttp on L elbow. TTP and some deformity on L radius, ecchymosis on L wrist. Ecchymosis and swelling to the dorsum of the L hand that progresses up to the fingers, with good sensation and cap refill over radial median with normal distribution. Strong radial pulses.  Neurological: She is alert and oriented to person, place, and time.  Skin: Skin is warm and dry.  Psychiatric: She has a normal mood and affect. Her behavior is normal.  Nursing note and vitals reviewed.   ED Course  Procedures  DIAGNOSTIC STUDIES: Oxygen Saturation is 98% on RA, normal by my interpretation.    COORDINATION OF CARE: 6:02 PM - Discussed plans to study previous XRs. Pt advised of plan for treatment and pt agrees.  Labs Review Labs Reviewed - No data to display  Imaging Review No results found.   EKG Interpretation None      MDM   Final diagnoses:  Radius fracture, left, closed, initial encounter    Patient with pain and swelling in her hand and wrist. Has known fracture. Reviewed x-ray with patient and this appears to be a normal post fracture swelling. Has follow-up in 2 days. Will discharge home  I personally performed the services described in this documentation, which was scribed in my presence. The recorded information has been reviewed and is accurate.    [name] Belling, MD 01/07/15 [phone]

## 2015-01-06 ENCOUNTER — Other Ambulatory Visit: Payer: Self-pay

## 2015-01-06 DIAGNOSIS — S52592A Other fractures of lower end of left radius, initial encounter for closed fracture: Secondary | ICD-10-CM | POA: Diagnosis not present

## 2015-01-06 DIAGNOSIS — Z8507 Personal history of malignant neoplasm of pancreas: Secondary | ICD-10-CM

## 2015-01-06 NOTE — Addendum Note (Signed)
Addended by: [name] on: 01/06/2015 02:06 PM   Modules accepted: Orders

## 2015-01-08 ENCOUNTER — Ambulatory Visit: Payer: Medicare Other | Admitting: Neurology

## 2015-01-13 ENCOUNTER — Other Ambulatory Visit: Payer: Self-pay

## 2015-01-13 DIAGNOSIS — Z8507 Personal history of malignant neoplasm of pancreas: Secondary | ICD-10-CM

## 2015-01-14 ENCOUNTER — Other Ambulatory Visit: Payer: Self-pay

## 2015-01-19 ENCOUNTER — Ambulatory Visit
Admission: RE | Admit: 2015-01-19 | Discharge: 2015-01-19 | Disposition: A | Discharge status: Home / Self Care | Payer: Medicare Other | Source: Ambulatory Visit | Attending: General Surgery | Admitting: General Surgery

## 2015-01-19 DIAGNOSIS — Z8507 Personal history of malignant neoplasm of pancreas: Secondary | ICD-10-CM | POA: Diagnosis not present

## 2015-01-19 DIAGNOSIS — R1905 Periumbilic swelling, mass or lump: Secondary | ICD-10-CM | POA: Diagnosis not present

## 2015-01-19 MED ORDER — IOPAMIDOL (ISOVUE-300) INJECTION 61%
100.0000 mL | Freq: Once | INTRAVENOUS | Status: AC | PRN
  Administered 2015-01-19: 100 mL via INTRAVENOUS

## 2015-01-19 NOTE — Progress Notes (Signed)
Quick Note:  Please let pt know that CT shows hernia at umbilical site. ______

## 2015-01-21 ENCOUNTER — Telehealth: Payer: Self-pay | Admitting: Nurse Practitioner

## 2015-01-21 MED ORDER — GABAPENTIN 100 MG PO CAPS: 200.0000 mg | ORAL_CAPSULE | Freq: Four times a day (QID) | ORAL | Status: DC

## 2015-01-21 NOTE — Telephone Encounter (Signed)
Last OV note says: Will increase gabapentin to 400 mg 2 tablets 4 times daily RX to patient I called back and spoke with the patient.  She still has the Rx that was provided at last OV.  She does not wish to get it refilled yet, as she has a Rx at home for 300mg  capsules.  She is requesting a Rx for 100mg  caps to take in addition to the 300mg  to total the new dose so she can use the medication she has on hand.  Requesting this Rx be sent to [facility].  Once she finishes this, she will take the new Rx written for 400mg  caps to the pharmacy.  She is aware she will need to ensure the correct dose is filled when she takes in the new Rx since there will be multiple doses of the same drug on file with them.  Rx has been sent, noted a new Rx will be filled once meds on hand are complete, hoping this will keep the pharmacy from having confusion while filling med.

## 2015-01-21 NOTE — Telephone Encounter (Signed)
Pt called and still has a rx filled from her old rx of gabapentin (NEURONTIN), she needs an additional 100mg  rx sent to [facility]. She states that the additional 100mg  will give her a total of 400mg  that she was instructed to take per Dr. Please call and advise [phone]

## 2015-01-22 ENCOUNTER — Other Ambulatory Visit: Payer: Medicare Other

## 2015-01-23 DIAGNOSIS — K432 Incisional hernia without obstruction or gangrene: Secondary | ICD-10-CM | POA: Diagnosis not present

## 2015-01-23 DIAGNOSIS — R55 Syncope and collapse: Secondary | ICD-10-CM | POA: Diagnosis not present

## 2015-01-23 DIAGNOSIS — C250 Malignant neoplasm of head of pancreas: Secondary | ICD-10-CM | POA: Diagnosis not present

## 2015-01-23 LAB — BASIC METABOLIC PANEL
BUN: 13 mg/dL (ref 4–21)
CREATININE: 0.7 mg/dL (ref 0.5–1.1)
Glucose: 107 mg/dL
Potassium: 4 mmol/L (ref 3.4–5.3)
Sodium: 144 mmol/L (ref 137–147)

## 2015-01-23 LAB — CBC AND DIFFERENTIAL
HCT: 39 % (ref 36–46)
HEMOGLOBIN: 13.2 g/dL (ref 12.0–16.0)
PLATELETS: 150 10*3/uL (ref 150–399)
WBC: 6.8 10*3/mL

## 2015-01-23 LAB — HEPATIC FUNCTION PANEL
ALT: 19 U/L (ref 7–35)
AST: 19 U/L (ref 13–35)
Alkaline Phosphatase: 108 U/L (ref 25–125)
Bilirubin, Total: 0.5 mg/dL

## 2015-02-03 ENCOUNTER — Other Ambulatory Visit (HOSPITAL_BASED_OUTPATIENT_CLINIC_OR_DEPARTMENT_OTHER): Payer: Medicare Other

## 2015-02-03 ENCOUNTER — Ambulatory Visit (HOSPITAL_BASED_OUTPATIENT_CLINIC_OR_DEPARTMENT_OTHER): Payer: Medicare Other | Admitting: Nurse Practitioner

## 2015-02-03 ENCOUNTER — Telehealth: Payer: Self-pay | Admitting: Oncology

## 2015-02-03 VITALS — BP 167/80 | HR 88 | Temp 98.4°F | Resp 18 | Ht 61.0 in | Wt 146.2 lb

## 2015-02-03 DIAGNOSIS — C250 Malignant neoplasm of head of pancreas: Secondary | ICD-10-CM

## 2015-02-03 DIAGNOSIS — Z8507 Personal history of malignant neoplasm of pancreas: Secondary | ICD-10-CM | POA: Diagnosis not present

## 2015-02-03 NOTE — Progress Notes (Signed)
  [facility] OFFICE PROGRESS NOTE   Diagnosis:  Pancreas cancer  INTERVAL HISTORY:   [name] returns as scheduled. She reports a good appetite. Her weight is stable. She denies abdominal pain. She reports she has an abdominal hernia and is considering hernia repair. Bowels moving regularly. No diarrhea. Over the past week she has noted intermittent hiccups. She had a recent fall fracturing the left wrist. She thinks her shoes caused her to trip. She reports falls in the past due to "blackout" episodes.  Objective:  Vital signs in last 24 hours:  Blood pressure 167/80, pulse 88, temperature 98.4 F (36.9 C), temperature source Oral, resp. rate 18, height 5\' 1"  (1.549 m), weight 146 lb 3.2 oz (66.316 kg), SpO2 96 %.    HEENT: No thrush or ulcers. Lymphatics: No palpable cervical, supra clavicular, axillary or inguinal lymph nodes. Resp: Lungs clear bilaterally. Cardio: Regular rate and rhythm. GI: Abdomen soft and nontender. No organomegaly. No mass. Vascular: No leg edema. Calves soft and nontender.    Lab Results:  Lab Results  Component Value Date   WBC 8.6 09/02/2014   HGB 14.3 09/02/2014   HCT 41.5 09/02/2014   MCV 96.1 09/02/2014   PLT 171.0 09/02/2014   NEUTROABS 5.7 02/15/2014    Imaging:  No results found.  Medications: I have reviewed the patient's current medications.  Assessment/Plan: 1. 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  CT abdomen/pelvis 01/19/2015 with no evidence of recurrent/metastatic disease.  2. Nausea following the Whipple procedure-improved 3. Mild thrombocytopenia on a CBC  07/01/2013  4. Abdominal pain. Mild, etiology unclear. She did not complain of abdominal pain 02/03/2015.   Disposition: [name] appears stable. She remains in clinical remission from pancreas cancer. We will follow-up on the CA-19-9 from today. She will return for a follow-up visit in CA-19-9 in 6 months. She will contact the office in the interim with any problems.  At today's visit she and her son report she has a history of "blackout" episodes. I strongly encouraged her to contact her PCP for evaluation.    Ned Card ANP/GNP-BC   02/03/2015  11:08 AM

## 2015-02-03 NOTE — Telephone Encounter (Signed)
Gave adn printed appt sched and avs for pt for March

## 2015-02-04 ENCOUNTER — Telehealth: Payer: Self-pay | Admitting: *Deleted

## 2015-02-04 ENCOUNTER — Encounter: Payer: Self-pay | Admitting: Family Medicine

## 2015-02-04 LAB — CANCER ANTIGEN 19-9: CA 19 9: 5.5 U/mL (ref <35.0)

## 2015-02-04 NOTE — Telephone Encounter (Signed)
Pt made aware of results and appreciated call. Also wanted to confirm her Buspar dosage: 15 mg, 1 tab in AM, 2 tab in PM. Wanted to know if Lattie Haw has spoken to cardiologist. Message routed to Ned Card NP

## 2015-02-04 NOTE — Telephone Encounter (Signed)
-----   Message from Ladell Pier, MD sent at 02/04/2015  9:03 AM EDT ----- Please call patient,ca19-9 is normal

## 2015-02-09 ENCOUNTER — Other Ambulatory Visit: Payer: Self-pay | Admitting: Family Medicine

## 2015-02-12 ENCOUNTER — Ambulatory Visit (INDEPENDENT_AMBULATORY_CARE_PROVIDER_SITE_OTHER): Payer: Medicare Other | Admitting: Family Medicine

## 2015-02-12 ENCOUNTER — Encounter: Payer: Self-pay | Admitting: Family Medicine

## 2015-02-12 VITALS — BP 140/84 | HR 90 | Temp 98.5°F | Wt 146.0 lb

## 2015-02-12 DIAGNOSIS — Z23 Encounter for immunization: Secondary | ICD-10-CM | POA: Diagnosis not present

## 2015-02-12 DIAGNOSIS — R51 Headache: Secondary | ICD-10-CM

## 2015-02-12 DIAGNOSIS — R519 Headache, unspecified: Secondary | ICD-10-CM

## 2015-02-12 NOTE — Patient Instructions (Addendum)
Received flu shot today.  Given new headache pattern over last 3 months- we opted to proceed with MRI of the brain. We will call you when this is scheduled. We will likely need another kidney function test before they can do it but we will wait until scheduled

## 2015-02-12 NOTE — Progress Notes (Signed)
[name], MD  Subjective:  [name] is a 74 y.o. year old very pleasant female patient who presents for/with See problem oriented charting ROS- Some blurry vision- uses magnifying glass. No issue with distant. Occasional floater. No weakness in arms or legs. No facial weakness or droop. No chest pain or shortness of breath  Past Medical History-  Patient Active Problem List   Diagnosis Date Noted  . Exocrine pancreatic insufficiency 12/30/2013    Priority: High  . Carcinoma of head of pancreas 07/01/2013    Priority: High  . MITRAL VALVE PROLAPSE 02/26/2007    Priority: High  . Fatty liver 09/09/2014    Priority: Medium  . Memory loss 01/08/2014    Priority: Medium  . Other abnormal glucose 11/29/2013    Priority: Medium  . Hereditary and idiopathic peripheral neuropathy 08/30/2012    Priority: Medium  . Paresthesia of foot 06/06/2012    Priority: Medium  . Syncope 09/26/2011    Priority: Medium  . HYPOTHYROIDISM 02/26/2007    Priority: Medium  . HYPERLIPIDEMIA 02/26/2007    Priority: Medium  . ANXIETY 02/26/2007    Priority: Medium  . DEPRESSION 02/26/2007    Priority: Medium  . HYPERTENSION 02/26/2007    Priority: Medium  . OSTEOPOROSIS 02/26/2007    Priority: Medium  . Abnormality of gait 03/27/2014    Priority: Low  . RHINOSINUSITIS, CHRONIC 02/03/2009    Priority: Low  . COLONIC POLYPS 06/05/2008    Priority: Low  . OSTEOARTHRITIS 06/04/2008    Priority: Low  . DIARRHEA, CHRONIC 04/22/2008    Priority: Low  . Low back pain 08/03/2007    Priority: Low  . Chronic maxillary sinusitis 04/20/2007    Priority: Low  . TARDIVE DYSKINESIA 02/26/2007    Priority: Low  . GLAUCOMA 02/26/2007    Priority: Low  . EUSTACHIAN TUBE DYSFUNCTION 02/26/2007    Priority: Low  . ALLERGIC RHINITIS 02/26/2007    Priority: Low  . GERD 02/26/2007    Priority: Low  . HIATAL HERNIA 02/26/2007    Priority: Low  . Paresthesia 07/07/2014  . Lumbago 07/07/2014     Medications- reviewed and updated Current Outpatient Prescriptions  Medication Sig Dispense Refill  . aspirin 81 MG tablet Take 81 mg by mouth at bedtime.     Marland Kitchen atenolol (TENORMIN) 25 MG tablet Take 1 tablet by mouth  every morning 90 tablet 3  . carboxymethylcellulose (REFRESH TEARS) 0.5 % SOLN Place 1 drop into both eyes 2 (two) times daily.     . cholecalciferol (VITAMIN D) 1000 UNITS tablet Take 1,000 Units by mouth 2 (two) times daily.     . cyanocobalamin 2000 MCG tablet Take 2,000 mcg by mouth daily at 12 noon.     . docusate sodium (COLACE) 100 MG capsule Take 200 mg by mouth at bedtime.    . DULoxetine (CYMBALTA) 30 MG capsule One po qam xone week, then 2 tabs po qam (Patient taking differently: Take 60 mg by mouth at bedtime. One po qam xone week, then 2 tabs po qam) 60 capsule 6  . feeding supplement (BOOST HIGH PROTEIN) LIQD Take 1 Container by mouth daily.    Marland Kitchen FLUoxetine (PROZAC) 20 MG capsule Take 1 capsule (20 mg total) by mouth daily. 90 capsule 2  . gabapentin (NEURONTIN) 400 MG capsule Take 2 capsules (800 mg total) by mouth 4 (four) times daily. 240 capsule 6  . levothyroxine (SYNTHROID, LEVOTHROID) 175 MCG tablet Take 1 tablet by mouth  daily before breakfast 90 tablet 2  . lipase/protease/amylase (CREON) 12000 UNITS CPEP capsule Take 2 capsules (24,000 Units total) by mouth 3 (three) times daily before meals. 270 capsule 5  . loratadine (CLARITIN) 10 MG tablet Take 10 mg by mouth daily.    Marland Kitchen lovastatin (MEVACOR) 40 MG tablet Take 1 tablet by mouth at  bedtime 90 tablet 2  . Multiple Vitamin (MULTIVITAMIN WITH MINERALS) TABS tablet Take 1 tablet by mouth daily.    . NONFORMULARY OR COMPOUNDED ITEM Doxep 3% Amanta 3% DXM 2% Lidocaine 2% Clonodine 0.2% and Tramadol 2%    . omeprazole (PRILOSEC OTC) 20 MG tablet Take 20 mg by mouth 2 (two) times daily.     . OXcarbazepine (TRILEPTAL) 150 MG tablet Take 150 mg by mouth 2 (two) times daily.    . polycarbophil (FIBERCON) 625  MG tablet Take 625 mg by mouth daily.     Marland Kitchen rOPINIRole (REQUIP) 0.5 MG tablet One po qhs xone week, then 2 tabs po qhs (Patient taking differently: Take 0.5 mg by mouth at bedtime. ) 60 tablet 11  . Simethicone 180 MG CAPS Take 180 mg by mouth 2 (two) times daily.     Marland Kitchen topiramate (TOPAMAX) 200 MG tablet TAKE 1 TABLET BY MOUTH TWICE A DAY 90 tablet 2  . UNABLE TO FIND Apply 1 application topically 4 (four) times daily. Doxep/Amanta/DXM/Lidocaine 25 mg for her feet    . vitamin E 400 UNIT capsule Take 800 Units by mouth 2 (two) times daily. For tartive dyskinesia    . acetaminophen (TYLENOL) 325 MG tablet Take 650 mg by mouth every 6 (six) hours as needed for mild pain, moderate pain or fever.    Marland Kitchen aluminum & magnesium hydroxide-simethicone (MYLANTA) 500-450-40 MG/5ML suspension Take 5 mLs by mouth every 6 (six) hours as needed for indigestion.    . meclizine (ANTIVERT) 25 MG tablet Take 25 mg by mouth 3 (three) times daily as needed for dizziness.    . promethazine (PHENERGAN) 25 MG tablet Take 25 mg by mouth every 6 (six) hours as needed for nausea or vomiting.     No current facility-administered medications for this visit.    Objective: BP 140/84 mmHg  Pulse 90  Temp(Src) 98.5 F (36.9 C)  Wt 146 lb (66.225 kg) Gen: NAD, resting comfortably CV: RRR no murmurs rubs or gallops Lungs: CTAB no crackles, wheeze, rhonchi Abdomen: soft/nontender/nondistended/normal bowel sounds. No rebound or guarding.  Ext: trace edema Skin: warm, dry Neuro: CN II-XII intact, sensation and reflexes normal throughout, 5/5 muscle strength in bilateral upper and lower extremities. Normal finger to nose. Normal rapid alternating movements. Normal romberg. No pronator drift.   Assessment/Plan:  Acute intractable headache, unspecified headache type - Plan: MR Brain W Wo Contrast S: Patient states since last visit. HA went away for a while. 3-4 days ago started back. Severe. L side of neck up into left side of  head. 5-6/10 level of pain. Says more severe than previous but pain rating is the same. Had a fall and had a radial fracture. Later had a Syncopal episode 2nd fall- did not go to the ER immediately. States had a Sweating spell after that. Apparently in one of her visits was referred to cardiology. With first fall, CT head 01/01/15 negative acute intracranial injury. Patient has had a short term HA about once a month for some time but usually relieved by tylenol. She has taken tylenol multiple times but not been able to break current  headache cycle.  A/P: New headache pattern in patient >50 with history pancreatic cancer. Will get MR head given recurrence over last 3 months. Has already had CT. Doubt malignancy or CVA related but this will certainly provide reassurance in prior cancer patient.   Return precautions advised.   Orders Placed This Encounter  Procedures  . MR Brain W Wo Contrast    146 LBS/NOT CLAUS/5'1"/NO BRAIN SURGERY/PT HAD EYE SURGERY/NO EAR OR HEART SURGERY/NO METAL EXPOSURE/NO IMPLANTS/PT HAS HTN/NOT DIABETIC/NO KIDNEY OR LIVER DISEASE/LABS TO BE DRAWN @ 315 SITE PT AWARE/NOT ALLERGIC TO CONTRAST/PT USES A CANE/INS/MEDICARE & AARP/RLC/PT W/EPIC ORDER    Standing Status: Future     Number of Occurrences:      Standing Expiration Date: 04/13/2016    Order Specific Question:  Reason for Exam (SYMPTOM  OR DIAGNOSIS REQUIRED)    Answer:  new headache pattern in patient >50 with history pancreatic cancer    Order Specific Question:  Preferred imaging location?    Answer:  [facility]    Order Specific Question:  Does the patient have a pacemaker or implanted devices?    Answer:  No    Order Specific Question:  What is the patient's sedation requirement?    Answer:  No Sedation  . Flu Vaccine QUAD 36+ mos IM

## 2015-02-13 DIAGNOSIS — S52592D Other fractures of lower end of left radius, subsequent encounter for closed fracture with routine healing: Secondary | ICD-10-CM | POA: Diagnosis not present

## 2015-02-17 ENCOUNTER — Ambulatory Visit: Payer: Medicare Other | Admitting: Family Medicine

## 2015-02-23 ENCOUNTER — Ambulatory Visit: Payer: Medicare Other | Admitting: Cardiovascular Disease

## 2015-02-26 ENCOUNTER — Ambulatory Visit: Payer: Medicare Other | Admitting: Family Medicine

## 2015-02-26 ENCOUNTER — Ambulatory Visit
Admission: RE | Admit: 2015-02-26 | Discharge: 2015-02-26 | Disposition: A | Discharge status: Home / Self Care | Payer: Medicare Other | Source: Ambulatory Visit | Attending: Family Medicine | Admitting: Family Medicine

## 2015-02-26 DIAGNOSIS — R51 Headache: Secondary | ICD-10-CM | POA: Diagnosis not present

## 2015-02-26 DIAGNOSIS — H9190 Unspecified hearing loss, unspecified ear: Secondary | ICD-10-CM | POA: Diagnosis not present

## 2015-02-26 DIAGNOSIS — R519 Headache, unspecified: Secondary | ICD-10-CM

## 2015-02-26 MED ORDER — GADOBENATE DIMEGLUMINE 529 MG/ML IV SOLN
13.0000 mL | Freq: Once | INTRAVENOUS | Status: AC | PRN
  Administered 2015-02-26: 13 mL via INTRAVENOUS

## 2015-03-02 ENCOUNTER — Other Ambulatory Visit: Payer: Self-pay | Admitting: General Surgery

## 2015-03-02 NOTE — Progress Notes (Signed)
Cardiology Office Note   Date:  03/03/2015   ID:  [name], DOB 1940-10-30, MRN [id number]  PCP:  [name], MD  Cardiologist:   Sharol Harness, MD   Chief Complaint  Patient presents with  . New Evaluation    surgery clearance, pt states she has been passing out, pt also c/o of being light headed and dizzy  . Edema    legs ankles and feet      History of Present Illness: [name] is a 74 y.o. female with hypertension, hyperlipidemia, mitral valve prolapse and pancreatic cancer s/p pancreaticoduodenectomy and no evidence of recurrent disease who presents for an evaluation of syncope. [name] reports recurrent syncopal episodes that have been occurring since her teens.  It improved in her early adulthood but recurred in her 8s.  The episodes are sporadic but have been occurring more frequently recently.  The last episode was one month ago.  She passed out and broke her arm.  At the time she was carrying groceries and either tripped or passed out, she isn't sure which.  The following day she was standing in the kitchen when she felt presycopal, turned to try and sit down but the next thing she remembers, she was on the floor.  She denies any preceding chest pain, palpitations or shortness of breath.  The episodes occur several months apart and occur without warning.  There is no loss of bowel or bladder function or tonic-clonic movement.  There is no post-ictal confusion.  Sometimes she has warning symptoms that consist of sweating, shortness of breath and nausea.  She denies palpitations or chest pain.  [name] also reports dizziness with bending over or turning her head side to side.  She does not get any exercise, so she cannot determine if the symptoms are worse with exertion.     She was referred to cardiology for risk assessment prior to hernia surgery.  She also reported the syncopal episodes to her oncologist on 9/6 and was referred to cardiology for  further evaluation.   Past Medical History  Diagnosis Date  . Osteoarthritis   . Hiatal hernia   . Chronic maxillary sinusitis     neti pot  . Depression     alone a lot  . Eustachian tube dysfunction   . Tardive dyskinesia     possibly reglan, vitamin E helps  . Mitral valve prolapse     antibiotics before dental procedures  . GERD (gastroesophageal reflux disease)   . Allergic rhinitis   . Glaucoma   . Hypothyroid   . Hypertension   . Heart murmur     hx. "MVP" -predental antibiotics  . Memory loss     short term memory loss  . Urgency of urination     some UTI in past  . Stroke [facility])     mini storkes left leg paraylsis. patient denies weakness 01/08/14.   . Cancer ([place]) 07/10/13    Pancreatic cancer with MRI scan 06-19-13    Past Surgical History  Procedure Laterality Date  . Knee surgery Left     x 5, total knee Left knee  . 1 baker cyst removed    . Dilation and curettage of uterus      x3  . Lumbar spine surgery      x2  . Abdominal hysterectomy      including ovaries  . Lumbar spine surgery      cyst  .  Joint replacement      LTKA  . Blepharoplasty Bilateral     with cataract surgery  . Eye surgery Right     cataract  . Eus N/A 07/10/2013    Procedure: ESOPHAGEAL ENDOSCOPIC ULTRASOUND (EUS) RADIAL;  Surgeon: Arta Silence, MD;  Location: [facility];  Service: Endoscopy;  Laterality: N/A;  . Fine needle aspiration N/A 07/10/2013    Procedure: FINE NEEDLE ASPIRATION (FNA) LINEAR;  Surgeon: Arta Silence, MD;  Location: [facility];  Service: Endoscopy;  Laterality: N/A;  possible fna  . Back surgery      fusion  . Breast surgery      Biopsy left 2 times  . Colonoscopy w/ polypectomy      2004 last colonoscopy, no polyps  . Laparoscopy N/A 08/07/2013    Procedure: LAPAROSCOPY DIAGNOSTIC;  Surgeon: [name], MD;  Location: [place];  Service: General;  Laterality: N/A;  . Whipple procedure N/A 08/07/2013    Procedure: WHIPPLE PROCEDURE;  Surgeon:  [name], MD;  Location: [place];  Service: General;  Laterality: N/A;  . Esophagogastroduodenoscopy N/A 09/11/2013    Procedure: ESOPHAGOGASTRODUODENOSCOPY (EGD);  Surgeon: Cleotis Nipper, MD;  [facility]: [facility];  Service: Endoscopy;  Laterality: N/A;  Moderate sedation okay if MAC not available     Current Outpatient Prescriptions  Medication Sig Dispense Refill  . acetaminophen (TYLENOL) 325 MG tablet Take 650 mg by mouth every 6 (six) hours as needed for mild pain, moderate pain or fever.    Marland Kitchen aluminum & magnesium hydroxide-simethicone (MYLANTA) 500-450-40 MG/5ML suspension Take 5 mLs by mouth every 6 (six) hours as needed for indigestion.    Marland Kitchen aspirin 81 MG tablet Take 81 mg by mouth at bedtime.     Marland Kitchen atenolol (TENORMIN) 25 MG tablet Take 1 tablet by mouth  every morning 90 tablet 3  . carboxymethylcellulose (REFRESH TEARS) 0.5 % SOLN Place 1 drop into both eyes 2 (two) times daily.     . cholecalciferol (VITAMIN D) 1000 UNITS tablet Take 1,000 Units by mouth 2 (two) times daily.     . cyanocobalamin 2000 MCG tablet Take 2,000 mcg by mouth daily at 12 noon.     . docusate sodium (COLACE) 100 MG capsule Take 200 mg by mouth at bedtime.    . DULoxetine (CYMBALTA) 30 MG capsule One po qam xone week, then 2 tabs po qam (Patient taking differently: Take 60 mg by mouth at bedtime. One po qam xone week, then 2 tabs po qam) 60 capsule 6  . feeding supplement (BOOST HIGH PROTEIN) LIQD Take 1 Container by mouth daily.    Marland Kitchen FLUoxetine (PROZAC) 20 MG capsule Take 1 capsule (20 mg total) by mouth daily. 90 capsule 2  . gabapentin (NEURONTIN) 400 MG capsule Take 2 capsules (800 mg total) by mouth 4 (four) times daily. 240 capsule 6  . levothyroxine (SYNTHROID, LEVOTHROID) 175 MCG tablet Take 1 tablet by mouth  daily before breakfast 90 tablet 2  . lipase/protease/amylase (CREON) 12000 UNITS CPEP capsule Take 2 capsules (24,000 Units total) by mouth 3 (three) times daily before meals. 270 capsule  5  . loratadine (CLARITIN) 10 MG tablet Take 10 mg by mouth daily.    Marland Kitchen lovastatin (MEVACOR) 40 MG tablet Take 1 tablet by mouth at  bedtime 90 tablet 2  . meclizine (ANTIVERT) 25 MG tablet Take 25 mg by mouth 3 (three) times daily as needed for dizziness.    . Multiple Vitamin (MULTIVITAMIN WITH MINERALS) TABS tablet  Take 1 tablet by mouth daily.    . NONFORMULARY OR COMPOUNDED ITEM Doxep 3% Amanta 3% DXM 2% Lidocaine 2% Clonodine 0.2% and Tramadol 2%    . omeprazole (PRILOSEC OTC) 20 MG tablet Take 20 mg by mouth 2 (two) times daily.     . OXcarbazepine (TRILEPTAL) 150 MG tablet Take 150 mg by mouth 2 (two) times daily.    . polycarbophil (FIBERCON) 625 MG tablet Take 625 mg by mouth daily.     . promethazine (PHENERGAN) 25 MG tablet Take 25 mg by mouth every 6 (six) hours as needed for nausea or vomiting.    Marland Kitchen rOPINIRole (REQUIP) 0.5 MG tablet One po qhs xone week, then 2 tabs po qhs (Patient taking differently: Take 0.5 mg by mouth at bedtime. ) 60 tablet 11  . Simethicone 180 MG CAPS Take 180 mg by mouth 2 (two) times daily.     Marland Kitchen UNABLE TO FIND Apply 1 application topically 4 (four) times daily. Doxep/Amanta/DXM/Lidocaine 25 mg for her feet    . vitamin E 400 UNIT capsule Take 800 Units by mouth 2 (two) times daily. For tartive dyskinesia     No current facility-administered medications for this visit.    Allergies:   Other; Metoclopramide hcl; Niacin; Trovan; Nortriptyline; Benzocaine-resorcinol; Celecoxib; Glucosamine; Phenazopyridine hcl; Sulfa antibiotics; and Sulfonamide derivatives    Social History:  The patient  reports that she has never smoked. She has never used smokeless tobacco. She reports that she does not drink alcohol or use illicit drugs.   Family History:  The patient's family history includes Alzheimer's disease in her father; Cancer in her mother; Other (age of onset: 50) in her brother.    ROS:  Please see the history of present illness.   Otherwise, review of  systems are positive for headaches, back pain.   All other systems are reviewed and negative.    PHYSICAL EXAM: VS:  BP 162/88 mmHg  Pulse 79  Ht 5\' 1"  (1.549 m)  Wt 68.176 kg (150 lb 4.8 oz)  BMI 28.41 kg/m2 , BMI Body mass index is 28.41 kg/(m^2). GENERAL:  Well appearing HEENT:  Pupils equal round and reactive, fundi not visualized, oral mucosa unremarkable NECK:  No jugular venous distention, waveform within normal limits, carotid upstroke brisk and symmetric, no bruits, no thyromegaly.  No syncope with carotid massage. LYMPHATICS:  No cervical adenopathy LUNGS:  Clear to auscultation bilaterally HEART:  RRR.  PMI not displaced or sustained,S1 and S2 within normal limits, no S3, no S4, no clicks, no rubs, no murmurs ABD:  Flat, positive bowel sounds normal in frequency in pitch, no bruits, no rebound, no guarding, no midline pulsatile mass, no hepatomegaly, no splenomegaly EXT:  2 plus pulses throughout, no edema, no cyanosis no clubbing SKIN:  No rashes no nodules NEURO:  Cranial nerves II through XII grossly intact, motor grossly intact throughout PSYCH:  Cognitively intact, oriented to person place and time    EKG:  EKG is ordered today. The ekg ordered today demonstrates sinus rhythm at 79 bpm.  Late R wave progression.     Recent Labs: 01/23/2015: ALT 19; BUN 13; Creatinine 0.7; Hemoglobin 13.2; Platelets 150; Potassium 4.0; Sodium 144    Lipid Panel    Component Value Date/Time   CHOL 189 12/20/2006 1055   TRIG 260* 09/02/2013 0510   HDL 43.3 12/20/2006 1055   CHOLHDL 4.4 CALC 12/20/2006 1055   VLDL 56* 12/20/2006 1055   LDLDIRECT 113.1 12/20/2006 1055  Wt Readings from Last 3 Encounters:  03/03/15 68.176 kg (150 lb 4.8 oz)  02/12/15 66.225 kg (146 lb)  02/03/15 66.316 kg (146 lb 3.2 oz)      Other studies Reviewed: Additional studies/ records that were reviewed today include:  Review of the above records demonstrates:  Please see elsewhere in the  note.     ASSESSMENT AND PLAN:  # Syncope: Episodes are sporadic and have minimal warning signs.  Given that this has been ongoing since her teens, it is likely that this is vasovagal or neurocardiogenic syncope.  Ischemia or malignant arrhythmias are very unlikely.  She was orthostatic with a 30 mmHg drop in systolic blood pressure when she went from sitting (166/86) to standing (137/81).  This certainly could be contributing to her symptoms, but likely does not explain it all.  She had no syncope or pre-syncope with carotid massage, making carotid hypersensitivity unlikely.  Her carotid ultrasound showed no stenosis in either carotid artery and antegrade vertebral flow, making vertebrobasilar insufficiency unlikely.  We have recommended that she increase her fluid intake to at least 8, 8oz glasses of water or other fluids daily.  She also should have a more permissive hypertension goal, <150/90.  Continue atenolol for now.  She is on Cymbalta, which can cause syncope and orthostatic hypotension.  We have recommended that she consider stopping this and discussing alternatives with her PCP.  We recommended compression stockings and raising the head of her bed.  # Pre-operative risk assessment: We are unable to adequately assess [name]'s pre-surgical risk, as she does not get any exercise.  Therefore, we will refer her for [facility].   # Memory loss: [name] seemed to have some memory difficulties throughout the interview.  She was accompanied by her daughter in law, who provided many of the details of her history.  [name] continues to drive herself.  Given her memory deficits and her syncope, this is troublesome.  I discussed my concerns with both [name] and her daughter in law.  The agreed to limit her driving to the area immediately around her home.  This should be an ongoing discussion.  # Hyperlipidemia: Continue lovastatin.  Monitored by PCP.  Current medicines are reviewed at  length with the patient today.  The patient does not have concerns regarding medicines.  The following changes have been made:  no change  Labs/ tests ordered today include:   Orders Placed This Encounter  Procedures  . Myocardial Perfusion Imaging  . EKG 12-Lead     Disposition:   FU with [name] Bhullar C. Oval Linsey, MD in 6 months.  Signed, Sharol Harness, MD  03/03/2015 7:54 PM    [place]

## 2015-03-03 ENCOUNTER — Encounter: Payer: Self-pay | Admitting: Cardiovascular Disease

## 2015-03-03 ENCOUNTER — Ambulatory Visit (INDEPENDENT_AMBULATORY_CARE_PROVIDER_SITE_OTHER): Payer: Medicare Other | Admitting: Cardiovascular Disease

## 2015-03-03 ENCOUNTER — Other Ambulatory Visit: Payer: Self-pay | Admitting: Family Medicine

## 2015-03-03 VITALS — BP 162/88 | HR 79 | Ht 61.0 in | Wt 150.3 lb

## 2015-03-03 DIAGNOSIS — R55 Syncope and collapse: Secondary | ICD-10-CM

## 2015-03-03 DIAGNOSIS — R0602 Shortness of breath: Secondary | ICD-10-CM | POA: Diagnosis not present

## 2015-03-03 DIAGNOSIS — I10 Essential (primary) hypertension: Secondary | ICD-10-CM | POA: Diagnosis not present

## 2015-03-03 DIAGNOSIS — E785 Hyperlipidemia, unspecified: Secondary | ICD-10-CM | POA: Diagnosis not present

## 2015-03-03 DIAGNOSIS — R413 Other amnesia: Secondary | ICD-10-CM

## 2015-03-03 DIAGNOSIS — R519 Headache, unspecified: Secondary | ICD-10-CM

## 2015-03-03 DIAGNOSIS — R51 Headache: Principal | ICD-10-CM

## 2015-03-03 NOTE — Patient Instructions (Addendum)
Please stop taking Cymbalta (duloxetine) and talk with your primary care doctor about an alternative medication.  This could be contributing to your passing out episodes.  Your physician has requested that you have a lexiscan myoview. For further information please visit [email or web address]. Please follow instruction sheet, as given.  Dr Oval Linsey recommends that you schedule a follow-up appointment in 6 months. You will receive a reminder letter in the mail two months in advance. If you don't receive a letter, please call our office to schedule the follow-up appointment.

## 2015-03-04 ENCOUNTER — Encounter: Payer: Self-pay | Admitting: Physical Therapy

## 2015-03-04 ENCOUNTER — Ambulatory Visit: Payer: Medicare Other | Attending: Orthopaedic Surgery | Admitting: Physical Therapy

## 2015-03-04 ENCOUNTER — Telehealth (HOSPITAL_COMMUNITY): Payer: Self-pay

## 2015-03-04 DIAGNOSIS — R29898 Other symptoms and signs involving the musculoskeletal system: Secondary | ICD-10-CM | POA: Diagnosis not present

## 2015-03-04 DIAGNOSIS — M25639 Stiffness of unspecified wrist, not elsewhere classified: Secondary | ICD-10-CM

## 2015-03-04 DIAGNOSIS — R279 Unspecified lack of coordination: Secondary | ICD-10-CM | POA: Insufficient documentation

## 2015-03-04 NOTE — Telephone Encounter (Signed)
Encounter complete. 

## 2015-03-04 NOTE — Patient Instructions (Signed)
AROM: Wrist Flexion / Extension  Actively bend right wrist forward then back as far as possible. Repeat ____ times per set. Do ____ sets per session. Do ____ sessions per day.  Copyright  VHI. All rights reserved.  AROM: Wrist Radial / Ulnar Deviation   Gently bend left wrist from side to side as far as possible. Repeat ____ times per set. Do ____ sets per session. Do ____ sessions per day.

## 2015-03-04 NOTE — Therapy (Signed)
[facility] [address], [place], [place], [address] Phone: [phone]   Fax:  [phone]  Physical Therapy Evaluation  Patient Details  Name: [name] MRN: [id number] Date of Birth: 01-26-1941 Referring Provider:  [name], MD  Encounter Date: 03/04/2015      PT End of Session - 03/04/15 1003    Visit Number 1   Date for PT Re-Evaluation 04/29/15   PT Start Time 0930   PT Stop Time 1003   PT Time Calculation (min) 33 min   Activity Tolerance Patient tolerated treatment well   Behavior During Therapy [facility] for tasks assessed/performed      Past Medical History  Diagnosis Date  . Osteoarthritis   . Hiatal hernia   . Chronic maxillary sinusitis     neti pot  . Depression     alone a lot  . Eustachian tube dysfunction   . Tardive dyskinesia     possibly reglan, vitamin E helps  . Mitral valve prolapse     antibiotics before dental procedures  . GERD (gastroesophageal reflux disease)   . Allergic rhinitis   . Glaucoma   . Hypothyroid   . Hypertension   . Heart murmur     hx. "MVP" -predental antibiotics  . Memory loss     short term memory loss  . Urgency of urination     some UTI in past  . Stroke [facility])     mini storkes left leg paraylsis. patient denies weakness 01/08/14.   . Cancer ([place]) 07/10/13    Pancreatic cancer with MRI scan 06-19-13    Past Surgical History  Procedure Laterality Date  . Knee surgery Left     x 5, total knee Left knee  . 1 baker cyst removed    . Dilation and curettage of uterus      x3  . Lumbar spine surgery      x2  . Abdominal hysterectomy      including ovaries  . Lumbar spine surgery      cyst  . Joint replacement      LTKA  . Blepharoplasty Bilateral     with cataract surgery  . Eye surgery Right     cataract  . Eus N/A 07/10/2013    Procedure: ESOPHAGEAL ENDOSCOPIC ULTRASOUND (EUS) RADIAL;  Surgeon: Arta Silence, MD;  [facility]: [facility];   Service: Endoscopy;  Laterality: N/A;  . Fine needle aspiration N/A 07/10/2013    Procedure: FINE NEEDLE ASPIRATION (FNA) LINEAR;  Surgeon: Arta Silence, MD;  [facility];  Service: Endoscopy;  Laterality: N/A;  possible fna  . Back surgery      fusion  . Breast surgery      Biopsy left 2 times  . Colonoscopy w/ polypectomy      2004 last colonoscopy, no polyps  . Laparoscopy N/A 08/07/2013    Procedure: LAPAROSCOPY DIAGNOSTIC;  Surgeon: [name], MD;  Location: [place];  Service: General;  Laterality: N/A;  . Whipple procedure N/A 08/07/2013    Procedure: WHIPPLE PROCEDURE;  Surgeon: [name], MD;  Location: [place];  Service: General;  Laterality: N/A;  . Esophagogastroduodenoscopy N/A 09/11/2013    Procedure: ESOPHAGOGASTRODUODENOSCOPY (EGD);  Surgeon: Cleotis Nipper, MD;  Location: [facility] ENDOSCOPY;  Service: Endoscopy;  Laterality: N/A;  Moderate sedation okay if MAC not available    There were no vitals filed for this visit.  Visit Diagnosis:  Weakness of left upper extremity -  Plan: PT plan of care cert/re-cert  Decreased range of motion of wrist - Plan: PT plan of care cert/re-cert      Subjective Assessment - 03/04/15 0935    Subjective Pt fell in kitchen in July 2016 and fractured L UE. Pt then fell again the next day. Pt suspects she passed out     sL UE set December 29, 2014.   Patient Stated Goals strengthen and increase ROM in L wrist   Currently in Pain? No/denies            [facility] PT Assessment - 03/04/15 0001    Assessment   Medical Diagnosis L distal radius fracture   Onset Date/Surgical Date 12/29/14   Precautions   Precautions Fall   Required Braces or Orthoses --  L wrist splint   Restrictions   Other Position/Activity Restrictions Gentle wrist ROM only!   Balance Screen   Has the patient fallen in the past 6 months Yes   How many times? 2   Has the patient had a decrease in activity level because of a fear of falling?  No   Is the patient  reluctant to leave their home because of a fear of falling?  No   Home Environment   Living Environment Private residence   Living Arrangements Alone   Available Help at Discharge Family   Type of [place] to enter   Entrance Stairs-Number of Steps --  2   Entrance Stairs-Rails None   Home Layout One level   Home Equipment [place] - single point   Cognition   Overall Cognitive Status Within Functional Limits for tasks assessed   Observation/Other Assessments   Observations swelling L distal radius   Focus on Therapeutic Outcomes (FOTO)  37   Sensation   Light Touch Appears Intact   AROM   AROM Assessment Site Wrist   Right/Left Wrist Left   Left Wrist Extension 45 Degrees   Left Wrist Flexion 40 Degrees   Left Wrist Radial Deviation 25 Degrees   Left Wrist Ulnar Deviation 30 Degrees   Palpation   Palpation comment tender at distal radius                   [facility] Adult PT Treatment/Exercise - 03/04/15 0001    Exercises   Exercises Wrist   Wrist Exercises   Wrist Flexion AAROM;Self ROM;Left;10 reps   Wrist Extension AAROM;Self ROM;Left;10 reps   Wrist Radial Deviation AROM;20 reps   Wrist Ulnar Deviation AROM;20 reps                PT Education - 03/04/15 0954    Education provided Yes   Education Details HEP, PT POC   Person(s) Educated Patient   Methods Demonstration;Explanation;Handout   Comprehension Verbalized understanding;Returned demonstration          PT Short Term Goals - 03/04/15 1005    PT SHORT TERM GOAL #1   Title Pt will be independent with initial HEP   Time 4   PT SHORT TERM GOAL #2   Title Pt will increase ROM by 5 degrees in all directions to improve mobility   Time 4   Period Weeks           PT Long Term Goals - 03/04/15 1007    PT LONG TERM GOAL #1   Title Pt iindependent with advanced HEP   Time 8   Period Weeks   PT LONG TERM GOAL #  2   Title Pt will improve FOTO to < = to 28% to demo  improved functional abilities               Plan - 2015/03/22 1004    Clinical Impression Statement Pt with deficits in Lt wrist ROM, will benefit from skilled PT to address deficits in increase ROM to assist with functional daily tasks   Pt will benefit from skilled therapeutic intervention in order to improve on the following deficits Decreased range of motion;Increased edema;Decreased strength   Rehab Potential Good   PT Frequency 1x / week   PT Duration 8 weeks   PT Treatment/Interventions ADLs/Self Care Home Management;Cryotherapy;Ultrasound;Patient/family education;Therapeutic exercise;Therapeutic activities;Manual techniques;Passive range of motion   PT Next Visit Plan assess HEP, await MD order for ability to start strengthening   Consulted and Agree with Plan of Care Patient          G-Codes - 03-22-2015 1009    Functional Assessment Tool Used FOTO   Functional Limitation Carrying, moving and handling objects   Carrying, Moving and Handling Objects Current Status (N4709) At least 20 percent but less than 40 percent impaired, limited or restricted   Carrying, Moving and Handling Objects Goal Status (G2836) At least 20 percent but less than 40 percent impaired, limited or restricted       Problem List Patient Active Problem List   Diagnosis Date Noted  . Fatty liver 09/09/2014  . Paresthesia 07/07/2014  . Lumbago 07/07/2014  . Abnormality of gait 03/27/2014  . Memory loss 01/08/2014  . Exocrine pancreatic insufficiency ([place]) 12/30/2013  . Other abnormal glucose 11/29/2013  . Carcinoma of head of pancreas ([place]) 07/01/2013  . 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 Paresthesia of foot 06/06/2012  . Syncope 09/26/2011  . RHINOSINUSITIS, CHRONIC 02/03/2009  . COLONIC POLYPS 06/05/2008  . OSTEOARTHRITIS 06/04/2008  . DIARRHEA, CHRONIC 04/22/2008  . Low back pain 08/03/2007  . Chronic maxillary sinusitis 04/20/2007  . HYPOTHYROIDISM 02/26/2007   . HYPERLIPIDEMIA 02/26/2007  . ANXIETY 02/26/2007  . DEPRESSION 02/26/2007  . TARDIVE DYSKINESIA 02/26/2007  . GLAUCOMA 02/26/2007  . EUSTACHIAN TUBE DYSFUNCTION 02/26/2007  . HYPERTENSION 02/26/2007  . MITRAL VALVE PROLAPSE 02/26/2007  . ALLERGIC RHINITIS 02/26/2007  . GERD 02/26/2007  . HIATAL HERNIA 02/26/2007  . OSTEOPOROSIS 02/26/2007    Isabelle Course, PT, DPT  2015/03/22, 10:12 AM  [facility] [address] [place], [place], [address] Phone: [phone]   Fax:  [phone]

## 2015-03-05 ENCOUNTER — Encounter: Payer: Self-pay | Admitting: Nurse Practitioner

## 2015-03-05 ENCOUNTER — Ambulatory Visit: Payer: Medicare Other | Admitting: Nurse Practitioner

## 2015-03-05 ENCOUNTER — Ambulatory Visit (INDEPENDENT_AMBULATORY_CARE_PROVIDER_SITE_OTHER): Payer: Medicare Other | Admitting: Nurse Practitioner

## 2015-03-05 VITALS — BP 140/90 | HR 77 | Ht 61.0 in | Wt 148.8 lb

## 2015-03-05 DIAGNOSIS — G609 Hereditary and idiopathic neuropathy, unspecified: Secondary | ICD-10-CM | POA: Diagnosis not present

## 2015-03-05 DIAGNOSIS — R202 Paresthesia of skin: Secondary | ICD-10-CM | POA: Diagnosis not present

## 2015-03-05 NOTE — Progress Notes (Signed)
[facility]  PATIENT: [name] DOB: 05-21-1941   REASON FOR VISIT: Hereditary and idiopathic peripheral neuropathy, paresthesias of both feet, orthostatic hypotension, syncopal episode HISTORY FROM: Patient    HISTORY OF PRESENT ILLNESS: [name], 74 year old female returns for follow-up she was last seen in the office 01/01/2015. She has a long history of small fiber neuropathy and continues to have  pain in her feet. When last seen her symptoms were fairly well controlled on a combination of gabapentin, Requip, Trileptal, Cymbalta, Topamax and compounding cream. Since that time she fell and broke her left wrist questionable syncopal episode. She was taken off the Topamax by her psychiatrist and recently Cymbalta by her cardiologist due to orthostatic hypotension. She remains with orthostatic hypotension today. She is due for a stress test next week. Her feet continue to burn and sting but no more or no less. She has been evaluated by Dr. Ace Gins for neuro stimulation. but she is reluctant to go through this procedure. She returns for reevaluation   UPDATE 01/01/2015 [name], 74 year old female returns for follow-up. She has a long history of small fiber neuropathy and continues to complain burning in the top of her feet and muscle spasms in the calf. She has been to pain management since last seen by Dr. Krista Blue on April 6th 2016. Neuro stimulation was discussed with the patient however she is reluctant to do this. She remains on Requip Trileptal, Topamax and Cymbalta. She is also on a compounding cream. She denies any falls. She does not use an assistive device, she has problems with her housework and cooking because she is not able to stand on her feet for long periods of time to do these things. She returns for reevaluation    HISTORY:[name], 74 year old female returns for followup. Her Primary Care physician is Dr. Yong Channel. She continues to I have seen her over  the past few years for bilateral lower extremity pain, bilateral feet paresthesia She had past medical history of hypertension, hyperlipidemia, depression, is on polypharmacy treatment, including Prozac 20 mg every day, Ativan 3 times a day, Topamax 200 mg twice a day,  SRichardhe presented with more than 10 years history of bilateral feet paresthesia, getting worse since 2010, she described bilateral feet numbness tingling, going up to her legs, her feet felt like it was broken sometimes, her feet burns, ice pack helps some, but sometimes she put on socks, wrapping tightly around her socks, which seems to help her symptoms some, her feet pain getting worse after bearing weight.  She also has a history of chronic low back pain, had a history of low back decompression surgery by Dr. Lorin Mercy in 2004, she denies shooting pain to her neck, no bowel bladder incontinence, mild gait difficulty due to feet pain, no significant weakness  Most recent laboratory evaluation showed normal vitamin B12, TSH, CMP, elevated WBC 15.7 CBC, RPR, ANA, vitamin B12, TSH, mild elevated C. reactive protein of 11.2,  She has tried compounding cream, which has made her skin peel, she is taking gabapentin 300 mg 3 times a day, which helped her 60%, also make her tired, the most bothersome symptoms at nighttime, she complains of bilateral plantar feet burning pain, she has to wrap her toes together, soak it in hot water, which only helped her temporarily.  Over the past few years, she also developed gradual worsening gait difficulty, she presented with right thoracic area pain, eventually was diagnosed with pancreatic cancer, had a Whipple procedure  in March 2015, no chemo, radiation therapy  EMG nerve conduction study in June 2014, showed no large fiber peripheral neuropathy, no evidence of lumbar radiculopathy  We have reviewed MRI lumbar in November 2015, 1. At L3-4: disc bulging and facet hypertrophy with moderate  biforaminal stenosis  2. At L2-3: disc bulging and facet hypertrophy with mild biforaminal stenosis  3. Posterior decompression and interbody fusion from L4-L5-S1 with metal screws and adjoining hardware. She could not tolerate nortriptyline, complains of nausea,  UPDATE Feb 8th 2016: She continues to complains of needle sticking pain at the top of her feet, feet muscle, and calf muscle spasm,  Skin biopsy was abnormal significantly decreased nerve fiber density at the right thigh, right foot, consistent with small fiber neuropathy, epidermal nerve fiber density was normal in the right calf, right foot showed normal sweat gland nerve fiber density.   REVIEW OF SYSTEMS: Full 14 system review of systems performed and notable only for those listed, all others are neg:  Constitutional: neg  Cardiovascular: neg Ear/Nose/Throat: neg  Skin: neg Eyes: Blurred vision  Respiratory: neg Gastroitestinal: neg  Hematology/Lymphatic: neg  Endocrine: neg Musculoskeletal:neg Allergy/Immunology: neg Neurological: Syncopal episode Psychiatric: neg Sleep : neg   ALLERGIES: Allergies  Allergen Reactions  . Other Other (See Comments)    According to patient Trilafor and Reglan cause Tardive Dyskinesia  . Metoclopramide Hcl Other (See Comments)    Shaking; caused tardive dyskinesia  . Niacin Other (See Comments)    shaking  . Trovan [Alatrofloxacin Mesylate] Other (See Comments)    Caused shaking and nervousness  . Nortriptyline Other (See Comments)    Dizziness   . Benzocaine-Resorcinol Rash  . Celecoxib Other (See Comments)    unknown  . Glucosamine Other (See Comments)    unknown  . Phenazopyridine Hcl Other (See Comments)    unknown  . Sulfa Antibiotics Nausea And Vomiting and Rash  . Sulfonamide Derivatives Nausea And Vomiting and Rash    HOME MEDICATIONS: Outpatient Prescriptions Prior to Visit  Medication Sig Dispense Refill  . acetaminophen (TYLENOL) 325 MG tablet Take 650  mg by mouth every 6 (six) hours as needed for mild pain, moderate pain or fever.    Marland Kitchen aluminum & magnesium hydroxide-simethicone (MYLANTA) 500-450-40 MG/5ML suspension Take 5 mLs by mouth every 6 (six) hours as needed for indigestion.    Marland Kitchen aspirin 81 MG tablet Take 81 mg by mouth at bedtime.     Marland Kitchen atenolol (TENORMIN) 25 MG tablet Take 1 tablet by mouth  every morning 90 tablet 3  . carboxymethylcellulose (REFRESH TEARS) 0.5 % SOLN Place 1 drop into both eyes 2 (two) times daily.     . cholecalciferol (VITAMIN D) 1000 UNITS tablet Take 1,000 Units by mouth 2 (two) times daily.     . cyanocobalamin 2000 MCG tablet Take 2,000 mcg by mouth daily at 12 noon.     . docusate sodium (COLACE) 100 MG capsule Take 200 mg by mouth at bedtime.    . DULoxetine (CYMBALTA) 30 MG capsule One po qam xone week, then 2 tabs po qam (Patient taking differently: Take 60 mg by mouth at bedtime. One po qam xone week, then 2 tabs po qam) 60 capsule 6  . feeding supplement (BOOST HIGH PROTEIN) LIQD Take 1 Container by mouth daily.    Marland Kitchen FLUoxetine (PROZAC) 20 MG capsule Take 1 capsule (20 mg total) by mouth daily. 90 capsule 2  . gabapentin (NEURONTIN) 400 MG capsule Take 2 capsules (800 mg  total) by mouth 4 (four) times daily. 240 capsule 6  . levothyroxine (SYNTHROID, LEVOTHROID) 175 MCG tablet Take 1 tablet by mouth  daily before breakfast 90 tablet 2  . lipase/protease/amylase (CREON) 12000 UNITS CPEP capsule Take 2 capsules (24,000 Units total) by mouth 3 (three) times daily before meals. 270 capsule 5  . loratadine (CLARITIN) 10 MG tablet Take 10 mg by mouth daily.    Marland Kitchen lovastatin (MEVACOR) 40 MG tablet Take 1 tablet by mouth at  bedtime 90 tablet 2  . meclizine (ANTIVERT) 25 MG tablet Take 25 mg by mouth 3 (three) times daily as needed for dizziness.    . Multiple Vitamin (MULTIVITAMIN WITH MINERALS) TABS tablet Take 1 tablet by mouth daily.    . NONFORMULARY OR COMPOUNDED ITEM Doxep 3% Amanta 3% DXM 2% Lidocaine 2%  Clonodine 0.2% and Tramadol 2%    . omeprazole (PRILOSEC OTC) 20 MG tablet Take 20 mg by mouth 2 (two) times daily.     . OXcarbazepine (TRILEPTAL) 150 MG tablet Take 150 mg by mouth 2 (two) times daily.    . polycarbophil (FIBERCON) 625 MG tablet Take 625 mg by mouth daily.     . promethazine (PHENERGAN) 25 MG tablet Take 25 mg by mouth every 6 (six) hours as needed for nausea or vomiting.    Marland Kitchen rOPINIRole (REQUIP) 0.5 MG tablet One po qhs xone week, then 2 tabs po qhs (Patient taking differently: Take 0.5 mg by mouth at bedtime. ) 60 tablet 11  . Simethicone 180 MG CAPS Take 180 mg by mouth 2 (two) times daily.     Marland Kitchen UNABLE TO FIND Apply 1 application topically 4 (four) times daily. Doxep/Amanta/DXM/Lidocaine 25 mg for her feet    . vitamin E 400 UNIT capsule Take 800 Units by mouth 2 (two) times daily. For tartive dyskinesia     No facility-administered medications prior to visit.    PAST MEDICAL HISTORY: Past Medical History  Diagnosis Date  . Osteoarthritis   . Hiatal hernia   . Chronic maxillary sinusitis     neti pot  . Depression     alone a lot  . Eustachian tube dysfunction   . Tardive dyskinesia     possibly reglan, vitamin E helps  . Mitral valve prolapse     antibiotics before dental procedures  . GERD (gastroesophageal reflux disease)   . Allergic rhinitis   . Glaucoma   . Hypothyroid   . Hypertension   . Heart murmur     hx. "MVP" -predental antibiotics  . Memory loss     short term memory loss  . Urgency of urination     some UTI in past  . Stroke [facility])     mini storkes left leg paraylsis. patient denies weakness 01/08/14.   . Cancer ([place]) 07/10/13    Pancreatic cancer with MRI scan 06-19-13    PAST SURGICAL HISTORY: Past Surgical History  Procedure Laterality Date  . Knee surgery Left     x 5, total knee Left knee  . 1 baker cyst removed    . Dilation and curettage of uterus      x3  . Lumbar spine surgery      x2  . Abdominal hysterectomy       including ovaries  . Lumbar spine surgery      cyst  . Joint replacement      LTKA  . Blepharoplasty Bilateral     with cataract surgery  . Eye  surgery Right     cataract  . Eus N/A 07/10/2013    Procedure: ESOPHAGEAL ENDOSCOPIC ULTRASOUND (EUS) RADIAL;  Surgeon: Arta Silence, MD;  Location: [facility];  Service: Endoscopy;  Laterality: N/A;  . Fine needle aspiration N/A 07/10/2013    Procedure: FINE NEEDLE ASPIRATION (FNA) LINEAR;  Surgeon: Arta Silence, MD;  Location: [facility] ENDOS[facility];  Service: Endoscopy;  Laterality: N/A;  possible fna  . Back surgery      fusion  . Breast surgery      Biopsy left 2 times  . Colonoscopy w/ polypectomy      2004 last colonoscopy, no polyps  . Laparoscopy N/A 08/07/2013    Procedure: LAPAROSCOPY DIAGNOSTIC;  Surgeon: [name], MD;  Location: [place];  Service: General;  Laterality: N/A;  . Whipple procedure N/A 08/07/2013    Procedure: WHIPPLE PROCEDURE;  Surgeon: [name], MD;  Location: [place];  Service: General;  Laterality: N/A;  . Esophagogastroduodenoscopy N/A 09/11/2013    Procedure: ESOPHAGOGASTRODUODENOSCOPY (EGD);  Surgeon: Cleotis Nipper, MD;  Location: [facility] ENDOSCOPY;  Service: Endoscopy;  Laterality: N/A;  Moderate sedation okay if MAC not available    FAMILY HISTORY: Family History  Problem Relation Age of Onset  . Cancer Mother     Breast Cancer with Metastatic disease  . Alzheimer's disease Father   . Other Brother 17    GSW    SOCIAL HISTORY: Social History   Social History  . Marital Status: Widowed    Spouse Name: N/A  . Number of Children: 2  . Years of Education: 8   Occupational History  .    Marland Kitchen Retired    Social History Main Topics  . Smoking status: Never Smoker   . Smokeless tobacco: Never Used  . Alcohol Use: No  . Drug Use: No  . Sexual Activity: Not Currently   Other Topics Concern  . 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 Generalized: Well developed, in no acute distress  Head: normocephalic and atraumatic,. Oropharynx benign  Neck: Supple, no carotid bruits ,  Neurological examination  Mentation: Alert oriented to time, place, history taking. Follows all commands speech and language fluent.  Cranial nerve II-XII: .Pupils were equal round reactive to light extraocular movements were full, visual field were full on confrontational test. Facial sensation and strength were normal. hearing was intact to finger rubbing bilaterally. Uvula tongue midline. head turning and shoulder shrug were normal and symmetric.Tongue protrusion into cheek strength was normal.  Motor: normal bulk and tone, full strength in the BUE, BLE, No focal weakness, tenderness of bilateral feet upon deep palpation  Brace to the left forearm  and wrist. Sensory: Length dependent decreased fine touch, pinprick to mid shin level bilaterally, decreased vibratory to toes, preserved proprioception  Coordination: finger-nose-finger, heel-to-shin bilaterally, no dysmetria  Reflexes: Brachioradialis 2/2, biceps 2/2, triceps 2/2, patellar 2/2, Achilles trace, plantar responses were flexor bilaterally.  Gait and Station: Rising up from seated position without assistance, cautious, narrow based, was able to stand on tiptoe, and heels, tandem gait is unsteady no assistive device     DIAGNOSTIC DATA (LABS, IMAGING, TESTING) - I reviewed patient records, labs, notes, testing and imaging myself where available.  Lab Results  Component Value Date   WBC 6.8 01/23/2015   HGB 13.2 01/23/2015   HCT 39 01/23/2015   MCV 96.1 09/02/2014   PLT 150 01/23/2015      Component Value Date/Time   NA 144 01/23/2015   NA 137 09/02/2014 1458   K 4.0 01/23/2015   CL 104 09/02/2014 1458   CO2 29 09/02/2014 1458   GLUCOSE 76 09/02/2014 1458   BUN 13 01/23/2015   BUN 16 09/02/2014 1458   CREATININE 0.7 01/23/2015   CREATININE 0.78 09/02/2014 1458   CREATININE 0.81 07/01/2013 1324   CALCIUM 9.7 09/02/2014 1458   PROT 7.1 09/02/2014 1458   PROT 6.5 07/07/2014 1246   ALBUMIN 4.3 09/02/2014 1458   AST 19 01/23/2015   ALT 19 01/23/2015   ALKPHOS 108 01/23/2015   BILITOT 0.4 09/02/2014 1458   GFRNONAA 68* 02/15/2014 1445   GFRAA 79* 02/15/2014 1445    Lab Results  Component Value Date   HGBA1C 5.2 07/07/2014        ASSESSMENT AND PLAN 74 y.o. year old female has a past medical history of bilateral foot paresthesias for greater than 10 years. Mild length dependent sensory changes most consistent with small fiber neuropathy. Previous Electrodiagnostic study was normal without evidence of large fiber peripheral neuropathy. She also complains of low back pain had previous lumbar decompression surgery. Skin biopsy with small fiber neuropathy. Now with orthostatic hypotension and Cymbalta was discontinued by her cardiologist. Topamax was discontinued by her psychiatrist. She has had one fall questionable syncopal episode  Will continue  gabapentin to 400 mg 2 tablets 4 times daily (3200mg ) daily Continue Requip, Trileptal,  and compounding cream  I spent additional 10 minutes in total face to face time with the patient more than 50% of which was spent counseling and coordination of care, reviewing medications and discussing and reviewing the diagnosis of small fiber  neuropathy and the report from [place] which recommended neuro stimulation. Patient is reluctant to do this. We have tried multiple medications for her neuropathy. Will await results of stress testing. May do EEG later date syncopal episode versus seizure. May also try  LyricaVst time 28 min Dennie Bible, [facility], [facility], APRN  [facility] [address] [place], [place] [address] [phone]

## 2015-03-05 NOTE — Patient Instructions (Signed)
Continue  gabapentin to 400 mg 2 tablets 4 times daily RX to patient Continue Requip, Trileptal, at current doses Cymbalta discontinued by cardiology due to orthostasis,  Topamax discontinue by psych Continue compounding cream  F/U as planned

## 2015-03-05 NOTE — Progress Notes (Signed)
I have reviewed and agreed above plan. 

## 2015-03-06 ENCOUNTER — Other Ambulatory Visit: Payer: Medicare Other

## 2015-03-08 ENCOUNTER — Other Ambulatory Visit: Payer: Self-pay | Admitting: Neurology

## 2015-03-09 ENCOUNTER — Ambulatory Visit
Admission: RE | Admit: 2015-03-09 | Discharge: 2015-03-09 | Disposition: A | Discharge status: Home / Self Care | Payer: Medicare Other | Source: Ambulatory Visit | Attending: General Surgery | Admitting: General Surgery

## 2015-03-09 DIAGNOSIS — K439 Ventral hernia without obstruction or gangrene: Secondary | ICD-10-CM | POA: Diagnosis not present

## 2015-03-09 DIAGNOSIS — K760 Fatty (change of) liver, not elsewhere classified: Secondary | ICD-10-CM | POA: Diagnosis not present

## 2015-03-09 DIAGNOSIS — C251 Malignant neoplasm of body of pancreas: Secondary | ICD-10-CM

## 2015-03-09 MED ORDER — IOPAMIDOL (ISOVUE-300) INJECTION 61%
100.0000 mL | Freq: Once | INTRAVENOUS | Status: AC | PRN
  Administered 2015-03-09: 100 mL via INTRAVENOUS

## 2015-03-10 ENCOUNTER — Ambulatory Visit (HOSPITAL_COMMUNITY)
Admission: RE | Admit: 2015-03-10 | Discharge: 2015-03-10 | Disposition: A | Discharge status: Home / Self Care | Payer: Medicare Other | Source: Ambulatory Visit | Attending: Cardiovascular Disease | Admitting: Cardiovascular Disease

## 2015-03-10 ENCOUNTER — Telehealth (HOSPITAL_COMMUNITY): Payer: Self-pay

## 2015-03-10 DIAGNOSIS — R0602 Shortness of breath: Secondary | ICD-10-CM

## 2015-03-10 DIAGNOSIS — R42 Dizziness and giddiness: Secondary | ICD-10-CM | POA: Insufficient documentation

## 2015-03-10 DIAGNOSIS — R55 Syncope and collapse: Secondary | ICD-10-CM | POA: Diagnosis not present

## 2015-03-10 DIAGNOSIS — I252 Old myocardial infarction: Secondary | ICD-10-CM | POA: Insufficient documentation

## 2015-03-10 DIAGNOSIS — I10 Essential (primary) hypertension: Secondary | ICD-10-CM | POA: Diagnosis not present

## 2015-03-10 LAB — MYOCARDIAL PERFUSION IMAGING
CHL CUP NUCLEAR SDS: 0
CHL CUP RESTING HR STRESS: 67 {beats}/min
CSEPPHR: 95 {beats}/min
LV dias vol: 78 mL
LVSYSVOL: 35 mL
SRS: 0
SSS: 0
TID: 1.11

## 2015-03-10 MED ORDER — AMINOPHYLLINE 25 MG/ML IV SOLN
75.0000 mg | Freq: Once | INTRAVENOUS | Status: AC
  Administered 2015-03-10: 75 mg via INTRAVENOUS

## 2015-03-10 MED ORDER — TECHNETIUM TC 99M SESTAMIBI GENERIC - CARDIOLITE
31.2000 | Freq: Once | INTRAVENOUS | Status: AC | PRN
  Administered 2015-03-10: 31.2 via INTRAVENOUS

## 2015-03-10 MED ORDER — REGADENOSON 0.4 MG/5ML IV SOLN
0.4000 mg | Freq: Once | INTRAVENOUS | Status: AC
  Administered 2015-03-10: 0.4 mg via INTRAVENOUS

## 2015-03-10 MED ORDER — TECHNETIUM TC 99M SESTAMIBI GENERIC - CARDIOLITE
10.9000 | Freq: Once | INTRAVENOUS | Status: AC | PRN
  Administered 2015-03-10: 10.9 via INTRAVENOUS

## 2015-03-10 NOTE — Progress Notes (Signed)
Quick Note:  Please let patient know that the hernias are unchanged. There is no evidence of recurrent cancer. ______

## 2015-03-10 NOTE — Telephone Encounter (Signed)
Encounter complete. 

## 2015-03-12 ENCOUNTER — Telehealth: Payer: Self-pay | Admitting: *Deleted

## 2015-03-12 NOTE — Telephone Encounter (Signed)
Spoke to patient. Result given . Verbalized understanding ROUTED TO DR BYERLY- patient thinks she is the surgeon will be doing the hernia surgery

## 2015-03-12 NOTE — Telephone Encounter (Signed)
-----   Message from Skeet Latch, MD sent at 03/10/2015 10:15 PM EDT ----- [name] is at low risk of peri-operative MI or cardiac arrest (0.24%).  Please provide a surgical clearance note.

## 2015-03-16 ENCOUNTER — Ambulatory Visit: Payer: Medicare Other | Admitting: Rehabilitation

## 2015-03-16 ENCOUNTER — Encounter: Payer: Self-pay | Admitting: Rehabilitation

## 2015-03-16 DIAGNOSIS — R29898 Other symptoms and signs involving the musculoskeletal system: Secondary | ICD-10-CM

## 2015-03-16 DIAGNOSIS — M25639 Stiffness of unspecified wrist, not elsewhere classified: Secondary | ICD-10-CM

## 2015-03-16 NOTE — Therapy (Signed)
[facility] [address] [place], [place], [address] Phone: [phone]   Fax:  [phone]  Physical Therapy Treatment  Patient Details  Name: [name] MRN: [id number] Date of Birth: January 17, 1941 No Data Recorded  Encounter Date: 03/16/2015      PT End of Session - 03/16/15 1414    Visit Number 2   Number of Visits 17   Date for PT Re-Evaluation 04/29/15   Authorization Type Medicare [place] code every 10th visit   PT Start Time 1400   PT Stop Time 1430   PT Time Calculation (min) 30 min   Activity Tolerance Patient tolerated treatment well      Past Medical History  Diagnosis Date  . Osteoarthritis   . Hiatal hernia   . Chronic maxillary sinusitis     neti pot  . Depression     alone a lot  . Eustachian tube dysfunction   . Tardive dyskinesia     possibly reglan, vitamin E helps  . Mitral valve prolapse     antibiotics before dental procedures  . GERD (gastroesophageal reflux disease)   . Allergic rhinitis   . Glaucoma   . Hypothyroid   . Hypertension   . Heart murmur     hx. "MVP" -predental antibiotics  . Memory loss     short term memory loss  . Urgency of urination     some UTI in past  . Stroke [facility])     mini storkes left leg paraylsis. patient denies weakness 01/08/14.   . Cancer ([place]) 07/10/13    Pancreatic cancer with MRI scan 06-19-13    Past Surgical History  Procedure Laterality Date  . Knee surgery Left     x 5, total knee Left knee  . 1 baker cyst removed    . Dilation and curettage of uterus      x3  . Lumbar spine surgery      x2  . Abdominal hysterectomy      including ovaries  . Lumbar spine surgery      cyst  . Joint replacement      LTKA  . Blepharoplasty Bilateral     with cataract surgery  . Eye surgery Right     cataract  . Eus N/A 07/10/2013    Procedure: ESOPHAGEAL ENDOSCOPIC ULTRASOUND (EUS) RADIAL;  Surgeon: Arta Silence, MD;  [facility]  END[facility];  Service: Endoscopy;  Laterality: N/A;  . Fine needle aspiration N/A 07/10/2013    Procedure: FINE NEEDLE ASPIRATION (FNA) LINEAR;  Surgeon: Arta Silence, MD;  [facility];  Service: Endoscopy;  Laterality: N/A;  possible fna  . Back surgery      fusion  . Breast surgery      Biopsy left 2 times  . Colonoscopy w/ polypectomy      2004 last colonoscopy, no polyps  . Laparoscopy N/A 08/07/2013    Procedure: LAPAROSCOPY DIAGNOSTIC;  Surgeon: [name], MD;  Location: [place];  Service: General;  Laterality: N/A;  . Whipple procedure N/A 08/07/2013    Procedure: WHIPPLE PROCEDURE;  Surgeon: [name], MD;  Location: [place];  Service: General;  Laterality: N/A;  . Esophagogastroduodenoscopy N/A 09/11/2013    Procedure: ESOPHAGOGASTRODUODENOSCOPY (EGD);  Surgeon: Cleotis Nipper, MD;  Location: [facility] ENDOSCOPY;  Service: Endoscopy;  Laterality: N/A;  Moderate sedation okay if MAC not available    There were no vitals filed for this visit.  Visit Diagnosis:  Weakness of left  upper extremity  Decreased range of motion of wrist      Subjective Assessment - 03/16/15 1358    Subjective not feeling as sore.  has not really been doing exercises, not sure if doing correctly.  Going to see the MD tomorrow   Currently in Pain? No/denies  possibly sore from cast                         [facility] Adult PT Treatment/Exercise - 03/16/15 0001    Exercises   Exercises Shoulder   Elbow Exercises   Forearm Supination PROM;AROM;AAROM;10 reps  2 sets   Forearm Pronation Limitations same per supination   Wrist Flexion AAROM;AROM;PROM;5 reps  2 sets in seated   Wrist Extension PROM;AROM;AAROM;10 reps  2 sets   Shoulder Exercises: Seated   Retraction AROM;20 reps  3" hold   Wrist Exercises   Wrist Radial Deviation AROM;AAROM;PROM;10 reps  2 sets   Wrist Ulnar Deviation PROM;AROM;AAROM;10 reps  2 sets   Other wrist exercises small ball squeeze x 10   Other wrist  exercises seated elbow flexion/ext x 15   Manual Therapy   Manual therapy comments PROM wrist motion gentle per precautions per above.                    PT Short Term Goals - 03/04/15 1005    PT SHORT TERM GOAL #1   Title Pt will be independent with initial HEP   Time 4   PT SHORT TERM GOAL #2   Title Pt will increase ROM by 5 degrees in all directions to improve mobility   Time 4   Period Weeks           PT Long Term Goals - 03/04/15 1007    PT LONG TERM GOAL #1   Title Pt iindependent with advanced HEP   Time 8   Period Weeks   PT LONG TERM GOAL #2   Title Pt will improve FOTO to < = to 28% to demo improved functional abilities               Plan - 03/16/15 1432    Clinical Impression Statement Pt tolerated PROM with a slight increase in pain per patient "sore".  Performing HEP correctly after instruction again.  Will see MD tomorrow    PT Next Visit Plan check how MD appointment went.          Problem List Patient Active Problem List   Diagnosis Date Noted  . Fatty liver 09/09/2014  . Paresthesia 07/07/2014  . Lumbago 07/07/2014  . Abnormality of gait 03/27/2014  . Memory loss 01/08/2014  . 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 Other abnormal glucose 11/29/2013  . Carcinoma of head of pancreas ([place]) 07/01/2013  . 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 Paresthesia of foot 06/06/2012  . Syncope 09/26/2011  . RHINOSINUSITIS, CHRONIC 02/03/2009  . COLONIC POLYPS 06/05/2008  . OSTEOARTHRITIS 06/04/2008  . DIARRHEA, CHRONIC 04/22/2008  . Low back pain 08/03/2007  . Chronic maxillary sinusitis 04/20/2007  . HYPOTHYROIDISM 02/26/2007  . HYPERLIPIDEMIA 02/26/2007  . ANXIETY 02/26/2007  . DEPRESSION 02/26/2007  . TARDIVE DYSKINESIA 02/26/2007  . GLAUCOMA 02/26/2007  . EUSTACHIAN TUBE DYSFUNCTION 02/26/2007  . HYPERTENSION 02/26/2007  . MITRAL VALVE PROLAPSE 02/26/2007  . ALLERGIC RHINITIS 02/26/2007   . GERD 02/26/2007  . HIATAL HERNIA 02/26/2007  . OSTEOPOROSIS 02/26/2007    Stark Bray, DPT, CMP 03/16/2015,  2:34 PM  [facility] [address], [place], [place], [address] Phone: [phone]   Fax:  [phone]  Name: [name] MRN: [id number] Date of Birth: 04/24/1941

## 2015-03-17 DIAGNOSIS — S52592D Other fractures of lower end of left radius, subsequent encounter for closed fracture with routine healing: Secondary | ICD-10-CM | POA: Diagnosis not present

## 2015-03-24 DIAGNOSIS — F332 Major depressive disorder, recurrent severe without psychotic features: Secondary | ICD-10-CM | POA: Diagnosis not present

## 2015-03-25 ENCOUNTER — Ambulatory Visit: Payer: Medicare Other | Admitting: Physical Therapy

## 2015-03-25 ENCOUNTER — Encounter: Payer: Self-pay | Admitting: Physical Therapy

## 2015-03-25 DIAGNOSIS — R279 Unspecified lack of coordination: Secondary | ICD-10-CM | POA: Diagnosis not present

## 2015-03-25 DIAGNOSIS — R29898 Other symptoms and signs involving the musculoskeletal system: Secondary | ICD-10-CM

## 2015-03-25 DIAGNOSIS — M25639 Stiffness of unspecified wrist, not elsewhere classified: Secondary | ICD-10-CM

## 2015-03-25 NOTE — Therapy (Signed)
[facility] [address], [place], [place], [address] Phone: [phone]   Fax:  [phone]  Physical Therapy Treatment  Patient Details  Name: [name] MRN: [id number] Date of Birth: [birth year] No Data Recorded  Encounter Date: 03/25/2015      PT End of Session - 03/25/15 1442    Visit Number 3   Number of Visits 17   Date for PT Re-Evaluation 04/29/15   Authorization Type Medicare Triad-G code every 10th visit   PT Start Time 1400   PT Stop Time 1444   PT Time Calculation (min) 44 min   Activity Tolerance Patient tolerated treatment well   Behavior During Therapy [facility] for tasks assessed/performed      Past Medical History  Diagnosis Date  . Osteoarthritis   . Hiatal hernia   . Chronic maxillary sinusitis     neti pot  . Depression     alone a lot  . Eustachian tube dysfunction   . Tardive dyskinesia     possibly reglan, vitamin E helps  . Mitral valve prolapse     antibiotics before dental procedures  . GERD (gastroesophageal reflux disease)   . Allergic rhinitis   . Glaucoma   . Hypothyroid   . Hypertension   . Heart murmur     hx. "MVP" -predental antibiotics  . Memory loss     short term memory loss  . Urgency of urination     some UTI in past  . Stroke [facility])     mini storkes left leg paraylsis. patient denies weakness 01/08/14.   . Cancer ([name]) 07/10/13    Pancreatic cancer with MRI scan 06-19-13    Past Surgical History  Procedure Laterality Date  . Knee surgery Left     x 5, total knee Left knee  . 1 baker cyst removed    . Dilation and curettage of uterus      x3  . Lumbar spine surgery      x2  . Abdominal hysterectomy      including ovaries  . Lumbar spine surgery      cyst  . Joint replacement      LTKA  . Blepharoplasty Bilateral     with cataract surgery  . Eye surgery Right     cataract  . Eus N/A 07/10/2013    Procedure: ESOPHAGEAL ENDOSCOPIC ULTRASOUND (EUS)  RADIAL;  Surgeon: Arta Silence, MD;  [facility];  Service: Endoscopy;  Laterality: N/A;  . Fine needle aspiration N/A 07/10/2013    Procedure: FINE NEEDLE ASPIRATION (FNA) LINEAR;  Surgeon: Arta Silence, MD;  [facility];  Service: Endoscopy;  Laterality: N/A;  possible fna  . Back surgery      fusion  . Breast surgery      Biopsy left 2 times  . Colonoscopy w/ polypectomy      2004 last colonoscopy, no polyps  . Laparoscopy N/A 08/07/2013    Procedure: LAPAROSCOPY DIAGNOSTIC;  Surgeon: [name], MD;  Location: [place];  Service: General;  Laterality: N/A;  . Whipple procedure N/A 08/07/2013    Procedure: WHIPPLE PROCEDURE;  Surgeon: [name], MD;  Location: [place];  Service: General;  Laterality: N/A;  . Esophagogastroduodenoscopy N/A 09/11/2013    Procedure: ESOPHAGOGASTRODUODENOSCOPY (EGD);  Surgeon: Cleotis Nipper, MD;  Location: [facility];  Service: Endoscopy;  Laterality: N/A;  Moderate sedation okay if MAC not available    There were no vitals filed for  this visit.  Visit Diagnosis:  Weakness of left upper extremity  Decreased range of motion of wrist  Lack of coordination      Subjective Assessment - 03/25/15 1405    Subjective Patient saw MD yesterday he is pleased and she does not need to return to MD. Pt ahs no complain of pain in left wrist. Pt wearing her soft brace for protection when going out of the house   Pertinent History Left distal WRIST fracture on 12/29/2014 conservativeCVA, pancreatic cancer, hiatal hernia, tardive dyskinesia, L knee replacement   Patient Stated Goals strengthen and increase ROM in L wrist   Currently in Pain? No/denies   Multiple Pain Sites No                         [facility] Adult PT Treatment/Exercise - 03/25/15 0001    Exercises   Exercises Shoulder   Elbow Exercises   Forearm Supination AROM;Both;20 reps;10 reps;Other (comment)  with 1#   Forearm Pronation Limitations same p   Wrist  Flexion AROM  not able to perform with 1# - due to causes pain in wrist   Wrist Extension AROM;20 reps;10 reps  forearm supported on table   Shoulder Exercises: Pulleys   Flexion 3 minutes   Wrist Exercises   Wrist Radial Deviation 20 reps;AAROM;AROM   Wrist Ulnar Deviation 20 reps;AAROM;AROM   Other wrist exercises small ball squeeze x 10  AAROM wrist extension / flexion x 10,    Other wrist exercises seated elbow flexion with 1#    Manual Therapy   Manual Therapy Soft tissue mobilization   Manual therapy comments PROM wrist motion gentle per precautions per above.     Soft tissue mobilization along left forearm extensors and hand, pt with tightness and TP in extensors                  PT Short Term Goals - 03/25/15 1445    PT SHORT TERM GOAL #1   Title Pt will be independent with initial HEP   Baseline --   Time 4   Period Weeks   Status On-going   PT SHORT TERM GOAL #2   Title Pt will increase wrist ROM by 5 degrees in all directions to improve mobility   Time 4   Period Weeks   Status On-going   PT SHORT TERM GOAL #3   Title --   PT SHORT TERM GOAL #4   Title --           PT Long Term Goals - 03/25/15 1446    PT LONG TERM GOAL #1   Title Pt iindependent with advanced HEP   Time 8   Period Weeks   Status On-going   PT LONG TERM GOAL #2   Title Pt will improve FOTO to < = to 28% to demo improved functional abilities   Time 8   Period Weeks   Status On-going   PT LONG TERM GOAL #3   Title --   PT LONG TERM GOAL #4   Title --   PT LONG TERM GOAL #5   Title -               Plan - 03/25/15 1443    Clinical Impression Statement Pt tolerated PROM and AROM well, no complain of pain. Pt with limited ROM into wrist extension, flexion, radial and ulna deviation. Pt will continue to benefit from skilled PT to adress ROM  strength and endurance left wrist   Pt will benefit from skilled therapeutic intervention in order to improve on the following  deficits Decreased range of motion;Increased edema;Decreased strength   Rehab Potential Good   Clinical Impairments Affecting Rehab Potential cognitive/memory impairment   PT Frequency 1x / week   PT Duration 8 weeks   PT Treatment/Interventions ADLs/Self Care Home Management;Cryotherapy;Ultrasound;Patient/family education;Therapeutic exercise;Therapeutic activities;Manual techniques;Passive range of motion   PT Next Visit Plan continhue with ROM, gentle strength, soft tissue work   Oncologist with Plan of Care Patient        Problem List Patient Active Problem List   Diagnosis Date Noted  . Fatty liver 09/09/2014  . Paresthesia 07/07/2014  . Lumbago 07/07/2014  . Abnormality of gait 03/27/2014  . Memory loss 01/08/2014  . Exocrine pancreatic insufficiency ([place]) 12/30/2013  . Other abnormal glucose 11/29/2013  . Carcinoma of head of pancreas ([place]) 07/01/2013  . 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 Paresthesia of foot 06/06/2012  . Syncope 09/26/2011  . RHINOSINUSITIS, CHRONIC 02/03/2009  . COLONIC POLYPS 06/05/2008  . OSTEOARTHRITIS 06/04/2008  . DIARRHEA, CHRONIC 04/22/2008  . Low back pain 08/03/2007  . Chronic maxillary sinusitis 04/20/2007  . HYPOTHYROIDISM 02/26/2007  . HYPERLIPIDEMIA 02/26/2007  . ANXIETY 02/26/2007  . DEPRESSION 02/26/2007  . TARDIVE DYSKINESIA 02/26/2007  . GLAUCOMA 02/26/2007  . EUSTACHIAN TUBE DYSFUNCTION 02/26/2007  . HYPERTENSION 02/26/2007  . MITRAL VALVE PROLAPSE 02/26/2007  . ALLERGIC RHINITIS 02/26/2007  . GERD 02/26/2007  . HIATAL HERNIA 02/26/2007  . OSTEOPOROSIS 02/26/2007    NAUMANN-HOUEGNIFIO,Demarus Latterell PTA 03/25/2015, 5:21 PM  [facility] [address] [place], [place], [address] Phone: [phone]   Fax:  [phone]  Name: [name] MRN: [id number] Date of Birth: [birth year]

## 2015-04-01 ENCOUNTER — Ambulatory Visit: Payer: Medicare Other | Attending: Orthopaedic Surgery | Admitting: Physical Therapy

## 2015-04-01 ENCOUNTER — Encounter: Payer: Self-pay | Admitting: Physical Therapy

## 2015-04-01 DIAGNOSIS — R269 Unspecified abnormalities of gait and mobility: Secondary | ICD-10-CM | POA: Diagnosis not present

## 2015-04-01 DIAGNOSIS — M25639 Stiffness of unspecified wrist, not elsewhere classified: Secondary | ICD-10-CM

## 2015-04-01 DIAGNOSIS — R52 Pain, unspecified: Secondary | ICD-10-CM | POA: Diagnosis not present

## 2015-04-01 DIAGNOSIS — R29898 Other symptoms and signs involving the musculoskeletal system: Secondary | ICD-10-CM | POA: Diagnosis not present

## 2015-04-01 DIAGNOSIS — R279 Unspecified lack of coordination: Secondary | ICD-10-CM | POA: Diagnosis not present

## 2015-04-01 DIAGNOSIS — R6889 Other general symptoms and signs: Secondary | ICD-10-CM | POA: Insufficient documentation

## 2015-04-01 NOTE — Patient Instructions (Signed)
Copyright  VHI. All rights reserved.  AROM: Wrist Extension    With right palm down, bend wrist up. Repeat 10  times per set.  Do  3  sets per session.  Do _2- 3 sessions per day.  Copyright  VHI. All rights reserved.  AROM: Wrist Extension    With right palm down, bend wrist up. Repeat 10    times per set.  Do 2-3  sets per session. Do 2-3  sessions per day.  Copyright  VHI. All rights reserved.

## 2015-04-01 NOTE — Therapy (Addendum)
[facility] [address] [place], [place], [address] Phone: [phone]   Fax:  [phone]  Physical Therapy Treatment  Patient Details  Name: [name] MRN: [id number] Date of Birth: 1941-01-06 No Data Recorded  Encounter Date: 04/01/2015      PT End of Session - 04/01/15 1411    Visit Number 4   Number of Visits 17   Date for PT Re-Evaluation 04/29/15   Authorization Type Medicare Triad-G code every 10th visit   PT Start Time 1400   PT Stop Time 1444   PT Time Calculation (min) 44 min   Activity Tolerance Patient tolerated treatment well   Behavior During Therapy [facility] for tasks assessed/performed      Past Medical History  Diagnosis Date  . Osteoarthritis   . Hiatal hernia   . Chronic maxillary sinusitis     neti pot  . Depression     alone a lot  . Eustachian tube dysfunction   . Tardive dyskinesia     possibly reglan, vitamin E helps  . Mitral valve prolapse     antibiotics before dental procedures  . GERD (gastroesophageal reflux disease)   . Allergic rhinitis   . Glaucoma   . Hypothyroid   . Hypertension   . Heart murmur     hx. "MVP" -predental antibiotics  . Memory loss     short term memory loss  . Urgency of urination     some UTI in past  . Stroke [facility])     mini storkes left leg paraylsis. patient denies weakness 01/08/14.   . Cancer ([place]) 07/10/13    Pancreatic cancer with MRI scan 06-19-13    Past Surgical History  Procedure Laterality Date  . Knee surgery Left     x 5, total knee Left knee  . 1 baker cyst removed    . Dilation and curettage of uterus      x3  . Lumbar spine surgery      x2  . Abdominal hysterectomy      including ovaries  . Lumbar spine surgery      cyst  . Joint replacement      LTKA  . Blepharoplasty Bilateral     with cataract surgery  . Eye surgery Right     cataract  . Eus N/A 07/10/2013    Procedure: ESOPHAGEAL ENDOSCOPIC ULTRASOUND (EUS)  RADIAL;  Surgeon: Arta Silence, MD;  [facility];  Service: Endoscopy;  Laterality: N/A;  . Fine needle aspiration N/A 07/10/2013    Procedure: FINE NEEDLE ASPIRATION (FNA) LINEAR;  Surgeon: Arta Silence, MD;  [facility];  Service: Endoscopy;  Laterality: N/A;  possible fna  . Back surgery      fusion  . Breast surgery      Biopsy left 2 times  . Colonoscopy w/ polypectomy      2004 last colonoscopy, no polyps  . Laparoscopy N/A 08/07/2013    Procedure: LAPAROSCOPY DIAGNOSTIC;  Surgeon: [name], MD;  Location: [place];  Service: General;  Laterality: N/A;  . Whipple procedure N/A 08/07/2013    Procedure: WHIPPLE PROCEDURE;  Surgeon: [name], MD;  Location: [place];  Service: General;  Laterality: N/A;  . Esophagogastroduodenoscopy N/A 09/11/2013    Procedure: ESOPHAGOGASTRODUODENOSCOPY (EGD);  Surgeon: Cleotis Nipper, MD;  Location: [facility];  Service: Endoscopy;  Laterality: N/A;  Moderate sedation okay if MAC not available    There were no vitals filed for  this visit.  Visit Diagnosis:  Weakness of left upper extremity  Decreased range of motion of wrist  Lack of coordination      Subjective Assessment - 04/01/15 1406    Subjective No complains of pain in left wrist, but reports soreness in forearm, may be due to using the "egg" a lot for strengthening   Pertinent History Left distal WRIST fracture on 12/29/2014 conservativeCVA, pancreatic cancer, hiatal hernia, tardive dyskinesia, L knee replacement   Patient Stated Goals strengthen and increase ROM in L wrist   Currently in Pain? No/denies       Physical therapy treatment:  Left wrist extension and flexion  With 1# weight 3 x 10 able to tolerate with weight Left writs radial and ulnar deviation  3 x10 each side Pronation supination with 1 # weight 3 x 10   bil with focus on left forearm Biceps curls with 2 # wight 3x10 Pulleys  Flexion x 3 min Soft issue work to left forearm and wrist  extensors and gentle mob to distal radius/ulna and carpals.                         PT Education - 04/01/15 1443    Education provided Yes   Education Details wrist extension   Person(s) Educated Patient   Methods Demonstration;Handout   Comprehension Verbalized understanding;Returned demonstration          PT Short Term Goals - 04/01/15 1414    PT SHORT TERM GOAL #1   Title Pt will be independent with initial HEP   Time 4   Period Weeks   Status On-going   PT SHORT TERM GOAL #2   Title Pt will increase wrist ROM by 5 degrees in all directions to improve mobility   Time 4   Period Weeks   Status On-going           PT Long Term Goals - 04/01/15 1415    PT LONG TERM GOAL #1   Title Pt iindependent with advanced HEP   Time 8   Period Weeks   Status On-going   PT LONG TERM GOAL #2   Title Pt will improve FOTO to < = to 28% to demo improved functional abilities   Time 8   Period Weeks   Status On-going               Plan - 04/01/15 1411    Clinical Impression Statement Pt tolerates activities well in therapy. AROM in left wrist is limited into extenion, flexion, radial and ulna deviation and weakness. Pt will continue to benefit from skilled PT to address ROM, strength and endurance, left wrist.    Pt will benefit from skilled therapeutic intervention in order to improve on the following deficits Decreased range of motion;Increased edema;Decreased strength   Rehab Potential Good   Clinical Impairments Affecting Rehab Potential cognitive/memory impairment   PT Frequency 1x / week   PT Duration 8 weeks   PT Treatment/Interventions ADLs/Self Care Home Management;Cryotherapy;Ultrasound;Patient/family education;Therapeutic exercise;Therapeutic activities;Manual techniques;Passive range of motion   PT Next Visit Plan May start UBE, continue with ROM, gentle strength, soft tissue work   Oncologist with Plan of Care Patient         Problem List Patient Active Problem List   Diagnosis Date Noted  . Fatty liver 09/09/2014  . Paresthesia 07/07/2014  . Lumbago 07/07/2014  . Abnormality of gait 03/27/2014  . Memory loss 01/08/2014  .  Exocrine pancreatic insufficiency ([place]) 12/30/2013  . Other abnormal glucose 11/29/2013  . Carcinoma of head of pancreas ([place]) 07/01/2013  . 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 Paresthesia of foot 06/06/2012  . Syncope 09/26/2011  . RHINOSINUSITIS, CHRONIC 02/03/2009  . COLONIC POLYPS 06/05/2008  . OSTEOARTHRITIS 06/04/2008  . DIARRHEA, CHRONIC 04/22/2008  . Low back pain 08/03/2007  . Chronic maxillary sinusitis 04/20/2007  . HYPOTHYROIDISM 02/26/2007  . HYPERLIPIDEMIA 02/26/2007  . ANXIETY 02/26/2007  . DEPRESSION 02/26/2007  . TARDIVE DYSKINESIA 02/26/2007  . GLAUCOMA 02/26/2007  . EUSTACHIAN TUBE DYSFUNCTION 02/26/2007  . HYPERTENSION 02/26/2007  . MITRAL VALVE PROLAPSE 02/26/2007  . ALLERGIC RHINITIS 02/26/2007  . GERD 02/26/2007  . HIATAL HERNIA 02/26/2007  . OSTEOPOROSIS 02/26/2007    NAUMANN-HOUEGNIFIO,Jay Haskew PTA 04/01/2015, 2:46 PM  [facility] [address], [place], [place], [address] Phone: [phone]   Fax:  [phone]  Name: FALLYNN GRAVETT MRN: [id number] Date of Birth: Oct 14, 1940

## 2015-04-06 ENCOUNTER — Encounter: Payer: Self-pay | Admitting: Physical Therapy

## 2015-04-06 ENCOUNTER — Ambulatory Visit: Payer: Medicare Other | Admitting: Physical Therapy

## 2015-04-06 DIAGNOSIS — R52 Pain, unspecified: Secondary | ICD-10-CM

## 2015-04-06 DIAGNOSIS — R29898 Other symptoms and signs involving the musculoskeletal system: Secondary | ICD-10-CM | POA: Diagnosis not present

## 2015-04-06 DIAGNOSIS — R279 Unspecified lack of coordination: Secondary | ICD-10-CM

## 2015-04-06 DIAGNOSIS — R269 Unspecified abnormalities of gait and mobility: Secondary | ICD-10-CM

## 2015-04-06 DIAGNOSIS — M25639 Stiffness of unspecified wrist, not elsewhere classified: Secondary | ICD-10-CM

## 2015-04-06 DIAGNOSIS — R6889 Other general symptoms and signs: Secondary | ICD-10-CM | POA: Diagnosis not present

## 2015-04-06 NOTE — Therapy (Addendum)
[facility] [address], [place], [place], [address] Phone: [phone]   Fax:  [phone]  Physical Therapy Treatment  Patient Details  Name: [name] MRN: [id number] Date of Birth: 18-Mar-1941 No Data Recorded  Encounter Date: 04/06/2015      PT End of Session - 04/06/15 1427    Visit Number 5   Number of Visits 17   Date for PT Re-Evaluation 04/29/15   Authorization Type Medicare Triad-G code every 10th visit   PT Start Time 1400   PT Stop Time 1444   PT Time Calculation (min) 44 min   Activity Tolerance Patient tolerated treatment well   Behavior During Therapy [facility] for tasks assessed/performed      Past Medical History  Diagnosis Date  . Osteoarthritis   . Hiatal hernia   . Chronic maxillary sinusitis     neti pot  . Depression     alone a lot  . Eustachian tube dysfunction   . Tardive dyskinesia     possibly reglan, vitamin E helps  . Mitral valve prolapse     antibiotics before dental procedures  . GERD (gastroesophageal reflux disease)   . Allergic rhinitis   . Glaucoma   . Hypothyroid   . Hypertension   . Heart murmur     hx. "MVP" -predental antibiotics  . Memory loss     short term memory loss  . Urgency of urination     some UTI in past  . Stroke [facility])     mini storkes left leg paraylsis. patient denies weakness 01/08/14.   . Cancer ([place]) 07/10/13    Pancreatic cancer with MRI scan 06-19-13    Past Surgical History  Procedure Laterality Date  . Knee surgery Left     x 5, total knee Left knee  . 1 baker cyst removed    . Dilation and curettage of uterus      x3  . Lumbar spine surgery      x2  . Abdominal hysterectomy      including ovaries  . Lumbar spine surgery      cyst  . Joint replacement      LTKA  . Blepharoplasty Bilateral     with cataract surgery  . Eye surgery Right     cataract  . Eus N/A 07/10/2013    Procedure: ESOPHAGEAL ENDOSCOPIC ULTRASOUND (EUS)  RADIAL;  Surgeon: Arta Silence, MD;  [facility];  Service: Endoscopy;  Laterality: N/A;  . Fine needle aspiration N/A 07/10/2013    Procedure: FINE NEEDLE ASPIRATION (FNA) LINEAR;  Surgeon: Arta Silence, MD;  [facility];  Service: Endoscopy;  Laterality: N/A;  possible fna  . Back surgery      fusion  . Breast surgery      Biopsy left 2 times  . Colonoscopy w/ polypectomy      2004 last colonoscopy, no polyps  . Laparoscopy N/A 08/07/2013    Procedure: LAPAROSCOPY DIAGNOSTIC;  Surgeon: [name], MD;  Location: [place];  Service: General;  Laterality: N/A;  . Whipple procedure N/A 08/07/2013    Procedure: WHIPPLE PROCEDURE;  Surgeon: [name], MD;  Location: [place];  Service: General;  Laterality: N/A;  . Esophagogastroduodenoscopy N/A 09/11/2013    Procedure: ESOPHAGOGASTRODUODENOSCOPY (EGD);  Surgeon: Cleotis Nipper, MD;  Location: [facility];  Service: Endoscopy;  Laterality: N/A;  Moderate sedation okay if MAC not available    There were no vitals filed for  this visit.  Visit Diagnosis:  Decreased functional activity tolerance  Weakness of left upper extremity  Decreased range of motion of wrist  Lack of coordination  Abnormality of gait  Pain of multiple sites      Subjective Assessment - 04/06/15 1423    Subjective No complains of pain with rest, but with certain movement up to 7/10.    Pertinent History Left distal WRIST fracture on 12/29/2014 conservativeCVA, pancreatic cancer, hiatal hernia, tardive dyskinesia, L knee replacement   Patient Stated Goals strengthen and increase ROM in L wrist   Currently in Pain? No/denies   Multiple Pain Sites No                         [facility] Adult PT Treatment/Exercise - 04/06/15 0001    Exercises   Exercises Shoulder   Elbow Exercises   Forearm Supination AROM;Both;20 reps;10 reps;Other (comment)  with 2#weight   Wrist Flexion AROM   Wrist Extension AROM;20 reps;10 reps  with 1#    Shoulder Exercises: Pulleys   Flexion 3 minutes   Shoulder Exercises: ROM/Strengthening   UBE (Upper Arm Bike) Level 0 x 54min (2/2)   Wrist Exercises   Wrist Radial Deviation 20 reps;AROM   Wrist Ulnar Deviation AROM   Other wrist exercises small ball squeeze x 10  pink colored   Other wrist exercises seated elbow flexion with 1#    Manual Therapy   Manual Therapy Soft tissue mobilization   Manual therapy comments PROM wrist motion gentle per precautions per above.     Soft tissue mobilization along left forearm extensors and hand, pt with tightness and TP in extensors                  PT Short Term Goals - 04/06/15 1433    PT SHORT TERM GOAL #1   Title Pt will be independent with initial HEP   Period Weeks   Status On-going   PT SHORT TERM GOAL #2   Title Pt will increase wrist ROM by 5 degrees in all directions to improve mobility   Time 4   Period Weeks   Status On-going           PT Long Term Goals - 04/01/15 1415    PT LONG TERM GOAL #1   Title Pt iindependent with advanced HEP   Time 8   Period Weeks   Status On-going   PT LONG TERM GOAL #2   Title Pt will improve FOTO to < = to 28% to demo improved functional abilities   Time 8   Period Weeks   Status On-going               Plan - 04/06/15 1428    Clinical Impression Statement Pt tolerates activities well i n therapy, but weight is limited to 1# with wrist flexion and extension. Pt will continue to benefit from skilled PT to address ROM, strength and endurance, left wrist.   Pt will benefit from skilled therapeutic intervention in order to improve on the following deficits Decreased range of motion;Increased edema;Decreased strength   Rehab Potential Good   Clinical Impairments Affecting Rehab Potential cognitive/memory impairment   PT Frequency 1x / week   PT Duration 8 weeks   PT Treatment/Interventions ADLs/Self Care Home Management;Cryotherapy;Ultrasound;Patient/family  education;Therapeutic exercise;Therapeutic activities;Manual techniques;Passive range of motion   PT Next Visit Plan Continue with UBE, gentle strength with right wrist and soft tissue work  Consulted and Agree with Plan of Care Patient        Problem List Patient Active Problem List   Diagnosis Date Noted  . Fatty liver 09/09/2014  . Paresthesia 07/07/2014  . Lumbago 07/07/2014  . Abnormality of gait 03/27/2014  . Memory loss 01/08/2014  . Exocrine pancreatic insufficiency ([place]) 12/30/2013  . Other abnormal glucose 11/29/2013  . Carcinoma of head of pancreas ([place]) 07/01/2013  . 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 Paresthesia of foot 06/06/2012  . Syncope 09/26/2011  . RHINOSINUSITIS, CHRONIC 02/03/2009  . COLONIC POLYPS 06/05/2008  . OSTEOARTHRITIS 06/04/2008  . DIARRHEA, CHRONIC 04/22/2008  . Low back pain 08/03/2007  . Chronic maxillary sinusitis 04/20/2007  . HYPOTHYROIDISM 02/26/2007  . HYPERLIPIDEMIA 02/26/2007  . ANXIETY 02/26/2007  . DEPRESSION 02/26/2007  . TARDIVE DYSKINESIA 02/26/2007  . GLAUCOMA 02/26/2007  . EUSTACHIAN TUBE DYSFUNCTION 02/26/2007  . HYPERTENSION 02/26/2007  . MITRAL VALVE PROLAPSE 02/26/2007  . ALLERGIC RHINITIS 02/26/2007  . GERD 02/26/2007  . HIATAL HERNIA 02/26/2007  . OSTEOPOROSIS 02/26/2007    NAUMANN-HOUEGNIFIO,[name] PTA 04/06/2015, 2:46 PM  [facility] [address], [place], [place], [address] Phone: [phone]   Fax:  [phone]  Name: SHALEEN TALAMANTEZ MRN: [id number] Date of Birth: 02-11-1941

## 2015-04-09 ENCOUNTER — Other Ambulatory Visit: Payer: Self-pay | Admitting: Neurology

## 2015-04-09 NOTE — Telephone Encounter (Signed)
Last OV note says: Cymbalta discontinued by cardiology due to orthostasisCymbalta discontinued by cardiology due to orthostasis

## 2015-04-13 ENCOUNTER — Ambulatory Visit: Payer: Medicare Other | Admitting: Physical Therapy

## 2015-04-13 ENCOUNTER — Encounter: Payer: Self-pay | Admitting: Physical Therapy

## 2015-04-13 DIAGNOSIS — R29898 Other symptoms and signs involving the musculoskeletal system: Secondary | ICD-10-CM | POA: Diagnosis not present

## 2015-04-13 DIAGNOSIS — R269 Unspecified abnormalities of gait and mobility: Secondary | ICD-10-CM | POA: Diagnosis not present

## 2015-04-13 DIAGNOSIS — R279 Unspecified lack of coordination: Secondary | ICD-10-CM

## 2015-04-13 DIAGNOSIS — R52 Pain, unspecified: Secondary | ICD-10-CM | POA: Diagnosis not present

## 2015-04-13 DIAGNOSIS — R6889 Other general symptoms and signs: Secondary | ICD-10-CM | POA: Diagnosis not present

## 2015-04-13 DIAGNOSIS — M25639 Stiffness of unspecified wrist, not elsewhere classified: Secondary | ICD-10-CM

## 2015-04-13 NOTE — Therapy (Signed)
[facility] [address], [place], [place], [address] Phone: [phone]   Fax:  [phone]  Physical Therapy Treatment  Patient Details  Name: [name] MRN: [id number] Date of Birth: 1940-11-03 No Data Recorded  Encounter Date: 04/13/2015      PT End of Session - 04/13/15 1428    Visit Number 6   Number of Visits 17   Date for PT Re-Evaluation 04/29/15   Authorization Type Medicare Triad-G code every 10th visit   PT Start Time 1401   PT Stop Time 1444   PT Time Calculation (min) 43 min   Activity Tolerance Patient tolerated treatment well   Behavior During Therapy [facility] for tasks assessed/performed      Past Medical History  Diagnosis Date  . Osteoarthritis   . Hiatal hernia   . Chronic maxillary sinusitis     neti pot  . Depression     alone a lot  . Eustachian tube dysfunction   . Tardive dyskinesia     possibly reglan, vitamin E helps  . Mitral valve prolapse     antibiotics before dental procedures  . GERD (gastroesophageal reflux disease)   . Allergic rhinitis   . Glaucoma   . Hypothyroid   . Hypertension   . Heart murmur     hx. "MVP" -predental antibiotics  . Memory loss     short term memory loss  . Urgency of urination     some UTI in past  . Stroke [facility])     mini storkes left leg paraylsis. patient denies weakness 01/08/14.   . Cancer ([place]) 07/10/13    Pancreatic cancer with MRI scan 06-19-13    Past Surgical History  Procedure Laterality Date  . Knee surgery Left     x 5, total knee Left knee  . 1 baker cyst removed    . Dilation and curettage of uterus      x3  . Lumbar spine surgery      x2  . Abdominal hysterectomy      including ovaries  . Lumbar spine surgery      cyst  . Joint replacement      LTKA  . Blepharoplasty Bilateral     with cataract surgery  . Eye surgery Right     cataract  . Eus N/A 07/10/2013    Procedure: ESOPHAGEAL ENDOSCOPIC ULTRASOUND (EUS)  RADIAL;  Surgeon: Arta Silence, MD;  Location: [facility];  Service: Endoscopy;  Laterality: N/A;  . Fine needle aspiration N/A 07/10/2013    Procedure: FINE NEEDLE ASPIRATION (FNA) LINEAR;  Surgeon: Arta Silence, MD;  Location: [facility];  Service: Endoscopy;  Laterality: N/A;  possible fna  . Back surgery      fusion  . Breast surgery      Biopsy left 2 times  . Colonoscopy w/ polypectomy      2004 last colonoscopy, no polyps  . Laparoscopy N/A 08/07/2013    Procedure: LAPAROSCOPY DIAGNOSTIC;  Surgeon: [name], MD;  Location: [place];  Service: General;  Laterality: N/A;  . Whipple procedure N/A 08/07/2013    Procedure: WHIPPLE PROCEDURE;  Surgeon: [name], MD;  Location: [place];  Service: General;  Laterality: N/A;  . Esophagogastroduodenoscopy N/A 09/11/2013    Procedure: ESOPHAGOGASTRODUODENOSCOPY (EGD);  Surgeon: Cleotis Nipper, MD;  Location: [facility];  Service: Endoscopy;  Laterality: N/A;  Moderate sedation okay if MAC not available    There were no vitals filed for  this visit.  Visit Diagnosis:  Weakness of left upper extremity  Decreased range of motion of wrist  Lack of coordination      Subjective Assessment - 04/13/15 1410    Subjective Pt used her "egg" at home to much and now feels soreness in left wrist.However, she reports now she is able to fix her hair.    Currently in Pain? No/denies                         [facility] Adult PT Treatment/Exercise - 04/13/15 0001    Exercises   Exercises Shoulder   Elbow Exercises   Forearm Supination AROM;Both;20 reps;10 reps;Other (comment)   Forearm Pronation Limitations --  same per supination with 2#   Wrist Flexion AROM   Wrist Extension AROM;20 reps;10 reps  1# weight   Shoulder Exercises: ROM/Strengthening   UBE (Upper Arm Bike) Level 1 x 27min (3/3)   Wrist Exercises   Wrist Radial Deviation 20 reps;AROM   Wrist Ulnar Deviation 20 reps;AROM   Other wrist exercises small ball  squeeze x 10  pink colored   Other wrist exercises seated elbow flexion with 1#    Manual Therapy   Manual Therapy Soft tissue mobilization   Manual therapy comments PROM wrist motion gentle per precautions per above.     Soft tissue mobilization along left forearm extensors and hand, pt with tightness and TP in extensors                  PT Short Term Goals - 04/13/15 1446    PT SHORT TERM GOAL #1   Title Pt will be independent with initial HEP   Time 4   Period Weeks   Status On-going   PT SHORT TERM GOAL #2   Title Pt will increase wrist ROM by 5 degrees in all directions to improve mobility   Time 4   Period Weeks   Status On-going           PT Long Term Goals - 04/13/15 1446    PT LONG TERM GOAL #1   Title Pt iindependent with advanced HEP   Time 8   Period Weeks   Status On-going   PT LONG TERM GOAL #2   Title Pt will improve FOTO to < = to 28% to demo improved functional abilities   Time 8   Period Weeks   Status On-going               Plan - 04/13/15 1444    Clinical Impression Statement Pt with imporved tolerance with activities in PT, but still limited some pain with wrist extension and flexion - as popping tenden -. Pt will continue to benefit from skillet PT to address ROM, strength and endurance, left wrist   Pt will benefit from skilled therapeutic intervention in order to improve on the following deficits Decreased range of motion;Increased edema;Decreased strength   Rehab Potential Good   Clinical Impairments Affecting Rehab Potential cognitive/memory impairment   PT Frequency 1x / week   PT Duration 8 weeks   PT Treatment/Interventions ADLs/Self Care Home Management;Cryotherapy;Ultrasound;Patient/family education;Therapeutic exercise;Therapeutic activities;Manual techniques;Passive range of motion   PT Next Visit Plan Continue with UBE, gentle strength with right wrist and soft tissue work   [employer] and Agree with Plan of Care  Patient        Problem List Patient Active Problem List   Diagnosis Date Noted  . Fatty liver 09/09/2014  .  Paresthesia 07/07/2014  . Lumbago 07/07/2014  . Abnormality of gait 03/27/2014  . Memory loss 01/08/2014  . Exocrine pancreatic insufficiency ([place]) 12/30/2013  . Other abnormal glucose 11/29/2013  . Carcinoma of head of pancreas ([place]) 07/01/2013  . 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 Paresthesia of foot 06/06/2012  . Syncope 09/26/2011  . RHINOSINUSITIS, CHRONIC 02/03/2009  . COLONIC POLYPS 06/05/2008  . OSTEOARTHRITIS 06/04/2008  . DIARRHEA, CHRONIC 04/22/2008  . Low back pain 08/03/2007  . Chronic maxillary sinusitis 04/20/2007  . HYPOTHYROIDISM 02/26/2007  . HYPERLIPIDEMIA 02/26/2007  . ANXIETY 02/26/2007  . DEPRESSION 02/26/2007  . TARDIVE DYSKINESIA 02/26/2007  . GLAUCOMA 02/26/2007  . EUSTACHIAN TUBE DYSFUNCTION 02/26/2007  . HYPERTENSION 02/26/2007  . MITRAL VALVE PROLAPSE 02/26/2007  . ALLERGIC RHINITIS 02/26/2007  . GERD 02/26/2007  . HIATAL HERNIA 02/26/2007  . OSTEOPOROSIS 02/26/2007    NAUMANN-HOUEGNIFIO,Katiejo Gilroy PTA 04/13/2015, 5:31 PM  [facility] [address], [place], [place], [address] Phone: [phone]   Fax:  [phone]  Name: [name] MRN: [id number] Date of Birth: 1940/06/26

## 2015-04-17 DIAGNOSIS — K644 Residual hemorrhoidal skin tags: Secondary | ICD-10-CM | POA: Diagnosis not present

## 2015-04-20 ENCOUNTER — Encounter: Payer: Self-pay | Admitting: Physical Therapy

## 2015-04-20 ENCOUNTER — Ambulatory Visit: Payer: Medicare Other | Admitting: Physical Therapy

## 2015-04-20 DIAGNOSIS — R29898 Other symptoms and signs involving the musculoskeletal system: Secondary | ICD-10-CM

## 2015-04-20 DIAGNOSIS — R6889 Other general symptoms and signs: Secondary | ICD-10-CM | POA: Diagnosis not present

## 2015-04-20 DIAGNOSIS — R279 Unspecified lack of coordination: Secondary | ICD-10-CM | POA: Diagnosis not present

## 2015-04-20 DIAGNOSIS — R269 Unspecified abnormalities of gait and mobility: Secondary | ICD-10-CM | POA: Diagnosis not present

## 2015-04-20 DIAGNOSIS — R52 Pain, unspecified: Secondary | ICD-10-CM | POA: Diagnosis not present

## 2015-04-20 DIAGNOSIS — M25639 Stiffness of unspecified wrist, not elsewhere classified: Secondary | ICD-10-CM

## 2015-04-20 NOTE — Therapy (Signed)
[facility] [address], [place], [place], [address] Phone: [phone]   Fax:  [phone]  Physical Therapy Treatment  Patient Details  Name: [name] MRN: [id number] Date of Birth: Sep 18, 1940 No Data Recorded  Encounter Date: 04/20/2015      PT End of Session - 04/20/15 1436    Visit Number 7   Number of Visits 17   Date for PT Re-Evaluation 04/29/15   Authorization Type Medicare Triad-G code every 10th visit   PT Start Time 1403   PT Stop Time 1445   PT Time Calculation (min) 42 min   Activity Tolerance Patient tolerated treatment well   Behavior During Therapy [facility] for tasks assessed/performed      Past Medical History  Diagnosis Date  . Osteoarthritis   . Hiatal hernia   . Chronic maxillary sinusitis     neti pot  . Depression     alone a lot  . Eustachian tube dysfunction   . Tardive dyskinesia     possibly reglan, vitamin E helps  . Mitral valve prolapse     antibiotics before dental procedures  . GERD (gastroesophageal reflux disease)   . Allergic rhinitis   . Glaucoma   . Hypothyroid   . Hypertension   . Heart murmur     hx. "MVP" -predental antibiotics  . Memory loss     short term memory loss  . Urgency of urination     some UTI in past  . Stroke [facility])     mini storkes left leg paraylsis. patient denies weakness 01/08/14.   . Cancer ([place]) 07/10/13    Pancreatic cancer with MRI scan 06-19-13    Past Surgical History  Procedure Laterality Date  . Knee surgery Left     x 5, total knee Left knee  . 1 baker cyst removed    . Dilation and curettage of uterus      x3  . Lumbar spine surgery      x2  . Abdominal hysterectomy      including ovaries  . Lumbar spine surgery      cyst  . Joint replacement      LTKA  . Blepharoplasty Bilateral     with cataract surgery  . Eye surgery Right     cataract  . Eus N/A 07/10/2013    Procedure: ESOPHAGEAL ENDOSCOPIC ULTRASOUND (EUS)  RADIAL;  Surgeon: Arta Silence, MD;  [facility]: [facility];  Service: Endoscopy;  Laterality: N/A;  . Fine needle aspiration N/A 07/10/2013    Procedure: FINE NEEDLE ASPIRATION (FNA) LINEAR;  Surgeon: Arta Silence, MD;  [facility]: [facility];  Service: Endoscopy;  Laterality: N/A;  possible fna  . Back surgery      fusion  . Breast surgery      Biopsy left 2 times  . Colonoscopy w/ polypectomy      2004 last colonoscopy, no polyps  . Laparoscopy N/A 08/07/2013    Procedure: LAPAROSCOPY DIAGNOSTIC;  Surgeon: [name], MD;  Location: [place];  Service: General;  Laterality: N/A;  . Whipple procedure N/A 08/07/2013    Procedure: WHIPPLE PROCEDURE;  Surgeon: [name], MD;  Location: [place];  Service: General;  Laterality: N/A;  . Esophagogastroduodenoscopy N/A 09/11/2013    Procedure: ESOPHAGOGASTRODUODENOSCOPY (EGD);  Surgeon: Cleotis Nipper, MD;  Location: [facility];  Service: Endoscopy;  Laterality: N/A;  Moderate sedation okay if MAC not available    There were no vitals filed for  this visit.  Visit Diagnosis:  Weakness of left upper extremity  Decreased range of motion of wrist  Lack of coordination      Subjective Assessment - 04/20/15 1405    Subjective Pt is able to tolerate housework with her wrist. No complain of pain, but still soreness in left wrist. Now she is able to fix her hair.   Currently in Pain? No/denies            [facility] PT Assessment - 04/20/15 0001    Assessment   Medical Diagnosis L distal radius fracture   Onset Date/Surgical Date 12/29/14   Precautions   Precautions Fall   Required Braces or Orthoses --  not more using the splint   Restrictions   Other Position/Activity Restrictions Gentle wrist ROM only!   Balance Screen   Has the patient fallen in the past 6 months Yes   How many times? 2   Has the patient had a decrease in activity level because of a fear of falling?  No   Is the patient reluctant to leave their home because of a  fear of falling?  No   Home Environment   Living Environment Private residence   Living Arrangements Alone   Available Help at Discharge Family   Type of [place] to enter   Entrance Stairs-Number of Steps 2   Entrance Stairs-Rails None   Home Layout One level   [place] - single point  uses it if needed    Observation/Other Assessments   Observations no swelling   Focus on Therapeutic Outcomes (FOTO)   limitations   Sensation   Light Touch Appears Intact   AROM   AROM Assessment Site Wrist   Right/Left Wrist Left   Left Wrist Extension 60 Degrees   Left Wrist Flexion 52 Degrees   Left Wrist Radial Deviation 35 Degrees   Left Wrist Ulnar Deviation 30 Degrees   Strength   Overall Strength --  Handdynamometer left 30#, right 38#   Palpation   Palpation comment slighly sensitive to deep pressure on left radius                     [facility] Adult PT Treatment/Exercise - 04/20/15 0001    Exercises   Exercises Shoulder   Elbow Exercises   Forearm Supination AROM;Both;20 reps;10 reps;Other (comment)  with 2#   Forearm Pronation Limitations --  same per supination with 2#   Wrist Flexion AROM   Wrist Extension AROM;20 reps;10 reps   Shoulder Exercises: ROM/Strengthening   UBE (Upper Arm Bike) Level 1 x 55mn (3/3)   Wrist Exercises   Wrist Radial Deviation 20 reps;AROM  2 sets with 2 #   Wrist Ulnar Deviation 20 reps  with 2#   Other wrist exercises small ball squeeze x 10   Other wrist exercises seated elbow flexion with 4#    Manual Therapy   Manual Therapy Soft tissue mobilization   Manual therapy comments PROM wrist motion gentle per precautions per above.     Soft tissue mobilization along left forearm extensors and hand, pt with tightness and TP in extensors                  PT Short Term Goals - 04/20/15 1443    PT SHORT TERM GOAL #1   Title Pt will be independent with initial HEP   Time 4   Period Weeks    Status  Achieved   PT SHORT TERM GOAL #2   Title Pt will increase wrist ROM by 5 degrees in all directions to improve mobility   Time 4   Period Weeks   Status Achieved           PT Long Term Goals - 04/20/15 1444    PT LONG TERM GOAL #1   Title Pt iindependent with advanced HEP   Time 8   Period Weeks   Status Achieved   PT LONG TERM GOAL #2   Title Pt will improve FOTO to < = to 28% to demo improved functional abilities   Time 8   Period Weeks   Status Partially Met               Plan - 04/20/15 1436    Clinical Impression Statement Pt with improved AROM in degrees in left wrist: extension 69, flexion 52, radial deviation 35,  ulnar deviation 30. Handdynamometer left 30#. Pt wishes to be D/C    Pt will benefit from skilled therapeutic intervention in order to improve on the following deficits Decreased range of motion;Increased edema;Decreased strength   Rehab Potential Good   PT Frequency 1x / week   PT Duration 8 weeks   PT Treatment/Interventions ADLs/Self Care Home Management;Cryotherapy;Ultrasound;Patient/family education;Therapeutic exercise;Therapeutic activities;Manual techniques;Passive range of motion   PT Next Visit Plan D/C   Consulted and Agree with Plan of Care Patient        Problem List Patient Active Problem List   Diagnosis Date Noted  . Fatty liver 09/09/2014  . Paresthesia 07/07/2014  . Lumbago 07/07/2014  . Abnormality of gait 03/27/2014  . Memory loss 01/08/2014  . Exocrine pancreatic insufficiency ([place]) 12/30/2013  . Other abnormal glucose 11/29/2013  . Carcinoma of head of pancreas ([place]) 07/01/2013  . 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 Paresthesia of foot 06/06/2012  . Syncope 09/26/2011  . RHINOSINUSITIS, CHRONIC 02/03/2009  . COLONIC POLYPS 06/05/2008  . OSTEOARTHRITIS 06/04/2008  . DIARRHEA, CHRONIC 04/22/2008  . Low back pain 08/03/2007  . Chronic maxillary sinusitis 04/20/2007  .  HYPOTHYROIDISM 02/26/2007  . HYPERLIPIDEMIA 02/26/2007  . ANXIETY 02/26/2007  . DEPRESSION 02/26/2007  . TARDIVE DYSKINESIA 02/26/2007  . GLAUCOMA 02/26/2007  . EUSTACHIAN TUBE DYSFUNCTION 02/26/2007  . HYPERTENSION 02/26/2007  . MITRAL VALVE PROLAPSE 02/26/2007  . ALLERGIC RHINITIS 02/26/2007  . GERD 02/26/2007  . HIATAL HERNIA 02/26/2007  . OSTEOPOROSIS 02/26/2007    Mariavictoria Nottingham Naumann-Houegnifio, PTA 04/20/2015 2:57 PM PHYSICAL THERAPY DISCHARGE SUMMARY  Visits from Start of Care: 7  Current functional level related to goals / functional outcomes: See above for current status.  Pt requested D/C to HEP.     Remaining deficits: Limited wrist AROM and wrist/grip strength.  Pt has HEP in place to address remaining deficits.     Education / Equipment: HEP Plan: Patient agrees to discharge.  Patient goals were partially met. Patient is being discharged due to being pleased with the current functional level.  ?????   Sigurd Sos, PT 04/20/2015 3:12 PM [facility] [address], [place], [place], [address] Phone: [phone]   Fax:  [phone]  Name: [name] MRN: [id number] Date of Birth: 12/22/1940

## 2015-04-27 ENCOUNTER — Ambulatory Visit: Payer: Medicare Other

## 2015-04-28 ENCOUNTER — Other Ambulatory Visit: Payer: Self-pay | Admitting: General Surgery

## 2015-04-28 DIAGNOSIS — K432 Incisional hernia without obstruction or gangrene: Secondary | ICD-10-CM | POA: Diagnosis not present

## 2015-05-07 ENCOUNTER — Encounter: Payer: Self-pay | Admitting: Internal Medicine

## 2015-05-11 ENCOUNTER — Encounter: Payer: Self-pay | Admitting: Nurse Practitioner

## 2015-05-11 ENCOUNTER — Ambulatory Visit (INDEPENDENT_AMBULATORY_CARE_PROVIDER_SITE_OTHER): Payer: Medicare Other | Admitting: Nurse Practitioner

## 2015-05-11 VITALS — BP 167/80 | HR 69 | Ht 61.0 in | Wt 152.0 lb

## 2015-05-11 DIAGNOSIS — G609 Hereditary and idiopathic neuropathy, unspecified: Secondary | ICD-10-CM | POA: Diagnosis not present

## 2015-05-11 DIAGNOSIS — R202 Paresthesia of skin: Secondary | ICD-10-CM | POA: Diagnosis not present

## 2015-05-11 DIAGNOSIS — R55 Syncope and collapse: Secondary | ICD-10-CM | POA: Diagnosis not present

## 2015-05-11 DIAGNOSIS — R269 Unspecified abnormalities of gait and mobility: Secondary | ICD-10-CM | POA: Diagnosis not present

## 2015-05-11 MED ORDER — ROPINIROLE HCL 0.5 MG PO TABS: 0.5000 mg | ORAL_TABLET | Freq: Every day | ORAL | Status: DC

## 2015-05-11 MED ORDER — GABAPENTIN 400 MG PO CAPS: ORAL_CAPSULE | ORAL | Status: DC

## 2015-05-11 MED ORDER — OXCARBAZEPINE 150 MG PO TABS: ORAL_TABLET | ORAL | Status: DC

## 2015-05-11 NOTE — Progress Notes (Signed)
[facility]  PATIENT: [name] DOB: 1941-04-20   REASON FOR VISIT: Follow-up for peripheral neuropathy, paresthesias of both feet, syncopal episode, abnormality of gait  HISTORY FROM: Patient    HISTORY OF PRESENT ILLNESS:[name], 74 year old female returns for follow-up she was last seen in the office 03/05/2015. She has a long history of small fiber neuropathy and continues to have pain in her feet. When last seen her symptoms were fairly well controlled on a combination of gabapentin, Requip, Trileptal,  and compounding cream. She fell and broke her left wrist questionable syncopal episode. She was taken off the Topamax by her psychiatrist and recently Cymbalta by her cardiologist due to orthostatic hypotension.  Stress test was normal. Her feet continue to burn and sting but no more or no less. She has been evaluated by Dr. Ace Gins for neuro stimulation. but she is reluctant to go through this procedure. She needs a hernia surgery .Her rehab  for the upper extremities completed about a month ago.  She returns for reevaluation   UPDATE 01/01/2015 [name], 74 year old female returns for follow-up. She has a long history of small fiber neuropathy and continues to complain burning in the top of her feet and muscle spasms in the calf. She has been to pain management since last seen by Dr. Krista Blue on April 6th 2016. Neuro stimulation was discussed with the patient however she is reluctant to do this. She remains on Requip Trileptal, Topamax and Cymbalta. She is also on a compounding cream. She denies any falls. She does not use an assistive device, she has problems with her housework and cooking because she is not able to stand on her feet for long periods of time to do these things. She returns for reevaluation    HISTORY:[name], 74 year old female returns for followup. Her Primary Care physician is Dr. Yong Channel. She continues to I have seen her over the past few  years for bilateral lower extremity pain, bilateral feet paresthesia She had past medical history of hypertension, hyperlipidemia, depression, is on polypharmacy treatment, including Prozac 20 mg every day, Ativan 3 times a day, Topamax 200 mg twice a day,  SRichardhe presented with more than 10 years history of bilateral feet paresthesia, getting worse since 2010, she described bilateral feet numbness tingling, going up to her legs, her feet felt like it was broken sometimes, her feet burns, ice pack helps some, but sometimes she put on socks, wrapping tightly around her socks, which seems to help her symptoms some, her feet pain getting worse after bearing weight.  She also has a history of chronic low back pain, had a history of low back decompression surgery by Dr. Lorin Mercy in 2004, she denies shooting pain to her neck, no bowel bladder incontinence, mild gait difficulty due to feet pain, no significant weakness  Most recent laboratory evaluation showed normal vitamin B12, TSH, CMP, elevated WBC 15.7 CBC, RPR, ANA, vitamin B12, TSH, mild elevated C. reactive protein of 11.2,  She has tried compounding cream, which has made her skin peel, she is taking gabapentin 300 mg 3 times a day, which helped her 60%, also make her tired, the most bothersome symptoms at nighttime, she complains of bilateral plantar feet burning pain, she has to wrap her toes together, soak it in hot water, which only helped her temporarily.  Over the past few years, she also developed gradual worsening gait difficulty, she presented with right thoracic area pain, eventually was diagnosed with pancreatic cancer, had  a Whipple procedure in March 2015, no chemo, radiation therapy  EMG nerve conduction study in June 2014, showed no large fiber peripheral neuropathy, no evidence of lumbar radiculopathy  We have reviewed MRI lumbar in November 2015, 1. At L3-4: disc bulging and facet hypertrophy with moderate biforaminal stenosis   2. At L2-3: disc bulging and facet hypertrophy with mild biforaminal stenosis  3. Posterior decompression and interbody fusion from L4-L5-S1 with metal screws and adjoining hardware. She could not tolerate nortriptyline, complains of nausea,  UPDATE Feb 8th 2016: She continues to complains of needle sticking pain at the top of her feet, feet muscle, and calf muscle spasm,  Skin biopsy was abnormal significantly decreased nerve fiber density at the right thigh, right foot, consistent with small fiber neuropathy, epidermal nerve fiber density was normal in the right calf, right foot showed normal sweat gland nerve fiber density.     REVIEW OF SYSTEMS: Full 14 system review of systems performed and notable only for those listed, all others are neg:  Constitutional: neg  Cardiovascular: neg Ear/Nose/Throat: neg  Skin: neg Eyes: neg Respiratory: neg Gastroitestinal: neg  Hematology/Lymphatic: neg  Endocrine: neg Musculoskeletal:neg Allergy/Immunology: neg Neurological: neg Psychiatric: neg Sleep : neg   ALLERGIES: Allergies  Allergen Reactions  . Other Other (See Comments)    According to patient Trilafor and Reglan cause Tardive Dyskinesia  . Metoclopramide Hcl Other (See Comments)    Shaking; caused tardive dyskinesia  . Niacin Other (See Comments)    shaking  . Trovan [Alatrofloxacin Mesylate] Other (See Comments)    Caused shaking and nervousness  . Nortriptyline Other (See Comments)    Dizziness   . Benzocaine-Resorcinol Rash  . Celecoxib Other (See Comments)    unknown  . Glucosamine Other (See Comments)    unknown  . Phenazopyridine Hcl Other (See Comments)    unknown  . Sulfa Antibiotics Nausea And Vomiting and Rash  . Sulfonamide Derivatives Nausea And Vomiting and Rash    HOME MEDICATIONS: Outpatient Prescriptions Prior to Visit  Medication Sig Dispense Refill  . acetaminophen (TYLENOL) 325 MG tablet Take 650 mg by mouth every 6 (six) hours as needed  for mild pain, moderate pain or fever.    Marland Kitchen aluminum & magnesium hydroxide-simethicone (MYLANTA) 500-450-40 MG/5ML suspension Take 5 mLs by mouth every 6 (six) hours as needed for indigestion.    Marland Kitchen aspirin 81 MG tablet Take 81 mg by mouth at bedtime.     Marland Kitchen atenolol (TENORMIN) 25 MG tablet Take 1 tablet by mouth  every morning 90 tablet 3  . carboxymethylcellulose (REFRESH TEARS) 0.5 % SOLN Place 1 drop into both eyes 2 (two) times daily.     . cholecalciferol (VITAMIN D) 1000 UNITS tablet Take 1,000 Units by mouth 2 (two) times daily.     . cyanocobalamin 2000 MCG tablet Take 2,000 mcg by mouth daily at 12 noon.     . docusate sodium (COLACE) 100 MG capsule Take 200 mg by mouth at bedtime.    . feeding supplement (BOOST HIGH PROTEIN) LIQD Take 1 Container by mouth daily.    Marland Kitchen FLUoxetine (PROZAC) 20 MG capsule Take 1 capsule (20 mg total) by mouth daily. 90 capsule 2  . gabapentin (NEURONTIN) 400 MG capsule Take 2 capsules (800 mg total) by mouth 4 (four) times daily. 240 capsule 6  . levothyroxine (SYNTHROID, LEVOTHROID) 175 MCG tablet Take 1 tablet by mouth  daily before breakfast 90 tablet 2  . lipase/protease/amylase (CREON) 12000 UNITS CPEP capsule  Take 2 capsules (24,000 Units total) by mouth 3 (three) times daily before meals. 270 capsule 5  . loratadine (CLARITIN) 10 MG tablet Take 10 mg by mouth daily.    Marland Kitchen lovastatin (MEVACOR) 40 MG tablet Take 1 tablet by mouth at  bedtime 90 tablet 2  . meclizine (ANTIVERT) 25 MG tablet Take 25 mg by mouth 3 (three) times daily as needed for dizziness.    . Multiple Vitamin (MULTIVITAMIN WITH MINERALS) TABS tablet Take 1 tablet by mouth daily.    . NONFORMULARY OR COMPOUNDED ITEM Doxep 3% Amanta 3% DXM 2% Lidocaine 2% Clonodine 0.2% and Tramadol 2%    . omeprazole (PRILOSEC OTC) 20 MG tablet Take 20 mg by mouth 2 (two) times daily.     . OXcarbazepine (TRILEPTAL) 150 MG tablet TAKE 1/2 TABLET TWICE A DAY FOR 1 WEEK THEN 1 TABLET TWICE A DAY 60 tablet 6   . polycarbophil (FIBERCON) 625 MG tablet Take 625 mg by mouth daily.     . promethazine (PHENERGAN) 25 MG tablet Take 25 mg by mouth every 6 (six) hours as needed for nausea or vomiting.    Marland Kitchen rOPINIRole (REQUIP) 0.5 MG tablet One po qhs xone week, then 2 tabs po qhs (Patient taking differently: Take 0.5 mg by mouth at bedtime. ) 60 tablet 11  . Simethicone 180 MG CAPS Take 180 mg by mouth 2 (two) times daily.     Marland Kitchen UNABLE TO FIND Apply 1 application topically 4 (four) times daily. Doxep/Amanta/DXM/Lidocaine 25 mg for her feet    . vitamin E 400 UNIT capsule Take 800 Units by mouth 2 (two) times daily. For tartive dyskinesia     No facility-administered medications prior to visit.    PAST MEDICAL HISTORY: Past Medical History  Diagnosis Date  . Osteoarthritis   . Hiatal hernia   . Chronic maxillary sinusitis     neti pot  . Depression     alone a lot  . Eustachian tube dysfunction   . Tardive dyskinesia     possibly reglan, vitamin E helps  . Mitral valve prolapse     antibiotics before dental procedures  . GERD (gastroesophageal reflux disease)   . Allergic rhinitis   . Glaucoma   . Hypothyroid   . Hypertension   . Heart murmur     hx. "MVP" -predental antibiotics  . Memory loss     short term memory loss  . Urgency of urination     some UTI in past  . Stroke [facility])     mini storkes left leg paraylsis. patient denies weakness 01/08/14.   . Cancer ([place]) 07/10/13    Pancreatic cancer with MRI scan 06-19-13    PAST SURGICAL HISTORY: Past Surgical History  Procedure Laterality Date  . Knee surgery Left     x 5, total knee Left knee  . 1 baker cyst removed    . Dilation and curettage of uterus      x3  . Lumbar spine surgery      x2  . Abdominal hysterectomy      including ovaries  . Lumbar spine surgery      cyst  . Joint replacement      LTKA  . Blepharoplasty Bilateral     with cataract surgery  . Eye surgery Right     cataract  . Eus N/A 07/10/2013     Procedure: ESOPHAGEAL ENDOSCOPIC ULTRASOUND (EUS) RADIAL;  Surgeon: Arta Silence, MD;  Location: [facility];  Service: Endoscopy;  Laterality: N/A;  . Fine needle aspiration N/A 07/10/2013    Procedure: FINE NEEDLE ASPIRATION (FNA) LINEAR;  Surgeon: Arta Silence, MD;  Location: [facility];  Service: Endoscopy;  Laterality: N/A;  possible fna  . Back surgery      fusion  . Breast surgery      Biopsy left 2 times  . Colonoscopy w/ polypectomy      2004 last colonoscopy, no polyps  . Laparoscopy N/A 08/07/2013    Procedure: LAPAROSCOPY DIAGNOSTIC;  Surgeon: [name], MD;  Location: [place];  Service: General;  Laterality: N/A;  . Whipple procedure N/A 08/07/2013    Procedure: WHIPPLE PROCEDURE;  Surgeon: [name], MD;  Location: [place];  Service: General;  Laterality: N/A;  . Esophagogastroduodenoscopy N/A 09/11/2013    Procedure: ESOPHAGOGASTRODUODENOSCOPY (EGD);  Surgeon: Cleotis Nipper, MD;  Location: [facility];  Service: Endoscopy;  Laterality: N/A;  Moderate sedation okay if MAC not available    FAMILY HISTORY: Family History  Problem Relation Age of Onset  . Cancer Mother     Breast Cancer with Metastatic disease  . Alzheimer's disease Father   . Other Brother 17    GSW    SOCIAL HISTORY: Social History   Social History  . Marital Status: Widowed    Spouse Name: N/A  . Number of Children: 2  . Years of Education: 8   Occupational History  .    Marland Kitchen Retired    Social History Main Topics  . Smoking status: Never Smoker   . Smokeless tobacco: Never Used  . Alcohol Use: No  . Drug Use: No  . Sexual Activity: Not Currently   Other Topics Concern  . 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 Generalized: Well developed, in no acute distress  Head: normocephalic and atraumatic,. Oropharynx benign  Neck: Supple, no carotid bruits ,  Neurological examination  Mentation: Alert oriented to time, place, history taking. Follows all commands speech and language fluent.  Cranial nerve II-XII: .Pupils were equal round reactive to light extraocular movements were full, visual field were full on confrontational test. Facial sensation and strength were normal. hearing was intact to finger rubbing bilaterally. Uvula tongue midline. head turning and shoulder shrug were normal and symmetric.Tongue protrusion into cheek strength was normal.  Motor: normal bulk and tone, full strength in the BUE, BLE, No focal weakness, tenderness of bilateral feet upon deep palpation  Sensory: Length dependent decreased fine touch, pinprick to mid shin level bilaterally, decreased vibratory to toes, preserved proprioception  Coordination: finger-nose-finger, heel-to-shin bilaterally, no dysmetria  Reflexes: Brachioradialis 2/2, biceps 2/2, triceps 2/2, patellar 2/2, Achilles trace, plantar responses were flexor bilaterally.  Gait and Station: Rising up from seated position without assistance, cautious, narrow based, was able to stand on tiptoe, and heels, tandem gait is unsteady, ambulating with single-point cane. DIAGNOSTIC DATA (LABS, IMAGING, TESTING) - I reviewed patient records, labs, notes, testing and imaging myself where available.  Lab Results  Component Value Date   WBC 6.8 01/23/2015   HGB 13.2 01/23/2015   HCT 39 01/23/2015   MCV 96.1 09/02/2014   PLT 150  01/23/2015      Component Value Date/Time   NA 144 01/23/2015   NA 137 09/02/2014 1458   K 4.0 01/23/2015   CL 104 09/02/2014 1458   CO2 29 09/02/2014 1458   GLUCOSE 76 09/02/2014 1458   BUN 13 01/23/2015   BUN 16 09/02/2014 1458   CREATININE 0.7 01/23/2015   CREATININE 0.78 09/02/2014 1458   CREATININE 0.81 07/01/2013 1324   CALCIUM 9.7 09/02/2014 1458   PROT 7.1 09/02/2014 1458   PROT 6.5 07/07/2014 1246   ALBUMIN 4.3 09/02/2014 1458   AST 19 01/23/2015   ALT 19 01/23/2015   ALKPHOS 108 01/23/2015   BILITOT 0.4 09/02/2014 1458   GFRNONAA 68* 02/15/2014 1445   GFRAA 79* 02/15/2014 1445    Lab Results  Component Value Date   HGBA1C 5.2 07/07/2014       ASSESSMENT AND PLAN 74 y.o. year old female has a past medical history of bilateral foot paresthesias for greater than 10 years. Mild length dependent sensory changes most consistent with small fiber neuropathy. Previous Electrodiagnostic study was normal without evidence of large fiber peripheral neuropathy. She also complains of low back pain had previous lumbar decompression surgery. Skin biopsy with small fiber neuropathy. Orthostatic hypotension and Cymbalta was discontinued by her cardiologist. Topamax was discontinued by her psychiatrist. She has had one fall questionable syncopal episode. Stress test was normal  PLAN: Will continue gabapentin to 400 mg 2 tablets  3 times daily and 3 caps at hs (3600mg ) Continue Requip, Trileptal, and compounding cream  Dr.Bethea  recommended neuro stimulation. Once again this as was discussed .Patient is reluctant to do this. We have tried multiple medications for her neuropathy. Stress test normal. Will obtain EEG   syncopal episode versus seizure. May also try Lyrica in the future [name], [facility], [facility], APRN  [facility] [address] [place], [place] [address] [phone]

## 2015-05-11 NOTE — Progress Notes (Signed)
I have reviewed and agreed above plan. 

## 2015-05-11 NOTE — Patient Instructions (Signed)
Increase gabapentin to 2 caps 3 times a day and 3 tabs at night Continue Requip at current dose Continue Trileptal at current dose sodium level 144 in September Follow-up in 6 months next visit with Dr. Krista Blue

## 2015-05-13 ENCOUNTER — Telehealth: Payer: Self-pay

## 2015-05-13 NOTE — Telephone Encounter (Signed)
I contacted [facility].  Spoke with Darren.  He said the patient has refills on file, and they will notify us when additional refills are needed.  Indicates the patient would just need to contact them to request Rx, however, they will kindly reach out to the patient today regarding Rx.

## 2015-05-15 DIAGNOSIS — N3946 Mixed incontinence: Secondary | ICD-10-CM | POA: Diagnosis not present

## 2015-05-15 DIAGNOSIS — N3000 Acute cystitis without hematuria: Secondary | ICD-10-CM | POA: Diagnosis not present

## 2015-05-27 ENCOUNTER — Encounter: Payer: Self-pay | Admitting: *Deleted

## 2015-05-29 DIAGNOSIS — M25562 Pain in left knee: Secondary | ICD-10-CM | POA: Diagnosis not present

## 2015-05-29 DIAGNOSIS — M545 Low back pain: Secondary | ICD-10-CM | POA: Diagnosis not present

## 2015-05-29 DIAGNOSIS — M25561 Pain in right knee: Secondary | ICD-10-CM | POA: Diagnosis not present

## 2015-06-10 ENCOUNTER — Ambulatory Visit (INDEPENDENT_AMBULATORY_CARE_PROVIDER_SITE_OTHER): Payer: Medicare Other | Admitting: Neurology

## 2015-06-10 DIAGNOSIS — R55 Syncope and collapse: Secondary | ICD-10-CM | POA: Diagnosis not present

## 2015-06-10 DIAGNOSIS — R269 Unspecified abnormalities of gait and mobility: Secondary | ICD-10-CM

## 2015-06-12 ENCOUNTER — Telehealth: Payer: Self-pay | Admitting: Nurse Practitioner

## 2015-06-12 MED ORDER — BACLOFEN 10 MG PO TABS: 10.0000 mg | ORAL_TABLET | Freq: Three times a day (TID) | ORAL | Status: DC

## 2015-06-12 NOTE — Procedures (Signed)
   HISTORY: 75 years old female, with passing out episode, syncope versus seizure.  TECHNIQUE:  16 channel EEG was performed based on standard 10-16 international system. One channel was dedicated to EKG, which has demonstrates normal sinus rhythm of beats per minutes.  Upon awakening, the posterior background activity was well-developed, in alpha range, 8 Hz, with amplitude of 40 microvoltage, reactive to eye opening and closure.  There was no evidence of epileptiform discharge.  Photic stimulation was performed, which induced a symmetric photic driving.  Hyperventilation was performed, there was no abnormality elicit.  Stage II sleep was achieved as evident by K complexes and sleep spindle  CONCLUSION: This is a  normal awake and sleep EEG.  There is no electrodiagnostic evidence of epileptiform discharge

## 2015-06-12 NOTE — Telephone Encounter (Signed)
Patient also wants to advise on the bottom of her foot from toes to ankle there is a muscle on bottom of foot that seems to be pulling her toes backwards. "I really need help".

## 2015-06-12 NOTE — Telephone Encounter (Signed)
I have called patient, she complains of pins and needles at the top of her feet, muscle pulling at the bottom of her feet.  She was seen by Hoyle Sauer in May 11, 2015, tried multiple meds in the past, she is now taking gabapentin 400mg  ii tid, trileptal 150mg  bid,   I add on Baclofen 10mg  tid.

## 2015-06-12 NOTE — Telephone Encounter (Signed)
Patient called, states she had EEG on the 11th of January. Patient requests something for her feet, "feet are burning and stinging from toes all the way up to her ankle".

## 2015-06-15 ENCOUNTER — Telehealth: Payer: Self-pay | Admitting: *Deleted

## 2015-06-15 NOTE — Telephone Encounter (Signed)
I called the patient. She stated that her feet are burning really bad. I explained that it will take a few days for the Baclofen to take full effect. She stated that she is miserable. I advised that I would ask Dr. Krista Blue if there is anything else she can have for the pain.

## 2015-06-15 NOTE — Telephone Encounter (Signed)
I called pt and relayed the EEG results (normal).  She then asked about new medication that Dr. Krista Blue put her on Friday.  Baclofen 10mg  po tid.  She took Saturday 06-13-15 as prescribed.   She stated she felt funny, "odd".  (dizzy?).   It did help with her pain.  She took on Sunday 10mg -5mg -5mg .  Noted pain came back but not as bad and did not have odd feeling.   I asked if pain worse at anytime.  She stated that she awakened in the night.  I told her to take 10mg -5mg -10mg  today and see how she does.  I would forward to CM/NP.

## 2015-06-15 NOTE — Telephone Encounter (Signed)
-----   Message from Dennie Bible, NP sent at 06/12/2015  4:42 PM EST ----- Normal EEG please call

## 2015-06-15 NOTE — Telephone Encounter (Signed)
She has chronic bilateral feet paresthesia, already on polypharmacy treatment, will stay on current medication this point

## 2015-06-15 NOTE — Telephone Encounter (Signed)
Patient called back requesting to speak with [facility], please call [phone].

## 2015-06-15 NOTE — Telephone Encounter (Signed)
I called the patient and explained that she should try taking Baclofen 10 mg 10-5-10mg . I also advised that the side effects should hopefully go away soon. She verbalized understanding.

## 2015-06-15 NOTE — Telephone Encounter (Signed)
I agree with Baclofen 10mg  10-5-10mg ,  Please tell her, the longer she takes the medications, the better her body adapt to it, the side effect will likely go away soon

## 2015-06-15 NOTE — Telephone Encounter (Signed)
Dr. Krista Blue ordered this so I would check with her, maybe reduce Baclofen to twice daily especially if helped with pain

## 2015-06-17 DIAGNOSIS — R350 Frequency of micturition: Secondary | ICD-10-CM | POA: Diagnosis not present

## 2015-06-17 DIAGNOSIS — N393 Stress incontinence (female) (male): Secondary | ICD-10-CM | POA: Diagnosis not present

## 2015-06-17 DIAGNOSIS — Z0000 Encounter for general adult medical examination without abnormal findings: Secondary | ICD-10-CM | POA: Diagnosis not present

## 2015-06-17 DIAGNOSIS — N3946 Mixed incontinence: Secondary | ICD-10-CM | POA: Diagnosis not present

## 2015-06-19 DIAGNOSIS — M25562 Pain in left knee: Secondary | ICD-10-CM | POA: Diagnosis not present

## 2015-06-19 DIAGNOSIS — M25561 Pain in right knee: Secondary | ICD-10-CM | POA: Diagnosis not present

## 2015-06-19 DIAGNOSIS — M545 Low back pain: Secondary | ICD-10-CM | POA: Diagnosis not present

## 2015-06-23 ENCOUNTER — Other Ambulatory Visit: Payer: Self-pay | Admitting: General Surgery

## 2015-06-26 DIAGNOSIS — H1131 Conjunctival hemorrhage, right eye: Secondary | ICD-10-CM | POA: Diagnosis not present

## 2015-06-26 DIAGNOSIS — H02403 Unspecified ptosis of bilateral eyelids: Secondary | ICD-10-CM | POA: Diagnosis not present

## 2015-06-26 DIAGNOSIS — Z961 Presence of intraocular lens: Secondary | ICD-10-CM | POA: Diagnosis not present

## 2015-06-26 DIAGNOSIS — Z79899 Other long term (current) drug therapy: Secondary | ICD-10-CM | POA: Diagnosis not present

## 2015-06-26 DIAGNOSIS — H02836 Dermatochalasis of left eye, unspecified eyelid: Secondary | ICD-10-CM | POA: Diagnosis not present

## 2015-06-26 DIAGNOSIS — H02833 Dermatochalasis of right eye, unspecified eyelid: Secondary | ICD-10-CM | POA: Diagnosis not present

## 2015-06-26 DIAGNOSIS — M25462 Effusion, left knee: Secondary | ICD-10-CM | POA: Diagnosis not present

## 2015-06-26 DIAGNOSIS — H02401 Unspecified ptosis of right eyelid: Secondary | ICD-10-CM | POA: Diagnosis not present

## 2015-06-26 DIAGNOSIS — H01009 Unspecified blepharitis unspecified eye, unspecified eyelid: Secondary | ICD-10-CM | POA: Diagnosis not present

## 2015-06-26 DIAGNOSIS — M25562 Pain in left knee: Secondary | ICD-10-CM | POA: Diagnosis not present

## 2015-06-26 DIAGNOSIS — H2512 Age-related nuclear cataract, left eye: Secondary | ICD-10-CM | POA: Diagnosis not present

## 2015-06-26 DIAGNOSIS — H40003 Preglaucoma, unspecified, bilateral: Secondary | ICD-10-CM | POA: Diagnosis not present

## 2015-06-26 DIAGNOSIS — Z7982 Long term (current) use of aspirin: Secondary | ICD-10-CM | POA: Diagnosis not present

## 2015-06-30 DIAGNOSIS — M25562 Pain in left knee: Secondary | ICD-10-CM | POA: Diagnosis not present

## 2015-06-30 DIAGNOSIS — M545 Low back pain: Secondary | ICD-10-CM | POA: Diagnosis not present

## 2015-07-06 ENCOUNTER — Ambulatory Visit: Payer: Medicare Other | Admitting: Nurse Practitioner

## 2015-07-08 DIAGNOSIS — M25562 Pain in left knee: Secondary | ICD-10-CM | POA: Diagnosis not present

## 2015-07-21 DIAGNOSIS — M25562 Pain in left knee: Secondary | ICD-10-CM | POA: Diagnosis not present

## 2015-07-21 DIAGNOSIS — M25561 Pain in right knee: Secondary | ICD-10-CM | POA: Diagnosis not present

## 2015-07-27 DIAGNOSIS — C250 Malignant neoplasm of head of pancreas: Secondary | ICD-10-CM | POA: Diagnosis not present

## 2015-07-27 DIAGNOSIS — K432 Incisional hernia without obstruction or gangrene: Secondary | ICD-10-CM | POA: Diagnosis not present

## 2015-07-28 ENCOUNTER — Encounter (HOSPITAL_COMMUNITY): Payer: Self-pay | Admitting: Emergency Medicine

## 2015-07-28 ENCOUNTER — Telehealth: Payer: Self-pay | Admitting: Family Medicine

## 2015-07-28 ENCOUNTER — Emergency Department (HOSPITAL_COMMUNITY): Payer: Medicare Other

## 2015-07-28 ENCOUNTER — Observation Stay (HOSPITAL_COMMUNITY)
Admission: EM | Admit: 2015-07-28 | Discharge: 2015-07-29 | Disposition: A | Discharge status: Home / Self Care | Payer: Medicare Other | Attending: Family Medicine | Admitting: Family Medicine

## 2015-07-28 DIAGNOSIS — G8929 Other chronic pain: Secondary | ICD-10-CM | POA: Diagnosis not present

## 2015-07-28 DIAGNOSIS — E785 Hyperlipidemia, unspecified: Secondary | ICD-10-CM | POA: Diagnosis not present

## 2015-07-28 DIAGNOSIS — I69344 Monoplegia of lower limb following cerebral infarction affecting left non-dominant side: Secondary | ICD-10-CM | POA: Insufficient documentation

## 2015-07-28 DIAGNOSIS — I341 Nonrheumatic mitral (valve) prolapse: Secondary | ICD-10-CM | POA: Diagnosis not present

## 2015-07-28 DIAGNOSIS — R0789 Other chest pain: Principal | ICD-10-CM | POA: Insufficient documentation

## 2015-07-28 DIAGNOSIS — Z96652 Presence of left artificial knee joint: Secondary | ICD-10-CM | POA: Diagnosis not present

## 2015-07-28 DIAGNOSIS — I10 Essential (primary) hypertension: Secondary | ICD-10-CM | POA: Diagnosis not present

## 2015-07-28 DIAGNOSIS — Z90710 Acquired absence of both cervix and uterus: Secondary | ICD-10-CM | POA: Insufficient documentation

## 2015-07-28 DIAGNOSIS — N3020 Other chronic cystitis without hematuria: Secondary | ICD-10-CM | POA: Diagnosis not present

## 2015-07-28 DIAGNOSIS — K219 Gastro-esophageal reflux disease without esophagitis: Secondary | ICD-10-CM | POA: Diagnosis not present

## 2015-07-28 DIAGNOSIS — H409 Unspecified glaucoma: Secondary | ICD-10-CM | POA: Diagnosis not present

## 2015-07-28 DIAGNOSIS — E039 Hypothyroidism, unspecified: Secondary | ICD-10-CM | POA: Diagnosis present

## 2015-07-28 DIAGNOSIS — Z7982 Long term (current) use of aspirin: Secondary | ICD-10-CM | POA: Diagnosis not present

## 2015-07-28 DIAGNOSIS — R55 Syncope and collapse: Secondary | ICD-10-CM | POA: Insufficient documentation

## 2015-07-28 DIAGNOSIS — D696 Thrombocytopenia, unspecified: Secondary | ICD-10-CM | POA: Insufficient documentation

## 2015-07-28 DIAGNOSIS — Z79899 Other long term (current) drug therapy: Secondary | ICD-10-CM | POA: Insufficient documentation

## 2015-07-28 DIAGNOSIS — F329 Major depressive disorder, single episode, unspecified: Secondary | ICD-10-CM | POA: Insufficient documentation

## 2015-07-28 DIAGNOSIS — K469 Unspecified abdominal hernia without obstruction or gangrene: Secondary | ICD-10-CM | POA: Insufficient documentation

## 2015-07-28 DIAGNOSIS — Z8507 Personal history of malignant neoplasm of pancreas: Secondary | ICD-10-CM | POA: Diagnosis not present

## 2015-07-28 DIAGNOSIS — R1013 Epigastric pain: Secondary | ICD-10-CM | POA: Diagnosis not present

## 2015-07-28 DIAGNOSIS — N3946 Mixed incontinence: Secondary | ICD-10-CM | POA: Diagnosis not present

## 2015-07-28 DIAGNOSIS — G629 Polyneuropathy, unspecified: Secondary | ICD-10-CM | POA: Insufficient documentation

## 2015-07-28 DIAGNOSIS — Z0000 Encounter for general adult medical examination without abnormal findings: Secondary | ICD-10-CM | POA: Diagnosis not present

## 2015-07-28 DIAGNOSIS — R079 Chest pain, unspecified: Secondary | ICD-10-CM | POA: Diagnosis present

## 2015-07-28 LAB — CBC
HEMATOCRIT: 37.7 % (ref 36.0–46.0)
Hemoglobin: 12.3 g/dL (ref 12.0–15.0)
MCH: 32.4 pg (ref 26.0–34.0)
MCHC: 32.6 g/dL (ref 30.0–36.0)
MCV: 99.2 fL (ref 78.0–100.0)
PLATELETS: 143 10*3/uL — AB (ref 150–400)
RBC: 3.8 MIL/uL — ABNORMAL LOW (ref 3.87–5.11)
RDW: 14.6 % (ref 11.5–15.5)
WBC: 5.2 10*3/uL (ref 4.0–10.5)

## 2015-07-28 LAB — LIPASE, BLOOD: LIPASE: 15 U/L (ref 11–51)

## 2015-07-28 LAB — BASIC METABOLIC PANEL
Anion gap: 9 (ref 5–15)
BUN: 10 mg/dL (ref 6–20)
CO2: 30 mmol/L (ref 22–32)
Calcium: 9 mg/dL (ref 8.9–10.3)
Chloride: 103 mmol/L (ref 101–111)
Creatinine, Ser: 0.76 mg/dL (ref 0.44–1.00)
GFR calc Af Amer: >60 mL/min (ref >60)
GLUCOSE: 92 mg/dL (ref 65–99)
POTASSIUM: 3.8 mmol/L (ref 3.5–5.1)
Sodium: 142 mmol/L (ref 135–145)

## 2015-07-28 LAB — HEPATIC FUNCTION PANEL
ALT: 28 U/L (ref 14–54)
AST: 26 U/L (ref 15–41)
Albumin: 4.4 g/dL (ref 3.5–5.0)
Alkaline Phosphatase: 111 U/L (ref 38–126)
BILIRUBIN DIRECT: 0.1 mg/dL (ref 0.1–0.5)
BILIRUBIN INDIRECT: 0.5 mg/dL (ref 0.3–0.9)
TOTAL PROTEIN: 7.3 g/dL (ref 6.5–8.1)
Total Bilirubin: 0.6 mg/dL (ref 0.3–1.2)

## 2015-07-28 LAB — I-STAT TROPONIN, ED: TROPONIN I, POC: 0 ng/mL (ref 0.00–0.08)

## 2015-07-28 MED ORDER — MORPHINE SULFATE (PF) 4 MG/ML IV SOLN
4.0000 mg | Freq: Once | INTRAVENOUS | Status: AC
  Administered 2015-07-28: 4 mg via INTRAVENOUS
  Filled 2015-07-28: qty 1

## 2015-07-28 MED ORDER — GI COCKTAIL ~~LOC~~
30.0000 mL | Freq: Once | ORAL | Status: AC
Start: 2015-07-28 — End: 2015-07-28
  Administered 2015-07-28: 30 mL via ORAL
  Filled 2015-07-28: qty 30

## 2015-07-28 NOTE — H&P (Signed)
[facility] History and Physical  [name] [id number] DOB: [birth year] DOA: 07/28/2015  Referring physician: Mr.Bowie. PCP: [name], MD  Specialists: Dr.Byerly. General surgery.  Chief Complaint: Abdominal pain.  HPI: [name] is a 75 y.o. female with history of pancreatic cancers status post Whipple's procedure in remission, abdominal hernia, hypothyroidism and hypertension presents to the ER because of chest pain. Patient started expressing chest pain this afternoon. Patient was at home. Pain is retrosternal radiating to her neck burning in sensation. Pain persisted till patient based ER. Patient did try some Tums which case some relief. Denies any associated nausea vomiting shortness of breath. EKG was showing nonspecific changes and chest x-ray was unremarkable. Patient has been admitted for further observation for chest pain. Presently chest pain-free.   Review of Systems: As presented in the history of presenting illness, rest negative.  Past Medical History  Diagnosis Date  . Osteoarthritis   . Hiatal hernia   . Chronic maxillary sinusitis     neti pot  . Depression     alone a lot  . Eustachian tube dysfunction   . Tardive dyskinesia     possibly reglan, vitamin E helps  . Mitral valve prolapse     antibiotics before dental procedures  . GERD (gastroesophageal reflux disease)   . Allergic rhinitis   . Glaucoma   . Hypothyroid   . Hypertension   . Heart murmur     hx. "MVP" -predental antibiotics  . Memory loss     short term memory loss  . Urgency of urination     some UTI in past  . Stroke [facility])     mini storkes left leg paraylsis. patient denies weakness 01/08/14.   . Cancer ([place]) 07/10/13    Pancreatic cancer with MRI scan 06-19-13   Past Surgical History  Procedure Laterality Date  . Knee surgery Left     x 5, total knee Left knee  . 1 baker cyst removed    . Dilation and curettage of uterus      x3  . Lumbar spine surgery       x2  . Abdominal hysterectomy      including ovaries  . Lumbar spine surgery      cyst  . Joint replacement      LTKA  . Blepharoplasty Bilateral     with cataract surgery  . Eye surgery Right     cataract  . Eus N/A 07/10/2013    Procedure: ESOPHAGEAL ENDOSCOPIC ULTRASOUND (EUS) RADIAL;  Surgeon: Arta Silence, MD;  Location: [facility] ENDOSCOPY;  Service: Endoscopy;  Laterality: N/A;  . Fine needle aspiration N/A 07/10/2013    Procedure: FINE NEEDLE ASPIRATION (FNA) LINEAR;  Surgeon: Arta Silence, MD;  Location: [facility] ENDOSCOPY;  Service: Endoscopy;  Laterality: N/A;  possible fna  . Back surgery      fusion  . Breast surgery      Biopsy left 2 times  . Colonoscopy w/ polypectomy      2004 last colonoscopy, no polyps  . Laparoscopy N/A 08/07/2013    Procedure: LAPAROSCOPY DIAGNOSTIC;  Surgeon: [name], MD;  Location: [place];  Service: General;  Laterality: N/A;  . Whipple procedure N/A 08/07/2013    Procedure: WHIPPLE PROCEDURE;  Surgeon: [name], MD;  Location: [place];  Service: General;  Laterality: N/A;  . Esophagogastroduodenoscopy N/A 09/11/2013    Procedure: ESOPHAGOGASTRODUODENOSCOPY (EGD);  Surgeon: Cleotis Nipper, MD;  Location: [facility] ENDOSCOPY;  Service: Endoscopy;  Laterality:  N/A;  Moderate sedation okay if MAC not available   Social History:  reports that she has never smoked. She has never used smokeless tobacco. She reports that she does not drink alcohol or use illicit drugs. Where does patient live home. Can patient participate in ADLs? Yes.  Allergies  Allergen Reactions  . Other Other (See Comments)    According to patient Trilafor and Reglan cause Tardive Dyskinesia  . Metoclopramide Hcl Other (See Comments)    Shaking; caused tardive dyskinesia  . Niacin Other (See Comments)    shaking  . Trovan [Alatrofloxacin Mesylate] Other (See Comments)    Caused shaking and nervousness  . Nortriptyline Other (See Comments)    Dizziness   . Benzocaine-Resorcinol Rash   . Celecoxib Other (See Comments)    unknown  . Glucosamine Other (See Comments)    unknown  . Phenazopyridine Hcl Other (See Comments)    unknown  . Sulfa Antibiotics Nausea And Vomiting and Rash  . Sulfonamide Derivatives Nausea And Vomiting and Rash    Family History:  Family History  Problem Relation Age of Onset  . Cancer Mother     Breast Cancer with Metastatic disease  . Alzheimer's disease Father   . Other Brother 35    GSW      Prior to Admission medications   Medication Sig Start Date End Date Taking? Authorizing Provider  acetaminophen-codeine (TYLENOL #3) 300-30 MG tablet Take 1 tablet by mouth every 4 (four) hours as needed for moderate pain or severe pain.   Yes Historical Provider, MD  aspirin 81 MG tablet Take 81 mg by mouth at bedtime.    Yes Historical Provider, MD  atenolol (TENORMIN) 25 MG tablet Take 1 tablet by mouth  every morning 02/09/15  Yes Marin Olp, MD  baclofen (LIORESAL) 10 MG tablet Take 1 tablet (10 mg total) by mouth 3 (three) times daily. 06/12/15  Yes Marcial Pacas, MD  carboxymethylcellulose (REFRESH TEARS) 0.5 % SOLN Place 1 drop into both eyes 2 (two) times daily.    Yes Historical Provider, MD  cholecalciferol (VITAMIN D) 1000 UNITS tablet Take 1,000 Units by mouth 2 (two) times daily.    Yes Historical Provider, MD  cyanocobalamin 2000 MCG tablet Take 2,000 mcg by mouth daily at 12 noon.    Yes Historical Provider, MD  docusate sodium (COLACE) 100 MG capsule Take 200 mg by mouth at bedtime.   Yes Historical Provider, MD  feeding supplement (BOOST HIGH PROTEIN) LIQD Take 1 Container by mouth daily.   Yes Historical Provider, MD  FLUoxetine (PROZAC) 20 MG capsule Take 1 capsule (20 mg total) by mouth daily. 11/17/14  Yes Marin Olp, MD  gabapentin (NEURONTIN) 400 MG capsule 2 caps 3 times daily and 3 at hs 05/11/15  Yes Dennie Bible, NP  hydrocortisone cream 1 % Apply 1 application topically 2 (two) times daily. For rectal cyst    Yes Historical Provider, MD  levothyroxine (SYNTHROID, LEVOTHROID) 175 MCG tablet Take 1 tablet by mouth  daily before breakfast 02/09/15  Yes Marin Olp, MD  lipase/protease/amylase (CREON) 12000 UNITS CPEP capsule Take 2 capsules (24,000 Units total) by mouth 3 (three) times daily before meals. 05/25/14  Yes Michael Boston, MD  loratadine (CLARITIN) 10 MG tablet Take 10 mg by mouth daily.   Yes Historical Provider, MD  lovastatin (MEVACOR) 40 MG tablet Take 1 tablet by mouth at  bedtime 02/09/15  Yes Marin Olp, MD  Multiple Vitamin (MULTIVITAMIN WITH  MINERALS) TABS tablet Take 1 tablet by mouth daily.   Yes Historical Provider, MD  omeprazole (PRILOSEC OTC) 20 MG tablet Take 20 mg by mouth 2 (two) times daily.    Yes Historical Provider, MD  OXcarbazepine (TRILEPTAL) 150 MG tablet TAKE 1/2 TABLET TWICE A DAY FOR 1 WEEK THEN 1 TABLET TWICE A DAY 05/11/15  Yes Dennie Bible, NP  polycarbophil (FIBERCON) 625 MG tablet Take 625 mg by mouth daily.    Yes Historical Provider, MD  rOPINIRole (REQUIP) 0.5 MG tablet Take 1 tablet (0.5 mg total) by mouth at bedtime. 05/11/15  Yes Dennie Bible, NP  Simethicone 180 MG CAPS Take 180 mg by mouth 4 (four) times daily.    Yes Historical Provider, MD  UNABLE TO FIND Apply 1 application topically 4 (four) times daily. Doxep/Amanta/DXM/Lidocaine 25 mg for her feet   Yes Historical Provider, MD  vitamin E 400 UNIT capsule Take 800 Units by mouth 2 (two) times daily. For tartive dyskinesia   Yes Historical Provider, MD  acetaminophen (TYLENOL) 325 MG tablet Take 650 mg by mouth every 6 (six) hours as needed for mild pain, moderate pain or fever.    Historical Provider, MD  aluminum & magnesium hydroxide-simethicone (MYLANTA) 500-450-40 MG/5ML suspension Take 5 mLs by mouth every 6 (six) hours as needed for indigestion.    Historical Provider, MD  meclizine (ANTIVERT) 25 MG tablet Take 25 mg by mouth 3 (three) times daily as needed for dizziness.     Historical Provider, MD  promethazine (PHENERGAN) 25 MG tablet Take 25 mg by mouth every 6 (six) hours as needed for nausea or vomiting.    Historical Provider, MD    Physical Exam: Filed Vitals:   07/28/15 1644 07/28/15 2121 07/28/15 2343  BP: 161/70 193/82 179/83  Pulse: 63 62   Temp: 98.2 F (36.8 C)    TempSrc: Oral    Resp: 17 18 17   SpO2: 98% 98% 95%     General:  Moderately built and nourished.  Eyes: Anicteric no pallor.  ENT: No discharge from the ears eyes nose mouth.  Neck: No mass felt.  Cardiovascular: S1-S2 heard.  Respiratory: No rhonchi or crepitations.  Abdomen: Soft nontender bowel sounds present.  Skin: No rash.  Musculoskeletal: No edema.  Psychiatric: Appears normal.  Neurologic: Alert and awake oriented to time place and person. Moves all extremities.  Labs on Admission:  Basic Metabolic Panel:  Recent Labs Lab 07/28/15 1727  NA 142  K 3.8  CL 103  CO2 30  GLUCOSE 92  BUN 10  CREATININE 0.76  CALCIUM 9.0   Liver Function Tests:  Recent Labs Lab 07/28/15 1727  AST 26  ALT 28  ALKPHOS 111  BILITOT 0.6  PROT 7.3  ALBUMIN 4.4    Recent Labs Lab 07/28/15 1727  LIPASE 15   No results for input(s): AMMONIA in the last 168 hours. CBC:  Recent Labs Lab 07/28/15 1727  WBC 5.2  HGB 12.3  HCT 37.7  MCV 99.2  PLT 143*   Cardiac Enzymes: No results for input(s): CKTOTAL, CKMB, CKMBINDEX, TROPONINI in the last 168 hours.  BNP (last 3 results) No results for input(s): BNP in the last 8760 hours.  ProBNP (last 3 results) No results for input(s): PROBNP in the last 8760 hours.  CBG: No results for input(s): GLUCAP in the last 168 hours.  Radiological Exams on Admission: Dg Chest 2 View  07/28/2015  CLINICAL DATA:  Central chest pain since this  morning radiating to left polyp. Dizziness. EXAM: CHEST  2 VIEW COMPARISON:  09/02/2013 FINDINGS: The heart size and mediastinal contours are within normal limits. Both  lungs are clear. Elevation of right hemidiaphragm again noted. Several mild mid thoracic spine vertebral body compression fracture deformities are noted. IMPRESSION: Stable elevation of right hemidiaphragm. No active cardiopulmonary disease. Electronically Signed   By: Earle Gell M.D.   On: 07/28/2015 17:22    EKG: Independently reviewed. Normal sinus rhythm with nonspecific ST changes.  Assessment/Plan Principal Problem:   Chest pain Active Problems:   Hypothyroidism   Essential hypertension   HLD (hyperlipidemia)   1. Chest pain - appears atypical. Patient has had a stress test in October 2016 in [facility] which was negative for ischemia. At this time we will cycle cardiac markers and continue with aspirin. Will keep patient nothing by mouth in a.m. in anticipation of possible procedure. Cardiology consult requested. 2. Hypertension uncontrolled - patient's blood pressure was elevated. At this time will continue the lawn and I have placed patient on when necessary IV hydralazine. Closely follow blood pressure trends. 3. Hypothyroidism on Synthroid. 4. History of pancreatic cancer status post Whipple procedure is under remission with chronic abdominal pain - being followed by Dr. [name]. 5. Hyperlipidemia - on statins. 6. Abdominal hernia.   DVT Prophylaxis Lovenox.  Code Status: Full code.  Family Communication: Discussed with patient's son.  Disposition Plan: Admit for observation.    KAKRAKANDY,ARSHAD N. [facility] Pager [phone].  If 7PM-7AM, please contact night-coverage [email or web address] Password [facility] 07/28/2015, 11:49 PM

## 2015-07-28 NOTE — ED Notes (Signed)
Patient presents for centralized CP, described as burning onset 1200 today, dizziness, left jaw pain and HA. Denies N/V/D, diaphoresis, lightheadedness. Neurologically intact.

## 2015-07-28 NOTE — Telephone Encounter (Signed)
Dr. Yong Channel said have pt come right away and we will work her in but it may not be until 5:00 until we can see her.

## 2015-07-28 NOTE — Telephone Encounter (Signed)
Pt son call pt went to see her urologist and she was complaining of chest pain and dizzyness and he would not treat her until she saw her pcp. Son is asking if she can be today    [phone]

## 2015-07-28 NOTE — Telephone Encounter (Signed)
Patient presented and was triaged by [facility] team. She had chest pain in chest described as burning sensation diffusely. She took some tums earlier and symptoms improved some but did not resolve. Over last few minutes has developed pain into her left neck and jaw. She does not have shortness of breath, diaphoresis. She does have some dizziness but she has had intermittent issues with this in the past.   She has risk factors of hypertension, age, hyperlipidemia.   I strongly suspect reflux but given radiation to her neck and jaw I do not feel even with EKG and troponin that we can rule out cardiac cause. Also patient has some memory loss suspect at least mild cognitive impairment and is not always best historian. Later in discussion explained she has discomfort with a burning through her forehead so symptoms seem to be evolving (which is not atypical for her).  She will at least need a troponin and serial troponin. Family and patient opted to proceed immediately to the ED. We jointly agreed to hold off on EKG.

## 2015-07-28 NOTE — Telephone Encounter (Signed)
Spoke with son he will bring her in

## 2015-07-28 NOTE — ED Provider Notes (Signed)
CSN: [id number]     Arrival date & time 07/28/15  1604 History   First MD Initiated Contact with Patient 07/28/15 2124     Chief Complaint  Patient presents with  . Chest Pain     (Consider location/radiation/quality/duration/timing/severity/associated sxs/prior Treatment) HPI   75 year old female with history of GERD, hypertension, prior stroke, pancreatic cancer presenting with complaints of chest pain. Patient describes gradual onset of tearing sensation to her central chest that started after lunch today and has been persistent until now. She also endorsed having jaw pain, feeling dizziness lightheadedness having a headache. She rates the pain as 4 out of 10. She did try Tums earlier without improvement. She denies having fever, URI symptoms, productive cough, shortness of breath, exertional chest pain, diaphoresis, back pain arm pain. She did have one similar episode of pain at this in the past with left-sided weakness 4 years ago and was told that she may have a mini stroke. Aside from taking daily aspirin she denies taking any NSAIDs on a regular basis. Patient has chronic back pain which she attributed to her pancreatic cancer in the past requiring Whipple procedure 2 years ago. She denies any prior history of MI and did not recall any cardiac stress tests recently. No prior history of PE or DVT. She denies any prior history of shingle.  Past Medical History  Diagnosis Date  . Osteoarthritis   . Hiatal hernia   . Chronic maxillary sinusitis     neti pot  . Depression     alone a lot  . Eustachian tube dysfunction   . Tardive dyskinesia     possibly reglan, vitamin E helps  . Mitral valve prolapse     antibiotics before dental procedures  . GERD (gastroesophageal reflux disease)   . Allergic rhinitis   . Glaucoma   . Hypothyroid   . Hypertension   . Heart murmur     hx. "MVP" -predental antibiotics  . Memory loss     short term memory loss  . Urgency of urination     some  UTI in past  . Stroke [facility])     mini storkes left leg paraylsis. patient denies weakness 01/08/14.   . Cancer ([place]) 07/10/13    Pancreatic cancer with MRI scan 06-19-13   Past Surgical History  Procedure Laterality Date  . Knee surgery Left     x 5, total knee Left knee  . 1 baker cyst removed    . Dilation and curettage of uterus      x3  . Lumbar spine surgery      x2  . Abdominal hysterectomy      including ovaries  . Lumbar spine surgery      cyst  . Joint replacement      LTKA  . Blepharoplasty Bilateral     with cataract surgery  . Eye surgery Right     cataract  . Eus N/A 07/10/2013    Procedure: ESOPHAGEAL ENDOSCOPIC ULTRASOUND (EUS) RADIAL;  Surgeon: Arta Silence, MD;  Location: [facility] ENDOS[facility];  Service: Endoscopy;  Laterality: N/A;  . Fine needle aspiration N/A 07/10/2013    Procedure: FINE NEEDLE ASPIRATION (FNA) LINEAR;  Surgeon: Arta Silence, MD;  Location: [facility]OS[facility];  Service: Endoscopy;  Laterality: N/A;  possible fna  . Back surgery      fusion  . Breast surgery      Biopsy left 2 times  . Colonoscopy w/ polypectomy      2004 last colonoscopy,  no polyps  . Laparoscopy N/A 08/07/2013    Procedure: LAPAROSCOPY DIAGNOSTIC;  Surgeon: [name], MD;  Location: [place];  Service: General;  Laterality: N/A;  . Whipple procedure N/A 08/07/2013    Procedure: WHIPPLE PROCEDURE;  Surgeon: [name], MD;  Location: [place];  Service: General;  Laterality: N/A;  . Esophagogastroduodenoscopy N/A 09/11/2013    Procedure: ESOPHAGOGASTRODUODENOSCOPY (EGD);  Surgeon: Cleotis Nipper, MD;  Location: [facility] ENDOSCOPY;  Service: Endoscopy;  Laterality: N/A;  Moderate sedation okay if MAC not available   Family History  Problem Relation Age of Onset  . Cancer Mother     Breast Cancer with Metastatic disease  . Alzheimer's disease Father   . Other Brother 44    GSW   Social History  Substance Use Topics  . Smoking status: Never Smoker   . Smokeless tobacco: Never Used  .  Alcohol Use: No   OB History    No data available     Review of Systems  All other systems reviewed and are negative.     Allergies  Other; Metoclopramide hcl; Niacin; Trovan; Nortriptyline; Benzocaine-resorcinol; Celecoxib; Glucosamine; Phenazopyridine hcl; Sulfa antibiotics; and Sulfonamide derivatives  Home Medications   Prior to Admission medications   Medication Sig Start Date End Date Taking? Authorizing Provider  acetaminophen-codeine (TYLENOL #3) 300-30 MG tablet Take 1 tablet by mouth every 4 (four) hours as needed for moderate pain or severe pain.   Yes Historical Provider, MD  aspirin 81 MG tablet Take 81 mg by mouth at bedtime.    Yes Historical Provider, MD  atenolol (TENORMIN) 25 MG tablet Take 1 tablet by mouth  every morning 02/09/15  Yes Marin Olp, MD  baclofen (LIORESAL) 10 MG tablet Take 1 tablet (10 mg total) by mouth 3 (three) times daily. 06/12/15  Yes Marcial Pacas, MD  carboxymethylcellulose (REFRESH TEARS) 0.5 % SOLN Place 1 drop into both eyes 2 (two) times daily.    Yes Historical Provider, MD  cholecalciferol (VITAMIN D) 1000 UNITS tablet Take 1,000 Units by mouth 2 (two) times daily.    Yes Historical Provider, MD  cyanocobalamin 2000 MCG tablet Take 2,000 mcg by mouth daily at 12 noon.    Yes Historical Provider, MD  docusate sodium (COLACE) 100 MG capsule Take 200 mg by mouth at bedtime.   Yes Historical Provider, MD  feeding supplement (BOOST HIGH PROTEIN) LIQD Take 1 Container by mouth daily.   Yes Historical Provider, MD  FLUoxetine (PROZAC) 20 MG capsule Take 1 capsule (20 mg total) by mouth daily. 11/17/14  Yes Marin Olp, MD  gabapentin (NEURONTIN) 400 MG capsule 2 caps 3 times daily and 3 at hs 05/11/15  Yes Dennie Bible, NP  hydrocortisone cream 1 % Apply 1 application topically 2 (two) times daily. For rectal cyst   Yes Historical Provider, MD  levothyroxine (SYNTHROID, LEVOTHROID) 175 MCG tablet Take 1 tablet by mouth  daily before  breakfast 02/09/15  Yes Marin Olp, MD  lipase/protease/amylase (CREON) 12000 UNITS CPEP capsule Take 2 capsules (24,000 Units total) by mouth 3 (three) times daily before meals. 05/25/14  Yes Michael Boston, MD  loratadine (CLARITIN) 10 MG tablet Take 10 mg by mouth daily.   Yes Historical Provider, MD  lovastatin (MEVACOR) 40 MG tablet Take 1 tablet by mouth at  bedtime 02/09/15  Yes Marin Olp, MD  Multiple Vitamin (MULTIVITAMIN WITH MINERALS) TABS tablet Take 1 tablet by mouth daily.   Yes Historical Provider, MD  omeprazole (PRILOSEC OTC) 20 MG tablet Take 20 mg by mouth 2 (two) times daily.    Yes Historical Provider, MD  OXcarbazepine (TRILEPTAL) 150 MG tablet TAKE 1/2 TABLET TWICE A DAY FOR 1 WEEK THEN 1 TABLET TWICE A DAY 05/11/15  Yes Dennie Bible, NP  polycarbophil (FIBERCON) 625 MG tablet Take 625 mg by mouth daily.    Yes Historical Provider, MD  rOPINIRole (REQUIP) 0.5 MG tablet Take 1 tablet (0.5 mg total) by mouth at bedtime. 05/11/15  Yes Dennie Bible, NP  Simethicone 180 MG CAPS Take 180 mg by mouth 4 (four) times daily.    Yes Historical Provider, MD  UNABLE TO FIND Apply 1 application topically 4 (four) times daily. Doxep/Amanta/DXM/Lidocaine 25 mg for her feet   Yes Historical Provider, MD  vitamin E 400 UNIT capsule Take 800 Units by mouth 2 (two) times daily. For tartive dyskinesia   Yes Historical Provider, MD  acetaminophen (TYLENOL) 325 MG tablet Take 650 mg by mouth every 6 (six) hours as needed for mild pain, moderate pain or fever.    Historical Provider, MD  aluminum & magnesium hydroxide-simethicone (MYLANTA) 500-450-40 MG/5ML suspension Take 5 mLs by mouth every 6 (six) hours as needed for indigestion.    Historical Provider, MD  meclizine (ANTIVERT) 25 MG tablet Take 25 mg by mouth 3 (three) times daily as needed for dizziness.    Historical Provider, MD  promethazine (PHENERGAN) 25 MG tablet Take 25 mg by mouth every 6 (six) hours as needed for  nausea or vomiting.    Historical Provider, MD   BP 193/82 mmHg  Pulse 62  Temp(Src) 98.2 F (36.8 C) (Oral)  Resp 18  SpO2 98% Physical Exam  Constitutional: She is oriented to person, place, and time. She appears well-developed and well-nourished. No distress.  Elderly Caucasian female appears to be in no acute discomfort.  HENT:  Head: Atraumatic.  Eyes: Conjunctivae are normal.  Neck: Neck supple.  Cardiovascular: Normal rate, regular rhythm and intact distal pulses.   Pulmonary/Chest: Effort normal and breath sounds normal. No respiratory distress. She has no wheezes. She has no rales. She exhibits tenderness (Mild anterior chest tenderness on palpation without crepitus or emphysema, no overlying skin changes.).  Abdominal: Soft. Bowel sounds are normal. She exhibits no distension. There is tenderness (Mild epigastric tenderness without guarding or rebound tenderness.).  Musculoskeletal: She exhibits no edema or tenderness.  Neurological: She is alert and oriented to person, place, and time.  Skin: No rash noted.  Psychiatric: She has a normal mood and affect.  Nursing note and vitals reviewed.   ED Course  Procedures (including critical care time) Labs Review Labs Reviewed  CBC - Abnormal; Notable for the following:    RBC 3.80 (*)    Platelets 143 (*)    All other components within normal limits  BASIC METABOLIC PANEL  HEPATIC FUNCTION PANEL  LIPASE, BLOOD  I-STAT TROPOININ, ED    Imaging Review Dg Chest 2 View  07/28/2015  CLINICAL DATA:  Central chest pain since this morning radiating to left polyp. Dizziness. EXAM: CHEST  2 VIEW COMPARISON:  09/02/2013 FINDINGS: The heart size and mediastinal contours are within normal limits. Both lungs are clear. Elevation of right hemidiaphragm again noted. Several mild mid thoracic spine vertebral body compression fracture deformities are noted. IMPRESSION: Stable elevation of right hemidiaphragm. No active cardiopulmonary  disease. Electronically Signed   By: Earle Gell M.D.   On: 07/28/2015 17:22   I have  personally reviewed and evaluated these images and lab results as part of my medical decision-making.   EKG Interpretation None      MDM   Final diagnoses:  Central chest pain    BP 193/82 mmHg  Pulse 62  Temp(Src) 98.2 F (36.8 C) (Oral)  Resp 18  SpO2 98%   9:48 PM Elderly female presents complaining of central chest pain. Given her age, she will need to be admitted for a cardiac rule out. Currently her EKG and labs are reassuring.  10:47 PM Appreciate consultation from [facility] hospital[facility], Dr. Hal Hope who agrees to admit pt to obs, telemetry floor under his care.  He also request LFT and lipase to be added on.  At this time pt is CP free after receiving GI cocktail and Morphine.  She has chronic abd pain from prior pancreatic cancer and does have reproducible epigastric abd pain on exam.  She also have leg pain from neuropathy, this is chronic as well.  PT is admitted for chest pain rule out.    Domenic Moras, PA-C 07/28/15 2251  Dorie Rank, MD 07/28/15 [phone]

## 2015-07-28 NOTE — ED Notes (Signed)
Pt's son:   Shahira Lingg  [phone] (c)  Son has taken pt's black purse and watch home

## 2015-07-28 NOTE — ED Notes (Signed)
Patient with no drift, no facial droop, equal grip, pulses intact, reports decreased sensation to left, steady gait. MD made aware of same.

## 2015-07-29 ENCOUNTER — Encounter (HOSPITAL_COMMUNITY): Payer: Self-pay

## 2015-07-29 DIAGNOSIS — E785 Hyperlipidemia, unspecified: Secondary | ICD-10-CM

## 2015-07-29 DIAGNOSIS — D696 Thrombocytopenia, unspecified: Secondary | ICD-10-CM | POA: Diagnosis not present

## 2015-07-29 DIAGNOSIS — I10 Essential (primary) hypertension: Secondary | ICD-10-CM | POA: Diagnosis not present

## 2015-07-29 DIAGNOSIS — R079 Chest pain, unspecified: Secondary | ICD-10-CM

## 2015-07-29 DIAGNOSIS — R0789 Other chest pain: Secondary | ICD-10-CM | POA: Diagnosis not present

## 2015-07-29 LAB — TROPONIN I: Troponin I: <0.03 ng/mL (ref <0.031)

## 2015-07-29 LAB — CBC
HCT: 37.9 % (ref 36.0–46.0)
Hemoglobin: 12.2 g/dL (ref 12.0–15.0)
MCH: 32.5 pg (ref 26.0–34.0)
MCHC: 32.2 g/dL (ref 30.0–36.0)
MCV: 101.1 fL — AB (ref 78.0–100.0)
PLATELETS: 131 10*3/uL — AB (ref 150–400)
RBC: 3.75 MIL/uL — AB (ref 3.87–5.11)
RDW: 15 % (ref 11.5–15.5)
WBC: 6.2 10*3/uL (ref 4.0–10.5)

## 2015-07-29 LAB — MRSA PCR SCREENING: MRSA by PCR: NEGATIVE

## 2015-07-29 LAB — CREATININE, SERUM: Creatinine, Ser: 0.81 mg/dL (ref 0.44–1.00)

## 2015-07-29 MED ORDER — MORPHINE SULFATE (PF) 2 MG/ML IV SOLN
2.0000 mg | INTRAVENOUS | Status: DC | PRN
Start: 2015-07-29 — End: 2015-07-29
  Administered 2015-07-29: 2 mg via INTRAVENOUS
  Filled 2015-07-29: qty 1

## 2015-07-29 MED ORDER — ADULT MULTIVITAMIN W/MINERALS CH
1.0000 | ORAL_TABLET | Freq: Every day | ORAL | Status: DC
  Administered 2015-07-29: 1 via ORAL
  Filled 2015-07-29: qty 1

## 2015-07-29 MED ORDER — NITROGLYCERIN 0.4 MG SL SUBL: 0.4000 mg | SUBLINGUAL_TABLET | SUBLINGUAL | Status: DC | PRN

## 2015-07-29 MED ORDER — ACETAMINOPHEN 325 MG PO TABS
650.0000 mg | ORAL_TABLET | ORAL | Status: DC | PRN
  Administered 2015-07-29: 650 mg via ORAL
  Filled 2015-07-29: qty 2

## 2015-07-29 MED ORDER — ASPIRIN EC 325 MG PO TBEC
325.0000 mg | DELAYED_RELEASE_TABLET | Freq: Every day | ORAL | Status: DC
  Administered 2015-07-29: 325 mg via ORAL
  Filled 2015-07-29: qty 1

## 2015-07-29 MED ORDER — POLYVINYL ALCOHOL 1.4 % OP SOLN
1.0000 [drp] | Freq: Two times a day (BID) | OPHTHALMIC | Status: DC
  Administered 2015-07-29: 1 [drp] via OPHTHALMIC
  Filled 2015-07-29: qty 15

## 2015-07-29 MED ORDER — HYDRALAZINE HCL 20 MG/ML IJ SOLN: 10.0000 mg | INTRAMUSCULAR | Status: DC | PRN

## 2015-07-29 MED ORDER — ATENOLOL 25 MG PO TABS
25.0000 mg | ORAL_TABLET | Freq: Every day | ORAL | Status: DC
  Administered 2015-07-29: 25 mg via ORAL
  Filled 2015-07-29: qty 1

## 2015-07-29 MED ORDER — ONDANSETRON HCL 4 MG/2ML IJ SOLN: 4.0000 mg | Freq: Four times a day (QID) | INTRAMUSCULAR | Status: DC | PRN

## 2015-07-29 MED ORDER — OXCARBAZEPINE 150 MG PO TABS
150.0000 mg | ORAL_TABLET | Freq: Two times a day (BID) | ORAL | Status: DC
  Administered 2015-07-29 (×2): 150 mg via ORAL
  Filled 2015-07-29 (×3): qty 1

## 2015-07-29 MED ORDER — DOCUSATE SODIUM 100 MG PO CAPS: 200.0000 mg | ORAL_CAPSULE | Freq: Every day | ORAL | Status: DC

## 2015-07-29 MED ORDER — FLUOXETINE HCL 20 MG PO CAPS
20.0000 mg | ORAL_CAPSULE | Freq: Every day | ORAL | Status: DC
Start: 2015-07-29 — End: 2015-07-29
  Administered 2015-07-29: 20 mg via ORAL
  Filled 2015-07-29: qty 1

## 2015-07-29 MED ORDER — BACLOFEN 10 MG PO TABS
10.0000 mg | ORAL_TABLET | Freq: Three times a day (TID) | ORAL | Status: DC
  Administered 2015-07-29: 10 mg via ORAL
  Filled 2015-07-29: qty 1

## 2015-07-29 MED ORDER — ENOXAPARIN SODIUM 40 MG/0.4ML ~~LOC~~ SOLN: 40.0000 mg | Freq: Every day | SUBCUTANEOUS | Status: DC

## 2015-07-29 MED ORDER — LEVOTHYROXINE SODIUM 50 MCG PO TABS
175.0000 ug | ORAL_TABLET | Freq: Every day | ORAL | Status: DC
  Administered 2015-07-29: 175 ug via ORAL
  Filled 2015-07-29: qty 1

## 2015-07-29 MED ORDER — MECLIZINE HCL 25 MG PO TABS: 25.0000 mg | ORAL_TABLET | Freq: Three times a day (TID) | ORAL | Status: DC | PRN

## 2015-07-29 MED ORDER — LORATADINE 10 MG PO TABS
10.0000 mg | ORAL_TABLET | Freq: Every day | ORAL | Status: DC
Start: 2015-07-29 — End: 2015-07-29
  Administered 2015-07-29: 10 mg via ORAL
  Filled 2015-07-29: qty 1

## 2015-07-29 MED ORDER — PROMETHAZINE HCL 25 MG PO TABS: 25.0000 mg | ORAL_TABLET | Freq: Four times a day (QID) | ORAL | Status: DC | PRN

## 2015-07-29 MED ORDER — VITAMIN E 180 MG (400 UNIT) PO CAPS
800.0000 [IU] | ORAL_CAPSULE | Freq: Two times a day (BID) | ORAL | Status: DC
  Administered 2015-07-29 (×2): 800 [IU] via ORAL
  Filled 2015-07-29 (×3): qty 2

## 2015-07-29 MED ORDER — CALCIUM POLYCARBOPHIL 625 MG PO TABS
625.0000 mg | ORAL_TABLET | Freq: Every day | ORAL | Status: DC
Start: 2015-07-29 — End: 2015-07-29
  Administered 2015-07-29: 625 mg via ORAL
  Filled 2015-07-29: qty 1

## 2015-07-29 MED ORDER — VITAMIN D 1000 UNITS PO TABS
1000.0000 [IU] | ORAL_TABLET | Freq: Two times a day (BID) | ORAL | Status: DC
  Administered 2015-07-29: 1000 [IU] via ORAL
  Filled 2015-07-29: qty 1

## 2015-07-29 MED ORDER — ROPINIROLE HCL 0.5 MG PO TABS
0.5000 mg | ORAL_TABLET | Freq: Every day | ORAL | Status: DC
  Administered 2015-07-29: 0.5 mg via ORAL
  Filled 2015-07-29 (×2): qty 1

## 2015-07-29 MED ORDER — PANTOPRAZOLE SODIUM 40 MG PO TBEC
40.0000 mg | DELAYED_RELEASE_TABLET | Freq: Two times a day (BID) | ORAL | Status: DC
  Administered 2015-07-29: 40 mg via ORAL
  Filled 2015-07-29: qty 1

## 2015-07-29 MED ORDER — PANCRELIPASE (LIP-PROT-AMYL) 12000-38000 UNITS PO CPEP
24000.0000 [IU] | ORAL_CAPSULE | Freq: Three times a day (TID) | ORAL | Status: DC
  Administered 2015-07-29 (×2): 24000 [IU] via ORAL
  Filled 2015-07-29 (×4): qty 2

## 2015-07-29 MED ORDER — GABAPENTIN 400 MG PO CAPS
800.0000 mg | ORAL_CAPSULE | Freq: Three times a day (TID) | ORAL | Status: DC
  Administered 2015-07-29: 800 mg via ORAL
  Filled 2015-07-29: qty 2

## 2015-07-29 MED ORDER — OMEPRAZOLE MAGNESIUM 20 MG PO TBEC: 20.0000 mg | DELAYED_RELEASE_TABLET | Freq: Two times a day (BID) | ORAL | Status: DC

## 2015-07-29 MED ORDER — VITAMIN B-12 1000 MCG PO TABS
2000.0000 ug | ORAL_TABLET | Freq: Every day | ORAL | Status: DC
  Administered 2015-07-29: 2000 ug via ORAL
  Filled 2015-07-29: qty 2

## 2015-07-29 MED ORDER — PRAVASTATIN SODIUM 40 MG PO TABS: 40.0000 mg | ORAL_TABLET | Freq: Every day | ORAL | Status: DC

## 2015-07-29 NOTE — Progress Notes (Signed)
Reviewed discharge information with patient and caregiver. Answered all questions. Patient/caregiver able to teach back medications and reasons to contact MD/911. Patient verbalizes importance of PCP follow up appointment.   

## 2015-07-29 NOTE — Progress Notes (Signed)
  PROGRESS NOTE  [name] [id number] DOB: August 24, 1940 DOA: 07/28/2015 PCP: [name], MD  Summary: 75 year old woman presented to the emergency department with chest pain. Some improvement with Tums. Some radiation to the neck. No nausea, vomiting or shortness of breath. EKG nonacute. Admitted for chest pain evaluation.  Assessment/Plan: 1. Chest pain. Resolved. Troponins negative. EKG sinus rhythm, no acute abnormalities noted. Chest x-ray negative. Negative stress test October 2016 at [facility]. Consider GERD. Cardiology recommended no further evaluation. 2. Mild thrombocytopenia. Unclear etiology and significance. Appears stable. 3. Chronic abdominal pain secondary to pancreatic cancer, status post Whipple procedure. Reproducible epigastric pain on exam. 4. Chronic leg pain secondary to neuropathy.   Doing well, cardiology has recommended no further evaluation.  Home today.  F/u CBC for slightly low plts as an outpatient   Murray Hodgkins, MD  [facility]  Pager [phone] If 7PM-7AM, please contact night-coverage at [email or web address], password [facility] 07/29/2015, 9:25 AM    Consultants:  Cardiology   Procedures:  none  Antibiotics:  none  HPI/Subjective: Feels good, no chest pain. Breathing fine.  Objective: Filed Vitals:   07/28/15 2121 07/28/15 2343 07/29/15 0001 07/29/15 0609  BP: 193/82 179/83 146/68 152/72  Pulse: 62  78 69  Temp:   98.2 F (36.8 C) 98.1 F (36.7 C)  TempSrc:   Oral Oral  Resp: 18 17 18 17   Height:   5\' 1"  (1.549 m)   Weight:   72.2 kg (159 lb 2.8 oz)   SpO2: 98% 95% 100% 99%   No intake or output data in the 24 hours ending 07/29/15 0925   Filed Weights   07/29/15 0001  Weight: 72.2 kg (159 lb 2.8 oz)    Exam:    General:  Appears calm and comfortable Cardiovascular: RRR, no m/r/g. No LE edema. Telemetry: SR, no arrhythmias  Respiratory: CTA bilaterally, no w/r/r. Normal respiratory effort. Psychiatric:  grossly normal mood and affect, speech fluent and appropriate  New data reviewed:  troponins negative  Plts slightly low 131.  Scheduled Meds: . aspirin EC  325 mg Oral Daily  . atenolol  25 mg Oral Daily  . baclofen  10 mg Oral TID  . cholecalciferol  1,000 Units Oral BID  . docusate sodium  200 mg Oral QHS  . enoxaparin (LOVENOX) injection  40 mg Subcutaneous QHS  . FLUoxetine  20 mg Oral Daily  . gabapentin  800 mg Oral TID  . levothyroxine  175 mcg Oral QAC breakfast  . lipase/protease/amylase  24,000 Units Oral TID AC  . loratadine  10 mg Oral Daily  . multivitamin with minerals  1 tablet Oral Daily  . OXcarbazepine  150 mg Oral BID  . pantoprazole  40 mg Oral BID AC  . polycarbophil  625 mg Oral Daily  . polyvinyl alcohol  1 drop Both Eyes BID  . pravastatin  40 mg Oral q1800  . rOPINIRole  0.5 mg Oral QHS  . cyanocobalamin  2,000 mcg Oral Q1200  . vitamin E  800 Units Oral BID   Continuous Infusions:   Principal Problem:   Chest pain Active Problems:   Hypothyroidism   Essential hypertension   HLD (hyperlipidemia)

## 2015-07-29 NOTE — Consult Note (Signed)
Reason for Consult: chest pain   Referring Physician: Dr. Hal [name]   PCP:  [name] Reddish, MD  Primary Cardiologist:Dr. T. Lanell [name] [name] is an 75 y.o. female.    Chief Complaint:  Pt admitted 07/28/15 with central chest pain.  HPI: asked to see 75 year old woman on [name] with hx HTN, hyperlipidemia, MV prolapse, pancreatic cancer s/p pancreaticoduodenectomy and no evidence of recurrent disease.  Also hx syncope episodic since her teens.  Felt to be vasovagal or neurocardiogenic and she was orthostatic with 30 mmHg drop in systolic BP on visit.  She did undergo Lexiscan myoview with no ischemia and EF 56% 03/10/15.     Echo from 2013 with EF 55-60%G1dd. Mitral valve with mild regurg.  Pt states "I was leaving to go to [place] and developed mid sternal chest burning than continued"  She went to [name] and then to her PCP who sent her to ER.  She took tums with some improvement but the discomfort lasted 2-3 hours. No nausea, diaphoresis, or SOB.  No palpitations. By the time she was in ER burning had subsided.  She has had no discomfort since her troponins are negative and her EKG was without changes.     On admit EKG SR no changes from EKG 02/2015.  Troponins are negative.    Past [name] History  Diagnosis Date  . Osteoarthritis   . Hiatal hernia   . Chronic maxillary sinusitis     neti pot  . Depression     alone a lot  . Eustachian tube dysfunction   . Tardive dyskinesia     possibly reglan, vitamin E helps  . Mitral valve prolapse     antibiotics before dental procedures  . GERD (gastroesophageal reflux disease)   . Allergic rhinitis   . Glaucoma   . Hypothyroid   . Hypertension   . Heart murmur     hx. "MVP" -predental antibiotics  . Memory loss     short term memory loss  . Urgency of urination     some UTI in past  . Stroke [facility])     mini storkes left leg paraylsis. patient denies weakness 01/08/14.   . Cancer ([place]) 07/10/13   Pancreatic cancer with MRI scan 06-19-13    Past Surgical History  Procedure Laterality Date  . Knee surgery Left     x 5, total knee Left knee  . 1 baker cyst removed    . Dilation and curettage of uterus      x3  . Lumbar spine surgery      x2  . Abdominal hysterectomy      including ovaries  . Lumbar spine surgery      cyst  . Joint replacement      LTKA  . Blepharoplasty Bilateral     with cataract surgery  . Eye surgery Right     cataract  . Eus N/A 07/10/2013    Procedure: ESOPHAGEAL ENDOSCOPIC ULTRASOUND (EUS) RADIAL;  Surgeon: [name] Silence, MD;  Location: [name];  Service: [name];  Laterality: N/A;  . Fine needle aspiration N/A 07/10/2013    Procedure: FINE NEEDLE ASPIRATION (FNA) LINEAR;  Surgeon: [name] Silence, MD;  Location: [name];  Service: [name];  Laterality: N/A;  possible fna  . Back surgery      fusion  . Breast surgery      Biopsy left 2 times  . Colonoscopy w/ polypectomy  2004 last colonoscopy, no polyps  . Laparoscopy N/A 08/07/2013    Procedure: LAPAROSCOPY DIAGNOSTIC;  Surgeon: [name] Klein, MD;  Location: [place];  Service: General;  Laterality: N/A;  . Whipple procedure N/A 08/07/2013    Procedure: WHIPPLE PROCEDURE;  Surgeon: [name] Klein, MD;  Location: [place];  Service: General;  Laterality: N/A;  . Esophagogastroduodenoscopy N/A 09/11/2013    Procedure: ESOPHAGOGASTRODUODENOSCOPY (EGD);  Surgeon: [name] Nipper, MD;  Location: [name];  Service: [name];  Laterality: N/A;  Moderate sedation okay if MAC not available    Family History  Problem Relation Age of Onset  . Cancer Mother     Breast Cancer with Metastatic disease  . Alzheimer's disease Father   . Other Brother 17    GSW   Social History:  reports that she has never smoked. She has never used smokeless tobacco. She reports that she does not drink alcohol or use illicit drugs.  Allergies:  Allergies  Allergen Reactions  . Other Other (See Comments)     According to patient Trilafor and Reglan cause Tardive Dyskinesia  . Metoclopramide Hcl Other (See Comments)    Shaking; caused tardive dyskinesia  . Niacin Other (See Comments)    shaking  . Trovan [Alatrofloxacin Mesylate] Other (See Comments)    Caused shaking and nervousness  . Nortriptyline Other (See Comments)    Dizziness   . Benzocaine-Resorcinol Rash  . Celecoxib Other (See Comments)    unknown  . Glucosamine Other (See Comments)    unknown  . Phenazopyridine Hcl Other (See Comments)    unknown  . Sulfa Antibiotics Nausea And Vomiting and Rash  . Sulfonamide Derivatives Nausea And Vomiting and Rash    OUTPATIENT MEDICATIONS: No current facility-administered medications on file prior to encounter.   Current Outpatient Prescriptions on File Prior to Encounter  Medication Sig Dispense Refill  . aspirin 81 MG tablet Take 81 mg by mouth at bedtime.     Marland Kitchen atenolol (TENORMIN) 25 MG tablet Take 1 tablet by mouth  every morning 90 tablet 3  . baclofen (LIORESAL) 10 MG tablet Take 1 tablet (10 mg total) by mouth 3 (three) times daily. 90 each 6  . carboxymethylcellulose (REFRESH TEARS) 0.5 % SOLN Place 1 drop into both eyes 2 (two) times daily.     . cholecalciferol (VITAMIN D) 1000 UNITS tablet Take 1,000 Units by mouth 2 (two) times daily.     . cyanocobalamin 2000 MCG tablet Take 2,000 mcg by mouth daily at 12 noon.     . docusate sodium (COLACE) 100 MG capsule Take 200 mg by mouth at bedtime.    . feeding supplement (BOOST HIGH PROTEIN) LIQD Take 1 Container by mouth daily.    Marland Kitchen FLUoxetine (PROZAC) 20 MG capsule Take 1 capsule (20 mg total) by mouth daily. 90 capsule 2  . gabapentin (NEURONTIN) 400 MG capsule 2 caps 3 times daily and 3 at hs 270 capsule 6  . hydrocortisone cream 1 % Apply 1 application topically 2 (two) times daily. For rectal cyst    . levothyroxine (SYNTHROID, LEVOTHROID) 175 MCG tablet Take 1 tablet by mouth  daily before breakfast 90 tablet 2  .  lipase/protease/amylase (CREON) 12000 UNITS CPEP capsule Take 2 capsules (24,000 Units total) by mouth 3 (three) times daily before meals. 270 capsule 5  . loratadine (CLARITIN) 10 MG tablet Take 10 mg by mouth daily.    Marland Kitchen lovastatin (MEVACOR) 40 MG tablet Take 1 tablet by mouth at  bedtime  90 tablet 2  . Multiple Vitamin (MULTIVITAMIN WITH MINERALS) TABS tablet Take 1 tablet by mouth daily.    Marland Kitchen omeprazole (PRILOSEC OTC) 20 MG tablet Take 20 mg by mouth 2 (two) times daily.     . OXcarbazepine (TRILEPTAL) 150 MG tablet TAKE 1/2 TABLET TWICE A DAY FOR 1 WEEK THEN 1 TABLET TWICE A DAY 60 tablet 6  . polycarbophil (FIBERCON) 625 MG tablet Take 625 mg by mouth daily.     Marland Kitchen rOPINIRole (REQUIP) 0.5 MG tablet Take 1 tablet (0.5 mg total) by mouth at bedtime. 30 tablet 6  . Simethicone 180 MG CAPS Take 180 mg by mouth 4 (four) times daily.     Marland Kitchen UNABLE TO FIND Apply 1 application topically 4 (four) times daily. Doxep/Amanta/DXM/Lidocaine 25 mg for her feet    . vitamin E 400 UNIT capsule Take 800 Units by mouth 2 (two) times daily. For tartive dyskinesia    . acetaminophen (TYLENOL) 325 MG tablet Take 650 mg by mouth every 6 (six) hours as needed for mild pain, moderate pain or fever.    Marland Kitchen aluminum & magnesium hydroxide-simethicone (MYLANTA) 500-450-40 MG/5ML suspension Take 5 mLs by mouth every 6 (six) hours as needed for indigestion.    . meclizine (ANTIVERT) 25 MG tablet Take 25 mg by mouth 3 (three) times daily as needed for dizziness.    . promethazine (PHENERGAN) 25 MG tablet Take 25 mg by mouth every 6 (six) hours as needed for nausea or vomiting.     CURRENT MEDICATIONS: Scheduled Meds: . aspirin EC  325 mg Oral Daily  . atenolol  25 mg Oral Daily  . baclofen  10 mg Oral TID  . cholecalciferol  1,000 Units Oral BID  . docusate sodium  200 mg Oral QHS  . enoxaparin (LOVENOX) injection  40 mg Subcutaneous QHS  . FLUoxetine  20 mg Oral Daily  . gabapentin  800 mg Oral TID  . levothyroxine   175 mcg Oral QAC breakfast  . lipase/protease/amylase  24,000 Units Oral TID AC  . loratadine  10 mg Oral Daily  . multivitamin with minerals  1 tablet Oral Daily  . OXcarbazepine  150 mg Oral BID  . pantoprazole  40 mg Oral BID AC  . polycarbophil  625 mg Oral Daily  . polyvinyl alcohol  1 drop Both Eyes BID  . pravastatin  40 mg Oral q1800  . rOPINIRole  0.5 mg Oral QHS  . cyanocobalamin  2,000 mcg Oral Q1200  . vitamin E  800 Units Oral BID   Continuous Infusions:  PRN Meds:.acetaminophen, hydrALAZINE, meclizine, morphine injection, nitroGLYCERIN, ondansetron (ZOFRAN) IV, promethazine   Results for orders placed or performed during the hospital encounter of 07/28/15 (from the past 48 hour(s))  Basic metabolic panel     Status: None   Collection Time: 07/28/15  5:27 PM  Result Value Ref Range   Sodium 142 135 - 145 mmol/L   Potassium 3.8 3.5 - 5.1 mmol/L   Chloride 103 101 - 111 mmol/L   CO2 30 22 - 32 mmol/L   Glucose, Bld 92 65 - 99 mg/dL   BUN 10 6 - 20 mg/dL   Creatinine, Ser 0.76 0.44 - 1.00 mg/dL   Calcium 9.0 8.9 - 10.3 mg/dL   GFR calc non Af Amer >60 >60 mL/min   GFR calc Af Amer >60 >60 mL/min    Comment: (NOTE) The eGFR has been calculated using the CKD EPI equation. This calculation has not been  validated in all clinical situations. eGFR's persistently <60 mL/min signify possible Chronic Kidney Disease.    Anion gap 9 5 - 15  CBC     Status: Abnormal   Collection Time: 07/28/15  5:27 PM  Result Value Ref Range   WBC 5.2 4.0 - 10.5 K/uL   RBC 3.80 (L) 3.87 - 5.11 MIL/uL   Hemoglobin 12.3 12.0 - 15.0 g/dL   HCT 37.7 36.0 - 46.0 %   MCV 99.2 78.0 - 100.0 fL   MCH 32.4 26.0 - 34.0 pg   MCHC 32.6 30.0 - 36.0 g/dL   RDW 14.6 11.5 - 15.5 %   Platelets 143 (L) 150 - 400 K/uL  Hepatic function panel     Status: None   Collection Time: 07/28/15  5:27 PM  Result Value Ref Range   Total Protein 7.3 6.5 - 8.1 g/dL   Albumin 4.4 3.5 - 5.0 g/dL   AST 26 15 - 41  U/L   ALT 28 14 - 54 U/L   Alkaline Phosphatase 111 38 - 126 U/L   Total Bilirubin 0.6 0.3 - 1.2 mg/dL   Bilirubin, Direct 0.1 0.1 - 0.5 mg/dL   Indirect Bilirubin 0.5 0.3 - 0.9 mg/dL  Lipase, blood     Status: None   Collection Time: 07/28/15  5:27 PM  Result Value Ref Range   Lipase 15 11 - 51 U/L  I-stat troponin, ED (not at [name], [facility])     Status: None   Collection Time: 07/28/15  5:44 PM  Result Value Ref Range   Troponin i, poc 0.00 0.00 - 0.08 ng/mL   Comment 3            Comment: Due to the release kinetics of cTnI, a negative result within the first hours of the onset of symptoms does not rule out myocardial infarction with certainty. If myocardial infarction is still suspected, repeat the test at appropriate intervals.   Troponin I (q 6hr x 3)     Status: None   Collection Time: 07/29/15 12:50 AM  Result Value Ref Range   Troponin I <0.03 <0.031 ng/mL    Comment:        NO INDICATION OF MYOCARDIAL INJURY.   CBC     Status: Abnormal   Collection Time: 07/29/15 12:50 AM  Result Value Ref Range   WBC 6.2 4.0 - 10.5 K/uL   RBC 3.75 (L) 3.87 - 5.11 MIL/uL   Hemoglobin 12.2 12.0 - 15.0 g/dL   HCT 37.9 36.0 - 46.0 %   MCV 101.1 (H) 78.0 - 100.0 fL   MCH 32.5 26.0 - 34.0 pg   MCHC 32.2 30.0 - 36.0 g/dL   RDW 15.0 11.5 - 15.5 %   Platelets 131 (L) 150 - 400 K/uL  Creatinine, serum     Status: None   Collection Time: 07/29/15 12:50 AM  Result Value Ref Range   Creatinine, Ser 0.81 0.44 - 1.00 mg/dL   GFR calc non Af Amer >60 >60 mL/min   GFR calc Af Amer >60 >60 mL/min    Comment: (NOTE) The eGFR has been calculated using the CKD EPI equation. This calculation has not been validated in all clinical situations. eGFR's persistently <60 mL/min signify possible Chronic Kidney Disease.   Troponin I (q 6hr x 3)     Status: None   Collection Time: 07/29/15  5:21 AM  Result Value Ref Range   Troponin I <0.03 <0.031 ng/mL    Comment:  NO INDICATION  OF MYOCARDIAL INJURY.    Dg Chest 2 View  07/28/2015  CLINICAL DATA:  Central chest pain since this morning radiating to left polyp. Dizziness. EXAM: CHEST  2 VIEW COMPARISON:  09/02/2013 FINDINGS: The heart size and mediastinal contours are within normal limits. Both lungs are clear. Elevation of right hemidiaphragm again noted. Several mild mid thoracic spine vertebral body compression fracture deformities are noted. IMPRESSION: Stable elevation of right hemidiaphragm. No active cardiopulmonary disease. Electronically Signed   By: Earle Gell M.D.   On: 07/28/2015 17:22    ROS: General:no colds or fevers, no weight changes Skin:no rashes or ulcers HEENT:no blurred vision, no congestion CV:see HPI PUL:see HPI GI:no diarrhea constipation or melena, no indigestion GU:no hematuria, no dysuria MS:no joint pain, no claudication Neuro:no syncope, no lightheadedness Endo:no diabetes, no thyroid disease   Blood pressure 152/72, pulse 69, temperature 98.1 F (36.7 C), temperature source Oral, resp. rate 17, height 5' 1" (1.549 m), weight 159 lb 2.8 oz (72.2 kg), SpO2 99 %.  Wt Readings from Last 3 Encounters:  07/29/15 159 lb 2.8 oz (72.2 kg)  05/11/15 152 lb (68.947 kg)  03/10/15 150 lb (68.04 kg)    PE: General:Pleasant affect, NAD Skin:Warm and dry, brisk capillary refill HEENT:normocephalic, sclera clear, mucus membranes moist Neck:supple, no JVD, no bruits  Heart:S1S2 RRR without murmur, gallup, rub or click Lungs:clear without rales, rhonchi, or wheezes DVV:OHYW, non tender, + BS, do not palpate liver spleen or masses Ext:no lower ext edema, 2+ pedal pulses, 2+ radial pulses- Lt knee with swelling Neuro:alert and oriented X 3, MAE, follows commands, + facial symmetry Tele: SR   Assessment/Plan Principal Problem:   Chest pain Active Problems:   Hypothyroidism   Essential hypertension   HLD (hyperlipidemia)  1. Chest pain- negative troponin despite 2-3 hours of burning pain  no EKG changes.  With recent myoview about 4 months ago that was low risk, no ischemia we would do no further work up,  This may have been GERD with some relief with TUMS and her GI hx.    Please have pt follow up in 2-4 weeks with [name].  Will give pt diet.  Discussed with Dr. Marlou Porch.  2. HTN elevated initially per IM  2. Hx of syncope thought to be orthostatic   [facility]  Nurse Practitioner Certified [place] Pager [phone] or after 5pm or weekends call [phone] 07/29/2015, 7:47 AM     Discussed plan over phone. Agree. Recent NUC stress reassuring. Likely GI related discomfort. Patient has left the hospital prior to me being able to see her.   Candee Furbish, MD

## 2015-07-29 NOTE — Discharge Summary (Signed)
Physician Discharge Summary  [name] [id number] DOB: 09-Jul-1940 DOA: 07/28/2015  PCP: [name] Reddish, MD  Admit date: 07/28/2015 Discharge date: 07/29/2015  Recommendations for Outpatient Follow-up:  1. Modest thrombocytopenia of unclear significance   Follow-up Information    Follow up with [name] Reddish, MD.   Specialty:  Family Medicine   Why:  As needed   Contact information:   [place] [address] [phone]      Discharge Diagnoses:  1. Atypical chest pain 2. GERD 3. Thrombocytopenia   Discharge Condition: improved Disposition: home  Diet recommendation: regular  Filed Weights   07/29/15 0001  Weight: 72.2 kg (159 lb 2.8 oz)    History of present illness:  75 year old woman presented to the emergency department with chest pain. Some improvement with Tums. Some radiation to the neck. No nausea, vomiting or shortness of breath. EKG nonacute. Admitted for chest pain evaluation.  Hospital Course:  [name] was observed overnight, no recurrent pain. Workup unrevealing. Seen by cardiology and cleared for discharge. Hospitalization was uncomplicated. Individual issues as below.  1. Chest pain. Resolved. Troponins negative. EKG sinus rhythm, no acute abnormalities noted. Chest x-ray negative. Negative stress test October 2016 at [facility]. Consider GERD. Cardiology recommended no further evaluation. 2. Mild thrombocytopenia. Unclear etiology and significance. Likely insignificant. Consider repeat CBC as an outpatient. 3. Chronic abdominal pain secondary to pancreatic cancer, status post Whipple procedure. Reproducible epigastric pain on exam. 4. Chronic leg pain secondary to neuropathy.  Consultants:  Cardiology  Procedures:  none  Antibiotics:  None  Discharge Instructions  Discharge Instructions    Activity as tolerated - No restrictions    Complete by:  As directed      Diet - low sodium heart healthy     Complete by:  As directed      Discharge instructions    Complete by:  As directed   Call your physician or seek immediate medical attention for chest pain, shortness of breath or worsening of condition.          Current Discharge Medication List    CONTINUE these medications which have NOT CHANGED   Details  acetaminophen-codeine (TYLENOL #3) 300-30 MG tablet Take 1 tablet by mouth every 4 (four) hours as needed for moderate pain or severe pain.    aspirin 81 MG tablet Take 81 mg by mouth at bedtime.     atenolol (TENORMIN) 25 MG tablet Take 1 tablet by mouth  every morning Qty: 90 tablet, Refills: 3    baclofen (LIORESAL) 10 MG tablet Take 1 tablet (10 mg total) by mouth 3 (three) times daily. Qty: 90 each, Refills: 6    carboxymethylcellulose (REFRESH TEARS) 0.5 % SOLN Place 1 drop into both eyes 2 (two) times daily.     cholecalciferol (VITAMIN D) 1000 UNITS tablet Take 1,000 Units by mouth 2 (two) times daily.     cyanocobalamin 2000 MCG tablet Take 2,000 mcg by mouth daily at 12 noon.     docusate sodium (COLACE) 100 MG capsule Take 200 mg by mouth at bedtime.    feeding supplement (BOOST HIGH PROTEIN) LIQD Take 1 Container by mouth daily.    FLUoxetine (PROZAC) 20 MG capsule Take 1 capsule (20 mg total) by mouth daily. Qty: 90 capsule, Refills: 2    gabapentin (NEURONTIN) 400 MG capsule 2 caps 3 times daily and 3 at hs Qty: 270 capsule, Refills: 6    hydrocortisone cream 1 % Apply 1 application topically  2 (two) times daily. For rectal cyst    levothyroxine (SYNTHROID, LEVOTHROID) 175 MCG tablet Take 1 tablet by mouth  daily before breakfast Qty: 90 tablet, Refills: 2    lipase/protease/amylase (CREON) 12000 UNITS CPEP capsule Take 2 capsules (24,000 Units total) by mouth 3 (three) times daily before meals. Qty: 270 capsule, Refills: 5   Associated Diagnoses: Exocrine pancreatic insufficiency (HCC)    loratadine (CLARITIN) 10 MG tablet Take 10 mg by mouth daily.      lovastatin (MEVACOR) 40 MG tablet Take 1 tablet by mouth at  bedtime Qty: 90 tablet, Refills: 2    Multiple Vitamin (MULTIVITAMIN WITH MINERALS) TABS tablet Take 1 tablet by mouth daily.    omeprazole (PRILOSEC OTC) 20 MG tablet Take 20 mg by mouth 2 (two) times daily.     OXcarbazepine (TRILEPTAL) 150 MG tablet TAKE 1/2 TABLET TWICE A DAY FOR 1 WEEK THEN 1 TABLET TWICE A DAY Qty: 60 tablet, Refills: 6    polycarbophil (FIBERCON) 625 MG tablet Take 625 mg by mouth daily.     rOPINIRole (REQUIP) 0.5 MG tablet Take 1 tablet (0.5 mg total) by mouth at bedtime. Qty: 30 tablet, Refills: 6    Simethicone 180 MG CAPS Take 180 mg by mouth 4 (four) times daily.     vitamin E 400 UNIT capsule Take 800 Units by mouth 2 (two) times daily. For tartive dyskinesia    acetaminophen (TYLENOL) 325 MG tablet Take 650 mg by mouth every 6 (six) hours as needed for mild pain, moderate pain or fever.    aluminum & magnesium hydroxide-simethicone (MYLANTA) 500-450-40 MG/5ML suspension Take 5 mLs by mouth every 6 (six) hours as needed for indigestion.    meclizine (ANTIVERT) 25 MG tablet Take 25 mg by mouth 3 (three) times daily as needed for dizziness.    promethazine (PHENERGAN) 25 MG tablet Take 25 mg by mouth every 6 (six) hours as needed for nausea or vomiting.      STOP taking these medications     UNABLE TO FIND        Allergies  Allergen Reactions  . Other Other (See Comments)    According to patient Trilafor and Reglan cause Tardive Dyskinesia  . Metoclopramide Hcl Other (See Comments)    Shaking; caused tardive dyskinesia  . Niacin Other (See Comments)    shaking  . Trovan [Alatrofloxacin Mesylate] Other (See Comments)    Caused shaking and nervousness  . Nortriptyline Other (See Comments)    Dizziness   . Benzocaine-Resorcinol Rash  . Celecoxib Other (See Comments)    unknown  . Glucosamine Other (See Comments)    unknown  . Phenazopyridine Hcl Other (See Comments)     unknown  . Sulfa Antibiotics Nausea And Vomiting and Rash  . Sulfonamide Derivatives Nausea And Vomiting and Rash    The results of significant diagnostics from this hospitalization (including imaging, microbiology, ancillary and laboratory) are listed below for reference.    Significant Diagnostic Studies: Dg Chest 2 View  07/28/2015  CLINICAL DATA:  Central chest pain since this morning radiating to left polyp. Dizziness. EXAM: CHEST  2 VIEW COMPARISON:  09/02/2013 FINDINGS: The heart size and mediastinal contours are within normal limits. Both lungs are clear. Elevation of right hemidiaphragm again noted. Several mild mid thoracic spine vertebral body compression fracture deformities are noted. IMPRESSION: Stable elevation of right hemidiaphragm. No active cardiopulmonary disease. Electronically Signed   By: Earle Gell M.D.   On: 07/28/2015 17:22  Microbiology: Recent Results (from the past 240 hour(s))  MRSA PCR Screening     Status: None   Collection Time: 07/29/15  8:01 AM  Result Value Ref Range Status   MRSA by PCR NEGATIVE NEGATIVE Final    Comment:        The GeneXpert MRSA Assay (FDA approved for NASAL specimens only), is one component of a comprehensive MRSA colonization surveillance program. It is not intended to diagnose MRSA infection nor to guide or monitor treatment for MRSA infections.      Labs: Basic Metabolic Panel:  Recent Labs Lab 07/28/15 1727 07/29/15 0050  NA 142  --   K 3.8  --   CL 103  --   CO2 30  --   GLUCOSE 92  --   BUN 10  --   CREATININE 0.76 0.81  CALCIUM 9.0  --    Liver Function Tests:  Recent Labs Lab 07/28/15 1727  AST 26  ALT 28  ALKPHOS 111  BILITOT 0.6  PROT 7.3  ALBUMIN 4.4    Recent Labs Lab 07/28/15 1727  LIPASE 15   CBC:  Recent Labs Lab 07/28/15 1727 07/29/15 0050  WBC 5.2 6.2  HGB 12.3 12.2  HCT 37.7 37.9  MCV 99.2 101.1*  PLT 143* 131*   Cardiac Enzymes:  Recent Labs Lab 07/29/15 0050  07/29/15 0521 07/29/15 1141  TROPONINI <0.03 <0.03 <0.03      Principal Problem:   Chest pain Active Problems:   Hypothyroidism   Essential hypertension   HLD (hyperlipidemia)   Time coordinating discharge: 25 minutes  Signed:  Murray Hodgkins, MD [facility] 07/29/2015, 2:00 PM

## 2015-07-30 ENCOUNTER — Telehealth: Payer: Self-pay | Admitting: Cardiovascular Disease

## 2015-07-30 NOTE — Telephone Encounter (Signed)
Spoke with pt, she was seen recently in the ER for chest pain.  She is calling because of swelling in her feet and legs she has had for several weeks. She denies SOB. She has had more chest pain but it is relieved with tums. She requested a sooner appt to see dr Oval Linsey. Follow up scheduled

## 2015-07-30 NOTE — Telephone Encounter (Signed)
Mrs. [name] states that her legs are swollen and its hard her to get up and down . Please call .   Thanks

## 2015-07-31 ENCOUNTER — Other Ambulatory Visit: Payer: Self-pay | Admitting: Family Medicine

## 2015-07-31 ENCOUNTER — Ambulatory Visit (INDEPENDENT_AMBULATORY_CARE_PROVIDER_SITE_OTHER): Payer: Medicare Other | Admitting: Family Medicine

## 2015-07-31 ENCOUNTER — Encounter: Payer: Self-pay | Admitting: Family Medicine

## 2015-07-31 VITALS — BP 160/94 | HR 84 | Temp 98.5°F | Wt 162.0 lb

## 2015-07-31 DIAGNOSIS — Z0001 Encounter for general adult medical examination with abnormal findings: Secondary | ICD-10-CM

## 2015-07-31 DIAGNOSIS — Z0000 Encounter for general adult medical examination without abnormal findings: Secondary | ICD-10-CM | POA: Diagnosis not present

## 2015-07-31 DIAGNOSIS — R6889 Other general symptoms and signs: Secondary | ICD-10-CM | POA: Diagnosis not present

## 2015-07-31 DIAGNOSIS — R079 Chest pain, unspecified: Secondary | ICD-10-CM | POA: Diagnosis not present

## 2015-07-31 DIAGNOSIS — G609 Hereditary and idiopathic neuropathy, unspecified: Secondary | ICD-10-CM | POA: Diagnosis not present

## 2015-07-31 DIAGNOSIS — M545 Low back pain: Secondary | ICD-10-CM | POA: Diagnosis not present

## 2015-07-31 NOTE — Assessment & Plan Note (Signed)
S: continues to complain of 10/10 pain in her legs. Morphine in hospital only took the edge off. Also having pain from knee effusion followed by orthopedics. Taking tylenol #3 which only helps slightly.  A/P: Discussed patient was unlikely to ever have resolution of pain. This has been discussed with neurology as well. Given such high dose of gabapentin and not sure if helping we opted to try to reduce dose to 2 of 400mg  pills TID instead of 1200mg  at nighttime dose. Continue sparing use of tylenol #3 mainly at night to help her be able to sleep. Follow up 4 weeks- we will also recheck BP at that time, may be up some today due to pain level (tougher day for her though she does not appear in distress)

## 2015-07-31 NOTE — Progress Notes (Signed)
In addition to AWV, patient requests hospital follow up and has other concerns she wants to discuss S: ROS- no shortness of breath, current chest pain, nausea, vomiting O: Gen: NAD, resting comfortably in chair CV: RRR no mrg , no chest wall pain Lungs: CTAB  Abd: soft/nontender/nondistended/normal bowel sounds  MSK: moves all extremities, 1+ edema on left and trace on right  Skin: warm and dry, no rash  Neuro: CN II-XII intact, sensation and reflexes normal throughout, 5/5 muscle strength in bilateral upper and lower extremities. Normal finger to nose. Normal rapid alternating movements.   Hereditary and idiopathic peripheral neuropathy S: continues to complain of 10/10 pain in her legs. Morphine in hospital only took the edge off. Also having pain from knee effusion followed by orthopedics. Taking tylenol #3 which only helps slightly.  A/P: Discussed patient was unlikely to ever have resolution of pain. This has been discussed with neurology as well. Given such high dose of gabapentin and not sure if helping we opted to try to reduce dose to 2 of 400mg  pills TID instead of 1200mg  at nighttime dose. Continue sparing use of tylenol #3 mainly at night to help her be able to sleep. Follow up 4 weeks- we will also recheck BP at that time, may be up some today due to pain level (tougher day for her though she does not appear in distress)   Lumbago S: complains of low back pain since getting out of the hospital. Pain is diffuse over low back. Tylenol #3 helps some A/P: did have some compression fractures noted on CXR in thoracic spine- could be similar findings in lumbar spine. We will get a bone density- she is going to do this when gets mammogram- suspect she may need fosamax.    Central chest pain S: Patient had an overnight hospitalization for chest pain last week. She presented with chest pain radiating to left neck and with risk factors was hospitalized. Tropnonins negative cycled. CXR  negative for acute cardiac or pulmonary disease. She had some relief with GI cocktail and GERD thought likely. Cardiology consulted and given [facility] low risk stress test October 2016 no further cardiac workup was warranted. Since being home has not had recurrent pain other than night she arrived home and that improved with tums. Already on BID prilosec 20mg  A/P: continue BID prilosec 20mg  for reflux as likely cause of chest pain. May use tums prn. Considered alternate agent such as protonix or nexium but has done well since discharge- will monitor. If has persistent issues, would have to consider EGD and GI follow up. Also has cardiology follow up next week

## 2015-07-31 NOTE — Assessment & Plan Note (Addendum)
S: Patient had an overnight hospitalization for chest pain last week. She presented with chest pain radiating to left neck and with risk factors was hospitalized. Tropnonins negative cycled. CXR negative for acute cardiac or pulmonary disease. She had some relief with GI cocktail and GERD thought likely. Cardiology consulted and given [facility] low risk stress test October 2016 no further cardiac workup was warranted. Since being home has not had recurrent pain other than night she arrived home and that improved with tums. Already on BID prilosec 20mg  A/P: continue BID prilosec 20mg  for reflux as likely cause of chest pain. May use tums prn. Considered alternate agent such as protonix or nexium but has done well since discharge- will monitor. If has persistent issues, would have to consider EGD and GI follow up. Also has cardiology follow up next week

## 2015-07-31 NOTE — Progress Notes (Signed)
[name] Reddish, MD Phone: [phone]  Subjective:  Patient presents today for their annual wellness visit.    Preventive Screening-Counseling & Management  Smoking Status: Never Smoker Second Hand Smoking status: No smokers in home  Risk Factors Regular exercise: no Diet: reasonable  Fall Risk: yes, walks with cane to reduce risk   Cardiac risk factors:  advanced age (older than 44 for men, 34 for women)  Hyperlipidemia yes No diabetes.    Depression Screen None. PHQ2 0   Activities of Daily Living Independent ADLs and IADLs   Hearing Difficulties: -patient endorses- advised [employer]  Cognitive Testing No reported worsening. Has had stable mild memory loss for 10-15 years.   Normal 3 word recall  List the Names of Other Physician/Practitioners you currently use: Dr. [name] surgery Dr. [name] oncoogy Dr. Oval Linsey cardiology Dr. Matthew Saras GYN Dr. Lorin Mercy orthopedist Dr. Gaynelle Arabian urology Dr. Charolotte Capuchin Dr. Zollie Beckers psychiatry Dr. Krista Blue neurology  Immunization History  Administered Date(s) Administered  . Influenza Whole 02/22/2008, 03/31/2009, 04/06/2010, 04/13/2012  . Influenza,inj,Quad PF,36+ Mos 04/16/2014, 02/12/2015  . Pneumococcal Conjugate-13 09/02/2014  . Pneumococcal Polysaccharide-23 06/09/2008   Required Immunizations needed today  Health Maintenance Due  Topic Date Due  . TETANUS/TDAP -insurance does not cover 08/29/1958  . ZOSTAVAX - patient to check with insurance 08/29/1999   Screening tests- up to date Health Maintenance Due  Topic Date Due  . TETANUS/TDAP  08/29/1958  . ZOSTAVAX  08/29/1999   ROS- No pertinent positives discovered in course of AWV  The following were reviewed and entered/updated in epic: Past Medical History  Diagnosis Date  . Osteoarthritis   . Hiatal hernia   . Chronic maxillary sinusitis     neti pot  . Depression     alone a lot  . Eustachian tube dysfunction   . Tardive dyskinesia     possibly  reglan, vitamin E helps  . Mitral valve prolapse     antibiotics before dental procedures  . GERD (gastroesophageal reflux disease)   . Allergic rhinitis   . Glaucoma   . Hypothyroid   . Hypertension   . Heart murmur     hx. "MVP" -predental antibiotics  . Memory loss     short term memory loss  . Urgency of urination     some UTI in past  . Stroke [facility])     mini storkes left leg paraylsis. patient denies weakness 01/08/14.   . Cancer ([place]) 07/10/13    Pancreatic cancer with MRI scan 06-19-13   Patient Active Problem List   Diagnosis Date Noted  . Exocrine pancreatic insufficiency ([place]) 12/30/2013    Priority: High  . Carcinoma of head of pancreas ([place]) 07/01/2013    Priority: High  . MITRAL VALVE PROLAPSE 02/26/2007    Priority: High  . Fatty liver 09/09/2014    Priority: Medium  . Memory loss 01/08/2014    Priority: Medium  . Other abnormal glucose 11/29/2013    Priority: Medium  . Hereditary and idiopathic peripheral neuropathy 08/30/2012    Priority: Medium  . Paresthesia of foot 06/06/2012    Priority: Medium  . Syncope 09/26/2011    Priority: Medium  . Hypothyroidism 02/26/2007    Priority: Medium  . HYPERLIPIDEMIA 02/26/2007    Priority: Medium  . ANXIETY 02/26/2007    Priority: Medium  . DEPRESSION 02/26/2007    Priority: Medium  . Essential hypertension 02/26/2007    Priority: Medium  . OSTEOPOROSIS 02/26/2007    Priority: Medium  . Abnormality of  gait 03/27/2014    Priority: Low  . RHINOSINUSITIS, CHRONIC 02/03/2009    Priority: Low  . COLONIC POLYPS 06/05/2008    Priority: Low  . OSTEOARTHRITIS 06/04/2008    Priority: Low  . DIARRHEA, CHRONIC 04/22/2008    Priority: Low  . Low back pain 08/03/2007    Priority: Low  . Chronic maxillary sinusitis 04/20/2007    Priority: Low  . TARDIVE DYSKINESIA 02/26/2007    Priority: Low  . GLAUCOMA 02/26/2007    Priority: Low  . EUSTACHIAN TUBE DYSFUNCTION 02/26/2007    Priority: Low  . ALLERGIC RHINITIS  02/26/2007    Priority: Low  . GERD 02/26/2007    Priority: Low  . HIATAL HERNIA 02/26/2007    Priority: Low  . Central chest pain 07/28/2015  . Chest pain 07/28/2015  . HLD (hyperlipidemia) 07/28/2015  . Paresthesia 07/07/2014  . Lumbago 07/07/2014   Past Surgical History  Procedure Laterality Date  . Knee surgery Left     x 5, total knee Left knee  . 1 baker cyst removed    . Dilation and curettage of uterus      x3  . Lumbar spine surgery      x2  . Abdominal hysterectomy      including ovaries  . Lumbar spine surgery      cyst  . Joint replacement      LTKA  . Blepharoplasty Bilateral     with cataract surgery  . Eye surgery Right     cataract  . Eus N/A 07/10/2013    Procedure: ESOPHAGEAL ENDOSCOPIC ULTRASOUND (EUS) RADIAL;  Surgeon: Arta Silence, MD;  Location: [facility] ENDOSCOPY;  Service: Endoscopy;  Laterality: N/A;  . Fine needle aspiration N/A 07/10/2013    Procedure: FINE NEEDLE ASPIRATION (FNA) LINEAR;  Surgeon: Arta Silence, MD;  Location: [facility] ENDOSCOPY;  Service: Endoscopy;  Laterality: N/A;  possible fna  . Back surgery      fusion  . Breast surgery      Biopsy left 2 times  . Colonoscopy w/ polypectomy      2004 last colonoscopy, no polyps  . Laparoscopy N/A 08/07/2013    Procedure: LAPAROSCOPY DIAGNOSTIC;  Surgeon: [name], MD;  Location: [place];  Service: General;  Laterality: N/A;  . Whipple procedure N/A 08/07/2013    Procedure: WHIPPLE PROCEDURE;  Surgeon: [name], MD;  Location: [place];  Service: General;  Laterality: N/A;  . Esophagogastroduodenoscopy N/A 09/11/2013    Procedure: ESOPHAGOGASTRODUODENOSCOPY (EGD);  Surgeon: Cleotis Nipper, MD;  Location: [facility] ENDOSCOPY;  Service: Endoscopy;  Laterality: N/A;  Moderate sedation okay if MAC not available    Family History  Problem Relation Age of Onset  . Cancer Mother     Breast Cancer with Metastatic disease  . Alzheimer's disease Father   . Other Brother 17    GSW    Medications-  reviewed and updated Current Outpatient Prescriptions  Medication Sig Dispense Refill  . aspirin 81 MG tablet Take 81 mg by mouth at bedtime.     Marland Kitchen atenolol (TENORMIN) 25 MG tablet Take 1 tablet by mouth  every morning 90 tablet 3  . baclofen (LIORESAL) 10 MG tablet Take 1 tablet (10 mg total) by mouth 3 (three) times daily. 90 each 6  . carboxymethylcellulose (REFRESH TEARS) 0.5 % SOLN Place 1 drop into both eyes 2 (two) times daily.     . cholecalciferol (VITAMIN D) 1000 UNITS tablet Take 1,000 Units by mouth 2 (two) times daily.     Marland Kitchen  cyanocobalamin 2000 MCG tablet Take 2,000 mcg by mouth daily at 12 noon.     . docusate sodium (COLACE) 100 MG capsule Take 200 mg by mouth at bedtime.    . feeding supplement (BOOST HIGH PROTEIN) LIQD Take 1 Container by mouth daily.    Marland Kitchen FLUoxetine (PROZAC) 20 MG capsule Take 1 capsule (20 mg total) by mouth daily. 90 capsule 2  . gabapentin (NEURONTIN) 400 MG capsule 2 caps 3 times daily and 3 at hs 270 capsule 6  . hydrocortisone cream 1 % Apply 1 application topically 2 (two) times daily. For rectal cyst    . levothyroxine (SYNTHROID, LEVOTHROID) 175 MCG tablet Take 1 tablet by mouth  daily before breakfast 90 tablet 2  . lipase/protease/amylase (CREON) 12000 UNITS CPEP capsule Take 2 capsules (24,000 Units total) by mouth 3 (three) times daily before meals. 270 capsule 5  . loratadine (CLARITIN) 10 MG tablet Take 10 mg by mouth daily.    Marland Kitchen lovastatin (MEVACOR) 40 MG tablet Take 1 tablet by mouth at  bedtime 90 tablet 2  . Multiple Vitamin (MULTIVITAMIN WITH MINERALS) TABS tablet Take 1 tablet by mouth daily.    Marland Kitchen omeprazole (PRILOSEC OTC) 20 MG tablet Take 20 mg by mouth 2 (two) times daily.     . OXcarbazepine (TRILEPTAL) 150 MG tablet TAKE 1/2 TABLET TWICE A DAY FOR 1 WEEK THEN 1 TABLET TWICE A DAY 60 tablet 6  . polycarbophil (FIBERCON) 625 MG tablet Take 625 mg by mouth daily.     . promethazine (PHENERGAN) 25 MG tablet Take 25 mg by mouth every 6  (six) hours as needed for nausea or vomiting.    Marland Kitchen rOPINIRole (REQUIP) 0.5 MG tablet Take 1 tablet (0.5 mg total) by mouth at bedtime. 30 tablet 6  . Simethicone 180 MG CAPS Take 180 mg by mouth 4 (four) times daily.     . vitamin E 400 UNIT capsule Take 800 Units by mouth 2 (two) times daily. For tartive dyskinesia    . acetaminophen (TYLENOL) 325 MG tablet Take 650 mg by mouth every 6 (six) hours as needed for mild pain, moderate pain or fever. Reported on 07/31/2015    . acetaminophen-codeine (TYLENOL #3) 300-30 MG tablet Take 1 tablet by mouth every 4 (four) hours as needed for moderate pain or severe pain. Reported on 07/31/2015    . aluminum & magnesium hydroxide-simethicone (MYLANTA) 500-450-40 MG/5ML suspension Take 5 mLs by mouth every 6 (six) hours as needed for indigestion. Reported on 07/31/2015    . meclizine (ANTIVERT) 25 MG tablet TAKE 1 TABLET BY MOUTH EVERY 6 HOURS AS NEEDED FOR NAUSEA OR DIZZINESS (Patient not taking: Reported on 07/31/2015) 100 tablet 1   Allergies-reviewed and updated Allergies  Allergen Reactions  . Other Other (See Comments)    According to patient Trilafor and Reglan cause Tardive Dyskinesia  . Metoclopramide Hcl Other (See Comments)    Shaking; caused tardive dyskinesia  . Niacin Other (See Comments)    shaking  . Trovan [Alatrofloxacin Mesylate] Other (See Comments)    Caused shaking and nervousness  . Nortriptyline Other (See Comments)    Dizziness   . Benzocaine-Resorcinol Rash  . Celecoxib Other (See Comments)    unknown  . Glucosamine Other (See Comments)    unknown  . Phenazopyridine Hcl Other (See Comments)    unknown  . Sulfa Antibiotics Nausea And Vomiting and Rash  . Sulfonamide Derivatives Nausea And Vomiting and Rash    Social History  Social History  . Marital Status: Widowed    Spouse Name: N/A  . Number of Children: 2  . Years of Education: 8   Occupational History  .    Marland Kitchen Retired    Social History Main Topics  . Smoking  status: Never Smoker   . Smokeless tobacco: Never Used  . Alcohol Use: No  . Drug Use: No  . Sexual Activity: Not Currently   Other Topics Concern  . 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

## 2015-07-31 NOTE — Assessment & Plan Note (Signed)
S: complains of low back pain since getting out of the hospital. Pain is diffuse over low back. Tylenol #3 helps some A/P: did have some compression fractures noted on CXR in thoracic spine- could be similar findings in lumbar spine. We will get a bone density- she is going to do this when gets mammogram- suspect she may need fosamax.

## 2015-07-31 NOTE — Patient Instructions (Signed)
  Ms. Aragon , Thank you for taking time to come for your Medicare Wellness Visit. I appreciate your ongoing commitment to your health goals. Please review the following plan we discussed and let me know if I can assist you in the future.   These are the goals we discussed: 1. Stop 3rd pill of gabapentin at night. Continue 2 pills 3x a day 2. Get a bone density when you get your mamogram and have them send me results. They can do this under osteoporosis.  3. Consider getting hearing aids through [employer]- we can refer to general audiology if needed 4. Set a follow up for perhaps 4-6 weeks so we can check in and see if we can go further down on gabapentin 5. Check with insurance to see if price has gone down on shingles vaccine    This is a list of the screening recommended for you and due dates:  Health Maintenance  Topic Date Due  . Tetanus Vaccine  08/29/1958  . Shingles Vaccine  08/29/1999  . Flu Shot  12/29/2015  . DEXA scan (bone density measurement)  Completed  . Pneumonia vaccines  Completed

## 2015-08-03 ENCOUNTER — Other Ambulatory Visit (HOSPITAL_BASED_OUTPATIENT_CLINIC_OR_DEPARTMENT_OTHER): Payer: Medicare Other

## 2015-08-03 ENCOUNTER — Ambulatory Visit (HOSPITAL_BASED_OUTPATIENT_CLINIC_OR_DEPARTMENT_OTHER): Payer: Medicare Other | Admitting: Oncology

## 2015-08-03 ENCOUNTER — Telehealth: Payer: Self-pay | Admitting: Oncology

## 2015-08-03 VITALS — BP 167/59 | HR 70 | Temp 97.9°F | Resp 18 | Ht 61.0 in | Wt 160.2 lb

## 2015-08-03 DIAGNOSIS — R110 Nausea: Secondary | ICD-10-CM

## 2015-08-03 DIAGNOSIS — C259 Malignant neoplasm of pancreas, unspecified: Secondary | ICD-10-CM | POA: Diagnosis not present

## 2015-08-03 DIAGNOSIS — G629 Polyneuropathy, unspecified: Secondary | ICD-10-CM

## 2015-08-03 DIAGNOSIS — D696 Thrombocytopenia, unspecified: Secondary | ICD-10-CM | POA: Diagnosis not present

## 2015-08-03 DIAGNOSIS — R109 Unspecified abdominal pain: Secondary | ICD-10-CM | POA: Diagnosis not present

## 2015-08-03 DIAGNOSIS — R609 Edema, unspecified: Secondary | ICD-10-CM | POA: Diagnosis not present

## 2015-08-03 DIAGNOSIS — C250 Malignant neoplasm of head of pancreas: Secondary | ICD-10-CM

## 2015-08-03 NOTE — Progress Notes (Signed)
  [place] OFFICE PROGRESS NOTE   Diagnosis: Pancreas cancer  INTERVAL HISTORY:   [name] returns as scheduled. She has multiple complaints including abdominal pain since the pancreas surgery, chronic low back pain, left knee and lower leg pain and swelling. She was admitted last week with atypical chest pain. She complains of neuropathy symptoms in the legs. She is considering having the abdominal hernia repair by Dr. [name]. Good appetite.  Objective:  Vital signs in last 24 hours:  Blood pressure 167/59, pulse 70, temperature 97.9 F (36.6 C), temperature source Oral, resp. rate 18, height 5\' 1"  (1.549 m), weight 160 lb 3.2 oz (72.666 kg), SpO2 97 %.    HEENT: Neck without mass Lymphatics: No cervical, supra-clavicular, axillary, or inguinal nodes Resp: Lungs clear bilaterally Cardio: Regular rate and rhythm GI: No hepatomegaly, no mass, no apparent ascites, reducible ventral hernia Vascular: Trace edema at the left greater than right lower leg, edema at the left knee   Lab Results:  Lab Results  Component Value Date   WBC 6.2 07/29/2015   HGB 12.2 07/29/2015   HCT 37.9 07/29/2015   MCV 101.1* 07/29/2015   PLT 131* 07/29/2015   NEUTROABS 5.7 02/15/2014     Medications: I have reviewed the patient's current medications.  Assessment/Plan: 1. 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  CT abdomen/pelvis 01/19/2015 with no evidence of recurrent/metastatic disease.  2. Nausea following the Whipple procedure-improved 3. Mild thrombocytopenia , Chronic 4. Abdominal pain. Mild, etiology unclear. She did not complain  of abdominal pain 02/03/2015.  Disposition:  [name] remains in clinical remission from pancreas cancer. We will follow-up on the CA 19-9 from today. She will return for an office visit in 6 months.  She will see Dr. [name] to discuss repair of the ventral hernia. She is followed by Dr. Yong Channel for management of neuropathy and the lower leg edema.  Betsy Coder, MD  08/03/2015  10:46 AM

## 2015-08-03 NOTE — Telephone Encounter (Signed)
Gave and printed appt sched and avs for pt for Sept °

## 2015-08-04 ENCOUNTER — Telehealth: Payer: Self-pay | Admitting: *Deleted

## 2015-08-04 ENCOUNTER — Ambulatory Visit: Payer: Medicare Other

## 2015-08-04 LAB — CANCER ANTIGEN 19-9 (PARALLEL TESTING): CA 19 9: 3 U/mL (ref <34)

## 2015-08-04 LAB — CANCER ANTIGEN 19-9: CAN 19-9: 1 U/mL (ref 0–35)

## 2015-08-04 NOTE — Progress Notes (Deleted)
Cardiology Office Note   Date:  08/04/2015   ID:  [name], DOB 1940-12-20, MRN [id number]  PCP:  [name] Reddish, MD  Cardiologist:   [name] Harness, MD   No chief complaint on file.     Patient ID: [name] is a 75 y.o. female with hypertension, hyperlipidemia, mitral valve prolapse and pancreatic cancer s/p pancreaticoduodenectomy and no evidence of recurrent disease who presents for an evaluation of syncope.    Interval History 08/05/15: After [name] last appointment [name] underwent [employer] that was negative for ischemia.  [name] was seen in the ED on 3/1 with a complaint of chest pain.  Cardiac enzymes were negative.  [name] symptoms were thought to be consistent with GERD.     History of Present Illness 03/03/15:[name] reports recurrent syncopal episodes that have been occurring since [name] teens.  It improved in [name] early adulthood but recurred in [name] 20s.  The episodes are sporadic but have been occurring more frequently recently.  The last episode was one month ago.  She passed out and broke [name] arm.  At the time she was carrying groceries and either tripped or passed out, she isn't sure which.  The following day she was standing in the kitchen when she felt presycopal, turned to try and sit down but the next thing she remembers, she was on the floor.  She denies any preceding chest pain, palpitations or shortness of breath.  The episodes occur several months apart and occur without warning.  There is no loss of bowel or bladder function or tonic-clonic movement.  There is no post-ictal confusion.  Sometimes she has warning symptoms that consist of sweating, shortness of breath and nausea.  She denies palpitations or chest pain.  [name] also reports dizziness with bending over or turning [name] head side to side.  She does not get any exercise, so she cannot determine if the symptoms are worse with exertion.     She was referred to cardiology for risk  assessment prior to hernia surgery.  She also reported the syncopal episodes to [name] oncologist on 9/6 and was referred to cardiology for further evaluation.   Past Medical History  Diagnosis Date  . Osteoarthritis   . Hiatal hernia   . Chronic maxillary sinusitis     neti pot  . Depression     alone a lot  . Eustachian tube dysfunction   . Tardive dyskinesia     possibly reglan, vitamin E helps  . Mitral valve prolapse     antibiotics before dental procedures  . GERD (gastroesophageal reflux disease)   . Allergic rhinitis   . Glaucoma   . Hypothyroid   . Hypertension   . Heart murmur     hx. "MVP" -predental antibiotics  . Memory loss     short term memory loss  . Urgency of urination     some UTI in past  . Stroke [facility])     mini storkes left leg paraylsis. patient denies weakness 01/08/14.   . Cancer ([place]) 07/10/13    Pancreatic cancer with MRI scan 06-19-13    Past Surgical History  Procedure Laterality Date  . Knee surgery Left     x 5, total knee Left knee  . 1 baker cyst removed    . Dilation and curettage of uterus      x3  . Lumbar spine surgery      x2  . Abdominal hysterectomy  including ovaries  . Lumbar spine surgery      cyst  . Joint replacement      LTKA  . Blepharoplasty Bilateral     with cataract surgery  . Eye surgery Right     cataract  . Eus N/A 07/10/2013    Procedure: ESOPHAGEAL ENDOSCOPIC ULTRASOUND (EUS) RADIAL;  Surgeon: Arta Silence, MD;  Location: [facility] ENDOSCOPY;  Service: Endoscopy;  Laterality: N/A;  . Fine needle aspiration N/A 07/10/2013    Procedure: FINE NEEDLE ASPIRATION (FNA) LINEAR;  Surgeon: Arta Silence, MD;  Location: [facility] ENDOSCOPY;  Service: Endoscopy;  Laterality: N/A;  possible fna  . Back surgery      fusion  . Breast surgery      Biopsy left 2 times  . Colonoscopy w/ polypectomy      2004 last colonoscopy, no polyps  . Laparoscopy N/A 08/07/2013    Procedure: LAPAROSCOPY DIAGNOSTIC;  Surgeon: [name], MD;   Location: [facility];  Service: General;  Laterality: N/A;  . Whipple procedure N/A 08/07/2013    Procedure: WHIPPLE PROCEDURE;  Surgeon: [name], MD;  Location: [place];  Service: General;  Laterality: N/A;  . Esophagogastroduodenoscopy N/A 09/11/2013    Procedure: ESOPHAGOGASTRODUODENOSCOPY (EGD);  Surgeon: Cleotis Nipper, MD;  Location: [facility] ENDOSCOPY;  Service: Endoscopy;  Laterality: N/A;  Moderate sedation okay if MAC not available     Current Outpatient Prescriptions  Medication Sig Dispense Refill  . acetaminophen (TYLENOL) 325 MG tablet Take 650 mg by mouth every 6 (six) hours as needed for mild pain, moderate pain or fever. Reported on 07/31/2015    . acetaminophen-codeine (TYLENOL #3) 300-30 MG tablet Take 1 tablet by mouth every 4 (four) hours as needed for moderate pain or severe pain. Reported on 07/31/2015    . aluminum & magnesium hydroxide-simethicone (MYLANTA) 500-450-40 MG/5ML suspension Take 5 mLs by mouth every 6 (six) hours as needed for indigestion. Reported on 07/31/2015    . aspirin 81 MG tablet Take 81 mg by mouth at bedtime.     Marland Kitchen atenolol (TENORMIN) 25 MG tablet Take 1 tablet by mouth  every morning 90 tablet 3  . baclofen (LIORESAL) 10 MG tablet Take 1 tablet (10 mg total) by mouth 3 (three) times daily. 90 each 6  . carboxymethylcellulose (REFRESH TEARS) 0.5 % SOLN Place 1 drop into both eyes 2 (two) times daily.     . cholecalciferol (VITAMIN D) 1000 UNITS tablet Take 1,000 Units by mouth 2 (two) times daily.     . cyanocobalamin 2000 MCG tablet Take 2,000 mcg by mouth daily at 12 noon.     . docusate sodium (COLACE) 100 MG capsule Take 200 mg by mouth at bedtime.    . feeding supplement (BOOST HIGH PROTEIN) LIQD Take 1 Container by mouth daily.    Marland Kitchen FLUoxetine (PROZAC) 20 MG capsule Take 1 capsule (20 mg total) by mouth daily. 90 capsule 2  . gabapentin (NEURONTIN) 400 MG capsule 2 caps 3 times daily and 3 at hs (Patient taking differently: 800 mg 3 (three) times daily. 2  caps 3 times daily and 3 at hs) 270 capsule 6  . hydrocortisone cream 1 % Apply 1 application topically 2 (two) times daily. For rectal cyst    . levothyroxine (SYNTHROID, LEVOTHROID) 175 MCG tablet Take 1 tablet by mouth  daily before breakfast 90 tablet 2  . lipase/protease/amylase (CREON) 12000 UNITS CPEP capsule Take 2 capsules (24,000 Units total) by mouth 3 (three) times daily before meals.  270 capsule 5  . loratadine (CLARITIN) 10 MG tablet Take 10 mg by mouth daily.    Marland Kitchen lovastatin (MEVACOR) 40 MG tablet Take 1 tablet by mouth at  bedtime 90 tablet 2  . meclizine (ANTIVERT) 25 MG tablet TAKE 1 TABLET BY MOUTH EVERY 6 HOURS AS NEEDED FOR NAUSEA OR DIZZINESS (Patient not taking: Reported on 07/31/2015) 100 tablet 1  . Multiple Vitamin (MULTIVITAMIN WITH MINERALS) TABS tablet Take 1 tablet by mouth daily.    Marland Kitchen omeprazole (PRILOSEC OTC) 20 MG tablet Take 20 mg by mouth 2 (two) times daily.     . OXcarbazepine (TRILEPTAL) 150 MG tablet TAKE 1/2 TABLET TWICE A DAY FOR 1 WEEK THEN 1 TABLET TWICE A DAY 60 tablet 6  . polycarbophil (FIBERCON) 625 MG tablet Take 625 mg by mouth daily.     . promethazine (PHENERGAN) 25 MG tablet Take 25 mg by mouth every 6 (six) hours as needed for nausea or vomiting.    Marland Kitchen rOPINIRole (REQUIP) 0.5 MG tablet Take 1 tablet (0.5 mg total) by mouth at bedtime. 30 tablet 6  . Simethicone 180 MG CAPS Take 180 mg by mouth 4 (four) times daily.     . vitamin E 400 UNIT capsule Take 800 Units by mouth 2 (two) times daily. For tartive dyskinesia     No current facility-administered medications for this visit.    Allergies:   Other; Metoclopramide hcl; Niacin; Trovan; Nortriptyline; Benzocaine-resorcinol; Celecoxib; Glucosamine; Phenazopyridine hcl; Sulfa antibiotics; and Sulfonamide derivatives    Social History:  The patient  reports that she has never smoked. She has never used smokeless tobacco. She reports that she does not drink alcohol or use illicit drugs.   Family  History:  The patient's family history includes Alzheimer's disease in [name] father; Cancer in [name] mother; Other (age of onset: 13) in [name] brother.    ROS:  Please see the history of present illness.   Otherwise, review of systems are positive for headaches, back pain.   All other systems are reviewed and negative.    PHYSICAL EXAM: VS:  There were no vitals taken for this visit. , BMI There is no weight on file to calculate BMI. GENERAL:  Well appearing HEENT:  Pupils equal round and reactive, fundi not visualized, oral mucosa unremarkable NECK:  No jugular venous distention, waveform within normal limits, carotid upstroke brisk and symmetric, no bruits, no thyromegaly.  No syncope with carotid massage. LYMPHATICS:  No cervical adenopathy LUNGS:  Clear to auscultation bilaterally HEART:  RRR.  PMI not displaced or sustained,S1 and S2 within normal limits, no S3, no S4, no clicks, no rubs, no murmurs ABD:  Flat, positive bowel sounds normal in frequency in pitch, no bruits, no rebound, no guarding, no midline pulsatile mass, no hepatomegaly, no splenomegaly EXT:  2 plus pulses throughout, no edema, no cyanosis no clubbing SKIN:  No rashes no nodules NEURO:  Cranial nerves II through XII grossly intact, motor grossly intact throughout PSYCH:  Cognitively intact, oriented to person place and time    EKG:  EKG is ordered today. The ekg ordered today demonstrates sinus rhythm at 79 bpm.  Late R wave progression.     Recent Labs: 07/28/2015: ALT 28; BUN 10; Potassium 3.8; Sodium 142 07/29/2015: Creatinine, Ser 0.81; Hemoglobin 12.2; Platelets 131*    Lipid Panel    Component Value Date/Time   CHOL 189 12/20/2006 1055   TRIG 260* 09/02/2013 0510   HDL 43.3 12/20/2006 1055   CHOLHDL  4.4 CALC 12/20/2006 1055   VLDL 56* 12/20/2006 1055   LDLDIRECT 113.1 12/20/2006 1055      Wt Readings from Last 3 Encounters:  08/03/15 72.666 kg (160 lb 3.2 oz)  07/31/15 73.483 kg (162 lb)  07/29/15  72.2 kg (159 lb 2.8 oz)      Other studies Reviewed: Additional studies/ records that were reviewed today include:  Review of the above records demonstrates:  Please see elsewhere in the note.     ASSESSMENT AND PLAN:  # Syncope: Episodes are sporadic and have minimal warning signs.  Given that this has been ongoing since [name] teens, it is likely that this is vasovagal or neurocardiogenic syncope.  Ischemia or malignant arrhythmias are very unlikely.  She was orthostatic with a 30 mmHg drop in systolic blood pressure when she went from sitting (166/86) to standing (137/81).  This certainly could be contributing to [name] symptoms, but likely does not explain it all.  She had no syncope or pre-syncope with carotid massage, making carotid hypersensitivity unlikely.  [name] carotid ultrasound showed no stenosis in either carotid artery and antegrade vertebral flow, making vertebrobasilar insufficiency unlikely.  We have recommended that she increase [name] fluid intake to at least 8, 8oz glasses of water or other fluids daily.  She also should have a more permissive hypertension goal, <150/90.  Continue atenolol for now.  She is on Cymbalta, which can cause syncope and orthostatic hypotension.  We have recommended that she consider stopping this and discussing alternatives with [name] PCP.  We recommended compression stockings and raising the head of [name] bed.  # Pre-operative risk assessment: We are unable to adequately assess [name]'s pre-surgical risk, as she does not get any exercise.  Therefore, we will refer [name] for [facility].   # Memory loss: [name] seemed to have some memory difficulties throughout the interview.  She was accompanied by [name], who provided many of the details of [name] history.  [name] continues to drive herself.  Given [name] memory deficits and [name] syncope, this is troublesome.  I discussed my concerns with both [name] and [name].  The agreed to  limit [name] driving to the area immediately around [name] home.  This should be an ongoing discussion.  # Hyperlipidemia: Continue lovastatin.  Monitored by PCP.  Current medicines are reviewed at length with the patient today.  The patient does not have concerns regarding medicines.  The following changes have been made:  no change  Labs/ tests ordered today include:   No orders of the defined types were placed in this encounter.     Disposition:   FU with Sayer Masini C. Oval Linsey, MD in 6 months.  Signed, [name] Harness, MD  08/04/2015 10:07 PM    [place]

## 2015-08-04 NOTE — Telephone Encounter (Signed)
-----   Message from Ladell Pier, MD sent at 08/04/2015  9:56 AM EST ----- Please call patient, ca19-9 is normal

## 2015-08-04 NOTE — Telephone Encounter (Signed)
Called pt with CA19-9 results: Normal, per Dr. [name]. She voiced appreciation for call.

## 2015-08-05 ENCOUNTER — Encounter: Payer: Medicare Other | Admitting: Cardiovascular Disease

## 2015-08-06 ENCOUNTER — Encounter: Payer: Self-pay | Admitting: *Deleted

## 2015-08-06 ENCOUNTER — Ambulatory Visit (INDEPENDENT_AMBULATORY_CARE_PROVIDER_SITE_OTHER): Payer: Medicare Other | Admitting: Cardiology

## 2015-08-06 ENCOUNTER — Telehealth: Payer: Self-pay | Admitting: Cardiovascular Disease

## 2015-08-06 ENCOUNTER — Encounter: Payer: Self-pay | Admitting: Cardiology

## 2015-08-06 VITALS — BP 150/82 | HR 72 | Ht 61.0 in | Wt 158.0 lb

## 2015-08-06 DIAGNOSIS — R600 Localized edema: Secondary | ICD-10-CM

## 2015-08-06 DIAGNOSIS — I10 Essential (primary) hypertension: Secondary | ICD-10-CM

## 2015-08-06 MED ORDER — FUROSEMIDE 20 MG PO TABS: 20.0000 mg | ORAL_TABLET | Freq: Every day | ORAL | Status: DC

## 2015-08-06 MED ORDER — POTASSIUM CHLORIDE ER 10 MEQ PO TBCR: 10.0000 meq | EXTENDED_RELEASE_TABLET | Freq: Every day | ORAL | Status: DC

## 2015-08-06 NOTE — Patient Instructions (Addendum)
Medication Instructions:  Your physician has recommended you make the following change in your medication:  1.  START Lasix 20 mg taking 1 tablet daily 2.  START Potassium 10 meq taking 1 tablet daily   Labwork: 08/13/15:  BMET  Testing/Procedures: NONE ORDERED  Follow-Up: Your physician recommends that you schedule a follow-up appointment in: 2 WEEKS WITH DR. Eddington   Any Other Special Instructions Will Be Listed Below (If Applicable).  If you need a refill on your cardiac medications before your next appointment, please call your pharmacy.  Low-Sodium Eating Plan Sodium raises blood pressure and causes water to be held in the body. Getting less sodium from food will help lower your blood pressure, reduce any swelling, and protect your heart, liver, and kidneys. We get sodium by adding salt (sodium chloride) to food. Most of our sodium comes from canned, boxed, and frozen foods. Restaurant foods, fast foods, and pizza are also very high in sodium. Even if you take medicine to lower your blood pressure or to reduce fluid in your body, getting less sodium from your food is important. WHAT IS MY PLAN? Most people should limit their sodium intake to 2,300 mg a day. Your health care provider recommends that you limit your sodium intake to __________ a day.  WHAT DO I NEED TO KNOW ABOUT THIS EATING PLAN? For the low-sodium eating plan, you will follow these general guidelines:  Choose foods with a % Daily Value for sodium of less than 5% (as listed on the food label).   Use salt-free seasonings or herbs instead of table salt or sea salt.   Check with your health care provider or pharmacist before using salt substitutes.   Eat fresh foods.  Eat more vegetables and fruits.  Limit canned vegetables. If you do use them, rinse them well to decrease the sodium.   Limit cheese to 1 oz (28 g) per day.   Eat lower-sodium products, often labeled as "lower sodium" or "no salt  added."  Avoid foods that contain monosodium glutamate (MSG). MSG is sometimes added to [place] food and some canned foods.  Check food labels (Nutrition Facts labels) on foods to learn how much sodium is in one serving.  Eat more home-cooked food and less restaurant, buffet, and fast food.  When eating at a restaurant, ask that your food be prepared with less salt, or no salt if possible.  HOW DO I READ FOOD LABELS FOR SODIUM INFORMATION? The Nutrition Facts label lists the amount of sodium in one serving of the food. If you eat more than one serving, you must multiply the listed amount of sodium by the number of servings. Food labels may also identify foods as:  Sodium free--Less than 5 mg in a serving.  Very low sodium--35 mg or less in a serving.  Low sodium--140 mg or less in a serving.  Light in sodium--50% less sodium in a serving. For example, if a food that usually has 300 mg of sodium is changed to become light in sodium, it will have 150 mg of sodium.  Reduced sodium--25% less sodium in a serving. For example, if a food that usually has 400 mg of sodium is changed to reduced sodium, it will have 300 mg of sodium. WHAT FOODS CAN I EAT? Grains Low-sodium cereals, including oats, puffed wheat and rice, and shredded wheat cereals. Low-sodium crackers. Unsalted rice and pasta. Lower-sodium bread.  Vegetables Frozen or fresh vegetables. Low-sodium or reduced-sodium canned vegetables. Low-sodium or reduced-sodium tomato  sauce and paste. Low-sodium or reduced-sodium tomato and vegetable juices.  Fruits Fresh, frozen, and canned fruit. Fruit juice.  Meat and Other Protein Products Low-sodium canned tuna and salmon. Fresh or frozen meat, poultry, seafood, and fish. Lamb. Unsalted nuts. Dried beans, peas, and lentils without added salt. Unsalted canned beans. Homemade soups without salt. Eggs.  Dairy Milk. Soy milk. Ricotta cheese. Low-sodium or reduced-sodium cheeses.  Yogurt.  Condiments Fresh and dried herbs and spices. Salt-free seasonings. Onion and garlic powders. Low-sodium varieties of mustard and ketchup. Fresh or refrigerated horseradish. Lemon juice.  Fats and Oils Reduced-sodium salad dressings. Unsalted butter.  Other Unsalted popcorn and pretzels.  The items listed above may not be a complete list of recommended foods or beverages. Contact your dietitian for more options. WHAT FOODS ARE NOT RECOMMENDED? Grains Instant hot cereals. Bread stuffing, pancake, and biscuit mixes. Croutons. Seasoned rice or pasta mixes. Noodle soup cups. Boxed or frozen macaroni and cheese. Self-rising flour. Regular salted crackers. Vegetables Regular canned vegetables. Regular canned tomato sauce and paste. Regular tomato and vegetable juices. Frozen vegetables in sauces. Salted [place] fries. Olives. Angie Fava. Relishes. Sauerkraut. Salsa. Meat and Other Protein Products Salted, canned, smoked, spiced, or pickled meats, seafood, or fish. Bacon, ham, sausage, hot dogs, corned beef, chipped beef, and packaged luncheon meats. Salt pork. Jerky. Pickled herring. Anchovies, regular canned tuna, and sardines. Salted nuts. Dairy Processed cheese and cheese spreads. Cheese curds. Blue cheese and cottage cheese. Buttermilk.  Condiments Onion and garlic salt, seasoned salt, table salt, and sea salt. Canned and packaged gravies. Worcestershire sauce. Tartar sauce. Barbecue sauce. Teriyaki sauce. Soy sauce, including reduced sodium. Steak sauce. Fish sauce. Oyster sauce. Cocktail sauce. Horseradish that you find on the shelf. Regular ketchup and mustard. Meat flavorings and tenderizers. Bouillon cubes. Hot sauce. Tabasco sauce. Marinades. Taco seasonings. Relishes. Fats and Oils Regular salad dressings. Salted butter. Margarine. Ghee. Bacon fat.  Other Potato and tortilla chips. Corn chips and puffs. Salted popcorn and pretzels. Canned or dried soups. Pizza. Frozen  entrees and pot pies.  The items listed above may not be a complete list of foods and beverages to avoid. Contact your dietitian for more information.   This information is not intended to replace advice given to you by your health care provider. Make sure you discuss any questions you have with your health care provider.   Document Released: 11/05/2001 Document Revised: 06/06/2014 Document Reviewed: 03/20/2013 Elsevier Interactive Patient Education [employer].

## 2015-08-06 NOTE — Telephone Encounter (Signed)
Pt c/o swelling: STAT is pt has developed SOB within 24 hours  Pt No-showed appt w/ Dr Oval Linsey yesterday 3/8- requested to speak w/ RN about the swelling in her feet. Please call back and discuss.    1. How long have you been experiencing swelling? 3 weeks  2. Where is the swelling located? Feet/loegs both   3.  Are you currently taking a "fluid pill"? no  4.  Are you currently SOB? no  5.  Have you traveled recently?no

## 2015-08-06 NOTE — Progress Notes (Signed)
Cardiology Office Note   Date:  08/06/2015   ID:  [name], DOB 03-Oct-1940, MRN [id number]  PCP:  [name], MD  Cardiologist:  Dr. Oval Linsey    No chief complaint on file.     History of Present Illness: [name] is a 75 y.o. female who presents for swelling.    She has a hx. hypertension, hyperlipidemia, mitral valve prolapse and pancreatic cancer s/p pancreaticoduodenectomy and no evidence of recurrent disease who presents for swelling.  She was to have seen Dr. Oval Linsey yesterday but was unable to attend appt.   Other hx with nuc stress test neg for ischemia nd EF 56%.03/10/15.  Echo 09/2011 with EF 55-60%G![facility] MR.  Was hospitalized 2/28-07/29/15 for atypical chest pain.  Her EKG was stable, her troponins were negative. She had no further chest pain.  Dr. Marlou Porch saw her and felt it was GI and was reassured with normal stress test in October.  Her legs have both been swelling over last 2-3 weeks though they do improve overnight with ACE wrap.  No SOB.  No chest pain.  She uses some salt but not much.       Past Medical History  Diagnosis Date  . Osteoarthritis   . Hiatal hernia   . Chronic maxillary sinusitis     neti pot  . Depression     alone a lot  . Eustachian tube dysfunction   . Tardive dyskinesia     possibly reglan, vitamin E helps  . Mitral valve prolapse     antibiotics before dental procedures  . GERD (gastroesophageal reflux disease)   . Allergic rhinitis   . Glaucoma   . Hypothyroid   . Hypertension   . Heart murmur     hx. "MVP" -predental antibiotics  . Memory loss     short term memory loss  . Urgency of urination     some UTI in past  . Stroke [facility])     mini storkes left leg paraylsis. patient denies weakness 01/08/14.   . Cancer ([place]) 07/10/13    Pancreatic cancer with MRI scan 06-19-13    Past Surgical History  Procedure Laterality Date  . Knee surgery Left     x 5, total knee Left knee  . 1 baker cyst removed    .  Dilation and curettage of uterus      x3  . Lumbar spine surgery      x2  . Abdominal hysterectomy      including ovaries  . Lumbar spine surgery      cyst  . Joint replacement      LTKA  . Blepharoplasty Bilateral     with cataract surgery  . Eye surgery Right     cataract  . Eus N/A 07/10/2013    Procedure: ESOPHAGEAL ENDOSCOPIC ULTRASOUND (EUS) RADIAL;  Surgeon: Arta Silence, MD;  Location: [facility] ENDOSCOPY;  Service: Endoscopy;  Laterality: N/A;  . Fine needle aspiration N/A 07/10/2013    Procedure: FINE NEEDLE ASPIRATION (FNA) LINEAR;  Surgeon: Arta Silence, MD;  Location: [facility] ENDOSCOPY;  Service: Endoscopy;  Laterality: N/A;  possible fna  . Back surgery      fusion  . Breast surgery      Biopsy left 2 times  . Colonoscopy w/ polypectomy      2004 last colonoscopy, no polyps  . Laparoscopy N/A 08/07/2013    Procedure: LAPAROSCOPY DIAGNOSTIC;  Surgeon: [name], MD;  Location: [place];  Service: General;  Laterality: N/A;  . Whipple procedure N/A 08/07/2013    Procedure: WHIPPLE PROCEDURE;  Surgeon: [name], MD;  Location: [place];  Service: General;  Laterality: N/A;  . Esophagogastroduodenoscopy N/A 09/11/2013    Procedure: ESOPHAGOGASTRODUODENOSCOPY (EGD);  Surgeon: Cleotis Nipper, MD;  Location: [facility] ENDOSCOPY;  Service: Endoscopy;  Laterality: N/A;  Moderate sedation okay if MAC not available     Current Outpatient Prescriptions  Medication Sig Dispense Refill  . acetaminophen (TYLENOL) 325 MG tablet Take 650 mg by mouth every 6 (six) hours as needed for mild pain, moderate pain or fever. Reported on 07/31/2015    . acetaminophen-codeine (TYLENOL #3) 300-30 MG tablet Take 1 tablet by mouth every 4 (four) hours as needed for moderate pain or severe pain. Reported on 07/31/2015    . aluminum & magnesium hydroxide-simethicone (MYLANTA) 500-450-40 MG/5ML suspension Take 5 mLs by mouth every 6 (six) hours as needed for indigestion. Reported on 07/31/2015    . aspirin 81 MG tablet  Take 81 mg by mouth at bedtime.     Marland Kitchen atenolol (TENORMIN) 25 MG tablet Take 1 tablet by mouth  every morning 90 tablet 3  . baclofen (LIORESAL) 10 MG tablet Take 1 tablet (10 mg total) by mouth 3 (three) times daily. 90 each 6  . carboxymethylcellulose (REFRESH TEARS) 0.5 % SOLN Place 1 drop into both eyes 2 (two) times daily.     . cholecalciferol (VITAMIN D) 1000 UNITS tablet Take 1,000 Units by mouth 2 (two) times daily.     . cyanocobalamin 2000 MCG tablet Take 2,000 mcg by mouth daily at 12 noon.     . docusate sodium (COLACE) 100 MG capsule Take 200 mg by mouth at bedtime.    . feeding supplement (BOOST HIGH PROTEIN) LIQD Take 1 Container by mouth daily.    Marland Kitchen FLUoxetine (PROZAC) 20 MG capsule Take 1 capsule (20 mg total) by mouth daily. 90 capsule 2  . gabapentin (NEURONTIN) 400 MG capsule 2 caps 3 times daily and 3 at hs (Patient taking differently: 800 mg 3 (three) times daily. 2 caps 3 times daily and 3 at hs) 270 capsule 6  . hydrocortisone cream 1 % Apply 1 application topically 2 (two) times daily. For rectal cyst    . levothyroxine (SYNTHROID, LEVOTHROID) 175 MCG tablet Take 1 tablet by mouth  daily before breakfast 90 tablet 2  . lipase/protease/amylase (CREON) 12000 UNITS CPEP capsule Take 2 capsules (24,000 Units total) by mouth 3 (three) times daily before meals. 270 capsule 5  . loratadine (CLARITIN) 10 MG tablet Take 10 mg by mouth daily.    Marland Kitchen lovastatin (MEVACOR) 40 MG tablet Take 1 tablet by mouth at  bedtime 90 tablet 2  . meclizine (ANTIVERT) 25 MG tablet TAKE 1 TABLET BY MOUTH EVERY 6 HOURS AS NEEDED FOR NAUSEA OR DIZZINESS (Patient not taking: Reported on 07/31/2015) 100 tablet 1  . Multiple Vitamin (MULTIVITAMIN WITH MINERALS) TABS tablet Take 1 tablet by mouth daily.    Marland Kitchen omeprazole (PRILOSEC OTC) 20 MG tablet Take 20 mg by mouth 2 (two) times daily.     . OXcarbazepine (TRILEPTAL) 150 MG tablet TAKE 1/2 TABLET TWICE A DAY FOR 1 WEEK THEN 1 TABLET TWICE A DAY 60 tablet 6    . polycarbophil (FIBERCON) 625 MG tablet Take 625 mg by mouth daily.     . promethazine (PHENERGAN) 25 MG tablet Take 25 mg by mouth every 6 (six) hours as needed for nausea or vomiting.    Marland Kitchen  rOPINIRole (REQUIP) 0.5 MG tablet Take 1 tablet (0.5 mg total) by mouth at bedtime. 30 tablet 6  . Simethicone 180 MG CAPS Take 180 mg by mouth 4 (four) times daily.     . vitamin E 400 UNIT capsule Take 800 Units by mouth 2 (two) times daily. For tartive dyskinesia     No current facility-administered medications for this visit.    Allergies:   Other; Metoclopramide hcl; Niacin; Trovan; Nortriptyline; Benzocaine-resorcinol; Celecoxib; Glucosamine; Phenazopyridine hcl; Sulfa antibiotics; and Sulfonamide derivatives    Social History:  The patient  reports that she has never smoked. She has never used smokeless tobacco. She reports that she does not drink alcohol or use illicit drugs.   Family History:  The patient's family history includes Alzheimer's disease in her father; Cancer in her mother; Other (age of onset: 18) in her brother.    ROS:  General:no colds or fevers, mild weight increase  Skin:no rashes or ulcers HEENT:no blurred vision, no congestion CV:see HPI PUL:see HPI GI:no diarrhea constipation or melena, no indigestion GU:no hematuria, no dysuria, + urinary urgency and frequency-  MS:rt  knee pain and was injected a few weeks ago.  no claudication, severe neuropathy with increase with the edema.  Neuro:no syncope, no lightheadedness Endo:no diabetes, no thyroid disease  Wt Readings from Last 3 Encounters:  08/03/15 160 lb 3.2 oz (72.666 kg)  07/31/15 162 lb (73.483 kg)  07/29/15 159 lb 2.8 oz (72.2 kg)     PHYSICAL EXAM: VS:  There were no vitals taken for this visit. , BMI There is no weight on file to calculate BMI. General:Pleasant affect, NAD Skin:Warm and dry, brisk capillary refill HEENT:normocephalic, sclera clear, mucus membranes moist Neck:supple, no JVD, no bruits   Heart:S1S2 RRR without murmur, gallup, rub or click Lungs:clear without rales, rhonchi, or wheezes [id number], non tender, + BS, do not palpate liver spleen or masses [id number] tight mild pitting lower ext bil.  , 2+ pedal pulses, 2+ radial pulses, rt knee swollen Neuro:alert and oriented X 3, MAE, follows commands, + facial symmetry    EKG:  EKG is NOT ordered today.   Recent Labs: 07/28/2015: ALT 28; BUN 10; Potassium 3.8; Sodium 142 07/29/2015: Creatinine, Ser 0.81; Hemoglobin 12.2; Platelets 131*    Lipid Panel    Component Value Date/Time   CHOL 189 12/20/2006 1055   TRIG 260* 09/02/2013 0510   HDL 43.3 12/20/2006 1055   CHOLHDL 4.4 CALC 12/20/2006 1055   VLDL 56* 12/20/2006 1055   LDLDIRECT 113.1 12/20/2006 1055       Other studies Reviewed: Additional studies/ records that were reviewed today include: hospital notes, nuc study echo. Labs..   ASSESSMENT AND PLAN:  1.  Lower ext edema bil, increasing her neuropathy.  Some of this may be from her Rt knee pain and swelling but will add lasix 20 mg daily and Kdur 10 meq daily.  whe will have BMp next week and follow up with Dr. Oval Linsey in 2-3 weeks.  She will wera the ace wraps daily.  She will decrease salt in her diet -instructions given. She will call if any lightheadedness or dizziness and I would decrease lasix to every other day.    2. Neg nuc study 10/16 and no further chest pain from visit to hospital.   3.  HTN mildly elevated with hx orthostatic hyotension.       Current medicines are reviewed with the patient today.  The patient Has no concerns regarding medicines.  The  following changes have been made:  See above Labs/ tests ordered today include:see above  Disposition:   FU:  see above  Signed, Isaiah Serge, NP  08/06/2015 9:27 AM    [facility] [place], [place], [place], [place] Phone: [phone]; Fax: [phone]

## 2015-08-06 NOTE — Telephone Encounter (Signed)
Returned call. Pt reports progressive swelling, "burning" sensation in feet, legs and knees.  Weight gain. Denies SOB, cough. Occurrent for ~2-3 weeks. She is not currently on a diuretic so no med changes advised until seen in office.  She was set up for OV w/ Dr. Oval Linsey yesterday but felt unwell and had to cancel. We discussed possibility of seeing Dr. Oval Linsey for open appt next week, pt has several schedule conflicts due to other appts/tests. Patient did not have availability tomorrow either.  Pt was added for Karilyn Cota schedule today. Pt expressed some unsurety about [employer] location. She was given specific instructions on location of office including several landmarks and street crossings. She was given office number to call if issues.

## 2015-08-07 DIAGNOSIS — Z79899 Other long term (current) drug therapy: Secondary | ICD-10-CM | POA: Diagnosis not present

## 2015-08-07 DIAGNOSIS — H25812 Combined forms of age-related cataract, left eye: Secondary | ICD-10-CM | POA: Diagnosis not present

## 2015-08-07 DIAGNOSIS — Z961 Presence of intraocular lens: Secondary | ICD-10-CM | POA: Diagnosis not present

## 2015-08-07 DIAGNOSIS — H401132 Primary open-angle glaucoma, bilateral, moderate stage: Secondary | ICD-10-CM | POA: Diagnosis not present

## 2015-08-07 DIAGNOSIS — H40003 Preglaucoma, unspecified, bilateral: Secondary | ICD-10-CM | POA: Diagnosis not present

## 2015-08-07 DIAGNOSIS — Z7982 Long term (current) use of aspirin: Secondary | ICD-10-CM | POA: Diagnosis not present

## 2015-08-07 DIAGNOSIS — H02401 Unspecified ptosis of right eyelid: Secondary | ICD-10-CM | POA: Diagnosis not present

## 2015-08-07 DIAGNOSIS — H353131 Nonexudative age-related macular degeneration, bilateral, early dry stage: Secondary | ICD-10-CM | POA: Diagnosis not present

## 2015-08-10 DIAGNOSIS — Z1231 Encounter for screening mammogram for malignant neoplasm of breast: Secondary | ICD-10-CM | POA: Diagnosis not present

## 2015-08-10 DIAGNOSIS — Z01419 Encounter for gynecological examination (general) (routine) without abnormal findings: Secondary | ICD-10-CM | POA: Diagnosis not present

## 2015-08-10 DIAGNOSIS — Z6829 Body mass index (BMI) 29.0-29.9, adult: Secondary | ICD-10-CM | POA: Diagnosis not present

## 2015-08-10 DIAGNOSIS — M816 Localized osteoporosis [Lequesne]: Secondary | ICD-10-CM | POA: Diagnosis not present

## 2015-08-10 DIAGNOSIS — N958 Other specified menopausal and perimenopausal disorders: Secondary | ICD-10-CM | POA: Diagnosis not present

## 2015-08-11 ENCOUNTER — Ambulatory Visit: Payer: Medicare Other

## 2015-08-13 ENCOUNTER — Other Ambulatory Visit (HOSPITAL_COMMUNITY): Payer: Self-pay | Admitting: Orthopaedic Surgery

## 2015-08-13 ENCOUNTER — Other Ambulatory Visit (HOSPITAL_COMMUNITY): Payer: Self-pay | Admitting: Psychiatry

## 2015-08-13 ENCOUNTER — Other Ambulatory Visit: Payer: Medicare Other

## 2015-08-13 ENCOUNTER — Other Ambulatory Visit: Payer: Self-pay | Admitting: Obstetrics and Gynecology

## 2015-08-13 DIAGNOSIS — Z96652 Presence of left artificial knee joint: Secondary | ICD-10-CM

## 2015-08-13 DIAGNOSIS — M25562 Pain in left knee: Secondary | ICD-10-CM

## 2015-08-13 DIAGNOSIS — R928 Other abnormal and inconclusive findings on diagnostic imaging of breast: Secondary | ICD-10-CM

## 2015-08-14 ENCOUNTER — Ambulatory Visit (HOSPITAL_COMMUNITY)
Admission: RE | Admit: 2015-08-14 | Discharge: 2015-08-14 | Disposition: A | Discharge status: Home / Self Care | Payer: Medicare Other | Source: Ambulatory Visit | Attending: Orthopaedic Surgery | Admitting: Orthopaedic Surgery

## 2015-08-14 DIAGNOSIS — Z820 Family history of epilepsy and other diseases of the nervous system: Secondary | ICD-10-CM | POA: Diagnosis not present

## 2015-08-14 DIAGNOSIS — Z803 Family history of malignant neoplasm of breast: Secondary | ICD-10-CM | POA: Diagnosis not present

## 2015-08-14 DIAGNOSIS — I341 Nonrheumatic mitral (valve) prolapse: Secondary | ICD-10-CM | POA: Diagnosis present

## 2015-08-14 DIAGNOSIS — R936 Abnormal findings on diagnostic imaging of limbs: Secondary | ICD-10-CM | POA: Insufficient documentation

## 2015-08-14 DIAGNOSIS — M25562 Pain in left knee: Secondary | ICD-10-CM

## 2015-08-14 DIAGNOSIS — G2401 Drug induced subacute dyskinesia: Secondary | ICD-10-CM | POA: Diagnosis present

## 2015-08-14 DIAGNOSIS — I10 Essential (primary) hypertension: Secondary | ICD-10-CM | POA: Diagnosis present

## 2015-08-14 DIAGNOSIS — M25572 Pain in left ankle and joints of left foot: Secondary | ICD-10-CM | POA: Diagnosis not present

## 2015-08-14 DIAGNOSIS — E039 Hypothyroidism, unspecified: Secondary | ICD-10-CM | POA: Diagnosis present

## 2015-08-14 DIAGNOSIS — W08XXXA Fall from other furniture, initial encounter: Secondary | ICD-10-CM | POA: Diagnosis present

## 2015-08-14 DIAGNOSIS — Z9181 History of falling: Secondary | ICD-10-CM

## 2015-08-14 DIAGNOSIS — E785 Hyperlipidemia, unspecified: Secondary | ICD-10-CM | POA: Diagnosis present

## 2015-08-14 DIAGNOSIS — E872 Acidosis: Secondary | ICD-10-CM | POA: Diagnosis present

## 2015-08-14 DIAGNOSIS — Z8507 Personal history of malignant neoplasm of pancreas: Secondary | ICD-10-CM

## 2015-08-14 DIAGNOSIS — Z7982 Long term (current) use of aspirin: Secondary | ICD-10-CM

## 2015-08-14 DIAGNOSIS — Z96652 Presence of left artificial knee joint: Secondary | ICD-10-CM | POA: Diagnosis present

## 2015-08-14 DIAGNOSIS — S82841A Displaced bimalleolar fracture of right lower leg, initial encounter for closed fracture: Principal | ICD-10-CM | POA: Diagnosis present

## 2015-08-14 DIAGNOSIS — T450X5A Adverse effect of antiallergic and antiemetic drugs, initial encounter: Secondary | ICD-10-CM | POA: Diagnosis present

## 2015-08-14 DIAGNOSIS — K219 Gastro-esophageal reflux disease without esophagitis: Secondary | ICD-10-CM | POA: Diagnosis present

## 2015-08-14 DIAGNOSIS — S8992XA Unspecified injury of left lower leg, initial encounter: Secondary | ICD-10-CM | POA: Diagnosis not present

## 2015-08-14 DIAGNOSIS — S8261XA Displaced fracture of lateral malleolus of right fibula, initial encounter for closed fracture: Secondary | ICD-10-CM | POA: Diagnosis not present

## 2015-08-14 DIAGNOSIS — M25571 Pain in right ankle and joints of right foot: Secondary | ICD-10-CM | POA: Diagnosis not present

## 2015-08-14 DIAGNOSIS — S8251XA Displaced fracture of medial malleolus of right tibia, initial encounter for closed fracture: Secondary | ICD-10-CM | POA: Diagnosis not present

## 2015-08-14 DIAGNOSIS — Z79899 Other long term (current) drug therapy: Secondary | ICD-10-CM

## 2015-08-14 DIAGNOSIS — Z981 Arthrodesis status: Secondary | ICD-10-CM

## 2015-08-14 DIAGNOSIS — F329 Major depressive disorder, single episode, unspecified: Secondary | ICD-10-CM | POA: Diagnosis present

## 2015-08-14 DIAGNOSIS — M810 Age-related osteoporosis without current pathological fracture: Secondary | ICD-10-CM | POA: Diagnosis present

## 2015-08-14 DIAGNOSIS — Z8673 Personal history of transient ischemic attack (TIA), and cerebral infarction without residual deficits: Secondary | ICD-10-CM

## 2015-08-14 MED ORDER — TECHNETIUM TC 99M MEDRONATE IV KIT
26.4000 | PACK | Freq: Once | INTRAVENOUS | Status: AC | PRN
  Administered 2015-08-14: 26.4 via INTRAVENOUS

## 2015-08-15 NOTE — Progress Notes (Signed)
This encounter was created in error - please disregard.

## 2015-08-16 ENCOUNTER — Emergency Department (HOSPITAL_COMMUNITY): Payer: Medicare Other

## 2015-08-16 ENCOUNTER — Inpatient Hospital Stay (HOSPITAL_COMMUNITY): Payer: Medicare Other

## 2015-08-16 ENCOUNTER — Inpatient Hospital Stay (HOSPITAL_COMMUNITY): Payer: Medicare Other | Admitting: Anesthesiology

## 2015-08-16 ENCOUNTER — Inpatient Hospital Stay (HOSPITAL_COMMUNITY)
Admission: EM | Admit: 2015-08-16 | Discharge: 2015-08-19 | DRG: 493 | Disposition: A | Discharge status: Skilled Nursing Facility | Payer: Medicare Other | Attending: Family Medicine | Admitting: Family Medicine

## 2015-08-16 ENCOUNTER — Encounter (HOSPITAL_COMMUNITY)
Admission: EM | Disposition: A | Discharge status: Skilled Nursing Facility | Payer: Self-pay | Source: Home / Self Care | Attending: Family Medicine

## 2015-08-16 ENCOUNTER — Encounter (HOSPITAL_COMMUNITY): Payer: Self-pay | Admitting: Emergency Medicine

## 2015-08-16 DIAGNOSIS — S82841A Displaced bimalleolar fracture of right lower leg, initial encounter for closed fracture: Secondary | ICD-10-CM | POA: Diagnosis not present

## 2015-08-16 DIAGNOSIS — T148XXA Other injury of unspecified body region, initial encounter: Secondary | ICD-10-CM

## 2015-08-16 DIAGNOSIS — G8929 Other chronic pain: Secondary | ICD-10-CM | POA: Diagnosis not present

## 2015-08-16 DIAGNOSIS — T450X5A Adverse effect of antiallergic and antiemetic drugs, initial encounter: Secondary | ICD-10-CM | POA: Diagnosis present

## 2015-08-16 DIAGNOSIS — E872 Acidosis: Secondary | ICD-10-CM | POA: Diagnosis present

## 2015-08-16 DIAGNOSIS — R278 Other lack of coordination: Secondary | ICD-10-CM | POA: Diagnosis not present

## 2015-08-16 DIAGNOSIS — R42 Dizziness and giddiness: Secondary | ICD-10-CM | POA: Diagnosis not present

## 2015-08-16 DIAGNOSIS — E039 Hypothyroidism, unspecified: Secondary | ICD-10-CM | POA: Diagnosis present

## 2015-08-16 DIAGNOSIS — K8681 Exocrine pancreatic insufficiency: Secondary | ICD-10-CM | POA: Diagnosis not present

## 2015-08-16 DIAGNOSIS — K6289 Other specified diseases of anus and rectum: Secondary | ICD-10-CM | POA: Diagnosis not present

## 2015-08-16 DIAGNOSIS — S82843A Displaced bimalleolar fracture of unspecified lower leg, initial encounter for closed fracture: Secondary | ICD-10-CM | POA: Diagnosis present

## 2015-08-16 DIAGNOSIS — H04129 Dry eye syndrome of unspecified lacrimal gland: Secondary | ICD-10-CM | POA: Diagnosis not present

## 2015-08-16 DIAGNOSIS — K219 Gastro-esophageal reflux disease without esophagitis: Secondary | ICD-10-CM | POA: Diagnosis present

## 2015-08-16 DIAGNOSIS — M9721XA Periprosthetic fracture around internal prosthetic right ankle joint, initial encounter: Secondary | ICD-10-CM | POA: Diagnosis not present

## 2015-08-16 DIAGNOSIS — Z981 Arthrodesis status: Secondary | ICD-10-CM | POA: Diagnosis not present

## 2015-08-16 DIAGNOSIS — F329 Major depressive disorder, single episode, unspecified: Secondary | ICD-10-CM | POA: Diagnosis present

## 2015-08-16 DIAGNOSIS — Z9181 History of falling: Secondary | ICD-10-CM | POA: Diagnosis not present

## 2015-08-16 DIAGNOSIS — S82891A Other fracture of right lower leg, initial encounter for closed fracture: Secondary | ICD-10-CM

## 2015-08-16 DIAGNOSIS — K5900 Constipation, unspecified: Secondary | ICD-10-CM | POA: Diagnosis not present

## 2015-08-16 DIAGNOSIS — Z803 Family history of malignant neoplasm of breast: Secondary | ICD-10-CM | POA: Diagnosis not present

## 2015-08-16 DIAGNOSIS — R259 Unspecified abnormal involuntary movements: Secondary | ICD-10-CM | POA: Diagnosis not present

## 2015-08-16 DIAGNOSIS — R262 Difficulty in walking, not elsewhere classified: Secondary | ICD-10-CM | POA: Diagnosis not present

## 2015-08-16 DIAGNOSIS — S82899S Other fracture of unspecified lower leg, sequela: Secondary | ICD-10-CM | POA: Diagnosis not present

## 2015-08-16 DIAGNOSIS — M79606 Pain in leg, unspecified: Secondary | ICD-10-CM | POA: Diagnosis not present

## 2015-08-16 DIAGNOSIS — Z79899 Other long term (current) drug therapy: Secondary | ICD-10-CM | POA: Diagnosis not present

## 2015-08-16 DIAGNOSIS — M25572 Pain in left ankle and joints of left foot: Secondary | ICD-10-CM | POA: Diagnosis not present

## 2015-08-16 DIAGNOSIS — R948 Abnormal results of function studies of other organs and systems: Secondary | ICD-10-CM | POA: Diagnosis present

## 2015-08-16 DIAGNOSIS — S8261XA Displaced fracture of lateral malleolus of right fibula, initial encounter for closed fracture: Secondary | ICD-10-CM | POA: Diagnosis not present

## 2015-08-16 DIAGNOSIS — G8911 Acute pain due to trauma: Secondary | ICD-10-CM | POA: Diagnosis not present

## 2015-08-16 DIAGNOSIS — E785 Hyperlipidemia, unspecified: Secondary | ICD-10-CM | POA: Diagnosis present

## 2015-08-16 DIAGNOSIS — I10 Essential (primary) hypertension: Secondary | ICD-10-CM | POA: Diagnosis not present

## 2015-08-16 DIAGNOSIS — G2401 Drug induced subacute dyskinesia: Secondary | ICD-10-CM | POA: Diagnosis present

## 2015-08-16 DIAGNOSIS — Z7982 Long term (current) use of aspirin: Secondary | ICD-10-CM | POA: Diagnosis not present

## 2015-08-16 DIAGNOSIS — W08XXXA Fall from other furniture, initial encounter: Secondary | ICD-10-CM | POA: Diagnosis present

## 2015-08-16 DIAGNOSIS — S82899A Other fracture of unspecified lower leg, initial encounter for closed fracture: Secondary | ICD-10-CM | POA: Diagnosis not present

## 2015-08-16 DIAGNOSIS — Z8507 Personal history of malignant neoplasm of pancreas: Secondary | ICD-10-CM | POA: Diagnosis not present

## 2015-08-16 DIAGNOSIS — Z820 Family history of epilepsy and other diseases of the nervous system: Secondary | ICD-10-CM | POA: Diagnosis not present

## 2015-08-16 DIAGNOSIS — G244 Idiopathic orofacial dystonia: Secondary | ICD-10-CM | POA: Diagnosis present

## 2015-08-16 DIAGNOSIS — S8251XA Displaced fracture of medial malleolus of right tibia, initial encounter for closed fracture: Secondary | ICD-10-CM | POA: Diagnosis not present

## 2015-08-16 DIAGNOSIS — S82841D Displaced bimalleolar fracture of right lower leg, subsequent encounter for closed fracture with routine healing: Secondary | ICD-10-CM | POA: Diagnosis not present

## 2015-08-16 DIAGNOSIS — S82841S Displaced bimalleolar fracture of right lower leg, sequela: Secondary | ICD-10-CM | POA: Diagnosis not present

## 2015-08-16 DIAGNOSIS — J302 Other seasonal allergic rhinitis: Secondary | ICD-10-CM | POA: Diagnosis not present

## 2015-08-16 DIAGNOSIS — R2689 Other abnormalities of gait and mobility: Secondary | ICD-10-CM | POA: Diagnosis not present

## 2015-08-16 DIAGNOSIS — M6281 Muscle weakness (generalized): Secondary | ICD-10-CM | POA: Diagnosis not present

## 2015-08-16 DIAGNOSIS — E639 Nutritional deficiency, unspecified: Secondary | ICD-10-CM | POA: Diagnosis not present

## 2015-08-16 DIAGNOSIS — E569 Vitamin deficiency, unspecified: Secondary | ICD-10-CM | POA: Diagnosis not present

## 2015-08-16 DIAGNOSIS — M62838 Other muscle spasm: Secondary | ICD-10-CM | POA: Diagnosis not present

## 2015-08-16 DIAGNOSIS — I341 Nonrheumatic mitral (valve) prolapse: Secondary | ICD-10-CM | POA: Diagnosis present

## 2015-08-16 DIAGNOSIS — R110 Nausea: Secondary | ICD-10-CM | POA: Diagnosis not present

## 2015-08-16 DIAGNOSIS — M810 Age-related osteoporosis without current pathological fracture: Secondary | ICD-10-CM | POA: Diagnosis present

## 2015-08-16 DIAGNOSIS — Z8673 Personal history of transient ischemic attack (TIA), and cerebral infarction without residual deficits: Secondary | ICD-10-CM | POA: Diagnosis not present

## 2015-08-16 DIAGNOSIS — T1490 Injury, unspecified: Secondary | ICD-10-CM | POA: Diagnosis not present

## 2015-08-16 DIAGNOSIS — M25571 Pain in right ankle and joints of right foot: Secondary | ICD-10-CM | POA: Diagnosis not present

## 2015-08-16 DIAGNOSIS — T148 Other injury of unspecified body region: Secondary | ICD-10-CM | POA: Diagnosis not present

## 2015-08-16 DIAGNOSIS — Z96652 Presence of left artificial knee joint: Secondary | ICD-10-CM | POA: Diagnosis present

## 2015-08-16 DIAGNOSIS — G8918 Other acute postprocedural pain: Secondary | ICD-10-CM | POA: Diagnosis not present

## 2015-08-16 HISTORY — DX: Displaced bimalleolar fracture of unspecified lower leg, initial encounter for closed fracture: S82.843A

## 2015-08-16 HISTORY — PX: ORIF ANKLE FRACTURE: SHX5408

## 2015-08-16 LAB — SURGICAL PCR SCREEN
MRSA, PCR: NEGATIVE
STAPHYLOCOCCUS AUREUS: NEGATIVE

## 2015-08-16 LAB — BASIC METABOLIC PANEL
ANION GAP: 12 (ref 5–15)
BUN: 13 mg/dL (ref 6–20)
CO2: 28 mmol/L (ref 22–32)
Calcium: 8.6 mg/dL — ABNORMAL LOW (ref 8.9–10.3)
Chloride: 103 mmol/L (ref 101–111)
Creatinine, Ser: 0.85 mg/dL (ref 0.44–1.00)
GFR calc Af Amer: >60 mL/min (ref >60)
GLUCOSE: 103 mg/dL — AB (ref 65–99)
POTASSIUM: 4 mmol/L (ref 3.5–5.1)
Sodium: 143 mmol/L (ref 135–145)

## 2015-08-16 LAB — CBC WITH DIFFERENTIAL/PLATELET
BASOS ABS: 0 10*3/uL (ref 0.0–0.1)
BASOS PCT: 0 %
Eosinophils Absolute: 0.4 10*3/uL (ref 0.0–0.7)
Eosinophils Relative: 4 %
HEMATOCRIT: 36.9 % (ref 36.0–46.0)
HEMOGLOBIN: 12 g/dL (ref 12.0–15.0)
LYMPHS PCT: 23 %
Lymphs Abs: 1.9 10*3/uL (ref 0.7–4.0)
MCH: 31.7 pg (ref 26.0–34.0)
MCHC: 32.5 g/dL (ref 30.0–36.0)
MCV: 97.6 fL (ref 78.0–100.0)
MONO ABS: 0.7 10*3/uL (ref 0.1–1.0)
Monocytes Relative: 9 %
NEUTROS ABS: 5.4 10*3/uL (ref 1.7–7.7)
NEUTROS PCT: 64 %
Platelets: 179 10*3/uL (ref 150–400)
RBC: 3.78 MIL/uL — ABNORMAL LOW (ref 3.87–5.11)
RDW: 14.7 % (ref 11.5–15.5)
WBC: 8.4 10*3/uL (ref 4.0–10.5)

## 2015-08-16 LAB — LACTIC ACID, PLASMA: Lactic Acid, Venous: 2.1 mmol/L (ref 0.5–2.0)

## 2015-08-16 LAB — I-STAT CG4 LACTIC ACID, ED: Lactic Acid, Venous: 2.13 mmol/L (ref 0.5–2.0)

## 2015-08-16 SURGERY — OPEN REDUCTION INTERNAL FIXATION (ORIF) ANKLE FRACTURE
Anesthesia: Regional | Site: Ankle | Laterality: Right

## 2015-08-16 MED ORDER — FENTANYL CITRATE (PF) 100 MCG/2ML IJ SOLN: 25.0000 ug | INTRAMUSCULAR | Status: DC | PRN

## 2015-08-16 MED ORDER — CEFAZOLIN SODIUM 1 G IJ SOLR
INTRAMUSCULAR | Status: AC
  Filled 2015-08-16: qty 20

## 2015-08-16 MED ORDER — VITAMIN B-12 1000 MCG PO TABS
2000.0000 ug | ORAL_TABLET | Freq: Every day | ORAL | Status: DC
Start: 2015-08-17 — End: 2015-08-19
  Administered 2015-08-17 – 2015-08-19 (×3): 2000 ug via ORAL
  Filled 2015-08-16 (×3): qty 2

## 2015-08-16 MED ORDER — DOXYCYCLINE HYCLATE 100 MG PO TABS
100.0000 mg | ORAL_TABLET | Freq: Once | ORAL | Status: AC
  Administered 2015-08-16: 100 mg via ORAL
  Filled 2015-08-16: qty 1

## 2015-08-16 MED ORDER — PANCRELIPASE (LIP-PROT-AMYL) 12000-38000 UNITS PO CPEP
24000.0000 [IU] | ORAL_CAPSULE | Freq: Three times a day (TID) | ORAL | Status: DC
  Administered 2015-08-17 – 2015-08-19 (×6): 24000 [IU] via ORAL
  Filled 2015-08-16 (×13): qty 2

## 2015-08-16 MED ORDER — SUGAMMADEX SODIUM 200 MG/2ML IV SOLN
INTRAVENOUS | Status: AC
  Filled 2015-08-16: qty 2

## 2015-08-16 MED ORDER — BOOST HIGH PROTEIN PO LIQD
1.0000 | Freq: Every day | ORAL | Status: DC
Start: 2015-08-16 — End: 2015-08-19
  Administered 2015-08-17 – 2015-08-18 (×2): 237 mL via ORAL
  Filled 2015-08-16 (×6): qty 237

## 2015-08-16 MED ORDER — LEVOTHYROXINE SODIUM 75 MCG PO TABS
175.0000 ug | ORAL_TABLET | Freq: Every day | ORAL | Status: DC
  Administered 2015-08-17 – 2015-08-19 (×3): 175 ug via ORAL
  Filled 2015-08-16 (×4): qty 1

## 2015-08-16 MED ORDER — ASPIRIN EC 81 MG PO TBEC: 81.0000 mg | DELAYED_RELEASE_TABLET | Freq: Every day | ORAL | Status: DC

## 2015-08-16 MED ORDER — ACETAMINOPHEN 650 MG RE SUPP: 650.0000 mg | Freq: Four times a day (QID) | RECTAL | Status: DC | PRN

## 2015-08-16 MED ORDER — ONDANSETRON HCL 4 MG/2ML IJ SOLN: 4.0000 mg | Freq: Four times a day (QID) | INTRAMUSCULAR | Status: DC | PRN

## 2015-08-16 MED ORDER — POTASSIUM CHLORIDE ER 10 MEQ PO TBCR
10.0000 meq | EXTENDED_RELEASE_TABLET | Freq: Every day | ORAL | Status: DC
  Administered 2015-08-16 – 2015-08-19 (×4): 10 meq via ORAL
  Filled 2015-08-16 (×11): qty 1

## 2015-08-16 MED ORDER — SODIUM CHLORIDE 0.9 % IV BOLUS (SEPSIS)
1000.0000 mL | Freq: Once | INTRAVENOUS | Status: AC
  Administered 2015-08-16: 1000 mL via INTRAVENOUS

## 2015-08-16 MED ORDER — SUGAMMADEX SODIUM 200 MG/2ML IV SOLN
INTRAVENOUS | Status: DC | PRN
  Administered 2015-08-16: 200 mg via INTRAVENOUS

## 2015-08-16 MED ORDER — PHENYLEPHRINE 40 MCG/ML (10ML) SYRINGE FOR IV PUSH (FOR BLOOD PRESSURE SUPPORT)
PREFILLED_SYRINGE | INTRAVENOUS | Status: AC
  Filled 2015-08-16: qty 10

## 2015-08-16 MED ORDER — LIDOCAINE HCL (CARDIAC) 20 MG/ML IV SOLN
INTRAVENOUS | Status: AC
  Filled 2015-08-16: qty 5

## 2015-08-16 MED ORDER — MIDAZOLAM HCL 2 MG/2ML IJ SOLN
INTRAMUSCULAR | Status: AC
  Filled 2015-08-16: qty 2

## 2015-08-16 MED ORDER — ROCURONIUM BROMIDE 50 MG/5ML IV SOLN
INTRAVENOUS | Status: AC
  Filled 2015-08-16: qty 1

## 2015-08-16 MED ORDER — ROCURONIUM BROMIDE 100 MG/10ML IV SOLN
INTRAVENOUS | Status: DC | PRN
  Administered 2015-08-16: 40 mg via INTRAVENOUS

## 2015-08-16 MED ORDER — PROPOFOL 10 MG/ML IV BOLUS
INTRAVENOUS | Status: DC | PRN
  Administered 2015-08-16: 80 mg via INTRAVENOUS

## 2015-08-16 MED ORDER — ROPINIROLE HCL 0.5 MG PO TABS
0.5000 mg | ORAL_TABLET | Freq: Every day | ORAL | Status: DC
  Administered 2015-08-16 – 2015-08-18 (×3): 0.5 mg via ORAL
  Filled 2015-08-16 (×3): qty 1

## 2015-08-16 MED ORDER — FENTANYL CITRATE (PF) 250 MCG/5ML IJ SOLN
INTRAMUSCULAR | Status: DC | PRN
  Administered 2015-08-16 (×2): 25 ug via INTRAVENOUS

## 2015-08-16 MED ORDER — ACETAMINOPHEN 325 MG PO TABS
650.0000 mg | ORAL_TABLET | Freq: Four times a day (QID) | ORAL | Status: DC | PRN
  Administered 2015-08-18: 650 mg via ORAL
  Filled 2015-08-16: qty 2

## 2015-08-16 MED ORDER — METOCLOPRAMIDE HCL 5 MG PO TABS: 5.0000 mg | ORAL_TABLET | Freq: Three times a day (TID) | ORAL | Status: DC | PRN

## 2015-08-16 MED ORDER — 0.9 % SODIUM CHLORIDE (POUR BTL) OPTIME
TOPICAL | Status: DC | PRN
  Administered 2015-08-16: 4000 mL

## 2015-08-16 MED ORDER — BUPIVACAINE HCL (PF) 0.5 % IJ SOLN
INTRAMUSCULAR | Status: DC | PRN
  Administered 2015-08-16: 8 mL

## 2015-08-16 MED ORDER — OXCARBAZEPINE 150 MG PO TABS
150.0000 mg | ORAL_TABLET | Freq: Two times a day (BID) | ORAL | Status: DC
  Administered 2015-08-16 – 2015-08-19 (×7): 150 mg via ORAL
  Filled 2015-08-16 (×11): qty 1

## 2015-08-16 MED ORDER — FUROSEMIDE 20 MG PO TABS
20.0000 mg | ORAL_TABLET | Freq: Every day | ORAL | Status: DC
  Administered 2015-08-16 – 2015-08-19 (×4): 20 mg via ORAL
  Filled 2015-08-16 (×4): qty 1

## 2015-08-16 MED ORDER — PANTOPRAZOLE SODIUM 40 MG PO TBEC
40.0000 mg | DELAYED_RELEASE_TABLET | Freq: Two times a day (BID) | ORAL | Status: DC
  Administered 2015-08-16 – 2015-08-19 (×7): 40 mg via ORAL
  Filled 2015-08-16 (×7): qty 1

## 2015-08-16 MED ORDER — GABAPENTIN 400 MG PO CAPS
800.0000 mg | ORAL_CAPSULE | Freq: Three times a day (TID) | ORAL | Status: DC
  Administered 2015-08-16 – 2015-08-19 (×9): 800 mg via ORAL
  Filled 2015-08-16 (×11): qty 2

## 2015-08-16 MED ORDER — PRAVASTATIN SODIUM 40 MG PO TABS
40.0000 mg | ORAL_TABLET | Freq: Every day | ORAL | Status: DC
  Administered 2015-08-17 – 2015-08-18 (×2): 40 mg via ORAL
  Filled 2015-08-16 (×2): qty 1

## 2015-08-16 MED ORDER — CEFAZOLIN SODIUM-DEXTROSE 2-3 GM-% IV SOLR
2.0000 g | Freq: Four times a day (QID) | INTRAVENOUS | Status: AC
  Administered 2015-08-16 – 2015-08-17 (×2): 2 g via INTRAVENOUS
  Filled 2015-08-16 (×2): qty 50

## 2015-08-16 MED ORDER — HEPARIN SODIUM (PORCINE) 5000 UNIT/ML IJ SOLN: 5000.0000 [IU] | Freq: Three times a day (TID) | INTRAMUSCULAR | Status: DC

## 2015-08-16 MED ORDER — OMEPRAZOLE MAGNESIUM 20 MG PO TBEC: 20.0000 mg | DELAYED_RELEASE_TABLET | Freq: Two times a day (BID) | ORAL | Status: DC

## 2015-08-16 MED ORDER — HYDROMORPHONE HCL 1 MG/ML IJ SOLN
2.0000 mg | Freq: Once | INTRAMUSCULAR | Status: AC
  Administered 2015-08-16: 2 mg via INTRAMUSCULAR
  Filled 2015-08-16: qty 2

## 2015-08-16 MED ORDER — METOCLOPRAMIDE HCL 5 MG/ML IJ SOLN: 5.0000 mg | Freq: Three times a day (TID) | INTRAMUSCULAR | Status: DC | PRN

## 2015-08-16 MED ORDER — SODIUM CHLORIDE 0.9 % IV SOLN
INTRAVENOUS | Status: DC
  Administered 2015-08-17: 19:00:00 via INTRAVENOUS

## 2015-08-16 MED ORDER — MIDAZOLAM HCL 5 MG/5ML IJ SOLN
INTRAMUSCULAR | Status: DC | PRN
  Administered 2015-08-16: 2 mg via INTRAVENOUS

## 2015-08-16 MED ORDER — BACLOFEN 10 MG PO TABS
10.0000 mg | ORAL_TABLET | Freq: Three times a day (TID) | ORAL | Status: DC
  Administered 2015-08-16 – 2015-08-19 (×9): 10 mg via ORAL
  Filled 2015-08-16 (×9): qty 1

## 2015-08-16 MED ORDER — FLUOXETINE HCL 20 MG PO CAPS
20.0000 mg | ORAL_CAPSULE | Freq: Every day | ORAL | Status: DC
  Administered 2015-08-16 – 2015-08-19 (×4): 20 mg via ORAL
  Filled 2015-08-16 (×5): qty 1

## 2015-08-16 MED ORDER — MORPHINE SULFATE (PF) 2 MG/ML IV SOLN
2.0000 mg | INTRAVENOUS | Status: DC | PRN
  Administered 2015-08-17 – 2015-08-19 (×10): 2 mg via INTRAVENOUS
  Filled 2015-08-16 (×10): qty 1

## 2015-08-16 MED ORDER — HYPROMELLOSE (GONIOSCOPIC) 2.5 % OP SOLN
1.0000 [drp] | Freq: Two times a day (BID) | OPHTHALMIC | Status: DC
  Administered 2015-08-17 – 2015-08-19 (×5): 1 [drp] via OPHTHALMIC
  Filled 2015-08-16 (×2): qty 15

## 2015-08-16 MED ORDER — DOCUSATE SODIUM 100 MG PO CAPS
200.0000 mg | ORAL_CAPSULE | Freq: Every day | ORAL | Status: DC
  Administered 2015-08-16 – 2015-08-18 (×3): 200 mg via ORAL
  Filled 2015-08-16 (×3): qty 2

## 2015-08-16 MED ORDER — ALBUTEROL SULFATE (2.5 MG/3ML) 0.083% IN NEBU: 2.5000 mg | INHALATION_SOLUTION | RESPIRATORY_TRACT | Status: DC | PRN

## 2015-08-16 MED ORDER — LACTATED RINGERS IV SOLN
INTRAVENOUS | Status: DC | PRN
  Administered 2015-08-16 (×2): via INTRAVENOUS

## 2015-08-16 MED ORDER — CEPHALEXIN 250 MG PO CAPS
500.0000 mg | ORAL_CAPSULE | Freq: Once | ORAL | Status: AC
  Administered 2015-08-16: 500 mg via ORAL
  Filled 2015-08-16: qty 2

## 2015-08-16 MED ORDER — BUPIVACAINE-EPINEPHRINE (PF) 0.5% -1:200000 IJ SOLN
INTRAMUSCULAR | Status: DC | PRN
  Administered 2015-08-16: 30 mL via PERINEURAL

## 2015-08-16 MED ORDER — PHENYLEPHRINE HCL 10 MG/ML IJ SOLN
INTRAMUSCULAR | Status: DC | PRN
  Administered 2015-08-16: 80 ug via INTRAVENOUS

## 2015-08-16 MED ORDER — HYDROCORTISONE 1 % EX CREA
1.0000 "application " | TOPICAL_CREAM | Freq: Two times a day (BID) | CUTANEOUS | Status: DC
  Administered 2015-08-18 – 2015-08-19 (×2): 1 via TOPICAL
  Filled 2015-08-16 (×2): qty 28

## 2015-08-16 MED ORDER — ATENOLOL 50 MG PO TABS
25.0000 mg | ORAL_TABLET | Freq: Every day | ORAL | Status: DC
  Administered 2015-08-16 – 2015-08-19 (×4): 25 mg via ORAL
  Filled 2015-08-16 (×4): qty 1

## 2015-08-16 MED ORDER — ONDANSETRON HCL 4 MG PO TABS: 4.0000 mg | ORAL_TABLET | Freq: Four times a day (QID) | ORAL | Status: DC | PRN

## 2015-08-16 MED ORDER — ACETAMINOPHEN 650 MG RE SUPP
650.0000 mg | Freq: Four times a day (QID) | RECTAL | Status: DC | PRN
Start: 2015-08-16 — End: 2015-08-19

## 2015-08-16 MED ORDER — HYDROCODONE-ACETAMINOPHEN 5-325 MG PO TABS
1.0000 | ORAL_TABLET | ORAL | Status: DC | PRN
  Administered 2015-08-16 – 2015-08-19 (×16): 1 via ORAL
  Filled 2015-08-16 (×16): qty 1

## 2015-08-16 MED ORDER — CEFAZOLIN SODIUM-DEXTROSE 2-3 GM-% IV SOLR
INTRAVENOUS | Status: DC | PRN
  Administered 2015-08-16: 2 g via INTRAVENOUS

## 2015-08-16 MED ORDER — ACETAMINOPHEN 325 MG PO TABS: 650.0000 mg | ORAL_TABLET | Freq: Four times a day (QID) | ORAL | Status: DC | PRN

## 2015-08-16 MED ORDER — LIDOCAINE HCL (CARDIAC) 20 MG/ML IV SOLN
INTRAVENOUS | Status: DC | PRN
  Administered 2015-08-16: 20 mg via INTRATRACHEAL

## 2015-08-16 MED ORDER — FENTANYL CITRATE (PF) 250 MCG/5ML IJ SOLN
INTRAMUSCULAR | Status: AC
  Filled 2015-08-16: qty 5

## 2015-08-16 MED ORDER — PROPOFOL 10 MG/ML IV BOLUS
INTRAVENOUS | Status: AC
  Filled 2015-08-16: qty 20

## 2015-08-16 MED ORDER — LORATADINE 10 MG PO TABS
10.0000 mg | ORAL_TABLET | Freq: Every day | ORAL | Status: DC
  Administered 2015-08-16 – 2015-08-19 (×4): 10 mg via ORAL
  Filled 2015-08-16 (×4): qty 1

## 2015-08-16 SURGICAL SUPPLY — 79 items
BANDAGE ELASTIC 4 VELCRO ST LF (GAUZE/BANDAGES/DRESSINGS) IMPLANT
BANDAGE ELASTIC 6 VELCRO ST LF (GAUZE/BANDAGES/DRESSINGS) IMPLANT
BIT DRILL 2.7 QC CANN 155 (BIT) ×4 IMPLANT
BIT DRILL 2.7 QC CANN 155MM (BIT) ×2
BIT DRILL 3.5 QC 155 (BIT) ×2 IMPLANT
BIT DRILL 3.5 QC 155MM (BIT) ×1
BIT DRILL QC 2.7 6.3IN  SHORT (BIT) ×2
BIT DRILL QC 2.7 6.3IN SHORT (BIT) ×1 IMPLANT
BLADE SURG 10 STRL SS (BLADE) IMPLANT
BLADE SURG 15 STRL LF DISP TIS (BLADE) ×1 IMPLANT
BLADE SURG 15 STRL SS (BLADE) ×2
BNDG COHESIVE 6X5 TAN STRL LF (GAUZE/BANDAGES/DRESSINGS) IMPLANT
BNDG ESMARK 4X9 LF (GAUZE/BANDAGES/DRESSINGS) ×3 IMPLANT
BNDG GAUZE ELAST 4 BULKY (GAUZE/BANDAGES/DRESSINGS) IMPLANT
COVER MAYO STAND STRL (DRAPES) ×3 IMPLANT
COVER SURGICAL LIGHT HANDLE (MISCELLANEOUS) ×3 IMPLANT
CUFF TOURNIQUET SINGLE 34IN LL (TOURNIQUET CUFF) IMPLANT
CUFF TOURNIQUET SINGLE 44IN (TOURNIQUET CUFF) IMPLANT
DRAPE C-ARM 42X72 X-RAY (DRAPES) ×3 IMPLANT
DRAPE C-ARMOR (DRAPES) ×3 IMPLANT
DRAPE INCISE IOBAN 66X45 STRL (DRAPES) ×3 IMPLANT
DRAPE SURG 17X23 STRL (DRAPES) IMPLANT
DRAPE U-SHAPE 47X51 STRL (DRAPES) ×3 IMPLANT
DRSG PAD ABDOMINAL 8X10 ST (GAUZE/BANDAGES/DRESSINGS) ×15 IMPLANT
DURAPREP 26ML APPLICATOR (WOUND CARE) ×3 IMPLANT
ELECT REM PT RETURN 9FT ADLT (ELECTROSURGICAL) ×3
ELECTRODE REM PT RTRN 9FT ADLT (ELECTROSURGICAL) ×1 IMPLANT
FACESHIELD WRAPAROUND (MASK) IMPLANT
GAUZE SPONGE 4X4 12PLY STRL (GAUZE/BANDAGES/DRESSINGS) IMPLANT
GAUZE XEROFORM 5X9 LF (GAUZE/BANDAGES/DRESSINGS) ×3 IMPLANT
GLOVE BIOGEL PI IND STRL 8 (GLOVE) ×2 IMPLANT
GLOVE BIOGEL PI INDICATOR 8 (GLOVE) ×4
GLOVE SURG ORTHO 8.0 STRL STRW (GLOVE) ×3 IMPLANT
GOWN STRL REUS W/ TWL LRG LVL3 (GOWN DISPOSABLE) ×3 IMPLANT
GOWN STRL REUS W/TWL LRG LVL3 (GOWN DISPOSABLE) ×6
GUIDE PIN 1.3 (Pin) ×6 IMPLANT
HANDPIECE INTERPULSE COAX TIP (DISPOSABLE)
KIT BASIN OR (CUSTOM PROCEDURE TRAY) ×6 IMPLANT
KIT ROOM TURNOVER OR (KITS) ×3 IMPLANT
MANIFOLD NEPTUNE II (INSTRUMENTS) ×3 IMPLANT
NEEDLE HYPO 25GX1X1/2 BEV (NEEDLE) IMPLANT
NS IRRIG 1000ML POUR BTL (IV SOLUTION) ×3 IMPLANT
PACK ORTHO EXTREMITY (CUSTOM PROCEDURE TRAY) ×3 IMPLANT
PAD ARMBOARD 7.5X6 YLW CONV (MISCELLANEOUS) ×6 IMPLANT
PAD CAST 4YDX4 CTTN HI CHSV (CAST SUPPLIES) ×1 IMPLANT
PADDING CAST COTTON 4X4 STRL (CAST SUPPLIES) ×2
PIN GUIDE 1.3 (Pin) ×2 IMPLANT
PLATE FIBULA DISTAL 7 HOLE (Plate) ×3 IMPLANT
SCREW 5.0X12 (Screw) ×3 IMPLANT
SCREW CANNULATED 4.0X42 (Screw) ×3 IMPLANT
SCREW LOCK 10X3.5XST NS (Screw) ×1 IMPLANT
SCREW LOCK 12X3.5XST PRLC (Screw) ×2 IMPLANT
SCREW LOCK 3.5X10 (Screw) ×2 IMPLANT
SCREW LOCK 3.5X12 (Screw) ×4 IMPLANT
SCREW LOCK 3.5X14 (Screw) ×6 IMPLANT
SCREW NL 3.5X14 (Screw) ×3 IMPLANT
SCREW NON LOCK 3.5X12 (Screw) ×9 IMPLANT
SCREW NONLOCK 3.5X10 (Screw) ×3 IMPLANT
SCREW NONLOCK 3.5X18 (Screw) ×3 IMPLANT
SET HNDPC FAN SPRY TIP SCT (DISPOSABLE) IMPLANT
SPLINT PLASTER CAST XFAST 5X30 (CAST SUPPLIES) ×1 IMPLANT
SPLINT PLASTER XFAST SET 5X30 (CAST SUPPLIES) ×2
SPONGE GAUZE 4X4 12PLY STER LF (GAUZE/BANDAGES/DRESSINGS) ×3 IMPLANT
STOCKINETTE IMPERVIOUS 9X36 MD (GAUZE/BANDAGES/DRESSINGS) ×3 IMPLANT
SUCTION FRAZIER HANDLE 10FR (MISCELLANEOUS) ×2
SUCTION TUBE FRAZIER 10FR DISP (MISCELLANEOUS) ×1 IMPLANT
SUT ETHILON 3 0 PS 1 (SUTURE) ×21 IMPLANT
SUT VIC AB 2-0 CTB1 (SUTURE) ×9 IMPLANT
SUT VIC AB 3-0 SH 27 (SUTURE)
SUT VIC AB 3-0 SH 27X BRD (SUTURE) IMPLANT
SYR CONTROL 10ML LL (SYRINGE) IMPLANT
TOWEL OR 17X24 6PK STRL BLUE (TOWEL DISPOSABLE) ×3 IMPLANT
TOWEL OR 17X26 10 PK STRL BLUE (TOWEL DISPOSABLE) ×3 IMPLANT
TUBE CONNECTING 12'X1/4 (SUCTIONS) ×1
TUBE CONNECTING 12X1/4 (SUCTIONS) ×2 IMPLANT
WASHER 4.0 (Washer) ×2 IMPLANT
WASHER ORTH THK1X8X4XSTRL (Washer) ×1 IMPLANT
WATER STERILE IRR 1000ML POUR (IV SOLUTION) ×3 IMPLANT
YANKAUER SUCT BULB TIP NO VENT (SUCTIONS) IMPLANT

## 2015-08-16 NOTE — ED Notes (Signed)
Med given clothes removed  pts sats are low from pain med   Nasal 02 at 2  Give bedpan  Iv nss infusing

## 2015-08-16 NOTE — ED Notes (Signed)
Called ortho tech to splint, stated en route

## 2015-08-16 NOTE — Anesthesia Procedure Notes (Addendum)
Anesthesia Regional Block:  Popliteal block  Pre-Anesthetic Checklist: ,, timeout performed, Correct Patient, Correct Site, Correct Laterality, Correct Procedure, Correct Position, site marked, Risks and benefits discussed, pre-op evaluation, post-op pain management  Laterality: Right  Prep: Maximum Sterile Barrier Precautions used and chloraprep       Needles:  Injection technique: Single-shot  Needle Type: Echogenic Stimulator Needle     Needle Length: 10cm 10 cm Needle Gauge: 21 and 21 G    Additional Needles:  Procedures: ultrasound guided (picture in chart) and nerve stimulator Popliteal block  Nerve Stimulator or Paresthesia:  Response: Peroneal,  Response: Tibial,   Additional Responses:   Narrative:  Start time: 08/16/2015 3:15 PM End time: 08/16/2015 3:30 PM Injection made incrementally with aspirations every 5 mL. Anesthesiologist: Roderic Palau  Additional Notes: 2% Lidocaine skin wheel. Saphenous block above the medial malleolus with 8cc of 0.5% Bupivicaine plain.   Procedure Name: Intubation Date/Time: 08/16/2015 3:58 PM Performed by: Maude Leriche D Pre-anesthesia Checklist: Patient identified, Emergency Drugs available, Suction available, Patient being monitored and Timeout performed Patient Re-evaluated:Patient Re-evaluated prior to inductionOxygen Delivery Method: Circle system utilized Preoxygenation: Pre-oxygenation with 100% oxygen Ventilation: Mask ventilation without difficulty and Oral airway inserted - appropriate to patient size Laryngoscope Size: Miller and 2 Grade View: Grade I Tube type: Oral Tube size: 7.5 mm Number of attempts: 1 Airway Equipment and Method: Stylet Placement Confirmation: ETT inserted through vocal cords under direct vision,  positive ETCO2 and breath sounds checked- equal and bilateral Secured at: 21 cm Tube secured with: Tape Dental Injury: Teeth and Oropharynx as per pre-operative assessment

## 2015-08-16 NOTE — ED Notes (Signed)
Pt voided in bedpan  Rt foot toes cap refill less than 3 secs

## 2015-08-16 NOTE — Consult Note (Signed)
Reason for Consult right ankle pain Referring Physician: Dr. Joselyn Arrow [name] is an 75 y.o. female.  HPI: [name] is a 75 year old female who lives alone with a recent history of multiple falls due to balance problems. She fell last night however sleep onto the right ankle. She does have a history of osteoporosis. Denies any loss of consciousness or any other orthopedic complaints but does report some right ankle pain. She has had some back pain with a long-standing problem. Patient is not a smoker. She is on vitamin D and calcium replacement.  Past Medical History  Diagnosis Date  . Osteoarthritis   . Hiatal hernia   . Chronic maxillary sinusitis     neti pot  . Depression     alone a lot  . Eustachian tube dysfunction   . Tardive dyskinesia     possibly reglan, vitamin E helps  . Mitral valve prolapse     antibiotics before dental procedures  . GERD (gastroesophageal reflux disease)   . Allergic rhinitis   . Glaucoma   . Hypothyroid   . Hypertension   . Heart murmur     hx. "MVP" -predental antibiotics  . Memory loss     short term memory loss  . Urgency of urination     some UTI in past  . Stroke [facility])     mini storkes left leg paraylsis. patient denies weakness 01/08/14.   . Cancer ([place]) 07/10/13    Pancreatic cancer with MRI scan 06-19-13    Past Surgical History  Procedure Laterality Date  . Knee surgery Left     x 5, total knee Left knee  . 1 baker cyst removed    . Dilation and curettage of uterus      x3  . Lumbar spine surgery      x2  . Abdominal hysterectomy      including ovaries  . Lumbar spine surgery      cyst  . Joint replacement      LTKA  . Blepharoplasty Bilateral     with cataract surgery  . Eye surgery Right     cataract  . Eus N/A 07/10/2013    Procedure: ESOPHAGEAL ENDOSCOPIC ULTRASOUND (EUS) RADIAL;  Surgeon: Arta Silence, MD;  [facility];  Service: Endoscopy;  Laterality: N/A;  . Fine needle aspiration N/A 07/10/2013     Procedure: FINE NEEDLE ASPIRATION (FNA) LINEAR;  Surgeon: Arta Silence, MD;  Location[facility];  Service: Endoscopy;  Laterality: N/A;  possible fna  . Back surgery      fusion  . Breast surgery      Biopsy left 2 times  . Colonoscopy w/ polypectomy      2004 last colonoscopy, no polyps  . Laparoscopy N/A 08/07/2013    Procedure: LAPAROSCOPY DIAGNOSTIC;  Surgeon: [name], MD;  Location: [place];  Service: General;  Laterality: N/A;  . Whipple procedure N/A 08/07/2013    Procedure: WHIPPLE PROCEDURE;  Surgeon: [name], MD;  Location: [place];  Service: General;  Laterality: N/A;  . Esophagogastroduodenoscopy N/A 09/11/2013    Procedure: ESOPHAGOGASTRODUODENOSCOPY (EGD);  Surgeon: Cleotis Nipper, MD;  Location: [facility] ENDOSCOPY;  Service: Endoscopy;  Laterality: N/A;  Moderate sedation okay if MAC not available    Family History  Problem Relation Age of Onset  . Cancer Mother     Breast Cancer with Metastatic disease  . Alzheimer's disease Father   . Other Brother 22    GSW  Social History:  reports that she has never smoked. She has never used smokeless tobacco. She reports that she does not drink alcohol or use illicit drugs.  Allergies:  Allergies  Allergen Reactions  . Other Other (See Comments)    According to patient Trilafor and Reglan cause Tardive Dyskinesia  . Metoclopramide Hcl Other (See Comments)    Shaking; caused tardive dyskinesia  . Niacin Other (See Comments)    shaking  . Trovan [Alatrofloxacin Mesylate] Other (See Comments)    Caused shaking and nervousness  . Nortriptyline Other (See Comments)    Dizziness   . Benzocaine-Resorcinol Rash  . Celecoxib Other (See Comments)    unknown  . Glucosamine Other (See Comments)    unknown  . Phenazopyridine Hcl Other (See Comments)    unknown  . Sulfa Antibiotics Nausea And Vomiting and Rash  . Sulfonamide Derivatives Nausea And Vomiting and Rash    Medications: I have reviewed the patient's current  medications.  Results for orders placed or performed during the hospital encounter of 08/16/15 (from the past 48 hour(s))  CBC with Differential/Platelet     Status: Abnormal   Collection Time: 08/16/15  3:10 AM  Result Value Ref Range   WBC 8.4 4.0 - 10.5 K/uL   RBC 3.78 (L) 3.87 - 5.11 MIL/uL   Hemoglobin 12.0 12.0 - 15.0 g/dL   HCT 36.9 36.0 - 46.0 %   MCV 97.6 78.0 - 100.0 fL   MCH 31.7 26.0 - 34.0 pg   MCHC 32.5 30.0 - 36.0 g/dL   RDW 14.7 11.5 - 15.5 %   Platelets 179 150 - 400 K/uL   Neutrophils Relative % 64 %   Neutro Abs 5.4 1.7 - 7.7 K/uL   Lymphocytes Relative 23 %   Lymphs Abs 1.9 0.7 - 4.0 K/uL   Monocytes Relative 9 %   Monocytes Absolute 0.7 0.1 - 1.0 K/uL   Eosinophils Relative 4 %   Eosinophils Absolute 0.4 0.0 - 0.7 K/uL   Basophils Relative 0 %   Basophils Absolute 0.0 0.0 - 0.1 K/uL  I-Stat CG4 Lactic Acid, ED     Status: Abnormal   Collection Time: 08/16/15  3:17 AM  Result Value Ref Range   Lactic Acid, Venous 2.13 (HH) 0.5 - 2.0 mmol/L   Comment NOTIFIED PHYSICIAN   Basic metabolic panel     Status: Abnormal   Collection Time: 08/16/15  6:54 AM  Result Value Ref Range   Sodium 143 135 - 145 mmol/L   Potassium 4.0 3.5 - 5.1 mmol/L   Chloride 103 101 - 111 mmol/L   CO2 28 22 - 32 mmol/L   Glucose, Bld 103 (H) 65 - 99 mg/dL   BUN 13 6 - 20 mg/dL   Creatinine, Ser 0.85 0.44 - 1.00 mg/dL   Calcium 8.6 (L) 8.9 - 10.3 mg/dL   GFR calc non Af Amer >60 >60 mL/min   GFR calc Af Amer >60 >60 mL/min    Comment: (NOTE) The eGFR has been calculated using the CKD EPI equation. This calculation has not been validated in all clinical situations. eGFR's persistently <60 mL/min signify possible Chronic Kidney Disease.    Anion gap 12 5 - 15  Lactic acid, plasma     Status: Abnormal   Collection Time: 08/16/15  7:02 AM  Result Value Ref Range   Lactic Acid, Venous 2.1 (HH) 0.5 - 2.0 mmol/L    Comment: CRITICAL RESULT CALLED TO, READ BACK BY AND VERIFIED  WITH: DAYETRN [id number] MCCAULEG     Dg Ankle Complete Right  08/16/2015  CLINICAL DATA:  Status post fall off couch while sleeping, with right ankle pain and swelling. Initial encounter. EXAM: RIGHT ANKLE - COMPLETE 3+ VIEW COMPARISON:  Right foot radiographs performed 05/09/2014 FINDINGS: There are mildly displaced medial and lateral malleolar fractures, and question of a tiny avulsion fracture at the posterior malleolus. There is mild medial widening of the ankle mortise, with mild lateral tilt. Diffuse soft tissue swelling is noted about the ankle. The joint spaces are preserved. No significant soft tissue abnormalities are seen. IMPRESSION: 1. Mildly displaced medial and lateral malleolar fractures, and question of a tiny avulsion fracture at the posterior malleolus. 2. Mild medial widening of the ankle mortise, with mild lateral tilt. Electronically Signed   By: Garald Balding M.D.   On: 08/16/2015 02:19   Nm Bone Scan 3 Phase  08/14/2015  CLINICAL DATA:  Trauma to left knee. History of left knee arthroplasty. EXAM: NUCLEAR MEDICINE 3-PHASE BONE SCAN TECHNIQUE: Radionuclide angiographic images, immediate static blood pool images, and 3-hour delayed static images were obtained of the knees after intravenous injection of radiopharmaceutical. RADIOPHARMACEUTICALS:  26.4 mCi Tc-49mMDP COMPARISON:  None. FINDINGS: Vascular phase: On the flow phase portion of the exam there is abnormal asymmetric increased uptake surrounding the left knee. Blood pool phase: On the blood pool phase portion of the examination there is abnormal asymmetric increased uptake surrounding the femoral component of the left knee arthroplasty device. Delayed phase: On the delayed phase images there is asymmetric increased uptake surrounding both the tibial and femoral components of the left knee arthroplasty device. IMPRESSION: Increased uptake on all 3 phases compatible with left knee arthroplasty device loosening or infection.  Electronically Signed   By: TKerby MoorsM.D.   On: 08/14/2015 16:54    Review of Systems  Constitutional: Negative.   HENT: Negative.   Eyes: Negative.   Respiratory: Negative.   Cardiovascular: Negative.   Gastrointestinal: Negative.   Genitourinary: Negative.   Musculoskeletal: Positive for joint pain.  Skin: Negative.   Neurological: Negative.   Endo/Heme/Allergies: Negative.   Psychiatric/Behavioral: Negative.    Blood pressure 162/67, pulse 91, temperature 99.3 F (37.4 C), temperature source Oral, resp. rate 18, height _0  (1.549 m), weight 73.029 kg (161 lb), SpO2 90 %. Physical Exam  Constitutional: She appears well-developed.  HENT:  Head: Normocephalic.  Eyes: Pupils are equal, round, and reactive to light.  Neck: Normal range of motion.  Cardiovascular: Normal rate.   Respiratory: Effort normal.  Neurological: She is alert.  Skin: Skin is warm.  Psychiatric: She has a normal mood and affect.   examination of the right leg demonstrates no knee effusion no groin pain with internal/external rotation of either leg right foot is splinted but she does have palpable pedal pulse as well as good toe dorsiflexion and plantarflexion compartments are soft sensation intact on the dorsal plantar aspect of the foot  Assessment/Plan: Radiographs demonstrated displaced bimalleolar ankle fracture on the right-hand side bone is somewhat osteopenic impression bilateral ankle fracture in an ambulatory patient who does live alone but has had a recent history of falls due to balance issues plan of reduction internal fixation of ankle fracture. Risks and benefits discussed with the patient and her son including not limited to infection or vessel damage nonunion malunion potential need for hardware removal as well as a period of prolonged nonweightbearing. This really depends on bone quality as well as  positive fixation. Anticipate skilled nursing facility stay for at least a couple of weeks  until she is more mobile. All QUESTIONS answered and we'll proceed with surgery today  Cheryll Keisler SCOTT 08/16/2015, 8:06 AM

## 2015-08-16 NOTE — ED Notes (Signed)
Pt fell off couch while sleeping and reports R ankle pain, moderate swelling noted. Pulses intact. Also reports lower back pain. A/OX4

## 2015-08-16 NOTE — Op Note (Signed)
NAMETONGIA, MUTCHLER NO.:  [id number]  MEDICAL RECORD NO.:  [id number]  LOCATION:  [facility]                         FACILITY:  [facility]  PHYSICIAN:  Anderson Malta, M.D.    DATE OF BIRTH:  Dec 27, 1940  DATE OF PROCEDURE: DATE OF DISCHARGE:                              OPERATIVE REPORT   PREOPERATIVE DIAGNOSIS:  Right bimalleolar ankle fracture.  POSTOPERATIVE DIAGNOSIS:  Right bimalleolar ankle fracture.  PROCEDURE:  Right bimalleolar ankle fracture open reduction and internal fixation.  SURGEON:  Anderson Malta, M.D.  ASSISTANT:  Benjiman Core, PA.  INDICATIONS:  [name] is a 75 year old ambulatory patient with right ankle fracture, presents for operative management after explanation of risks and benefits.  PROCEDURE IN DETAIL:  The patient was brought to the operating room where general anesthetic was induced.  Preoperative antibiotics were administered.  Time-out was called.  Right leg prescrubbed with alcohol and Betadine and allowed to air dry, prepped with DuraPrep solution and draped in a sterile manner.  Charlie Pitter was used to cover the operative field.  Ankle Esmarch was utilized for approximately 40 minutes. Lateral incision was made over the lateral malleolus.  Skin and subcutaneous tissue were sharply divided.  The care was taken to avoid injury to the superficial peroneal nerve which did require anterior retraction.  The fracture was relatively high metadiaphyseal fibular shaft fracture, but the syndesmosis was stable.  This was tested after plate fixation was performed.  Smith and Nephew plate was selected and applied.  The incision was made.  Skin and subcutaneous tissues were sharply divided.  Fracture site was identified.  Relatively high fibular fracture was encountered, but that was secured with a lag screw placed anterior proximal to distal posterior.  The plate was then applied with locking screws distally placed 2nd and nonlocking screws  placed proximally.  This gave good reduction of the fracture.  Syndesmosis stable.  Incision was thoroughly irrigated with a couple of liters of irrigating solution.  Attention directed medially.  Incision made over the medial malleolar fracture which was a very small fracture.  Bone quality was poor.  Saphenous vein and nerve were mobilized posteriorly. The reduction was then performed after visualizing the fracture edges. Fragment was very small, but it was reduced and held with 2 K wires.  A 40 mm cannulated screw with washer was placed to secure the fracture. Thorough irrigation was then performed.  Ankle Esmarch was released.  Incisions were closed using 2-0 Vicryl and 3-0 nylon.  A well- padded posterior splint applied.  The patient tolerated the procedure well without immediate complication.  Transferred to recovery room in stable condition.  Benjiman Core' assistance was required all times during the case for retraction, opening, closing and screw placement.     Anderson Malta, M.D.     GSD/MEDQ  D:  08/16/2015  T:  08/16/2015  Job:  [id number]

## 2015-08-16 NOTE — ED Provider Notes (Signed)
CSN: [id number]     Arrival date & time 08/16/15  0031 History  By signing my name below, I, Hansel Feinstein, attest that this documentation has been prepared under the direction and in the presence of Varney Biles, MD. Electronically Signed: Hansel Feinstein, ED Scribe. 08/16/2015. 1:22 AM.    Chief Complaint  Patient presents with  . Ankle Injury   The history is provided by the patient. No language interpreter was used.   HPI Comments: [name] is a 75 y.o. female brought in by ambulance who presents to the Emergency Department complaining of moderate lateral right ankle pain and swelling s/p mechanical fall that occurred at 10:30PM. Pt states she was sitting on the edge of the couch, fell asleep, slipped off the couch and twisted her right ankle as she fell to the floor, landing on her buttock. No LOC or head injury. Pt has not been able to bear weight on the ankle since injury. She states her pain is worsened with movement. Pt takes daily ASA, but no other anticoagulants. She denies additional injuries.   Past Medical History  Diagnosis Date  . Osteoarthritis   . Hiatal hernia   . Chronic maxillary sinusitis     neti pot  . Depression     alone a lot  . Eustachian tube dysfunction   . Tardive dyskinesia     possibly reglan, vitamin E helps  . Mitral valve prolapse     antibiotics before dental procedures  . GERD (gastroesophageal reflux disease)   . Allergic rhinitis   . Glaucoma   . Hypothyroid   . Hypertension   . Heart murmur     hx. "MVP" -predental antibiotics  . Memory loss     short term memory loss  . Urgency of urination     some UTI in past  . Stroke [facility])     mini storkes left leg paraylsis. patient denies weakness 01/08/14.   . Cancer ([place]) 07/10/13    Pancreatic cancer with MRI scan 06-19-13   Past Surgical History  Procedure Laterality Date  . Knee surgery Left     x 5, total knee Left knee  . 1 baker cyst removed    . Dilation and curettage of uterus       x3  . Lumbar spine surgery      x2  . Abdominal hysterectomy      including ovaries  . Lumbar spine surgery      cyst  . Joint replacement      LTKA  . Blepharoplasty Bilateral     with cataract surgery  . Eye surgery Right     cataract  . Eus N/A 07/10/2013    Procedure: ESOPHAGEAL ENDOSCOPIC ULTRASOUND (EUS) RADIAL;  Surgeon: Arta Silence, MD;  Location: [facility] ENDOSCOPY;  Service: Endoscopy;  Laterality: N/A;  . Fine needle aspiration N/A 07/10/2013    Procedure: FINE NEEDLE ASPIRATION (FNA) LINEAR;  Surgeon: Arta Silence, MD;  Location: [facility] ENDOSCOPY;  Service: Endoscopy;  Laterality: N/A;  possible fna  . Back surgery      fusion  . Breast surgery      Biopsy left 2 times  . Colonoscopy w/ polypectomy      2004 last colonoscopy, no polyps  . Laparoscopy N/A 08/07/2013    Procedure: LAPAROSCOPY DIAGNOSTIC;  Surgeon: [name], MD;  Location: [facility];  Service: General;  Laterality: N/A;  . Whipple procedure N/A 08/07/2013    Procedure: WHIPPLE PROCEDURE;  Surgeon: Dorris Fetch  [name], MD;  Location: [facility];  Service: General;  Laterality: N/A;  . Esophagogastroduodenoscopy N/A 09/11/2013    Procedure: ESOPHAGOGASTRODUODENOSCOPY (EGD);  Surgeon: Cleotis Nipper, MD;  Location: [facility]COPY;  Service: Endoscopy;  Laterality: N/A;  Moderate sedation okay if MAC not available   Family History  Problem Relation Age of Onset  . Cancer Mother     Breast Cancer with Metastatic disease  . Alzheimer's disease Father   . Other Brother 27    GSW   Social History  Substance Use Topics  . Smoking status: Never Smoker   . Smokeless tobacco: Never Used  . Alcohol Use: No   OB History    No data available     Review of Systems  A complete 10 system review of systems was obtained and all systems are negative except as noted in the HPI and PMH.    Allergies  Other; Metoclopramide hcl; Niacin; Trovan; Nortriptyline; Benzocaine-resorcinol; Celecoxib; Glucosamine; Phenazopyridine hcl; Sulfa  antibiotics; and Sulfonamide derivatives  Home Medications   Prior to Admission medications   Medication Sig Start Date End Date Taking? Authorizing Provider  acetaminophen (TYLENOL) 325 MG tablet Take 650 mg by mouth every 6 (six) hours as needed for mild pain, moderate pain or fever. Reported on 07/31/2015   Yes Historical Provider, MD  acetaminophen-codeine (TYLENOL #3) 300-30 MG tablet Take 1 tablet by mouth every 4 (four) hours as needed for moderate pain or severe pain. Reported on 07/31/2015   Yes Historical Provider, MD  aluminum & magnesium hydroxide-simethicone (MYLANTA) 500-450-40 MG/5ML suspension Take 5 mLs by mouth every 6 (six) hours as needed for indigestion. Reported on 07/31/2015   Yes Historical Provider, MD  aspirin 81 MG tablet Take 81 mg by mouth at bedtime.    Yes Historical Provider, MD  atenolol (TENORMIN) 25 MG tablet Take 1 tablet by mouth  every morning 02/09/15  Yes Marin Olp, MD  baclofen (LIORESAL) 10 MG tablet Take 1 tablet (10 mg total) by mouth 3 (three) times daily. 06/12/15  Yes Marcial Pacas, MD  carboxymethylcellulose (REFRESH TEARS) 0.5 % SOLN Place 1 drop into both eyes 2 (two) times daily.    Yes Historical Provider, MD  cholecalciferol (VITAMIN D) 1000 UNITS tablet Take 1,000 Units by mouth 2 (two) times daily.    Yes Historical Provider, MD  cyanocobalamin 2000 MCG tablet Take 2,000 mcg by mouth daily at 12 noon.    Yes Historical Provider, MD  docusate sodium (COLACE) 100 MG capsule Take 200 mg by mouth at bedtime.   Yes Historical Provider, MD  feeding supplement (BOOST HIGH PROTEIN) LIQD Take 1 Container by mouth daily.   Yes Historical Provider, MD  FLUoxetine (PROZAC) 20 MG capsule Take 1 capsule (20 mg total) by mouth daily. 11/17/14  Yes Marin Olp, MD  furosemide (LASIX) 20 MG tablet Take 1 tablet (20 mg total) by mouth daily. 08/06/15  Yes Isaiah Serge, NP  gabapentin (NEURONTIN) 400 MG capsule 2 caps 3 times daily and 3 at hs Patient taking  differently: 800 mg 3 (three) times daily. 2 caps 3 times daily and 3 at hs 05/11/15  Yes Dennie Bible, NP  hydrocortisone cream 1 % Apply 1 application topically 2 (two) times daily. For rectal cyst   Yes Historical Provider, MD  levothyroxine (SYNTHROID, LEVOTHROID) 175 MCG tablet Take 1 tablet by mouth  daily before breakfast 02/09/15  Yes Marin Olp, MD  lipase/protease/amylase (CREON) 12000 UNITS CPEP capsule Take 2  capsules (24,000 Units total) by mouth 3 (three) times daily before meals. 05/25/14  Yes Michael Boston, MD  loratadine (CLARITIN) 10 MG tablet Take 10 mg by mouth daily.   Yes Historical Provider, MD  lovastatin (MEVACOR) 40 MG tablet Take 1 tablet by mouth at  bedtime 02/09/15  Yes Marin Olp, MD  meclizine (ANTIVERT) 25 MG tablet TAKE 1 TABLET BY MOUTH EVERY 6 HOURS AS NEEDED FOR NAUSEA OR DIZZINESS 07/31/15  Yes Marin Olp, MD  Multiple Vitamin (MULTIVITAMIN WITH MINERALS) TABS tablet Take 1 tablet by mouth daily.   Yes Historical Provider, MD  omeprazole (PRILOSEC OTC) 20 MG tablet Take 20 mg by mouth 2 (two) times daily.    Yes Historical Provider, MD  OXcarbazepine (TRILEPTAL) 150 MG tablet TAKE 1/2 TABLET TWICE A DAY FOR 1 WEEK THEN 1 TABLET TWICE A DAY Patient taking differently: Take 150 mg by mouth 2 (two) times daily.  05/11/15  Yes Dennie Bible, NP  polycarbophil (FIBERCON) 625 MG tablet Take 625 mg by mouth daily.    Yes Historical Provider, MD  potassium chloride (K-DUR) 10 MEQ tablet Take 1 tablet (10 mEq total) by mouth daily. 08/06/15  Yes Isaiah Serge, NP  promethazine (PHENERGAN) 25 MG tablet Take 25 mg by mouth every 6 (six) hours as needed for nausea or vomiting.   Yes Historical Provider, MD  rOPINIRole (REQUIP) 0.5 MG tablet Take 1 tablet (0.5 mg total) by mouth at bedtime. 05/11/15  Yes Dennie Bible, NP  Simethicone 180 MG CAPS Take 180 mg by mouth 4 (four) times daily.    Yes Historical Provider, MD  vitamin E 400 UNIT  capsule Take 800 Units by mouth 2 (two) times daily. For tartive dyskinesia   Yes Historical Provider, MD   BP 182/84 mmHg  Pulse 88  Temp(Src) 98.3 F (36.8 C) (Oral)  Resp 18  Ht 5\' 1"  (1.549 m)  Wt 150 lb (68.04 kg)  BMI 28.36 kg/m2  SpO2 93% Physical Exam  Constitutional: She is oriented to person, place, and time. She appears well-developed and well-nourished.  HENT:  Head: Normocephalic and atraumatic.  Eyes: Conjunctivae and EOM are normal. Pupils are equal, round, and reactive to light.  Neck: Normal range of motion. Neck supple.  Cardiovascular: Normal rate.   Pulmonary/Chest: Effort normal. No respiratory distress.  Abdominal: She exhibits no distension.  Musculoskeletal: Normal range of motion. She exhibits tenderness.  Right ankle exam reveals effusion around the lateral malleolar region with TTP. No bruising appreciated. 2+ dorsalis pedis.   Neurological: She is alert and oriented to person, place, and time.  Gross sensory exam appears to be normal, however limited d/t existing neuropathy.   Skin: Skin is warm and dry.  Psychiatric: She has a normal mood and affect. Her behavior is normal.  Nursing note and vitals reviewed.   ED Course  Procedures (including critical care time) DIAGNOSTIC STUDIES: Oxygen Saturation is 91% on RA, low by my interpretation.    COORDINATION OF CARE: 1:21 AM Discussed treatment plan with pt at bedside which includes XR and pt agreed to plan.  2:41 AM Pt and family updated with XR results. Pt reports relief of pain after IM 2 mg dilaudid   Imaging Review Dg Ankle Complete Right  08/16/2015  CLINICAL DATA:  Status post fall off couch while sleeping, with right ankle pain and swelling. Initial encounter. EXAM: RIGHT ANKLE - COMPLETE 3+ VIEW COMPARISON:  Right foot radiographs performed 05/09/2014 FINDINGS: There are  mildly displaced medial and lateral malleolar fractures, and question of a tiny avulsion fracture at the posterior  malleolus. There is mild medial widening of the ankle mortise, with mild lateral tilt. Diffuse soft tissue swelling is noted about the ankle. The joint spaces are preserved. No significant soft tissue abnormalities are seen. IMPRESSION: 1. Mildly displaced medial and lateral malleolar fractures, and question of a tiny avulsion fracture at the posterior malleolus. 2. Mild medial widening of the ankle mortise, with mild lateral tilt. Electronically Signed   By: Garald Balding M.D.   On: 08/16/2015 02:19   Nm Bone Scan 3 Phase  08/14/2015  CLINICAL DATA:  Trauma to left knee. History of left knee arthroplasty. EXAM: NUCLEAR MEDICINE 3-PHASE BONE SCAN TECHNIQUE: Radionuclide angiographic images, immediate static blood pool images, and 3-hour delayed static images were obtained of the knees after intravenous injection of radiopharmaceutical. RADIOPHARMACEUTICALS:  26.4 mCi Tc-90m MDP COMPARISON:  None. FINDINGS: Vascular phase: On the flow phase portion of the exam there is abnormal asymmetric increased uptake surrounding the left knee. Blood pool phase: On the blood pool phase portion of the examination there is abnormal asymmetric increased uptake surrounding the femoral component of the left knee arthroplasty device. Delayed phase: On the delayed phase images there is asymmetric increased uptake surrounding both the tibial and femoral components of the left knee arthroplasty device. IMPRESSION: Increased uptake on all 3 phases compatible with left knee arthroplasty device loosening or infection. Electronically Signed   By: Kerby Moors M.D.   On: 08/14/2015 16:54   I have personally reviewed and evaluated these images as part of my medical decision-making.   MDM   Final diagnoses:  Left lateral ankle pain  Bimalleolar ankle fracture, right, closed, initial encounter    I personally performed the services described in this documentation, which was scribed in my presence. The recorded information has  been reviewed and is accurate.   Pt comes in post fall. She had a fall - and the xrays confirmed suspicion of ankle fracture.  Pt lives at home and is independent. Son reports that he can be of some assistance, but doesn't think it would be adequate.  Pt sees [facility] and i spoke with Dr. Marlou Sa - who will have his team see the patient in the morning. Pt is going to be npo.  As an aside - i accidentally ordered antibiotics for the patient that she didn't need. By the time i realized my mistake, pt had already been transferred to her medicine floor. The hospitalist is aware of the error from my part.   Varney Biles, MD 08/16/15 [phone]

## 2015-08-16 NOTE — Anesthesia Preprocedure Evaluation (Addendum)
Anesthesia Evaluation  Patient identified by MRN, date of birth, ID band Patient awake    Reviewed: Allergy & Precautions, H&P , NPO status , Patient's Chart, lab work & pertinent test results, reviewed documented beta blocker date and time   Airway Mallampati: III  TM Distance: >3 FB Neck ROM: Full    Dental no notable dental hx. (+) Upper Dentures, Partial Lower, Dental Advisory Given   Pulmonary neg pulmonary ROS,    Pulmonary exam normal breath sounds clear to auscultation       Cardiovascular hypertension, Pt. on medications and Pt. on home beta blockers + Valvular Problems/Murmurs MVP  Rhythm:Regular Rate:Normal     Neuro/Psych Anxiety Depression CVA    GI/Hepatic Neg liver ROS, GERD  Medicated and Controlled,  Endo/Other  negative endocrine ROSHypothyroidism   Renal/GU negative Renal ROS  negative genitourinary   Musculoskeletal  (+) Arthritis , Osteoarthritis,    Abdominal   Peds  Hematology negative hematology ROS (+)   Anesthesia Other Findings   Reproductive/Obstetrics negative OB ROS                           Anesthesia Physical Anesthesia Plan  ASA: III  Anesthesia Plan: General and Regional   Post-op Pain Management: GA combined w/ Regional for post-op pain   Induction: Intravenous  Airway Management Planned: Oral ETT  Additional Equipment:   Intra-op Plan:   Post-operative Plan: Extubation in OR  Informed Consent: I have reviewed the patients History and Physical, chart, labs and discussed the procedure including the risks, benefits and alternatives for the proposed anesthesia with the patient or authorized representative who has indicated his/her understanding and acceptance.   Dental advisory given  Plan Discussed with: CRNA, Anesthesiologist and Surgeon  Anesthesia Plan Comments:        Anesthesia Quick Evaluation

## 2015-08-16 NOTE — ED Notes (Signed)
EDP at bedside  

## 2015-08-16 NOTE — Progress Notes (Signed)
Patient seen and evaluated earlier this a.m. by my associate. Please refer to H&P for details regarding assessment and plan. Of note orthopedic surgery on board  We'll plan on reassessing next a.m.  Gen.: Patient in no acute distress Cardiovascular: No cyanosis Pulmonary: No increased work of breathing, no wheezes  [place], Linward Foster

## 2015-08-16 NOTE — ED Notes (Signed)
Family at the bedside.

## 2015-08-16 NOTE — Transfer of Care (Signed)
Immediate Anesthesia Transfer of Care Note  Patient: [name]  Procedure(s) Performed: Procedure(s): OPEN REDUCTION INTERNAL FIXATION (ORIF) ANKLE FRACTURE (Right)  Patient Location: PACU  Anesthesia Type:GA combined with regional for post-op pain  Level of Consciousness: sedated  Airway & Oxygen Therapy: Patient Spontanous Breathing and Patient connected to face mask oxygen  Post-op Assessment: Report given to RN and Post -op Vital signs reviewed and stable  Post vital signs: Reviewed and stable  Last Vitals:  Filed Vitals:   08/16/15 0518 08/16/15 0623  BP: 182/84 162/67  Pulse: 88 91  Temp: 36.8 C 37.4 C  Resp: 18 18    Complications: No apparent anesthesia complications

## 2015-08-16 NOTE — Brief Op Note (Signed)
08/16/2015  5:21 PM  PATIENT:  [name]  75 y.o. female  PRE-OPERATIVE DIAGNOSIS:  Displaced Right Ankle Fracture  POST-OPERATIVE DIAGNOSIS:  Displaced Right Ankle Fracture  PROCEDURE:  Procedure(s): OPEN REDUCTION INTERNAL FIXATION (ORIF) ANKLE FRACTURE  SURGEON:  Surgeon(s): Meredith Pel, MD  ASSISTANT: Benjiman Core PA  ANESTHESIA:   regional and general  EBL: 35 ml    Total I/O In: 1500 [I.V.:1500] Out: 1450 [Urine:1300; Blood:150]  BLOOD ADMINISTERED: none  DRAINS: none   LOCAL MEDICATIONS USED:  none  SPECIMEN:  No Specimen  COUNTS:  YES  TOURNIQUET:    DICTATION: .Other Dictation: Dictation Number [id number]  PLAN OF CARE: Admit to inpatient   PATIENT DISPOSITION:  PACU - hemodynamically stable

## 2015-08-16 NOTE — ED Notes (Signed)
Denies thinners

## 2015-08-16 NOTE — H&P (Addendum)
[facility] History and Physical  [name] [id number] DOB: 10-14-1940 DOA: 08/16/2015  Referring physician: ED PCP: [name], MD   Chief Complaint: Right ankle pain  HPI:  [name] is a 75 year old female with a past medical history significant for OA, HTN, hypothyroidism, MVP, pancreatic cancer s/p Whipple, tardive dyskinesia, s/p total left knee replacement, and depression; who presents after falling off her couch this morning twisting her right ankle. The fall occurred at approximally 10:30 PM she had fallen asleep and slipped off the couch and somehow twisted her right ankle falling on her buttocks. Patient denies any loss consciousness or head trauma. She complained of severe right ankle pain and swelling. Patient was unable to bear weight on the affected extremity. Upon arrival patient is evaluated and x-ray imaging shows a medial and lateral malleolar fracture on the right. The ED physician, Dr. Kathrynn Humble, consulted Dr. Marlou Sa  who was on call for orthopedics.  He recommended the patient to remain NPO for possible need of surgical procedure later on today. Furthermore, patient notes that she recently had a nuclear medicine bone scan of her left knee due to continued swelling and pain ordered by Dr. Lorin Mercy who is with [facility] orthopedics. She notes that she was supposed to follow-up with Dr. Lorin Mercy to get the results of the scan. Review of the bone scan report of the left knee showed increased uptake on all 3 phases compatible with arthroplasty device loosening or infection.  Review of Systems  Constitutional: Negative for fever and chills.  HENT: Negative for hearing loss and tinnitus.   Eyes: Negative for photophobia and pain.  Respiratory: Negative for hemoptysis, sputum production and shortness of breath.   Cardiovascular: Positive for leg swelling. Negative for chest pain.  Gastrointestinal: Negative for vomiting and abdominal pain.  Genitourinary: Negative for urgency  and frequency.  Musculoskeletal: Positive for joint pain and falls.  Skin: Negative for itching and rash.  Neurological: Negative for speech change and focal weakness.  Endo/Heme/Allergies: Negative for polydipsia. Does not bruise/bleed easily.     Past Medical History  Diagnosis Date  . Osteoarthritis   . Hiatal hernia   . Chronic maxillary sinusitis     neti pot  . Depression     alone a lot  . Eustachian tube dysfunction   . Tardive dyskinesia     possibly reglan, vitamin E helps  . Mitral valve prolapse     antibiotics before dental procedures  . GERD (gastroesophageal reflux disease)   . Allergic rhinitis   . Glaucoma   . Hypothyroid   . Hypertension   . Heart murmur     hx. "MVP" -predental antibiotics  . Memory loss     short term memory loss  . Urgency of urination     some UTI in past  . Stroke [facility])     mini storkes left leg paraylsis. patient denies weakness 01/08/14.   . Cancer ([place]) 07/10/13    Pancreatic cancer with MRI scan 06-19-13     Past Surgical History  Procedure Laterality Date  . Knee surgery Left     x 5, total knee Left knee  . 1 baker cyst removed    . Dilation and curettage of uterus      x3  . Lumbar spine surgery      x2  . Abdominal hysterectomy      including ovaries  . Lumbar spine surgery      cyst  . Joint replacement  LTKA  . Blepharoplasty Bilateral     with cataract surgery  . Eye surgery Right     cataract  . Eus N/A 07/10/2013    Procedure: ESOPHAGEAL ENDOSCOPIC ULTRASOUND (EUS) RADIAL;  Surgeon: Arta Silence, MD;  Location: [facility] ENDOSCOPY;  Service: Endoscopy;  Laterality: N/A;  . Fine needle aspiration N/A 07/10/2013    Procedure: FINE NEEDLE ASPIRATION (FNA) LINEAR;  Surgeon: Arta Silence, MD;  Location: [facility] ENDOSCOPY;  Service: Endoscopy;  Laterality: N/A;  possible fna  . Back surgery      fusion  . Breast surgery      Biopsy left 2 times  . Colonoscopy w/ polypectomy      2004 last colonoscopy, no polyps   . Laparoscopy N/A 08/07/2013    Procedure: LAPAROSCOPY DIAGNOSTIC;  Surgeon: [name], MD;  Location: [place];  Service: General;  Laterality: N/A;  . Whipple procedure N/A 08/07/2013    Procedure: WHIPPLE PROCEDURE;  Surgeon: [name], MD;  Location: [place];  Service: General;  Laterality: N/A;  . Esophagogastroduodenoscopy N/A 09/11/2013    Procedure: ESOPHAGOGASTRODUODENOSCOPY (EGD);  Surgeon: Cleotis Nipper, MD;  Location: [facility] ENDOSCOPY;  Service: Endoscopy;  Laterality: N/A;  Moderate sedation okay if MAC not available      Social History:  reports that she has never smoked. She has never used smokeless tobacco. She reports that she does not drink alcohol or use illicit drugs. Where does patient live--home  and with whom if at home?Alone Can patient participate in ADLs? Yes  Allergies  Allergen Reactions  . Other Other (See Comments)    According to patient Trilafor and Reglan cause Tardive Dyskinesia  . Metoclopramide Hcl Other (See Comments)    Shaking; caused tardive dyskinesia  . Niacin Other (See Comments)    shaking  . Trovan [Alatrofloxacin Mesylate] Other (See Comments)    Caused shaking and nervousness  . Nortriptyline Other (See Comments)    Dizziness   . Benzocaine-Resorcinol Rash  . Celecoxib Other (See Comments)    unknown  . Glucosamine Other (See Comments)    unknown  . Phenazopyridine Hcl Other (See Comments)    unknown  . Sulfa Antibiotics Nausea And Vomiting and Rash  . Sulfonamide Derivatives Nausea And Vomiting and Rash    Family History  Problem Relation Age of Onset  . Cancer Mother     Breast Cancer with Metastatic disease  . Alzheimer's disease Father   . Other Brother 33    GSW       Prior to Admission medications   Medication Sig Start Date End Date Taking? Authorizing Provider  acetaminophen (TYLENOL) 325 MG tablet Take 650 mg by mouth every 6 (six) hours as needed for mild pain, moderate pain or fever. Reported on 07/31/2015   Yes  Historical Provider, MD  acetaminophen-codeine (TYLENOL #3) 300-30 MG tablet Take 1 tablet by mouth every 4 (four) hours as needed for moderate pain or severe pain. Reported on 07/31/2015   Yes Historical Provider, MD  aluminum & magnesium hydroxide-simethicone (MYLANTA) 500-450-40 MG/5ML suspension Take 5 mLs by mouth every 6 (six) hours as needed for indigestion. Reported on 07/31/2015   Yes Historical Provider, MD  aspirin 81 MG tablet Take 81 mg by mouth at bedtime.    Yes Historical Provider, MD  atenolol (TENORMIN) 25 MG tablet Take 1 tablet by mouth  every morning 02/09/15  Yes Marin Olp, MD  baclofen (LIORESAL) 10 MG tablet Take 1 tablet (10 mg total) by mouth 3 (  three) times daily. 06/12/15  Yes Marcial Pacas, MD  carboxymethylcellulose (REFRESH TEARS) 0.5 % SOLN Place 1 drop into both eyes 2 (two) times daily.    Yes Historical Provider, MD  cholecalciferol (VITAMIN D) 1000 UNITS tablet Take 1,000 Units by mouth 2 (two) times daily.    Yes Historical Provider, MD  cyanocobalamin 2000 MCG tablet Take 2,000 mcg by mouth daily at 12 noon.    Yes Historical Provider, MD  docusate sodium (COLACE) 100 MG capsule Take 200 mg by mouth at bedtime.   Yes Historical Provider, MD  feeding supplement (BOOST HIGH PROTEIN) LIQD Take 1 Container by mouth daily.   Yes Historical Provider, MD  FLUoxetine (PROZAC) 20 MG capsule Take 1 capsule (20 mg total) by mouth daily. 11/17/14  Yes Marin Olp, MD  furosemide (LASIX) 20 MG tablet Take 1 tablet (20 mg total) by mouth daily. 08/06/15  Yes Isaiah Serge, NP  gabapentin (NEURONTIN) 400 MG capsule 2 caps 3 times daily and 3 at hs Patient taking differently: 800 mg 3 (three) times daily. 2 caps 3 times daily and 3 at hs 05/11/15  Yes Dennie Bible, NP  hydrocortisone cream 1 % Apply 1 application topically 2 (two) times daily. For rectal cyst   Yes Historical Provider, MD  levothyroxine (SYNTHROID, LEVOTHROID) 175 MCG tablet Take 1 tablet by mouth  daily  before breakfast 02/09/15  Yes Marin Olp, MD  lipase/protease/amylase (CREON) 12000 UNITS CPEP capsule Take 2 capsules (24,000 Units total) by mouth 3 (three) times daily before meals. 05/25/14  Yes Michael Boston, MD  loratadine (CLARITIN) 10 MG tablet Take 10 mg by mouth daily.   Yes Historical Provider, MD  lovastatin (MEVACOR) 40 MG tablet Take 1 tablet by mouth at  bedtime 02/09/15  Yes Marin Olp, MD  meclizine (ANTIVERT) 25 MG tablet TAKE 1 TABLET BY MOUTH EVERY 6 HOURS AS NEEDED FOR NAUSEA OR DIZZINESS 07/31/15  Yes Marin Olp, MD  Multiple Vitamin (MULTIVITAMIN WITH MINERALS) TABS tablet Take 1 tablet by mouth daily.   Yes Historical Provider, MD  omeprazole (PRILOSEC OTC) 20 MG tablet Take 20 mg by mouth 2 (two) times daily.    Yes Historical Provider, MD  OXcarbazepine (TRILEPTAL) 150 MG tablet TAKE 1/2 TABLET TWICE A DAY FOR 1 WEEK THEN 1 TABLET TWICE A DAY Patient taking differently: Take 150 mg by mouth 2 (two) times daily.  05/11/15  Yes Dennie Bible, NP  polycarbophil (FIBERCON) 625 MG tablet Take 625 mg by mouth daily.    Yes Historical Provider, MD  potassium chloride (K-DUR) 10 MEQ tablet Take 1 tablet (10 mEq total) by mouth daily. 08/06/15  Yes Isaiah Serge, NP  promethazine (PHENERGAN) 25 MG tablet Take 25 mg by mouth every 6 (six) hours as needed for nausea or vomiting.   Yes Historical Provider, MD  rOPINIRole (REQUIP) 0.5 MG tablet Take 1 tablet (0.5 mg total) by mouth at bedtime. 05/11/15  Yes Dennie Bible, NP  Simethicone 180 MG CAPS Take 180 mg by mouth 4 (four) times daily.    Yes Historical Provider, MD  vitamin E 400 UNIT capsule Take 800 Units by mouth 2 (two) times daily. For tartive dyskinesia   Yes Historical Provider, MD     Physical Exam: Filed Vitals:   08/16/15 0037 08/16/15 0100 08/16/15 0130  BP: 177/89 165/88 162/79  Pulse: 91 85 86  Temp: 99.6 F (37.6 C)    TempSrc: Oral  Resp: 18 16   Height: 5\' 1"  (1.549 m)     Weight: 68.04 kg (150 lb)    SpO2: 91% 93% 96%     Constitutional: Vital signs reviewed. Patient is a well-developed and well-nourished in no acute distress and cooperative with exam. Alert and oriented x3.  Head: Normocephalic and atraumatic  Ear: TM normal bilaterally  Mouth: no erythema or exudates, MMM  Eyes: PERRL, EOMI, conjunctivae normal, No scleral icterus.  Neck: Supple, Trachea midline normal ROM, No JVD, mass, thyromegaly, or carotid bruit present.  Cardiovascular: RRR, S1 normal, S2 normal, no MRG, pulses symmetric and intact bilaterally  Pulmonary/Chest: CTAB, no wheezes, rales, or rhonchi  Abdominal: Soft. Non-tender, non-distended, bowel sounds are normal, no masses, organomegaly, or guarding present.  GU: no CVA tenderness Musculoskeletal: No joint deformities, erythema, or stiffness, ROM full and no nontender Ext: no cyanosis, pulses palpable bilaterally (DP and PT) Right ankle splinted in cast. Left knee with moderate swelling no increased warmth or erythema noted. Hematology: no cervical, inginal, or axillary adenopathy.  Neurological: A&O x3, Strenght is normal and symmetric bilaterally, cranial nerve II-XII are grossly intact, no focal motor deficit, sensory intact to light touch bilaterally.  Skin: Warm, dry and intact. No rash, cyanosis, or clubbing.  Psychiatric: Normal mood and affect. speech and behavior is normal. Judgment and thought content normal. Cognition and memory are normal.      Data Review   Micro Results No results found for this or any previous visit (from the past 240 hour(s)).  Radiology Reports Dg Chest 2 View  07/28/2015  CLINICAL DATA:  Central chest pain since this morning radiating to left polyp. Dizziness. EXAM: CHEST  2 VIEW COMPARISON:  09/02/2013 FINDINGS: The heart size and mediastinal contours are within normal limits. Both lungs are clear. Elevation of right hemidiaphragm again noted. Several mild mid thoracic spine vertebral body  compression fracture deformities are noted. IMPRESSION: Stable elevation of right hemidiaphragm. No active cardiopulmonary disease. Electronically Signed   By: Earle Gell M.D.   On: 07/28/2015 17:22   Dg Ankle Complete Right  08/16/2015  CLINICAL DATA:  Status post fall off couch while sleeping, with right ankle pain and swelling. Initial encounter. EXAM: RIGHT ANKLE - COMPLETE 3+ VIEW COMPARISON:  Right foot radiographs performed 05/09/2014 FINDINGS: There are mildly displaced medial and lateral malleolar fractures, and question of a tiny avulsion fracture at the posterior malleolus. There is mild medial widening of the ankle mortise, with mild lateral tilt. Diffuse soft tissue swelling is noted about the ankle. The joint spaces are preserved. No significant soft tissue abnormalities are seen. IMPRESSION: 1. Mildly displaced medial and lateral malleolar fractures, and question of a tiny avulsion fracture at the posterior malleolus. 2. Mild medial widening of the ankle mortise, with mild lateral tilt. Electronically Signed   By: Garald Balding M.D.   On: 08/16/2015 02:19   Nm Bone Scan 3 Phase  08/14/2015  CLINICAL DATA:  Trauma to left knee. History of left knee arthroplasty. EXAM: NUCLEAR MEDICINE 3-PHASE BONE SCAN TECHNIQUE: Radionuclide angiographic images, immediate static blood pool images, and 3-hour delayed static images were obtained of the knees after intravenous injection of radiopharmaceutical. RADIOPHARMACEUTICALS:  26.4 mCi Tc-34m MDP COMPARISON:  None. FINDINGS: Vascular phase: On the flow phase portion of the exam there is abnormal asymmetric increased uptake surrounding the left knee. Blood pool phase: On the blood pool phase portion of the examination there is abnormal asymmetric increased uptake surrounding the femoral component of the  left knee arthroplasty device. Delayed phase: On the delayed phase images there is asymmetric increased uptake surrounding both the tibial and femoral  components of the left knee arthroplasty device. IMPRESSION: Increased uptake on all 3 phases compatible with left knee arthroplasty device loosening or infection. Electronically Signed   By: Kerby Moors M.D.   On: 08/14/2015 16:54     CBC  Recent Labs Lab 08/16/15 0310  WBC 8.4  HGB 12.0  HCT 36.9  PLT 179  MCV 97.6  MCH 31.7  MCHC 32.5  RDW 14.7  LYMPHSABS 1.9  MONOABS 0.7  EOSABS 0.4  BASOSABS 0.0    Chemistries  No results for input(s): NA, K, CL, CO2, GLUCOSE, BUN, CREATININE, CALCIUM, MG, AST, ALT, ALKPHOS, BILITOT in the last 168 hours.  Invalid input(s): GFRCGP ------------------------------------------------------------------------------------------------------------------ estimated creatinine clearance is 53 mL/min (by C-G formula based on Cr of 0.81). ------------------------------------------------------------------------------------------------------------------ No results for input(s): HGBA1C in the last 72 hours. ------------------------------------------------------------------------------------------------------------------ No results for input(s): CHOL, HDL, LDLCALC, TRIG, CHOLHDL, LDLDIRECT in the last 72 hours. ------------------------------------------------------------------------------------------------------------------ No results for input(s): TSH, T4TOTAL, T3FREE, THYROIDAB in the last 72 hours.  Invalid input(s): FREET3 ------------------------------------------------------------------------------------------------------------------ No results for input(s): VITAMINB12, FOLATE, FERRITIN, TIBC, IRON, RETICCTPCT in the last 72 hours.  Coagulation profile No results for input(s): INR, PROTIME in the last 168 hours.  No results for input(s): DDIMER in the last 72 hours.  Cardiac Enzymes No results for input(s): CKMB, TROPONINI, MYOGLOBIN in the last 168 hours.  Invalid input(s):  CK ------------------------------------------------------------------------------------------------------------------ Invalid input(s): POCBNP   CBG: No results for input(s): GLUCAP in the last 168 hours.        Assessment/Plan Right Malleolus fracture secondary to fall: Acute. Patient had fallen off the couch this morning and twisted her right ankle which resulted in acute medial and lateral malleolar fracture on x-ray. Splinted in the ED. Dr. Marlou Sa consulted orthopedics who also works with Dr. Lorin Mercy with whom the patient already sees. - Admit to a MedSurg bed - NPO - Gentle IV fluids of 75 mL/ hour - Hydrocodone/ morphine prn moderate to severe pain respectively   - Follow-up orthopedic recommendations - Social work consult as patient will likely need rehabilitation   Status post total left knee replacement with Abnormal nuclear medicine bone scan of left knee: There is question of loosening of the arthroplasty device versus infection - will defer to orthopedics  Lactic acidosis: Acute. A lactic acid level was randomly checked  and came back elevated at 2.14. Question of possible sources include left knee for which the recent nuclear medicine scan came back abnormal. On physical exam the knee shows mild to moderate swelling no signs of increased warmth or erythema - Trend lactic acid levels  Essential hypertension - Continue atenolol, furosemide,  Hypothyroidism  -Continue levothyroxine  Hyperlipidemia - Continue Atorvastatin  Depression - Continue Prozac   Tardive dyskinesia - Continue baclofen, Trileptal   Gerd - Protonix   Heparin for DVT prophylaxis awaiting BMP  Code Status:   full Family Communication: bedside Disposition Plan: admit   Total time spent 55 minutes.Greater than 50% of this time was spent in counseling, explanation of diagnosis, planning of further management, and coordination of care  [facility] Pager  [phone]  If 7PM-7AM, please contact night-coverage [email or web address] Password [facility] 08/16/2015, 4:57 AM

## 2015-08-16 NOTE — Anesthesia Postprocedure Evaluation (Signed)
Anesthesia Post Note  Patient: [name]  Procedure(s) Performed: Procedure(s) (LRB): OPEN REDUCTION INTERNAL FIXATION (ORIF) ANKLE FRACTURE (Right)  Patient location during evaluation: PACU Anesthesia Type: General and Regional Level of consciousness: awake and alert Pain management: pain level controlled Vital Signs Assessment: post-procedure vital signs reviewed and stable Respiratory status: spontaneous breathing, nonlabored ventilation, respiratory function stable and patient connected to nasal cannula oxygen Cardiovascular status: blood pressure returned to baseline and stable Postop Assessment: no signs of nausea or vomiting Anesthetic complications: no    Last Vitals:  Filed Vitals:   08/16/15 1745 08/16/15 1800  BP: 136/88 150/74  Pulse: 93 94  Temp:  36.6 C  Resp: 19 18    Last Pain:  Filed Vitals:   08/16/15 1808  PainSc: 0-No pain                 Feliza Diven,W. EDMOND

## 2015-08-16 NOTE — Progress Notes (Signed)
Patient arrived on the unit from E.D via stretcher. She is sleepy and goes back to sleep almost immediately. Son came with her but left to have some rest.

## 2015-08-17 ENCOUNTER — Ambulatory Visit: Payer: Medicare Other | Admitting: Physical Therapy

## 2015-08-17 ENCOUNTER — Encounter (HOSPITAL_COMMUNITY): Payer: Self-pay | Admitting: Orthopedic Surgery

## 2015-08-17 DIAGNOSIS — E872 Acidosis: Secondary | ICD-10-CM

## 2015-08-17 DIAGNOSIS — S82899A Other fracture of unspecified lower leg, initial encounter for closed fracture: Secondary | ICD-10-CM

## 2015-08-17 DIAGNOSIS — K219 Gastro-esophageal reflux disease without esophagitis: Secondary | ICD-10-CM

## 2015-08-17 LAB — COMPREHENSIVE METABOLIC PANEL
ALBUMIN: 3 g/dL — AB (ref 3.5–5.0)
ALT: 17 U/L (ref 14–54)
ANION GAP: 7 (ref 5–15)
AST: 20 U/L (ref 15–41)
Alkaline Phosphatase: 102 U/L (ref 38–126)
BUN: 8 mg/dL (ref 6–20)
CHLORIDE: 103 mmol/L (ref 101–111)
CO2: 29 mmol/L (ref 22–32)
CREATININE: 0.64 mg/dL (ref 0.44–1.00)
Calcium: 8.1 mg/dL — ABNORMAL LOW (ref 8.9–10.3)
GFR calc non Af Amer: >60 mL/min (ref >60)
Glucose, Bld: 109 mg/dL — ABNORMAL HIGH (ref 65–99)
Potassium: 3.8 mmol/L (ref 3.5–5.1)
SODIUM: 139 mmol/L (ref 135–145)
Total Bilirubin: 1 mg/dL (ref 0.3–1.2)
Total Protein: 5.5 g/dL — ABNORMAL LOW (ref 6.5–8.1)

## 2015-08-17 LAB — LACTIC ACID, PLASMA: LACTIC ACID, VENOUS: 2.8 mmol/L — AB (ref 0.5–2.0)

## 2015-08-17 NOTE — Progress Notes (Signed)
Subjective: Pt stable - pain ok   Objective: Vital signs in last 24 hours: Temp:  [97.7 F (36.5 C)-99.5 F (37.5 C)] 98.2 F (36.8 C) (03/20 0449) Pulse Rate:  [90-94] 91 (03/20 0449) Resp:  [18-20] 18 (03/20 0449) BP: (136-157)/(62-88) 147/72 mmHg (03/20 0449) SpO2:  [95 %-99 %] 98 % (03/20 0449)  Intake/Output from previous day: 03/19 0701 - 03/20 0700 In: 1500 [I.V.:1500] Out: 1450 [Urine:1300; Blood:150] Intake/Output this shift: Total I/O In: 480 [P.O.:480] Out: -   Exam:  Dorsiflexion/Plantar flexion intact  Labs:  Recent Labs  08/16/15 0310  HGB 12.0    Recent Labs  08/16/15 0310  WBC 8.4  RBC 3.78*  HCT 36.9  PLT 179    Recent Labs  08/16/15 0654 08/17/15 0307  NA 143 139  K 4.0 3.8  CL 103 103  CO2 28 29  BUN 13 8  CREATININE 0.85 0.64  GLUCOSE 103* 109*  CALCIUM 8.6* 8.1*   No results for input(s): LABPT, INR in the last 72 hours.  Assessment/Plan: Pt stable - will likely need snfb   [name] SCOTT 08/17/2015, 11:49 AM

## 2015-08-17 NOTE — Progress Notes (Signed)
[facility] PROGRESS NOTE  [name] [id number] DOB: Oct 11, 1940 DOA: 09-13-2015 PCP: [name], MD  Assessment/Plan: Principal Problem:   Malleolar fracture/Ankle fracture, bimalleolar, closed - Ortho managing  Lactic acidosis - etiology uncertain, VSS - No reported abdominal discomfort or new complaints - No signs of systemic hypoperfusion - Will continue to monitor - obtain urine and blood cultures  Active Problems:   Hypothyroidism - continue synthroid    TARDIVE DYSKINESIA - continue current regimen    Essential hypertension   GERD - stable on protonix    HLD (hyperlipidemia) - stable continue statin   Code Status: full Family Communication: No family at bedside Disposition Plan: Pending improvement in condition    Consultants:  Ortho  Procedures:  Please see ortho notes  Antibiotics:  none  HPI/Subjective: Pt has no new complaints today.  Objective: Filed Vitals:   08/17/15 0449 08/17/15 1324  BP: 147/72 141/55  Pulse: 91 94  Temp: 98.2 F (36.8 C) 100.1 F (37.8 C)  Resp: 18 18    Intake/Output Summary (Last 24 hours) at 08/17/15 1719 Last data filed at 08/17/15 1335  Gross per 24 hour  Intake    720 ml  Output      0 ml  Net    720 ml   Filed Weights   09-13-2015 0037 13-Sep-2015 0623  Weight: 68.04 kg (150 lb) 73.029 kg (161 lb)    Exam:   General:  Pt in nad, alert and awake  Cardiovascular: rrr, no mrg  Respiratory: cta bl, no wheezes, no increased wob  Abdomen: soft, nd, nt  Musculoskeletal: no clubbing   Data Reviewed: Basic Metabolic Panel:  Recent Labs Lab Sep 13, 2015 0654 08/17/15 0307  NA 143 139  K 4.0 3.8  CL 103 103  CO2 28 29  GLUCOSE 103* 109*  BUN 13 8  CREATININE 0.85 0.64  CALCIUM 8.6* 8.1*   Liver Function Tests:  Recent Labs Lab 08/17/15 0307  AST 20  ALT 17  ALKPHOS 102  BILITOT 1.0  PROT 5.5*  ALBUMIN 3.0*   No results for input(s): LIPASE, AMYLASE in the last 168  hours. No results for input(s): AMMONIA in the last 168 hours. CBC:  Recent Labs Lab September 13, 2015 0310  WBC 8.4  NEUTROABS 5.4  HGB 12.0  HCT 36.9  MCV 97.6  PLT 179   Cardiac Enzymes: No results for input(s): CKTOTAL, CKMB, CKMBINDEX, TROPONINI in the last 168 hours. BNP (last 3 results) No results for input(s): BNP in the last 8760 hours.  ProBNP (last 3 results) No results for input(s): PROBNP in the last 8760 hours.  CBG: No results for input(s): GLUCAP in the last 168 hours.  Recent Results (from the past 240 hour(s))  Surgical pcr screen     Status: None   Collection Time: 09/13/15  1:39 PM  Result Value Ref Range Status   MRSA, PCR NEGATIVE NEGATIVE Final   Staphylococcus aureus NEGATIVE NEGATIVE Final    Comment:        The Xpert SA Assay (FDA approved for NASAL specimens in patients over 1 years of age), is one component of a comprehensive surveillance program.  Test performance has been validated by [facility] for patients greater than or equal to 76 year old. It is not intended to diagnose infection nor to guide or monitor treatment.      Studies: Dg Ankle 2 Views Right  Sep 13, 2015  CLINICAL DATA:  Right ankle fracture EXAM: DG C-ARM 61-120 MIN; RIGHT  ANKLE - 2 VIEW COMPARISON:  Film from earlier in the same day FLUOROSCOPY TIME:  Radiation Exposure Index (as provided by the fluoroscopic device): Not available If the device does not provide the exposure index: Fluoroscopy Time:  11 seconds Number of Acquired Images:  2 FINDINGS: Fixation sideplate is noted along the distal fibula with a single fixation screw traversing the medial malleolus. Fracture fragments are in near anatomic alignment. IMPRESSION: ORIF of right ankle fracture. Electronically Signed   By: Inez Catalina M.D.   On: 08/16/2015 18:14   Dg Ankle Complete Right  08/16/2015  CLINICAL DATA:  Status post fall off couch while sleeping, with right ankle pain and swelling. Initial encounter. EXAM:  RIGHT ANKLE - COMPLETE 3+ VIEW COMPARISON:  Right foot radiographs performed 05/09/2014 FINDINGS: There are mildly displaced medial and lateral malleolar fractures, and question of a tiny avulsion fracture at the posterior malleolus. There is mild medial widening of the ankle mortise, with mild lateral tilt. Diffuse soft tissue swelling is noted about the ankle. The joint spaces are preserved. No significant soft tissue abnormalities are seen. IMPRESSION: 1. Mildly displaced medial and lateral malleolar fractures, and question of a tiny avulsion fracture at the posterior malleolus. 2. Mild medial widening of the ankle mortise, with mild lateral tilt. Electronically Signed   By: Garald Balding M.D.   On: 08/16/2015 02:19   Dg Ankle Right Port  08/16/2015  CLINICAL DATA:  Ankle fracture, bimalleolar, closed EXAM: PORTABLE RIGHT ANKLE - 2 VIEW COMPARISON:  08/16/2015 FINDINGS: There is a screw through the medial malleolus. There is a compression plate across the lateral cortex of the distal fibular shaft and lateral malleolus. There is close approximation of medial and lateral fracture fragments. Detail is obscured by cast. IMPRESSION: ORIF bimalleolar ankle fracture Electronically Signed   By: Skipper Cliche M.D.   On: 08/16/2015 18:58   Dg C-arm 1-60 Min  08/16/2015  CLINICAL DATA:  Right ankle fracture EXAM: DG C-ARM 61-120 MIN; RIGHT ANKLE - 2 VIEW COMPARISON:  Film from earlier in the same day FLUOROSCOPY TIME:  Radiation Exposure Index (as provided by the fluoroscopic device): Not available If the device does not provide the exposure index: Fluoroscopy Time:  11 seconds Number of Acquired Images:  2 FINDINGS: Fixation sideplate is noted along the distal fibula with a single fixation screw traversing the medial malleolus. Fracture fragments are in near anatomic alignment. IMPRESSION: ORIF of right ankle fracture. Electronically Signed   By: Inez Catalina M.D.   On: 08/16/2015 18:14    Scheduled Meds: .  atenolol  25 mg Oral Daily  . baclofen  10 mg Oral TID  . docusate sodium  200 mg Oral QHS  . feeding supplement  1 Container Oral Daily  . FLUoxetine  20 mg Oral Daily  . furosemide  20 mg Oral Daily  . gabapentin  800 mg Oral TID  . hydrocortisone cream  1 application Topical BID  . hydroxypropyl methylcellulose / hypromellose  1 drop Both Eyes BID  . levothyroxine  175 mcg Oral QAC breakfast  . lipase/protease/amylase  24,000 Units Oral TID AC  . loratadine  10 mg Oral Daily  . OXcarbazepine  150 mg Oral BID  . pantoprazole  40 mg Oral BID  . potassium chloride  10 mEq Oral Daily  . pravastatin  40 mg Oral q1800  . rOPINIRole  0.5 mg Oral QHS  . cyanocobalamin  2,000 mcg Oral Q1200   Continuous Infusions: . sodium chloride  Time spent: > 35 minutes    Velvet Bathe  [facility] Pager [phone]  If 7PM-7AM, please contact night-coverage at [email or web address], password [facility] 08/17/2015, 5:19 PM  LOS: 1 day

## 2015-08-17 NOTE — NC FL2 (Signed)
[place] LEVEL OF CARE SCREENING TOOL     IDENTIFICATION  Patient Name: [name] Birthdate: May 15, 1941 Sex: female Admission Date (Current Location): 08/16/2015  [facility] [place] and [place] Number:  Herbalist and Address:  [facility] [place], [place] [address], [place], [place] [address]      Provider Number: [id number]  Attending Physician Name and Address:  Velvet Bathe, MD  Relative Name and Phone Number:  Jayonni, Blom [phone] or [phone]    Current Level of Care: Hospital Recommended Level of Care: [place] Prior Approval Number:    Date Approved/Denied:   PASRR Number: [id number]  Discharge Plan: SNF    Current Diagnoses: Patient Active Problem List   Diagnosis Date Noted  . Malleolar fracture 08/16/2015  . Abnormal radionuclide bone scan 08/16/2015  . Ankle fracture, bimalleolar, closed 08/16/2015  . Bimalleolar ankle fracture   . Central chest pain 07/28/2015  . HLD (hyperlipidemia) 07/28/2015  . Fatty liver 09/09/2014  . Lumbago 07/07/2014  . Abnormality of gait 03/27/2014  . Memory loss 01/08/2014  . Exocrine pancreatic insufficiency ([place]) 12/30/2013  . Other abnormal glucose 11/29/2013  . Carcinoma of head of pancreas ([place]) 07/01/2013  . 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 Paresthesia of foot 06/06/2012  . Syncope 09/26/2011  . RHINOSINUSITIS, CHRONIC 02/03/2009  . COLONIC POLYPS 06/05/2008  . OSTEOARTHRITIS 06/04/2008  . DIARRHEA, CHRONIC 04/22/2008  . Low back pain 08/03/2007  . Chronic maxillary sinusitis 04/20/2007  . Hypothyroidism 02/26/2007  . HYPERLIPIDEMIA 02/26/2007  . ANXIETY 02/26/2007  . Adjustment disorder with mixed anxiety and depressed mood 02/26/2007  . TARDIVE DYSKINESIA 02/26/2007  . GLAUCOMA 02/26/2007  . EUSTACHIAN TUBE DYSFUNCTION 02/26/2007  . Essential hypertension 02/26/2007  . MITRAL VALVE PROLAPSE 02/26/2007  . ALLERGIC  RHINITIS 02/26/2007  . GERD 02/26/2007  . HIATAL HERNIA 02/26/2007  . OSTEOPOROSIS 02/26/2007    Orientation RESPIRATION BLADDER Height & Weight     Self, Time, Situation, Place    Continent Weight: 161 lb (73.029 kg) Height:  5\' 1"  (154.9 cm)  BEHAVIORAL SYMPTOMS/MOOD NEUROLOGICAL BOWEL NUTRITION STATUS      Continent Diet (Regular)  AMBULATORY STATUS COMMUNICATION OF NEEDS Skin   Limited Assist Verbally Surgical wounds                       Personal Care Assistance Level of Assistance  Bathing, Dressing Bathing Assistance: Limited assistance   Dressing Assistance: Limited assistance     Functional Limitations Info             SPECIAL CARE FACTORS FREQUENCY  PT (By licensed PT)     PT Frequency: 5x a week              Contractures      Additional Factors Info  Code Status        Psychotropic  Code Status Info: Full Code Allergies Info: OTHER, METOCLOPRAMIDE HCL, NIACIN, TROVAN, NORTRIPTYLINE, BENZOCAINE-RESORCINOL, CELECOXIB, GLUCOSAMINE, PHENAZOPYRIDINE HCL, SULFA ANTIBIOTICS, SULFONAMIDE DERIVATIVES     FLUoxetine (PROZAC) capsule 20 mg       Current Medications (08/17/2015):  This is the current hospital active medication list Current Facility-Administered Medications  Medication Dose Route Frequency Provider Last Rate Last Dose  . 0.9 %  sodium chloride infusion   Intravenous Continuous Rondell A Tamala Julian, MD      . acetaminophen (TYLENOL) tablet 650 mg  650 mg Oral Q6H PRN Norval Morton, MD  Or  . acetaminophen (TYLENOL) suppository 650 mg  650 mg Rectal Q6H PRN Norval Morton, MD      . albuterol (PROVENTIL) (2.5 MG/3ML) 0.083% nebulizer solution 2.5 mg  2.5 mg Nebulization Q2H PRN Norval Morton, MD      . atenolol (TENORMIN) tablet 25 mg  25 mg Oral Daily Norval Morton, MD   25 mg at 08/17/15 0944  . baclofen (LIORESAL) tablet 10 mg  10 mg Oral TID Norval Morton, MD   10 mg at 08/17/15 1737  . docusate sodium (COLACE)  capsule 200 mg  200 mg Oral QHS Norval Morton, MD   200 mg at 08/16/15 2155  . feeding supplement (BOOST HIGH PROTEIN) liquid 237 mL  1 Container Oral Daily Norval Morton, MD   237 mL at 08/17/15 0944  . FLUoxetine (PROZAC) capsule 20 mg  20 mg Oral Daily Norval Morton, MD   20 mg at 08/17/15 0944  . furosemide (LASIX) tablet 20 mg  20 mg Oral Daily Norval Morton, MD   20 mg at 08/17/15 0943  . gabapentin (NEURONTIN) capsule 800 mg  800 mg Oral TID Norval Morton, MD   800 mg at 08/17/15 1737  . HYDROcodone-acetaminophen (NORCO/VICODIN) 5-325 MG per tablet 1 tablet  1 tablet Oral Q4H PRN Norval Morton, MD   1 tablet at 08/17/15 1737  . hydrocortisone cream 1 % 1 application  1 application Topical BID Rondell A Smith, MD      . hydroxypropyl methylcellulose / hypromellose (ISOPTO TEARS / GONIOVISC) 2.5 % ophthalmic solution 1 drop  1 drop Both Eyes BID Norval Morton, MD   1 drop at 08/17/15 0946  . levothyroxine (SYNTHROID, LEVOTHROID) tablet 175 mcg  175 mcg Oral QAC breakfast Norval Morton, MD   175 mcg at 08/17/15 0726  . lipase/protease/amylase (CREON) capsule 24,000 Units  24,000 Units Oral TID AC Norval Morton, MD   24,000 Units at 08/17/15 1739  . loratadine (CLARITIN) tablet 10 mg  10 mg Oral Daily Norval Morton, MD   10 mg at 08/17/15 0943  . metoCLOPramide (REGLAN) tablet 5-10 mg  5-10 mg Oral Q8H PRN Meredith Pel, MD       Or  . metoCLOPramide (REGLAN) injection 5-10 mg  5-10 mg Intravenous Q8H PRN Meredith Pel, MD      . morphine 2 MG/ML injection 2 mg  2 mg Intravenous Q2H PRN Norval Morton, MD   2 mg at 08/17/15 1338  . ondansetron (ZOFRAN) tablet 4 mg  4 mg Oral Q6H PRN Meredith Pel, MD       Or  . ondansetron [facility]) injection 4 mg  4 mg Intravenous Q6H PRN Meredith Pel, MD      . Oxcarbazepine (TRILEPTAL) tablet 150 mg  150 mg Oral BID Norval Morton, MD   150 mg at 08/17/15 1010  . pantoprazole (PROTONIX) EC tablet 40 mg  40 mg Oral  BID Norval Morton, MD   40 mg at 08/17/15 0944  . potassium chloride (K-DUR) CR tablet 10 mEq  10 mEq Oral Daily Norval Morton, MD   10 mEq at 08/17/15 0944  . pravastatin (PRAVACHOL) tablet 40 mg  40 mg Oral q1800 Norval Morton, MD   40 mg at 08/17/15 1737  . rOPINIRole (REQUIP) tablet 0.5 mg  0.5 mg Oral QHS Norval Morton, MD   0.5 mg at 08/16/15 2154  .  vitamin B-12 (CYANOCOBALAMIN) tablet 2,000 mcg  2,000 mcg Oral Q1200 Meredith Pel, MD   2,000 mcg at 08/17/15 1152     Discharge Medications: Please see discharge summary for a list of discharge medications.  Relevant Imaging Results:  Relevant Lab Results:   Additional Information SSN [id number]  Ross Ludwig, [place]

## 2015-08-17 NOTE — Progress Notes (Signed)
Utilization review completed. Payden Bonus, RN, BSN. 

## 2015-08-17 NOTE — Progress Notes (Signed)
CRITICAL VALUE ALERT  Critical value received:  Lactic Acid 2.8  Date of notification:  08/17/15  Time of notification:  [id number]  Critical value read back:Yes.    Nurse who received alert:  Egbert Garibaldi  MD notified (1st page):  Dr. Wendee Beavers  Time of first page:  1457  MD notified (2nd page): Dr. Wendee Beavers  Time of second page: 1610  Responding MD:  Dr. Wendee Beavers  Time MD responded:  [phone]

## 2015-08-17 NOTE — Clinical Social Work Placement (Addendum)
   CLINICAL SOCIAL WORK PLACEMENT  NOTE  Date:  08/17/2015  Patient Details  Name: [name] MRN: [id number] Date of Birth: [birth year]  Clinical Social Work is seeking post-discharge placement for this patient at the [place] level of care (*CSW will initial, date and re-position this form in  chart as items are completed):  Yes   Patient/family provided with [place] Work Department's list of facilities offering this level of care within the geographic area requested by the patient (or if unable, by the patient's family).  Yes   Patient/family informed of their freedom to choose among providers that offer the needed level of care, that participate in Medicare, Medicaid or managed care program needed by the patient, have an available bed and are willing to accept the patient.  Yes   Patient/family informed of [place]'s ownership interest in [facility] and [facility], as well as of the fact that they are under no obligation to receive care at these facilities.  PASRR submitted to EDS on 08/17/15     PASRR number received on 08/17/15     Existing PASRR number confirmed on       FL2 transmitted to all facilities in geographic area requested by pt/family on 08/17/15     FL2 transmitted to all facilities within larger geographic area on 08/17/15     Patient informed that his/her managed care company has contracts with or will negotiate with certain facilities, including the following:         08-18-15   Patient/family informed of bed offers received.  Patient chooses bed at  [facility]     Physician recommends and patient chooses bed at      Patient to be transferred to   [facility] on  08-19-15.  Patient to be transferred to facility by  [facility]     Patient family notified on   08/19/15 of transfer.  Name of family member notified:   Nasteho Uram son    PHYSICIAN Please sign FL2     Additional Comment:     _______________________________________________ Ross Ludwig, LCSWA 08/17/2015, 6:31 PM

## 2015-08-17 NOTE — Evaluation (Signed)
Physical Therapy Evaluation Patient Details Name: HOLLAND KOTTLER MRN: [id number] DOB: [birth year] Today's Date: 08/17/2015   History of Present Illness  Ms. Majewski is a 75 year old female with a past medical history significant for OA, HTN, hypothyroidism, MVP, pancreatic cancer s/p [place], tardive dyskinesia, s/p total left knee replacement, and depression; who presents after falling off her couch this morning twisting her right ankle; fracture, now s/p ORIF  Clinical Impression   Patient is s/p above surgery resulting in functional limitations due to the deficits listed below (see PT Problem List).  Patient will benefit from skilled PT to increase their independence and safety with mobility to allow discharge to the venue listed below.       Follow Up Recommendations SNF    Equipment Recommendations  Rolling walker with 5" wheels;3in1 (PT)    Recommendations for Other Services OT consult     Precautions / Restrictions Precautions Precautions: Fall Restrictions RLE Weight Bearing: Non weight bearing      Mobility  Bed Mobility Overal bed mobility: Needs Assistance Bed Mobility: Supine to Sit     Supine to sit: Min guard     General bed mobility comments: Used bed rails, min guard for safety, but not needing physical assist  Transfers Overall transfer level: Needs assistance Equipment used: Rolling walker (2 wheeled);None Transfers: Sit to/from [employer] Sit to Stand: Mod assist   Squat pivot transfers: Mod assist     General transfer comment: first, performed squat pivot transfer bed to drop-arm BSC, then drop-arm BSC to recliner; cues for hand palcement, safety and technique; mod assist to guide hips; mod assit to power up with straight sit to stand from recliner; difficulty maintainging NWB RLE  Ambulation/Gait                Stairs            Wheelchair Mobility    Modified Rankin (Stroke Patients Only)       Balance                                              Pertinent Vitals/Pain Pain Assessment: 0-10 Pain Score: 7  Pain Location: R ankle after transfers Pain Descriptors / Indicators: Aching Pain Intervention(s): Limited activity within patient's tolerance;Monitored during session;Repositioned;Patient requesting pain meds-RN notified    Home Living Family/patient expects to be discharged to:: Private residence Living Arrangements: Alone Available Help at Discharge: Family;Available PRN/intermittently Type of Home: House Home Access: Stairs to enter Entrance Stairs-Rails: None Entrance Stairs-Number of Steps: 3 Home Layout: One level Home Equipment: Walker - 2 wheels;Cane - single point;Bedside commode;Tub bench      Prior Function Level of Independence: Independent         Comments: assistive device prn; was still driving     Hand Dominance   Dominant Hand: Right    Extremity/Trunk Assessment   Upper Extremity Assessment: Overall WFL for tasks assessed           Lower Extremity Assessment: Generalized weakness;RLE deficits/detail (chorea-like movements bil LEs when pt is sitting) RLE Deficits / Details: ankle immobilized in cast; postive active toe wiggle    Cervical / Trunk Assessment: Other exceptions  Communication   Communication: No difficulties  Cognition Arousal/Alertness: Awake/alert Behavior During Therapy: WFL for tasks assessed/performed Overall Cognitive Status: Within Functional Limits for tasks assessed  General Comments      Exercises        Assessment/Plan    PT Assessment Patient needs continued PT services  PT Diagnosis Difficulty walking;Generalized weakness;Acute pain   PT Problem List Decreased strength;Decreased range of motion;Decreased activity tolerance;Decreased balance;Decreased mobility;Decreased coordination;Decreased knowledge of use of DME;Decreased safety awareness;Decreased knowledge  of precautions;Pain  PT Treatment Interventions DME instruction;Gait training;Functional mobility training;Therapeutic activities;Therapeutic exercise;Patient/family education   PT Goals (Current goals can be found in the Care Plan section) Acute Rehab PT Goals Patient Stated Goal: Reports she had a good rehab experience at [facility] PT Goal Formulation: With patient Time For Goal Achievement: 08/24/15 Potential to Achieve Goals: Good    Frequency Min 3X/week   Barriers to discharge        Co-evaluation               End of Session Equipment Utilized During Treatment: Gait belt Activity Tolerance: Patient tolerated treatment well Patient left: in chair;with call bell/phone within reach Nurse Communication: Mobility status         Time: [id number] PT Time Calculation (min) (ACUTE ONLY): 29 min   Charges:   PT Evaluation $PT Eval Moderate Complexity: 1 Procedure PT Treatments $Therapeutic Activity: 8-22 mins   PT G Codes:        Quin Hoop 08/17/2015, 1:20 PM  Roney Marion, PT  Acute Rehabilitation Services Pager [phone] Office [phone]

## 2015-08-17 NOTE — Clinical Social Work Note (Addendum)
CSW spoke with patient and her son who was at bedside.  Patient and family are agreeable to going to SNF for short term rehab.  Patient and son would prefer [facility], [place], [place], or [facility].  Patient and her family were agreeable to having CSW begin bed search process.  Jones Broom. [place], MSW, [place] 08/17/2015 5:36 PM

## 2015-08-17 NOTE — Clinical Social Work Note (Signed)
Clinical Social Work Assessment  Patient Details  Name: [name] MRN: [id number] Date of Birth: 14-Apr-1941  Date of referral:  08/17/15               Reason for consult:  Facility Placement                Permission sought to share information with:  Family Supports, Customer service manager Permission granted to share information::  Yes, Verbal Permission Granted  Name::     Siobhan Moomaw [phone]  Agency::  SNF admissions  Relationship::     Contact Information:     Housing/Transportation Living arrangements for the past 2 months:  Single Family Home Source of Information:  Patient, Adult Children Patient Interpreter Needed:  None Criminal Activity/Legal Involvement Pertinent to Current Situation/Hospitalization:  No - Comment as needed Significant Relationships:  Adult Children Lives with:  Self Do you feel safe going back to the place where you live?  No (Patient feels she needs some short term rehab before she can go home.) Need for family participation in patient care:  Yes (Comment) (Patient requests to have her son involved in decision making for SNF.)  Care giving concerns:  Patient and son feel she needs some short term rehab before she is able to return back home.   Social Worker assessment / plan:  Patient is a 75 year old female who is alert and oriented x4.  Patient is pleasant and talkative, and lives alone.  Patient states she has been to rehab in the past and is aware of what to expect.  CSW explained role and process for looking for  SNFs.  Patient's son was at bedside, and asked questions regarding how insurance will pay for stay.  Patient and family expressed they would like [facility], [place], [place], or [facility] for short term rehab.  Patient and son had stated they would like to have patient go to SNF in [facility].  Patient and son expressed they did not have any other questions.  Employment status:  Retired Radiation protection practitioner:  Medicare PT Recommendations:  [place] / Referral to community resources:     Patient/Family's Response to care:  Patient and family agreeable to going to SNF for short term rehab.  Patient/Family's Understanding of and Emotional Response to Diagnosis, Current Treatment, and Prognosis:  Patient and family aware of current diagnosis and treatment plan.  Emotional Assessment Appearance:  Appears stated age Attitude/Demeanor/Rapport:    Affect (typically observed):  Appropriate, Calm, Stable, Pleasant Orientation:  Oriented to Self, Oriented to Place, Oriented to  Time, Oriented to Situation Alcohol / Substance use:  Not Applicable Psych involvement (Current and /or in the community):  No (Comment)  Discharge Needs  Concerns to be addressed:  Lack of Support Readmission within the last 30 days:  No Current discharge risk:  Lack of support system, Lives alone Barriers to Discharge:  No Barriers Identified   Anell Barr 08/17/2015, 5:48 PM

## 2015-08-18 DIAGNOSIS — S82841S Displaced bimalleolar fracture of right lower leg, sequela: Secondary | ICD-10-CM

## 2015-08-18 LAB — URINE CULTURE: Culture: NO GROWTH

## 2015-08-18 MED ORDER — DIPHENHYDRAMINE HCL 12.5 MG/5ML PO ELIX
12.5000 mg | ORAL_SOLUTION | Freq: Three times a day (TID) | ORAL | Status: DC | PRN
  Administered 2015-08-19: 12.5 mg via ORAL
  Filled 2015-08-18 (×2): qty 10

## 2015-08-18 MED ORDER — RIVAROXABAN 10 MG PO TABS: 10.0000 mg | ORAL_TABLET | Freq: Every day | ORAL | Status: DC

## 2015-08-18 MED ORDER — HYDROCODONE-ACETAMINOPHEN 5-325 MG PO TABS: 2.0000 | ORAL_TABLET | Freq: Four times a day (QID) | ORAL | Status: DC | PRN

## 2015-08-18 MED ORDER — RIVAROXABAN 10 MG PO TABS
10.0000 mg | ORAL_TABLET | Freq: Every day | ORAL | Status: DC
  Administered 2015-08-18: 10 mg via ORAL
  Filled 2015-08-18: qty 1

## 2015-08-18 NOTE — Clinical Social Work Note (Deleted)
BSW intern has recently spoken with patient's daughter, Arbie Cookey by phone to discuss discharge disposition. At this time, Patient's daughter seemed to be very exhausted, confused, and frustrated in reference to making the most appropriate decision as far as facility placement. Patient is currently a resident at [facility] (in the independent living section). Patient's daughter stated that the option of making patient a long-term resident has been in limbo for quite a while. Patient's [place], has considered possibly moving in with patient at facility and receiving further assistance from hospice. Patient has spoken with the independent living director of the facility, Waldemar Dickens regarding this possible living arrangement. Saralyn Pilar has informed patient's daughter that this option would not be a problem. Patient also plans to speak with a hospice liaison to gain more knowledge on whether or not this would be a beneficial plan. Patient's MD has made patient's daughter aware that patient's life expectancy is between 1-2 weeks. Patient's daughter has requested to speak with CSW for more detailed guidance regarding facility placement. BSW intern made patient's daughter aware that patient has not been evaluated by PT. However, in the event that PT recommends SNF, would she be agreeable? Patient said "yes, but as of right now, I would be highly surprised if they even gave [place] that option due to the fact that my mom doesn't have much longer to live." Patient's daughter is very supportive and seeking to receive the most beneficial post-discharge plan for patient. BSW intern has faxed patient's updated clinical back to [facility]. BSW intern to contact CSW regarding patient's daughters' request to set up urgent meeting.   BSW intern remains available.   [place] intern [phone]

## 2015-08-18 NOTE — Progress Notes (Signed)
Pt stable Right foor perfused and sensate Plan dc am - nwb - f/u next friday3/31/2017

## 2015-08-18 NOTE — Progress Notes (Signed)
[facility] PROGRESS NOTE  [name] [id number] DOB: 08-07-1940 DOA: 08/16/2015 PCP: [name], MD  Assessment/Plan: Principal Problem:   Malleolar fracture/Ankle fracture, bimalleolar, closed - Ortho managing - start d/c planning most likely d/c next am if ok with ortho  Lactic acidosis - etiology uncertain, VSS - No reported abdominal discomfort or new complaints - No signs of systemic hypoperfusion - Reassess lactic acid levels next am. - obtain urine and blood cultures  Active Problems:   Hypothyroidism - continue synthroid    TARDIVE DYSKINESIA - continue current regimen    Essential hypertension   GERD - stable on protonix    HLD (hyperlipidemia) - stable continue statin   Code Status: full Family Communication: No family at bedside Disposition Plan: Pending improvement in condition    Consultants:  Ortho  Procedures:  Please see ortho notes  Antibiotics:  none  HPI/Subjective: Pt has no new complaints today. Denies any fevers or any new problems.   Objective: Filed Vitals:   08/18/15 0552 08/18/15 1527  BP: 147/62 125/53  Pulse: 76 76  Temp: 97.9 F (36.6 C) 98.7 F (37.1 C)  Resp: 18 18    Intake/Output Summary (Last 24 hours) at 08/18/15 1842 Last data filed at 08/18/15 1200  Gross per 24 hour  Intake   1420 ml  Output   1950 ml  Net   -530 ml   Filed Weights   08/16/15 0037 08/16/15 0623  Weight: 68.04 kg (150 lb) 73.029 kg (161 lb)    Exam:   General:  Pt in nad, alert and awake  Cardiovascular: rrr, no mrg  Respiratory: cta bl, no wheezes, no increased wob  Abdomen: soft, nd, nt  Musculoskeletal: no clubbing   Data Reviewed: Basic Metabolic Panel:  Recent Labs Lab 08/16/15 0654 08/17/15 0307  NA 143 139  K 4.0 3.8  CL 103 103  CO2 28 29  GLUCOSE 103* 109*  BUN 13 8  CREATININE 0.85 0.64  CALCIUM 8.6* 8.1*   Liver Function Tests:  Recent Labs Lab 08/17/15 0307  AST 20  ALT 17   ALKPHOS 102  BILITOT 1.0  PROT 5.5*  ALBUMIN 3.0*   No results for input(s): LIPASE, AMYLASE in the last 168 hours. No results for input(s): AMMONIA in the last 168 hours. CBC:  Recent Labs Lab 08/16/15 0310  WBC 8.4  NEUTROABS 5.4  HGB 12.0  HCT 36.9  MCV 97.6  PLT 179   Cardiac Enzymes: No results for input(s): CKTOTAL, CKMB, CKMBINDEX, TROPONINI in the last 168 hours. BNP (last 3 results) No results for input(s): BNP in the last 8760 hours.  ProBNP (last 3 results) No results for input(s): PROBNP in the last 8760 hours.  CBG: No results for input(s): GLUCAP in the last 168 hours.  Recent Results (from the past 240 hour(s))  Surgical pcr screen     Status: None   Collection Time: 08/16/15  1:39 PM  Result Value Ref Range Status   MRSA, PCR NEGATIVE NEGATIVE Final   Staphylococcus aureus NEGATIVE NEGATIVE Final    Comment:        The Xpert SA Assay (FDA approved for NASAL specimens in patients over 45 years of age), is one component of a comprehensive surveillance program.  Test performance has been validated by [facility] for patients greater than or equal to 3 year old. It is not intended to diagnose infection nor to guide or monitor treatment.   Urine culture  Status: None   Collection Time: 08/17/15  5:27 PM  Result Value Ref Range Status   Specimen Description URINE, RANDOM  Final   Special Requests NONE  Final   Culture NO GROWTH 1 DAY  Final   Report Status 08/18/2015 FINAL  Final  Culture, blood (Routine X 2) w Reflex to ID Panel     Status: None (Preliminary result)   Collection Time: 08/17/15  5:55 PM  Result Value Ref Range Status   Specimen Description BLOOD LEFT ANTECUBITAL  Final   Special Requests IN PEDIATRIC BOTTLE 2CC  Final   Culture NO GROWTH < 24 HOURS  Final   Report Status PENDING  Incomplete  Culture, blood (Routine X 2) w Reflex to ID Panel     Status: None (Preliminary result)   Collection Time: 08/17/15  6:05 PM   Result Value Ref Range Status   Specimen Description BLOOD LEFT HAND  Final   Special Requests IN PEDIATRIC BOTTLE 2CC  Final   Culture NO GROWTH < 24 HOURS  Final   Report Status PENDING  Incomplete     Studies: No results found.  Scheduled Meds: . atenolol  25 mg Oral Daily  . baclofen  10 mg Oral TID  . docusate sodium  200 mg Oral QHS  . feeding supplement  1 Container Oral Daily  . FLUoxetine  20 mg Oral Daily  . furosemide  20 mg Oral Daily  . gabapentin  800 mg Oral TID  . hydrocortisone cream  1 application Topical BID  . hydroxypropyl methylcellulose / hypromellose  1 drop Both Eyes BID  . levothyroxine  175 mcg Oral QAC breakfast  . lipase/protease/amylase  24,000 Units Oral TID AC  . loratadine  10 mg Oral Daily  . OXcarbazepine  150 mg Oral BID  . pantoprazole  40 mg Oral BID  . potassium chloride  10 mEq Oral Daily  . pravastatin  40 mg Oral q1800  . rivaroxaban  10 mg Oral QHS  . rOPINIRole  0.5 mg Oral QHS  . cyanocobalamin  2,000 mcg Oral Q1200   Continuous Infusions: . sodium chloride 75 mL/hr at 08/17/15 1915    Time spent: > 35 minutes    Velvet Bathe  [facility] Pager [phone]  If 7PM-7AM, please contact night-coverage at [email or web address], password [email or web address] 08/18/2015, 6:42 PM  LOS: 2 days

## 2015-08-18 NOTE — Clinical Social Work Note (Signed)
BSW intern has spoken with patient's son, [name] to make him aware that patient has a bed at [facility]. Patient scheduled to d/c tomorrow, 03/22.  BSW intern remains available.  [place] intern [phone]

## 2015-08-18 NOTE — Clinical Social Work Note (Signed)
Patient a/o x4. BSW intern has met with patient and patient's son, [name] at bedside to present bed offers. Patient has expressed interest in [facility] SNF. Patient states that she is an [employer] and was also a short-term resident about 5-10 years ago. In the event that [facility] is unable to extend a bed offer, patient is interested in d/c to [facility]. Patient's son made BSW intern aware that as of yesterday, 03/21, patient's MD is planning to d/c patient on tomorrow, 03/22. BSW intern has contacted [employer] (admissions [facility] [place]) in reference to making bed offer. At this time BSW intern was unable to reach admissions coordinator however, has left a detailed voicemail. BSW intern to f/u with patient regarding facility placement.   BSW intern remains available.  [place] intern [phone]

## 2015-08-18 NOTE — Progress Notes (Signed)
ANTICOAGULATION CONSULT NOTE - Initial Consult  Pharmacy Consult for Xarelto  Indication: VTE prophylaxis  Allergies  Allergen Reactions  . Other Other (See Comments)    According to patient Trilafor and Reglan cause Tardive Dyskinesia  . Metoclopramide Hcl Other (See Comments)    Shaking; caused tardive dyskinesia  . Niacin Other (See Comments)    shaking  . Trovan [Alatrofloxacin Mesylate] Other (See Comments)    Caused shaking and nervousness  . Nortriptyline Other (See Comments)    Dizziness   . Benzocaine-Resorcinol Rash  . Celecoxib Other (See Comments)    unknown  . Glucosamine Other (See Comments)    unknown  . Phenazopyridine Hcl Other (See Comments)    unknown  . Sulfa Antibiotics Nausea And Vomiting and Rash  . Sulfonamide Derivatives Nausea And Vomiting and Rash    Patient Measurements: Height: 5\' 1"  (154.9 cm) Weight: 161 lb (73.029 kg) IBW/kg (Calculated) : 47.8  Vital Signs: Temp: 98.7 F (37.1 C) (03/21 1527) Temp Source: Oral (03/21 1527) BP: 125/53 mmHg (03/21 1527) Pulse Rate: 76 (03/21 1527)  Labs:  Recent Labs  08/16/15 0310 08/16/15 0654 08/17/15 0307  HGB 12.0  --   --   HCT 36.9  --   --   PLT 179  --   --   CREATININE  --  0.85 0.64    Estimated Creatinine Clearance: 55.5 mL/min (by C-G formula based on Cr of 0.64).   Medical History: Past Medical History  Diagnosis Date  . Osteoarthritis   . Hiatal hernia   . Chronic maxillary sinusitis     neti pot  . Depression     alone a lot  . Eustachian tube dysfunction   . Tardive dyskinesia     possibly reglan, vitamin E helps  . Mitral valve prolapse     antibiotics before dental procedures  . GERD (gastroesophageal reflux disease)   . Allergic rhinitis   . Glaucoma   . Hypothyroid   . Hypertension   . Heart murmur     hx. "MVP" -predental antibiotics  . Memory loss     short term memory loss  . Urgency of urination     some UTI in past  . Stroke [facility])     mini  storkes left leg paraylsis. patient denies weakness 01/08/14.   . Cancer ([place]) 07/10/13    Pancreatic cancer with MRI scan 06-19-13    Medications:  Prescriptions prior to admission  Medication Sig Dispense Refill Last Dose  . acetaminophen (TYLENOL) 325 MG tablet Take 650 mg by mouth every 6 (six) hours as needed for mild pain, moderate pain or fever. Reported on 07/31/2015   unk  . acetaminophen-codeine (TYLENOL #3) 300-30 MG tablet Take 1 tablet by mouth every 4 (four) hours as needed for moderate pain or severe pain. Reported on 07/31/2015   08/14/2015  . aluminum & magnesium hydroxide-simethicone (MYLANTA) 500-450-40 MG/5ML suspension Take 5 mLs by mouth every 6 (six) hours as needed for indigestion. Reported on 07/31/2015   unk  . aspirin 81 MG tablet Take 81 mg by mouth at bedtime.    08/14/2015  . atenolol (TENORMIN) 25 MG tablet Take 1 tablet by mouth  every morning 90 tablet 3 08/15/2015 at 1100  . baclofen (LIORESAL) 10 MG tablet Take 1 tablet (10 mg total) by mouth 3 (three) times daily. 90 each 6 08/15/2015 at Unknown time  . carboxymethylcellulose (REFRESH TEARS) 0.5 % SOLN Place 1 drop into both eyes 2 (two)  times daily.    08/15/2015 at Unknown time  . cholecalciferol (VITAMIN D) 1000 UNITS tablet Take 1,000 Units by mouth 2 (two) times daily.    08/15/2015 at am  . cyanocobalamin 2000 MCG tablet Take 2,000 mcg by mouth daily at 12 noon.    08/15/2015 at Unknown time  . docusate sodium (COLACE) 100 MG capsule Take 200 mg by mouth at bedtime.   08/14/2015  . feeding supplement (BOOST HIGH PROTEIN) LIQD Take 1 Container by mouth daily.   08/15/2015 at Unknown time  . FLUoxetine (PROZAC) 20 MG capsule Take 1 capsule (20 mg total) by mouth daily. 90 capsule 2 08/15/2015 at Unknown time  . furosemide (LASIX) 20 MG tablet Take 1 tablet (20 mg total) by mouth daily. 90 tablet 3 08/15/2015 at Unknown time  . gabapentin (NEURONTIN) 400 MG capsule 2 caps 3 times daily and 3 at hs (Patient taking differently:  800 mg 3 (three) times daily. 2 caps 3 times daily and 3 at hs) 270 capsule 6 08/15/2015 at Unknown time  . hydrocortisone cream 1 % Apply 1 application topically 2 (two) times daily. For rectal cyst   08/15/2015 at Unknown time  . levothyroxine (SYNTHROID, LEVOTHROID) 175 MCG tablet Take 1 tablet by mouth  daily before breakfast 90 tablet 2 08/15/2015 at Unknown time  . lipase/protease/amylase (CREON) 12000 UNITS CPEP capsule Take 2 capsules (24,000 Units total) by mouth 3 (three) times daily before meals. 270 capsule 5 08/15/2015 at Unknown time  . loratadine (CLARITIN) 10 MG tablet Take 10 mg by mouth daily.   08/15/2015 at Unknown time  . lovastatin (MEVACOR) 40 MG tablet Take 1 tablet by mouth at  bedtime 90 tablet 2 08/14/2015  . meclizine (ANTIVERT) 25 MG tablet TAKE 1 TABLET BY MOUTH EVERY 6 HOURS AS NEEDED FOR NAUSEA OR DIZZINESS 100 tablet 1 unk  . Multiple Vitamin (MULTIVITAMIN WITH MINERALS) TABS tablet Take 1 tablet by mouth daily.   08/15/2015 at Unknown time  . omeprazole (PRILOSEC OTC) 20 MG tablet Take 20 mg by mouth 2 (two) times daily.    08/15/2015 at am  . OXcarbazepine (TRILEPTAL) 150 MG tablet TAKE 1/2 TABLET TWICE A DAY FOR 1 WEEK THEN 1 TABLET TWICE A DAY (Patient taking differently: Take 150 mg by mouth 2 (two) times daily. ) 60 tablet 6 08/15/2015 at am  . polycarbophil (FIBERCON) 625 MG tablet Take 625 mg by mouth daily.    08/15/2015 at Unknown time  . potassium chloride (K-DUR) 10 MEQ tablet Take 1 tablet (10 mEq total) by mouth daily. 90 tablet 3 08/15/2015 at Unknown time  . promethazine (PHENERGAN) 25 MG tablet Take 25 mg by mouth every 6 (six) hours as needed for nausea or vomiting.   unk  . rOPINIRole (REQUIP) 0.5 MG tablet Take 1 tablet (0.5 mg total) by mouth at bedtime. 30 tablet 6 08/14/2015  . Simethicone 180 MG CAPS Take 180 mg by mouth 4 (four) times daily.    08/15/2015 at Unknown time  . vitamin E 400 UNIT capsule Take 800 Units by mouth 2 (two) times daily. For tartive  dyskinesia   08/15/2015 at am    Assessment: 52 YOF s/p closure of malleolar fracture/anke fracture to start Xarelto for VTE prophylaxis. H/H and Plt wnl   Goal of Therapy:  VTE prophylaxis  Monitor platelets by anticoagulation protocol: Yes   Plan:  Xarelto 10 mg daily  Monitor for s/s of bleeding   [name], PharmD., BCPS Clinical Pharmacist  Pager [phone]

## 2015-08-18 NOTE — Discharge Instructions (Addendum)
Non weight bearing right leg xarelto for dvt prophylaxis Return to clinic 08/28/2015  Information on my medicine - XARELTO (Rivaroxaban)  This medication education was reviewed with me or my healthcare representative as part of my discharge preparation.  The pharmacist that spoke with me during my hospital stay was:  [employer], Pharm.D.  Why was Xarelto prescribed for you? Xarelto was prescribed for you to reduce the risk of blood clots forming after orthopedic surgery. The medical term for these abnormal blood clots is venous thromboembolism (VTE).  What do you need to know about xarelto ? Take your Xarelto ONCE DAILY at the same time every day. You may take it either with or without food.  If you have difficulty swallowing the tablet whole, you may crush it and mix in applesauce just prior to taking your dose.  Take Xarelto exactly as prescribed by your doctor and DO NOT stop taking Xarelto without talking to the doctor who prescribed the medication.  Stopping without other VTE prevention medication to take the place of Xarelto may increase your risk of developing a clot.  After discharge, you should have regular check-up appointments with your healthcare provider that is prescribing your Xarelto.    What do you do if you miss a dose? If you miss a dose, take it as soon as you remember on the same day then continue your regularly scheduled once daily regimen the next day. Do not take two doses of Xarelto on the same day.   Important Safety Information A possible side effect of Xarelto is bleeding. You should call your healthcare provider right away if you experience any of the following: ? Bleeding from an injury or your nose that does not stop. ? Unusual colored urine (red or dark brown) or unusual colored stools (red or black). ? Unusual bruising for unknown reasons. ? A serious fall or if you hit your head (even if there is no bleeding).  Some medicines may  interact with Xarelto and might increase your risk of bleeding while on Xarelto. To help avoid this, consult your healthcare provider or pharmacist prior to using any new prescription or non-prescription medications, including herbals, vitamins, non-steroidal anti-inflammatory drugs (NSAIDs) and supplements.  This website has more information on Xarelto: [email or web address]

## 2015-08-19 DIAGNOSIS — M25562 Pain in left knee: Secondary | ICD-10-CM | POA: Diagnosis not present

## 2015-08-19 DIAGNOSIS — G8911 Acute pain due to trauma: Secondary | ICD-10-CM | POA: Diagnosis not present

## 2015-08-19 DIAGNOSIS — Z9181 History of falling: Secondary | ICD-10-CM | POA: Diagnosis not present

## 2015-08-19 DIAGNOSIS — R262 Difficulty in walking, not elsewhere classified: Secondary | ICD-10-CM | POA: Diagnosis not present

## 2015-08-19 DIAGNOSIS — R259 Unspecified abnormal involuntary movements: Secondary | ICD-10-CM | POA: Diagnosis not present

## 2015-08-19 DIAGNOSIS — K8681 Exocrine pancreatic insufficiency: Secondary | ICD-10-CM | POA: Diagnosis not present

## 2015-08-19 DIAGNOSIS — E785 Hyperlipidemia, unspecified: Secondary | ICD-10-CM | POA: Diagnosis not present

## 2015-08-19 DIAGNOSIS — G629 Polyneuropathy, unspecified: Secondary | ICD-10-CM | POA: Diagnosis not present

## 2015-08-19 DIAGNOSIS — S8261XE Displaced fracture of lateral malleolus of right fibula, subsequent encounter for open fracture type I or II with routine healing: Secondary | ICD-10-CM | POA: Diagnosis not present

## 2015-08-19 DIAGNOSIS — M6281 Muscle weakness (generalized): Secondary | ICD-10-CM | POA: Diagnosis not present

## 2015-08-19 DIAGNOSIS — I10 Essential (primary) hypertension: Secondary | ICD-10-CM | POA: Diagnosis not present

## 2015-08-19 DIAGNOSIS — S82899S Other fracture of unspecified lower leg, sequela: Secondary | ICD-10-CM | POA: Diagnosis not present

## 2015-08-19 DIAGNOSIS — R2689 Other abnormalities of gait and mobility: Secondary | ICD-10-CM | POA: Diagnosis not present

## 2015-08-19 DIAGNOSIS — S82841D Displaced bimalleolar fracture of right lower leg, subsequent encounter for closed fracture with routine healing: Secondary | ICD-10-CM | POA: Diagnosis not present

## 2015-08-19 DIAGNOSIS — K219 Gastro-esophageal reflux disease without esophagitis: Secondary | ICD-10-CM | POA: Diagnosis not present

## 2015-08-19 DIAGNOSIS — G8929 Other chronic pain: Secondary | ICD-10-CM | POA: Diagnosis not present

## 2015-08-19 DIAGNOSIS — T1490 Injury, unspecified: Secondary | ICD-10-CM | POA: Diagnosis not present

## 2015-08-19 DIAGNOSIS — J302 Other seasonal allergic rhinitis: Secondary | ICD-10-CM | POA: Diagnosis not present

## 2015-08-19 DIAGNOSIS — M25572 Pain in left ankle and joints of left foot: Secondary | ICD-10-CM | POA: Diagnosis not present

## 2015-08-19 DIAGNOSIS — K5900 Constipation, unspecified: Secondary | ICD-10-CM | POA: Diagnosis not present

## 2015-08-19 DIAGNOSIS — G9009 Other idiopathic peripheral autonomic neuropathy: Secondary | ICD-10-CM | POA: Diagnosis not present

## 2015-08-19 DIAGNOSIS — R238 Other skin changes: Secondary | ICD-10-CM | POA: Diagnosis not present

## 2015-08-19 DIAGNOSIS — G8921 Chronic pain due to trauma: Secondary | ICD-10-CM | POA: Diagnosis not present

## 2015-08-19 DIAGNOSIS — S8261XD Displaced fracture of lateral malleolus of right fibula, subsequent encounter for closed fracture with routine healing: Secondary | ICD-10-CM | POA: Diagnosis not present

## 2015-08-19 DIAGNOSIS — B350 Tinea barbae and tinea capitis: Secondary | ICD-10-CM | POA: Diagnosis not present

## 2015-08-19 DIAGNOSIS — H04129 Dry eye syndrome of unspecified lacrimal gland: Secondary | ICD-10-CM | POA: Diagnosis not present

## 2015-08-19 DIAGNOSIS — F418 Other specified anxiety disorders: Secondary | ICD-10-CM | POA: Diagnosis not present

## 2015-08-19 DIAGNOSIS — G2401 Drug induced subacute dyskinesia: Secondary | ICD-10-CM | POA: Diagnosis not present

## 2015-08-19 DIAGNOSIS — E639 Nutritional deficiency, unspecified: Secondary | ICD-10-CM | POA: Diagnosis not present

## 2015-08-19 DIAGNOSIS — K6289 Other specified diseases of anus and rectum: Secondary | ICD-10-CM | POA: Diagnosis not present

## 2015-08-19 DIAGNOSIS — R42 Dizziness and giddiness: Secondary | ICD-10-CM | POA: Diagnosis not present

## 2015-08-19 DIAGNOSIS — M25571 Pain in right ankle and joints of right foot: Secondary | ICD-10-CM | POA: Diagnosis not present

## 2015-08-19 DIAGNOSIS — M9721XA Periprosthetic fracture around internal prosthetic right ankle joint, initial encounter: Secondary | ICD-10-CM | POA: Diagnosis not present

## 2015-08-19 DIAGNOSIS — R110 Nausea: Secondary | ICD-10-CM | POA: Diagnosis not present

## 2015-08-19 DIAGNOSIS — F411 Generalized anxiety disorder: Secondary | ICD-10-CM | POA: Diagnosis not present

## 2015-08-19 DIAGNOSIS — K449 Diaphragmatic hernia without obstruction or gangrene: Secondary | ICD-10-CM | POA: Diagnosis not present

## 2015-08-19 DIAGNOSIS — E569 Vitamin deficiency, unspecified: Secondary | ICD-10-CM | POA: Diagnosis not present

## 2015-08-19 DIAGNOSIS — F329 Major depressive disorder, single episode, unspecified: Secondary | ICD-10-CM | POA: Diagnosis not present

## 2015-08-19 DIAGNOSIS — R278 Other lack of coordination: Secondary | ICD-10-CM | POA: Diagnosis not present

## 2015-08-19 DIAGNOSIS — F332 Major depressive disorder, recurrent severe without psychotic features: Secondary | ICD-10-CM | POA: Diagnosis not present

## 2015-08-19 DIAGNOSIS — M62838 Other muscle spasm: Secondary | ICD-10-CM | POA: Diagnosis not present

## 2015-08-19 LAB — URINE CULTURE

## 2015-08-19 LAB — LACTIC ACID, PLASMA: LACTIC ACID, VENOUS: 1.7 mmol/L (ref 0.5–2.0)

## 2015-08-19 MED ORDER — BACLOFEN 10 MG PO TABS: 10.0000 mg | ORAL_TABLET | Freq: Three times a day (TID) | ORAL | Status: DC

## 2015-08-19 MED ORDER — OXCARBAZEPINE 150 MG PO TABS: 150.0000 mg | ORAL_TABLET | Freq: Two times a day (BID) | ORAL | Status: DC

## 2015-08-19 NOTE — Progress Notes (Signed)
Report called to Lattie Haw, RN @ [facility].

## 2015-08-19 NOTE — Discharge Summary (Signed)
Physician Discharge Summary  [name] [id number] DOB: March 14, 1941 DOA: 08/16/2015  PCP: [name], MD  Admit date: 08/16/2015 Discharge date: 08/19/2015  Time spent: > 35 minutes  Recommendations for Outpatient Follow-up:  1. Ensure patient follows up with orthopaedic surgery for post procedure evaluation and recommendations.   Discharge Diagnoses:  Principal Problem:   Malleolar fracture Active Problems:   Hypothyroidism   TARDIVE DYSKINESIA   Essential hypertension   GERD   HLD (hyperlipidemia)   Abnormal radionuclide bone scan   Bimalleolar ankle fracture   Ankle fracture, bimalleolar, closed   Discharge Condition: stable  Diet recommendation: General diet  Filed Weights   08/16/15 0037 08/16/15 0623  Weight: 68.04 kg (150 lb) 73.029 kg (161 lb)    History of present illness:  Pt is a 75 y/o who presented with a bimalleolar fracture  Hospital Course:  Principal Problem:  Malleolar fracture/Ankle fracture, bimalleolar, closed - Ortho managed while patient was in house and had reduction internal fixation of ankle fracture  Lactic acidosis - etiology uncertain,  - resolved. Patient during hospital stay did not have any hypotensive episodes. Cultures remain negative. No fevers  Active Problems:  Hypothyroidism - continue synthroid   TARDIVE DYSKINESIA - continue current regimen   Essential hypertension  GERD - stable continue PPI   HLD (hyperlipidemia) - stable continue statin   Procedures:  As above  Consultations:  ortho  Discharge Exam: Filed Vitals:   08/18/15 1527 08/18/15 2117  BP: 125/53 160/78  Pulse: 76 90  Temp: 98.7 F (37.1 C) 98.7 F (37.1 C)  Resp: 18 18    General: Pt in nad, alert and awake Cardiovascular: rrr, no mrg Respiratory: cta bl, no wheezes  Discharge Instructions   Discharge Instructions    Call MD for:  severe uncontrolled pain    Complete by:  As directed      Call MD for:   temperature >100.4    Complete by:  As directed      Diet - low sodium heart healthy    Complete by:  As directed      Discharge instructions    Complete by:  As directed   Please follow up with your primary care physician at SNF.  Also follow up with orthopaedic surgery as per their recommendations.     Increase activity slowly    Complete by:  As directed           Current Discharge Medication List    START taking these medications   Details  HYDROcodone-acetaminophen (NORCO/VICODIN) 5-325 MG tablet Take 2 tablets by mouth every 6 (six) hours as needed for moderate pain. Qty: 30 tablet, Refills: 0    rivaroxaban (XARELTO) 10 MG TABS tablet Take 1 tablet (10 mg total) by mouth daily. Qty: 12 tablet, Refills: 0      CONTINUE these medications which have CHANGED   Details  baclofen (LIORESAL) 10 MG tablet Take 1 tablet (10 mg total) by mouth 3 (three) times daily. Qty: 30 each, Refills: 0    OXcarbazepine (TRILEPTAL) 150 MG tablet Take 1 tablet (150 mg total) by mouth 2 (two) times daily. Qty: 60 tablet, Refills: 0      CONTINUE these medications which have NOT CHANGED   Details  acetaminophen (TYLENOL) 325 MG tablet Take 650 mg by mouth every 6 (six) hours as needed for mild pain, moderate pain or fever. Reported on 07/31/2015    aluminum & magnesium hydroxide-simethicone (MYLANTA) 500-450-40 MG/5ML suspension Take 5  mLs by mouth every 6 (six) hours as needed for indigestion. Reported on 07/31/2015    atenolol (TENORMIN) 25 MG tablet Take 1 tablet by mouth  every morning Qty: 90 tablet, Refills: 3    carboxymethylcellulose (REFRESH TEARS) 0.5 % SOLN Place 1 drop into both eyes 2 (two) times daily.     cholecalciferol (VITAMIN D) 1000 UNITS tablet Take 1,000 Units by mouth 2 (two) times daily.     cyanocobalamin 2000 MCG tablet Take 2,000 mcg by mouth daily at 12 noon.     docusate sodium (COLACE) 100 MG capsule Take 200 mg by mouth at bedtime.    feeding supplement (BOOST  HIGH PROTEIN) LIQD Take 1 Container by mouth daily.    FLUoxetine (PROZAC) 20 MG capsule Take 1 capsule (20 mg total) by mouth daily. Qty: 90 capsule, Refills: 2    furosemide (LASIX) 20 MG tablet Take 1 tablet (20 mg total) by mouth daily. Qty: 90 tablet, Refills: 3    gabapentin (NEURONTIN) 400 MG capsule 2 caps 3 times daily and 3 at hs Qty: 270 capsule, Refills: 6    hydrocortisone cream 1 % Apply 1 application topically 2 (two) times daily. For rectal cyst    levothyroxine (SYNTHROID, LEVOTHROID) 175 MCG tablet Take 1 tablet by mouth  daily before breakfast Qty: 90 tablet, Refills: 2    lipase/protease/amylase (CREON) 12000 UNITS CPEP capsule Take 2 capsules (24,000 Units total) by mouth 3 (three) times daily before meals. Qty: 270 capsule, Refills: 5   Associated Diagnoses: Exocrine pancreatic insufficiency (HCC)    loratadine (CLARITIN) 10 MG tablet Take 10 mg by mouth daily.    lovastatin (MEVACOR) 40 MG tablet Take 1 tablet by mouth at  bedtime Qty: 90 tablet, Refills: 2    meclizine (ANTIVERT) 25 MG tablet TAKE 1 TABLET BY MOUTH EVERY 6 HOURS AS NEEDED FOR NAUSEA OR DIZZINESS Qty: 100 tablet, Refills: 1    Multiple Vitamin (MULTIVITAMIN WITH MINERALS) TABS tablet Take 1 tablet by mouth daily.    omeprazole (PRILOSEC OTC) 20 MG tablet Take 20 mg by mouth 2 (two) times daily.     polycarbophil (FIBERCON) 625 MG tablet Take 625 mg by mouth daily.     potassium chloride (K-DUR) 10 MEQ tablet Take 1 tablet (10 mEq total) by mouth daily. Qty: 90 tablet, Refills: 3    promethazine (PHENERGAN) 25 MG tablet Take 25 mg by mouth every 6 (six) hours as needed for nausea or vomiting.    rOPINIRole (REQUIP) 0.5 MG tablet Take 1 tablet (0.5 mg total) by mouth at bedtime. Qty: 30 tablet, Refills: 6    vitamin E 400 UNIT capsule Take 800 Units by mouth 2 (two) times daily. For tartive dyskinesia      STOP taking these medications     acetaminophen-codeine (TYLENOL #3) 300-30  MG tablet      aspirin 81 MG tablet      Simethicone 180 MG CAPS        Allergies  Allergen Reactions  . Other Other (See Comments)    According to patient Trilafor and Reglan cause Tardive Dyskinesia  . Metoclopramide Hcl Other (See Comments)    Shaking; caused tardive dyskinesia  . Niacin Other (See Comments)    shaking  . Trovan [Alatrofloxacin Mesylate] Other (See Comments)    Caused shaking and nervousness  . Nortriptyline Other (See Comments)    Dizziness   . Benzocaine-Resorcinol Rash  . Celecoxib Other (See Comments)    unknown  .  Glucosamine Other (See Comments)    unknown  . Phenazopyridine Hcl Other (See Comments)    unknown  . Sulfa Antibiotics Nausea And Vomiting and Rash  . Sulfonamide Derivatives Nausea And Vomiting and Rash      The results of significant diagnostics from this hospitalization (including imaging, microbiology, ancillary and laboratory) are listed below for reference.    Significant Diagnostic Studies: Dg Chest 2 View  07/28/2015  CLINICAL DATA:  Central chest pain since this morning radiating to left polyp. Dizziness. EXAM: CHEST  2 VIEW COMPARISON:  09/02/2013 FINDINGS: The heart size and mediastinal contours are within normal limits. Both lungs are clear. Elevation of right hemidiaphragm again noted. Several mild mid thoracic spine vertebral body compression fracture deformities are noted. IMPRESSION: Stable elevation of right hemidiaphragm. No active cardiopulmonary disease. Electronically Signed   By: Earle Gell M.D.   On: 07/28/2015 17:22   Dg Ankle 2 Views Right  08/16/2015  CLINICAL DATA:  Right ankle fracture EXAM: DG C-ARM 61-120 MIN; RIGHT ANKLE - 2 VIEW COMPARISON:  Film from earlier in the same day FLUOROSCOPY TIME:  Radiation Exposure Index (as provided by the fluoroscopic device): Not available If the device does not provide the exposure index: Fluoroscopy Time:  11 seconds Number of Acquired Images:  2 FINDINGS: Fixation  sideplate is noted along the distal fibula with a single fixation screw traversing the medial malleolus. Fracture fragments are in near anatomic alignment. IMPRESSION: ORIF of right ankle fracture. Electronically Signed   By: Inez Catalina M.D.   On: 08/16/2015 18:14   Dg Ankle Complete Right  08/16/2015  CLINICAL DATA:  Status post fall off couch while sleeping, with right ankle pain and swelling. Initial encounter. EXAM: RIGHT ANKLE - COMPLETE 3+ VIEW COMPARISON:  Right foot radiographs performed 05/09/2014 FINDINGS: There are mildly displaced medial and lateral malleolar fractures, and question of a tiny avulsion fracture at the posterior malleolus. There is mild medial widening of the ankle mortise, with mild lateral tilt. Diffuse soft tissue swelling is noted about the ankle. The joint spaces are preserved. No significant soft tissue abnormalities are seen. IMPRESSION: 1. Mildly displaced medial and lateral malleolar fractures, and question of a tiny avulsion fracture at the posterior malleolus. 2. Mild medial widening of the ankle mortise, with mild lateral tilt. Electronically Signed   By: Garald Balding M.D.   On: 08/16/2015 02:19   Nm Bone Scan 3 Phase  08/14/2015  CLINICAL DATA:  Trauma to left knee. History of left knee arthroplasty. EXAM: NUCLEAR MEDICINE 3-PHASE BONE SCAN TECHNIQUE: Radionuclide angiographic images, immediate static blood pool images, and 3-hour delayed static images were obtained of the knees after intravenous injection of radiopharmaceutical. RADIOPHARMACEUTICALS:  26.4 mCi Tc-74m MDP COMPARISON:  None. FINDINGS: Vascular phase: On the flow phase portion of the exam there is abnormal asymmetric increased uptake surrounding the left knee. Blood pool phase: On the blood pool phase portion of the examination there is abnormal asymmetric increased uptake surrounding the femoral component of the left knee arthroplasty device. Delayed phase: On the delayed phase images there is  asymmetric increased uptake surrounding both the tibial and femoral components of the left knee arthroplasty device. IMPRESSION: Increased uptake on all 3 phases compatible with left knee arthroplasty device loosening or infection. Electronically Signed   By: Kerby Moors M.D.   On: 08/14/2015 16:54   Dg Ankle Right Port  08/16/2015  CLINICAL DATA:  Ankle fracture, bimalleolar, closed EXAM: PORTABLE RIGHT ANKLE - 2 VIEW COMPARISON:  08/16/2015 FINDINGS: There is a screw through the medial malleolus. There is a compression plate across the lateral cortex of the distal fibular shaft and lateral malleolus. There is close approximation of medial and lateral fracture fragments. Detail is obscured by cast. IMPRESSION: ORIF bimalleolar ankle fracture Electronically Signed   By: Skipper Cliche M.D.   On: 08/16/2015 18:58   Dg C-arm 1-60 Min  08/16/2015  CLINICAL DATA:  Right ankle fracture EXAM: DG C-ARM 61-120 MIN; RIGHT ANKLE - 2 VIEW COMPARISON:  Film from earlier in the same day FLUOROSCOPY TIME:  Radiation Exposure Index (as provided by the fluoroscopic device): Not available If the device does not provide the exposure index: Fluoroscopy Time:  11 seconds Number of Acquired Images:  2 FINDINGS: Fixation sideplate is noted along the distal fibula with a single fixation screw traversing the medial malleolus. Fracture fragments are in near anatomic alignment. IMPRESSION: ORIF of right ankle fracture. Electronically Signed   By: Inez Catalina M.D.   On: 08/16/2015 18:14    Microbiology: Recent Results (from the past 240 hour(s))  Surgical pcr screen     Status: None   Collection Time: 08/16/15  1:39 PM  Result Value Ref Range Status   MRSA, PCR NEGATIVE NEGATIVE Final   Staphylococcus aureus NEGATIVE NEGATIVE Final    Comment:        The Xpert SA Assay (FDA approved for NASAL specimens in patients over 83 years of age), is one component of a comprehensive surveillance program.  Test performance  has been validated by [facility] for patients greater than or equal to 95 year old. It is not intended to diagnose infection nor to guide or monitor treatment.   Urine culture     Status: None   Collection Time: 08/17/15  5:27 PM  Result Value Ref Range Status   Specimen Description URINE, RANDOM  Final   Special Requests NONE  Final   Culture NO GROWTH 1 DAY  Final   Report Status 08/18/2015 FINAL  Final  Culture, blood (Routine X 2) w Reflex to ID Panel     Status: None (Preliminary result)   Collection Time: 08/17/15  5:55 PM  Result Value Ref Range Status   Specimen Description BLOOD LEFT ANTECUBITAL  Final   Special Requests IN PEDIATRIC BOTTLE 2CC  Final   Culture NO GROWTH < 24 HOURS  Final   Report Status PENDING  Incomplete  Culture, blood (Routine X 2) w Reflex to ID Panel     Status: None (Preliminary result)   Collection Time: 08/17/15  6:05 PM  Result Value Ref Range Status   Specimen Description BLOOD LEFT HAND  Final   Special Requests IN PEDIATRIC BOTTLE 2CC  Final   Culture NO GROWTH < 24 HOURS  Final   Report Status PENDING  Incomplete     Labs: Basic Metabolic Panel:  Recent Labs Lab 08/16/15 0654 08/17/15 0307  NA 143 139  K 4.0 3.8  CL 103 103  CO2 28 29  GLUCOSE 103* 109*  BUN 13 8  CREATININE 0.85 0.64  CALCIUM 8.6* 8.1*   Liver Function Tests:  Recent Labs Lab 08/17/15 0307  AST 20  ALT 17  ALKPHOS 102  BILITOT 1.0  PROT 5.5*  ALBUMIN 3.0*   No results for input(s): LIPASE, AMYLASE in the last 168 hours. No results for input(s): AMMONIA in the last 168 hours. CBC:  Recent Labs Lab 08/16/15 0310  WBC 8.4  NEUTROABS 5.4  HGB  12.0  HCT 36.9  MCV 97.6  PLT 179   Cardiac Enzymes: No results for input(s): CKTOTAL, CKMB, CKMBINDEX, TROPONINI in the last 168 hours. BNP: BNP (last 3 results) No results for input(s): BNP in the last 8760 hours.  ProBNP (last 3 results) No results for input(s): PROBNP in the last 8760  hours.  CBG: No results for input(s): GLUCAP in the last 168 hours.   Signed:  Velvet Bathe MD.  [facility] 08/19/2015, 12:00 PM

## 2015-08-19 NOTE — Progress Notes (Signed)
Ready for dc from ortho standpoint

## 2015-08-19 NOTE — Progress Notes (Signed)
PT Cancellation Note  Patient Details Name: [name] MRN: [id number] DOB: 1941-01-24   Cancelled Treatment:    Reason Eval/Treat Not Completed: Patient declined, no reason specified Pt declined and reported she is getting ready to d/c to SNF and would like to save her energy.    Salina April, PTA Pager: [phone]   08/19/2015, 3:06 PM

## 2015-08-19 NOTE — Clinical Social Work Note (Signed)
CSW made bed offers to patient and her son they have agreed to go to [facility] for short term rehab. Patient to be d/c'ed today to [facility].  Patient and family agreeable to plans will transport via ems RN to call report.  Evette Cristal, MSW, [place]

## 2015-08-19 NOTE — Care Management Important Message (Signed)
Important Message  Patient Details  Name: [name] MRN: [id number] Date of Birth: 1941/01/02   Medicare Important Message Given:  Yes    Barb Merino [place] 08/19/2015, 12:32 PM

## 2015-08-20 DIAGNOSIS — S8261XD Displaced fracture of lateral malleolus of right fibula, subsequent encounter for closed fracture with routine healing: Secondary | ICD-10-CM | POA: Diagnosis not present

## 2015-08-20 DIAGNOSIS — K449 Diaphragmatic hernia without obstruction or gangrene: Secondary | ICD-10-CM | POA: Diagnosis not present

## 2015-08-20 DIAGNOSIS — G9009 Other idiopathic peripheral autonomic neuropathy: Secondary | ICD-10-CM | POA: Diagnosis not present

## 2015-08-20 DIAGNOSIS — B350 Tinea barbae and tinea capitis: Secondary | ICD-10-CM | POA: Diagnosis not present

## 2015-08-22 LAB — CULTURE, BLOOD (ROUTINE X 2)
CULTURE: NO GROWTH
Culture: NO GROWTH

## 2015-08-26 ENCOUNTER — Ambulatory Visit: Payer: Medicare Other | Admitting: Cardiovascular Disease

## 2015-08-27 ENCOUNTER — Ambulatory Visit: Payer: Medicare Other | Admitting: Cardiovascular Disease

## 2015-08-28 DIAGNOSIS — M25571 Pain in right ankle and joints of right foot: Secondary | ICD-10-CM | POA: Diagnosis not present

## 2015-09-01 DIAGNOSIS — S82841D Displaced bimalleolar fracture of right lower leg, subsequent encounter for closed fracture with routine healing: Secondary | ICD-10-CM | POA: Diagnosis not present

## 2015-09-01 DIAGNOSIS — M25562 Pain in left knee: Secondary | ICD-10-CM | POA: Diagnosis not present

## 2015-09-04 ENCOUNTER — Ambulatory Visit: Payer: Medicare Other | Admitting: Family Medicine

## 2015-09-18 DIAGNOSIS — S82841D Displaced bimalleolar fracture of right lower leg, subsequent encounter for closed fracture with routine healing: Secondary | ICD-10-CM | POA: Diagnosis not present

## 2015-09-21 DIAGNOSIS — G8921 Chronic pain due to trauma: Secondary | ICD-10-CM | POA: Diagnosis not present

## 2015-09-21 DIAGNOSIS — G629 Polyneuropathy, unspecified: Secondary | ICD-10-CM | POA: Diagnosis not present

## 2015-09-21 DIAGNOSIS — S8261XE Displaced fracture of lateral malleolus of right fibula, subsequent encounter for open fracture type I or II with routine healing: Secondary | ICD-10-CM | POA: Diagnosis not present

## 2015-09-21 DIAGNOSIS — F332 Major depressive disorder, recurrent severe without psychotic features: Secondary | ICD-10-CM | POA: Diagnosis not present

## 2015-09-21 DIAGNOSIS — F418 Other specified anxiety disorders: Secondary | ICD-10-CM | POA: Diagnosis not present

## 2015-09-25 DIAGNOSIS — R238 Other skin changes: Secondary | ICD-10-CM | POA: Diagnosis not present

## 2015-10-02 DIAGNOSIS — F411 Generalized anxiety disorder: Secondary | ICD-10-CM | POA: Diagnosis not present

## 2015-10-02 DIAGNOSIS — K8681 Exocrine pancreatic insufficiency: Secondary | ICD-10-CM | POA: Diagnosis not present

## 2015-10-02 DIAGNOSIS — S8261XD Displaced fracture of lateral malleolus of right fibula, subsequent encounter for closed fracture with routine healing: Secondary | ICD-10-CM | POA: Diagnosis not present

## 2015-10-05 DIAGNOSIS — F419 Anxiety disorder, unspecified: Secondary | ICD-10-CM | POA: Diagnosis not present

## 2015-10-05 DIAGNOSIS — G2401 Drug induced subacute dyskinesia: Secondary | ICD-10-CM | POA: Diagnosis not present

## 2015-10-05 DIAGNOSIS — I10 Essential (primary) hypertension: Secondary | ICD-10-CM | POA: Diagnosis not present

## 2015-10-05 DIAGNOSIS — G8929 Other chronic pain: Secondary | ICD-10-CM | POA: Diagnosis not present

## 2015-10-05 DIAGNOSIS — S82841D Displaced bimalleolar fracture of right lower leg, subsequent encounter for closed fracture with routine healing: Secondary | ICD-10-CM | POA: Diagnosis not present

## 2015-10-05 DIAGNOSIS — M6281 Muscle weakness (generalized): Secondary | ICD-10-CM | POA: Diagnosis not present

## 2015-10-07 DIAGNOSIS — G8929 Other chronic pain: Secondary | ICD-10-CM | POA: Diagnosis not present

## 2015-10-07 DIAGNOSIS — G2401 Drug induced subacute dyskinesia: Secondary | ICD-10-CM | POA: Diagnosis not present

## 2015-10-07 DIAGNOSIS — S82841D Displaced bimalleolar fracture of right lower leg, subsequent encounter for closed fracture with routine healing: Secondary | ICD-10-CM | POA: Diagnosis not present

## 2015-10-07 DIAGNOSIS — M6281 Muscle weakness (generalized): Secondary | ICD-10-CM | POA: Diagnosis not present

## 2015-10-07 DIAGNOSIS — I10 Essential (primary) hypertension: Secondary | ICD-10-CM | POA: Diagnosis not present

## 2015-10-07 DIAGNOSIS — F419 Anxiety disorder, unspecified: Secondary | ICD-10-CM | POA: Diagnosis not present

## 2015-10-08 ENCOUNTER — Ambulatory Visit: Payer: Medicare Other | Admitting: Family Medicine

## 2015-10-12 DIAGNOSIS — M6281 Muscle weakness (generalized): Secondary | ICD-10-CM | POA: Diagnosis not present

## 2015-10-12 DIAGNOSIS — G2401 Drug induced subacute dyskinesia: Secondary | ICD-10-CM | POA: Diagnosis not present

## 2015-10-12 DIAGNOSIS — I10 Essential (primary) hypertension: Secondary | ICD-10-CM | POA: Diagnosis not present

## 2015-10-12 DIAGNOSIS — F419 Anxiety disorder, unspecified: Secondary | ICD-10-CM | POA: Diagnosis not present

## 2015-10-12 DIAGNOSIS — G8929 Other chronic pain: Secondary | ICD-10-CM | POA: Diagnosis not present

## 2015-10-12 DIAGNOSIS — S82841D Displaced bimalleolar fracture of right lower leg, subsequent encounter for closed fracture with routine healing: Secondary | ICD-10-CM | POA: Diagnosis not present

## 2015-10-14 ENCOUNTER — Ambulatory Visit
Admission: RE | Admit: 2015-10-14 | Discharge: 2015-10-14 | Disposition: A | Discharge status: Home / Self Care | Payer: Medicare Other | Source: Ambulatory Visit | Attending: Obstetrics and Gynecology | Admitting: Obstetrics and Gynecology

## 2015-10-14 ENCOUNTER — Other Ambulatory Visit: Payer: Self-pay | Admitting: Obstetrics and Gynecology

## 2015-10-14 DIAGNOSIS — R921 Mammographic calcification found on diagnostic imaging of breast: Secondary | ICD-10-CM | POA: Diagnosis not present

## 2015-10-14 DIAGNOSIS — R928 Other abnormal and inconclusive findings on diagnostic imaging of breast: Secondary | ICD-10-CM

## 2015-10-15 ENCOUNTER — Encounter: Payer: Self-pay | Admitting: Family Medicine

## 2015-10-15 ENCOUNTER — Telehealth: Payer: Self-pay | Admitting: Neurology

## 2015-10-15 ENCOUNTER — Ambulatory Visit (INDEPENDENT_AMBULATORY_CARE_PROVIDER_SITE_OTHER): Payer: Medicare Other | Admitting: Family Medicine

## 2015-10-15 VITALS — BP 110/62 | HR 76 | Temp 98.0°F | Ht 61.0 in | Wt 154.0 lb

## 2015-10-15 DIAGNOSIS — S82841S Displaced bimalleolar fracture of right lower leg, sequela: Secondary | ICD-10-CM | POA: Diagnosis not present

## 2015-10-15 DIAGNOSIS — G609 Hereditary and idiopathic neuropathy, unspecified: Secondary | ICD-10-CM

## 2015-10-15 MED ORDER — HYDROCODONE-ACETAMINOPHEN 5-325 MG PO TABS: 1.0000 | ORAL_TABLET | Freq: Four times a day (QID) | ORAL | Status: DC | PRN

## 2015-10-15 MED ORDER — HYDROCODONE-ACETAMINOPHEN 5-325 MG PO TABS: 2.0000 | ORAL_TABLET | Freq: Four times a day (QID) | ORAL | Status: DC | PRN

## 2015-10-15 NOTE — Telephone Encounter (Signed)
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

## 2015-10-15 NOTE — Telephone Encounter (Signed)
Pt's son called said she is having severe pain from neuropathy in her feet. He is requesting she be seen sooner that 11/09/15, Hoyle Sauer has an opening on 6/5/but he wants her seen sooner. Can she be scheduled with Megan? Please call

## 2015-10-15 NOTE — Assessment & Plan Note (Addendum)
Neuropathy Chronic Pain syndromeS: Patient almost 2 months out from [facility]. Had over a month stay at [facility] from march to 10/02/15. Marland Kitchen Notes show that her stay there was uneventful except primarily for pain management. She reports she has had Low grade temp since leaving hospital normal to 100 but no true fevers. Her pain in her legs- Pain worse since yesterday- taking hydrocodone after having stopped for a week. In nursing home patient had difficulty dealing with pain and fentanyl 25 mcg patch up to 50 mcg was patched every 3 days which gave her most significant relief. She was maintained on her trileptal, ropinrole, prozac, gabapentin. She also has 8 more weeks of PT and nursing care per patient.   She actually reports bilateral stabbing pain in lower legs L >R even though fracture was on right. No calf swelling.  A/P: I do not see obvious infection externally. I advised patient to follow up with orthopedics in 1 week and keep a record anytime she believes she has a fever. She asked me for a refill on hydrocodone- she has about 8 left and I provided #20 to get her to orthopedic evaluation. Discussed with patient I do not think this is truly due to her ankle fracture but instead due to her chronic neuropathic pain. She has always reported 10/10 pain to me and reports the same today. If this is not an orthopedic issue on their evaluation- would refer to pain management or wait to let her see neurology in June and get their opinion on pain management. Outside orthopedic concern- i see no potential source on exam for chronic low grade elevations in temperature.

## 2015-10-15 NOTE — Patient Instructions (Signed)
Schedule orthopedic follow up for 1 week Measure temperature daily or anytime you feel feverish- take these recordings to them next week  I do not think your pain is from orthopedic issues- I think it is from your chronic neuropathy but with the pain and the low grade temperatures- I want them to evaluate.   If no findings from orthopedic and continued pain, we will need to transition your care to pain management for chronic neuropathic pain- or you could return to neurology for their opinion on this option first.   I would be willing to write short term hydrocodone max 2 a day to help get to appointment if needed but we would need to know this is actually making a significant difference since you have always reported 10/10 pain to me in your legs

## 2015-10-15 NOTE — Progress Notes (Signed)
Subjective:  [name] LAURAIN is a 75 y.o. year old very pleasant female patient who presents for/with See problem oriented charting ROS- no measured fever, no nausea or vomiting. No blurry vision. No worsening fatigue. .see any ROS included in HPI as well.   Past Medical History-  Patient Active Problem List   Diagnosis Date Noted  . Exocrine pancreatic insufficiency ([place]) 12/30/2013    Priority: High  . Carcinoma of head of pancreas ([place]) 07/01/2013    Priority: High  . Central chest pain 07/28/2015    Priority: Medium  . HLD (hyperlipidemia) 07/28/2015    Priority: Medium  . Fatty liver 09/09/2014    Priority: Medium  . Memory loss 01/08/2014    Priority: Medium  . Other abnormal glucose 11/29/2013    Priority: Medium  . Hereditary and idiopathic peripheral neuropathy 08/30/2012    Priority: Medium  . Paresthesia of foot 06/06/2012    Priority: Medium  . Syncope 09/26/2011    Priority: Medium  . Hypothyroidism 02/26/2007    Priority: Medium  . HYPERLIPIDEMIA 02/26/2007    Priority: Medium  . ANXIETY 02/26/2007    Priority: Medium  . Adjustment disorder with mixed anxiety and depressed mood 02/26/2007    Priority: Medium  . Essential hypertension 02/26/2007    Priority: Medium  . MITRAL VALVE PROLAPSE 02/26/2007    Priority: Medium  . OSTEOPOROSIS 02/26/2007    Priority: Medium  . Lumbago 07/07/2014    Priority: Low  . Abnormality of gait 03/27/2014    Priority: Low  . RHINOSINUSITIS, CHRONIC 02/03/2009    Priority: Low  . COLONIC POLYPS 06/05/2008    Priority: Low  . OSTEOARTHRITIS 06/04/2008    Priority: Low  . DIARRHEA, CHRONIC 04/22/2008    Priority: Low  . Low back pain 08/03/2007    Priority: Low  . Chronic maxillary sinusitis 04/20/2007    Priority: Low  . TARDIVE DYSKINESIA 02/26/2007    Priority: Low  . GLAUCOMA 02/26/2007    Priority: Low  . EUSTACHIAN TUBE DYSFUNCTION 02/26/2007    Priority: Low  . ALLERGIC RHINITIS 02/26/2007    Priority:  Low  . GERD 02/26/2007    Priority: Low  . HIATAL HERNIA 02/26/2007    Priority: Low  . Abnormal radionuclide bone scan 08/16/2015  . Ankle fracture, bimalleolar, closed 08/16/2015    Medications- reviewed and updated Current Outpatient Prescriptions  Medication Sig Dispense Refill  . acetaminophen (TYLENOL) 325 MG tablet Take 650 mg by mouth every 6 (six) hours as needed for mild pain, moderate pain or fever. Reported on 07/31/2015    . aspirin 81 MG tablet Take 81 mg by mouth daily.    Marland Kitchen atenolol (TENORMIN) 25 MG tablet Take 1 tablet by mouth  every morning 90 tablet 3  . baclofen (LIORESAL) 10 MG tablet Take 1 tablet (10 mg total) by mouth 3 (three) times daily. 30 each 0  . busPIRone (BUSPAR) 15 MG tablet Take 15 mg by mouth at bedtime.    . carboxymethylcellulose (REFRESH TEARS) 0.5 % SOLN Place 1 drop into both eyes 2 (two) times daily.     . cholecalciferol (VITAMIN D) 1000 UNITS tablet Take 1,000 Units by mouth 2 (two) times daily.     . cyanocobalamin 2000 MCG tablet Take 2,000 mcg by mouth daily at 12 noon.     . docusate sodium (COLACE) 100 MG capsule Take 200 mg by mouth at bedtime.    . feeding supplement (BOOST HIGH PROTEIN) LIQD Take 1 Container  by mouth daily.    Marland Kitchen FLUoxetine (PROZAC) 20 MG capsule Take 1 capsule (20 mg total) by mouth daily. 90 capsule 2  . furosemide (LASIX) 20 MG tablet Take 1 tablet (20 mg total) by mouth daily. 90 tablet 3  . gabapentin (NEURONTIN) 400 MG capsule 2 caps 3 times daily and 3 at hs (Patient taking differently: 800 mg 3 (three) times daily. 2 caps 3 times daily and 3 at hs) 270 capsule 6  . HYDROcodone-acetaminophen (NORCO/VICODIN) 5-325 MG tablet Take 1 tablet by mouth every 6 (six) hours as needed for moderate pain. 20 tablet 0  . hydrocortisone cream 1 % Apply 1 application topically 2 (two) times daily. For rectal cyst    . levothyroxine (SYNTHROID, LEVOTHROID) 175 MCG tablet Take 1 tablet by mouth  daily before breakfast 90 tablet 2  .  lipase/protease/amylase (CREON) 12000 UNITS CPEP capsule Take 2 capsules (24,000 Units total) by mouth 3 (three) times daily before meals. 270 capsule 5  . loratadine (CLARITIN) 10 MG tablet Take 10 mg by mouth daily.    Marland Kitchen lovastatin (MEVACOR) 40 MG tablet Take 1 tablet by mouth at  bedtime 90 tablet 2  . meclizine (ANTIVERT) 25 MG tablet TAKE 1 TABLET BY MOUTH EVERY 6 HOURS AS NEEDED FOR NAUSEA OR DIZZINESS 100 tablet 1  . Multiple Vitamin (MULTIVITAMIN WITH MINERALS) TABS tablet Take 1 tablet by mouth daily.    Marland Kitchen omeprazole (PRILOSEC OTC) 20 MG tablet Take 20 mg by mouth 2 (two) times daily.     . OXcarbazepine (TRILEPTAL) 150 MG tablet Take 1 tablet (150 mg total) by mouth 2 (two) times daily. 60 tablet 0  . polycarbophil (FIBERCON) 625 MG tablet Take 625 mg by mouth daily.     . potassium chloride (K-DUR) 10 MEQ tablet Take 1 tablet (10 mEq total) by mouth daily. 90 tablet 3  . promethazine (PHENERGAN) 25 MG tablet Take 25 mg by mouth every 6 (six) hours as needed for nausea or vomiting.    Marland Kitchen rOPINIRole (REQUIP) 0.5 MG tablet Take 1 tablet (0.5 mg total) by mouth at bedtime. 30 tablet 6  . vitamin E 400 UNIT capsule Take 800 Units by mouth 2 (two) times daily. For tartive dyskinesia     No current facility-administered medications for this visit.    Objective: BP 110/62 mmHg  Pulse 76  Temp(Src) 98 F (36.7 C)  Ht 5\' 1"  (1.549 m)  Wt 154 lb (69.854 kg)  BMI 29.11 kg/m2 Gen: NAD, resting comfortably Oropharynx normal, TM normal CV: RRR no murmurs rubs or gallops Lungs: CTAB no crackles, wheeze, rhonchi Abdomen: soft/nontender/nondistended/normal bowel sounds. No rebound or guarding.  Ext: 1+ edema right- well healed scar from surgical repair of ankle fracture. Trace edema left Skin: warm, dry, no warmth swelling on right or left leg.  Neuro: grossly normal, moves all extremities  Assessment/Plan:  Ankle fracture, bimalleolar, closed Neuropathy Chronic Pain syndromeS: Patient  almost 2 months out from [facility]. Had over a month stay at [facility] home from march to 10/02/15. Marland Kitchen Notes show that her stay there was uneventful except primarily for pain management. She reports she has had Low grade temp since leaving hospital normal to 100 but no true fevers. Her pain in her legs- Pain worse since yesterday- taking hydrocodone after having stopped for a week. In nursing home patient had difficulty dealing with pain and fentanyl 25 mcg patch up to 50 mcg was patched every 3 days which gave her most significant  relief. She was maintained on her trileptal, ropinrole, prozac, gabapentin. She also has 8 more weeks of PT and nursing care per patient.   She actually reports bilateral stabbing pain in lower legs L >R even though fracture was on right. No calf swelling.  A/P: I do not see obvious infection externally. I advised patient to follow up with orthopedics in 1 week and keep a record anytime she believes she has a fever. She asked me for a refill on hydrocodone- she has about 8 left and I provided #20 to get her to orthopedic evaluation. Discussed with patient I do not think this is truly due to her ankle fracture but instead due to her chronic neuropathic pain. She has always reported 10/10 pain to me and reports the same today. If this is not an orthopedic issue on their evaluation- would refer to pain management or wait to let her see neurology in June and get their opinion on pain management. Outside orthopedic concern- i see no potential source on exam for chronic low grade elevations in temperature.   Return precautions advised. Advised I would refill hydrocodone max 2 pills a day if we get her established with transition to pain management. i do not think narcotics are best long term solution but will use temporarily in transition.   Shredded first rx- as wanted 1 pill only.  Meds ordered this encounter  Medications  . aspirin 81 MG tablet    Sig: Take 81 mg by mouth daily.   . busPIRone (BUSPAR) 15 MG tablet    Sig: Take 15 mg by mouth at bedtime.  Marland Kitchen DISCONTD: HYDROcodone-acetaminophen (NORCO/VICODIN) 5-325 MG tablet    Sig: Take 2 tablets by mouth every 6 (six) hours as needed for moderate pain.    Dispense:  20 tablet    Refill:  0  . HYDROcodone-acetaminophen (NORCO/VICODIN) 5-325 MG tablet    Sig: Take 1 tablet by mouth every 6 (six) hours as needed for moderate pain.    Dispense:  20 tablet    Refill:  0   The duration of face-to-face time during this visit was 25 minutes. Greater than 50% of this time was spent in counseling, explanation of diagnosis, planning of further management, and/or coordination of care.     [name], MD

## 2015-10-16 DIAGNOSIS — S82841D Displaced bimalleolar fracture of right lower leg, subsequent encounter for closed fracture with routine healing: Secondary | ICD-10-CM | POA: Diagnosis not present

## 2015-10-16 DIAGNOSIS — M25562 Pain in left knee: Secondary | ICD-10-CM | POA: Diagnosis not present

## 2015-10-16 DIAGNOSIS — M25462 Effusion, left knee: Secondary | ICD-10-CM | POA: Diagnosis not present

## 2015-10-19 ENCOUNTER — Ambulatory Visit (INDEPENDENT_AMBULATORY_CARE_PROVIDER_SITE_OTHER): Payer: Medicare Other | Admitting: Neurology

## 2015-10-19 ENCOUNTER — Encounter: Payer: Self-pay | Admitting: Neurology

## 2015-10-19 VITALS — BP 155/76 | HR 73 | Ht 61.0 in | Wt 154.2 lb

## 2015-10-19 DIAGNOSIS — I10 Essential (primary) hypertension: Secondary | ICD-10-CM | POA: Diagnosis not present

## 2015-10-19 DIAGNOSIS — G609 Hereditary and idiopathic neuropathy, unspecified: Secondary | ICD-10-CM | POA: Diagnosis not present

## 2015-10-19 DIAGNOSIS — S82841D Displaced bimalleolar fracture of right lower leg, subsequent encounter for closed fracture with routine healing: Secondary | ICD-10-CM | POA: Diagnosis not present

## 2015-10-19 DIAGNOSIS — F419 Anxiety disorder, unspecified: Secondary | ICD-10-CM | POA: Diagnosis not present

## 2015-10-19 DIAGNOSIS — G2401 Drug induced subacute dyskinesia: Secondary | ICD-10-CM | POA: Diagnosis not present

## 2015-10-19 DIAGNOSIS — M6281 Muscle weakness (generalized): Secondary | ICD-10-CM | POA: Diagnosis not present

## 2015-10-19 DIAGNOSIS — G8929 Other chronic pain: Secondary | ICD-10-CM | POA: Diagnosis not present

## 2015-10-19 MED ORDER — DIAZEPAM 5 MG PO TABS: 5.0000 mg | ORAL_TABLET | Freq: Four times a day (QID) | ORAL | Status: DC | PRN

## 2015-10-19 MED ORDER — HYDROCODONE-ACETAMINOPHEN 5-325 MG PO TABS: 1.0000 | ORAL_TABLET | Freq: Four times a day (QID) | ORAL | Status: DC | PRN

## 2015-10-19 MED ORDER — PREGABALIN 100 MG PO CAPS: 100.0000 mg | ORAL_CAPSULE | Freq: Three times a day (TID) | ORAL | Status: DC

## 2015-10-19 NOTE — Patient Instructions (Signed)
Taper off gabapentin first, 400 mg tablet  1//1/2 tablet for x 3 days 2 tablets every night x 3 days.  Then lyrica 100mg  three times a day

## 2015-10-19 NOTE — Progress Notes (Signed)
Chief Complaint  Patient presents with  . Peripheral Neuropathy    She is here with her son, [name], for worsening pain, burning and tingling in her lower extremites (knees into feet).  She has had two falls since last seen.  One fall caused a broken ankle that required surgery to repair.     [facility]  PATIENT: [name] DOB: November 30, 1940  HISTORY:[name], Follow-up for small fiber neuropathy. Her Primary Care physician is Dr. Yong Channel.  She had past medical history of hypertension, hyperlipidemia, depression, is on polypharmacy treatment, pancreatic cancer, had a Whipple procedure in March 2015, no chemo, radiation therapy  She presented with more than 10 years history of bilateral feet paresthesia, getting worse since 2010, she described bilateral feet numbness tingling, going up to her legs, her feet felt like it was broken sometimes, her feet burns, ice pack helps some, but sometimes she put on socks, wrapping tightly around her socks, which seems to help her symptoms some, her feet pain getting worse after bearing weight.  She also has a history of chronic low back pain, had a history of low back decompression surgery by Dr. Lorin Mercy in 2004, she denies shooting pain to her neck, no bowel bladder incontinence, mild gait difficulty due to feet pain, no significant weakness   Lboratory evaluation showed normal vitamin B12, TSH, CMP, elevated WBC 15.7 CBC, RPR, ANA, vitamin B12, TSH, mild elevated C. reactive protein of 11.2,   Treatment: She has tried compounding cream, which has made her skin peel, she is taking gabapentin 300 mg 3 times a day, which helped her 60%, also make her tired, the most bothersome symptoms at nighttime, she complains of bilateral plantar feet burning pain, she has to wrap her toes together, soak it in hot water, which only helped her temporarily.She was taken off the Topamax by her psychiatrist and recently Cymbalta by her cardiologist due  to orthostatic hypotension.   EMG nerve conduction study in June 2014, showed no large fiber peripheral neuropathy, no evidence of lumbar radiculopathy  MRI lumbar in November 2015: multilevel degenerative disc disease, moderate bilateral foraminal stenosis at L 3-4,  posterior decompression and interbody fusion L4, L5, S1  Skin biopsy was abnormal significantly decreased nerve fiber density at the right thigh, right foot, consistent with small fiber neuropathy, epidermal nerve fiber density was normal in the right calf, right foot showed normal sweat gland nerve fiber density.  UPDATE May 22nd 2017: She is on buspirone 15mg  qday, also taking Trileptal 150 mg twice a day, gabapentin 400 mg 2/2/3 tablets, continue complains of significant bilateral top feet lower extremity pain, left knee pain, she also suffered right ankle fracture require surgical fixation, had rehabilitation, no ambulate with a walker,   REVIEW OF SYSTEMS: Full 14 system review of systems performed and notable only for those listed, all others are neg:  Fever, excessive sweaty, hearing loss, ringing ears, runny nose, chest pain, leg swelling, abdominal pain, nausea, frequent awakening, daytime sleepiness, frequent urination, urgency, headaches, numbness, weakness, nervousness   ALLERGIES: Allergies  Allergen Reactions  . Other Other (See Comments)    According to patient Trilafor and Reglan cause Tardive Dyskinesia  . Metoclopramide Hcl Other (See Comments)    Shaking; caused tardive dyskinesia  . Niacin Other (See Comments)    shaking  . Trovan [Alatrofloxacin Mesylate] Other (See Comments)    Caused shaking and nervousness  . Nortriptyline Other (See Comments)    Dizziness   . Benzocaine-Resorcinol Rash  .  Celecoxib Other (See Comments)    unknown  . Glucosamine Other (See Comments)    unknown  . Phenazopyridine Hcl Other (See Comments)    unknown  . Sulfa Antibiotics Nausea And Vomiting and Rash  .  Sulfonamide Derivatives Nausea And Vomiting and Rash    HOME MEDICATIONS: Outpatient Prescriptions Prior to Visit  Medication Sig Dispense Refill  . acetaminophen (TYLENOL) 325 MG tablet Take 650 mg by mouth every 6 (six) hours as needed for mild pain, moderate pain or fever. Reported on 07/31/2015    . aspirin 81 MG tablet Take 81 mg by mouth daily.    Marland Kitchen atenolol (TENORMIN) 25 MG tablet Take 1 tablet by mouth  every morning 90 tablet 3  . baclofen (LIORESAL) 10 MG tablet Take 1 tablet (10 mg total) by mouth 3 (three) times daily. 30 each 0  . busPIRone (BUSPAR) 15 MG tablet Take 15 mg by mouth at bedtime.    . carboxymethylcellulose (REFRESH TEARS) 0.5 % SOLN Place 1 drop into both eyes 2 (two) times daily.     . cholecalciferol (VITAMIN D) 1000 UNITS tablet Take 1,000 Units by mouth 2 (two) times daily.     . cyanocobalamin 2000 MCG tablet Take 2,000 mcg by mouth daily at 12 noon.     . docusate sodium (COLACE) 100 MG capsule Take 200 mg by mouth at bedtime.    . feeding supplement (BOOST HIGH PROTEIN) LIQD Take 1 Container by mouth daily.    Marland Kitchen FLUoxetine (PROZAC) 20 MG capsule Take 1 capsule (20 mg total) by mouth daily. 90 capsule 2  . furosemide (LASIX) 20 MG tablet Take 1 tablet (20 mg total) by mouth daily. 90 tablet 3  . gabapentin (NEURONTIN) 400 MG capsule 2 caps 3 times daily and 3 at hs (Patient taking differently: 800 mg 3 (three) times daily. 2 caps 3 times daily and 3 at hs) 270 capsule 6  . HYDROcodone-acetaminophen (NORCO/VICODIN) 5-325 MG tablet Take 1 tablet by mouth every 6 (six) hours as needed for moderate pain. 20 tablet 0  . hydrocortisone cream 1 % Apply 1 application topically 2 (two) times daily. For rectal cyst    . levothyroxine (SYNTHROID, LEVOTHROID) 175 MCG tablet Take 1 tablet by mouth  daily before breakfast 90 tablet 2  . lipase/protease/amylase (CREON) 12000 UNITS CPEP capsule Take 2 capsules (24,000 Units total) by mouth 3 (three) times daily before meals. 270  capsule 5  . loratadine (CLARITIN) 10 MG tablet Take 10 mg by mouth daily.    Marland Kitchen lovastatin (MEVACOR) 40 MG tablet Take 1 tablet by mouth at  bedtime 90 tablet 2  . meclizine (ANTIVERT) 25 MG tablet TAKE 1 TABLET BY MOUTH EVERY 6 HOURS AS NEEDED FOR NAUSEA OR DIZZINESS 100 tablet 1  . Multiple Vitamin (MULTIVITAMIN WITH MINERALS) TABS tablet Take 1 tablet by mouth daily.    Marland Kitchen omeprazole (PRILOSEC OTC) 20 MG tablet Take 20 mg by mouth 2 (two) times daily.     . OXcarbazepine (TRILEPTAL) 150 MG tablet Take 1 tablet (150 mg total) by mouth 2 (two) times daily. 60 tablet 0  . polycarbophil (FIBERCON) 625 MG tablet Take 625 mg by mouth daily.     . potassium chloride (K-DUR) 10 MEQ tablet Take 1 tablet (10 mEq total) by mouth daily. 90 tablet 3  . promethazine (PHENERGAN) 25 MG tablet Take 25 mg by mouth every 6 (six) hours as needed for nausea or vomiting.    Marland Kitchen rOPINIRole (REQUIP) 0.5  MG tablet Take 1 tablet (0.5 mg total) by mouth at bedtime. 30 tablet 6  . vitamin E 400 UNIT capsule Take 800 Units by mouth 2 (two) times daily. For tartive dyskinesia     No facility-administered medications prior to visit.    PAST MEDICAL HISTORY: Past Medical History  Diagnosis Date  . Osteoarthritis   . Hiatal hernia   . Chronic maxillary sinusitis     neti pot  . Depression     alone a lot  . Eustachian tube dysfunction   . Tardive dyskinesia     possibly reglan, vitamin E helps  . Mitral valve prolapse     antibiotics before dental procedures  . GERD (gastroesophageal reflux disease)   . Allergic rhinitis   . Glaucoma   . Hypothyroid   . Hypertension   . Heart murmur     hx. "MVP" -predental antibiotics  . Memory loss     short term memory loss  . Urgency of urination     some UTI in past  . Stroke [facility])     mini storkes left leg paraylsis. patient denies weakness 01/08/14.   . Cancer ([place]) 07/10/13    Pancreatic cancer with MRI scan 06-19-13    PAST SURGICAL HISTORY: Past Surgical  History  Procedure Laterality Date  . Knee surgery Left     x 5, total knee Left knee  . 1 baker cyst removed    . Dilation and curettage of uterus      x3  . Lumbar spine surgery      x2  . Abdominal hysterectomy      including ovaries  . Lumbar spine surgery      cyst  . Joint replacement      LTKA  . Blepharoplasty Bilateral     with cataract surgery  . Eye surgery Right     cataract  . Eus N/A 07/10/2013    Procedure: ESOPHAGEAL ENDOSCOPIC ULTRASOUND (EUS) RADIAL;  Surgeon: Arta Silence, MD;  Location: [facility];  Service: Endoscopy;  Laterality: N/A;  . Fine needle aspiration N/A 07/10/2013    Procedure: FINE NEEDLE ASPIRATION (FNA) LINEAR;  Surgeon: Arta Silence, MD;  Location: [facility];  Service: Endoscopy;  Laterality: N/A;  possible fna  . Back surgery      fusion  . Breast surgery      Biopsy left 2 times  . Colonoscopy w/ polypectomy      2004 last colonoscopy, no polyps  . Laparoscopy N/A 08/07/2013    Procedure: LAPAROSCOPY DIAGNOSTIC;  Surgeon: [name], MD;  Location: [place];  Service: General;  Laterality: N/A;  . Whipple procedure N/A 08/07/2013    Procedure: WHIPPLE PROCEDURE;  Surgeon: [name], MD;  Location: [place];  Service: General;  Laterality: N/A;  . Esophagogastroduodenoscopy N/A 09/11/2013    Procedure: ESOPHAGOGASTRODUODENOSCOPY (EGD);  Surgeon: Cleotis Nipper, MD;  Location: [facility] ENDOSCOPY;  Service: Endoscopy;  Laterality: N/A;  Moderate sedation okay if MAC not available  . Orif ankle fracture Right 08/16/2015    Procedure: OPEN REDUCTION INTERNAL FIXATION (ORIF) ANKLE FRACTURE;  Surgeon: Meredith Pel, MD;  Location: [place];  Service: Orthopedics;  Laterality: Right;    FAMILY HISTORY: Family History  Problem Relation Age of Onset  . Cancer Mother     Breast Cancer with Metastatic disease  . Alzheimer's disease Father   . Other Brother 17    GSW    SOCIAL HISTORY: Social History   Social History  .  Marital Status:  Widowed    Spouse Name: N/A  . Number of Children: 2  . Years of Education: 8   Occupational History  .    Marland Kitchen Retired    Social History Main Topics  . Smoking status: Never Smoker   . Smokeless tobacco: Never Used  . Alcohol Use: No  . Drug Use: No  . Sexual Activity: Not Currently   Other Topics Concern  . 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 Generalized: Well developed, in no acute distress  Head: normocephalic and atraumatic,. Oropharynx benign  Neck: Supple, no carotid bruits ,  Neurological examination  Mentation: Alert oriented to time, place, history taking. Follows all commands speech and language fluent.  Cranial nerve II-XII: .Pupils were equal round reactive to light extraocular movements were full, visual field were full on confrontational test. Facial sensation and strength were normal. hearing was intact to finger rubbing bilaterally. Uvula tongue midline. head turning and shoulder shrug were normal and symmetric.Tongue protrusion into cheek strength was normal.  Motor: normal bulk and tone, full strength in the BUE, BLE, No focal weakness, tenderness of bilateral feet upon deep palpation  Sensory: Length dependent decreased fine touch, pinprick to mid shin level bilaterally, decreased vibratory to toes, preserved proprioception  Coordination:  finger-nose-finger, heel-to-shin bilaterally, no dysmetria  Reflexes: Brachioradialis 2/2, biceps 2/2, triceps 2/2, patellar 2/2, Achilles trace, plantar responses were flexor bilaterally.  Gait and Station: Need assistant to get up from seated position, cautious, wide base, rely on her walker   DIAGNOSTIC DATA (LABS, IMAGING, TESTING) - I reviewed patient records, labs, notes, testing and imaging myself where available.  Lab Results  Component Value Date   WBC 8.4 08/16/2015   HGB 12.0 08/16/2015   HCT 36.9 08/16/2015   MCV 97.6 08/16/2015   PLT 179 08/16/2015      Component Value Date/Time   NA 139 08/17/2015 0307   NA 144 01/23/2015   K 3.8 08/17/2015 0307   CL 103 08/17/2015 0307   CO2 29 08/17/2015 0307   GLUCOSE 109* 08/17/2015 0307   BUN 8 08/17/2015 0307   BUN 13 01/23/2015   CREATININE 0.64 08/17/2015 0307   CREATININE 0.7 01/23/2015   CREATININE 0.81 07/01/2013 1324   CALCIUM 8.1* 08/17/2015 0307   PROT 5.5* 08/17/2015 0307   PROT 6.5 07/07/2014 1246   ALBUMIN 3.0* 08/17/2015 0307   AST 20 08/17/2015 0307   ALT 17 08/17/2015 0307   ALKPHOS 102 08/17/2015 0307   BILITOT 1.0 08/17/2015 0307   GFRNONAA >60 08/17/2015 0307   GFRAA >60 08/17/2015 0307    Lab Results  Component Value Date   HGBA1C 5.2 07/07/2014  ASSESSMENT AND PLAN 75 y.o. year old female Small fiber neuropathy  EMG nerve conduction study was normal no evidence of large fiber peripheral neuropathy  Skin biopsy consistent with small fiber neuropathy  She is on gabapentin with limited control of her symptoms, also taking Trileptal 150 mg mg twice a day, Requip, previously tried Cymbalta, orthostatic hypotension was discontinued by her cardiologist, Topamax, Worsening mood disorder, was discontinued by her psychologist, compounding cream, complains of peeling of her skin  Will try Lyrica today, 100 mg 3 times a day,  Hydrocodone 5/325 mg as needed, which does helped her feet pain. May  also consider long-term low-dose fentanyl patch   Marcial Pacas, M.D. Ph.D.  [facility] [place], [place] Phone: [phone] Fax:      [phone]

## 2015-10-21 DIAGNOSIS — I10 Essential (primary) hypertension: Secondary | ICD-10-CM | POA: Diagnosis not present

## 2015-10-21 DIAGNOSIS — G8929 Other chronic pain: Secondary | ICD-10-CM | POA: Diagnosis not present

## 2015-10-21 DIAGNOSIS — G2401 Drug induced subacute dyskinesia: Secondary | ICD-10-CM | POA: Diagnosis not present

## 2015-10-21 DIAGNOSIS — F419 Anxiety disorder, unspecified: Secondary | ICD-10-CM | POA: Diagnosis not present

## 2015-10-21 DIAGNOSIS — S82841D Displaced bimalleolar fracture of right lower leg, subsequent encounter for closed fracture with routine healing: Secondary | ICD-10-CM | POA: Diagnosis not present

## 2015-10-21 DIAGNOSIS — M6281 Muscle weakness (generalized): Secondary | ICD-10-CM | POA: Diagnosis not present

## 2015-10-22 ENCOUNTER — Emergency Department (HOSPITAL_COMMUNITY)
Admission: EM | Admit: 2015-10-22 | Discharge: 2015-10-22 | Disposition: A | Discharge status: Home / Self Care | Payer: Medicare Other | Attending: Emergency Medicine | Admitting: Emergency Medicine

## 2015-10-22 ENCOUNTER — Emergency Department (HOSPITAL_COMMUNITY): Payer: Medicare Other

## 2015-10-22 ENCOUNTER — Encounter (HOSPITAL_COMMUNITY): Payer: Self-pay | Admitting: Emergency Medicine

## 2015-10-22 DIAGNOSIS — M25562 Pain in left knee: Secondary | ICD-10-CM | POA: Diagnosis not present

## 2015-10-22 DIAGNOSIS — I10 Essential (primary) hypertension: Secondary | ICD-10-CM | POA: Insufficient documentation

## 2015-10-22 DIAGNOSIS — Z79899 Other long term (current) drug therapy: Secondary | ICD-10-CM | POA: Insufficient documentation

## 2015-10-22 DIAGNOSIS — Z7982 Long term (current) use of aspirin: Secondary | ICD-10-CM | POA: Diagnosis not present

## 2015-10-22 DIAGNOSIS — Z8673 Personal history of transient ischemic attack (TIA), and cerebral infarction without residual deficits: Secondary | ICD-10-CM | POA: Insufficient documentation

## 2015-10-22 DIAGNOSIS — Z96652 Presence of left artificial knee joint: Secondary | ICD-10-CM | POA: Diagnosis not present

## 2015-10-22 DIAGNOSIS — M25462 Effusion, left knee: Secondary | ICD-10-CM | POA: Insufficient documentation

## 2015-10-22 DIAGNOSIS — Z8507 Personal history of malignant neoplasm of pancreas: Secondary | ICD-10-CM | POA: Insufficient documentation

## 2015-10-22 DIAGNOSIS — R52 Pain, unspecified: Secondary | ICD-10-CM | POA: Diagnosis not present

## 2015-10-22 LAB — CBC WITH DIFFERENTIAL/PLATELET
BASOS PCT: 0 %
Basophils Absolute: 0 10*3/uL (ref 0.0–0.1)
EOS ABS: 0.1 10*3/uL (ref 0.0–0.7)
EOS PCT: 1 %
HCT: 39.1 % (ref 36.0–46.0)
HEMOGLOBIN: 12.8 g/dL (ref 12.0–15.0)
Lymphocytes Relative: 13 %
Lymphs Abs: 1.1 10*3/uL (ref 0.7–4.0)
MCH: 31.1 pg (ref 26.0–34.0)
MCHC: 32.7 g/dL (ref 30.0–36.0)
MCV: 94.9 fL (ref 78.0–100.0)
Monocytes Absolute: 0.4 10*3/uL (ref 0.1–1.0)
Monocytes Relative: 4 %
NEUTROS PCT: 82 %
Neutro Abs: 7.1 10*3/uL (ref 1.7–7.7)
PLATELETS: 173 10*3/uL (ref 150–400)
RBC: 4.12 MIL/uL (ref 3.87–5.11)
RDW: 15.4 % (ref 11.5–15.5)
WBC: 8.7 10*3/uL (ref 4.0–10.5)

## 2015-10-22 LAB — I-STAT CHEM 8, ED
BUN: 14 mg/dL (ref 6–20)
CALCIUM ION: 1.1 mmol/L — AB (ref 1.13–1.30)
CHLORIDE: 100 mmol/L — AB (ref 101–111)
Creatinine, Ser: 0.8 mg/dL (ref 0.44–1.00)
Glucose, Bld: 121 mg/dL — ABNORMAL HIGH (ref 65–99)
HEMATOCRIT: 41 % (ref 36.0–46.0)
Hemoglobin: 13.9 g/dL (ref 12.0–15.0)
Potassium: 3.5 mmol/L (ref 3.5–5.1)
SODIUM: 140 mmol/L (ref 135–145)
TCO2: 28 mmol/L (ref 0–100)

## 2015-10-22 MED ORDER — OXYCODONE-ACETAMINOPHEN 5-325 MG PO TABS: 1.0000 | ORAL_TABLET | Freq: Four times a day (QID) | ORAL | Status: DC | PRN

## 2015-10-22 MED ORDER — FENTANYL CITRATE (PF) 100 MCG/2ML IJ SOLN
50.0000 ug | Freq: Once | INTRAMUSCULAR | Status: AC
  Administered 2015-10-22: 50 ug via INTRAMUSCULAR
  Filled 2015-10-22: qty 2

## 2015-10-22 MED ORDER — HYDROMORPHONE HCL 1 MG/ML IJ SOLN
1.0000 mg | Freq: Once | INTRAMUSCULAR | Status: AC
  Administered 2015-10-22: 1 mg via INTRAVENOUS
  Filled 2015-10-22: qty 1

## 2015-10-22 MED ORDER — ONDANSETRON 4 MG PO TBDP
4.0000 mg | ORAL_TABLET | Freq: Once | ORAL | Status: AC
  Administered 2015-10-22: 4 mg via ORAL
  Filled 2015-10-22: qty 1

## 2015-10-22 NOTE — ED Notes (Signed)
Bed: [facility] Expected date:  Expected time:  Means of arrival:  Comments: EMS 75 yo female knee pain

## 2015-10-22 NOTE — Discharge Instructions (Signed)
Knee Effusion Knee effusion means that you have extra fluid in your knee. This can cause pain. Your knee may be more difficult to bend and move. HOME CARE  Use crutches as told by your doctor.  Wear a knee brace as told by your doctor.  Apply ice to the swollen area:  Put ice in a plastic bag.  Place a towel between your skin and the bag.  Leave the ice on for 20 minutes, 2-3 times per day.  Keep your knee raised (elevated) when you are sitting or lying down.  Take medicines only as told by your doctor.  Do any rehabilitation or strengthening exercises as told by your doctor.  Rest your knee as told by your doctor. You may start doing your normal activities again when your doctor says it is okay.  Keep all follow-up visits as told by your doctor. This is important. GET HELP IF:   You continue to have pain in your knee. GET HELP RIGHT AWAY IF:  You have increased swelling or redness of your knee.  You have severe pain in your knee.  You have a fever.   This information is not intended to replace advice given to you by your health care provider. Make sure you discuss any questions you have with your health care provider.   Document Released: 06/18/2010 Document Revised: 06/06/2014 Document Reviewed: 12/30/2013 Elsevier Interactive Patient Education 2016 [place] therapy can help ease sore, stiff, injured, and tight muscles and joints. Heat relaxes your muscles, which may help ease your pain. Heat therapy should only be used on old, pre-existing, or long-lasting (chronic) injuries. Do not use heat therapy unless told by your doctor. HOW TO USE HEAT THERAPY There are several different kinds of heat therapy, including:  Moist heat pack.  Warm water bath.  Hot water bottle.  Electric heating pad.  Heated gel pack.  Heated wrap.  Electric heating pad. GENERAL HEAT THERAPY RECOMMENDATIONS   Do not sleep while using heat therapy. Only use  heat therapy while you are awake.  Your skin may turn pink while using heat therapy. Do not use heat therapy if your skin turns red.  Do not use heat therapy if you have new pain.  High heat or long exposure to heat can cause burns. Be careful when using heat therapy to avoid burning your skin.  Do not use heat therapy on areas of your skin that are already irritated, such as with a rash or sunburn. GET HELP IF:   You have blisters, redness, swelling (puffiness), or numbness.  You have new pain.  Your pain is worse. MAKE SURE YOU:  Understand these instructions.  Will watch your condition.  Will get help right away if you are not doing well or get worse.   This information is not intended to replace advice given to you by your health care provider. Make sure you discuss any questions you have with your health care provider.   Document Released: 08/08/2011 Document Revised: 06/06/2014 Document Reviewed: 07/09/2013 Elsevier Interactive Patient Education [employer]. As discussed.  I will spoke with Dr. Marlou Sa, who would like you to be placed in a knee immobilizer for support.  Please call the office to make an appointment for further evaluation.  You've also been given a prescription for stronger pain medication called Percocet.  He can take 1-2 tablets every 4-6 hours as needed for severe pain.  I would like you to take the pain  medication on a regular basis so that the pain does not become out of control or unmanageable as it was this morning

## 2015-10-22 NOTE — ED Notes (Signed)
[facility] presents with a 75 yo female from home with left knee pain for two weeks.  Pt had a left knee replacement ten years ago and recently went to PCP for left knee pain, got x-rays and was told that she had a rod in the left knee that had shifted/displaced.  Pt had an appointment to see Dr. Dean/Dr. Lorin Mercy around the 2nd of June for further evaluation.  Pt experienced greater pain tonight around 8:30 pm and took hydrocodone without relief.  Pt took more hydrocodone around 11:30 pm without relief of pain.  VS stable. Pt is experiencing 10/10 pain at this time.

## 2015-10-22 NOTE — ED Provider Notes (Signed)
CSN: [id number]     Arrival date & time 10/22/15  0144 History   First MD Initiated Contact with Patient 10/22/15 0232     Chief Complaint  Patient presents with  . Knee Pain     (Consider location/radiation/quality/duration/timing/severity/associated sxs/prior Treatment) HPI Comments: This 75 year old female status post knee replacement 10 years ago who for the past 3 weeks has been having increased pain to the knee.  She's been seen by her orthopedist.  He was tapped her fusion 2.  She is to see Dr. Marlou Sa or Lorin Mercy.  On June 2 for further evaluation.  Tonight despite the use of Vicodin 2.  Pain is excruciating.  It woke her from sleep.  She is unable to ambulate or get comfortable.  Denies any new trauma or injury to the knee  Patient is a 75 y.o. female presenting with knee pain. The history is provided by the patient.  Knee Pain Location:  Knee Injury: no   Knee location:  L knee Pain details:    Quality:  Aching   Radiates to:  Does not radiate   Severity:  Moderate   Onset quality:  Sudden   Timing:  Constant   Progression:  Unchanged Chronicity:  Recurrent Dislocation: no   Prior injury to area:  No Associated symptoms: no fever     Past Medical History  Diagnosis Date  . Osteoarthritis   . Hiatal hernia   . Chronic maxillary sinusitis     neti pot  . Depression     alone a lot  . Eustachian tube dysfunction   . Tardive dyskinesia     possibly reglan, vitamin E helps  . Mitral valve prolapse     antibiotics before dental procedures  . GERD (gastroesophageal reflux disease)   . Allergic rhinitis   . Glaucoma   . Hypothyroid   . Hypertension   . Heart murmur     hx. "MVP" -predental antibiotics  . Memory loss     short term memory loss  . Urgency of urination     some UTI in past  . Stroke [facility])     mini storkes left leg paraylsis. patient denies weakness 01/08/14.   . Cancer ([place]) 07/10/13    Pancreatic cancer with MRI scan 06-19-13   Past Surgical  History  Procedure Laterality Date  . Knee surgery Left     x 5, total knee Left knee  . 1 baker cyst removed    . Dilation and curettage of uterus      x3  . Lumbar spine surgery      x2  . Abdominal hysterectomy      including ovaries  . Lumbar spine surgery      cyst  . Joint replacement      LTKA  . Blepharoplasty Bilateral     with cataract surgery  . Eye surgery Right     cataract  . Eus N/A 07/10/2013    Procedure: ESOPHAGEAL ENDOSCOPIC ULTRASOUND (EUS) RADIAL;  Surgeon: Arta Silence, MD;  Location: [facility];  Service: Endoscopy;  Laterality: N/A;  . Fine needle aspiration N/A 07/10/2013    Procedure: FINE NEEDLE ASPIRATION (FNA) LINEAR;  Surgeon: Arta Silence, MD;  Location: [facility];  Service: Endoscopy;  Laterality: N/A;  possible fna  . Back surgery      fusion  . Breast surgery      Biopsy left 2 times  . Colonoscopy w/ polypectomy      2004 last colonoscopy,  no polyps  . Laparoscopy N/A 08/07/2013    Procedure: LAPAROSCOPY DIAGNOSTIC;  Surgeon: [name], MD;  Location: [place];  Service: General;  Laterality: N/A;  . Whipple procedure N/A 08/07/2013    Procedure: WHIPPLE PROCEDURE;  Surgeon: [name], MD;  Location: [place];  Service: General;  Laterality: N/A;  . Esophagogastroduodenoscopy N/A 09/11/2013    Procedure: ESOPHAGOGASTRODUODENOSCOPY (EGD);  Surgeon: Cleotis Nipper, MD;  Location: [facility] ENDOSCOPY;  Service: Endoscopy;  Laterality: N/A;  Moderate sedation okay if MAC not available  . Orif ankle fracture Right 08/16/2015    Procedure: OPEN REDUCTION INTERNAL FIXATION (ORIF) ANKLE FRACTURE;  Surgeon: Meredith Pel, MD;  Location: [place];  Service: Orthopedics;  Laterality: Right;   Family History  Problem Relation Age of Onset  . Cancer Mother     Breast Cancer with Metastatic disease  . Alzheimer's disease Father   . Other Brother 55    GSW   Social History  Substance Use Topics  . Smoking status: Never Smoker   . Smokeless tobacco:  Never Used  . Alcohol Use: No   OB History    No data available     Review of Systems  Constitutional: Negative for fever and chills.  Musculoskeletal: Positive for joint swelling and arthralgias.  Skin: Negative for rash and wound.  Neurological: Negative for weakness and numbness.  All other systems reviewed and are negative.     Allergies  Other; Metoclopramide hcl; Niacin; Trovan; Nortriptyline; Benzocaine-resorcinol; Celecoxib; Glucosamine; Phenazopyridine hcl; Sulfa antibiotics; and Sulfonamide derivatives  Home Medications   Prior to Admission medications   Medication Sig Start Date End Date Taking? Authorizing Provider  acetaminophen (TYLENOL) 325 MG tablet Take 650 mg by mouth every 6 (six) hours as needed for mild pain, moderate pain or fever. Reported on 07/31/2015    Historical Provider, MD  aspirin 81 MG tablet Take 81 mg by mouth daily.    Historical Provider, MD  atenolol (TENORMIN) 25 MG tablet Take 1 tablet by mouth  every morning 02/09/15   Marin Olp, MD  baclofen (LIORESAL) 10 MG tablet Take 1 tablet (10 mg total) by mouth 3 (three) times daily. 08/19/15   Velvet Bathe, MD  busPIRone (BUSPAR) 15 MG tablet Take 15 mg by mouth at bedtime.    Historical Provider, MD  carboxymethylcellulose (REFRESH TEARS) 0.5 % SOLN Place 1 drop into both eyes 2 (two) times daily.     Historical Provider, MD  cholecalciferol (VITAMIN D) 1000 UNITS tablet Take 1,000 Units by mouth 2 (two) times daily.     Historical Provider, MD  cyanocobalamin 2000 MCG tablet Take 2,000 mcg by mouth daily at 12 noon.     Historical Provider, MD  diazepam (VALIUM) 5 MG tablet Take 1 tablet (5 mg total) by mouth every 6 (six) hours as needed for anxiety. 10/19/15   Marcial Pacas, MD  docusate sodium (COLACE) 100 MG capsule Take 200 mg by mouth at bedtime.    Historical Provider, MD  feeding supplement (BOOST HIGH PROTEIN) LIQD Take 1 Container by mouth daily.    Historical Provider, MD  FLUoxetine  (PROZAC) 20 MG capsule Take 1 capsule (20 mg total) by mouth daily. 11/17/14   Marin Olp, MD  furosemide (LASIX) 20 MG tablet Take 1 tablet (20 mg total) by mouth daily. 08/06/15   Isaiah Serge, NP  gabapentin (NEURONTIN) 400 MG capsule 2 caps 3 times daily and 3 at hs Patient taking differently: 800 mg 3 (  three) times daily. 2 caps 3 times daily and 3 at hs 05/11/15   Dennie Bible, NP  HYDROcodone-acetaminophen (NORCO/VICODIN) 5-325 MG tablet Take 1 tablet by mouth every 6 (six) hours as needed for moderate pain. 10/19/15   Marcial Pacas, MD  hydrocortisone cream 1 % Apply 1 application topically 2 (two) times daily. For rectal cyst    Historical Provider, MD  levothyroxine (SYNTHROID, LEVOTHROID) 175 MCG tablet Take 1 tablet by mouth  daily before breakfast 02/09/15   Marin Olp, MD  lipase/protease/amylase (CREON) 12000 UNITS CPEP capsule Take 2 capsules (24,000 Units total) by mouth 3 (three) times daily before meals. 05/25/14   Michael Boston, MD  loratadine (CLARITIN) 10 MG tablet Take 10 mg by mouth daily.    Historical Provider, MD  lovastatin (MEVACOR) 40 MG tablet Take 1 tablet by mouth at  bedtime 02/09/15   Marin Olp, MD  meclizine (ANTIVERT) 25 MG tablet TAKE 1 TABLET BY MOUTH EVERY 6 HOURS AS NEEDED FOR NAUSEA OR DIZZINESS 07/31/15   Marin Olp, MD  Multiple Vitamin (MULTIVITAMIN WITH MINERALS) TABS tablet Take 1 tablet by mouth daily.    Historical Provider, MD  omeprazole (PRILOSEC OTC) 20 MG tablet Take 20 mg by mouth 2 (two) times daily.     Historical Provider, MD  OXcarbazepine (TRILEPTAL) 150 MG tablet Take 1 tablet (150 mg total) by mouth 2 (two) times daily. 08/19/15   Velvet Bathe, MD  oxyCODONE-acetaminophen (PERCOCET/ROXICET) 5-325 MG tablet Take 1-2 tablets by mouth every 6 (six) hours as needed for severe pain. 10/22/15   Junius Creamer, NP  polycarbophil (FIBERCON) 625 MG tablet Take 625 mg by mouth daily.     Historical Provider, MD  potassium chloride  (K-DUR) 10 MEQ tablet Take 1 tablet (10 mEq total) by mouth daily. 08/06/15   Isaiah Serge, NP  pregabalin (LYRICA) 100 MG capsule Take 1 capsule (100 mg total) by mouth 3 (three) times daily. 10/19/15   Marcial Pacas, MD  promethazine (PHENERGAN) 25 MG tablet Take 25 mg by mouth every 6 (six) hours as needed for nausea or vomiting.    Historical Provider, MD  rOPINIRole (REQUIP) 0.5 MG tablet Take 1 tablet (0.5 mg total) by mouth at bedtime. 05/11/15   Dennie Bible, NP  vitamin E 400 UNIT capsule Take 800 Units by mouth 2 (two) times daily. For tartive dyskinesia    Historical Provider, MD   BP 133/60 mmHg  Pulse 64  Temp(Src) 98.9 F (37.2 C) (Oral)  Resp 17  Ht 5\' 1"  (1.549 m)  Wt 69.854 kg  BMI 29.11 kg/m2  SpO2 90% Physical Exam  Constitutional: She is oriented to person, place, and time. She appears well-developed and well-nourished. She appears distressed.  HENT:  Head: Normocephalic.  Neck: Normal range of motion.  Cardiovascular: Normal rate and regular rhythm.   Pulmonary/Chest: Effort normal and breath sounds normal.  Musculoskeletal: She exhibits tenderness. She exhibits no edema.       Left knee: She exhibits decreased range of motion and effusion.       Legs: Neurological: She is alert and oriented to person, place, and time.  Skin: Skin is warm.  Nursing note reviewed.   ED Course  Procedures (including critical care time) Labs Review Labs Reviewed  I-STAT CHEM 8, ED - Abnormal; Notable for the following:    Chloride 100 (*)    Glucose, Bld 121 (*)    Calcium, Ion 1.10 (*)    All  other components within normal limits  CBC WITH DIFFERENTIAL/PLATELET    Imaging Review Dg Knee Complete 4 Views Left  10/22/2015  CLINICAL DATA:  Status post knee replacement, with chronic left knee pain and swelling. Initial encounter. EXAM: LEFT KNEE - COMPLETE 4+ VIEW COMPARISON:  Left knee radiograph performed 10/16/2015 FINDINGS: There is no evidence of fracture or  dislocation. The patient's total knee arthroplasty is grossly unremarkable in appearance. No significant degenerative change is seen. A very large knee joint effusion is noted.  A fabella is seen. IMPRESSION: 1. No evidence of fracture or dislocation. Total knee arthroplasty is grossly unremarkable in appearance. 2. Very large knee joint effusion noted. Electronically Signed   By: Garald Balding M.D.   On: 10/22/2015 03:06   I have personally reviewed and evaluated these images and lab results as part of my medical decision-making.   EKG Interpretation None     X-ray shows that she has a large effusion.  Patient was given 50 mics of fentanyl IM with little relief of her discomfort I was a nurse to start IV give the patient IV Dilaudid after which she has significant decrease in her pain.  I did speak with Dr. Marlou Sa her orthopedic surgeon.  He would like her placed in a knee immobilizer with office follow-up.  He does not want [place] to drain her effusion.  He states that it's been drained 2 with no sign of infection MDM   Final diagnoses:  Knee effusion, left        Junius Creamer, NP 10/22/15 [id number]  Varney Biles, MD 10/22/15 2304

## 2015-10-23 ENCOUNTER — Telehealth: Payer: Self-pay | Admitting: Family Medicine

## 2015-10-23 MED ORDER — ONDANSETRON 4 MG PO TBDP: 4.0000 mg | ORAL_TABLET | Freq: Three times a day (TID) | ORAL | Status: DC | PRN

## 2015-10-23 NOTE — Telephone Encounter (Signed)
See below

## 2015-10-23 NOTE — Telephone Encounter (Signed)
Sent some zofran in but the better choice would be to take less of the oxycodone if possible

## 2015-10-23 NOTE — Telephone Encounter (Signed)
Pt call to say she had fluid drawn off her knee. She thinks the following medicine oxycodone may be causing her to have nausea. She is asking if something can be called in for her nausea    Pharmacy  [facility]

## 2015-10-23 NOTE — Telephone Encounter (Signed)
Pt.notified

## 2015-10-29 ENCOUNTER — Ambulatory Visit
Admission: RE | Admit: 2015-10-29 | Discharge: 2015-10-29 | Disposition: A | Discharge status: Home / Self Care | Payer: Medicare Other | Source: Ambulatory Visit | Attending: Obstetrics and Gynecology | Admitting: Obstetrics and Gynecology

## 2015-10-29 ENCOUNTER — Other Ambulatory Visit: Payer: Self-pay | Admitting: Obstetrics and Gynecology

## 2015-10-29 DIAGNOSIS — R928 Other abnormal and inconclusive findings on diagnostic imaging of breast: Secondary | ICD-10-CM

## 2015-11-02 DIAGNOSIS — G8929 Other chronic pain: Secondary | ICD-10-CM | POA: Diagnosis not present

## 2015-11-02 DIAGNOSIS — I10 Essential (primary) hypertension: Secondary | ICD-10-CM | POA: Diagnosis not present

## 2015-11-02 DIAGNOSIS — S82841D Displaced bimalleolar fracture of right lower leg, subsequent encounter for closed fracture with routine healing: Secondary | ICD-10-CM | POA: Diagnosis not present

## 2015-11-02 DIAGNOSIS — M6281 Muscle weakness (generalized): Secondary | ICD-10-CM | POA: Diagnosis not present

## 2015-11-02 DIAGNOSIS — F419 Anxiety disorder, unspecified: Secondary | ICD-10-CM | POA: Diagnosis not present

## 2015-11-02 DIAGNOSIS — G2401 Drug induced subacute dyskinesia: Secondary | ICD-10-CM | POA: Diagnosis not present

## 2015-11-03 DIAGNOSIS — M25562 Pain in left knee: Secondary | ICD-10-CM | POA: Diagnosis not present

## 2015-11-03 DIAGNOSIS — M25462 Effusion, left knee: Secondary | ICD-10-CM | POA: Diagnosis not present

## 2015-11-04 DIAGNOSIS — F419 Anxiety disorder, unspecified: Secondary | ICD-10-CM | POA: Diagnosis not present

## 2015-11-04 DIAGNOSIS — G8929 Other chronic pain: Secondary | ICD-10-CM | POA: Diagnosis not present

## 2015-11-04 DIAGNOSIS — M6281 Muscle weakness (generalized): Secondary | ICD-10-CM | POA: Diagnosis not present

## 2015-11-04 DIAGNOSIS — G2401 Drug induced subacute dyskinesia: Secondary | ICD-10-CM | POA: Diagnosis not present

## 2015-11-04 DIAGNOSIS — S82841D Displaced bimalleolar fracture of right lower leg, subsequent encounter for closed fracture with routine healing: Secondary | ICD-10-CM | POA: Diagnosis not present

## 2015-11-04 DIAGNOSIS — I10 Essential (primary) hypertension: Secondary | ICD-10-CM | POA: Diagnosis not present

## 2015-11-09 ENCOUNTER — Ambulatory Visit: Payer: Medicare Other | Admitting: Neurology

## 2015-11-09 ENCOUNTER — Telehealth: Payer: Self-pay | Admitting: Neurology

## 2015-11-09 NOTE — Telephone Encounter (Signed)
Patient is calling and states that she feels she needs a higher dosage of Rx pregabalin(LYRICA)100 mg 3 x day.  Please call. Thanks!

## 2015-11-09 NOTE — Telephone Encounter (Signed)
Please call patient back: [name] is already on quite a high dose of Lyrica. I do not feel comfortable increasing this. This is something for Dr. Krista Blue to address when [name] is back, please explaine to patient.

## 2015-11-09 NOTE — Telephone Encounter (Signed)
Attempted to patient to let her know Dr. Krista Blue will be back in the office 11/10/15 - no answer or machine.  I will try again once this has been addressed by Dr. Krista Blue.

## 2015-11-10 DIAGNOSIS — M6281 Muscle weakness (generalized): Secondary | ICD-10-CM | POA: Diagnosis not present

## 2015-11-10 DIAGNOSIS — F419 Anxiety disorder, unspecified: Secondary | ICD-10-CM | POA: Diagnosis not present

## 2015-11-10 DIAGNOSIS — I10 Essential (primary) hypertension: Secondary | ICD-10-CM | POA: Diagnosis not present

## 2015-11-10 DIAGNOSIS — S82841D Displaced bimalleolar fracture of right lower leg, subsequent encounter for closed fracture with routine healing: Secondary | ICD-10-CM | POA: Diagnosis not present

## 2015-11-10 DIAGNOSIS — G8929 Other chronic pain: Secondary | ICD-10-CM | POA: Diagnosis not present

## 2015-11-10 DIAGNOSIS — G2401 Drug induced subacute dyskinesia: Secondary | ICD-10-CM | POA: Diagnosis not present

## 2015-11-10 MED ORDER — PREGABALIN 50 MG PO CAPS: ORAL_CAPSULE | ORAL | Status: DC

## 2015-11-10 NOTE — Telephone Encounter (Signed)
Patient returned Michelle's call. Please call [phone].

## 2015-11-10 NOTE — Telephone Encounter (Addendum)
Spoke with patient about the increased dosage offered below plus the potential side effects.  She would like to try Lyrica 150mg , TID.  She has approximately 6-7 weeks worth of Lyrica 100mg  and would like to use this supply.  She has requested a prescription be sent in for Lyrica 50mg  TID to take along with her 100mg  capsules, until her supply is gone (says medication is very expensive). At that point, she would like a prescription for Lyrica 150mg , TID.

## 2015-11-10 NOTE — Addendum Note (Signed)
Addended by: Noberto Retort C on: 11/10/2015 07:05 PM   Modules accepted: Orders

## 2015-11-10 NOTE — Telephone Encounter (Signed)
Please check with patient, patient is on polypharmacy treatment, currently taking Lyrica 100 mg 3 times a day, if Lyrica actually helps her, I can increase to 450 mg daily, but she will have increased chance of having side effect, especially with her polypharmacy treatment.  Common  Cardiovascular: Peripheral edema (5% to 12% )  Endocrine metabolic: Increased appetite (5% ), Weight gain (3.3% to 12% )  Gastrointestinal: Constipation (4% to 8.2% ), Xerostomia (2.3% to 11% )  Neurologic: Asthenia (5% to 10% ), Ataxia (3% to 15% ), Dizziness (9.1% to 42.7% ), Headache (5.6% to 9.3% ), Incoordination (2% to 10.1% ), Somnolence (10.2% to 35.7% ), Tremor (1% to 11.2% )  Ophthalmic: Blurred vision (3.4% to 10.1% ), Diplopia (2% to 9% )  Psychiatric: Disturbance in thinking (2% to 8% ), Euphoria (2% to 6% )  Respiratory: Nasopharyngitis (8.2% )  Other: Fatigue (7% to 11% )

## 2015-11-11 NOTE — Telephone Encounter (Signed)
Lyrica rx printed, signed, [facility]ed [facility] at [phone].

## 2015-11-12 DIAGNOSIS — I10 Essential (primary) hypertension: Secondary | ICD-10-CM | POA: Diagnosis not present

## 2015-11-12 DIAGNOSIS — S82841D Displaced bimalleolar fracture of right lower leg, subsequent encounter for closed fracture with routine healing: Secondary | ICD-10-CM | POA: Diagnosis not present

## 2015-11-12 DIAGNOSIS — F419 Anxiety disorder, unspecified: Secondary | ICD-10-CM | POA: Diagnosis not present

## 2015-11-12 DIAGNOSIS — G2401 Drug induced subacute dyskinesia: Secondary | ICD-10-CM | POA: Diagnosis not present

## 2015-11-12 DIAGNOSIS — G8929 Other chronic pain: Secondary | ICD-10-CM | POA: Diagnosis not present

## 2015-11-12 DIAGNOSIS — M6281 Muscle weakness (generalized): Secondary | ICD-10-CM | POA: Diagnosis not present

## 2015-11-13 ENCOUNTER — Other Ambulatory Visit: Payer: Self-pay | Admitting: Nurse Practitioner

## 2015-11-13 DIAGNOSIS — H02401 Unspecified ptosis of right eyelid: Secondary | ICD-10-CM | POA: Diagnosis not present

## 2015-11-13 DIAGNOSIS — H2512 Age-related nuclear cataract, left eye: Secondary | ICD-10-CM | POA: Diagnosis not present

## 2015-11-13 DIAGNOSIS — Z79899 Other long term (current) drug therapy: Secondary | ICD-10-CM | POA: Diagnosis not present

## 2015-11-13 DIAGNOSIS — Z961 Presence of intraocular lens: Secondary | ICD-10-CM | POA: Diagnosis not present

## 2015-11-13 DIAGNOSIS — H02833 Dermatochalasis of right eye, unspecified eyelid: Secondary | ICD-10-CM | POA: Diagnosis not present

## 2015-11-13 DIAGNOSIS — H401132 Primary open-angle glaucoma, bilateral, moderate stage: Secondary | ICD-10-CM | POA: Diagnosis not present

## 2015-11-13 DIAGNOSIS — Z7982 Long term (current) use of aspirin: Secondary | ICD-10-CM | POA: Diagnosis not present

## 2015-11-13 DIAGNOSIS — H02836 Dermatochalasis of left eye, unspecified eyelid: Secondary | ICD-10-CM | POA: Diagnosis not present

## 2015-11-13 DIAGNOSIS — H353131 Nonexudative age-related macular degeneration, bilateral, early dry stage: Secondary | ICD-10-CM | POA: Diagnosis not present

## 2015-11-18 DIAGNOSIS — S82841D Displaced bimalleolar fracture of right lower leg, subsequent encounter for closed fracture with routine healing: Secondary | ICD-10-CM | POA: Diagnosis not present

## 2015-11-18 DIAGNOSIS — F419 Anxiety disorder, unspecified: Secondary | ICD-10-CM | POA: Diagnosis not present

## 2015-11-18 DIAGNOSIS — G2401 Drug induced subacute dyskinesia: Secondary | ICD-10-CM | POA: Diagnosis not present

## 2015-11-18 DIAGNOSIS — M6281 Muscle weakness (generalized): Secondary | ICD-10-CM | POA: Diagnosis not present

## 2015-11-18 DIAGNOSIS — I10 Essential (primary) hypertension: Secondary | ICD-10-CM | POA: Diagnosis not present

## 2015-11-18 DIAGNOSIS — G8929 Other chronic pain: Secondary | ICD-10-CM | POA: Diagnosis not present

## 2015-11-20 DIAGNOSIS — F419 Anxiety disorder, unspecified: Secondary | ICD-10-CM | POA: Diagnosis not present

## 2015-11-20 DIAGNOSIS — I10 Essential (primary) hypertension: Secondary | ICD-10-CM | POA: Diagnosis not present

## 2015-11-20 DIAGNOSIS — G8929 Other chronic pain: Secondary | ICD-10-CM | POA: Diagnosis not present

## 2015-11-20 DIAGNOSIS — S82841D Displaced bimalleolar fracture of right lower leg, subsequent encounter for closed fracture with routine healing: Secondary | ICD-10-CM | POA: Diagnosis not present

## 2015-11-20 DIAGNOSIS — G2401 Drug induced subacute dyskinesia: Secondary | ICD-10-CM | POA: Diagnosis not present

## 2015-11-20 DIAGNOSIS — M6281 Muscle weakness (generalized): Secondary | ICD-10-CM | POA: Diagnosis not present

## 2015-11-23 DIAGNOSIS — I10 Essential (primary) hypertension: Secondary | ICD-10-CM | POA: Diagnosis not present

## 2015-11-23 DIAGNOSIS — S82841D Displaced bimalleolar fracture of right lower leg, subsequent encounter for closed fracture with routine healing: Secondary | ICD-10-CM | POA: Diagnosis not present

## 2015-11-23 DIAGNOSIS — M6281 Muscle weakness (generalized): Secondary | ICD-10-CM | POA: Diagnosis not present

## 2015-11-23 DIAGNOSIS — G2401 Drug induced subacute dyskinesia: Secondary | ICD-10-CM | POA: Diagnosis not present

## 2015-11-23 DIAGNOSIS — F419 Anxiety disorder, unspecified: Secondary | ICD-10-CM | POA: Diagnosis not present

## 2015-11-23 DIAGNOSIS — G8929 Other chronic pain: Secondary | ICD-10-CM | POA: Diagnosis not present

## 2015-11-27 DIAGNOSIS — M6281 Muscle weakness (generalized): Secondary | ICD-10-CM | POA: Diagnosis not present

## 2015-11-27 DIAGNOSIS — G8929 Other chronic pain: Secondary | ICD-10-CM | POA: Diagnosis not present

## 2015-11-27 DIAGNOSIS — I10 Essential (primary) hypertension: Secondary | ICD-10-CM | POA: Diagnosis not present

## 2015-11-27 DIAGNOSIS — F419 Anxiety disorder, unspecified: Secondary | ICD-10-CM | POA: Diagnosis not present

## 2015-11-27 DIAGNOSIS — S82841D Displaced bimalleolar fracture of right lower leg, subsequent encounter for closed fracture with routine healing: Secondary | ICD-10-CM | POA: Diagnosis not present

## 2015-11-27 DIAGNOSIS — G2401 Drug induced subacute dyskinesia: Secondary | ICD-10-CM | POA: Diagnosis not present

## 2015-11-30 ENCOUNTER — Encounter: Payer: Self-pay | Admitting: *Deleted

## 2015-11-30 ENCOUNTER — Other Ambulatory Visit: Payer: Self-pay | Admitting: General Surgery

## 2015-11-30 DIAGNOSIS — R928 Other abnormal and inconclusive findings on diagnostic imaging of breast: Secondary | ICD-10-CM | POA: Diagnosis not present

## 2015-11-30 DIAGNOSIS — R1030 Lower abdominal pain, unspecified: Secondary | ICD-10-CM | POA: Diagnosis not present

## 2015-11-30 MED ORDER — PREGABALIN 150 MG PO CAPS: 150.0000 mg | ORAL_CAPSULE | Freq: Three times a day (TID) | ORAL | Status: DC

## 2015-11-30 NOTE — Telephone Encounter (Signed)
It is okay to give her higher dose of Lyrica 150 mg 3 times a day, for her to potentially increase side effect with polypharmacy treatment. New prescription was written

## 2015-11-30 NOTE — Telephone Encounter (Signed)
Pt called in requesting refill for pregabalin (LYRICA) 100 MG capsule - she needs 150mg . She also is asking that the dosage be increased as well if possible. Please call and advise

## 2015-11-30 NOTE — Addendum Note (Signed)
Addended byOliver Hum on: 11/30/2015 08:49 AM   Modules accepted: Orders

## 2015-11-30 NOTE — Telephone Encounter (Signed)
Attempted to call pt and let her know lyrica 150mg  po tid to be faxed to pharmacy.  ? Phone (robo caller blocker). Could not LM.

## 2015-11-30 NOTE — Telephone Encounter (Signed)
I placed order for lyrica 150mg  po tid (this is pending),  then saw note wanting dosage increased. Please advise.

## 2015-11-30 NOTE — Addendum Note (Signed)
Addended by: Marcial Pacas on: 11/30/2015 03:01 PM   Modules accepted: Orders

## 2015-12-08 ENCOUNTER — Other Ambulatory Visit: Payer: Self-pay | Admitting: General Surgery

## 2015-12-08 DIAGNOSIS — R928 Other abnormal and inconclusive findings on diagnostic imaging of breast: Secondary | ICD-10-CM

## 2015-12-14 ENCOUNTER — Encounter (HOSPITAL_COMMUNITY)
Admission: RE | Admit: 2015-12-14 | Discharge: 2015-12-14 | Disposition: A | Discharge status: Home / Self Care | Payer: Medicare Other | Source: Ambulatory Visit | Attending: General Surgery | Admitting: General Surgery

## 2015-12-14 ENCOUNTER — Encounter (HOSPITAL_COMMUNITY): Payer: Self-pay

## 2015-12-14 DIAGNOSIS — M1990 Unspecified osteoarthritis, unspecified site: Secondary | ICD-10-CM | POA: Diagnosis not present

## 2015-12-14 DIAGNOSIS — Z7982 Long term (current) use of aspirin: Secondary | ICD-10-CM | POA: Diagnosis not present

## 2015-12-14 DIAGNOSIS — Z79899 Other long term (current) drug therapy: Secondary | ICD-10-CM | POA: Diagnosis not present

## 2015-12-14 DIAGNOSIS — Z8507 Personal history of malignant neoplasm of pancreas: Secondary | ICD-10-CM | POA: Diagnosis not present

## 2015-12-14 DIAGNOSIS — F332 Major depressive disorder, recurrent severe without psychotic features: Secondary | ICD-10-CM | POA: Diagnosis not present

## 2015-12-14 DIAGNOSIS — F329 Major depressive disorder, single episode, unspecified: Secondary | ICD-10-CM | POA: Diagnosis not present

## 2015-12-14 DIAGNOSIS — I10 Essential (primary) hypertension: Secondary | ICD-10-CM | POA: Diagnosis not present

## 2015-12-14 DIAGNOSIS — F419 Anxiety disorder, unspecified: Secondary | ICD-10-CM | POA: Diagnosis not present

## 2015-12-14 DIAGNOSIS — K5900 Constipation, unspecified: Secondary | ICD-10-CM | POA: Diagnosis not present

## 2015-12-14 DIAGNOSIS — R928 Other abnormal and inconclusive findings on diagnostic imaging of breast: Secondary | ICD-10-CM | POA: Diagnosis not present

## 2015-12-14 LAB — CBC
HEMATOCRIT: 36.3 % (ref 36.0–46.0)
Hemoglobin: 11.8 g/dL — ABNORMAL LOW (ref 12.0–15.0)
MCH: 30.6 pg (ref 26.0–34.0)
MCHC: 32.5 g/dL (ref 30.0–36.0)
MCV: 94.3 fL (ref 78.0–100.0)
PLATELETS: 149 10*3/uL — AB (ref 150–400)
RBC: 3.85 MIL/uL — ABNORMAL LOW (ref 3.87–5.11)
RDW: 16.2 % — AB (ref 11.5–15.5)
WBC: 8.3 10*3/uL (ref 4.0–10.5)

## 2015-12-14 LAB — BASIC METABOLIC PANEL
ANION GAP: 7 (ref 5–15)
BUN: 10 mg/dL (ref 6–20)
CALCIUM: 8.8 mg/dL — AB (ref 8.9–10.3)
CHLORIDE: 103 mmol/L (ref 101–111)
CO2: 27 mmol/L (ref 22–32)
CREATININE: 0.76 mg/dL (ref 0.44–1.00)
Glucose, Bld: 131 mg/dL — ABNORMAL HIGH (ref 65–99)
Potassium: 3.5 mmol/L (ref 3.5–5.1)
Sodium: 137 mmol/L (ref 135–145)

## 2015-12-14 MED ORDER — CHLORHEXIDINE GLUCONATE CLOTH 2 % EX PADS: 6.0000 | MEDICATED_PAD | Freq: Once | CUTANEOUS | Status: DC

## 2015-12-14 MED ORDER — CEFAZOLIN SODIUM-DEXTROSE 2-4 GM/100ML-% IV SOLN
2.0000 g | INTRAVENOUS | Status: AC
  Administered 2015-12-15: 2 g via INTRAVENOUS
  Filled 2015-12-14: qty 100

## 2015-12-14 NOTE — Pre-Procedure Instructions (Signed)
    [name]  12/14/2015      [id number] [address] [place], [place] [address] [place] [address] Phone: [phone] Fax: [phone]  [place] [place] [facility] [place] [address] [place] [address] Phone: [phone] Fax: [phone]    Your procedure is scheduled on 12/15/15.  Report to [facility] Admitting at 9 A.M.  Call this number if you have problems the morning of surgery:  [phone]   Remember:  Do not eat food or drink liquids after midnight.  Take these medicines the morning of surgery with A SIP OF WATER --atenolol,baclofen,valium,prozac,hydrocodone,synthroid,neurontin,psyllium   Do not wear jewelry, make-up or nail polish.  Do not wear lotions, powders, or perfumes.  You may wear deoderant.  Do not shave 48 hours prior to surgery.  Men may shave face and neck.  Do not bring valuables to the hospital.  [facility] is not responsible for any belongings or valuables.  Contacts, dentures or bridgework may not be worn into surgery.  Leave your suitcase in the car.  After surgery it may be brought to your room.  For patients admitted to the hospital, discharge time will be determined by your treatment team.  Patients discharged the day of surgery will not be allowed to drive home.   Name and phone number of your driver:    Special instructions:  Do not take any aspirin,anti-inflammatories,vitamins,or herbal supplements 5-7 days prior to surgery.  Please read over the following fact sheets that you were given.

## 2015-12-15 ENCOUNTER — Encounter (HOSPITAL_COMMUNITY)
Admission: RE | Disposition: A | Discharge status: Home / Self Care | Payer: Self-pay | Source: Ambulatory Visit | Attending: General Surgery

## 2015-12-15 ENCOUNTER — Encounter (HOSPITAL_COMMUNITY): Payer: Self-pay | Admitting: Surgery

## 2015-12-15 ENCOUNTER — Ambulatory Visit (HOSPITAL_COMMUNITY)
Admission: RE | Admit: 2015-12-15 | Discharge: 2015-12-15 | Disposition: A | Discharge status: Home / Self Care | Payer: Medicare Other | Source: Ambulatory Visit | Attending: General Surgery | Admitting: General Surgery

## 2015-12-15 ENCOUNTER — Ambulatory Visit (HOSPITAL_COMMUNITY): Payer: Medicare Other | Admitting: Certified Registered Nurse Anesthetist

## 2015-12-15 ENCOUNTER — Ambulatory Visit
Admission: RE | Admit: 2015-12-15 | Discharge: 2015-12-15 | Disposition: A | Discharge status: Home / Self Care | Payer: Medicare Other | Source: Ambulatory Visit | Attending: General Surgery | Admitting: General Surgery

## 2015-12-15 DIAGNOSIS — I10 Essential (primary) hypertension: Secondary | ICD-10-CM | POA: Insufficient documentation

## 2015-12-15 DIAGNOSIS — R928 Other abnormal and inconclusive findings on diagnostic imaging of breast: Secondary | ICD-10-CM

## 2015-12-15 DIAGNOSIS — Z7982 Long term (current) use of aspirin: Secondary | ICD-10-CM | POA: Diagnosis not present

## 2015-12-15 DIAGNOSIS — Z8507 Personal history of malignant neoplasm of pancreas: Secondary | ICD-10-CM | POA: Insufficient documentation

## 2015-12-15 DIAGNOSIS — Z79899 Other long term (current) drug therapy: Secondary | ICD-10-CM | POA: Diagnosis not present

## 2015-12-15 DIAGNOSIS — F329 Major depressive disorder, single episode, unspecified: Secondary | ICD-10-CM | POA: Diagnosis not present

## 2015-12-15 DIAGNOSIS — K5900 Constipation, unspecified: Secondary | ICD-10-CM | POA: Diagnosis not present

## 2015-12-15 DIAGNOSIS — M1990 Unspecified osteoarthritis, unspecified site: Secondary | ICD-10-CM | POA: Diagnosis not present

## 2015-12-15 DIAGNOSIS — R921 Mammographic calcification found on diagnostic imaging of breast: Secondary | ICD-10-CM | POA: Diagnosis not present

## 2015-12-15 DIAGNOSIS — F419 Anxiety disorder, unspecified: Secondary | ICD-10-CM | POA: Diagnosis not present

## 2015-12-15 DIAGNOSIS — K219 Gastro-esophageal reflux disease without esophagitis: Secondary | ICD-10-CM | POA: Diagnosis not present

## 2015-12-15 DIAGNOSIS — N6012 Diffuse cystic mastopathy of left breast: Secondary | ICD-10-CM | POA: Diagnosis not present

## 2015-12-15 HISTORY — PX: RADIOACTIVE SEED GUIDED EXCISIONAL BREAST BIOPSY: SHX6490

## 2015-12-15 SURGERY — RADIOACTIVE SEED GUIDED BREAST BIOPSY
Anesthesia: General | Site: Breast | Laterality: Left

## 2015-12-15 MED ORDER — MIDAZOLAM HCL 5 MG/5ML IJ SOLN
INTRAMUSCULAR | Status: DC | PRN
  Administered 2015-12-15: 1 mg via INTRAVENOUS

## 2015-12-15 MED ORDER — FENTANYL CITRATE (PF) 100 MCG/2ML IJ SOLN: 25.0000 ug | INTRAMUSCULAR | Status: DC | PRN

## 2015-12-15 MED ORDER — FENTANYL CITRATE (PF) 250 MCG/5ML IJ SOLN
INTRAMUSCULAR | Status: AC
  Filled 2015-12-15: qty 5

## 2015-12-15 MED ORDER — EPINEPHRINE HCL 0.1 MG/ML IJ SOSY
PREFILLED_SYRINGE | INTRAMUSCULAR | Status: AC
  Filled 2015-12-15: qty 10

## 2015-12-15 MED ORDER — MIDAZOLAM HCL 2 MG/2ML IJ SOLN
INTRAMUSCULAR | Status: AC
  Filled 2015-12-15: qty 2

## 2015-12-15 MED ORDER — HYDROCODONE-ACETAMINOPHEN 5-325 MG PO TABS: 1.0000 | ORAL_TABLET | Freq: Four times a day (QID) | ORAL | Status: DC | PRN

## 2015-12-15 MED ORDER — 0.9 % SODIUM CHLORIDE (POUR BTL) OPTIME
TOPICAL | Status: DC | PRN
  Administered 2015-12-15: 1000 mL

## 2015-12-15 MED ORDER — PROPOFOL 10 MG/ML IV BOLUS
INTRAVENOUS | Status: DC | PRN
  Administered 2015-12-15: 100 mg via INTRAVENOUS

## 2015-12-15 MED ORDER — ONDANSETRON HCL 4 MG/2ML IJ SOLN: 4.0000 mg | Freq: Once | INTRAMUSCULAR | Status: DC | PRN

## 2015-12-15 MED ORDER — LIDOCAINE HCL 1 % IJ SOLN
INTRAMUSCULAR | Status: DC | PRN
  Administered 2015-12-15: 20 mL

## 2015-12-15 MED ORDER — ROCURONIUM BROMIDE 50 MG/5ML IV SOLN
INTRAVENOUS | Status: AC
  Filled 2015-12-15: qty 3

## 2015-12-15 MED ORDER — MEPERIDINE HCL 25 MG/ML IJ SOLN: 6.2500 mg | INTRAMUSCULAR | Status: DC | PRN

## 2015-12-15 MED ORDER — LIDOCAINE 2% (20 MG/ML) 5 ML SYRINGE
INTRAMUSCULAR | Status: AC
  Filled 2015-12-15: qty 5

## 2015-12-15 MED ORDER — PHENYLEPHRINE HCL 10 MG/ML IJ SOLN
INTRAMUSCULAR | Status: DC | PRN
  Administered 2015-12-15: 80 ug via INTRAVENOUS
  Administered 2015-12-15: 100 ug via INTRAVENOUS
  Administered 2015-12-15: 80 ug via INTRAVENOUS

## 2015-12-15 MED ORDER — LIDOCAINE HCL (CARDIAC) 20 MG/ML IV SOLN
INTRAVENOUS | Status: DC | PRN
  Administered 2015-12-15: 100 mg via INTRAVENOUS

## 2015-12-15 MED ORDER — EPHEDRINE 5 MG/ML INJ
INTRAVENOUS | Status: AC
  Filled 2015-12-15: qty 30

## 2015-12-15 MED ORDER — FENTANYL CITRATE (PF) 100 MCG/2ML IJ SOLN
INTRAMUSCULAR | Status: DC | PRN
  Administered 2015-12-15: 50 ug via INTRAVENOUS

## 2015-12-15 MED ORDER — PROPOFOL 10 MG/ML IV BOLUS
INTRAVENOUS | Status: AC
  Filled 2015-12-15: qty 20

## 2015-12-15 MED ORDER — PHENYLEPHRINE 40 MCG/ML (10ML) SYRINGE FOR IV PUSH (FOR BLOOD PRESSURE SUPPORT)
PREFILLED_SYRINGE | INTRAVENOUS | Status: AC
  Filled 2015-12-15: qty 50

## 2015-12-15 MED ORDER — BUPIVACAINE-EPINEPHRINE (PF) 0.25% -1:200000 IJ SOLN
INTRAMUSCULAR | Status: AC
  Filled 2015-12-15: qty 30

## 2015-12-15 MED ORDER — LACTATED RINGERS IV SOLN
INTRAVENOUS | Status: DC
  Administered 2015-12-15 (×2): via INTRAVENOUS

## 2015-12-15 MED ORDER — LIDOCAINE HCL (PF) 1 % IJ SOLN
INTRAMUSCULAR | Status: AC
  Filled 2015-12-15: qty 30

## 2015-12-15 SURGICAL SUPPLY — 50 items
APPLIER CLIP 9.375 MED OPEN (MISCELLANEOUS)
BINDER BREAST LRG (GAUZE/BANDAGES/DRESSINGS) ×3 IMPLANT
BINDER BREAST XLRG (GAUZE/BANDAGES/DRESSINGS) IMPLANT
BLADE SURG 15 STRL LF DISP TIS (BLADE) ×1 IMPLANT
BLADE SURG 15 STRL SS (BLADE) ×2
CANISTER SUCTION 2500CC (MISCELLANEOUS) ×3 IMPLANT
CHLORAPREP W/TINT 26ML (MISCELLANEOUS) ×3 IMPLANT
CLIP APPLIE 9.375 MED OPEN (MISCELLANEOUS) IMPLANT
CLIP TI LARGE 6 (CLIP) IMPLANT
CLOSURE STERI-STRIP 1/2X4 (GAUZE/BANDAGES/DRESSINGS) ×1
CLSR STERI-STRIP ANTIMIC 1/2X4 (GAUZE/BANDAGES/DRESSINGS) ×2 IMPLANT
COVER PROBE W GEL 5X96 (DRAPES) ×3 IMPLANT
COVER SURGICAL LIGHT HANDLE (MISCELLANEOUS) ×3 IMPLANT
DERMABOND ADHESIVE PROPEN (GAUZE/BANDAGES/DRESSINGS) ×2
DERMABOND ADVANCED .7 DNX6 (GAUZE/BANDAGES/DRESSINGS) ×1 IMPLANT
DEVICE DUBIN SPECIMEN MAMMOGRA (MISCELLANEOUS) ×3 IMPLANT
DRAPE CHEST BREAST 15X10 FENES (DRAPES) ×3 IMPLANT
DRAPE UTILITY XL STRL (DRAPES) ×3 IMPLANT
DRSG PAD ABDOMINAL 8X10 ST (GAUZE/BANDAGES/DRESSINGS) ×3 IMPLANT
ELECT CAUTERY BLADE 6.4 (BLADE) ×3 IMPLANT
ELECT REM PT RETURN 9FT ADLT (ELECTROSURGICAL) ×3
ELECTRODE REM PT RTRN 9FT ADLT (ELECTROSURGICAL) ×1 IMPLANT
GLOVE BIO SURGEON STRL SZ 6 (GLOVE) ×3 IMPLANT
GLOVE BIOGEL PI IND STRL 6.5 (GLOVE) ×1 IMPLANT
GLOVE BIOGEL PI INDICATOR 6.5 (GLOVE) ×2
GOWN STRL REUS W/ TWL LRG LVL3 (GOWN DISPOSABLE) ×1 IMPLANT
GOWN STRL REUS W/TWL 2XL LVL3 (GOWN DISPOSABLE) ×3 IMPLANT
GOWN STRL REUS W/TWL LRG LVL3 (GOWN DISPOSABLE) ×2
ILLUMINATOR WAVEGUIDE N/F (MISCELLANEOUS) ×3 IMPLANT
KIT BASIN OR (CUSTOM PROCEDURE TRAY) ×3 IMPLANT
KIT MARKER MARGIN INK (KITS) ×3 IMPLANT
LIGHT WAVEGUIDE WIDE FLAT (MISCELLANEOUS) IMPLANT
NEEDLE HYPO 25X1 1.5 SAFETY (NEEDLE) ×3 IMPLANT
NS IRRIG 1000ML POUR BTL (IV SOLUTION) IMPLANT
PACK SURGICAL SETUP 50X90 (CUSTOM PROCEDURE TRAY) ×3 IMPLANT
PENCIL BUTTON HOLSTER BLD 10FT (ELECTRODE) ×3 IMPLANT
SPONGE GAUZE 4X4 12PLY STER LF (GAUZE/BANDAGES/DRESSINGS) ×3 IMPLANT
SPONGE LAP 18X18 X RAY DECT (DISPOSABLE) ×3 IMPLANT
SUT MNCRL AB 4-0 PS2 18 (SUTURE) ×3 IMPLANT
SUT VIC AB 2-0 SH 27 (SUTURE) ×2
SUT VIC AB 2-0 SH 27XBRD (SUTURE) ×1 IMPLANT
SUT VIC AB 3-0 SH 27 (SUTURE) ×2
SUT VIC AB 3-0 SH 27X BRD (SUTURE) ×1 IMPLANT
SYR BULB 3OZ (MISCELLANEOUS) ×3 IMPLANT
SYR CONTROL 10ML LL (SYRINGE) ×3 IMPLANT
TOWEL OR 17X24 6PK STRL BLUE (TOWEL DISPOSABLE) ×3 IMPLANT
TOWEL OR 17X26 10 PK STRL BLUE (TOWEL DISPOSABLE) ×3 IMPLANT
TUBE CONNECTING 12'X1/4 (SUCTIONS) ×1
TUBE CONNECTING 12X1/4 (SUCTIONS) ×2 IMPLANT
YANKAUER SUCT BULB TIP NO VENT (SUCTIONS) ×3 IMPLANT

## 2015-12-15 NOTE — Anesthesia Preprocedure Evaluation (Addendum)
Anesthesia Evaluation  Patient identified by MRN, date of birth, ID band Patient awake    Reviewed: Allergy & Precautions, H&P , NPO status , Patient's Chart, lab work & pertinent test results, reviewed documented beta blocker date and time   Airway Mallampati: III  TM Distance: >3 FB Neck ROM: Full    Dental no notable dental hx. (+) Upper Dentures, Partial Lower, Dental Advisory Given   Pulmonary neg pulmonary ROS,    Pulmonary exam normal breath sounds clear to auscultation       Cardiovascular hypertension, Pt. on medications and Pt. on home beta blockers + Valvular Problems/Murmurs MVP  Rhythm:Regular Rate:Normal     Neuro/Psych Anxiety Depression CVA    GI/Hepatic Neg liver ROS, GERD  Medicated and Controlled,  Endo/Other  negative endocrine ROSHypothyroidism   Renal/GU negative Renal ROS  negative genitourinary   Musculoskeletal  (+) Arthritis , Osteoarthritis,    Abdominal   Peds  Hematology negative hematology ROS (+)   Anesthesia Other Findings   Reproductive/Obstetrics negative OB ROS                            Anesthesia Physical  Anesthesia Plan  ASA: III  Anesthesia Plan: General   Post-op Pain Management: GA combined w/ Regional for post-op pain   Induction: Intravenous  Airway Management Planned: LMA  Additional Equipment:   Intra-op Plan:   Post-operative Plan:   Informed Consent: I have reviewed the patients History and Physical, chart, labs and discussed the procedure including the risks, benefits and alternatives for the proposed anesthesia with the patient or authorized representative who has indicated his/her understanding and acceptance.     Plan Discussed with: CRNA, Anesthesiologist and Surgeon  Anesthesia Plan Comments:        Anesthesia Quick Evaluation

## 2015-12-15 NOTE — Anesthesia Procedure Notes (Signed)
Procedure Name: LMA Insertion Date/Time: 12/15/2015 11:48 AM Performed by: Oletta Lamas Pre-anesthesia Checklist: Patient identified, Emergency Drugs available, Suction available and Patient being monitored Patient Re-evaluated:Patient Re-evaluated prior to inductionOxygen Delivery Method: Circle System Utilized Preoxygenation: Pre-oxygenation with 100% oxygen Intubation Type: IV induction Ventilation: Mask ventilation without difficulty LMA: LMA inserted LMA Size: 3.0 Number of attempts: 1 Placement Confirmation: positive ETCO2 Tube secured with: Tape Dental Injury: Teeth and Oropharynx as per pre-operative assessment

## 2015-12-15 NOTE — Transfer of Care (Addendum)
Immediate Anesthesia Transfer of Care Note  Patient: [name]  Procedure(s) Performed: Procedure(s): LEFT RADIOACTIVE SEED GUIDED EXCISIONAL BREAST BIOPSY (Left)  Patient Location: PACU  Anesthesia Type:General  Level of Consciousness: awake, alert  and patient cooperative  Airway & Oxygen Therapy: Patient connected to face mask oxygen / Spontaneous rr Post-op Assessment: Report given to RN and Post -op Vital signs reviewed and stable  Post vital signs: Reviewed and stable  Last Vitals:  Filed Vitals:   12/15/15 0940  BP: 146/57  Pulse: 67  Temp: 36.7 C  Resp: 20    Last Pain:  Filed Vitals:   12/15/15 1024  PainSc: 6       Patients Stated Pain Goal: 3 ([id number])  Complications: No apparent anesthesia complications

## 2015-12-15 NOTE — Interval H&P Note (Signed)
History and Physical Interval Note:  12/15/2015 11:25 AM  [name]  has presented today for surgery, with the diagnosis of left abnormal mammogram  The various methods of treatment have been discussed with the patient and family. After consideration of risks, benefits and other options for treatment, the patient has consented to  Procedure(s): LEFT RADIOACTIVE SEED GUIDED EXCISIONAL BREAST BIOPSY (Left) as a surgical intervention .  The patient's history has been reviewed, patient examined, no change in status, stable for surgery.  I have reviewed the patient's chart and labs.  Questions were answered to the patient's satisfaction.     [name]

## 2015-12-15 NOTE — Progress Notes (Signed)
Son with pt dentures returned to pt

## 2015-12-15 NOTE — Discharge Instructions (Signed)
[facility] °Office Phone Number [phone] ° °BREAST BIOPSY/ PARTIAL MASTECTOMY: POST OP INSTRUCTIONS ° °Always review your discharge instruction sheet given to you by the facility where your surgery was performed. ° °IF YOU HAVE DISABILITY OR FAMILY LEAVE FORMS, YOU MUST BRING THEM TO THE OFFICE FOR PROCESSING.  DO NOT GIVE THEM TO YOUR DOCTOR. ° °1. A prescription for pain medication may be given to you upon discharge.  Take your pain medication as prescribed, if needed.  If narcotic pain medicine is not needed, then you may take acetaminophen (Tylenol) or ibuprofen (Advil) as needed. °2. Take your usually prescribed medications unless otherwise directed °3. If you need a refill on your pain medication, please contact your pharmacy.  They will contact our office to request authorization.  Prescriptions will not be filled after 5pm or on week-ends. °4. You should eat very light the first 24 hours after surgery, such as soup, crackers, pudding, etc.  Resume your normal diet the day after surgery. °5. Most patients will experience some swelling and bruising in the breast.  Ice packs and a good support bra will help.  Swelling and bruising can take several days to resolve.  °6. It is common to experience some constipation if taking pain medication after surgery.  Increasing fluid intake and taking a stool softener will usually help or prevent this problem from occurring.  A mild laxative (Milk of Magnesia or Miralax) should be taken according to package directions if there are no bowel movements after 48 hours. °7. Unless discharge instructions indicate otherwise, you may remove your bandages 48 hours after surgery, and you may shower at that time.  You may have steri-strips (small skin tapes) in place directly over the incision.  These strips should be left on the skin for 7-10 days.   Any sutures or staples will be removed at the office during your follow-up visit. °8. ACTIVITIES:  You may resume  regular daily activities (gradually increasing) beginning the next day.  Wearing a good support bra or sports bra (or the breast binder) minimizes pain and swelling.  You may have sexual intercourse when it is comfortable. °a. You may drive when you no longer are taking prescription pain medication, you can comfortably wear a seatbelt, and you can safely maneuver your car and apply brakes. °b. RETURN TO WORK:  __________1 week_______________ °9. You should see your doctor in the office for a follow-up appointment approximately two weeks after your surgery.  Your doctor’s nurse will typically make your follow-up appointment when she calls you with your pathology report.  Expect your pathology report 2-3 business days after your surgery.  You may call to check if you do not hear from us after three days. ° ° °WHEN TO CALL YOUR DOCTOR: °1. Fever over 101.0 °2. Nausea and/or vomiting. °3. Extreme swelling or bruising. °4. Continued bleeding from incision. °5. Increased pain, redness, or drainage from the incision. ° °The clinic staff is available to answer your questions during regular business hours.  Please don’t hesitate to call and ask to speak to one of the nurses for clinical concerns.  If you have a medical emergency, go to the nearest emergency room or call 911.  A surgeon from [facility] is always on call at the hospital. ° °For further questions, please visit [email or web address]  ° °

## 2015-12-15 NOTE — H&P (Signed)
[name]. [name] 11/30/2015 10:14 AM Location: [facility] Patient #: [id number] DOB: [birth year] Widowed / Language: [name] / Race: White Female   History of Present Illness [name] Klein MD; 11/30/2015 12:42 PM) Patient words: discuss breast bx & hernia repair.  The patient is a 75 year old female who presents with a complaint of Breast problems. Pt is a 75 yo F who i have been following for pancreatic cancer and a subsequent hernia who developed an abnormal mammogram of her left breast. A needle biopsy was attempted, but pt could not tolerate the procedure. She would like to have surgical excision. Pt's mother had breast cancer and died from metastatic dx.  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 This is of unclear origin. She takes lyrica for this TID, but still has significant trouble sleeping. She feels that her lack of sleep and her pain overall caused issues with the biopsy.  dx mammogram 6/1 RECOMMENDATION: Stereotactic biopsy is recommended. However, the patient has history of tardive dyskinesia, and it may be that percutaneous biopsy will not be possible. The patient has an appointment with Dr. [name], on May 24th for follow-up for pancreatic cancer. I recommended that the plan for the calcifications biopsy be discussed with Dr. [name]. A stereotactic biopsy has been scheduled for the time being on 10/29/2015 unless there is a planned change following discussion with Dr. [name].  I have discussed the findings and recommendations with the patient. Results were also provided in writing at the conclusion of the visit. If applicable, a reminder letter will be sent to the patient regarding the next appointment.  BI-RADS CATEGORY 4: Suspicious abnormality - biopsy should be considered.  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 Tumor markers included a CA 19 - 9 level of 2.8 at diagnosis. Initial presentation was 8 month(s) ago for fatigue and abdominal pain. Treatment has included pancreaticoduodenectomy (Whipple procedure 07/2013). She had a prolonged recovery period with delayed gastric emptying and had to go to a rehab facility for a brief time. She did not get any chemotherapy as she had such a prolonged recovery. ]  She continues to have lower abdominal pain. This is lower than the hernia. She feels that it radiates to the right back. She continues to have constipation.    Allergies [name], CMA; 11/30/2015 10:15 AM) Niacin (Antihyperlipidemic) *ANTIHYPERLIPIDEMICS* Benzocaine Oral Anesthetic *MOUTH/THROAT/DENTAL AGENTS* Celecoxib *ANALGESICS - ANTI-INFLAMMATORY* Glucosamine *ALTERNATIVE MEDICINES* Phenazopyridine HCl *GENITOURINARY AGENTS - MISCELLANEOUS* Sulfa Antibiotics Trovan *FLUOROQUINOLONES*  Medication History [name], CMA; 11/30/2015 10:19 AM) Creon (12000UNIT Capsule DR Part, 2 (two) Capsule DR Part Oral 3 times a day before meals, Taken starting 11/04/2015) Active. Antivert (12.5MG  Tablet, Oral) Active. Boost (Oral) Active. Aspirin EC (81MG  Tablet DR, Oral) Active. Atenolol (25MG  Tablet, Oral) Active. Colace (100MG  Capsule, Oral) Active. Vitamin D (1000UNIT Capsule, Oral) Active. PROzac (20MG  Capsule, Oral) Active. Gabapentin (300MG  Capsule, Oral) Active. HydrOXYzine HCl (25MG  Tablet, Oral) Active. Levothyroxine Sodium (175MCG Tablet, Oral) Active. Claritin (10MG  Tablet, Oral) Active. Ativan (0.5MG  Tablet, Oral) Active. Mevacor (40MG  Tablet, Oral) Active. Multivitamin & Mineral (Oral) Active. Pamelor (25MG  Capsule, Oral) Active. Omeprazole (20MG  Capsule DR, Oral) Active. Topamax (200MG  Tablet, Oral) Active. Vitamin E Skin (4000UNIT Cream, External) Active. Medications  Reconciled    Review of Systems [name] Klein MD; 11/30/2015 12:41  PM) All other systems negative  Vitals [name] CMA; 11/30/2015 10:20 AM) 11/30/2015 10:19 AM Weight: 151.13 lb Height: 61in Height was reported by patient. Body Surface Area: 1.68 m Body Mass Index: 28.55 kg/m  Temp.: 98.58F(Oral)  Pulse: 74 (Regular)  P.OX: 96% (Room air) BP: 160/84 (Sitting, Right Arm, Standard)       Physical Exam [name] Klein MD; 11/30/2015 12:44 PM) General Mental Status-Alert. General Appearance-Consistent with stated age. Hydration-Well hydrated. Voice-Normal.  Head and Neck Head-normocephalic, atraumatic with no lesions or palpable masses.  Eye Sclera/Conjunctiva - Bilateral-No scleral icterus.  Chest and Lung Exam Chest and lung exam reveals -quiet, even and easy respiratory effort with no use of accessory muscles. Inspection Chest Wall - Normal. Back - normal.  Breast Note: symmetric bilaterally. no palpable masses. breasts tender bilaterally c/w fibrocystic change. no LAD. no nipple retraction or skin dimpling.   Cardiovascular Cardiovascular examination reveals -normal pedal pulses bilaterally. Note: regular rate and rhythm  Abdomen Inspection-Inspection Normal. Palpation/Percussion Palpation and Percussion of the abdomen reveal - Soft, No Rebound tenderness, No Rigidity (guarding) and No hepatosplenomegaly. Note: tender BLQ. Feels like prominent stool present in lower abdomen. Hernia feels about the same. defect 4x6 cm at transverse component just to right of midline. hernia edges are non tender. Auscultation Auscultation of the abdomen reveals - Bowel sounds normal.  Peripheral Vascular Upper Extremity Inspection - Bilateral - Normal - No Clubbing, No Cyanosis, No Edema, Pulses Intact. Lower Extremity Palpation - Edema - Bilateral - No edema.  Neurologic Neurologic evaluation reveals -alert and oriented x 3 with no  impairment of recent or remote memory. Mental Status-Normal.  Musculoskeletal Global Assessment -Note: Right SI joint prominent.  Normal Exam - Left-Upper Extremity Strength Normal and Lower Extremity Strength Normal. Normal Exam - Right-Upper Extremity Strength Normal and Lower Extremity Strength Normal.  Lymphatic Head & Neck  General Head & Neck Lymphatics: Bilateral - Description - Normal. Axillary  General Axillary Region: Bilateral - Description - Normal. Tenderness - Non Tender.    Assessment & Plan [name] Klein MD; 11/30/2015 12:45 PM) ABNORMAL MAMMOGRAM OF LEFT BREAST (R92.8) Impression: Pt will need bx.  Discussed seed localized left breast excisional biopsy. The surgical procedure was described to the patient. I discussed the incision type and location and that we would need radiology involved on with a wire or seed marker and/or sentinel node.  The risks and benefits of the procedure were described to the patient and she wishes to proceed.  We discussed the risks bleeding, infection, damage to other structures, need for further procedures/surgeries. We discussed the risk of seroma. The patient was advised if the area in the breast in cancer, we may need to go back to surgery for additional tissue to obtain negative margins or for a lymph node biopsy. The patient was advised that these are the most common complications, but that others can occur as well. They were advised against taking aspirin or other anti-inflammatory agents/blood thinners the week before surgery.  30 min spent in evaluation, examination, counseling, and coordination of care. >50% spent in counseling. Current Plans Pt Education - [facility] Breast Biopsy HCI: discussed with patient and provided information. You are being scheduled for surgery - Our schedulers will call you.  You should hear from our office's scheduling department within 5 working days about the location, date, and time of surgery. We  try to make accommodations for patient's preferences in scheduling surgery, but sometimes the OR schedule or the surgeon's schedule prevents [place] from  making those accommodations.  If you have not heard from our office [phone]) in 5 working days, call the office and ask for your surgeon's nurse.  If you have other questions about your diagnosis, plan, or surgery, call the office and ask for your surgeon's nurse.  LOWER ABDOMINAL PAIN (R10.30) Impression: I do not think this is related to the hernia. I think this is because of constipation. Will not fix hernia at the same time as breast surgery.    Signed by [name] Klein, MD (11/30/2015 12:46 PM)

## 2015-12-15 NOTE — Anesthesia Postprocedure Evaluation (Signed)
Anesthesia Post Note  Patient: [name]  Procedure(s) Performed: Procedure(s) (LRB): LEFT RADIOACTIVE SEED GUIDED EXCISIONAL BREAST BIOPSY (Left)  Patient location during evaluation: PACU Anesthesia Type: General Level of consciousness: awake Pain management: pain level controlled Vital Signs Assessment: post-procedure vital signs reviewed and stable Respiratory status: spontaneous breathing Cardiovascular status: stable Postop Assessment: no signs of nausea or vomiting Anesthetic complications: no     Last Vitals:  Filed Vitals:   12/15/15 1300 12/15/15 1315  BP: 157/88   Pulse: 81 80  Temp:  36.6 C  Resp: 18 18    Last Pain:  Filed Vitals:   12/15/15 1323  PainSc: 0-No pain   Pain Goal: Patients Stated Pain Goal: 3 (12/15/15 1023)               Chermaine Schnyder JR,JOHN Mateo Flow

## 2015-12-15 NOTE — Op Note (Signed)
Left Breast Radioactive seed localized excisional biopsy  Indications: This patient presents with history of left abnormal mammogram  Pre-operative Diagnosis: left abnormal mammogram  Post-operative Diagnosis: Same  Surgeon: ,   Anesthesia: General endotracheal anesthesia  ASA Class: 3  Procedure Details  The patient was seen in the Holding Room. The risks, benefits, complications, treatment options, and expected outcomes were discussed with the patient. The possibilities of bleeding, infection, the need for additional procedures, failure to diagnose a condition, and creating a complication requiring transfusion or operation were discussed with the patient. The patient concurred with the proposed plan, giving informed consent.  The site of surgery properly noted/marked. The patient was taken to Operating Room # 9, identified, and the procedure verified as Left Breast seed localized lumpectomy. A Time Out was held and the above information confirmed.  The left breast and chest were prepped and draped in standard fashion. The lumpectomy was performed by creating an transverse incision over the upper inner quadrant of the breast over the previously placed radioactive seed.  Dissection was carried down to around the point of maximum signal intensity. The cautery was used to perform the dissection.  Hemostasis was achieved with cautery.    The specimen was inked with the margin marker paint kit.    Specimen radiography confirmed inclusion of the mammographic lesion, the clip, and the seed.  The background signal in the breast was zero.  The wound was irrigated and closed with 3-0 vicryl in layers and 4-0 monocryl subcuticular suture.      Sterile dressings were applied. At the end of the operation, all sponge, instrument, and needle counts were correct.  Findings: grossly clear surgical margins and no adenopathy  Estimated Blood Loss:  min         Specimens: left breast seed localized  lumpectomy.           Complications:  None; patient tolerated the procedure well.         Disposition: PACU - hemodynamically stable.         Condition: stable   

## 2015-12-16 ENCOUNTER — Encounter (HOSPITAL_COMMUNITY): Payer: Self-pay | Admitting: General Surgery

## 2015-12-21 NOTE — Progress Notes (Signed)
Please let patient and her son know that pathology is benign!

## 2015-12-22 DIAGNOSIS — M25462 Effusion, left knee: Secondary | ICD-10-CM | POA: Diagnosis not present

## 2015-12-22 DIAGNOSIS — M25562 Pain in left knee: Secondary | ICD-10-CM | POA: Diagnosis not present

## 2015-12-28 ENCOUNTER — Telehealth: Payer: Self-pay | Admitting: Neurology

## 2015-12-28 NOTE — Telephone Encounter (Signed)
Spoke to [name] (son on [employer]) - states his mother is still having significant pain and burning in her bilateral feet.  She is currently taking Lyrica 150mg , TID and Trileptal 150mg , BID.  She has resorted to also using hydrocodone, sparingly, for relief.  She is requesting a work-in appt.

## 2015-12-28 NOTE — Telephone Encounter (Signed)
Pt's son called in to speak with nurse , who took call.

## 2015-12-28 NOTE — Telephone Encounter (Signed)
Spoke to Dr. Jannifer Franklin and he is able to see this patient 12/29/15 at noon.  She will arrive to our office at 11:45am.

## 2015-12-28 NOTE — Telephone Encounter (Signed)
Patient's husband is calling in regard to getting approval for a higher dosage of the pregabalin 150 mg for his mother. He states she feels she needs it.   Also she will be out of her Rx oxcarbazepine 150 mg in a couple of days and is asking if Dr. Krista Blue can refill even though Dr. Wendee Beavers at [facility] prescribed this for her.  Please call.

## 2015-12-28 NOTE — Telephone Encounter (Signed)
Left message for a return call

## 2015-12-29 ENCOUNTER — Ambulatory Visit (INDEPENDENT_AMBULATORY_CARE_PROVIDER_SITE_OTHER): Payer: Medicare Other | Admitting: Neurology

## 2015-12-29 ENCOUNTER — Encounter: Payer: Self-pay | Admitting: Neurology

## 2015-12-29 VITALS — BP 130/78 | HR 78 | Ht 61.0 in | Wt 150.0 lb

## 2015-12-29 DIAGNOSIS — G609 Hereditary and idiopathic neuropathy, unspecified: Secondary | ICD-10-CM

## 2015-12-29 MED ORDER — OXCARBAZEPINE 150 MG PO TABS: ORAL_TABLET | ORAL | 3 refills | Status: DC

## 2015-12-29 NOTE — Patient Instructions (Addendum)
With the Lyrica 150 mg, continue taking one three times a day. With the Trileptal 150 mg tablet, begin taking one in the morning and 2 in the evening for one week, then take one in the morning and 3 in the evening.   Trileptal (oxcarbazepine) is a seizure medication that is sometimes also used for nerve related pain. As with any seizure medication, this drug may worsen depression. Occasionally, there may be other side effects that include low sodium levels, drowsiness, incoordination, dizziness, headache, nausea, double vision, or a potential allergy involving a skin rash. If you believe that you are having side effects on this medication, please contact our office.  Peripheral Neuropathy Peripheral neuropathy is a type of nerve damage. It affects nerves that carry signals between the spinal cord and other parts of the body. These are called peripheral nerves. With peripheral neuropathy, one nerve or a group of nerves may be damaged.  CAUSES  Many things can damage peripheral nerves. For some people with peripheral neuropathy, the cause is unknown. Some causes include:  Diabetes. This is the most common cause of peripheral neuropathy.  Injury to a nerve.  Pressure or stress on a nerve that lasts a long time.  Too little vitamin B. Alcoholism can lead to this.  Infections.  Autoimmune diseases, such as multiple sclerosis and systemic lupus erythematosus.  Inherited nerve diseases.  Some medicines, such as cancer drugs.  Toxic substances, such as lead and mercury.  Too little blood flowing to the legs.  Kidney disease.  Thyroid disease. SIGNS AND SYMPTOMS  Different people have different symptoms. The symptoms you have will depend on which of your nerves is damaged. Common symptoms include:  Loss of feeling (numbness) in the feet and hands.  Tingling in the feet and hands.  Pain that burns.  Very sensitive skin.  Weakness.  Not being able to move a part of the body  (paralysis).  Muscle twitching.  Clumsiness or poor coordination.  Loss of balance.  Not being able to control your bladder.  Feeling dizzy.  Sexual problems. DIAGNOSIS  Peripheral neuropathy is a symptom, not a disease. Finding the cause of peripheral neuropathy can be hard. To figure that out, your health care provider will take a medical history and do a physical exam. A neurological exam will also be done. This involves checking things affected by your brain, spinal cord, and nerves (nervous system). For example, your health care provider will check your reflexes, how you move, and what you can feel.  Other types of tests may also be ordered, such as:  Blood tests.  A test of the fluid in your spinal cord.  Imaging tests, such as CT scans or an MRI.  Electromyography (EMG). This test checks the nerves that control muscles.  Nerve conduction velocity tests. These tests check how fast messages pass through your nerves.  Nerve biopsy. A small piece of nerve is removed. It is then checked under a microscope. TREATMENT   Medicine is often used to treat peripheral neuropathy. Medicines may include:  Pain-relieving medicines. Prescription or over-the-counter medicine may be suggested.  Antiseizure medicine. This may be used for pain.  Antidepressants. These also may help ease pain from neuropathy.  Lidocaine. This is a numbing medicine. You might wear a patch or be given a shot.  Mexiletine. This medicine is typically used to help control irregular heart rhythms.  Surgery. Surgery may be needed to relieve pressure on a nerve or to destroy a nerve that  is causing pain.  Physical therapy to help movement.  Assistive devices to help movement. HOME CARE INSTRUCTIONS   Only take over-the-counter or prescription medicines as directed by your health care provider. Follow the instructions carefully for any given medicines. Do not take any other medicines without first getting  approval from your health care provider.  If you have diabetes, work closely with your health care provider to keep your blood sugar under control.  If you have numbness in your feet:  Check every day for signs of injury or infection. Watch for redness, warmth, and swelling.  Wear padded socks and comfortable shoes. These help protect your feet.  Do not do things that put pressure on your damaged nerve.  Do not smoke. Smoking keeps blood from getting to damaged nerves.  Avoid or limit alcohol. Too much alcohol can cause a lack of B vitamins. These vitamins are needed for healthy nerves.  Develop a good support system. Coping with peripheral neuropathy can be stressful. Talk to a mental health specialist or join a support group if you are struggling.  Follow up with your health care provider as directed. SEEK MEDICAL CARE IF:   You have new signs or symptoms of peripheral neuropathy.  You are struggling emotionally from dealing with peripheral neuropathy.  You have a fever. SEEK IMMEDIATE MEDICAL CARE IF:   You have an injury or infection that is not healing.  You feel very dizzy or begin vomiting.  You have chest pain.  You have trouble breathing.   This information is not intended to replace advice given to you by your health care provider. Make sure you discuss any questions you have with your health care provider.   Document Released: 05/06/2002 Document Revised: 01/26/2011 Document Reviewed: 01/21/2013 Elsevier Interactive Patient Education [employer].

## 2015-12-29 NOTE — Progress Notes (Signed)
Reason for visit: Peripheral neuropathy  [name] is an 75 y.o. 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 The patient has had symptoms for greater than 10 years, the symptoms have gradually worsened over time. The patient is having difficulty with pain primarily at nighttime, but occasionally she may have a day where the discomfort is particularly acute in the feet. The patient does report some low back pain, particularly on the right side of the back without radiation down the legs. She has paresthesias of the feet with some occasional burning. The patient reports some mild gait instability, she currently is using a cane for ambulation. She denies any recent falls. She is on Lyrica taking 150 mg 3 times daily, she is on low-dose Trileptal taking 150 mg twice daily. These medications are not effective for the pain. She comes in today for an evaluation.  Past Medical History:  Diagnosis Date  . Allergic rhinitis   . Cancer ([place]) 07/10/13   Pancreatic cancer with MRI scan 06-19-13  . Chronic maxillary sinusitis    neti pot  . Depression    alone a lot  . Eustachian tube dysfunction   . GERD (gastroesophageal reflux disease)   . Glaucoma   . Heart murmur    hx. "MVP" -predental antibiotics  . Hiatal hernia   . Hypertension   . Hypothyroid   . Memory loss    short term memory loss  . Mitral valve prolapse    antibiotics before dental procedures  . Osteoarthritis   . Stroke [facility])    mini storkes left leg paraylsis. patient denies weakness 01/08/14.   . Tardive dyskinesia    possibly reglan, vitamin E helps  . Urgency of urination    some UTI in past    Past Surgical History:  Procedure Laterality Date  . 1 baker cyst removed    . ABDOMINAL HYSTERECTOMY     including ovaries  . BACK SURGERY     fusion  . BLEPHAROPLASTY Bilateral    with  cataract surgery  . BREAST SURGERY     Biopsy left 2 times  . COLONOSCOPY W/ POLYPECTOMY     2004 last colonoscopy, no polyps  . DILATION AND CURETTAGE OF UTERUS     x3  . ESOPHAGOGASTRODUODENOSCOPY N/A 09/11/2013   Procedure: ESOPHAGOGASTRODUODENOSCOPY (EGD);  Surgeon: Cleotis Nipper, MD;  Location: [facility] ENDOSCOPY;  Service: Endoscopy;  Laterality: N/A;  Moderate sedation okay if MAC not available  . EUS N/A 07/10/2013   Procedure: ESOPHAGEAL ENDOSCOPIC ULTRASOUND (EUS) RADIAL;  Surgeon: Arta Silence, MD;  Location: [facility] ENDOSCOPY;  Service: Endoscopy;  Laterality: N/A;  . EYE SURGERY Right    cataract  . FINE NEEDLE ASPIRATION N/A 07/10/2013   Procedure: FINE NEEDLE ASPIRATION (FNA) LINEAR;  Surgeon: Arta Silence, MD;  Location: [facility] ENDOSCOPY;  Service: Endoscopy;  Laterality: N/A;  possible fna  . JOINT REPLACEMENT     LTKA  . KNEE SURGERY Left    x 5, total knee Left knee  . LAPAROSCOPY N/A 08/07/2013   Procedure: LAPAROSCOPY DIAGNOSTIC;  Surgeon: [name], MD;  Location: [place];  Service: General;  Laterality: N/A;  . LUMBAR SPINE SURGERY     x2  . LUMBAR SPINE SURGERY     cyst  . ORIF ANKLE FRACTURE Right 08/16/2015   Procedure: OPEN REDUCTION INTERNAL FIXATION (ORIF)  ANKLE FRACTURE;  Surgeon: Meredith Pel, MD;  Location: [place];  Service: Orthopedics;  Laterality: Right;  . RADIOACTIVE SEED GUIDED EXCISIONAL BREAST BIOPSY Left 12/15/2015   Procedure: LEFT RADIOACTIVE SEED GUIDED EXCISIONAL BREAST BIOPSY;  Surgeon: [name], MD;  Location: [place];  Service: General;  Laterality: Left;  . WHIPPLE PROCEDURE N/A 08/07/2013   Procedure: WHIPPLE PROCEDURE;  Surgeon: [name], MD;  Location: [facility] OR;  Service: General;  Laterality: N/A;    Family History  Problem Relation Age of Onset  . Cancer Mother     Breast Cancer with Metastatic disease  . Alzheimer's disease Father   . Other Brother 13    GSW    Social history:  reports that she has never smoked. She has never  used smokeless tobacco. She reports that she does not drink alcohol or use drugs.    Allergies  Allergen Reactions  . Other Other (See Comments)    According to patient Trilafor and Reglan cause Tardive Dyskinesia  . Metoclopramide Hcl Other (See Comments)    Shaking; caused tardive dyskinesia  . Niacin Other (See Comments)    shaking  . Trovan [Alatrofloxacin Mesylate] Other (See Comments)    Caused shaking and nervousness  . Benzocaine-Resorcinol Rash  . Celecoxib Other (See Comments)    unknown  . Erythromycin Base Rash  . Glucosamine Other (See Comments)    unknown  . Nortriptyline Other (See Comments)    Dizziness   . Phenazopyridine Hcl Other (See Comments)    unknown  . Sulfa Antibiotics Nausea And Vomiting and Rash  . Sulfonamide Derivatives Nausea And Vomiting and Rash    Medications:  Prior to Admission medications   Medication Sig Start Date End Date Taking? Authorizing Provider  aspirin 81 MG tablet Take 81 mg by mouth daily.   Yes Historical Provider, MD  atenolol (TENORMIN) 25 MG tablet Take 1 tablet by mouth  every morning 02/09/15  Yes Marin Olp, MD  baclofen (LIORESAL) 10 MG tablet Take 1 tablet (10 mg total) by mouth 3 (three) times daily. 08/19/15  Yes Velvet Bathe, MD  busPIRone (BUSPAR) 15 MG tablet Take 15 mg by mouth at bedtime.   Yes Historical Provider, MD  Calcium Carbonate (CALCIUM 600 PO) Take 1 tablet by mouth daily.   Yes Historical Provider, MD  carboxymethylcellulose (REFRESH TEARS) 0.5 % SOLN Place 1 drop into both eyes 2 (two) times daily.    Yes Historical Provider, MD  cholecalciferol (VITAMIN D) 1000 UNITS tablet Take 1,000 Units by mouth 2 (two) times daily.    Yes Historical Provider, MD  cyanocobalamin 2000 MCG tablet Take 2,000 mcg by mouth daily at 12 noon.    Yes Historical Provider, MD  docusate sodium (COLACE) 100 MG capsule Take 200 mg by mouth at bedtime.   Yes Historical Provider, MD  feeding supplement (BOOST HIGH PROTEIN)  LIQD Take 1 Container by mouth daily.   Yes Historical Provider, MD  FLUoxetine (PROZAC) 20 MG capsule Take 1 capsule (20 mg total) by mouth daily. 11/17/14  Yes Marin Olp, MD  furosemide (LASIX) 20 MG tablet Take 1 tablet (20 mg total) by mouth daily. 08/06/15  Yes Isaiah Serge, NP  HYDROcodone-acetaminophen (NORCO/VICODIN) 5-325 MG tablet Take 1-2 tablets by mouth every 6 (six) hours as needed for moderate pain. 12/15/15  Yes [name], MD  ibuprofen (ADVIL,MOTRIN) 200 MG tablet Take 200 mg by mouth every 6 (six) hours as needed for moderate pain.  Yes Historical Provider, MD  levothyroxine (SYNTHROID, LEVOTHROID) 175 MCG tablet Take 1 tablet by mouth  daily before breakfast 02/09/15  Yes Marin Olp, MD  lipase/protease/amylase (CREON) 12000 UNITS CPEP capsule Take 2 capsules (24,000 Units total) by mouth 3 (three) times daily before meals. Patient taking differently: Take 12,000 Units by mouth 3 (three) times daily before meals.  05/25/14  Yes Michael Boston, MD  loratadine (CLARITIN) 10 MG tablet Take 10 mg by mouth daily.   Yes Historical Provider, MD  lovastatin (MEVACOR) 40 MG tablet Take 1 tablet by mouth at  bedtime 02/09/15  Yes Marin Olp, MD  Multiple Vitamin (MULTIVITAMIN WITH MINERALS) TABS tablet Take 1 tablet by mouth daily.   Yes Historical Provider, MD  omeprazole (PRILOSEC OTC) 20 MG tablet Take 20 mg by mouth 2 (two) times daily.    Yes Historical Provider, MD  OXcarbazepine (TRILEPTAL) 150 MG tablet One tablet in the morning and 3 in the evening 12/29/15  Yes Kathrynn Ducking, MD  polycarbophil (FIBERCON) 625 MG tablet Take 625 mg by mouth daily.    Yes Historical Provider, MD  potassium chloride (K-DUR) 10 MEQ tablet Take 1 tablet (10 mEq total) by mouth daily. 08/06/15  Yes Isaiah Serge, NP  pregabalin (LYRICA) 150 MG capsule Take 1 capsule (150 mg total) by mouth 3 (three) times daily. 11/30/15  Yes Marcial Pacas, MD  promethazine (PHENERGAN) 25 MG tablet Take 25 mg  by mouth every 6 (six) hours as needed for nausea or vomiting.   Yes Historical Provider, MD  psyllium (REGULOID) 0.52 g capsule Take 0.52 g by mouth daily.   Yes Historical Provider, MD  rOPINIRole (REQUIP) 0.5 MG tablet Take 1 tablet (0.5 mg total) by mouth at bedtime. 05/11/15  Yes Dennie Bible, NP  simethicone (MYLICON) 0000000 MG chewable tablet Chew 125 mg by mouth every 6 (six) hours as needed for flatulence.   Yes Historical Provider, MD  vitamin E 400 UNIT capsule Take 800 Units by mouth 2 (two) times daily. For tartive dyskinesia   Yes Historical Provider, MD    ROS:  Out of a complete 14 system review of symptoms, the patient complains only of the following symptoms, and all other reviewed systems are negative.  Excessive sweating Hearing loss, ringing in the ears, runny nose Blurred vision Shortness of breath Leg swelling, heart murmur Rectal pain Daytime sleepiness, snoring Frequency of urination, urinary urgency Joint pain, joint swelling, back pain, muscle cramps, walking difficulty, neck pain Bruising easily Memory loss  Blood pressure 130/78, pulse 78, height 5\' 1"  (1.549 m), weight 150 lb (68 kg).  Physical Exam  General: The patient is alert and cooperative at the time of the examination.  Skin: 1+ edema of ankles is noted bilaterally.   Neurologic Exam  Mental status: The patient is alert and oriented x 3 at the time of the examination. The patient has apparent normal recent and remote memory, with an apparently normal attention span and concentration ability.   Cranial nerves: Facial symmetry is present. Speech is normal, no aphasia or dysarthria is noted. Extraocular movements are full. Visual fields are full.  Motor: The patient has good strength in all 4 extremities.  Sensory examination: Soft touch sensation is symmetric on the face, arms, and legs. There is a stocking pattern pinprick sensory deficit up to the knees bilaterally.  Coordination:  The patient has good finger-nose-finger and heel-to-shin bilaterally.  Gait and station: The patient has a slightly wide-based gait,  she walks with a cane. Tandem gait is unsteady. Romberg is negative, but is unsteady. No drift is seen.  Reflexes: Deep tendon reflexes are symmetric.   Assessment/Plan:  1. Small fiber neuropathy  The patient's having significant discomfort in the feet and legs. The patient is on Lyrica taking 150 mg 3 times daily, we will go up on the Trileptal taking 150 mg in the morning, 300 mg in the evening for one week, then go to 450 mg in the evening. A prescription was written for this medication. Eventually, the patient may go up on the Lyrica taking 300 mg at night and 150 mg twice during the day. Her psychiatrist recommended this medication change to help her sleep better. She will follow-up in 2 months for reevaluation.  Jill Alexanders MD 12/29/2015 12:53 PM  [facility] [address] [place], [place] [address]  Phone [phone] Fax [phone]

## 2015-12-31 ENCOUNTER — Encounter: Payer: Self-pay | Admitting: Physical Therapy

## 2015-12-31 ENCOUNTER — Ambulatory Visit: Payer: Medicare Other | Attending: Orthopaedic Surgery | Admitting: Physical Therapy

## 2015-12-31 DIAGNOSIS — M25562 Pain in left knee: Secondary | ICD-10-CM | POA: Insufficient documentation

## 2015-12-31 DIAGNOSIS — R600 Localized edema: Secondary | ICD-10-CM | POA: Diagnosis not present

## 2015-12-31 DIAGNOSIS — M6281 Muscle weakness (generalized): Secondary | ICD-10-CM | POA: Diagnosis not present

## 2015-12-31 NOTE — Therapy (Signed)
[facility] [address], [place], [place], [address] Phone: [phone]   Fax:  [phone]  Physical Therapy Evaluation  Patient Details  Name: [name] MRN: [id number] Date of Birth: [birth year] Referring Provider: Dr. Rodell Perna  Encounter Date: 12/31/2015      PT End of Session - 12/31/15 1118    Visit Number 1   Number of Visits 10   Date for PT Re-Evaluation 02/25/16   Authorization Type medicare 10th g-code   PT Start Time 1015   PT Stop Time 1115   PT Time Calculation (min) 60 min   Activity Tolerance Patient tolerated treatment well   Behavior During Therapy [facility] for tasks assessed/performed      Past Medical History:  Diagnosis Date  . Allergic rhinitis   . Cancer ([place]) 07/10/13   Pancreatic cancer with MRI scan 06-19-13  . Chronic maxillary sinusitis    neti pot  . Depression    alone a lot  . Eustachian tube dysfunction   . GERD (gastroesophageal reflux disease)   . Glaucoma   . Heart murmur    hx. "MVP" -predental antibiotics  . Hiatal hernia   . Hypertension   . Hypothyroid   . Memory loss    short term memory loss  . Mitral valve prolapse    antibiotics before dental procedures  . Osteoarthritis   . Stroke [facility])    mini storkes left leg paraylsis. patient denies weakness 01/08/14.   . Tardive dyskinesia    possibly reglan, vitamin E helps  . Urgency of urination    some UTI in past    Past Surgical History:  Procedure Laterality Date  . 1 baker cyst removed    . ABDOMINAL HYSTERECTOMY     including ovaries  . BACK SURGERY     fusion  . BLEPHAROPLASTY Bilateral    with cataract surgery  . BREAST SURGERY     Biopsy left 2 times  . COLONOSCOPY W/ POLYPECTOMY     2004 last colonoscopy, no polyps  . DILATION AND CURETTAGE OF UTERUS     x3  . ESOPHAGOGASTRODUODENOSCOPY N/A 09/11/2013   Procedure: ESOPHAGOGASTRODUODENOSCOPY (EGD);  Surgeon: Cleotis Nipper, MD;  Location: [facility]  ENDOSCOPY;  Service: Endoscopy;  Laterality: N/A;  Moderate sedation okay if MAC not available  . EUS N/A 07/10/2013   Procedure: ESOPHAGEAL ENDOSCOPIC ULTRASOUND (EUS) RADIAL;  Surgeon: Arta Silence, MD;  Location: [facility] ENDOSCOPY;  Service: Endoscopy;  Laterality: N/A;  . EYE SURGERY Right    cataract  . FINE NEEDLE ASPIRATION N/A 07/10/2013   Procedure: FINE NEEDLE ASPIRATION (FNA) LINEAR;  Surgeon: Arta Silence, MD;  Location: [facility] ENDOSCOPY;  Service: Endoscopy;  Laterality: N/A;  possible fna  . JOINT REPLACEMENT     LTKA  . KNEE SURGERY Left    x 5, total knee Left knee  . LAPAROSCOPY N/A 08/07/2013   Procedure: LAPAROSCOPY DIAGNOSTIC;  Surgeon: [name], MD;  Location: [place];  Service: General;  Laterality: N/A;  . LUMBAR SPINE SURGERY     x2  . LUMBAR SPINE SURGERY     cyst  . ORIF ANKLE FRACTURE Right 08/16/2015   Procedure: OPEN REDUCTION INTERNAL FIXATION (ORIF) ANKLE FRACTURE;  Surgeon: Meredith Pel, MD;  Location: [place];  Service: Orthopedics;  Laterality: Right;  . RADIOACTIVE SEED GUIDED EXCISIONAL BREAST BIOPSY Left 12/15/2015   Procedure: LEFT RADIOACTIVE SEED GUIDED EXCISIONAL BREAST BIOPSY;  Surgeon: [name], MD;  Location: [facility]  OR;  Service: General;  Laterality: Left;  . WHIPPLE PROCEDURE N/A 08/07/2013   Procedure: WHIPPLE PROCEDURE;  Surgeon: [name], MD;  Location: [place];  Service: General;  Laterality: N/A;    There were no vitals filed for this visit.       Subjective Assessment - 12/31/15 1027    Subjective Left knee started to swell one year ago.  MD took fluid off the left knee Patient reprots sudden onset of left knee since last year. Patient has had no falls in the past 6 Patient has neuropathe.    Patient Stated Goals reduce pain in left knee;    Currently in Pain? 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 Rodell Perna   Onset Date/Surgical Date 12/31/14   Prior Therapy None     Precautions   Precautions Other (comment)  cancer precautions; memeory loss     Restrictions   Weight Bearing Restrictions No     Balance Screen   Has the patient fallen in the past 6 months No   Has the patient had a decrease in activity level because of a fear of falling?  Yes  on stairs   Is the patient reluctant to leave their home because of a fear of falling?  No     Home Ecologist residence   Home Access Stairs to enter   Entrance Stairs-Number of Steps 2   [place] One level   [place] - single point     Prior Function   Level of [place] Retired     Associate Professor   Overall Cognitive Status Within Functional Limits for tasks assessed     Observation/Other Assessments   Focus on Therapeutic Outcomes (FOTO)  55% limitation  goal is 53% limitation     Observation/Other Assessments-Edema    Edema Circumferential     Circumferential Edema   Circumferential - Left  39.3 cm around knee joint; 3cm above knee joint 42cm     ROM / Strength   AROM / PROM / Strength AROM;PROM;Strength     AROM   Left Knee Extension -5  in sitting   Left Knee Flexion 125     PROM   Left Knee Extension -2  supine   Left Knee Flexion 130     Strength   Left Hip Flexion 4/5   Left Hip Extension 3+/5   Left Hip ABduction 3/5   Left Knee Flexion 4+/5   Left Knee Extension 4+/5   Left Ankle Dorsiflexion 4+/5   Left Ankle  Inversion 3+/5     Palpation   Patella mobility good   Palpation comment tenderness located in posterior left knee and lateral left knee with increased swelling     Ambulation/Gait   Ambulation/Gait  Yes   Ambulation/Gait Assistance 6: Modified independent (Device/Increase time)   Assistive device Straight cane   Gait Pattern Decreased stride length;Narrow base of support   Stairs Yes   Stairs Assistance 6: Modified independent (Device/Increase time)   Stair Management Technique Step to pattern     Berg Balance Test   Sit to Stand Able to stand  independently using hands   Standing Unsupported Able to stand safely 2 minutes   Sitting with Back Unsupported but Feet Supported on Floor or Stool Able to sit safely and securely 2 minutes   Stand to Sit Controls descent by using hands   Transfers Able to transfer safely, definite need of hands   Standing Unsupported with Eyes Closed Able to stand 10 seconds safely   Standing Ubsupported with Feet Together Able to place feet together independently and stand 1 minute safely   From Standing, Reach Forward with Outstretched Arm Can reach confidently >25 cm (10")   From Standing Position, Pick up Object from Floor Able to pick up shoe safely and easily   From Standing Position, Turn to Look Behind Over each Shoulder Looks behind from both sides and weight shifts well   Turn 360 Degrees Able to turn 360 degrees safely but slowly  6 sec   Standing Unsupported, Alternately Place Feet on Step/Stool Able to stand independently and safely and complete 8 steps in 20 seconds   Standing Unsupported, One Foot in Front Needs help to step but can hold 15 seconds   Standing on One Leg Tries to lift leg/unable to hold 3 seconds but remains standing independently  right leg 3 sec; left leg 5 sec   Total Score 45   Berg comment: 80% chance of falling                           PT Education - 12/31/15 1118    Education provided Yes    Education Details knee strengthening   Person(s) Educated Patient   Methods Explanation;Demonstration;Verbal cues;Handout   Comprehension Returned demonstration;Verbalized understanding          PT Short Term Goals - 12/31/15 1132      PT SHORT TERM GOAL #1   Title Pt will be independent with initial HEP   Time 4   Period Weeks   Status New     PT SHORT TERM GOAL #2   Title Berg Balance score >/= 48/56    Period Weeks   Status New     PT SHORT TERM GOAL #3   Title pain with walking in left leg decreased >/= 25%   Time 4   Period Weeks   Status New     PT SHORT TERM GOAL #4   Title going up and down steps at home with increased confidence >/= 25%   Time 4   Period Weeks   Status New           PT Long Term Goals - 12/31/15 1100      PT LONG TERM GOAL #1   Title Pt iindependent with advanced HEP   Time 8   Period Weeks   Status New     PT LONG TERM GOAL #2   Title Pt will improve FOTO to < = to 53%% to demo improved functional abilities   Time 8   Period Weeks   Status New     PT LONG TERM GOAL #3   Title Berg balance  score >/= 52/56 due to increased strength   Time 8   Period Weeks   Status New     PT LONG TERM GOAL #4   Title be able to square dance with a partner due to left LE strength >/= 4/5   Time 8   Period Weeks   Status New     PT LONG TERM GOAL #5   Title go up and down steps at home with increased confidence of not falling >/= 75%   Time 8   Period Weeks   Status New     Additional Long Term Goals   Additional Long Term Goals Yes     PT LONG TERM GOAL #6   Title go back to church due to being able to walk with pain decreased >/= 50%   Time 8   Period Weeks   Status New     PT LONG TERM GOAL #7   Title swelling in left knee decreased by 1 cm to reduce pain   Time 8   Period Weeks   Status New               Plan - 12/31/15 1120    Clinical Impression Statement Patient is a 75 year old female with diagnosis of left  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  Patient reports pain is intermittent at level 6/10 with increased with walking, and stairs. Patient has weakness in left hip, left knee and ankle causing her instability and fear of falling.  Patient ambulates with a single point cane with reduced speed, small steps and small base of support.  Berg balance score is 45/56 indicating 80% chance of falling.  Edema is located in left lower leg and knee especially superior to left patella. Left patella has good mobility.  Palpable tenderness located in lateral left knee and posteriorly.  Patient has not been able to go to church or square dance due to left knee pain and left leg weakness.  Patient has had a left knee replacement 10 years ago.  She had a ORIF of right ankle on 08/16/2015. Patient has difficulty with her memory.  Patient is of moderate complexity .  Patient will benefit from skilled therapy to improve left leg strength and improve balance.    Rehab Potential Excellent   Clinical Impairments Affecting Rehab Potential None   PT Frequency 2x / week   PT Duration 8 weeks   PT Treatment/Interventions Cryotherapy;Electrical Stimulation;Stair training;Gait training;Moist Heat;Therapeutic activities;Therapeutic exercise;Balance training;Neuromuscular re-education;Patient/family education;Passive range of motion;Manual techniques;Vasopneumatic Device   PT Next Visit Plan vasopnuematic device; knee, hip and ankle strength; balance exercises   PT Home Exercise Plan hip and balance ex; tips to avoid falls   Recommended Other Services compression stocking for swelling of left knee and lower leg   Consulted and Agree with Plan of Care Patient      Patient will benefit from skilled therapeutic intervention in order to improve the following deficits and impairments:  Abnormal gait, Decreased range of motion, Difficulty walking, Increased fascial restricitons, Decreased endurance,  Increased muscle spasms, Pain, Decreased activity tolerance, Decreased balance, Impaired flexibility, Decreased mobility, Decreased strength, Increased edema  Visit Diagnosis: Localized edema - Plan: PT plan of care cert/re-cert  Muscle weakness (generalized) - Plan: PT plan of care cert/re-cert  Pain in left knee - Plan: PT plan of care cert/re-cert      G-Codes - [id number]    Functional Assessment Tool Used [employer]  score 55% limitation  goal is 53% limitation   Functional Limitation Mobility: Walking and moving around   Mobility: Walking and Moving Around Current Status [phone]) At least 40 percent but less than 60 percent impaired, limited or restricted   Mobility: Walking and Moving Around Goal Status [phone]) At least 40 percent but less than 60 percent impaired, limited or restricted       Problem List Patient Active Problem List   Diagnosis Date Noted  . Abnormal radionuclide bone scan 08/16/2015  . Ankle fracture, bimalleolar, closed 08/16/2015  . Central chest pain 07/28/2015  . HLD (hyperlipidemia) 07/28/2015  . Fatty liver 09/09/2014  . Lumbago 07/07/2014  . Abnormality of gait 03/27/2014  . Memory loss 01/08/2014  . Exocrine pancreatic insufficiency ([place]) 12/30/2013  . Other abnormal glucose 11/29/2013  . Carcinoma of head of pancreas ([place]) 07/01/2013  . 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 Paresthesia of foot 06/06/2012  . Syncope 09/26/2011  . RHINOSINUSITIS, CHRONIC 02/03/2009  . COLONIC POLYPS 06/05/2008  . OSTEOARTHRITIS 06/04/2008  . DIARRHEA, CHRONIC 04/22/2008  . Low back pain 08/03/2007  . Chronic maxillary sinusitis 04/20/2007  . Hypothyroidism 02/26/2007  . HYPERLIPIDEMIA 02/26/2007  . ANXIETY 02/26/2007  . Adjustment disorder with mixed anxiety and depressed mood 02/26/2007  . TARDIVE DYSKINESIA 02/26/2007  . GLAUCOMA 02/26/2007  . EUSTACHIAN TUBE DYSFUNCTION 02/26/2007  . Essential hypertension 02/26/2007  . MITRAL  VALVE PROLAPSE 02/26/2007  . ALLERGIC RHINITIS 02/26/2007  . GERD 02/26/2007  . HIATAL HERNIA 02/26/2007  . OSTEOPOROSIS 02/26/2007    Earlie Counts, PT 12/31/15 11:36 AM   [facility] [address] [place], [place], [address] Phone: [phone]   Fax:  [phone]  Name: ELANAH PRESTON MRN: [id number] Date of Birth: 04-27-1941

## 2015-12-31 NOTE — Patient Instructions (Addendum)
Quad Set    Slowly tighten thigh muscles of straight leg while counting out loud to 10____. Relax. Repeat __10__ times. Do _2___ sessions per day.  http://gt2.exer.us/706   Copyright  VHI. All rights reserved.   Straight Leg Raise    Tighten stomach and slowly raise locked right leg _6___ inches from floor. Repeat _20___ times per set. Do _1___ sets per session. Do __2__ sessions per day.  http://orth.exer.us/1103   Copyright  VHI. All rights reserved.   Heel Slides    Squeeze pelvic floor and hold. Slide left heel along bed towards bottom. Hold for _5__ seconds. Slide back to flat knee position. Repeat _20__ times. Do _2__ times a day. Repeat with other leg.    Copyright  VHI. All rights reserved.   Strengthening: Hip Abduction (Side-Lying)    Tighten muscles on front of left thigh, then lift leg _4___ inches from surface, keeping knee locked.  Repeat __10__ times per set. Do __1__ sets per session. Do _2___ sessions per day. Keep the left hip forward and knee straight http://orth.exer.us/622   Copyright  VHI. All rights reserved.  [place] [address] [place], [place] [address] Phone # [phone] Fax [phone]

## 2016-01-04 ENCOUNTER — Ambulatory Visit: Payer: Medicare Other

## 2016-01-06 ENCOUNTER — Encounter: Payer: Self-pay | Admitting: Physical Therapy

## 2016-01-06 ENCOUNTER — Ambulatory Visit: Payer: Medicare Other | Admitting: Physical Therapy

## 2016-01-06 DIAGNOSIS — M6281 Muscle weakness (generalized): Secondary | ICD-10-CM

## 2016-01-06 DIAGNOSIS — R600 Localized edema: Secondary | ICD-10-CM | POA: Diagnosis not present

## 2016-01-06 DIAGNOSIS — M25562 Pain in left knee: Secondary | ICD-10-CM

## 2016-01-06 NOTE — Therapy (Signed)
[facility] [address] [place], [place], [address] Phone: [phone]   Fax:  [phone]  Physical Therapy Treatment  Patient Details  Name: [name] MRN: [id number] Date of Birth: Oct 24, 1940 Referring Provider: Dr. Rodell Perna  Encounter Date: 01/06/2016      PT End of Session - 01/06/16 1237    Visit Number 2   Number of Visits 10   Date for PT Re-Evaluation 02/25/16   Authorization Type medicare 10th g-code   PT Start Time 1233   PT Stop Time 1311   PT Time Calculation (min) 38 min   Activity Tolerance Patient tolerated treatment well   Behavior During Therapy [facility] for tasks assessed/performed      Past Medical History:  Diagnosis Date  . Allergic rhinitis   . Cancer ([place]) 07/10/13   Pancreatic cancer with MRI scan 06-19-13  . Chronic maxillary sinusitis    neti pot  . Depression    alone a lot  . Eustachian tube dysfunction   . GERD (gastroesophageal reflux disease)   . Glaucoma   . Heart murmur    hx. "MVP" -predental antibiotics  . Hiatal hernia   . Hypertension   . Hypothyroid   . Memory loss    short term memory loss  . Mitral valve prolapse    antibiotics before dental procedures  . Osteoarthritis   . Stroke [facility])    mini storkes left leg paraylsis. patient denies weakness 01/08/14.   . Tardive dyskinesia    possibly reglan, vitamin E helps  . Urgency of urination    some UTI in past    Past Surgical History:  Procedure Laterality Date  . 1 baker cyst removed    . ABDOMINAL HYSTERECTOMY     including ovaries  . BACK SURGERY     fusion  . BLEPHAROPLASTY Bilateral    with cataract surgery  . BREAST SURGERY     Biopsy left 2 times  . COLONOSCOPY W/ POLYPECTOMY     2004 last colonoscopy, no polyps  . DILATION AND CURETTAGE OF UTERUS     x3  . ESOPHAGOGASTRODUODENOSCOPY N/A 09/11/2013   Procedure: ESOPHAGOGASTRODUODENOSCOPY (EGD);  Surgeon: Cleotis Nipper, MD;  Location: [facility]  ENDOSCOPY;  Service: Endoscopy;  Laterality: N/A;  Moderate sedation okay if MAC not available  . EUS N/A 07/10/2013   Procedure: ESOPHAGEAL ENDOSCOPIC ULTRASOUND (EUS) RADIAL;  Surgeon: Arta Silence, MD;  Location: [facility] ENDOSCOPY;  Service: Endoscopy;  Laterality: N/A;  . EYE SURGERY Right    cataract  . FINE NEEDLE ASPIRATION N/A 07/10/2013   Procedure: FINE NEEDLE ASPIRATION (FNA) LINEAR;  Surgeon: Arta Silence, MD;  [facility]: [facility] ENDOSCOPY;  Service: Endoscopy;  Laterality: N/A;  possible fna  . JOINT REPLACEMENT     LTKA  . KNEE SURGERY Left    x 5, total knee Left knee  . LAPAROSCOPY N/A 08/07/2013   Procedure: LAPAROSCOPY DIAGNOSTIC;  Surgeon: [name], MD;  Location: [place];  Service: General;  Laterality: N/A;  . LUMBAR SPINE SURGERY     x2  . LUMBAR SPINE SURGERY     cyst  . ORIF ANKLE FRACTURE Right 08/16/2015   Procedure: OPEN REDUCTION INTERNAL FIXATION (ORIF) ANKLE FRACTURE;  Surgeon: Meredith Pel, MD;  Location: [place];  Service: Orthopedics;  Laterality: Right;  . RADIOACTIVE SEED GUIDED EXCISIONAL BREAST BIOPSY Left 12/15/2015   Procedure: LEFT RADIOACTIVE SEED GUIDED EXCISIONAL BREAST BIOPSY;  Surgeon: [name], MD;  Location: [facility]  OR;  Service: General;  Laterality: Left;  . WHIPPLE PROCEDURE N/A 08/07/2013   Procedure: WHIPPLE PROCEDURE;  Surgeon: [name], MD;  Location: [place];  Service: General;  Laterality: N/A;    There were no vitals filed for this visit.      Subjective Assessment - 01/06/16 1235    Subjective I had a flare up of my left knee.  I was unable to do my exercises.    Patient Stated Goals reduce pain in left knee;    Currently in Pain? Yes   Pain Score 5    Pain Location Knee   Pain Orientation Left;Lateral   Pain Descriptors / Indicators Dull   Pain Type Chronic pain   Pain Frequency Constant   Aggravating Factors  walking, exercise   Pain Relieving Factors rest   Multiple Pain Sites No                          [facility] Adult PT Treatment/Exercise - 01/06/16 0001      Exercises   Exercises Knee/Hip     Knee/Hip Exercises: Supine   Quad Sets Strengthening;Left;1 set;10 reps  hold 10 sec, VC to relax with control   Short Arc Quad Sets Left;Strengthening;3 sets;10 reps   Short Arc Quad Sets Limitations no weight   Straight Leg Raises Left;Strengthening;2 sets;10 reps   Knee Extension Strengthening;Left;3 sets;10 reps  with ball squeeze     Modalities   Modalities Vasopneumatic     Vasopneumatic   Number Minutes Vasopneumatic  15 minutes   Vasopnuematic Location  Knee  left   Vasopneumatic Pressure Medium   Vasopneumatic Temperature  3 snowflakes                  PT Short Term Goals - 01/06/16 1304      PT SHORT TERM GOAL #1   Title Pt will be independent with initial HEP   Baseline met on 07/23/14   Time 4   Period Weeks   Status On-going     PT SHORT TERM GOAL #2   Title Berg Balance score >/= 48/56    Time 4   Period Weeks   Status On-going     PT SHORT TERM GOAL #3   Title pain with walking in left leg decreased >/= 25%   Time 4   Period Weeks   Status On-going     PT SHORT TERM GOAL #4   Title going up and down steps at home with increased confidence >/= 25%   Time 4   Period Weeks           PT Long Term Goals - 12/31/15 1100      PT LONG TERM GOAL #1   Title Pt iindependent with advanced HEP   Time 8   Period Weeks   Status New     PT LONG TERM GOAL #2   Title Pt will improve FOTO to < = to 53%% to demo improved functional abilities   Time 8   Period Weeks   Status New     PT LONG TERM GOAL #3   Title Berg balance score >/= 52/56 due to increased strength   Time 8   Period Weeks   Status New     PT LONG TERM GOAL #4   Title be able to square dance with a partner due to left LE strength >/= 4/5   Time 8   Period Weeks  Status New     PT LONG TERM GOAL #5   Title go up and down steps at home with increased confidence of not falling  >/= 75%   Time 8   Period Weeks   Status New     Additional Long Term Goals   Additional Long Term Goals Yes     PT LONG TERM GOAL #6   Title go back to church due to being able to walk with pain decreased >/= 50%   Time 8   Period Weeks   Status New     PT LONG TERM GOAL #7   Title swelling in left knee decreased by 1 cm to reduce pain   Time 8   Period Weeks   Status New               Plan - 01/06/16 1304    Clinical Impression Statement Patient has increased pain therefore did not do as much exercise.  Patient was able to do exercise without increased pain.  Patient has been doing her exercises but when she had increased pain she stopped.  Patient will benefit from skilled therapy  to  increase left knee strength and decrease pain.    Rehab Potential Excellent   Clinical Impairments Affecting Rehab Potential None   PT Frequency 2x / week   PT Duration 8 weeks   PT Treatment/Interventions Cryotherapy;Electrical Stimulation;Stair training;Gait training;Moist Heat;Therapeutic activities;Therapeutic exercise;Balance training;Neuromuscular re-education;Patient/family education;Passive range of motion;Manual techniques;Vasopneumatic Device   PT Next Visit Plan vasopnuematic device; knee, hip and ankle strength; balance exercises   PT Home Exercise Plan hip and balance ex; tips to avoid falls   Consulted and Agree with Plan of Care Patient      Patient will benefit from skilled therapeutic intervention in order to improve the following deficits and impairments:  Abnormal gait, Decreased range of motion, Difficulty walking, Increased fascial restricitons, Decreased endurance, Increased muscle spasms, Pain, Decreased activity tolerance, Decreased balance, Impaired flexibility, Decreased mobility, Decreased strength, Increased edema  Visit Diagnosis: Localized edema  Muscle weakness (generalized)  Pain in left knee     Problem List Patient Active Problem List    Diagnosis Date Noted  . Abnormal radionuclide bone scan 08/16/2015  . Ankle fracture, bimalleolar, closed 08/16/2015  . Central chest pain 07/28/2015  . HLD (hyperlipidemia) 07/28/2015  . Fatty liver 09/09/2014  . Lumbago 07/07/2014  . Abnormality of gait 03/27/2014  . Memory loss 01/08/2014  . Exocrine pancreatic insufficiency ([place]) 12/30/2013  . Other abnormal glucose 11/29/2013  . Carcinoma of head of pancreas ([place]) 07/01/2013  . 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 Paresthesia of foot 06/06/2012  . Syncope 09/26/2011  . RHINOSINUSITIS, CHRONIC 02/03/2009  . COLONIC POLYPS 06/05/2008  . OSTEOARTHRITIS 06/04/2008  . DIARRHEA, CHRONIC 04/22/2008  . Low back pain 08/03/2007  . Chronic maxillary sinusitis 04/20/2007  . Hypothyroidism 02/26/2007  . HYPERLIPIDEMIA 02/26/2007  . ANXIETY 02/26/2007  . Adjustment disorder with mixed anxiety and depressed mood 02/26/2007  . TARDIVE DYSKINESIA 02/26/2007  . GLAUCOMA 02/26/2007  . EUSTACHIAN TUBE DYSFUNCTION 02/26/2007  . Essential hypertension 02/26/2007  . MITRAL VALVE PROLAPSE 02/26/2007  . ALLERGIC RHINITIS 02/26/2007  . GERD 02/26/2007  . HIATAL HERNIA 02/26/2007  . OSTEOPOROSIS 02/26/2007    Earlie Counts, PT 01/06/16 1:14 PM   [facility] [address], [place], [place], [address] Phone: [phone]   Fax:  [phone]  Name: TAMEIA RAFFERTY MRN: [id number] Date of  Birth: [birth year]

## 2016-01-11 ENCOUNTER — Ambulatory Visit: Payer: Medicare Other | Admitting: Physical Therapy

## 2016-01-11 DIAGNOSIS — R600 Localized edema: Secondary | ICD-10-CM | POA: Diagnosis not present

## 2016-01-11 DIAGNOSIS — M6281 Muscle weakness (generalized): Secondary | ICD-10-CM

## 2016-01-11 DIAGNOSIS — M25562 Pain in left knee: Secondary | ICD-10-CM | POA: Diagnosis not present

## 2016-01-11 NOTE — Therapy (Signed)
[facility] [address], [place], [place], [address] Phone: [phone]   Fax:  [phone]  Physical Therapy Treatment  Patient Details  Name: [name] MRN: [id number] Date of Birth: 07-22-1940 Referring Provider: Dr. Rodell Perna  Encounter Date: 01/11/2016      PT End of Session - 01/11/16 1129    Visit Number 3   Number of Visits 10   Date for PT Re-Evaluation 02/25/16   Authorization Type medicare 10th g-code   PT Start Time 1057   PT Stop Time 1145   PT Time Calculation (min) 48 min   Activity Tolerance Patient tolerated treatment well   Behavior During Therapy [facility] for tasks assessed/performed      Past Medical History:  Diagnosis Date  . Allergic rhinitis   . Cancer ([place]) 07/10/13   Pancreatic cancer with MRI scan 06-19-13  . Chronic maxillary sinusitis    neti pot  . Depression    alone a lot  . Eustachian tube dysfunction   . GERD (gastroesophageal reflux disease)   . Glaucoma   . Heart murmur    hx. "MVP" -predental antibiotics  . Hiatal hernia   . Hypertension   . Hypothyroid   . Memory loss    short term memory loss  . Mitral valve prolapse    antibiotics before dental procedures  . Osteoarthritis   . Stroke [facility])    mini storkes left leg paraylsis. patient denies weakness 01/08/14.   . Tardive dyskinesia    possibly reglan, vitamin E helps  . Urgency of urination    some UTI in past    Past Surgical History:  Procedure Laterality Date  . 1 baker cyst removed    . ABDOMINAL HYSTERECTOMY     including ovaries  . BACK SURGERY     fusion  . BLEPHAROPLASTY Bilateral    with cataract surgery  . BREAST SURGERY     Biopsy left 2 times  . COLONOSCOPY W/ POLYPECTOMY     2004 last colonoscopy, no polyps  . DILATION AND CURETTAGE OF UTERUS     x3  . ESOPHAGOGASTRODUODENOSCOPY N/A 09/11/2013   Procedure: ESOPHAGOGASTRODUODENOSCOPY (EGD);  Surgeon: Cleotis Nipper, MD;  Location: [facility]  ENDOSCOPY;  Service: Endoscopy;  Laterality: N/A;  Moderate sedation okay if MAC not available  . EUS N/A 07/10/2013   Procedure: ESOPHAGEAL ENDOSCOPIC ULTRASOUND (EUS) RADIAL;  Surgeon: Arta Silence, MD;  [facility];  Service: Endoscopy;  Laterality: N/A;  . EYE SURGERY Right    cataract  . FINE NEEDLE ASPIRATION N/A 07/10/2013   Procedure: FINE NEEDLE ASPIRATION (FNA) LINEAR;  Surgeon: Arta Silence, MD;  [facility] ENDOS[facility];  Service: Endoscopy;  Laterality: N/A;  possible fna  . JOINT REPLACEMENT     LTKA  . KNEE SURGERY Left    x 5, total knee Left knee  . LAPAROSCOPY N/A 08/07/2013   Procedure: LAPAROSCOPY DIAGNOSTIC;  Surgeon: [name], MD;  Location: [place];  Service: General;  Laterality: N/A;  . LUMBAR SPINE SURGERY     x2  . LUMBAR SPINE SURGERY     cyst  . ORIF ANKLE FRACTURE Right 08/16/2015   Procedure: OPEN REDUCTION INTERNAL FIXATION (ORIF) ANKLE FRACTURE;  Surgeon: Meredith Pel, MD;  Location: [place];  Service: Orthopedics;  Laterality: Right;  . RADIOACTIVE SEED GUIDED EXCISIONAL BREAST BIOPSY Left 12/15/2015   Procedure: LEFT RADIOACTIVE SEED GUIDED EXCISIONAL BREAST BIOPSY;  Surgeon: [name], MD;  Location: [facility]  OR;  Service: General;  Laterality: Left;  . WHIPPLE PROCEDURE N/A 08/07/2013   Procedure: WHIPPLE PROCEDURE;  Surgeon: [name], MD;  Location: [place];  Service: General;  Laterality: N/A;    There were no vitals filed for this visit.      Subjective Assessment - 01/11/16 1114    Subjective My knee flared up over the weekend, but I decided to still try to come today   Currently in Pain? Yes   Pain Score 6    Pain Location Knee   Pain Orientation Left   Pain Descriptors / Indicators Aching   Pain Type Chronic pain                         [facility] Adult PT Treatment/Exercise - 01/11/16 0001      Knee/Hip Exercises: Aerobic   Stationary Bike 6 mins level 1     Knee/Hip Exercises: Seated   Long Arc Quad  Strengthening;Left;2 sets;10 reps  3 sec hold   Abduction/Adduction  15 reps  ball squeeze     Knee/Hip Exercises: Supine   Quad Sets Strengthening;Left;2 sets;10 reps  5 sec hold, cues for breathing   Short Arc Quad Sets Strengthening;Left;2 sets;10 reps   Straight Leg Raises Strengthening;Left;2 sets;10 reps     Knee/Hip Exercises: Sidelying   Hip ABduction Strengthening;Left;10 reps  cues for form     Knee/Hip Exercises: Prone   Hamstring Curl 10 reps   Hip Extension Strengthening;Left;10 reps                  PT Short Term Goals - 01/11/16 1131      PT SHORT TERM GOAL #1   Title Pt will be independent with initial HEP   Status On-going     PT SHORT TERM GOAL #2   Title Berg Balance score >/= 48/56    Status On-going     PT SHORT TERM GOAL #3   Title pain with walking in left leg decreased >/= 25%   Status On-going     PT SHORT TERM GOAL #4   Title going up and down steps at home with increased confidence >/= 25%   Status On-going           PT Long Term Goals - 01/11/16 1131      PT LONG TERM GOAL #1   Title Pt iindependent with advanced HEP   Status On-going     PT LONG TERM GOAL #2   Title Pt will improve FOTO to < = to 53%% to demo improved functional abilities   Status On-going     PT LONG TERM GOAL #3   Title Berg balance score >/= 52/56 due to increased strength   Status On-going     PT LONG TERM GOAL #4   Title be able to square dance with a partner due to left LE strength >/= 4/5   Status On-going     PT LONG TERM GOAL #5   Title go up and down steps at home with increased confidence of not falling >/= 75%   Status On-going     PT LONG TERM GOAL #6   Title go back to church due to being able to walk with pain decreased >/= 50%   Status On-going     PT LONG TERM GOAL #7   Title swelling in left knee decreased by 1 cm to reduce pain   Status On-going  Plan - 01/11/16 1129    Clinical Impression  Statement Pt able to tolerate additional hip and knee exercises. significant hip and knee weakness noted in sidelying and prone exercises pt will continue to benefit from skilled PT to improve strength and decrease pain   Rehab Potential Excellent   PT Frequency 2x / week   PT Duration 8 weeks   PT Treatment/Interventions Cryotherapy;Electrical Stimulation;Stair training;Gait training;Moist Heat;Therapeutic activities;Therapeutic exercise;Balance training;Neuromuscular re-education;Patient/family education;Passive range of motion;Manual techniques;Vasopneumatic Device   PT Next Visit Plan continue strength, begin balance exercises   Consulted and Agree with Plan of Care Patient      Patient will benefit from skilled therapeutic intervention in order to improve the following deficits and impairments:  Abnormal gait, Decreased range of motion, Difficulty walking, Increased fascial restricitons, Decreased endurance, Increased muscle spasms, Pain, Decreased activity tolerance, Decreased balance, Impaired flexibility, Decreased mobility, Decreased strength, Increased edema  Visit Diagnosis: Localized edema  Muscle weakness (generalized)  Pain in left knee     Problem List Patient Active Problem List   Diagnosis Date Noted  . Abnormal radionuclide bone scan 08/16/2015  . Ankle fracture, bimalleolar, closed 08/16/2015  . Central chest pain 07/28/2015  . HLD (hyperlipidemia) 07/28/2015  . Fatty liver 09/09/2014  . Lumbago 07/07/2014  . Abnormality of gait 03/27/2014  . Memory loss 01/08/2014  . Exocrine pancreatic insufficiency ([place]) 12/30/2013  . Other abnormal glucose 11/29/2013  . Carcinoma of head of pancreas ([place]) 07/01/2013  . 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 Paresthesia of foot 06/06/2012  . Syncope 09/26/2011  . RHINOSINUSITIS, CHRONIC 02/03/2009  . COLONIC POLYPS 06/05/2008  . OSTEOARTHRITIS 06/04/2008  . DIARRHEA, CHRONIC 04/22/2008  . Low  back pain 08/03/2007  . Chronic maxillary sinusitis 04/20/2007  . Hypothyroidism 02/26/2007  . HYPERLIPIDEMIA 02/26/2007  . ANXIETY 02/26/2007  . Adjustment disorder with mixed anxiety and depressed mood 02/26/2007  . TARDIVE DYSKINESIA 02/26/2007  . GLAUCOMA 02/26/2007  . EUSTACHIAN TUBE DYSFUNCTION 02/26/2007  . Essential hypertension 02/26/2007  . MITRAL VALVE PROLAPSE 02/26/2007  . ALLERGIC RHINITIS 02/26/2007  . GERD 02/26/2007  . HIATAL HERNIA 02/26/2007  . OSTEOPOROSIS 02/26/2007    Isabelle Course, PT, DPT 01/11/2016, 11:33 AM  [facility] [address] [place], [place], [address] Phone: [phone]   Fax:  [phone]  Name: [name] MRN: [id number] Date of Birth: 28-Nov-1940

## 2016-01-13 ENCOUNTER — Ambulatory Visit: Payer: Medicare Other | Admitting: Neurology

## 2016-01-13 ENCOUNTER — Ambulatory Visit: Payer: Medicare Other

## 2016-01-13 DIAGNOSIS — M25562 Pain in left knee: Secondary | ICD-10-CM

## 2016-01-13 DIAGNOSIS — R600 Localized edema: Secondary | ICD-10-CM | POA: Diagnosis not present

## 2016-01-13 DIAGNOSIS — M6281 Muscle weakness (generalized): Secondary | ICD-10-CM | POA: Diagnosis not present

## 2016-01-13 NOTE — Therapy (Addendum)
[facility] [address] [place], [place], [address] Phone: [phone]   Fax:  [phone]  Physical Therapy Treatment  Patient Details  Name: [name] MRN: [id number] Date of Birth: August 17, 1940 Referring Provider: Dr. Rodell Perna  Encounter Date: 01/13/2016      PT End of Session - 01/13/16 1249    Visit Number 4   Number of Visits 10   Date for PT Re-Evaluation 02/25/16   Authorization Type medicare 10th g-code   PT Start Time 1213   PT Stop Time 1305   PT Time Calculation (min) 52 min   Activity Tolerance Patient tolerated treatment well   Behavior During Therapy [facility] for tasks assessed/performed      Past Medical History:  Diagnosis Date  . Allergic rhinitis   . Cancer ([place]) 07/10/13   Pancreatic cancer with MRI scan 06-19-13  . Chronic maxillary sinusitis    neti pot  . Depression    alone a lot  . Eustachian tube dysfunction   . GERD (gastroesophageal reflux disease)   . Glaucoma   . Heart murmur    hx. "MVP" -predental antibiotics  . Hiatal hernia   . Hypertension   . Hypothyroid   . Memory loss    short term memory loss  . Mitral valve prolapse    antibiotics before dental procedures  . Osteoarthritis   . Stroke [facility])    mini storkes left leg paraylsis. patient denies weakness 01/08/14.   . Tardive dyskinesia    possibly reglan, vitamin E helps  . Urgency of urination    some UTI in past    Past Surgical History:  Procedure Laterality Date  . 1 baker cyst removed    . ABDOMINAL HYSTERECTOMY     including ovaries  . BACK SURGERY     fusion  . BLEPHAROPLASTY Bilateral    with cataract surgery  . BREAST SURGERY     Biopsy left 2 times  . COLONOSCOPY W/ POLYPECTOMY     2004 last colonoscopy, no polyps  . DILATION AND CURETTAGE OF UTERUS     x3  . ESOPHAGOGASTRODUODENOSCOPY N/A 09/11/2013   Procedure: ESOPHAGOGASTRODUODENOSCOPY (EGD);  Surgeon: Cleotis Nipper, MD;  Location: [facility]  ENDOSCOPY;  Service: Endoscopy;  Laterality: N/A;  Moderate sedation okay if MAC not available  . EUS N/A 07/10/2013   Procedure: ESOPHAGEAL ENDOSCOPIC ULTRASOUND (EUS) RADIAL;  Surgeon: Arta Silence, MD;  [facility]: [facility] ENDOSCOPY;  Service: Endoscopy;  Laterality: N/A;  . EYE SURGERY Right    cataract  . FINE NEEDLE ASPIRATION N/A 07/10/2013   Procedure: FINE NEEDLE ASPIRATION (FNA) LINEAR;  Surgeon: Arta Silence, MD;  [facility] ENDOSCOPY;  Service: Endoscopy;  Laterality: N/A;  possible fna  . JOINT REPLACEMENT     LTKA  . KNEE SURGERY Left    x 5, total knee Left knee  . LAPAROSCOPY N/A 08/07/2013   Procedure: LAPAROSCOPY DIAGNOSTIC;  Surgeon: [name], MD;  Location: [place];  Service: General;  Laterality: N/A;  . LUMBAR SPINE SURGERY     x2  . LUMBAR SPINE SURGERY     cyst  . ORIF ANKLE FRACTURE Right 08/16/2015   Procedure: OPEN REDUCTION INTERNAL FIXATION (ORIF) ANKLE FRACTURE;  Surgeon: Meredith Pel, MD;  Location: [place];  Service: Orthopedics;  Laterality: Right;  . RADIOACTIVE SEED GUIDED EXCISIONAL BREAST BIOPSY Left 12/15/2015   Procedure: LEFT RADIOACTIVE SEED GUIDED EXCISIONAL BREAST BIOPSY;  Surgeon: [name], MD;  Location: [facility]  OR;  Service: General;  Laterality: Left;  . WHIPPLE PROCEDURE N/A 08/07/2013   Procedure: WHIPPLE PROCEDURE;  Surgeon: [name], MD;  Location: [place];  Service: General;  Laterality: N/A;    There were no vitals filed for this visit.      Subjective Assessment - 01/13/16 1222    Subjective I'm tired today.     Currently in Pain? Yes   Pain Score 3    Pain Location Knee   Pain Orientation Left   Pain Descriptors / Indicators Aching   Pain Type Chronic pain   Pain Onset More than a month ago   Pain Frequency Constant   Aggravating Factors  walking, riding bike at home   Pain Relieving Factors rest, pain medication                         [facility] Adult PT Treatment/Exercise - 01/13/16 0001      Knee/Hip  Exercises: Stretches   Active Hamstring Stretch Left;3 reps;20 seconds     Knee/Hip Exercises: Aerobic   Stationary Bike 6 mins level 1     Knee/Hip Exercises: Standing   Rocker Board 3 minutes   Rebounder weight shifting 3 ways 1 x minute each     Knee/Hip Exercises: Seated   Long Arc Quad Strengthening;Left;2 sets;10 reps  3 sec hold   Hamstring Curl Strengthening;Both;2 sets;10 reps   Hamstring Limitations yellow theraband   Abduction/Adduction  15 reps  ball squeeze     Knee/Hip Exercises: Supine   Bridges Limitations 2x10  fatigue   Straight Leg Raises Strengthening;Left;2 sets;10 reps   Knee Extension Strengthening;Left;3 sets;10 reps  with ball squeeze     Vasopneumatic   Number Minutes Vasopneumatic  15 minutes   Vasopnuematic Location  Knee   Vasopneumatic Pressure Medium   Vasopneumatic Temperature  3 snowflakes                  PT Short Term Goals - 01/11/16 1131      PT SHORT TERM GOAL #1   Title Pt will be independent with initial HEP   Status On-going     PT SHORT TERM GOAL #2   Title Berg Balance score >/= 48/56    Status On-going     PT SHORT TERM GOAL #3   Title pain with walking in left leg decreased >/= 25%   Status On-going     PT SHORT TERM GOAL #4   Title going up and down steps at home with increased confidence >/= 25%   Status On-going           PT Long Term Goals - 01/11/16 1131      PT LONG TERM GOAL #1   Title Pt iindependent with advanced HEP   Status On-going     PT LONG TERM GOAL #2   Title Pt will improve FOTO to < = to 53%% to demo improved functional abilities   Status On-going     PT LONG TERM GOAL #3   Title Berg balance score >/= 52/56 due to increased strength   Status On-going     PT LONG TERM GOAL #4   Title be able to square dance with a partner due to left LE strength >/= 4/5   Status On-going     PT LONG TERM GOAL #5   Title go up and down steps at home with increased confidence of not  falling >/= 75%  Status On-going     PT LONG TERM GOAL #6   Title go back to church due to being able to walk with pain decreased >/= 50%   Status On-going     PT LONG TERM GOAL #7   Title swelling in left knee decreased by 1 cm to reduce pain   Status On-going               Plan - 01/13/16 1224    Clinical Impression Statement Pt remains weak in the Lt hip and knee.  Pt able to tolerate additional exercise in the clinic today.  Pt with Lt knee pain and edema with weightbearing exercise.  Pt will continue to benefit from skilled PT for Lt knee strength, endurance, flexibility and pain management.     Rehab Potential Excellent   PT Frequency 2x / week   PT Duration 8 weeks   PT Treatment/Interventions Cryotherapy;Electrical Stimulation;Stair training;Gait training;Moist Heat;Therapeutic activities;Therapeutic exercise;Balance training;Neuromuscular re-education;Patient/family education;Passive range of motion;Manual techniques;Vasopneumatic Device   PT Next Visit Plan continue strength, balance exercises   Consulted and Agree with Plan of Care Patient      Patient will benefit from skilled therapeutic intervention in order to improve the following deficits and impairments:     Visit Diagnosis: Muscle weakness (generalized)  Pain in left knee     Problem List Patient Active Problem List   Diagnosis Date Noted  . Abnormal radionuclide bone scan 08/16/2015  . Ankle fracture, bimalleolar, closed 08/16/2015  . Central chest pain 07/28/2015  . HLD (hyperlipidemia) 07/28/2015  . Fatty liver 09/09/2014  . Lumbago 07/07/2014  . Abnormality of gait 03/27/2014  . Memory loss 01/08/2014  . Exocrine pancreatic insufficiency ([place]) 12/30/2013  . Other abnormal glucose 11/29/2013  . Carcinoma of head of pancreas ([place]) 07/01/2013  . 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 Paresthesia of foot 06/06/2012  . Syncope 09/26/2011  . RHINOSINUSITIS, CHRONIC  02/03/2009  . COLONIC POLYPS 06/05/2008  . OSTEOARTHRITIS 06/04/2008  . DIARRHEA, CHRONIC 04/22/2008  . Low back pain 08/03/2007  . Chronic maxillary sinusitis 04/20/2007  . Hypothyroidism 02/26/2007  . HYPERLIPIDEMIA 02/26/2007  . ANXIETY 02/26/2007  . Adjustment disorder with mixed anxiety and depressed mood 02/26/2007  . TARDIVE DYSKINESIA 02/26/2007  . GLAUCOMA 02/26/2007  . EUSTACHIAN TUBE DYSFUNCTION 02/26/2007  . Essential hypertension 02/26/2007  . MITRAL VALVE PROLAPSE 02/26/2007  . ALLERGIC RHINITIS 02/26/2007  . GERD 02/26/2007  . HIATAL HERNIA 02/26/2007  . OSTEOPOROSIS 02/26/2007    Sigurd Sos, PT 01/13/16 12:52 PM  [facility] [address], [place], [place], [address] Phone: [phone]   Fax:  [phone]  Name: [name] MRN: [id number] Date of Birth: 1940-09-24

## 2016-01-18 ENCOUNTER — Encounter: Payer: Self-pay | Admitting: Physical Therapy

## 2016-01-18 ENCOUNTER — Ambulatory Visit: Payer: Medicare Other | Admitting: Physical Therapy

## 2016-01-18 DIAGNOSIS — M6281 Muscle weakness (generalized): Secondary | ICD-10-CM | POA: Diagnosis not present

## 2016-01-18 DIAGNOSIS — M25562 Pain in left knee: Secondary | ICD-10-CM | POA: Diagnosis not present

## 2016-01-18 DIAGNOSIS — R600 Localized edema: Secondary | ICD-10-CM

## 2016-01-18 NOTE — Therapy (Signed)
[facility] [address], [place], [place], [address] Phone: [phone]   Fax:  [phone]  Physical Therapy Treatment  Patient Details  Name: [name] MRN: [id number] Date of Birth: Sep 05, 1940 Referring Provider: Dr. Rodell Perna  Encounter Date: 01/18/2016      PT End of Session - 01/18/16 1127    Visit Number 5   Number of Visits 10   Date for PT Re-Evaluation 02/25/16   Authorization Type medicare 10th g-code   PT Start Time 1055   PT Stop Time 1143   PT Time Calculation (min) 48 min   Activity Tolerance Patient tolerated treatment well   Behavior During Therapy [facility] for tasks assessed/performed      Past Medical History:  Diagnosis Date  . Allergic rhinitis   . Cancer ([place]) 07/10/13   Pancreatic cancer with MRI scan 06-19-13  . Chronic maxillary sinusitis    neti pot  . Depression    alone a lot  . Eustachian tube dysfunction   . GERD (gastroesophageal reflux disease)   . Glaucoma   . Heart murmur    hx. "MVP" -predental antibiotics  . Hiatal hernia   . Hypertension   . Hypothyroid   . Memory loss    short term memory loss  . Mitral valve prolapse    antibiotics before dental procedures  . Osteoarthritis   . Stroke [facility])    mini storkes left leg paraylsis. patient denies weakness 01/08/14.   . Tardive dyskinesia    possibly reglan, vitamin E helps  . Urgency of urination    some UTI in past    Past Surgical History:  Procedure Laterality Date  . 1 baker cyst removed    . ABDOMINAL HYSTERECTOMY     including ovaries  . BACK SURGERY     fusion  . BLEPHAROPLASTY Bilateral    with cataract surgery  . BREAST SURGERY     Biopsy left 2 times  . COLONOSCOPY W/ POLYPECTOMY     2004 last colonoscopy, no polyps  . DILATION AND CURETTAGE OF UTERUS     x3  . ESOPHAGOGASTRODUODENOSCOPY N/A 09/11/2013   Procedure: ESOPHAGOGASTRODUODENOSCOPY (EGD);  Surgeon: Cleotis Nipper, MD;  Location: [facility]  ENDOSCOPY;  Service: Endoscopy;  Laterality: N/A;  Moderate sedation okay if MAC not available  . EUS N/A 07/10/2013   Procedure: ESOPHAGEAL ENDOSCOPIC ULTRASOUND (EUS) RADIAL;  Surgeon: Arta Silence, MD;  [facility];  Service: Endoscopy;  Laterality: N/A;  . EYE SURGERY Right    cataract  . FINE NEEDLE ASPIRATION N/A 07/10/2013   Procedure: FINE NEEDLE ASPIRATION (FNA) LINEAR;  Surgeon: Arta Silence, MD;  [facility];  Service: Endoscopy;  Laterality: N/A;  possible fna  . JOINT REPLACEMENT     LTKA  . KNEE SURGERY Left    x 5, total knee Left knee  . LAPAROSCOPY N/A 08/07/2013   Procedure: LAPAROSCOPY DIAGNOSTIC;  Surgeon: [name], MD;  Location: [place];  Service: General;  Laterality: N/A;  . LUMBAR SPINE SURGERY     x2  . LUMBAR SPINE SURGERY     cyst  . ORIF ANKLE FRACTURE Right 08/16/2015   Procedure: OPEN REDUCTION INTERNAL FIXATION (ORIF) ANKLE FRACTURE;  Surgeon: Meredith Pel, MD;  Location: [place];  Service: Orthopedics;  Laterality: Right;  . RADIOACTIVE SEED GUIDED EXCISIONAL BREAST BIOPSY Left 12/15/2015   Procedure: LEFT RADIOACTIVE SEED GUIDED EXCISIONAL BREAST BIOPSY;  Surgeon: [name], MD;  Location[facility]  OR;  Service: General;  Laterality: Left;  . WHIPPLE PROCEDURE N/A 08/07/2013   Procedure: WHIPPLE PROCEDURE;  Surgeon: [name], MD;  Location: [place];  Service: General;  Laterality: N/A;    There were no vitals filed for this visit.      Subjective Assessment - 01/18/16 1056    Subjective I'm sore, I did grocery shopping so I had to walk a lot   Patient Stated Goals reduce pain in left knee;    Currently in Pain? Yes   Pain Score 6    Pain Location Knee   Pain Orientation Left   Pain Descriptors / Indicators Aching   Pain Onset More than a month ago                         [facility] Adult PT Treatment/Exercise - 01/18/16 0001      Knee/Hip Exercises: Aerobic   Stationary Bike 6 mins level 2     Knee/Hip  Exercises: Standing   Rebounder wt shifts 1 min each direction     Knee/Hip Exercises: Seated   Long Arc Quad Strengthening;Left;2 sets;10 reps   Hamstring Curl Strengthening;Both;2 sets;10 reps   Hamstring Limitations yellow t band   Abduction/Adduction  15 reps  ball squeeze add, yellow t band abd     Knee/Hip Exercises: Supine   Bridges Limitations 2 x 10   Straight Leg Raises Strengthening;Left;2 sets;10 reps     Knee/Hip Exercises: Sidelying   Hip ABduction Strengthening;Left;2 sets;10 reps   Clams 2 x 10     Vasopneumatic   Number Minutes Vasopneumatic  15 minutes   Vasopnuematic Location  Knee   Vasopneumatic Pressure Medium   Vasopneumatic Temperature  3 flakes                  PT Short Term Goals - 01/18/16 1057      PT SHORT TERM GOAL #1   Title Pt will be independent with initial HEP   Status On-going     PT SHORT TERM GOAL #2   Title Berg Balance score >/= 48/56    Status On-going     PT SHORT TERM GOAL #3   Title pain with walking in left leg decreased >/= 25%   Status On-going     PT SHORT TERM GOAL #4   Title going up and down steps at home with increased confidence >/= 25%   Status Achieved           PT Long Term Goals - 01/18/16 1058      PT LONG TERM GOAL #1   Title Pt iindependent with advanced HEP   Status On-going     PT LONG TERM GOAL #2   Title Pt will improve FOTO to < = to 53%% to demo improved functional abilities   Status On-going     PT LONG TERM GOAL #3   Title Berg balance score >/= 52/56 due to increased strength   Status On-going     PT LONG TERM GOAL #4   Title be able to square dance with a partner due to left LE strength >/= 4/5   Status On-going     PT LONG TERM GOAL #5   Title go up and down steps at home with increased confidence of not falling >/= 75%   Status On-going     PT LONG TERM GOAL #6   Title go back to church due to being able to  walk with pain decreased >/= 50%   Status On-going      PT LONG TERM GOAL #7   Title swelling in left knee decreased by 1 cm to reduce pain   Status On-going               Plan - 01/18/16 1128    Clinical Impression Statement Pt continues with weakness and pain in Lt hip and knee. Able to tolerate addition of hip exercises, will continue to benefit to reduce pain and edema and improve strength and balance   Rehab Potential Excellent   PT Frequency 2x / week   PT Duration 8 weeks   PT Treatment/Interventions Cryotherapy;Electrical Stimulation;Stair training;Gait training;Moist Heat;Therapeutic activities;Therapeutic exercise;Balance training;Neuromuscular re-education;Patient/family education;Passive range of motion;Manual techniques;Vasopneumatic Device   PT Next Visit Plan progress balance exercises   Consulted and Agree with Plan of Care Patient      Patient will benefit from skilled therapeutic intervention in order to improve the following deficits and impairments:  Abnormal gait, Decreased range of motion, Difficulty walking, Increased fascial restricitons, Decreased endurance, Increased muscle spasms, Pain, Decreased activity tolerance, Decreased balance, Impaired flexibility, Decreased mobility, Decreased strength, Increased edema  Visit Diagnosis: Muscle weakness (generalized)  Pain in left knee  Localized edema     Problem List Patient Active Problem List   Diagnosis Date Noted  . Abnormal radionuclide bone scan 08/16/2015  . Ankle fracture, bimalleolar, closed 08/16/2015  . Central chest pain 07/28/2015  . HLD (hyperlipidemia) 07/28/2015  . Fatty liver 09/09/2014  . Lumbago 07/07/2014  . Abnormality of gait 03/27/2014  . Memory loss 01/08/2014  . Exocrine pancreatic insufficiency ([place]) 12/30/2013  . Other abnormal glucose 11/29/2013  . Carcinoma of head of pancreas ([place]) 07/01/2013  . 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 Paresthesia of foot 06/06/2012  . Syncope 09/26/2011  .  RHINOSINUSITIS, CHRONIC 02/03/2009  . COLONIC POLYPS 06/05/2008  . OSTEOARTHRITIS 06/04/2008  . DIARRHEA, CHRONIC 04/22/2008  . Low back pain 08/03/2007  . Chronic maxillary sinusitis 04/20/2007  . Hypothyroidism 02/26/2007  . HYPERLIPIDEMIA 02/26/2007  . ANXIETY 02/26/2007  . Adjustment disorder with mixed anxiety and depressed mood 02/26/2007  . TARDIVE DYSKINESIA 02/26/2007  . GLAUCOMA 02/26/2007  . EUSTACHIAN TUBE DYSFUNCTION 02/26/2007  . Essential hypertension 02/26/2007  . MITRAL VALVE PROLAPSE 02/26/2007  . ALLERGIC RHINITIS 02/26/2007  . GERD 02/26/2007  . HIATAL HERNIA 02/26/2007  . OSTEOPOROSIS 02/26/2007    Isabelle Course, PT, DPT 01/18/2016, 11:29 AM  [facility] [address] [place], [place], [address] Phone: [phone]   Fax:  [phone]  Name: [name] MRN: [id number] Date of Birth: July 27, 1940

## 2016-01-20 ENCOUNTER — Ambulatory Visit: Payer: Medicare Other

## 2016-01-20 DIAGNOSIS — M6281 Muscle weakness (generalized): Secondary | ICD-10-CM

## 2016-01-20 DIAGNOSIS — M25562 Pain in left knee: Secondary | ICD-10-CM | POA: Diagnosis not present

## 2016-01-20 DIAGNOSIS — R600 Localized edema: Secondary | ICD-10-CM

## 2016-01-20 NOTE — Therapy (Signed)
[name] [address] [place], [place], [address] Phone: [phone]   Fax:  [phone]  Physical Therapy Treatment  Patient Details  Name: [name] MRN: [id number] Date of Birth: 1940/06/13 Referring Provider: Dr. Rodell Perna  Encounter Date: 01/20/2016      PT End of Session - 01/20/16 1124    Visit Number 6   Number of Visits 10   Date for PT Re-Evaluation 02/25/16   Authorization Type medicare 10th g-code   PT Start Time 1052   PT Stop Time 1147   PT Time Calculation (min) 55 min   Activity Tolerance Patient tolerated treatment well   Behavior During Therapy [facility] for tasks assessed/performed      Past Medical History:  Diagnosis Date  . Allergic rhinitis   . Cancer ([place]) 07/10/13   Pancreatic cancer with MRI scan 06-19-13  . Chronic maxillary sinusitis    neti pot  . Depression    alone a lot  . Eustachian tube dysfunction   . GERD (gastroesophageal reflux disease)   . Glaucoma   . Heart murmur    hx. "MVP" -predental antibiotics  . Hiatal hernia   . Hypertension   . Hypothyroid   . Memory loss    short term memory loss  . Mitral valve prolapse    antibiotics before dental procedures  . Osteoarthritis   . Stroke [facility])    mini storkes left leg paraylsis. patient denies weakness 01/08/14.   . Tardive dyskinesia    possibly reglan, vitamin E helps  . Urgency of urination    some UTI in past    Past Surgical History:  Procedure Laterality Date  . 1 baker cyst removed    . ABDOMINAL HYSTERECTOMY     including ovaries  . BACK SURGERY     fusion  . BLEPHAROPLASTY Bilateral    with cataract surgery  . BREAST SURGERY     Biopsy left 2 times  . COLONOSCOPY W/ POLYPECTOMY     2004 last colonoscopy, no polyps  . DILATION AND CURETTAGE OF UTERUS     x3  . ESOPHAGOGASTRODUODENOSCOPY N/A 09/11/2013   Procedure: ESOPHAGOGASTRODUODENOSCOPY (EGD);  Surgeon: Cleotis Nipper, MD;  Location: [facility]  ENDOSCOPY;  Service: Endoscopy;  Laterality: N/A;  Moderate sedation okay if MAC not available  . EUS N/A 07/10/2013   Procedure: ESOPHAGEAL ENDOSCOPIC ULTRASOUND (EUS) RADIAL;  Surgeon: Arta Silence, MD;  [facility]: [facility] ENDOSCOPY;  Service: Endoscopy;  Laterality: N/A;  . EYE SURGERY Right    cataract  . FINE NEEDLE ASPIRATION N/A 07/10/2013   Procedure: FINE NEEDLE ASPIRATION (FNA) LINEAR;  Surgeon: Arta Silence, MD;  [facility]: [facility] ENDOSCOPY;  Service: Endoscopy;  Laterality: N/A;  possible fna  . JOINT REPLACEMENT     LTKA  . KNEE SURGERY Left    x 5, total knee Left knee  . LAPAROSCOPY N/A 08/07/2013   Procedure: LAPAROSCOPY DIAGNOSTIC;  Surgeon: [name], MD;  Location: [place];  Service: General;  Laterality: N/A;  . LUMBAR SPINE SURGERY     x2  . LUMBAR SPINE SURGERY     cyst  . ORIF ANKLE FRACTURE Right 08/16/2015   Procedure: OPEN REDUCTION INTERNAL FIXATION (ORIF) ANKLE FRACTURE;  Surgeon: Meredith Pel, MD;  Location: [place];  Service: Orthopedics;  Laterality: Right;  . RADIOACTIVE SEED GUIDED EXCISIONAL BREAST BIOPSY Left 12/15/2015   Procedure: LEFT RADIOACTIVE SEED GUIDED EXCISIONAL BREAST BIOPSY;  Surgeon: [name], MD;  Location: [facility]  OR;  Service: General;  Laterality: Left;  . WHIPPLE PROCEDURE N/A 08/07/2013   Procedure: WHIPPLE PROCEDURE;  Surgeon: [name], MD;  Location: [place];  Service: General;  Laterality: N/A;    There were no vitals filed for this visit.      Subjective Assessment - 01/20/16 1048    Subjective I'm feeling OK.  Still with pain in the Lt knee.     Currently in Pain? Yes   Pain Score 5    Pain Location Knee   Pain Orientation Left   Pain Descriptors / Indicators Aching   Pain Type Chronic pain   Pain Onset More than a month ago   Pain Frequency Constant   Aggravating Factors  walking, riding bike at home   Pain Relieving Factors rest, pain meds                         [facility] Adult PT Treatment/Exercise -  01/20/16 0001      Knee/Hip Exercises: Stretches   Active Hamstring Stretch Left;3 reps;20 seconds     Knee/Hip Exercises: Aerobic   Stationary Bike level 2 x 8 minutes     Knee/Hip Exercises: Standing   Rocker Board 3 minutes   Rebounder weight shifting 3 ways 1 x minute each   Walking with Sports Cord forward and reverse 20# x 10 each     Knee/Hip Exercises: Seated   Long Arc Quad Strengthening;Left;2 sets;10 reps     Knee/Hip Exercises: Supine   Bridges Limitations 2 x 10   Straight Leg Raises Strengthening;Left;2 sets;10 reps     Vasopneumatic   Number Minutes Vasopneumatic  15 minutes   Vasopnuematic Location  Knee   Vasopneumatic Pressure Medium   Vasopneumatic Temperature  3 flakes                  PT Short Term Goals - 01/20/16 1056      PT SHORT TERM GOAL #1   Title Pt will be independent with initial HEP   Status Achieved     PT SHORT TERM GOAL #3   Title pain with walking in left leg decreased >/= 25%   Status Achieved     PT SHORT TERM GOAL #4   Title going up and down steps at home with increased confidence >/= 25%   Status Achieved           PT Long Term Goals - 01/18/16 1058      PT LONG TERM GOAL #1   Title Pt iindependent with advanced HEP   Status On-going     PT LONG TERM GOAL #2   Title Pt will improve FOTO to < = to 53%% to demo improved functional abilities   Status On-going     PT LONG TERM GOAL #3   Title Berg balance score >/= 52/56 due to increased strength   Status On-going     PT LONG TERM GOAL #4   Title be able to square dance with a partner due to left LE strength >/= 4/5   Status On-going     PT LONG TERM GOAL #5   Title go up and down steps at home with increased confidence of not falling >/= 75%   Status On-going     PT LONG TERM GOAL #6   Title go back to church due to being able to walk with pain decreased >/= 50%   Status On-going     PT  LONG TERM GOAL #7   Title swelling in left knee decreased by  1 cm to reduce pain   Status On-going               Plan - 01/20/16 1100    Clinical Impression Statement Pt reports 45% overall improvement in Lt knee symptoms since the start of care and is able to negotiate steps with improved confidence.  Pt with intermittent edema in the Lt knee.  Pt demonstrates bil. knee flexion with gait.  Pt will continue to benefit from skilled PT for strength, flexibility and edema management.    Rehab Potential Excellent   PT Frequency 2x / week   PT Duration 8 weeks   PT Treatment/Interventions Cryotherapy;Electrical Stimulation;Stair training;Gait training;Moist Heat;Therapeutic activities;Therapeutic exercise;Balance training;Neuromuscular re-education;Patient/family education;Passive range of motion;Manual techniques;Vasopneumatic Device   PT Next Visit Plan Balance exercises, strength, endurance, edema management.  Retest [employer] and Agree with Plan of Care Patient      Patient will benefit from skilled therapeutic intervention in order to improve the following deficits and impairments:  Abnormal gait, Decreased range of motion, Difficulty walking, Increased fascial restricitons, Decreased endurance, Increased muscle spasms, Pain, Decreased activity tolerance, Decreased balance, Impaired flexibility, Decreased mobility, Decreased strength, Increased edema  Visit Diagnosis: Muscle weakness (generalized)  Pain in left knee  Localized edema     Problem List Patient Active Problem List   Diagnosis Date Noted  . Abnormal radionuclide bone scan 08/16/2015  . Ankle fracture, bimalleolar, closed 08/16/2015  . Central chest pain 07/28/2015  . HLD (hyperlipidemia) 07/28/2015  . Fatty liver 09/09/2014  . Lumbago 07/07/2014  . Abnormality of gait 03/27/2014  . Memory loss 01/08/2014  . Exocrine pancreatic insufficiency ([place]) 12/30/2013  . Other abnormal glucose 11/29/2013  . Carcinoma of head of pancreas ([place]) 07/01/2013  . Hereditary  and idiopathic peripheral neuropathy 08/30/2012  . Paresthesia of foot 06/06/2012  . Syncope 09/26/2011  . RHINOSINUSITIS, CHRONIC 02/03/2009  . COLONIC POLYPS 06/05/2008  . OSTEOARTHRITIS 06/04/2008  . DIARRHEA, CHRONIC 04/22/2008  . Low back pain 08/03/2007  . Chronic maxillary sinusitis 04/20/2007  . Hypothyroidism 02/26/2007  . HYPERLIPIDEMIA 02/26/2007  . ANXIETY 02/26/2007  . Adjustment disorder with mixed anxiety and depressed mood 02/26/2007  . TARDIVE DYSKINESIA 02/26/2007  . GLAUCOMA 02/26/2007  . EUSTACHIAN TUBE DYSFUNCTION 02/26/2007  . Essential hypertension 02/26/2007  . MITRAL VALVE PROLAPSE 02/26/2007  . ALLERGIC RHINITIS 02/26/2007  . GERD 02/26/2007  . HIATAL HERNIA 02/26/2007  . OSTEOPOROSIS 02/26/2007     Sigurd Sos, PT 01/20/16 11:31 AM   [facility] [address] [place], [place], [address] Phone: [phone]   Fax:  [phone]  Name: [name] MRN: [id number] Date of Birth: 10/29/1940

## 2016-01-22 DIAGNOSIS — M25562 Pain in left knee: Secondary | ICD-10-CM | POA: Diagnosis not present

## 2016-01-22 DIAGNOSIS — M25462 Effusion, left knee: Secondary | ICD-10-CM | POA: Diagnosis not present

## 2016-01-23 ENCOUNTER — Other Ambulatory Visit: Payer: Self-pay | Admitting: Family Medicine

## 2016-01-27 ENCOUNTER — Ambulatory Visit: Payer: Medicare Other

## 2016-01-27 DIAGNOSIS — M25562 Pain in left knee: Secondary | ICD-10-CM

## 2016-01-27 DIAGNOSIS — R600 Localized edema: Secondary | ICD-10-CM | POA: Diagnosis not present

## 2016-01-27 DIAGNOSIS — M6281 Muscle weakness (generalized): Secondary | ICD-10-CM

## 2016-01-27 NOTE — Therapy (Signed)
[facility] [address], [place], [place], [address] Phone: [phone]   Fax:  [phone]  Physical Therapy Treatment  Patient Details  Name: [name] MRN: [id number] Date of Birth: 1940/07/22 Referring Provider: Dr. Rodell Perna  Encounter Date: 01/27/2016      PT End of Session - 01/27/16 1131    Visit Number 7   Number of Visits 10   Date for PT Re-Evaluation 02/25/16   Authorization Type medicare 10th g-code   PT Start Time 1055   PT Stop Time 1140   PT Time Calculation (min) 45 min   Activity Tolerance Patient tolerated treatment well   Behavior During Therapy [facility] for tasks assessed/performed      Past Medical History:  Diagnosis Date  . Allergic rhinitis   . Cancer ([place]) 07/10/13   Pancreatic cancer with MRI scan 06-19-13  . Chronic maxillary sinusitis    neti pot  . Depression    alone a lot  . Eustachian tube dysfunction   . GERD (gastroesophageal reflux disease)   . Glaucoma   . Heart murmur    hx. "MVP" -predental antibiotics  . Hiatal hernia   . Hypertension   . Hypothyroid   . Memory loss    short term memory loss  . Mitral valve prolapse    antibiotics before dental procedures  . Osteoarthritis   . Stroke [facility])    mini storkes left leg paraylsis. patient denies weakness 01/08/14.   . Tardive dyskinesia    possibly reglan, vitamin E helps  . Urgency of urination    some UTI in past    Past Surgical History:  Procedure Laterality Date  . 1 baker cyst removed    . ABDOMINAL HYSTERECTOMY     including ovaries  . BACK SURGERY     fusion  . BLEPHAROPLASTY Bilateral    with cataract surgery  . BREAST SURGERY     Biopsy left 2 times  . COLONOSCOPY W/ POLYPECTOMY     2004 last colonoscopy, no polyps  . DILATION AND CURETTAGE OF UTERUS     x3  . ESOPHAGOGASTRODUODENOSCOPY N/A 09/11/2013   Procedure: ESOPHAGOGASTRODUODENOSCOPY (EGD);  Surgeon: Cleotis Nipper, MD;  Location: [facility]  ENDOSCOPY;  Service: Endoscopy;  Laterality: N/A;  Moderate sedation okay if MAC not available  . EUS N/A 07/10/2013   Procedure: ESOPHAGEAL ENDOSCOPIC ULTRASOUND (EUS) RADIAL;  Surgeon: Arta Silence, MD;  [facility];  Service: Endoscopy;  Laterality: N/A;  . EYE SURGERY Right    cataract  . FINE NEEDLE ASPIRATION N/A 07/10/2013   Procedure: FINE NEEDLE ASPIRATION (FNA) LINEAR;  Surgeon: Arta Silence, MD;  [facility];  Service: Endoscopy;  Laterality: N/A;  possible fna  . JOINT REPLACEMENT     LTKA  . KNEE SURGERY Left    x 5, total knee Left knee  . LAPAROSCOPY N/A 08/07/2013   Procedure: LAPAROSCOPY DIAGNOSTIC;  Surgeon: [name], MD;  Location: [place];  Service: General;  Laterality: N/A;  . LUMBAR SPINE SURGERY     x2  . LUMBAR SPINE SURGERY     cyst  . ORIF ANKLE FRACTURE Right 08/16/2015   Procedure: OPEN REDUCTION INTERNAL FIXATION (ORIF) ANKLE FRACTURE;  Surgeon: Meredith Pel, MD;  Location: [place];  Service: Orthopedics;  Laterality: Right;  . RADIOACTIVE SEED GUIDED EXCISIONAL BREAST BIOPSY Left 12/15/2015   Procedure: LEFT RADIOACTIVE SEED GUIDED EXCISIONAL BREAST BIOPSY;  Surgeon: [name], MD;  Location: [facility]  OR;  Service: General;  Laterality: Left;  . WHIPPLE PROCEDURE N/A 08/07/2013   Procedure: WHIPPLE PROCEDURE;  Surgeon: [name], MD;  Location: [place];  Service: General;  Laterality: N/A;    There were no vitals filed for this visit.      Subjective Assessment - 01/27/16 1107    Subjective Pt saw MD, he had to draw fluid off of the knee.  Will have Lt knee arthroscopy 02/15/16 to explore Lt knee.     Patient Stated Goals reduce pain in left knee;    Currently in Pain? Yes   Pain Score 3    Pain Location Knee   Pain Orientation Left   Pain Descriptors / Indicators Aching   Pain Type Chronic pain   Pain Onset More than a month ago   Pain Frequency Constant   Aggravating Factors  walking, movement, sometimes random   Pain  Relieving Factors rest, pain meds, ice                         [facility] Adult PT Treatment/Exercise - 01/27/16 0001      Knee/Hip Exercises: Stretches   Active Hamstring Stretch Left;3 reps;20 seconds     Knee/Hip Exercises: Aerobic   Stationary Bike level 2 x 8 minutes     Knee/Hip Exercises: Standing   Rocker Board 3 minutes   Rebounder weight shifting 3 ways 1 x minute each     Knee/Hip Exercises: Seated   Long Arc Quad Strengthening;Left;2 sets;10 reps     Knee/Hip Exercises: Supine   Straight Leg Raises Strengthening;Left;2 sets;10 reps     Vasopneumatic   Number Minutes Vasopneumatic  10 minutes   Vasopnuematic Location  Knee   Vasopneumatic Pressure Medium   Vasopneumatic Temperature  3 flakes                  PT Short Term Goals - 01/20/16 1056      PT SHORT TERM GOAL #1   Title Pt will be independent with initial HEP   Status Achieved     PT SHORT TERM GOAL #3   Title pain with walking in left leg decreased >/= 25%   Status Achieved     PT SHORT TERM GOAL #4   Title going up and down steps at home with increased confidence >/= 25%   Status Achieved           PT Long Term Goals - 01/27/16 1110      PT LONG TERM GOAL #1   Title Pt iindependent with advanced HEP   Time 8   Period Weeks   Status On-going     PT LONG TERM GOAL #2   Title Pt will improve FOTO to < = to 53%% to demo improved functional abilities   Time 8   Period Weeks   Status On-going     PT LONG TERM GOAL #4   Title be able to square dance with a partner due to left LE strength >/= 4/5   Time 8   Period Weeks   Status On-going     PT LONG TERM GOAL #5   Title go up and down steps at home with increased confidence of not falling >/= 75%   Status Achieved     PT LONG TERM GOAL #6   Title go back to church due to being able to walk with pain decreased >/= 50%   Time 8   Period  Weeks   Status On-going               Plan - 01/27/16 1114     Clinical Impression Statement Pt reports that she will have arthroscopic procedure to Lt knee 02/15/16 due to continued pain and edema in the Lt knee.  Pt had to have fluid removed from Lt knee last week.  Pt reports that she is able to negotiate steps without instability.  Pt has not returned to church or dancing.  Pt will continue to benefit from skilled PT for Lt knee strength, endurance and flexiblity before having surgery in 2 weeks.   Rehab Potential Excellent   PT Frequency 2x / week   PT Duration 8 weeks   PT Treatment/Interventions Cryotherapy;Electrical Stimulation;Stair training;Gait training;Moist Heat;Therapeutic activities;Therapeutic exercise;Balance training;Neuromuscular re-education;Patient/family education;Passive range of motion;Manual techniques;Vasopneumatic Device   PT Next Visit Plan Balance exercises, strength, endurance, edema management.  Retest [employer] and Agree with Plan of Care Patient      Patient will benefit from skilled therapeutic intervention in order to improve the following deficits and impairments:  Abnormal gait, Decreased range of motion, Difficulty walking, Increased fascial restricitons, Decreased endurance, Increased muscle spasms, Pain, Decreased activity tolerance, Decreased balance, Impaired flexibility, Decreased mobility, Decreased strength, Increased edema  Visit Diagnosis: Muscle weakness (generalized)  Pain in left knee  Localized edema     Problem List Patient Active Problem List   Diagnosis Date Noted  . Abnormal radionuclide bone scan 08/16/2015  . Ankle fracture, bimalleolar, closed 08/16/2015  . Central chest pain 07/28/2015  . HLD (hyperlipidemia) 07/28/2015  . Fatty liver 09/09/2014  . Lumbago 07/07/2014  . Abnormality of gait 03/27/2014  . Memory loss 01/08/2014  . Exocrine pancreatic insufficiency ([place]) 12/30/2013  . Other abnormal glucose 11/29/2013  . Carcinoma of head of pancreas ([place]) 07/01/2013  . Hereditary  and idiopathic peripheral neuropathy 08/30/2012  . Paresthesia of foot 06/06/2012  . Syncope 09/26/2011  . RHINOSINUSITIS, CHRONIC 02/03/2009  . COLONIC POLYPS 06/05/2008  . OSTEOARTHRITIS 06/04/2008  . DIARRHEA, CHRONIC 04/22/2008  . Low back pain 08/03/2007  . Chronic maxillary sinusitis 04/20/2007  . Hypothyroidism 02/26/2007  . HYPERLIPIDEMIA 02/26/2007  . ANXIETY 02/26/2007  . Adjustment disorder with mixed anxiety and depressed mood 02/26/2007  . TARDIVE DYSKINESIA 02/26/2007  . GLAUCOMA 02/26/2007  . EUSTACHIAN TUBE DYSFUNCTION 02/26/2007  . Essential hypertension 02/26/2007  . MITRAL VALVE PROLAPSE 02/26/2007  . ALLERGIC RHINITIS 02/26/2007  . GERD 02/26/2007  . HIATAL HERNIA 02/26/2007  . OSTEOPOROSIS 02/26/2007     Sigurd Sos, PT 01/27/16 11:32 AM   [facility] [address] [place], [place], [address] Phone: [phone]   Fax:  [phone]  Name: TELLIE HACKLEY MRN: [id number] Date of Birth: [birth year]

## 2016-01-28 ENCOUNTER — Ambulatory Visit: Payer: Medicare Other

## 2016-01-28 DIAGNOSIS — R600 Localized edema: Secondary | ICD-10-CM

## 2016-01-28 DIAGNOSIS — M25562 Pain in left knee: Secondary | ICD-10-CM | POA: Diagnosis not present

## 2016-01-28 DIAGNOSIS — M6281 Muscle weakness (generalized): Secondary | ICD-10-CM

## 2016-01-28 NOTE — Therapy (Signed)
[facility] [address], [place], [place], [address] Phone: [phone]   Fax:  [phone]  Physical Therapy Treatment  Patient Details  Name: [name] MRN: [id number] Date of Birth: 19-Feb-1941 Referring Provider: Dr. Rodell Perna  Encounter Date: 01/28/2016      PT End of Session - 01/28/16 1212    Visit Number 8   Number of Visits 10   Date for PT Re-Evaluation 02/25/16   Authorization Type medicare 10th g-code   PT Start Time 1140  Pt with a headache today so limited exercise   PT Stop Time 1227   PT Time Calculation (min) 47 min   Activity Tolerance Patient tolerated treatment well;Other (comment)   Behavior During Therapy WFL for tasks assessed/performed      Past Medical History:  Diagnosis Date  . Allergic rhinitis   . Cancer ([place]) 07/10/13   Pancreatic cancer with MRI scan 06-19-13  . Chronic maxillary sinusitis    neti pot  . Depression    alone a lot  . Eustachian tube dysfunction   . GERD (gastroesophageal reflux disease)   . Glaucoma   . Heart murmur    hx. "MVP" -predental antibiotics  . Hiatal hernia   . Hypertension   . Hypothyroid   . Memory loss    short term memory loss  . Mitral valve prolapse    antibiotics before dental procedures  . Osteoarthritis   . Stroke [facility])    mini storkes left leg paraylsis. patient denies weakness 01/08/14.   . Tardive dyskinesia    possibly reglan, vitamin E helps  . Urgency of urination    some UTI in past    Past Surgical History:  Procedure Laterality Date  . 1 baker cyst removed    . ABDOMINAL HYSTERECTOMY     including ovaries  . BACK SURGERY     fusion  . BLEPHAROPLASTY Bilateral    with cataract surgery  . BREAST SURGERY     Biopsy left 2 times  . COLONOSCOPY W/ POLYPECTOMY     2004 last colonoscopy, no polyps  . DILATION AND CURETTAGE OF UTERUS     x3  . ESOPHAGOGASTRODUODENOSCOPY N/A 09/11/2013   Procedure:  ESOPHAGOGASTRODUODENOSCOPY (EGD);  Surgeon: Cleotis Nipper, MD;  Location: [facility] ENDOSCOPY;  Service: Endoscopy;  Laterality: N/A;  Moderate sedation okay if MAC not available  . EUS N/A 07/10/2013   Procedure: ESOPHAGEAL ENDOSCOPIC ULTRASOUND (EUS) RADIAL;  Surgeon: Arta Silence, MD;  Location: [facility] ENDOSCOPY;  Service: Endoscopy;  Laterality: N/A;  . EYE SURGERY Right    cataract  . FINE NEEDLE ASPIRATION N/A 07/10/2013   Procedure: FINE NEEDLE ASPIRATION (FNA) LINEAR;  Surgeon: Arta Silence, MD;  Location: [facility] ENDOSCOPY;  Service: Endoscopy;  Laterality: N/A;  possible fna  . JOINT REPLACEMENT     LTKA  . KNEE SURGERY Left    x 5, total knee Left knee  . LAPAROSCOPY N/A 08/07/2013   Procedure: LAPAROSCOPY DIAGNOSTIC;  Surgeon: [name], MD;  Location: [place];  Service: General;  Laterality: N/A;  . LUMBAR SPINE SURGERY     x2  . LUMBAR SPINE SURGERY     cyst  . ORIF ANKLE FRACTURE Right 08/16/2015   Procedure: OPEN REDUCTION INTERNAL FIXATION (ORIF) ANKLE FRACTURE;  Surgeon: Meredith Pel, MD;  Location: [place];  Service: Orthopedics;  Laterality: Right;  . RADIOACTIVE SEED GUIDED EXCISIONAL BREAST BIOPSY Left 12/15/2015   Procedure: LEFT RADIOACTIVE SEED GUIDED EXCISIONAL  BREAST BIOPSY;  Surgeon: [name], MD;  Location: [place];  Service: General;  Laterality: Left;  . WHIPPLE PROCEDURE N/A 08/07/2013   Procedure: WHIPPLE PROCEDURE;  Surgeon: [name], MD;  Location: [place];  Service: General;  Laterality: N/A;    There were no vitals filed for this visit.      Subjective Assessment - 01/28/16 1146    Subjective Pt with headache today.  Not feeling well because of this.     Currently in Pain? Yes   Pain Score 5    Pain Location Knee   Pain Orientation Left   Pain Descriptors / Indicators Aching   Pain Type Chronic pain   Pain Onset More than a month ago   Pain Frequency Constant                         [facility] Adult PT Treatment/Exercise - 01/28/16  0001      Knee/Hip Exercises: Stretches   Active Hamstring Stretch Left;3 reps;20 seconds     Knee/Hip Exercises: Aerobic   Stationary Bike level 2 x 8 minutes     Knee/Hip Exercises: Standing   Rocker Board 3 minutes   Rebounder weight shifting 3 ways 1 x minute each     Knee/Hip Exercises: Seated   Long Arc Quad Strengthening;Left;2 sets;10 reps     Knee/Hip Exercises: Supine   Straight Leg Raises Strengthening;Left;2 sets;10 reps     Vasopneumatic   Number Minutes Vasopneumatic  15 minutes   Vasopnuematic Location  Knee   Vasopneumatic Pressure Medium   Vasopneumatic Temperature  3 flakes                  PT Short Term Goals - 01/20/16 1056      PT SHORT TERM GOAL #1   Title Pt will be independent with initial HEP   Status Achieved     PT SHORT TERM GOAL #3   Title pain with walking in left leg decreased >/= 25%   Status Achieved     PT SHORT TERM GOAL #4   Title going up and down steps at home with increased confidence >/= 25%   Status Achieved           PT Long Term Goals - 01/27/16 1110      PT LONG TERM GOAL #1   Title Pt iindependent with advanced HEP   Time 8   Period Weeks   Status On-going     PT LONG TERM GOAL #2   Title Pt will improve FOTO to < = to 53%% to demo improved functional abilities   Time 8   Period Weeks   Status On-going     PT LONG TERM GOAL #4   Title be able to square dance with a partner due to left LE strength >/= 4/5   Time 8   Period Weeks   Status On-going     PT LONG TERM GOAL #5   Title go up and down steps at home with increased confidence of not falling >/= 75%   Status Achieved     PT LONG TERM GOAL #6   Title go back to church due to being able to walk with pain decreased >/= 50%   Time 8   Period Weeks   Status On-going               Plan - 01/28/16 1147    Clinical Impression Statement Pt is  not feeling well today due to headache so performed low level exercise today.  Pt will have  Lt knee arthroscopy 02/15/16 due to continued edema and pain in the Lt knee.  Pt with continued Lt knee weakness, instability, pain and edema.  Pt will continue to benefit from skilled PT for Lt knee strength, endurance, and flexibility before surgery in 2 weeks.     Rehab Potential Excellent   PT Frequency 2x / week   PT Duration 8 weeks   PT Treatment/Interventions Cryotherapy;Electrical Stimulation;Stair training;Gait training;Moist Heat;Therapeutic activities;Therapeutic exercise;Balance training;Neuromuscular re-education;Patient/family education;Passive range of motion;Manual techniques;Vasopneumatic Device   PT Next Visit Plan Balance exercises, strength, endurance, edema management.  Retest [employer] and Agree with Plan of Care Patient      Patient will benefit from skilled therapeutic intervention in order to improve the following deficits and impairments:  Abnormal gait, Decreased range of motion, Difficulty walking, Increased fascial restricitons, Decreased endurance, Increased muscle spasms, Pain, Decreased activity tolerance, Decreased balance, Impaired flexibility, Decreased mobility, Decreased strength, Increased edema  Visit Diagnosis: Muscle weakness (generalized)  Pain in left knee  Localized edema     Problem List Patient Active Problem List   Diagnosis Date Noted  . Abnormal radionuclide bone scan 08/16/2015  . Ankle fracture, bimalleolar, closed 08/16/2015  . Central chest pain 07/28/2015  . HLD (hyperlipidemia) 07/28/2015  . Fatty liver 09/09/2014  . Lumbago 07/07/2014  . Abnormality of gait 03/27/2014  . Memory loss 01/08/2014  . Exocrine pancreatic insufficiency ([place]) 12/30/2013  . Other abnormal glucose 11/29/2013  . Carcinoma of head of pancreas ([place]) 07/01/2013  . 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 Paresthesia of foot 06/06/2012  . Syncope 09/26/2011  . RHINOSINUSITIS, CHRONIC 02/03/2009  . COLONIC POLYPS 06/05/2008   . OSTEOARTHRITIS 06/04/2008  . DIARRHEA, CHRONIC 04/22/2008  . Low back pain 08/03/2007  . Chronic maxillary sinusitis 04/20/2007  . Hypothyroidism 02/26/2007  . HYPERLIPIDEMIA 02/26/2007  . ANXIETY 02/26/2007  . Adjustment disorder with mixed anxiety and depressed mood 02/26/2007  . TARDIVE DYSKINESIA 02/26/2007  . GLAUCOMA 02/26/2007  . EUSTACHIAN TUBE DYSFUNCTION 02/26/2007  . Essential hypertension 02/26/2007  . MITRAL VALVE PROLAPSE 02/26/2007  . ALLERGIC RHINITIS 02/26/2007  . GERD 02/26/2007  . HIATAL HERNIA 02/26/2007  . OSTEOPOROSIS 02/26/2007     Sigurd Sos, PT 01/28/16 12:13 PM  [facility] [address] [place], [place], [address] Phone: [phone]   Fax:  [phone]  Name: [name] MRN: [id number] Date of Birth: Oct 29, 1940

## 2016-02-02 ENCOUNTER — Ambulatory Visit: Payer: Medicare Other | Attending: Orthopaedic Surgery

## 2016-02-02 DIAGNOSIS — R600 Localized edema: Secondary | ICD-10-CM | POA: Insufficient documentation

## 2016-02-02 DIAGNOSIS — M25562 Pain in left knee: Secondary | ICD-10-CM | POA: Insufficient documentation

## 2016-02-02 DIAGNOSIS — M6281 Muscle weakness (generalized): Secondary | ICD-10-CM

## 2016-02-02 NOTE — Therapy (Addendum)
[facility] [address] [place], [place], [address] Phone: [phone]   Fax:  [phone]  Physical Therapy Treatment  Patient Details  Name: [name] MRN: [id number] Date of Birth: Sep 30, 1940 Referring Provider: Dr. Rodell Perna  Encounter Date: 02/02/2016      PT End of Session - 02/02/16 1215    Visit Number 9   Number of Visits 10   Date for PT Re-Evaluation 02/25/16   Authorization Type medicare 10th g-code   PT Start Time 1143   PT Stop Time 1227   PT Time Calculation (min) 44 min   Activity Tolerance Patient limited by pain  neuropathy   Behavior During Therapy [facility] for tasks assessed/performed      Past Medical History:  Diagnosis Date  . Allergic rhinitis   . Cancer ([place]) 07/10/13   Pancreatic cancer with MRI scan 06-19-13  . Chronic maxillary sinusitis    neti pot  . Depression    alone a lot  . Eustachian tube dysfunction   . GERD (gastroesophageal reflux disease)   . Glaucoma   . Heart murmur    hx. "MVP" -predental antibiotics  . Hiatal hernia   . Hypertension   . Hypothyroid   . Memory loss    short term memory loss  . Mitral valve prolapse    antibiotics before dental procedures  . Osteoarthritis   . Stroke [facility])    mini storkes left leg paraylsis. patient denies weakness 01/08/14.   . Tardive dyskinesia    possibly reglan, vitamin E helps  . Urgency of urination    some UTI in past    Past Surgical History:  Procedure Laterality Date  . 1 baker cyst removed    . ABDOMINAL HYSTERECTOMY     including ovaries  . BACK SURGERY     fusion  . BLEPHAROPLASTY Bilateral    with cataract surgery  . BREAST SURGERY     Biopsy left 2 times  . COLONOSCOPY W/ POLYPECTOMY     2004 last colonoscopy, no polyps  . DILATION AND CURETTAGE OF UTERUS     x3  . ESOPHAGOGASTRODUODENOSCOPY N/A 09/11/2013   Procedure: ESOPHAGOGASTRODUODENOSCOPY (EGD);  Surgeon: Cleotis Nipper, MD;  Location:  [facility] ENDOSCOPY;  Service: Endoscopy;  Laterality: N/A;  Moderate sedation okay if MAC not available  . EUS N/A 07/10/2013   Procedure: ESOPHAGEAL ENDOSCOPIC ULTRASOUND (EUS) RADIAL;  Surgeon: Arta Silence, MD;  [facility];  Service: Endoscopy;  Laterality: N/A;  . EYE SURGERY Right    cataract  . FINE NEEDLE ASPIRATION N/A 07/10/2013   Procedure: FINE NEEDLE ASPIRATION (FNA) LINEAR;  Surgeon: Arta Silence, MD;  [facility]: [facility];  Service: Endoscopy;  Laterality: N/A;  possible fna  . JOINT REPLACEMENT     LTKA  . KNEE SURGERY Left    x 5, total knee Left knee  . LAPAROSCOPY N/A 08/07/2013   Procedure: LAPAROSCOPY DIAGNOSTIC;  Surgeon: [name], MD;  Location: [place];  Service: General;  Laterality: N/A;  . LUMBAR SPINE SURGERY     x2  . LUMBAR SPINE SURGERY     cyst  . ORIF ANKLE FRACTURE Right 08/16/2015   Procedure: OPEN REDUCTION INTERNAL FIXATION (ORIF) ANKLE FRACTURE;  Surgeon: Meredith Pel, MD;  Location: [place];  Service: Orthopedics;  Laterality: Right;  . RADIOACTIVE SEED GUIDED EXCISIONAL BREAST BIOPSY Left 12/15/2015   Procedure: LEFT RADIOACTIVE SEED GUIDED EXCISIONAL BREAST BIOPSY;  Surgeon: [name], MD;  Location: [facility];  Service: General;  Laterality: Left;  . WHIPPLE PROCEDURE N/A 08/07/2013   Procedure: WHIPPLE PROCEDURE;  Surgeon: [name], MD;  Location: [place];  Service: General;  Laterality: N/A;    There were no vitals filed for this visit.      Subjective Assessment - 02/02/16 1149    Subjective Pt reports that her neuropathy was bad over the weekend.  Lt knee is feeling OK.  No pain today.     Currently in Pain? No/denies                         [facility] Adult PT Treatment/Exercise - 02/02/16 0001      Knee/Hip Exercises: Stretches   Active Hamstring Stretch Left;3 reps;20 seconds     Knee/Hip Exercises: Aerobic   Stationary Bike level 2 x 8 minutes     Knee/Hip Exercises: Standing   Forward Step Up 2  sets;10 reps;Both   Rocker Board 3 minutes   Rebounder weight shifting 3 ways 1 x minute each     Knee/Hip Exercises: Seated   Long Arc Quad Strengthening;Left;2 sets;10 reps     Knee/Hip Exercises: Supine   Straight Leg Raises Strengthening;Left;2 sets;10 reps     Vasopneumatic   Number Minutes Vasopneumatic  10 minutes   Vasopnuematic Location  Knee   Vasopneumatic Pressure Medium   Vasopneumatic Temperature  3 flakes                  PT Short Term Goals - 01/20/16 1056      PT SHORT TERM GOAL #1   Title Pt will be independent with initial HEP   Status Achieved     PT SHORT TERM GOAL #3   Title pain with walking in left leg decreased >/= 25%   Status Achieved     PT SHORT TERM GOAL #4   Title going up and down steps at home with increased confidence >/= 25%   Status Achieved           PT Long Term Goals - 02/02/16 1150      PT LONG TERM GOAL #1   Title Pt iindependent with advanced HEP   Time 8   Period Weeks   Status On-going     PT LONG TERM GOAL #2   Title Pt will improve FOTO to < = to 53%% to demo improved functional abilities   Time 8   Period Weeks   Status On-going     PT LONG TERM GOAL #4   Title be able to square dance with a partner due to left LE strength >/= 4/5   Time 8   Period Weeks   Status On-going     PT LONG TERM GOAL #5   Title go up and down steps at home with increased confidence of not falling >/= 75%   Status Achieved     PT LONG TERM GOAL #6   Title go back to church due to being able to walk with pain decreased >/= 50%     PT LONG TERM GOAL #7   Title swelling in left knee decreased by 1 cm to reduce pain   Time 8   Period Weeks   Status On-going               Plan - 02/02/16 1151    Clinical Impression Statement Lt knee pain is improved today.  Pt is limited by bil. LE  neuropathy.  Pt will have knee arthroscopy 01/1816 due to continued edema and pain in the Lt knee.  Pt has not returned to square  dancing due to edema and pain.  Pt will continue to benefit from skilled PT for Lt knee strength, endurance and flexiblity before surgery in 2 weeks.     Rehab Potential Excellent   PT Frequency 2x / week   PT Duration 8 weeks   PT Treatment/Interventions Cryotherapy;Electrical Stimulation;Stair training;Gait training;Moist Heat;Therapeutic activities;Therapeutic exercise;Balance training;Neuromuscular re-education;Patient/family education;Passive range of motion;Manual techniques;Vasopneumatic Device   PT Next Visit Plan Plan D/C next session.  G-codes and [employer] and Agree with Plan of Care Patient      Patient will benefit from skilled therapeutic intervention in order to improve the following deficits and impairments:  Abnormal gait, Decreased range of motion, Difficulty walking, Increased fascial restricitons, Decreased endurance, Increased muscle spasms, Pain, Decreased activity tolerance, Decreased balance, Impaired flexibility, Decreased mobility, Decreased strength, Increased edema  Visit Diagnosis: Muscle weakness (generalized)  Pain in left knee  Localized edema     Problem List Patient Active Problem List   Diagnosis Date Noted  . Abnormal radionuclide bone scan 08/16/2015  . Ankle fracture, bimalleolar, closed 08/16/2015  . Central chest pain 07/28/2015  . HLD (hyperlipidemia) 07/28/2015  . Fatty liver 09/09/2014  . Lumbago 07/07/2014  . Abnormality of gait 03/27/2014  . Memory loss 01/08/2014  . Exocrine pancreatic insufficiency ([place]) 12/30/2013  . Other abnormal glucose 11/29/2013  . Carcinoma of head of pancreas ([place]) 07/01/2013  . 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 Paresthesia of foot 06/06/2012  . Syncope 09/26/2011  . RHINOSINUSITIS, CHRONIC 02/03/2009  . COLONIC POLYPS 06/05/2008  . OSTEOARTHRITIS 06/04/2008  . DIARRHEA, CHRONIC 04/22/2008  . Low back pain 08/03/2007  . Chronic maxillary sinusitis 04/20/2007  .  Hypothyroidism 02/26/2007  . HYPERLIPIDEMIA 02/26/2007  . ANXIETY 02/26/2007  . Adjustment disorder with mixed anxiety and depressed mood 02/26/2007  . TARDIVE DYSKINESIA 02/26/2007  . GLAUCOMA 02/26/2007  . EUSTACHIAN TUBE DYSFUNCTION 02/26/2007  . Essential hypertension 02/26/2007  . MITRAL VALVE PROLAPSE 02/26/2007  . ALLERGIC RHINITIS 02/26/2007  . GERD 02/26/2007  . HIATAL HERNIA 02/26/2007  . OSTEOPOROSIS 02/26/2007     Sigurd Sos, PT 02/02/16 12:22 PM  [facility] [address] [place], [place], [address] Phone: [phone]   Fax:  [phone]  Name: [name] MRN: [id number] Date of Birth: 1940/06/14

## 2016-02-05 ENCOUNTER — Ambulatory Visit: Payer: Medicare Other | Admitting: Physical Therapy

## 2016-02-05 ENCOUNTER — Encounter: Payer: Self-pay | Admitting: Physical Therapy

## 2016-02-05 DIAGNOSIS — R600 Localized edema: Secondary | ICD-10-CM | POA: Diagnosis not present

## 2016-02-05 DIAGNOSIS — M6281 Muscle weakness (generalized): Secondary | ICD-10-CM

## 2016-02-05 DIAGNOSIS — M25562 Pain in left knee: Secondary | ICD-10-CM

## 2016-02-05 NOTE — Therapy (Signed)
[facility] [address] [place], [place], [address] Phone: [phone]   Fax:  [phone]  Physical Therapy Treatment  Patient Details  Name: [name] MRN: [id number] Date of Birth: 08-02-1940 Referring Provider: Dr. Rodell Perna  Encounter Date: 02/05/2016      PT End of Session - 02/05/16 1133    Visit Number 10   Date for PT Re-Evaluation 02/25/16   Authorization Type medicare 10th g-code   PT Start Time 1100   PT Stop Time 1130   PT Time Calculation (min) 30 min   Activity Tolerance Patient limited by pain   Behavior During Therapy --  disoriented      Past Medical History:  Diagnosis Date  . Allergic rhinitis   . Cancer ([place]) 07/10/13   Pancreatic cancer with MRI scan 06-19-13  . Chronic maxillary sinusitis    neti pot  . Depression    alone a lot  . Eustachian tube dysfunction   . GERD (gastroesophageal reflux disease)   . Glaucoma   . Heart murmur    hx. "MVP" -predental antibiotics  . Hiatal hernia   . Hypertension   . Hypothyroid   . Memory loss    short term memory loss  . Mitral valve prolapse    antibiotics before dental procedures  . Osteoarthritis   . Stroke [facility])    mini storkes left leg paraylsis. patient denies weakness 01/08/14.   . Tardive dyskinesia    possibly reglan, vitamin E helps  . Urgency of urination    some UTI in past    Past Surgical History:  Procedure Laterality Date  . 1 baker cyst removed    . ABDOMINAL HYSTERECTOMY     including ovaries  . BACK SURGERY     fusion  . BLEPHAROPLASTY Bilateral    with cataract surgery  . BREAST SURGERY     Biopsy left 2 times  . COLONOSCOPY W/ POLYPECTOMY     2004 last colonoscopy, no polyps  . DILATION AND CURETTAGE OF UTERUS     x3  . ESOPHAGOGASTRODUODENOSCOPY N/A 09/11/2013   Procedure: ESOPHAGOGASTRODUODENOSCOPY (EGD);  Surgeon: Cleotis Nipper, MD;  Location: [facility] ENDOSCOPY;  Service: Endoscopy;  Laterality:  N/A;  Moderate sedation okay if MAC not available  . EUS N/A 07/10/2013   Procedure: ESOPHAGEAL ENDOSCOPIC ULTRASOUND (EUS) RADIAL;  Surgeon: Arta Silence, MD;  Location: [facility] ENDOSCOPY;  Service: Endoscopy;  Laterality: N/A;  . EYE SURGERY Right    cataract  . FINE NEEDLE ASPIRATION N/A 07/10/2013   Procedure: FINE NEEDLE ASPIRATION (FNA) LINEAR;  Surgeon: Arta Silence, MD;  Location: [facility] ENDOSCOPY;  Service: Endoscopy;  Laterality: N/A;  possible fna  . JOINT REPLACEMENT     LTKA  . KNEE SURGERY Left    x 5, total knee Left knee  . LAPAROSCOPY N/A 08/07/2013   Procedure: LAPAROSCOPY DIAGNOSTIC;  Surgeon: [name], MD;  Location: [place];  Service: General;  Laterality: N/A;  . LUMBAR SPINE SURGERY     x2  . LUMBAR SPINE SURGERY     cyst  . ORIF ANKLE FRACTURE Right 08/16/2015   Procedure: OPEN REDUCTION INTERNAL FIXATION (ORIF) ANKLE FRACTURE;  Surgeon: Meredith Pel, MD;  Location: [place];  Service: Orthopedics;  Laterality: Right;  . RADIOACTIVE SEED GUIDED EXCISIONAL BREAST BIOPSY Left 12/15/2015   Procedure: LEFT RADIOACTIVE SEED GUIDED EXCISIONAL BREAST BIOPSY;  Surgeon: [name], MD;  Location: [place];  Service: General;  Laterality: Left;  .  WHIPPLE PROCEDURE N/A 08/07/2013   Procedure: WHIPPLE PROCEDURE;  Surgeon: [name], MD;  Location: [place];  Service: General;  Laterality: N/A;    There were no vitals filed for this visit.      Subjective Assessment - 02/05/16 1106    Subjective My knee is feeling bad this morning and yesterday. I am having arthroscopic knee surgery on 02/15/2016 to see what is going on.    Patient Stated Goals reduce pain in left knee;    Currently in Pain? Yes   Pain Score 6    Pain Location Knee   Pain Orientation Left   Pain Descriptors / Indicators Aching   Pain Type Chronic pain   Pain Onset More than a month ago   Pain Frequency Constant   Aggravating Factors  sometimes random   Pain Relieving Factors rest, pain meds, ice   Effect  of Pain on Daily Activities None   Multiple Pain Sites No            [facility] PT Assessment - 02/05/16 0001      Assessment   Medical Diagnosis left TKA with reucrrent patella subluxation and hemoarthrosis   Referring Provider Dr. Rodell Perna   Onset Date/Surgical Date 12/31/14   Prior Therapy None     Precautions   Precautions Other (comment)  cancer precautions; memeory loss     Restrictions   Weight Bearing Restrictions No     Balance Screen   Has the patient fallen in the past 6 months No   Has the patient had a decrease in activity level because of a fear of falling?  Yes   Is the patient reluctant to leave their home because of a fear of falling?  No     Home Ecologist residence   Home Access Stairs to enter   Entrance Stairs-Number of Steps 2   Entrance Stairs-Rails None   Home Layout One level   [place] - single point     Prior Function   Level of [place] Retired     Associate Professor   Overall Cognitive Status Within Functional Limits for tasks assessed     Observation/Other Assessments   Focus on Therapeutic Outcomes (FOTO)  58% limitation     AROM   Left Knee Extension 0   Left Knee Flexion 121     PROM   Left Knee Extension 0   Left Knee Flexion 125     Strength   Left Hip Flexion 4+/5   Left Hip Extension 3+/5   Left Hip ABduction 3/5   Left Knee Flexion 5/5   Left Knee Extension 4+/5   Left Ankle Dorsiflexion 5/5   Left Ankle Inversion 5/5     Palpation   Patella mobility good   Palpation comment tenderness located in posterior left knee and lateral left knee with increased swelling                     [facility] Adult PT Treatment/Exercise - 02/05/16 0001      Self-Care   Self-Care Other Self-Care Comments   Other Self-Care Comments  discussed with patient on balance, using her cane or walker at all times due to her feeling tipsy, keeping her alert button on her at  home, if she still feels tipsy to call family MD on Monday     Knee/Hip Exercises: Aerobic   Stationary Bike level 2 x 8 minutes  PT Education - 02/05/16 1132    Education provided Yes   Education Details reviewd HEP and patient is independent, instructed patient to use assistive device at home and out in community; instructed patient to see family MD if still feels tipsy   Person(s) Educated Patient   Comprehension Verbalized understanding          PT Short Term Goals - 02/05/16 1137      PT SHORT TERM GOAL #1   Title Pt will be independent with initial HEP   Time 4   Period Weeks   Status Achieved     PT SHORT TERM GOAL #2   Title Berg Balance score >/= 48/56    Time 4   Period Weeks   Status Unable to assess  could not assess due to left knee pain     PT SHORT TERM GOAL #3   Title pain with walking in left leg decreased >/= 25%   Time 4   Period Weeks   Status Achieved     PT SHORT TERM GOAL #4   Title going up and down steps at home with increased confidence >/= 25%   Time 4   Period Weeks   Status Achieved           PT Long Term Goals - 02/05/16 1138      PT LONG TERM GOAL #1   Title Pt iindependent with advanced HEP   Time 8   Period Weeks   Status Not Met     PT LONG TERM GOAL #2   Title Pt will improve FOTO to < = to 53%% to demo improved functional abilities   Time 8   Period Weeks   Status Not Met     PT LONG TERM GOAL #3   Title Berg balance score >/= 52/56 due to increased strength   Time 8   Period Weeks   Status Not Met  unable to assess due to left knee pain     PT LONG TERM GOAL #4   Title be able to square dance with a partner due to left LE strength >/= 4/5   Time 8   Period Weeks   Status Not Met     PT LONG TERM GOAL #5   Title go up and down steps at home with increased confidence of not falling >/= 75%   Time 8   Period Weeks   Status Not Met     PT LONG TERM GOAL #6   Title go back to  church due to being able to walk with pain decreased >/= 50%   Time 8   Period Weeks   Status Not Met     PT LONG TERM GOAL #7   Title swelling in left knee decreased by 1 cm to reduce pain   Time 8   Period Weeks   Status Not Met               Plan - 02/05/16 1134    Clinical Impression Statement Patient is having arthroscopic knee surgery on 02/15/2016.  FOTO score has increased limitation to 58%. Patient was not able to exercise today due to left knee pain and feeling disoriented.  She feels she is disoriented due to taking pain medication.  Patient was to see her MD if she is still disoriented over the weekend.  Left knee AROM is 0-121. Patient has increased strength in left knee and ankle but not hip.  Patient has  not met goals due to increased left knee pain and going for surgery.    Rehab Potential Excellent   Clinical Impairments Affecting Rehab Potential None   PT Treatment/Interventions Cryotherapy;Electrical Stimulation;Stair training;Gait training;Moist Heat;Therapeutic activities;Therapeutic exercise;Balance training;Neuromuscular re-education;Patient/family education;Passive range of motion;Manual techniques;Vasopneumatic Device   PT Next Visit Plan Discharge to HEP   PT Home Exercise Plan current HEP   Consulted and Agree with Plan of Care Patient      Patient will benefit from skilled therapeutic intervention in order to improve the following deficits and impairments:  Abnormal gait, Decreased range of motion, Difficulty walking, Increased fascial restricitons, Decreased endurance, Increased muscle spasms, Pain, Decreased activity tolerance, Decreased balance, Impaired flexibility, Decreased mobility, Decreased strength, Increased edema  Visit Diagnosis: Muscle weakness (generalized)  Pain in left knee       G-Codes - 02/29/2016 1111    Functional Assessment Tool Used FOTO score 58% limitation  goal is 53% limitation   Functional Limitation Mobility: Walking  and moving around   Mobility: Walking and Moving Around Current Status [phone]) At least 40 percent but less than 60 percent impaired, limited or restricted   Mobility: Walking and Moving Around Goal Status [phone]) At least 40 percent but less than 60 percent impaired, limited or restricted      Problem List Patient Active Problem List   Diagnosis Date Noted  . Abnormal radionuclide bone scan 08/16/2015  . Ankle fracture, bimalleolar, closed 08/16/2015  . Central chest pain 07/28/2015  . HLD (hyperlipidemia) 07/28/2015  . Fatty liver 09/09/2014  . Lumbago 07/07/2014  . Abnormality of gait 03/27/2014  . Memory loss 01/08/2014  . Exocrine pancreatic insufficiency ([place]) 12/30/2013  . Other abnormal glucose 11/29/2013  . Carcinoma of head of pancreas ([place]) 07/01/2013  . 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 Paresthesia of foot 06/06/2012  . Syncope 09/26/2011  . RHINOSINUSITIS, CHRONIC 02/03/2009  . COLONIC POLYPS 06/05/2008  . OSTEOARTHRITIS 06/04/2008  . DIARRHEA, CHRONIC 04/22/2008  . Low back pain 08/03/2007  . Chronic maxillary sinusitis 04/20/2007  . Hypothyroidism 02/26/2007  . HYPERLIPIDEMIA 02/26/2007  . ANXIETY 02/26/2007  . Adjustment disorder with mixed anxiety and depressed mood 02/26/2007  . TARDIVE DYSKINESIA 02/26/2007  . GLAUCOMA 02/26/2007  . EUSTACHIAN TUBE DYSFUNCTION 02/26/2007  . Essential hypertension 02/26/2007  . MITRAL VALVE PROLAPSE 02/26/2007  . ALLERGIC RHINITIS 02/26/2007  . GERD 02/26/2007  . HIATAL HERNIA 02/26/2007  . OSTEOPOROSIS 02/26/2007    Earlie Counts, PT Feb 29, 2016 11:42 AM   [facility] [address] [place], [place], [address] Phone: [phone]   Fax:  [phone]  Name: [name] MRN: [id number] Date of Birth: April 09, 1941   PHYSICAL THERAPY DISCHARGE SUMMARY  Visits from Start of Care: 10  Current functional level related to goals /  functional outcomes: Patient has not met goals due to the increase in left knee pain. She is having surgery to left knee on 02/15/2016. Patient was feeling disoriented today.    Remaining deficits: See above.   Education / Equipment: HEP Plan: Patient agrees to discharge.  Patient goals were not met. Patient is being discharged due to a change in medical status. Thank you for the referral. Earlie Counts, PT 2016/02/29 11:42 AM   ?????

## 2016-02-09 ENCOUNTER — Telehealth: Payer: Self-pay | Admitting: Oncology

## 2016-02-09 ENCOUNTER — Other Ambulatory Visit (HOSPITAL_BASED_OUTPATIENT_CLINIC_OR_DEPARTMENT_OTHER): Payer: Medicare Other

## 2016-02-09 ENCOUNTER — Ambulatory Visit (HOSPITAL_BASED_OUTPATIENT_CLINIC_OR_DEPARTMENT_OTHER): Payer: Medicare Other | Admitting: Oncology

## 2016-02-09 VITALS — BP 162/59 | HR 59 | Temp 97.8°F | Resp 17 | Ht 61.0 in | Wt 151.2 lb

## 2016-02-09 DIAGNOSIS — Z8507 Personal history of malignant neoplasm of pancreas: Secondary | ICD-10-CM

## 2016-02-09 DIAGNOSIS — C250 Malignant neoplasm of head of pancreas: Secondary | ICD-10-CM | POA: Diagnosis not present

## 2016-02-09 NOTE — Telephone Encounter (Signed)
Avs report and schedule given per 02/09/16 los. °

## 2016-02-09 NOTE — Progress Notes (Signed)
  [place] OFFICE PROGRESS NOTE   Diagnosis: Pancreas cancer  INTERVAL HISTORY:   Ms. Ginger returns as scheduled. She feels well. Good appetite. She reports a recurring left knee effusion and is scheduled for arthroscopic surgery next week. She underwent a left breast biopsy by Dr. [name] on 12/15/2015. The pathology revealed no evidence of atypia or malignancy. There were fibrocystic changes with associated calcifications.    Objective:  Vital signs in last 24 hours:  Blood pressure (!) 162/59, pulse (!) 59, temperature 97.8 F (36.6 C), temperature source Oral, resp. rate 17, height 5\' 1"  (1.549 m), weight 151 lb 3.2 oz (68.6 kg), SpO2 97 %.    HEENT: Neck without mass Lymphatics: No cervical, supraclavicular, axillary, or inguinal nodes Resp: Lungs clear bilaterally Cardio: Regular rate and rhythm GI: No hepatosplenomegaly, no mass Vascular: The left lower leg is slightly larger than the right side with faint erythema inferior to a left knee brace, no tenderness Musculoskeletal: There is a left knee effusion     Lab Results: CA 19-9 on 08/03/1998 17-3  Medications: I have reviewed the patient's current medications.  Assessment/Plan: 1. 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  CT abdomen/pelvis 01/19/2015 with no evidence of recurrent/metastatic disease.  2. Nausea following the Whipple procedure-improved 3. Mild thrombocytopenia , Chronic    Disposition:  Ms. Blaes remains in clinical remission from pancreas cancer. We will follow-up on the CA 19-9 from today. She will return for an office visit in 6  months.  Betsy Coder, MD  02/09/2016  10:27 AM

## 2016-02-10 ENCOUNTER — Telehealth: Payer: Self-pay | Admitting: *Deleted

## 2016-02-10 LAB — CANCER ANTIGEN 19-9: CAN 19-9: 1 U/mL (ref 0–35)

## 2016-02-10 LAB — CANCER ANTIGEN 19-9 (PARALLEL TESTING): CA 19 9: 10 U/mL (ref <34)

## 2016-02-10 NOTE — Telephone Encounter (Signed)
Notified pt that CA19-9 is normal. She voiced understanding. Pt asked what other labs were done, "they took several vials." Informed her CA19-9 was run at 2 different labs.

## 2016-02-10 NOTE — Telephone Encounter (Signed)
-----   Message from Ladell Pier, MD sent at 02/10/2016 11:17 AM EDT ----- Please call patient, ca19-9 is normal

## 2016-02-11 ENCOUNTER — Telehealth: Payer: Self-pay | Admitting: Oncology

## 2016-02-11 NOTE — Telephone Encounter (Signed)
Faxed records to [facility] of [place] [phone]

## 2016-02-15 ENCOUNTER — Other Ambulatory Visit: Payer: Self-pay

## 2016-02-15 DIAGNOSIS — G8918 Other acute postprocedural pain: Secondary | ICD-10-CM | POA: Diagnosis not present

## 2016-02-15 DIAGNOSIS — M65862 Other synovitis and tenosynovitis, left lower leg: Secondary | ICD-10-CM | POA: Diagnosis not present

## 2016-02-15 DIAGNOSIS — M12262 Villonodular synovitis (pigmented), left knee: Secondary | ICD-10-CM | POA: Diagnosis not present

## 2016-02-22 ENCOUNTER — Telehealth: Payer: Self-pay

## 2016-02-22 ENCOUNTER — Other Ambulatory Visit: Payer: Self-pay | Admitting: Neurology

## 2016-02-22 NOTE — Telephone Encounter (Signed)
Fine to trial allegra

## 2016-02-22 NOTE — Telephone Encounter (Signed)
Spoke with patient, she had called today stating her loratadine 10 mg has not help with her allergy sx. Has been referred by family and friends to take Allegra but is concern if it will interact with her medication she is taking now. Please advise if she is able to take the [place] or is requesting a Rx she can take for her allergy sx.

## 2016-02-22 NOTE — Telephone Encounter (Signed)
Spoke to patient who verbalized understanding and will trial 1 Allegra a day.

## 2016-02-25 ENCOUNTER — Telehealth: Payer: Self-pay | Admitting: Neurology

## 2016-02-25 ENCOUNTER — Other Ambulatory Visit: Payer: Self-pay | Admitting: *Deleted

## 2016-02-25 MED ORDER — PREGABALIN 150 MG PO CAPS: ORAL_CAPSULE | ORAL | 0 refills | Status: DC

## 2016-02-25 NOTE — Telephone Encounter (Signed)
Spoke to Dr. Krista Blue - she has provided patient rx for Lyrica 150mg  BID and 300mg  QHS.  Patient agreeable to this increase and rx faxed to the pharmacy.  She has a follow up schedule in three weeks.

## 2016-02-25 NOTE — Telephone Encounter (Addendum)
Pt called to advise she needs refill for pregabalin (LYRICA) 150 MG capsule . She is requesting it be increased, she said it is not helping as much now. Pt sts she will be out of the medication in on Tuesday. Pls send to [facility]

## 2016-02-29 ENCOUNTER — Telehealth: Payer: Self-pay | Admitting: Neurology

## 2016-02-29 NOTE — Telephone Encounter (Signed)
An appt has been scheduled with Dr. Jannifer Franklin.

## 2016-02-29 NOTE — Telephone Encounter (Signed)
Returned call to patient - her insurance only allows 90 tablets per 30 days.  I have called [employer] at [phone] (pt [id number]) and initiated a quantity override.  Her case has to be reviewed and can take 24-72 hours for a determination.  She is aware of the status.

## 2016-02-29 NOTE — Telephone Encounter (Signed)
Patient called requesting to switch from Dr. Krista Blue to Dr. Jannifer Franklin, states she saw Dr. Jannifer Franklin once when Dr. Krista Blue was on vacation. Please call to advise [phone].

## 2016-02-29 NOTE — Telephone Encounter (Signed)
Patient called to advise, [employer] has refused to pay for [facility], states it has to have Doctor's approval. Please call patient [phone].

## 2016-02-29 NOTE — Telephone Encounter (Signed)
Okay with me for switch. 

## 2016-02-29 NOTE — Telephone Encounter (Signed)
It is ok to switch 

## 2016-03-01 ENCOUNTER — Other Ambulatory Visit: Payer: Self-pay | Admitting: *Deleted

## 2016-03-01 MED ORDER — PREGABALIN 300 MG PO CAPS: 300.0000 mg | ORAL_CAPSULE | Freq: Two times a day (BID) | ORAL | 0 refills | Status: DC

## 2016-03-01 NOTE — Telephone Encounter (Signed)
Unable to get the quantity below approved.  Per Dr. Krista Blue, provide new rx for Lyica 300mg , BID.  Pt agreeable to this change.  Rx faxed to pharmacy.  She will keep her pending appt w/ Dr. Jannifer Franklin.

## 2016-03-09 ENCOUNTER — Other Ambulatory Visit: Payer: Self-pay | Admitting: Family Medicine

## 2016-03-14 DIAGNOSIS — F332 Major depressive disorder, recurrent severe without psychotic features: Secondary | ICD-10-CM | POA: Diagnosis not present

## 2016-03-16 ENCOUNTER — Ambulatory Visit: Payer: Self-pay | Admitting: Neurology

## 2016-03-17 ENCOUNTER — Ambulatory Visit (INDEPENDENT_AMBULATORY_CARE_PROVIDER_SITE_OTHER): Payer: Medicare Other | Admitting: Neurology

## 2016-03-17 ENCOUNTER — Telehealth: Payer: Self-pay | Admitting: Neurology

## 2016-03-17 ENCOUNTER — Encounter: Payer: Self-pay | Admitting: Neurology

## 2016-03-17 ENCOUNTER — Telehealth: Payer: Self-pay | Admitting: Family Medicine

## 2016-03-17 ENCOUNTER — Ambulatory Visit: Payer: Medicare Other | Admitting: Neurology

## 2016-03-17 VITALS — BP 173/80 | HR 67 | Ht 61.0 in | Wt 155.0 lb

## 2016-03-17 DIAGNOSIS — R269 Unspecified abnormalities of gait and mobility: Secondary | ICD-10-CM

## 2016-03-17 DIAGNOSIS — G609 Hereditary and idiopathic neuropathy, unspecified: Secondary | ICD-10-CM

## 2016-03-17 MED ORDER — BACLOFEN 10 MG PO TABS: ORAL_TABLET | ORAL | 0 refills | Status: DC

## 2016-03-17 MED ORDER — PREGABALIN 300 MG PO CAPS: 300.0000 mg | ORAL_CAPSULE | Freq: Two times a day (BID) | ORAL | 5 refills | Status: DC

## 2016-03-17 MED ORDER — DULOXETINE HCL 30 MG PO CPEP: 30.0000 mg | ORAL_CAPSULE | Freq: Two times a day (BID) | ORAL | 3 refills | Status: DC

## 2016-03-17 NOTE — Telephone Encounter (Signed)
I called patient. The patient has been on baclofen for several months, it is not clear why she is on the medication, I would have her taper off the drug, we will have her go to 5 mg 3 times daily for 2 weeks, then stop the medication. A prescription was called in for this.

## 2016-03-17 NOTE — Progress Notes (Signed)
Reason for visit: Peripheral neuropathy  [name] is an 75 y.o. female  History of present illness:  [name] is a 75 year old right-handed white female with a history of a small fiber neuropathy. The patient has had ongoing uncontrollable discomfort in the feet that has not responded to relatively high-dose Lyrica, and the use of Trileptal. The patient has pain from the feet up to the knees bilaterally. The patient also has restless leg syndrome that is relatively well controlled on Requip. The patient indicates that she has never been on Cymbalta. She walks with a cane, she does have some mild gait instability, she has not had any falls since last seen.  Past Medical History:  Diagnosis Date  . Allergic rhinitis   . Cancer ([place]) 07/10/13   Pancreatic cancer with MRI scan 06-19-13  . Chronic maxillary sinusitis    neti pot  . Depression    alone a lot  . Eustachian tube dysfunction   . GERD (gastroesophageal reflux disease)   . Glaucoma   . Heart murmur    hx. "MVP" -predental antibiotics  . Hiatal hernia   . Hypertension   . Hypothyroid   . Memory loss    short term memory loss  . Mitral valve prolapse    antibiotics before dental procedures  . Osteoarthritis   . Stroke [facility])    mini storkes left leg paraylsis. patient denies weakness 01/08/14.   . Tardive dyskinesia    possibly reglan, vitamin E helps  . Urgency of urination    some UTI in past    Past Surgical History:  Procedure Laterality Date  . 1 baker cyst removed    . ABDOMINAL HYSTERECTOMY     including ovaries  . BACK SURGERY     fusion  . BLEPHAROPLASTY Bilateral    with cataract surgery  . BREAST SURGERY     Biopsy left 2 times  . COLONOSCOPY W/ POLYPECTOMY     2004 last colonoscopy, no polyps  . DILATION AND CURETTAGE OF UTERUS     x3  . ESOPHAGOGASTRODUODENOSCOPY N/A 09/11/2013   Procedure: ESOPHAGOGASTRODUODENOSCOPY (EGD);  Surgeon: Cleotis Nipper, MD;  Location: [facility] ENDOSCOPY;   Service: Endoscopy;  Laterality: N/A;  Moderate sedation okay if MAC not available  . EUS N/A 07/10/2013   Procedure: ESOPHAGEAL ENDOSCOPIC ULTRASOUND (EUS) RADIAL;  Surgeon: Arta Silence, MD;  Location: [facility] ENDOSCOPY;  Service: Endoscopy;  Laterality: N/A;  . EYE SURGERY Right    cataract  . FINE NEEDLE ASPIRATION N/A 07/10/2013   Procedure: FINE NEEDLE ASPIRATION (FNA) LINEAR;  Surgeon: Arta Silence, MD;  Location: [facility] ENDOSCOPY;  Service: Endoscopy;  Laterality: N/A;  possible fna  . JOINT REPLACEMENT     LTKA  . KNEE SURGERY Left    x 5, total knee Left knee  . LAPAROSCOPY N/A 08/07/2013   Procedure: LAPAROSCOPY DIAGNOSTIC;  Surgeon: [name], MD;  Location: [place];  Service: General;  Laterality: N/A;  . LUMBAR SPINE SURGERY     x2  . LUMBAR SPINE SURGERY     cyst  . ORIF ANKLE FRACTURE Right 08/16/2015   Procedure: OPEN REDUCTION INTERNAL FIXATION (ORIF) ANKLE FRACTURE;  Surgeon: Meredith Pel, MD;  Location: [place];  Service: Orthopedics;  Laterality: Right;  . RADIOACTIVE SEED GUIDED EXCISIONAL BREAST BIOPSY Left 12/15/2015   Procedure: LEFT RADIOACTIVE SEED GUIDED EXCISIONAL BREAST BIOPSY;  Surgeon: [name], MD;  Location: [place];  Service: General;  Laterality: Left;  . WHIPPLE  PROCEDURE N/A 08/07/2013   Procedure: WHIPPLE PROCEDURE;  Surgeon: [name], MD;  Location: [facility] OR;  Service: General;  Laterality: N/A;    Family History  Problem Relation Age of Onset  . Cancer Mother     Breast Cancer with Metastatic disease  . Alzheimer's disease Father   . Other Brother 28    GSW    Social history:  reports that she has never smoked. She has never used smokeless tobacco. She reports that she does not drink alcohol or use drugs.    Allergies  Allergen Reactions  . Other Other (See Comments)    According to patient Trilafor and Reglan cause Tardive Dyskinesia  . Metoclopramide Hcl Other (See Comments)    Shaking; caused tardive dyskinesia  . Niacin Other (See  Comments)    shaking  . Trovan [Alatrofloxacin Mesylate] Other (See Comments)    Caused shaking and nervousness  . Benzocaine-Resorcinol Rash  . Celecoxib Other (See Comments)    unknown  . Erythromycin Base Rash  . Glucosamine Other (See Comments)    unknown  . Nortriptyline Other (See Comments)    Dizziness   . Phenazopyridine Hcl Other (See Comments)    unknown  . Sulfa Antibiotics Nausea And Vomiting and Rash  . Sulfonamide Derivatives Nausea And Vomiting and Rash    Medications:  Prior to Admission medications   Medication Sig Start Date End Date Taking? Authorizing Provider  aspirin 81 MG tablet Take 81 mg by mouth daily.   Yes Historical Provider, MD  atenolol (TENORMIN) 25 MG tablet TAKE 1 TABLET BY MOUTH  EVERY MORNING 03/10/16  Yes Marin Olp, MD  baclofen (LIORESAL) 10 MG tablet Take 1 tablet (10 mg total) by mouth 3 (three) times daily. 08/19/15  Yes Velvet Bathe, MD  busPIRone (BUSPAR) 15 MG tablet Take 15 mg by mouth at bedtime.   Yes Historical Provider, MD  Calcium Carbonate (CALCIUM 600 PO) Take 1 tablet by mouth daily.   Yes Historical Provider, MD  carboxymethylcellulose (REFRESH TEARS) 0.5 % SOLN Place 1 drop into both eyes 2 (two) times daily.    Yes Historical Provider, MD  cholecalciferol (VITAMIN D) 1000 UNITS tablet Take 1,000 Units by mouth 2 (two) times daily.    Yes Historical Provider, MD  cyanocobalamin 2000 MCG tablet Take 2,000 mcg by mouth daily at 12 noon.    Yes Historical Provider, MD  docusate sodium (COLACE) 100 MG capsule Take 200 mg by mouth at bedtime.   Yes Historical Provider, MD  feeding supplement (BOOST HIGH PROTEIN) LIQD Take 1 Container by mouth daily.   Yes Historical Provider, MD  FLUoxetine (PROZAC) 20 MG capsule Take 1 capsule (20 mg total) by mouth daily. 11/17/14  Yes Marin Olp, MD  furosemide (LASIX) 20 MG tablet Take 1 tablet (20 mg total) by mouth daily. 08/06/15  Yes Isaiah Serge, NP  HYDROcodone-acetaminophen  (NORCO/VICODIN) 5-325 MG tablet Take 1-2 tablets by mouth every 6 (six) hours as needed for moderate pain. 12/15/15  Yes [name], MD  ibuprofen (ADVIL,MOTRIN) 200 MG tablet Take 200 mg by mouth every 6 (six) hours as needed for moderate pain.   Yes Historical Provider, MD  levothyroxine (SYNTHROID, LEVOTHROID) 175 MCG tablet Take 1 tablet by mouth  daily before breakfast 01/25/16  Yes Marin Olp, MD  lipase/protease/amylase (CREON) 12000 UNITS CPEP capsule Take 2 capsules (24,000 Units total) by mouth 3 (three) times daily before meals. Patient taking differently: Take 12,000 Units by mouth  3 (three) times daily before meals.  05/25/14  Yes Michael Boston, MD  loratadine (CLARITIN) 10 MG tablet Take 10 mg by mouth daily.   Yes Historical Provider, MD  lovastatin (MEVACOR) 40 MG tablet TAKE 1 TABLET BY MOUTH AT  BEDTIME 03/10/16  Yes Marin Olp, MD  Multiple Vitamin (MULTIVITAMIN WITH MINERALS) TABS tablet Take 1 tablet by mouth daily.   Yes Historical Provider, MD  omeprazole (PRILOSEC OTC) 20 MG tablet Take 20 mg by mouth 2 (two) times daily.    Yes Historical Provider, MD  OXcarbazepine (TRILEPTAL) 150 MG tablet One tablet in the morning and 3 in the evening 12/29/15  Yes Kathrynn Ducking, MD  polycarbophil (FIBERCON) 625 MG tablet Take 625 mg by mouth daily.    Yes Historical Provider, MD  potassium chloride (K-DUR) 10 MEQ tablet Take 1 tablet (10 mEq total) by mouth daily. 08/06/15  Yes Isaiah Serge, NP  pregabalin (LYRICA) 300 MG capsule Take 1 capsule (300 mg total) by mouth 2 (two) times daily. 03/01/16  Yes Marcial Pacas, MD  promethazine (PHENERGAN) 25 MG tablet Take 25 mg by mouth every 6 (six) hours as needed for nausea or vomiting.   Yes Historical Provider, MD  psyllium (REGULOID) 0.52 g capsule Take 0.52 g by mouth daily.   Yes Historical Provider, MD  rOPINIRole (REQUIP) 0.5 MG tablet Take 1 tablet (0.5 mg total) by mouth at bedtime. 05/11/15  Yes Dennie Bible, NP    simethicone (MYLICON) 0000000 MG chewable tablet Chew 125 mg by mouth every 6 (six) hours as needed for flatulence.   Yes Historical Provider, MD  vitamin E 400 UNIT capsule Take 800 Units by mouth 2 (two) times daily. For tartive dyskinesia   Yes Historical Provider, MD    ROS:  Out of a complete 14 system review of symptoms, the patient complains only of the following symptoms, and all other reviewed systems are negative.  Decreased appetite, weight gain, excessive swelling Hearing loss, ear pain, ringing in the ears, runny nose Eye itching, double vision, blurred vision Chest tightness Leg swelling Cold intolerance, heat intolerance, excessive thirst, excessive eating Snoring Difficulty urinating  Blood pressure (!) 173/80, pulse 67, height 5\' 1"  (1.549 m), weight 155 lb (70.3 kg).  Physical Exam  General: The patient is alert and cooperative at the time of the examination.  Skin: No significant peripheral edema is noted.   Neurologic Exam  Mental status: The patient is alert and oriented x 3 at the time of the examination. The patient has apparent normal recent and remote memory, with an apparently normal attention span and concentration ability.   Cranial nerves: Facial symmetry is present. Speech is normal, no aphasia or dysarthria is noted. Extraocular movements are full. Visual fields are full.  Motor: The patient has good strength in all 4 extremities.  Sensory examination: Soft touch sensation is symmetric on the face, arms, and legs.  Coordination: The patient has good finger-nose-finger and heel-to-shin bilaterally.  Gait and station: The patient has a normal gait. Tandem gait is slightly unsteady. Romberg is negative. No drift is seen.  Reflexes: Deep tendon reflexes are symmetric.   Assessment/Plan:  1. Peripheral neuropathy, small fiber  2. Gait disorder  The patient is having ongoing significant discomfort with the peripheral neuropathy, she is not  sleeping well at night because of this. She has not responded to Lyrica and Trileptal. We will taper her off of Trileptal by 150 mg every week until off the  drug. She will be maintained on Lyrica. The patient will be placed on Cymbalta working up to 30 mg twice daily. She will follow-up in 4 months, sooner if needed.  Jill Alexanders MD 03/17/2016 1:29 PM  [facility] [address] [place], [place] [address]  Phone [phone] Fax [phone]

## 2016-03-17 NOTE — Telephone Encounter (Signed)
Spoke to pt. Says that she filled Baclofen last in August. Asking if she is to continue this medication. If so, she has no remaining refills.

## 2016-03-17 NOTE — Patient Instructions (Addendum)
   With the oxcarbazepine (Trileptal) 150 mg tablet, taper off of the medication by one tablet every week until off.   With the Cymbalta 30 mg tablet, start one a day for 2 weeks, then take 1 twice a day.

## 2016-03-17 NOTE — Telephone Encounter (Signed)
Patient called, states she saw Dr. Jannifer Franklin this afternoon and forgot to ask about baclofen (LIORESAL) 10 MG tablet, states pharmacist wasn't sure that she still needed to be on this medication.

## 2016-03-17 NOTE — Telephone Encounter (Signed)
Spoke with Patient as her son is not on her DPR and I can not talk to him. It looks like she hasn't been on Lasix in quite a while. She states she just found an older bottle and wanted to know if she start taking it. I told her unless a physician had advised her to start back taking it she should not start back.

## 2016-03-17 NOTE — Telephone Encounter (Signed)
Son would like to know if pt is supposed to be on furosemide (LASIX) 20 MG tablet

## 2016-03-22 ENCOUNTER — Ambulatory Visit (INDEPENDENT_AMBULATORY_CARE_PROVIDER_SITE_OTHER): Payer: Medicare Other | Admitting: Orthopaedic Surgery

## 2016-03-22 ENCOUNTER — Encounter (INDEPENDENT_AMBULATORY_CARE_PROVIDER_SITE_OTHER): Payer: Self-pay | Admitting: Orthopaedic Surgery

## 2016-03-22 ENCOUNTER — Other Ambulatory Visit: Payer: Self-pay | Admitting: Neurology

## 2016-03-22 VITALS — BP 131/73 | HR 71 | Ht 61.0 in | Wt 150.0 lb

## 2016-03-22 DIAGNOSIS — M12262 Villonodular synovitis (pigmented), left knee: Secondary | ICD-10-CM

## 2016-03-22 MED ORDER — LIDOCAINE HCL 1 % IJ SOLN
1.0000 mL | INTRAMUSCULAR | Status: AC | PRN
  Administered 2016-03-22: 1 mL

## 2016-03-22 MED ORDER — HYDROCODONE-ACETAMINOPHEN 5-325 MG PO TABS: 1.0000 | ORAL_TABLET | Freq: Four times a day (QID) | ORAL | 0 refills | Status: DC | PRN

## 2016-03-22 NOTE — Progress Notes (Signed)
Office Visit Note   Patient: [name]           Date of Birth: [birth year]           MRN: [id number] Visit Date: 03/22/2016              Requested by: Marin Olp, MD [place], [place] [id number] PCP: [name], MD   Assessment & Plan: Visit Diagnoses:  1. Pigmented villonodular synovitis of knee, left     Plan: Continue conservative treatment recheck to 4 weeks. Follow-Up Instructions: No Follow-up on file.   Orders:  No orders of the defined types were placed in this encounter.  No orders of the defined types were placed in this encounter.     Procedures: Large Joint Inj Date/Time: 03/22/2016 12:07 PM Performed by: [name] Authorized by: Rodell Perna C   Location:  Knee Needle Size:  18 G Approach:  Superolateral Ultrasound Guidance: No   Fluoroscopic Guidance: No  no Medications:  1 mL lidocaine 1 % Aspiration Attempted: Yes   Aspirate amount (mL):  50 Aspirate:  Bloody     Clinical Data: No additional findings.   Subjective: Chief Complaint  Patient presents with  . Left Knee - Routine Post Op  . Routine Post Op    left knee scope and arthroscopy synvectomy 02/15/16    Patients returns in office today for follow up on left knee arthroscopy/ synovectomy performed on 02/15/2016. She is currently 5 weeks post op. She states as of yesterday evening she has had acute onset of pain. She was unable to bear any weight on left knee or straighten leg at the time of onset. She has had increase in swelling she is currently using an ace bandage for compression. She is taking hydrocodone for her pain and states she is having to take two tablets for relief. She has not had to take hydrocodone this frequently prior to yesterday evening. She is doing slightly better this morning. She ambulates with a cane today, and with a walker in her home. She has prior hx of multiple aspirations to this left knee.    Review of Systems no  change   Objective: Vital Signs: BP 131/73 (BP Location: Left Arm, Patient Position: Sitting)   Pulse 71   Ht 5\' 1"  (1.549 m)   Wt 150 lb (68 kg)   BMI 28.34 kg/m   Physical Exam no change except left knee  Ortho Exam left knee is tense arthroscope portals look good no cellulitis no increased warmth she is tender throughout the knee all sides. Limited flexion 80 she does reach full extension no palpable quad defects. Patella is midline.  Specialty Comments:  No specialty comments available.  Imaging: No results found.   PMFS History: Patient Active Problem List   Diagnosis Date Noted  . Pigmented villonodular synovitis of knee, left 03/22/2016  . Abnormal radionuclide bone scan 08/16/2015  . Ankle fracture, bimalleolar, closed 08/16/2015  . Central chest pain 07/28/2015  . HLD (hyperlipidemia) 07/28/2015  . Fatty liver 09/09/2014  . Lumbago 07/07/2014  . Abnormality of gait 03/27/2014  . Memory loss 01/08/2014  . 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 Other abnormal glucose 11/29/2013  . Carcinoma of head of pancreas ([place]) 07/01/2013  . 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 Paresthesia of foot 06/06/2012  . Syncope 09/26/2011  . RHINOSINUSITIS, CHRONIC 02/03/2009  . COLONIC POLYPS 06/05/2008  . OSTEOARTHRITIS 06/04/2008  . DIARRHEA, CHRONIC  04/22/2008  . Low back pain 08/03/2007  . Chronic maxillary sinusitis 04/20/2007  . Hypothyroidism 02/26/2007  . HYPERLIPIDEMIA 02/26/2007  . ANXIETY 02/26/2007  . Adjustment disorder with mixed anxiety and depressed mood 02/26/2007  . TARDIVE DYSKINESIA 02/26/2007  . GLAUCOMA 02/26/2007  . EUSTACHIAN TUBE DYSFUNCTION 02/26/2007  . Essential hypertension 02/26/2007  . MITRAL VALVE PROLAPSE 02/26/2007  . ALLERGIC RHINITIS 02/26/2007  . GERD 02/26/2007  . HIATAL HERNIA 02/26/2007  . OSTEOPOROSIS 02/26/2007   Past Medical History:  Diagnosis Date  . Allergic rhinitis   . Cancer ([place])  07/10/13   Pancreatic cancer with MRI scan 06-19-13  . Chronic maxillary sinusitis    neti pot  . Depression    alone a lot  . Eustachian tube dysfunction   . GERD (gastroesophageal reflux disease)   . Glaucoma   . Heart murmur    hx. "MVP" -predental antibiotics  . Hiatal hernia   . Hypertension   . Hypothyroid   . Memory loss    short term memory loss  . Mitral valve prolapse    antibiotics before dental procedures  . Osteoarthritis   . Stroke [facility])    mini storkes left leg paraylsis. patient denies weakness 01/08/14.   . Tardive dyskinesia    possibly reglan, vitamin E helps  . Urgency of urination    some UTI in past    Family History  Problem Relation Age of Onset  . Cancer Mother     Breast Cancer with Metastatic disease  . Alzheimer's disease Father   . Other Brother 24    GSW    Past Surgical History:  Procedure Laterality Date  . 1 baker cyst removed    . ABDOMINAL HYSTERECTOMY     including ovaries  . BACK SURGERY     fusion  . BLEPHAROPLASTY Bilateral    with cataract surgery  . BREAST SURGERY     Biopsy left 2 times  . COLONOSCOPY W/ POLYPECTOMY     2004 last colonoscopy, no polyps  . DILATION AND CURETTAGE OF UTERUS     x3  . ESOPHAGOGASTRODUODENOSCOPY N/A 09/11/2013   Procedure: ESOPHAGOGASTRODUODENOSCOPY (EGD);  Surgeon: Cleotis Nipper, MD;  Location: [facility] ENDOSCOPY;  Service: Endoscopy;  Laterality: N/A;  Moderate sedation okay if MAC not available  . EUS N/A 07/10/2013   Procedure: ESOPHAGEAL ENDOSCOPIC ULTRASOUND (EUS) RADIAL;  Surgeon: Arta Silence, MD;  [facility] ENDOSCOPY;  Service: Endoscopy;  Laterality: N/A;  . EYE SURGERY Right    cataract  . FINE NEEDLE ASPIRATION N/A 07/10/2013   Procedure: FINE NEEDLE ASPIRATION (FNA) LINEAR;  Surgeon: Arta Silence, MD;  [facility] ENDOSCOPY;  Service: Endoscopy;  Laterality: N/A;  possible fna  . JOINT REPLACEMENT     LTKA  . KNEE SURGERY Left    x 5, total knee Left knee  . LAPAROSCOPY N/A  08/07/2013   Procedure: LAPAROSCOPY DIAGNOSTIC;  Surgeon: [name], MD;  Location: [place];  Service: General;  Laterality: N/A;  . LUMBAR SPINE SURGERY     x2  . LUMBAR SPINE SURGERY     cyst  . ORIF ANKLE FRACTURE Right 08/16/2015   Procedure: OPEN REDUCTION INTERNAL FIXATION (ORIF) ANKLE FRACTURE;  Surgeon: Meredith Pel, MD;  Location: [place];  Service: Orthopedics;  Laterality: Right;  . RADIOACTIVE SEED GUIDED EXCISIONAL BREAST BIOPSY Left 12/15/2015   Procedure: LEFT RADIOACTIVE SEED GUIDED EXCISIONAL BREAST BIOPSY;  Surgeon: [name], MD;  Location: [place];  Service: General;  Laterality: Left;  .  WHIPPLE PROCEDURE N/A 08/07/2013   Procedure: WHIPPLE PROCEDURE;  Surgeon: [name], MD;  Location: [place];  Service: General;  Laterality: N/A;   Social History   Occupational History  .  Retired  . Retired    Social History Main Topics  . Smoking status: Never Smoker  . Smokeless tobacco: Never Used  . Alcohol use No  . Drug use: No  . Sexual activity: Not Currently

## 2016-03-22 NOTE — Addendum Note (Signed)
Addended by: [name] on: 03/22/2016 12:17 PM   Modules accepted: Orders

## 2016-03-23 ENCOUNTER — Ambulatory Visit: Payer: Medicare Other | Admitting: Family Medicine

## 2016-03-25 ENCOUNTER — Ambulatory Visit (INDEPENDENT_AMBULATORY_CARE_PROVIDER_SITE_OTHER): Payer: Medicare Other | Admitting: Emergency Medicine

## 2016-03-25 DIAGNOSIS — Z23 Encounter for immunization: Secondary | ICD-10-CM | POA: Diagnosis not present

## 2016-04-04 ENCOUNTER — Other Ambulatory Visit: Payer: Self-pay

## 2016-04-04 MED ORDER — DULOXETINE HCL 30 MG PO CPEP: 30.0000 mg | ORAL_CAPSULE | Freq: Two times a day (BID) | ORAL | 3 refills | Status: DC

## 2016-04-04 NOTE — Telephone Encounter (Signed)
90 day rx e-scribed per faxed request from pharmacy. 

## 2016-04-05 ENCOUNTER — Other Ambulatory Visit: Payer: Self-pay | Admitting: *Deleted

## 2016-04-05 MED ORDER — PREGABALIN 300 MG PO CAPS: 300.0000 mg | ORAL_CAPSULE | Freq: Two times a day (BID) | ORAL | 1 refills | Status: DC

## 2016-04-06 DIAGNOSIS — N3941 Urge incontinence: Secondary | ICD-10-CM | POA: Diagnosis not present

## 2016-04-13 ENCOUNTER — Ambulatory Visit (INDEPENDENT_AMBULATORY_CARE_PROVIDER_SITE_OTHER): Payer: Medicare Other

## 2016-04-13 ENCOUNTER — Encounter (INDEPENDENT_AMBULATORY_CARE_PROVIDER_SITE_OTHER): Payer: Self-pay | Admitting: Orthopaedic Surgery

## 2016-04-13 ENCOUNTER — Ambulatory Visit (INDEPENDENT_AMBULATORY_CARE_PROVIDER_SITE_OTHER): Payer: Medicare Other | Admitting: Orthopaedic Surgery

## 2016-04-13 VITALS — BP 137/82 | HR 70 | Ht 61.0 in | Wt 141.0 lb

## 2016-04-13 DIAGNOSIS — M533 Sacrococcygeal disorders, not elsewhere classified: Secondary | ICD-10-CM | POA: Diagnosis not present

## 2016-04-13 DIAGNOSIS — Z9889 Other specified postprocedural states: Secondary | ICD-10-CM

## 2016-04-13 NOTE — Progress Notes (Signed)
Office Visit Note   Patient: [name]           Date of Birth: 01-03-1941           MRN: [id number] Visit Date: 04/13/2016              Requested by: Marin Olp, MD [address] [place], [place] [address] PCP: [name], MD   Assessment & Plan: Visit Diagnoses:  1. Coccyx pain   2. S/P left knee arthroscopy     Plan: Patient doing well at this point. I gave her a prescription for TED hose to see if this will decrease the swelling in her left knee and lower leg. Advised that she likely has a bruised tailbone. States that this is getting better. Follow-up in 2 months for recheck but will return sooner if needed.  Follow-Up Instructions: Return in about 2 months (around 06/13/2016).   Orders:  Orders Placed This Encounter  Procedures  . XR Sacrum/Coccyx   No orders of the defined types were placed in this encounter.     Procedures: No procedures performed   Clinical Data: No additional findings.   Subjective: Chief Complaint  Patient presents with  . Left Knee - Follow-up  . Lower Back - Pain    Patient is here for four week ROV left knee. She is status post left knee arthroscopy and synovectomy on 02/15/16.  She is 8 weeks and 2 days out.  She also had aspiration on 03/22/16. She states that her knee is sore, but it is not swollen. She seems to be making progress with that. She did fall after she went home from 03/22/16 appt. She fell directly on her bottom. She complains of pain at the tailbone. She would like an xray of that today.  She is taking Ibuprofen for pain as needed.    Review of Systems  Constitutional: Negative.   HENT: Negative.   Respiratory: Negative.   Cardiovascular: Negative.   Gastrointestinal: Negative.   Genitourinary: Negative.   Musculoskeletal: Negative.   Skin: Negative.   Neurological: Negative.   Hematological: Negative.   Psychiatric/Behavioral: Negative.      Objective: Vital Signs: BP 137/82   Pulse  70   Ht 5\' 1"  (1.549 m)   Wt 141 lb (64 kg)   BMI 26.64 kg/m   Physical Exam  Constitutional: She is oriented to person, place, and time. She appears well-developed.  HENT:  Head: Normocephalic.  Eyes: Pupils are equal, round, and reactive to light.  Neck: Normal range of motion.  Abdominal: She exhibits no distension.  Neurological: She is alert and oriented to person, place, and time.    Ortho Exam Gait is normal. She does have some sacrococcygeal tenderness. Also somewhat tender over the bilateral SI joints. Lumbar spine nontender. Left knee she has good range of motion. She does have swelling maybe 2+ effusion. States that her knee swelling is much better from last office visit. Calf Nontender. Neurovascularly intact. Specialty Comments:  No specialty comments available.  Imaging: No results found.   PMFS History: Patient Active Problem List   Diagnosis Date Noted  . Pigmented villonodular synovitis of knee, left 03/22/2016  . Abnormal radionuclide bone scan 08/16/2015  . Ankle fracture, bimalleolar, closed 08/16/2015  . Central chest pain 07/28/2015  . HLD (hyperlipidemia) 07/28/2015  . Fatty liver 09/09/2014  . Lumbago 07/07/2014  . Abnormality of gait 03/27/2014  . Memory loss 01/08/2014  . 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  Other abnormal glucose 11/29/2013  . Carcinoma of head of pancreas ([place]) 07/01/2013  . 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 Paresthesia of foot 06/06/2012  . Syncope 09/26/2011  . RHINOSINUSITIS, CHRONIC 02/03/2009  . COLONIC POLYPS 06/05/2008  . OSTEOARTHRITIS 06/04/2008  . DIARRHEA, CHRONIC 04/22/2008  . Low back pain 08/03/2007  . Chronic maxillary sinusitis 04/20/2007  . Hypothyroidism 02/26/2007  . HYPERLIPIDEMIA 02/26/2007  . ANXIETY 02/26/2007  . Adjustment disorder with mixed anxiety and depressed mood 02/26/2007  . TARDIVE DYSKINESIA 02/26/2007  . GLAUCOMA 02/26/2007  . EUSTACHIAN TUBE  DYSFUNCTION 02/26/2007  . Essential hypertension 02/26/2007  . MITRAL VALVE PROLAPSE 02/26/2007  . ALLERGIC RHINITIS 02/26/2007  . GERD 02/26/2007  . HIATAL HERNIA 02/26/2007  . OSTEOPOROSIS 02/26/2007   Past Medical History:  Diagnosis Date  . Allergic rhinitis   . Cancer ([place]) 07/10/13   Pancreatic cancer with MRI scan 06-19-13  . Chronic maxillary sinusitis    neti pot  . Depression    alone a lot  . Eustachian tube dysfunction   . GERD (gastroesophageal reflux disease)   . Glaucoma   . Heart murmur    hx. "MVP" -predental antibiotics  . Hiatal hernia   . Hypertension   . Hypothyroid   . Memory loss    short term memory loss  . Mitral valve prolapse    antibiotics before dental procedures  . Osteoarthritis   . Stroke [facility])    mini storkes left leg paraylsis. patient denies weakness 01/08/14.   . Tardive dyskinesia    possibly reglan, vitamin E helps  . Urgency of urination    some UTI in past    Family History  Problem Relation Age of Onset  . Cancer Mother     Breast Cancer with Metastatic disease  . Alzheimer's disease Father   . Other Brother 76    GSW    Past Surgical History:  Procedure Laterality Date  . 1 baker cyst removed    . ABDOMINAL HYSTERECTOMY     including ovaries  . BACK SURGERY     fusion  . BLEPHAROPLASTY Bilateral    with cataract surgery  . BREAST SURGERY     Biopsy left 2 times  . COLONOSCOPY W/ POLYPECTOMY     2004 last colonoscopy, no polyps  . DILATION AND CURETTAGE OF UTERUS     x3  . ESOPHAGOGASTRODUODENOSCOPY N/A 09/11/2013   Procedure: ESOPHAGOGASTRODUODENOSCOPY (EGD);  Surgeon: Cleotis Nipper, MD;  Location: [facility] ENDOSCOPY;  Service: Endoscopy;  Laterality: N/A;  Moderate sedation okay if MAC not available  . EUS N/A 07/10/2013   Procedure: ESOPHAGEAL ENDOSCOPIC ULTRASOUND (EUS) RADIAL;  Surgeon: Arta Silence, MD;  Location: [facility] ENDOSCOPY;  Service: Endoscopy;  Laterality: N/A;  . EYE SURGERY Right    cataract  . FINE  NEEDLE ASPIRATION N/A 07/10/2013   Procedure: FINE NEEDLE ASPIRATION (FNA) LINEAR;  Surgeon: Arta Silence, MD;  Location: [facility] ENDOSCOPY;  Service: Endoscopy;  Laterality: N/A;  possible fna  . JOINT REPLACEMENT     LTKA  . KNEE SURGERY Left    x 5, total knee Left knee  . LAPAROSCOPY N/A 08/07/2013   Procedure: LAPAROSCOPY DIAGNOSTIC;  Surgeon: [name], MD;  Location: [place];  Service: General;  Laterality: N/A;  . LUMBAR SPINE SURGERY     x2  . LUMBAR SPINE SURGERY     cyst  . ORIF ANKLE FRACTURE Right 08/16/2015   Procedure: OPEN REDUCTION INTERNAL FIXATION (ORIF) ANKLE FRACTURE;  Surgeon:  Meredith Pel, MD;  Location: [place];  Service: Orthopedics;  Laterality: Right;  . RADIOACTIVE SEED GUIDED EXCISIONAL BREAST BIOPSY Left 12/15/2015   Procedure: LEFT RADIOACTIVE SEED GUIDED EXCISIONAL BREAST BIOPSY;  Surgeon: [name], MD;  Location: [place];  Service: General;  Laterality: Left;  . WHIPPLE PROCEDURE N/A 08/07/2013   Procedure: WHIPPLE PROCEDURE;  Surgeon: [name], MD;  Location: [place];  Service: General;  Laterality: N/A;   Social History   Occupational History  .  Retired  . Retired    Social History Main Topics  . Smoking status: Never Smoker  . Smokeless tobacco: Never Used  . Alcohol use No  . Drug use: No  . Sexual activity: Not Currently

## 2016-04-20 ENCOUNTER — Ambulatory Visit (INDEPENDENT_AMBULATORY_CARE_PROVIDER_SITE_OTHER): Payer: Medicare Other | Admitting: Orthopaedic Surgery

## 2016-04-26 ENCOUNTER — Ambulatory Visit (INDEPENDENT_AMBULATORY_CARE_PROVIDER_SITE_OTHER): Payer: Medicare Other | Admitting: Family Medicine

## 2016-04-26 ENCOUNTER — Encounter: Payer: Self-pay | Admitting: Family Medicine

## 2016-04-26 VITALS — BP 144/82 | HR 86 | Temp 98.1°F | Ht 61.0 in | Wt 149.8 lb

## 2016-04-26 DIAGNOSIS — J069 Acute upper respiratory infection, unspecified: Secondary | ICD-10-CM | POA: Diagnosis not present

## 2016-04-26 MED ORDER — GUAIFENESIN 200 MG PO TABS: 200.0000 mg | ORAL_TABLET | Freq: Four times a day (QID) | ORAL | 0 refills | Status: DC | PRN

## 2016-04-26 NOTE — Progress Notes (Signed)
HPI:  Acute visit for URI: -started: about 5-7 days ago -symptoms:nasal congestion, sore throat, drainage in throat, sinus pressure, HA, cough -denies:fever, SOB, NVD, tooth pain -has tried: nothing -sick contacts/travel/risks: no reported flu, strep or tick exposure -Hx of: allergies - untreated and chronic sinus issues  ROS: See pertinent positives and negatives per HPI.  Past Medical History:  Diagnosis Date  . Allergic rhinitis   . Cancer ([place]) 07/10/13   Pancreatic cancer with MRI scan 06-19-13  . Chronic maxillary sinusitis    neti pot  . Depression    alone a lot  . Eustachian tube dysfunction   . GERD (gastroesophageal reflux disease)   . Glaucoma   . Heart murmur    hx. "MVP" -predental antibiotics  . Hiatal hernia   . Hypertension   . Hypothyroid   . Memory loss    short term memory loss  . Mitral valve prolapse    antibiotics before dental procedures  . Osteoarthritis   . Stroke [facility])    mini storkes left leg paraylsis. patient denies weakness 01/08/14.   . Tardive dyskinesia    possibly reglan, vitamin E helps  . Urgency of urination    some UTI in past    Past Surgical History:  Procedure Laterality Date  . 1 baker cyst removed    . ABDOMINAL HYSTERECTOMY     including ovaries  . BACK SURGERY     fusion  . BLEPHAROPLASTY Bilateral    with cataract surgery  . BREAST SURGERY     Biopsy left 2 times  . COLONOSCOPY W/ POLYPECTOMY     2004 last colonoscopy, no polyps  . DILATION AND CURETTAGE OF UTERUS     x3  . ESOPHAGOGASTRODUODENOSCOPY N/A 09/11/2013   Procedure: ESOPHAGOGASTRODUODENOSCOPY (EGD);  Surgeon: Cleotis Nipper, MD;  Location: [facility] ENDOSCOPY;  Service: Endoscopy;  Laterality: N/A;  Moderate sedation okay if MAC not available  . EUS N/A 07/10/2013   Procedure: ESOPHAGEAL ENDOSCOPIC ULTRASOUND (EUS) RADIAL;  Surgeon: Arta Silence, MD;  Location: [facility] ENDOSCOPY;  Service: Endoscopy;  Laterality: N/A;  . EYE SURGERY Right    cataract  .  FINE NEEDLE ASPIRATION N/A 07/10/2013   Procedure: FINE NEEDLE ASPIRATION (FNA) LINEAR;  Surgeon: Arta Silence, MD;  Location: [facility] ENDOSCOPY;  Service: Endoscopy;  Laterality: N/A;  possible fna  . JOINT REPLACEMENT     LTKA  . KNEE SURGERY Left    x 5, total knee Left knee  . LAPAROSCOPY N/A 08/07/2013   Procedure: LAPAROSCOPY DIAGNOSTIC;  Surgeon: [name], MD;  Location: [place];  Service: General;  Laterality: N/A;  . LUMBAR SPINE SURGERY     x2  . LUMBAR SPINE SURGERY     cyst  . ORIF ANKLE FRACTURE Right 08/16/2015   Procedure: OPEN REDUCTION INTERNAL FIXATION (ORIF) ANKLE FRACTURE;  Surgeon: Meredith Pel, MD;  Location: [place];  Service: Orthopedics;  Laterality: Right;  . RADIOACTIVE SEED GUIDED EXCISIONAL BREAST BIOPSY Left 12/15/2015   Procedure: LEFT RADIOACTIVE SEED GUIDED EXCISIONAL BREAST BIOPSY;  Surgeon: [name], MD;  Location: [place];  Service: General;  Laterality: Left;  . WHIPPLE PROCEDURE N/A 08/07/2013   Procedure: WHIPPLE PROCEDURE;  Surgeon: [name], MD;  Location: [facility] OR;  Service: General;  Laterality: N/A;    Family History  Problem Relation Age of Onset  . Cancer Mother     Breast Cancer with Metastatic disease  . Alzheimer's disease Father   . Other Brother 8  GSW    Social History   Social History  . Marital status: Widowed    Spouse name: N/A  . Number of children: 2  . Years of education: 8   Occupational History  .  Retired  . Retired    Social History Main Topics  . Smoking status: Never Smoker  . Smokeless tobacco: Never Used  . Alcohol use No  . Drug use: No  . Sexual activity: Not Currently   Other Topics Concern  . 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  aspirin 81 MG tablet, Take 81 mg by mouth daily., Disp: , Rfl:  .  atenolol (TENORMIN) 25 MG tablet, TAKE 1 TABLET BY MOUTH  EVERY MORNING, Disp: 90 tablet, Rfl: 1 .  busPIRone (BUSPAR) 15 MG tablet, Take 15 mg by mouth at bedtime., Disp: , Rfl:  .  Calcium Carbonate (CALCIUM 600 PO), Take 1 tablet by mouth daily., Disp: , Rfl:  .  carboxymethylcellulose (REFRESH TEARS) 0.5 % SOLN, Place 1 drop into both eyes 2 (two) times daily. , Disp: , Rfl:  .  cholecalciferol (VITAMIN D) 1000 UNITS tablet, Take 1,000 Units by mouth 2 (two) times daily. , Disp: , Rfl:  .  cyanocobalamin 2000 MCG tablet, Take 2,000 mcg by mouth daily at 12 noon. , Disp: , Rfl:  .  docusate sodium (COLACE) 100 MG capsule, Take 200 mg by mouth at bedtime., Disp: , Rfl:  .  DULoxetine (CYMBALTA) 30 MG capsule, Take 1 capsule (30 mg total) by mouth 2 (two) times daily., Disp: 180 capsule, Rfl: 3 .  feeding supplement (BOOST HIGH PROTEIN) LIQD, Take 1 Container by mouth daily., Disp: , Rfl:  .  FLUoxetine (PROZAC) 20 MG capsule, Take 1 capsule (20 mg total) by mouth daily., Disp: 90 capsule, Rfl: 2 .  furosemide (LASIX) 20 MG tablet, Take 1 tablet (20 mg total) by mouth daily., Disp: 90 tablet, Rfl: 3 .  HYDROcodone-acetaminophen (NORCO/VICODIN) 5-325 MG tablet, Take 1-2 tablets by mouth every 6 (six) hours as needed for moderate pain., Disp: 30 tablet, Rfl: 0 .  ibuprofen (ADVIL,MOTRIN) 200 MG tablet, Take 200 mg by mouth every 6 (six) hours as needed for moderate pain., Disp: , Rfl:  .  levothyroxine (SYNTHROID, LEVOTHROID) 175 MCG tablet, Take 1 tablet by mouth  daily before breakfast, Disp: 90 tablet, Rfl: 1 .  lipase/protease/amylase (CREON) 12000 UNITS CPEP capsule, Take 2 capsules (24,000 Units total) by mouth 3 (three)  times daily before meals. (Patient taking differently: Take 12,000 Units by mouth 3 (three) times daily before meals. ), Disp: 270 capsule, Rfl: 5 .  lovastatin (MEVACOR) 40 MG tablet, TAKE 1 TABLET BY MOUTH AT  BEDTIME, Disp: 90 tablet, Rfl: 1 .  Multiple Vitamin (MULTIVITAMIN WITH MINERALS) TABS tablet, Take 1 tablet by mouth daily., Disp: , Rfl:  .  omeprazole (PRILOSEC OTC) 20  MG tablet, Take 20 mg by mouth 2 (two) times daily. , Disp: , Rfl:  .  polycarbophil (FIBERCON) 625 MG tablet, Take 625 mg by mouth daily. , Disp: , Rfl:  .  potassium chloride (K-DUR) 10 MEQ tablet, Take 1 tablet (10 mEq total) by mouth daily., Disp: 90 tablet, Rfl: 3 .  pregabalin (LYRICA) 300 MG capsule, Take 1 capsule (300 mg total) by mouth 2 (two) times daily., Disp: 180 capsule, Rfl: 1 .  promethazine (PHENERGAN) 25 MG tablet, Take 25 mg by mouth every 6 (six) hours as needed for nausea or vomiting., Disp: , Rfl:  .  psyllium (REGULOID) 0.52 g capsule, Take 0.52 g by mouth daily., Disp: , Rfl:  .  rOPINIRole (REQUIP) 0.5 MG tablet, Take 1 tablet (0.5 mg total) by mouth at bedtime., Disp: 30 tablet, Rfl: 6 .  simethicone (MYLICON) 0000000 MG chewable tablet, Chew 125 mg by mouth every 6 (six) hours as needed for flatulence., Disp: , Rfl:  .  vitamin E 400 UNIT capsule, Take 800 Units by mouth 2 (two) times daily. For tartive dyskinesia, Disp: , Rfl:  .  guaiFENesin 200 MG tablet, Take 1 tablet (200 mg total) by mouth every 6 (six) hours as needed for cough or to loosen phlegm., Disp: 30 tablet, Rfl: 0  EXAM:  Vitals:   04/26/16 1451  BP: (!) 144/82  Pulse: 86  Temp: 98.1 F (36.7 C)    Body mass index is 28.3 kg/m.  GENERAL: vitals reviewed and listed above, alert, oriented, appears well hydrated and in no acute distress  HEENT: atraumatic, conjunttiva clear, no obvious abnormalities on inspection of external nose and ears, normal appearance of ear canals and TMs, clear nasal congestion, mild post  oropharyngeal erythema with PND, no tonsillar edema or exudate, no sinus TTP  NECK: no obvious masses on inspection  LUNGS: clear to auscultation bilaterally, no wheezes, rales or rhonchi, good air movement  CV: HRRR, no peripheral edema  MS: moves all extremities without noticeable abnormality  PSYCH: pleasant and cooperative, no obvious depression or anxiety  ASSESSMENT AND PLAN:  Discussed the following assessment and plan:  Acute upper respiratory infection  -given HPI and exam findings today, a serious infection or illness is unlikely. We discussed potential etiologies, with VURI being most likely, and advised supportive care and monitoring. We discussed treatment side effects, likely course, antibiotic misuse, transmission, and signs of developing a serious illness. -of course, we advised to return or notify a doctor immediately if symptoms worsen or persist or new concerns arise.    Patient Instructions  INSTRUCTIONS FOR UPPER RESPIRATORY INFECTION:  -plenty of rest and fluids  -nasal saline wash 2-3 times daily (use prepackaged nasal saline or bottled/distilled water if making your own)   -in the winter time, using a humidifier at night is helpful (please follow cleaning instructions)  -if you are taking a cough medication - use only as directed, may also try a teaspoon of honey to coat the throat and throat lozenges. -for sore throat, salt water gargles can help  -follow up if you have fevers, facial pain, tooth pain, difficulty breathing or are worsening or symptoms persist longer then expected  Upper Respiratory Infection, Adult An upper respiratory infection (URI) is also known as the common cold. It is often caused by a type of germ (virus). Colds are easily spread (contagious). You can pass it to others by kissing, coughing, sneezing, or drinking out of the same glass. Usually, you get better  in 1 to 3  weeks.  However, the cough can last for even longer. HOME  CARE   Only take medicine as told by your doctor. Follow instructions provided above.  Drink enough water and fluids to keep your pee (urine) clear or pale yellow.  Get plenty of rest.  Return to work when your temperature is < 100 for 24 hours or as told by your doctor. You may use a face mask and wash your hands to stop your cold from spreading. GET HELP RIGHT AWAY IF:   After the first few days, you feel you are getting worse.  You have questions about your medicine.  You have chills, shortness of breath, or red spit (mucus).  You have pain in the face for more then 1-2 days, especially when you bend forward.  You have a fever, puffy (swollen) neck, pain when you swallow, or white spots in the back of your throat.  You have a bad headache, ear pain, sinus pain, or chest pain.  You have a high-pitched whistling sound when you breathe in and out (wheezing).  You cough up blood.  You have sore muscles or a stiff neck. MAKE SURE YOU:   Understand these instructions.  Will watch your condition.  Will get help right away if you are not doing well or get worse. Document Released: 11/02/2007 Document Revised: 08/08/2011 Document Reviewed: 08/21/2013 [facility] Patient Information 2015 [place], [place]. This information is not intended to replace advice given to you by your health care provider. Make sure you discuss any questions you have with your health care provider.    Colin Benton R., DO

## 2016-04-26 NOTE — Patient Instructions (Addendum)
INSTRUCTIONS FOR UPPER RESPIRATORY INFECTION:  -plenty of rest and fluids  -nasal saline wash 2-3 times daily (use prepackaged nasal saline or bottled/distilled water if making your own)   -in the winter time, using a humidifier at night is helpful (please follow cleaning instructions)  -if you are taking a cough medication - use only as directed, may also try a teaspoon of honey to coat the throat and throat lozenges.  -for sore throat, salt water gargles can help  -follow up if you have fevers, facial pain, tooth pain, difficulty breathing or are worsening or symptoms persist longer then expected  Upper Respiratory Infection, Adult An upper respiratory infection (URI) is also known as the common cold. It is often caused by a type of germ (virus). Colds are easily spread (contagious). You can pass it to others by kissing, coughing, sneezing, or drinking out of the same glass. Usually, you get better in 1 to 3  weeks.  However, the cough can last for even longer. HOME CARE   Only take medicine as told by your doctor. Follow instructions provided above.  Drink enough water and fluids to keep your pee (urine) clear or pale yellow.  Get plenty of rest.  Return to work when your temperature is < 100 for 24 hours or as told by your doctor. You may use a face mask and wash your hands to stop your cold from spreading. GET HELP RIGHT AWAY IF:   After the first few days, you feel you are getting worse.  You have questions about your medicine.  You have chills, shortness of breath, or red spit (mucus).  You have pain in the face for more then 1-2 days, especially when you bend forward.  You have a fever, puffy (swollen) neck, pain when you swallow, or white spots in the back of your throat.  You have a bad headache, ear pain, sinus pain, or chest pain.  You have a high-pitched whistling sound when you breathe in and out (wheezing).  You cough up blood.  You have sore muscles or a  stiff neck. MAKE SURE YOU:   Understand these instructions.  Will watch your condition.  Will get help right away if you are not doing well or get worse. Document Released: 11/02/2007 Document Revised: 08/08/2011 Document Reviewed: 08/21/2013 [employer] Patient Information 2015 [employer]. This information is not intended to replace advice given to you by your health care provider. Make sure you discuss any questions you have with your health care provider.  

## 2016-04-26 NOTE — Progress Notes (Signed)
Pre visit review using our clinic review tool, if applicable. No additional management support is needed unless otherwise documented below in the visit note. 

## 2016-05-02 DIAGNOSIS — C250 Malignant neoplasm of head of pancreas: Secondary | ICD-10-CM | POA: Diagnosis not present

## 2016-05-02 DIAGNOSIS — K432 Incisional hernia without obstruction or gangrene: Secondary | ICD-10-CM | POA: Diagnosis not present

## 2016-06-06 ENCOUNTER — Other Ambulatory Visit: Payer: Self-pay | Admitting: Family Medicine

## 2016-06-14 ENCOUNTER — Ambulatory Visit (INDEPENDENT_AMBULATORY_CARE_PROVIDER_SITE_OTHER): Payer: Medicare Other | Admitting: Orthopaedic Surgery

## 2016-06-14 ENCOUNTER — Encounter (INDEPENDENT_AMBULATORY_CARE_PROVIDER_SITE_OTHER): Payer: Self-pay | Admitting: Orthopaedic Surgery

## 2016-06-14 VITALS — BP 141/72 | HR 70 | Ht 61.0 in | Wt 141.0 lb

## 2016-06-14 DIAGNOSIS — M12262 Villonodular synovitis (pigmented), left knee: Secondary | ICD-10-CM | POA: Diagnosis not present

## 2016-06-14 NOTE — Progress Notes (Signed)
Office Visit Note   Patient: [name]           Date of Birth: [birth year]           MRN: [id number] Visit Date: 06/14/2016              Requested by: Marin Olp, MD [address], [place] [address] PCP: [name], MD   Assessment & Plan: Visit Diagnoses:  1. Pigmented villonodular synovitis of knee, left     Plan: Patient's done well with arthroscopic debridement of PVNS. She will like to resume dancing. We discussed fall prevention staying active etc. Office follow-up when necessary  Follow-Up Instructions: Return if symptoms worsen or fail to improve.   Orders:  No orders of the defined types were placed in this encounter.  No orders of the defined types were placed in this encounter.     Procedures: No procedures performed   Clinical Data: No additional findings.   Subjective: Chief Complaint  Patient presents with  . Left Knee - Follow-up    Patient states she is doing well post left knee arthroscopy: synovectomy 02/15/16.  Patient states has occasional pain in left knee when up for long periods of time.  Patient is no longer using cane, states is doing very well.  Patient states she is doing well with home exercises.  She takes ibuprofren or tylenol for pain with left knee.     Review of Systems  Constitutional: Negative for chills and diaphoresis.  HENT: Negative for ear discharge, ear pain and nosebleeds.   Eyes: Negative for discharge and visual disturbance.  Respiratory: Negative for cough, choking and shortness of breath.   Cardiovascular: Negative for chest pain and palpitations.  Gastrointestinal: Negative for abdominal distention and abdominal pain.  Endocrine: Negative for cold intolerance and heat intolerance.  Genitourinary: Negative for flank pain and hematuria.  Musculoskeletal:       Positive for PVNS of the knee good relief with arthroscopic debridement. Previous lumbar instrument fusion for instability and  spondylolisthesis with spinal stenosis. L4-S1.  Skin: Negative for rash and wound.  Neurological: Negative for seizures and speech difficulty.  Hematological: Negative for adenopathy. Does not bruise/bleed easily.  Psychiatric/Behavioral: Negative for agitation and suicidal ideas.     Objective: Vital Signs: BP (!) 141/72   Pulse 70   Ht _0  (1.549 m)   Wt 141 lb (64 kg)   BMI 26.64 kg/m   Physical Exam  Constitutional: She is oriented to person, place, and time. She appears well-developed.  HENT:  Head: Normocephalic.  Right Ear: External ear normal.  Left Ear: External ear normal.  Eyes: Pupils are equal, round, and reactive to light.  Neck: No tracheal deviation present. No thyromegaly present.  Cardiovascular: Normal rate.   Pulmonary/Chest: Effort normal.  Abdominal: Soft.  Musculoskeletal:  Patient has no knee swelling well-healed anterior incision. Total knee arthroplasties working well she's not had problems with recurrent hemarthrosis. She's no longer using a cane or a walker with ambulation. Lumbar incisions well-healed.  Neurological: She is alert and oriented to person, place, and time.  Skin: Skin is warm and dry.  Psychiatric: She has a normal mood and affect. Her behavior is normal.    Ortho Exam  Specialty Comments:  No specialty comments available.  Imaging: No results found.   PMFS History: Patient Active Problem List   Diagnosis Date Noted  . Pigmented villonodular synovitis of knee, left 03/22/2016  . Abnormal radionuclide bone  scan 08/16/2015  . Ankle fracture, bimalleolar, closed 08/16/2015  . Central chest pain 07/28/2015  . HLD (hyperlipidemia) 07/28/2015  . Fatty liver 09/09/2014  . Lumbago 07/07/2014  . Abnormality of gait 03/27/2014  . Memory loss 01/08/2014  . 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 Other abnormal glucose 11/29/2013  . Carcinoma of head of pancreas ([place]) 07/01/2013  . Hereditary and idiopathic peripheral  neuropathy 08/30/2012  . Paresthesia of foot 06/06/2012  . Syncope 09/26/2011  . RHINOSINUSITIS, CHRONIC 02/03/2009  . COLONIC POLYPS 06/05/2008  . OSTEOARTHRITIS 06/04/2008  . DIARRHEA, CHRONIC 04/22/2008  . Low back pain 08/03/2007  . Chronic maxillary sinusitis 04/20/2007  . Hypothyroidism 02/26/2007  . HYPERLIPIDEMIA 02/26/2007  . ANXIETY 02/26/2007  . Adjustment disorder with mixed anxiety and depressed mood 02/26/2007  . TARDIVE DYSKINESIA 02/26/2007  . GLAUCOMA 02/26/2007  . EUSTACHIAN TUBE DYSFUNCTION 02/26/2007  . Essential hypertension 02/26/2007  . MITRAL VALVE PROLAPSE 02/26/2007  . ALLERGIC RHINITIS 02/26/2007  . GERD 02/26/2007  . HIATAL HERNIA 02/26/2007  . OSTEOPOROSIS 02/26/2007   Past Medical History:  Diagnosis Date  . Allergic rhinitis   . Cancer ([place]) 07/10/13   Pancreatic cancer with MRI scan 06-19-13  . Chronic maxillary sinusitis    neti pot  . Depression    alone a lot  . Eustachian tube dysfunction   . GERD (gastroesophageal reflux disease)   . Glaucoma   . Heart murmur    hx. "MVP" -predental antibiotics  . Hiatal hernia   . Hypertension   . Hypothyroid   . Memory loss    short term memory loss  . Mitral valve prolapse    antibiotics before dental procedures  . Osteoarthritis   . Stroke [facility])    mini storkes left leg paraylsis. patient denies weakness 01/08/14.   . Tardive dyskinesia    possibly reglan, vitamin E helps  . Urgency of urination    some UTI in past    Family History  Problem Relation Age of Onset  . Cancer Mother     Breast Cancer with Metastatic disease  . Alzheimer's disease Father   . Other Brother 68    GSW    Past Surgical History:  Procedure Laterality Date  . 1 baker cyst removed    . ABDOMINAL HYSTERECTOMY     including ovaries  . BACK SURGERY     fusion  . BLEPHAROPLASTY Bilateral    with cataract surgery  . BREAST SURGERY     Biopsy left 2 times  . COLONOSCOPY W/ POLYPECTOMY     2004 last  colonoscopy, no polyps  . DILATION AND CURETTAGE OF UTERUS     x3  . ESOPHAGOGASTRODUODENOSCOPY N/A 09/11/2013   Procedure: ESOPHAGOGASTRODUODENOSCOPY (EGD);  Surgeon: Cleotis Nipper, MD;  Location[facility] ENDOSCOPY;  Service: Endoscopy;  Laterality: N/A;  Moderate sedation okay if MAC not available  . EUS N/A 07/10/2013   Procedure: ESOPHAGEAL ENDOSCOPIC ULTRASOUND (EUS) RADIAL;  Surgeon: Arta Silence, MD;  Location: [facility] ENDOSCOPY;  Service: Endoscopy;  Laterality: N/A;  . EYE SURGERY Right    cataract  . FINE NEEDLE ASPIRATION N/A 07/10/2013   Procedure: FINE NEEDLE ASPIRATION (FNA) LINEAR;  Surgeon: Arta Silence, MD;  Location: [facility] ENDOSCOPY;  Service: Endoscopy;  Laterality: N/A;  possible fna  . JOINT REPLACEMENT     LTKA  . KNEE SURGERY Left    x 5, total knee Left knee  . LAPAROSCOPY N/A 08/07/2013   Procedure: LAPAROSCOPY DIAGNOSTIC;  Surgeon: [name], MD;  Location: [facility] OR;  Service: General;  Laterality: N/A;  . LUMBAR SPINE SURGERY     x2  . LUMBAR SPINE SURGERY     cyst  . ORIF ANKLE FRACTURE Right 08/16/2015   Procedure: OPEN REDUCTION INTERNAL FIXATION (ORIF) ANKLE FRACTURE;  Surgeon: Meredith Pel, MD;  Location: [place];  Service: Orthopedics;  Laterality: Right;  . RADIOACTIVE SEED GUIDED EXCISIONAL BREAST BIOPSY Left 12/15/2015   Procedure: LEFT RADIOACTIVE SEED GUIDED EXCISIONAL BREAST BIOPSY;  Surgeon: [name], MD;  Location: [place];  Service: General;  Laterality: Left;  . WHIPPLE PROCEDURE N/A 08/07/2013   Procedure: WHIPPLE PROCEDURE;  Surgeon: [name], MD;  Location: [place];  Service: General;  Laterality: N/A;   Social History   Occupational History  .  Retired  . Retired    Social History Main Topics  . Smoking status: Never Smoker  . Smokeless tobacco: Never Used  . Alcohol use No  . Drug use: No  . Sexual activity: Not Currently

## 2016-07-04 ENCOUNTER — Ambulatory Visit (INDEPENDENT_AMBULATORY_CARE_PROVIDER_SITE_OTHER): Payer: Medicare Other | Admitting: Family Medicine

## 2016-07-04 ENCOUNTER — Encounter: Payer: Self-pay | Admitting: Family Medicine

## 2016-07-04 VITALS — BP 152/82 | HR 72 | Temp 98.8°F | Wt 151.2 lb

## 2016-07-04 DIAGNOSIS — J069 Acute upper respiratory infection, unspecified: Secondary | ICD-10-CM | POA: Diagnosis not present

## 2016-07-04 NOTE — Progress Notes (Addendum)
PCP: [name], MD  Subjective:  [name] is a 76 y.o. year old very pleasant female patient who presents with Upper Respiratory infection symptoms including nasal congestion, sore throat, cough, nasal discharge yellow or green but no sinus pressure. Also has continued headache but this has been noted back to 2016 and has had MR brain. No blurry vision. She felt like her throat "broke out" yesterday but looks normal again today -started: about a week ago, symptoms are not improving yet. Gas pain in lower abdomen- beano helps and gas-x. BM every day. Passing gas helps -previous treatments:  Cough drops -sick contacts/travel/risks: denies flu exposure.   ROS-denies fever, SOB, NVD, tooth pain  Pertinent Past Medical History-  Patient Active Problem List   Diagnosis Date Noted  . Exocrine pancreatic insufficiency 12/30/2013    Priority: High  . Carcinoma of head of pancreas ([place]) 07/01/2013    Priority: High  . Central chest pain 07/28/2015    Priority: Medium  . HLD (hyperlipidemia) 07/28/2015    Priority: Medium  . Fatty liver 09/09/2014    Priority: Medium  . Memory loss 01/08/2014    Priority: Medium  . Other abnormal glucose 11/29/2013    Priority: Medium  . Hereditary and idiopathic peripheral neuropathy 08/30/2012    Priority: Medium  . Paresthesia of foot 06/06/2012    Priority: Medium  . Syncope 09/26/2011    Priority: Medium  . Hypothyroidism 02/26/2007    Priority: Medium  . HYPERLIPIDEMIA 02/26/2007    Priority: Medium  . ANXIETY 02/26/2007    Priority: Medium  . Adjustment disorder with mixed anxiety and depressed mood 02/26/2007    Priority: Medium  . Essential hypertension 02/26/2007    Priority: Medium  . MITRAL VALVE PROLAPSE 02/26/2007    Priority: Medium  . OSTEOPOROSIS 02/26/2007    Priority: Medium  . Lumbago 07/07/2014    Priority: Low  . Abnormality of gait 03/27/2014    Priority: Low  . RHINOSINUSITIS, CHRONIC 02/03/2009    Priority:  Low  . COLONIC POLYPS 06/05/2008    Priority: Low  . OSTEOARTHRITIS 06/04/2008    Priority: Low  . DIARRHEA, CHRONIC 04/22/2008    Priority: Low  . Low back pain 08/03/2007    Priority: Low  . Chronic maxillary sinusitis 04/20/2007    Priority: Low  . TARDIVE DYSKINESIA 02/26/2007    Priority: Low  . GLAUCOMA 02/26/2007    Priority: Low  . EUSTACHIAN TUBE DYSFUNCTION 02/26/2007    Priority: Low  . ALLERGIC RHINITIS 02/26/2007    Priority: Low  . GERD 02/26/2007    Priority: Low  . HIATAL HERNIA 02/26/2007    Priority: Low  . Pigmented villonodular synovitis of knee, left 03/22/2016  . Abnormal radionuclide bone scan 08/16/2015  . Ankle fracture, bimalleolar, closed 08/16/2015   Medications- reviewed  Current Outpatient Prescriptions  Medication Sig Dispense Refill  . aspirin 81 MG tablet Take 81 mg by mouth daily.    Marland Kitchen atenolol (TENORMIN) 25 MG tablet TAKE 1 TABLET BY MOUTH  EVERY MORNING 90 tablet 1  . busPIRone (BUSPAR) 15 MG tablet Take 15 mg by mouth at bedtime.    . Calcium Carbonate (CALCIUM 600 PO) Take 1 tablet by mouth daily.    . carboxymethylcellulose (REFRESH TEARS) 0.5 % SOLN Place 1 drop into both eyes 2 (two) times daily.     . cholecalciferol (VITAMIN D) 1000 UNITS tablet Take 1,000 Units by mouth 2 (two) times daily.     . cyanocobalamin  2000 MCG tablet Take 2,000 mcg by mouth daily at 12 noon.     . docusate sodium (COLACE) 100 MG capsule Take 200 mg by mouth at bedtime.    . DULoxetine (CYMBALTA) 30 MG capsule Take 1 capsule (30 mg total) by mouth 2 (two) times daily. 180 capsule 3  . feeding supplement (BOOST HIGH PROTEIN) LIQD Take 1 Container by mouth daily.    Marland Kitchen FLUoxetine (PROZAC) 20 MG capsule Take 1 capsule (20 mg total) by mouth daily. 90 capsule 2  . furosemide (LASIX) 20 MG tablet Take 1 tablet (20 mg total) by mouth daily. 90 tablet 3  . guaiFENesin 200 MG tablet Take 1 tablet (200 mg total) by mouth every 6 (six) hours as needed for cough or to  loosen phlegm. 30 tablet 0  . HYDROcodone-acetaminophen (NORCO/VICODIN) 5-325 MG tablet Take 1-2 tablets by mouth every 6 (six) hours as needed for moderate pain. 30 tablet 0  . ibuprofen (ADVIL,MOTRIN) 200 MG tablet Take 200 mg by mouth every 6 (six) hours as needed for moderate pain.    Marland Kitchen levothyroxine (SYNTHROID, LEVOTHROID) 175 MCG tablet TAKE 1 TABLET BY MOUTH  DAILY BEFORE BREAKFAST 90 tablet 1  . lipase/protease/amylase (CREON) 12000 UNITS CPEP capsule Take 2 capsules (24,000 Units total) by mouth 3 (three) times daily before meals. (Patient taking differently: Take 12,000 Units by mouth 3 (three) times daily before meals. ) 270 capsule 5  . lovastatin (MEVACOR) 40 MG tablet TAKE 1 TABLET BY MOUTH AT  BEDTIME 90 tablet 1  . Multiple Vitamin (MULTIVITAMIN WITH MINERALS) TABS tablet Take 1 tablet by mouth daily.    Marland Kitchen omeprazole (PRILOSEC OTC) 20 MG tablet Take 20 mg by mouth 2 (two) times daily.     . polycarbophil (FIBERCON) 625 MG tablet Take 625 mg by mouth daily.     . potassium chloride (K-DUR) 10 MEQ tablet Take 1 tablet (10 mEq total) by mouth daily. 90 tablet 3  . pregabalin (LYRICA) 300 MG capsule Take 1 capsule (300 mg total) by mouth 2 (two) times daily. 180 capsule 1  . promethazine (PHENERGAN) 25 MG tablet Take 25 mg by mouth every 6 (six) hours as needed for nausea or vomiting.    . psyllium (REGULOID) 0.52 g capsule Take 0.52 g by mouth daily.    Marland Kitchen rOPINIRole (REQUIP) 0.5 MG tablet Take 1 tablet (0.5 mg total) by mouth at bedtime. 30 tablet 6  . simethicone (MYLICON) 0000000 MG chewable tablet Chew 125 mg by mouth every 6 (six) hours as needed for flatulence.    . vitamin E 400 UNIT capsule Take 800 Units by mouth 2 (two) times daily. For tartive dyskinesia     No current facility-administered medications for this visit.     Objective: BP (!) 152/82   Pulse 72   Temp 98.8 F (37.1 C)   Wt 151 lb 3.2 oz (68.6 kg)   SpO2 96%   BMI 28.57 kg/m  Gen: NAD, resting  comfortably HEENT: Turbinates erythematou with clear drainage, TM normal, pharynx mildly erythematous with no tonsilar exudate or edema, no sinus tenderness CV: RRR no murmurs rubs or gallops Lungs: CTAB no crackles, wheeze, rhonchi Abdomen: soft/nontender/nondistended/normal bowel sounds. No rebound or guarding.  Ext: no edema Skin: warm, dry, no rash Neuro: grossly normal, moves all extremities  Assessment/Plan:  Upper Respiratory infection History and exam today are suggestive of viral infection most likely due to upper respiratory infection. Symptomatic treatment with: mucinex to loosen up discharge- had  been prescribed back in November- can take this for current illness  We discussed that we did not find any infection that had higher probability of being bacterial such as pneumonia or strep throat. We discussed signs that bacterial infection may have developed particularly fever or shortness of breath.   Likely course of 2 weeks. Patient is contagious and advised good handwashing and consideration of mask If going to be in public places.   Patient also mentions some gas pain that beano and gas-x help. She may continue this. If continued issues may we discussed follow up 2-3 weeks. She also mentioned when she was feeling ill last week at start of this felt some brief palpitations and shortness of breath but resolved- did not seek care. She is instructed to follow up immediately if recurs  Finally, we reviewed reasons to return to care including if symptoms worsen or persist or new concerns arise- once again particularly shortness of breath or fever.  [name], MD

## 2016-07-04 NOTE — Progress Notes (Signed)
Pre visit review using our clinic review tool, if applicable. No additional management support is needed unless otherwise documented below in the visit note. 

## 2016-07-04 NOTE — Patient Instructions (Signed)
Upper Respiratory infection History and exam today are suggestive of viral infection most likely due to upper respiratory infection. Symptomatic treatment with: mucinex to loosen up discharge- had been prescribed back in November- can take this for current illness  We discussed that we did not find any infection that had higher probability of being bacterial such as pneumonia or strep throat. We discussed signs that bacterial infection may have developed particularly fever or shortness of breath.   Likely course of 2 weeks. Patient is contagious and advised good handwashing and consideration of mask If going to be in public places.   Patient also mentions some gas pain that beano and gas-x help. She may continue this. If continued issues may w  Finally, we reviewed reasons to return to care including if symptoms worsen or persist or new concerns arise- once again particularly shortness of breath or fever.

## 2016-07-18 ENCOUNTER — Ambulatory Visit (INDEPENDENT_AMBULATORY_CARE_PROVIDER_SITE_OTHER): Payer: Medicare Other | Admitting: Neurology

## 2016-07-18 ENCOUNTER — Encounter: Payer: Self-pay | Admitting: Neurology

## 2016-07-18 VITALS — BP 164/91 | HR 68 | Ht 61.0 in | Wt 153.5 lb

## 2016-07-18 DIAGNOSIS — G609 Hereditary and idiopathic neuropathy, unspecified: Secondary | ICD-10-CM | POA: Diagnosis not present

## 2016-07-18 MED ORDER — PREGABALIN 100 MG PO CAPS: 100.0000 mg | ORAL_CAPSULE | Freq: Two times a day (BID) | ORAL | 3 refills | Status: DC

## 2016-07-18 NOTE — Progress Notes (Signed)
Reason for visit: Peripheral neuropathy  [name] is an 76 y.o. female  History of present illness:  [name] is a 76 year old right-handed white female with a history of a small fiber neuropathy. The patient has had a lot of discomfort with the neuropathy. She has been on Lyrica which has offered some benefit at a 300 mg twice daily dosing. Cymbalta was added to her regimen when last seen, but the patient is not sure that this helped her at all. The patient did not get much benefit from Trileptal. The patient is on 30 mg twice daily of Cymbalta. She has not had any falls, and she does have some left knee discomfort which has improved with treatment and she is walking better. The patient may cycle with the pain, she may have 3 or 4 days of significant discomfort in the evening hours, then go several days without any pain.  Past Medical History:  Diagnosis Date  . Allergic rhinitis   . Cancer ([place]) 07/10/13   Pancreatic cancer with MRI scan 06-19-13  . Chronic maxillary sinusitis    neti pot  . Depression    alone a lot  . Eustachian tube dysfunction   . GERD (gastroesophageal reflux disease)   . Glaucoma   . Heart murmur    hx. "MVP" -predental antibiotics  . Hiatal hernia   . Hypertension   . Hypothyroid   . Memory loss    short term memory loss  . Mitral valve prolapse    antibiotics before dental procedures  . Osteoarthritis   . Stroke [facility])    mini storkes left leg paraylsis. patient denies weakness 01/08/14.   . Tardive dyskinesia    possibly reglan, vitamin E helps  . Urgency of urination    some UTI in past    Past Surgical History:  Procedure Laterality Date  . 1 baker cyst removed    . ABDOMINAL HYSTERECTOMY     including ovaries  . BACK SURGERY     fusion  . BLEPHAROPLASTY Bilateral    with cataract surgery  . BREAST SURGERY     Biopsy left 2 times  . COLONOSCOPY W/ POLYPECTOMY     2004 last colonoscopy, no polyps  . DILATION AND CURETTAGE OF  UTERUS     x3  . ESOPHAGOGASTRODUODENOSCOPY N/A 09/11/2013   Procedure: ESOPHAGOGASTRODUODENOSCOPY (EGD);  Surgeon: Cleotis Nipper, MD;  Location: [facility] ENDOSCOPY;  Service: Endoscopy;  Laterality: N/A;  Moderate sedation okay if MAC not available  . EUS N/A 07/10/2013   Procedure: ESOPHAGEAL ENDOSCOPIC ULTRASOUND (EUS) RADIAL;  Surgeon: Arta Silence, MD;  Location: [facility] ENDOSCOPY;  Service: Endoscopy;  Laterality: N/A;  . EYE SURGERY Right    cataract  . FINE NEEDLE ASPIRATION N/A 07/10/2013   Procedure: FINE NEEDLE ASPIRATION (FNA) LINEAR;  Surgeon: Arta Silence, MD;  Location: [facility] ENDOSCOPY;  Service: Endoscopy;  Laterality: N/A;  possible fna  . JOINT REPLACEMENT     LTKA  . KNEE SURGERY Left    x 5, total knee Left knee  . LAPAROSCOPY N/A 08/07/2013   Procedure: LAPAROSCOPY DIAGNOSTIC;  Surgeon: [name], MD;  Location: [place];  Service: General;  Laterality: N/A;  . LUMBAR SPINE SURGERY     x2  . LUMBAR SPINE SURGERY     cyst  . ORIF ANKLE FRACTURE Right 08/16/2015   Procedure: OPEN REDUCTION INTERNAL FIXATION (ORIF) ANKLE FRACTURE;  Surgeon: Meredith Pel, MD;  Location: [place];  Service: Orthopedics;  Laterality: Right;  . RADIOACTIVE SEED GUIDED EXCISIONAL BREAST BIOPSY Left 12/15/2015   Procedure: LEFT RADIOACTIVE SEED GUIDED EXCISIONAL BREAST BIOPSY;  Surgeon: [name], MD;  Location: [place];  Service: General;  Laterality: Left;  . WHIPPLE PROCEDURE N/A 08/07/2013   Procedure: WHIPPLE PROCEDURE;  Surgeon: [name], MD;  Location: [facility] OR;  Service: General;  Laterality: N/A;    Family History  Problem Relation Age of Onset  . Cancer Mother     Breast Cancer with Metastatic disease  . Alzheimer's disease Father   . Other Brother 43    GSW    Social history:  reports that she has never smoked. She has never used smokeless tobacco. She reports that she does not drink alcohol or use drugs.    Allergies  Allergen Reactions  . Other Other (See Comments)     According to patient Trilafor and Reglan cause Tardive Dyskinesia  . Metoclopramide Hcl Other (See Comments)    Shaking; caused tardive dyskinesia  . Niacin Other (See Comments)    shaking  . Trovan [Alatrofloxacin Mesylate] Other (See Comments)    Caused shaking and nervousness  . Benzocaine-Resorcinol Rash  . Celecoxib Other (See Comments)    unknown  . Erythromycin Base Rash  . Glucosamine Other (See Comments)    unknown  . Nortriptyline Other (See Comments)    Dizziness   . Phenazopyridine Hcl Other (See Comments)    unknown  . Sulfa Antibiotics Nausea And Vomiting and Rash  . Sulfonamide Derivatives Nausea And Vomiting and Rash    Medications:  Prior to Admission medications   Medication Sig Start Date End Date Taking? Authorizing Provider  aspirin 81 MG tablet Take 81 mg by mouth daily.   Yes Historical Provider, MD  atenolol (TENORMIN) 25 MG tablet TAKE 1 TABLET BY MOUTH  EVERY MORNING 06/07/16  Yes Marin Olp, MD  busPIRone (BUSPAR) 15 MG tablet Take 15 mg by mouth at bedtime.   Yes Historical Provider, MD  Calcium Carbonate (CALCIUM 600 PO) Take 1 tablet by mouth daily.   Yes Historical Provider, MD  carboxymethylcellulose (REFRESH TEARS) 0.5 % SOLN Place 1 drop into both eyes 2 (two) times daily.    Yes Historical Provider, MD  cholecalciferol (VITAMIN D) 1000 UNITS tablet Take 1,000 Units by mouth 2 (two) times daily.    Yes Historical Provider, MD  cyanocobalamin 2000 MCG tablet Take 2,000 mcg by mouth daily at 12 noon.    Yes Historical Provider, MD  docusate sodium (COLACE) 100 MG capsule Take 200 mg by mouth at bedtime.   Yes Historical Provider, MD  DULoxetine (CYMBALTA) 30 MG capsule Take 1 capsule (30 mg total) by mouth 2 (two) times daily. 04/04/16  Yes Kathrynn Ducking, MD  feeding supplement (BOOST HIGH PROTEIN) LIQD Take 1 Container by mouth daily.   Yes Historical Provider, MD  FLUoxetine (PROZAC) 20 MG capsule Take 1 capsule (20 mg total) by mouth daily.  11/17/14  Yes Marin Olp, MD  furosemide (LASIX) 20 MG tablet Take 1 tablet (20 mg total) by mouth daily. 08/06/15  Yes Isaiah Serge, NP  guaiFENesin 200 MG tablet Take 1 tablet (200 mg total) by mouth every 6 (six) hours as needed for cough or to loosen phlegm. 04/26/16  Yes Lucretia Kern, DO  HYDROcodone-acetaminophen (NORCO/VICODIN) 5-325 MG tablet Take 1-2 tablets by mouth every 6 (six) hours as needed for moderate pain. 03/22/16  Yes [name], MD  ibuprofen (ADVIL,MOTRIN) 200  MG tablet Take 200 mg by mouth every 6 (six) hours as needed for moderate pain.   Yes Historical Provider, MD  levothyroxine (SYNTHROID, LEVOTHROID) 175 MCG tablet TAKE 1 TABLET BY MOUTH  DAILY BEFORE BREAKFAST 06/07/16  Yes Marin Olp, MD  lipase/protease/amylase (CREON) 12000 UNITS CPEP capsule Take 2 capsules (24,000 Units total) by mouth 3 (three) times daily before meals. Patient taking differently: Take 12,000 Units by mouth 3 (three) times daily before meals.  05/25/14  Yes Michael Boston, MD  lovastatin (MEVACOR) 40 MG tablet TAKE 1 TABLET BY MOUTH AT  BEDTIME 06/07/16  Yes Marin Olp, MD  Multiple Vitamin (MULTIVITAMIN WITH MINERALS) TABS tablet Take 1 tablet by mouth daily.   Yes Historical Provider, MD  omeprazole (PRILOSEC OTC) 20 MG tablet Take 20 mg by mouth 2 (two) times daily.    Yes Historical Provider, MD  polycarbophil (FIBERCON) 625 MG tablet Take 625 mg by mouth daily.    Yes Historical Provider, MD  potassium chloride (K-DUR) 10 MEQ tablet Take 1 tablet (10 mEq total) by mouth daily. 08/06/15  Yes Isaiah Serge, NP  pregabalin (LYRICA) 300 MG capsule Take 1 capsule (300 mg total) by mouth 2 (two) times daily. 04/05/16  Yes Kathrynn Ducking, MD  promethazine (PHENERGAN) 25 MG tablet Take 25 mg by mouth every 6 (six) hours as needed for nausea or vomiting.   Yes Historical Provider, MD  psyllium (REGULOID) 0.52 g capsule Take 0.52 g by mouth daily.   Yes Historical Provider, MD  rOPINIRole  (REQUIP) 0.5 MG tablet Take 1 tablet (0.5 mg total) by mouth at bedtime. 05/11/15  Yes Dennie Bible, NP  simethicone (MYLICON) 937 MG chewable tablet Chew 125 mg by mouth every 6 (six) hours as needed for flatulence.   Yes Historical Provider, MD  vitamin E 400 UNIT capsule Take 800 Units by mouth 2 (two) times daily. For tartive dyskinesia   Yes Historical Provider, MD  pregabalin (LYRICA) 100 MG capsule Take 1 capsule (100 mg total) by mouth 2 (two) times daily. 07/18/16   Kathrynn Ducking, MD    ROS:  Out of a complete 14 system review of symptoms, the patient complains only of the following symptoms, and all other reviewed systems are negative.  Excessive sweating Hearing loss, ear pain, ringing in the ears, runny nose, difficulty swallowing Eye itching, eye redness, blurred vision Shortness of breath, chest tightness Chest pain, palpitations of the heart, heart murmur Heat intolerance, excessive thirst, excessive eating Swollen abdomen, abdominal pain, constipation, incontinence of the bowels Insomnia, frequent waking, snoring Environmental allergies Incontinence of the bladder, frequency of urination, urinary urgency Back pain, walking difficulty, neck pain, neck stiffness Moles, itching Headache, numbness Anxiety  Blood pressure (!) 164/91, pulse 68, height _0  (1.549 m), weight 153 lb 8 oz (69.6 kg).  Physical Exam  General: The patient is alert and cooperative at the time of the examination.  Skin: No significant peripheral edema is noted.   Neurologic Exam  Mental status: The patient is alert and oriented x 3 at the time of the examination. The patient has apparent normal recent and remote memory, with an apparently normal attention span and concentration ability.   Cranial nerves: Facial symmetry is present. Speech is normal, no aphasia or dysarthria is noted. Extraocular movements are full. Visual fields are full.  Motor: The patient has good strength in  all 4 extremities.  Sensory examination: Soft touch sensation is symmetric on the face, arms,  and legs.  Coordination: The patient has good finger-nose-finger and heel-to-shin bilaterally.  Gait and station: The patient has a normal gait. Tandem gait is normal. Romberg is negative. No drift is seen.  Reflexes: Deep tendon reflexes are symmetric.   Assessment/Plan:  1. Peripheral neuropathy, small fiber  The patient has ongoing discomfort, she is tolerating a relatively high-dose of Lyrica well, we will push the dose even higher to the maximum dose of 800 mg daily. She was given a prescription for the 100 mg capsules taking one at night for 3 weeks, then go to 1 twice daily. The patient will follow-up in about 4 or 5 months. We may go up on the Cymbalta in the future.  Jill Alexanders MD 07/18/2016 12:20 PM  [facility] [address] [place], [place] [address]  Phone [phone] Fax [phone]

## 2016-07-18 NOTE — Patient Instructions (Signed)
With the Lyrica continue the 300 mg twice a day. We will add 100 mg in the evening for 3 weeks, then go to 100 mg twice a day. You eventually will be taking a total of 400 mg twice a day.

## 2016-07-26 ENCOUNTER — Other Ambulatory Visit: Payer: Self-pay | Admitting: Neurology

## 2016-07-26 NOTE — Telephone Encounter (Signed)
Faxed printed/signed rx lyrica to pt pharmacy. Fax: [phone].

## 2016-07-29 ENCOUNTER — Ambulatory Visit (INDEPENDENT_AMBULATORY_CARE_PROVIDER_SITE_OTHER): Payer: Medicare Other | Admitting: Family Medicine

## 2016-07-29 ENCOUNTER — Encounter: Payer: Self-pay | Admitting: Family Medicine

## 2016-07-29 VITALS — BP 140/84 | HR 87 | Temp 98.2°F | Ht 61.0 in | Wt 154.6 lb

## 2016-07-29 DIAGNOSIS — J3089 Other allergic rhinitis: Secondary | ICD-10-CM

## 2016-07-29 DIAGNOSIS — R002 Palpitations: Secondary | ICD-10-CM

## 2016-07-29 MED ORDER — FLUTICASONE PROPIONATE 50 MCG/ACT NA SUSP: 2.0000 | Freq: Every day | NASAL | 6 refills | Status: DC

## 2016-07-29 NOTE — Patient Instructions (Addendum)
Trial flonase for the continued sneezing, runny nose- think this is running down throat and causing irritation. Follow up in 3-4 weeks if not improving but use consistently in this time frame.   For palpitations will get an overnight monitor done to make sure the rhythm looks ok. You will get a call about this and they will set it up at cardiology.  Would advise you to see your eye doctor.   Follow up sooner for new or worsening symptoms

## 2016-07-29 NOTE — Progress Notes (Signed)
Subjective:  [name] is a 76 y.o. year old very pleasant female patient who presents for/with See problem oriented charting ROS- no fever, chills. Stable headache pattern.    Past Medical History-  Patient Active Problem List   Diagnosis Date Noted  . Exocrine pancreatic insufficiency 12/30/2013    Priority: High  . Carcinoma of head of pancreas ([place]) 07/01/2013    Priority: High  . Central chest pain 07/28/2015    Priority: Medium  . HLD (hyperlipidemia) 07/28/2015    Priority: Medium  . Fatty liver 09/09/2014    Priority: Medium  . Memory loss 01/08/2014    Priority: Medium  . Other abnormal glucose 11/29/2013    Priority: Medium  . Hereditary and idiopathic peripheral neuropathy 08/30/2012    Priority: Medium  . Paresthesia of foot 06/06/2012    Priority: Medium  . Syncope 09/26/2011    Priority: Medium  . Hypothyroidism 02/26/2007    Priority: Medium  . HYPERLIPIDEMIA 02/26/2007    Priority: Medium  . ANXIETY 02/26/2007    Priority: Medium  . Adjustment disorder with mixed anxiety and depressed mood 02/26/2007    Priority: Medium  . Essential hypertension 02/26/2007    Priority: Medium  . MITRAL VALVE PROLAPSE 02/26/2007    Priority: Medium  . OSTEOPOROSIS 02/26/2007    Priority: Medium  . Lumbago 07/07/2014    Priority: Low  . Abnormality of gait 03/27/2014    Priority: Low  . RHINOSINUSITIS, CHRONIC 02/03/2009    Priority: Low  . COLONIC POLYPS 06/05/2008    Priority: Low  . OSTEOARTHRITIS 06/04/2008    Priority: Low  . DIARRHEA, CHRONIC 04/22/2008    Priority: Low  . Low back pain 08/03/2007    Priority: Low  . Chronic maxillary sinusitis 04/20/2007    Priority: Low  . TARDIVE DYSKINESIA 02/26/2007    Priority: Low  . GLAUCOMA 02/26/2007    Priority: Low  . EUSTACHIAN TUBE DYSFUNCTION 02/26/2007    Priority: Low  . ALLERGIC RHINITIS 02/26/2007    Priority: Low  . GERD 02/26/2007    Priority: Low  . HIATAL HERNIA 02/26/2007    Priority:  Low  . Pigmented villonodular synovitis of knee, left 03/22/2016  . Abnormal radionuclide bone scan 08/16/2015  . Ankle fracture, bimalleolar, closed 08/16/2015    Medications- reviewed and updated Current Outpatient Prescriptions  Medication Sig Dispense Refill  . aspirin 81 MG tablet Take 81 mg by mouth daily.    Marland Kitchen atenolol (TENORMIN) 25 MG tablet TAKE 1 TABLET BY MOUTH  EVERY MORNING 90 tablet 1  . busPIRone (BUSPAR) 15 MG tablet Take 15 mg by mouth at bedtime.    . Calcium Carbonate (CALCIUM 600 PO) Take 1 tablet by mouth daily.    . carboxymethylcellulose (REFRESH TEARS) 0.5 % SOLN Place 1 drop into both eyes 2 (two) times daily.     . cholecalciferol (VITAMIN D) 1000 UNITS tablet Take 1,000 Units by mouth 2 (two) times daily.     . cyanocobalamin 2000 MCG tablet Take 2,000 mcg by mouth daily at 12 noon.     . docusate sodium (COLACE) 100 MG capsule Take 200 mg by mouth at bedtime.    . DULoxetine (CYMBALTA) 30 MG capsule Take 1 capsule (30 mg total) by mouth 2 (two) times daily. 180 capsule 3  . feeding supplement (BOOST HIGH PROTEIN) LIQD Take 1 Container by mouth daily.    Marland Kitchen FLUoxetine (PROZAC) 20 MG capsule Take 1 capsule (20 mg total) by mouth daily.  90 capsule 2  . furosemide (LASIX) 20 MG tablet Take 1 tablet (20 mg total) by mouth daily. 90 tablet 3  . guaiFENesin 200 MG tablet Take 1 tablet (200 mg total) by mouth every 6 (six) hours as needed for cough or to loosen phlegm. 30 tablet 0  . HYDROcodone-acetaminophen (NORCO/VICODIN) 5-325 MG tablet Take 1-2 tablets by mouth every 6 (six) hours as needed for moderate pain. 30 tablet 0  . ibuprofen (ADVIL,MOTRIN) 200 MG tablet Take 200 mg by mouth every 6 (six) hours as needed for moderate pain.    Marland Kitchen levothyroxine (SYNTHROID, LEVOTHROID) 175 MCG tablet TAKE 1 TABLET BY MOUTH  DAILY BEFORE BREAKFAST 90 tablet 1  . lipase/protease/amylase (CREON) 12000 UNITS CPEP capsule Take 2 capsules (24,000 Units total) by mouth 3 (three) times  daily before meals. (Patient taking differently: Take 12,000 Units by mouth 3 (three) times daily before meals. ) 270 capsule 5  . lovastatin (MEVACOR) 40 MG tablet TAKE 1 TABLET BY MOUTH AT  BEDTIME 90 tablet 1  . LYRICA 300 MG capsule TAKE 1 CAPSULE BY MOUTH 2 TIMES DAILY 180 capsule 0  . Multiple Vitamin (MULTIVITAMIN WITH MINERALS) TABS tablet Take 1 tablet by mouth daily.    Marland Kitchen omeprazole (PRILOSEC OTC) 20 MG tablet Take 20 mg by mouth 2 (two) times daily.     . polycarbophil (FIBERCON) 625 MG tablet Take 625 mg by mouth daily.     . potassium chloride (K-DUR) 10 MEQ tablet Take 1 tablet (10 mEq total) by mouth daily. 90 tablet 3  . pregabalin (LYRICA) 100 MG capsule Take 1 capsule (100 mg total) by mouth 2 (two) times daily. 60 capsule 3  . promethazine (PHENERGAN) 25 MG tablet Take 25 mg by mouth every 6 (six) hours as needed for nausea or vomiting.    . psyllium (REGULOID) 0.52 g capsule Take 0.52 g by mouth daily.    Marland Kitchen rOPINIRole (REQUIP) 0.5 MG tablet Take 1 tablet (0.5 mg total) by mouth at bedtime. 30 tablet 6  . simethicone (MYLICON) 0000000 MG chewable tablet Chew 125 mg by mouth every 6 (six) hours as needed for flatulence.    . vitamin E 400 UNIT capsule Take 800 Units by mouth 2 (two) times daily. For tartive dyskinesia    . fluticasone (FLONASE) 50 MCG/ACT nasal spray Place 2 sprays into both nostrils daily. 16 g 6   No current facility-administered medications for this visit.     Objective: BP 140/84 (BP Location: Left Arm, Patient Position: Sitting, Cuff Size: Normal)   Pulse 87   Temp 98.2 F (36.8 C) (Oral)   Ht 5\' 1"  (1.549 m)   Wt 154 lb 9.6 oz (70.1 kg)   SpO2 94%   BMI 29.21 kg/m  Gen: NAD, resting comfortably TM normal. Pharynx with mild erythema and some cobblestoning. Nares with clear drainage CV: RRR no murmurs rubs or gallops Lungs: CTAB no crackles, wheeze, rhonchi Ext: no edema Skin: warm, dry  Assessment/Plan:  Sore throat/runny  nose/congestion Palpitations S: Sore throat for about a month. Runny nose. mucinex helps some. Sneezing fair amount. Seen a month ago and thought potentially URI though more congestion at that time.    Palpitations almost every night when she goes to bed (had only had one night of this at previous visit). Feels like a fluttering. No daytime symptoms. No symptoms with exertion. No chest pain or shortness of breath.   Also feels like some mild blurry vision at night at times  but completely normal today. Headaches are all day long though since at least 2016. Has had prior MRI which was reassuring. She is going to bring these up to Dr. Jannifer Franklin next visit for his opinion. I doubt temporal arteritis with 3 years of HA and diffuse nature of headache.   A/P: Suspect this may actually be allergic rhinitis- will trial flonase 3-4 weeks and follow up if not improved (sneezing, runny nose, mild sore throat)  For palpitations- get 24 hour event monitor as she states occurs ngihtly. Had low risk stress test in 2016 through outside hospital. No ekg today as not feeling palpitations and normal rhythm from auscultation  Headache pattern stable for 3 years- will discuss with Dr. Jannifer Franklin at next visit. I do not   For intermittent times of feeling like vision cannot focus as well - advised see eye doctor. She does not have diabetes or take diabetes medicine and doubt hypoglycemia. Could trial nighttiem snack to see if helpful around this time.   Prior abdominal pain was not mentioned by patient  3-4 weeks if not improving. May need cards referral depending on event monitor.   Orders Placed This Encounter  Procedures  . Holter monitor - 24 hour    Standing Status:   Future    Standing Expiration Date:   07/30/2026    Meds ordered this encounter  Medications  . fluticasone (FLONASE) 50 MCG/ACT nasal spray    Sig: Place 2 sprays into both nostrils daily.    Dispense:  16 g    Refill:  6    Return  precautions advised.  [name], MD

## 2016-07-29 NOTE — Progress Notes (Signed)
Pre visit review using our clinic review tool, if applicable. No additional management support is needed unless otherwise documented below in the visit note. 

## 2016-08-08 ENCOUNTER — Telehealth: Payer: Self-pay | Admitting: Oncology

## 2016-08-08 ENCOUNTER — Other Ambulatory Visit: Payer: Medicare Other

## 2016-08-08 ENCOUNTER — Ambulatory Visit: Payer: Medicare Other | Admitting: Oncology

## 2016-08-08 NOTE — Telephone Encounter (Signed)
Rescheduled per inclement weather, per patient. 08/08/16 Confirmed with patient. 08/08/16

## 2016-08-08 NOTE — Telephone Encounter (Signed)
Rescheduled per inclement weather, per patient. 08/08/16

## 2016-08-10 ENCOUNTER — Telehealth: Payer: Self-pay | Admitting: Oncology

## 2016-08-10 ENCOUNTER — Ambulatory Visit (INDEPENDENT_AMBULATORY_CARE_PROVIDER_SITE_OTHER): Payer: Medicare Other

## 2016-08-10 DIAGNOSIS — R002 Palpitations: Secondary | ICD-10-CM

## 2016-08-10 NOTE — Telephone Encounter (Signed)
Per GBS/desk nurse moved patient 3/22 f/u from GBS to LT - will send schedule message. Spoke with patient re change and new time for lab/LT 3/22 @ 11:15 am.

## 2016-08-11 DIAGNOSIS — F332 Major depressive disorder, recurrent severe without psychotic features: Secondary | ICD-10-CM | POA: Diagnosis not present

## 2016-08-12 ENCOUNTER — Telehealth: Payer: Self-pay | Admitting: *Deleted

## 2016-08-12 NOTE — Telephone Encounter (Signed)
Message from pt: "Is it safe to start on a diet? My weight has gone up to 155 lbs." Pt is considering Nutrisystem diet and wants to know how much weight is safe for her to lose.

## 2016-08-15 ENCOUNTER — Other Ambulatory Visit: Payer: Self-pay | Admitting: Neurology

## 2016-08-16 ENCOUNTER — Telehealth: Payer: Self-pay | Admitting: Nutrition

## 2016-08-16 NOTE — Telephone Encounter (Signed)
Patient contacted nursing regarding safety of weight loss after treatment for cancer and Whipple surgery in 2015. Contacted patient by phone who reports she is interested in following the [facility]. She finds this easier since they plan meals for her. I educated patient that it would be fine to strive for a slow safe weight loss of no more than 2 pounds weekly. Discouraged patient from limiting major food groups. Questions were answered.  Teach back method used.  Contact information was given for questions.

## 2016-08-17 NOTE — Telephone Encounter (Signed)
Late entry for 3/16: Discussed pt's call with Dr. [name]. Informed her they will discuss at next visit. Appts confirmed.

## 2016-08-18 ENCOUNTER — Other Ambulatory Visit: Payer: Medicare Other

## 2016-08-18 ENCOUNTER — Telehealth: Payer: Self-pay | Admitting: Nurse Practitioner

## 2016-08-18 ENCOUNTER — Ambulatory Visit (HOSPITAL_BASED_OUTPATIENT_CLINIC_OR_DEPARTMENT_OTHER): Payer: Medicare Other | Admitting: Nurse Practitioner

## 2016-08-18 VITALS — BP 140/76 | HR 82 | Temp 98.7°F | Resp 18 | Ht 61.0 in | Wt 155.6 lb

## 2016-08-18 DIAGNOSIS — Z8507 Personal history of malignant neoplasm of pancreas: Secondary | ICD-10-CM | POA: Diagnosis not present

## 2016-08-18 DIAGNOSIS — D696 Thrombocytopenia, unspecified: Secondary | ICD-10-CM | POA: Diagnosis not present

## 2016-08-18 DIAGNOSIS — C250 Malignant neoplasm of head of pancreas: Secondary | ICD-10-CM | POA: Diagnosis not present

## 2016-08-18 NOTE — Telephone Encounter (Signed)
Gave patient AVS and calender per 3/22 los 

## 2016-08-18 NOTE — Progress Notes (Addendum)
  [name] OFFICE PROGRESS NOTE   Diagnosis:  Pancreas cancer  INTERVAL HISTORY:   [name] returns as scheduled. She reports a good appetite. She is gaining weight. She is considering beginning the Nutrisystem diet. She denies abdominal pain. No nausea or vomiting. No change in bowel habits. She notes occasional abdominal "soreness". She attributes the soreness to an abdominal hernia. For the past 2 months she has noticed intermittent pruritus.  Objective:  Vital signs in last 24 hours:  Blood pressure 140/76, pulse 82, temperature 98.7 F (37.1 C), temperature source Oral, resp. rate 18, height 5\' 1"  (1.549 m), weight 155 lb 9.6 oz (70.6 kg), SpO2 100 %.    HEENT: Neck without mass. Lymphatics: No palpable cervical, supraclavicular, axillary or inguinal lymph nodes. Resp: Lungs clear bilaterally. Cardio: Regular rate and rhythm. GI: Abdomen is soft. Mild generalized tenderness. No hepatomegaly. No mass. Vascular: No leg edema. Skin: No rash.    Lab Results:  Lab Results  Component Value Date   WBC 8.3 12/14/2015   HGB 11.8 (L) 12/14/2015   HCT 36.3 12/14/2015   MCV 94.3 12/14/2015   PLT 149 (L) 12/14/2015   NEUTROABS 7.1 10/22/2015     Medications: I have reviewed the patient's current medications.  Assessment/Plan: 1. 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  CT abdomen/pelvis 01/19/2015 with no evidence of recurrent/metastatic disease.  2. Nausea following the Whipple procedure-improved; denies nausea 08/18/2016 3. Mild thrombocytopenia, chronic   Disposition: [name] remains in clinical remission  from pancreas cancer. We will follow-up on the CA-19-9 from today. She will return for a follow-up visit and CA-19-9 in 6 months. She will contact the office in the interim with any problems.  Patient seen with Dr. [name].  Ned Card ANP/GNP-BC   08/18/2016  11:49 AM  This was a shared visit with Ned Card. [name] remains in clinical remission from pancreas cancer. She will return for an office visit in 6 months. Julieanne Manson, M.D.

## 2016-08-19 ENCOUNTER — Encounter: Payer: Self-pay | Admitting: Family Medicine

## 2016-08-19 LAB — CANCER ANTIGEN 19-9: CAN 19-9: 1 U/mL (ref 0–35)

## 2016-09-07 ENCOUNTER — Other Ambulatory Visit: Payer: Self-pay | Admitting: Cardiology

## 2016-09-07 NOTE — Telephone Encounter (Signed)
Rx(s) sent to pharmacy electronically.  

## 2016-09-12 DIAGNOSIS — H02403 Unspecified ptosis of bilateral eyelids: Secondary | ICD-10-CM | POA: Diagnosis not present

## 2016-09-12 DIAGNOSIS — Z881 Allergy status to other antibiotic agents status: Secondary | ICD-10-CM | POA: Diagnosis not present

## 2016-09-12 DIAGNOSIS — H02535 Eyelid retraction left lower eyelid: Secondary | ICD-10-CM | POA: Diagnosis not present

## 2016-09-12 DIAGNOSIS — Z9841 Cataract extraction status, right eye: Secondary | ICD-10-CM | POA: Diagnosis not present

## 2016-09-12 DIAGNOSIS — H02833 Dermatochalasis of right eye, unspecified eyelid: Secondary | ICD-10-CM | POA: Diagnosis not present

## 2016-09-12 DIAGNOSIS — Z886 Allergy status to analgesic agent status: Secondary | ICD-10-CM | POA: Diagnosis not present

## 2016-09-12 DIAGNOSIS — Z961 Presence of intraocular lens: Secondary | ICD-10-CM | POA: Diagnosis not present

## 2016-09-12 DIAGNOSIS — H02401 Unspecified ptosis of right eyelid: Secondary | ICD-10-CM | POA: Diagnosis not present

## 2016-09-12 DIAGNOSIS — H02836 Dermatochalasis of left eye, unspecified eyelid: Secondary | ICD-10-CM | POA: Diagnosis not present

## 2016-09-12 DIAGNOSIS — H52209 Unspecified astigmatism, unspecified eye: Secondary | ICD-10-CM | POA: Diagnosis not present

## 2016-09-12 DIAGNOSIS — Z79899 Other long term (current) drug therapy: Secondary | ICD-10-CM | POA: Diagnosis not present

## 2016-09-12 DIAGNOSIS — H5210 Myopia, unspecified eye: Secondary | ICD-10-CM | POA: Diagnosis not present

## 2016-09-12 DIAGNOSIS — Z888 Allergy status to other drugs, medicaments and biological substances status: Secondary | ICD-10-CM | POA: Diagnosis not present

## 2016-09-12 DIAGNOSIS — H02532 Eyelid retraction right lower eyelid: Secondary | ICD-10-CM | POA: Diagnosis not present

## 2016-09-12 DIAGNOSIS — H353131 Nonexudative age-related macular degeneration, bilateral, early dry stage: Secondary | ICD-10-CM | POA: Diagnosis not present

## 2016-09-12 DIAGNOSIS — Z9889 Other specified postprocedural states: Secondary | ICD-10-CM | POA: Diagnosis not present

## 2016-09-12 DIAGNOSIS — Z7982 Long term (current) use of aspirin: Secondary | ICD-10-CM | POA: Diagnosis not present

## 2016-09-12 DIAGNOSIS — H2512 Age-related nuclear cataract, left eye: Secondary | ICD-10-CM | POA: Diagnosis not present

## 2016-09-12 DIAGNOSIS — E039 Hypothyroidism, unspecified: Secondary | ICD-10-CM | POA: Diagnosis not present

## 2016-09-12 DIAGNOSIS — Z8673 Personal history of transient ischemic attack (TIA), and cerebral infarction without residual deficits: Secondary | ICD-10-CM | POA: Diagnosis not present

## 2016-09-12 DIAGNOSIS — Z882 Allergy status to sulfonamides status: Secondary | ICD-10-CM | POA: Diagnosis not present

## 2016-09-12 DIAGNOSIS — H26491 Other secondary cataract, right eye: Secondary | ICD-10-CM | POA: Diagnosis not present

## 2016-09-12 DIAGNOSIS — H401132 Primary open-angle glaucoma, bilateral, moderate stage: Secondary | ICD-10-CM | POA: Diagnosis not present

## 2016-09-12 DIAGNOSIS — H524 Presbyopia: Secondary | ICD-10-CM | POA: Diagnosis not present

## 2016-09-12 DIAGNOSIS — I10 Essential (primary) hypertension: Secondary | ICD-10-CM | POA: Diagnosis not present

## 2016-09-13 ENCOUNTER — Encounter: Payer: Self-pay | Admitting: Family Medicine

## 2016-09-13 ENCOUNTER — Ambulatory Visit (INDEPENDENT_AMBULATORY_CARE_PROVIDER_SITE_OTHER): Payer: Medicare Other | Admitting: Family Medicine

## 2016-09-13 VITALS — HR 93 | Temp 98.4°F | Ht 61.0 in | Wt 152.2 lb

## 2016-09-13 DIAGNOSIS — K120 Recurrent oral aphthae: Secondary | ICD-10-CM | POA: Diagnosis not present

## 2016-09-13 DIAGNOSIS — R262 Difficulty in walking, not elsewhere classified: Secondary | ICD-10-CM

## 2016-09-13 DIAGNOSIS — S93492A Sprain of other ligament of left ankle, initial encounter: Secondary | ICD-10-CM | POA: Diagnosis not present

## 2016-09-13 MED ORDER — MAGIC MOUTHWASH W/LIDOCAINE: 5.0000 mL | Freq: Three times a day (TID) | ORAL | 0 refills | Status: AC | PRN

## 2016-09-13 NOTE — Progress Notes (Signed)
Pre visit review using our clinic review tool, if applicable. No additional management support is needed unless otherwise documented below in the visit note. 

## 2016-09-13 NOTE — Progress Notes (Signed)
[name] is a 76 y.o. female is here to [facility].   History of Present Illness:   Water quality scientist, CMA, acting as scribe for [name].  Fall  The accident occurred 12 to 24 hours ago. The fall occurred while walking. She landed on concrete. There was no blood loss. The point of impact was the head, left knee, right knee and left foot. The pain is present in the head, left knee, right knee and left foot. The pain is at a severity of 6/10. The pain is moderate. The symptoms are aggravated by ambulation. Pertinent negatives include no fever, headaches, hearing loss, loss of consciousness, nausea, numbness, visual change or vomiting. She has tried ice for the symptoms. The treatment provided mild relief.    Health Maintenance Due  Topic Date Due  . TETANUS/TDAP  08/29/1959    PMHx, SurgHx, SocialHx, Medications, and Allergies were reviewed in the Visit Navigator and updated as appropriate.   Past Medical History:  Diagnosis Date  . Allergic rhinitis   . Cancer ([place]) 07/10/13   Pancreatic cancer with MRI scan 06-19-13  . Chronic maxillary sinusitis    neti pot  . Depression    alone a lot  . Eustachian tube dysfunction   . GERD (gastroesophageal reflux disease)   . Glaucoma   . Heart murmur    hx. "MVP" -predental antibiotics  . Hiatal hernia   . Hypertension   . Hypothyroid   . Memory loss    short term memory loss  . Mitral valve prolapse    antibiotics before dental procedures  . Neuropathy   . Osteoarthritis   . Stroke [facility])    mini storkes left leg paraylsis. patient denies weakness 01/08/14.   . Tardive dyskinesia    possibly reglan, vitamin E helps  . Urgency of urination    some UTI in past    Past Surgical History:  Procedure Laterality Date  . 1 baker cyst removed    . ABDOMINAL HYSTERECTOMY     including ovaries  . BACK SURGERY     fusion  . BLEPHAROPLASTY Bilateral    with cataract surgery  . BREAST SURGERY     Biopsy left 2 times  .  COLONOSCOPY W/ POLYPECTOMY     2004 last colonoscopy, no polyps  . DILATION AND CURETTAGE OF UTERUS     x3  . ESOPHAGOGASTRODUODENOSCOPY N/A 09/11/2013   Procedure: ESOPHAGOGASTRODUODENOSCOPY (EGD);  Surgeon: Cleotis Nipper, MD;  Location: [facility] ENDOSCOPY;  Service: Endoscopy;  Laterality: N/A;  Moderate sedation okay if MAC not available  . EUS N/A 07/10/2013   Procedure: ESOPHAGEAL ENDOSCOPIC ULTRASOUND (EUS) RADIAL;  Surgeon: Arta Silence, MD;  [facility]: [facility] ENDOSCOPY;  Service: Endoscopy;  Laterality: N/A;  . EYE SURGERY Right    cataract  . FINE NEEDLE ASPIRATION N/A 07/10/2013   Procedure: FINE NEEDLE ASPIRATION (FNA) LINEAR;  Surgeon: Arta Silence, MD;  Location: [facility] ENDOSCOPY;  Service: Endoscopy;  Laterality: N/A;  possible fna  . JOINT REPLACEMENT     LTKA  . KNEE SURGERY Left    x 5, total knee Left knee  . LAPAROSCOPY N/A 08/07/2013   Procedure: LAPAROSCOPY DIAGNOSTIC;  Surgeon: [name], MD;  Location: [place];  Service: General;  Laterality: N/A;  . LUMBAR SPINE SURGERY     x2  . LUMBAR SPINE SURGERY     cyst  . ORIF ANKLE FRACTURE Right 08/16/2015   Procedure: OPEN REDUCTION INTERNAL FIXATION (ORIF) ANKLE FRACTURE;  Surgeon:  Meredith Pel, MD;  Location: [place];  Service: Orthopedics;  Laterality: Right;  . RADIOACTIVE SEED GUIDED EXCISIONAL BREAST BIOPSY Left 12/15/2015   Procedure: LEFT RADIOACTIVE SEED GUIDED EXCISIONAL BREAST BIOPSY;  Surgeon: [name], MD;  Location: [place];  Service: General;  Laterality: Left;  . WHIPPLE PROCEDURE N/A 08/07/2013   Procedure: WHIPPLE PROCEDURE;  Surgeon: [name], MD;  [facility] OR;  Service: General;  Laterality: N/A;    Family History  Problem Relation Age of Onset  . Cancer Mother     Breast Cancer with Metastatic disease  . Alzheimer's disease Father   . Other Brother 63    GSW   Social History  Substance Use Topics  . Smoking status: Never Smoker  . Smokeless tobacco: Never Used  . Alcohol use No    Current Medications and Allergies:   Current Outpatient Prescriptions:  .  aspirin 81 MG tablet, Take 81 mg by mouth daily., Disp: , Rfl:  .  atenolol (TENORMIN) 25 MG tablet, TAKE 1 TABLET BY MOUTH  EVERY MORNING, Disp: 90 tablet, Rfl: 1 .  busPIRone (BUSPAR) 15 MG tablet, Take 15 mg by mouth at bedtime., Disp: , Rfl:  .  Calcium Carbonate (CALCIUM 600 PO), Take 1 tablet by mouth daily., Disp: , Rfl:  .  carboxymethylcellulose (REFRESH TEARS) 0.5 % SOLN, Place 1 drop into both eyes 2 (two) times daily. , Disp: , Rfl:  .  cholecalciferol (VITAMIN D) 1000 UNITS tablet, Take 1,000 Units by mouth 2 (two) times daily. , Disp: , Rfl:  .  cyanocobalamin 2000 MCG tablet, Take 2,000 mcg by mouth daily at 12 noon. , Disp: , Rfl:  .  docusate sodium (COLACE) 100 MG capsule, Take 200 mg by mouth at bedtime., Disp: , Rfl:  .  DULoxetine (CYMBALTA) 30 MG capsule, Take 1 capsule (30 mg total) by mouth 2 (two) times daily., Disp: 180 capsule, Rfl: 3 .  feeding supplement (BOOST HIGH PROTEIN) LIQD, Take 1 Container by mouth daily., Disp: , Rfl:  .  FLUoxetine (PROZAC) 20 MG capsule, Take 1 capsule (20 mg total) by mouth daily., Disp: 90 capsule, Rfl: 2 .  fluticasone (FLONASE) 50 MCG/ACT nasal spray, Place 2 sprays into both nostrils daily., Disp: 16 g, Rfl: 6 .  furosemide (LASIX) 20 MG tablet, Take 1 tablet (20 mg total) by mouth daily., Disp: 90 tablet, Rfl: 3 .  guaiFENesin 200 MG tablet, Take 1 tablet (200 mg total) by mouth every 6 (six) hours as needed for cough or to loosen phlegm., Disp: 30 tablet, Rfl: 0 .  HYDROcodone-acetaminophen (NORCO/VICODIN) 5-325 MG tablet, Take 1-2 tablets by mouth every 6 (six) hours as needed for moderate pain., Disp: 30 tablet, Rfl: 0 .  ibuprofen (ADVIL,MOTRIN) 200 MG tablet, Take 200 mg by mouth every 6 (six) hours as needed for moderate pain., Disp: , Rfl:  .  latanoprost (XALATAN) 0.005 % ophthalmic solution, Place 1 drop into both eyes nightly., Disp: , Rfl:  .   levothyroxine (SYNTHROID, LEVOTHROID) 175 MCG tablet, TAKE 1 TABLET BY MOUTH  DAILY BEFORE BREAKFAST, Disp: 90 tablet, Rfl: 1 .  lipase/protease/amylase (CREON) 12000 UNITS CPEP capsule, Take 2 capsules (24,000 Units total) by mouth 3 (three) times daily before meals. (Patient taking differently: Take 12,000 Units by mouth 3 (three) times daily before meals. ), Disp: 270 capsule, Rfl: 5 .  LORazepam (ATIVAN) 0.5 MG tablet, Take 0.5 mg by mouth every 8 (eight) hours., Disp: , Rfl:  .  lovastatin (  MEVACOR) 40 MG tablet, TAKE 1 TABLET BY MOUTH AT  BEDTIME, Disp: 90 tablet, Rfl: 1 .  LYRICA 300 MG capsule, TAKE 1 CAPSULE BY MOUTH 2 TIMES DAILY, Disp: 180 capsule, Rfl: 0 .  Multiple Vitamin (MULTIVITAMIN WITH MINERALS) TABS tablet, Take 1 tablet by mouth daily., Disp: , Rfl:  .  omeprazole (PRILOSEC OTC) 20 MG tablet, Take 20 mg by mouth 2 (two) times daily. , Disp: , Rfl:  .  polycarbophil (FIBERCON) 625 MG tablet, Take 625 mg by mouth daily. , Disp: , Rfl:  .  potassium chloride (KLOR-CON 10) 10 MEQ tablet, Take 1 tablet (10 mEq total) by mouth daily. PLEASE CONTACT OFFICE FOR ADDITIONAL REFILLS, Disp: 15 tablet, Rfl: 0 .  pregabalin (LYRICA) 100 MG capsule, Take 1 capsule (100 mg total) by mouth 2 (two) times daily., Disp: 60 capsule, Rfl: 3 .  promethazine (PHENERGAN) 25 MG tablet, Take 25 mg by mouth every 6 (six) hours as needed for nausea or vomiting., Disp: , Rfl:  .  psyllium (REGULOID) 0.52 g capsule, Take 0.52 g by mouth daily., Disp: , Rfl:  .  rOPINIRole (REQUIP) 0.5 MG tablet, Take 1 tablet (0.5 mg total) by mouth at bedtime., Disp: 30 tablet, Rfl: 6 .  simethicone (MYLICON) 124 MG chewable tablet, Chew 125 mg by mouth every 6 (six) hours as needed for flatulence., Disp: , Rfl:  .  vitamin E 400 UNIT capsule, Take 800 Units by mouth 2 (two) times daily. For tartive dyskinesia, Disp: , Rfl:   Allergies  Allergen Reactions  . Other Other (See Comments)    According to patient Trilafor and  Reglan cause Tardive Dyskinesia  . Metoclopramide Hcl Other (See Comments)    Shaking; caused tardive dyskinesia  . Niacin Other (See Comments)    shaking  . Trovan [Alatrofloxacin Mesylate] Other (See Comments)    Caused shaking and nervousness  . Benzocaine-Resorcinol Rash  . Celecoxib Other (See Comments)    unknown  . Erythromycin Base Rash  . Glucosamine Other (See Comments)    unknown  . Nortriptyline Other (See Comments)    Dizziness   . Phenazopyridine Hcl Other (See Comments)    unknown  . Sulfa Antibiotics Nausea And Vomiting and Rash  . Sulfonamide Derivatives Nausea And Vomiting and Rash   Review of Systems:   Review of Systems  Constitutional: Negative for chills and fever.  HENT: Negative for congestion.   Respiratory: Negative for shortness of breath.   Cardiovascular: Negative for chest pain and palpitations.  Gastrointestinal: Negative for nausea and vomiting.  Genitourinary: Negative for frequency.  Musculoskeletal: Positive for falls, joint pain and myalgias.  Skin: Negative for rash.  Neurological: Negative for loss of consciousness, numbness and headaches.  Psychiatric/Behavioral: Negative for depression. The patient is not nervous/anxious.     Vitals:   Vitals:   09/13/16 1303  Pulse: 93  Temp: 98.4 F (36.9 C)  TempSrc: Oral  SpO2: 95%  Weight: 152 lb 3.2 oz (69 kg)  Height: 5' 1"  (1.549 m)     Body mass index is 28.76 kg/m.  Physical Exam:   Physical Exam  Constitutional: She appears well-developed and well-nourished.  HENT:  Head: Normocephalic and atraumatic.  Mouth/Throat: Oral lesions present.  Eyes: EOM are normal. Pupils are equal, round, and reactive to light.  Neck: Normal range of motion. Neck supple.  Cardiovascular: Normal rate, regular rhythm, normal heart sounds and intact distal pulses.   Pulmonary/Chest: Effort normal.  Abdominal: Soft.  Musculoskeletal:  Left ankle: She exhibits no swelling, no ecchymosis and  no deformity. Tenderness. Lateral malleolus tenderness found.  Skin: Skin is warm.  Psychiatric: She has a normal mood and affect. Her behavior is normal.  Nursing note and vitals reviewed.    Assessment and Plan:    Chanya was seen today for establish care, fall and headache.  Diagnoses and all orders for this visit:  Sprain of anterior talofibular ligament of left ankle, initial encounter -     Ambulatory referral to Physical Therapy  Difficulty walking -     Ambulatory referral to Physical Therapy  Aphthous ulcer -     magic mouthwash w/lidocaine SOLN; Take 5 mLs by mouth 3 (three) times daily as needed for mouth pain.    . Reviewed expectations re: course of current medical issues. . Discussed self-management of symptoms. . Outlined signs and symptoms indicating need for more acute intervention. . Patient verbalized understanding and all questions were answered. . See orders for this visit as documented in the electronic medical record. . Patient received an After Visit Summary.  Records requested if needed. I spent 30 minutes with this patient, greater than 50% was face-to-face time counseling regarding the above diagnoses.  CMA served as Education administrator during this visit. History, Physical, and Plan performed by medical provider. Documentation and orders reviewed and attested to. Briscoe Deutscher, D.O.  Briscoe Deutscher, [place], [place] 09/21/2016   Follow-up: No Follow-up on file.  Meds ordered this encounter  Medications  . magic mouthwash w/lidocaine SOLN    Sig: Take 5 mLs by mouth 3 (three) times daily as needed for mouth pain.    Dispense:  60 mL    Refill:  0   There are no discontinued medications. Orders Placed This Encounter  Procedures  . Ambulatory referral to Physical Therapy

## 2016-09-14 NOTE — Progress Notes (Signed)
Pre visit review using our clinic review tool, if applicable. No additional management support is needed unless otherwise documented below in the visit note. 

## 2016-09-14 NOTE — Progress Notes (Addendum)
Subjective:   [name] is a 76 y.o. female who presents for Medicare Annual (Subsequent) preventive examination.  Review of Systems:  No ROS.  Medicare Wellness Visit.  Cardiac Risk Factors include: advanced age (>34mn, >>63women);hypertension;sedentary lifestyle   Home Safety/Smoke Alarms: Feels safe in home. Smoke alarms in place.  Living environment; residence and Firearm Safety:  Has raised toilet seat, antislip bubbles in tub, shower chair. Has hand rails on almost all walk ways. Has security system.  Seat Belt Safety/Bike Helmet: Wears seat belt.   Counseling:   Eye Exam-  Goes yearly, last exam was 09/12/16 Dental- States that she went this week. Goes every 6 months.   Female:   Pap- Aged out.      Mammo-  Seed biopsy through breast center. Pt states that the breast center manages all of her mammograms.      Dexa scan-    06/27/2010. Order placed today.  [facility]S-11/18/2014. Normal. 10 year recall.     Objective:     Vitals: BP 138/78 (BP Location: Left Arm, Patient Position: Sitting, Cuff Size: Normal)   Pulse 66   Resp 14   Ht 5' 1"  (1.549 m)   Wt 150 lb 9.6 oz (68.3 kg)   SpO2 97%   BMI 28.46 kg/m   Body mass index is 28.46 kg/m.   Tobacco History  Smoking Status  . Never Smoker  Smokeless Tobacco  . Never Used     Counseling given: Not Answered   Past Medical History:  Diagnosis Date  . Allergic rhinitis   [place]   Pancreatic cancer with MRI scan 06-19-13  . Chronic maxillary sinusitis    neti pot  . Depression    alone a lot  . Eustachian tube dysfunction   . GERD (gastroesophageal reflux disease)   . Glaucoma   . Heart murmur    hx. "MVP" -predental antibiotics  . Hiatal hernia   . Hypertension   . Hypothyroid   . Memory loss    short term memory loss  . Mitral valve prolapse    antibiotics before dental procedures  . Neuropathy   . Osteoarthritis   . Stroke ([facility]    mini storkes left leg paraylsis. patient denies  weakness 01/08/14.   . Tardive dyskinesia    possibly reglan, vitamin E helps  . Urgency of urination    some UTI in past   Past Surgical History:  Procedure Laterality Date  . 1 baker cyst removed    . ABDOMINAL HYSTERECTOMY     including ovaries  . BACK SURGERY     fusion  . BLEPHAROPLASTY Bilateral    with cataract surgery  . BREAST SURGERY     Biopsy left 2 times  . COLONOSCOPY W/ POLYPECTOMY     2004 last colonoscopy, no polyps  . DILATION AND CURETTAGE OF UTERUS     x3  . ESOPHAGOGASTRODUODENOSCOPY N/A 09/11/2013   Procedure: ESOPHAGOGASTRODUODENOSCOPY (EGD);  Surgeon: RCleotis Nipper MD;  [facility];  Service: Endoscopy;  Laterality: N/A;  Moderate sedation okay if MAC not available  . EUS N/A 07/10/2013   Procedure: ESOPHAGEAL ENDOSCOPIC ULTRASOUND (EUS) RADIAL;  Surgeon: WArta Silence MD;  [facility]OS[facility];  Service: Endoscopy;  Laterality: N/A;  . EYE SURGERY Right    cataract  . FINE NEEDLE ASPIRATION N/A 07/10/2013   Procedure: FINE NEEDLE ASPIRATION (FNA) LINEAR;  Surgeon: WArta Silence MD;  [facility] ENDOSCOPY;  Service: Endoscopy;  Laterality:  N/A;  possible fna  . JOINT REPLACEMENT     LTKA  . KNEE SURGERY Left    x 5, total knee Left knee  . LAPAROSCOPY N/A 08/07/2013   Procedure: LAPAROSCOPY DIAGNOSTIC;  Surgeon: [name], MD;  Location: [place];  Service: General;  Laterality: N/A;  . LUMBAR SPINE SURGERY     x2  . LUMBAR SPINE SURGERY     cyst  . ORIF ANKLE FRACTURE Right 08/16/2015   Procedure: OPEN REDUCTION INTERNAL FIXATION (ORIF) ANKLE FRACTURE;  Surgeon: Meredith Pel, MD;  Location: [place];  Service: Orthopedics;  Laterality: Right;  . RADIOACTIVE SEED GUIDED EXCISIONAL BREAST BIOPSY Left 12/15/2015   Procedure: LEFT RADIOACTIVE SEED GUIDED EXCISIONAL BREAST BIOPSY;  Surgeon: [name], MD;  Location: [place];  Service: General;  Laterality: Left;  . WHIPPLE PROCEDURE N/A 08/07/2013   Procedure: WHIPPLE PROCEDURE;   Surgeon: [name], MD;  [facility];  Service: General;  Laterality: N/A;   Family History  Problem Relation Age of Onset  . Cancer Mother     Breast Cancer with Metastatic disease  . Alzheimer's disease Father   . Other Brother 30    GSW   History  Sexual Activity  . Sexual activity: Not Currently    Outpatient Encounter Prescriptions as of 09/15/2016  Medication Sig  . aspirin 81 MG tablet Take 81 mg by mouth daily.  Marland Kitchen atenolol (TENORMIN) 25 MG tablet TAKE 1 TABLET BY MOUTH  EVERY MORNING  . busPIRone (BUSPAR) 15 MG tablet Take 15 mg by mouth at bedtime.  . Calcium Carbonate (CALCIUM 600 PO) Take 1 tablet by mouth daily.  . carboxymethylcellulose (REFRESH TEARS) 0.5 % SOLN Place 1 drop into both eyes 2 (two) times daily.   . cholecalciferol (VITAMIN D) 1000 UNITS tablet Take 1,000 Units by mouth 2 (two) times daily.   . cyanocobalamin 2000 MCG tablet Take 2,000 mcg by mouth daily at 12 noon.   . docusate sodium (COLACE) 100 MG capsule Take 200 mg by mouth at bedtime.  . DULoxetine (CYMBALTA) 30 MG capsule Take 1 capsule (30 mg total) by mouth 2 (two) times daily.  . feeding supplement (BOOST HIGH PROTEIN) LIQD Take 1 Container by mouth daily.  Marland Kitchen FLUoxetine (PROZAC) 20 MG capsule Take 1 capsule (20 mg total) by mouth daily.  . fluticasone (FLONASE) 50 MCG/ACT nasal spray Place 2 sprays into both nostrils daily.  . furosemide (LASIX) 20 MG tablet Take 1 tablet (20 mg total) by mouth daily.  Marland Kitchen guaiFENesin 200 MG tablet Take 1 tablet (200 mg total) by mouth every 6 (six) hours as needed for cough or to loosen phlegm.  Marland Kitchen HYDROcodone-acetaminophen (NORCO/VICODIN) 5-325 MG tablet Take 1-2 tablets by mouth every 6 (six) hours as needed for moderate pain.  Marland Kitchen ibuprofen (ADVIL,MOTRIN) 200 MG tablet Take 200 mg by mouth every 6 (six) hours as needed for moderate pain.  Marland Kitchen latanoprost (XALATAN) 0.005 % ophthalmic solution Place 1 drop into both eyes nightly.  . levothyroxine (SYNTHROID,  LEVOTHROID) 175 MCG tablet TAKE 1 TABLET BY MOUTH  DAILY BEFORE BREAKFAST  . lipase/protease/amylase (CREON) 12000 UNITS CPEP capsule Take 2 capsules (24,000 Units total) by mouth 3 (three) times daily before meals. (Patient taking differently: Take 12,000 Units by mouth 3 (three) times daily before meals. )  . LORazepam (ATIVAN) 0.5 MG tablet Take 0.5 mg by mouth every 8 (eight) hours.  . lovastatin (MEVACOR) 40 MG tablet TAKE 1 TABLET BY MOUTH AT  BEDTIME  .  LYRICA 300 MG capsule TAKE 1 CAPSULE BY MOUTH 2 TIMES DAILY  . magic mouthwash w/lidocaine SOLN Take 5 mLs by mouth 3 (three) times daily as needed for mouth pain.  . Multiple Vitamin (MULTIVITAMIN WITH MINERALS) TABS tablet Take 1 tablet by mouth daily.  Marland Kitchen omeprazole (PRILOSEC OTC) 20 MG tablet Take 20 mg by mouth 2 (two) times daily.   . polycarbophil (FIBERCON) 625 MG tablet Take 625 mg by mouth daily.   . potassium chloride (KLOR-CON 10) 10 MEQ tablet Take 1 tablet (10 mEq total) by mouth daily. PLEASE CONTACT OFFICE FOR ADDITIONAL REFILLS  . pregabalin (LYRICA) 100 MG capsule Take 1 capsule (100 mg total) by mouth 2 (two) times daily.  . promethazine (PHENERGAN) 25 MG tablet Take 25 mg by mouth every 6 (six) hours as needed for nausea or vomiting.  . psyllium (REGULOID) 0.52 g capsule Take 0.52 g by mouth daily.  Marland Kitchen rOPINIRole (REQUIP) 0.5 MG tablet Take 1 tablet (0.5 mg total) by mouth at bedtime.  . simethicone (MYLICON) 096 MG chewable tablet Chew 125 mg by mouth every 6 (six) hours as needed for flatulence.  . vitamin E 400 UNIT capsule Take 800 Units by mouth 2 (two) times daily. For tartive dyskinesia   No facility-administered encounter medications on file as of 09/15/2016.     Activities of Daily Living In your present state of health, do you have any difficulty performing the following activities: 09/15/2016 12/14/2015  Hearing? N Y  Vision? N Y  Difficulty concentrating or making decisions? N N  Walking or climbing stairs? N  Y  Dressing or bathing? N -  Doing errands, shopping? N -  Preparing Food and eating ? N -  Using the Toilet? N -  In the past six months, have you accidently leaked urine? N -  Do you have problems with loss of bowel control? N -  Managing your Medications? N -  Managing your Finances? N -  Housekeeping or managing your Housekeeping? N -  Some recent data might be hidden    Patient Care Team: Briscoe Deutscher, DO as PCP - General (Family Medicine) Marcial Pacas, MD (Neurology) Ladell Pier, MD as Consulting Physician (Oncology)    Assessment:    Physical assessment deferred to PCP.  Exercise Activities and Dietary recommendations Current Exercise Habits: The patient does not participate in regular exercise at present, Exercise limited by: orthopedic condition(s)   Diet (meal preparation, eat out, water intake, caffeinated beverages, dairy products, fruits and vegetables): 2-4 cups decaf coffee/day. 3 glasses water/day. Drinks 2-3 glasses tea/day.  Breakfast: Bowl cheerios or egg and toast, sometimes bacon Lunch: Sandwich Dinner: Meat and two vegetables  Snacks on cheese crackers, fruit, chips. States she does not buy chips too often.  Starts [employer]ystem at the end of the week. States they are sending 3 meals/day and snacks.   Goals    . Increase exercise to 5 days/week      Fall Risk Fall Risk  09/15/2016 09/13/2016 07/31/2015 05/14/2014 04/16/2014  Falls in the past year? Yes Yes No No No  Number falls in past yr: 2 or more 2 or more - - -  Injury with Fall? Yes Yes - - -  Risk Factor Category  High Fall Risk High Fall Risk - - -  Risk for fall due to : Impaired balance/gait;History of fall(s);Impaired vision;Impaired mobility Impaired vision - - -  Follow up Falls prevention discussed;Education provided - - - -   Depression Screen  PHQ 2/9 Scores 09/15/2016 09/13/2016 07/31/2015 05/14/2014  PHQ - 2 Score 0 0 0 0     Cognitive Function MMSE - Mini Mental State Exam 09/15/2016    Orientation to time 5  Orientation to Place 5  Registration 3  Attention/ Calculation 1  Recall 3  Language- name 2 objects 2  Language- repeat 1  Language- follow 3 step command 3  Language- read & follow direction 1  Write a sentence 1  Copy design 1  Total score 26        Immunization History  Administered Date(s) Administered  . Influenza Whole 02/22/2008, 03/31/2009, 04/06/2010, 04/13/2012  . Influenza, High Dose Seasonal PF 03/25/2016  . Influenza,inj,Quad PF,36+ Mos 04/16/2014, 02/12/2015  . Pneumococcal Conjugate-13 09/02/2014  . Pneumococcal Polysaccharide-23 06/09/2008   Screening Tests Health Maintenance  Topic Date Due  . TETANUS/TDAP  08[birth year]29/1959  . INFLUENZA VACCINE  12/28/2016  . DEXA SCAN  Completed  . PNA vac Low Risk Adult  Completed      Plan:    Bring a copy of your advance directives to your next office visit. Follow up with PCP as directed.  During the course of the visit the patient was educated and counseled about the following appropriate screening and preventive services:   Vaccines to include Pneumoccal, Influenza, Hepatitis B, Td, Zostavax, HCV  Cardiovascular Disease  Colorectal cancer screening  Bone density screening  Diabetes screening  Glaucoma screening  Mammography/PAP  Nutrition counseling   Patient Instructions (the written plan) was given to the patient.   Ree Edman, RN  09/15/2016   I have personally reviewed the Medicare Annual Wellness questionnaire and have noted 1. The patient's medical and social history 2. Their use of alcohol, tobacco or illicit drugs 3. Their current medications and supplements 4. The patient's functional ability including ADL's, fall risks, home safety risks and hearing or visual impairment. 5. Diet and physical activities 6. Evidence for depression or mood disorders 7. Reviewed Updated provider list, see scanned forms and [facility] Snapshot.   The patients weight, height, BMI and  visual acuity have been recorded in the chart I have made referrals, counseling and provided education to the patient based review of the above and I have provided the pt with a written personalized care plan for preventive services.  I have provided the patient with a copy of your personalized plan for preventive services. Instructed to take the time to review along with their updated medication list.  Briscoe Deutscher, D.O. [place], [facility]

## 2016-09-15 ENCOUNTER — Ambulatory Visit (INDEPENDENT_AMBULATORY_CARE_PROVIDER_SITE_OTHER): Payer: Medicare Other | Admitting: Physical Therapy

## 2016-09-15 ENCOUNTER — Ambulatory Visit (INDEPENDENT_AMBULATORY_CARE_PROVIDER_SITE_OTHER): Payer: Medicare Other | Admitting: *Deleted

## 2016-09-15 ENCOUNTER — Encounter: Payer: Self-pay | Admitting: *Deleted

## 2016-09-15 VITALS — BP 138/78 | HR 66 | Resp 14 | Ht 61.0 in | Wt 150.6 lb

## 2016-09-15 DIAGNOSIS — M25572 Pain in left ankle and joints of left foot: Secondary | ICD-10-CM | POA: Diagnosis not present

## 2016-09-15 DIAGNOSIS — M6281 Muscle weakness (generalized): Secondary | ICD-10-CM

## 2016-09-15 DIAGNOSIS — R2689 Other abnormalities of gait and mobility: Secondary | ICD-10-CM | POA: Diagnosis not present

## 2016-09-15 DIAGNOSIS — M25672 Stiffness of left ankle, not elsewhere classified: Secondary | ICD-10-CM | POA: Diagnosis not present

## 2016-09-15 DIAGNOSIS — Z780 Asymptomatic menopausal state: Secondary | ICD-10-CM | POA: Diagnosis not present

## 2016-09-15 DIAGNOSIS — R2681 Unsteadiness on feet: Secondary | ICD-10-CM | POA: Diagnosis not present

## 2016-09-15 DIAGNOSIS — Z0000 Encounter for general adult medical examination without abnormal findings: Secondary | ICD-10-CM | POA: Diagnosis not present

## 2016-09-15 NOTE — Therapy (Addendum)
[facility] [address] [place], [place], [address] Phone: [phone]   Fax:  [phone]  Physical Therapy Evaluation  Patient Details  Name: [name] MRN: [id number] Date of Birth: [birth year] Referring Provider: Dr. Briscoe Deutscher  Encounter Date: 09/15/2016      PT End of Session - 09/15/16 1058    Visit Number 1   Number of Visits 16   Date for PT Re-Evaluation 11/10/16   PT Start Time 1017   PT Stop Time 1057   PT Time Calculation (min) 40 min   Activity Tolerance Patient tolerated treatment well   Behavior During Therapy [facility] for tasks assessed/performed      Past Medical History:  Diagnosis Date  . Allergic rhinitis   . Cancer ([place]) 07/10/13   Pancreatic cancer with MRI scan 06-19-13  . Chronic maxillary sinusitis    neti pot  . Depression    alone a lot  . Eustachian tube dysfunction   . GERD (gastroesophageal reflux disease)   . Glaucoma   . Heart murmur    hx. "MVP" -predental antibiotics  . Hiatal hernia   . Hypertension   . Hypothyroid   . Memory loss    short term memory loss  . Mitral valve prolapse    antibiotics before dental procedures  . Neuropathy   . Osteoarthritis   . Stroke [facility])    mini storkes left leg paraylsis. patient denies weakness 01/08/14.   . Tardive dyskinesia    possibly reglan, vitamin E helps  . Urgency of urination    some UTI in past    Past Surgical History:  Procedure Laterality Date  . 1 baker cyst removed    . ABDOMINAL HYSTERECTOMY     including ovaries  . BACK SURGERY     fusion  . BLEPHAROPLASTY Bilateral    with cataract surgery  . BREAST SURGERY     Biopsy left 2 times  . COLONOSCOPY W/ POLYPECTOMY     2004 last colonoscopy, no polyps  . DILATION AND CURETTAGE OF UTERUS     x3  . ESOPHAGOGASTRODUODENOSCOPY N/A 09/11/2013   Procedure: ESOPHAGOGASTRODUODENOSCOPY (EGD);  Surgeon: Cleotis Nipper, MD;  Location: [facility] ENDOSCOPY;  Service: Endoscopy;   Laterality: N/A;  Moderate sedation okay if MAC not available  . EUS N/A 07/10/2013   Procedure: ESOPHAGEAL ENDOSCOPIC ULTRASOUND (EUS) RADIAL;  Surgeon: Arta Silence, MD;  Location: [facility] ENDOSCOPY;  Service: Endoscopy;  Laterality: N/A;  . EYE SURGERY Right    cataract  . FINE NEEDLE ASPIRATION N/A 07/10/2013   Procedure: FINE NEEDLE ASPIRATION (FNA) LINEAR;  Surgeon: Arta Silence, MD;  Location: [facility] ENDOSCOPY;  Service: Endoscopy;  Laterality: N/A;  possible fna  . JOINT REPLACEMENT     LTKA  . KNEE SURGERY Left    x 5, total knee Left knee  . LAPAROSCOPY N/A 08/07/2013   Procedure: LAPAROSCOPY DIAGNOSTIC;  Surgeon: [name], MD;  Location: [place];  Service: General;  Laterality: N/A;  . LUMBAR SPINE SURGERY     x2  . LUMBAR SPINE SURGERY     cyst  . ORIF ANKLE FRACTURE Right 08/16/2015   Procedure: OPEN REDUCTION INTERNAL FIXATION (ORIF) ANKLE FRACTURE;  Surgeon: Meredith Pel, MD;  Location: [place];  Service: Orthopedics;  Laterality: Right;  . RADIOACTIVE SEED GUIDED EXCISIONAL BREAST BIOPSY Left 12/15/2015   Procedure: LEFT RADIOACTIVE SEED GUIDED EXCISIONAL BREAST BIOPSY;  Surgeon: [name], MD;  Location: [place];  Service: General;  Laterality: Left;  . WHIPPLE PROCEDURE N/A 08/07/2013   Procedure: WHIPPLE PROCEDURE;  Surgeon: [name], MD;  Location: [place];  Service: General;  Laterality: N/A;    There were no vitals filed for this visit.       Subjective Assessment - 09/15/16 1020    Subjective Pt is a 76 y/o female who pesents to OPPT for unsteadiness resulting in a recent fall and sprain of Lt ankle.  Pt reports she she missed a step outside of a drug store and fell hurting her ankle.  Presents today with soft brace, continued pain and difficulty with mobility.   Pertinent History depression, glaucoma, HTN, neuropathy, hx CVA, arthritis, tardive dyskinesia   Limitations Standing;Walking;House hold activities   How long can you stand comfortably? "couple hours"    How long can you walk comfortably? "couple hours"-walking around grocery store with cart   Patient Stated Goals return to square dancing   Currently in Pain? Yes   Pain Score 0-No pain  2-3/10 with activity   Pain Location Ankle   Pain Orientation Left   Pain Descriptors / Indicators Nagging   Pain Type Acute pain   Pain Onset In the past 7 days   Pain Frequency Intermittent   Aggravating Factors  standing, walking   Pain Relieving Factors rest, elevation, medication            [facility] PT Assessment - 09/15/16 1025      Assessment   Medical Diagnosis Lt ankle sprain, unsteadiness   Referring Provider Dr. Briscoe Deutscher   Onset Date/Surgical Date 09/12/16   Next MD Visit PRN   Prior Therapy At [facility] clinic- balance (last year)     Precautions   Precautions Fall     Restrictions   Weight Bearing Restrictions No     Balance Screen   Has the patient fallen in the past 6 months Yes   How many times? 2   Has the patient had a decrease in activity level because of a fear of falling?  No   Is the patient reluctant to leave their home because of a fear of falling?  No     Home Environment   Living Environment Private residence   Living Arrangements Alone   Available Help at Discharge --  son local   Type of [place] to enter   Entrance Stairs-Number of Steps 2   Entrance Stairs-Rails None  reports she plans to have [place] - single point;Bedside commode;Tub bench;Walker - 2 wheels;Hand held shower head     Prior Function   Level of Independence Independent   Vocation Retired   Leisure square dancing; was walking regularly ~ 1 yr ago     Cognition   Overall Cognitive Status Within Functional Limits for tasks assessed     Observation/Other Assessments   Skin Integrity bruising noted medial and lateral Lt ankle     Observation/Other Assessments-Edema    Edema Figure 8     Figure 8  Edema   Figure 8 - Right  46.5 cm   Figure 8 - Left  48.5 cm     Posture/Postural Control   Posture/Postural Control Postural limitations   Postural Limitations Rounded Shoulders;Forward head;Increased thoracic kyphosis     AROM   AROM Assessment Site Ankle   Right/Left Ankle Right;Left   Left Ankle Dorsiflexion 5  from neutral   Left Ankle  Plantar Flexion 40   Left Ankle Inversion 30   Left Ankle Eversion 20     Strength   Overall Strength Comments tested in sitting   Strength Assessment Site Hip;Knee;Ankle   Right/Left Hip Right;Left   Right Hip Flexion 3+/5   Left Hip Flexion 3/5   Right/Left Knee Right;Left   Right Knee Flexion 4/5   Right Knee Extension 4/5   Left Knee Flexion 4/5   Left Knee Extension 4/5   Right/Left Ankle Right;Left   Left Ankle Dorsiflexion 4/5  with pain   Left Ankle Inversion 4/5   Left Ankle Eversion 3+/5  with pain     Palpation   Palpation comment tenderness at Lt ATF ligament     Ambulation/Gait   Ambulation/Gait Yes   Ambulation/Gait Assistance 5: Supervision   Assistive device None   Gait Pattern Decreased step length - left;Decreased step length - right;Shuffle     Standardized Balance Assessment   Standardized Balance Assessment Berg Balance Test     Berg Balance Test   Sit to Stand Able to stand without using hands and stabilize independently   Standing Unsupported Able to stand safely 2 minutes   Sitting with Back Unsupported but Feet Supported on Floor or Stool Able to sit safely and securely 2 minutes   Stand to Sit Sits safely with minimal use of hands   Transfers Able to transfer safely, minor use of hands   Standing Unsupported with Eyes Closed Able to stand 10 seconds with supervision   Standing Ubsupported with Feet Together Able to place feet together independently and stand 1 minute safely   From Standing, Reach Forward with Outstretched Arm Can reach forward >12 cm safely (5")   From Standing Position, Pick up  Object from Floor Able to pick up shoe, needs supervision   From Standing Position, Turn to Look Behind Over each Shoulder Needs supervision when turning   Turn 360 Degrees Able to turn 360 degrees safely but slowly   Standing Unsupported, Alternately Place Feet on Step/Stool Needs assistance to keep from falling or unable to try   Standing Unsupported, One Foot in Front Able to plae foot ahead of the other independently and hold 30 seconds   Standing on One Leg Tries to lift leg/unable to hold 3 seconds but remains standing independently   Total Score 40                   [facility] Adult PT Treatment/Exercise - 09/15/16 1025      Exercises   Exercises Ankle     Ankle Exercises: Seated   Ankle Circles/Pumps --  instructed in HEP- pumps, circles and inv/ev                PT Education - 09/15/16 1058    Education provided Yes   Education Details clinical findings, HEP, POC   Person(s) Educated Patient   Methods Explanation;Demonstration;Handout   Comprehension Verbalized understanding          PT Short Term Goals - 09/15/16 1249      PT SHORT TERM GOAL #1   Title verbalize understanding of fall prevention strategies to decrease risk of reinjury (10/13/16)   Time 4   Period Weeks   Status New     PT SHORT TERM GOAL #2   Title improve BERG balance score to >/= 46/56 for decreased fall risk (10/13/16)   Time 4   Period Weeks   Status New  PT SHORT TERM GOAL #3   Title improve Lt ankle swelling to </= 0.5 cm of Rt ankle figure-8 measurements for improved motion and mobility (10/13/16)   Time 4   Period Weeks   Status New     PT SHORT TERM GOAL #4   Title report no pain with ADLs or grocery shopping for improved function (10/13/16)   Time 4   Period Weeks   Status New           PT Long Term Goals - 09/15/16 1252      PT LONG TERM GOAL #1   Title independent with HEP (11/10/16)   Time 8   Period Weeks   Status New     PT LONG TERM GOAL #2    Title improve BERG balance score to >/= 48/56 for improved balance (11/10/16)   Time 8   Period Weeks   Status New     PT LONG TERM GOAL #3   Title improve LLE strength to at least 4/5 for improved function and in order to return to square dancing (11/11/16)   Time 8   Period Weeks   Status New     PT LONG TERM GOAL #4   Title additional balance testing and LTGs to follow     PT LONG TERM GOAL #5   Title n/a               Plan - 09/15/16 1245    Clinical Impression Statement Pt is a 76 y/o female who presents to OPPT for moderate complexity PT eval for Lt ankle sprain following a fall on 09/12/16.  Pt demonstrates pain, weakness, edema and decreased motion of Lt ankle affecting functional mobility.  Pt also identified as high fall risk today with BERG score of 40/56 and gait abnormalities affecting safe functional mobility.  Will plan to see 2x/wk x 8 weeks to address Lt ankle sprain as well as balance deficits to decrease risk of reinjury.   Rehab Potential Good   Clinical Impairments Affecting Rehab Potential multiple comorbidities   PT Frequency 2x / week   PT Duration 8 weeks   PT Treatment/Interventions ADLs/Self Care Home Management;Cryotherapy;Electrical Stimulation;Moist Heat;Ultrasound;Neuromuscular re-education;Balance training;Therapeutic exercise;Therapeutic activities;Functional mobility training;Stair training;Gait training;Patient/family education;Manual techniques;Passive range of motion;Vasopneumatic Device;Taping;Dry needling   PT Next Visit Plan review HEP, add theraband exercises/gastroc stretch, balance/strengthening/gait, modalities PRN for pain, perform 5x STS and TUG   Consulted and Agree with Plan of Care Patient      Patient will benefit from skilled therapeutic intervention in order to improve the following deficits and impairments:  Abnormal gait, Decreased balance, Decreased mobility, Difficulty walking, Decreased range of motion, Impaired flexibility,  Pain, Increased edema, Decreased strength, Decreased knowledge of use of DME  Visit Diagnosis: Stiffness of left ankle, not elsewhere classified - Plan: PT plan of care cert/re-cert  Pain in left ankle and joints of left foot - Plan: PT plan of care cert/re-cert  Other abnormalities of gait and mobility - Plan: PT plan of care cert/re-cert  Unsteadiness on feet - Plan: PT plan of care cert/re-cert  Muscle weakness (generalized) - Plan: PT plan of care cert/re-cert     Problem List Patient Active Problem List   Diagnosis Date Noted  . Pigmented villonodular synovitis of knee, left 03/22/2016  . Abnormal radionuclide bone scan 08/16/2015  . Ankle fracture, bimalleolar, closed 08/16/2015  . Central chest pain 07/28/2015  . HLD (hyperlipidemia) 07/28/2015  . Fatty liver 09/09/2014  . Lumbago 07/07/2014  .  Abnormality of gait 03/27/2014  . Memory loss 01/08/2014  . 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 Other abnormal glucose 11/29/2013  . Carcinoma of head of pancreas ([place]) 07/01/2013  . 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 Paresthesia of foot 06/06/2012  . Syncope 09/26/2011  . RHINOSINUSITIS, CHRONIC 02/03/2009  . COLONIC POLYPS 06/05/2008  . OSTEOARTHRITIS 06/04/2008  . DIARRHEA, CHRONIC 04/22/2008  . Low back pain 08/03/2007  . Chronic maxillary sinusitis 04/20/2007  . Hypothyroidism 02/26/2007  . HYPERLIPIDEMIA 02/26/2007  . ANXIETY 02/26/2007  . Adjustment disorder with mixed anxiety and depressed mood 02/26/2007  . TARDIVE DYSKINESIA 02/26/2007  . GLAUCOMA 02/26/2007  . EUSTACHIAN TUBE DYSFUNCTION 02/26/2007  . Essential hypertension 02/26/2007  . MITRAL VALVE PROLAPSE 02/26/2007  . ALLERGIC RHINITIS 02/26/2007  . GERD 02/26/2007  . HIATAL HERNIA 02/26/2007  . OSTEOPOROSIS 02/26/2007      Laureen Abrahams, PT, DPT 09/15/16 1:27 PM    [facility] [place], [place], [address] Phone: [phone]   Fax:  [phone]  Name: [name] MRN: [id number] Date of Birth: 07/18/1940

## 2016-09-15 NOTE — Patient Instructions (Signed)
Bring a copy of your advance directives to your next office visit. Schedule bone density. Follow up with PCP as directed. Community Occupational psychologist of Services Cost  A Matter of Balance Class locations vary. Call [place] on Aging for more information.  [email or web address] [phone] 8-Session program addressing the fear of falling and increasing activity levels of older adults Free to minimal cost  A.C.T. By [employer] [address] [place], [place] [address].  [email or web address] [phone]  Personal training, gym, classes including Silver Sneakers* and ACTion for Aging Adults Fee-based  A.H.O.Y. (Add Health to Newman) Airs on Time [employer] 13, M-F at [place]: [employer],  [place] [facility] [place],  [place], [place] [facility], [address] [facility], [place], [place], [place] [address]  [place] Location: Sharrell Ku. [employer] [place]      [phone]  [phone]  [phone]  [phone]  [phone]  [phone]  [phone]  [phone]  [phone]  [phone]    [phone] A total-body conditioning class for adults 95 and older; designed to increase muscular strength, endurance, range of movement, flexibility, balance, agility and coordination Free  [facility] [place], [place] [address] [place]      [address]      [phone]      Pilate's class for individualsreturning to exercise after an injury, before or after surgery or for individuals  with complex musculoskeletal issues; designed to improve strength, balance , flexibility      $15/class  [place] [address] [place], [place] [address] [email or web address] Emery classes for beginners to advanced [place], [place] [address] [email or web address] [place] Chair Exercises Free, ages 7 and older; Ages 1-59 fee based  Marvia Pickles, [facility] [address] [place], [place] [address] [email or web address] .Beverlee Nims [phone]  A.H.O.Y. Tai Chi Fee-based Donation based or free  [place] Class locations vary.  Call or email Angela Burke or view website for more information. [email or web address] [phone] Ongoing classes at local YMC[employer] and gyms Fee-based  Silver Sneakers A.C.T. By [place] [employer]: [place] [phone]  [phone] Classes designed for older adults who want to improve their strength, flexibility, balance and endurance.   Silver sneakers is covered by some insurance plans and includes a fitness center membership at participating locations. Find out more by calling [phone] or visiting [email or web address] Covered by some insurance plans  [facility] [place] [phone] A.H.O.Y., fitness room, personal training, fitness classes for injury prevention, strength, balance,  flexibility, water fitness classes Ages 55+: $62 for 6 months; Ages 71-54: $39 for 6 months  Tai Chi for Everybody [facility] [address] [place], [place] [address] [email or web address] .[email or web address] Sears [phone] Tai Chi classes for beginners to advanced; geared for seniors Donation Based      [employer] (Helpling Others Participate in Exercise     Loyal Gambler. Rosana Hoes, PhD, [place] [email or web address] [place]     [phone]     A comprehensive fitness program for adults.  The program paris senior-level undergraduates Kinesiology students with adults who desire to learn how to exercise safely.  Includes a structural exercise class focusing on functional fitnesss     $100/semester in fall and spring; $75 in summer (no trainers)    *Silver Sneakers is covered by some Personal assistant and includes a  Radio producer at participating locations.  Find out more by calling [phone] or visiting [email or web address]  For additional health and human services resources for senior adults, please contact SeniorLine at [phone] in [place] and [place] at [phone] in all other areas. Fall Prevention in the Home Falls can cause injuries. They can happen to people of all ages. There are many things you can do to make your home safe and to help prevent falls. What can I do on the outside of my home?  Regularly fix the edges of walkways and driveways and fix any cracks.  Remove anything that might make you trip as you walk through a door, such as a raised step or threshold.  Trim any bushes or trees on the path to your home.  Use bright outdoor lighting.  Clear any walking paths of anything that might make someone trip, such as rocks or tools.  Regularly check to see if handrails are loose or broken. Make sure that both sides of any steps have handrails.  Any raised decks and porches should have guardrails on the edges.  Have any leaves, snow, or ice  cleared regularly.  Use sand or salt on walking paths during winter.  Clean up any spills in your garage right away. This includes oil or grease spills. What can I do in the bathroom?  Use night lights.  Install grab bars by the toilet and in the tub and shower. Do not use towel bars as grab bars.  Use non-skid mats or decals in the tub or shower.  If you need to sit down in the shower, use a plastic, non-slip stool.  Keep the floor dry. Clean up any water that spills on the floor as soon as it happens.  Remove soap buildup in the tub or shower regularly.  Attach bath mats securely with double-sided non-slip rug tape.  Do not have throw rugs and other things on the floor that can make you trip. What can I do in the bedroom?  Use night lights.  Make sure that you have a light by your bed that is easy to reach.  Do not use any sheets or blankets that are too big for your bed. They should not hang down onto the floor.  Have a firm chair that has side arms. You can use this for support while you get dressed.  Do not have throw rugs and other things on the floor that can make you trip. What can I do in the kitchen?  Clean up any spills right away.  Avoid walking on wet floors.  Keep items that you use a lot in easy-to-reach places.  If you need to reach something above you, use a strong step stool that has a grab bar.  Keep electrical cords out of the way.  Do not use floor polish or wax that makes floors slippery. If you must use wax, use non-skid floor wax.  Do not have throw rugs and other things on the floor that can make you trip. What can I do with my stairs?  Do not leave any items on the stairs.  Make sure that there are handrails on both sides of the stairs and use them. Fix handrails that are broken or loose. Make sure that handrails are as long as the stairways.  Check any carpeting to make sure that it is firmly attached to the stairs. Fix any carpet that  is loose or worn.  Avoid having throw rugs at the top or bottom of the stairs. If you do have throw rugs, attach them to the floor with carpet tape.  Make sure that you have a light switch at the top of the stairs and the bottom of the stairs. If you do not have them, ask someone to add them for you. What else can I do to help prevent falls?  Wear shoes that:  Do not have high heels.  Have rubber bottoms.  Are comfortable and fit you well.  Are closed at the toe. Do not wear sandals.  If you use a stepladder:  Make sure that it is fully opened. Do not climb a closed stepladder.  Make sure that both sides of the stepladder are locked into place.  Ask someone to hold it for you, if possible.  Clearly mark and make sure that you can see:  Any grab bars or handrails.  First and last steps.  Where the edge of each step is.  Use tools that help you move around (mobility aids) if they are needed. These include:  Canes.  Walkers.  Scooters.  Crutches.  Turn on the lights when you go into a dark area. Replace any light bulbs as soon as they burn out.  Set up your furniture so you have a clear path. Avoid moving your furniture around.  If any of your floors are uneven, fix them.  If there are any pets around you, be aware of where they are.  Review your medicines with your doctor. Some medicines can make you feel dizzy. This can increase your chance of falling. Ask your doctor what other things that you can do to help prevent falls. This information is not intended to replace advice given to you by your health care provider. Make sure you discuss any questions you have with your health care provider. Document Released: 03/12/2009 Document Revised: 10/22/2015 Document Reviewed: 06/20/2014 Elsevier Interactive Patient Education  2017 [place] Heart-healthy meal planning includes:  Limiting unhealthy fats.  Increasing healthy  fats.  Making other small dietary changes. You may need to talk with your doctor or a diet specialist (dietitian) to create an eating plan that is right for you. What types of fat should I choose?  Choose healthy fats. These include olive oil and canola oil, flaxseeds, walnuts, almonds, and seeds.  Eat more omega-3 fats. These include salmon, mackerel, sardines, tuna, flaxseed oil, and ground flaxseeds. Try to eat fish at least twice each week.  Limit saturated fats.  Saturated fats are often found in animal products, such as meats, butter, and cream.  Plant sources of saturated fats include palm oil, palm kernel oil, and coconut oil.  Avoid foods with partially hydrogenated oils in them. These include stick margarine, some tub margarines, cookies, crackers, and other baked goods.  These contain trans fats. What general guidelines do I need to follow?  Check food labels carefully. Identify foods with trans fats or high amounts of saturated fat.  Fill one half of your plate with vegetables and green salads. Eat 4-5 servings of vegetables per day. A serving of vegetables is:  1 cup of raw leafy vegetables.   cup of raw or cooked cut-up vegetables.   cup of vegetable juice.  Fill one fourth of your plate with whole grains. Look for the word "whole" as the first word in the ingredient list.  Fill one fourth of your plate with lean protein foods.  Eat 4-5 servings of fruit per day. A serving of fruit is:  One medium whole fruit.   cup of dried fruit.   cup of fresh, frozen, or canned fruit.   cup of 100% fruit juice.  Eat more foods that contain soluble fiber. These include apples, broccoli, carrots, beans, peas, and barley. Try to get 20-30 g of fiber per day.  Eat more home-cooked food. Eat less restaurant, buffet, and fast food.  Limit or avoid alcohol.  Limit foods high in starch and sugar.  Avoid fried foods.  Avoid frying your food. Try baking, boiling,  grilling, or broiling it instead. You can also reduce fat by:  Removing the skin from poultry.  Removing all visible fats from meats.  Skimming the fat off of stews, soups, and gravies before serving them.  Steaming vegetables in water or broth.  Lose weight if you are overweight.  Eat 4-5 servings of nuts, legumes, and seeds per week:  One serving of dried beans or legumes equals  cup after being cooked.  One serving of nuts equals 1 ounces.  One serving of seeds equals  ounce or one tablespoon.  You may need to keep track of how much salt or sodium you eat. This is especially true if you have high blood pressure. Talk with your doctor or dietitian to get more information. What foods can I eat? Grains  Breads, including [place], white, pita, wheat, raisin, rye, oatmeal, and [place]. Tortillas that are neither fried nor made with lard or trans fat. Low-fat rolls, including hotdog and hamburger buns and English muffins. Biscuits. Muffins. Waffles. Pancakes. Light popcorn. Whole-grain cereals. Flatbread. Melba toast. Pretzels. Breadsticks. Rusks. Low-fat snacks. Low-fat crackers, including oyster, saltine, matzo, graham, animal, and rye. Rice and pasta, including brown rice and pastas that are made with whole wheat. Vegetables  All vegetables. Fruits  All fruits, but limit coconut. Meats and Other Protein Sources  Lean, well-trimmed beef, veal, pork, and lamb. Chicken and [place] without skin. All fish and shellfish. Wild duck, rabbit, pheasant, and venison. Egg whites or low-cholesterol egg substitutes. Dried beans, peas, lentils, and tofu. Seeds and most nuts. Dairy  Low-fat or nonfat cheeses, including ricotta, string, and mozzarella. Skim or 1% milk that is liquid, powdered, or evaporated. Buttermilk that is made with low-fat milk. Nonfat or low-fat yogurt. Beverages  Mineral water. Diet carbonated beverages. Sweets and Desserts  Sherbets and fruit ices. Honey, jam, marmalade,  jelly, and syrups. Meringues and gelatins. Pure sugar candy, such as hard candy, jelly beans, gumdrops, mints, marshmallows, and small amounts of dark chocolate. [employer] Eat all sweets and desserts in moderation. Fats and Oils  Nonhydrogenated (trans-free) margarines. Vegetable oils, including soybean, sesame, sunflower, olive, peanut, safflower, corn, canola, and cottonseed. Salad dressings or mayonnaise made with a vegetable oil. Limit added fats and oils that you use for  cooking, baking, salads, and as spreads. Other  Cocoa powder. Coffee and tea. All seasonings and condiments. The items listed above may not be a complete list of recommended foods or beverages. Contact your dietitian for more options.  What foods are not recommended? Grains  Breads that are made with saturated or trans fats, oils, or whole milk. Croissants. Butter rolls. Cheese breads. Sweet rolls. Donuts. Buttered popcorn. Chow mein noodles. High-fat crackers, such as cheese or butter crackers. Meats and Other Protein Sources  Fatty meats, such as hotdogs, short ribs, sausage, spareribs, bacon, rib eye roast or steak, and mutton. High-fat deli meats, such as salami and bologna. Caviar. Domestic duck and goose. Organ meats, such as kidney, liver, sweetbreads, and heart. Dairy  Cream, sour cream, cream cheese, and creamed cottage cheese. Whole-milk cheeses, including blue (bleu), Monterey Jack, [place], [place], American, [place], Swiss, cheddar, [place], and [place]. Whole or 2% milk that is liquid, evaporated, or condensed. Whole buttermilk. Cream sauce or high-fat cheese sauce. Yogurt that is made from whole milk. Beverages  Regular sodas and juice drinks with added sugar. Sweets and Desserts  Frosting. Pudding. Cookies. Cakes other than angel food cake. Candy that has milk chocolate or white chocolate, hydrogenated fat, butter, coconut, or unknown ingredients. Buttered syrups. Full-fat ice cream or ice cream  drinks. Fats and Oils  Gravy that has suet, meat fat, or shortening. Cocoa butter, hydrogenated oils, palm oil, coconut oil, palm kernel oil. These can often be found in baked products, candy, fried foods, nondairy creamers, and whipped toppings. Solid fats and shortenings, including bacon fat, salt pork, lard, and butter. Nondairy cream substitutes, such as coffee creamers and sour cream substitutes. Salad dressings that are made of unknown oils, cheese, or sour cream. The items listed above may not be a complete list of foods and beverages to avoid. Contact your dietitian for more information.  This information is not intended to replace advice given to you by your health care provider. Make sure you discuss any questions you have with your health care provider. Document Released: 11/15/2011 Document Revised: 10/22/2015 Document Reviewed: 11/07/2013 Elsevier Interactive Patient Education  2017 [employer].

## 2016-09-15 NOTE — Patient Instructions (Signed)
ROM: Inversion / Eversion   With left leg relaxed, gently turn ankle and foot in and out. Move through full range of motion. Avoid pain. Repeat _10___ times per set. Do _2___ sets per session. Do __2__ sessions per day.  http://orth.exer.us/36   Copyright  VHI. All rights reserved.  ROM: Plantar / Dorsiflexion   With left leg relaxed, gently flex and extend ankle. Move through full range of motion. Avoid pain. Repeat __10__ times per set. Do _2___ sets per session. Do __2__ sessions per day.  http://orth.exer.us/34   Copyright  VHI. All rights reserved.    Ankle Circles   Slowly rotate right foot and ankle clockwise then counterclockwise. Gradually increase range of motion. Avoid pain. Circle __10__ times each direction per set. Do _2___ sets per session. Do _2___ sessions per day.  http://orth.exer.us/30   Copyright  VHI. All rights reserved.

## 2016-09-16 ENCOUNTER — Other Ambulatory Visit: Payer: Self-pay | Admitting: Family Medicine

## 2016-09-16 DIAGNOSIS — Z1231 Encounter for screening mammogram for malignant neoplasm of breast: Secondary | ICD-10-CM

## 2016-09-19 ENCOUNTER — Ambulatory Visit (INDEPENDENT_AMBULATORY_CARE_PROVIDER_SITE_OTHER): Payer: Medicare Other | Admitting: Physical Therapy

## 2016-09-19 DIAGNOSIS — R2689 Other abnormalities of gait and mobility: Secondary | ICD-10-CM | POA: Diagnosis not present

## 2016-09-19 DIAGNOSIS — R2681 Unsteadiness on feet: Secondary | ICD-10-CM | POA: Diagnosis not present

## 2016-09-19 DIAGNOSIS — M6281 Muscle weakness (generalized): Secondary | ICD-10-CM | POA: Diagnosis not present

## 2016-09-19 DIAGNOSIS — M25672 Stiffness of left ankle, not elsewhere classified: Secondary | ICD-10-CM

## 2016-09-19 DIAGNOSIS — M25572 Pain in left ankle and joints of left foot: Secondary | ICD-10-CM

## 2016-09-19 NOTE — Therapy (Signed)
[facility] [address] [place], [place], [address] Phone: [phone]   Fax:  [phone]  Physical Therapy Treatment  Patient Details  Name: [name] MRN: [id number] Date of Birth: [birth year] Referring Provider: Dr. Briscoe Deutscher  Encounter Date: 09/19/2016      PT End of Session - 09/19/16 1454    Visit Number 2   Number of Visits 16   Date for PT Re-Evaluation 11/10/16   PT Start Time 2595   PT Stop Time 1449   PT Time Calculation (min) 47 min   Activity Tolerance Patient tolerated treatment well   Behavior During Therapy [facility] for tasks assessed/performed      Past Medical History:  Diagnosis Date  . Allergic rhinitis   . Cancer ([place]) 07/10/13   Pancreatic cancer with MRI scan 06-19-13  . Chronic maxillary sinusitis    neti pot  . Depression    alone a lot  . Eustachian tube dysfunction   . GERD (gastroesophageal reflux disease)   . Glaucoma   . Heart murmur    hx. "MVP" -predental antibiotics  . Hiatal hernia   . Hypertension   . Hypothyroid   . Memory loss    short term memory loss  . Mitral valve prolapse    antibiotics before dental procedures  . Neuropathy   . Osteoarthritis   . Stroke [facility])    mini storkes left leg paraylsis. patient denies weakness 01/08/14.   . Tardive dyskinesia    possibly reglan, vitamin E helps  . Urgency of urination    some UTI in past    Past Surgical History:  Procedure Laterality Date  . 1 baker cyst removed    . ABDOMINAL HYSTERECTOMY     including ovaries  . BACK SURGERY     fusion  . BLEPHAROPLASTY Bilateral    with cataract surgery  . BREAST SURGERY     Biopsy left 2 times  . COLONOSCOPY W/ POLYPECTOMY     2004 last colonoscopy, no polyps  . DILATION AND CURETTAGE OF UTERUS     x3  . ESOPHAGOGASTRODUODENOSCOPY N/A 09/11/2013   Procedure: ESOPHAGOGASTRODUODENOSCOPY (EGD);  Surgeon: Cleotis Nipper, MD;  Location: [facility] ENDOSCOPY;  Service: Endoscopy;   Laterality: N/A;  Moderate sedation okay if MAC not available  . EUS N/A 07/10/2013   Procedure: ESOPHAGEAL ENDOSCOPIC ULTRASOUND (EUS) RADIAL;  Surgeon: Arta Silence, MD;  Location: [facility] ENDOSCOPY;  Service: Endoscopy;  Laterality: N/A;  . EYE SURGERY Right    cataract  . FINE NEEDLE ASPIRATION N/A 07/10/2013   Procedure: FINE NEEDLE ASPIRATION (FNA) LINEAR;  Surgeon: Arta Silence, MD;  Location: [facility] ENDOSCOPY;  Service: Endoscopy;  Laterality: N/A;  possible fna  . JOINT REPLACEMENT     LTKA  . KNEE SURGERY Left    x 5, total knee Left knee  . LAPAROSCOPY N/A 08/07/2013   Procedure: LAPAROSCOPY DIAGNOSTIC;  Surgeon: [name], MD;  Location: [place];  Service: General;  Laterality: N/A;  . LUMBAR SPINE SURGERY     x2  . LUMBAR SPINE SURGERY     cyst  . ORIF ANKLE FRACTURE Right 08/16/2015   Procedure: OPEN REDUCTION INTERNAL FIXATION (ORIF) ANKLE FRACTURE;  Surgeon: Meredith Pel, MD;  Location: [place];  Service: Orthopedics;  Laterality: Right;  . RADIOACTIVE SEED GUIDED EXCISIONAL BREAST BIOPSY Left 12/15/2015   Procedure: LEFT RADIOACTIVE SEED GUIDED EXCISIONAL BREAST BIOPSY;  Surgeon: [name], MD;  Location: [place];  Service: General;  Laterality: Left;  . WHIPPLE PROCEDURE N/A 08/07/2013   Procedure: WHIPPLE PROCEDURE;  Surgeon: [name], MD;  Location: [place];  Service: General;  Laterality: N/A;    There were no vitals filed for this visit.      Subjective Assessment - 09/19/16 1404    Subjective ankle has been hurting her all night, along with her neuropathy.     Pertinent History depression, glaucoma, HTN, neuropathy, hx CVA, arthritis, tardive dyskinesia   Patient Stated Goals return to square dancing   Currently in Pain? Yes   Pain Score 6    Pain Location Ankle   Pain Orientation Left;Lateral   Pain Descriptors / Indicators Burning  stinging, pinching   Pain Type Acute pain   Pain Onset 1 to 4 weeks ago   Pain Frequency Intermittent   Aggravating Factors   standing, walking   Pain Relieving Factors rest, elevation, medication            [facility] PT Assessment - 09/19/16 1444      Standardized Balance Assessment   Standardized Balance Assessment Timed Up and Go Test;Five Times Sit to Stand   Five times sit to stand comments  15.84 sec without UE support     Timed Up and Go Test   Normal TUG (seconds) 13.09                     [facility] Adult PT Treatment/Exercise - 09/19/16 1409      Ankle Exercises: Seated   ABC's 1 rep  Rt   Ankle Circles/Pumps 10 reps  instructed in HEP- pumps, circles and inv/ev   Other Seated Ankle Exercises ankle 4-way with green theraband x 20 each; Rt ankle     Ankle Exercises: Aerobic   Stationary Bike L2 x 6 min                  PT Short Term Goals - 09/19/16 1454      PT SHORT TERM GOAL #1   Title verbalize understanding of fall prevention strategies to decrease risk of reinjury (10/13/16)   Status On-going     PT SHORT TERM GOAL #2   Title improve BERG balance score to >/= 46/56 for decreased fall risk (10/13/16)   Status On-going     PT SHORT TERM GOAL #3   Title improve Lt ankle swelling to </= 0.5 cm of Rt ankle figure-8 measurements for improved motion and mobility (10/13/16)   Status On-going     PT SHORT TERM GOAL #4   Title report no pain with ADLs or grocery shopping for improved function (10/13/16)   Status On-going           PT Long Term Goals - 09/19/16 1454      PT LONG TERM GOAL #1   Title independent with HEP (11/10/16)   Status On-going     PT LONG TERM GOAL #2   Title improve BERG balance score to >/= 48/56 for improved balance (11/10/16)   Status On-going     PT LONG TERM GOAL #3   Title improve LLE strength to at least 4/5 for improved function and in order to return to square dancing (11/11/16)   Status On-going     PT LONG TERM GOAL #4   Title improve timed up and go to < 12 sec for improved mobility and decreased fall risk (11/11/16)    Status On-going     PT LONG TERM GOAL #5  Title perform 5x STS in < 13 sec for improved functional strength (11/11/16)   Status On-going               Plan - 09/19/16 1454    Clinical Impression Statement Pt presents today with c/o increased pain from ankle and neuropathy this weekend.  Pt unable to recall cause of increase in pain.  Recommended pt use cane due to fall risk, but pt without cane today.  Tolerated increased strengthening exercises today and updated HEP.  Will continue to benefit from PT to maximize function.   PT Treatment/Interventions ADLs/Self Care Home Management;Cryotherapy;Electrical Stimulation;Moist Heat;Ultrasound;Neuromuscular re-education;Balance training;Therapeutic exercise;Therapeutic activities;Functional mobility training;Stair training;Gait training;Patient/family education;Manual techniques;Passive range of motion;Vasopneumatic Device;Taping;Dry needling   PT Next Visit Plan review HEP, add gastroc stretch, balance/strengthening/gait, modalities PRN for pain   Consulted and Agree with Plan of Care Patient      Patient will benefit from skilled therapeutic intervention in order to improve the following deficits and impairments:  Abnormal gait, Decreased balance, Decreased mobility, Difficulty walking, Decreased range of motion, Impaired flexibility, Pain, Increased edema, Decreased strength, Decreased knowledge of use of DME  Visit Diagnosis: Pain in left ankle and joints of left foot  Stiffness of left ankle, not elsewhere classified  Other abnormalities of gait and mobility  Unsteadiness on feet  Muscle weakness (generalized)     Problem List Patient Active Problem List   Diagnosis Date Noted  . Pigmented villonodular synovitis of knee, left 03/22/2016  . Abnormal radionuclide bone scan 08/16/2015  . Ankle fracture, bimalleolar, closed 08/16/2015  . Central chest pain 07/28/2015  . HLD (hyperlipidemia) 07/28/2015  . Fatty liver  09/09/2014  . Lumbago 07/07/2014  . Abnormality of gait 03/27/2014  . Memory loss 01/08/2014  . 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 Other abnormal glucose 11/29/2013  . Carcinoma of head of pancreas ([place]) 07/01/2013  . 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 Paresthesia of foot 06/06/2012  . Syncope 09/26/2011  . RHINOSINUSITIS, CHRONIC 02/03/2009  . COLONIC POLYPS 06/05/2008  . OSTEOARTHRITIS 06/04/2008  . DIARRHEA, CHRONIC 04/22/2008  . Low back pain 08/03/2007  . Chronic maxillary sinusitis 04/20/2007  . Hypothyroidism 02/26/2007  . HYPERLIPIDEMIA 02/26/2007  . ANXIETY 02/26/2007  . Adjustment disorder with mixed anxiety and depressed mood 02/26/2007  . TARDIVE DYSKINESIA 02/26/2007  . GLAUCOMA 02/26/2007  . EUSTACHIAN TUBE DYSFUNCTION 02/26/2007  . Essential hypertension 02/26/2007  . MITRAL VALVE PROLAPSE 02/26/2007  . ALLERGIC RHINITIS 02/26/2007  . GERD 02/26/2007  . HIATAL HERNIA 02/26/2007  . OSTEOPOROSIS 02/26/2007      Laureen Abrahams, PT, DPT 09/19/16 3:03 PM    [facility] [place], [place], [address] Phone: [phone]   Fax:  [phone]  Name: [name] MRN: [id number] Date of Birth: 1941-01-02

## 2016-09-19 NOTE — Patient Instructions (Signed)
ANKLE: Dorsiflexion (Band)    Sit at edge of surface. Place band around top of foot. Keeping heel on floor, raise toes of banded foot. Hold __1-2_ seconds. Use __green______ band. _20__ reps per set, _1__ sets per day, _6-7__ days per week  Copyright  VHI. All rights reserved.    ANKLE: Eversion, Unilateral (Band)    Place band around left foot. Keeping heel in place, raise toes of banded foot up and away from body. Do not move hip. Hold _1-2__ seconds. Use ___green_____ band. _20__ reps per set, __1_ sets per day, _6-7__ days per week  Copyright  VHI. All rights reserved.    ANKLE: Inversion, Unilateral (Band)    Placing band around left foot. Keeping heel in place, lift toes of banded foot up and in. Do not move hip. Hold _1-2__ seconds. Use __green______ band. _20__ reps per set, _1__ sets per day, _6-7__ days per week  Copyright  VHI. All rights reserved.    ANKLE: Plantarflexion - Sitting (Band)    Place band around foot; hold other end. Sit at edge of sitting surface. Keep heel in place. Push foot down against band. Hold _1-2__ seconds. Use __green______ band. _20__ reps per set, _1__ sets per day, _6-7__ days per week  Copyright  VHI. All rights reserved.

## 2016-09-21 ENCOUNTER — Other Ambulatory Visit: Payer: Self-pay | Admitting: Family Medicine

## 2016-09-21 DIAGNOSIS — E2839 Other primary ovarian failure: Secondary | ICD-10-CM

## 2016-09-22 ENCOUNTER — Ambulatory Visit (INDEPENDENT_AMBULATORY_CARE_PROVIDER_SITE_OTHER): Payer: Medicare Other | Admitting: Physical Therapy

## 2016-09-22 DIAGNOSIS — M25572 Pain in left ankle and joints of left foot: Secondary | ICD-10-CM

## 2016-09-22 DIAGNOSIS — R2689 Other abnormalities of gait and mobility: Secondary | ICD-10-CM

## 2016-09-22 DIAGNOSIS — M6281 Muscle weakness (generalized): Secondary | ICD-10-CM

## 2016-09-22 DIAGNOSIS — R2681 Unsteadiness on feet: Secondary | ICD-10-CM | POA: Diagnosis not present

## 2016-09-22 DIAGNOSIS — M25672 Stiffness of left ankle, not elsewhere classified: Secondary | ICD-10-CM | POA: Diagnosis not present

## 2016-09-22 NOTE — Therapy (Signed)
[facility] [address] [place], [place], [address] Phone: [phone]   Fax:  [phone]  Physical Therapy Treatment  Patient Details  Name: [name] MRN: [id number] Date of Birth: 01/02/1941 Referring Provider: Dr. Briscoe Deutscher  Encounter Date: 09/22/2016      PT End of Session - 09/22/16 1057    Visit Number 3   Number of Visits 16   Date for PT Re-Evaluation 11/10/16   PT Start Time 7902   PT Stop Time 1055   PT Time Calculation (min) 40 min   Activity Tolerance Patient tolerated treatment well   Behavior During Therapy [facility] for tasks assessed/performed      Past Medical History:  Diagnosis Date  . Allergic rhinitis   . Cancer ([place]) 07/10/13   Pancreatic cancer with MRI scan 06-19-13  . Chronic maxillary sinusitis    neti pot  . Depression    alone a lot  . Eustachian tube dysfunction   . GERD (gastroesophageal reflux disease)   . Glaucoma   . Heart murmur    hx. "MVP" -predental antibiotics  . Hiatal hernia   . Hypertension   . Hypothyroid   . Memory loss    short term memory loss  . Mitral valve prolapse    antibiotics before dental procedures  . Neuropathy   . Osteoarthritis   . Stroke [facility])    mini storkes left leg paraylsis. patient denies weakness 01/08/14.   . Tardive dyskinesia    possibly reglan, vitamin E helps  . Urgency of urination    some UTI in past    Past Surgical History:  Procedure Laterality Date  . 1 baker cyst removed    . ABDOMINAL HYSTERECTOMY     including ovaries  . BACK SURGERY     fusion  . BLEPHAROPLASTY Bilateral    with cataract surgery  . BREAST SURGERY     Biopsy left 2 times  . COLONOSCOPY W/ POLYPECTOMY     2004 last colonoscopy, no polyps  . DILATION AND CURETTAGE OF UTERUS     x3  . ESOPHAGOGASTRODUODENOSCOPY N/A 09/11/2013   Procedure: ESOPHAGOGASTRODUODENOSCOPY (EGD);  Surgeon: Cleotis Nipper, MD;  Location: [facility] ENDOSCOPY;  Service: Endoscopy;   Laterality: N/A;  Moderate sedation okay if MAC not available  . EUS N/A 07/10/2013   Procedure: ESOPHAGEAL ENDOSCOPIC ULTRASOUND (EUS) RADIAL;  Surgeon: Arta Silence, MD;  Location: [facility] ENDOSCOPY;  Service: Endoscopy;  Laterality: N/A;  . EYE SURGERY Right    cataract  . FINE NEEDLE ASPIRATION N/A 07/10/2013   Procedure: FINE NEEDLE ASPIRATION (FNA) LINEAR;  Surgeon: Arta Silence, MD;  Location: [facility] ENDOSCOPY;  Service: Endoscopy;  Laterality: N/A;  possible fna  . JOINT REPLACEMENT     LTKA  . KNEE SURGERY Left    x 5, total knee Left knee  . LAPAROSCOPY N/A 08/07/2013   Procedure: LAPAROSCOPY DIAGNOSTIC;  Surgeon: [name], MD;  Location: [place];  Service: General;  Laterality: N/A;  . LUMBAR SPINE SURGERY     x2  . LUMBAR SPINE SURGERY     cyst  . ORIF ANKLE FRACTURE Right 08/16/2015   Procedure: OPEN REDUCTION INTERNAL FIXATION (ORIF) ANKLE FRACTURE;  Surgeon: Meredith Pel, MD;  Location: [place];  Service: Orthopedics;  Laterality: Right;  . RADIOACTIVE SEED GUIDED EXCISIONAL BREAST BIOPSY Left 12/15/2015   Procedure: LEFT RADIOACTIVE SEED GUIDED EXCISIONAL BREAST BIOPSY;  Surgeon: [name], MD;  Location: [place];  Service: General;  Laterality: Left;  . WHIPPLE PROCEDURE N/A 08/07/2013   Procedure: WHIPPLE PROCEDURE;  Surgeon: [name], MD;  Location: [place];  Service: General;  Laterality: N/A;    There were no vitals filed for this visit.      Subjective Assessment - 09/22/16 1014    Subjective Lt foot is "killing me."  Reports leg is "on fire."   Pertinent History depression, glaucoma, HTN, neuropathy, hx CVA, arthritis, tardive dyskinesia   Patient Stated Goals return to square dancing   Currently in Pain? Yes   Pain Score 7    Pain Location Ankle   Pain Orientation Left;Lateral   Pain Descriptors / Indicators Burning  stinging   Pain Type Acute pain   Pain Onset 1 to 4 weeks ago   Pain Frequency Intermittent   Aggravating Factors  standing, walking    Pain Relieving Factors rest, elevation, medication                         [facility] Adult PT Treatment/Exercise - 09/22/16 1020      Manual Therapy   Manual Therapy Edema management   Edema Management Lt ankle/foot     Ankle Exercises: Seated   Towel Crunch --  x 20; unable to move towel   BAPS Sitting;Level 2;10 reps   Other Seated Ankle Exercises ankle 4-way with green theraband x 20 each; Lt ankle     Ankle Exercises: Stretches   Gastroc Stretch 3 reps;30 seconds  Lt; mod cues for technique; seated with strap                  PT Short Term Goals - 09/19/16 1454      PT SHORT TERM GOAL #1   Title verbalize understanding of fall prevention strategies to decrease risk of reinjury (10/13/16)   Status On-going     PT SHORT TERM GOAL #2   Title improve BERG balance score to >/= 46/56 for decreased fall risk (10/13/16)   Status On-going     PT SHORT TERM GOAL #3   Title improve Lt ankle swelling to </= 0.5 cm of Rt ankle figure-8 measurements for improved motion and mobility (10/13/16)   Status On-going     PT SHORT TERM GOAL #4   Title report no pain with ADLs or grocery shopping for improved function (10/13/16)   Status On-going           PT Long Term Goals - 09/19/16 1454      PT LONG TERM GOAL #1   Title independent with HEP (11/10/16)   Status On-going     PT LONG TERM GOAL #2   Title improve BERG balance score to >/= 48/56 for improved balance (11/10/16)   Status On-going     PT LONG TERM GOAL #3   Title improve LLE strength to at least 4/5 for improved function and in order to return to square dancing (11/11/16)   Status On-going     PT LONG TERM GOAL #4   Title improve timed up and go to < 12 sec for improved mobility and decreased fall risk (11/11/16)   Status On-going     PT LONG TERM GOAL #5   Title perform 5x STS in < 13 sec for improved functional strength (11/11/16)   Status On-going               Plan - 09/22/16 1058     Clinical Impression Statement Pt continues to  c/o pain in LLE which she attributes to neuropathy.  Pt very point tender at posterior lateral malleolus near peroneal tendon (? avulsion fx) and limited tolerance to manual therapy for edema managemen.  PT will continue to monitor and refer PRN.  Amb with cane today with improved stability.     PT Treatment/Interventions ADLs/Self Care Home Management;Cryotherapy;Electrical Stimulation;Moist Heat;Ultrasound;Neuromuscular re-education;Balance training;Therapeutic exercise;Therapeutic activities;Functional mobility training;Stair training;Gait training;Patient/family education;Manual techniques;Passive range of motion;Vasopneumatic Device;Taping;Dry needling   PT Next Visit Plan gastroc stretching, balance, strengthening, gait, edema management and modalities PRN   Consulted and Agree with Plan of Care Patient      Patient will benefit from skilled therapeutic intervention in order to improve the following deficits and impairments:  Abnormal gait, Decreased balance, Decreased mobility, Difficulty walking, Decreased range of motion, Impaired flexibility, Pain, Increased edema, Decreased strength, Decreased knowledge of use of DME  Visit Diagnosis: Pain in left ankle and joints of left foot  Stiffness of left ankle, not elsewhere classified  Other abnormalities of gait and mobility  Unsteadiness on feet  Muscle weakness (generalized)     Problem List Patient Active Problem List   Diagnosis Date Noted  . Pigmented villonodular synovitis of knee, left 03/22/2016  . Abnormal radionuclide bone scan 08/16/2015  . Ankle fracture, bimalleolar, closed 08/16/2015  . HLD (hyperlipidemia) 07/28/2015  . Fatty liver 09/09/2014  . Lumbago 07/07/2014  . Abnormality of gait 03/27/2014  . Memory loss 01/08/2014  . 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 Carcinoma of head of pancreas ([place]) 07/01/2013  . Hereditary and idiopathic peripheral  neuropathy 08/30/2012  . Paresthesia of foot 06/06/2012  . 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 Colon polyps 06/05/2008  . Osteoarthritis 06/04/2008  . Chronic diarrhea 04/22/2008  . Hypothyroidism 02/26/2007  . Adjustment disorder with mixed anxiety and depressed mood 02/26/2007  . Unspecified glaucoma 02/26/2007  . Essential hypertension 02/26/2007  . Mitral valve disease 02/26/2007  . Allergic rhinitis 02/26/2007  . GERD 02/26/2007  . Hiatal hernia 02/26/2007  . Osteoporosis 02/26/2007      Laureen Abrahams, PT, DPT 09/22/16 11:01 AM    [facility] [place], [place], [address] Phone: [phone]   Fax:  [phone]  Name: ELLIEMAE BRAMAN MRN: [id number] Date of Birth: May 25, 1941

## 2016-09-23 ENCOUNTER — Inpatient Hospital Stay: Admission: RE | Admit: 2016-09-23 | Payer: Medicare Other | Source: Ambulatory Visit

## 2016-09-23 ENCOUNTER — Other Ambulatory Visit: Payer: Medicare Other

## 2016-09-26 ENCOUNTER — Ambulatory Visit (INDEPENDENT_AMBULATORY_CARE_PROVIDER_SITE_OTHER): Payer: Medicare Other | Admitting: Physical Therapy

## 2016-09-26 DIAGNOSIS — M25672 Stiffness of left ankle, not elsewhere classified: Secondary | ICD-10-CM

## 2016-09-26 DIAGNOSIS — M25572 Pain in left ankle and joints of left foot: Secondary | ICD-10-CM

## 2016-09-26 DIAGNOSIS — M6281 Muscle weakness (generalized): Secondary | ICD-10-CM

## 2016-09-26 DIAGNOSIS — R2689 Other abnormalities of gait and mobility: Secondary | ICD-10-CM | POA: Diagnosis not present

## 2016-09-26 DIAGNOSIS — R2681 Unsteadiness on feet: Secondary | ICD-10-CM | POA: Diagnosis not present

## 2016-09-26 NOTE — Patient Instructions (Signed)
Gastroc / Heel Cord Stretch - Seated With Towel   Sit on couch or bed, towel around ball of foot. Gently pull foot in toward body, stretching heel cord and calf. Hold for _30__ seconds.  Repeat __2-3_ times. Do _2-3__ times per day.  Copyright  VHI. All rights reserved.

## 2016-09-26 NOTE — Therapy (Signed)
[facility] [address] [place], [place], [address] Phone: [phone]   Fax:  [phone]  Physical Therapy Treatment  Patient Details  Name: [name] MRN: [id number] Date of Birth: 1941/05/14 Referring Provider: Dr. Briscoe Deutscher  Encounter Date: 09/26/2016      PT End of Session - 09/26/16 1059    Visit Number 4   Number of Visits 16   Date for PT Re-Evaluation 11/10/16   PT Start Time 6948   PT Stop Time 1056   PT Time Calculation (min) 41 min   Activity Tolerance Patient tolerated treatment well   Behavior During Therapy [facility] for tasks assessed/performed      Past Medical History:  Diagnosis Date  . Allergic rhinitis   . Cancer ([place]) 07/10/13   Pancreatic cancer with MRI scan 06-19-13  . Chronic maxillary sinusitis    neti pot  . Depression    alone a lot  . Eustachian tube dysfunction   . GERD (gastroesophageal reflux disease)   . Glaucoma   . Heart murmur    hx. "MVP" -predental antibiotics  . Hiatal hernia   . Hypertension   . Hypothyroid   . Memory loss    short term memory loss  . Mitral valve prolapse    antibiotics before dental procedures  . Neuropathy   . Osteoarthritis   . Stroke [facility])    mini storkes left leg paraylsis. patient denies weakness 01/08/14.   . Tardive dyskinesia    possibly reglan, vitamin E helps  . Urgency of urination    some UTI in past    Past Surgical History:  Procedure Laterality Date  . 1 baker cyst removed    . ABDOMINAL HYSTERECTOMY     including ovaries  . BACK SURGERY     fusion  . BLEPHAROPLASTY Bilateral    with cataract surgery  . BREAST SURGERY     Biopsy left 2 times  . COLONOSCOPY W/ POLYPECTOMY     2004 last colonoscopy, no polyps  . DILATION AND CURETTAGE OF UTERUS     x3  . ESOPHAGOGASTRODUODENOSCOPY N/A 09/11/2013   Procedure: ESOPHAGOGASTRODUODENOSCOPY (EGD);  Surgeon: Cleotis Nipper, MD;  Location[facility] ENDOSCOPY;  Service: Endoscopy;   Laterality: N/A;  Moderate sedation okay if MAC not available  . EUS N/A 07/10/2013   Procedure: ESOPHAGEAL ENDOSCOPIC ULTRASOUND (EUS) RADIAL;  Surgeon: Arta Silence, MD;  Location: [facility] ENDOSCOPY;  Service: Endoscopy;  Laterality: N/A;  . EYE SURGERY Right    cataract  . FINE NEEDLE ASPIRATION N/A 07/10/2013   Procedure: FINE NEEDLE ASPIRATION (FNA) LINEAR;  Surgeon: Arta Silence, MD;  Location: [facility] ENDOSCOPY;  Service: Endoscopy;  Laterality: N/A;  possible fna  . JOINT REPLACEMENT     LTKA  . KNEE SURGERY Left    x 5, total knee Left knee  . LAPAROSCOPY N/A 08/07/2013   Procedure: LAPAROSCOPY DIAGNOSTIC;  Surgeon: [name], MD;  Location: [facility];  Service: General;  Laterality: N/A;  . LUMBAR SPINE SURGERY     x2  . LUMBAR SPINE SURGERY     cyst  . ORIF ANKLE FRACTURE Right 08/16/2015   Procedure: OPEN REDUCTION INTERNAL FIXATION (ORIF) ANKLE FRACTURE;  Surgeon: Meredith Pel, MD;  Location: [place];  Service: Orthopedics;  Laterality: Right;  . RADIOACTIVE SEED GUIDED EXCISIONAL BREAST BIOPSY Left 12/15/2015   Procedure: LEFT RADIOACTIVE SEED GUIDED EXCISIONAL BREAST BIOPSY;  Surgeon: [name], MD;  Location: [place];  Service: General;  Laterality: Left;  . WHIPPLE PROCEDURE N/A 08/07/2013   Procedure: WHIPPLE PROCEDURE;  Surgeon: [name], MD;  Location: [place];  Service: General;  Laterality: N/A;    There were no vitals filed for this visit.      Subjective Assessment - 09/26/16 1027    Subjective Still having a lot of neuropathy pain, and ankle is still hurting, described as a "sharp" pain.  Reports today that she hasn't square danced in 4-5 years.   Pertinent History depression, glaucoma, HTN, neuropathy, hx CVA, arthritis, tardive dyskinesia   How long can you walk comfortably? "couple hours"-walking around grocery store with cart   Patient Stated Goals return to square dancing   Currently in Pain? Yes   Pain Score 6   no pain at rest   Pain Location Ankle    Pain Orientation Left;Lateral   Pain Descriptors / Indicators Burning   Pain Type Acute pain   Pain Onset 1 to 4 weeks ago   Pain Frequency Intermittent   Aggravating Factors  exercises, walking is fine until she turns   Pain Relieving Factors rest, elevation, medication                         [facility] Adult PT Treatment/Exercise - 09/26/16 1035      Self-Care   Self-Care Other Self-Care Comments   Other Self-Care Comments  long discussion with pt about balance and causes of decreased balance; how PT can improve balance and PT's goals for this episode of care     Manual Therapy   Manual Therapy Edema management;Passive ROM   Edema Management Lt ankle/foot   Passive ROM Lt ankle dorsiflexion in supine     Ankle Exercises: Seated   Ankle Circles/Pumps 20 reps;Right  ankle pumps and circles each direction     Ankle Exercises: Stretches   Gastroc Stretch 3 reps;30 seconds  Lt; mod cues for technique; seated with strap                  PT Short Term Goals - 09/19/16 1454      PT SHORT TERM GOAL #1   Title verbalize understanding of fall prevention strategies to decrease risk of reinjury (10/13/16)   Status On-going     PT SHORT TERM GOAL #2   Title improve BERG balance score to >/= 46/56 for decreased fall risk (10/13/16)   Status On-going     PT SHORT TERM GOAL #3   Title improve Lt ankle swelling to </= 0.5 cm of Rt ankle figure-8 measurements for improved motion and mobility (10/13/16)   Status On-going     PT SHORT TERM GOAL #4   Title report no pain with ADLs or grocery shopping for improved function (10/13/16)   Status On-going           PT Long Term Goals - 09/19/16 1454      PT LONG TERM GOAL #1   Title independent with HEP (11/10/16)   Status On-going     PT LONG TERM GOAL #2   Title improve BERG balance score to >/= 48/56 for improved balance (11/10/16)   Status On-going     PT LONG TERM GOAL #3   Title improve LLE strength to at  least 4/5 for improved function and in order to return to square dancing (11/11/16)   Status On-going     PT LONG TERM GOAL #4   Title improve timed up and go to <  12 sec for improved mobility and decreased fall risk (11/11/16)   Status On-going     PT LONG TERM GOAL #5   Title perform 5x STS in < 13 sec for improved functional strength (11/11/16)   Status On-going               Plan - 09/26/16 1059    Clinical Impression Statement Pt continues to have pain but improve tenderness and decreased edema noted today.  Still continues to have balance difficulties and at this time recommending SPC at all times.  Will continue to benefit from PT to maximize function.   PT Treatment/Interventions ADLs/Self Care Home Management;Cryotherapy;Electrical Stimulation;Moist Heat;Ultrasound;Neuromuscular re-education;Balance training;Therapeutic exercise;Therapeutic activities;Functional mobility training;Stair training;Gait training;Patient/family education;Manual techniques;Passive range of motion;Vasopneumatic Device;Taping;Dry needling   PT Next Visit Plan gastroc stretching, balance, strengthening, gait, edema management and modalities PRN   Consulted and Agree with Plan of Care Patient      Patient will benefit from skilled therapeutic intervention in order to improve the following deficits and impairments:  Abnormal gait, Decreased balance, Decreased mobility, Difficulty walking, Decreased range of motion, Impaired flexibility, Pain, Increased edema, Decreased strength, Decreased knowledge of use of DME  Visit Diagnosis: Pain in left ankle and joints of left foot  Stiffness of left ankle, not elsewhere classified  Other abnormalities of gait and mobility  Unsteadiness on feet  Muscle weakness (generalized)     Problem List Patient Active Problem List   Diagnosis Date Noted  . Pigmented villonodular synovitis of knee, left 03/22/2016  . Abnormal radionuclide bone scan 08/16/2015  .  Ankle fracture, bimalleolar, closed 08/16/2015  . HLD (hyperlipidemia) 07/28/2015  . Fatty liver 09/09/2014  . Lumbago 07/07/2014  . Abnormality of gait 03/27/2014  . Memory loss 01/08/2014  . 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 Carcinoma of head of pancreas ([place]) 07/01/2013  . 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 Paresthesia of foot 06/06/2012  . 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 Colon polyps 06/05/2008  . Osteoarthritis 06/04/2008  . Chronic diarrhea 04/22/2008  . Hypothyroidism 02/26/2007  . Adjustment disorder with mixed anxiety and depressed mood 02/26/2007  . Unspecified glaucoma 02/26/2007  . Essential hypertension 02/26/2007  . Mitral valve disease 02/26/2007  . Allergic rhinitis 02/26/2007  . GERD 02/26/2007  . Hiatal hernia 02/26/2007  . Osteoporosis 02/26/2007      Laureen Abrahams, PT, DPT 09/26/16 11:02 AM    [facility] [place], [place], [address] Phone: [phone]   Fax:  [phone]  Name: [name] MRN: [id number] Date of Birth: [birth year]

## 2016-09-29 ENCOUNTER — Ambulatory Visit (INDEPENDENT_AMBULATORY_CARE_PROVIDER_SITE_OTHER): Payer: Medicare Other | Admitting: Physical Therapy

## 2016-09-29 DIAGNOSIS — M25672 Stiffness of left ankle, not elsewhere classified: Secondary | ICD-10-CM | POA: Diagnosis not present

## 2016-09-29 DIAGNOSIS — M25572 Pain in left ankle and joints of left foot: Secondary | ICD-10-CM | POA: Diagnosis not present

## 2016-09-29 DIAGNOSIS — R2689 Other abnormalities of gait and mobility: Secondary | ICD-10-CM

## 2016-09-29 DIAGNOSIS — M6281 Muscle weakness (generalized): Secondary | ICD-10-CM | POA: Diagnosis not present

## 2016-09-29 DIAGNOSIS — R2681 Unsteadiness on feet: Secondary | ICD-10-CM | POA: Diagnosis not present

## 2016-09-29 NOTE — Therapy (Signed)
[facility] [address] [place], [place], [address] Phone: [phone]   Fax:  [phone]  Physical Therapy Treatment  Patient Details  Name: [name] MRN: [id number] Date of Birth: 14-May-1941 Referring Provider: Dr. Briscoe Deutscher  Encounter Date: 09/29/2016      PT End of Session - 09/29/16 1059    Visit Number 5   Number of Visits 16   Date for PT Re-Evaluation 11/10/16   PT Start Time 6629   PT Stop Time 1056   PT Time Calculation (min) 41 min   Activity Tolerance Patient tolerated treatment well   Behavior During Therapy [facility] for tasks assessed/performed      Past Medical History:  Diagnosis Date  . Allergic rhinitis   . Cancer ([place]) 07/10/13   Pancreatic cancer with MRI scan 06-19-13  . Chronic maxillary sinusitis    neti pot  . Depression    alone a lot  . Eustachian tube dysfunction   . GERD (gastroesophageal reflux disease)   . Glaucoma   . Heart murmur    hx. "MVP" -predental antibiotics  . Hiatal hernia   . Hypertension   . Hypothyroid   . Memory loss    short term memory loss  . Mitral valve prolapse    antibiotics before dental procedures  . Neuropathy   . Osteoarthritis   . Stroke [facility])    mini storkes left leg paraylsis. patient denies weakness 01/08/14.   . Tardive dyskinesia    possibly reglan, vitamin E helps  . Urgency of urination    some UTI in past    Past Surgical History:  Procedure Laterality Date  . 1 baker cyst removed    . ABDOMINAL HYSTERECTOMY     including ovaries  . BACK SURGERY     fusion  . BLEPHAROPLASTY Bilateral    with cataract surgery  . BREAST SURGERY     Biopsy left 2 times  . COLONOSCOPY W/ POLYPECTOMY     2004 last colonoscopy, no polyps  . DILATION AND CURETTAGE OF UTERUS     x3  . ESOPHAGOGASTRODUODENOSCOPY N/A 09/11/2013   Procedure: ESOPHAGOGASTRODUODENOSCOPY (EGD);  Surgeon: Cleotis Nipper, MD;  Location: [facility] ENDOSCOPY;  Service: Endoscopy;   Laterality: N/A;  Moderate sedation okay if MAC not available  . EUS N/A 07/10/2013   Procedure: ESOPHAGEAL ENDOSCOPIC ULTRASOUND (EUS) RADIAL;  Surgeon: Arta Silence, MD;  Location[facility] ENDOSCOPY;  Service: Endoscopy;  Laterality: N/A;  . EYE SURGERY Right    cataract  . FINE NEEDLE ASPIRATION N/A 07/10/2013   Procedure: FINE NEEDLE ASPIRATION (FNA) LINEAR;  Surgeon: Arta Silence, MD;  Location: [facility] ENDOSCOPY;  Service: Endoscopy;  Laterality: N/A;  possible fna  . JOINT REPLACEMENT     LTKA  . KNEE SURGERY Left    x 5, total knee Left knee  . LAPAROSCOPY N/A 08/07/2013   Procedure: LAPAROSCOPY DIAGNOSTIC;  Surgeon: [name], MD;  Location: [place];  Service: General;  Laterality: N/A;  . LUMBAR SPINE SURGERY     x2  . LUMBAR SPINE SURGERY     cyst  . ORIF ANKLE FRACTURE Right 08/16/2015   Procedure: OPEN REDUCTION INTERNAL FIXATION (ORIF) ANKLE FRACTURE;  Surgeon: Meredith Pel, MD;  Location: [place];  Service: Orthopedics;  Laterality: Right;  . RADIOACTIVE SEED GUIDED EXCISIONAL BREAST BIOPSY Left 12/15/2015   Procedure: LEFT RADIOACTIVE SEED GUIDED EXCISIONAL BREAST BIOPSY;  Surgeon: [name], MD;  Location: [place];  Service: General;  Laterality: Left;  . WHIPPLE PROCEDURE N/A 08/07/2013   Procedure: WHIPPLE PROCEDURE;  Surgeon: [name], MD;  Location: [place];  Service: General;  Laterality: N/A;    There were no vitals filed for this visit.      Subjective Assessment - 09/29/16 1018    Subjective Feeling better today; ankle is still a little sore but legs are feeling better.    Pertinent History depression, glaucoma, HTN, neuropathy, hx CVA, arthritis, tardive dyskinesia   Patient Stated Goals return to square dancing   Currently in Pain? Yes   Pain Score 5    Pain Location Ankle   Pain Orientation Left;Lateral   Pain Descriptors / Indicators Sore   Pain Type Acute pain;Chronic pain   Pain Onset 1 to 4 weeks ago   Pain Frequency Intermittent   Aggravating  Factors  walking   Pain Relieving Factors rest, elevation, medication                         [facility] Adult PT Treatment/Exercise - 09/29/16 1020      Ambulation/Gait   Gait Comments amb 100' without ASO and with SPC with minguard A; cues for posture and to keep head up.       Ankle Exercises: Aerobic   Stationary Bike L2 x 6 min     Ankle Exercises: Stretches   Soleus Stretch 3 reps;30 seconds  Lt; standing   Gastroc Stretch 3 reps;30 seconds  Lt; standing     Ankle Exercises: Standing   SLS LLE 2x30 sec with 1-2 UE support   Heel Raises 20 reps   Toe Raise 20 reps   Side Shuffle (Round Trip) 6" x 2 each direction   Other Standing Ankle Exercises tandem stance 3x30 sec bil                  PT Short Term Goals - 09/19/16 1454      PT SHORT TERM GOAL #1   Title verbalize understanding of fall prevention strategies to decrease risk of reinjury (10/13/16)   Status On-going     PT SHORT TERM GOAL #2   Title improve BERG balance score to >/= 46/56 for decreased fall risk (10/13/16)   Status On-going     PT SHORT TERM GOAL #3   Title improve Lt ankle swelling to </= 0.5 cm of Rt ankle figure-8 measurements for improved motion and mobility (10/13/16)   Status On-going     PT SHORT TERM GOAL #4   Title report no pain with ADLs or grocery shopping for improved function (10/13/16)   Status On-going           PT Long Term Goals - 09/19/16 1454      PT LONG TERM GOAL #1   Title independent with HEP (11/10/16)   Status On-going     PT LONG TERM GOAL #2   Title improve BERG balance score to >/= 48/56 for improved balance (11/10/16)   Status On-going     PT LONG TERM GOAL #3   Title improve LLE strength to at least 4/5 for improved function and in order to return to square dancing (11/11/16)   Status On-going     PT LONG TERM GOAL #4   Title improve timed up and go to < 12 sec for improved mobility and decreased fall risk (11/11/16)   Status  On-going     PT LONG TERM GOAL #5   Title perform  5x STS in < 13 sec for improved functional strength (11/11/16)   Status On-going               Plan - 09/29/16 1203    Clinical Impression Statement Pt with improved pain today only c/o expected soreness around ankle.  Swelling much improved today with minimal edema noted.  Pt tolerated increased weight bearing activities well with expected mild increase in discomfort, but overall session tolerated well.  Initiated gait training without ASO today, and no increase in pain reported.  Will continue to benefit from PT to maximize function.   PT Treatment/Interventions ADLs/Self Care Home Management;Cryotherapy;Electrical Stimulation;Moist Heat;Ultrasound;Neuromuscular re-education;Balance training;Therapeutic exercise;Therapeutic activities;Functional mobility training;Stair training;Gait training;Patient/family education;Manual techniques;Passive range of motion;Vasopneumatic Device;Taping;Dry needling   PT Next Visit Plan gastroc stretching, balance, strengthening, gait, edema management and modalities PRN   Consulted and Agree with Plan of Care Patient      Patient will benefit from skilled therapeutic intervention in order to improve the following deficits and impairments:  Abnormal gait, Decreased balance, Decreased mobility, Difficulty walking, Decreased range of motion, Impaired flexibility, Pain, Increased edema, Decreased strength, Decreased knowledge of use of DME  Visit Diagnosis: Pain in left ankle and joints of left foot  Stiffness of left ankle, not elsewhere classified  Other abnormalities of gait and mobility  Unsteadiness on feet  Muscle weakness (generalized)     Problem List Patient Active Problem List   Diagnosis Date Noted  . Pigmented villonodular synovitis of knee, left 03/22/2016  . Abnormal radionuclide bone scan 08/16/2015  . Ankle fracture, bimalleolar, closed 08/16/2015  . HLD (hyperlipidemia)  07/28/2015  . Fatty liver 09/09/2014  . Lumbago 07/07/2014  . Abnormality of gait 03/27/2014  . Memory loss 01/08/2014  . 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 Carcinoma of head of pancreas ([place]) 07/01/2013  . 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 Paresthesia of foot 06/06/2012  . 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 Colon polyps 06/05/2008  . Osteoarthritis 06/04/2008  . Chronic diarrhea 04/22/2008  . Hypothyroidism 02/26/2007  . Adjustment disorder with mixed anxiety and depressed mood 02/26/2007  . Unspecified glaucoma 02/26/2007  . Essential hypertension 02/26/2007  . Mitral valve disease 02/26/2007  . Allergic rhinitis 02/26/2007  . GERD 02/26/2007  . Hiatal hernia 02/26/2007  . Osteoporosis 02/26/2007      Laureen Abrahams, PT, DPT 09/29/16 12:05 PM    [facility] [place], [place], [address] Phone: [phone]   Fax:  [phone]  Name: [name] MRN: [id number] Date of Birth: 13-Aug-1940

## 2016-10-03 ENCOUNTER — Ambulatory Visit (INDEPENDENT_AMBULATORY_CARE_PROVIDER_SITE_OTHER): Payer: Medicare Other | Admitting: Physical Therapy

## 2016-10-03 DIAGNOSIS — R2689 Other abnormalities of gait and mobility: Secondary | ICD-10-CM | POA: Diagnosis not present

## 2016-10-03 DIAGNOSIS — M25572 Pain in left ankle and joints of left foot: Secondary | ICD-10-CM | POA: Diagnosis not present

## 2016-10-03 DIAGNOSIS — M25672 Stiffness of left ankle, not elsewhere classified: Secondary | ICD-10-CM

## 2016-10-03 DIAGNOSIS — R2681 Unsteadiness on feet: Secondary | ICD-10-CM

## 2016-10-03 DIAGNOSIS — M6281 Muscle weakness (generalized): Secondary | ICD-10-CM | POA: Diagnosis not present

## 2016-10-03 NOTE — Therapy (Signed)
[facility] [address] [place], [place], [address] Phone: [phone]   Fax:  [phone]  Physical Therapy Treatment  Patient Details  Name: [name] MRN: [id number] Date of Birth: 04/11/1941 Referring Provider: Dr. Briscoe Deutscher  Encounter Date: 10/03/2016      PT End of Session - 10/03/16 1112    Visit Number 6   Number of Visits 16   Date for PT Re-Evaluation 11/10/16   PT Start Time 1016   PT Stop Time 1057   PT Time Calculation (min) 41 min   Activity Tolerance Patient tolerated treatment well;No increased pain   Behavior During Therapy WFL for tasks assessed/performed      Past Medical History:  Diagnosis Date  . Allergic rhinitis   . Cancer ([place]) 07/10/13   Pancreatic cancer with MRI scan 06-19-13  . Chronic maxillary sinusitis    neti pot  . Depression    alone a lot  . Eustachian tube dysfunction   . GERD (gastroesophageal reflux disease)   . Glaucoma   . Heart murmur    hx. "MVP" -predental antibiotics  . Hiatal hernia   . Hypertension   . Hypothyroid   . Memory loss    short term memory loss  . Mitral valve prolapse    antibiotics before dental procedures  . Neuropathy   . Osteoarthritis   . Stroke [facility])    mini storkes left leg paraylsis. patient denies weakness 01/08/14.   . Tardive dyskinesia    possibly reglan, vitamin E helps  . Urgency of urination    some UTI in past    Past Surgical History:  Procedure Laterality Date  . 1 baker cyst removed    . ABDOMINAL HYSTERECTOMY     including ovaries  . BACK SURGERY     fusion  . BLEPHAROPLASTY Bilateral    with cataract surgery  . BREAST SURGERY     Biopsy left 2 times  . COLONOSCOPY W/ POLYPECTOMY     2004 last colonoscopy, no polyps  . DILATION AND CURETTAGE OF UTERUS     x3  . ESOPHAGOGASTRODUODENOSCOPY N/A 09/11/2013   Procedure: ESOPHAGOGASTRODUODENOSCOPY (EGD);  Surgeon: Cleotis Nipper, MD;  Location: [facility] ENDOSCOPY;  Service:  Endoscopy;  Laterality: N/A;  Moderate sedation okay if MAC not available  . EUS N/A 07/10/2013   Procedure: ESOPHAGEAL ENDOSCOPIC ULTRASOUND (EUS) RADIAL;  Surgeon: Arta Silence, MD;  Location: [facility] ENDOSCOPY;  Service: Endoscopy;  Laterality: N/A;  . EYE SURGERY Right    cataract  . FINE NEEDLE ASPIRATION N/A 07/10/2013   Procedure: FINE NEEDLE ASPIRATION (FNA) LINEAR;  Surgeon: Arta Silence, MD;  Location: [facility] ENDOSCOPY;  Service: Endoscopy;  Laterality: N/A;  possible fna  . JOINT REPLACEMENT     LTKA  . KNEE SURGERY Left    x 5, total knee Left knee  . LAPAROSCOPY N/A 08/07/2013   Procedure: LAPAROSCOPY DIAGNOSTIC;  Surgeon: [name], MD;  Location: [place];  Service: General;  Laterality: N/A;  . LUMBAR SPINE SURGERY     x2  . LUMBAR SPINE SURGERY     cyst  . ORIF ANKLE FRACTURE Right 08/16/2015   Procedure: OPEN REDUCTION INTERNAL FIXATION (ORIF) ANKLE FRACTURE;  Surgeon: Meredith Pel, MD;  Location: [facility];  Service: Orthopedics;  Laterality: Right;  . RADIOACTIVE SEED GUIDED EXCISIONAL BREAST BIOPSY Left 12/15/2015   Procedure: LEFT RADIOACTIVE SEED GUIDED EXCISIONAL BREAST BIOPSY;  Surgeon: [name], MD;  Location: [facility];  Service:  General;  Laterality: Left;  . WHIPPLE PROCEDURE N/A 08/07/2013   Procedure: WHIPPLE PROCEDURE;  Surgeon: [name], MD;  Location: [facility];  Service: General;  Laterality: N/A;    There were no vitals filed for this visit.      Subjective Assessment - 10/03/16 1019    Subjective ankle is continuing to get better; still having some soreness.  "I don't think it would hurt it to get xrayed though."   Pertinent History depression, glaucoma, HTN, neuropathy, hx CVA, arthritis, tardive dyskinesia   How long can you walk comfortably? "couple hours"-walking around grocery store with cart   Patient Stated Goals return to square dancing   Currently in Pain? Yes   Pain Score 3    Pain Location Ankle   Pain Orientation Left;Lateral   Pain  Descriptors / Indicators Sore   Pain Type Acute pain;Chronic pain   Pain Onset 1 to 4 weeks ago   Pain Frequency Intermittent   Aggravating Factors  walking   Pain Relieving Factors rest, elevation, medication            [facility] PT Assessment - 10/03/16 1022      Figure 8 Edema   Figure 8 - Right  47.5 cm   Figure 8 - Left  49 cm                     [facility] Adult PT Treatment/Exercise - 10/03/16 1025      Ankle Exercises: Seated   Ankle Circles/Pumps 20 reps;Right  pumps and circles each direction     Ankle Exercises: Supine   T-Band 4 way with green theraband x 20 Lt ankle     Ankle Exercises: Standing   SLS LLE 5x10 sec on compliant surface with UE support; single tap to cones with/without cane with min A   Braiding (Round Trip) 10' each direction: crossovers in front and crossovers behind   Other Standing Ankle Exercises simulated square dancing with ASO; minguard A without LOB; square stepping with difficulty stepping backwards with LLE   Other Standing Ankle Exercises step ups onto foam x 10 bil with intermittend UE support; hip abduction and extension on foam x 10 reps bil                  PT Short Term Goals - 09/19/16 1454      PT SHORT TERM GOAL #1   Title verbalize understanding of fall prevention strategies to decrease risk of reinjury (10/13/16)   Status On-going     PT SHORT TERM GOAL #2   Title improve BERG balance score to >/= 46/56 for decreased fall risk (10/13/16)   Status On-going     PT SHORT TERM GOAL #3   Title improve Lt ankle swelling to </= 0.5 cm of Rt ankle figure-8 measurements for improved motion and mobility (10/13/16)   Status On-going     PT SHORT TERM GOAL #4   Title report no pain with ADLs or grocery shopping for improved function (10/13/16)   Status On-going           PT Long Term Goals - 09/19/16 1454      PT LONG TERM GOAL #1   Title independent with HEP (11/10/16)   Status On-going     PT LONG TERM  GOAL #2   Title improve BERG balance score to >/= 48/56 for improved balance (11/10/16)   Status On-going     PT LONG TERM GOAL #3  Title improve LLE strength to at least 4/5 for improved function and in order to return to square dancing (11/11/16)   Status On-going     PT LONG TERM GOAL #4   Title improve timed up and go to < 12 sec for improved mobility and decreased fall risk (11/11/16)   Status On-going     PT LONG TERM GOAL #5   Title perform 5x STS in < 13 sec for improved functional strength (11/11/16)   Status On-going               Plan - 10/03/16 1112    Clinical Impression Statement Pt continues to tolerate each session well without increase in pain.  Pain overall decreasing and only reported as "soreness" which is expected following ankle sprain.  Pt still continues to feel she needs ankle xray despite improvements.  Will continue to benefit from PT to maximize function.   PT Treatment/Interventions ADLs/Self Care Home Management;Cryotherapy;Electrical Stimulation;Moist Heat;Ultrasound;Neuromuscular re-education;Balance training;Therapeutic exercise;Therapeutic activities;Functional mobility training;Stair training;Gait training;Patient/family education;Manual techniques;Passive range of motion;Vasopneumatic Device;Taping;Dry needling   PT Next Visit Plan gastroc stretching, balance, strengthening, gait, edema management and modalities PRN; try simulated square dancing activities   Consulted and Agree with Plan of Care Patient      Patient will benefit from skilled therapeutic intervention in order to improve the following deficits and impairments:  Abnormal gait, Decreased balance, Decreased mobility, Difficulty walking, Decreased range of motion, Impaired flexibility, Pain, Increased edema, Decreased strength, Decreased knowledge of use of DME  Visit Diagnosis: Pain in left ankle and joints of left foot  Stiffness of left ankle, not elsewhere classified  Other  abnormalities of gait and mobility  Unsteadiness on feet  Muscle weakness (generalized)     Problem List Patient Active Problem List   Diagnosis Date Noted  . Pigmented villonodular synovitis of knee, left 03/22/2016  . Abnormal radionuclide bone scan 08/16/2015  . Ankle fracture, bimalleolar, closed 08/16/2015  . HLD (hyperlipidemia) 07/28/2015  . Fatty liver 09/09/2014  . Lumbago 07/07/2014  . Abnormality of gait 03/27/2014  . Memory loss 01/08/2014  . 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 Carcinoma of head of pancreas ([place]) 07/01/2013  . 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 Paresthesia of foot 06/06/2012  . 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 Colon polyps 06/05/2008  . Osteoarthritis 06/04/2008  . Chronic diarrhea 04/22/2008  . Hypothyroidism 02/26/2007  . Adjustment disorder with mixed anxiety and depressed mood 02/26/2007  . Unspecified glaucoma 02/26/2007  . Essential hypertension 02/26/2007  . Mitral valve disease 02/26/2007  . Allergic rhinitis 02/26/2007  . GERD 02/26/2007  . Hiatal hernia 02/26/2007  . Osteoporosis 02/26/2007      Laureen Abrahams, PT, DPT 10/03/16 11:15 AM    [facility] [place], [place], [address] Phone: [phone]   Fax:  [phone]  Name: [name] MRN: [id number] Date of Birth: 30-Sep-1940

## 2016-10-05 ENCOUNTER — Ambulatory Visit
Admission: RE | Admit: 2016-10-05 | Discharge: 2016-10-05 | Disposition: A | Discharge status: Home / Self Care | Payer: Medicare Other | Source: Ambulatory Visit | Attending: Family Medicine | Admitting: Family Medicine

## 2016-10-05 DIAGNOSIS — E2839 Other primary ovarian failure: Secondary | ICD-10-CM

## 2016-10-05 DIAGNOSIS — Z1231 Encounter for screening mammogram for malignant neoplasm of breast: Secondary | ICD-10-CM

## 2016-10-05 DIAGNOSIS — M85852 Other specified disorders of bone density and structure, left thigh: Secondary | ICD-10-CM | POA: Diagnosis not present

## 2016-10-06 ENCOUNTER — Ambulatory Visit (INDEPENDENT_AMBULATORY_CARE_PROVIDER_SITE_OTHER): Payer: Medicare Other | Admitting: Physical Therapy

## 2016-10-06 DIAGNOSIS — M6281 Muscle weakness (generalized): Secondary | ICD-10-CM | POA: Diagnosis not present

## 2016-10-06 DIAGNOSIS — R2681 Unsteadiness on feet: Secondary | ICD-10-CM

## 2016-10-06 DIAGNOSIS — M25572 Pain in left ankle and joints of left foot: Secondary | ICD-10-CM | POA: Diagnosis not present

## 2016-10-06 DIAGNOSIS — R2689 Other abnormalities of gait and mobility: Secondary | ICD-10-CM | POA: Diagnosis not present

## 2016-10-06 DIAGNOSIS — M25672 Stiffness of left ankle, not elsewhere classified: Secondary | ICD-10-CM | POA: Diagnosis not present

## 2016-10-06 NOTE — Therapy (Signed)
[facility] [address] [place], [place], [address] Phone: [phone]   Fax:  [phone]  Physical Therapy Treatment  Patient Details  Name: [name] MRN: [id number] Date of Birth: 05-Jan-1941 Referring Provider: Dr. Briscoe Deutscher  Encounter Date: 10/06/2016      PT End of Session - 10/06/16 1057    Visit Number 7   Number of Visits 16   Date for PT Re-Evaluation 11/10/16   PT Start Time 6568   PT Stop Time 1055   PT Time Calculation (min) 40 min   Activity Tolerance Patient tolerated treatment well   Behavior During Therapy [facility] for tasks assessed/performed      Past Medical History:  Diagnosis Date  . Allergic rhinitis   . Cancer ([place]) 07/10/13   Pancreatic cancer with MRI scan 06-19-13  . Chronic maxillary sinusitis    neti pot  . Depression    alone a lot  . Eustachian tube dysfunction   . GERD (gastroesophageal reflux disease)   . Glaucoma   . Heart murmur    hx. "MVP" -predental antibiotics  . Hiatal hernia   . Hypertension   . Hypothyroid   . Memory loss    short term memory loss  . Mitral valve prolapse    antibiotics before dental procedures  . Neuropathy   . Osteoarthritis   . Stroke [facility])    mini storkes left leg paraylsis. patient denies weakness 01/08/14.   . Tardive dyskinesia    possibly reglan, vitamin E helps  . Urgency of urination    some UTI in past    Past Surgical History:  Procedure Laterality Date  . 1 baker cyst removed    . ABDOMINAL HYSTERECTOMY     including ovaries  . BACK SURGERY     fusion  . BLEPHAROPLASTY Bilateral    with cataract surgery  . BREAST EXCISIONAL BIOPSY     left x2  . BREAST SURGERY     Biopsy left 2 times  . COLONOSCOPY W/ POLYPECTOMY     2004 last colonoscopy, no polyps  . DILATION AND CURETTAGE OF UTERUS     x3  . ESOPHAGOGASTRODUODENOSCOPY N/A 09/11/2013   Procedure: ESOPHAGOGASTRODUODENOSCOPY (EGD);  Surgeon: Cleotis Nipper, MD;  Location: [facility]  ENDOSCOPY;  Service: Endoscopy;  Laterality: N/A;  Moderate sedation okay if MAC not available  . EUS N/A 07/10/2013   Procedure: ESOPHAGEAL ENDOSCOPIC ULTRASOUND (EUS) RADIAL;  Surgeon: Arta Silence, MD;  [facility] ENDOSCOPY;  Service: Endoscopy;  Laterality: N/A;  . EYE SURGERY Right    cataract  . FINE NEEDLE ASPIRATION N/A 07/10/2013   Procedure: FINE NEEDLE ASPIRATION (FNA) LINEAR;  Surgeon: Arta Silence, MD;  [facility] ENDOSCOPY;  Service: Endoscopy;  Laterality: N/A;  possible fna  . JOINT REPLACEMENT     LTKA  . KNEE SURGERY Left    x 5, total knee Left knee  . LAPAROSCOPY N/A 08/07/2013   Procedure: LAPAROSCOPY DIAGNOSTIC;  Surgeon: [name], MD;  Location: [facility];  Service: General;  Laterality: N/A;  . LUMBAR SPINE SURGERY     x2  . LUMBAR SPINE SURGERY     cyst  . ORIF ANKLE FRACTURE Right 08/16/2015   Procedure: OPEN REDUCTION INTERNAL FIXATION (ORIF) ANKLE FRACTURE;  Surgeon: Meredith Pel, MD;  Location: [place];  Service: Orthopedics;  Laterality: Right;  . RADIOACTIVE SEED GUIDED EXCISIONAL BREAST BIOPSY Left 12/15/2015   Procedure: LEFT RADIOACTIVE SEED GUIDED EXCISIONAL BREAST BIOPSY;  Surgeon:  [name], MD;  Location: [place];  Service: General;  Laterality: Left;  . WHIPPLE PROCEDURE N/A 08/07/2013   Procedure: WHIPPLE PROCEDURE;  Surgeon: [name], MD;  Location: [facility];  Service: General;  Laterality: N/A;    There were no vitals filed for this visit.      Subjective Assessment - 10/06/16 1017    Subjective felt a little dizzy yesterday; and feels a little dizzy still now.  reports it started around 12 yesterday and unsure why.     Patient Stated Goals return to square dancing   Pain Score 2    Pain Location Ankle   Pain Orientation Left;Lateral   Pain Descriptors / Indicators Sore   Pain Type Chronic pain;Acute pain   Pain Onset 1 to 4 weeks ago   Pain Frequency Intermittent   Aggravating Factors  walking   Pain Relieving Factors rest,  elevation, meds                         [facility] Adult PT Treatment/Exercise - 10/06/16 1027      Ambulation/Gait   Gait Comments amb 100' without brace with SPC and supervision-cues for heel strike and equal step length; sidestepping 50'x2 with min A     Ankle Exercises: Aerobic   Stationary Bike L2 x 6 min     Ankle Exercises: Standing   SLS LLE 5x15 sec on compliant surface   Heel Raises 20 reps   Toe Raise 20 reps   Side Shuffle (Round Trip) 6" x 2 each direction     Ankle Exercises: Seated   BAPS Sitting;Level 2  20 reps, DF/PF, INV/EV, CW/CCW     Ankle Exercises: Stretches   Gastroc Stretch 2 reps;30 seconds  Lt; seated with strap                  PT Short Term Goals - 09/19/16 1454      PT SHORT TERM GOAL #1   Title verbalize understanding of fall prevention strategies to decrease risk of reinjury (10/13/16)   Status On-going     PT SHORT TERM GOAL #2   Title improve BERG balance score to >/= 46/56 for decreased fall risk (10/13/16)   Status On-going     PT SHORT TERM GOAL #3   Title improve Lt ankle swelling to </= 0.5 cm of Rt ankle figure-8 measurements for improved motion and mobility (10/13/16)   Status On-going     PT SHORT TERM GOAL #4   Title report no pain with ADLs or grocery shopping for improved function (10/13/16)   Status On-going           PT Long Term Goals - 09/19/16 1454      PT LONG TERM GOAL #1   Title independent with HEP (11/10/16)   Status On-going     PT LONG TERM GOAL #2   Title improve BERG balance score to >/= 48/56 for improved balance (11/10/16)   Status On-going     PT LONG TERM GOAL #3   Title improve LLE strength to at least 4/5 for improved function and in order to return to square dancing (11/11/16)   Status On-going     PT LONG TERM GOAL #4   Title improve timed up and go to < 12 sec for improved mobility and decreased fall risk (11/11/16)   Status On-going     PT LONG TERM GOAL #5   Title  perform 5x  STS in < 13 sec for improved functional strength (11/11/16)   Status On-going               Plan - 10/06/16 1057    Clinical Impression Statement Pt reports pain overall decreasing and only a 2/10 today.  Continues to do well with PT.  Pt safe to amb indoors without ASO as long as pain is managed, and educated pt to put brace back on if needed.  Progressing well.     PT Treatment/Interventions ADLs/Self Care Home Management;Cryotherapy;Electrical Stimulation;Moist Heat;Ultrasound;Neuromuscular re-education;Balance training;Therapeutic exercise;Therapeutic activities;Functional mobility training;Stair training;Gait training;Patient/family education;Manual techniques;Passive range of motion;Vasopneumatic Device;Taping;Dry needling   PT Next Visit Plan gastroc stretching, balance, strengthening, gait, edema management and modalities PRN; try simulated square dancing activities; begin assessing STGs   Consulted and Agree with Plan of Care Patient      Patient will benefit from skilled therapeutic intervention in order to improve the following deficits and impairments:  Abnormal gait, Decreased balance, Decreased mobility, Difficulty walking, Decreased range of motion, Impaired flexibility, Pain, Increased edema, Decreased strength, Decreased knowledge of use of DME  Visit Diagnosis: Pain in left ankle and joints of left foot  Stiffness of left ankle, not elsewhere classified  Other abnormalities of gait and mobility  Unsteadiness on feet  Muscle weakness (generalized)     Problem List Patient Active Problem List   Diagnosis Date Noted  . Pigmented villonodular synovitis of knee, left 03/22/2016  . Abnormal radionuclide bone scan 08/16/2015  . Ankle fracture, bimalleolar, closed 08/16/2015  . HLD (hyperlipidemia) 07/28/2015  . Fatty liver 09/09/2014  . Lumbago 07/07/2014  . Abnormality of gait 03/27/2014  . Memory loss 01/08/2014  . Exocrine pancreatic  insufficiency 12/30/2013  . Carcinoma of head of pancreas ([place]) 07/01/2013  . 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 Paresthesia of foot 06/06/2012  . 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 Colon polyps 06/05/2008  . Osteoarthritis 06/04/2008  . Chronic diarrhea 04/22/2008  . Hypothyroidism 02/26/2007  . Adjustment disorder with mixed anxiety and depressed mood 02/26/2007  . Unspecified glaucoma 02/26/2007  . Essential hypertension 02/26/2007  . Mitral valve disease 02/26/2007  . Allergic rhinitis 02/26/2007  . GERD 02/26/2007  . Hiatal hernia 02/26/2007  . Osteoporosis 02/26/2007     Laureen Abrahams, PT, DPT 10/06/16 11:00 AM    [facility], [place], [address] Phone: [phone]   Fax:  [phone]  Name: [name] MRN: [id number] Date of Birth: [birth year]

## 2016-10-10 ENCOUNTER — Ambulatory Visit (INDEPENDENT_AMBULATORY_CARE_PROVIDER_SITE_OTHER): Payer: Medicare Other | Admitting: Physical Therapy

## 2016-10-10 DIAGNOSIS — M25572 Pain in left ankle and joints of left foot: Secondary | ICD-10-CM | POA: Diagnosis not present

## 2016-10-10 DIAGNOSIS — R2681 Unsteadiness on feet: Secondary | ICD-10-CM | POA: Diagnosis not present

## 2016-10-10 DIAGNOSIS — M6281 Muscle weakness (generalized): Secondary | ICD-10-CM

## 2016-10-10 NOTE — Patient Instructions (Signed)
ABDUCTION: Standing -   Stand at counter, feet flat.Lift leg out to side. Complete _2 sets of _5_ repetitions. Perform _1__ sessions per day. Do both sides.  Madelyn Flavors, PT 10/10/16 [facility] [place], [place], [address] Phone: [phone]   Fax:  [phone]

## 2016-10-10 NOTE — Therapy (Signed)
[facility] [address] [place], [place], [address] Phone: [phone]   Fax:  [phone]  Physical Therapy Treatment  Patient Details  Name: [name] MRN: [id number] Date of Birth: [birth year] Referring Provider: Dr. Briscoe Deutscher  Encounter Date: 10/10/2016      PT End of Session - 10/10/16 1033    Visit Number 8   Number of Visits 16   Date for PT Re-Evaluation 11/10/16   PT Start Time 1029   PT Stop Time 1112   PT Time Calculation (min) 43 min   Activity Tolerance Patient tolerated treatment well   Behavior During Therapy [facility] for tasks assessed/performed      Past Medical History:  Diagnosis Date  . Allergic rhinitis   . Cancer ([place]) 07/10/13   Pancreatic cancer with MRI scan 06-19-13  . Chronic maxillary sinusitis    neti pot  . Depression    alone a lot  . Eustachian tube dysfunction   . GERD (gastroesophageal reflux disease)   . Glaucoma   . Heart murmur    hx. "MVP" -predental antibiotics  . Hiatal hernia   . Hypertension   . Hypothyroid   . Memory loss    short term memory loss  . Mitral valve prolapse    antibiotics before dental procedures  . Neuropathy   . Osteoarthritis   . Stroke [facility])    mini storkes left leg paraylsis. patient denies weakness 01/08/14.   . Tardive dyskinesia    possibly reglan, vitamin E helps  . Urgency of urination    some UTI in past    Past Surgical History:  Procedure Laterality Date  . 1 baker cyst removed    . ABDOMINAL HYSTERECTOMY     including ovaries  . BACK SURGERY     fusion  . BLEPHAROPLASTY Bilateral    with cataract surgery  . BREAST EXCISIONAL BIOPSY     left x2  . BREAST SURGERY     Biopsy left 2 times  . COLONOSCOPY W/ POLYPECTOMY     2004 last colonoscopy, no polyps  . DILATION AND CURETTAGE OF UTERUS     x3  . ESOPHAGOGASTRODUODENOSCOPY N/A 09/11/2013   Procedure: ESOPHAGOGASTRODUODENOSCOPY (EGD);  Surgeon: Cleotis Nipper, MD;  Location: [facility]  ENDOSCOPY;  Service: Endoscopy;  Laterality: N/A;  Moderate sedation okay if MAC not available  . EUS N/A 07/10/2013   Procedure: ESOPHAGEAL ENDOSCOPIC ULTRASOUND (EUS) RADIAL;  Surgeon: Arta Silence, MD;  Location: [facility] ENDOSCOPY;  Service: Endoscopy;  Laterality: N/A;  . EYE SURGERY Right    cataract  . FINE NEEDLE ASPIRATION N/A 07/10/2013   Procedure: FINE NEEDLE ASPIRATION (FNA) LINEAR;  Surgeon: Arta Silence, MD;  Location: [facility] ENDOSCOPY;  Service: Endoscopy;  Laterality: N/A;  possible fna  . JOINT REPLACEMENT     LTKA  . KNEE SURGERY Left    x 5, total knee Left knee  . LAPAROSCOPY N/A 08/07/2013   Procedure: LAPAROSCOPY DIAGNOSTIC;  Surgeon: [name], MD;  Location: [place];  Service: General;  Laterality: N/A;  . LUMBAR SPINE SURGERY     x2  . LUMBAR SPINE SURGERY     cyst  . ORIF ANKLE FRACTURE Right 08/16/2015   Procedure: OPEN REDUCTION INTERNAL FIXATION (ORIF) ANKLE FRACTURE;  Surgeon: Meredith Pel, MD;  Location: [place];  Service: Orthopedics;  Laterality: Right;  . RADIOACTIVE SEED GUIDED EXCISIONAL BREAST BIOPSY Left 12/15/2015   Procedure: LEFT RADIOACTIVE SEED GUIDED EXCISIONAL BREAST BIOPSY;  Surgeon:  [name], MD;  Location: [place];  Service: General;  Laterality: Left;  . WHIPPLE PROCEDURE N/A 08/07/2013   Procedure: WHIPPLE PROCEDURE;  Surgeon: [name], MD;  Location: [facility];  Service: General;  Laterality: N/A;    There were no vitals filed for this visit.      Subjective Assessment - 10/10/16 1033    Subjective Patient states she still has dizziness now and then. She reports she does not sleep well.    Pertinent History depression, glaucoma, HTN, neuropathy, hx CVA, arthritis, tardive dyskinesia   Limitations Standing;Walking;House hold activities   How long can you stand comfortably? "couple hours"   How long can you walk comfortably? "couple hours"-walking around grocery store with cart   Patient Stated Goals return to square dancing   Currently  in Pain? Yes   Pain Score 2    Pain Location Ankle   Pain Orientation Left;Lateral   Pain Descriptors / Indicators Sore   Pain Type Chronic pain;Acute pain   Pain Onset 1 to 4 weeks ago   Pain Frequency Intermittent   Aggravating Factors  walking   Pain Relieving Factors rest, elevation, meds            [facility] PT Assessment - 10/10/16 0001      Berg Balance Test   Sit to Stand Able to stand without using hands and stabilize independently   Standing Unsupported Able to stand safely 2 minutes   Sitting with Back Unsupported but Feet Supported on Floor or Stool Able to sit safely and securely 2 minutes   Stand to Sit Sits safely with minimal use of hands   Transfers Able to transfer safely, minor use of hands   Standing Unsupported with Eyes Closed Able to stand 10 seconds with supervision   Standing Ubsupported with Feet Together Able to place feet together independently and stand 1 minute safely   From Standing, Reach Forward with Outstretched Arm Can reach confidently >25 cm (10")   From Standing Position, Pick up Object from Floor Able to pick up shoe, needs supervision   From Standing Position, Turn to Look Behind Over each Shoulder Looks behind from both sides and weight shifts well   Turn 360 Degrees Able to turn 360 degrees safely in 4 seconds or less   Standing Unsupported, Alternately Place Feet on Step/Stool Able to complete 4 steps without aid or supervision  using treadmill as step; she scored a 3 using airex   Standing Unsupported, One Foot in Front Able to plae foot ahead of the other independently and hold 30 seconds   Standing on One Leg Able to lift leg independently and hold equal to or more than 3 seconds  after mulitiple tries could hold >5 sec   Total Score 49                     [facility] Adult PT Treatment/Exercise - 10/10/16 0001      Ankle Exercises: Standing   Other Standing Ankle Exercises step ups on treadmill 2x10 B; standing hip ABD 2x5 B  with tactile cues  One UE support   Other Standing Ankle Exercises step ups onto foam x 10 bil with intermittend UE support; hip abduction and extension on foam x 10 reps bil     Ankle Exercises: Sidelying   Other Sidelying Ankle Exercises hip ABD on L x 5; required significant tactile cues for form so moved to standing     Ankle Exercises: Aerobic  Stationary Bike L2 x 6 min                PT Education - 10/10/16 1237    Education provided Yes   Education Details HEP;    Person(s) Educated Patient   Methods Explanation;Demonstration;Tactile cues;Handout;Verbal cues   Comprehension Verbalized understanding;Returned demonstration          PT Short Term Goals - 10/10/16 1036      PT SHORT TERM GOAL #1   Title verbalize understanding of fall prevention strategies to decrease risk of reinjury (10/13/16)   Baseline met on 07/23/14   Time 4   Period Weeks   Status Achieved     PT SHORT TERM GOAL #2   Title improve BERG balance score to >/= 46/56 for decreased fall risk (10/13/16)   Baseline 49/56 on 10/10/16   Time 4   Period Weeks   Status Achieved     PT SHORT TERM GOAL #4   Title report no pain with ADLs or grocery shopping for improved function (10/13/16)   Baseline still feels soreness with ADLs 1-2/10   Time 4   Period Weeks   Status On-going           PT Long Term Goals - 10/10/16 1308      PT LONG TERM GOAL #2   Title improve BERG balance score to >/= 48/56 for improved balance (11/10/16)   Baseline 49/56 on 10/10/16.   Time 8   Period Weeks   Status Achieved               Plan - 10/10/16 1305    Clinical Impression Statement Patient continues to report 1-2/10 pain in her left ankle. She has made significant improvements on BERG from 40 to 49/56 meeting her STG and LTG. She was able to verbalize techniques to prevent falls without VCs meeting STG #1. She demonstrates functional weakness in L glut med with step ups and SLS activities. Standing  hip ABD was added to HEP. Patient to bring in her HEP next visit to modify exercises/frequency etc.  Remaining goals are ongoing.   Rehab Potential Good   Clinical Impairments Affecting Rehab Potential multiple comorbidities   PT Frequency 2x / week   PT Duration 8 weeks   PT Treatment/Interventions ADLs/Self Care Home Management;Cryotherapy;Electrical Stimulation;Moist Heat;Ultrasound;Neuromuscular re-education;Balance training;Therapeutic exercise;Therapeutic activities;Functional mobility training;Stair training;Gait training;Patient/family education;Manual techniques;Passive range of motion;Vasopneumatic Device;Taping;Dry needling   PT Next Visit Plan gastroc stretching, balance, strengthening, gait, edema management and modalities PRN; try simulated square dancing activities; begin assessing STGs   PT Home Exercise Plan standing hip ABD   Consulted and Agree with Plan of Care Patient      Patient will benefit from skilled therapeutic intervention in order to improve the following deficits and impairments:  Abnormal gait, Decreased balance, Decreased mobility, Difficulty walking, Decreased range of motion, Impaired flexibility, Pain, Increased edema, Decreased strength, Decreased knowledge of use of DME  Visit Diagnosis: Pain in left ankle and joints of left foot  Unsteadiness on feet  Muscle weakness (generalized)     Problem List Patient Active Problem List   Diagnosis Date Noted  . Pigmented villonodular synovitis of knee, left 03/22/2016  . Abnormal radionuclide bone scan 08/16/2015  . Ankle fracture, bimalleolar, closed 08/16/2015  . HLD (hyperlipidemia) 07/28/2015  . Fatty liver 09/09/2014  . Lumbago 07/07/2014  . Abnormality of gait 03/27/2014  . Memory loss 01/08/2014  . 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 Carcinoma of head of  pancreas ([place]) 07/01/2013  . 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 Paresthesia of foot 06/06/2012  .  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 Colon polyps 06/05/2008  . Osteoarthritis 06/04/2008  . Chronic diarrhea 04/22/2008  . Hypothyroidism 02/26/2007  . Adjustment disorder with mixed anxiety and depressed mood 02/26/2007  . Unspecified glaucoma 02/26/2007  . Essential hypertension 02/26/2007  . Mitral valve disease 02/26/2007  . Allergic rhinitis 02/26/2007  . GERD 02/26/2007  . Hiatal hernia 02/26/2007  . Osteoporosis 02/26/2007    Madelyn Flavors PT 10/10/2016, 1:16 PM  [facility] [place], [place], [address] Phone: [phone]   Fax:  [phone]  Name: [name] MRN: [id number] Date of Birth: 23-Oct-1940

## 2016-10-14 ENCOUNTER — Ambulatory Visit (INDEPENDENT_AMBULATORY_CARE_PROVIDER_SITE_OTHER): Payer: Medicare Other | Admitting: Physical Therapy

## 2016-10-14 DIAGNOSIS — M6281 Muscle weakness (generalized): Secondary | ICD-10-CM | POA: Diagnosis not present

## 2016-10-14 DIAGNOSIS — R2681 Unsteadiness on feet: Secondary | ICD-10-CM

## 2016-10-14 DIAGNOSIS — M25672 Stiffness of left ankle, not elsewhere classified: Secondary | ICD-10-CM

## 2016-10-14 DIAGNOSIS — M25572 Pain in left ankle and joints of left foot: Secondary | ICD-10-CM

## 2016-10-14 NOTE — Therapy (Signed)
[facility] [address] [place], [place], [address] Phone: [phone]   Fax:  [phone]  Physical Therapy Treatment  Patient Details  Name: [name] MRN: [id number] Date of Birth: [birth year] Referring Provider: Dr. Briscoe Deutscher  Encounter Date: 10/14/2016      PT End of Session - 10/14/16 1020    Visit Number 9   Number of Visits 16   Date for PT Re-Evaluation 11/10/16   PT Start Time 1019   PT Stop Time 1100   PT Time Calculation (min) 41 min   Activity Tolerance Patient tolerated treatment well   Behavior During Therapy [facility] for tasks assessed/performed      Past Medical History:  Diagnosis Date  . Allergic rhinitis   . Cancer ([place]) 07/10/13   Pancreatic cancer with MRI scan 06-19-13  . Chronic maxillary sinusitis    neti pot  . Depression    alone a lot  . Eustachian tube dysfunction   . GERD (gastroesophageal reflux disease)   . Glaucoma   . Heart murmur    hx. "MVP" -predental antibiotics  . Hiatal hernia   . Hypertension   . Hypothyroid   . Memory loss    short term memory loss  . Mitral valve prolapse    antibiotics before dental procedures  . Neuropathy   . Osteoarthritis   . Stroke [facility])    mini storkes left leg paraylsis. patient denies weakness 01/08/14.   . Tardive dyskinesia    possibly reglan, vitamin E helps  . Urgency of urination    some UTI in past    Past Surgical History:  Procedure Laterality Date  . 1 baker cyst removed    . ABDOMINAL HYSTERECTOMY     including ovaries  . BACK SURGERY     fusion  . BLEPHAROPLASTY Bilateral    with cataract surgery  . BREAST EXCISIONAL BIOPSY     left x2  . BREAST SURGERY     Biopsy left 2 times  . COLONOSCOPY W/ POLYPECTOMY     2004 last colonoscopy, no polyps  . DILATION AND CURETTAGE OF UTERUS     x3  . ESOPHAGOGASTRODUODENOSCOPY N/A 09/11/2013   Procedure: ESOPHAGOGASTRODUODENOSCOPY (EGD);  Surgeon: Cleotis Nipper, MD;  Location: [facility]  ENDOSCOPY;  Service: Endoscopy;  Laterality: N/A;  Moderate sedation okay if MAC not available  . EUS N/A 07/10/2013   Procedure: ESOPHAGEAL ENDOSCOPIC ULTRASOUND (EUS) RADIAL;  Surgeon: Arta Silence, MD;  [facility] ENDOSCOPY;  Service: Endoscopy;  Laterality: N/A;  . EYE SURGERY Right    cataract  . FINE NEEDLE ASPIRATION N/A 07/10/2013   Procedure: FINE NEEDLE ASPIRATION (FNA) LINEAR;  Surgeon: Arta Silence, MD;  [facility] ENDOSCOPY;  Service: Endoscopy;  Laterality: N/A;  possible fna  . JOINT REPLACEMENT     LTKA  . KNEE SURGERY Left    x 5, total knee Left knee  . LAPAROSCOPY N/A 08/07/2013   Procedure: LAPAROSCOPY DIAGNOSTIC;  Surgeon: [name], MD;  Location: [facility] [place];  Service: General;  Laterality: N/A;  . LUMBAR SPINE SURGERY     x2  . LUMBAR SPINE SURGERY     cyst  . ORIF ANKLE FRACTURE Right 08/16/2015   Procedure: OPEN REDUCTION INTERNAL FIXATION (ORIF) ANKLE FRACTURE;  Surgeon: Meredith Pel, MD;  Location: [place];  Service: Orthopedics;  Laterality: Right;  . RADIOACTIVE SEED GUIDED EXCISIONAL BREAST BIOPSY Left 12/15/2015   Procedure: LEFT RADIOACTIVE SEED GUIDED EXCISIONAL BREAST BIOPSY;  Surgeon:  [name], MD;  Location: [place];  Service: General;  Laterality: Left;  . WHIPPLE PROCEDURE N/A 08/07/2013   Procedure: WHIPPLE PROCEDURE;  Surgeon: [name], MD;  Location: [place];  Service: General;  Laterality: N/A;    There were no vitals filed for this visit.      Subjective Assessment - 10/14/16 1020    Subjective Patient states she was not dizzy yesterday or this morning.   Pertinent History depression, glaucoma, HTN, neuropathy, hx CVA, arthritis, tardive dyskinesia   Limitations Standing;Walking;House hold activities   How long can you stand comfortably? "couple hours"   How long can you walk comfortably? "couple hours"-walking around grocery store with cart   Patient Stated Goals return to square dancing   Currently in Pain? No/denies                          [facility] Adult PT Treatment/Exercise - 10/14/16 0001      Ankle Exercises: Aerobic   Stationary Bike L2 x 6 min     Ankle Exercises: Stretches   Gastroc Stretch 2 reps;30 seconds  Lt; seated with strap     Ankle Exercises: Standing   BAPS Standing;Level 2  20 reps DF/PF and Inv/Ever; tactile cues to avoid hip help   SLS LLE noncompliant and compliant surface multiple reps with intermittent UE support. 7 sec no support best trial   Heel Raises 20 reps   Toe Raise 20 reps  limited RoM by brace on L   Side Shuffle (Round Trip) 6" x 2 each direction   Other Standing Ankle Exercises step ups on treadmill 1x10 B; standing hip ABD 2x10 B   One UE support   Other Standing Ankle Exercises steps onto foam x 20 LLE no UE support with one LOB                PT Education - 10/14/16 1103    Education provided Yes   Education Details reviewed current HEP and told pt she could hold ROM and continue with tband   Person(s) Educated Patient   Methods Explanation   Comprehension Verbalized understanding          PT Short Term Goals - 10/10/16 1036      PT SHORT TERM GOAL #1   Title verbalize understanding of fall prevention strategies to decrease risk of reinjury (10/13/16)   Baseline met on 07/23/14   Time 4   Period Weeks   Status Achieved     PT SHORT TERM GOAL #2   Title improve BERG balance score to >/= 46/56 for decreased fall risk (10/13/16)   Baseline 49/56 on 10/10/16   Time 4   Period Weeks   Status Achieved     PT SHORT TERM GOAL #4   Title report no pain with ADLs or grocery shopping for improved function (10/13/16)   Baseline still feels soreness with ADLs 1-2/10   Time 4   Period Weeks   Status On-going           PT Long Term Goals - 10/10/16 1308      PT LONG TERM GOAL #2   Title improve BERG balance score to >/= 48/56 for improved balance (11/10/16)   Baseline 49/56 on 10/10/16.   Time 8   Period Weeks   Status  Achieved               Plan - 10/14/16 1105    Clinical  Impression Statement Patient did very well today. She continues to experience intermittent LOB with compliant surface activities. She did well with BAPs requiring some cues to avoid hip compensation.   PT Treatment/Interventions ADLs/Self Care Home Management;Cryotherapy;Electrical Stimulation;Moist Heat;Ultrasound;Neuromuscular re-education;Balance training;Therapeutic exercise;Therapeutic activities;Functional mobility training;Stair training;Gait training;Patient/family education;Manual techniques;Passive range of motion;Vasopneumatic Device;Taping;Dry needling   PT Next Visit Plan GCode; gastroc stretching, balance, strengthening, gait, edema management and modalities PRN; try simulated square dancing activities; begin assessing STGs      Patient will benefit from skilled therapeutic intervention in order to improve the following deficits and impairments:  Abnormal gait, Decreased balance, Decreased mobility, Difficulty walking, Decreased range of motion, Impaired flexibility, Pain, Increased edema, Decreased strength, Decreased knowledge of use of DME  Visit Diagnosis: Pain in left ankle and joints of left foot  Unsteadiness on feet  Muscle weakness (generalized)  Stiffness of left ankle, not elsewhere classified     Problem List Patient Active Problem List   Diagnosis Date Noted  . Pigmented villonodular synovitis of knee, left 03/22/2016  . Abnormal radionuclide bone scan 08/16/2015  . Ankle fracture, bimalleolar, closed 08/16/2015  . HLD (hyperlipidemia) 07/28/2015  . Fatty liver 09/09/2014  . Lumbago 07/07/2014  . Abnormality of gait 03/27/2014  . Memory loss 01/08/2014  . 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 Carcinoma of head of pancreas ([place]) 07/01/2013  . 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 Paresthesia of foot 06/06/2012  . 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 Colon  polyps 06/05/2008  . Osteoarthritis 06/04/2008  . Chronic diarrhea 04/22/2008  . Hypothyroidism 02/26/2007  . Adjustment disorder with mixed anxiety and depressed mood 02/26/2007  . Unspecified glaucoma 02/26/2007  . Essential hypertension 02/26/2007  . Mitral valve disease 02/26/2007  . Allergic rhinitis 02/26/2007  . GERD 02/26/2007  . Hiatal hernia 02/26/2007  . Osteoporosis 02/26/2007    Madelyn Flavors PT 10/14/2016, 12:38 PM  [facility] [address] [place], [place], [address] Phone: [phone]   Fax:  [phone]  Name: LINEA CALLES MRN: [id number] Date of Birth: 21-May-1941

## 2016-10-17 ENCOUNTER — Ambulatory Visit (INDEPENDENT_AMBULATORY_CARE_PROVIDER_SITE_OTHER): Payer: Medicare Other | Admitting: Physical Therapy

## 2016-10-17 DIAGNOSIS — R2689 Other abnormalities of gait and mobility: Secondary | ICD-10-CM | POA: Diagnosis not present

## 2016-10-17 DIAGNOSIS — M25672 Stiffness of left ankle, not elsewhere classified: Secondary | ICD-10-CM | POA: Diagnosis not present

## 2016-10-17 DIAGNOSIS — R2681 Unsteadiness on feet: Secondary | ICD-10-CM

## 2016-10-17 DIAGNOSIS — M6281 Muscle weakness (generalized): Secondary | ICD-10-CM | POA: Diagnosis not present

## 2016-10-17 DIAGNOSIS — M25572 Pain in left ankle and joints of left foot: Secondary | ICD-10-CM

## 2016-10-17 NOTE — Patient Instructions (Signed)
Feet Together (Compliant Surface) Varied Arm Positions - Eyes Closed    Stand on compliant surface: ___pillow or cushion___ with feet together and arms at your side. Close eyes and try to stand still.  Hold__10-15__ seconds. Repeat _5___ times per session. Do __2-3__ sessions per day.  Copyright  VHI. All rights reserved.    Feet Apart (Compliant Surface) Head Motion - Eyes Open    With eyes open, standing on compliant surface: _pillow or cushion__, feet shoulder width apart, move head slowly: up and down 10 times and side to side 10 times. Repeat __1-2__ times per session. Do _2-3___ sessions per day.  Copyright  VHI. All rights reserved.                 Feet Apart, Head Motion - Eyes Closed    With eyes closed and feet shoulder width apart, move head slowly, up and down 10 times and side to side 10 times. Repeat __1-2__ times per session. Do _2-3___ sessions per day.  Copyright  VHI. All rights reserved.

## 2016-10-17 NOTE — Therapy (Signed)
[facility] [address] [place], [place], [address] Phone: [phone]   Fax:  [phone]  Physical Therapy Treatment  Patient Details  Name: [name] MRN: [id number] Date of Birth: 09-11-1940 Referring Provider: Dr. Briscoe Deutscher  Encounter Date: 10/17/2016      PT End of Session - 10/17/16 1353    Visit Number 10   Number of Visits 16   Date for PT Re-Evaluation 11/10/16   PT Start Time 0623   PT Stop Time 1350   PT Time Calculation (min) 47 min   Equipment Utilized During Treatment Gait belt   Activity Tolerance Patient tolerated treatment well   Behavior During Therapy [facility] for tasks assessed/performed      Past Medical History:  Diagnosis Date  . Allergic rhinitis   . Cancer ([place]) 07/10/13   Pancreatic cancer with MRI scan 06-19-13  . Chronic maxillary sinusitis    neti pot  . Depression    alone a lot  . Eustachian tube dysfunction   . GERD (gastroesophageal reflux disease)   . Glaucoma   . Heart murmur    hx. "MVP" -predental antibiotics  . Hiatal hernia   . Hypertension   . Hypothyroid   . Memory loss    short term memory loss  . Mitral valve prolapse    antibiotics before dental procedures  . Neuropathy   . Osteoarthritis   . Stroke [facility]    mini storkes left leg paraylsis. patient denies weakness 01/08/14.   . Tardive dyskinesia    possibly reglan, vitamin E helps  . Urgency of urination    some UTI in past    Past Surgical History:  Procedure Laterality Date  . 1 baker cyst removed    . ABDOMINAL HYSTERECTOMY     including ovaries  . BACK SURGERY     fusion  . BLEPHAROPLASTY Bilateral    with cataract surgery  . BREAST EXCISIONAL BIOPSY     left x2  . BREAST SURGERY     Biopsy left 2 times  . COLONOSCOPY W/ POLYPECTOMY     2004 last colonoscopy, no polyps  . DILATION AND CURETTAGE OF UTERUS     x3  . ESOPHAGOGASTRODUODENOSCOPY N/A 09/11/2013   Procedure: ESOPHAGOGASTRODUODENOSCOPY  (EGD);  Surgeon: Cleotis Nipper, MD;  Location: [facility] ENDOSCOPY;  Service: Endoscopy;  Laterality: N/A;  Moderate sedation okay if MAC not available  . EUS N/A 07/10/2013   Procedure: ESOPHAGEAL ENDOSCOPIC ULTRASOUND (EUS) RADIAL;  Surgeon: Arta Silence, MD;  Location: [facility] ENDOSCOPY;  Service: Endoscopy;  Laterality: N/A;  . EYE SURGERY Right    cataract  . FINE NEEDLE ASPIRATION N/A 07/10/2013   Procedure: FINE NEEDLE ASPIRATION (FNA) LINEAR;  Surgeon: Arta Silence, MD;  Location: [facility] ENDOSCOPY;  Service: Endoscopy;  Laterality: N/A;  possible fna  . JOINT REPLACEMENT     LTKA  . KNEE SURGERY Left    x 5, total knee Left knee  . LAPAROSCOPY N/A 08/07/2013   Procedure: LAPAROSCOPY DIAGNOSTIC;  Surgeon: [name], MD;  Location: [place];  Service: General;  Laterality: N/A;  . LUMBAR SPINE SURGERY     x2  . LUMBAR SPINE SURGERY     cyst  . ORIF ANKLE FRACTURE Right 08/16/2015   Procedure: OPEN REDUCTION INTERNAL FIXATION (ORIF) ANKLE FRACTURE;  Surgeon: Meredith Pel, MD;  Location: [place];  Service: Orthopedics;  Laterality: Right;  . RADIOACTIVE SEED GUIDED EXCISIONAL BREAST BIOPSY Left 12/15/2015   Procedure: LEFT  RADIOACTIVE SEED GUIDED EXCISIONAL BREAST BIOPSY;  Surgeon: [name], MD;  Location: [place];  Service: General;  Laterality: Left;  . WHIPPLE PROCEDURE N/A 08/07/2013   Procedure: WHIPPLE PROCEDURE;  Surgeon: [name], MD;  Location: [place];  Service: General;  Laterality: N/A;    There were no vitals filed for this visit.      Subjective Assessment - 10/17/16 1305    Subjective reports she's doing well, c/o some back pain x 1 month (first time mentioning to PT).  Hasn't discussed with [name] yet.  Ankle is doing well, pain is resolved. Weaning from brace-off at home, on when out.  No pain with ADLs or grocery shopping in ankle.   Pertinent History depression, glaucoma, HTN, neuropathy, hx CVA, arthritis, tardive dyskinesia   How long can you walk comfortably?  "couple hours"-walking around grocery store with cart   Patient Stated Goals return to square dancing   Currently in Pain? No/denies            [facility] PT Assessment - 10/17/16 1322      Figure 8 Edema   Figure 8 - Right  46.5 cm   Figure 8 - Left  47.8 cm                     [facility] Adult PT Treatment/Exercise - 10/17/16 1322      Ambulation/Gait   Ambulation/Gait Assistance 5: Supervision   Ambulation Distance (Feet) 350 Feet   Assistive device Straight cane   Ambulation Surface Level;Outdoor;Indoor;Paved   Gait Comments amb on paved outdoor surfaces including ramps; supervision needed for ramp     Ankle Exercises: Aerobic   Stationary Bike L2 x 6 min             Balance Exercises - 10/17/16 1351      Balance Exercises: Standing   Standing Eyes Opened Foam/compliant surface;Wide (BOA);Head turns   Standing Eyes Closed Wide (BOA);Foam/compliant surface;5 reps;10 secs;Solid surface;Head turns           PT Education - 10/17/16 1352    Education provided Yes   Education Details balance HEP; instructed to bring old HEP as pt questioning which ones to continue v/s d/c   Person(s) Educated Patient   Methods Explanation;Demonstration;Handout   Comprehension Verbalized understanding          PT Short Term Goals - 10/17/16 1332      PT SHORT TERM GOAL #1   Title verbalize understanding of fall prevention strategies to decrease risk of reinjury (10/13/16)   Status Achieved     PT SHORT TERM GOAL #2   Title improve BERG balance score to >/= 46/56 for decreased fall risk (10/13/16)   Status Achieved     PT SHORT TERM GOAL #3   Title improve Lt ankle swelling to </= 0.5 cm of Rt ankle figure-8 measurements for improved motion and mobility (10/13/16)   Baseline 10/17/16: 1.3 cm difference   Status On-going     PT SHORT TERM GOAL #4   Title report no pain with ADLs or grocery shopping for improved function (10/13/16)   Baseline 10/17/16: no pain with  ADLs   Status Achieved           PT Long Term Goals - 10/17/16 1353      PT LONG TERM GOAL #1   Title independent with HEP (11/10/16)   Status On-going     PT LONG TERM GOAL #2   Title improve BERG  balance score to >/= 48/56 for improved balance (11/10/16)   Status Achieved     PT LONG TERM GOAL #3   Title improve LLE strength to at least 4/5 for improved function and in order to return to square dancing (11/11/16)   Status On-going     PT LONG TERM GOAL #4   Title improve timed up and go to < 12 sec for improved mobility and decreased fall risk (11/11/16)   Status On-going     PT LONG TERM GOAL #5   Title perform 5x STS in < 13 sec for improved functional strength (11/11/16)   Status On-going           Plan - 10/17/16 1353    Clinical Impression Statement Pt tolerated ambulation on paved outdoor surfaces well today without ASO, and feel she is safe to begin community ambulation without brace as long as it is a familiar environment and minimal distractions.  Pt verbalized understanding.  Initiated balance HEP today with intermittent LOB on compliant surfaces.  Will continue to benefit from PT to maximize function.  Pt also concerned about LBP feeling like "it's the same when I had my cancer."  Advised pt to follow up with MD regarding pain.   PT Treatment/Interventions ADLs/Self Care Home Management;Cryotherapy;Electrical Stimulation;Moist Heat;Ultrasound;Neuromuscular re-education;Balance training;Therapeutic exercise;Therapeutic activities;Functional mobility training;Stair training;Gait training;Patient/family education;Manual techniques;Passive range of motion;Vasopneumatic Device;Taping;Dry needling   PT Next Visit Plan gastroc stretching, balance, strengthening, gait, edema management and modalities PRN   PT Home Exercise Plan standing hip ABD   Consulted and Agree with Plan of Care Patient      Patient will benefit from skilled therapeutic intervention in order to  improve the following deficits and impairments:  Abnormal gait, Decreased balance, Decreased mobility, Difficulty walking, Decreased range of motion, Impaired flexibility, Pain, Increased edema, Decreased strength, Decreased knowledge of use of DME  Visit Diagnosis: Pain in left ankle and joints of left foot  Unsteadiness on feet  Muscle weakness (generalized)  Stiffness of left ankle, not elsewhere classified  Other abnormalities of gait and mobility     Problem List Patient Active Problem List   Diagnosis Date Noted  . Pigmented villonodular synovitis of knee, left 03/22/2016  . Abnormal radionuclide bone scan 08/16/2015  . Ankle fracture, bimalleolar, closed 08/16/2015  . HLD (hyperlipidemia) 07/28/2015  . Fatty liver 09/09/2014  . Lumbago 07/07/2014  . Abnormality of gait 03/27/2014  . Memory loss 01/08/2014  . 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 Carcinoma of head of pancreas ([place]) 07/01/2013  . 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 Paresthesia of foot 06/06/2012  . 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 Colon polyps 06/05/2008  . Osteoarthritis 06/04/2008  . Chronic diarrhea 04/22/2008  . Hypothyroidism 02/26/2007  . Adjustment disorder with mixed anxiety and depressed mood 02/26/2007  . Unspecified glaucoma 02/26/2007  . Essential hypertension 02/26/2007  . Mitral valve disease 02/26/2007  . Allergic rhinitis 02/26/2007  . GERD 02/26/2007  . Hiatal hernia 02/26/2007  . Osteoporosis 02/26/2007      Laureen Abrahams, PT, DPT 10/17/16 1:59 PM    [facility] [place], [place], [address] Phone: [phone]   Fax:  [phone]  Name: [name] MRN: [id number] Date of Birth: [birth year]

## 2016-10-20 ENCOUNTER — Ambulatory Visit (INDEPENDENT_AMBULATORY_CARE_PROVIDER_SITE_OTHER): Payer: Medicare Other | Admitting: Physical Therapy

## 2016-10-20 DIAGNOSIS — R2681 Unsteadiness on feet: Secondary | ICD-10-CM | POA: Diagnosis not present

## 2016-10-20 DIAGNOSIS — M25572 Pain in left ankle and joints of left foot: Secondary | ICD-10-CM | POA: Diagnosis not present

## 2016-10-20 DIAGNOSIS — R2689 Other abnormalities of gait and mobility: Secondary | ICD-10-CM

## 2016-10-20 DIAGNOSIS — M25672 Stiffness of left ankle, not elsewhere classified: Secondary | ICD-10-CM | POA: Diagnosis not present

## 2016-10-20 DIAGNOSIS — M6281 Muscle weakness (generalized): Secondary | ICD-10-CM | POA: Diagnosis not present

## 2016-10-20 NOTE — Therapy (Signed)
[facility] [address] [place], [place], [address] Phone: [phone]   Fax:  [phone]  Physical Therapy Treatment  Patient Details  Name: [name] MRN: [id number] Date of Birth: Feb 17, 1941 Referring Provider: Dr. Briscoe Deutscher  Encounter Date: 10/20/2016      PT End of Session - 10/20/16 1100    Visit Number 11   Number of Visits 16   Date for PT Re-Evaluation 11/10/16   PT Start Time 7425   PT Stop Time 1058   PT Time Calculation (min) 43 min   Equipment Utilized During Treatment Gait belt   Activity Tolerance Patient tolerated treatment well   Behavior During Therapy [facility] for tasks assessed/performed      Past Medical History:  Diagnosis Date  . Allergic rhinitis   . Cancer ([place]) 07/10/13   Pancreatic cancer with MRI scan 06-19-13  . Chronic maxillary sinusitis    neti pot  . Depression    alone a lot  . Eustachian tube dysfunction   . GERD (gastroesophageal reflux disease)   . Glaucoma   . Heart murmur    hx. "MVP" -predental antibiotics  . Hiatal hernia   . Hypertension   . Hypothyroid   . Memory loss    short term memory loss  . Mitral valve prolapse    antibiotics before dental procedures  . Neuropathy   . Osteoarthritis   . Stroke [facility])    mini storkes left leg paraylsis. patient denies weakness 01/08/14.   . Tardive dyskinesia    possibly reglan, vitamin E helps  . Urgency of urination    some UTI in past    Past Surgical History:  Procedure Laterality Date  . 1 baker cyst removed    . ABDOMINAL HYSTERECTOMY     including ovaries  . BACK SURGERY     fusion  . BLEPHAROPLASTY Bilateral    with cataract surgery  . BREAST EXCISIONAL BIOPSY     left x2  . BREAST SURGERY     Biopsy left 2 times  . COLONOSCOPY W/ POLYPECTOMY     2004 last colonoscopy, no polyps  . DILATION AND CURETTAGE OF UTERUS     x3  . ESOPHAGOGASTRODUODENOSCOPY N/A 09/11/2013   Procedure: ESOPHAGOGASTRODUODENOSCOPY  (EGD);  Surgeon: Cleotis Nipper, MD;  [facility] ENDOSCOPY;  Service: Endoscopy;  Laterality: N/A;  Moderate sedation okay if MAC not available  . EUS N/A 07/10/2013   Procedure: ESOPHAGEAL ENDOSCOPIC ULTRASOUND (EUS) RADIAL;  Surgeon: Arta Silence, MD;  Location: [facility] ENDOSCOPY;  Service: Endoscopy;  Laterality: N/A;  . EYE SURGERY Right    cataract  . FINE NEEDLE ASPIRATION N/A 07/10/2013   Procedure: FINE NEEDLE ASPIRATION (FNA) LINEAR;  Surgeon: Arta Silence, MD;  Location: [facility] ENDOSCOPY;  Service: Endoscopy;  Laterality: N/A;  possible fna  . JOINT REPLACEMENT     LTKA  . KNEE SURGERY Left    x 5, total knee Left knee  . LAPAROSCOPY N/A 08/07/2013   Procedure: LAPAROSCOPY DIAGNOSTIC;  Surgeon: [name], MD;  Location: [place];  Service: General;  Laterality: N/A;  . LUMBAR SPINE SURGERY     x2  . LUMBAR SPINE SURGERY     cyst  . ORIF ANKLE FRACTURE Right 08/16/2015   Procedure: OPEN REDUCTION INTERNAL FIXATION (ORIF) ANKLE FRACTURE;  Surgeon: Meredith Pel, MD;  Location: [place];  Service: Orthopedics;  Laterality: Right;  . RADIOACTIVE SEED GUIDED EXCISIONAL BREAST BIOPSY Left 12/15/2015   Procedure: LEFT  RADIOACTIVE SEED GUIDED EXCISIONAL BREAST BIOPSY;  Surgeon: [name], MD;  Location: [place];  Service: General;  Laterality: Left;  . WHIPPLE PROCEDURE N/A 08/07/2013   Procedure: WHIPPLE PROCEDURE;  Surgeon: [name], MD;  Location: [place];  Service: General;  Laterality: N/A;    There were no vitals filed for this visit.      Subjective Assessment - 10/20/16 1016    Subjective still wearing brace during the day.  reports a little ache in medial ankle today but otherwise doing well.   Pertinent History depression, glaucoma, HTN, neuropathy, hx CVA, arthritis, tardive dyskinesia   How long can you stand comfortably? "couple hours"   How long can you walk comfortably? "couple hours"-walking around grocery store with cart   Patient Stated Goals return to square  dancing   Currently in Pain? No/denies   Pain Score 1    Pain Location Ankle   Pain Orientation Left;Medial   Pain Descriptors / Indicators Aching   Pain Type Chronic pain   Pain Onset 1 to 4 weeks ago   Pain Frequency Intermittent   Aggravating Factors  walking   Pain Relieving Factors rest, elevation                         [facility] Adult PT Treatment/Exercise - 10/20/16 1024      Ambulation/Gait   Gait Comments pt forgot cane today and noted decreased balance and pt frequently reaching for UE support.  Recommended pt use cane at all times to decrease fall risk.     Ankle Exercises: Aerobic   Stationary Bike L1 x 6 min             Balance Exercises - 10/20/16 1033      Balance Exercises: Standing   Standing Eyes Opened Foam/compliant surface;Wide (BOA);Head turns   Standing Eyes Closed Wide (BOA);Foam/compliant surface;5 reps;10 secs;Solid surface;Head turns   Stepping Strategy Anterior;Posterior;Lateral;10 reps  alternating bil with min A   Other Standing Exercises SLS activities with min A: single tap/double tap to cones; weaving around cones facing same direction with cues to increase step length - 1 LOB with min A to correct           PT Education - 10/20/16 1059    Education provided Yes   Education Details need to use cane at all times   Person(s) Educated Patient   Methods Explanation   Comprehension Verbalized understanding          PT Short Term Goals - 10/17/16 1332      PT SHORT TERM GOAL #1   Title verbalize understanding of fall prevention strategies to decrease risk of reinjury (10/13/16)   Status Achieved     PT SHORT TERM GOAL #2   Title improve BERG balance score to >/= 46/56 for decreased fall risk (10/13/16)   Status Achieved     PT SHORT TERM GOAL #3   Title improve Lt ankle swelling to </= 0.5 cm of Rt ankle figure-8 measurements for improved motion and mobility (10/13/16)   Baseline 10/17/16: 1.3 cm difference    Status On-going     PT SHORT TERM GOAL #4   Title report no pain with ADLs or grocery shopping for improved function (10/13/16)   Baseline 10/17/16: no pain with ADLs   Status Achieved           PT Long Term Goals - 10/17/16 1353      PT LONG TERM  GOAL #1   Title independent with HEP (11/10/16)   Status On-going     PT LONG TERM GOAL #2   Title improve BERG balance score to >/= 48/56 for improved balance (11/10/16)   Status Achieved     PT LONG TERM GOAL #3   Title improve LLE strength to at least 4/5 for improved function and in order to return to square dancing (11/11/16)   Status On-going     PT LONG TERM GOAL #4   Title improve timed up and go to < 12 sec for improved mobility and decreased fall risk (11/11/16)   Status On-going     PT LONG TERM GOAL #5   Title perform 5x STS in < 13 sec for improved functional strength (11/11/16)   Status On-going               Plan - 10/20/16 1116    Clinical Impression Statement Pt continues to demonstrate decreased stability with balance activities needing up to mod A for LOB.  Continue to reinforce and recommend pt use her cane at all times for safety as she is frequently reaching for UE support when not using cane.  Feel cane will likely need to be used long term due to neuropathy and associated balance deficits with this.  Will continue to benefit from PT to maximize function and safe mobility.   PT Treatment/Interventions ADLs/Self Care Home Management;Cryotherapy;Electrical Stimulation;Moist Heat;Ultrasound;Neuromuscular re-education;Balance training;Therapeutic exercise;Therapeutic activities;Functional mobility training;Stair training;Gait training;Patient/family education;Manual techniques;Passive range of motion;Vasopneumatic Device;Taping;Dry needling   PT Next Visit Plan continue balance exercises, ankle pain/ROM PRN, modalities PRN   Consulted and Agree with Plan of Care Patient      Patient will benefit from skilled  therapeutic intervention in order to improve the following deficits and impairments:  Abnormal gait, Decreased balance, Decreased mobility, Difficulty walking, Decreased range of motion, Impaired flexibility, Pain, Increased edema, Decreased strength, Decreased knowledge of use of DME  Visit Diagnosis: Pain in left ankle and joints of left foot  Unsteadiness on feet  Muscle weakness (generalized)  Stiffness of left ankle, not elsewhere classified  Other abnormalities of gait and mobility     Problem List Patient Active Problem List   Diagnosis Date Noted  . Pigmented villonodular synovitis of knee, left 03/22/2016  . Abnormal radionuclide bone scan 08/16/2015  . Ankle fracture, bimalleolar, closed 08/16/2015  . HLD (hyperlipidemia) 07/28/2015  . Fatty liver 09/09/2014  . Lumbago 07/07/2014  . Abnormality of gait 03/27/2014  . Memory loss 01/08/2014  . 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 Carcinoma of head of pancreas ([place]) 07/01/2013  . 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 Paresthesia of foot 06/06/2012  . 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 Colon polyps 06/05/2008  . Osteoarthritis 06/04/2008  . Chronic diarrhea 04/22/2008  . Hypothyroidism 02/26/2007  . Adjustment disorder with mixed anxiety and depressed mood 02/26/2007  . Unspecified glaucoma 02/26/2007  . Essential hypertension 02/26/2007  . Mitral valve disease 02/26/2007  . Allergic rhinitis 02/26/2007  . GERD 02/26/2007  . Hiatal hernia 02/26/2007  . Osteoporosis 02/26/2007      Laureen Abrahams, PT, DPT 10/20/16 11:25 AM    [facility] [place], [place], [address] Phone: [phone]   Fax:  [phone]  Name: [name] MRN: [id number] Date of Birth: June 06, 1940

## 2016-10-23 ENCOUNTER — Other Ambulatory Visit: Payer: Self-pay | Admitting: Neurology

## 2016-10-23 ENCOUNTER — Other Ambulatory Visit: Payer: Self-pay | Admitting: Cardiovascular Disease

## 2016-10-25 ENCOUNTER — Encounter: Payer: Self-pay | Admitting: *Deleted

## 2016-10-25 NOTE — Telephone Encounter (Signed)
Refill Request.  

## 2016-10-25 NOTE — Progress Notes (Signed)
Faxed printed/signed rx lyrica 300mg  to pt pharmacy. Fax: [phone]. Received confirmation.

## 2016-10-26 ENCOUNTER — Encounter: Payer: Self-pay | Admitting: Family Medicine

## 2016-10-26 ENCOUNTER — Ambulatory Visit (INDEPENDENT_AMBULATORY_CARE_PROVIDER_SITE_OTHER): Payer: Medicare Other

## 2016-10-26 ENCOUNTER — Ambulatory Visit (INDEPENDENT_AMBULATORY_CARE_PROVIDER_SITE_OTHER): Payer: Medicare Other | Admitting: Family Medicine

## 2016-10-26 ENCOUNTER — Ambulatory Visit (INDEPENDENT_AMBULATORY_CARE_PROVIDER_SITE_OTHER): Payer: Medicare Other | Admitting: Physical Therapy

## 2016-10-26 VITALS — BP 124/82 | HR 69 | Temp 97.9°F | Ht 61.0 in | Wt 150.8 lb

## 2016-10-26 DIAGNOSIS — M810 Age-related osteoporosis without current pathological fracture: Secondary | ICD-10-CM

## 2016-10-26 DIAGNOSIS — M25551 Pain in right hip: Secondary | ICD-10-CM | POA: Diagnosis not present

## 2016-10-26 DIAGNOSIS — R2689 Other abnormalities of gait and mobility: Secondary | ICD-10-CM

## 2016-10-26 DIAGNOSIS — M25572 Pain in left ankle and joints of left foot: Secondary | ICD-10-CM | POA: Diagnosis not present

## 2016-10-26 DIAGNOSIS — M549 Dorsalgia, unspecified: Secondary | ICD-10-CM

## 2016-10-26 DIAGNOSIS — M6281 Muscle weakness (generalized): Secondary | ICD-10-CM | POA: Diagnosis not present

## 2016-10-26 DIAGNOSIS — G8929 Other chronic pain: Secondary | ICD-10-CM | POA: Diagnosis not present

## 2016-10-26 DIAGNOSIS — M542 Cervicalgia: Secondary | ICD-10-CM

## 2016-10-26 DIAGNOSIS — R300 Dysuria: Secondary | ICD-10-CM | POA: Diagnosis not present

## 2016-10-26 DIAGNOSIS — R7989 Other specified abnormal findings of blood chemistry: Secondary | ICD-10-CM

## 2016-10-26 DIAGNOSIS — G4486 Cervicogenic headache: Secondary | ICD-10-CM

## 2016-10-26 DIAGNOSIS — R51 Headache: Secondary | ICD-10-CM | POA: Diagnosis not present

## 2016-10-26 DIAGNOSIS — R2681 Unsteadiness on feet: Secondary | ICD-10-CM | POA: Diagnosis not present

## 2016-10-26 DIAGNOSIS — E559 Vitamin D deficiency, unspecified: Secondary | ICD-10-CM | POA: Diagnosis not present

## 2016-10-26 DIAGNOSIS — M545 Other chronic pain: Secondary | ICD-10-CM

## 2016-10-26 DIAGNOSIS — M47812 Spondylosis without myelopathy or radiculopathy, cervical region: Secondary | ICD-10-CM | POA: Diagnosis not present

## 2016-10-26 DIAGNOSIS — M25672 Stiffness of left ankle, not elsewhere classified: Secondary | ICD-10-CM

## 2016-10-26 LAB — COMPREHENSIVE METABOLIC PANEL
ALT: 23 U/L (ref 0–35)
AST: 21 U/L (ref 0–37)
Albumin: 4.5 g/dL (ref 3.5–5.2)
Alkaline Phosphatase: 114 U/L (ref 39–117)
BUN: 18 mg/dL (ref 6–23)
CO2: 33 mEq/L — ABNORMAL HIGH (ref 19–32)
Calcium: 9.2 mg/dL (ref 8.4–10.5)
Chloride: 102 mEq/L (ref 96–112)
Creatinine, Ser: 0.77 mg/dL (ref 0.40–1.20)
GFR: 77.23 mL/min (ref >60.00)
Glucose, Bld: 81 mg/dL (ref 70–99)
Potassium: 4.5 mEq/L (ref 3.5–5.1)
Sodium: 138 mEq/L (ref 135–145)
Total Bilirubin: 0.6 mg/dL (ref 0.2–1.2)
Total Protein: 7.1 g/dL (ref 6.0–8.3)

## 2016-10-26 LAB — POCT URINALYSIS DIPSTICK
Bilirubin, UA: NEGATIVE
Blood, UA: NEGATIVE
Glucose, UA: NEGATIVE
Ketones, UA: NEGATIVE
Nitrite, UA: NEGATIVE
Protein, UA: NEGATIVE
Spec Grav, UA: <=1.005 — AB (ref 1.010–1.025)
Urobilinogen, UA: 0.2 E.U./dL
pH, UA: 6 (ref 5.0–8.0)

## 2016-10-26 LAB — CBC WITH DIFFERENTIAL/PLATELET
Basophils Absolute: 0 10*3/uL (ref 0.0–0.1)
Basophils Relative: 0.5 % (ref 0.0–3.0)
Eosinophils Absolute: 0.2 10*3/uL (ref 0.0–0.7)
Eosinophils Relative: 2.7 % (ref 0.0–5.0)
HCT: 41.3 % (ref 36.0–46.0)
Hemoglobin: 14.2 g/dL (ref 12.0–15.0)
Lymphocytes Relative: 17.4 % (ref 12.0–46.0)
Lymphs Abs: 1.4 10*3/uL (ref 0.7–4.0)
MCHC: 34.4 g/dL (ref 30.0–36.0)
MCV: 94.8 fl (ref 78.0–100.0)
Monocytes Absolute: 0.5 10*3/uL (ref 0.1–1.0)
Monocytes Relative: 6.8 % (ref 3.0–12.0)
Neutro Abs: 5.7 10*3/uL (ref 1.4–7.7)
Neutrophils Relative %: 72.6 % (ref 43.0–77.0)
Platelets: 144 10*3/uL — ABNORMAL LOW (ref 150.0–400.0)
RBC: 4.36 Mil/uL (ref 3.87–5.11)
RDW: 14.5 % (ref 11.5–15.5)
WBC: 7.9 10*3/uL (ref 4.0–10.5)

## 2016-10-26 LAB — VITAMIN D 25 HYDROXY (VIT D DEFICIENCY, FRACTURES): VITD: 24.57 ng/mL — ABNORMAL LOW (ref 30.00–100.00)

## 2016-10-26 LAB — MAGNESIUM: Magnesium: 2.3 mg/dL (ref 1.5–2.5)

## 2016-10-26 MED ORDER — NAPROXEN 500 MG PO TABS: 500.0000 mg | ORAL_TABLET | Freq: Two times a day (BID) | ORAL | 0 refills | Status: DC

## 2016-10-26 MED ORDER — NITROFURANTOIN MONOHYD MACRO 100 MG PO CAPS: 100.0000 mg | ORAL_CAPSULE | Freq: Two times a day (BID) | ORAL | 0 refills | Status: DC

## 2016-10-26 NOTE — Progress Notes (Addendum)
[name] is a 76 y.o. female is here for follow up.  History of Present Illness:   [name] CMA acting as scribe for [name].  HPI Patient comes in today with concerns of back pain and headaches. She has had the headaches for over a month that is all over the head. She has not been seen by neurology for this.  Headache Patient presents for evaluation of headache. Symptoms began about several weeks ago. Generally, the headaches last about a few days and occur once per week. The headaches do not seem to be related to any time of the day. The headaches are usually dull and are located in the back of the neck and wrap to the forehead.  The patient rates her most severe headaches a 5 on a scale from 1 to 10. Recently, the headaches have been increasing in frequency. Activities are not affected by the headaches. Precipitating factors include: stress. The headaches are usually not preceded by an aura. The patient denies depression, dizziness, loss of balance, muscle weakness, numbness of extremities, speech difficulties and vision problems. Home treatment has included acetaminophen and ibuprofen and resting with some improvement. Other history includes: allergic rhinitis.   Arthritis The patient has a long history of arthritis - in the neck, right hip, and low back. All have been worsening over the past year. Worse in the morning. Better with movement and stretching. NSAIDs sometimes help. No new symptoms. No trauma.   Osteoporosis The BMD measured at [facility] L1-L2 is 0.773 g/cm2 with a T-score of -3.3. This patient is considered osteoporotic according to [facility]) criteria.   Urinary Frequency With mild incontinence, mild dysuria over the last several days. No fever, N/V/D/C, abdominal pain.  Health Maintenance Due  Topic Date Due  . TETANUS/TDAP  08/29/1959   PMHx, SurgHx, SocialHx, FamHx, Medications, and Allergies were reviewed in the Visit Navigator  and updated as appropriate.   Patient Active Problem List   Diagnosis Date Noted  . Pigmented villonodular synovitis of knee, left 03/22/2016  . Abnormal radionuclide bone scan 08/16/2015  . Ankle fracture, bimalleolar, closed 08/16/2015  . HLD (hyperlipidemia) 07/28/2015  . Fatty liver 09/09/2014  . Lumbago 07/07/2014  . Abnormality of gait 03/27/2014  . Memory loss 01/08/2014  . 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 Paresthesia of foot 06/06/2012  . 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 Colon polyps 06/05/2008  . Osteoarthritis 06/04/2008  . Chronic diarrhea 04/22/2008  . Hypothyroidism 02/26/2007  . Adjustment disorder with mixed anxiety and depressed mood 02/26/2007  . Unspecified glaucoma 02/26/2007  . Essential hypertension 02/26/2007  . Mitral valve disease 02/26/2007  . Allergic rhinitis 02/26/2007  . GERD 02/26/2007  . Hiatal hernia 02/26/2007  . Osteoporosis 02/26/2007   Social History  Substance Use Topics  . Smoking status: Never Smoker  . Smokeless tobacco: Never Used  . Alcohol use No   Current Medications and Allergies:   .  aspirin 81 MG tablet, Take 81 mg by mouth daily., Disp: , Rfl:  .  atenolol (TENORMIN) 25 MG tablet, TAKE 1 TABLET BY MOUTH  EVERY MORNING, Disp: 90 tablet, Rfl: 1 .  busPIRone (BUSPAR) 15 MG tablet, Take 15 mg by mouth at bedtime., Disp: , Rfl:  .  Calcium Carbonate (CALCIUM 600 PO), Take 1 tablet by mouth daily., Disp: , Rfl:  .  carboxymethylcellulose (REFRESH TEARS) 0.5 %  SOLN, Place 1 drop into both eyes 2 (two) times daily. , Disp: , Rfl:  .  cholecalciferol (VITAMIN D) 1000 UNITS tablet, Take 1,000 Units by mouth 2 (two) times daily. , Disp: , Rfl:  .  cyanocobalamin 2000 MCG tablet, Take 2,000 mcg by mouth daily at 12 noon. , Disp: , Rfl:  .  docusate sodium (COLACE) 100 MG capsule, Take 200 mg by mouth at  bedtime., Disp: , Rfl:  .  DULoxetine (CYMBALTA) 30 MG capsule, Take 1 capsule (30 mg total) by mouth 2 (two) times daily., Disp: 180 capsule, Rfl: 3 .  feeding supplement (BOOST HIGH PROTEIN) LIQD, Take 1 Container by mouth daily., Disp: , Rfl:  .  FLUoxetine (PROZAC) 20 MG capsule, Take 1 capsule (20 mg total) by mouth daily., Disp: 90 capsule, Rfl: 2 .  fluticasone (FLONASE) 50 MCG/ACT nasal spray, Place 2 sprays into both nostrils daily., Disp: 16 g, Rfl: 6 .  furosemide (LASIX) 20 MG tablet, Take 1 tablet (20 mg total) by mouth daily., Disp: 90 tablet, Rfl: 3 .  guaiFENesin 200 MG tablet, Take 1 tablet (200 mg total) by mouth every 6 (six) hours as needed for cough or to loosen phlegm., Disp: 30 tablet, Rfl: 0 .  HYDROcodone-acetaminophen (NORCO/VICODIN) 5-325 MG tablet, Take 1-2 tablets by mouth every 6 (six) hours as needed for moderate pain., Disp: 30 tablet, Rfl: 0 .  ibuprofen (ADVIL,MOTRIN) 200 MG tablet, Take 200 mg by mouth every 6 (six) hours as needed for moderate pain., Disp: , Rfl:  .  latanoprost (XALATAN) 0.005 % ophthalmic solution, Place 1 drop into both eyes nightly., Disp: , Rfl:  .  levothyroxine (SYNTHROID, LEVOTHROID) 175 MCG tablet, TAKE 1 TABLET BY MOUTH  DAILY BEFORE BREAKFAST, Disp: 90 tablet, Rfl: 1 .  lipase/protease/amylase (CREON) 12000 UNITS CPEP capsule, Take 2 capsules (24,000 Units total) by mouth 3 (three) times daily before meals. (Patient taking differently: Take 12,000 Units by mouth 3 (three) times daily before meals. ), Disp: 270 capsule, Rfl: 5 .  LORazepam (ATIVAN) 0.5 MG tablet, Take 0.5 mg by mouth every 8 (eight) hours., Disp: , Rfl:  .  lovastatin (MEVACOR) 40 MG tablet, TAKE 1 TABLET BY MOUTH AT  BEDTIME, Disp: 90 tablet, Rfl: 1 .  LYRICA 300 MG capsule, TAKE ONE CAPSULE BY MOUTH TWICE A DAY, Disp: 180 capsule, Rfl: 0 .  Multiple Vitamin (MULTIVITAMIN WITH MINERALS) TABS tablet, Take 1 tablet by mouth daily., Disp: , Rfl:  .  omeprazole (PRILOSEC  OTC) 20 MG tablet, Take 20 mg by mouth 2 (two) times daily. , Disp: , Rfl:  .  polycarbophil (FIBERCON) 625 MG tablet, Take 625 mg by mouth daily. , Disp: , Rfl:  .  potassium chloride (KLOR-CON 10) 10 MEQ tablet, Take 1 tablet (10 mEq total) by mouth daily. PLEASE CONTACT OFFICE FOR ADDITIONAL REFILLS, Disp: 15 tablet, Rfl: 0 .  pregabalin (LYRICA) 100 MG capsule, Take 1 capsule (100 mg total) by mouth 2 (two) times daily., Disp: 60 capsule, Rfl: 3 .  promethazine (PHENERGAN) 25 MG tablet, Take 25 mg by mouth every 6 (six) hours as needed for nausea or vomiting., Disp: , Rfl:  .  psyllium (REGULOID) 0.52 g capsule, Take 0.52 g by mouth daily., Disp: , Rfl:  .  rOPINIRole (REQUIP) 0.5 MG tablet, Take 1 tablet (0.5 mg total) by mouth at bedtime., Disp: 30 tablet, Rfl: 6 .  simethicone (MYLICON) 793 MG chewable tablet, Chew 125 mg by mouth every 6 (six)  hours as needed for flatulence., Disp: , Rfl:  .  vitamin E 400 UNIT capsule, Take 800 Units by mouth 2 (two) times daily. For tartive dyskinesia, Disp: , Rfl:   Allergies  Allergen Reactions  . Other Other (See Comments)    According to patient Trilafor and Reglan cause Tardive Dyskinesia  . Metoclopramide Hcl Other (See Comments)    Shaking; caused tardive dyskinesia  . Niacin Other (See Comments)    shaking  . Trovan [Alatrofloxacin Mesylate] Other (See Comments)    Caused shaking and nervousness  . Benzocaine-Resorcinol Rash  . Celecoxib Other (See Comments)    unknown  . Erythromycin Base Rash  . Glucosamine Other (See Comments)    unknown  . Nortriptyline Other (See Comments)    Dizziness   . Phenazopyridine Hcl Other (See Comments)    unknown  . Sulfa Antibiotics Nausea And Vomiting and Rash  . Sulfonamide Derivatives Nausea And Vomiting and Rash   Review of Systems   Review of Systems  Constitutional: Positive for chills. Negative for fever and malaise/fatigue.  HENT: Negative for ear pain, sinus pain and sore throat.     Eyes: Positive for blurred vision and double vision.  Respiratory: Negative for cough, shortness of breath and wheezing.   Cardiovascular: Positive for leg swelling. Negative for chest pain and palpitations.  Gastrointestinal: Negative for abdominal pain, diarrhea and vomiting.  Musculoskeletal: Positive for back pain and neck pain. Negative for joint pain.  Neurological: Positive for headaches.  Psychiatric/Behavioral: Negative for depression, hallucinations and memory loss.   Vitals:   Vitals:   10/26/16 1003  BP: 124/82  Pulse: 69  Temp: 97.9 F (36.6 C)  TempSrc: Oral  SpO2: 97%  Weight: 150 lb 12.8 oz (68.4 kg)  Height: 5\' 1"  (1.549 m)     Body mass index is 28.49 kg/m.   Physical Exam:   Physical Exam  Constitutional: She appears well-developed and well-nourished. No distress.  HENT:  Head: Normocephalic and atraumatic.  Eyes: EOM are normal. Pupils are equal, round, and reactive to light.  Neck: Normal range of motion. Neck supple.  Cardiovascular: Normal rate, regular rhythm, normal heart sounds and intact distal pulses.   Pulmonary/Chest: Effort normal.  Abdominal: Soft.  Skin: Skin is warm.  Psychiatric: She has a normal mood and affect. Her behavior is normal.  Nursing note and vitals reviewed.    Results for orders placed or performed in visit on 10/26/16  Urine culture  Result Value Ref Range   Culture ESCHERICHIA COLI    Colony Count Greater than 100,000 CFU/mL    Organism ID, Bacteria ESCHERICHIA COLI       Susceptibility   Escherichia coli -  (no method available)    AMPICILLIN 8 Sensitive     AMOX/CLAVULANIC 4 Sensitive     AMPICILLIN/SULBACTAM 4 Sensitive     PIP/TAZO <=4 Sensitive     IMIPENEM <=0.25 Sensitive     CEFAZOLIN <=4 Not Reportable     CEFTRIAXONE <=1 Sensitive     CEFTAZIDIME <=1 Sensitive     CEFEPIME <=1 Sensitive     GENTAMICIN <=1 Sensitive     TOBRAMYCIN <=1 Sensitive     CIPROFLOXACIN <=0.25 Sensitive     LEVOFLOXACIN  <=0.12 Sensitive     NITROFURANTOIN <=16 Sensitive     TRIMETH/SULFA* <=20 Sensitive   CBC with Differential/Platelet  Result Value Ref Range   WBC 7.9 4.0 - 10.5 K/uL   RBC 4.36 3.87 - 5.11 Mil/uL  Hemoglobin 14.2 12.0 - 15.0 g/dL   HCT 41.3 36.0 - 46.0 %   MCV 94.8 78.0 - 100.0 fl   MCHC 34.4 30.0 - 36.0 g/dL   RDW 14.5 11.5 - 15.5 %   Platelets 144.0 (L) 150.0 - 400.0 K/uL   Neutrophils Relative % 72.6 43.0 - 77.0 %   Lymphocytes Relative 17.4 12.0 - 46.0 %   Monocytes Relative 6.8 3.0 - 12.0 %   Eosinophils Relative 2.7 0.0 - 5.0 %   Basophils Relative 0.5 0.0 - 3.0 %   Neutro Abs 5.7 1.4 - 7.7 K/uL   Lymphs Abs 1.4 0.7 - 4.0 K/uL   Monocytes Absolute 0.5 0.1 - 1.0 K/uL   Eosinophils Absolute 0.2 0.0 - 0.7 K/uL   Basophils Absolute 0.0 0.0 - 0.1 K/uL  Comprehensive metabolic panel  Result Value Ref Range   Sodium 138 135 - 145 mEq/L   Potassium 4.5 3.5 - 5.1 mEq/L   Chloride 102 96 - 112 mEq/L   CO2 33 (H) 19 - 32 mEq/L   Glucose, Bld 81 70 - 99 mg/dL   BUN 18 6 - 23 mg/dL   Creatinine, Ser 0.77 0.40 - 1.20 mg/dL   Total Bilirubin 0.6 0.2 - 1.2 mg/dL   Alkaline Phosphatase 114 39 - 117 U/L   AST 21 0 - 37 U/L   ALT 23 0 - 35 U/L   Total Protein 7.1 6.0 - 8.3 g/dL   Albumin 4.5 3.5 - 5.2 g/dL   Calcium 9.2 8.4 - 10.5 mg/dL   GFR 77.23 >60.00 mL/min  Magnesium  Result Value Ref Range   Magnesium 2.3 1.5 - 2.5 mg/dL  VITAMIN D 25 Hydroxy (Vit-D Deficiency, Fractures)  Result Value Ref Range   VITD 24.57 (L) 30.00 - 100.00 ng/mL  POCT urinalysis dipstick  Result Value Ref Range   Color, UA Yellow    Clarity, UA Clear    Glucose, UA Negative    Bilirubin, UA Negative    Ketones, UA Negative    Spec Grav, UA <=1.005 (A) 1.010 - 1.025   Blood, UA Negative    pH, UA 6.0 5.0 - 8.0   Protein, UA Negative    Urobilinogen, UA 0.2 0.2 or 1.0 E.U./dL   Nitrite, UA Negative    Leukocytes, UA Small (1+) (A) Negative   *Note: Due to a large number of results and/or  encounters for the requested time period, some results have not been displayed. A complete set of results can be found in Results Review.   EXAM: CERVICAL SPINE - 2-3 VIEW  COMPARISON:  Cervical spine CT 01/01/2015  FINDINGS: Evaluation the craniocervical junction is limited due to head tilt in the lateral projection. No fracture or bone erosion noted. Trace C6-7 anterolisthesis, also suggested on prior. C5-6 and C6-7 disc narrowing, advanced at C5-6. No prevertebral thickening. Mild facet spurring.  IMPRESSION: 1. No acute finding. 2. Focal C5-6 degenerative disc narrowing.  EXAM: DG HIP (WITH OR WITHOUT PELVIS) 2-3V RIGHT  COMPARISON:  None.  FINDINGS: Postsurgical changes are noted in the lower lumbar spine. The pelvic ring is intact. No acute fracture or dislocation is seen. No soft tissue abnormality is noted.  IMPRESSION: No acute abnormality seen.  EXAM: LUMBAR SPINE - 3 VIEW  COMPARISON:  03/09/2015  FINDINGS: Stable compression deformity of T10 is noted. Schmorl's nodes are noted in the superior endplate of L3 stable from the prior exam. Postsurgical changes are noted at L4-5 and L5-S1 also stable. No  acute bony abnormality is seen. No soft tissue changes are noted.  IMPRESSION: Chronic changes without acute abnormality.  Assessment and Plan:  [name] was seen today for follow-up.  Diagnoses and all orders for this visit:  Chronic midline low back pain without sciatica Comments: Chronic. Worsening. No acute abnormalities on exam or x-ray today. Orders: -     DG Lumbar Spine 2-3 Views; Future  Neck pain Comments: Cervical spine: Evaluation the craniocervical junction is limited due to head tilt in the lateral projection. No fracture or bone erosion noted. Trace C6-7 anterolisthesis, also suggested on prior. C5-6 and C6-7 disc narrowing, advanced at C5-6. No prevertebral thickening. Mild facet spurring. Orders: -     DG Cervical Spine 2 or 3  views; Future  Right hip pain Comments: Chronic. Worsening. No acute abnormalities on exam or x-ray today. Orders: -     DG HIP UNILAT W OR W/O PELVIS 2-3 VIEWS RIGHT; Future  Dysuria Comments: Patient was complaining of urinary frequency and admitted to some dysuria today. Urine was consistent with UTI. She was started on medication today. Urine culture was obtained and positive for Escherichia coli. Orders: -     POCT urinalysis dipstick -     Urine culture -     CBC with Differential/Platelet -     nitrofurantoin, macrocrystal-monohydrate, (MACROBID) 100 MG capsule; Take 1 capsule (100 mg total) by mouth 2 (two) times daily.  Osteoporosis, unspecified osteoporosis type, unspecified pathological fracture presence Comments: We did not have a chance to discuss this issue today though it was the or follow-up. Labs are pending. The patient does need to be on a medication for her osteoporosis.  Low vitamin D level -     VITAMIN D 25 Hydroxy (Vit-D Deficiency, Fractures)  Cervicogenic headache Comments: Patient's description of her headache is very consistent with cervicogenic headache. I believe that her cervical spine degenerative changes are leading to this pain. Orders: -     Comprehensive metabolic panel -     Magnesium -     naproxen (NAPROSYN) 500 MG tablet; Take 1 tablet (500 mg total) by mouth 2 (two) times daily with a meal.     . Reviewed expectations re: course of current medical issues. . Discussed self-management of symptoms. . Outlined signs and symptoms indicating need for more acute intervention. . Patient verbalized understanding and all questions were answered. Marland Kitchen Health Maintenance issues including appropriate healthy diet, exercise, and smoking avoidance were discussed with patient. . See orders for this visit as documented in the electronic medical record. . Patient received an After Visit Summary.  CMA served as Education administrator during this visit. History, Physical,  and Plan performed by medical provider. The above documentation has been reviewed and is accurate and complete. Briscoe Deutscher, D.O.  Records requested if needed. Time spent with the patient: 60 minutes, of which >50% was spent in obtaining information about her symptoms, reviewing her previous labs, evaluations, and treatments, counseling her about her condition (please see the discussed topics above), and developing a plan to further investigate it; she had a number of questions which I addressed.   Briscoe Deutscher, DO [place] [place] 10/28/2016  Future Appointments Date Time Provider [place]  10/31/2016 10:15 AM HORSE PEN SUB THERAPIST A [facility] None  11/03/2016 10:15 AM HORSE PEN SUB THERAPIST A [facility] None  11/09/2016 10:15 AM HORSE PEN SUB THERAPIST A [facility] None  11/10/2016 3:15 PM HORSE PEN SUB THERAPIST A [facility] None  11/28/2016 10:45 AM  Briscoe Deutscher, DO [facility] None  12/14/2016 1:20 PM Skeet Latch, MD [facility]  01/16/2017 10:30 AM Kathrynn Ducking, MD [facility] None  02/17/2017 11:00 AM [facility] ONLY [facility] None  02/17/2017 11:30 AM Ladell Pier, MD [facility] None  09/18/2017 9:00 AM Stephanie Acre, RN [facility] None

## 2016-10-26 NOTE — Therapy (Signed)
[facility] [address] [place], [place], [address] Phone: [phone]   Fax:  [phone]  Physical Therapy Treatment  Patient Details  Name: [name] MRN: [id number] Date of Birth: January 18, 1941 Referring Provider: Dr. Briscoe Deutscher  Encounter Date: 10/26/2016      PT End of Session - 10/26/16 1425    Visit Number 12   Number of Visits 16   Date for PT Re-Evaluation 11/10/16   PT Start Time 1340   PT Stop Time 1423   PT Time Calculation (min) 43 min   Equipment Utilized During Treatment Gait belt   Activity Tolerance Patient tolerated treatment well   Behavior During Therapy [facility] for tasks assessed/performed      Past Medical History:  Diagnosis Date  . Allergic rhinitis   . Cancer ([place]) 07/10/13   Pancreatic cancer with MRI scan 06-19-13  . Chronic maxillary sinusitis    neti pot  . Depression    alone a lot  . Eustachian tube dysfunction   . GERD (gastroesophageal reflux disease)   . Glaucoma   . Heart murmur    hx. "MVP" -predental antibiotics  . Hiatal hernia   . Hypertension   . Hypothyroid   . Memory loss    short term memory loss  . Mitral valve prolapse    antibiotics before dental procedures  . Neuropathy   . Osteoarthritis   . Stroke [facility])    mini storkes left leg paraylsis. patient denies weakness 01/08/14.   . Tardive dyskinesia    possibly reglan, vitamin E helps  . Urgency of urination    some UTI in past    Past Surgical History:  Procedure Laterality Date  . 1 baker cyst removed    . ABDOMINAL HYSTERECTOMY     including ovaries  . BACK SURGERY     fusion  . BLEPHAROPLASTY Bilateral    with cataract surgery  . BREAST EXCISIONAL BIOPSY     left x2  . BREAST SURGERY     Biopsy left 2 times  . COLONOSCOPY W/ POLYPECTOMY     2004 last colonoscopy, no polyps  . DILATION AND CURETTAGE OF UTERUS     x3  . ESOPHAGOGASTRODUODENOSCOPY N/A 09/11/2013   Procedure: ESOPHAGOGASTRODUODENOSCOPY  (EGD);  Surgeon: Cleotis Nipper, MD;  Location: [facility] ENDOSCOPY;  Service: Endoscopy;  Laterality: N/A;  Moderate sedation okay if MAC not available  . EUS N/A 07/10/2013   Procedure: ESOPHAGEAL ENDOSCOPIC ULTRASOUND (EUS) RADIAL;  Surgeon: Arta Silence, MD;  Location: [facility] ENDOSCOPY;  Service: Endoscopy;  Laterality: N/A;  . EYE SURGERY Right    cataract  . FINE NEEDLE ASPIRATION N/A 07/10/2013   Procedure: FINE NEEDLE ASPIRATION (FNA) LINEAR;  Surgeon: Arta Silence, MD;  Location: [facility] ENDOSCOPY;  Service: Endoscopy;  Laterality: N/A;  possible fna  . JOINT REPLACEMENT     LTKA  . KNEE SURGERY Left    x 5, total knee Left knee  . LAPAROSCOPY N/A 08/07/2013   Procedure: LAPAROSCOPY DIAGNOSTIC;  Surgeon: [name], MD;  Location: [facility];  Service: General;  Laterality: N/A;  . LUMBAR SPINE SURGERY     x2  . LUMBAR SPINE SURGERY     cyst  . ORIF ANKLE FRACTURE Right 08/16/2015   Procedure: OPEN REDUCTION INTERNAL FIXATION (ORIF) ANKLE FRACTURE;  Surgeon: Meredith Pel, MD;  Location: [place];  Service: Orthopedics;  Laterality: Right;  . RADIOACTIVE SEED GUIDED EXCISIONAL BREAST BIOPSY Left 12/15/2015   Procedure: LEFT  RADIOACTIVE SEED GUIDED EXCISIONAL BREAST BIOPSY;  Surgeon: [name], MD;  Location: [place];  Service: General;  Laterality: Left;  . WHIPPLE PROCEDURE N/A 08/07/2013   Procedure: WHIPPLE PROCEDURE;  Surgeon: [name], MD;  Location: [facility] [place];  Service: General;  Laterality: N/A;    There were no vitals filed for this visit.      Subjective Assessment - 10/26/16 1340    Subjective still wearing brace; only taking off when she's sitting or sleeping.     Pertinent History depression, glaucoma, HTN, neuropathy, hx CVA, arthritis, tardive dyskinesia   How long can you walk comfortably? "couple hours"-walking around grocery store with cart   Patient Stated Goals return to square dancing   Pain Score 0-No pain  had one episode 2/10 which quickly resolved with stepping  quickly to side   Pain Location Ankle   Pain Orientation Left   Pain Descriptors / Indicators Aching   Pain Type Chronic pain   Pain Onset 1 to 4 weeks ago   Pain Frequency Intermittent   Aggravating Factors  walking   Pain Relieving Factors rest, elevation                         [facility] Adult PT Treatment/Exercise - 10/26/16 1347      Ambulation/Gait   Ambulation/Gait Assistance 6: Modified independent (Device/Increase time)   Ambulation Distance (Feet) 250 Feet   Assistive device Straight cane   Ambulation Surface Level;Outdoor;Paved;Indoor   Ramp 6: Modified independent (Device)  with SPC   Gait Comments amb on paved outdoor surfaces negotiating ramps mod I with cane without ASO     Ankle Exercises: Standing   Heel Raises 20 reps  on foam   Toe Raise 20 reps  on foam   Other Standing Ankle Exercises simulated square dancing activities including do-see-do, figure-8s and circles around cones with min A   Other Standing Ankle Exercises sidestepping with red theraband 20' each direction with min A     Ankle Exercises: Seated   Other Seated Ankle Exercises sit to/from stand without UE support on compliant surface x 10 reps                  PT Short Term Goals - 10/17/16 1332      PT SHORT TERM GOAL #1   Title verbalize understanding of fall prevention strategies to decrease risk of reinjury (10/13/16)   Status Achieved     PT SHORT TERM GOAL #2   Title improve BERG balance score to >/= 46/56 for decreased fall risk (10/13/16)   Status Achieved     PT SHORT TERM GOAL #3   Title improve Lt ankle swelling to </= 0.5 cm of Rt ankle figure-8 measurements for improved motion and mobility (10/13/16)   Baseline 10/17/16: 1.3 cm difference   Status On-going     PT SHORT TERM GOAL #4   Title report no pain with ADLs or grocery shopping for improved function (10/13/16)   Baseline 10/17/16: no pain with ADLs   Status Achieved           PT Long Term  Goals - 10/17/16 1353      PT LONG TERM GOAL #1   Title independent with HEP (11/10/16)   Status On-going     PT LONG TERM GOAL #2   Title improve BERG balance score to >/= 48/56 for improved balance (11/10/16)   Status Achieved     PT LONG  TERM GOAL #3   Title improve LLE strength to at least 4/5 for improved function and in order to return to square dancing (11/11/16)   Status On-going     PT LONG TERM GOAL #4   Title improve timed up and go to < 12 sec for improved mobility and decreased fall risk (11/11/16)   Status On-going     PT LONG TERM GOAL #5   Title perform 5x STS in < 13 sec for improved functional strength (11/11/16)   Status On-going               Plan - 10/26/16 1426    Clinical Impression Statement Pt tolerated balance activities well without any ankle pain, and performed simulated square dancing activities for balance and in preparation to potentially return.  Pt reports she hasn't been but lists reasons unrelated to balance and PT at this time ("I don't have a partner.  I haven't been in so long.")  Feel pt may not return to square dancing but she continues to express desire to go, and currently needs min A to perform moves safely.  Will continue to benefit from PT to maximize function.   Clinical Impairments Affecting Rehab Potential multiple comorbidities   PT Treatment/Interventions ADLs/Self Care Home Management;Cryotherapy;Electrical Stimulation;Moist Heat;Ultrasound;Neuromuscular re-education;Balance training;Therapeutic exercise;Therapeutic activities;Functional mobility training;Stair training;Gait training;Patient/family education;Manual techniques;Passive range of motion;Vasopneumatic Device;Taping;Dry needling   PT Next Visit Plan continue balance exercises, ankle pain/ROM PRN, modalities PRN   Consulted and Agree with Plan of Care Patient      Patient will benefit from skilled therapeutic intervention in order to improve the following deficits and  impairments:  Abnormal gait, Decreased balance, Decreased mobility, Difficulty walking, Decreased range of motion, Impaired flexibility, Pain, Increased edema, Decreased strength, Decreased knowledge of use of DME  Visit Diagnosis: Pain in left ankle and joints of left foot  Unsteadiness on feet  Muscle weakness (generalized)  Stiffness of left ankle, not elsewhere classified  Other abnormalities of gait and mobility     Problem List Patient Active Problem List   Diagnosis Date Noted  . Pigmented villonodular synovitis of knee, left 03/22/2016  . Abnormal radionuclide bone scan 08/16/2015  . Ankle fracture, bimalleolar, closed 08/16/2015  . HLD (hyperlipidemia) 07/28/2015  . Fatty liver 09/09/2014  . Lumbago 07/07/2014  . Abnormality of gait 03/27/2014  . Memory loss 01/08/2014  . 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 Carcinoma of head of pancreas ([place]) 07/01/2013  . 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 Paresthesia of foot 06/06/2012  . 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 Colon polyps 06/05/2008  . Osteoarthritis 06/04/2008  . Chronic diarrhea 04/22/2008  . Hypothyroidism 02/26/2007  . Adjustment disorder with mixed anxiety and depressed mood 02/26/2007  . Unspecified glaucoma 02/26/2007  . Essential hypertension 02/26/2007  . Mitral valve disease 02/26/2007  . Allergic rhinitis 02/26/2007  . GERD 02/26/2007  . Hiatal hernia 02/26/2007  . Osteoporosis 02/26/2007      Laureen Abrahams, PT, DPT 10/26/16 2:29 PM    [place], [place], [address] Phone: [phone]   Fax:  [phone]  Name: [name] MRN: [id number] Date of Birth: [birth year]

## 2016-10-27 ENCOUNTER — Telehealth: Payer: Self-pay | Admitting: Cardiovascular Disease

## 2016-10-27 ENCOUNTER — Ambulatory Visit (INDEPENDENT_AMBULATORY_CARE_PROVIDER_SITE_OTHER): Payer: Medicare Other | Admitting: Physical Therapy

## 2016-10-27 DIAGNOSIS — M6281 Muscle weakness (generalized): Secondary | ICD-10-CM | POA: Diagnosis not present

## 2016-10-27 DIAGNOSIS — R2689 Other abnormalities of gait and mobility: Secondary | ICD-10-CM

## 2016-10-27 DIAGNOSIS — M25572 Pain in left ankle and joints of left foot: Secondary | ICD-10-CM | POA: Diagnosis not present

## 2016-10-27 DIAGNOSIS — R2681 Unsteadiness on feet: Secondary | ICD-10-CM

## 2016-10-27 DIAGNOSIS — M25672 Stiffness of left ankle, not elsewhere classified: Secondary | ICD-10-CM

## 2016-10-27 MED ORDER — POTASSIUM CHLORIDE ER 10 MEQ PO TBCR: 10.0000 meq | EXTENDED_RELEASE_TABLET | Freq: Every day | ORAL | 1 refills | Status: DC

## 2016-10-27 NOTE — Telephone Encounter (Signed)
Refill sent to the pharmacy electronically.  

## 2016-10-27 NOTE — Therapy (Signed)
[facility] [address] [place], [place], [address] Phone: [phone]   Fax:  [phone]  Physical Therapy Treatment  Patient Details  Name: LIZZET HENDLEY MRN: [id number] Date of Birth: [birth year] Referring Provider: Dr. Briscoe Deutscher  Encounter Date: 10/27/2016      PT End of Session - 10/27/16 1055    Visit Number 13   Number of Visits 16   Date for PT Re-Evaluation 11/10/16   PT Start Time 1601   PT Stop Time 1054   PT Time Calculation (min) 39 min   Activity Tolerance Patient tolerated treatment well   Behavior During Therapy [facility] for tasks assessed/performed      Past Medical History:  Diagnosis Date  . Allergic rhinitis   . Cancer ([place]) 07/10/13   Pancreatic cancer with MRI scan 06-19-13  . Chronic maxillary sinusitis    neti pot  . Depression    alone a lot  . Eustachian tube dysfunction   . GERD (gastroesophageal reflux disease)   . Glaucoma   . Heart murmur    hx. "MVP" -predental antibiotics  . Hiatal hernia   . Hypertension   . Hypothyroid   . Memory loss    short term memory loss  . Mitral valve prolapse    antibiotics before dental procedures  . Neuropathy   . Osteoarthritis   . Stroke [facility])    mini storkes left leg paraylsis. patient denies weakness 01/08/14.   . Tardive dyskinesia    possibly reglan, vitamin E helps  . Urgency of urination    some UTI in past    Past Surgical History:  Procedure Laterality Date  . 1 baker cyst removed    . ABDOMINAL HYSTERECTOMY     including ovaries  . BACK SURGERY     fusion  . BLEPHAROPLASTY Bilateral    with cataract surgery  . BREAST EXCISIONAL BIOPSY     left x2  . BREAST SURGERY     Biopsy left 2 times  . COLONOSCOPY W/ POLYPECTOMY     2004 last colonoscopy, no polyps  . DILATION AND CURETTAGE OF UTERUS     x3  . ESOPHAGOGASTRODUODENOSCOPY N/A 09/11/2013   Procedure: ESOPHAGOGASTRODUODENOSCOPY (EGD);  Surgeon: Cleotis Nipper, MD;  Location:  [facility] ENDOSCOPY;  Service: Endoscopy;  Laterality: N/A;  Moderate sedation okay if MAC not available  . EUS N/A 07/10/2013   Procedure: ESOPHAGEAL ENDOSCOPIC ULTRASOUND (EUS) RADIAL;  Surgeon: Arta Silence, MD;  Location: [facility] ENDOSCOPY;  Service: Endoscopy;  Laterality: N/A;  . EYE SURGERY Right    cataract  . FINE NEEDLE ASPIRATION N/A 07/10/2013   Procedure: FINE NEEDLE ASPIRATION (FNA) LINEAR;  Surgeon: Arta Silence, MD;  Location: [facility] ENDOSCOPY;  Service: Endoscopy;  Laterality: N/A;  possible fna  . JOINT REPLACEMENT     LTKA  . KNEE SURGERY Left    x 5, total knee Left knee  . LAPAROSCOPY N/A 08/07/2013   Procedure: LAPAROSCOPY DIAGNOSTIC;  Surgeon: [name], MD;  Location: [place];  Service: General;  Laterality: N/A;  . LUMBAR SPINE SURGERY     x2  . LUMBAR SPINE SURGERY     cyst  . ORIF ANKLE FRACTURE Right 08/16/2015   Procedure: OPEN REDUCTION INTERNAL FIXATION (ORIF) ANKLE FRACTURE;  Surgeon: Meredith Pel, MD;  Location: [place];  Service: Orthopedics;  Laterality: Right;  . RADIOACTIVE SEED GUIDED EXCISIONAL BREAST BIOPSY Left 12/15/2015   Procedure: LEFT RADIOACTIVE SEED GUIDED EXCISIONAL BREAST BIOPSY;  Surgeon:  [name], MD;  Location: [place];  Service: General;  Laterality: Left;  . WHIPPLE PROCEDURE N/A 08/07/2013   Procedure: WHIPPLE PROCEDURE;  Surgeon: [name], MD;  Location: [place];  Service: General;  Laterality: N/A;    There were no vitals filed for this visit.      Subjective Assessment - 10/27/16 1017    Subjective not wearing brace today; ankle feels good.  having some Rt hip/back pain today (had xrays yesterday, pain is chronic)   Pertinent History depression, glaucoma, HTN, neuropathy, hx CVA, arthritis, tardive dyskinesia   Patient Stated Goals return to square dancing   Currently in Pain? Yes   Pain Score 5    Pain Location Back   Pain Orientation Right   Pain Descriptors / Indicators Aching;Sore   Pain Type Chronic pain   Pain Onset 1 to 4  weeks ago   Pain Frequency Intermittent   Aggravating Factors  walking   Pain Relieving Factors rest, elevation                         [facility] Adult PT Treatment/Exercise - 10/27/16 1019      Exercises   Exercises Lumbar     Lumbar Exercises: Stretches   Passive Hamstring Stretch 3 reps;30 seconds   Passive Hamstring Stretch Limitations bil; seated   Single Knee to Chest Stretch 3 reps;30 seconds   Single Knee to Chest Stretch Limitations bil   Lower Trunk Rotation 3 reps;30 seconds   Lower Trunk Rotation Limitations bil   Piriformis Stretch 3 reps;30 seconds   Piriformis Stretch Limitations bil; seated     Lumbar Exercises: Aerobic   Stationary Bike L1 x 6 min     Lumbar Exercises: Supine   Ab Set 10 reps;5 seconds   Bridge 10 reps;5 seconds                  PT Short Term Goals - 10/17/16 1332      PT SHORT TERM GOAL #1   Title verbalize understanding of fall prevention strategies to decrease risk of reinjury (10/13/16)   Status Achieved     PT SHORT TERM GOAL #2   Title improve BERG balance score to >/= 46/56 for decreased fall risk (10/13/16)   Status Achieved     PT SHORT TERM GOAL #3   Title improve Lt ankle swelling to </= 0.5 cm of Rt ankle figure-8 measurements for improved motion and mobility (10/13/16)   Baseline 10/17/16: 1.3 cm difference   Status On-going     PT SHORT TERM GOAL #4   Title report no pain with ADLs or grocery shopping for improved function (10/13/16)   Baseline 10/17/16: no pain with ADLs   Status Achieved           PT Long Term Goals - 10/17/16 1353      PT LONG TERM GOAL #1   Title independent with HEP (11/10/16)   Status On-going     PT LONG TERM GOAL #2   Title improve BERG balance score to >/= 48/56 for improved balance (11/10/16)   Status Achieved     PT LONG TERM GOAL #3   Title improve LLE strength to at least 4/5 for improved function and in order to return to square dancing (11/11/16)   Status  On-going     PT LONG TERM GOAL #4   Title improve timed up and go to < 12 sec for improved mobility  and decreased fall risk (11/11/16)   Status On-going     PT LONG TERM GOAL #5   Title perform 5x STS in < 13 sec for improved functional strength (11/11/16)   Status On-going               Plan - 10/27/16 1056    Clinical Impression Statement Session today focused on stretching and strengthening core, hip and low back due to increased discomfort affecting mobility.  Pt reports no change in symptoms following session but feels she is able to walk better.  Will monitor and tx as indicated, and update HEP PRN.  Will continue to benefit from PT to maximize function.   PT Treatment/Interventions ADLs/Self Care Home Management;Cryotherapy;Electrical Stimulation;Moist Heat;Ultrasound;Neuromuscular re-education;Balance training;Therapeutic exercise;Therapeutic activities;Functional mobility training;Stair training;Gait training;Patient/family education;Manual techniques;Passive range of motion;Vasopneumatic Device;Taping;Dry needling   PT Next Visit Plan continue balance exercises, hip/knee/core strengthening, ankle pain/ROM PRN, modalities PRN   Consulted and Agree with Plan of Care Patient      Patient will benefit from skilled therapeutic intervention in order to improve the following deficits and impairments:  Abnormal gait, Decreased balance, Decreased mobility, Difficulty walking, Decreased range of motion, Impaired flexibility, Pain, Increased edema, Decreased strength, Decreased knowledge of use of DME  Visit Diagnosis: Pain in left ankle and joints of left foot  Unsteadiness on feet  Muscle weakness (generalized)  Stiffness of left ankle, not elsewhere classified  Other abnormalities of gait and mobility     Problem List Patient Active Problem List   Diagnosis Date Noted  . Pigmented villonodular synovitis of knee, left 03/22/2016  . Abnormal radionuclide bone scan  08/16/2015  . Ankle fracture, bimalleolar, closed 08/16/2015  . HLD (hyperlipidemia) 07/28/2015  . Fatty liver 09/09/2014  . Lumbago 07/07/2014  . Abnormality of gait 03/27/2014  . Memory loss 01/08/2014  . 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 Carcinoma of head of pancreas ([place]) 07/01/2013  . 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 Paresthesia of foot 06/06/2012  . 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 Colon polyps 06/05/2008  . Osteoarthritis 06/04/2008  . Chronic diarrhea 04/22/2008  . Hypothyroidism 02/26/2007  . Adjustment disorder with mixed anxiety and depressed mood 02/26/2007  . Unspecified glaucoma 02/26/2007  . Essential hypertension 02/26/2007  . Mitral valve disease 02/26/2007  . Allergic rhinitis 02/26/2007  . GERD 02/26/2007  . Hiatal hernia 02/26/2007  . Osteoporosis 02/26/2007      Laureen Abrahams, PT, DPT 10/27/16 10:58 AM    [facility] [place], [place], [address] Phone: [phone]   Fax:  [phone]  Name: AALIYAN BRINKMEIER MRN: [id number] Date of Birth: 07-15-1940

## 2016-10-27 NOTE — Telephone Encounter (Signed)
Follow up       Pt is at pharmacy waiting for refill on potassium

## 2016-10-27 NOTE — Telephone Encounter (Signed)
°*  STAT* If patient is at the pharmacy, call can be transferred to refill team.   1. Which medications need to be refilled? (please list name of each medication and dose if known) Potassium Chloride 10 MEQ  2. Which pharmacy/location (including street and city if local pharmacy) is medication to be sent to?[id number] - [place], [place]  3. Do they need a 30 day or 90 day supply?

## 2016-10-28 LAB — URINE CULTURE

## 2016-10-31 ENCOUNTER — Other Ambulatory Visit: Payer: Self-pay

## 2016-10-31 ENCOUNTER — Ambulatory Visit (INDEPENDENT_AMBULATORY_CARE_PROVIDER_SITE_OTHER): Payer: Medicare Other | Admitting: Physical Therapy

## 2016-10-31 DIAGNOSIS — M25572 Pain in left ankle and joints of left foot: Secondary | ICD-10-CM | POA: Diagnosis not present

## 2016-10-31 DIAGNOSIS — R2689 Other abnormalities of gait and mobility: Secondary | ICD-10-CM

## 2016-10-31 DIAGNOSIS — M6281 Muscle weakness (generalized): Secondary | ICD-10-CM

## 2016-10-31 DIAGNOSIS — R2681 Unsteadiness on feet: Secondary | ICD-10-CM | POA: Diagnosis not present

## 2016-10-31 DIAGNOSIS — M25672 Stiffness of left ankle, not elsewhere classified: Secondary | ICD-10-CM

## 2016-10-31 MED ORDER — VITAMIN D (ERGOCALCIFEROL) 1.25 MG (50000 UNIT) PO CAPS: 50000.0000 [IU] | ORAL_CAPSULE | ORAL | 0 refills | Status: DC

## 2016-10-31 NOTE — Therapy (Addendum)
[facility] [address] [place], [place], [address] Phone: [phone]   Fax:  [phone]  Physical Therapy Treatment  Patient Details  Name: [name] MRN: [id number] Date of Birth: 01-31-1941 Referring Provider: Dr. Briscoe Deutscher  Encounter Date: 10/31/2016      PT End of Session - 10/31/16 1056    Visit Number 14   Number of Visits 16   Date for PT Re-Evaluation 11/10/16   PT Start Time 8588   PT Stop Time 1055   PT Time Calculation (min) 40 min   Activity Tolerance Patient tolerated treatment well   Behavior During Therapy [facility] for tasks assessed/performed      Past Medical History:  Diagnosis Date  . Allergic rhinitis   . Cancer ([place]) 07/10/13   Pancreatic cancer with MRI scan 06-19-13  . Chronic maxillary sinusitis    neti pot  . Depression    alone a lot  . Eustachian tube dysfunction   . GERD (gastroesophageal reflux disease)   . Glaucoma   . Heart murmur    hx. "MVP" -predental antibiotics  . Hiatal hernia   . Hypertension   . Hypothyroid   . Memory loss    short term memory loss  . Mitral valve prolapse    antibiotics before dental procedures  . Neuropathy   . Osteoarthritis   . Stroke [facility])    mini storkes left leg paraylsis. patient denies weakness 01/08/14.   . Tardive dyskinesia    possibly reglan, vitamin E helps  . Urgency of urination    some UTI in past    Past Surgical History:  Procedure Laterality Date  . 1 baker cyst removed    . ABDOMINAL HYSTERECTOMY     including ovaries  . BACK SURGERY     fusion  . BLEPHAROPLASTY Bilateral    with cataract surgery  . BREAST EXCISIONAL BIOPSY     left x2  . BREAST SURGERY     Biopsy left 2 times  . COLONOSCOPY W/ POLYPECTOMY     2004 last colonoscopy, no polyps  . DILATION AND CURETTAGE OF UTERUS     x3  . ESOPHAGOGASTRODUODENOSCOPY N/A 09/11/2013   Procedure: ESOPHAGOGASTRODUODENOSCOPY (EGD);  Surgeon: Cleotis Nipper, MD;  Location: [facility]  ENDOSCOPY;  Service: Endoscopy;  Laterality: N/A;  Moderate sedation okay if MAC not available  . EUS N/A 07/10/2013   Procedure: ESOPHAGEAL ENDOSCOPIC ULTRASOUND (EUS) RADIAL;  Surgeon: Arta Silence, MD;  Location: [facility] ENDOSCOPY;  Service: Endoscopy;  Laterality: N/A;  . EYE SURGERY Right    cataract  . FINE NEEDLE ASPIRATION N/A 07/10/2013   Procedure: FINE NEEDLE ASPIRATION (FNA) LINEAR;  Surgeon: Arta Silence, MD;  Location: [facility] ENDOSCOPY;  Service: Endoscopy;  Laterality: N/A;  possible fna  . JOINT REPLACEMENT     LTKA  . KNEE SURGERY Left    x 5, total knee Left knee  . LAPAROSCOPY N/A 08/07/2013   Procedure: LAPAROSCOPY DIAGNOSTIC;  Surgeon: [name], MD;  Location: [place];  Service: General;  Laterality: N/A;  . LUMBAR SPINE SURGERY     x2  . LUMBAR SPINE SURGERY     cyst  . ORIF ANKLE FRACTURE Right 08/16/2015   Procedure: OPEN REDUCTION INTERNAL FIXATION (ORIF) ANKLE FRACTURE;  Surgeon: Meredith Pel, MD;  Location: [place];  Service: Orthopedics;  Laterality: Right;  . RADIOACTIVE SEED GUIDED EXCISIONAL BREAST BIOPSY Left 12/15/2015   Procedure: LEFT RADIOACTIVE SEED GUIDED EXCISIONAL BREAST BIOPSY;  Surgeon:  [name], MD;  Location: [place];  Service: General;  Laterality: Left;  . WHIPPLE PROCEDURE N/A 08/07/2013   Procedure: WHIPPLE PROCEDURE;  Surgeon: [name], MD;  Location: [place];  Service: General;  Laterality: N/A;    There were no vitals filed for this visit.      Subjective Assessment - 10/31/16 1018    Subjective ankle is fine; didn't wear brace at all over the weekend.    Pertinent History depression, glaucoma, HTN, neuropathy, hx CVA, arthritis, tardive dyskinesia   Patient Stated Goals return to square dancing   Currently in Pain? No/denies            [facility] PT Assessment - 10/31/16 0001      Figure 8 Edema   Figure 8 - Right  48.1 cm   Figure 8 - Left  47.8 cm                     [facility] Adult PT Treatment/Exercise -  10/31/16 1019      Lumbar Exercises: Stretches   Passive Hamstring Stretch 3 reps;30 seconds   Passive Hamstring Stretch Limitations bil; seated   Single Knee to Chest Stretch 3 reps;30 seconds   Single Knee to Chest Stretch Limitations bil   Lower Trunk Rotation 3 reps;30 seconds   Lower Trunk Rotation Limitations bil   Piriformis Stretch 3 reps;30 seconds   Piriformis Stretch Limitations bil; seated     Lumbar Exercises: Aerobic   Stationary Bike L1 x 6 min     Lumbar Exercises: Supine   Ab Set 10 reps;5 seconds   Bridge 10 reps;5 seconds                PT Education - 10/31/16 1056    Education provided Yes   Education Details HEP for LBP   Person(s) Educated Patient   Methods Explanation;Demonstration;Handout   Comprehension Verbalized understanding;Returned demonstration;Need further instruction          PT Short Term Goals - 10/31/16 1057      PT SHORT TERM GOAL #1   Title verbalize understanding of fall prevention strategies to decrease risk of reinjury (10/13/16)   Status Achieved     PT SHORT TERM GOAL #2   Title improve BERG balance score to >/= 46/56 for decreased fall risk (10/13/16)   Status Achieved     PT SHORT TERM GOAL #3   Title improve Lt ankle swelling to </= 0.5 cm of Rt ankle figure-8 measurements for improved motion and mobility (10/13/16)   Status Achieved     PT SHORT TERM GOAL #4   Title report no pain with ADLs or grocery shopping for improved function (10/13/16)   Status Achieved           PT Long Term Goals - 10/17/16 1353      PT LONG TERM GOAL #1   Title independent with HEP (11/10/16)   Status On-going     PT LONG TERM GOAL #2   Title improve BERG balance score to >/= 48/56 for improved balance (11/10/16)   Status Achieved     PT LONG TERM GOAL #3   Title improve LLE strength to at least 4/5 for improved function and in order to return to square dancing (11/11/16)   Status On-going     PT LONG TERM GOAL #4   Title  improve timed up and go to < 12 sec for improved mobility and decreased fall risk (11/11/16)  Status On-going     PT LONG TERM GOAL #5   Title perform 5x STS in < 13 sec for improved functional strength (11/11/16)   Status On-going       LATE ENTRY: G-code:     PT G-Codes  Functional Assessment Tool Used  BERG 49/56, clinical judgement   Functional Limitations Mobility: Walking and moving around       Mobility: Walking and Moving Around Goal Status [phone]) At least 1 percent but less than 20 percent impaired, limited or restricted   Mobility: Walking and Moving Around Discharge Status [phone]) At least 1 percent but less than 20 percent impaired, limited or restricted     Laureen Abrahams, PT, DPT 11/07/16 8:27 AM        Plan - 10/31/16 1057    Clinical Impression Statement Pt reported improvement in LBP following last session so issued HEP to help with pain and improve function.  Reports no falls and ankle swelling now decreased and without pain.  Anticipate pt will likely be ready for d/c in next 1-2 weeks.     PT Treatment/Interventions ADLs/Self Care Home Management   PT Next Visit Plan continue balance exercises, hip/knee/core strengthening, ankle pain/ROM PRN, review HEP   Consulted and Agree with Plan of Care Patient      Patient will benefit from skilled therapeutic intervention in order to improve the following deficits and impairments:  Abnormal gait, Decreased balance, Decreased mobility, Difficulty walking, Decreased range of motion, Impaired flexibility, Pain, Increased edema, Decreased strength, Decreased knowledge of use of DME  Visit Diagnosis: Pain in left ankle and joints of left foot  Unsteadiness on feet  Muscle weakness (generalized)  Stiffness of left ankle, not elsewhere classified  Other abnormalities of gait and mobility     Problem List Patient Active Problem List   Diagnosis Date Noted  . Pigmented villonodular synovitis of knee,  left 03/22/2016  . Abnormal radionuclide bone scan 08/16/2015  . Ankle fracture, bimalleolar, closed 08/16/2015  . HLD (hyperlipidemia) 07/28/2015  . Fatty liver 09/09/2014  . Lumbago 07/07/2014  . Abnormality of gait 03/27/2014  . Memory loss 01/08/2014  . 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 Carcinoma of head of pancreas ([place]) 07/01/2013  . 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 Paresthesia of foot 06/06/2012  . 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 Colon polyps 06/05/2008  . Osteoarthritis 06/04/2008  . Chronic diarrhea 04/22/2008  . Hypothyroidism 02/26/2007  . Adjustment disorder with mixed anxiety and depressed mood 02/26/2007  . Unspecified glaucoma 02/26/2007  . Essential hypertension 02/26/2007  . Mitral valve disease 02/26/2007  . Allergic rhinitis 02/26/2007  . GERD 02/26/2007  . Hiatal hernia 02/26/2007  . Osteoporosis 02/26/2007      Laureen Abrahams, PT, DPT 10/31/16 10:59 AM    [place], [place], [address] Phone: [phone]   Fax:  [phone]  Name: [name] MRN: [id number] Date of Birth: 12/04/1940       PHYSICAL THERAPY DISCHARGE SUMMARY  Visits from Start of Care: 14  Current functional level related to goals / functional outcomes: See above   Remaining deficits: See above; pt hospitalized and d/c to SNF   Education / Equipment: HEP  Plan: Patient agrees to discharge.  Patient goals were partially met. Patient is being discharged due to a change in medical status.  ?????        Laureen Abrahams, PT, DPT 11/07/16 8:28 AM

## 2016-10-31 NOTE — Patient Instructions (Signed)
Chair Sitting    Sit at edge of seat, spine straight, one leg extended. Put a hand on each thigh and bend forward from the hip, keeping spine straight. Allow hand on extended leg to reach toward toes. Support upper body with other arm. Hold _30__ seconds.  Repeat with other leg. Repeat _3__ times per session. Do __2-3_ sessions per day.   Piriformis Stretch, Sitting    Sit, one ankle on opposite knee, same-side hand on crossed knee. Push down on knee, keeping spine straight. Lean torso forward, with flat back, until tension is felt in hamstrings and gluteals of crossed-leg side. Hold __30_ seconds.  Repeat with other leg. Repeat _3__ times per session. Do _2-3__ sessions per day.        Knee to Chest (Flexion)    Pull knee toward chest. Feel stretch in lower back or buttock area. Breathing deeply, Hold __30__ seconds. Repeat with other knee. Repeat _3___ times. Do __2-3__ sessions per day.   Lower Trunk Rotation Stretch    Keeping back flat and feet together, rotate knees to left side. Hold __30__ seconds.  Repeat to other side. Repeat __3__ times per set. Do _1___ sets per session. Do __2-3__ sessions per day.    Pelvic Tilt (Flexion)    With feet flat and knees bent, flatten lower back into bed. Tighten stomach muscles. Hold _5___ seconds. Repeat _10___ times. Do _2-3___ sessions per day.         Bridging    Lie on back with feet shoulder width apart. Lift hips toward the ceiling. Hold __5__ seconds. Repeat __10__ times. Do __2-3__ sessions per day.

## 2016-11-01 ENCOUNTER — Emergency Department (HOSPITAL_COMMUNITY): Payer: Medicare Other | Admitting: Certified Registered Nurse Anesthetist

## 2016-11-01 ENCOUNTER — Encounter (HOSPITAL_COMMUNITY)
Admission: EM | Disposition: A | Discharge status: Skilled Nursing Facility | Payer: Self-pay | Source: Home / Self Care | Attending: Orthopedic Surgery

## 2016-11-01 ENCOUNTER — Emergency Department (HOSPITAL_COMMUNITY): Payer: Medicare Other

## 2016-11-01 ENCOUNTER — Inpatient Hospital Stay (HOSPITAL_COMMUNITY)
Admission: EM | Admit: 2016-11-01 | Discharge: 2016-11-05 | DRG: 512 | Disposition: A | Discharge status: Skilled Nursing Facility | Payer: Medicare Other | Attending: Orthopedic Surgery | Admitting: Orthopedic Surgery

## 2016-11-01 ENCOUNTER — Encounter (HOSPITAL_COMMUNITY): Payer: Self-pay | Admitting: Emergency Medicine

## 2016-11-01 DIAGNOSIS — Z96652 Presence of left artificial knee joint: Secondary | ICD-10-CM | POA: Diagnosis present

## 2016-11-01 DIAGNOSIS — Z888 Allergy status to other drugs, medicaments and biological substances status: Secondary | ICD-10-CM

## 2016-11-01 DIAGNOSIS — Z23 Encounter for immunization: Secondary | ICD-10-CM | POA: Diagnosis not present

## 2016-11-01 DIAGNOSIS — Z8507 Personal history of malignant neoplasm of pancreas: Secondary | ICD-10-CM | POA: Diagnosis not present

## 2016-11-01 DIAGNOSIS — E039 Hypothyroidism, unspecified: Secondary | ICD-10-CM | POA: Diagnosis present

## 2016-11-01 DIAGNOSIS — Z01818 Encounter for other preprocedural examination: Secondary | ICD-10-CM | POA: Diagnosis not present

## 2016-11-01 DIAGNOSIS — Z886 Allergy status to analgesic agent status: Secondary | ICD-10-CM

## 2016-11-01 DIAGNOSIS — S52611A Displaced fracture of right ulna styloid process, initial encounter for closed fracture: Secondary | ICD-10-CM | POA: Diagnosis not present

## 2016-11-01 DIAGNOSIS — Y9301 Activity, walking, marching and hiking: Secondary | ICD-10-CM | POA: Diagnosis present

## 2016-11-01 DIAGNOSIS — I341 Nonrheumatic mitral (valve) prolapse: Secondary | ICD-10-CM | POA: Diagnosis present

## 2016-11-01 DIAGNOSIS — S52501A Unspecified fracture of the lower end of right radius, initial encounter for closed fracture: Secondary | ICD-10-CM | POA: Diagnosis not present

## 2016-11-01 DIAGNOSIS — S62101B Fracture of unspecified carpal bone, right wrist, initial encounter for open fracture: Secondary | ICD-10-CM

## 2016-11-01 DIAGNOSIS — R600 Localized edema: Secondary | ICD-10-CM | POA: Diagnosis present

## 2016-11-01 DIAGNOSIS — S0990XA Unspecified injury of head, initial encounter: Secondary | ICD-10-CM | POA: Diagnosis not present

## 2016-11-01 DIAGNOSIS — Z881 Allergy status to other antibiotic agents status: Secondary | ICD-10-CM

## 2016-11-01 DIAGNOSIS — F419 Anxiety disorder, unspecified: Secondary | ICD-10-CM | POA: Diagnosis present

## 2016-11-01 DIAGNOSIS — T148XXA Other injury of unspecified body region, initial encounter: Secondary | ICD-10-CM | POA: Diagnosis not present

## 2016-11-01 DIAGNOSIS — M654 Radial styloid tenosynovitis [de Quervain]: Secondary | ICD-10-CM | POA: Diagnosis not present

## 2016-11-01 DIAGNOSIS — Z803 Family history of malignant neoplasm of breast: Secondary | ICD-10-CM | POA: Diagnosis not present

## 2016-11-01 DIAGNOSIS — S199XXA Unspecified injury of neck, initial encounter: Secondary | ICD-10-CM | POA: Diagnosis not present

## 2016-11-01 DIAGNOSIS — F329 Major depressive disorder, single episode, unspecified: Secondary | ICD-10-CM | POA: Diagnosis not present

## 2016-11-01 DIAGNOSIS — R9431 Abnormal electrocardiogram [ECG] [EKG]: Secondary | ICD-10-CM | POA: Diagnosis not present

## 2016-11-01 DIAGNOSIS — Z90411 Acquired partial absence of pancreas: Secondary | ICD-10-CM | POA: Diagnosis not present

## 2016-11-01 DIAGNOSIS — M810 Age-related osteoporosis without current pathological fracture: Secondary | ICD-10-CM | POA: Diagnosis present

## 2016-11-01 DIAGNOSIS — K8681 Exocrine pancreatic insufficiency: Secondary | ICD-10-CM | POA: Diagnosis present

## 2016-11-01 DIAGNOSIS — Z884 Allergy status to anesthetic agent status: Secondary | ICD-10-CM

## 2016-11-01 DIAGNOSIS — M542 Cervicalgia: Secondary | ICD-10-CM | POA: Diagnosis not present

## 2016-11-01 DIAGNOSIS — Z882 Allergy status to sulfonamides status: Secondary | ICD-10-CM

## 2016-11-01 DIAGNOSIS — S6991XA Unspecified injury of right wrist, hand and finger(s), initial encounter: Secondary | ICD-10-CM | POA: Diagnosis not present

## 2016-11-01 DIAGNOSIS — M25552 Pain in left hip: Secondary | ICD-10-CM | POA: Diagnosis not present

## 2016-11-01 DIAGNOSIS — M545 Low back pain: Secondary | ICD-10-CM | POA: Diagnosis not present

## 2016-11-01 DIAGNOSIS — M25551 Pain in right hip: Secondary | ICD-10-CM | POA: Diagnosis not present

## 2016-11-01 DIAGNOSIS — E785 Hyperlipidemia, unspecified: Secondary | ICD-10-CM | POA: Diagnosis not present

## 2016-11-01 DIAGNOSIS — S52531B Colles' fracture of right radius, initial encounter for open fracture type I or II: Secondary | ICD-10-CM | POA: Diagnosis present

## 2016-11-01 DIAGNOSIS — R410 Disorientation, unspecified: Secondary | ICD-10-CM

## 2016-11-01 DIAGNOSIS — S52501B Unspecified fracture of the lower end of right radius, initial encounter for open fracture type I or II: Secondary | ICD-10-CM | POA: Diagnosis not present

## 2016-11-01 DIAGNOSIS — S52591A Other fractures of lower end of right radius, initial encounter for closed fracture: Secondary | ICD-10-CM | POA: Diagnosis not present

## 2016-11-01 DIAGNOSIS — Y92481 Parking lot as the place of occurrence of the external cause: Secondary | ICD-10-CM

## 2016-11-01 DIAGNOSIS — I10 Essential (primary) hypertension: Secondary | ICD-10-CM | POA: Diagnosis present

## 2016-11-01 DIAGNOSIS — S52601B Unspecified fracture of lower end of right ulna, initial encounter for open fracture type I or II: Secondary | ICD-10-CM | POA: Diagnosis not present

## 2016-11-01 DIAGNOSIS — Z8673 Personal history of transient ischemic attack (TIA), and cerebral infarction without residual deficits: Secondary | ICD-10-CM

## 2016-11-01 DIAGNOSIS — S52391B Other fracture of shaft of radius, right arm, initial encounter for open fracture type I or II: Secondary | ICD-10-CM | POA: Diagnosis not present

## 2016-11-01 DIAGNOSIS — K5900 Constipation, unspecified: Secondary | ICD-10-CM | POA: Diagnosis not present

## 2016-11-01 HISTORY — PX: ORIF WRIST FRACTURE: SHX2133

## 2016-11-01 LAB — CBC WITH DIFFERENTIAL/PLATELET
Basophils Absolute: 0 10*3/uL (ref 0.0–0.1)
Basophils Relative: 0 %
EOS ABS: 0.2 10*3/uL (ref 0.0–0.7)
Eosinophils Relative: 3 %
HCT: 41.6 % (ref 36.0–46.0)
Hemoglobin: 13.9 g/dL (ref 12.0–15.0)
LYMPHS ABS: 1.2 10*3/uL (ref 0.7–4.0)
Lymphocytes Relative: 17 %
MCH: 32.3 pg (ref 26.0–34.0)
MCHC: 33.4 g/dL (ref 30.0–36.0)
MCV: 96.7 fL (ref 78.0–100.0)
MONO ABS: 0.6 10*3/uL (ref 0.1–1.0)
MONOS PCT: 8 %
Neutro Abs: 5.1 10*3/uL (ref 1.7–7.7)
Neutrophils Relative %: 72 %
PLATELETS: 106 10*3/uL — AB (ref 150–400)
RBC: 4.3 MIL/uL (ref 3.87–5.11)
RDW: 14.3 % (ref 11.5–15.5)
WBC: 7.1 10*3/uL (ref 4.0–10.5)

## 2016-11-01 LAB — BASIC METABOLIC PANEL
Anion gap: 11 (ref 5–15)
BUN: 28 mg/dL — AB (ref 6–20)
CALCIUM: 9.3 mg/dL (ref 8.9–10.3)
CO2: 27 mmol/L (ref 22–32)
CREATININE: 0.93 mg/dL (ref 0.44–1.00)
Chloride: 103 mmol/L (ref 101–111)
GFR calc Af Amer: >60 mL/min (ref >60)
GFR, EST NON AFRICAN AMERICAN: 58 mL/min — AB (ref >60)
Glucose, Bld: 95 mg/dL (ref 65–99)
POTASSIUM: 4.6 mmol/L (ref 3.5–5.1)
SODIUM: 141 mmol/L (ref 135–145)

## 2016-11-01 SURGERY — OPEN REDUCTION INTERNAL FIXATION (ORIF) WRIST FRACTURE
Anesthesia: General | Site: Wrist | Laterality: Right

## 2016-11-01 MED ORDER — FENTANYL CITRATE (PF) 100 MCG/2ML IJ SOLN
INTRAMUSCULAR | Status: AC
  Filled 2016-11-01: qty 2

## 2016-11-01 MED ORDER — CEFAZOLIN SODIUM-DEXTROSE 2-4 GM/100ML-% IV SOLN
INTRAVENOUS | Status: AC
  Filled 2016-11-01: qty 100

## 2016-11-01 MED ORDER — SODIUM CHLORIDE 0.9 % IR SOLN
Status: DC | PRN
  Administered 2016-11-01: 3000 mL

## 2016-11-01 MED ORDER — PROPOFOL 10 MG/ML IV BOLUS
INTRAVENOUS | Status: DC | PRN
  Administered 2016-11-01: 140 mg via INTRAVENOUS

## 2016-11-01 MED ORDER — PHENYLEPHRINE 40 MCG/ML (10ML) SYRINGE FOR IV PUSH (FOR BLOOD PRESSURE SUPPORT)
PREFILLED_SYRINGE | INTRAVENOUS | Status: AC
  Filled 2016-11-01: qty 10

## 2016-11-01 MED ORDER — FENTANYL CITRATE (PF) 100 MCG/2ML IJ SOLN
50.0000 ug | Freq: Once | INTRAMUSCULAR | Status: AC
  Administered 2016-11-01: 50 ug via INTRAVENOUS
  Filled 2016-11-01: qty 2

## 2016-11-01 MED ORDER — EPHEDRINE SULFATE-NACL 50-0.9 MG/10ML-% IV SOSY
PREFILLED_SYRINGE | INTRAVENOUS | Status: DC | PRN
  Administered 2016-11-01: 15 mg via INTRAVENOUS
  Administered 2016-11-01 (×2): 10 mg via INTRAVENOUS

## 2016-11-01 MED ORDER — ONDANSETRON HCL 4 MG/2ML IJ SOLN
INTRAMUSCULAR | Status: DC | PRN
  Administered 2016-11-01: 4 mg via INTRAVENOUS

## 2016-11-01 MED ORDER — MORPHINE SULFATE (PF) 2 MG/ML IV SOLN
4.0000 mg | Freq: Once | INTRAVENOUS | Status: AC
  Administered 2016-11-01: 4 mg via INTRAVENOUS
  Filled 2016-11-01: qty 2

## 2016-11-01 MED ORDER — BUPIVACAINE HCL (PF) 0.25 % IJ SOLN
INTRAMUSCULAR | Status: AC
  Filled 2016-11-01: qty 30

## 2016-11-01 MED ORDER — PHENYLEPHRINE 40 MCG/ML (10ML) SYRINGE FOR IV PUSH (FOR BLOOD PRESSURE SUPPORT)
PREFILLED_SYRINGE | INTRAVENOUS | Status: AC
  Filled 2016-11-01: qty 20

## 2016-11-01 MED ORDER — 0.9 % SODIUM CHLORIDE (POUR BTL) OPTIME
TOPICAL | Status: DC | PRN
  Administered 2016-11-01: 1000 mL

## 2016-11-01 MED ORDER — CEFAZOLIN SODIUM-DEXTROSE 1-4 GM/50ML-% IV SOLN
INTRAVENOUS | Status: DC | PRN
  Administered 2016-11-01: 1 g via INTRAVENOUS

## 2016-11-01 MED ORDER — LACTATED RINGERS IV SOLN
INTRAVENOUS | Status: DC | PRN
  Administered 2016-11-01 (×2): via INTRAVENOUS

## 2016-11-01 MED ORDER — LIDOCAINE 2% (20 MG/ML) 5 ML SYRINGE
INTRAMUSCULAR | Status: DC | PRN
  Administered 2016-11-01: 50 mg via INTRAVENOUS

## 2016-11-01 MED ORDER — TETANUS-DIPHTH-ACELL PERTUSSIS 5-2.5-18.5 LF-MCG/0.5 IM SUSP
0.5000 mL | Freq: Once | INTRAMUSCULAR | Status: AC
  Administered 2016-11-01: 0.5 mL via INTRAMUSCULAR
  Filled 2016-11-01: qty 0.5

## 2016-11-01 MED ORDER — BUPIVACAINE-EPINEPHRINE (PF) 0.5% -1:200000 IJ SOLN
INTRAMUSCULAR | Status: AC
  Filled 2016-11-01: qty 30

## 2016-11-01 MED ORDER — CEFAZOLIN SODIUM-DEXTROSE 1-4 GM/50ML-% IV SOLN
1.0000 g | Freq: Once | INTRAVENOUS | Status: AC
  Administered 2016-11-01: 1 g via INTRAVENOUS
  Filled 2016-11-01: qty 50

## 2016-11-01 MED ORDER — PHENYLEPHRINE 40 MCG/ML (10ML) SYRINGE FOR IV PUSH (FOR BLOOD PRESSURE SUPPORT)
PREFILLED_SYRINGE | INTRAVENOUS | Status: DC | PRN
  Administered 2016-11-01 (×11): 80 ug via INTRAVENOUS

## 2016-11-01 MED ORDER — FENTANYL CITRATE (PF) 100 MCG/2ML IJ SOLN
INTRAMUSCULAR | Status: DC | PRN
  Administered 2016-11-01: 100 ug via INTRAVENOUS

## 2016-11-01 MED ORDER — EPHEDRINE 5 MG/ML INJ
INTRAVENOUS | Status: AC
  Filled 2016-11-01: qty 10

## 2016-11-01 MED ORDER — PROPOFOL 10 MG/ML IV BOLUS
INTRAVENOUS | Status: AC
  Filled 2016-11-01: qty 20

## 2016-11-01 SURGICAL SUPPLY — 58 items
BAG ZIPLOCK 12X15 (MISCELLANEOUS) ×3 IMPLANT
BANDAGE ACE 4X5 VEL STRL LF (GAUZE/BANDAGES/DRESSINGS) ×6 IMPLANT
BIT DRILL 2.0 LNG QUCK RELEASE (BIT) ×1 IMPLANT
BIT DRILL 2.8 QUICK RELEASE (BIT) ×1 IMPLANT
BNDG GAUZE ELAST 4 BULKY (GAUZE/BANDAGES/DRESSINGS) ×6 IMPLANT
COVER SURGICAL LIGHT HANDLE (MISCELLANEOUS) ×3 IMPLANT
CUFF TOURN SGL QUICK 18 (TOURNIQUET CUFF) ×3 IMPLANT
DECANTER SPIKE VIAL GLASS SM (MISCELLANEOUS) IMPLANT
DRAPE OEC MINIVIEW 54X84 (DRAPES) ×3 IMPLANT
DRAPE U-SHAPE 47X51 STRL (DRAPES) IMPLANT
DRILL 2.0 LNG QUICK RELEASE (BIT) ×3
DRILL 2.8 QUICK RELEASE (BIT) ×3
DRSG PAD ABDOMINAL 8X10 ST (GAUZE/BANDAGES/DRESSINGS) ×3 IMPLANT
ELECT REM PT RETURN 15FT ADLT (MISCELLANEOUS) IMPLANT
EVACUATOR 1/8 PVC DRAIN (DRAIN) IMPLANT
GAUZE SPONGE 4X4 12PLY STRL (GAUZE/BANDAGES/DRESSINGS) ×3 IMPLANT
GAUZE SPONGE 4X4 16PLY XRAY LF (GAUZE/BANDAGES/DRESSINGS) ×3 IMPLANT
GAUZE XEROFORM 5X9 LF (GAUZE/BANDAGES/DRESSINGS) ×6 IMPLANT
GLOVE BIO SURGEON STRL SZ8 (GLOVE) IMPLANT
GOWN STRL REUS W/TWL XL LVL3 (GOWN DISPOSABLE) ×6 IMPLANT
IV NS IRRIG 3000ML ARTHROMATIC (IV SOLUTION) ×3 IMPLANT
KIT BASIN OR (CUSTOM PROCEDURE TRAY) ×3 IMPLANT
KWIRE 4.0 X .045IN (WIRE) IMPLANT
KWIRE 4.0 X .062IN (WIRE) IMPLANT
MANIFOLD NEPTUNE II (INSTRUMENTS) ×3 IMPLANT
NS IRRIG 1000ML POUR BTL (IV SOLUTION) ×3 IMPLANT
PACK ORTHO EXTREMITY (CUSTOM PROCEDURE TRAY) ×3 IMPLANT
PAD CAST 3X4 CTTN HI CHSV (CAST SUPPLIES) ×1 IMPLANT
PAD CAST 4YDX4 CTTN HI CHSV (CAST SUPPLIES) ×1 IMPLANT
PADDING CAST ABS 4INX4YD NS (CAST SUPPLIES) ×4
PADDING CAST ABS COTTON 4X4 ST (CAST SUPPLIES) ×2 IMPLANT
PADDING CAST COTTON 3X4 STRL (CAST SUPPLIES) ×2
PADDING CAST COTTON 4X4 STRL (CAST SUPPLIES) ×2
PLATE WRIST 171 SPAN SHORT HEX (Plate) ×1 IMPLANT
PLATE WRIST SPANNING SHORT (Plate) ×3 IMPLANT
POSITIONER SURGICAL ARM (MISCELLANEOUS) ×3 IMPLANT
SCREW LOCKING 2.7X10 (Screw) ×3 IMPLANT
SCREW NLCKG 2.7X10 HEXA (Screw) ×3 IMPLANT
SCREW NON LOCKING 2.7X10 (Screw) ×9 IMPLANT
SCREW NONLOCK 2.7X14 (Screw) ×3 IMPLANT
SCREW NONLOCK HEX 2.7X12 (Screw) ×3 IMPLANT
SCREW NONLOCK HEX 3.5X12 (Screw) ×12 IMPLANT
SPLINT FIBERGLASS 4X30 (CAST SUPPLIES) ×6 IMPLANT
SUT BONE WAX W31G (SUTURE) IMPLANT
SUT ETHILON 6 0 PS 3 18 (SUTURE) IMPLANT
SUT MERSILENE 4 0 P 3 (SUTURE) IMPLANT
SUT MNCRL AB 4-0 PS2 18 (SUTURE) IMPLANT
SUT PROLENE 3 0 PS 2 (SUTURE) IMPLANT
SUT PROLENE 4 0 P 3 18 (SUTURE) IMPLANT
SUT PROLENE 4 0 RB 1 (SUTURE)
SUT PROLENE 4-0 RB1 .5 CRCL 36 (SUTURE) IMPLANT
SUT VIC AB 0 CT1 27 (SUTURE)
SUT VIC AB 0 CT1 27XBRD ANTBC (SUTURE) IMPLANT
SUT VIC AB 2-0 CT1 27 (SUTURE)
SUT VIC AB 2-0 CT1 TAPERPNT 27 (SUTURE) IMPLANT
TOWEL OR 17X26 10 PK STRL BLUE (TOWEL DISPOSABLE) ×6 IMPLANT
UNDERPAD 30X30 (UNDERPADS AND DIAPERS) ×6 IMPLANT
WATER STERILE IRR 1500ML POUR (IV SOLUTION) IMPLANT
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
## 2016-11-01 NOTE — Anesthesia Procedure Notes (Signed)
Procedure Name: LMA Insertion Performed by: Madden Piazza J Pre-anesthesia Checklist: Patient identified, Emergency Drugs available, Suction available, Patient being monitored and Timeout performed Patient Re-evaluated:Patient Re-evaluated prior to inductionOxygen Delivery Method: Circle system utilized Preoxygenation: Pre-oxygenation with 100% oxygen Intubation Type: IV induction Ventilation: Mask ventilation without difficulty LMA: LMA inserted LMA Size: 4.0 Number of attempts: 1 Placement Confirmation: positive ETCO2,  CO2 detector and breath sounds checked- equal and bilateral Tube secured with: Tape Dental Injury: Teeth and Oropharynx as per pre-operative assessment        

## 2016-11-01 NOTE — ED Notes (Signed)
Bed: BW38 Expected date:  Expected time:  Means of arrival:  Comments: EMS-fall/dislocation

## 2016-11-01 NOTE — ED Notes (Signed)
Patient transported to X-ray 

## 2016-11-01 NOTE — ED Notes (Signed)
Patient transported to OR.
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
## 2016-11-01 NOTE — Anesthesia Preprocedure Evaluation (Signed)
Anesthesia Evaluation  Patient identified by MRN, date of birth, ID band Patient awake    Reviewed: Allergy & Precautions, NPO status , Patient's Chart, lab work & pertinent test results  Airway Mallampati: II  TM Distance: >3 FB Neck ROM: Full    Dental no notable dental hx.    Pulmonary neg pulmonary ROS,    Pulmonary exam normal breath sounds clear to auscultation       Cardiovascular hypertension, Normal cardiovascular exam Rhythm:Regular Rate:Normal     Neuro/Psych CVA negative psych ROS   GI/Hepatic negative GI ROS, Neg liver ROS,   Endo/Other  negative endocrine ROSHypothyroidism   Renal/GU negative Renal ROS  negative genitourinary   Musculoskeletal negative musculoskeletal ROS (+)   Abdominal   Peds negative pediatric ROS (+)  Hematology negative hematology ROS (+)   Anesthesia Other Findings   Reproductive/Obstetrics negative OB ROS                             Anesthesia Physical Anesthesia Plan  ASA: II  Anesthesia Plan: General   Post-op Pain Management:    Induction: Intravenous  PONV Risk Score and Plan: 3 and Ondansetron, Dexamethasone, Propofol and Treatment may vary due to age  Airway Management Planned: LMA  Additional Equipment:   Intra-op Plan:   Post-operative Plan: Extubation in OR  Informed Consent: I have reviewed the patients History and Physical, chart, labs and discussed the procedure including the risks, benefits and alternatives for the proposed anesthesia with the patient or authorized representative who has indicated his/her understanding and acceptance.   Dental advisory given  Plan Discussed with: CRNA and Surgeon  Anesthesia Plan Comments: (Npo since 1100)        Anesthesia Quick Evaluation

## 2016-11-02 ENCOUNTER — Telehealth: Payer: Self-pay | Admitting: Family Medicine

## 2016-11-02 ENCOUNTER — Encounter (HOSPITAL_COMMUNITY): Payer: Self-pay | Admitting: Internal Medicine

## 2016-11-02 DIAGNOSIS — Z96652 Presence of left artificial knee joint: Secondary | ICD-10-CM | POA: Diagnosis present

## 2016-11-02 DIAGNOSIS — Z8673 Personal history of transient ischemic attack (TIA), and cerebral infarction without residual deficits: Secondary | ICD-10-CM | POA: Diagnosis not present

## 2016-11-02 DIAGNOSIS — Z4789 Encounter for other orthopedic aftercare: Secondary | ICD-10-CM | POA: Diagnosis not present

## 2016-11-02 DIAGNOSIS — S52531B Colles' fracture of right radius, initial encounter for open fracture type I or II: Secondary | ICD-10-CM | POA: Diagnosis not present

## 2016-11-02 DIAGNOSIS — R413 Other amnesia: Secondary | ICD-10-CM | POA: Diagnosis not present

## 2016-11-02 DIAGNOSIS — G609 Hereditary and idiopathic neuropathy, unspecified: Secondary | ICD-10-CM | POA: Diagnosis not present

## 2016-11-02 DIAGNOSIS — R2689 Other abnormalities of gait and mobility: Secondary | ICD-10-CM | POA: Diagnosis not present

## 2016-11-02 DIAGNOSIS — F329 Major depressive disorder, single episode, unspecified: Secondary | ICD-10-CM | POA: Diagnosis present

## 2016-11-02 DIAGNOSIS — Y92481 Parking lot as the place of occurrence of the external cause: Secondary | ICD-10-CM | POA: Diagnosis not present

## 2016-11-02 DIAGNOSIS — R29898 Other symptoms and signs involving the musculoskeletal system: Secondary | ICD-10-CM | POA: Diagnosis not present

## 2016-11-02 DIAGNOSIS — M1990 Unspecified osteoarthritis, unspecified site: Secondary | ICD-10-CM | POA: Diagnosis not present

## 2016-11-02 DIAGNOSIS — I10 Essential (primary) hypertension: Secondary | ICD-10-CM | POA: Diagnosis not present

## 2016-11-02 DIAGNOSIS — E039 Hypothyroidism, unspecified: Secondary | ICD-10-CM

## 2016-11-02 DIAGNOSIS — R410 Disorientation, unspecified: Secondary | ICD-10-CM | POA: Diagnosis not present

## 2016-11-02 DIAGNOSIS — E785 Hyperlipidemia, unspecified: Secondary | ICD-10-CM | POA: Diagnosis present

## 2016-11-02 DIAGNOSIS — F419 Anxiety disorder, unspecified: Secondary | ICD-10-CM | POA: Diagnosis present

## 2016-11-02 DIAGNOSIS — Z886 Allergy status to analgesic agent status: Secondary | ICD-10-CM | POA: Diagnosis not present

## 2016-11-02 DIAGNOSIS — M810 Age-related osteoporosis without current pathological fracture: Secondary | ICD-10-CM | POA: Diagnosis present

## 2016-11-02 DIAGNOSIS — S52391B Other fracture of shaft of radius, right arm, initial encounter for open fracture type I or II: Secondary | ICD-10-CM | POA: Diagnosis not present

## 2016-11-02 DIAGNOSIS — K8681 Exocrine pancreatic insufficiency: Secondary | ICD-10-CM | POA: Diagnosis present

## 2016-11-02 DIAGNOSIS — Z23 Encounter for immunization: Secondary | ICD-10-CM | POA: Diagnosis not present

## 2016-11-02 DIAGNOSIS — Z90411 Acquired partial absence of pancreas: Secondary | ICD-10-CM | POA: Diagnosis not present

## 2016-11-02 DIAGNOSIS — R600 Localized edema: Secondary | ICD-10-CM | POA: Diagnosis present

## 2016-11-02 DIAGNOSIS — Z882 Allergy status to sulfonamides status: Secondary | ICD-10-CM | POA: Diagnosis not present

## 2016-11-02 DIAGNOSIS — Z803 Family history of malignant neoplasm of breast: Secondary | ICD-10-CM | POA: Diagnosis not present

## 2016-11-02 DIAGNOSIS — M654 Radial styloid tenosynovitis [de Quervain]: Secondary | ICD-10-CM | POA: Diagnosis not present

## 2016-11-02 DIAGNOSIS — Y9301 Activity, walking, marching and hiking: Secondary | ICD-10-CM | POA: Diagnosis present

## 2016-11-02 DIAGNOSIS — Z884 Allergy status to anesthetic agent status: Secondary | ICD-10-CM | POA: Diagnosis not present

## 2016-11-02 DIAGNOSIS — R41841 Cognitive communication deficit: Secondary | ICD-10-CM | POA: Diagnosis not present

## 2016-11-02 DIAGNOSIS — Z881 Allergy status to other antibiotic agents status: Secondary | ICD-10-CM | POA: Diagnosis not present

## 2016-11-02 DIAGNOSIS — Z888 Allergy status to other drugs, medicaments and biological substances status: Secondary | ICD-10-CM | POA: Diagnosis not present

## 2016-11-02 DIAGNOSIS — S52501B Unspecified fracture of the lower end of right radius, initial encounter for open fracture type I or II: Secondary | ICD-10-CM | POA: Diagnosis not present

## 2016-11-02 DIAGNOSIS — Z8507 Personal history of malignant neoplasm of pancreas: Secondary | ICD-10-CM | POA: Diagnosis not present

## 2016-11-02 DIAGNOSIS — I341 Nonrheumatic mitral (valve) prolapse: Secondary | ICD-10-CM | POA: Diagnosis present

## 2016-11-02 DIAGNOSIS — K5900 Constipation, unspecified: Secondary | ICD-10-CM | POA: Diagnosis not present

## 2016-11-02 HISTORY — DX: Colles' fracture of right radius, initial encounter for open fracture type I or II: S52.531B

## 2016-11-02 MED ORDER — FENTANYL CITRATE (PF) 100 MCG/2ML IJ SOLN
25.0000 ug | INTRAMUSCULAR | Status: DC | PRN
  Administered 2016-11-02 (×2): 25 ug via INTRAVENOUS
  Administered 2016-11-02 (×2): 50 ug via INTRAVENOUS

## 2016-11-02 MED ORDER — DOCUSATE SODIUM 100 MG PO CAPS
200.0000 mg | ORAL_CAPSULE | Freq: Every day | ORAL | Status: DC
Start: 2016-11-02 — End: 2016-11-02
  Administered 2016-11-02: 200 mg via ORAL
  Filled 2016-11-02: qty 2

## 2016-11-02 MED ORDER — CEFAZOLIN SODIUM-DEXTROSE 1-4 GM/50ML-% IV SOLN
1.0000 g | Freq: Three times a day (TID) | INTRAVENOUS | Status: DC
  Administered 2016-11-02 – 2016-11-05 (×10): 1 g via INTRAVENOUS
  Filled 2016-11-02 (×12): qty 50

## 2016-11-02 MED ORDER — VITAMIN D (ERGOCALCIFEROL) 1.25 MG (50000 UNIT) PO CAPS
50000.0000 [IU] | ORAL_CAPSULE | ORAL | Status: DC
  Administered 2016-11-02: 50000 [IU] via ORAL
  Filled 2016-11-02: qty 1

## 2016-11-02 MED ORDER — PSYLLIUM 95 % PO PACK
1.0000 | PACK | Freq: Every day | ORAL | Status: DC
  Administered 2016-11-04 – 2016-11-05 (×2): 1 via ORAL
  Filled 2016-11-02 (×3): qty 1

## 2016-11-02 MED ORDER — PREGABALIN 100 MG PO CAPS
300.0000 mg | ORAL_CAPSULE | Freq: Two times a day (BID) | ORAL | Status: DC
  Administered 2016-11-02 – 2016-11-05 (×8): 300 mg via ORAL
  Filled 2016-11-02 (×8): qty 3

## 2016-11-02 MED ORDER — CALCIUM POLYCARBOPHIL 625 MG PO TABS
625.0000 mg | ORAL_TABLET | Freq: Every day | ORAL | Status: DC
  Administered 2016-11-02 – 2016-11-05 (×4): 625 mg via ORAL
  Filled 2016-11-02 (×4): qty 1

## 2016-11-02 MED ORDER — FENTANYL CITRATE (PF) 100 MCG/2ML IJ SOLN
INTRAMUSCULAR | Status: AC
  Filled 2016-11-02: qty 2

## 2016-11-02 MED ORDER — PANTOPRAZOLE SODIUM 40 MG PO TBEC
40.0000 mg | DELAYED_RELEASE_TABLET | Freq: Two times a day (BID) | ORAL | Status: DC
  Administered 2016-11-02 – 2016-11-05 (×7): 40 mg via ORAL
  Filled 2016-11-02 (×7): qty 1

## 2016-11-02 MED ORDER — LEVOTHYROXINE SODIUM 50 MCG PO TABS
175.0000 ug | ORAL_TABLET | Freq: Every day | ORAL | Status: DC
  Administered 2016-11-02 – 2016-11-05 (×4): 175 ug via ORAL
  Filled 2016-11-02 (×4): qty 1

## 2016-11-02 MED ORDER — PROMETHAZINE HCL 25 MG/ML IJ SOLN: 6.2500 mg | INTRAMUSCULAR | Status: DC | PRN

## 2016-11-02 MED ORDER — POTASSIUM CHLORIDE CRYS ER 10 MEQ PO TBCR
10.0000 meq | EXTENDED_RELEASE_TABLET | Freq: Every day | ORAL | Status: DC
  Administered 2016-11-02 – 2016-11-05 (×4): 10 meq via ORAL
  Filled 2016-11-02 (×4): qty 1

## 2016-11-02 MED ORDER — ONDANSETRON HCL 4 MG PO TABS: 4.0000 mg | ORAL_TABLET | Freq: Four times a day (QID) | ORAL | Status: DC | PRN

## 2016-11-02 MED ORDER — OXYCODONE HCL 5 MG PO TABS
5.0000 mg | ORAL_TABLET | ORAL | Status: DC | PRN
  Administered 2016-11-02: 5 mg via ORAL
  Administered 2016-11-02: 10 mg via ORAL
  Administered 2016-11-02: 5 mg via ORAL
  Administered 2016-11-02 – 2016-11-03 (×5): 10 mg via ORAL
  Filled 2016-11-02: qty 1
  Filled 2016-11-02 (×6): qty 2

## 2016-11-02 MED ORDER — NITROFURANTOIN MONOHYD MACRO 100 MG PO CAPS
100.0000 mg | ORAL_CAPSULE | Freq: Two times a day (BID) | ORAL | Status: DC
  Administered 2016-11-02 – 2016-11-05 (×8): 100 mg via ORAL
  Filled 2016-11-02 (×8): qty 1

## 2016-11-02 MED ORDER — PROMETHAZINE HCL 25 MG PO TABS: 25.0000 mg | ORAL_TABLET | Freq: Four times a day (QID) | ORAL | Status: DC | PRN

## 2016-11-02 MED ORDER — LORAZEPAM 0.5 MG PO TABS
0.5000 mg | ORAL_TABLET | Freq: Three times a day (TID) | ORAL | Status: DC
  Administered 2016-11-02 – 2016-11-03 (×4): 0.5 mg via ORAL
  Filled 2016-11-02 (×4): qty 1

## 2016-11-02 MED ORDER — ONDANSETRON HCL 4 MG/2ML IJ SOLN: 4.0000 mg | Freq: Four times a day (QID) | INTRAMUSCULAR | Status: DC | PRN

## 2016-11-02 MED ORDER — MORPHINE SULFATE (PF) 2 MG/ML IV SOLN
1.0000 mg | INTRAVENOUS | Status: DC | PRN
  Administered 2016-11-02 – 2016-11-03 (×6): 1 mg via INTRAVENOUS
  Filled 2016-11-02 (×6): qty 1

## 2016-11-02 MED ORDER — VITAMIN B-12 1000 MCG PO TABS
2000.0000 ug | ORAL_TABLET | Freq: Every day | ORAL | Status: DC
  Administered 2016-11-02 – 2016-11-05 (×4): 2000 ug via ORAL
  Filled 2016-11-02 (×4): qty 2

## 2016-11-02 MED ORDER — SIMETHICONE 80 MG PO CHEW
80.0000 mg | CHEWABLE_TABLET | Freq: Once | ORAL | Status: AC
Start: 2016-11-02 — End: 2016-11-02
  Administered 2016-11-02: 80 mg via ORAL
  Filled 2016-11-02: qty 1

## 2016-11-02 MED ORDER — CEFAZOLIN SODIUM-DEXTROSE 1-4 GM/50ML-% IV SOLN
1.0000 g | INTRAVENOUS | Status: AC
  Administered 2016-11-02: 1 g via INTRAVENOUS
  Filled 2016-11-02: qty 50

## 2016-11-02 MED ORDER — LACTATED RINGERS IV SOLN
INTRAVENOUS | Status: DC
  Administered 2016-11-02 – 2016-11-03 (×3): via INTRAVENOUS

## 2016-11-02 MED ORDER — FUROSEMIDE 20 MG PO TABS
20.0000 mg | ORAL_TABLET | Freq: Every day | ORAL | Status: DC
  Administered 2016-11-02 – 2016-11-05 (×4): 20 mg via ORAL
  Filled 2016-11-02 (×4): qty 1

## 2016-11-02 MED ORDER — OXYCODONE HCL 5 MG PO TABS
ORAL_TABLET | ORAL | Status: AC
  Filled 2016-11-02: qty 1

## 2016-11-02 MED ORDER — ALPRAZOLAM 0.5 MG PO TABS: 0.5000 mg | ORAL_TABLET | Freq: Four times a day (QID) | ORAL | Status: DC | PRN

## 2016-11-02 MED ORDER — ROPINIROLE HCL 0.5 MG PO TABS
0.5000 mg | ORAL_TABLET | Freq: Every day | ORAL | Status: DC
  Administered 2016-11-02 – 2016-11-04 (×4): 0.5 mg via ORAL
  Filled 2016-11-02 (×4): qty 1

## 2016-11-02 MED ORDER — LEVOTHYROXINE SODIUM 25 MCG PO TABS: 175.0000 ug | ORAL_TABLET | Freq: Every morning | ORAL | Status: DC

## 2016-11-02 MED ORDER — FLUTICASONE PROPIONATE 50 MCG/ACT NA SUSP
2.0000 | Freq: Every day | NASAL | Status: DC
  Administered 2016-11-02 – 2016-11-03 (×2): 2 via NASAL
  Filled 2016-11-02: qty 16

## 2016-11-02 MED ORDER — DULOXETINE HCL 30 MG PO CPEP
30.0000 mg | ORAL_CAPSULE | Freq: Two times a day (BID) | ORAL | Status: DC
  Administered 2016-11-02 – 2016-11-05 (×8): 30 mg via ORAL
  Filled 2016-11-02 (×8): qty 1

## 2016-11-02 MED ORDER — PROMETHAZINE HCL 25 MG RE SUPP: 12.5000 mg | Freq: Four times a day (QID) | RECTAL | Status: DC | PRN

## 2016-11-02 MED ORDER — SENNOSIDES-DOCUSATE SODIUM 8.6-50 MG PO TABS
1.0000 | ORAL_TABLET | Freq: Two times a day (BID) | ORAL | Status: DC
  Administered 2016-11-02 – 2016-11-05 (×7): 1 via ORAL
  Filled 2016-11-02 (×8): qty 1

## 2016-11-02 MED ORDER — ATENOLOL 25 MG PO TABS
25.0000 mg | ORAL_TABLET | Freq: Every morning | ORAL | Status: DC
  Administered 2016-11-02 – 2016-11-05 (×4): 25 mg via ORAL
  Filled 2016-11-02 (×4): qty 1

## 2016-11-02 MED ORDER — PANCRELIPASE (LIP-PROT-AMYL) 12000-38000 UNITS PO CPEP
12000.0000 [IU] | ORAL_CAPSULE | Freq: Three times a day (TID) | ORAL | Status: DC
  Administered 2016-11-02 – 2016-11-05 (×11): 12000 [IU] via ORAL
  Filled 2016-11-02 (×11): qty 1

## 2016-11-02 NOTE — Progress Notes (Signed)
OT Cancellation Note  Patient Details Name: [name] MRN: [id number] DOB: Feb 11, 1941   Cancelled Treatment:    Reason Eval/Treat Not Completed: Other (comment).  Pt just got back to bed and lunch has arrived. Will check back later today.  Woodrow Drab 11/02/2016, 1:18 PM  Lesle Chris, OTR/L [phone] 11/02/2016
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
## 2016-11-02 NOTE — Progress Notes (Signed)
Subjective: 1 Day Post-Op Procedure(s) (LRB): OPEN REDUCTION INTERNAL FIXATION (ORIF) RIGHT WRIST FRACTURE WITH APPLICATION OF SPANNING PLATE, IRRIGATION AND DEBRIDEMENT RIGHT WRIST (Right) Patient asleep upon entering the room. She denies nausea, vomiting, fever or chills. She has pain appropriate to the right upper extremity status post ORIF earlier this morning.  Objective: Vital signs in last 24 hours: Temp:  [97.6 F (36.4 C)-98.3 F (36.8 C)] 98.2 F (36.8 C) (06/06 0417) Pulse Rate:  [72-97] 78 (06/06 0600) Resp:  [9-19] 14 (06/06 0600) BP: (94-164)/(36-95) 111/54 (06/06 0600) SpO2:  [91 %-99 %] 96 % (06/06 0600) Weight:  [68 kg (150 lb)] 68 kg (150 lb) (06/05 1717)  Intake/Output from previous day: 06/05 0701 - 06/06 0700 In: 5643 [P.O.:240; I.V.:1000; IV Piggyback:50] Out: 25 [Blood:25] Intake/Output this shift: No intake/output data recorded.   Recent Labs  11/01/16 1750  HGB 13.9    Recent Labs  11/01/16 1750  WBC 7.1  RBC 4.30  HCT 41.6  PLT 106*    Recent Labs  11/01/16 1750  NA 141  K 4.6  CL 103  CO2 27  BUN 28*  CREATININE 0.93  GLUCOSE 95  CALCIUM 9.3   No results for input(s): LABPT, INR in the last 72 hours.  The patient is alert and oriented in no acute distress. The patient complains of pain in the affected upper extremity.  The patient is noted to have a normal HEENT exam. Lung fields show equal chest expansion and no shortness of breath. Abdomen exam is nontender without distention. Lower extremity examination does not show any fracture dislocation or blood clot symptoms. Pelvis is stable and the neck and back are stable and nontender. Examination of the right upper extremity shows that she appropriately is elevating the hand and digits, her sensation is grossly intact she has mild swelling of the digits present. Renal flexion and extension is intact actively with mild tenderness as well as passively. No signs of infection, no signs  of compartment syndrome are present.  Assessment/Plan: 1 Day Post-Op Procedure(s) (LRB): OPEN REDUCTION INTERNAL FIXATION (ORIF) RIGHT WRIST FRACTURE WITH APPLICATION OF SPANNING PLATE, IRRIGATION AND DEBRIDEMENT RIGHT WRIST (Right) I have discussed with the patient a continuation of elevation, edema control and attempted range of motion of the digits today. We will continue IV antibiotics os observation. Physical therapy and occupational therapy will be instituted for range of motion, elevation, edema control and activities of daily living assistance. We greatly appreciate the hospitalist and assistance with this nice lady. All questions were encouraged and answered.  Alizabeth Antonio L 11/02/2016, 7:15 AM
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
## 2016-11-02 NOTE — Care Management Obs Status (Addendum)
[place] NOTIFICATION   Patient Details  Name: CECELY RENGEL MRN: [id number] Date of Birth: Dec 15, 1940   Medicare Observation Status Notification Given:     Patient unable to sign due to wrist fracture. Copy left with patient.  Lynnell Catalan, RN 11/02/2016, 1:25 PM
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
## 2016-11-02 NOTE — Progress Notes (Signed)
Patient seen and examined at bedside. TRH consulted for management of chronic medical conditions. Please note that Dr. Hal Hope has seen patient after midnight, refer to his full consult note. We will continue to follow patient along. Vital signs are stable this morning, blood work reviewed, also stable. Please note that patient reported constipation this morning, we'll place on bowel regimen.  Faye Ramsay, MD  [facility] Pager [phone]  If 7PM-7AM, please contact night-coverage [email or web address] Password [email or web address]

## 2016-11-02 NOTE — Op Note (Signed)
Dictation [phone] please see full dictation  Status post ORIF type I open fracture right wrist with associated ORIF and spanning wrist plate placement  We have consulted the hospitalist for medical management given her age history of pancreatic cancer etc.  We'll keep a close eye on her- all went quite well night.  Kersten Salmons M.D.

## 2016-11-02 NOTE — Transfer of Care (Signed)
Immediate Anesthesia Transfer of Care Note  Patient: [name]  Procedure(s) Performed: Procedure(s): OPEN REDUCTION INTERNAL FIXATION (ORIF) RIGHT WRIST FRACTURE WITH APPLICATION OF SPANNING PLATE, IRRIGATION AND DEBRIDEMENT RIGHT WRIST (Right)  Patient Location: PACU  Anesthesia Type:General  Level of Consciousness: sedated, patient cooperative and responds to stimulation  Airway & Oxygen Therapy: Patient Spontanous Breathing and Patient connected to face mask oxygen  Post-op Assessment: Report given to RN and Post -op Vital signs reviewed and stable  Post vital signs: Reviewed and stable  Last Vitals:  Vitals:   11/01/16 1904 11/01/16 2122  BP: (!) 161/81 (!) 150/81  Pulse: 72 80  Resp: 17 15  Temp:      Last Pain:  Vitals:   11/01/16 2107  TempSrc:   PainSc: 7          Complications: No apparent anesthesia complications

## 2016-11-02 NOTE — Progress Notes (Signed)
OT Cancellation Note  Patient Details Name: [name] MRN: [id number] DOB: 1941/05/12   Cancelled Treatment:    Reason Eval/Treat Not Completed: Pain limiting ability to participate. Pt awaiting pain medication; RN aware. Will try to return later today if possible.  Thalia Turkington 11/02/2016, 2:48 PM  Lesle Chris, OTR/L [phone] 11/02/2016

## 2016-11-02 NOTE — Telephone Encounter (Signed)
FYI-Patient is currently admitted to the hospital.

## 2016-11-03 LAB — BASIC METABOLIC PANEL
Anion gap: 7 (ref 5–15)
BUN: 14 mg/dL (ref 6–20)
CHLORIDE: 102 mmol/L (ref 101–111)
CO2: 29 mmol/L (ref 22–32)
Calcium: 8.4 mg/dL — ABNORMAL LOW (ref 8.9–10.3)
Creatinine, Ser: 0.74 mg/dL (ref 0.44–1.00)
GFR calc Af Amer: >60 mL/min (ref >60)
GFR calc non Af Amer: >60 mL/min (ref >60)
GLUCOSE: 113 mg/dL — AB (ref 65–99)
Potassium: 4.2 mmol/L (ref 3.5–5.1)
Sodium: 138 mmol/L (ref 135–145)

## 2016-11-03 LAB — CBC WITH DIFFERENTIAL/PLATELET
Basophils Absolute: 0 10*3/uL (ref 0.0–0.1)
Basophils Relative: 0 %
EOS PCT: 4 %
Eosinophils Absolute: 0.3 10*3/uL (ref 0.0–0.7)
HEMATOCRIT: 33.3 % — AB (ref 36.0–46.0)
HEMOGLOBIN: 10.8 g/dL — AB (ref 12.0–15.0)
LYMPHS ABS: 1.2 10*3/uL (ref 0.7–4.0)
LYMPHS PCT: 17 %
MCH: 31.4 pg (ref 26.0–34.0)
MCHC: 32.4 g/dL (ref 30.0–36.0)
MCV: 96.8 fL (ref 78.0–100.0)
Monocytes Absolute: 0.5 10*3/uL (ref 0.1–1.0)
Monocytes Relative: 8 %
Neutro Abs: 4.9 10*3/uL (ref 1.7–7.7)
Neutrophils Relative %: 71 %
PLATELETS: 82 10*3/uL — AB (ref 150–400)
RBC: 3.44 MIL/uL — AB (ref 3.87–5.11)
RDW: 14.7 % (ref 11.5–15.5)
WBC: 6.9 10*3/uL (ref 4.0–10.5)

## 2016-11-03 NOTE — NC FL2 (Signed)
[place] LEVEL OF CARE SCREENING TOOL     IDENTIFICATION  Patient Name: [name] Birthdate: 01/27/1941 Sex: female Admission Date (Current Location): 11/01/2016  [facility] and [place] Number:  Herbalist and Address:  [facility],  [place] [address], [place]      Provider Number: [id number]  Attending Physician Name and Address:  Roseanne Kaufman, MD  Relative Name and Phone Number:       Current Level of Care: Hospital Recommended Level of Care: [place] Prior Approval Number:    Date Approved/Denied:   PASRR Number: [id number]  Discharge Plan: SNF    Current Diagnoses: Patient Active Problem List   Diagnosis Date Noted  . Open Colles' fracture of right radius 11/02/2016  . Pigmented villonodular synovitis of knee, left 03/22/2016  . Abnormal radionuclide bone scan 08/16/2015  . Ankle fracture, bimalleolar, closed 08/16/2015  . HLD (hyperlipidemia) 07/28/2015  . Fatty liver 09/09/2014  . Lumbago 07/07/2014  . Abnormality of gait 03/27/2014  . Memory loss 01/08/2014  . 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 Carcinoma of head of pancreas ([place]) 07/01/2013  . 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 Paresthesia of foot 06/06/2012  . 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 Colon polyps 06/05/2008  . Osteoarthritis 06/04/2008  . Chronic diarrhea 04/22/2008  . Hypothyroidism 02/26/2007  . Adjustment disorder with mixed anxiety and depressed mood 02/26/2007  . Unspecified glaucoma 02/26/2007  . Essential hypertension 02/26/2007  . Mitral valve disease 02/26/2007  . Allergic rhinitis 02/26/2007  . GERD 02/26/2007  . Hiatal hernia 02/26/2007  . Osteoporosis 02/26/2007    Orientation RESPIRATION BLADDER Height & Weight     Self, Time, Situation, Place  Normal Continent Weight: 150 lb (68 kg) Height:  5\' 1"  (154.9 cm)  BEHAVIORAL SYMPTOMS/MOOD NEUROLOGICAL BOWEL NUTRITION  STATUS      Continent Diet (regular)  AMBULATORY STATUS COMMUNICATION OF NEEDS Skin   Extensive Assist Verbally Normal                       Personal Care Assistance Level of Assistance  Bathing, Feeding, Dressing Bathing Assistance: Limited assistance Feeding assistance: Independent Dressing Assistance: Limited assistance     Functional Limitations Info  Sight, Hearing, Speech Sight Info: Adequate Hearing Info: Adequate Speech Info: Adequate    SPECIAL CARE FACTORS FREQUENCY  PT (By licensed PT), OT (By licensed OT)     PT Frequency: 5x OT Frequency: 5x            Contractures Contractures Info: Not present    Additional Factors Info  Code Status, Allergies Code Status Info: full Allergies Info: Other, Metoclopramide Hcl, Niacin, Trovan Alatrofloxacin Mesylate, Benzocaine-resorcinol, Celecoxib, Erythromycin Base, Glucosamine, Nortriptyline, Phenazopyridine Hcl, Sulfa Antibiotics, Sulfonamide Derivatives           Current Medications (11/03/2016):  This is the current hospital active medication list Current Facility-Administered Medications  Medication Dose Route Frequency Provider Last Rate Last Dose  . ALPRAZolam Duanne Moron) tablet 0.5 mg  0.5 mg Oral Q6H PRN Roseanne Kaufman, MD      . atenolol (TENORMIN) tablet 25 mg  25 mg Oral q morning - 10a Roseanne Kaufman, MD   25 mg at 11/03/16 1034  . ceFAZolin (ANCEF) IVPB 1 g/50 mL premix  1 g Intravenous Lawernce Pitts, MD 100 mL/hr at 11/03/16 1528 1 g at 11/03/16 1528  . DULoxetine (CYMBALTA) DR capsule 30 mg  30 mg Oral BID Gramig,  Gwyndolyn Saxon, MD   30 mg at 11/03/16 1034  . fluticasone (FLONASE) 50 MCG/ACT nasal spray 2 spray  2 spray Each Nare Daily Roseanne Kaufman, MD   2 spray at 11/03/16 1036  . furosemide (LASIX) tablet 20 mg  20 mg Oral Daily Roseanne Kaufman, MD   20 mg at 11/03/16 1033  . lactated ringers infusion   Intravenous Continuous Roseanne Kaufman, MD 50 mL/hr at 11/03/16 0017    . levothyroxine  (SYNTHROID, LEVOTHROID) tablet 175 mcg  175 mcg Oral QAC breakfast Roseanne Kaufman, MD   175 mcg at 11/03/16 0802  . lipase/protease/amylase (CREON) capsule 12,000 Units  12,000 Units Oral TID Almira Coaster, MD   12,000 Units at 11/03/16 1159  . LORazepam (ATIVAN) tablet 0.5 mg  0.5 mg Oral Q8H Roseanne Kaufman, MD   0.5 mg at 11/02/16 2116  . morphine 2 MG/ML injection 1 mg  1 mg Intravenous Q2H PRN Roseanne Kaufman, MD   1 mg at 11/03/16 1159  . nitrofurantoin (macrocrystal-monohydrate) (MACROBID) capsule 100 mg  100 mg Oral BID Roseanne Kaufman, MD   100 mg at 11/03/16 1033  . ondansetron (ZOFRAN) tablet 4 mg  4 mg Oral Q6H PRN Roseanne Kaufman, MD       Or  . ondansetron [facility]) injection 4 mg  4 mg Intravenous Q6H PRN Roseanne Kaufman, MD      . oxyCODONE (Oxy IR/ROXICODONE) immediate release tablet 5-10 mg  5-10 mg Oral Q3H PRN Roseanne Kaufman, MD   10 mg at 11/03/16 1033  . pantoprazole (PROTONIX) EC tablet 40 mg  40 mg Oral BID Roseanne Kaufman, MD   40 mg at 11/03/16 1033  . polycarbophil (FIBERCON) tablet 625 mg  625 mg Oral Daily Roseanne Kaufman, MD   625 mg at 11/03/16 1034  . potassium chloride (K-DUR,KLOR-CON) CR tablet 10 mEq  10 mEq Oral Daily Roseanne Kaufman, MD   10 mEq at 11/03/16 1034  . pregabalin (LYRICA) capsule 300 mg  300 mg Oral BID Roseanne Kaufman, MD   300 mg at 11/03/16 1033  . promethazine (PHENERGAN) suppository 12.5 mg  12.5 mg Rectal Q6H PRN Roseanne Kaufman, MD      . promethazine (PHENERGAN) tablet 25 mg  25 mg Oral Q6H PRN Roseanne Kaufman, MD      . psyllium (HYDROCIL/METAMUCIL) packet 1 packet  1 packet Oral Daily Roseanne Kaufman, MD      . rOPINIRole (REQUIP) tablet 0.5 mg  0.5 mg Oral QHS Roseanne Kaufman, MD   0.5 mg at 11/02/16 2116  . senna-docusate (Senokot-S) tablet 1 tablet  1 tablet Oral BID Theodis Blaze, MD   1 tablet at 11/03/16 1034  . vitamin B-12 (CYANOCOBALAMIN) tablet 2,000 mcg  2,000 mcg Oral Q1200 Roseanne Kaufman, MD   2,000 mcg at 11/03/16  1034  . Vitamin D (Ergocalciferol) (DRISDOL) capsule 50,000 Units  50,000 Units Oral Q7 days Roseanne Kaufman, MD   50,000 Units at 11/02/16 3716     Discharge Medications: Please see discharge summary for a list of discharge medications.  Relevant Imaging Results:  Relevant Lab Results:   Additional Information SSN [id number]  Nila Nephew, LCSW

## 2016-11-03 NOTE — Progress Notes (Signed)
Met with pt's son re: recommendation for SNF for ST rehab.  Son states pt has participated in [facility] in the past- facility preferences would be [facility] or [facility].  Son understanding of 3night inpatient stay needed to qualify for SNF under pt's medicare coverage and advises if pt DCs 6/8, family will plan for pt to DC home with [facility] as private pay for SNF is not an option.   CSW completed FL2 and submitted referrals.  Will follow to assist for potential SNF placement.  Sharren Bridge, MSW, LCSW Clinical Social Work 11/03/2016 [phone]

## 2016-11-03 NOTE — Progress Notes (Signed)
Pt seen and examined at bedside, reviewed VS (BP, oxygen sat, HR, temp), all stable and at target range. Blood work reviewed and noted slight drop in hg but no evidence of active bleeding. Plt also somewhat down but not too alarming at this point. Will repeat CBC and BMP again in AM. Agree with plan to d/c to SNF if deemed appropriate based on PT/OT eval.   Faye Ramsay, MD  [facility] Pager [phone]  If 7PM-7AM, please contact night-coverage [email or web address] Password [email or web address]

## 2016-11-03 NOTE — Progress Notes (Signed)
CM consult for SNF placement. This CM met with pt and son at bedside to discuss need for 3 night inpt stay for Medicare to pay for SNF. Pt became inpt on 11/02/16. If pt discharged tomorrow then will need home with home health as son states they are unable to pay privately for snf. Pt states she has used [facility] previously and would like to use them again if home. She also states she has been to [facility] and [facility] and would open to going back. All info given to CSW. Marney Doctor RN,BSN,NCM [phone]
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
## 2016-11-03 NOTE — Anesthesia Postprocedure Evaluation (Signed)
Anesthesia Post Note  Patient: [name]  Procedure(s) Performed: Procedure(s) (LRB): OPEN REDUCTION INTERNAL FIXATION (ORIF) RIGHT WRIST FRACTURE WITH APPLICATION OF SPANNING PLATE, IRRIGATION AND DEBRIDEMENT RIGHT WRIST (Right)     Patient location during evaluation: PACU Anesthesia Type: General Level of consciousness: awake and alert Pain management: pain level controlled Vital Signs Assessment: post-procedure vital signs reviewed and stable Respiratory status: spontaneous breathing, nonlabored ventilation, respiratory function stable and patient connected to nasal cannula oxygen Cardiovascular status: blood pressure returned to baseline and stable Postop Assessment: no signs of nausea or vomiting Anesthetic complications: no    Last Vitals:  Vitals:   11/02/16 2106 11/03/16 0459  BP: (!) 147/58 130/68  Pulse: 93 95  Resp: 17 16  Temp: 37.3 C 37.1 C    Last Pain:  Vitals:   11/03/16 0800  TempSrc:   PainSc: 7                  Kitara Hebb S

## 2016-11-03 NOTE — Progress Notes (Signed)
Subjective: 2 Days Post-Op Procedure(s) (LRB): OPEN REDUCTION INTERNAL FIXATION (ORIF) RIGHT WRIST FRACTURE WITH APPLICATION OF SPANNING PLATE, IRRIGATION AND DEBRIDEMENT RIGHT WRIST (Right) Patient reports pain as improved today. She is tolerating a regular diet and voiding without difficulties. She was having some difficulties with constipation but states she has had a bowel movement at this juncture. We have discussed discharge planning with her and after reviewing physical therapy and occupational therapy recommendations I have discussed with her given her significant upper extremity reconstruction and her unsteady gait recommendations for potential short-term nursing facility. She states she feels as though this would be the best route as well. She denies fevers, chills, shortness of breath, nausea or vomiting.  Objective: Vital signs in last 24 hours: Temp:  [97.4 F (36.3 C)-99.2 F (37.3 C)] 98.8 F (37.1 C) (06/07 0459) Pulse Rate:  [81-95] 95 (06/07 0459) Resp:  [16-17] 16 (06/07 0459) BP: (121-147)/(57-68) 130/68 (06/07 0459) SpO2:  [90 %-92 %] 90 % (06/07 0459)  Intake/Output from previous day: 06/06 0701 - 06/07 0700 In: 1580.8 [P.O.:900; I.V.:630.8; IV Piggyback:50] Out: -  Intake/Output this shift: Total I/O In: 480 [P.O.:480] Out: -    Recent Labs  11/01/16 1750 11/03/16 0351  HGB 13.9 10.8*    Recent Labs  11/01/16 1750 11/03/16 0351  WBC 7.1 6.9  RBC 4.30 3.44*  HCT 41.6 33.3*  PLT 106* 82*    Recent Labs  11/01/16 1750 11/03/16 0351  NA 141 138  K 4.6 4.2  CL 103 102  CO2 27 29  BUN 28* 14  CREATININE 0.93 0.74  GLUCOSE 95 113*  CALCIUM 9.3 8.4*   No results for input(s): LABPT, INR in the last 72 hours.  The patient is alert and oriented in no acute distress. The patient complains of pain in the affected upper extremity.  The patient is noted to have a normal HEENT exam. Lung fields show equal chest expansion and no shortness of  breath. Abdomen exam is nontender without distention. Lower extremity examination does not show any fracture dislocation or blood clot symptoms. Pelvis is stable and the neck and back are stable and nontender. Examination of the right upper extremity shows that she has a mild amount of increased swelling about the digits and dorsal hand today but admittedly she states she has not been participating in any range of motion. Certainly the hand is better elevated compared to yesterday. Active and passive range of motion is intact her gross sensation is intact there is no signs of infection present.  Assessment/Plan: 2 Days Post-Op Procedure(s) (LRB): OPEN REDUCTION INTERNAL FIXATION (ORIF) RIGHT WRIST FRACTURE WITH APPLICATION OF SPANNING PLATE, IRRIGATION AND DEBRIDEMENT RIGHT WRIST (Right) We will plan for continuation of IV antibiotics today and pain control. We will have therapy continue to work with her and would certainly recommend a platform walker for ambulatory purposes. She can weight-bear through the proximal forearm and elbow of the operative extremity. In addition a care management consult will be implemented to determine the potential for short-term nursing facility placement. All questions were encouraged and answered  Teigan Manner L 11/03/2016, 12:35 PM
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
## 2016-11-04 ENCOUNTER — Inpatient Hospital Stay (HOSPITAL_COMMUNITY): Payer: Medicare Other

## 2016-11-04 ENCOUNTER — Encounter (HOSPITAL_COMMUNITY): Payer: Self-pay

## 2016-11-04 LAB — BLOOD GAS, ARTERIAL
Acid-Base Excess: 5 mmol/L — ABNORMAL HIGH (ref 0.0–2.0)
Bicarbonate: 29.7 mmol/L — ABNORMAL HIGH (ref 20.0–28.0)
Drawn by: 422461
O2 Content: 2.5 L/min
O2 Saturation: 94.3 %
PATIENT TEMPERATURE: 98.7
PCO2 ART: 47.3 mmHg (ref 32.0–48.0)
PH ART: 7.415 (ref 7.350–7.450)
PO2 ART: 93.8 mmHg (ref 83.0–108.0)

## 2016-11-04 LAB — CBC
HEMATOCRIT: 31.7 % — AB (ref 36.0–46.0)
HEMOGLOBIN: 10.6 g/dL — AB (ref 12.0–15.0)
MCH: 32.4 pg (ref 26.0–34.0)
MCHC: 33.4 g/dL (ref 30.0–36.0)
MCV: 96.9 fL (ref 78.0–100.0)
Platelets: 88 10*3/uL — ABNORMAL LOW (ref 150–400)
RBC: 3.27 MIL/uL — AB (ref 3.87–5.11)
RDW: 14.4 % (ref 11.5–15.5)
WBC: 6.5 10*3/uL (ref 4.0–10.5)

## 2016-11-04 LAB — BASIC METABOLIC PANEL
Anion gap: 8 (ref 5–15)
BUN: 11 mg/dL (ref 6–20)
CHLORIDE: 101 mmol/L (ref 101–111)
CO2: 28 mmol/L (ref 22–32)
Calcium: 8.4 mg/dL — ABNORMAL LOW (ref 8.9–10.3)
Creatinine, Ser: 0.61 mg/dL (ref 0.44–1.00)
GFR calc Af Amer: >60 mL/min (ref >60)
GFR calc non Af Amer: >60 mL/min (ref >60)
Glucose, Bld: 102 mg/dL — ABNORMAL HIGH (ref 65–99)
POTASSIUM: 3.8 mmol/L (ref 3.5–5.1)
SODIUM: 137 mmol/L (ref 135–145)

## 2016-11-04 LAB — AMMONIA: Ammonia: 21 umol/L (ref 9–35)

## 2016-11-04 MED ORDER — OXYCODONE HCL 5 MG PO TABS
5.0000 mg | ORAL_TABLET | Freq: Four times a day (QID) | ORAL | Status: DC | PRN
  Administered 2016-11-04 – 2016-11-05 (×4): 5 mg via ORAL
  Filled 2016-11-04 (×4): qty 1

## 2016-11-04 MED ORDER — CEPHALEXIN 500 MG PO CAPS: 500.0000 mg | ORAL_CAPSULE | Freq: Four times a day (QID) | ORAL | 0 refills | Status: DC

## 2016-11-04 MED ORDER — OXYCODONE HCL 5 MG PO TABS: 5.0000 mg | ORAL_TABLET | Freq: Four times a day (QID) | ORAL | 0 refills | Status: DC | PRN

## 2016-11-04 MED ORDER — PREGABALIN 100 MG PO CAPS
100.0000 mg | ORAL_CAPSULE | Freq: Once | ORAL | Status: AC
  Administered 2016-11-04: 100 mg via ORAL
  Filled 2016-11-04: qty 1

## 2016-11-04 MED ORDER — KETOROLAC TROMETHAMINE 30 MG/ML IJ SOLN
30.0000 mg | Freq: Once | INTRAMUSCULAR | Status: AC
  Administered 2016-11-04: 30 mg via INTRAVENOUS
  Filled 2016-11-04: qty 1

## 2016-11-04 NOTE — Clinical Social Work Note (Signed)
CSW spoke to pt's son regarding facility choice.  Son expressed that pt would like a private room and selected [facility].  CSW informed facility of family's choice.  CSW will continue to follow and assist with d/c planning needs.  [place], [place]

## 2016-11-04 NOTE — Discharge Instructions (Signed)
We recommend that you to take vitamin C 1000 mg a day to promote healing. We also recommend that if you require  pain medicine that you take a stool softener to prevent constipation as most pain medicines will have constipation side effects. We recommend either Peri-Colace or Senokot and recommend that you also consider adding MiraLAX as well to prevent the constipation affects from pain medicine if you are required to use them. These medicines are over the counter and may be purchased at a local pharmacy. A cup of yogurt and a probiotic can also be helpful during the recovery process as the medicines can disrupt your intestinal environment. Keep bandage clean and dry.  Call for any problems.  No smoking.  Criteria for driving a car: you should be off your pain medicine for 7-8 hours, able to drive one handed(confident), thinking clearly and feeling able in your judgement to drive. Continue elevation as it will decrease swelling.  If instructed by MD move your fingers within the confines of the bandage/splint.  Use ice if instructed by your MD. Call immediately for any sudden loss of feeling in your hand/arm or change in functional abilities of the extremity. Attention physical therapy and occupational therapy the patient may weight-bear through the proximal forearm and right elbow in addition we recommend a platform walker for ambulatory purposes. Please work on elevation, edema control, digital massage. Encourage frequent active range of motion of the digits. Shoulder range of motion may be implemented to the right upper extremity. Please see and evaluate and treat for gait instability and generalized deconditioning.
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
## 2016-11-04 NOTE — Progress Notes (Addendum)
Pt experienced new onset mental confusion around 2200. She walked down the hall to another room, looking for the nurses stable. A&O times 3, but was confused as to why she was here. O2 was 87. Placed on 2.5 L O2 and is not at 99. Paged MD. And he put in new orders. Rapid also paged.
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
## 2016-11-04 NOTE — Significant Event (Signed)
Rapid Response Event Note  Overview: Time Called: 1194 Arrival Time: 0050 Event Type: Neurologic  Initial Focused Assessment:  Earlier in shift, pt had wandered down hall into a different pt's room, primary RN concerned about AMS changes after giving morphine, oxycodone, ativan, and lyrica. Pt drowsy but answers questions clearly and oriented to self, place, time, but confused about reason for hospital admission. VSS, MD ordered CT, ABG, and BMET. Neuro assessment negative for stroke.   HR 81 RR 16 98% on 2L [place] 144/67  Interventions: CT, BMET, ABG, focused neuro assessment  Plan of Care (if not transferred): recommended neuro checks every 2 hours while pt still confused to assess for changes, advised to call back if change occurs  Event Summary: Name of Physician Notified: Hal Hope at  (PTA, primary RN called)    at    Outcome: Stayed in room and stabalized  Event End Time: 0110  Guadalupe Dawn, RN, RRT

## 2016-11-04 NOTE — Plan of Care (Signed)
Last midnight patient was found to be more confused. On exam patient appears nonfocal. CT head was unremarkable. ABG did not show any carbon dioxide retention. Ammonia levels were normal. Did discuss with on-call neurologist. Patient did receive pain medication and Lyrica few hours  before the episode. Will closely observe.  Gean Birchwood.
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
## 2016-11-04 NOTE — Progress Notes (Signed)
Patient seen and examined at bedside, reports feeling better this morning. I have reviewed his vital signs, blood pressure, temperature, oxygen saturation stable. Blood work remains stable in the past 24 hours including platelet count. From medical standpoint, patient is clear for discharge when okay by primary team. Resume previous home medicines.  Please call us with further questions otherwise we will sign off.  Faye Ramsay, MD  [facility] Pager [phone] Cell phone [phone]  If 7PM-7AM, please contact night-coverage [email or web address] Password [email or web address]

## 2016-11-05 DIAGNOSIS — C259 Malignant neoplasm of pancreas, unspecified: Secondary | ICD-10-CM | POA: Diagnosis not present

## 2016-11-05 DIAGNOSIS — I10 Essential (primary) hypertension: Secondary | ICD-10-CM | POA: Diagnosis not present

## 2016-11-05 DIAGNOSIS — R9431 Abnormal electrocardiogram [ECG] [EKG]: Secondary | ICD-10-CM | POA: Diagnosis not present

## 2016-11-05 DIAGNOSIS — K760 Fatty (change of) liver, not elsewhere classified: Secondary | ICD-10-CM | POA: Diagnosis not present

## 2016-11-05 DIAGNOSIS — K219 Gastro-esophageal reflux disease without esophagitis: Secondary | ICD-10-CM | POA: Diagnosis not present

## 2016-11-05 DIAGNOSIS — W109XXA Fall (on) (from) unspecified stairs and steps, initial encounter: Secondary | ICD-10-CM | POA: Diagnosis not present

## 2016-11-05 DIAGNOSIS — Y9301 Activity, walking, marching and hiking: Secondary | ICD-10-CM | POA: Diagnosis not present

## 2016-11-05 DIAGNOSIS — M79671 Pain in right foot: Secondary | ICD-10-CM | POA: Diagnosis not present

## 2016-11-05 DIAGNOSIS — S79911A Unspecified injury of right hip, initial encounter: Secondary | ICD-10-CM | POA: Diagnosis not present

## 2016-11-05 DIAGNOSIS — Z8507 Personal history of malignant neoplasm of pancreas: Secondary | ICD-10-CM | POA: Diagnosis not present

## 2016-11-05 DIAGNOSIS — S0990XA Unspecified injury of head, initial encounter: Secondary | ICD-10-CM | POA: Diagnosis not present

## 2016-11-05 DIAGNOSIS — M1990 Unspecified osteoarthritis, unspecified site: Secondary | ICD-10-CM | POA: Diagnosis not present

## 2016-11-05 DIAGNOSIS — R51 Headache: Secondary | ICD-10-CM | POA: Diagnosis not present

## 2016-11-05 DIAGNOSIS — H409 Unspecified glaucoma: Secondary | ICD-10-CM | POA: Diagnosis not present

## 2016-11-05 DIAGNOSIS — R5381 Other malaise: Secondary | ICD-10-CM | POA: Diagnosis not present

## 2016-11-05 DIAGNOSIS — S299XXA Unspecified injury of thorax, initial encounter: Secondary | ICD-10-CM | POA: Diagnosis not present

## 2016-11-05 DIAGNOSIS — S32436A Nondisplaced fracture of anterior column [iliopubic] of unspecified acetabulum, initial encounter for closed fracture: Secondary | ICD-10-CM | POA: Diagnosis not present

## 2016-11-05 DIAGNOSIS — Z23 Encounter for immunization: Secondary | ICD-10-CM | POA: Diagnosis not present

## 2016-11-05 DIAGNOSIS — Z882 Allergy status to sulfonamides status: Secondary | ICD-10-CM | POA: Diagnosis not present

## 2016-11-05 DIAGNOSIS — R531 Weakness: Secondary | ICD-10-CM | POA: Diagnosis not present

## 2016-11-05 DIAGNOSIS — S32591A Other specified fracture of right pubis, initial encounter for closed fracture: Secondary | ICD-10-CM | POA: Diagnosis not present

## 2016-11-05 DIAGNOSIS — Z4789 Encounter for other orthopedic aftercare: Secondary | ICD-10-CM | POA: Diagnosis not present

## 2016-11-05 DIAGNOSIS — R41841 Cognitive communication deficit: Secondary | ICD-10-CM | POA: Diagnosis not present

## 2016-11-05 DIAGNOSIS — Z8673 Personal history of transient ischemic attack (TIA), and cerebral infarction without residual deficits: Secondary | ICD-10-CM | POA: Diagnosis not present

## 2016-11-05 DIAGNOSIS — S52501B Unspecified fracture of the lower end of right radius, initial encounter for open fracture type I or II: Secondary | ICD-10-CM | POA: Diagnosis not present

## 2016-11-05 DIAGNOSIS — S52571E Other intraarticular fracture of lower end of right radius, subsequent encounter for open fracture type I or II with routine healing: Secondary | ICD-10-CM | POA: Diagnosis not present

## 2016-11-05 DIAGNOSIS — S99921A Unspecified injury of right foot, initial encounter: Secondary | ICD-10-CM | POA: Diagnosis not present

## 2016-11-05 DIAGNOSIS — E039 Hypothyroidism, unspecified: Secondary | ICD-10-CM | POA: Diagnosis not present

## 2016-11-05 DIAGNOSIS — G609 Hereditary and idiopathic neuropathy, unspecified: Secondary | ICD-10-CM | POA: Diagnosis not present

## 2016-11-05 DIAGNOSIS — Z90411 Acquired partial absence of pancreas: Secondary | ICD-10-CM | POA: Diagnosis not present

## 2016-11-05 DIAGNOSIS — F4323 Adjustment disorder with mixed anxiety and depressed mood: Secondary | ICD-10-CM | POA: Diagnosis not present

## 2016-11-05 DIAGNOSIS — R29898 Other symptoms and signs involving the musculoskeletal system: Secondary | ICD-10-CM | POA: Diagnosis not present

## 2016-11-05 DIAGNOSIS — M25551 Pain in right hip: Secondary | ICD-10-CM | POA: Diagnosis not present

## 2016-11-05 DIAGNOSIS — Y92481 Parking lot as the place of occurrence of the external cause: Secondary | ICD-10-CM | POA: Diagnosis not present

## 2016-11-05 DIAGNOSIS — W19XXXD Unspecified fall, subsequent encounter: Secondary | ICD-10-CM | POA: Diagnosis not present

## 2016-11-05 DIAGNOSIS — Z7982 Long term (current) use of aspirin: Secondary | ICD-10-CM | POA: Diagnosis not present

## 2016-11-05 DIAGNOSIS — E785 Hyperlipidemia, unspecified: Secondary | ICD-10-CM | POA: Diagnosis not present

## 2016-11-05 DIAGNOSIS — M545 Low back pain: Secondary | ICD-10-CM | POA: Diagnosis not present

## 2016-11-05 DIAGNOSIS — S62101A Fracture of unspecified carpal bone, right wrist, initial encounter for closed fracture: Secondary | ICD-10-CM | POA: Diagnosis not present

## 2016-11-05 DIAGNOSIS — F329 Major depressive disorder, single episode, unspecified: Secondary | ICD-10-CM | POA: Diagnosis not present

## 2016-11-05 DIAGNOSIS — K8681 Exocrine pancreatic insufficiency: Secondary | ICD-10-CM | POA: Diagnosis not present

## 2016-11-05 DIAGNOSIS — S6291XD Unspecified fracture of right wrist and hand, subsequent encounter for fracture with routine healing: Secondary | ICD-10-CM | POA: Diagnosis not present

## 2016-11-05 DIAGNOSIS — R296 Repeated falls: Secondary | ICD-10-CM | POA: Diagnosis not present

## 2016-11-05 DIAGNOSIS — R2689 Other abnormalities of gait and mobility: Secondary | ICD-10-CM | POA: Diagnosis not present

## 2016-11-05 MED ORDER — LORAZEPAM 0.5 MG PO TABS: 0.5000 mg | ORAL_TABLET | Freq: Three times a day (TID) | ORAL | 0 refills | Status: DC

## 2016-11-05 NOTE — Progress Notes (Signed)
Subjective: 4 Days Post-Op Procedure(s) (LRB): OPEN REDUCTION INTERNAL FIXATION (ORIF) RIGHT WRIST FRACTURE WITH APPLICATION OF SPANNING PLATE, IRRIGATION AND DEBRIDEMENT RIGHT WRIST (Right) Patient reports pain as 2 on 0-10 scale.   The patient reports that her pain is well controlled. She is tolerating food with no difficulty and has passed flatus. She is sitting up comfortably in her chair. She denies fever, chills, shortness of breath, chest pain, nausea, vomiting, or numbness in the hand.  Objective: Vital signs in last 24 hours: Temp:  [98.1 F (36.7 C)-98.5 F (36.9 C)] 98.1 F (36.7 C) (06/09 0438) Pulse Rate:  [80-91] 91 (06/09 0438) Resp:  [16-18] 16 (06/09 0438) BP: (134-150)/(61-77) 134/61 (06/09 0438) SpO2:  [94 %-99 %] 94 % (06/09 0438)  Intake/Output from previous day: 06/08 0701 - 06/09 0700 In: 1320 [P.O.:1320] Out: 1400 [Urine:1400] Intake/Output this shift: Total I/O In: -  Out: 100 [Urine:100]   Recent Labs  11/03/16 0351 11/04/16 0057  HGB 10.8* 10.6*    Recent Labs  11/03/16 0351 11/04/16 0057  WBC 6.9 6.5  RBC 3.44* 3.27*  HCT 33.3* 31.7*  PLT 82* 88*    Recent Labs  11/03/16 0351 11/04/16 0057  NA 138 137  K 4.2 3.8  CL 102 101  CO2 29 28  BUN 14 11  CREATININE 0.74 0.61  GLUCOSE 113* 102*  CALCIUM 8.4* 8.4*   No results for input(s): LABPT, INR in the last 72 hours.  Sensation intact distally  Alert and oriented x 3 Sugar tong splint clean, dry and intact.  The fingers have moderate swelling and ecchymosis with no open wounds or drainage. Neurovascularly intact grossly.  Sensation intact to light touch.  She is able to wiggle all fingers.   Assessment/Plan: 4 Days Post-Op Procedure(s) (LRB): OPEN REDUCTION INTERNAL FIXATION (ORIF) RIGHT WRIST FRACTURE WITH APPLICATION OF SPANNING PLATE, IRRIGATION AND DEBRIDEMENT RIGHT WRIST (Right)  Continue to progress activity as tolerated.  Continue with pain management. Discussed  remaining in splint until follow up appointment in the office. Discussed keeping the dressing clean and dry. Discussed splint and post-operative bandage care protocol. Discharge to SNF today. Her son is planning on picking her up to take her to SNF.    [name] 11/05/2016, 8:18 AM

## 2016-11-05 NOTE — Progress Notes (Signed)
Report called to Ty at [facility]. Patient discharged via private vehicle with son. Discharge instructions reviewed with pt and son both verbalized understanding

## 2016-11-05 NOTE — Clinical Social Work Placement (Signed)
   CLINICAL SOCIAL WORK PLACEMENT  NOTE  Date:  11/05/2016  Patient Details  Name: [name] MRN: [id number] Date of Birth: 02-Sep-1940  Clinical Social Work is seeking post-discharge placement for this patient at the [place] level of care (*CSW will initial, date and re-position this form in  chart as items are completed):  Yes   Patient/family provided with [place] Work Department's list of facilities offering this level of care within the geographic area requested by the patient (or if unable, by the patient's family).  Yes   Patient/family informed of their freedom to choose among providers that offer the needed level of care, that participate in Medicare, Medicaid or managed care program needed by the patient, have an available bed and are willing to accept the patient.  Yes   Patient/family informed of [facility]'s ownership interest in [facility] and [facility], as well as of the fact that they are under no obligation to receive care at these facilities.  PASRR submitted to EDS on       PASRR number received on       Existing PASRR number confirmed on       FL2 transmitted to all facilities in geographic area requested by pt/family on       FL2 transmitted to all facilities within larger geographic area on       Patient informed that his/her managed care company has contracts with or will negotiate with certain facilities, including the following:        Yes   Patient/family informed of bed offers received.  Patient chooses bed at [facility]     Physician recommends and patient chooses bed at      Patient to be transferred to [facility] on 11/05/16.  Patient to be transferred to facility by Family      Patient family notified on 11/05/16 of transfer.  Name of family member notified:  Patient notified son     PHYSICIAN       Additional Comment:     _______________________________________________ Weston Anna, LCSW 11/05/2016, 9:47 AM
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
## 2016-11-05 NOTE — Care Management Important Message (Signed)
Important Message  Patient Details  Name: [name] MRN: [id number] Date of Birth: 04-26-1941   Medicare Important Message Given:  Yes    Erenest Rasher, RN 11/05/2016, 10:27 AM

## 2016-11-05 NOTE — Care Management Note (Signed)
Case Management Note  Patient Details  Name: SHAUNEE MULKERN MRN: [id number] Date of Birth: 02-04-1941  Subjective/Objective:    ORIF right wrist, fall                Action/Plan: Discharge Planning: NCM spoke to pt's son, [name] at bedside. Pt lives at home alone and will go to SNF rehab. CSW following for SNF-rehab.   PCP Briscoe Deutscher MD  Expected Discharge Date:  11/05/16               Expected Discharge Plan:  Skilled Nursing Facility  In-House Referral:  Clinical Social Work  Discharge planning Services  CM Consult  Post Acute Care Choice:  NA Choice offered to:  NA  DME Arranged:  N/A DME Agency:  NA  HH Arranged:  NA HH Agency:  NA  Status of Service:  Completed, signed off  If discussed at [place] of Stay Meetings, dates discussed:    Additional Comments:  Erenest Rasher, RN 11/05/2016, 10:27 AM

## 2016-11-05 NOTE — Progress Notes (Signed)
Patient is set to discharge to [facility] today. Patient &son aware. Discharge packet given to RN, Festus Holts. Son to transport patient after 1PM.  Kingsley Spittle, [facility] Clinical Social Worker [phone]
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
## 2016-11-08 ENCOUNTER — Other Ambulatory Visit: Payer: Self-pay | Admitting: *Deleted

## 2016-11-08 DIAGNOSIS — C259 Malignant neoplasm of pancreas, unspecified: Secondary | ICD-10-CM | POA: Diagnosis not present

## 2016-11-08 DIAGNOSIS — S6291XD Unspecified fracture of right wrist and hand, subsequent encounter for fracture with routine healing: Secondary | ICD-10-CM | POA: Diagnosis not present

## 2016-11-08 DIAGNOSIS — I10 Essential (primary) hypertension: Secondary | ICD-10-CM | POA: Diagnosis not present

## 2016-11-08 DIAGNOSIS — F329 Major depressive disorder, single episode, unspecified: Secondary | ICD-10-CM | POA: Diagnosis not present
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
## 2016-11-09 DIAGNOSIS — I10 Essential (primary) hypertension: Secondary | ICD-10-CM | POA: Diagnosis not present

## 2016-11-09 DIAGNOSIS — E039 Hypothyroidism, unspecified: Secondary | ICD-10-CM | POA: Diagnosis not present

## 2016-11-09 DIAGNOSIS — E785 Hyperlipidemia, unspecified: Secondary | ICD-10-CM | POA: Diagnosis not present

## 2016-11-09 DIAGNOSIS — S62101A Fracture of unspecified carpal bone, right wrist, initial encounter for closed fracture: Secondary | ICD-10-CM | POA: Diagnosis not present

## 2016-11-09 DIAGNOSIS — Z4789 Encounter for other orthopedic aftercare: Secondary | ICD-10-CM | POA: Diagnosis not present

## 2016-11-09 DIAGNOSIS — S52571E Other intraarticular fracture of lower end of right radius, subsequent encounter for open fracture type I or II with routine healing: Secondary | ICD-10-CM | POA: Diagnosis not present

## 2016-11-15 DIAGNOSIS — S6291XD Unspecified fracture of right wrist and hand, subsequent encounter for fracture with routine healing: Secondary | ICD-10-CM | POA: Diagnosis not present

## 2016-11-15 DIAGNOSIS — E039 Hypothyroidism, unspecified: Secondary | ICD-10-CM | POA: Diagnosis not present

## 2016-11-15 DIAGNOSIS — C259 Malignant neoplasm of pancreas, unspecified: Secondary | ICD-10-CM | POA: Diagnosis not present

## 2016-11-15 DIAGNOSIS — I10 Essential (primary) hypertension: Secondary | ICD-10-CM | POA: Diagnosis not present

## 2016-11-16 DIAGNOSIS — Z4789 Encounter for other orthopedic aftercare: Secondary | ICD-10-CM | POA: Diagnosis not present

## 2016-11-16 DIAGNOSIS — S52571E Other intraarticular fracture of lower end of right radius, subsequent encounter for open fracture type I or II with routine healing: Secondary | ICD-10-CM | POA: Diagnosis not present

## 2016-11-21 ENCOUNTER — Emergency Department (HOSPITAL_COMMUNITY): Payer: Medicare Other

## 2016-11-21 ENCOUNTER — Observation Stay (HOSPITAL_COMMUNITY)
Admission: EM | Admit: 2016-11-21 | Discharge: 2016-11-22 | Disposition: A | Discharge status: Home / Self Care | Payer: Medicare Other | Attending: Family Medicine | Admitting: Family Medicine

## 2016-11-21 ENCOUNTER — Encounter (HOSPITAL_COMMUNITY): Payer: Self-pay | Admitting: Family Medicine

## 2016-11-21 DIAGNOSIS — S32591A Other specified fracture of right pubis, initial encounter for closed fracture: Secondary | ICD-10-CM | POA: Diagnosis not present

## 2016-11-21 DIAGNOSIS — R51 Headache: Secondary | ICD-10-CM | POA: Insufficient documentation

## 2016-11-21 DIAGNOSIS — I10 Essential (primary) hypertension: Secondary | ICD-10-CM | POA: Diagnosis present

## 2016-11-21 DIAGNOSIS — R9431 Abnormal electrocardiogram [ECG] [EKG]: Secondary | ICD-10-CM | POA: Diagnosis not present

## 2016-11-21 DIAGNOSIS — K760 Fatty (change of) liver, not elsewhere classified: Secondary | ICD-10-CM | POA: Insufficient documentation

## 2016-11-21 DIAGNOSIS — R296 Repeated falls: Secondary | ICD-10-CM | POA: Diagnosis present

## 2016-11-21 DIAGNOSIS — R5381 Other malaise: Secondary | ICD-10-CM | POA: Insufficient documentation

## 2016-11-21 DIAGNOSIS — R531 Weakness: Secondary | ICD-10-CM | POA: Insufficient documentation

## 2016-11-21 DIAGNOSIS — S32436A Nondisplaced fracture of anterior column [iliopubic] of unspecified acetabulum, initial encounter for closed fracture: Secondary | ICD-10-CM | POA: Diagnosis not present

## 2016-11-21 DIAGNOSIS — E039 Hypothyroidism, unspecified: Secondary | ICD-10-CM | POA: Diagnosis present

## 2016-11-21 DIAGNOSIS — M545 Low back pain: Secondary | ICD-10-CM | POA: Diagnosis not present

## 2016-11-21 DIAGNOSIS — E785 Hyperlipidemia, unspecified: Secondary | ICD-10-CM | POA: Insufficient documentation

## 2016-11-21 DIAGNOSIS — K8681 Exocrine pancreatic insufficiency: Secondary | ICD-10-CM | POA: Diagnosis present

## 2016-11-21 DIAGNOSIS — G609 Hereditary and idiopathic neuropathy, unspecified: Secondary | ICD-10-CM | POA: Diagnosis present

## 2016-11-21 DIAGNOSIS — W19XXXA Unspecified fall, initial encounter: Secondary | ICD-10-CM

## 2016-11-21 DIAGNOSIS — S79911A Unspecified injury of right hip, initial encounter: Secondary | ICD-10-CM | POA: Diagnosis not present

## 2016-11-21 DIAGNOSIS — Z90411 Acquired partial absence of pancreas: Secondary | ICD-10-CM | POA: Insufficient documentation

## 2016-11-21 DIAGNOSIS — C259 Malignant neoplasm of pancreas, unspecified: Secondary | ICD-10-CM | POA: Diagnosis not present

## 2016-11-21 DIAGNOSIS — H409 Unspecified glaucoma: Secondary | ICD-10-CM | POA: Insufficient documentation

## 2016-11-21 DIAGNOSIS — Z8507 Personal history of malignant neoplasm of pancreas: Secondary | ICD-10-CM | POA: Insufficient documentation

## 2016-11-21 DIAGNOSIS — S299XXA Unspecified injury of thorax, initial encounter: Secondary | ICD-10-CM | POA: Diagnosis not present

## 2016-11-21 DIAGNOSIS — W19XXXD Unspecified fall, subsequent encounter: Secondary | ICD-10-CM | POA: Diagnosis not present

## 2016-11-21 DIAGNOSIS — Z7982 Long term (current) use of aspirin: Secondary | ICD-10-CM | POA: Insufficient documentation

## 2016-11-21 DIAGNOSIS — K219 Gastro-esophageal reflux disease without esophagitis: Secondary | ICD-10-CM | POA: Insufficient documentation

## 2016-11-21 DIAGNOSIS — Z882 Allergy status to sulfonamides status: Secondary | ICD-10-CM | POA: Insufficient documentation

## 2016-11-21 DIAGNOSIS — S6291XD Unspecified fracture of right wrist and hand, subsequent encounter for fracture with routine healing: Secondary | ICD-10-CM | POA: Diagnosis not present

## 2016-11-21 DIAGNOSIS — M25551 Pain in right hip: Secondary | ICD-10-CM | POA: Diagnosis not present

## 2016-11-21 DIAGNOSIS — W109XXA Fall (on) (from) unspecified stairs and steps, initial encounter: Secondary | ICD-10-CM | POA: Insufficient documentation

## 2016-11-21 DIAGNOSIS — M79671 Pain in right foot: Secondary | ICD-10-CM | POA: Diagnosis not present

## 2016-11-21 DIAGNOSIS — S0990XA Unspecified injury of head, initial encounter: Secondary | ICD-10-CM | POA: Diagnosis not present

## 2016-11-21 DIAGNOSIS — M1990 Unspecified osteoarthritis, unspecified site: Secondary | ICD-10-CM | POA: Insufficient documentation

## 2016-11-21 DIAGNOSIS — Z8673 Personal history of transient ischemic attack (TIA), and cerebral infarction without residual deficits: Secondary | ICD-10-CM | POA: Insufficient documentation

## 2016-11-21 DIAGNOSIS — F4323 Adjustment disorder with mixed anxiety and depressed mood: Secondary | ICD-10-CM | POA: Insufficient documentation

## 2016-11-21 DIAGNOSIS — S99921A Unspecified injury of right foot, initial encounter: Secondary | ICD-10-CM | POA: Diagnosis not present

## 2016-11-21 LAB — I-STAT CHEM 8, ED
BUN: 25 mg/dL — AB (ref 6–20)
CALCIUM ION: 1.05 mmol/L — AB (ref 1.15–1.40)
CHLORIDE: 101 mmol/L (ref 101–111)
Creatinine, Ser: 0.9 mg/dL (ref 0.44–1.00)
Glucose, Bld: 89 mg/dL (ref 65–99)
HCT: 37 % (ref 36.0–46.0)
Hemoglobin: 12.6 g/dL (ref 12.0–15.0)
Potassium: 3.8 mmol/L (ref 3.5–5.1)
Sodium: 140 mmol/L (ref 135–145)
TCO2: 25 mmol/L (ref 0–100)

## 2016-11-21 LAB — CBC WITH DIFFERENTIAL/PLATELET
Basophils Absolute: 0 10*3/uL (ref 0.0–0.1)
Basophils Relative: 0 %
EOS PCT: 3 %
Eosinophils Absolute: 0.2 10*3/uL (ref 0.0–0.7)
HEMATOCRIT: 38.7 % (ref 36.0–46.0)
HEMOGLOBIN: 13.1 g/dL (ref 12.0–15.0)
LYMPHS ABS: 1.2 10*3/uL (ref 0.7–4.0)
LYMPHS PCT: 20 %
MCH: 32.8 pg (ref 26.0–34.0)
MCHC: 33.9 g/dL (ref 30.0–36.0)
MCV: 96.8 fL (ref 78.0–100.0)
Monocytes Absolute: 0.6 10*3/uL (ref 0.1–1.0)
Monocytes Relative: 10 %
NEUTROS ABS: 3.9 10*3/uL (ref 1.7–7.7)
NEUTROS PCT: 66 %
Platelets: 114 10*3/uL — ABNORMAL LOW (ref 150–400)
RBC: 4 MIL/uL (ref 3.87–5.11)
RDW: 14.8 % (ref 11.5–15.5)
WBC: 5.8 10*3/uL (ref 4.0–10.5)

## 2016-11-21 LAB — URINALYSIS, ROUTINE W REFLEX MICROSCOPIC
Bilirubin Urine: NEGATIVE
Glucose, UA: NEGATIVE mg/dL
HGB URINE DIPSTICK: NEGATIVE
Ketones, ur: NEGATIVE mg/dL
Nitrite: NEGATIVE
Protein, ur: NEGATIVE mg/dL
SPECIFIC GRAVITY, URINE: 1.011 (ref 1.005–1.030)
pH: 5 (ref 5.0–8.0)

## 2016-11-21 LAB — TROPONIN I: Troponin I: <0.03 ng/mL (ref <0.03)
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
## 2016-11-21 NOTE — ED Notes (Signed)
Patient transported to X-ray 
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
## 2016-11-21 NOTE — ED Notes (Signed)
Attempted to ambulate pt. Pt was leaning to the side when she tried to sit up and was unable to stand on her right leg.

## 2016-11-22 ENCOUNTER — Encounter (HOSPITAL_COMMUNITY): Payer: Self-pay | Admitting: Internal Medicine

## 2016-11-22 DIAGNOSIS — R41841 Cognitive communication deficit: Secondary | ICD-10-CM | POA: Diagnosis not present

## 2016-11-22 DIAGNOSIS — G2581 Restless legs syndrome: Secondary | ICD-10-CM | POA: Diagnosis not present

## 2016-11-22 DIAGNOSIS — I10 Essential (primary) hypertension: Secondary | ICD-10-CM

## 2016-11-22 DIAGNOSIS — E039 Hypothyroidism, unspecified: Secondary | ICD-10-CM

## 2016-11-22 DIAGNOSIS — F322 Major depressive disorder, single episode, severe without psychotic features: Secondary | ICD-10-CM | POA: Diagnosis not present

## 2016-11-22 DIAGNOSIS — K8689 Other specified diseases of pancreas: Secondary | ICD-10-CM | POA: Diagnosis not present

## 2016-11-22 DIAGNOSIS — W19XXXD Unspecified fall, subsequent encounter: Secondary | ICD-10-CM | POA: Diagnosis not present

## 2016-11-22 DIAGNOSIS — S329XXA Fracture of unspecified parts of lumbosacral spine and pelvis, initial encounter for closed fracture: Secondary | ICD-10-CM | POA: Diagnosis not present

## 2016-11-22 DIAGNOSIS — R531 Weakness: Secondary | ICD-10-CM | POA: Diagnosis not present

## 2016-11-22 DIAGNOSIS — M1990 Unspecified osteoarthritis, unspecified site: Secondary | ICD-10-CM | POA: Diagnosis not present

## 2016-11-22 DIAGNOSIS — G629 Polyneuropathy, unspecified: Secondary | ICD-10-CM | POA: Diagnosis not present

## 2016-11-22 DIAGNOSIS — S32501D Unspecified fracture of right pubis, subsequent encounter for fracture with routine healing: Secondary | ICD-10-CM | POA: Diagnosis not present

## 2016-11-22 DIAGNOSIS — K8681 Exocrine pancreatic insufficiency: Secondary | ICD-10-CM

## 2016-11-22 DIAGNOSIS — W19XXXA Unspecified fall, initial encounter: Secondary | ICD-10-CM

## 2016-11-22 DIAGNOSIS — G609 Hereditary and idiopathic neuropathy, unspecified: Secondary | ICD-10-CM | POA: Diagnosis not present

## 2016-11-22 DIAGNOSIS — S32591A Other specified fracture of right pubis, initial encounter for closed fracture: Secondary | ICD-10-CM | POA: Diagnosis not present

## 2016-11-22 DIAGNOSIS — Z4789 Encounter for other orthopedic aftercare: Secondary | ICD-10-CM | POA: Diagnosis not present

## 2016-11-22 DIAGNOSIS — S62101A Fracture of unspecified carpal bone, right wrist, initial encounter for closed fracture: Secondary | ICD-10-CM | POA: Diagnosis not present

## 2016-11-22 DIAGNOSIS — R2689 Other abnormalities of gait and mobility: Secondary | ICD-10-CM | POA: Diagnosis not present

## 2016-11-22 DIAGNOSIS — R29898 Other symptoms and signs involving the musculoskeletal system: Secondary | ICD-10-CM | POA: Diagnosis not present

## 2016-11-22 DIAGNOSIS — F0390 Unspecified dementia without behavioral disturbance: Secondary | ICD-10-CM | POA: Diagnosis not present

## 2016-11-22 DIAGNOSIS — S6291XD Unspecified fracture of right wrist and hand, subsequent encounter for fracture with routine healing: Secondary | ICD-10-CM | POA: Diagnosis not present

## 2016-11-22 DIAGNOSIS — C259 Malignant neoplasm of pancreas, unspecified: Secondary | ICD-10-CM | POA: Diagnosis not present

## 2016-11-22 HISTORY — DX: Other specified fracture of right pubis, initial encounter for closed fracture: S32.591A

## 2016-11-22 LAB — CBC
HEMATOCRIT: 36.3 % (ref 36.0–46.0)
Hemoglobin: 12.2 g/dL (ref 12.0–15.0)
MCH: 32 pg (ref 26.0–34.0)
MCHC: 33.6 g/dL (ref 30.0–36.0)
MCV: 95.3 fL (ref 78.0–100.0)
Platelets: 101 10*3/uL — ABNORMAL LOW (ref 150–400)
RBC: 3.81 MIL/uL — ABNORMAL LOW (ref 3.87–5.11)
RDW: 14.6 % (ref 11.5–15.5)
WBC: 5.4 10*3/uL (ref 4.0–10.5)

## 2016-11-22 LAB — BASIC METABOLIC PANEL
Anion gap: 11 (ref 5–15)
BUN: 22 mg/dL — AB (ref 6–20)
CHLORIDE: 107 mmol/L (ref 101–111)
CO2: 23 mmol/L (ref 22–32)
Calcium: 8.1 mg/dL — ABNORMAL LOW (ref 8.9–10.3)
Creatinine, Ser: 0.67 mg/dL (ref 0.44–1.00)
GFR calc Af Amer: >60 mL/min (ref >60)
GFR calc non Af Amer: >60 mL/min (ref >60)
GLUCOSE: 87 mg/dL (ref 65–99)
POTASSIUM: 3.7 mmol/L (ref 3.5–5.1)
Sodium: 141 mmol/L (ref 135–145)

## 2016-11-22 LAB — AMMONIA: Ammonia: 16 umol/L (ref 9–35)

## 2016-11-22 LAB — T4, FREE: Free T4: 0.88 ng/dL (ref 0.61–1.12)

## 2016-11-22 LAB — VITAMIN B12: Vitamin B-12: 3646 pg/mL — ABNORMAL HIGH (ref 180–914)

## 2016-11-22 LAB — TSH: TSH: 9.812 u[IU]/mL — ABNORMAL HIGH (ref 0.350–4.500)

## 2016-11-22 MED ORDER — FLUTICASONE PROPIONATE 50 MCG/ACT NA SUSP
2.0000 | Freq: Every day | NASAL | Status: DC
  Administered 2016-11-22: 2 via NASAL
  Filled 2016-11-22: qty 16

## 2016-11-22 MED ORDER — ONDANSETRON HCL 4 MG PO TABS: 4.0000 mg | ORAL_TABLET | Freq: Four times a day (QID) | ORAL | Status: DC | PRN

## 2016-11-22 MED ORDER — OXYCODONE HCL 5 MG PO TABS
5.0000 mg | ORAL_TABLET | Freq: Four times a day (QID) | ORAL | Status: DC | PRN
  Administered 2016-11-22: 5 mg via ORAL
  Filled 2016-11-22: qty 1

## 2016-11-22 MED ORDER — FUROSEMIDE 20 MG PO TABS
20.0000 mg | ORAL_TABLET | Freq: Every day | ORAL | Status: DC
  Administered 2016-11-22: 20 mg via ORAL
  Filled 2016-11-22: qty 1

## 2016-11-22 MED ORDER — OMEPRAZOLE MAGNESIUM 20 MG PO TBEC: 20.0000 mg | DELAYED_RELEASE_TABLET | Freq: Two times a day (BID) | ORAL | Status: DC

## 2016-11-22 MED ORDER — LEVOTHYROXINE SODIUM 50 MCG PO TABS
175.0000 ug | ORAL_TABLET | Freq: Every day | ORAL | Status: DC
  Administered 2016-11-22: 175 ug via ORAL
  Filled 2016-11-22: qty 1

## 2016-11-22 MED ORDER — VITAMIN D3 25 MCG (1000 UNIT) PO TABS
1000.0000 [IU] | ORAL_TABLET | Freq: Two times a day (BID) | ORAL | Status: DC
  Administered 2016-11-22: 1000 [IU] via ORAL
  Filled 2016-11-22: qty 1

## 2016-11-22 MED ORDER — CARBOXYMETHYLCELLULOSE SODIUM 0.5 % OP SOLN: 1.0000 [drp] | Freq: Two times a day (BID) | OPHTHALMIC | Status: DC

## 2016-11-22 MED ORDER — LORAZEPAM 0.5 MG PO TABS: 0.5000 mg | ORAL_TABLET | Freq: Three times a day (TID) | ORAL | 0 refills | Status: DC

## 2016-11-22 MED ORDER — PSYLLIUM 95 % PO PACK
1.0000 | PACK | Freq: Every day | ORAL | Status: DC
  Administered 2016-11-22: 1 via ORAL
  Filled 2016-11-22: qty 1

## 2016-11-22 MED ORDER — ASPIRIN EC 81 MG PO TBEC
81.0000 mg | DELAYED_RELEASE_TABLET | Freq: Every day | ORAL | Status: DC
  Administered 2016-11-22: 81 mg via ORAL
  Filled 2016-11-22: qty 1

## 2016-11-22 MED ORDER — LATANOPROST 0.005 % OP SOLN
1.0000 [drp] | Freq: Every day | OPHTHALMIC | Status: DC
  Filled 2016-11-22: qty 2.5

## 2016-11-22 MED ORDER — POTASSIUM CHLORIDE CRYS ER 10 MEQ PO TBCR
10.0000 meq | EXTENDED_RELEASE_TABLET | Freq: Every day | ORAL | Status: DC
  Administered 2016-11-22: 10 meq via ORAL
  Filled 2016-11-22: qty 1

## 2016-11-22 MED ORDER — LORAZEPAM 0.5 MG PO TABS
0.5000 mg | ORAL_TABLET | Freq: Three times a day (TID) | ORAL | Status: DC
  Administered 2016-11-22 (×2): 0.5 mg via ORAL
  Filled 2016-11-22 (×2): qty 1

## 2016-11-22 MED ORDER — BUSPIRONE HCL 5 MG PO TABS: 15.0000 mg | ORAL_TABLET | Freq: Every day | ORAL | Status: DC

## 2016-11-22 MED ORDER — ADULT MULTIVITAMIN W/MINERALS CH
1.0000 | ORAL_TABLET | Freq: Every day | ORAL | Status: DC
  Administered 2016-11-22: 1 via ORAL
  Filled 2016-11-22: qty 1

## 2016-11-22 MED ORDER — VITAMIN D (ERGOCALCIFEROL) 1.25 MG (50000 UNIT) PO CAPS: 50000.0000 [IU] | ORAL_CAPSULE | ORAL | Status: DC

## 2016-11-22 MED ORDER — ACETAMINOPHEN 650 MG RE SUPP: 650.0000 mg | Freq: Four times a day (QID) | RECTAL | Status: DC | PRN

## 2016-11-22 MED ORDER — FLUOXETINE HCL 20 MG PO CAPS
20.0000 mg | ORAL_CAPSULE | Freq: Every day | ORAL | Status: DC
  Administered 2016-11-22: 20 mg via ORAL
  Filled 2016-11-22: qty 1

## 2016-11-22 MED ORDER — ROPINIROLE HCL 1 MG PO TABS: 0.5000 mg | ORAL_TABLET | Freq: Every day | ORAL | Status: DC

## 2016-11-22 MED ORDER — DULOXETINE HCL 30 MG PO CPEP
30.0000 mg | ORAL_CAPSULE | Freq: Two times a day (BID) | ORAL | Status: DC
  Administered 2016-11-22: 30 mg via ORAL
  Filled 2016-11-22: qty 1

## 2016-11-22 MED ORDER — DOCUSATE SODIUM 100 MG PO CAPS: 200.0000 mg | ORAL_CAPSULE | Freq: Every day | ORAL | Status: DC

## 2016-11-22 MED ORDER — PREGABALIN 75 MG PO CAPS: 300.0000 mg | ORAL_CAPSULE | Freq: Two times a day (BID) | ORAL | Status: DC

## 2016-11-22 MED ORDER — ONDANSETRON HCL 4 MG/2ML IJ SOLN: 4.0000 mg | Freq: Four times a day (QID) | INTRAMUSCULAR | Status: DC | PRN

## 2016-11-22 MED ORDER — PANCRELIPASE (LIP-PROT-AMYL) 12000-38000 UNITS PO CPEP
24000.0000 [IU] | ORAL_CAPSULE | Freq: Three times a day (TID) | ORAL | Status: DC
  Administered 2016-11-22 (×2): 24000 [IU] via ORAL
  Filled 2016-11-22 (×2): qty 2

## 2016-11-22 MED ORDER — PANTOPRAZOLE SODIUM 40 MG PO TBEC
40.0000 mg | DELAYED_RELEASE_TABLET | Freq: Two times a day (BID) | ORAL | Status: DC
  Administered 2016-11-22: 40 mg via ORAL
  Filled 2016-11-22: qty 1

## 2016-11-22 MED ORDER — LEVOTHYROXINE SODIUM 200 MCG PO TABS: 200.0000 ug | ORAL_TABLET | Freq: Every day | ORAL | Status: DC

## 2016-11-22 MED ORDER — PREGABALIN 50 MG PO CAPS
100.0000 mg | ORAL_CAPSULE | Freq: Two times a day (BID) | ORAL | Status: DC
  Administered 2016-11-22: 100 mg via ORAL
  Filled 2016-11-22: qty 2

## 2016-11-22 MED ORDER — POLYVINYL ALCOHOL 1.4 % OP SOLN
1.0000 [drp] | Freq: Two times a day (BID) | OPHTHALMIC | Status: DC
  Administered 2016-11-22: 1 [drp] via OPHTHALMIC
  Filled 2016-11-22 (×2): qty 15

## 2016-11-22 MED ORDER — ACETAMINOPHEN 325 MG PO TABS: 650.0000 mg | ORAL_TABLET | Freq: Four times a day (QID) | ORAL | Status: DC | PRN

## 2016-11-22 MED ORDER — PROMETHAZINE HCL 25 MG PO TABS: 25.0000 mg | ORAL_TABLET | Freq: Four times a day (QID) | ORAL | Status: DC | PRN

## 2016-11-22 MED ORDER — PRAVASTATIN SODIUM 40 MG PO TABS: 40.0000 mg | ORAL_TABLET | Freq: Every day | ORAL | Status: DC

## 2016-11-22 MED ORDER — ATENOLOL 25 MG PO TABS
25.0000 mg | ORAL_TABLET | Freq: Every morning | ORAL | Status: DC
  Administered 2016-11-22: 25 mg via ORAL
  Filled 2016-11-22: qty 1

## 2016-11-22 MED ORDER — SIMETHICONE 80 MG PO CHEW
125.0000 mg | CHEWABLE_TABLET | Freq: Four times a day (QID) | ORAL | Status: DC | PRN
Start: 2016-11-22 — End: 2016-11-22

## 2016-11-22 MED ORDER — OXYCODONE HCL 5 MG PO TABS: 5.0000 mg | ORAL_TABLET | Freq: Four times a day (QID) | ORAL | 0 refills | Status: DC | PRN

## 2016-11-22 MED ORDER — CALCIUM POLYCARBOPHIL 625 MG PO TABS
625.0000 mg | ORAL_TABLET | Freq: Every day | ORAL | Status: DC
  Administered 2016-11-22: 625 mg via ORAL
  Filled 2016-11-22: qty 1

## 2016-11-22 MED ORDER — VITAMIN B-12 1000 MCG PO TABS
2000.0000 ug | ORAL_TABLET | Freq: Every day | ORAL | Status: DC
  Administered 2016-11-22: 2000 ug via ORAL
  Filled 2016-11-22: qty 2
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
## 2016-11-22 NOTE — Progress Notes (Signed)
CM and CSW Spoke with pt and son at bedside concerning Observation Status and ABN if the pt stay over night. Pt and son agreed with the pt discharging to SNF today. MD agreed also. CSW following.
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
## 2016-11-22 NOTE — Care Management Obs Status (Signed)
[place] NOTIFICATION   Patient Details  Name: [name] MRN: [id number] Date of Birth: 1940/07/20   Medicare Observation Status Notification Given:  Yes    Purcell Mouton, RN 11/22/2016, 3:26 PM

## 2016-11-22 NOTE — Clinical Social Work Placement (Signed)
Patient received and accepted bed offer at [facility]. Patient to be transported by her son. Patient's RN can call report to [phone].  CLINICAL SOCIAL WORK PLACEMENT  NOTE  Date:  11/22/2016  Patient Details  Name: [name] MRN: [id number] Date of Birth: 1940/12/01  Clinical Social Work is seeking post-discharge placement for this patient at the [place] level of care (*CSW will initial, date and re-position this form in  chart as items are completed):  Yes   Patient/family provided with [facility] Work Department's list of facilities offering this level of care within the geographic area requested by the patient (or if unable, by the patient's family).  Yes   Patient/family informed of their freedom to choose among providers that offer the needed level of care, that participate in Medicare, Medicaid or managed care program needed by the patient, have an available bed and are willing to accept the patient.  Yes   Patient/family informed of [facility]'s ownership interest in [facility] and [facility], as well as of the fact that they are under no obligation to receive care at these facilities.  PASRR submitted to EDS on       PASRR number received on       Existing PASRR number confirmed on 11/22/16     FL2 transmitted to all facilities in geographic area requested by pt/family on 11/22/16     FL2 transmitted to all facilities within larger geographic area on       Patient informed that his/her managed care company has contracts with or will negotiate with certain facilities, including the following:        Yes   Patient/family informed of bed offers received.  Patient chooses bed at [facility]     Physician recommends and patient chooses bed at      Patient to be transferred to [facility] on 11/22/16.  Patient to be transferred to facility by [facility] G[facility]     Patient family  notified on 11/22/16 of transfer.  Name of family member notified:  Wendie Agreste     PHYSICIAN       Additional Comment:    _______________________________________________ Burnis Medin, LCSW 11/22/2016, 3:11 PM
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
## 2016-11-22 NOTE — NC FL2 (Signed)
[place] LEVEL OF CARE SCREENING TOOL     IDENTIFICATION  Patient Name: [name] Birthdate: 02/23/1941 Sex: female Admission Date (Current Location): 11/21/2016  [facility] and [place] Number:  Herbalist and Address:  [facility],  [address] [place], [place]      Provider Number: [id number]  Attending Physician Name and Address:  Patrecia Pour, MD  Relative Name and Phone Number:       Current Level of Care: Hospital Recommended Level of Care: [place] Prior Approval Number:    Date Approved/Denied:   PASRR Number: [id number]  Discharge Plan: SNF    Current Diagnoses: Patient Active Problem List   Diagnosis Date Noted  . Closed fracture of ramus of right pubis ([facility]) 11/22/2016  . Fall 11/21/2016  . Open Colles' fracture of right radius 11/02/2016  . Pigmented villonodular synovitis of knee, left 03/22/2016  . Abnormal radionuclide bone scan 08/16/2015  . Ankle fracture, bimalleolar, closed 08/16/2015  . HLD (hyperlipidemia) 07/28/2015  . Fatty liver 09/09/2014  . Lumbago 07/07/2014  . Abnormality of gait 03/27/2014  . Memory loss 01/08/2014  . 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 Paresthesia of foot 06/06/2012  . 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 Colon polyps 06/05/2008  . Osteoarthritis 06/04/2008  . Chronic diarrhea 04/22/2008  . Hypothyroidism 02/26/2007  . Adjustment disorder with mixed anxiety and depressed mood 02/26/2007  . Unspecified glaucoma 02/26/2007  . Essential hypertension 02/26/2007  . Mitral valve disease 02/26/2007  . Allergic rhinitis 02/26/2007  . GERD 02/26/2007  . Hiatal hernia 02/26/2007  . Osteoporosis 02/26/2007    Orientation RESPIRATION BLADDER Height & Weight     Self, Time, Situation, Place  Normal Continent Weight: 150 lb (68  kg) Height:  5\' 1"  (154.9 cm)  BEHAVIORAL SYMPTOMS/MOOD NEUROLOGICAL BOWEL NUTRITION STATUS      Continent Diet (Heart)  AMBULATORY STATUS COMMUNICATION OF NEEDS Skin   Extensive Assist Verbally Normal                       Personal Care Assistance Level of Assistance  Bathing, Feeding, Dressing Bathing Assistance: Limited assistance Feeding assistance: Independent Dressing Assistance: Limited assistance     Functional Limitations Info  Sight, Hearing, Speech Sight Info: Adequate Hearing Info: Adequate Speech Info: Adequate    SPECIAL CARE FACTORS FREQUENCY  PT (By licensed PT), OT (By licensed OT)     PT Frequency: 5x/week OT Frequency: 5x/week            Contractures Contractures Info: Not present    Additional Factors Info  Code Status, Allergies Code Status Info: Full Code Allergies Info: Benzocaine-resorcinol,Celecoxib,Erythromycin Base,Glucosamine,Phenazopyridine Hcl,Sulfa Antibiotics,Metoclopramide Hcl,Niacin,Trovan Alatrofloxacin Mesylate,Nortriptyline,Sulfonamide Derivatives           Current Medications (11/22/2016):  This is the current hospital active medication list Current Facility-Administered Medications  Medication Dose Route Frequency Provider Last Rate Last Dose  . acetaminophen (TYLENOL) tablet 650 mg  650 mg Oral Q6H PRN Rise Patience, MD       Or  . acetaminophen (TYLENOL) suppository 650 mg  650 mg Rectal Q6H PRN Rise Patience, MD      . aspirin EC tablet 81 mg  81 mg Oral Daily Rise Patience, MD   81 mg at 11/22/16 1048  . atenolol (TENORMIN) tablet 25 mg  25 mg Oral q  morning - 10a Rise Patience, MD   25 mg at 11/22/16 1047  . busPIRone (BUSPAR) tablet 15 mg  15 mg Oral QHS Rise Patience, MD      . cholecalciferol (VITAMIN D) tablet 1,000 Units  1,000 Units Oral BID Rise Patience, MD   1,000 Units at 11/22/16 1047  . docusate sodium (COLACE) capsule 200 mg  200 mg Oral QHS Rise Patience,  MD      . DULoxetine (CYMBALTA) DR capsule 30 mg  30 mg Oral BID Rise Patience, MD   30 mg at 11/22/16 1047  . FLUoxetine (PROZAC) capsule 20 mg  20 mg Oral Daily Rise Patience, MD   20 mg at 11/22/16 1048  . fluticasone (FLONASE) 50 MCG/ACT nasal spray 2 spray  2 spray Each Nare Daily Rise Patience, MD   2 spray at 11/22/16 1047  . furosemide (LASIX) tablet 20 mg  20 mg Oral Daily Rise Patience, MD   20 mg at 11/22/16 1048  . latanoprost (XALATAN) 0.005 % ophthalmic solution 1 drop  1 drop Both Eyes QHS Rise Patience, MD      . levothyroxine (SYNTHROID, LEVOTHROID) tablet 175 mcg  175 mcg Oral QAC breakfast Rise Patience, MD   175 mcg at 11/22/16 6578  . lipase/protease/amylase (CREON) capsule 24,000 Units  24,000 Units Oral TID AC Rise Patience, MD   24,000 Units at 11/22/16 1212  . LORazepam (ATIVAN) tablet 0.5 mg  0.5 mg Oral Q8H Rise Patience, MD   0.5 mg at 11/22/16 1432  . multivitamin with minerals tablet 1 tablet  1 tablet Oral Daily Rise Patience, MD   1 tablet at 11/22/16 1047  . ondansetron (ZOFRAN) tablet 4 mg  4 mg Oral Q6H PRN Rise Patience, MD       Or  . ondansetron [facility]) injection 4 mg  4 mg Intravenous Q6H PRN Rise Patience, MD      . oxyCODONE (Oxy IR/ROXICODONE) immediate release tablet 5 mg  5 mg Oral Q6H PRN Rise Patience, MD   5 mg at 11/22/16 1047  . pantoprazole (PROTONIX) EC tablet 40 mg  40 mg Oral BID AC Rise Patience, MD   40 mg at 11/22/16 4696  . polycarbophil (FIBERCON) tablet 625 mg  625 mg Oral Daily Rise Patience, MD   625 mg at 11/22/16 1047  . polyvinyl alcohol (LIQUIFILM TEARS) 1.4 % ophthalmic solution 1 drop  1 drop Both Eyes BID Rise Patience, MD   1 drop at 11/22/16 1432  . potassium chloride (K-DUR,KLOR-CON) CR tablet 10 mEq  10 mEq Oral Daily Rise Patience, MD   10 mEq at 11/22/16 1048  . pravastatin (PRAVACHOL) tablet 40 mg  40 mg Oral  q1800 Rise Patience, MD      . pregabalin (LYRICA) capsule 100 mg  100 mg Oral BID Rise Patience, MD   100 mg at 11/22/16 1048  . promethazine (PHENERGAN) tablet 25 mg  25 mg Oral Q6H PRN Rise Patience, MD      . psyllium (HYDROCIL/METAMUCIL) packet 1 packet  1 packet Oral Daily Rise Patience, MD   1 packet at 11/22/16 1047  . rOPINIRole (REQUIP) tablet 0.5 mg  0.5 mg Oral QHS Rise Patience, MD      . simethicone [facility]) chewable tablet 120 mg  120 mg Oral Q6H PRN Rise Patience, MD      .  vitamin B-12 (CYANOCOBALAMIN) tablet 2,000 mcg  2,000 mcg Oral Daily Rise Patience, MD   2,000 mcg at 11/22/16 1047  . Vitamin D (Ergocalciferol) (DRISDOL) capsule 50,000 Units  50,000 Units Oral Q7 days Rise Patience, MD         Discharge Medications: Please see discharge summary for a list of discharge medications.  Relevant Imaging Results:  Relevant Lab Results:   Additional Information SSN [id number]  Burnis Medin, LCSW

## 2016-11-22 NOTE — Evaluation (Signed)
Physical Therapy Evaluation Patient Details Name: [name] MRN: [id number] DOB: February 15, 1941 Today's Date: 11/22/2016   History of Present Illness  Pt d/c from rehab on 11/21/16 and while son was unlocking house for her to enter and pt got out of car and fell and suffered R pubic rami fx.  PMH: R wrist Fx (June 2018), CVA, MVP, pancreatic CA, R ankle fx, L TKR, L4-S1 back fusion and neuropathy  Clinical Impression  Pt admitted with above diagnosis. Pt currently with functional limitations due to the deficits listed below (see PT Problem List).  Pt will benefit from skilled PT to increase their independence and safety with mobility to allow discharge to the venue listed below.  Pt with recent R wrist fx and NWB with it and WBAT on LE.  Will attempt a platform RW next session.  Recommend SNF.     Follow Up Recommendations SNF;Supervision/Assistance - 24 hour    Equipment Recommendations  None recommended by PT    Recommendations for Other Services       Precautions / Restrictions Precautions Precautions: Fall Precaution Comments: Pt not able to lift anything heavier than a toothbrush with R hand per son Restrictions Weight Bearing Restrictions: Yes RUE Weight Bearing: Non weight bearing RLE Weight Bearing: Weight bearing as tolerated Other Position/Activity Restrictions: OK to use platform RW per hospital notes November 03, 2016      Mobility  Bed Mobility Overal bed mobility: Needs Assistance Bed Mobility: Supine to Sit     Supine to sit: Min guard;HOB elevated     General bed mobility comments: cues to not use R UE  Transfers Overall transfer level: Needs assistance Equipment used: 2 person hand held assist Transfers: Sit to/from [facility] Sit to Stand: Min assist Stand pivot transfers: Min assist       General transfer comment: MIN A to power up and HHA of 1 for SPT  Ambulation/Gait Ambulation/Gait assistance: Min assist;+2 physical  assistance Ambulation Distance (Feet): 4 Feet Assistive device: 2 person hand held assist Gait Pattern/deviations: Decreased stance time - right;Antalgic     General Gait Details: MIN HHA of 2 with support given under R forearm  Stairs            Wheelchair Mobility    Modified Rankin (Stroke Patients Only)       Balance Overall balance assessment: History of Falls Sitting-balance support: Feet supported Sitting balance-Leahy Scale: Fair     Standing balance support: Single extremity supported Standing balance-Leahy Scale: Poor                               Pertinent Vitals/Pain Pain Assessment: 0-10 Pain Score: 6  Pain Location: R pelvis with WB Pain Descriptors / Indicators: Grimacing Pain Intervention(s): Limited activity within patient's tolerance;Monitored during session;Repositioned;Premedicated before session    Home Living Family/patient expects to be discharged to:: Skilled nursing facility                      Prior Function           Comments: Pt was at SNF for rehab and was d/c, then went home and fell. Son reports pt blacking out with falls.     Hand Dominance        Extremity/Trunk Assessment   Upper Extremity Assessment Upper Extremity Assessment: RUE deficits/detail RUE Deficits / Details: R wrist splint and has bone stimulator she was  using on it at time of entry.     Lower Extremity Assessment Lower Extremity Assessment: Overall WFL for tasks assessed RLE: Unable to fully assess due to pain       Communication      Cognition Arousal/Alertness: Awake/alert Behavior During Therapy: WFL for tasks assessed/performed Overall Cognitive Status: Within Functional Limits for tasks assessed Area of Impairment: Memory                     Memory: Decreased short-term memory;Decreased recall of precautions         General Comments: Pt needed cues to not use R UE with scooting and transfers       General Comments      Exercises     Assessment/Plan    PT Assessment Patient needs continued PT services  PT Problem List Decreased activity tolerance;Decreased balance;Decreased mobility;Decreased knowledge of use of DME;Decreased knowledge of precautions       PT Treatment Interventions DME instruction;Gait training;Functional mobility training;Therapeutic activities;Therapeutic exercise;Patient/family education    PT Goals (Current goals can be found in the Care Plan section)  Acute Rehab PT Goals Patient Stated Goal: return to rehab PT Goal Formulation: With family Time For Goal Achievement: 12/06/16 Potential to Achieve Goals: Good    Frequency Min 3X/week   Barriers to discharge Decreased caregiver support      Co-evaluation               AM-PAC PT "6 Clicks" Daily Activity  Outcome Measure Difficulty turning over in bed (including adjusting bedclothes, sheets and blankets)?: A Little Difficulty moving from lying on back to sitting on the side of the bed? : A Little Difficulty sitting down on and standing up from a chair with arms (e.g., wheelchair, bedside commode, etc,.)?: Total Help needed moving to and from a bed to chair (including a wheelchair)?: A Little Help needed walking in hospital room?: A Lot Help needed climbing 3-5 steps with a railing? : A Lot 6 Click Score: 14    End of Session Equipment Utilized During Treatment: Gait belt Activity Tolerance: Patient limited by pain Patient left: in chair;with call bell/phone within reach;with chair alarm set;with family/visitor present Nurse Communication: Mobility status PT Visit Diagnosis: Repeated falls (R29.6);Other abnormalities of gait and mobility (R26.89);Unsteadiness on feet (R26.81)    Time: 6812-7517 PT Time Calculation (min) (ACUTE ONLY): 18 min   Charges:   PT Evaluation $PT Eval Low Complexity: 1 Procedure     PT G Codes:   PT G-Codes **NOT FOR INPATIENT CLASS** Functional  Assessment Tool Used: AM-PAC 6 Clicks Basic Mobility;Clinical judgement Functional Limitation: Mobility: Walking and moving around Mobility: Walking and Moving Around Current Status (G0174): At least 40 percent but less than 60 percent impaired, limited or restricted Mobility: Walking and Moving Around Goal Status [phone]): At least 20 percent but less than 40 percent impaired, limited or restricted    Santiago Glad L. Tamala Julian, [place] Pager [phone] 11/22/2016   Galen Manila 11/22/2016, 1:17 PM

## 2016-11-22 NOTE — Progress Notes (Signed)
Patient admitted early this morning, please see H&P for full details.   She is a 76 y.o. female female with recent right wrist fracture, discharged from rehabilitation 6/25 who had 2 unwitnessed falls, once at rehab and another at home and right anterior hip pain prompting ED visit on 6/25. Imaging showed right inferior nondisplaced pubic ramus fracture and she was brought in for observation. She reports to me that she had LOC and no antecedent symptoms prior to fall. She was not confused, but woke up on the ground and was helped to the car by her son. She's not been eating very well due to limited appetite at rehab the last few days, but denies nausea, vomiting, abdominal pain. No chest pain, dyspnea or palpitations either.   BP (!) 121/55 (BP Location: Right Leg)   Pulse 77   Temp 97.5 F (36.4 C) (Oral)   Resp 17   Ht 5\' 1"  (1.549 m)   Wt 68 kg (150 lb)   SpO2 98%   BMI 28.34 kg/m   Well-appearing elderly female in no distress RRR, no murmur, JVD, or LE edema Nonlabored, clear Abd benign Tenderness to palpation across right anterolateral hip without deformity, NVI distally.   Closed, nondisplaced fracture of right inferior pubic ramus following fall at home:  - Oxycodone prn pain - Therapy evaluation pending - Discussed with CSW, may require placement as she will be unlikely to support herself at home alone  Fall: Pt reporting quick onset/offset LOC without preceding symptoms or postictal symptoms most consistent with syncope, though also in the setting of decreased po intake, so possibly dehydration also possible. - No telemetric events since started cardiac monitoring last night. Noted holter monitoring in March 2018 (for palpitations) showing only isolated PACs and PVCs.  - PT as above. - I've discussed this with her son who reports some lethargy ever since the initial accident earlier this month. Possibly from pain and/or pain medications/delirium from unfamiliar setting. Will  check ammonia (history of hepatic steatosis and s/p whipple), TSH, RPR, B12 (history of neuropathy without diabetes)  Headache: CT head without skull fracture, bleeding, infarct. - Monitor with pain control as above.  Otherwise, per H&P.  Vance Gather, MD 11/22/2016 11:09 AM

## 2016-11-23 DIAGNOSIS — G2581 Restless legs syndrome: Secondary | ICD-10-CM | POA: Diagnosis not present

## 2016-11-23 DIAGNOSIS — S329XXA Fracture of unspecified parts of lumbosacral spine and pelvis, initial encounter for closed fracture: Secondary | ICD-10-CM | POA: Diagnosis not present

## 2016-11-23 DIAGNOSIS — W19XXXD Unspecified fall, subsequent encounter: Secondary | ICD-10-CM | POA: Diagnosis not present

## 2016-11-23 DIAGNOSIS — C259 Malignant neoplasm of pancreas, unspecified: Secondary | ICD-10-CM | POA: Diagnosis not present

## 2016-11-23 DIAGNOSIS — G629 Polyneuropathy, unspecified: Secondary | ICD-10-CM | POA: Diagnosis not present

## 2016-11-23 DIAGNOSIS — M1990 Unspecified osteoarthritis, unspecified site: Secondary | ICD-10-CM | POA: Diagnosis not present

## 2016-11-23 DIAGNOSIS — R2689 Other abnormalities of gait and mobility: Secondary | ICD-10-CM | POA: Diagnosis not present

## 2016-11-23 DIAGNOSIS — S62101A Fracture of unspecified carpal bone, right wrist, initial encounter for closed fracture: Secondary | ICD-10-CM | POA: Diagnosis not present

## 2016-11-23 DIAGNOSIS — K8689 Other specified diseases of pancreas: Secondary | ICD-10-CM | POA: Diagnosis not present

## 2016-11-23 DIAGNOSIS — F0390 Unspecified dementia without behavioral disturbance: Secondary | ICD-10-CM | POA: Diagnosis not present

## 2016-11-23 DIAGNOSIS — F322 Major depressive disorder, single episode, severe without psychotic features: Secondary | ICD-10-CM | POA: Diagnosis not present

## 2016-11-23 DIAGNOSIS — R531 Weakness: Secondary | ICD-10-CM | POA: Diagnosis not present

## 2016-11-23 DIAGNOSIS — S6291XD Unspecified fracture of right wrist and hand, subsequent encounter for fracture with routine healing: Secondary | ICD-10-CM | POA: Diagnosis not present

## 2016-11-23 DIAGNOSIS — S32501D Unspecified fracture of right pubis, subsequent encounter for fracture with routine healing: Secondary | ICD-10-CM | POA: Diagnosis not present

## 2016-11-23 LAB — RPR: RPR: NONREACTIVE

## 2016-11-23 LAB — HEMOGLOBIN A1C
HEMOGLOBIN A1C: 4.3 % — AB (ref 4.8–5.6)
MEAN PLASMA GLUCOSE: 77 mg/dL

## 2016-11-23 LAB — T3, FREE: T3, Free: 2.1 pg/mL (ref 2.0–4.4)

## 2016-11-23 LAB — HIV ANTIBODY (ROUTINE TESTING W REFLEX): HIV Screen 4th Generation wRfx: NONREACTIVE

## 2016-11-25 DIAGNOSIS — M65831 Other synovitis and tenosynovitis, right forearm: Secondary | ICD-10-CM | POA: Diagnosis not present

## 2016-11-25 DIAGNOSIS — H409 Unspecified glaucoma: Secondary | ICD-10-CM | POA: Diagnosis not present

## 2016-11-25 DIAGNOSIS — R262 Difficulty in walking, not elsewhere classified: Secondary | ICD-10-CM | POA: Diagnosis not present

## 2016-11-25 DIAGNOSIS — F329 Major depressive disorder, single episode, unspecified: Secondary | ICD-10-CM | POA: Diagnosis not present

## 2016-11-25 DIAGNOSIS — Y831 Surgical operation with implant of artificial internal device as the cause of abnormal reaction of the patient, or of later complication, without mention of misadventure at the time of the procedure: Secondary | ICD-10-CM | POA: Diagnosis not present

## 2016-11-25 DIAGNOSIS — S32591A Other specified fracture of right pubis, initial encounter for closed fracture: Secondary | ICD-10-CM | POA: Diagnosis not present

## 2016-11-25 DIAGNOSIS — Z4789 Encounter for other orthopedic aftercare: Secondary | ICD-10-CM | POA: Diagnosis not present

## 2016-11-25 DIAGNOSIS — M6281 Muscle weakness (generalized): Secondary | ICD-10-CM | POA: Diagnosis not present

## 2016-11-25 DIAGNOSIS — M79601 Pain in right arm: Secondary | ICD-10-CM | POA: Diagnosis not present

## 2016-11-25 DIAGNOSIS — Z472 Encounter for removal of internal fixation device: Secondary | ICD-10-CM | POA: Diagnosis not present

## 2016-11-25 DIAGNOSIS — R5381 Other malaise: Secondary | ICD-10-CM | POA: Diagnosis not present

## 2016-11-25 DIAGNOSIS — K219 Gastro-esophageal reflux disease without esophagitis: Secondary | ICD-10-CM | POA: Diagnosis not present

## 2016-11-25 DIAGNOSIS — G609 Hereditary and idiopathic neuropathy, unspecified: Secondary | ICD-10-CM | POA: Diagnosis not present

## 2016-11-25 DIAGNOSIS — R488 Other symbolic dysfunctions: Secondary | ICD-10-CM | POA: Diagnosis not present

## 2016-11-25 DIAGNOSIS — M25531 Pain in right wrist: Secondary | ICD-10-CM | POA: Diagnosis not present

## 2016-11-25 DIAGNOSIS — R29898 Other symptoms and signs involving the musculoskeletal system: Secondary | ICD-10-CM | POA: Diagnosis not present

## 2016-11-25 DIAGNOSIS — S52571E Other intraarticular fracture of lower end of right radius, subsequent encounter for open fracture type I or II with routine healing: Secondary | ICD-10-CM | POA: Diagnosis not present

## 2016-11-25 DIAGNOSIS — R413 Other amnesia: Secondary | ICD-10-CM | POA: Diagnosis not present

## 2016-11-25 DIAGNOSIS — T8489XA Other specified complication of internal orthopedic prosthetic devices, implants and grafts, initial encounter: Secondary | ICD-10-CM | POA: Diagnosis not present

## 2016-11-25 DIAGNOSIS — Z8507 Personal history of malignant neoplasm of pancreas: Secondary | ICD-10-CM | POA: Diagnosis not present

## 2016-11-25 DIAGNOSIS — J309 Allergic rhinitis, unspecified: Secondary | ICD-10-CM | POA: Diagnosis not present

## 2016-11-25 DIAGNOSIS — R41841 Cognitive communication deficit: Secondary | ICD-10-CM | POA: Diagnosis not present

## 2016-11-25 DIAGNOSIS — T84112A Breakdown (mechanical) of internal fixation device of bone of right forearm, initial encounter: Secondary | ICD-10-CM | POA: Diagnosis not present

## 2016-11-25 DIAGNOSIS — R296 Repeated falls: Secondary | ICD-10-CM | POA: Diagnosis not present

## 2016-11-25 DIAGNOSIS — Z96652 Presence of left artificial knee joint: Secondary | ICD-10-CM | POA: Diagnosis not present

## 2016-11-25 DIAGNOSIS — S32591S Other specified fracture of right pubis, sequela: Secondary | ICD-10-CM | POA: Diagnosis not present

## 2016-11-25 DIAGNOSIS — Z7982 Long term (current) use of aspirin: Secondary | ICD-10-CM | POA: Diagnosis not present

## 2016-11-25 DIAGNOSIS — R278 Other lack of coordination: Secondary | ICD-10-CM | POA: Diagnosis not present

## 2016-11-25 DIAGNOSIS — Z79899 Other long term (current) drug therapy: Secondary | ICD-10-CM | POA: Diagnosis not present

## 2016-11-25 DIAGNOSIS — K8681 Exocrine pancreatic insufficiency: Secondary | ICD-10-CM | POA: Diagnosis not present

## 2016-11-25 DIAGNOSIS — Z791 Long term (current) use of non-steroidal anti-inflammatories (NSAID): Secondary | ICD-10-CM | POA: Diagnosis not present

## 2016-11-25 DIAGNOSIS — R2689 Other abnormalities of gait and mobility: Secondary | ICD-10-CM | POA: Diagnosis not present

## 2016-11-25 DIAGNOSIS — Z9181 History of falling: Secondary | ICD-10-CM | POA: Diagnosis not present

## 2016-11-25 DIAGNOSIS — I10 Essential (primary) hypertension: Secondary | ICD-10-CM | POA: Diagnosis not present

## 2016-11-25 DIAGNOSIS — Z8673 Personal history of transient ischemic attack (TIA), and cerebral infarction without residual deficits: Secondary | ICD-10-CM | POA: Diagnosis not present

## 2016-11-25 DIAGNOSIS — E039 Hypothyroidism, unspecified: Secondary | ICD-10-CM | POA: Diagnosis not present

## 2016-11-28 ENCOUNTER — Ambulatory Visit: Payer: Medicare Other | Admitting: Family Medicine

## 2016-11-29 DIAGNOSIS — S32591A Other specified fracture of right pubis, initial encounter for closed fracture: Secondary | ICD-10-CM | POA: Diagnosis not present

## 2016-11-29 DIAGNOSIS — R5381 Other malaise: Secondary | ICD-10-CM | POA: Diagnosis not present

## 2016-12-05 DIAGNOSIS — M25531 Pain in right wrist: Secondary | ICD-10-CM | POA: Diagnosis not present

## 2016-12-05 DIAGNOSIS — S52571E Other intraarticular fracture of lower end of right radius, subsequent encounter for open fracture type I or II with routine healing: Secondary | ICD-10-CM | POA: Diagnosis not present

## 2016-12-07 DIAGNOSIS — M79601 Pain in right arm: Secondary | ICD-10-CM | POA: Diagnosis not present

## 2016-12-14 ENCOUNTER — Ambulatory Visit: Payer: Self-pay | Admitting: Orthopedic Surgery

## 2016-12-14 ENCOUNTER — Encounter (HOSPITAL_COMMUNITY): Payer: Self-pay | Admitting: *Deleted

## 2016-12-14 ENCOUNTER — Ambulatory Visit: Payer: Medicare Other | Admitting: Cardiovascular Disease

## 2016-12-14 NOTE — Pre-Procedure Instructions (Addendum)
    [name]  12/14/2016      [id number] [place], [place] [address] [place] [address] Phone: [phone] Fax: [phone]  [place], [place] [facility] [address] [place] [address] Phone: [phone] Fax: [phone]    Your procedure is scheduled on Thursday, December 15, 2016.  Report to [facility] Admitting at 4:00 P.M.  Call this number if you have problems the morning of surgery:  [phone]   Remember:  Do not eat any food after having breakfast ( light breakfast only- no fried or fatty foods).  Can have clear liquids up until 12:00 PM on day of surgery (per MD)  Take these medicines the morning of surgery with A SIP OF WATER: atenolol (TENORMIN),  DULoxetine (CYMBALTA), levothyroxine (SYNTHROID), LORazepam (ATIVAN), omeprazole (PRILOSEC ), pregabalin (LYRICA), REFRESH TEARS eye drops, fluticasone (FLONASE) nasal spray, if needed: Oxycodone for pain, Phenergan for nausea and vomiting  Stop taking Aspirin, all vitamins, fish oil and herbal medications. Do not take any NSAIDs ie: Ibuprofen, Advil, Naproxen (Naprosyn), Aleve, Motrin, BC and Goody Powder or any medication containing Aspirin; stop now.  Do not wear jewelry, make-up or nail polish.  Do not wear lotions, powders, or perfumes, or deoderant.  Do not shave 48 hours prior to surgery.    Do not bring valuables to the hospital.  [facility] is not responsible for any belongings or valuables.  Contacts, dentures or bridgework may not be worn into surgery.  Leave your suitcase in the car.  After surgery it may be brought to your room.  For patients admitted to the hospital, discharge time will be determined by your treatment team.  Patients discharged the day of surgery will not be allowed to drive home.  Please read over the following fact sheets that you were given.

## 2016-12-14 NOTE — Progress Notes (Signed)
Please complete Anesthesia Assessment and Apnea Screening  with pt on DOS. Pt is a resident of [facility]. Pt nurse, Janett Billow, LPN, completed pt SDW-pre-op call. Janett Billow denies that pt C/O SOB and chest pain. Janett Billow denies that pt is under the care of a cardiologist. Dr. Kalman Shan, Anesthesia, advised that pt eat a " light breakfast only and can have clears up until noon." Janett Billow confirmed receipt of fax and verbalized understanding of all pre-op instructions.

## 2016-12-15 ENCOUNTER — Ambulatory Visit (HOSPITAL_COMMUNITY): Payer: Medicare Other | Admitting: Certified Registered Nurse Anesthetist

## 2016-12-15 ENCOUNTER — Encounter (HOSPITAL_COMMUNITY): Payer: Self-pay | Admitting: *Deleted

## 2016-12-15 ENCOUNTER — Observation Stay (HOSPITAL_COMMUNITY)
Admission: RE | Admit: 2016-12-15 | Discharge: 2016-12-16 | Disposition: A | Discharge status: Home / Self Care | Payer: Medicare Other | Source: Ambulatory Visit | Attending: Orthopedic Surgery | Admitting: Orthopedic Surgery

## 2016-12-15 ENCOUNTER — Encounter (HOSPITAL_COMMUNITY)
Admission: RE | Disposition: A | Discharge status: Home / Self Care | Payer: Self-pay | Source: Ambulatory Visit | Attending: Orthopedic Surgery

## 2016-12-15 DIAGNOSIS — Y831 Surgical operation with implant of artificial internal device as the cause of abnormal reaction of the patient, or of later complication, without mention of misadventure at the time of the procedure: Secondary | ICD-10-CM | POA: Insufficient documentation

## 2016-12-15 DIAGNOSIS — H409 Unspecified glaucoma: Secondary | ICD-10-CM | POA: Diagnosis not present

## 2016-12-15 DIAGNOSIS — F329 Major depressive disorder, single episode, unspecified: Secondary | ICD-10-CM | POA: Insufficient documentation

## 2016-12-15 DIAGNOSIS — Z7982 Long term (current) use of aspirin: Secondary | ICD-10-CM | POA: Diagnosis not present

## 2016-12-15 DIAGNOSIS — Z79899 Other long term (current) drug therapy: Secondary | ICD-10-CM | POA: Insufficient documentation

## 2016-12-15 DIAGNOSIS — Z9181 History of falling: Secondary | ICD-10-CM | POA: Insufficient documentation

## 2016-12-15 DIAGNOSIS — Z96652 Presence of left artificial knee joint: Secondary | ICD-10-CM | POA: Insufficient documentation

## 2016-12-15 DIAGNOSIS — S5291XD Unspecified fracture of right forearm, subsequent encounter for closed fracture with routine healing: Secondary | ICD-10-CM

## 2016-12-15 DIAGNOSIS — I10 Essential (primary) hypertension: Secondary | ICD-10-CM | POA: Insufficient documentation

## 2016-12-15 DIAGNOSIS — R413 Other amnesia: Secondary | ICD-10-CM | POA: Insufficient documentation

## 2016-12-15 DIAGNOSIS — K219 Gastro-esophageal reflux disease without esophagitis: Secondary | ICD-10-CM | POA: Insufficient documentation

## 2016-12-15 DIAGNOSIS — E039 Hypothyroidism, unspecified: Secondary | ICD-10-CM | POA: Insufficient documentation

## 2016-12-15 DIAGNOSIS — Z8673 Personal history of transient ischemic attack (TIA), and cerebral infarction without residual deficits: Secondary | ICD-10-CM | POA: Insufficient documentation

## 2016-12-15 DIAGNOSIS — Z791 Long term (current) use of non-steroidal anti-inflammatories (NSAID): Secondary | ICD-10-CM | POA: Insufficient documentation

## 2016-12-15 DIAGNOSIS — T8489XA Other specified complication of internal orthopedic prosthetic devices, implants and grafts, initial encounter: Secondary | ICD-10-CM | POA: Diagnosis not present

## 2016-12-15 DIAGNOSIS — T84112A Breakdown (mechanical) of internal fixation device of bone of right forearm, initial encounter: Principal | ICD-10-CM | POA: Insufficient documentation

## 2016-12-15 DIAGNOSIS — J309 Allergic rhinitis, unspecified: Secondary | ICD-10-CM | POA: Insufficient documentation

## 2016-12-15 DIAGNOSIS — Z8507 Personal history of malignant neoplasm of pancreas: Secondary | ICD-10-CM | POA: Insufficient documentation

## 2016-12-15 DIAGNOSIS — Z472 Encounter for removal of internal fixation device: Secondary | ICD-10-CM | POA: Diagnosis not present

## 2016-12-15 DIAGNOSIS — M65831 Other synovitis and tenosynovitis, right forearm: Secondary | ICD-10-CM | POA: Diagnosis not present

## 2016-12-15 HISTORY — PX: HARDWARE REMOVAL: SHX979

## 2016-12-15 LAB — CBC
HEMATOCRIT: 41.6 % (ref 36.0–46.0)
HEMOGLOBIN: 13.2 g/dL (ref 12.0–15.0)
MCH: 31.1 pg (ref 26.0–34.0)
MCHC: 31.7 g/dL (ref 30.0–36.0)
MCV: 97.9 fL (ref 78.0–100.0)
Platelets: 153 10*3/uL (ref 150–400)
RBC: 4.25 MIL/uL (ref 3.87–5.11)
RDW: 14.6 % (ref 11.5–15.5)
WBC: 6.4 10*3/uL (ref 4.0–10.5)

## 2016-12-15 LAB — BASIC METABOLIC PANEL
Anion gap: 11 (ref 5–15)
BUN: 13 mg/dL (ref 6–20)
CHLORIDE: 102 mmol/L (ref 101–111)
CO2: 26 mmol/L (ref 22–32)
Calcium: 8.7 mg/dL — ABNORMAL LOW (ref 8.9–10.3)
Creatinine, Ser: 0.82 mg/dL (ref 0.44–1.00)
GFR calc Af Amer: >60 mL/min (ref >60)
GFR calc non Af Amer: >60 mL/min (ref >60)
GLUCOSE: 87 mg/dL (ref 65–99)
POTASSIUM: 3.3 mmol/L — AB (ref 3.5–5.1)
SODIUM: 139 mmol/L (ref 135–145)

## 2016-12-15 SURGERY — REMOVAL, HARDWARE
Anesthesia: Regional | Laterality: Right

## 2016-12-15 MED ORDER — MIDAZOLAM HCL 2 MG/2ML IJ SOLN
1.0000 mg | Freq: Once | INTRAMUSCULAR | Status: AC
  Administered 2016-12-15: 1 mg via INTRAVENOUS

## 2016-12-15 MED ORDER — PROPOFOL 1000 MG/100ML IV EMUL
INTRAVENOUS | Status: AC
  Filled 2016-12-15: qty 100

## 2016-12-15 MED ORDER — LACTATED RINGERS IV SOLN
INTRAVENOUS | Status: DC
  Administered 2016-12-15: 19:00:00 via INTRAVENOUS
  Administered 2016-12-15: 50 mL/h via INTRAVENOUS

## 2016-12-15 MED ORDER — LACTATED RINGERS IV SOLN: INTRAVENOUS | Status: DC

## 2016-12-15 MED ORDER — HYDROCODONE-ACETAMINOPHEN 5-325 MG PO TABS: 1.0000 | ORAL_TABLET | ORAL | Status: DC | PRN

## 2016-12-15 MED ORDER — MEPERIDINE HCL 25 MG/ML IJ SOLN: 6.2500 mg | INTRAMUSCULAR | Status: DC | PRN

## 2016-12-15 MED ORDER — MORPHINE SULFATE (PF) 4 MG/ML IV SOLN: 1.0000 mg | INTRAVENOUS | Status: DC | PRN

## 2016-12-15 MED ORDER — DULOXETINE HCL 30 MG PO CPEP
30.0000 mg | ORAL_CAPSULE | Freq: Two times a day (BID) | ORAL | Status: DC
  Administered 2016-12-16: 30 mg via ORAL
  Filled 2016-12-15: qty 1

## 2016-12-15 MED ORDER — 0.9 % SODIUM CHLORIDE (POUR BTL) OPTIME
TOPICAL | Status: DC | PRN
  Administered 2016-12-15: 1000 mL

## 2016-12-15 MED ORDER — CEFAZOLIN SODIUM-DEXTROSE 1-4 GM/50ML-% IV SOLN
1.0000 g | Freq: Three times a day (TID) | INTRAVENOUS | Status: DC
  Administered 2016-12-16 (×2): 1 g via INTRAVENOUS
  Filled 2016-12-15 (×3): qty 50

## 2016-12-15 MED ORDER — CEFAZOLIN SODIUM-DEXTROSE 1-4 GM/50ML-% IV SOLN
1.0000 g | INTRAVENOUS | Status: DC
  Filled 2016-12-15: qty 50

## 2016-12-15 MED ORDER — FENTANYL CITRATE (PF) 100 MCG/2ML IJ SOLN
INTRAMUSCULAR | Status: DC | PRN
  Administered 2016-12-15 (×2): 50 ug via INTRAVENOUS

## 2016-12-15 MED ORDER — FENTANYL CITRATE (PF) 100 MCG/2ML IJ SOLN: 25.0000 ug | INTRAMUSCULAR | Status: DC | PRN

## 2016-12-15 MED ORDER — BUPIVACAINE-EPINEPHRINE (PF) 0.5% -1:200000 IJ SOLN
INTRAMUSCULAR | Status: DC | PRN
  Administered 2016-12-15: 25 mL via PERINEURAL

## 2016-12-15 MED ORDER — OXYCODONE HCL 5 MG PO TABS
5.0000 mg | ORAL_TABLET | Freq: Four times a day (QID) | ORAL | Status: DC | PRN
  Administered 2016-12-15 – 2016-12-16 (×2): 5 mg via ORAL
  Filled 2016-12-15 (×2): qty 1

## 2016-12-15 MED ORDER — PANTOPRAZOLE SODIUM 40 MG PO TBEC
40.0000 mg | DELAYED_RELEASE_TABLET | Freq: Two times a day (BID) | ORAL | Status: DC
  Administered 2016-12-16: 40 mg via ORAL
  Filled 2016-12-15 (×2): qty 1

## 2016-12-15 MED ORDER — FENTANYL CITRATE (PF) 100 MCG/2ML IJ SOLN
INTRAMUSCULAR | Status: AC
  Administered 2016-12-15: 75 ug via INTRAVENOUS
  Filled 2016-12-15: qty 2

## 2016-12-15 MED ORDER — BUPIVACAINE HCL (PF) 0.25 % IJ SOLN
INTRAMUSCULAR | Status: AC
  Filled 2016-12-15: qty 30

## 2016-12-15 MED ORDER — MIDAZOLAM HCL 2 MG/2ML IJ SOLN
INTRAMUSCULAR | Status: AC
  Administered 2016-12-15: 1 mg via INTRAVENOUS
  Filled 2016-12-15: qty 2

## 2016-12-15 MED ORDER — PROPOFOL 500 MG/50ML IV EMUL
INTRAVENOUS | Status: DC | PRN
  Administered 2016-12-15: 75 ug/kg/min via INTRAVENOUS

## 2016-12-15 MED ORDER — ATENOLOL 50 MG PO TABS
25.0000 mg | ORAL_TABLET | Freq: Every morning | ORAL | Status: DC
  Administered 2016-12-16: 25 mg via ORAL
  Filled 2016-12-15: qty 1

## 2016-12-15 MED ORDER — PREGABALIN 100 MG PO CAPS
100.0000 mg | ORAL_CAPSULE | Freq: Two times a day (BID) | ORAL | Status: DC
Start: 2016-12-15 — End: 2016-12-16
  Administered 2016-12-15 – 2016-12-16 (×2): 100 mg via ORAL
  Filled 2016-12-15 (×2): qty 1

## 2016-12-15 MED ORDER — PROPOFOL 10 MG/ML IV BOLUS
INTRAVENOUS | Status: AC
  Filled 2016-12-15: qty 20

## 2016-12-15 MED ORDER — PROMETHAZINE HCL 25 MG/ML IJ SOLN: 6.2500 mg | INTRAMUSCULAR | Status: DC | PRN

## 2016-12-15 MED ORDER — FENTANYL CITRATE (PF) 100 MCG/2ML IJ SOLN
75.0000 ug | Freq: Once | INTRAMUSCULAR | Status: AC
  Administered 2016-12-15: 75 ug via INTRAVENOUS

## 2016-12-15 MED ORDER — 0.9 % SODIUM CHLORIDE (POUR BTL) OPTIME: TOPICAL | Status: DC | PRN

## 2016-12-15 MED ORDER — DOCUSATE SODIUM 100 MG PO CAPS: 200.0000 mg | ORAL_CAPSULE | Freq: Every day | ORAL | Status: DC

## 2016-12-15 MED ORDER — POTASSIUM CHLORIDE ER 10 MEQ PO TBCR
10.0000 meq | EXTENDED_RELEASE_TABLET | Freq: Every day | ORAL | Status: DC
  Administered 2016-12-16: 10 meq via ORAL
  Filled 2016-12-15 (×3): qty 1

## 2016-12-15 MED ORDER — ONDANSETRON HCL 4 MG/2ML IJ SOLN
INTRAMUSCULAR | Status: DC | PRN
  Administered 2016-12-15: 4 mg via INTRAVENOUS

## 2016-12-15 MED ORDER — VITAMIN B-12 1000 MCG PO TABS
2000.0000 ug | ORAL_TABLET | Freq: Every day | ORAL | Status: DC
  Administered 2016-12-16: 2000 ug via ORAL
  Filled 2016-12-15: qty 2

## 2016-12-15 MED ORDER — FLUOXETINE HCL 20 MG PO CAPS
20.0000 mg | ORAL_CAPSULE | Freq: Every day | ORAL | Status: DC
  Administered 2016-12-16: 20 mg via ORAL
  Filled 2016-12-15: qty 1

## 2016-12-15 MED ORDER — LIDOCAINE 2% (20 MG/ML) 5 ML SYRINGE
INTRAMUSCULAR | Status: DC | PRN
  Administered 2016-12-15: 60 mg via INTRAVENOUS

## 2016-12-15 MED ORDER — CHLORHEXIDINE GLUCONATE 4 % EX LIQD: 60.0000 mL | Freq: Once | CUTANEOUS | Status: DC

## 2016-12-15 MED ORDER — LEVOTHYROXINE SODIUM 100 MCG PO TABS
200.0000 ug | ORAL_TABLET | Freq: Every day | ORAL | Status: DC
  Administered 2016-12-16: 200 ug via ORAL
  Filled 2016-12-15: qty 2

## 2016-12-15 MED ORDER — PANCRELIPASE (LIP-PROT-AMYL) 12000-38000 UNITS PO CPEP
24000.0000 [IU] | ORAL_CAPSULE | Freq: Three times a day (TID) | ORAL | Status: DC
Start: 2016-12-16 — End: 2016-12-16
  Administered 2016-12-16 (×2): 24000 [IU] via ORAL
  Filled 2016-12-15 (×3): qty 2

## 2016-12-15 MED ORDER — CEFAZOLIN SODIUM-DEXTROSE 2-4 GM/100ML-% IV SOLN
2.0000 g | INTRAVENOUS | Status: AC
  Administered 2016-12-15: 2 g via INTRAVENOUS
  Filled 2016-12-15: qty 100

## 2016-12-15 MED ORDER — PROMETHAZINE HCL 25 MG PO TABS
25.0000 mg | ORAL_TABLET | Freq: Four times a day (QID) | ORAL | Status: DC | PRN
  Filled 2016-12-15 (×2): qty 1

## 2016-12-15 MED ORDER — ROPINIROLE HCL 0.5 MG PO TABS
0.5000 mg | ORAL_TABLET | Freq: Every day | ORAL | Status: DC
  Administered 2016-12-15: 0.5 mg via ORAL
  Filled 2016-12-15: qty 1

## 2016-12-15 MED ORDER — PROPOFOL 10 MG/ML IV BOLUS
INTRAVENOUS | Status: DC | PRN
  Administered 2016-12-15: 20 mg via INTRAVENOUS

## 2016-12-15 MED ORDER — LORAZEPAM 0.5 MG PO TABS
0.5000 mg | ORAL_TABLET | Freq: Three times a day (TID) | ORAL | Status: DC
Start: 2016-12-15 — End: 2016-12-16
  Administered 2016-12-15 – 2016-12-16 (×2): 0.5 mg via ORAL
  Filled 2016-12-15 (×2): qty 1

## 2016-12-15 MED ORDER — FENTANYL CITRATE (PF) 250 MCG/5ML IJ SOLN
INTRAMUSCULAR | Status: AC
  Filled 2016-12-15: qty 5

## 2016-12-15 SURGICAL SUPPLY — 54 items
BANDAGE ACE 3X5.8 VEL STRL LF (GAUZE/BANDAGES/DRESSINGS) IMPLANT
BANDAGE ACE 4X5 VEL STRL LF (GAUZE/BANDAGES/DRESSINGS) IMPLANT
BNDG COHESIVE 1X5 TAN STRL LF (GAUZE/BANDAGES/DRESSINGS) IMPLANT
BNDG CONFORM 2 STRL LF (GAUZE/BANDAGES/DRESSINGS) ×3 IMPLANT
BNDG GAUZE ELAST 4 BULKY (GAUZE/BANDAGES/DRESSINGS) ×6 IMPLANT
CAP PIN ORTHO PINK (CAP) IMPLANT
CAP PIN PROTECTOR ORTHO WHT (CAP) IMPLANT
CORDS BIPOLAR (ELECTRODE) ×3 IMPLANT
COVER SURGICAL LIGHT HANDLE (MISCELLANEOUS) ×3 IMPLANT
CUFF TOURNIQUET SINGLE 18IN (TOURNIQUET CUFF) ×3 IMPLANT
CUFF TOURNIQUET SINGLE 24IN (TOURNIQUET CUFF) IMPLANT
DRAPE C-ARM MINI 42X72 WSTRAPS (DRAPES) ×3 IMPLANT
DRAPE SURG 17X23 STRL (DRAPES) ×3 IMPLANT
DRSG ADAPTIC 3X8 NADH LF (GAUZE/BANDAGES/DRESSINGS) ×3 IMPLANT
GAUZE SPONGE 2X2 8PLY STRL LF (GAUZE/BANDAGES/DRESSINGS) IMPLANT
GAUZE SPONGE 4X4 12PLY STRL (GAUZE/BANDAGES/DRESSINGS) ×3 IMPLANT
GAUZE XEROFORM 1X8 LF (GAUZE/BANDAGES/DRESSINGS) ×3 IMPLANT
GLOVE BIO SURGEON STRL SZ 6.5 (GLOVE) ×2 IMPLANT
GLOVE BIO SURGEONS STRL SZ 6.5 (GLOVE) ×1
GLOVE BIOGEL M 8.0 STRL (GLOVE) ×3 IMPLANT
GLOVE BIOGEL PI IND STRL 7.0 (GLOVE) ×2 IMPLANT
GLOVE BIOGEL PI INDICATOR 7.0 (GLOVE) ×4
GLOVE SS BIOGEL STRL SZ 8 (GLOVE) ×1 IMPLANT
GLOVE SUPERSENSE BIOGEL SZ 8 (GLOVE) ×2
GOWN STRL REUS W/ TWL LRG LVL3 (GOWN DISPOSABLE) ×2 IMPLANT
GOWN STRL REUS W/ TWL XL LVL3 (GOWN DISPOSABLE) ×3 IMPLANT
GOWN STRL REUS W/TWL LRG LVL3 (GOWN DISPOSABLE) ×4
GOWN STRL REUS W/TWL XL LVL3 (GOWN DISPOSABLE) ×6
K-WIRE SMTH SNGL TROCAR .028X4 (WIRE)
KIT BASIN OR (CUSTOM PROCEDURE TRAY) ×3 IMPLANT
KIT ROOM TURNOVER OR (KITS) ×3 IMPLANT
KWIRE SMTH SNGL TROCAR .028X4 (WIRE) IMPLANT
NEEDLE HYPO 25GX1X1/2 BEV (NEEDLE) IMPLANT
NS IRRIG 1000ML POUR BTL (IV SOLUTION) ×3 IMPLANT
PACK ORTHO EXTREMITY (CUSTOM PROCEDURE TRAY) ×3 IMPLANT
PAD ARMBOARD 7.5X6 YLW CONV (MISCELLANEOUS) ×6 IMPLANT
PAD CAST 4YDX4 CTTN HI CHSV (CAST SUPPLIES) ×1 IMPLANT
PADDING CAST COTTON 4X4 STRL (CAST SUPPLIES) ×2
SCOTCHCAST PLUS 2X4 WHITE (CAST SUPPLIES) ×3 IMPLANT
SCRUB BETADINE 4OZ XXX (MISCELLANEOUS) ×3 IMPLANT
SLING ARM IMMOBILIZER MED (SOFTGOODS) ×3 IMPLANT
SOL PREP POV-IOD 4OZ 10% (MISCELLANEOUS) ×6 IMPLANT
SPONGE GAUZE 2X2 STER 10/PKG (GAUZE/BANDAGES/DRESSINGS)
STOCKINETTE TUBULAR SYNTH 4IN (CAST SUPPLIES) ×3 IMPLANT
SUT MERSILENE 4 0 P 3 (SUTURE) IMPLANT
SUT PROLENE 4 0 PS 2 18 (SUTURE) ×6 IMPLANT
SUT VIC AB 2-0 CT1 27 (SUTURE) ×2
SUT VIC AB 2-0 CT1 TAPERPNT 27 (SUTURE) ×1 IMPLANT
SYR CONTROL 10ML LL (SYRINGE) IMPLANT
TOWEL OR 17X24 6PK STRL BLUE (TOWEL DISPOSABLE) ×3 IMPLANT
TOWEL OR 17X26 10 PK STRL BLUE (TOWEL DISPOSABLE) ×3 IMPLANT
TUBE CONNECTING 12'X1/4 (SUCTIONS) ×1
TUBE CONNECTING 12X1/4 (SUCTIONS) ×2 IMPLANT
UNDERPAD 30X30 (UNDERPADS AND DIAPERS) ×3 IMPLANT

## 2016-12-15 NOTE — Anesthesia Procedure Notes (Signed)
Anesthesia Regional Block: Supraclavicular block   Pre-Anesthetic Checklist: ,, timeout performed, Correct Patient, Correct Site, Correct Laterality, Correct Procedure, Correct Position, site marked, Risks and benefits discussed,  Surgical consent,  Pre-op evaluation,  At surgeon's request and post-op pain management  Laterality: Right  Prep: chloraprep       Needles:  Injection technique: Single-shot  Needle Type: Echogenic Needle     Needle Length: 9cm  Needle Gauge: 21     Additional Needles:   Procedures: ultrasound guided,,,,,,,,  Narrative:  Start time: 12/15/2016 5:50 PM End time: 12/15/2016 6:00 PM Injection made incrementally with aspirations every 5 mL.  Performed by: Personally  Anesthesiologist: Suella Broad D  Additional Notes: Tolerated well.

## 2016-12-15 NOTE — Op Note (Signed)
See [id number]  Amedeo Plenty MD

## 2016-12-15 NOTE — Anesthesia Preprocedure Evaluation (Addendum)
Anesthesia Evaluation  Patient identified by MRN, date of birth, ID band Patient awake    Reviewed: Allergy & Precautions, NPO status , Patient's Chart, lab work & pertinent test results  Airway Mallampati: I       Dental  (+) Upper Dentures, Lower Dentures   Pulmonary    breath sounds clear to auscultation       Cardiovascular hypertension, Pt. on home beta blockers  Rhythm:Regular Rate:Normal     Neuro/Psych PSYCHIATRIC DISORDERS Depression  Neuromuscular disease CVA    GI/Hepatic hiatal hernia, GERD  Medicated,  Endo/Other  Hypothyroidism   Renal/GU      Musculoskeletal  (+) Arthritis ,   Abdominal   Peds  Hematology   Anesthesia Other Findings   Reproductive/Obstetrics                            Lab Results  Component Value Date   WBC 6.4 12/15/2016   HGB 13.2 12/15/2016   HCT 41.6 12/15/2016   MCV 97.9 12/15/2016   PLT 153 12/15/2016   Lab Results  Component Value Date   CREATININE 0.82 12/15/2016   BUN 13 12/15/2016   NA 139 12/15/2016   K 3.3 (L) 12/15/2016   CL 102 12/15/2016   CO2 26 12/15/2016   Lab Results  Component Value Date   INR 1.28 08/08/2013   INR 1.08 07/31/2013   INR 1.00 07/08/2009   EKG: normal sinus rhythm.  Anesthesia Physical Anesthesia Plan  ASA: III  Anesthesia Plan: Regional   Post-op Pain Management:    Induction: Intravenous  PONV Risk Score and Plan: 2 and Ondansetron and Dexamethasone  Airway Management Planned: Natural Airway  Additional Equipment:   Intra-op Plan:   Post-operative Plan:   Informed Consent: I have reviewed the patients History and Physical, chart, labs and discussed the procedure including the risks, benefits and alternatives for the proposed anesthesia with the patient or authorized representative who has indicated his/her understanding and acceptance.   Dental advisory given  Plan Discussed with:  CRNA  Anesthesia Plan Comments: (Pt and daughter would like to avoid GA and would like block because its "easier on the body and heart", they are happy to have 16-24 hours with no pain.  )      Anesthesia Quick Evaluation

## 2016-12-15 NOTE — Transfer of Care (Signed)
Immediate Anesthesia Transfer of Care Note  Patient: [name]  Procedure(s) Performed: Procedure(s) with comments: Hardware removal and tenolysis right wrist with repair reconstruction as necessary (Right) - 60 mins  Patient Location: PACU  Anesthesia Type:MAC combined with regional for post-op pain  Level of Consciousness: awake, alert  and oriented  Airway & Oxygen Therapy: Patient Spontanous Breathing  Post-op Assessment: Report given to RN and Post -op Vital signs reviewed and stable  Post vital signs: Reviewed and stable  Last Vitals:  Vitals:   12/15/16 1607 12/15/16 1946  BP: (!) 163/58   Pulse: 66   Resp: 18   Temp: 36.8 C 36.6 C    Last Pain:  Vitals:   12/15/16 1946  TempSrc:   PainSc: 0-No pain         Complications: No apparent anesthesia complications

## 2016-12-15 NOTE — H&P (Signed)
[name] is an 76 y.o. female.   Chief Complaint: Fracture radius with presentation for removal of retained hardware. History of multiple falls. HPI: Patient presents for hardware removal and repair is necessary right wrist and hand after a open fracture.  She was treated with ORIF with spanning plate. She's had multiple falls sense in his had fracture about her plate bone interface thus requiring hardware removal T lysis tenosynovitis me. Our plan will be hardware removal and casting today. She understands risk and benefits. She remains neurovascularly intact.  I discussed all issues with she and her family.  Past Medical History:  Diagnosis Date  . Allergic rhinitis   . Cancer ([place]) 07/10/13   Pancreatic cancer with MRI scan 06-19-13  . Chronic maxillary sinusitis    neti pot  . Depression    alone a lot  . Eustachian tube dysfunction   . GERD (gastroesophageal reflux disease)   . Glaucoma   . Heart murmur    hx. "MVP" -predental antibiotics  . Hiatal hernia   . Hypertension   . Hypothyroid   . Memory loss    short term memory loss  . Mitral valve prolapse    antibiotics before dental procedures  . Neuropathy   . Osteoarthritis   . Stroke [facility])    mini storkes left leg paraylsis. patient denies weakness 01/08/14.   . Tardive dyskinesia    possibly reglan, vitamin E helps  . Urgency of urination    some UTI in past    Past Surgical History:  Procedure Laterality Date  . 1 baker cyst removed    . ABDOMINAL HYSTERECTOMY     including ovaries  . BACK SURGERY     fusion  . BLEPHAROPLASTY Bilateral    with cataract surgery  . BREAST EXCISIONAL BIOPSY     left x2  . BREAST SURGERY     Biopsy left 2 times  . COLONOSCOPY W/ POLYPECTOMY     2004 last colonoscopy, no polyps  . DILATION AND CURETTAGE OF UTERUS     x3  . ESOPHAGOGASTRODUODENOSCOPY N/A 09/11/2013   Procedure: ESOPHAGOGASTRODUODENOSCOPY (EGD);  Surgeon: Cleotis Nipper, MD;  Location: [facility] ENDOSCOPY;   Service: Endoscopy;  Laterality: N/A;  Moderate sedation okay if MAC not available  . EUS N/A 07/10/2013   Procedure: ESOPHAGEAL ENDOSCOPIC ULTRASOUND (EUS) RADIAL;  Surgeon: Arta Silence, MD;  Location: [facility] ENDOSCOPY;  Service: Endoscopy;  Laterality: N/A;  . EYE SURGERY Right    cataract  . FINE NEEDLE ASPIRATION N/A 07/10/2013   Procedure: FINE NEEDLE ASPIRATION (FNA) LINEAR;  Surgeon: Arta Silence, MD;  Location: [facility] ENDOSCOPY;  Service: Endoscopy;  Laterality: N/A;  possible fna  . JOINT REPLACEMENT     LTKA  . KNEE SURGERY Left    x 5, total knee Left knee  . LAPAROSCOPY N/A 08/07/2013   Procedure: LAPAROSCOPY DIAGNOSTIC;  Surgeon: [name], MD;  Location: [place];  Service: General;  Laterality: N/A;  . LUMBAR SPINE SURGERY     x2  . LUMBAR SPINE SURGERY     cyst  . ORIF ANKLE FRACTURE Right 08/16/2015   Procedure: OPEN REDUCTION INTERNAL FIXATION (ORIF) ANKLE FRACTURE;  Surgeon: Meredith Pel, MD;  Location: [place];  Service: Orthopedics;  Laterality: Right;  . ORIF WRIST FRACTURE Right 11/01/2016   Procedure: OPEN REDUCTION INTERNAL FIXATION (ORIF) RIGHT WRIST FRACTURE WITH APPLICATION OF SPANNING PLATE, IRRIGATION AND DEBRIDEMENT RIGHT WRIST;  Surgeon: Roseanne Kaufman, MD;  Location: [facility] ORS;  Service:  Orthopedics;  Laterality: Right;  . RADIOACTIVE SEED GUIDED EXCISIONAL BREAST BIOPSY Left 12/15/2015   Procedure: LEFT RADIOACTIVE SEED GUIDED EXCISIONAL BREAST BIOPSY;  Surgeon: [name], MD;  Location: [place];  Service: General;  Laterality: Left;  . WHIPPLE PROCEDURE N/A 08/07/2013   Procedure: WHIPPLE PROCEDURE;  Surgeon: [name], MD;  Location: [facility] OR;  Service: General;  Laterality: N/A;    Family History  Problem Relation Age of Onset  . Cancer Mother        Breast Cancer with Metastatic disease  . Alzheimer's disease Father   . Other Brother 17       GSW   Social History:  reports that she has never smoked. She has never used smokeless tobacco. She reports  that she does not drink alcohol or use drugs.  Allergies:  Allergies  Allergen Reactions  . Other Other (See Comments)    According to patient Trilafor and Reglan cause Tardive Dyskinesia  . Metoclopramide Hcl Other (See Comments)    Shaking; caused tardive dyskinesia  . Niacin Other (See Comments)    shaking  . Trovan [Alatrofloxacin Mesylate] Other (See Comments)    Caused shaking and nervousness  . Benzocaine-Resorcinol Rash  . Celecoxib Other (See Comments)    unknown  . Erythromycin Base Rash  . Glucosamine Other (See Comments)    unknown  . Nortriptyline Other (See Comments)    Dizziness   . Phenazopyridine Hcl Other (See Comments)    unknown  . Sulfa Antibiotics Nausea And Vomiting and Rash  . Sulfonamide Derivatives Nausea And Vomiting and Rash    Medications Prior to Admission  Medication Sig Dispense Refill  . aspirin 81 MG tablet Take 81 mg by mouth daily.    Marland Kitchen atenolol (TENORMIN) 25 MG tablet TAKE 1 TABLET BY MOUTH  EVERY MORNING 90 tablet 1  . busPIRone (BUSPAR) 15 MG tablet Take 15 mg by mouth at bedtime.    . Calcium Carbonate (CALCIUM 600 PO) Take 1 tablet by mouth daily.    . carboxymethylcellulose (REFRESH TEARS) 0.5 % SOLN Place 1 drop into both eyes 2 (two) times daily.     Marland Kitchen docusate sodium (COLACE) 100 MG capsule Take 200 mg by mouth at bedtime.    . DULoxetine (CYMBALTA) 30 MG capsule Take 1 capsule (30 mg total) by mouth 2 (two) times daily. 180 capsule 3  . fluticasone (FLONASE) 50 MCG/ACT nasal spray Place 2 sprays into both nostrils daily. 16 g 6  . furosemide (LASIX) 20 MG tablet Take 1 tablet (20 mg total) by mouth daily. 90 tablet 3  . latanoprost (XALATAN) 0.005 % ophthalmic solution Place 1 drop into both eyes nightly.    . levothyroxine (SYNTHROID, LEVOTHROID) 200 MCG tablet Take 1 tablet (200 mcg total) by mouth daily before breakfast.    . lipase/protease/amylase (CREON) 12000 UNITS CPEP capsule Take 2 capsules (24,000 Units total) by mouth 3  (three) times daily before meals. 270 capsule 5  . LORazepam (ATIVAN) 0.5 MG tablet Take 1 tablet (0.5 mg total) by mouth every 8 (eight) hours. 9 tablet 0  . lovastatin (MEVACOR) 40 MG tablet TAKE 1 TABLET BY MOUTH AT  BEDTIME 90 tablet 1  . Multiple Vitamin (MULTIVITAMIN WITH MINERALS) TABS tablet Take 1 tablet by mouth daily.    . naproxen (NAPROSYN) 500 MG tablet Take 1 tablet (500 mg total) by mouth 2 (two) times daily with a meal. 30 tablet 0  . omeprazole (PRILOSEC OTC) 20 MG tablet Take 20  mg by mouth 2 (two) times daily.     Marland Kitchen oxyCODONE (OXY IR/ROXICODONE) 5 MG immediate release tablet Take 1 tablet (5 mg total) by mouth every 6 (six) hours as needed for severe pain (every 6 to 8 hours as needed for severe pain only). 12 tablet 0  . polycarbophil (FIBERCON) 625 MG tablet Take 625 mg by mouth daily.     . potassium chloride (KLOR-CON 10) 10 MEQ tablet Take 1 tablet (10 mEq total) by mouth daily. 30 tablet 1  . pregabalin (LYRICA) 100 MG capsule Take 1 capsule (100 mg total) by mouth 2 (two) times daily. 60 capsule 3  . psyllium (REGULOID) 0.52 g capsule Take 0.52 g by mouth daily.    Marland Kitchen rOPINIRole (REQUIP) 0.5 MG tablet Take 1 tablet (0.5 mg total) by mouth at bedtime. 30 tablet 6  . simethicone (MYLICON) 121 MG chewable tablet Chew 125 mg by mouth every 6 (six) hours as needed for flatulence.    . vitamin B-12 (CYANOCOBALAMIN) 1000 MCG tablet Take 2,000 mcg by mouth daily.    . Vitamin D, Ergocalciferol, (DRISDOL) 50000 units CAPS capsule Take 1 capsule (50,000 Units total) by mouth every 7 (seven) days. (Patient taking differently: Take 50,000 Units by mouth every 7 (seven) days. On Wednesdays) 10 capsule 0  . vitamin E 400 UNIT capsule Take 800 Units by mouth 2 (two) times daily. For tartive dyskinesia      No results found. However, due to the size of the patient record, not all encounters were searched. Please check Results Review for a complete set of results. No results  found.  Review of Systems  Respiratory: Negative.   Cardiovascular: Negative.   Gastrointestinal: Negative.   Genitourinary: Negative.     Blood pressure (!) 163/58, pulse 66, temperature 98.2 F (36.8 C), temperature source Oral, resp. rate 18, height _0  (1.549 m), weight 67.1 kg (148 lb), SpO2 99 %. Physical Exam incisions well-healed no signs of infection. She has retained hardware with which we will remove today. She has a fracture about the second metacarpal in addition to the radius fracture. I will plan nonsurgical treatment here The patient is alert and oriented in no acute distress. The patient complains of pain in the affected upper extremity.  The patient is noted to have a normal HEENT exam. Lung fields show equal chest expansion and no shortness of breath. Abdomen exam is nontender without distention. Lower extremity examination does not show any fracture dislocation or blood clot symptoms. Pelvis is stable and the neck and back are stable and nontender. Assessment/Plan Will plan for right wrist hardware removal repair reconstruction is necessary  tenolysis and tenosynovectomy   We are planning surgery for your upper extremity. The risk and benefits of surgery to include risk of bleeding, infection, anesthesia,  damage to normal structures and failure of the surgery to accomplish its intended goals of relieving symptoms and restoring function have been discussed in detail. With this in mind we plan to proceed. I have specifically discussed with the patient the pre-and postoperative regime and the dos and don'ts and risk and benefits in great detail. Risk and benefits of surgery also include risk of dystrophy(CRPS), chronic nerve pain, failure of the healing process to go onto completion and other inherent risks of surgery The relavent the pathophysiology of the disease/injury process, as well as the alternatives for treatment and postoperative course of action has been  discussed in great detail with the patient who desires to proceed.  We will do everything in our power to help you (the patient) restore function to the upper extremity. It is a pleasure to see this patient today.  Paulene Floor, MD 12/15/2016, 4:18 PM

## 2016-12-15 NOTE — Anesthesia Procedure Notes (Signed)
Procedure Name: MAC Date/Time: 12/15/2016 7:00 PM Performed by: Teressa Lower Pre-anesthesia Checklist: Emergency Drugs available, Patient identified, Suction available, Patient being monitored and Timeout performed Patient Re-evaluated:Patient Re-evaluated prior to induction Oxygen Delivery Method: Simple face mask

## 2016-12-16 ENCOUNTER — Encounter (HOSPITAL_COMMUNITY): Payer: Self-pay | Admitting: Orthopedic Surgery

## 2016-12-16 DIAGNOSIS — G629 Polyneuropathy, unspecified: Secondary | ICD-10-CM | POA: Diagnosis not present

## 2016-12-16 DIAGNOSIS — R079 Chest pain, unspecified: Secondary | ICD-10-CM | POA: Diagnosis not present

## 2016-12-16 DIAGNOSIS — N390 Urinary tract infection, site not specified: Secondary | ICD-10-CM | POA: Diagnosis not present

## 2016-12-16 DIAGNOSIS — I10 Essential (primary) hypertension: Secondary | ICD-10-CM | POA: Diagnosis not present

## 2016-12-16 DIAGNOSIS — I639 Cerebral infarction, unspecified: Secondary | ICD-10-CM | POA: Diagnosis not present

## 2016-12-16 DIAGNOSIS — E039 Hypothyroidism, unspecified: Secondary | ICD-10-CM | POA: Diagnosis not present

## 2016-12-16 DIAGNOSIS — F329 Major depressive disorder, single episode, unspecified: Secondary | ICD-10-CM | POA: Diagnosis not present

## 2016-12-16 DIAGNOSIS — C259 Malignant neoplasm of pancreas, unspecified: Secondary | ICD-10-CM | POA: Diagnosis not present

## 2016-12-16 DIAGNOSIS — R296 Repeated falls: Secondary | ICD-10-CM | POA: Diagnosis not present

## 2016-12-16 DIAGNOSIS — M6281 Muscle weakness (generalized): Secondary | ICD-10-CM | POA: Diagnosis not present

## 2016-12-16 DIAGNOSIS — K219 Gastro-esophageal reflux disease without esophagitis: Secondary | ICD-10-CM | POA: Diagnosis not present

## 2016-12-16 DIAGNOSIS — S32591S Other specified fracture of right pubis, sequela: Secondary | ICD-10-CM | POA: Diagnosis not present

## 2016-12-16 DIAGNOSIS — S52592S Other fractures of lower end of left radius, sequela: Secondary | ICD-10-CM | POA: Diagnosis not present

## 2016-12-16 DIAGNOSIS — R5381 Other malaise: Secondary | ICD-10-CM | POA: Diagnosis not present

## 2016-12-16 DIAGNOSIS — S5291XA Unspecified fracture of right forearm, initial encounter for closed fracture: Secondary | ICD-10-CM | POA: Diagnosis not present

## 2016-12-16 DIAGNOSIS — R55 Syncope and collapse: Secondary | ICD-10-CM | POA: Diagnosis not present

## 2016-12-16 DIAGNOSIS — H409 Unspecified glaucoma: Secondary | ICD-10-CM | POA: Diagnosis not present

## 2016-12-16 DIAGNOSIS — R262 Difficulty in walking, not elsewhere classified: Secondary | ICD-10-CM | POA: Diagnosis not present

## 2016-12-16 DIAGNOSIS — E785 Hyperlipidemia, unspecified: Secondary | ICD-10-CM | POA: Diagnosis not present

## 2016-12-16 DIAGNOSIS — T84112A Breakdown (mechanical) of internal fixation device of bone of right forearm, initial encounter: Secondary | ICD-10-CM | POA: Diagnosis not present

## 2016-12-16 DIAGNOSIS — Z4789 Encounter for other orthopedic aftercare: Secondary | ICD-10-CM | POA: Diagnosis not present

## 2016-12-16 DIAGNOSIS — J309 Allergic rhinitis, unspecified: Secondary | ICD-10-CM | POA: Diagnosis not present

## 2016-12-16 NOTE — Care Management Obs Status (Signed)
MEDICARE OBSERVATION STATUS NOTIFICATION   Patient Details  Name: [name] MRN: [id number] Date of Birth: 1940/11/26   Medicare Observation Status Notification Given:  Yes    Ninfa Meeker, RN 12/16/2016, 11:27 AM

## 2016-12-16 NOTE — Progress Notes (Addendum)
Patient ID: [name], female   DOB: Dec 08, 1940, 76 y.o.   MRN: [id number] Patient is doing well. VSS  Since still has an active block in her right arm. She states she is ready for the arm to feel normal  She has had 1 catheterization due to retention.  She is tolerating liquids.  She's not been out of bed.  Her fingers are pink warm and I showed her cast.  Her lower extremity examination shows no evidence of DVT infection or compromise. She moves her toes and quadriceps well.  I would recommend we get her out of bed have OT and PT evaluate her. If she is stable I will preliminary only right discharge orders.  I discussed with the patient she is a candidate for DC today if she can void tolerating her diet and demonstrate ambulatory capabilities. I discussed with the patient these issues as well as the nursing staff.  Overall I think she looks great well. I would simply return her to her prior state of affairs at her living facility once she stabilizes completely after her operation. I discussed with her that it may take hours more for her to feel normally in terms of the sensation in her fingers. This is due to the block that was placed by anesthesia  Final diagnosis status post hardware removal right forearm wrist and hand secondary to a comminuted distal radius fracture with prior placement of a spanning wrist plate  Faris Coolman M.D.
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
## 2016-12-16 NOTE — Anesthesia Postprocedure Evaluation (Signed)
Anesthesia Post Note  Patient: [name]  Procedure(s) Performed: Procedure(s) (LRB): Hardware removal and tenolysis right wrist with repair reconstruction as necessary (Right)     Patient location during evaluation: PACU Anesthesia Type: Regional Level of consciousness: awake and alert Pain management: pain level controlled Vital Signs Assessment: post-procedure vital signs reviewed and stable Respiratory status: spontaneous breathing, nonlabored ventilation, respiratory function stable and patient connected to nasal cannula oxygen Cardiovascular status: stable and blood pressure returned to baseline Anesthetic complications: no    Last Vitals:  Vitals:   12/16/16 0025 12/16/16 0500  BP: (!) 151/71 136/71  Pulse: 72 70  Resp: 18 16  Temp: 36.5 C 36.6 C    Last Pain:  Vitals:   12/16/16 0500  TempSrc: Oral  PainSc:                  [name]
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
## 2016-12-16 NOTE — Plan of Care (Signed)
Problem: Pain Managment: Goal: General experience of comfort will improve Patient voices understanding of pain scale and when to call for pain medications.

## 2016-12-16 NOTE — Progress Notes (Signed)
Discharge instructions (including medications) discussed with and copy provided to patient/caregiver. Patient to discharge to daughter, who will transport to [facility].
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
## 2016-12-16 NOTE — Op Note (Signed)
NAME:  GENEVER, HENTGES                     ACCOUNT NO.:  MEDICAL RECORD NO.:  [id number]  LOCATION:                                 FACILITY:  PHYSICIAN:  Satira Anis. Kenneith Stief, M.D.     DATE OF BIRTH:  DATE OF PROCEDURE:12/15/2016 DATE OF DISCHARGE:tbd                              OPERATIVE REPORT   PREOPERATIVE DIAGNOSES:  Comminuted complex open distal radius and ulnar fractures with history of irrigation, debridement, repair with spanning plate and subsequent multiple falls with hardware loosening.  The patient presents for hardware removal, tenolysis, and reconstruction.  POSTOPERATIVE DIAGNOSES:  Comminuted complex open distal radius and ulnar fractures with history of irrigation, debridement, repair with spanning plate and subsequent multiple falls with hardware loosening. The patient presents for hardware removal, tenolysis, and reconstruction.  PROCEDURE: 1. Deep hardware removal, right wrist, forearm, and hand. 2. Extensive radical tenolysis, tenosynovectomy right wrist, forearm,     and hand. 3. AP, lateral, and oblique x-rays performed, examined, interpreted by     myself, right wrist, forearm, and hand.  SURGEON:  Satira Anis. Amedeo Plenty, M.D.  ASSISTANT:  Avelina Laine, P.A.-C.  COMPLICATIONS:  None.  ANESTHESIA:  Block with IV sedation.  TOURNIQUET TIME:  Less than 30 minutes.  INDICATIONS:  This patient is a very pleasant 76 year old female.  She had a really significant fracture process that was open and presented in the wee hours of the morning.  She underwent I and D, spanning wrist plate placement and did very well initially.  Unfortunately, she has had multiple falls since and dislodged portions of her plate and screw.  The patient's family and I discussed this.  I have held the plate in place, although somewhat dislodged to serve as a spanning plate and at present time, she is stable enough that we can take this off.  It is my hope that she has some degree  of malunion that allows for functional arc of motion.  I feel her bony architecture is solid enough to remove the plate, albeit in its dislodged state distally.  OPERATIVE PROCEDURE:  The patient was seen by myself and Anesthesia. Taken to the operative theater, underwent smooth induction of IV sedation, preoperative block was in excellent working fashion.  Once this was complete, we then brought an x-ray and looked at the area.  The patient had loose screws distally.  Proximally, she was well fixed, which was providing the stability.  We made incisions over the proximal screw holes, then the distal screw holes, removed all the screws and performed a tenolysis, tenosynovectomy without difficulty of the extensor apparatus which was intact.  Following this, the plate was removed in its entirety.  We did this through a windowing technique, so that large incisions did not have to be made.  I was pleased with this and the findings.  I then x-rayed her.  She was stable.  There was no looseness at the second metacarpal where she had a fracture and no looseness at the distal radius.  Thus, she had good stability.  At this time, we irrigated copiously, closed the wound, and placed her in a cast short-arm in nature.  She tolerated this well.  We will see her back in the office in 2 weeks, elevate, move, massage fingers, antibiotics overnight, and discharge back to her extended care facility tomorrow. Given the hour, we are going to keep her overnight tonight.  These notes have been discussed.  All questions have been encouraged and answered.     Satira Anis. Amedeo Plenty, M.D.     [facility]  D:  12/15/2016  T:  12/15/2016  Job:  [id number]

## 2016-12-16 NOTE — Social Work (Signed)
Patient a long term resident at [facility].  Per admission, patient can return. Daughter will transport patient to facility.  Elissa Hefty, LCSW Clinical Social Worker [phone]

## 2016-12-16 NOTE — Discharge Instructions (Signed)
We recommend that you to take vitamin C 1000 mg a day to promote healing. °We also recommend that if you require  pain medicine that you take a stool softener to prevent constipation as most pain medicines will have constipation side effects. We recommend either Peri-Colace or Senokot and recommend that you also consider adding MiraLAX as well to prevent the constipation affects from pain medicine if you are required to use them. These medicines are over the counter and may be purchased at a local pharmacy. A cup of yogurt and a probiotic can also be helpful during the recovery process as the medicines can disrupt your intestinal environment. °Keep bandage clean and dry.  Call for any problems.  No smoking.  Criteria for driving a car: you should be off your pain medicine for 7-8 hours, able to drive one handed(confident), thinking clearly and feeling able in your judgement to drive. °Continue elevation as it will decrease swelling.  If instructed by MD move your fingers within the confines of the bandage/splint.  Use ice if instructed by your MD. Call immediately for any sudden loss of feeling in your hand/arm or change in functional abilities of the extremity. °

## 2016-12-16 NOTE — Care Management Obs Status (Signed)
[place] NOTIFICATION   Patient Details  Name: [name] MRN: [id number] Date of Birth: Sep 14, 1940   Medicare Observation Status Notification Given:  Yes Patient was unable to sign d/t surgery. CM read note  And patient agreed.   Ninfa Meeker, RN 12/16/2016, 4:52 PM

## 2016-12-19 ENCOUNTER — Telehealth: Payer: Self-pay | Admitting: Cardiovascular Disease

## 2016-12-19 DIAGNOSIS — N390 Urinary tract infection, site not specified: Secondary | ICD-10-CM | POA: Diagnosis not present
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
## 2016-12-19 NOTE — Telephone Encounter (Signed)
Please call,pt have been feeling and blacking out per son-I made an appointment for tomorrow.

## 2016-12-20 ENCOUNTER — Ambulatory Visit (INDEPENDENT_AMBULATORY_CARE_PROVIDER_SITE_OTHER): Payer: Medicare Other | Admitting: Physician Assistant

## 2016-12-20 ENCOUNTER — Encounter: Payer: Self-pay | Admitting: Physician Assistant

## 2016-12-20 VITALS — BP 136/72 | HR 66 | Ht 61.0 in | Wt 148.2 lb

## 2016-12-20 DIAGNOSIS — R079 Chest pain, unspecified: Secondary | ICD-10-CM | POA: Diagnosis not present

## 2016-12-20 DIAGNOSIS — E785 Hyperlipidemia, unspecified: Secondary | ICD-10-CM

## 2016-12-20 DIAGNOSIS — I10 Essential (primary) hypertension: Secondary | ICD-10-CM

## 2016-12-20 DIAGNOSIS — E039 Hypothyroidism, unspecified: Secondary | ICD-10-CM

## 2016-12-20 DIAGNOSIS — R55 Syncope and collapse: Secondary | ICD-10-CM

## 2016-12-20 DIAGNOSIS — C259 Malignant neoplasm of pancreas, unspecified: Secondary | ICD-10-CM
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
## 2016-12-20 NOTE — Patient Instructions (Signed)
Medication Instructions:  Your physician recommends that you continue on your current medications as directed. Please refer to the Current Medication list given to you today.  Labwork: NONE  Testing/Procedures: Your physician has recommended that you wear a 30 DAY event monitor. Event monitors are medical devices that record the heart's electrical activity. Doctors most often [place] these monitors to diagnose arrhythmias. Arrhythmias are problems with the speed or rhythm of the heartbeat. The monitor is a small, portable device. You can wear one while you do your normal daily activities. This is usually used to diagnose what is causing palpitations/syncope (passing out).  -this will be placed at our [facility]: [place]: Your physician recommends that you schedule a follow-up appointment in: 2 months with Dr. Oval Linsey.    Any Other Special Instructions Will Be Listed Below (If Applicable).     If you need a refill on your cardiac medications before your next appointment, please call your pharmacy.

## 2016-12-21 ENCOUNTER — Encounter: Payer: Self-pay | Admitting: Physician Assistant

## 2016-12-22 DIAGNOSIS — S5291XA Unspecified fracture of right forearm, initial encounter for closed fracture: Secondary | ICD-10-CM | POA: Diagnosis not present

## 2016-12-22 DIAGNOSIS — R5381 Other malaise: Secondary | ICD-10-CM | POA: Diagnosis not present

## 2016-12-22 DIAGNOSIS — I639 Cerebral infarction, unspecified: Secondary | ICD-10-CM | POA: Diagnosis not present

## 2016-12-22 DIAGNOSIS — I10 Essential (primary) hypertension: Secondary | ICD-10-CM | POA: Diagnosis not present

## 2016-12-26 DIAGNOSIS — G629 Polyneuropathy, unspecified: Secondary | ICD-10-CM | POA: Diagnosis not present

## 2016-12-26 DIAGNOSIS — S5291XA Unspecified fracture of right forearm, initial encounter for closed fracture: Secondary | ICD-10-CM | POA: Diagnosis not present

## 2016-12-29 DIAGNOSIS — T8489XD Other specified complication of internal orthopedic prosthetic devices, implants and grafts, subsequent encounter: Secondary | ICD-10-CM | POA: Diagnosis not present

## 2016-12-29 DIAGNOSIS — M65831 Other synovitis and tenosynovitis, right forearm: Secondary | ICD-10-CM | POA: Diagnosis not present

## 2017-01-04 ENCOUNTER — Encounter (HOSPITAL_COMMUNITY): Payer: Self-pay | Admitting: *Deleted

## 2017-01-04 ENCOUNTER — Ambulatory Visit (INDEPENDENT_AMBULATORY_CARE_PROVIDER_SITE_OTHER): Payer: Medicare Other

## 2017-01-04 ENCOUNTER — Emergency Department (HOSPITAL_COMMUNITY)
Admission: EM | Admit: 2017-01-04 | Discharge: 2017-01-05 | Disposition: A | Discharge status: Home / Self Care | Payer: Medicare Other | Source: Home / Self Care | Attending: Emergency Medicine | Admitting: Emergency Medicine

## 2017-01-04 DIAGNOSIS — S8012XA Contusion of left lower leg, initial encounter: Secondary | ICD-10-CM | POA: Diagnosis not present

## 2017-01-04 DIAGNOSIS — Y998 Other external cause status: Secondary | ICD-10-CM

## 2017-01-04 DIAGNOSIS — S0990XA Unspecified injury of head, initial encounter: Secondary | ICD-10-CM | POA: Diagnosis not present

## 2017-01-04 DIAGNOSIS — R55 Syncope and collapse: Secondary | ICD-10-CM

## 2017-01-04 DIAGNOSIS — Y9389 Activity, other specified: Secondary | ICD-10-CM

## 2017-01-04 DIAGNOSIS — S199XXA Unspecified injury of neck, initial encounter: Secondary | ICD-10-CM | POA: Diagnosis not present

## 2017-01-04 DIAGNOSIS — W0110XA Fall on same level from slipping, tripping and stumbling with subsequent striking against unspecified object, initial encounter: Secondary | ICD-10-CM | POA: Insufficient documentation

## 2017-01-04 DIAGNOSIS — S022XXA Fracture of nasal bones, initial encounter for closed fracture: Secondary | ICD-10-CM | POA: Insufficient documentation

## 2017-01-04 DIAGNOSIS — Z8673 Personal history of transient ischemic attack (TIA), and cerebral infarction without residual deficits: Secondary | ICD-10-CM

## 2017-01-04 DIAGNOSIS — Y9289 Other specified places as the place of occurrence of the external cause: Secondary | ICD-10-CM | POA: Insufficient documentation

## 2017-01-04 DIAGNOSIS — S32511A Fracture of superior rim of right pubis, initial encounter for closed fracture: Secondary | ICD-10-CM | POA: Diagnosis not present

## 2017-01-04 DIAGNOSIS — W19XXXA Unspecified fall, initial encounter: Secondary | ICD-10-CM

## 2017-01-04 DIAGNOSIS — Z7982 Long term (current) use of aspirin: Secondary | ICD-10-CM | POA: Insufficient documentation

## 2017-01-04 DIAGNOSIS — Z79899 Other long term (current) drug therapy: Secondary | ICD-10-CM | POA: Insufficient documentation

## 2017-01-04 DIAGNOSIS — I10 Essential (primary) hypertension: Secondary | ICD-10-CM | POA: Insufficient documentation

## 2017-01-04 NOTE — ED Provider Notes (Signed)
[facility] DEPT Provider Note   CSN: [id number] Arrival date & time: 01/04/17  1256   By signing my name below, I, Ny'Kea Lewis, attest that this documentation has been prepared under the direction and in the presence of Avie Echevaria, PA-C.  Electronically Signed: Lise Auer, ED Scribe. 01/05/17. 12:13 AM.  History   Chief Complaint Chief Complaint  Patient presents with  . Fall   The history is provided by the patient. No language interpreter was used.    HPI Comments: [name] is a 76 y.o. female with a PMHx of HTN and CVA,  who presents to the Emergency Department for evaluation s/p mechanical fall that occurred PTA. She notes associated mild neck pain, intermittent frontal headache that radiates into the base of her neck, increased difficulty with ambulation, and right hip pain. She endorses a history of headaches, but states this headache is intermittent and dull and less severe than her regular headaches. Pt states she was using the restroom and once she stood up to pull her pants up, the automatic bathroom light went off She attempted to grab her wheelchair and fell face first onto the floor of the restroom, and landing on her nose. She denies loss of consciousness. Pt reports she has been ambulatory since the incident but she has noticed more difficulty with ambulation.  Pt states she had a pelvic fracture from a previous fall which was the onset of her right hip pain, but she endorses the pain has worsened since her fall today. At this time she notes having a "lingering" headache. She denies nausea, vomiting, visual disturbance, dizziness, numbness or weakness of her extremities.    Past Medical History:  Diagnosis Date  . Allergic rhinitis   . Cancer ([place]) 07/10/13   Pancreatic cancer with MRI scan 06-19-13  . Chronic maxillary sinusitis    neti pot  . Depression    alone a lot  . Eustachian tube dysfunction   . GERD (gastroesophageal reflux disease)   . Glaucoma    . Heart murmur    hx. "MVP" -predental antibiotics  . Hiatal hernia   . Hypertension   . Hypothyroid   . Memory loss    short term memory loss  . Mitral valve prolapse    antibiotics before dental procedures  . Neuropathy   . Osteoarthritis   . Stroke [facility])    mini storkes left leg paraylsis. patient denies weakness 01/08/14.   . Tardive dyskinesia    possibly reglan, vitamin E helps  . Urgency of urination    some UTI in past    Patient Active Problem List   Diagnosis Date Noted  . Aftercare for healing traumatic fracture of radius, right, closed 12/15/2016  . Closed fracture of ramus of right pubis ([place]) 11/22/2016  . Fall 11/21/2016  . Open Colles' fracture of right radius 11/02/2016  . Pigmented villonodular synovitis of knee, left 03/22/2016  . Abnormal radionuclide bone scan 08/16/2015  . Ankle fracture, bimalleolar, closed 08/16/2015  . HLD (hyperlipidemia) 07/28/2015  . Fatty liver 09/09/2014  . Lumbago 07/07/2014  . Abnormality of gait 03/27/2014  . Memory loss 01/08/2014  . 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 Carcinoma of head of pancreas ([place]) 07/01/2013  . 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 Paresthesia of foot 06/06/2012  . 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 Colon polyps 06/05/2008  . Osteoarthritis 06/04/2008  . Chronic diarrhea 04/22/2008  . Hypothyroidism 02/26/2007  . Adjustment disorder with mixed anxiety and depressed  mood 02/26/2007  . Unspecified glaucoma 02/26/2007  . Essential hypertension 02/26/2007  . Mitral valve disease 02/26/2007  . Allergic rhinitis 02/26/2007  . GERD 02/26/2007  . Hiatal hernia 02/26/2007  . Osteoporosis 02/26/2007    Past Surgical History:  Procedure Laterality Date  . 1 baker cyst removed    . ABDOMINAL HYSTERECTOMY     including ovaries  . BACK SURGERY     fusion  . BLEPHAROPLASTY Bilateral    with cataract surgery  . BREAST EXCISIONAL BIOPSY     left x2  . BREAST  SURGERY     Biopsy left 2 times  . COLONOSCOPY W/ POLYPECTOMY     2004 last colonoscopy, no polyps  . DILATION AND CURETTAGE OF UTERUS     x3  . ESOPHAGOGASTRODUODENOSCOPY N/A 09/11/2013   Procedure: ESOPHAGOGASTRODUODENOSCOPY (EGD);  Surgeon: Cleotis Nipper, MD;  Location: [facility] ENDOSCOPY;  Service: Endoscopy;  Laterality: N/A;  Moderate sedation okay if MAC not available  . EUS N/A 07/10/2013   Procedure: ESOPHAGEAL ENDOSCOPIC ULTRASOUND (EUS) RADIAL;  Surgeon: Arta Silence, MD;  Location: [facility] ENDOSCOPY;  Service: Endoscopy;  Laterality: N/A;  . EYE SURGERY Right    cataract  . FINE NEEDLE ASPIRATION N/A 07/10/2013   Procedure: FINE NEEDLE ASPIRATION (FNA) LINEAR;  Surgeon: Arta Silence, MD;  Location: [facility] ENDOSCOPY;  Service: Endoscopy;  Laterality: N/A;  possible fna  . HARDWARE REMOVAL Right 12/15/2016   Procedure: Hardware removal and tenolysis right wrist with repair reconstruction as necessary;  Surgeon: Roseanne Kaufman, MD;  Location: [facility];  Service: Orthopedics;  Laterality: Right;  60 mins  . JOINT REPLACEMENT     LTKA  . KNEE SURGERY Left    x 5, total knee Left knee  . LAPAROSCOPY N/A 08/07/2013   Procedure: LAPAROSCOPY DIAGNOSTIC;  Surgeon: [name], MD;  Location: [facility];  Service: General;  Laterality: N/A;  . LUMBAR SPINE SURGERY     x2  . LUMBAR SPINE SURGERY     cyst  . ORIF ANKLE FRACTURE Right 08/16/2015   Procedure: OPEN REDUCTION INTERNAL FIXATION (ORIF) ANKLE FRACTURE;  Surgeon: Meredith Pel, MD;  Location: [facility];  Service: Orthopedics;  Laterality: Right;  . ORIF WRIST FRACTURE Right 11/01/2016   Procedure: OPEN REDUCTION INTERNAL FIXATION (ORIF) RIGHT WRIST FRACTURE WITH APPLICATION OF SPANNING PLATE, IRRIGATION AND DEBRIDEMENT RIGHT WRIST;  Surgeon: Roseanne Kaufman, MD;  Location: [facility];  Service: Orthopedics;  Laterality: Right;  . RADIOACTIVE SEED GUIDED EXCISIONAL BREAST BIOPSY Left 12/15/2015   Procedure: LEFT RADIOACTIVE SEED GUIDED EXCISIONAL BREAST  BIOPSY;  Surgeon: [name], MD;  Location: [place];  Service: General;  Laterality: Left;  . WHIPPLE PROCEDURE N/A 08/07/2013   Procedure: WHIPPLE PROCEDURE;  Surgeon: [name], MD;  Location: [facility];  Service: General;  Laterality: N/A;   OB History    No data available      Home Medications    Prior to Admission medications   Medication Sig Start Date End Date Taking? Authorizing Provider  aspirin 81 MG tablet Take 81 mg by mouth daily.   Yes [provider]  atenolol (TENORMIN) 25 MG tablet TAKE 1 TABLET BY MOUTH  EVERY MORNING 06/07/16  Yes Marin Olp, MD  busPIRone (BUSPAR) 15 MG tablet Take 15 mg by mouth at bedtime.   Yes [provider]  Calcium Carbonate (CALCIUM 600 PO) Take 1 tablet by mouth daily.   Yes [provider]  carboxymethylcellulose (REFRESH TEARS) 0.5 % SOLN Place 1  drop into both eyes 2 (two) times daily.    Yes [provider]  docusate sodium (COLACE) 100 MG capsule Take 200 mg by mouth at bedtime.   Yes [provider]  DULoxetine (CYMBALTA) 30 MG capsule Take 1 capsule (30 mg total) by mouth 2 (two) times daily. 04/04/16  Yes Kathrynn Ducking, MD  FLUoxetine (PROZAC) 20 MG capsule Take 20 mg by mouth daily.   Yes [provider]  fluticasone (FLONASE) 50 MCG/ACT nasal spray Place 2 sprays into both nostrils daily. 07/29/16  Yes Marin Olp, MD  furosemide (LASIX) 20 MG tablet Take 1 tablet (20 mg total) by mouth daily. 08/06/15  Yes Isaiah Serge, NP  latanoprost (XALATAN) 0.005 % ophthalmic solution Place 1 drop into both eyes nightly. 08/07/15  Yes [provider]  levothyroxine (SYNTHROID, LEVOTHROID) 200 MCG tablet Take 1 tablet (200 mcg total) by mouth daily before breakfast. 11/22/16 11/22/17 Yes Patrecia Pour, MD  lipase/protease/amylase (CREON) 12000 UNITS CPEP capsule Take 2 capsules (24,000 Units total) by mouth 3 (three) times daily before meals. 05/25/14  Yes Michael Boston, MD    LORazepam (ATIVAN) 0.5 MG tablet Take 1 tablet (0.5 mg total) by mouth every 8 (eight) hours. 11/22/16  Yes Patrecia Pour, MD  lovastatin (MEVACOR) 40 MG tablet TAKE 1 TABLET BY MOUTH AT  BEDTIME 06/07/16  Yes Marin Olp, MD  Multiple Vitamin (MULTIVITAMIN WITH MINERALS) TABS tablet Take 1 tablet by mouth daily.   Yes [provider]  omeprazole (PRILOSEC OTC) 20 MG tablet Take 20 mg by mouth 2 (two) times daily.    Yes [provider]  polycarbophil (FIBERCON) 625 MG tablet Take 1,250 mg by mouth daily.    Yes [provider]  potassium chloride (KLOR-CON 10) 10 MEQ tablet Take 1 tablet (10 mEq total) by mouth daily. 10/27/16  Yes Skeet Latch, MD  pregabalin (LYRICA) 100 MG capsule Take 1 capsule (100 mg total) by mouth 2 (two) times daily. Patient taking differently: Take 100 mg by mouth at bedtime. Take with Lyrica 300 to equal 400 mg 07/18/16  Yes Kathrynn Ducking, MD  pregabalin (LYRICA) 300 MG capsule Take 300 mg by mouth 2 (two) times daily.   Yes [provider]  psyllium (REGULOID) 0.52 g capsule Take 0.52 g by mouth daily.   Yes [provider]  rOPINIRole (REQUIP) 0.5 MG tablet Take 1 tablet (0.5 mg total) by mouth at bedtime. 05/11/15  Yes Dennie Bible, NP  simethicone (MYLICON) 194 MG chewable tablet Chew 125 mg by mouth every 6 (six) hours as needed for flatulence.   Yes [provider]  vitamin B-12 (CYANOCOBALAMIN) 1000 MCG tablet Take 2,000 mcg by mouth daily.   Yes [provider]  Vitamin D, Ergocalciferol, (DRISDOL) 50000 units CAPS capsule Take 1 capsule (50,000 Units total) by mouth every 7 (seven) days. Patient taking differently: Take 50,000 Units by mouth every 7 (seven) days. On Wednesdays 10/31/16  Yes Briscoe Deutscher, DO  vitamin E 400 UNIT capsule Take 800 Units by mouth 2 (two) times daily. For tartive dyskinesia   Yes [provider]  naproxen (NAPROSYN) 500 MG tablet Take 1 tablet  (500 mg total) by mouth 2 (two) times daily with a meal. Patient not taking: Reported on 01/05/2017 10/26/16   Briscoe Deutscher, DO   Family History Family History  Problem Relation Age of Onset  . Cancer Mother        Breast Cancer  with Metastatic disease  . Alzheimer's disease Father   . Other Brother 20       GSW   Social History Social History  Substance Use Topics  . Smoking status: Never Smoker  . Smokeless tobacco: Never Used  . Alcohol use No   Allergies   Other; Metoclopramide hcl; Niacin; Trovan [alatrofloxacin mesylate]; Benzocaine-resorcinol; Celecoxib; Erythromycin base; Glucosamine; Nortriptyline; Phenazopyridine hcl; Sulfa antibiotics; and Sulfonamide derivatives  Review of Systems Review of Systems  Eyes: Negative for visual disturbance.  Gastrointestinal: Negative for nausea and vomiting.  Musculoskeletal: Positive for gait problem and neck pain.  Neurological: Positive for headaches. Negative for dizziness, weakness and numbness.    Physical Exam Updated Vital Signs BP (!) 171/72   Pulse (!) 58   Temp 98.3 F (36.8 C) (Oral)   Resp 18   Ht 5' 1"  (1.549 m)   Wt 68 kg (150 lb)   SpO2 97%   BMI 28.34 kg/m   Physical Exam  Constitutional: She is oriented to person, place, and time. She appears well-developed and well-nourished. No distress.  Patient is afebrile, non-toxic appearing, sitting comfortably in chair in no acute distress.   HENT:  Head: Normocephalic.  Mouth/Throat: Oropharynx is clear and moist. No oropharyngeal exudate.  Ecchymosis to the bridge if the nose with TTP over the nasal bridge.   Eyes: Pupils are equal, round, and reactive to light. Conjunctivae and EOM are normal. Right eye exhibits no discharge. Left eye exhibits no discharge.  No pain with EOM.   Neck: Normal range of motion. Neck supple.  Cardiovascular: Normal rate, regular rhythm, normal heart sounds and intact distal pulses.   Pulmonary/Chest: Effort normal and breath  sounds normal. No respiratory distress. She has no wheezes. She has no rales. She exhibits no tenderness.  Abdominal: Soft. She exhibits no distension and no mass. There is no tenderness. There is no rebound and no guarding.  Musculoskeletal: Normal range of motion. She exhibits tenderness. She exhibits no edema or deformity.  No midline spinal tenderness. TTP of the left trapezius muscle. No other signs of injury or ecchymosis. Ecchymosis and TTP to the anterior tibia.   Neurological: She is alert and oriented to person, place, and time. No cranial nerve deficit or sensory deficit. She exhibits normal muscle tone. Coordination normal.  Neurologic Exam:  - Mental status: Patient is alert and cooperative. Fluent speech and words are clear. Coherent thought processes and insight is good. Patient is oriented x 4 to person, place, time and event.  - Cranial nerves:  CN III, IV, VI: pupils equally round, reactive to light both direct and conscensual and normal accommodation. Full extra-ocular movement. CN V: motor temporalis and masseter strength intact. CN VII : muscles of facial expression intact. CN X :  midline uvula. XI strength of sternocleidomastoid and trapezius muscles 5/5, XII: tongue is midline when protruded. - Motor: No involuntary movements. Muscle tone and bulk normal throughout. Muscle strength is 5/5 in bilateral shoulder abduction, elbow flexion and extension, grip, hip extension, flexion, leg flexion and extension, ankle dorsiflexion and plantar flexion.  - Sensory: Proprioception, light tough sensation intact in all extremities.  - Cerebellar: rapid alternating movements and point to point movement intact in upper and lower extremities. Patient ambulated to ED.  Skin: Skin is warm and dry. She is not diaphoretic. No erythema.  Psychiatric: She has a normal mood and affect.  Nursing note and vitals reviewed.  ED Treatments / Results  DIAGNOSTIC STUDIES: Oxygen Saturation is 98%  on RA,  normal by my interpretation.   COORDINATION OF CARE: 12:02 AM-Discussed next steps with pt. Pt verbalized understanding and is agreeable with the plan.   Labs (all labs ordered are listed, but only abnormal results are displayed) Labs Reviewed - No data to display  EKG  EKG Interpretation None      Radiology Dg Pelvis 1-2 Views  Result Date: 01/05/2017 CLINICAL DATA:  Patient fell with bruising of the left tibia and fibula. Bilateral hip pain. EXAM: PELVIS - 1-2 VIEW COMPARISON:  11/21/2016 CT FINDINGS: Subacute right-sided superior and inferior pubic rami fractures with subtle callus formation is noted. The superior pubic ramus fracture is more apparent than on prior CT and may have declared itself since. Both hip joints are maintained. No proximal femoral fracture. Spinal fusion hardware noted of the lumbosacral spine from approximately L4 through S1. No pelvic diastasis. IMPRESSION: 1. Subacute fractures of the right superior and inferior pubic rami with subtle callus formation. 2. Posterior lumbar spinal fusion hardware noted from L4 through S1. Electronically Signed   By: Ashley Royalty M.D.   On: 01/05/2017 01:06   Dg Tibia/fibula Left  Result Date: 01/05/2017 CLINICAL DATA:  Left leg pain. Patient fell with bruising of the distal left tibia and fibula. EXAM: LEFT TIBIA AND FIBULA - 2 VIEW COMPARISON:  None. 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 No fracture of the tibia nor fibula is identified. There is mild generalized soft tissue induration and swelling of the leg. IMPRESSION: Negative for acute fracture dislocation of the left tibia fibula. Mild soft tissue swelling. Electronically Signed   By: Ashley Royalty M.D.   On: 01/05/2017 01:10   Ct Head Wo Contrast  Result Date: 01/05/2017 CLINICAL DATA:  Status post fall face forward, landing on nose. Concern for head or cervical spine injury. Initial encounter. EXAM: CT HEAD WITHOUT  CONTRAST CT MAXILLOFACIAL WITHOUT CONTRAST CT CERVICAL SPINE WITHOUT CONTRAST TECHNIQUE: Multidetector CT imaging of the head, cervical spine, and maxillofacial structures were performed using the standard protocol without intravenous contrast. Multiplanar CT image reconstructions of the cervical spine and maxillofacial structures were also generated. COMPARISON:  CT of the head performed 11/21/2016, and CT of the cervical spine performed 11/01/2016. MRI of the brain performed 02/26/2015 FINDINGS: CT HEAD FINDINGS Brain: No evidence of acute infarction, hemorrhage, hydrocephalus, extra-axial collection or mass lesion/mass effect. Prominence of the ventricles and sulci reflects moderate cortical volume loss. Cerebellar atrophy is noted. Scattered periventricular and subcortical white matter change likely reflects small vessel ischemic microangiopathy. The brainstem and fourth ventricle are within normal limits. The basal ganglia are unremarkable in appearance. The cerebral hemispheres demonstrate grossly normal gray-white differentiation. No mass effect or midline shift is seen. Vascular: No hyperdense vessel or unexpected calcification. Skull: There is no evidence of fracture; visualized osseous structures are unremarkable in appearance. Other: No significant soft tissue abnormalities are seen. CT MAXILLOFACIAL FINDINGS Osseous: There appears to be a minimally displaced fracture of the left side of the nasal bone. The maxilla and mandible appear intact. Orbits: The orbits are intact bilaterally. Sinuses: The visualized paranasal sinuses and mastoid air cells are well-aerated. Soft tissues: No significant soft tissue abnormalities are seen. The parapharyngeal fat planes are preserved. The nasopharynx, oropharynx and hypopharynx are unremarkable in appearance. The visualized portions of the valleculae and piriform sinuses are grossly unremarkable. The parotid and submandibular glands are within normal limits. No  cervical lymphadenopathy is seen. CT CERVICAL SPINE FINDINGS Alignment:  Normal. Skull base and vertebrae: No acute fracture. No primary bone lesion or focal pathologic process. Soft tissues and spinal canal: No prevertebral fluid or swelling. No visible canal hematoma. Disc levels: Intervertebral disc space narrowing is noted at C5-C6. Degenerative change is noted about the dens. Upper chest: The thyroid gland is diminutive but grossly unremarkable. The visualized lung bases are clear. Other: No additional soft tissue abnormalities are seen. IMPRESSION: 1. No evidence of traumatic intracranial injury. 2. Minimally displaced fracture of the left side of the nasal bone. 3. No evidence of fracture or subluxation along the cervical spine. 4. Moderate cortical volume loss and scattered small vessel ischemic microangiopathy. 5. Minimal degenerative change along the cervical spine. Electronically Signed   By: Garald Balding M.D.   On: 01/05/2017 00:57   Ct Cervical Spine Wo Contrast  Result Date: 01/05/2017 CLINICAL DATA:  Status post fall face forward, landing on nose. Concern for head or cervical spine injury. Initial encounter. EXAM: CT HEAD WITHOUT CONTRAST CT MAXILLOFACIAL WITHOUT CONTRAST CT CERVICAL SPINE WITHOUT CONTRAST TECHNIQUE: Multidetector CT imaging of the head, cervical spine, and maxillofacial structures were performed using the standard protocol without intravenous contrast. Multiplanar CT image reconstructions of the cervical spine and maxillofacial structures were also generated. COMPARISON:  CT of the head performed 11/21/2016, and CT of the cervical spine performed 11/01/2016. MRI of the brain performed 02/26/2015 FINDINGS: CT HEAD FINDINGS Brain: No evidence of acute infarction, hemorrhage, hydrocephalus, extra-axial collection or mass lesion/mass effect. Prominence of the ventricles and sulci reflects moderate cortical volume loss. Cerebellar atrophy is noted. Scattered periventricular and  subcortical white matter change likely reflects small vessel ischemic microangiopathy. The brainstem and fourth ventricle are within normal limits. The basal ganglia are unremarkable in appearance. The cerebral hemispheres demonstrate grossly normal gray-white differentiation. No mass effect or midline shift is seen. Vascular: No hyperdense vessel or unexpected calcification. Skull: There is no evidence of fracture; visualized osseous structures are unremarkable in appearance. Other: No significant soft tissue abnormalities are seen. CT MAXILLOFACIAL FINDINGS Osseous: There appears to be a minimally displaced fracture of the left side of the nasal bone. The maxilla and mandible appear intact. Orbits: The orbits are intact bilaterally. Sinuses: The visualized paranasal sinuses and mastoid air cells are well-aerated. Soft tissues: No significant soft tissue abnormalities are seen. The parapharyngeal fat planes are preserved. The nasopharynx, oropharynx and hypopharynx are unremarkable in appearance. The visualized portions of the valleculae and piriform sinuses are grossly unremarkable. The parotid and submandibular glands are within normal limits. No cervical lymphadenopathy is seen. CT CERVICAL SPINE FINDINGS Alignment: Normal. Skull base and vertebrae: No acute fracture. No primary bone lesion or focal pathologic process. Soft tissues and spinal canal: No prevertebral fluid or swelling. No visible canal hematoma. Disc levels: Intervertebral disc space narrowing is noted at C5-C6. Degenerative change is noted about the dens. Upper chest: The thyroid gland is diminutive but grossly unremarkable. The visualized lung bases are clear. Other: No additional soft tissue abnormalities are seen. IMPRESSION: 1. No evidence of traumatic intracranial injury. 2. Minimally displaced fracture of the left side of the nasal bone. 3. No evidence of fracture or subluxation along the cervical spine. 4. Moderate cortical volume loss and  scattered small vessel ischemic microangiopathy. 5. Minimal degenerative change along the cervical spine. Electronically Signed   By: Garald Balding M.D.   On: 01/05/2017 00:57   Ct Maxillofacial Wo Contrast  Result Date: 01/05/2017 CLINICAL DATA:  Status post fall face forward, landing on nose.  Concern for head or cervical spine injury. Initial encounter. EXAM: CT HEAD WITHOUT CONTRAST CT MAXILLOFACIAL WITHOUT CONTRAST CT CERVICAL SPINE WITHOUT CONTRAST TECHNIQUE: Multidetector CT imaging of the head, cervical spine, and maxillofacial structures were performed using the standard protocol without intravenous contrast. Multiplanar CT image reconstructions of the cervical spine and maxillofacial structures were also generated. COMPARISON:  CT of the head performed 11/21/2016, and CT of the cervical spine performed 11/01/2016. MRI of the brain performed 02/26/2015 FINDINGS: CT HEAD FINDINGS Brain: No evidence of acute infarction, hemorrhage, hydrocephalus, extra-axial collection or mass lesion/mass effect. Prominence of the ventricles and sulci reflects moderate cortical volume loss. Cerebellar atrophy is noted. Scattered periventricular and subcortical white matter change likely reflects small vessel ischemic microangiopathy. The brainstem and fourth ventricle are within normal limits. The basal ganglia are unremarkable in appearance. The cerebral hemispheres demonstrate grossly normal gray-white differentiation. No mass effect or midline shift is seen. Vascular: No hyperdense vessel or unexpected calcification. Skull: There is no evidence of fracture; visualized osseous structures are unremarkable in appearance. Other: No significant soft tissue abnormalities are seen. CT MAXILLOFACIAL FINDINGS Osseous: There appears to be a minimally displaced fracture of the left side of the nasal bone. The maxilla and mandible appear intact. Orbits: The orbits are intact bilaterally. Sinuses: The visualized paranasal sinuses  and mastoid air cells are well-aerated. Soft tissues: No significant soft tissue abnormalities are seen. The parapharyngeal fat planes are preserved. The nasopharynx, oropharynx and hypopharynx are unremarkable in appearance. The visualized portions of the valleculae and piriform sinuses are grossly unremarkable. The parotid and submandibular glands are within normal limits. No cervical lymphadenopathy is seen. CT CERVICAL SPINE FINDINGS Alignment: Normal. Skull base and vertebrae: No acute fracture. No primary bone lesion or focal pathologic process. Soft tissues and spinal canal: No prevertebral fluid or swelling. No visible canal hematoma. Disc levels: Intervertebral disc space narrowing is noted at C5-C6. Degenerative change is noted about the dens. Upper chest: The thyroid gland is diminutive but grossly unremarkable. The visualized lung bases are clear. Other: No additional soft tissue abnormalities are seen. IMPRESSION: 1. No evidence of traumatic intracranial injury. 2. Minimally displaced fracture of the left side of the nasal bone. 3. No evidence of fracture or subluxation along the cervical spine. 4. Moderate cortical volume loss and scattered small vessel ischemic microangiopathy. 5. Minimal degenerative change along the cervical spine. Electronically Signed   By: Garald Balding M.D.   On: 01/05/2017 00:57    Procedures Procedures (including critical care time)  Medications Ordered in ED Medications  oxyCODONE-acetaminophen (PERCOCET/ROXICET) 5-325 MG per tablet 1 tablet (1 tablet Oral Given 01/05/17 0151)     Initial Impression / Assessment and Plan / ED Course  I have reviewed the triage vital signs and the nursing notes.  Pertinent labs & imaging results that were available during my care of the patient were reviewed by me and considered in my medical decision making (see chart for details).    Patient presents after mechanical slip and fall. Ecchymosis and ttp of nasal bridge. CT  with evidence of small left-sided nasal bone fracture.  Imaging otherwise negative for acute injury, reassuring exam, normal neuro. Patient is otherwise well-appearing, afebrile nontoxic and ready go home.  Discharge home with close follow-up with PCP Patient was discussed with Dr. Leonides Schanz who has seen patient and agrees with assessment and plan.  Discussed strict return precautions and advised to return to the emergency department if experiencing any new or worsening symptoms. Instructions were understood  and patient agreed with discharge plan.  Final Clinical Impressions(s) / ED Diagnoses   Final diagnoses:  Fall, initial encounter  Closed fracture of nasal bone, initial encounter    New Prescriptions Discharge Medication List as of 01/05/2017  2:38 AM    I personally performed the services described in this documentation, which was scribed in my presence. The recorded information has been reviewed and is accurate.     Emeline General, [place] 01/05/17 [phone]

## 2017-01-04 NOTE — ED Triage Notes (Signed)
Pt was in bathroom stall at [facility] when the lights went out.  She reached out to feel for her wheelchair and fell face forward, landing on her nose.  Pt laid on the floor yelling, until her son came in and found her.  No loc. Pt not on blood thinners.  Recent fx d/t falls (closed pelvic, R metatarsals). Pt denies any increase in previously injured areas.

## 2017-01-05 ENCOUNTER — Emergency Department (HOSPITAL_COMMUNITY): Payer: Medicare Other

## 2017-01-05 DIAGNOSIS — S199XXA Unspecified injury of neck, initial encounter: Secondary | ICD-10-CM | POA: Diagnosis not present

## 2017-01-05 DIAGNOSIS — S0990XA Unspecified injury of head, initial encounter: Secondary | ICD-10-CM | POA: Diagnosis not present

## 2017-01-05 DIAGNOSIS — S8012XA Contusion of left lower leg, initial encounter: Secondary | ICD-10-CM | POA: Diagnosis not present

## 2017-01-05 DIAGNOSIS — S022XXA Fracture of nasal bones, initial encounter for closed fracture: Secondary | ICD-10-CM | POA: Diagnosis not present

## 2017-01-05 DIAGNOSIS — S32511A Fracture of superior rim of right pubis, initial encounter for closed fracture: Secondary | ICD-10-CM | POA: Diagnosis not present

## 2017-01-05 MED ORDER — OXYCODONE-ACETAMINOPHEN 5-325 MG PO TABS
1.0000 | ORAL_TABLET | Freq: Once | ORAL | Status: AC
  Administered 2017-01-05: 1 via ORAL
  Filled 2017-01-05: qty 1

## 2017-01-05 NOTE — Discharge Instructions (Signed)
As discussed, you have a small fracture of the left side of the nasal bone, imaging was otherwise negative and your exam was reassuring today.  Follow-up with your primary care provider.  Return to the emergency department if you experience a sudden bad headache, dizziness, visual disturbances, nausea, vomiting, or other concerning symptoms in the meantime.

## 2017-01-05 NOTE — ED Notes (Signed)
Delay in lab draw, patient not in room at this time.

## 2017-01-05 NOTE — ED Provider Notes (Signed)
Medical screening examination/treatment/procedure(s) were conducted as a shared visit with non-physician practitioner(s) and myself.  I personally evaluated the patient during the encounter.   EKG Interpretation None       Patient is a 76 year old female who had a mechanical fall out of her wheelchair today. She did hit her head. Is complaining of pelvic pain but recently had right superior and inferior pubic rami fractures that are healing. Imaging shows left nasal fracture but no other acute abnormality. She is neurologically intact. Normal vital signs. Not on blood thinners. Given Percocet for pain control. She has Percocet at home. I recommend close follow-up with her primary care physician given she is having chronic pain from her recent pelvic fractures.   Auri Jahnke, Delice Bison, DO 01/05/17 [phone]

## 2017-01-07 ENCOUNTER — Emergency Department (HOSPITAL_COMMUNITY): Payer: Medicare Other

## 2017-01-07 ENCOUNTER — Encounter (HOSPITAL_COMMUNITY): Payer: Self-pay | Admitting: Nurse Practitioner

## 2017-01-07 ENCOUNTER — Inpatient Hospital Stay (HOSPITAL_COMMUNITY)
Admission: EM | Admit: 2017-01-07 | Discharge: 2017-01-11 | DRG: 552 | Disposition: A | Discharge status: Skilled Nursing Facility | Payer: Medicare Other | Attending: Family Medicine | Admitting: Family Medicine

## 2017-01-07 DIAGNOSIS — Z7982 Long term (current) use of aspirin: Secondary | ICD-10-CM

## 2017-01-07 DIAGNOSIS — F419 Anxiety disorder, unspecified: Secondary | ICD-10-CM | POA: Diagnosis present

## 2017-01-07 DIAGNOSIS — I10 Essential (primary) hypertension: Secondary | ICD-10-CM | POA: Diagnosis present

## 2017-01-07 DIAGNOSIS — I341 Nonrheumatic mitral (valve) prolapse: Secondary | ICD-10-CM | POA: Diagnosis present

## 2017-01-07 DIAGNOSIS — M545 Low back pain: Secondary | ICD-10-CM | POA: Diagnosis not present

## 2017-01-07 DIAGNOSIS — Z9181 History of falling: Secondary | ICD-10-CM

## 2017-01-07 DIAGNOSIS — E876 Hypokalemia: Secondary | ICD-10-CM | POA: Diagnosis present

## 2017-01-07 DIAGNOSIS — F329 Major depressive disorder, single episode, unspecified: Secondary | ICD-10-CM | POA: Diagnosis present

## 2017-01-07 DIAGNOSIS — Z79891 Long term (current) use of opiate analgesic: Secondary | ICD-10-CM

## 2017-01-07 DIAGNOSIS — R296 Repeated falls: Secondary | ICD-10-CM | POA: Diagnosis present

## 2017-01-07 DIAGNOSIS — D696 Thrombocytopenia, unspecified: Secondary | ICD-10-CM | POA: Diagnosis present

## 2017-01-07 DIAGNOSIS — S5291XD Unspecified fracture of right forearm, subsequent encounter for closed fracture with routine healing: Secondary | ICD-10-CM

## 2017-01-07 DIAGNOSIS — H409 Unspecified glaucoma: Secondary | ICD-10-CM | POA: Diagnosis present

## 2017-01-07 DIAGNOSIS — I69364 Other paralytic syndrome following cerebral infarction affecting left non-dominant side: Secondary | ICD-10-CM

## 2017-01-07 DIAGNOSIS — Z8507 Personal history of malignant neoplasm of pancreas: Secondary | ICD-10-CM

## 2017-01-07 DIAGNOSIS — S3210XA Unspecified fracture of sacrum, initial encounter for closed fracture: Secondary | ICD-10-CM

## 2017-01-07 DIAGNOSIS — M810 Age-related osteoporosis without current pathological fracture: Secondary | ICD-10-CM | POA: Diagnosis present

## 2017-01-07 DIAGNOSIS — G2401 Drug induced subacute dyskinesia: Secondary | ICD-10-CM | POA: Diagnosis present

## 2017-01-07 DIAGNOSIS — Z96652 Presence of left artificial knee joint: Secondary | ICD-10-CM | POA: Diagnosis present

## 2017-01-07 DIAGNOSIS — E785 Hyperlipidemia, unspecified: Secondary | ICD-10-CM | POA: Diagnosis present

## 2017-01-07 DIAGNOSIS — Z981 Arthrodesis status: Secondary | ICD-10-CM

## 2017-01-07 DIAGNOSIS — Z881 Allergy status to other antibiotic agents status: Secondary | ICD-10-CM

## 2017-01-07 DIAGNOSIS — S32591A Other specified fracture of right pubis, initial encounter for closed fracture: Secondary | ICD-10-CM | POA: Diagnosis present

## 2017-01-07 DIAGNOSIS — E039 Hypothyroidism, unspecified: Secondary | ICD-10-CM | POA: Diagnosis present

## 2017-01-07 DIAGNOSIS — S022XXA Fracture of nasal bones, initial encounter for closed fracture: Secondary | ICD-10-CM | POA: Diagnosis present

## 2017-01-07 DIAGNOSIS — S32119A Unspecified Zone I fracture of sacrum, initial encounter for closed fracture: Principal | ICD-10-CM | POA: Diagnosis present

## 2017-01-07 DIAGNOSIS — K219 Gastro-esophageal reflux disease without esophagitis: Secondary | ICD-10-CM | POA: Diagnosis present

## 2017-01-07 DIAGNOSIS — M5416 Radiculopathy, lumbar region: Secondary | ICD-10-CM | POA: Diagnosis not present

## 2017-01-07 DIAGNOSIS — Z882 Allergy status to sulfonamides status: Secondary | ICD-10-CM

## 2017-01-07 DIAGNOSIS — Z888 Allergy status to other drugs, medicaments and biological substances status: Secondary | ICD-10-CM

## 2017-01-07 DIAGNOSIS — Z79899 Other long term (current) drug therapy: Secondary | ICD-10-CM

## 2017-01-07 HISTORY — DX: Unspecified fracture of sacrum, initial encounter for closed fracture: S32.10XA

## 2017-01-07 LAB — COMPREHENSIVE METABOLIC PANEL
ALT: 19 U/L (ref 14–54)
ANION GAP: 10 (ref 5–15)
AST: 26 U/L (ref 15–41)
Albumin: 4 g/dL (ref 3.5–5.0)
Alkaline Phosphatase: 151 U/L — ABNORMAL HIGH (ref 38–126)
BUN: 19 mg/dL (ref 6–20)
CALCIUM: 9 mg/dL (ref 8.9–10.3)
CO2: 25 mmol/L (ref 22–32)
Chloride: 107 mmol/L (ref 101–111)
Creatinine, Ser: 0.64 mg/dL (ref 0.44–1.00)
GFR calc non Af Amer: >60 mL/min (ref >60)
Glucose, Bld: 82 mg/dL (ref 65–99)
Potassium: 3.5 mmol/L (ref 3.5–5.1)
SODIUM: 142 mmol/L (ref 135–145)
Total Bilirubin: 1.1 mg/dL (ref 0.3–1.2)
Total Protein: 7 g/dL (ref 6.5–8.1)

## 2017-01-07 LAB — CBC
HCT: 37.2 % (ref 36.0–46.0)
HEMOGLOBIN: 12.4 g/dL (ref 12.0–15.0)
MCH: 31.2 pg (ref 26.0–34.0)
MCHC: 33.3 g/dL (ref 30.0–36.0)
MCV: 93.5 fL (ref 78.0–100.0)
Platelets: 126 10*3/uL — ABNORMAL LOW (ref 150–400)
RBC: 3.98 MIL/uL (ref 3.87–5.11)
RDW: 14.6 % (ref 11.5–15.5)
WBC: 7.3 10*3/uL (ref 4.0–10.5)

## 2017-01-07 MED ORDER — PREDNISONE 10 MG PO TABS: 10.0000 mg | ORAL_TABLET | Freq: Every day | ORAL | 0 refills | Status: DC

## 2017-01-07 MED ORDER — SODIUM CHLORIDE 0.9 % IV BOLUS (SEPSIS): 500.0000 mL | Freq: Once | INTRAVENOUS | Status: DC

## 2017-01-07 MED ORDER — OXYCODONE-ACETAMINOPHEN 5-325 MG PO TABS: 1.0000 | ORAL_TABLET | ORAL | 0 refills | Status: DC | PRN

## 2017-01-07 MED ORDER — MORPHINE SULFATE (PF) 2 MG/ML IV SOLN
2.0000 mg | Freq: Once | INTRAVENOUS | Status: AC
  Administered 2017-01-08: 2 mg via INTRAVENOUS
  Filled 2017-01-07: qty 1

## 2017-01-07 MED ORDER — CEPHALEXIN 500 MG PO CAPS: 500.0000 mg | ORAL_CAPSULE | Freq: Four times a day (QID) | ORAL | 0 refills | Status: DC

## 2017-01-07 MED ORDER — HYDROCODONE-ACETAMINOPHEN 5-325 MG PO TABS
1.0000 | ORAL_TABLET | Freq: Once | ORAL | Status: AC
  Administered 2017-01-07: 1 via ORAL
  Filled 2017-01-07: qty 1

## 2017-01-07 NOTE — Discharge Instructions (Signed)
I suspect your pain is related to nerve irritation which starts in your lower back. Additionally, you have a new small fracture of the sacrum in the area that I pointed to while in your room. Follow-up with Dr. Lorin Mercy or one of his colleagues. Prescription for pain medicine and prednisone.

## 2017-01-07 NOTE — ED Notes (Signed)
Tried using female urinal and running water to get patient to void.  Patient unable to void at this time.

## 2017-01-07 NOTE — ED Notes (Signed)
Pt attempted to urinate but does not need to at this moment. RN aware.

## 2017-01-07 NOTE — ED Triage Notes (Signed)
Pt is presented by family members who collaborate a report that she has had multiple fall in recent past. However last night she started experiencing severe pelvic pain that radiates to lower extremities but worse to the left leg, so much so that she is unable to bare weight on that leg. She is also reporting loss of sensation.

## 2017-01-07 NOTE — ED Provider Notes (Addendum)
[facility] DEPT Provider Note   CSN: [id number] Arrival date & time: 01/07/17  1658     History   Chief Complaint Chief Complaint  Patient presents with  . Hip Pain  . Leg Pain    HPI [name] is a 76 y.o. female.  Patient presents with lower back pain radiating to left leg for 3-4 days.pelvic x-rays obtained on 01/05/17 revealed subacute fractures of the right superior and inferior pubic rami and a posterior lumbar fusion at L4-S1.  Patient has had pain with weightbearing. No fever, sweats, chills.  Patient has been having trouble bearing weight lately. She had a lower back surgery by Dr. Rodell Perna several years ago.      Past Medical History:  Diagnosis Date  . Allergic rhinitis   . Cancer ([place]) 07/10/13   Pancreatic cancer with MRI scan 06-19-13  . Chronic maxillary sinusitis    neti pot  . Depression    alone a lot  . Eustachian tube dysfunction   . GERD (gastroesophageal reflux disease)   . Glaucoma   . Heart murmur    hx. "MVP" -predental antibiotics  . Hiatal hernia   . Hypertension   . Hypothyroid   . Memory loss    short term memory loss  . Mitral valve prolapse    antibiotics before dental procedures  . Neuropathy   . Osteoarthritis   . Stroke [facility])    mini storkes left leg paraylsis. patient denies weakness 01/08/14.   . Tardive dyskinesia    possibly reglan, vitamin E helps  . Urgency of urination    some UTI in past    Patient Active Problem List   Diagnosis Date Noted  . Fall at home, initial encounter 01/07/2017  . Sacral fracture, closed ([place]) 01/07/2017  . Back pain 01/07/2017  . Thrombocytopenia ([place]) 01/07/2017  . Aftercare for healing traumatic fracture of radius, right, closed 12/15/2016  . Closed fracture of ramus of right pubis ([place]) 11/22/2016  . Fall 11/21/2016  . Open Colles' fracture of right radius 11/02/2016  . Pigmented villonodular synovitis of knee, left 03/22/2016  . Abnormal radionuclide bone scan 08/16/2015    . Ankle fracture, bimalleolar, closed 08/16/2015  . HLD (hyperlipidemia) 07/28/2015  . Fatty liver 09/09/2014  . Lumbago 07/07/2014  . Abnormality of gait 03/27/2014  . Memory loss 01/08/2014  . 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 Carcinoma of head of pancreas ([place]) 07/01/2013  . 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 Paresthesia of foot 06/06/2012  . 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 Colon polyps 06/05/2008  . Osteoarthritis 06/04/2008  . Chronic diarrhea 04/22/2008  . Hypothyroidism 02/26/2007  . Adjustment disorder with mixed anxiety and depressed mood 02/26/2007  . Unspecified glaucoma 02/26/2007  . Essential hypertension 02/26/2007  . Mitral valve disease 02/26/2007  . Allergic rhinitis 02/26/2007  . GERD 02/26/2007  . Hiatal hernia 02/26/2007  . Osteoporosis 02/26/2007    Past Surgical History:  Procedure Laterality Date  . 1 baker cyst removed    . ABDOMINAL HYSTERECTOMY     including ovaries  . BACK SURGERY     fusion  . BLEPHAROPLASTY Bilateral    with cataract surgery  . BREAST EXCISIONAL BIOPSY     left x2  . BREAST SURGERY     Biopsy left 2 times  . COLONOSCOPY W/ POLYPECTOMY     2004 last colonoscopy, no polyps  . DILATION AND CURETTAGE OF UTERUS     x3  . ESOPHAGOGASTRODUODENOSCOPY N/A  09/11/2013   Procedure: ESOPHAGOGASTRODUODENOSCOPY (EGD);  Surgeon: Cleotis Nipper, MD;  Location: [facility] ENDOSCOPY;  Service: Endoscopy;  Laterality: N/A;  Moderate sedation okay if MAC not available  . EUS N/A 07/10/2013   Procedure: ESOPHAGEAL ENDOSCOPIC ULTRASOUND (EUS) RADIAL;  Surgeon: Arta Silence, MD;  Location: [facility] ENDOSCOPY;  Service: Endoscopy;  Laterality: N/A;  . EYE SURGERY Right    cataract  . FINE NEEDLE ASPIRATION N/A 07/10/2013   Procedure: FINE NEEDLE ASPIRATION (FNA) LINEAR;  Surgeon: Arta Silence, MD;  Location: [facility] ENDOSCOPY;  Service: Endoscopy;  Laterality: N/A;  possible fna  . HARDWARE REMOVAL Right 12/15/2016    Procedure: Hardware removal and tenolysis right wrist with repair reconstruction as necessary;  Surgeon: Roseanne Kaufman, MD;  Location: [place];  Service: Orthopedics;  Laterality: Right;  60 mins  . JOINT REPLACEMENT     LTKA  . KNEE SURGERY Left    x 5, total knee Left knee  . LAPAROSCOPY N/A 08/07/2013   Procedure: LAPAROSCOPY DIAGNOSTIC;  Surgeon: [name], MD;  Location: [name];  Service: General;  Laterality: N/A;  . LUMBAR SPINE SURGERY     x2  . LUMBAR SPINE SURGERY     cyst  . ORIF ANKLE FRACTURE Right 08/16/2015   Procedure: OPEN REDUCTION INTERNAL FIXATION (ORIF) ANKLE FRACTURE;  Surgeon: Meredith Pel, MD;  Location: [place];  Service: Orthopedics;  Laterality: Right;  . ORIF WRIST FRACTURE Right 11/01/2016   Procedure: OPEN REDUCTION INTERNAL FIXATION (ORIF) RIGHT WRIST FRACTURE WITH APPLICATION OF SPANNING PLATE, IRRIGATION AND DEBRIDEMENT RIGHT WRIST;  Surgeon: Roseanne Kaufman, MD;  Location: [facility];  Service: Orthopedics;  Laterality: Right;  . RADIOACTIVE SEED GUIDED EXCISIONAL BREAST BIOPSY Left 12/15/2015   Procedure: LEFT RADIOACTIVE SEED GUIDED EXCISIONAL BREAST BIOPSY;  Surgeon: [name], MD;  Location: [place];  Service: General;  Laterality: Left;  . WHIPPLE PROCEDURE N/A 08/07/2013   Procedure: WHIPPLE PROCEDURE;  Surgeon: [name], MD;  Location: [place];  Service: General;  Laterality: N/A;    OB History    No data available       Home Medications    Prior to Admission medications   Medication Sig Start Date End Date Taking? Authorizing Provider  aspirin 81 MG tablet Take 81 mg by mouth daily.   Yes [provider]  atenolol (TENORMIN) 25 MG tablet TAKE 1 TABLET BY MOUTH  EVERY MORNING 06/07/16  Yes Marin Olp, MD  busPIRone (BUSPAR) 15 MG tablet Take 15 mg by mouth at bedtime.   Yes [provider]  Calcium Carbonate (CALCIUM 600 PO) Take 1 tablet by mouth daily.   Yes [provider]  carboxymethylcellulose (REFRESH  TEARS) 0.5 % SOLN Place 1 drop into both eyes 2 (two) times daily.    Yes [provider]  docusate sodium (COLACE) 100 MG capsule Take 200 mg by mouth at bedtime.   Yes [provider]  DULoxetine (CYMBALTA) 30 MG capsule Take 1 capsule (30 mg total) by mouth 2 (two) times daily. 04/04/16  Yes Kathrynn Ducking, MD  FLUoxetine (PROZAC) 20 MG capsule Take 20 mg by mouth daily.   Yes [provider]  fluticasone (FLONASE) 50 MCG/ACT nasal spray Place 2 sprays into both nostrils daily. 07/29/16  Yes Marin Olp, MD  furosemide (LASIX) 20 MG tablet Take 1 tablet (20 mg total) by mouth daily. 08/06/15  Yes Isaiah Serge, NP  latanoprost (XALATAN) 0.005 % ophthalmic solution Place 1 drop into both eyes  nightly. 08/07/15  Yes [provider]  levothyroxine (SYNTHROID, LEVOTHROID) 200 MCG tablet Take 1 tablet (200 mcg total) by mouth daily before breakfast. 11/22/16 11/22/17 Yes Patrecia Pour, MD  lipase/protease/amylase (CREON) 12000 UNITS CPEP capsule Take 2 capsules (24,000 Units total) by mouth 3 (three) times daily before meals. 05/25/14  Yes Michael Boston, MD  LORazepam (ATIVAN) 0.5 MG tablet Take 1 tablet (0.5 mg total) by mouth every 8 (eight) hours. 11/22/16  Yes Patrecia Pour, MD  lovastatin (MEVACOR) 40 MG tablet TAKE 1 TABLET BY MOUTH AT  BEDTIME 06/07/16  Yes Marin Olp, MD  Multiple Vitamin (MULTIVITAMIN WITH MINERALS) TABS tablet Take 1 tablet by mouth daily.   Yes [provider]  naproxen (NAPROSYN) 500 MG tablet Take 1 tablet (500 mg total) by mouth 2 (two) times daily with a meal. 10/26/16  Yes Briscoe Deutscher, DO  omeprazole (PRILOSEC OTC) 20 MG tablet Take 20 mg by mouth 2 (two) times daily.    Yes [provider]  oxyCODONE (OXY IR/ROXICODONE) 5 MG immediate release tablet Take 5 mg by mouth every 4 (four) hours as needed for severe pain.   Yes [provider]  polycarbophil (FIBERCON) 625 MG tablet Take 1,250 mg by mouth  daily.    Yes [provider]  potassium chloride (KLOR-CON 10) 10 MEQ tablet Take 1 tablet (10 mEq total) by mouth daily. 10/27/16  Yes Skeet Latch, MD  pregabalin (LYRICA) 100 MG capsule Take 1 capsule (100 mg total) by mouth 2 (two) times daily. Patient taking differently: Take 100 mg by mouth at bedtime. Take with Lyrica 300 to equal 400 mg 07/18/16  Yes Kathrynn Ducking, MD  pregabalin (LYRICA) 300 MG capsule Take 300 mg by mouth 2 (two) times daily.   Yes [provider]  Probiotic Product (PROBIOTIC-10 PO) Take 1 tablet by mouth 2 (two) times daily.   Yes [provider]  psyllium (REGULOID) 0.52 g capsule Take 0.52 g by mouth daily.   Yes [provider]  rOPINIRole (REQUIP) 0.5 MG tablet Take 1 tablet (0.5 mg total) by mouth at bedtime. 05/11/15  Yes Dennie Bible, NP  simethicone (MYLICON) 765 MG chewable tablet Chew 125 mg by mouth every 6 (six) hours as needed for flatulence.   Yes [provider]  vitamin B-12 (CYANOCOBALAMIN) 1000 MCG tablet Take 2,000 mcg by mouth daily.   Yes [provider]  Vitamin D, Ergocalciferol, (DRISDOL) 50000 units CAPS capsule Take 1 capsule (50,000 Units total) by mouth every 7 (seven) days. Patient taking differently: Take 50,000 Units by mouth every 7 (seven) days. On Wednesdays 10/31/16  Yes Briscoe Deutscher, DO  vitamin E 400 UNIT capsule Take 800 Units by mouth 2 (two) times daily. For tartive dyskinesia   Yes [provider]  ciprofloxacin (CIPRO) 500 MG tablet Take 500 mg by mouth 2 (two) times daily.    [provider]  oxyCODONE-acetaminophen (PERCOCET/ROXICET) 5-325 MG tablet Take 1 tablet by mouth every 4 (four) hours as needed for severe pain. 01/07/17   Nat Christen, MD  predniSONE (DELTASONE) 10 MG tablet Take 1 tablet (10 mg total) by mouth daily with breakfast. 1 tablet twice a day for 4 days; one tablet once a day for 4 days 01/07/17   Nat Christen, MD    Family  History Family History  Problem Relation Age of Onset  . Cancer Mother        Breast Cancer with Metastatic disease  .  Alzheimer's disease Father   . Other Brother 6       GSW    Social History Social History  Substance Use Topics  . Smoking status: Never Smoker  . Smokeless tobacco: Never Used  . Alcohol use No     Allergies   Other; Metoclopramide hcl; Niacin; Trovan [alatrofloxacin mesylate]; Benzocaine-resorcinol; Celecoxib; Erythromycin base; Glucosamine; Nortriptyline; Phenazopyridine hcl; Sulfa antibiotics; and Sulfonamide derivatives   Review of Systems Review of Systems  All other systems reviewed and are negative.    Physical Exam Updated Vital Signs BP (!) 122/109   Pulse 96   Temp 98 F (36.7 C)   Resp 16   SpO2 97%   Physical Exam  Constitutional: She is oriented to person, place, and time. She appears well-developed and well-nourished.  HENT:  Head: Normocephalic and atraumatic.  Eyes: Conjunctivae are normal.  Neck: Neck supple.  Cardiovascular: Normal rate and regular rhythm.   Pulmonary/Chest: Effort normal and breath sounds normal.  Abdominal: Soft. Bowel sounds are normal.  Musculoskeletal:  Tenderness in the lumbar spine left greater than right.  Pain with straight leg raise left greater than right  Neurological: She is alert and oriented to person, place, and time.  Skin: Skin is warm and dry.  Psychiatric: She has a normal mood and affect. Her behavior is normal.  Nursing note and vitals reviewed.    ED Treatments / Results  Labs (all labs ordered are listed, but only abnormal results are displayed) Labs Reviewed  COMPREHENSIVE METABOLIC PANEL - Abnormal; Notable for the following:       Result Value   Alkaline Phosphatase 151 (*)    All other components within normal limits  CBC - Abnormal; Notable for the following:    Platelets 126 (*)    All other components within normal limits  URINE CULTURE  URINALYSIS, ROUTINE W REFLEX  MICROSCOPIC    EKG  EKG Interpretation None       Radiology Ct Lumbar Spine Wo Contrast  Result Date: 01/07/2017 CLINICAL DATA:  76 y/o F; multiple falls with severe pelvic pain radiating to the lower extremities. EXAM: CT LUMBAR SPINE WITHOUT CONTRAST TECHNIQUE: Multidetector CT imaging of the lumbar spine was performed without intravenous contrast administration. Multiplanar CT image reconstructions were also generated. COMPARISON:  11/21/2016 lumbar spine radiographs. 03/09/2015 CT abdomen and pelvis. FINDINGS: Segmentation: 5 lumbar type vertebrae. Alignment: Stable grade 1 L4-5 anterolisthesis. Vertebrae: Acute minimally displaced fracture of the left sacral ala (series 3, image 95). Bones are diffusely demineralized. Posterior instrumented fusion at L4 through S1 and laminectomy with intact hardware. No appreciable periprosthetic lucency or fracture. Stable central superior endplate chronic fracture of L3 vertebral body without significant loss of height. Stable mild loss of height of the posterior aspect of L5 vertebral body. No new acute lumbar spine fracture or loss of vertebral body height identified. Paraspinal and other soft tissues: Mild calcific atherosclerosis of the abdominal aorta. Disc levels: Streak artifact from posterior instrumentation and interbody fusion hardware partially obscures the spinal canal and neural foramen from L4 through S1. Lumbar degenerative changes are greatest at L3-4 there appears to be a disc bulge with facet and ligamentum flavum hypertrophy resulting in at least moderate canal stenosis, mild left, and moderate to severe right foraminal narrowing. IMPRESSION: 1. Acute minimally displaced fracture of the left sacral ala. 2. Stable grade 1 L4-5 anterolisthesis, L3 central superior endplate deformity without loss of vertebral body height, and mild posterior loss of height of L5 vertebral body.  3. L4-S1 posterior instrumented fusion without apparent hardware  related complication. 4. Lumbar degenerative changes greatest at the L3-4 level where there is multifactorial at least moderate canal stenosis with mild left/ moderate to severe right foraminal stenosis. Electronically Signed   By: Kristine Garbe M.D.   On: 01/07/2017 22:33    Procedures Procedures (including critical care time)  Medications Ordered in ED Medications  sodium chloride 0.9 % bolus 500 mL (not administered)  morphine 2 MG/ML injection 2 mg (not administered)  HYDROcodone-acetaminophen (NORCO/VICODIN) 5-325 MG per tablet 1 tablet (1 tablet Oral Given 01/07/17 2046)     Initial Impression / Assessment and Plan / ED Course  I have reviewed the triage vital signs and the nursing notes.  Pertinent labs & imaging results that were available during my care of the patient were reviewed by me and considered in my medical decision making (see chart for details).     CT lumbar spine as noted above. I discussed findings with patient and her family in great detail. They will follow-up with Dr. Lorin Mercy next week. Prescription for Percocet and prednisone given.   2349:  We'll obtain urinalysis.  Final Clinical Impressions(s) / ED Diagnoses   Final diagnoses:  Lumbar back pain with radiculopathy affecting left lower extremity    New Prescriptions New Prescriptions   OXYCODONE-ACETAMINOPHEN (PERCOCET/ROXICET) 5-325 MG TABLET    Take 1 tablet by mouth every 4 (four) hours as needed for severe pain.   PREDNISONE (DELTASONE) 10 MG TABLET    Take 1 tablet (10 mg total) by mouth daily with breakfast. 1 tablet twice a day for 4 days; one tablet once a day for 4 days     Nat Christen, MD 01/07/17 2315    Nat Christen, MD 01/07/17 [phone]

## 2017-01-08 ENCOUNTER — Encounter (HOSPITAL_COMMUNITY): Payer: Self-pay

## 2017-01-08 DIAGNOSIS — Z79899 Other long term (current) drug therapy: Secondary | ICD-10-CM | POA: Diagnosis not present

## 2017-01-08 DIAGNOSIS — I69364 Other paralytic syndrome following cerebral infarction affecting left non-dominant side: Secondary | ICD-10-CM | POA: Diagnosis not present

## 2017-01-08 DIAGNOSIS — Z79891 Long term (current) use of opiate analgesic: Secondary | ICD-10-CM | POA: Diagnosis not present

## 2017-01-08 DIAGNOSIS — Z8507 Personal history of malignant neoplasm of pancreas: Secondary | ICD-10-CM | POA: Diagnosis not present

## 2017-01-08 DIAGNOSIS — Z4789 Encounter for other orthopedic aftercare: Secondary | ICD-10-CM | POA: Diagnosis not present

## 2017-01-08 DIAGNOSIS — S3993XA Unspecified injury of pelvis, initial encounter: Secondary | ICD-10-CM | POA: Diagnosis not present

## 2017-01-08 DIAGNOSIS — R296 Repeated falls: Secondary | ICD-10-CM | POA: Diagnosis present

## 2017-01-08 DIAGNOSIS — E876 Hypokalemia: Secondary | ICD-10-CM | POA: Diagnosis present

## 2017-01-08 DIAGNOSIS — Z96652 Presence of left artificial knee joint: Secondary | ICD-10-CM | POA: Diagnosis present

## 2017-01-08 DIAGNOSIS — S5290XA Unspecified fracture of unspecified forearm, initial encounter for closed fracture: Secondary | ICD-10-CM | POA: Diagnosis not present

## 2017-01-08 DIAGNOSIS — R262 Difficulty in walking, not elsewhere classified: Secondary | ICD-10-CM | POA: Diagnosis not present

## 2017-01-08 DIAGNOSIS — D696 Thrombocytopenia, unspecified: Secondary | ICD-10-CM | POA: Diagnosis not present

## 2017-01-08 DIAGNOSIS — W19XXXA Unspecified fall, initial encounter: Secondary | ICD-10-CM | POA: Diagnosis not present

## 2017-01-08 DIAGNOSIS — S32591D Other specified fracture of right pubis, subsequent encounter for fracture with routine healing: Secondary | ICD-10-CM | POA: Diagnosis not present

## 2017-01-08 DIAGNOSIS — Z9181 History of falling: Secondary | ICD-10-CM | POA: Diagnosis not present

## 2017-01-08 DIAGNOSIS — S32119A Unspecified Zone I fracture of sacrum, initial encounter for closed fracture: Secondary | ICD-10-CM | POA: Diagnosis not present

## 2017-01-08 DIAGNOSIS — G2401 Drug induced subacute dyskinesia: Secondary | ICD-10-CM | POA: Diagnosis present

## 2017-01-08 DIAGNOSIS — S5291XD Unspecified fracture of right forearm, subsequent encounter for closed fracture with routine healing: Secondary | ICD-10-CM

## 2017-01-08 DIAGNOSIS — F419 Anxiety disorder, unspecified: Secondary | ICD-10-CM | POA: Diagnosis present

## 2017-01-08 DIAGNOSIS — M545 Low back pain: Secondary | ICD-10-CM | POA: Diagnosis not present

## 2017-01-08 DIAGNOSIS — E039 Hypothyroidism, unspecified: Secondary | ICD-10-CM | POA: Diagnosis not present

## 2017-01-08 DIAGNOSIS — Z981 Arthrodesis status: Secondary | ICD-10-CM | POA: Diagnosis not present

## 2017-01-08 DIAGNOSIS — M810 Age-related osteoporosis without current pathological fracture: Secondary | ICD-10-CM | POA: Diagnosis present

## 2017-01-08 DIAGNOSIS — H409 Unspecified glaucoma: Secondary | ICD-10-CM | POA: Diagnosis present

## 2017-01-08 DIAGNOSIS — K219 Gastro-esophageal reflux disease without esophagitis: Secondary | ICD-10-CM | POA: Diagnosis present

## 2017-01-08 DIAGNOSIS — M5416 Radiculopathy, lumbar region: Secondary | ICD-10-CM | POA: Diagnosis present

## 2017-01-08 DIAGNOSIS — M5440 Lumbago with sciatica, unspecified side: Secondary | ICD-10-CM | POA: Diagnosis not present

## 2017-01-08 DIAGNOSIS — Z7982 Long term (current) use of aspirin: Secondary | ICD-10-CM | POA: Diagnosis not present

## 2017-01-08 DIAGNOSIS — S32591A Other specified fracture of right pubis, initial encounter for closed fracture: Secondary | ICD-10-CM | POA: Diagnosis present

## 2017-01-08 DIAGNOSIS — M6281 Muscle weakness (generalized): Secondary | ICD-10-CM | POA: Diagnosis not present

## 2017-01-08 DIAGNOSIS — I341 Nonrheumatic mitral (valve) prolapse: Secondary | ICD-10-CM | POA: Diagnosis present

## 2017-01-08 DIAGNOSIS — S3210XA Unspecified fracture of sacrum, initial encounter for closed fracture: Secondary | ICD-10-CM | POA: Diagnosis not present

## 2017-01-08 DIAGNOSIS — R488 Other symbolic dysfunctions: Secondary | ICD-10-CM | POA: Diagnosis not present

## 2017-01-08 DIAGNOSIS — S3219XS Other fracture of sacrum, sequela: Secondary | ICD-10-CM | POA: Diagnosis not present

## 2017-01-08 DIAGNOSIS — F329 Major depressive disorder, single episode, unspecified: Secondary | ICD-10-CM | POA: Diagnosis present

## 2017-01-08 DIAGNOSIS — I10 Essential (primary) hypertension: Secondary | ICD-10-CM | POA: Diagnosis not present

## 2017-01-08 DIAGNOSIS — E785 Hyperlipidemia, unspecified: Secondary | ICD-10-CM | POA: Diagnosis not present

## 2017-01-08 LAB — URINALYSIS, ROUTINE W REFLEX MICROSCOPIC
BILIRUBIN URINE: NEGATIVE
Glucose, UA: NEGATIVE mg/dL
Hgb urine dipstick: NEGATIVE
KETONES UR: 20 mg/dL — AB
LEUKOCYTES UA: NEGATIVE
NITRITE: NEGATIVE
PROTEIN: NEGATIVE mg/dL
Specific Gravity, Urine: 1.015 (ref 1.005–1.030)
pH: 5 (ref 5.0–8.0)

## 2017-01-08 LAB — TSH: TSH: 5.473 u[IU]/mL — AB (ref 0.350–4.500)

## 2017-01-08 MED ORDER — LORAZEPAM 0.5 MG PO TABS
0.5000 mg | ORAL_TABLET | Freq: Three times a day (TID) | ORAL | Status: DC | PRN
  Administered 2017-01-08: 0.5 mg via ORAL
  Filled 2017-01-08 (×2): qty 1

## 2017-01-08 MED ORDER — FLUTICASONE PROPIONATE 50 MCG/ACT NA SUSP
2.0000 | Freq: Every day | NASAL | Status: DC
  Administered 2017-01-08 – 2017-01-11 (×4): 2 via NASAL
  Filled 2017-01-08: qty 16

## 2017-01-08 MED ORDER — DOCUSATE SODIUM 100 MG PO CAPS
200.0000 mg | ORAL_CAPSULE | Freq: Every day | ORAL | Status: DC
  Administered 2017-01-08 – 2017-01-10 (×3): 200 mg via ORAL
  Filled 2017-01-08 (×3): qty 2

## 2017-01-08 MED ORDER — CALCIUM POLYCARBOPHIL 625 MG PO TABS
1250.0000 mg | ORAL_TABLET | Freq: Every day | ORAL | Status: DC
  Administered 2017-01-08 – 2017-01-11 (×4): 1250 mg via ORAL
  Filled 2017-01-08 (×4): qty 2

## 2017-01-08 MED ORDER — PSYLLIUM 95 % PO PACK
1.0000 | PACK | Freq: Every day | ORAL | Status: DC
  Administered 2017-01-08 – 2017-01-11 (×4): 1 via ORAL
  Filled 2017-01-08 (×5): qty 1

## 2017-01-08 MED ORDER — BUSPIRONE HCL 5 MG PO TABS
15.0000 mg | ORAL_TABLET | Freq: Every day | ORAL | Status: DC
  Administered 2017-01-08 – 2017-01-10 (×3): 15 mg via ORAL
  Filled 2017-01-08 (×3): qty 3

## 2017-01-08 MED ORDER — MORPHINE SULFATE (PF) 2 MG/ML IV SOLN: 4.0000 mg | Freq: Once | INTRAVENOUS | Status: DC

## 2017-01-08 MED ORDER — METHOCARBAMOL 500 MG PO TABS: 500.0000 mg | ORAL_TABLET | Freq: Three times a day (TID) | ORAL | Status: DC | PRN

## 2017-01-08 MED ORDER — ONDANSETRON HCL 4 MG/2ML IJ SOLN
4.0000 mg | Freq: Three times a day (TID) | INTRAMUSCULAR | Status: DC | PRN
  Administered 2017-01-08 – 2017-01-11 (×3): 4 mg via INTRAVENOUS
  Filled 2017-01-08 (×3): qty 2

## 2017-01-08 MED ORDER — LATANOPROST 0.005 % OP SOLN
1.0000 [drp] | Freq: Every day | OPHTHALMIC | Status: DC
  Administered 2017-01-08 – 2017-01-10 (×3): 1 [drp] via OPHTHALMIC
  Filled 2017-01-08: qty 2.5

## 2017-01-08 MED ORDER — LEVOTHYROXINE SODIUM 200 MCG PO TABS
200.0000 ug | ORAL_TABLET | Freq: Every day | ORAL | Status: DC
  Administered 2017-01-08 – 2017-01-11 (×4): 200 ug via ORAL
  Filled 2017-01-08: qty 1
  Filled 2017-01-08 (×4): qty 2
  Filled 2017-01-08 (×3): qty 1

## 2017-01-08 MED ORDER — VITAMIN E 180 MG (400 UNIT) PO CAPS
800.0000 [IU] | ORAL_CAPSULE | Freq: Two times a day (BID) | ORAL | Status: DC
  Administered 2017-01-08 – 2017-01-11 (×7): 800 [IU] via ORAL
  Filled 2017-01-08 (×7): qty 2

## 2017-01-08 MED ORDER — POLYVINYL ALCOHOL 1.4 % OP SOLN
1.0000 [drp] | Freq: Two times a day (BID) | OPHTHALMIC | Status: DC
  Administered 2017-01-08 – 2017-01-11 (×7): 1 [drp] via OPHTHALMIC
  Filled 2017-01-08: qty 15

## 2017-01-08 MED ORDER — CALCIUM CARBONATE 1250 (500 CA) MG PO TABS
1250.0000 mg | ORAL_TABLET | Freq: Every day | ORAL | Status: DC
  Administered 2017-01-08 – 2017-01-11 (×4): 1250 mg via ORAL
  Filled 2017-01-08 (×4): qty 1

## 2017-01-08 MED ORDER — HYDRALAZINE HCL 20 MG/ML IJ SOLN
5.0000 mg | INTRAMUSCULAR | Status: DC | PRN
  Administered 2017-01-09 – 2017-01-10 (×2): 5 mg via INTRAVENOUS
  Filled 2017-01-08 (×3): qty 0.25

## 2017-01-08 MED ORDER — NAPROXEN 500 MG PO TABS
500.0000 mg | ORAL_TABLET | Freq: Two times a day (BID) | ORAL | Status: DC
  Administered 2017-01-08 – 2017-01-11 (×7): 500 mg via ORAL
  Filled 2017-01-08 (×7): qty 1

## 2017-01-08 MED ORDER — PREGABALIN 50 MG PO CAPS
100.0000 mg | ORAL_CAPSULE | Freq: Two times a day (BID) | ORAL | Status: DC
  Administered 2017-01-08 – 2017-01-11 (×7): 100 mg via ORAL
  Filled 2017-01-08: qty 2
  Filled 2017-01-08: qty 4
  Filled 2017-01-08 (×5): qty 2

## 2017-01-08 MED ORDER — ATENOLOL 25 MG PO TABS
25.0000 mg | ORAL_TABLET | Freq: Every morning | ORAL | Status: DC
  Administered 2017-01-08 – 2017-01-11 (×4): 25 mg via ORAL
  Filled 2017-01-08 (×4): qty 1

## 2017-01-08 MED ORDER — ADULT MULTIVITAMIN W/MINERALS CH
1.0000 | ORAL_TABLET | Freq: Every day | ORAL | Status: DC
  Administered 2017-01-08 – 2017-01-11 (×4): 1 via ORAL
  Filled 2017-01-08 (×5): qty 1

## 2017-01-08 MED ORDER — ASPIRIN EC 81 MG PO TBEC
81.0000 mg | DELAYED_RELEASE_TABLET | Freq: Every day | ORAL | Status: DC
  Administered 2017-01-08 – 2017-01-11 (×4): 81 mg via ORAL
  Filled 2017-01-08 (×4): qty 1

## 2017-01-08 MED ORDER — ENOXAPARIN SODIUM 40 MG/0.4ML ~~LOC~~ SOLN
40.0000 mg | SUBCUTANEOUS | Status: DC
  Administered 2017-01-08 – 2017-01-11 (×4): 40 mg via SUBCUTANEOUS
  Filled 2017-01-08 (×4): qty 0.4

## 2017-01-08 MED ORDER — POLYETHYLENE GLYCOL 3350 17 G PO PACK: 17.0000 g | PACK | Freq: Every day | ORAL | Status: DC | PRN

## 2017-01-08 MED ORDER — SIMETHICONE 80 MG PO CHEW
80.0000 mg | CHEWABLE_TABLET | Freq: Four times a day (QID) | ORAL | Status: DC | PRN
  Administered 2017-01-10: 80 mg via ORAL
  Filled 2017-01-08: qty 1

## 2017-01-08 MED ORDER — PRAVASTATIN SODIUM 40 MG PO TABS
40.0000 mg | ORAL_TABLET | Freq: Every day | ORAL | Status: DC
  Administered 2017-01-08 – 2017-01-10 (×3): 40 mg via ORAL
  Filled 2017-01-08 (×3): qty 1

## 2017-01-08 MED ORDER — LORAZEPAM 0.5 MG PO TABS: 0.5000 mg | ORAL_TABLET | Freq: Three times a day (TID) | ORAL | Status: DC

## 2017-01-08 MED ORDER — MORPHINE SULFATE (PF) 2 MG/ML IV SOLN
1.0000 mg | INTRAVENOUS | Status: DC | PRN
  Administered 2017-01-08 – 2017-01-10 (×4): 1 mg via INTRAVENOUS
  Filled 2017-01-08 (×4): qty 1

## 2017-01-08 MED ORDER — ROPINIROLE HCL 0.5 MG PO TABS
0.5000 mg | ORAL_TABLET | Freq: Every day | ORAL | Status: DC
  Administered 2017-01-08 – 2017-01-10 (×3): 0.5 mg via ORAL
  Filled 2017-01-08 (×3): qty 1

## 2017-01-08 MED ORDER — PANCRELIPASE (LIP-PROT-AMYL) 12000-38000 UNITS PO CPEP
24000.0000 [IU] | ORAL_CAPSULE | Freq: Three times a day (TID) | ORAL | Status: DC
  Administered 2017-01-08 – 2017-01-11 (×10): 24000 [IU] via ORAL
  Filled 2017-01-08 (×10): qty 2

## 2017-01-08 MED ORDER — MORPHINE SULFATE (PF) 2 MG/ML IV SOLN
4.0000 mg | Freq: Once | INTRAVENOUS | Status: AC
  Administered 2017-01-08: 4 mg via INTRAVENOUS
  Filled 2017-01-08: qty 2

## 2017-01-08 MED ORDER — FLUOXETINE HCL 20 MG PO CAPS
20.0000 mg | ORAL_CAPSULE | Freq: Every day | ORAL | Status: DC
  Administered 2017-01-08 – 2017-01-11 (×4): 20 mg via ORAL
  Filled 2017-01-08 (×4): qty 1

## 2017-01-08 MED ORDER — OXYCODONE-ACETAMINOPHEN 5-325 MG PO TABS
1.0000 | ORAL_TABLET | ORAL | Status: DC | PRN
  Administered 2017-01-08 – 2017-01-11 (×9): 1 via ORAL
  Filled 2017-01-08 (×9): qty 1

## 2017-01-08 MED ORDER — VITAMIN B-12 1000 MCG PO TABS
2000.0000 ug | ORAL_TABLET | Freq: Every day | ORAL | Status: DC
  Administered 2017-01-08 – 2017-01-11 (×4): 2000 ug via ORAL
  Filled 2017-01-08 (×4): qty 2

## 2017-01-08 MED ORDER — PANTOPRAZOLE SODIUM 40 MG PO TBEC
40.0000 mg | DELAYED_RELEASE_TABLET | Freq: Two times a day (BID) | ORAL | Status: DC
  Administered 2017-01-08 – 2017-01-11 (×7): 40 mg via ORAL
  Filled 2017-01-08 (×7): qty 1

## 2017-01-08 MED ORDER — DULOXETINE HCL 30 MG PO CPEP
30.0000 mg | ORAL_CAPSULE | Freq: Two times a day (BID) | ORAL | Status: DC
  Administered 2017-01-08 – 2017-01-11 (×7): 30 mg via ORAL
  Filled 2017-01-08 (×7): qty 1

## 2017-01-08 MED ORDER — RISAQUAD PO CAPS
1.0000 | ORAL_CAPSULE | Freq: Two times a day (BID) | ORAL | Status: DC
  Administered 2017-01-08 – 2017-01-11 (×7): 1 via ORAL
  Filled 2017-01-08 (×7): qty 1

## 2017-01-08 MED ORDER — ZOLPIDEM TARTRATE 5 MG PO TABS: 5.0000 mg | ORAL_TABLET | Freq: Every evening | ORAL | Status: DC | PRN

## 2017-01-08 NOTE — H&P (Signed)
History and Physical    [name] JGG:[id number] DOB: 09-06-1940 DOA: 01/07/2017  Referring MD/NP/PA:   PCP: Briscoe Deutscher, DO   Patient coming from:  The patient is coming from home.  At baseline, pt is independent for most of ADL    Chief Complaint: worsening pelvic pain  HPI: [name] is a 76 y.o. female with medical history significant of frequent fall with multiple bony fracture, tardive dyskinesia, mitral valve prolapse, hypertension, stroke, TIA, hypothyroidism, depression, anxiety, pancreatic cancer (Whipple procedure, no radiation and chemotherapy), who presents with worsening pelvic pain.  Patient has been having frequent fall in the past 2 years with multiple bony fracture. She has right pubic ramus fracture, right forearm fracture with splint and nasal bonefracture. She just went home from rehabilitation facility 2 weeks ago per his son. Pt states that she has worsening pain in bilateral pelvis, worse on the left side. The pain is constant, severe, sharp, 10 out of 10 in severity, radiating to both legs. She does not have leg weakness. Patient does not have headache or neck pain. Denies chest pain, shortness breath, cough, fever, chills. She has nausea, no vomiting, diarrhea or abdominal pain. Denies symptoms of UTI. Her son states that the patient did not have new fall since 01/04/17. Pt dose not have HA or neck pain. She had negative CT-head and CT of C spin for acute bony issues on 01/05/17.  ED Course: pt was found to have tentatively urinalysis, electrolytes renal function okay, temperature normal, O2 sat 95% on room air. X-ray of L spin showed acute minimally displaced fracture of the left sacral ala. Pt is admitted to telemetry bed as inpatient. Patient needed rehabilitation placement.  Review of Systems:   General: no fevers, chills, no body weight gain,  has fatigue HEENT: no blurry vision, hearing changes or sore throat Respiratory: no dyspnea, coughing,  wheezing CV: no chest pain, no palpitations GI: no nausea, vomiting, abdominal pain, diarrhea, constipation GU: no dysuria, burning on urination, increased urinary frequency, hematuria  Ext: has mild leg edema Neuro: no unilateral weakness, numbness, or tingling, no vision change or hearing loss Skin: no rash, no skin tear. MSK: Has pain in lower back, bilateral hips, radiating to both legs. Heme: No easy bruising.  Travel history: No recent long distant travel.  Allergy:  Allergies  Allergen Reactions  . Other Other (See Comments)    According to patient Trilafor and Reglan cause Tardive Dyskinesia  . Metoclopramide Hcl Other (See Comments)    Shaking; caused tardive dyskinesia  . Niacin Other (See Comments)    shaking  . Trovan [Alatrofloxacin Mesylate] Other (See Comments)    Caused shaking and nervousness  . Benzocaine-Resorcinol Rash  . Celecoxib Other (See Comments)    unknown  . Erythromycin Base Rash  . Glucosamine Other (See Comments)    unknown  . Nortriptyline Other (See Comments)    Dizziness   . Phenazopyridine Hcl Other (See Comments)    unknown  . Sulfa Antibiotics Nausea And Vomiting and Rash  . Sulfonamide Derivatives Nausea And Vomiting and Rash    Past Medical History:  Diagnosis Date  . Allergic rhinitis   . Cancer ([place]) 07/10/13   Pancreatic cancer with MRI scan 06-19-13  . Chronic maxillary sinusitis    neti pot  . Depression    alone a lot  . Eustachian tube dysfunction   . GERD (gastroesophageal reflux disease)   . Glaucoma   . Heart murmur  hx. "MVP" -predental antibiotics  . Hiatal hernia   . Hypertension   . Hypothyroid   . Memory loss    short term memory loss  . Mitral valve prolapse    antibiotics before dental procedures  . Neuropathy   . Osteoarthritis   . Stroke [facility])    mini storkes left leg paraylsis. patient denies weakness 01/08/14.   . Tardive dyskinesia    possibly reglan, vitamin E helps  . Urgency of urination     some UTI in past    Past Surgical History:  Procedure Laterality Date  . 1 baker cyst removed    . ABDOMINAL HYSTERECTOMY     including ovaries  . BACK SURGERY     fusion  . BLEPHAROPLASTY Bilateral    with cataract surgery  . BREAST EXCISIONAL BIOPSY     left x2  . BREAST SURGERY     Biopsy left 2 times  . COLONOSCOPY W/ POLYPECTOMY     2004 last colonoscopy, no polyps  . DILATION AND CURETTAGE OF UTERUS     x3  . ESOPHAGOGASTRODUODENOSCOPY N/A 09/11/2013   Procedure: ESOPHAGOGASTRODUODENOSCOPY (EGD);  Surgeon: Cleotis Nipper, MD;  Location: [facility] ENDOSCOPY;  Service: Endoscopy;  Laterality: N/A;  Moderate sedation okay if MAC not available  . EUS N/A 07/10/2013   Procedure: ESOPHAGEAL ENDOSCOPIC ULTRASOUND (EUS) RADIAL;  Surgeon: Arta Silence, MD;  Location: [facility] ENDOSCOPY;  Service: Endoscopy;  Laterality: N/A;  . EYE SURGERY Right    cataract  . FINE NEEDLE ASPIRATION N/A 07/10/2013   Procedure: FINE NEEDLE ASPIRATION (FNA) LINEAR;  Surgeon: Arta Silence, MD;  Location: [facility] ENDOSCOPY;  Service: Endoscopy;  Laterality: N/A;  possible fna  . HARDWARE REMOVAL Right 12/15/2016   Procedure: Hardware removal and tenolysis right wrist with repair reconstruction as necessary;  Surgeon: Roseanne Kaufman, MD;  Location: [place];  Service: Orthopedics;  Laterality: Right;  60 mins  . JOINT REPLACEMENT     LTKA  . KNEE SURGERY Left    x 5, total knee Left knee  . LAPAROSCOPY N/A 08/07/2013   Procedure: LAPAROSCOPY DIAGNOSTIC;  Surgeon: [name], MD;  Location: [place];  Service: General;  Laterality: N/A;  . LUMBAR SPINE SURGERY     x2  . LUMBAR SPINE SURGERY     cyst  . ORIF ANKLE FRACTURE Right 08/16/2015   Procedure: OPEN REDUCTION INTERNAL FIXATION (ORIF) ANKLE FRACTURE;  Surgeon: Meredith Pel, MD;  Location: [place];  Service: Orthopedics;  Laterality: Right;  . ORIF WRIST FRACTURE Right 11/01/2016   Procedure: OPEN REDUCTION INTERNAL FIXATION (ORIF) RIGHT WRIST FRACTURE WITH  APPLICATION OF SPANNING PLATE, IRRIGATION AND DEBRIDEMENT RIGHT WRIST;  Surgeon: Roseanne Kaufman, MD;  Location: [facility];  Service: Orthopedics;  Laterality: Right;  . RADIOACTIVE SEED GUIDED EXCISIONAL BREAST BIOPSY Left 12/15/2015   Procedure: LEFT RADIOACTIVE SEED GUIDED EXCISIONAL BREAST BIOPSY;  Surgeon: [name], MD;  Location: [place];  Service: General;  Laterality: Left;  . WHIPPLE PROCEDURE N/A 08/07/2013   Procedure: WHIPPLE PROCEDURE;  Surgeon: [name], MD;  Location: [place];  Service: General;  Laterality: N/A;    Social History:  reports that she has never smoked. She has never used smokeless tobacco. She reports that she does not drink alcohol or use drugs.  Family History:  Family History  Problem Relation Age of Onset  . Cancer Mother        Breast Cancer with Metastatic disease  . Alzheimer's disease Father   . Other  Brother 58       GSW     Prior to Admission medications   Medication Sig Start Date End Date Taking? Authorizing Provider  aspirin 81 MG tablet Take 81 mg by mouth daily.   Yes [provider]  atenolol (TENORMIN) 25 MG tablet TAKE 1 TABLET BY MOUTH  EVERY MORNING 06/07/16  Yes Marin Olp, MD  busPIRone (BUSPAR) 15 MG tablet Take 15 mg by mouth at bedtime.   Yes [provider]  Calcium Carbonate (CALCIUM 600 PO) Take 1 tablet by mouth daily.   Yes [provider]  carboxymethylcellulose (REFRESH TEARS) 0.5 % SOLN Place 1 drop into both eyes 2 (two) times daily.    Yes [provider]  docusate sodium (COLACE) 100 MG capsule Take 200 mg by mouth at bedtime.   Yes [provider]  DULoxetine (CYMBALTA) 30 MG capsule Take 1 capsule (30 mg total) by mouth 2 (two) times daily. 04/04/16  Yes Kathrynn Ducking, MD  FLUoxetine (PROZAC) 20 MG capsule Take 20 mg by mouth daily.   Yes [provider]  fluticasone (FLONASE) 50 MCG/ACT nasal spray Place 2 sprays into both nostrils daily. 07/29/16  Yes Marin Olp, MD  furosemide (LASIX) 20 MG tablet Take 1 tablet (20 mg total) by mouth daily. 08/06/15  Yes Isaiah Serge, NP  latanoprost (XALATAN) 0.005 % ophthalmic solution Place 1 drop into both eyes nightly. 08/07/15  Yes [provider]  levothyroxine (SYNTHROID, LEVOTHROID) 200 MCG tablet Take 1 tablet (200 mcg total) by mouth daily before breakfast. 11/22/16 11/22/17 Yes Patrecia Pour, MD  lipase/protease/amylase (CREON) 12000 UNITS CPEP capsule Take 2 capsules (24,000 Units total) by mouth 3 (three) times daily before meals. 05/25/14  Yes Michael Boston, MD  LORazepam (ATIVAN) 0.5 MG tablet Take 1 tablet (0.5 mg total) by mouth every 8 (eight) hours. 11/22/16  Yes Patrecia Pour, MD  lovastatin (MEVACOR) 40 MG tablet TAKE 1 TABLET BY MOUTH AT  BEDTIME 06/07/16  Yes Marin Olp, MD  Multiple Vitamin (MULTIVITAMIN WITH MINERALS) TABS tablet Take 1 tablet by mouth daily.   Yes [provider]  naproxen (NAPROSYN) 500 MG tablet Take 1 tablet (500 mg total) by mouth 2 (two) times daily with a meal. 10/26/16  Yes Briscoe Deutscher, DO  omeprazole (PRILOSEC OTC) 20 MG tablet Take 20 mg by mouth 2 (two) times daily.    Yes [provider]  oxyCODONE (OXY IR/ROXICODONE) 5 MG immediate release tablet Take 5 mg by mouth every 4 (four) hours as needed for severe pain.   Yes [provider]  polycarbophil (FIBERCON) 625 MG tablet Take 1,250 mg by mouth daily.    Yes [provider]  potassium chloride (KLOR-CON 10) 10 MEQ tablet Take 1 tablet (10 mEq total) by mouth daily. 10/27/16  Yes Skeet Latch, MD  pregabalin (LYRICA) 100 MG capsule Take 1 capsule (100 mg total) by mouth 2 (two) times daily. Patient taking differently: Take 100 mg by mouth at bedtime. Take with Lyrica 300 to equal 400 mg 07/18/16  Yes Kathrynn Ducking, MD  pregabalin (LYRICA) 300 MG capsule Take 300 mg by mouth 2 (two) times daily.   Yes [provider]  Probiotic Product  (PROBIOTIC-10 PO) Take 1 tablet by mouth 2 (two) times daily.   Yes [provider]  psyllium (REGULOID) 0.52 g capsule Take 0.52 g by mouth daily.   Yes [provider]  rOPINIRole (REQUIP) 0.5  MG tablet Take 1 tablet (0.5 mg total) by mouth at bedtime. 05/11/15  Yes Dennie Bible, NP  simethicone (MYLICON) 893 MG chewable tablet Chew 125 mg by mouth every 6 (six) hours as needed for flatulence.   Yes [provider]  vitamin B-12 (CYANOCOBALAMIN) 1000 MCG tablet Take 2,000 mcg by mouth daily.   Yes [provider]  Vitamin D, Ergocalciferol, (DRISDOL) 50000 units CAPS capsule Take 1 capsule (50,000 Units total) by mouth every 7 (seven) days. Patient taking differently: Take 50,000 Units by mouth every 7 (seven) days. On Wednesdays 10/31/16  Yes Briscoe Deutscher, DO  vitamin E 400 UNIT capsule Take 800 Units by mouth 2 (two) times daily. For tartive dyskinesia   Yes [provider]  cephALEXin (KEFLEX) 500 MG capsule Take 1 capsule (500 mg total) by mouth 4 (four) times daily. 01/07/17   Nat Christen, MD  ciprofloxacin (CIPRO) 500 MG tablet Take 500 mg by mouth 2 (two) times daily.    [provider]  oxyCODONE-acetaminophen (PERCOCET/ROXICET) 5-325 MG tablet Take 1 tablet by mouth every 4 (four) hours as needed for severe pain. 01/07/17   Nat Christen, MD  predniSONE (DELTASONE) 10 MG tablet Take 1 tablet (10 mg total) by mouth daily with breakfast. 1 tablet twice a day for 4 days; one tablet once a day for 4 days 01/07/17   Nat Christen, MD    Physical Exam: Vitals:   01/08/17 0013 01/08/17 0242 01/08/17 0436 01/08/17 0517  BP: (!) 150/83 (!) 161/74 (!) 170/83 (!) 176/84  Pulse: 92 90 96 84  Resp: _0 Temp:    98.2 F (36.8 C)  TempSrc:    Oral  SpO2: 96% 95% 100% 96%  Weight:    64.6 kg (142 lb 6.7 oz)  Height:    _1  (1.549 m)   General: Not in acute distress HEENT:       Eyes: PERRL, EOMI, no scleral icterus.        ENT: No discharge from the ears and nose, no pharynx injection, no tonsillar enlargement.        Neck: No JVD, no bruit, no mass felt. Heme: No neck lymph node enlargement. Cardiac: S1/S2, RRR, No murmurs, No gallops or rubs. Respiratory: No rales, wheezing, rhonchi or rubs. GI: Soft, nondistended, nontender, no rebound pain, no organomegaly, BS present. GU: No hematuria Ext: has trace leg edema bilaterally. 2+DP/PT pulse bilaterally. Musculoskeletal: Has tenderness in  in lower back, bilateral hips Skin: No rashes.  Neuro: Alert, oriented X3, cranial nerves II-XII grossly intact, moves all extremities normally.  Psych: Patient is not psychotic, no suicidal or hemocidal ideation.  Labs on Admission: I have personally reviewed following labs and imaging studies  CBC:  Recent Labs Lab 01/07/17 1730  WBC 7.3  HGB 12.4  HCT 37.2  MCV 93.5  PLT 810*   Basic Metabolic Panel:  Recent Labs Lab 01/07/17 1730  NA 142  K 3.5  CL 107  CO2 25  GLUCOSE 82  BUN 19  CREATININE 0.64  CALCIUM 9.0   GFR: Estimated Creatinine Clearance: 50.7 mL/min (by C-G formula based on SCr of 0.64 mg/dL). Liver Function Tests:  Recent Labs Lab 01/07/17 1730  AST 26  ALT 19  ALKPHOS 151*  BILITOT 1.1  PROT 7.0  ALBUMIN 4.0   No results for input(s): LIPASE, AMYLASE in the last 168 hours. No results for input(s): AMMONIA in the last 168 hours. Coagulation Profile: No results for  input(s): INR, PROTIME in the last 168 hours. Cardiac Enzymes: No results for input(s): CKTOTAL, CKMB, CKMBINDEX, TROPONINI in the last 168 hours. BNP (last 3 results) No results for input(s): PROBNP in the last 8760 hours. HbA1C: No results for input(s): HGBA1C in the last 72 hours. CBG: No results for input(s): GLUCAP in the last 168 hours. Lipid Profile: No results for input(s): CHOL, HDL, LDLCALC, TRIG, CHOLHDL, LDLDIRECT in the last 72 hours. Thyroid Function Tests: No results for input(s): TSH,  T4TOTAL, FREET4, T3FREE, THYROIDAB in the last 72 hours. Anemia Panel: No results for input(s): VITAMINB12, FOLATE, FERRITIN, TIBC, IRON, RETICCTPCT in the last 72 hours. Urine analysis:    Component Value Date/Time   COLORURINE YELLOW 01/07/2017 2335   APPEARANCEUR CLEAR 01/07/2017 2335   LABSPEC 1.015 01/07/2017 2335   PHURINE 5.0 01/07/2017 2335   GLUCOSEU NEGATIVE 01/07/2017 2335   GLUCOSEU NEGATIVE 07/30/2012 1410   HGBUR NEGATIVE 01/07/2017 2335   BILIRUBINUR NEGATIVE 01/07/2017 2335   BILIRUBINUR Negative 10/26/2016 1059   KETONESUR 20 (A) 01/07/2017 2335   PROTEINUR NEGATIVE 01/07/2017 2335   UROBILINOGEN 0.2 10/26/2016 1059   UROBILINOGEN 0.2 02/15/2014 1445   NITRITE NEGATIVE 01/07/2017 2335   LEUKOCYTESUR NEGATIVE 01/07/2017 2335   Sepsis Labs: _0 (procalcitonin:4,lacticidven:4) )No results found for this or any previous visit (from the past 240 hour(s)).   Radiological Exams on Admission: Ct Lumbar Spine Wo Contrast  Result Date: 01/07/2017 CLINICAL DATA:  76 y/o F; multiple falls with severe pelvic pain radiating to the lower extremities. EXAM: CT LUMBAR SPINE WITHOUT CONTRAST TECHNIQUE: Multidetector CT imaging of the lumbar spine was performed without intravenous contrast administration. Multiplanar CT image reconstructions were also generated. COMPARISON:  11/21/2016 lumbar spine radiographs. 03/09/2015 CT abdomen and pelvis. FINDINGS: Segmentation: 5 lumbar type vertebrae. Alignment: Stable grade 1 L4-5 anterolisthesis. Vertebrae: Acute minimally displaced fracture of the left sacral ala (series 3, image 95). Bones are diffusely demineralized. Posterior instrumented fusion at L4 through S1 and laminectomy with intact hardware. No appreciable periprosthetic lucency or fracture. Stable central superior endplate chronic fracture of L3 vertebral body without significant loss of height. Stable mild loss of height of the posterior aspect of L5 vertebral body. No new  acute lumbar spine fracture or loss of vertebral body height identified. Paraspinal and other soft tissues: Mild calcific atherosclerosis of the abdominal aorta. Disc levels: Streak artifact from posterior instrumentation and interbody fusion hardware partially obscures the spinal canal and neural foramen from L4 through S1. Lumbar degenerative changes are greatest at L3-4 there appears to be a disc bulge with facet and ligamentum flavum hypertrophy resulting in at least moderate canal stenosis, mild left, and moderate to severe right foraminal narrowing. IMPRESSION: 1. Acute minimally displaced fracture of the left sacral ala. 2. Stable grade 1 L4-5 anterolisthesis, L3 central superior endplate deformity without loss of vertebral body height, and mild posterior loss of height of L5 vertebral body. 3. L4-S1 posterior instrumented fusion without apparent hardware related complication. 4. Lumbar degenerative changes greatest at the L3-4 level where there is multifactorial at least moderate canal stenosis with mild left/ moderate to severe right foraminal stenosis. Electronically Signed   By: Kristine Garbe M.D.   On: 01/07/2017 22:33     EKG:  Not done in ED, will get one.   Assessment/Plan Principal Problem:   Fall Active Problems:   Hypothyroidism   Essential hypertension   GERD   Osteoporosis   HLD (hyperlipidemia)   Closed fracture of ramus of right pubis (HCC)  Aftercare for healing traumatic fracture of radius, right, closed   Sacral fracture, closed ([employer])   Back pain   Thrombocytopenia ([place])   Frequent fall and multiple bone fractures: Patient has chronic frequent fall in the past 2 years. Etiology is not clear, possibly due to tardive dyskinesia. She has right pubic ramus fracture, right forearm fracture with splint, nasal bone fracture and now found to have fx of left sacral ala. She has severe pain. Pt is living with her son at home, but it is difficult to be taken care. Will  need rehab placement. Pt is followed by ortho, Dr. Amedeo Plenty for her right forearm fracture.  -Will admit to tele bed as inpt. - Pain control: morphine prn and percocet - When necessary Zofran for nausea - Robaxin for muscle spasm - pt/ot -consult to SW and CM  Hypothyroidism: Last TSH was 9.812 on 11/22/16 -Continue home Synthroid -Check TSH  Essential hypertension:  -continue Atenolol -IV hydralazine when necessary  GERD: -Protonix  HLD: -Pravastatin  Thrombocytopenia ([place]): platelet 126. No bleeding tendency -Follow-up by CBC  Depression and anxiety: Stable, no suicidal or homicidal ideations. -Continue home medications: BuSpar, Cymbalta, Requip, when necessary Ativan   DVT ppx: SQ Lovenox Code Status: Full code Family Communication: Yes, patient's son at bed side Disposition Plan:  Anticipate discharge back to previous home environment Consults called:  none Admission status: Inpatient/tele    Date of Service 01/08/2017    Ivor Costa [facility] Pager [phone]  If 7PM-7AM, please contact night-coverage [email or web address] Password [facility] 01/08/2017, 6:13 AM

## 2017-01-08 NOTE — Progress Notes (Signed)
PROGRESS NOTE                                                                                                                                                                                                             Patient Demographics:    [name], is a 76 y.o. female, DOB - [birth year], ENI:[id number]  Admit date - 01/07/2017   Admitting Physician Ivor Costa, MD  Outpatient Primary MD for the patient is Briscoe Deutscher, DO  LOS - 0   Chief Complaint  Patient presents with  . Hip Pain  . Leg Pain       Brief Narrative   This is a no charge note for patient admitted earlier today by Dr.Xilin 76 y.o. female with medical history significant of frequent fall with multiple bony fracture, tardive dyskinesia, mitral valve prolapse, hypertension, stroke, TIA, hypothyroidism, depression, anxiety, pancreatic cancer (Whipple procedure, no radiation and chemotherapy), who presents with worsening pelvic pain. Workup significant for a new left sacral alla fracture.  Subjective:    [name] today has, No headache, No chest pain, No abdominal pain - He complains of lower back pain .   Assessment  & Plan :    Principal Problem:   Fall Active Problems:   Hypothyroidism   Essential hypertension   GERD   Osteoporosis   HLD (hyperlipidemia)   Closed fracture of ramus of right pubis (HCC)   Aftercare for healing traumatic fracture of radius, right, closed   Sacral fracture, closed (HCC)   Back pain   Thrombocytopenia (HCC)   Frequent fall and multiple bone fractures:  - Patient has chronic frequent fall in the past 2 years. Etiology is not clear, possibly due to tardive dyskinesia. She has right pubic ramus fracture, right forearm fracture with splint, nasal bone fracture  -  now found to have fx of left sacral ala. She has severe pain. Been seen by Dr. Lorin Mercy in the past, discussed with his partner Dr. Ninfa Linden, this is nonoperative fracture,  recommendation is for PT and weightbearing as tolerated .Pt is living with her son at home, but it is difficult to be taken care. Will need rehab placement. Pt is followed by ortho, Dr. Amedeo Plenty for her right forearm fracture.  Hypothyroidism:  - Last TSH was 9.812 on 11/22/16 and her dose has been increased then, repeat  TSH has improved to 5.47, will continue with current dose of Synthroid without any changes giving it is in the right direction, recommend repeating TSH in 4 weeks, and if it is still elevated then, then I would increase her Synthroid.  Essential hypertension:  -continue Atenolol -IV hydralazine when necessary  GERD: -Protonix  HLD: -Pravastatin  Thrombocytopenia ([place]): platelet 126. No bleeding tendency -Follow-up by CBC  Depression and anxiety: Stable, no suicidal or homicidal ideations. -Continue home medications: BuSpar, Cymbalta, Requip, when necessary Ativan     Code Status : Full  Family Communication  : none at bedside  Disposition Plan  : will need SNF   Consults  :  none  Procedures  : none  DVT Prophylaxis  :  Lovenox   Lab Results  Component Value Date   PLT 126 (L) 01/07/2017    Antibiotics  :   Anti-infectives    Start     Dose/Rate Route Frequency Ordered Stop   01/07/17 0000  cephALEXin (KEFLEX) 500 MG capsule     500 mg Oral 4 times daily 01/07/17 2349          Objective:   Vitals:   01/08/17 0013 01/08/17 0242 01/08/17 0436 01/08/17 0517  BP: (!) 150/83 (!) 161/74 (!) 170/83 (!) 176/84  Pulse: 92 90 96 84  Resp: 18 18 18 20   Temp:    98.2 F (36.8 C)  TempSrc:    Oral  SpO2: 96% 95% 100% 96%  Weight:    64.6 kg (142 lb 6.7 oz)  Height:    5\' 1"  (1.549 m)    Wt Readings from Last 3 Encounters:  01/08/17 64.6 kg (142 lb 6.7 oz)  01/04/17 68 kg (150 lb)  12/20/16 67.2 kg (148 lb 3.2 oz)    No intake or output data in the 24 hours ending 01/08/17 1501   Physical Exam  Awake Alert, Oriented X 3,  Symmetrical  Chest wall movement, Good air movement bilaterally, CTAB RRR,No Gallops,Rubs or new Murmurs, No Parasternal Heave +ve B.Sounds, Abd Soft,  No rebound - guarding or rigidity. No Cyanosis, Clubbing or edema, Right arm in a cast, has tenderness on lower back    Data Review:    CBC  Recent Labs Lab 01/07/17 1730  WBC 7.3  HGB 12.4  HCT 37.2  PLT 126*  MCV 93.5  MCH 31.2  MCHC 33.3  RDW 14.6    Chemistries   Recent Labs Lab 01/07/17 1730  NA 142  K 3.5  CL 107  CO2 25  GLUCOSE 82  BUN 19  CREATININE 0.64  CALCIUM 9.0  AST 26  ALT 19  ALKPHOS 151*  BILITOT 1.1   ------------------------------------------------------------------------------------------------------------------ No results for input(s): CHOL, HDL, LDLCALC, TRIG, CHOLHDL, LDLDIRECT in the last 72 hours.  Lab Results  Component Value Date   HGBA1C 4.3 (L) 11/22/2016   ------------------------------------------------------------------------------------------------------------------  Recent Labs  01/07/17 1738  TSH 5.473*   ------------------------------------------------------------------------------------------------------------------ No results for input(s): VITAMINB12, FOLATE, FERRITIN, TIBC, IRON, RETICCTPCT in the last 72 hours.  Coagulation profile No results for input(s): INR, PROTIME in the last 168 hours.  No results for input(s): DDIMER in the last 72 hours.  Cardiac Enzymes No results for input(s): CKMB, TROPONINI, MYOGLOBIN in the last 168 hours.  Invalid input(s): CK ------------------------------------------------------------------------------------------------------------------ No results found for: BNP  Inpatient Medications  Scheduled Meds: . acidophilus  1 capsule Oral BID  . aspirin EC  81 mg Oral Daily  . atenolol  25 mg  Oral q morning - 10a  . busPIRone  15 mg Oral QHS  . calcium carbonate  1,250 mg Oral Q breakfast  . docusate sodium  200 mg Oral QHS  .  DULoxetine  30 mg Oral BID  . enoxaparin (LOVENOX) injection  40 mg Subcutaneous Q24H  . FLUoxetine  20 mg Oral Daily  . fluticasone  2 spray Each Nare Daily  . latanoprost  1 drop Both Eyes QHS  . levothyroxine  200 mcg Oral QAC breakfast  . lipase/protease/amylase  24,000 Units Oral TID AC  . multivitamin with minerals  1 tablet Oral Daily  . naproxen  500 mg Oral BID WC  . pantoprazole  40 mg Oral BID  . polycarbophil  1,250 mg Oral Daily  . polyvinyl alcohol  1 drop Both Eyes BID  . pravastatin  40 mg Oral q1800  . pregabalin  100 mg Oral BID  . psyllium  1 packet Oral Daily  . rOPINIRole  0.5 mg Oral QHS  . vitamin B-12  2,000 mcg Oral Daily  . vitamin E  800 Units Oral BID   Continuous Infusions: PRN Meds:.hydrALAZINE, LORazepam, methocarbamol, morphine injection, ondansetron, oxyCODONE-acetaminophen, polyethylene glycol, simethicone, zolpidem  Micro Results No results found for this or any previous visit (from the past 240 hour(s)).  Radiology Reports Dg Pelvis 1-2 Views  Result Date: 01/05/2017 CLINICAL DATA:  Patient fell with bruising of the left tibia and fibula. Bilateral hip pain. EXAM: PELVIS - 1-2 VIEW COMPARISON:  11/21/2016 CT FINDINGS: Subacute right-sided superior and inferior pubic rami fractures with subtle callus formation is noted. The superior pubic ramus fracture is more apparent than on prior CT and may have declared itself since. Both hip joints are maintained. No proximal femoral fracture. Spinal fusion hardware noted of the lumbosacral spine from approximately L4 through S1. No pelvic diastasis. IMPRESSION: 1. Subacute fractures of the right superior and inferior pubic rami with subtle callus formation. 2. Posterior lumbar spinal fusion hardware noted from L4 through S1. Electronically Signed   By: Ashley Royalty M.D.   On: 01/05/2017 01:06   Dg Tibia/fibula Left  Result Date: 01/05/2017 CLINICAL DATA:  Left leg pain. Patient fell with bruising of the distal  left tibia and fibula. EXAM: LEFT TIBIA AND FIBULA - 2 VIEW COMPARISON:  None. 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 No fracture of the tibia nor fibula is identified. There is mild generalized soft tissue induration and swelling of the leg. IMPRESSION: Negative for acute fracture dislocation of the left tibia fibula. Mild soft tissue swelling. Electronically Signed   By: Ashley Royalty M.D.   On: 01/05/2017 01:10   Ct Head Wo Contrast  Result Date: 01/05/2017 CLINICAL DATA:  Status post fall face forward, landing on nose. Concern for head or cervical spine injury. Initial encounter. EXAM: CT HEAD WITHOUT CONTRAST CT MAXILLOFACIAL WITHOUT CONTRAST CT CERVICAL SPINE WITHOUT CONTRAST TECHNIQUE: Multidetector CT imaging of the head, cervical spine, and maxillofacial structures were performed using the standard protocol without intravenous contrast. Multiplanar CT image reconstructions of the cervical spine and maxillofacial structures were also generated. COMPARISON:  CT of the head performed 11/21/2016, and CT of the cervical spine performed 11/01/2016. MRI of the brain performed 02/26/2015 FINDINGS: CT HEAD FINDINGS Brain: No evidence of acute infarction, hemorrhage, hydrocephalus, extra-axial collection or mass lesion/mass effect. Prominence of the ventricles and sulci reflects moderate cortical volume loss. Cerebellar atrophy is noted. Scattered periventricular and subcortical white matter change  likely reflects small vessel ischemic microangiopathy. The brainstem and fourth ventricle are within normal limits. The basal ganglia are unremarkable in appearance. The cerebral hemispheres demonstrate grossly normal gray-white differentiation. No mass effect or midline shift is seen. Vascular: No hyperdense vessel or unexpected calcification. Skull: There is no evidence of fracture; visualized osseous structures are unremarkable in appearance. Other: No significant  soft tissue abnormalities are seen. CT MAXILLOFACIAL FINDINGS Osseous: There appears to be a minimally displaced fracture of the left side of the nasal bone. The maxilla and mandible appear intact. Orbits: The orbits are intact bilaterally. Sinuses: The visualized paranasal sinuses and mastoid air cells are well-aerated. Soft tissues: No significant soft tissue abnormalities are seen. The parapharyngeal fat planes are preserved. The nasopharynx, oropharynx and hypopharynx are unremarkable in appearance. The visualized portions of the valleculae and piriform sinuses are grossly unremarkable. The parotid and submandibular glands are within normal limits. No cervical lymphadenopathy is seen. CT CERVICAL SPINE FINDINGS Alignment: Normal. Skull base and vertebrae: No acute fracture. No primary bone lesion or focal pathologic process. Soft tissues and spinal canal: No prevertebral fluid or swelling. No visible canal hematoma. Disc levels: Intervertebral disc space narrowing is noted at C5-C6. Degenerative change is noted about the dens. Upper chest: The thyroid gland is diminutive but grossly unremarkable. The visualized lung bases are clear. Other: No additional soft tissue abnormalities are seen. IMPRESSION: 1. No evidence of traumatic intracranial injury. 2. Minimally displaced fracture of the left side of the nasal bone. 3. No evidence of fracture or subluxation along the cervical spine. 4. Moderate cortical volume loss and scattered small vessel ischemic microangiopathy. 5. Minimal degenerative change along the cervical spine. Electronically Signed   By: Garald Balding M.D.   On: 01/05/2017 00:57   Ct Cervical Spine Wo Contrast  Result Date: 01/05/2017 CLINICAL DATA:  Status post fall face forward, landing on nose. Concern for head or cervical spine injury. Initial encounter. EXAM: CT HEAD WITHOUT CONTRAST CT MAXILLOFACIAL WITHOUT CONTRAST CT CERVICAL SPINE WITHOUT CONTRAST TECHNIQUE: Multidetector CT imaging of  the head, cervical spine, and maxillofacial structures were performed using the standard protocol without intravenous contrast. Multiplanar CT image reconstructions of the cervical spine and maxillofacial structures were also generated. COMPARISON:  CT of the head performed 11/21/2016, and CT of the cervical spine performed 11/01/2016. MRI of the brain performed 02/26/2015 FINDINGS: CT HEAD FINDINGS Brain: No evidence of acute infarction, hemorrhage, hydrocephalus, extra-axial collection or mass lesion/mass effect. Prominence of the ventricles and sulci reflects moderate cortical volume loss. Cerebellar atrophy is noted. Scattered periventricular and subcortical white matter change likely reflects small vessel ischemic microangiopathy. The brainstem and fourth ventricle are within normal limits. The basal ganglia are unremarkable in appearance. The cerebral hemispheres demonstrate grossly normal gray-white differentiation. No mass effect or midline shift is seen. Vascular: No hyperdense vessel or unexpected calcification. Skull: There is no evidence of fracture; visualized osseous structures are unremarkable in appearance. Other: No significant soft tissue abnormalities are seen. CT MAXILLOFACIAL FINDINGS Osseous: There appears to be a minimally displaced fracture of the left side of the nasal bone. The maxilla and mandible appear intact. Orbits: The orbits are intact bilaterally. Sinuses: The visualized paranasal sinuses and mastoid air cells are well-aerated. Soft tissues: No significant soft tissue abnormalities are seen. The parapharyngeal fat planes are preserved. The nasopharynx, oropharynx and hypopharynx are unremarkable in appearance. The visualized portions of the valleculae and piriform sinuses are grossly unremarkable. The parotid and submandibular glands are within normal limits. No  cervical lymphadenopathy is seen. CT CERVICAL SPINE FINDINGS Alignment: Normal. Skull base and vertebrae: No acute  fracture. No primary bone lesion or focal pathologic process. Soft tissues and spinal canal: No prevertebral fluid or swelling. No visible canal hematoma. Disc levels: Intervertebral disc space narrowing is noted at C5-C6. Degenerative change is noted about the dens. Upper chest: The thyroid gland is diminutive but grossly unremarkable. The visualized lung bases are clear. Other: No additional soft tissue abnormalities are seen. IMPRESSION: 1. No evidence of traumatic intracranial injury. 2. Minimally displaced fracture of the left side of the nasal bone. 3. No evidence of fracture or subluxation along the cervical spine. 4. Moderate cortical volume loss and scattered small vessel ischemic microangiopathy. 5. Minimal degenerative change along the cervical spine. Electronically Signed   By: Garald Balding M.D.   On: 01/05/2017 00:57   Ct Lumbar Spine Wo Contrast  Result Date: 01/07/2017 CLINICAL DATA:  76 y/o F; multiple falls with severe pelvic pain radiating to the lower extremities. EXAM: CT LUMBAR SPINE WITHOUT CONTRAST TECHNIQUE: Multidetector CT imaging of the lumbar spine was performed without intravenous contrast administration. Multiplanar CT image reconstructions were also generated. COMPARISON:  11/21/2016 lumbar spine radiographs. 03/09/2015 CT abdomen and pelvis. FINDINGS: Segmentation: 5 lumbar type vertebrae. Alignment: Stable grade 1 L4-5 anterolisthesis. Vertebrae: Acute minimally displaced fracture of the left sacral ala (series 3, image 95). Bones are diffusely demineralized. Posterior instrumented fusion at L4 through S1 and laminectomy with intact hardware. No appreciable periprosthetic lucency or fracture. Stable central superior endplate chronic fracture of L3 vertebral body without significant loss of height. Stable mild loss of height of the posterior aspect of L5 vertebral body. No new acute lumbar spine fracture or loss of vertebral body height identified. Paraspinal and other soft  tissues: Mild calcific atherosclerosis of the abdominal aorta. Disc levels: Streak artifact from posterior instrumentation and interbody fusion hardware partially obscures the spinal canal and neural foramen from L4 through S1. Lumbar degenerative changes are greatest at L3-4 there appears to be a disc bulge with facet and ligamentum flavum hypertrophy resulting in at least moderate canal stenosis, mild left, and moderate to severe right foraminal narrowing. IMPRESSION: 1. Acute minimally displaced fracture of the left sacral ala. 2. Stable grade 1 L4-5 anterolisthesis, L3 central superior endplate deformity without loss of vertebral body height, and mild posterior loss of height of L5 vertebral body. 3. L4-S1 posterior instrumented fusion without apparent hardware related complication. 4. Lumbar degenerative changes greatest at the L3-4 level where there is multifactorial at least moderate canal stenosis with mild left/ moderate to severe right foraminal stenosis. Electronically Signed   By: Kristine Garbe M.D.   On: 01/07/2017 22:33   Ct Maxillofacial Wo Contrast  Result Date: 01/05/2017 CLINICAL DATA:  Status post fall face forward, landing on nose. Concern for head or cervical spine injury. Initial encounter. EXAM: CT HEAD WITHOUT CONTRAST CT MAXILLOFACIAL WITHOUT CONTRAST CT CERVICAL SPINE WITHOUT CONTRAST TECHNIQUE: Multidetector CT imaging of the head, cervical spine, and maxillofacial structures were performed using the standard protocol without intravenous contrast. Multiplanar CT image reconstructions of the cervical spine and maxillofacial structures were also generated. COMPARISON:  CT of the head performed 11/21/2016, and CT of the cervical spine performed 11/01/2016. MRI of the brain performed 02/26/2015 FINDINGS: CT HEAD FINDINGS Brain: No evidence of acute infarction, hemorrhage, hydrocephalus, extra-axial collection or mass lesion/mass effect. Prominence of the ventricles and sulci  reflects moderate cortical volume loss. Cerebellar atrophy is noted. Scattered periventricular and subcortical  white matter change likely reflects small vessel ischemic microangiopathy. The brainstem and fourth ventricle are within normal limits. The basal ganglia are unremarkable in appearance. The cerebral hemispheres demonstrate grossly normal gray-white differentiation. No mass effect or midline shift is seen. Vascular: No hyperdense vessel or unexpected calcification. Skull: There is no evidence of fracture; visualized osseous structures are unremarkable in appearance. Other: No significant soft tissue abnormalities are seen. CT MAXILLOFACIAL FINDINGS Osseous: There appears to be a minimally displaced fracture of the left side of the nasal bone. The maxilla and mandible appear intact. Orbits: The orbits are intact bilaterally. Sinuses: The visualized paranasal sinuses and mastoid air cells are well-aerated. Soft tissues: No significant soft tissue abnormalities are seen. The parapharyngeal fat planes are preserved. The nasopharynx, oropharynx and hypopharynx are unremarkable in appearance. The visualized portions of the valleculae and piriform sinuses are grossly unremarkable. The parotid and submandibular glands are within normal limits. No cervical lymphadenopathy is seen. CT CERVICAL SPINE FINDINGS Alignment: Normal. Skull base and vertebrae: No acute fracture. No primary bone lesion or focal pathologic process. Soft tissues and spinal canal: No prevertebral fluid or swelling. No visible canal hematoma. Disc levels: Intervertebral disc space narrowing is noted at C5-C6. Degenerative change is noted about the dens. Upper chest: The thyroid gland is diminutive but grossly unremarkable. The visualized lung bases are clear. Other: No additional soft tissue abnormalities are seen. IMPRESSION: 1. No evidence of traumatic intracranial injury. 2. Minimally displaced fracture of the left side of the nasal bone. 3. No  evidence of fracture or subluxation along the cervical spine. 4. Moderate cortical volume loss and scattered small vessel ischemic microangiopathy. 5. Minimal degenerative change along the cervical spine. Electronically Signed   By: Garald Balding M.D.   On: 01/05/2017 00:57    Time Spent in minutes  no charge   [facility], Terren Jandreau M.D on 01/08/2017 at 3:01 PM  Between 7am to 7pm - Pager - [phone]  After 7pm go to [email or web address] - password [facility]  [facility] -  Office  [phone]

## 2017-01-08 NOTE — Progress Notes (Signed)
Call report to RN John at [phone] . Number is [phone]

## 2017-01-08 NOTE — ED Provider Notes (Signed)
Patient signed out pending urinalysis. Urinalysis without evidence of UTI. However, patient has had persistent pain in is unable to ambulate at her baseline secondary to pain. She has had multiple recurrent falls. Feel she needs admission and for pain control and physical therapy/occupational therapy. Redosed pain medication.   Merryl Hacker, MD 01/08/17 [phone]
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
## 2017-01-09 ENCOUNTER — Encounter (HOSPITAL_COMMUNITY): Payer: Self-pay

## 2017-01-09 DIAGNOSIS — M5440 Lumbago with sciatica, unspecified side: Secondary | ICD-10-CM

## 2017-01-09 LAB — URINE CULTURE: Culture: NO GROWTH

## 2017-01-09 MED ORDER — POTASSIUM CHLORIDE CRYS ER 20 MEQ PO TBCR
40.0000 meq | EXTENDED_RELEASE_TABLET | Freq: Once | ORAL | Status: AC
  Administered 2017-01-09: 40 meq via ORAL
  Filled 2017-01-09: qty 2

## 2017-01-09 NOTE — Clinical Social Work Note (Signed)
Clinical Social Work Assessment  Patient Details  Name: [name] MRN: [id number] Date of Birth: 28-Dec-1940  Date of referral:  01/09/17               Reason for consult:  Facility Placement                Permission sought to share information with:  Facility Art therapist granted to share information::  Yes, Verbal Permission Granted  Name::        Agency::     Relationship::     Contact Information:     Housing/Transportation Living arrangements for the past 2 months:  [place], [place] (Patient recently discharged from SNF approx. 2 weeks ago to son's house) Source of Information:  Patient, Adult Children (Son Anae Hams [phone])) Patient Interpreter Needed:  None Criminal Activity/Legal Involvement Pertinent to Current Situation/Hospitalization:  No - Comment as needed Significant Relationships:  Adult Children Lives with:  Adult Children Do you feel safe going back to the place where you live?  Yes Need for family participation in patient care:  Yes (Comment)  Care giving concerns:  Patient from home with patient's son and girlfriend. Patient's son reported that 2 days ago patient started experiencing weakness in lower extremities with pain and could no longer stand. PT recommended SNF for ST rehab.    Social Worker assessment / plan:  CSW spoke with patient and patient's son Mckenlee Mangham) at bedside regarding PT recommendation for SNF. Patient/patient's son reported that they were agreeable to SNF and wanted to return to [facility]. Patient reported that her second option was [facility] SN[facility]. Patient's son reported that patient has been residing with her other son and his girlfriend after being discharged for SNF approx. 2 weeks ago. Patient's son reported that patient was currently in copay days and that patient's secondary insurance had been covering the copay for patient's stay at [facility]. Patient's son reported that  he has to go out of town Wednesday and that his wife [name] [phone]) would be assisting with patient's discharge to SNF. CSW completed FL2 and reached out to [facility], awaiting return call. CSW will confirm patient's ability to return, inquire about copay days and provide patient with an update.   Employment status:  Retired Forensic scientist:  Medicare PT Recommendations:  [place] / Referral to community resources:  [place]  Patient/Family's Response to care:  Patient and patient's son appreciative of CSW intervention.   Patient/Family's Understanding of and Emotional Response to Diagnosis, Current Treatment, and Prognosis:  Patient presented pleasant throughout assessment. Patient/patient's son verbalized understanding pf diagnosis, current treatment and prognosis. Patient's son supportive and involved in patient's care.   Emotional Assessment Appearance:  Appears stated age Attitude/Demeanor/Rapport:  Other (Cooperative) Affect (typically observed):  Pleasant Orientation:  Oriented to Self, Oriented to Place, Oriented to  Time, Oriented to Situation Alcohol / Substance use:  Not Applicable Psych involvement (Current and /or in the community):  No (Comment)  Discharge Needs  Concerns to be addressed:  Care Coordination Readmission within the last 30 days:  No Current discharge risk:  None Barriers to Discharge:  No Barriers Identified   Burnis Medin, LCSW 01/09/2017, 3:48 PM

## 2017-01-09 NOTE — Progress Notes (Signed)
PROGRESS NOTE                                                                                                                                                                                                             Patient Demographics:    [name], is a 76 y.o. female, DOB - 1940/06/24, YWV:[id number]  Admit date - 01/07/2017   Admitting Physician Ivor Costa, MD  Outpatient Primary MD for the patient is Briscoe Deutscher, DO  LOS - 1   Chief Complaint  Patient presents with  . Hip Pain  . Leg Pain       Brief Narrative   This is a no charge note for patient admitted earlier today by Dr.Xilin 76 y.o. female with medical history significant of frequent fall with multiple bony fracture, tardive dyskinesia, mitral valve prolapse, hypertension, stroke, TIA, hypothyroidism, depression, anxiety, pancreatic cancer (Whipple procedure, no radiation and chemotherapy), who presents with worsening pelvic pain. Workup significant for a new left sacral alla fracture.  Subjective:    [name]  patient states that pain is stable, she denies any cp, sob    Assessment  & Plan :    Principal Problem:   Fall Active Problems:   Hypothyroidism   Essential hypertension   GERD   Osteoporosis   HLD (hyperlipidemia)   Closed fracture of ramus of right pubis [facility])   Aftercare for healing traumatic fracture of radius, right, closed   Sacral fracture, closed (HCC)   Back pain   Thrombocytopenia (HCC)   Frequent fall and multiple bone fractures:  - Patient has chronic frequent fall in the past 2 years. Etiology is not clear, possibly due to tardive dyskinesia. She has right pubic ramus fracture, right forearm fracture with splint, nasal bone fracture  -  now found to have fx of left sacral ala. She has severe pain. Been seen by Dr. Lorin Mercy in the past, discussed with his partner Dr. Ninfa Linden, this is nonoperative fracture, recommendation is for PT and  weightbearing as tolerated .Pt is living with her son at home, but it is difficult to be taken care. SNF bed placement pending. Pt is followed by ortho, Dr. Amedeo Plenty for her right forearm fracture.  Hypothyroidism:  - Last TSH was 9.812 on 11/22/16 and her dose has been increased then, repeat TSH has improved to 5.47,  will continue with current dose of Synthroid without any changes giving it is in the right direction, recommend repeating TSH in 4 weeks, and if it is still elevated then, then I would increase her Synthroid.  Essential hypertension:  -continue Atenolol -IV hydralazine when necessary  GERD: -Protonix  HLD: -Pravastatin  Thrombocytopenia ([place]): platelet 126. No bleeding tendency -Follow-up by CBC  Depression and anxiety: Stable, no suicidal or homicidal ideations. -Continue home medications: BuSpar, Cymbalta, Requip, when necessary Ativan     Code Status : Full  Family Communication  : none at bedside  Disposition Plan  : will need SNF   Consults  :  none  Procedures  : none  DVT Prophylaxis  :  Lovenox   Lab Results  Component Value Date   PLT 126 (L) 01/07/2017    Antibiotics  :   Anti-infectives    Start     Dose/Rate Route Frequency Ordered Stop   01/07/17 0000  cephALEXin (KEFLEX) 500 MG capsule     500 mg Oral 4 times daily 01/07/17 2349          Objective:   Vitals:   01/08/17 1528 01/08/17 2136 01/09/17 0649 01/09/17 0946  BP: (!) 157/78 (!) 172/78 (!) 158/61 (!) 155/68  Pulse: 63 69 69 75  Resp: 20 20 18    Temp: 98.2 F (36.8 C) 98.2 F (36.8 C) 97.6 F (36.4 C)   TempSrc: Oral Oral Oral   SpO2: 95% 100% 94%   Weight:      Height:        Wt Readings from Last 3 Encounters:  01/08/17 64.6 kg (142 lb 6.7 oz)  01/04/17 68 kg (150 lb)  12/20/16 67.2 kg (148 lb 3.2 oz)     Intake/Output Summary (Last 24 hours) at 01/09/17 1233 Last data filed at 01/09/17 0805  Gross per 24 hour  Intake              860 ml  Output                 0 ml  Net              860 ml     Physical Exam  Awake Alert, Oriented X 3,  Symmetrical Chest wall movement, Good air movement bilaterally, CTAB RRR,No Gallops,Rubs or new Murmurs, No Parasternal Heave +ve B.Sounds, Abd Soft,  No rebound - guarding or rigidity. No Cyanosis, Clubbing or edema, Right arm in a cast, has tenderness on lower back    Data Review:    CBC  Recent Labs Lab 01/07/17 1730  WBC 7.3  HGB 12.4  HCT 37.2  PLT 126*  MCV 93.5  MCH 31.2  MCHC 33.3  RDW 14.6    Chemistries   Recent Labs Lab 01/07/17 1730  NA 142  K 3.5  CL 107  CO2 25  GLUCOSE 82  BUN 19  CREATININE 0.64  CALCIUM 9.0  AST 26  ALT 19  ALKPHOS 151*  BILITOT 1.1   ------------------------------------------------------------------------------------------------------------------ No results for input(s): CHOL, HDL, LDLCALC, TRIG, CHOLHDL, LDLDIRECT in the last 72 hours.  Lab Results  Component Value Date   HGBA1C 4.3 (L) 11/22/2016   ------------------------------------------------------------------------------------------------------------------  Recent Labs  01/07/17 1738  TSH 5.473*   ------------------------------------------------------------------------------------------------------------------ No results for input(s): VITAMINB12, FOLATE, FERRITIN, TIBC, IRON, RETICCTPCT in the last 72 hours.  Coagulation profile No results for input(s): INR, PROTIME in the last 168 hours.  No results for input(s): DDIMER in the  last 72 hours.  Cardiac Enzymes No results for input(s): CKMB, TROPONINI, MYOGLOBIN in the last 168 hours.  Invalid input(s): CK ------------------------------------------------------------------------------------------------------------------ No results found for: BNP  Inpatient Medications  Scheduled Meds: . acidophilus  1 capsule Oral BID  . aspirin EC  81 mg Oral Daily  . atenolol  25 mg Oral q morning - 10a  . busPIRone  15 mg  Oral QHS  . calcium carbonate  1,250 mg Oral Q breakfast  . docusate sodium  200 mg Oral QHS  . DULoxetine  30 mg Oral BID  . enoxaparin (LOVENOX) injection  40 mg Subcutaneous Q24H  . FLUoxetine  20 mg Oral Daily  . fluticasone  2 spray Each Nare Daily  . latanoprost  1 drop Both Eyes QHS  . levothyroxine  200 mcg Oral QAC breakfast  . lipase/protease/amylase  24,000 Units Oral TID AC  . multivitamin with minerals  1 tablet Oral Daily  . naproxen  500 mg Oral BID WC  . pantoprazole  40 mg Oral BID  . polycarbophil  1,250 mg Oral Daily  . polyvinyl alcohol  1 drop Both Eyes BID  . potassium chloride  40 mEq Oral Once  . pravastatin  40 mg Oral q1800  . pregabalin  100 mg Oral BID  . psyllium  1 packet Oral Daily  . rOPINIRole  0.5 mg Oral QHS  . vitamin B-12  2,000 mcg Oral Daily  . vitamin E  800 Units Oral BID   Continuous Infusions: PRN Meds:.hydrALAZINE, LORazepam, methocarbamol, morphine injection, ondansetron, oxyCODONE-acetaminophen, polyethylene glycol, simethicone, zolpidem  Micro Results Recent Results (from the past 240 hour(s))  Urine culture     Status: None   Collection Time: 01/07/17 11:49 PM  Result Value Ref Range Status   Specimen Description URINE, RANDOM  Final   Special Requests NONE  Final   Culture   Final    NO GROWTH Performed at [facility], [place] [address], [place], [place] [address]    Report Status 01/09/2017 FINAL  Final    Radiology Reports Dg Pelvis 1-2 Views  Result Date: 01/05/2017 CLINICAL DATA:  Patient fell with bruising of the left tibia and fibula. Bilateral hip pain. EXAM: PELVIS - 1-2 VIEW COMPARISON:  11/21/2016 CT FINDINGS: Subacute right-sided superior and inferior pubic rami fractures with subtle callus formation is noted. The superior pubic ramus fracture is more apparent than on prior CT and may have declared itself since. Both hip joints are maintained. No proximal femoral fracture. Spinal fusion hardware noted of the  lumbosacral spine from approximately L4 through S1. No pelvic diastasis. IMPRESSION: 1. Subacute fractures of the right superior and inferior pubic rami with subtle callus formation. 2. Posterior lumbar spinal fusion hardware noted from L4 through S1. Electronically Signed   By: Ashley Royalty M.D.   On: 01/05/2017 01:06   Dg Tibia/fibula Left  Result Date: 01/05/2017 CLINICAL DATA:  Left leg pain. Patient fell with bruising of the distal left tibia and fibula. EXAM: LEFT TIBIA AND FIBULA - 2 VIEW COMPARISON:  None. 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 No fracture of the tibia nor fibula is identified. There is mild generalized soft tissue induration and swelling of the leg. IMPRESSION: Negative for acute fracture dislocation of the left tibia fibula. Mild soft tissue swelling. Electronically Signed   By: Ashley Royalty M.D.   On: 01/05/2017 01:10   Ct Head Wo Contrast  Result Date: 01/05/2017 CLINICAL  DATA:  Status post fall face forward, landing on nose. Concern for head or cervical spine injury. Initial encounter. EXAM: CT HEAD WITHOUT CONTRAST CT MAXILLOFACIAL WITHOUT CONTRAST CT CERVICAL SPINE WITHOUT CONTRAST TECHNIQUE: Multidetector CT imaging of the head, cervical spine, and maxillofacial structures were performed using the standard protocol without intravenous contrast. Multiplanar CT image reconstructions of the cervical spine and maxillofacial structures were also generated. COMPARISON:  CT of the head performed 11/21/2016, and CT of the cervical spine performed 11/01/2016. MRI of the brain performed 02/26/2015 FINDINGS: CT HEAD FINDINGS Brain: No evidence of acute infarction, hemorrhage, hydrocephalus, extra-axial collection or mass lesion/mass effect. Prominence of the ventricles and sulci reflects moderate cortical volume loss. Cerebellar atrophy is noted. Scattered periventricular and subcortical white matter change likely reflects small vessel  ischemic microangiopathy. The brainstem and fourth ventricle are within normal limits. The basal ganglia are unremarkable in appearance. The cerebral hemispheres demonstrate grossly normal gray-white differentiation. No mass effect or midline shift is seen. Vascular: No hyperdense vessel or unexpected calcification. Skull: There is no evidence of fracture; visualized osseous structures are unremarkable in appearance. Other: No significant soft tissue abnormalities are seen. CT MAXILLOFACIAL FINDINGS Osseous: There appears to be a minimally displaced fracture of the left side of the nasal bone. The maxilla and mandible appear intact. Orbits: The orbits are intact bilaterally. Sinuses: The visualized paranasal sinuses and mastoid air cells are well-aerated. Soft tissues: No significant soft tissue abnormalities are seen. The parapharyngeal fat planes are preserved. The nasopharynx, oropharynx and hypopharynx are unremarkable in appearance. The visualized portions of the valleculae and piriform sinuses are grossly unremarkable. The parotid and submandibular glands are within normal limits. No cervical lymphadenopathy is seen. CT CERVICAL SPINE FINDINGS Alignment: Normal. Skull base and vertebrae: No acute fracture. No primary bone lesion or focal pathologic process. Soft tissues and spinal canal: No prevertebral fluid or swelling. No visible canal hematoma. Disc levels: Intervertebral disc space narrowing is noted at C5-C6. Degenerative change is noted about the dens. Upper chest: The thyroid gland is diminutive but grossly unremarkable. The visualized lung bases are clear. Other: No additional soft tissue abnormalities are seen. IMPRESSION: 1. No evidence of traumatic intracranial injury. 2. Minimally displaced fracture of the left side of the nasal bone. 3. No evidence of fracture or subluxation along the cervical spine. 4. Moderate cortical volume loss and scattered small vessel ischemic microangiopathy. 5. Minimal  degenerative change along the cervical spine. Electronically Signed   By: Garald Balding M.D.   On: 01/05/2017 00:57   Ct Cervical Spine Wo Contrast  Result Date: 01/05/2017 CLINICAL DATA:  Status post fall face forward, landing on nose. Concern for head or cervical spine injury. Initial encounter. EXAM: CT HEAD WITHOUT CONTRAST CT MAXILLOFACIAL WITHOUT CONTRAST CT CERVICAL SPINE WITHOUT CONTRAST TECHNIQUE: Multidetector CT imaging of the head, cervical spine, and maxillofacial structures were performed using the standard protocol without intravenous contrast. Multiplanar CT image reconstructions of the cervical spine and maxillofacial structures were also generated. COMPARISON:  CT of the head performed 11/21/2016, and CT of the cervical spine performed 11/01/2016. MRI of the brain performed 02/26/2015 FINDINGS: CT HEAD FINDINGS Brain: No evidence of acute infarction, hemorrhage, hydrocephalus, extra-axial collection or mass lesion/mass effect. Prominence of the ventricles and sulci reflects moderate cortical volume loss. Cerebellar atrophy is noted. Scattered periventricular and subcortical white matter change likely reflects small vessel ischemic microangiopathy. The brainstem and fourth ventricle are within normal limits. The basal ganglia are unremarkable in appearance. The cerebral hemispheres demonstrate  grossly normal gray-white differentiation. No mass effect or midline shift is seen. Vascular: No hyperdense vessel or unexpected calcification. Skull: There is no evidence of fracture; visualized osseous structures are unremarkable in appearance. Other: No significant soft tissue abnormalities are seen. CT MAXILLOFACIAL FINDINGS Osseous: There appears to be a minimally displaced fracture of the left side of the nasal bone. The maxilla and mandible appear intact. Orbits: The orbits are intact bilaterally. Sinuses: The visualized paranasal sinuses and mastoid air cells are well-aerated. Soft tissues: No  significant soft tissue abnormalities are seen. The parapharyngeal fat planes are preserved. The nasopharynx, oropharynx and hypopharynx are unremarkable in appearance. The visualized portions of the valleculae and piriform sinuses are grossly unremarkable. The parotid and submandibular glands are within normal limits. No cervical lymphadenopathy is seen. CT CERVICAL SPINE FINDINGS Alignment: Normal. Skull base and vertebrae: No acute fracture. No primary bone lesion or focal pathologic process. Soft tissues and spinal canal: No prevertebral fluid or swelling. No visible canal hematoma. Disc levels: Intervertebral disc space narrowing is noted at C5-C6. Degenerative change is noted about the dens. Upper chest: The thyroid gland is diminutive but grossly unremarkable. The visualized lung bases are clear. Other: No additional soft tissue abnormalities are seen. IMPRESSION: 1. No evidence of traumatic intracranial injury. 2. Minimally displaced fracture of the left side of the nasal bone. 3. No evidence of fracture or subluxation along the cervical spine. 4. Moderate cortical volume loss and scattered small vessel ischemic microangiopathy. 5. Minimal degenerative change along the cervical spine. Electronically Signed   By: Garald Balding M.D.   On: 01/05/2017 00:57   Ct Lumbar Spine Wo Contrast  Result Date: 01/07/2017 CLINICAL DATA:  76 y/o F; multiple falls with severe pelvic pain radiating to the lower extremities. EXAM: CT LUMBAR SPINE WITHOUT CONTRAST TECHNIQUE: Multidetector CT imaging of the lumbar spine was performed without intravenous contrast administration. Multiplanar CT image reconstructions were also generated. COMPARISON:  11/21/2016 lumbar spine radiographs. 03/09/2015 CT abdomen and pelvis. FINDINGS: Segmentation: 5 lumbar type vertebrae. Alignment: Stable grade 1 L4-5 anterolisthesis. Vertebrae: Acute minimally displaced fracture of the left sacral ala (series 3, image 95). Bones are diffusely  demineralized. Posterior instrumented fusion at L4 through S1 and laminectomy with intact hardware. No appreciable periprosthetic lucency or fracture. Stable central superior endplate chronic fracture of L3 vertebral body without significant loss of height. Stable mild loss of height of the posterior aspect of L5 vertebral body. No new acute lumbar spine fracture or loss of vertebral body height identified. Paraspinal and other soft tissues: Mild calcific atherosclerosis of the abdominal aorta. Disc levels: Streak artifact from posterior instrumentation and interbody fusion hardware partially obscures the spinal canal and neural foramen from L4 through S1. Lumbar degenerative changes are greatest at L3-4 there appears to be a disc bulge with facet and ligamentum flavum hypertrophy resulting in at least moderate canal stenosis, mild left, and moderate to severe right foraminal narrowing. IMPRESSION: 1. Acute minimally displaced fracture of the left sacral ala. 2. Stable grade 1 L4-5 anterolisthesis, L3 central superior endplate deformity without loss of vertebral body height, and mild posterior loss of height of L5 vertebral body. 3. L4-S1 posterior instrumented fusion without apparent hardware related complication. 4. Lumbar degenerative changes greatest at the L3-4 level where there is multifactorial at least moderate canal stenosis with mild left/ moderate to severe right foraminal stenosis. Electronically Signed   By: Kristine Garbe M.D.   On: 01/07/2017 22:33   Ct Maxillofacial Wo Contrast  Result Date:  01/05/2017 CLINICAL DATA:  Status post fall face forward, landing on nose. Concern for head or cervical spine injury. Initial encounter. EXAM: CT HEAD WITHOUT CONTRAST CT MAXILLOFACIAL WITHOUT CONTRAST CT CERVICAL SPINE WITHOUT CONTRAST TECHNIQUE: Multidetector CT imaging of the head, cervical spine, and maxillofacial structures were performed using the standard protocol without intravenous contrast.  Multiplanar CT image reconstructions of the cervical spine and maxillofacial structures were also generated. COMPARISON:  CT of the head performed 11/21/2016, and CT of the cervical spine performed 11/01/2016. MRI of the brain performed 02/26/2015 FINDINGS: CT HEAD FINDINGS Brain: No evidence of acute infarction, hemorrhage, hydrocephalus, extra-axial collection or mass lesion/mass effect. Prominence of the ventricles and sulci reflects moderate cortical volume loss. Cerebellar atrophy is noted. Scattered periventricular and subcortical white matter change likely reflects small vessel ischemic microangiopathy. The brainstem and fourth ventricle are within normal limits. The basal ganglia are unremarkable in appearance. The cerebral hemispheres demonstrate grossly normal gray-white differentiation. No mass effect or midline shift is seen. Vascular: No hyperdense vessel or unexpected calcification. Skull: There is no evidence of fracture; visualized osseous structures are unremarkable in appearance. Other: No significant soft tissue abnormalities are seen. CT MAXILLOFACIAL FINDINGS Osseous: There appears to be a minimally displaced fracture of the left side of the nasal bone. The maxilla and mandible appear intact. Orbits: The orbits are intact bilaterally. Sinuses: The visualized paranasal sinuses and mastoid air cells are well-aerated. Soft tissues: No significant soft tissue abnormalities are seen. The parapharyngeal fat planes are preserved. The nasopharynx, oropharynx and hypopharynx are unremarkable in appearance. The visualized portions of the valleculae and piriform sinuses are grossly unremarkable. The parotid and submandibular glands are within normal limits. No cervical lymphadenopathy is seen. CT CERVICAL SPINE FINDINGS Alignment: Normal. Skull base and vertebrae: No acute fracture. No primary bone lesion or focal pathologic process. Soft tissues and spinal canal: No prevertebral fluid or swelling. No  visible canal hematoma. Disc levels: Intervertebral disc space narrowing is noted at C5-C6. Degenerative change is noted about the dens. Upper chest: The thyroid gland is diminutive but grossly unremarkable. The visualized lung bases are clear. Other: No additional soft tissue abnormalities are seen. IMPRESSION: 1. No evidence of traumatic intracranial injury. 2. Minimally displaced fracture of the left side of the nasal bone. 3. No evidence of fracture or subluxation along the cervical spine. 4. Moderate cortical volume loss and scattered small vessel ischemic microangiopathy. 5. Minimal degenerative change along the cervical spine. Electronically Signed   By: Garald Balding M.D.   On: 01/05/2017 00:57    Time Spent in minutes  no charge   Reyne Dumas M.D on 01/09/2017 at 12:33 PM  Between 7am to 7pm - Pager - [phone]  After 7pm go to [email or web address] - password [facility]  [facility] -  Office  [phone]
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
## 2017-01-09 NOTE — NC FL2 (Signed)
[place] LEVEL OF CARE SCREENING TOOL     IDENTIFICATION  Patient Name: [name] Birthdate: 10-Nov-1940 Sex: female Admission Date (Current Location): 01/07/2017  [facility] and [place] Number:  Herbalist and Address:  [facility],  [place] [address], [place]      Provider Number: [id number]  Attending Physician Name and Address:  Reyne Dumas, MD  Relative Name and Phone Number:       Current Level of Care: Hospital Recommended Level of Care: [place] Prior Approval Number:    Date Approved/Denied:   PASRR Number: [id number]  Discharge Plan: SNF    Current Diagnoses: Patient Active Problem List   Diagnosis Date Noted  . Fall at home, initial encounter 01/07/2017  . Sacral fracture, closed ([place]) 01/07/2017  . Back pain 01/07/2017  . Thrombocytopenia ([place]) 01/07/2017  . Aftercare for healing traumatic fracture of radius, right, closed 12/15/2016  . Closed fracture of ramus of right pubis ([place]) 11/22/2016  . Fall 11/21/2016  . Open Colles' fracture of right radius 11/02/2016  . Pigmented villonodular synovitis of knee, left 03/22/2016  . Abnormal radionuclide bone scan 08/16/2015  . Ankle fracture, bimalleolar, closed 08/16/2015  . HLD (hyperlipidemia) 07/28/2015  . Fatty liver 09/09/2014  . Lumbago 07/07/2014  . Abnormality of gait 03/27/2014  . Memory loss 01/08/2014  . 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 Carcinoma of head of pancreas ([place]) 07/01/2013  . 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 Paresthesia of foot 06/06/2012  . 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 Colon polyps 06/05/2008  . Osteoarthritis 06/04/2008  . Chronic diarrhea 04/22/2008  . Hypothyroidism 02/26/2007  . Adjustment disorder with mixed anxiety and depressed mood 02/26/2007  . Unspecified glaucoma 02/26/2007  . Essential hypertension 02/26/2007  . Mitral valve disease 02/26/2007  .  Allergic rhinitis 02/26/2007  . GERD 02/26/2007  . Hiatal hernia 02/26/2007  . Osteoporosis 02/26/2007    Orientation RESPIRATION BLADDER Height & Weight     Self, Time, Situation, Place  Normal Continent Weight: 142 lb 6.7 oz (64.6 kg) Height:  5\' 1"  (154.9 cm)  BEHAVIORAL SYMPTOMS/MOOD NEUROLOGICAL BOWEL NUTRITION STATUS        Diet (Heart)  AMBULATORY STATUS COMMUNICATION OF NEEDS Skin     Verbally Normal                       Personal Care Assistance Level of Assistance  Bathing, Feeding, Dressing Bathing Assistance: Maximum assistance Feeding assistance: Independent Dressing Assistance: Maximum assistance     Functional Limitations Info             SPECIAL CARE FACTORS FREQUENCY  PT (By licensed PT), OT (By licensed OT)     PT Frequency: 5x OT Frequency: 5x            Contractures Contractures Info: Not present    Additional Factors Info  Code Status, Allergies Code Status Info: Full Code Allergies Info: Benzocaine-resorcinol; Celecoxib; Erythromycin Base; Glucosamine; Phenazopyridine Hcl; Sulfa Antibiotics; Metoclopramide Hcl; Niacin; Trovan Alatrofloxacin Mesylate; Nortriptyline; Sulfonamide Derivatives;           Current Medications (01/09/2017):  This is the current hospital active medication list Current Facility-Administered Medications  Medication Dose Route Frequency Provider Last Rate Last Dose  . acidophilus (RISAQUAD) capsule 1 capsule  1 capsule Oral BID Ivor Costa, MD   1 capsule at 01/09/17 0948  . aspirin EC tablet 81 mg  81 mg Oral Daily Niu,  Soledad Gerlach, MD   81 mg at 01/09/17 0949  . atenolol (TENORMIN) tablet 25 mg  25 mg Oral q morning - 10a Ivor Costa, MD   25 mg at 01/09/17 0950  . busPIRone (BUSPAR) tablet 15 mg  15 mg Oral QHS Ivor Costa, MD   15 mg at 01/08/17 2114  . calcium carbonate (OS-CAL - dosed in mg of elemental calcium) tablet 1,250 mg  1,250 mg Oral Q breakfast Ivor Costa, MD   1,250 mg at 01/09/17 1740  . docusate  sodium (COLACE) capsule 200 mg  200 mg Oral QHS Ivor Costa, MD   200 mg at 01/08/17 2114  . DULoxetine (CYMBALTA) DR capsule 30 mg  30 mg Oral BID Ivor Costa, MD   30 mg at 01/09/17 0951  . enoxaparin (LOVENOX) injection 40 mg  40 mg Subcutaneous Q24H Ivor Costa, MD   40 mg at 01/09/17 1031  . FLUoxetine (PROZAC) capsule 20 mg  20 mg Oral Daily Ivor Costa, MD   20 mg at 01/09/17 0948  . fluticasone (FLONASE) 50 MCG/ACT nasal spray 2 spray  2 spray Each Nare Daily Ivor Costa, MD   2 spray at 01/09/17 0954  . hydrALAZINE (APRESOLINE) injection 5 mg  5 mg Intravenous Q2H PRN Ivor Costa, MD      . latanoprost (XALATAN) 0.005 % ophthalmic solution 1 drop  1 drop Both Eyes QHS Ivor Costa, MD   1 drop at 01/08/17 2115  . levothyroxine (SYNTHROID, LEVOTHROID) tablet 200 mcg  200 mcg Oral QAC breakfast Ivor Costa, MD   200 mcg at 01/09/17 0951  . lipase/protease/amylase (CREON) capsule 24,000 Units  24,000 Units Oral TID Renella Cunas, MD   24,000 Units at 01/09/17 1234  . LORazepam (ATIVAN) tablet 0.5 mg  0.5 mg Oral Q8H PRN Ivor Costa, MD   0.5 mg at 01/08/17 2114  . methocarbamol (ROBAXIN) tablet 500 mg  500 mg Oral Q8H PRN Ivor Costa, MD      . morphine 2 MG/ML injection 1 mg  1 mg Intravenous Q3H PRN Ivor Costa, MD   1 mg at 01/08/17 2126  . multivitamin with minerals tablet 1 tablet  1 tablet Oral Daily Ivor Costa, MD   1 tablet at 01/09/17 1030  . naproxen (NAPROSYN) tablet 500 mg  500 mg Oral BID WC Ivor Costa, MD   500 mg at 01/09/17 0948  . ondansetron (ZOFRAN) injection 4 mg  4 mg Intravenous Q8H PRN Ivor Costa, MD   4 mg at 01/08/17 1913  . oxyCODONE-acetaminophen (PERCOCET/ROXICET) 5-325 MG per tablet 1 tablet  1 tablet Oral Q4H PRN Ivor Costa, MD   1 tablet at 01/09/17 1459  . pantoprazole (PROTONIX) EC tablet 40 mg  40 mg Oral BID Ivor Costa, MD   40 mg at 01/09/17 0951  . polycarbophil (FIBERCON) tablet 1,250 mg  1,250 mg Oral Daily Ivor Costa, MD   1,250 mg at 01/09/17 0947  . polyethylene  glycol (MIRALAX / GLYCOLAX) packet 17 g  17 g Oral Daily PRN Ivor Costa, MD      . polyvinyl alcohol (LIQUIFILM TEARS) 1.4 % ophthalmic solution 1 drop  1 drop Both Eyes BID Ivor Costa, MD   1 drop at 01/09/17 0954  . pravastatin (PRAVACHOL) tablet 40 mg  40 mg Oral q1800 Ivor Costa, MD   40 mg at 01/08/17 1746  . pregabalin (LYRICA) capsule 100 mg  100 mg Oral BID Ivor Costa, MD   100 mg at 01/09/17 0948  .  psyllium (HYDROCIL/METAMUCIL) packet 1 packet  1 packet Oral Daily Ivor Costa, MD   1 packet at 01/09/17 [id number]  . rOPINIRole (REQUIP) tablet 0.5 mg  0.5 mg Oral QHS Ivor Costa, MD   0.5 mg at 01/08/17 2114  . simethicone (MYLICON) chewable tablet 80 mg  80 mg Oral Q6H PRN Ivor Costa, MD      . vitamin B-12 (CYANOCOBALAMIN) tablet 2,000 mcg  2,000 mcg Oral Daily Ivor Costa, MD   2,000 mcg at 01/09/17 0953  . vitamin E capsule 800 Units  800 Units Oral BID Ivor Costa, MD   800 Units at 01/09/17 0947  . zolpidem (AMBIEN) tablet 5 mg  5 mg Oral QHS PRN Ivor Costa, MD         Discharge Medications: Please see discharge summary for a list of discharge medications.  Relevant Imaging Results:  Relevant Lab Results:   Additional Information SSN [id number]  Burnis Medin, LCSW

## 2017-01-10 NOTE — Progress Notes (Signed)
CSW spoke with staff from [facility] and confirmed patient's bed offer. Staff reported that patient's son completed paperwork. CSW provided update to patient. Patient to be transported via [facility] at discharge. Patient reported that her daughter in law Dolly Harbach [phone]) will be the contact at discharge. CSW will continue to follow and assist with discharge planning.   Abundio Miu, [place] Social Worker [facility] Cell#: [phone]

## 2017-01-10 NOTE — Progress Notes (Signed)
PROGRESS NOTE                                                                                                                                                                                                             Patient Demographics:    [name], is a 76 y.o. female, DOB - [birth year], DVV:[id number]  Admit date - 01/07/2017   Admitting Physician Ivor Costa, MD  Outpatient Primary MD for the patient is Briscoe Deutscher, DO  LOS - 2   Chief Complaint  Patient presents with  . Hip Pain  . Leg Pain       Brief Narrative   This is a no charge note for patient admitted earlier today by Dr.Xilin 76 y.o. female with medical history significant of frequent fall with multiple bony fracture, tardive dyskinesia, mitral valve prolapse, hypertension, stroke, TIA, hypothyroidism, depression, anxiety, pancreatic cancer (Whipple procedure, no radiation and chemotherapy), who presents with worsening pelvic pain. Workup significant for a new left sacral alla fracture.   Subjective:    [name]  patient is currently having pain, as she has not asked for any pain meds today    Assessment  & Plan :    Principal Problem:   Fall Active Problems:   Hypothyroidism   Essential hypertension   GERD   Osteoporosis   HLD (hyperlipidemia)   Closed fracture of ramus of right pubis [facility])   Aftercare for healing traumatic fracture of radius, right, closed   Sacral fracture, closed ([place])   Back pain   Thrombocytopenia (HCC)   Frequent fall and multiple bone fractures:  - Patient has chronic frequent fall in the past 2 years. Etiology is not clear, possibly due to tardive dyskinesia. She has right pubic ramus fracture, right forearm fracture with splint, nasal bone fracture  -  now found to have fx of left sacral ala. She has severe pain. Been seen by Dr. Lorin Mercy in the past, discussed with his partner Dr. Ninfa Linden, this is nonoperative fracture, recommendation  is for PT and weightbearing as tolerated .Pt is living with her son at home, but it is difficult to be taken care. SNF bed placement  after completion of desired number of nights. Therefore discharge scheduled for 8/15. Pt is followed by ortho, Dr. Amedeo Plenty for her right forearm fracture.  Hypothyroidism:  - Last TSH  was 9.812 on 11/22/16 and her dose has been increased then, repeat TSH has improved to 5.47, will continue with current dose of Synthroid without any changes giving it is in the right direction, recommend repeating TSH in 4 weeks, and if it is still elevated then, then I would increase her Synthroid.  Essential hypertension:  -continue Atenolol -IV hydralazine when necessary  GERD: -Protonix  HLD: -Pravastatin  Thrombocytopenia ([place]): platelet 126. No bleeding tendency Repeat CBC tomorrow  Depression and anxiety: Stable, no suicidal or homicidal ideations. -Continue home medications: BuSpar, Cymbalta, Requip, when necessary Ativan  Hypokalemia-repleted   Code Status : Full  Family Communication  : none at bedside  Disposition Plan  : Anticipate discharge to [facility] 8/15  Consultation  none  Procedures  : none  DVT Prophylaxis  :  Lovenox   Lab Results  Component Value Date   PLT 126 (L) 01/07/2017    Antibiotics  :   Anti-infectives    Start     Dose/Rate Route Frequency Ordered Stop   01/07/17 0000  cephALEXin (KEFLEX) 500 MG capsule     500 mg Oral 4 times daily 01/07/17 2349          Objective:   Vitals:   01/09/17 2200 01/10/17 0000 01/10/17 0520 01/10/17 0611  BP:  (!) 169/74 (!) 181/78 (!) 160/65  Pulse: 65  70   Resp: 18  16   Temp: 98.5 F (36.9 C)  97.7 F (36.5 C)   TempSrc:   Oral   SpO2: 98%  95%   Weight:      Height:        Wt Readings from Last 3 Encounters:  01/08/17 64.6 kg (142 lb 6.7 oz)  01/04/17 68 kg (150 lb)  12/20/16 67.2 kg (148 lb 3.2 oz)     Intake/Output Summary (Last 24 hours) at 01/10/17 1234 Last  data filed at 01/10/17 0745  Gross per 24 hour  Intake              240 ml  Output                0 ml  Net              240 ml     Physical Exam  Awake Alert, Oriented X 3,  Symmetrical Chest wall movement, Good air movement bilaterally, CTAB RRR,No Gallops,Rubs or new Murmurs, No Parasternal Heave +ve B.Sounds, Abd Soft,  No rebound - guarding or rigidity. No Cyanosis, Clubbing or edema, Right arm in a cast, has tenderness on lower back    Data Review:    CBC  Recent Labs Lab 01/07/17 1730  WBC 7.3  HGB 12.4  HCT 37.2  PLT 126*  MCV 93.5  MCH 31.2  MCHC 33.3  RDW 14.6    Chemistries   Recent Labs Lab 01/07/17 1730  NA 142  K 3.5  CL 107  CO2 25  GLUCOSE 82  BUN 19  CREATININE 0.64  CALCIUM 9.0  AST 26  ALT 19  ALKPHOS 151*  BILITOT 1.1   ------------------------------------------------------------------------------------------------------------------ No results for input(s): CHOL, HDL, LDLCALC, TRIG, CHOLHDL, LDLDIRECT in the last 72 hours.  Lab Results  Component Value Date   HGBA1C 4.3 (L) 11/22/2016   ------------------------------------------------------------------------------------------------------------------  Recent Labs  01/07/17 1738  TSH 5.473*   ------------------------------------------------------------------------------------------------------------------ No results for input(s): VITAMINB12, FOLATE, FERRITIN, TIBC, IRON, RETICCTPCT in the last 72 hours.  Coagulation profile No results for input(s): INR, PROTIME in  the last 168 hours.  No results for input(s): DDIMER in the last 72 hours.  Cardiac Enzymes No results for input(s): CKMB, TROPONINI, MYOGLOBIN in the last 168 hours.  Invalid input(s): CK ------------------------------------------------------------------------------------------------------------------ No results found for: BNP  Inpatient Medications  Scheduled Meds: . acidophilus  1 capsule Oral BID  .  aspirin EC  81 mg Oral Daily  . atenolol  25 mg Oral q morning - 10a  . busPIRone  15 mg Oral QHS  . calcium carbonate  1,250 mg Oral Q breakfast  . docusate sodium  200 mg Oral QHS  . DULoxetine  30 mg Oral BID  . enoxaparin (LOVENOX) injection  40 mg Subcutaneous Q24H  . FLUoxetine  20 mg Oral Daily  . fluticasone  2 spray Each Nare Daily  . latanoprost  1 drop Both Eyes QHS  . levothyroxine  200 mcg Oral QAC breakfast  . lipase/protease/amylase  24,000 Units Oral TID AC  . multivitamin with minerals  1 tablet Oral Daily  . naproxen  500 mg Oral BID WC  . pantoprazole  40 mg Oral BID  . polycarbophil  1,250 mg Oral Daily  . polyvinyl alcohol  1 drop Both Eyes BID  . pravastatin  40 mg Oral q1800  . pregabalin  100 mg Oral BID  . psyllium  1 packet Oral Daily  . rOPINIRole  0.5 mg Oral QHS  . vitamin B-12  2,000 mcg Oral Daily  . vitamin E  800 Units Oral BID   Continuous Infusions: PRN Meds:.hydrALAZINE, LORazepam, methocarbamol, morphine injection, ondansetron, oxyCODONE-acetaminophen, polyethylene glycol, simethicone, zolpidem  Micro Results Recent Results (from the past 240 hour(s))  Urine culture     Status: None   Collection Time: 01/07/17 11:49 PM  Result Value Ref Range Status   Specimen Description URINE, RANDOM  Final   Special Requests NONE  Final   Culture   Final    NO GROWTH Performed at [facility], [place] [address], [place], [place] [address]    Report Status 01/09/2017 FINAL  Final    Radiology Reports Dg Pelvis 1-2 Views  Result Date: 01/05/2017 CLINICAL DATA:  Patient fell with bruising of the left tibia and fibula. Bilateral hip pain. EXAM: PELVIS - 1-2 VIEW COMPARISON:  11/21/2016 CT FINDINGS: Subacute right-sided superior and inferior pubic rami fractures with subtle callus formation is noted. The superior pubic ramus fracture is more apparent than on prior CT and may have declared itself since. Both hip joints are maintained. No proximal femoral  fracture. Spinal fusion hardware noted of the lumbosacral spine from approximately L4 through S1. No pelvic diastasis. IMPRESSION: 1. Subacute fractures of the right superior and inferior pubic rami with subtle callus formation. 2. Posterior lumbar spinal fusion hardware noted from L4 through S1. Electronically Signed   By: Ashley Royalty M.D.   On: 01/05/2017 01:06   Dg Tibia/fibula Left  Result Date: 01/05/2017 CLINICAL DATA:  Left leg pain. Patient fell with bruising of the distal left tibia and fibula. EXAM: LEFT TIBIA AND FIBULA - 2 VIEW COMPARISON:  None. 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 No fracture of the tibia nor fibula is identified. There is mild generalized soft tissue induration and swelling of the leg. IMPRESSION: Negative for acute fracture dislocation of the left tibia fibula. Mild soft tissue swelling. Electronically Signed   By: Ashley Royalty M.D.   On: 01/05/2017 01:10   Ct Head Wo Contrast  Result  Date: 01/05/2017 CLINICAL DATA:  Status post fall face forward, landing on nose. Concern for head or cervical spine injury. Initial encounter. EXAM: CT HEAD WITHOUT CONTRAST CT MAXILLOFACIAL WITHOUT CONTRAST CT CERVICAL SPINE WITHOUT CONTRAST TECHNIQUE: Multidetector CT imaging of the head, cervical spine, and maxillofacial structures were performed using the standard protocol without intravenous contrast. Multiplanar CT image reconstructions of the cervical spine and maxillofacial structures were also generated. COMPARISON:  CT of the head performed 11/21/2016, and CT of the cervical spine performed 11/01/2016. MRI of the brain performed 02/26/2015 FINDINGS: CT HEAD FINDINGS Brain: No evidence of acute infarction, hemorrhage, hydrocephalus, extra-axial collection or mass lesion/mass effect. Prominence of the ventricles and sulci reflects moderate cortical volume loss. Cerebellar atrophy is noted. Scattered periventricular and subcortical  white matter change likely reflects small vessel ischemic microangiopathy. The brainstem and fourth ventricle are within normal limits. The basal ganglia are unremarkable in appearance. The cerebral hemispheres demonstrate grossly normal gray-white differentiation. No mass effect or midline shift is seen. Vascular: No hyperdense vessel or unexpected calcification. Skull: There is no evidence of fracture; visualized osseous structures are unremarkable in appearance. Other: No significant soft tissue abnormalities are seen. CT MAXILLOFACIAL FINDINGS Osseous: There appears to be a minimally displaced fracture of the left side of the nasal bone. The maxilla and mandible appear intact. Orbits: The orbits are intact bilaterally. Sinuses: The visualized paranasal sinuses and mastoid air cells are well-aerated. Soft tissues: No significant soft tissue abnormalities are seen. The parapharyngeal fat planes are preserved. The nasopharynx, oropharynx and hypopharynx are unremarkable in appearance. The visualized portions of the valleculae and piriform sinuses are grossly unremarkable. The parotid and submandibular glands are within normal limits. No cervical lymphadenopathy is seen. CT CERVICAL SPINE FINDINGS Alignment: Normal. Skull base and vertebrae: No acute fracture. No primary bone lesion or focal pathologic process. Soft tissues and spinal canal: No prevertebral fluid or swelling. No visible canal hematoma. Disc levels: Intervertebral disc space narrowing is noted at C5-C6. Degenerative change is noted about the dens. Upper chest: The thyroid gland is diminutive but grossly unremarkable. The visualized lung bases are clear. Other: No additional soft tissue abnormalities are seen. IMPRESSION: 1. No evidence of traumatic intracranial injury. 2. Minimally displaced fracture of the left side of the nasal bone. 3. No evidence of fracture or subluxation along the cervical spine. 4. Moderate cortical volume loss and scattered  small vessel ischemic microangiopathy. 5. Minimal degenerative change along the cervical spine. Electronically Signed   By: Garald Balding M.D.   On: 01/05/2017 00:57   Ct Cervical Spine Wo Contrast  Result Date: 01/05/2017 CLINICAL DATA:  Status post fall face forward, landing on nose. Concern for head or cervical spine injury. Initial encounter. EXAM: CT HEAD WITHOUT CONTRAST CT MAXILLOFACIAL WITHOUT CONTRAST CT CERVICAL SPINE WITHOUT CONTRAST TECHNIQUE: Multidetector CT imaging of the head, cervical spine, and maxillofacial structures were performed using the standard protocol without intravenous contrast. Multiplanar CT image reconstructions of the cervical spine and maxillofacial structures were also generated. COMPARISON:  CT of the head performed 11/21/2016, and CT of the cervical spine performed 11/01/2016. MRI of the brain performed 02/26/2015 FINDINGS: CT HEAD FINDINGS Brain: No evidence of acute infarction, hemorrhage, hydrocephalus, extra-axial collection or mass lesion/mass effect. Prominence of the ventricles and sulci reflects moderate cortical volume loss. Cerebellar atrophy is noted. Scattered periventricular and subcortical white matter change likely reflects small vessel ischemic microangiopathy. The brainstem and fourth ventricle are within normal limits. The basal ganglia are unremarkable in appearance. The  cerebral hemispheres demonstrate grossly normal gray-white differentiation. No mass effect or midline shift is seen. Vascular: No hyperdense vessel or unexpected calcification. Skull: There is no evidence of fracture; visualized osseous structures are unremarkable in appearance. Other: No significant soft tissue abnormalities are seen. CT MAXILLOFACIAL FINDINGS Osseous: There appears to be a minimally displaced fracture of the left side of the nasal bone. The maxilla and mandible appear intact. Orbits: The orbits are intact bilaterally. Sinuses: The visualized paranasal sinuses and mastoid  air cells are well-aerated. Soft tissues: No significant soft tissue abnormalities are seen. The parapharyngeal fat planes are preserved. The nasopharynx, oropharynx and hypopharynx are unremarkable in appearance. The visualized portions of the valleculae and piriform sinuses are grossly unremarkable. The parotid and submandibular glands are within normal limits. No cervical lymphadenopathy is seen. CT CERVICAL SPINE FINDINGS Alignment: Normal. Skull base and vertebrae: No acute fracture. No primary bone lesion or focal pathologic process. Soft tissues and spinal canal: No prevertebral fluid or swelling. No visible canal hematoma. Disc levels: Intervertebral disc space narrowing is noted at C5-C6. Degenerative change is noted about the dens. Upper chest: The thyroid gland is diminutive but grossly unremarkable. The visualized lung bases are clear. Other: No additional soft tissue abnormalities are seen. IMPRESSION: 1. No evidence of traumatic intracranial injury. 2. Minimally displaced fracture of the left side of the nasal bone. 3. No evidence of fracture or subluxation along the cervical spine. 4. Moderate cortical volume loss and scattered small vessel ischemic microangiopathy. 5. Minimal degenerative change along the cervical spine. Electronically Signed   By: Garald Balding M.D.   On: 01/05/2017 00:57   Ct Lumbar Spine Wo Contrast  Result Date: 01/07/2017 CLINICAL DATA:  76 y/o F; multiple falls with severe pelvic pain radiating to the lower extremities. EXAM: CT LUMBAR SPINE WITHOUT CONTRAST TECHNIQUE: Multidetector CT imaging of the lumbar spine was performed without intravenous contrast administration. Multiplanar CT image reconstructions were also generated. COMPARISON:  11/21/2016 lumbar spine radiographs. 03/09/2015 CT abdomen and pelvis. FINDINGS: Segmentation: 5 lumbar type vertebrae. Alignment: Stable grade 1 L4-5 anterolisthesis. Vertebrae: Acute minimally displaced fracture of the left sacral ala  (series 3, image 95). Bones are diffusely demineralized. Posterior instrumented fusion at L4 through S1 and laminectomy with intact hardware. No appreciable periprosthetic lucency or fracture. Stable central superior endplate chronic fracture of L3 vertebral body without significant loss of height. Stable mild loss of height of the posterior aspect of L5 vertebral body. No new acute lumbar spine fracture or loss of vertebral body height identified. Paraspinal and other soft tissues: Mild calcific atherosclerosis of the abdominal aorta. Disc levels: Streak artifact from posterior instrumentation and interbody fusion hardware partially obscures the spinal canal and neural foramen from L4 through S1. Lumbar degenerative changes are greatest at L3-4 there appears to be a disc bulge with facet and ligamentum flavum hypertrophy resulting in at least moderate canal stenosis, mild left, and moderate to severe right foraminal narrowing. IMPRESSION: 1. Acute minimally displaced fracture of the left sacral ala. 2. Stable grade 1 L4-5 anterolisthesis, L3 central superior endplate deformity without loss of vertebral body height, and mild posterior loss of height of L5 vertebral body. 3. L4-S1 posterior instrumented fusion without apparent hardware related complication. 4. Lumbar degenerative changes greatest at the L3-4 level where there is multifactorial at least moderate canal stenosis with mild left/ moderate to severe right foraminal stenosis. Electronically Signed   By: Kristine Garbe M.D.   On: 01/07/2017 22:33   Ct Maxillofacial Wo Contrast  Result Date: 01/05/2017 CLINICAL DATA:  Status post fall face forward, landing on nose. Concern for head or cervical spine injury. Initial encounter. EXAM: CT HEAD WITHOUT CONTRAST CT MAXILLOFACIAL WITHOUT CONTRAST CT CERVICAL SPINE WITHOUT CONTRAST TECHNIQUE: Multidetector CT imaging of the head, cervical spine, and maxillofacial structures were performed using the  standard protocol without intravenous contrast. Multiplanar CT image reconstructions of the cervical spine and maxillofacial structures were also generated. COMPARISON:  CT of the head performed 11/21/2016, and CT of the cervical spine performed 11/01/2016. MRI of the brain performed 02/26/2015 FINDINGS: CT HEAD FINDINGS Brain: No evidence of acute infarction, hemorrhage, hydrocephalus, extra-axial collection or mass lesion/mass effect. Prominence of the ventricles and sulci reflects moderate cortical volume loss. Cerebellar atrophy is noted. Scattered periventricular and subcortical white matter change likely reflects small vessel ischemic microangiopathy. The brainstem and fourth ventricle are within normal limits. The basal ganglia are unremarkable in appearance. The cerebral hemispheres demonstrate grossly normal gray-white differentiation. No mass effect or midline shift is seen. Vascular: No hyperdense vessel or unexpected calcification. Skull: There is no evidence of fracture; visualized osseous structures are unremarkable in appearance. Other: No significant soft tissue abnormalities are seen. CT MAXILLOFACIAL FINDINGS Osseous: There appears to be a minimally displaced fracture of the left side of the nasal bone. The maxilla and mandible appear intact. Orbits: The orbits are intact bilaterally. Sinuses: The visualized paranasal sinuses and mastoid air cells are well-aerated. Soft tissues: No significant soft tissue abnormalities are seen. The parapharyngeal fat planes are preserved. The nasopharynx, oropharynx and hypopharynx are unremarkable in appearance. The visualized portions of the valleculae and piriform sinuses are grossly unremarkable. The parotid and submandibular glands are within normal limits. No cervical lymphadenopathy is seen. CT CERVICAL SPINE FINDINGS Alignment: Normal. Skull base and vertebrae: No acute fracture. No primary bone lesion or focal pathologic process. Soft tissues and spinal  canal: No prevertebral fluid or swelling. No visible canal hematoma. Disc levels: Intervertebral disc space narrowing is noted at C5-C6. Degenerative change is noted about the dens. Upper chest: The thyroid gland is diminutive but grossly unremarkable. The visualized lung bases are clear. Other: No additional soft tissue abnormalities are seen. IMPRESSION: 1. No evidence of traumatic intracranial injury. 2. Minimally displaced fracture of the left side of the nasal bone. 3. No evidence of fracture or subluxation along the cervical spine. 4. Moderate cortical volume loss and scattered small vessel ischemic microangiopathy. 5. Minimal degenerative change along the cervical spine. Electronically Signed   By: Garald Balding M.D.   On: 01/05/2017 00:57    Time Spent in minutes  no charge   Reyne Dumas M.D on 01/10/2017 at 12:34 PM  Between 7am to 7pm - Pager - [phone]  After 7pm go to [email or web address] - password [email or web address]  [facility] -  Office  [phone]

## 2017-01-11 DIAGNOSIS — R102 Pelvic and perineal pain: Secondary | ICD-10-CM | POA: Diagnosis not present

## 2017-01-11 DIAGNOSIS — S5290XA Unspecified fracture of unspecified forearm, initial encounter for closed fracture: Secondary | ICD-10-CM | POA: Diagnosis not present

## 2017-01-11 DIAGNOSIS — R4182 Altered mental status, unspecified: Secondary | ICD-10-CM | POA: Diagnosis not present

## 2017-01-11 DIAGNOSIS — S32591D Other specified fracture of right pubis, subsequent encounter for fracture with routine healing: Secondary | ICD-10-CM | POA: Diagnosis not present

## 2017-01-11 DIAGNOSIS — R0602 Shortness of breath: Secondary | ICD-10-CM | POA: Diagnosis not present

## 2017-01-11 DIAGNOSIS — N390 Urinary tract infection, site not specified: Secondary | ICD-10-CM | POA: Diagnosis not present

## 2017-01-11 DIAGNOSIS — R52 Pain, unspecified: Secondary | ICD-10-CM | POA: Diagnosis not present

## 2017-01-11 DIAGNOSIS — S3219XS Other fracture of sacrum, sequela: Secondary | ICD-10-CM | POA: Diagnosis not present

## 2017-01-11 DIAGNOSIS — M4807 Spinal stenosis, lumbosacral region: Secondary | ICD-10-CM | POA: Diagnosis not present

## 2017-01-11 DIAGNOSIS — M545 Low back pain: Secondary | ICD-10-CM | POA: Diagnosis not present

## 2017-01-11 DIAGNOSIS — M79605 Pain in left leg: Secondary | ICD-10-CM | POA: Diagnosis not present

## 2017-01-11 DIAGNOSIS — S3210XA Unspecified fracture of sacrum, initial encounter for closed fracture: Secondary | ICD-10-CM

## 2017-01-11 DIAGNOSIS — D696 Thrombocytopenia, unspecified: Secondary | ICD-10-CM

## 2017-01-11 DIAGNOSIS — S3210XD Unspecified fracture of sacrum, subsequent encounter for fracture with routine healing: Secondary | ICD-10-CM | POA: Diagnosis not present

## 2017-01-11 DIAGNOSIS — S32131A Minimally displaced Zone III fracture of sacrum, initial encounter for closed fracture: Secondary | ICD-10-CM | POA: Diagnosis not present

## 2017-01-11 DIAGNOSIS — M25531 Pain in right wrist: Secondary | ICD-10-CM | POA: Diagnosis not present

## 2017-01-11 DIAGNOSIS — S52571E Other intraarticular fracture of lower end of right radius, subsequent encounter for open fracture type I or II with routine healing: Secondary | ICD-10-CM | POA: Diagnosis not present

## 2017-01-11 DIAGNOSIS — E785 Hyperlipidemia, unspecified: Secondary | ICD-10-CM

## 2017-01-11 DIAGNOSIS — I10 Essential (primary) hypertension: Secondary | ICD-10-CM | POA: Diagnosis not present

## 2017-01-11 DIAGNOSIS — R2681 Unsteadiness on feet: Secondary | ICD-10-CM | POA: Diagnosis not present

## 2017-01-11 DIAGNOSIS — R488 Other symbolic dysfunctions: Secondary | ICD-10-CM | POA: Diagnosis not present

## 2017-01-11 DIAGNOSIS — G8929 Other chronic pain: Secondary | ICD-10-CM | POA: Diagnosis not present

## 2017-01-11 DIAGNOSIS — R262 Difficulty in walking, not elsewhere classified: Secondary | ICD-10-CM | POA: Diagnosis not present

## 2017-01-11 DIAGNOSIS — Z4789 Encounter for other orthopedic aftercare: Secondary | ICD-10-CM | POA: Diagnosis not present

## 2017-01-11 DIAGNOSIS — R5381 Other malaise: Secondary | ICD-10-CM | POA: Diagnosis not present

## 2017-01-11 DIAGNOSIS — C259 Malignant neoplasm of pancreas, unspecified: Secondary | ICD-10-CM | POA: Diagnosis not present

## 2017-01-11 DIAGNOSIS — G609 Hereditary and idiopathic neuropathy, unspecified: Secondary | ICD-10-CM | POA: Diagnosis not present

## 2017-01-11 DIAGNOSIS — W19XXXA Unspecified fall, initial encounter: Secondary | ICD-10-CM

## 2017-01-11 DIAGNOSIS — E039 Hypothyroidism, unspecified: Secondary | ICD-10-CM

## 2017-01-11 DIAGNOSIS — K5900 Constipation, unspecified: Secondary | ICD-10-CM | POA: Diagnosis not present

## 2017-01-11 DIAGNOSIS — S32591A Other specified fracture of right pubis, initial encounter for closed fracture: Secondary | ICD-10-CM | POA: Diagnosis not present

## 2017-01-11 DIAGNOSIS — R609 Edema, unspecified: Secondary | ICD-10-CM | POA: Diagnosis not present

## 2017-01-11 DIAGNOSIS — M6281 Muscle weakness (generalized): Secondary | ICD-10-CM | POA: Diagnosis not present

## 2017-01-11 DIAGNOSIS — S59919D Unspecified injury of unspecified forearm, subsequent encounter: Secondary | ICD-10-CM | POA: Diagnosis not present

## 2017-01-11 DIAGNOSIS — C250 Malignant neoplasm of head of pancreas: Secondary | ICD-10-CM | POA: Diagnosis not present

## 2017-01-11 DIAGNOSIS — S3993XA Unspecified injury of pelvis, initial encounter: Secondary | ICD-10-CM | POA: Diagnosis not present

## 2017-01-11 LAB — COMPREHENSIVE METABOLIC PANEL
ALBUMIN: 3.3 g/dL — AB (ref 3.5–5.0)
ALK PHOS: 166 U/L — AB (ref 38–126)
ALT: 19 U/L (ref 14–54)
AST: 25 U/L (ref 15–41)
Anion gap: 8 (ref 5–15)
BUN: 19 mg/dL (ref 6–20)
CHLORIDE: 103 mmol/L (ref 101–111)
CO2: 28 mmol/L (ref 22–32)
CREATININE: 0.62 mg/dL (ref 0.44–1.00)
Calcium: 8.5 mg/dL — ABNORMAL LOW (ref 8.9–10.3)
GFR calc non Af Amer: >60 mL/min (ref >60)
GLUCOSE: 108 mg/dL — AB (ref 65–99)
Potassium: 3.7 mmol/L (ref 3.5–5.1)
SODIUM: 139 mmol/L (ref 135–145)
Total Bilirubin: 1 mg/dL (ref 0.3–1.2)
Total Protein: 5.8 g/dL — ABNORMAL LOW (ref 6.5–8.1)

## 2017-01-11 LAB — CBC
HCT: 33.8 % — ABNORMAL LOW (ref 36.0–46.0)
HEMOGLOBIN: 11.6 g/dL — AB (ref 12.0–15.0)
MCH: 32 pg (ref 26.0–34.0)
MCHC: 34.3 g/dL (ref 30.0–36.0)
MCV: 93.4 fL (ref 78.0–100.0)
PLATELETS: 121 10*3/uL — AB (ref 150–400)
RBC: 3.62 MIL/uL — AB (ref 3.87–5.11)
RDW: 14.7 % (ref 11.5–15.5)
WBC: 6 10*3/uL (ref 4.0–10.5)

## 2017-01-11 MED ORDER — BACLOFEN 10 MG PO TABS: 10.0000 mg | ORAL_TABLET | Freq: Three times a day (TID) | ORAL | 0 refills | Status: DC | PRN

## 2017-01-11 MED ORDER — OXYCODONE HCL 5 MG PO TABS: 5.0000 mg | ORAL_TABLET | ORAL | 0 refills | Status: DC | PRN

## 2017-01-11 MED ORDER — LORAZEPAM 0.5 MG PO TABS: 0.5000 mg | ORAL_TABLET | Freq: Three times a day (TID) | ORAL | 0 refills | Status: DC

## 2017-01-11 MED ORDER — POLYETHYLENE GLYCOL 3350 17 G PO PACK: 17.0000 g | PACK | Freq: Every day | ORAL | 0 refills | Status: DC | PRN

## 2017-01-11 NOTE — Clinical Social Work Placement (Signed)
Patient received and accepted bed offer at [facility]. [facility] contacted, CSW left voicemail for patient's daughter in law. Patient's RN can call report to [phone], room 702P on [facility].Packet complete, patient's RN notified.   CLINICAL SOCIAL WORK PLACEMENT  NOTE  Date:  01/11/2017  Patient Details  Name: [name] MRN: [id number] Date of Birth: 12/10/1940  Clinical Social Work is seeking post-discharge placement for this patient at the [place] level of care (*CSW will initial, date and re-position this form in  chart as items are completed):  Yes   Patient/family provided with [place] Work Department's list of facilities offering this level of care within the geographic area requested by the patient (or if unable, by the patient's family).  Yes   Patient/family informed of their freedom to choose among providers that offer the needed level of care, that participate in Medicare, Medicaid or managed care program needed by the patient, have an available bed and are willing to accept the patient.  Yes   Patient/family informed of [place]'s ownership interest in [facility] and [facility], as well as of the fact that they are under no obligation to receive care at these facilities.  PASRR submitted to EDS on       PASRR number received on       Existing PASRR number confirmed on 01/09/17     FL2 transmitted to all facilities in geographic area requested by pt/family on 01/09/17     FL2 transmitted to all facilities within larger geographic area on       Patient informed that his/her managed care company has contracts with or will negotiate with certain facilities, including the following:        Yes   Patient/family informed of bed offers received.  Patient chooses bed at [facility]     Physician recommends and patient chooses bed at      Patient to be transferred to [facility] on 01/11/17.  Patient to be  transferred to facility by [facility]     Patient family notified on 01/11/17 of transfer.  Name of family member notified:  Adely Facer [phone]     PHYSICIAN       Additional Comment:    _______________________________________________ Burnis Medin, LCSW 01/11/2017, 1:46 PM

## 2017-01-11 NOTE — Progress Notes (Signed)
Physical Therapy Treatment Patient Details Name: [name] MRN: [id number] DOB: 04-09-1941 Today's Date: 01/11/2017    History of Present Illness 76 y.o. 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 Pt able to sit at EOB and transfer into recliner today. Pt requires mod A for bed mobility and transfer but reports less pain today.   Follow Up Recommendations  SNF;Supervision/Assistance - 24 hour     Equipment Recommendations  None recommended by PT    Recommendations for Other Services       Precautions / Restrictions Precautions Precautions: Fall Restrictions Weight Bearing Restrictions: Yes RUE Weight Bearing: Non weight bearing (wrist) Other Position/Activity Restrictions: WBAT LE    Mobility  Bed Mobility Overal bed mobility: Needs Assistance Bed Mobility: Supine to Sit     Supine to sit: +2 for physical assistance;Mod assist     General bed mobility comments: Mod A to help raise and steady trunk, Pt able to tolerate sitting at EOB today, pt able to scoot and align hips to sit upright at EOB   Transfers Overall transfer level: Needs assistance Equipment used: 2 person hand held assist Transfers: Sit to/from [facility] Sit to Stand: Mod assist;+2 safety/equipment Stand pivot transfers: Mod assist;+2 safety/equipment       General transfer comment: Pt able to stand with mod assist and take several steps to turn and sit in recliner, +2 provided for safety as pt reports increase in pain (avoided WBing through R wrist)  Ambulation/Gait             General Gait Details: Ambulation not  attempted at this time    Stairs            Wheelchair Mobility    Modified Rankin (Stroke Patients Only)       Balance Overall balance assessment: History of Falls                                          Cognition Arousal/Alertness: Awake/alert Behavior During Therapy: WFL for tasks assessed/performed Overall Cognitive Status: No family/caregiver present to determine baseline cognitive functioning                                 General Comments: hx of memory loss      Exercises      General Comments        Pertinent Vitals/Pain Pain Assessment: Faces Faces Pain Scale: Hurts whole lot Pain Location: L pelvis Pain Descriptors / Indicators: Grimacing;Sore;Moaning Pain Intervention(s): Limited activity within patient's tolerance;Monitored during session;Repositioned    Home Living                      Prior Function            PT Goals (current goals can now be found in the care plan section) Progress towards PT goals: Progressing toward goals    Frequency    Min 3X/week      PT Plan Current plan remains appropriate    Co-evaluation  AM-PAC PT "6 Clicks" Daily Activity  Outcome Measure  Difficulty turning over in bed (including adjusting bedclothes, sheets and blankets)?: Total Difficulty moving from lying on back to sitting on the side of the bed? : Total Difficulty sitting down on and standing up from a chair with arms (e.g., wheelchair, bedside commode, etc,.)?: Total Help needed moving to and from a bed to chair (including a wheelchair)?: A Lot Help needed walking in hospital room?: A Lot Help needed climbing 3-5 steps with a railing? : Total 6 Click Score: 8    End of Session Equipment Utilized During Treatment: Gait belt Activity Tolerance: Patient limited by pain Patient left: in chair;with chair alarm set;with call bell/phone within reach   PT Visit Diagnosis: Repeated  falls (R29.6);Difficulty in walking, not elsewhere classified (R26.2)     Time: 5947-0761 PT Time Calculation (min) (ACUTE ONLY): 13 min  Charges:  $Therapeutic Activity: 8-22 mins                    G Codes:       Olegario Shearer, SPT   Reino Bellis 01/11/2017, 1:37 PM

## 2017-01-11 NOTE — Care Management Important Message (Signed)
Important Message  Patient Details  Name: ANTIONETTE LUSTER MRN: [id number] Date of Birth: [birth year]   Medicare Important Message Given:  Yes    Kerin Salen 01/11/2017, 11:22 AMImportant Message  Patient Details  Name: SWAY GUTTIERREZ MRN: [id number] Date of Birth: 20-Feb-1941   Medicare Important Message Given:  Yes    Kerin Salen 01/11/2017, 11:22 AM

## 2017-01-11 NOTE — Discharge Summary (Signed)
Physician Discharge Summary  [name] QIW:[id number] DOB: February 12, 1941 DOA: 01/07/2017  PCP: Briscoe Deutscher, DO  Admit date: 01/07/2017 Discharge date: 01/11/2017  Admitted From: Home Disposition: SNF   Recommendations for Outpatient Follow-up:  1. Follow up with orthopedics for right forearm fracture 2. Continue PT/OT and pain relief for sacral ala fracture  Home Health: N/A Equipment/Devices: N/A Discharge Condition: Stable CODE STATUS: Full Diet recommendation: Heart healthy  Brief/Interim Summary: [name] is a 76 y.o. female with a history of frequent falls with multiple bone fractures, tardive dyskinesia, MVP, HTN, TIA/CVA, hypothyroidism, pancreatic CA (s/p Whipple procedure) and depression/anxiety who presented from home with pelvic pain, found to have a sacral ala fracture. Nonoperative management was recommended. Physical therapy recommended SNF at discharge.   Discharge Diagnoses:  Principal Problem:   Fall Active Problems:   Hypothyroidism   Essential hypertension   GERD   Osteoporosis   HLD (hyperlipidemia)   Closed fracture of ramus of right pubis [facility])   Aftercare for healing traumatic fracture of radius, right, closed   Sacral fracture, closed (HCC)   Back pain   Thrombocytopenia (HCC)  Frequent fall and multiple bonefractures:Patient has chronic frequent falls in the past 2 years. Etiology is not clear, possibly due to tardive dyskinesia (due to reglan) vs. debility. She has right pubic ramus fracture, right forearm fracture with splint, nasal bone fracture. Now found to have fx of left sacral ala. - Seen by Dr. Lorin Mercy in the past, discussed with his partner Dr. Ninfa Linden, this is nonoperative fracture, recommendation is for PT and weightbearing as tolerated. - Pt is followed by ortho, Dr. Amedeo Plenty for her right forearm fracture. - DC to SNF for ongoing rehabilitation - Calcium, vitamin D - Oxycodone and baclofen prescribed prn  Hypothyroidism:  - Last  TSH was 9.812 on 11/22/16 and her dose has been increased then, repeat TSH has improved to 5.47, will continue with current dose of Synthroid without any changes giving it is in the right direction, recommend repeating TSH in 4 weeks, and if it is still elevated then, then I would increase her Synthroid.  Essential hypertension:  - Continue atenolol. Had lasix on PTA medication list, though not sure of indication for this. If becomes hypervolemic (euvolemic now, had negative stress test in 2016 with normal EF), would restart lasix 20mg  po daily.  GERD: - Protonix  HLD: - Pravastatin  Thrombocytopenia: Chronic, stable in 120's - Monitor CBC in 1 week.  Depression and anxiety:Stable, no suicidal or homicidal ideations. - Continue home medications: Buspar, Cymbalta, Requip, when necessary Ativan  Hypokalemia: Resolved with replacement.  Discharge Instructions  Allergies as of 01/11/2017      Reactions   Other Other (See Comments)   According to patient Trilafor and Reglan cause Tardive Dyskinesia   Metoclopramide Hcl Other (See Comments)   Shaking; caused tardive dyskinesia   Niacin Other (See Comments)   shaking   Trovan [alatrofloxacin Mesylate] Other (See Comments)   Caused shaking and nervousness   Benzocaine-resorcinol Rash   Celecoxib Other (See Comments)   unknown   Erythromycin Base Rash   Glucosamine Other (See Comments)   unknown   Nortriptyline Other (See Comments)   Dizziness    Phenazopyridine Hcl Other (See Comments)   unknown   Sulfa Antibiotics Nausea And Vomiting, Rash   Sulfonamide Derivatives Nausea And Vomiting, Rash      Medication List    STOP taking these medications   ciprofloxacin 500 MG tablet Commonly known as:  CIPRO   docusate sodium 100 MG capsule Commonly known as:  COLACE   furosemide 20 MG tablet Commonly known as:  LASIX   naproxen 500 MG tablet Commonly known as:  NAPROSYN   potassium chloride 10 MEQ tablet Commonly  known as:  KLOR-CON 10     TAKE these medications   aspirin 81 MG tablet Take 81 mg by mouth daily.   atenolol 25 MG tablet Commonly known as:  TENORMIN TAKE 1 TABLET BY MOUTH  EVERY MORNING   baclofen 10 MG tablet Commonly known as:  LIORESAL Take 1 tablet (10 mg total) by mouth 3 (three) times daily as needed for muscle spasms.   busPIRone 15 MG tablet Commonly known as:  BUSPAR Take 15 mg by mouth at bedtime.   CALCIUM 600 PO Take 1 tablet by mouth daily.   DULoxetine 30 MG capsule Commonly known as:  CYMBALTA Take 1 capsule (30 mg total) by mouth 2 (two) times daily.   FLUoxetine 20 MG capsule Commonly known as:  PROZAC Take 20 mg by mouth daily.   fluticasone 50 MCG/ACT nasal spray Commonly known as:  FLONASE Place 2 sprays into both nostrils daily.   latanoprost 0.005 % ophthalmic solution Commonly known as:  XALATAN Place 1 drop into both eyes nightly.   levothyroxine 200 MCG tablet Commonly known as:  SYNTHROID Take 1 tablet (200 mcg total) by mouth daily before breakfast.   lipase/protease/amylase 12000 units Cpep capsule Commonly known as:  CREON Take 2 capsules (24,000 Units total) by mouth 3 (three) times daily before meals.   LORazepam 0.5 MG tablet Commonly known as:  ATIVAN Take 1 tablet (0.5 mg total) by mouth every 8 (eight) hours.   lovastatin 40 MG tablet Commonly known as:  MEVACOR TAKE 1 TABLET BY MOUTH AT  BEDTIME   multivitamin with minerals Tabs tablet Take 1 tablet by mouth daily.   omeprazole 20 MG tablet Commonly known as:  PRILOSEC OTC Take 20 mg by mouth 2 (two) times daily.   oxyCODONE 5 MG immediate release tablet Commonly known as:  Oxy IR/ROXICODONE Take 1 tablet (5 mg total) by mouth every 4 (four) hours as needed for severe pain.   polycarbophil 625 MG tablet Commonly known as:  FIBERCON Take 1,250 mg by mouth daily.   polyethylene glycol packet Commonly known as:  MIRALAX / GLYCOLAX Take 17 g by mouth daily as  needed for mild constipation.   pregabalin 300 MG capsule Commonly known as:  LYRICA Take 300 mg by mouth 2 (two) times daily. What changed:  Another medication with the same name was changed. Make sure you understand how and when to take each.   pregabalin 100 MG capsule Commonly known as:  LYRICA Take 1 capsule (100 mg total) by mouth 2 (two) times daily. What changed:  when to take this  additional instructions   PROBIOTIC-10 PO Take 1 tablet by mouth 2 (two) times daily.   psyllium 0.52 g capsule Commonly known as:  REGULOID Take 0.52 g by mouth daily.   REFRESH TEARS 0.5 % Soln Generic drug:  carboxymethylcellulose Place 1 drop into both eyes 2 (two) times daily.   rOPINIRole 0.5 MG tablet Commonly known as:  REQUIP Take 1 tablet (0.5 mg total) by mouth at bedtime.   simethicone 125 MG chewable tablet Commonly known as:  MYLICON Chew 144 mg by mouth every 6 (six) hours as needed for flatulence.   vitamin B-12 1000 MCG tablet Commonly known as:  CYANOCOBALAMIN Take 2,000 mcg by mouth daily.   Vitamin D (Ergocalciferol) 50000 units Caps capsule Commonly known as:  DRISDOL Take 1 capsule (50,000 Units total) by mouth every 7 (seven) days. What changed:  additional instructions   vitamin E 400 UNIT capsule Take 800 Units by mouth 2 (two) times daily. For tartive dyskinesia       Contact information for follow-up providers    [name], MD Follow up.   Specialty:  Orthopedic Surgery Contact information: [place] [address] [phone]            Contact information for after-discharge care    Destination    [facility] Follow up.   Specialty:  Skilled Nursing Facility Contact information: [place] [address] [phone]                 Allergies  Allergen Reactions  . Other Other (See Comments)    According to patient Trilafor and Reglan cause Tardive Dyskinesia  .  Metoclopramide Hcl Other (See Comments)    Shaking; caused tardive dyskinesia  . Niacin Other (See Comments)    shaking  . Trovan [Alatrofloxacin Mesylate] Other (See Comments)    Caused shaking and nervousness  . Benzocaine-Resorcinol Rash  . Celecoxib Other (See Comments)    unknown  . Erythromycin Base Rash  . Glucosamine Other (See Comments)    unknown  . Nortriptyline Other (See Comments)    Dizziness   . Phenazopyridine Hcl Other (See Comments)    unknown  . Sulfa Antibiotics Nausea And Vomiting and Rash  . Sulfonamide Derivatives Nausea And Vomiting and Rash    Consultations:  None  Procedures/Studies: Dg Pelvis 1-2 Views  Result Date: 01/05/2017 CLINICAL DATA:  Patient fell with bruising of the left tibia and fibula. Bilateral hip pain. EXAM: PELVIS - 1-2 VIEW COMPARISON:  11/21/2016 CT FINDINGS: Subacute right-sided superior and inferior pubic rami fractures with subtle callus formation is noted. The superior pubic ramus fracture is more apparent than on prior CT and may have declared itself since. Both hip joints are maintained. No proximal femoral fracture. Spinal fusion hardware noted of the lumbosacral spine from approximately L4 through S1. No pelvic diastasis. IMPRESSION: 1. Subacute fractures of the right superior and inferior pubic rami with subtle callus formation. 2. Posterior lumbar spinal fusion hardware noted from L4 through S1. Electronically Signed   By: Ashley Royalty M.D.   On: 01/05/2017 01:06   Dg Tibia/fibula Left  Result Date: 01/05/2017 CLINICAL DATA:  Left leg pain. Patient fell with bruising of the distal left tibia and fibula. EXAM: LEFT TIBIA AND FIBULA - 2 VIEW COMPARISON:  None. 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 No fracture of the tibia nor fibula is identified. There is mild generalized soft tissue induration and swelling of the leg. IMPRESSION: Negative for acute fracture dislocation of the  left tibia fibula. Mild soft tissue swelling. Electronically Signed   By: Ashley Royalty M.D.   On: 01/05/2017 01:10   Ct Head Wo Contrast  Result Date: 01/05/2017 CLINICAL DATA:  Status post fall face forward, landing on nose. Concern for head or cervical spine injury. Initial encounter. EXAM: CT HEAD WITHOUT CONTRAST CT MAXILLOFACIAL WITHOUT CONTRAST CT CERVICAL SPINE WITHOUT CONTRAST TECHNIQUE: Multidetector CT imaging of the head, cervical spine, and maxillofacial structures were performed using the standard protocol without intravenous contrast. Multiplanar CT image reconstructions of the cervical spine and  maxillofacial structures were also generated. COMPARISON:  CT of the head performed 11/21/2016, and CT of the cervical spine performed 11/01/2016. MRI of the brain performed 02/26/2015 FINDINGS: CT HEAD FINDINGS Brain: No evidence of acute infarction, hemorrhage, hydrocephalus, extra-axial collection or mass lesion/mass effect. Prominence of the ventricles and sulci reflects moderate cortical volume loss. Cerebellar atrophy is noted. Scattered periventricular and subcortical white matter change likely reflects small vessel ischemic microangiopathy. The brainstem and fourth ventricle are within normal limits. The basal ganglia are unremarkable in appearance. The cerebral hemispheres demonstrate grossly normal gray-white differentiation. No mass effect or midline shift is seen. Vascular: No hyperdense vessel or unexpected calcification. Skull: There is no evidence of fracture; visualized osseous structures are unremarkable in appearance. Other: No significant soft tissue abnormalities are seen. CT MAXILLOFACIAL FINDINGS Osseous: There appears to be a minimally displaced fracture of the left side of the nasal bone. The maxilla and mandible appear intact. Orbits: The orbits are intact bilaterally. Sinuses: The visualized paranasal sinuses and mastoid air cells are well-aerated. Soft tissues: No significant soft  tissue abnormalities are seen. The parapharyngeal fat planes are preserved. The nasopharynx, oropharynx and hypopharynx are unremarkable in appearance. The visualized portions of the valleculae and piriform sinuses are grossly unremarkable. The parotid and submandibular glands are within normal limits. No cervical lymphadenopathy is seen. CT CERVICAL SPINE FINDINGS Alignment: Normal. Skull base and vertebrae: No acute fracture. No primary bone lesion or focal pathologic process. Soft tissues and spinal canal: No prevertebral fluid or swelling. No visible canal hematoma. Disc levels: Intervertebral disc space narrowing is noted at C5-C6. Degenerative change is noted about the dens. Upper chest: The thyroid gland is diminutive but grossly unremarkable. The visualized lung bases are clear. Other: No additional soft tissue abnormalities are seen. IMPRESSION: 1. No evidence of traumatic intracranial injury. 2. Minimally displaced fracture of the left side of the nasal bone. 3. No evidence of fracture or subluxation along the cervical spine. 4. Moderate cortical volume loss and scattered small vessel ischemic microangiopathy. 5. Minimal degenerative change along the cervical spine. Electronically Signed   By: Garald Balding M.D.   On: 01/05/2017 00:57   Ct Cervical Spine Wo Contrast  Result Date: 01/05/2017 CLINICAL DATA:  Status post fall face forward, landing on nose. Concern for head or cervical spine injury. Initial encounter. EXAM: CT HEAD WITHOUT CONTRAST CT MAXILLOFACIAL WITHOUT CONTRAST CT CERVICAL SPINE WITHOUT CONTRAST TECHNIQUE: Multidetector CT imaging of the head, cervical spine, and maxillofacial structures were performed using the standard protocol without intravenous contrast. Multiplanar CT image reconstructions of the cervical spine and maxillofacial structures were also generated. COMPARISON:  CT of the head performed 11/21/2016, and CT of the cervical spine performed 11/01/2016. MRI of the brain  performed 02/26/2015 FINDINGS: CT HEAD FINDINGS Brain: No evidence of acute infarction, hemorrhage, hydrocephalus, extra-axial collection or mass lesion/mass effect. Prominence of the ventricles and sulci reflects moderate cortical volume loss. Cerebellar atrophy is noted. Scattered periventricular and subcortical white matter change likely reflects small vessel ischemic microangiopathy. The brainstem and fourth ventricle are within normal limits. The basal ganglia are unremarkable in appearance. The cerebral hemispheres demonstrate grossly normal gray-white differentiation. No mass effect or midline shift is seen. Vascular: No hyperdense vessel or unexpected calcification. Skull: There is no evidence of fracture; visualized osseous structures are unremarkable in appearance. Other: No significant soft tissue abnormalities are seen. CT MAXILLOFACIAL FINDINGS Osseous: There appears to be a minimally displaced fracture of the left side of the nasal bone. The maxilla  and mandible appear intact. Orbits: The orbits are intact bilaterally. Sinuses: The visualized paranasal sinuses and mastoid air cells are well-aerated. Soft tissues: No significant soft tissue abnormalities are seen. The parapharyngeal fat planes are preserved. The nasopharynx, oropharynx and hypopharynx are unremarkable in appearance. The visualized portions of the valleculae and piriform sinuses are grossly unremarkable. The parotid and submandibular glands are within normal limits. No cervical lymphadenopathy is seen. CT CERVICAL SPINE FINDINGS Alignment: Normal. Skull base and vertebrae: No acute fracture. No primary bone lesion or focal pathologic process. Soft tissues and spinal canal: No prevertebral fluid or swelling. No visible canal hematoma. Disc levels: Intervertebral disc space narrowing is noted at C5-C6. Degenerative change is noted about the dens. Upper chest: The thyroid gland is diminutive but grossly unremarkable. The visualized lung  bases are clear. Other: No additional soft tissue abnormalities are seen. IMPRESSION: 1. No evidence of traumatic intracranial injury. 2. Minimally displaced fracture of the left side of the nasal bone. 3. No evidence of fracture or subluxation along the cervical spine. 4. Moderate cortical volume loss and scattered small vessel ischemic microangiopathy. 5. Minimal degenerative change along the cervical spine. Electronically Signed   By: Garald Balding M.D.   On: 01/05/2017 00:57   Ct Lumbar Spine Wo Contrast  Result Date: 01/07/2017 CLINICAL DATA:  76 y/o F; multiple falls with severe pelvic pain radiating to the lower extremities. EXAM: CT LUMBAR SPINE WITHOUT CONTRAST TECHNIQUE: Multidetector CT imaging of the lumbar spine was performed without intravenous contrast administration. Multiplanar CT image reconstructions were also generated. COMPARISON:  11/21/2016 lumbar spine radiographs. 03/09/2015 CT abdomen and pelvis. FINDINGS: Segmentation: 5 lumbar type vertebrae. Alignment: Stable grade 1 L4-5 anterolisthesis. Vertebrae: Acute minimally displaced fracture of the left sacral ala (series 3, image 95). Bones are diffusely demineralized. Posterior instrumented fusion at L4 through S1 and laminectomy with intact hardware. No appreciable periprosthetic lucency or fracture. Stable central superior endplate chronic fracture of L3 vertebral body without significant loss of height. Stable mild loss of height of the posterior aspect of L5 vertebral body. No new acute lumbar spine fracture or loss of vertebral body height identified. Paraspinal and other soft tissues: Mild calcific atherosclerosis of the abdominal aorta. Disc levels: Streak artifact from posterior instrumentation and interbody fusion hardware partially obscures the spinal canal and neural foramen from L4 through S1. Lumbar degenerative changes are greatest at L3-4 there appears to be a disc bulge with facet and ligamentum flavum hypertrophy  resulting in at least moderate canal stenosis, mild left, and moderate to severe right foraminal narrowing. IMPRESSION: 1. Acute minimally displaced fracture of the left sacral ala. 2. Stable grade 1 L4-5 anterolisthesis, L3 central superior endplate deformity without loss of vertebral body height, and mild posterior loss of height of L5 vertebral body. 3. L4-S1 posterior instrumented fusion without apparent hardware related complication. 4. Lumbar degenerative changes greatest at the L3-4 level where there is multifactorial at least moderate canal stenosis with mild left/ moderate to severe right foraminal stenosis. Electronically Signed   By: Kristine Garbe M.D.   On: 01/07/2017 22:33   Ct Maxillofacial Wo Contrast  Result Date: 01/05/2017 CLINICAL DATA:  Status post fall face forward, landing on nose. Concern for head or cervical spine injury. Initial encounter. EXAM: CT HEAD WITHOUT CONTRAST CT MAXILLOFACIAL WITHOUT CONTRAST CT CERVICAL SPINE WITHOUT CONTRAST TECHNIQUE: Multidetector CT imaging of the head, cervical spine, and maxillofacial structures were performed using the standard protocol without intravenous contrast. Multiplanar CT image reconstructions of the cervical  spine and maxillofacial structures were also generated. COMPARISON:  CT of the head performed 11/21/2016, and CT of the cervical spine performed 11/01/2016. MRI of the brain performed 02/26/2015 FINDINGS: CT HEAD FINDINGS Brain: No evidence of acute infarction, hemorrhage, hydrocephalus, extra-axial collection or mass lesion/mass effect. Prominence of the ventricles and sulci reflects moderate cortical volume loss. Cerebellar atrophy is noted. Scattered periventricular and subcortical white matter change likely reflects small vessel ischemic microangiopathy. The brainstem and fourth ventricle are within normal limits. The basal ganglia are unremarkable in appearance. The cerebral hemispheres demonstrate grossly normal gray-white  differentiation. No mass effect or midline shift is seen. Vascular: No hyperdense vessel or unexpected calcification. Skull: There is no evidence of fracture; visualized osseous structures are unremarkable in appearance. Other: No significant soft tissue abnormalities are seen. CT MAXILLOFACIAL FINDINGS Osseous: There appears to be a minimally displaced fracture of the left side of the nasal bone. The maxilla and mandible appear intact. Orbits: The orbits are intact bilaterally. Sinuses: The visualized paranasal sinuses and mastoid air cells are well-aerated. Soft tissues: No significant soft tissue abnormalities are seen. The parapharyngeal fat planes are preserved. The nasopharynx, oropharynx and hypopharynx are unremarkable in appearance. The visualized portions of the valleculae and piriform sinuses are grossly unremarkable. The parotid and submandibular glands are within normal limits. No cervical lymphadenopathy is seen. CT CERVICAL SPINE FINDINGS Alignment: Normal. Skull base and vertebrae: No acute fracture. No primary bone lesion or focal pathologic process. Soft tissues and spinal canal: No prevertebral fluid or swelling. No visible canal hematoma. Disc levels: Intervertebral disc space narrowing is noted at C5-C6. Degenerative change is noted about the dens. Upper chest: The thyroid gland is diminutive but grossly unremarkable. The visualized lung bases are clear. Other: No additional soft tissue abnormalities are seen. IMPRESSION: 1. No evidence of traumatic intracranial injury. 2. Minimally displaced fracture of the left side of the nasal bone. 3. No evidence of fracture or subluxation along the cervical spine. 4. Moderate cortical volume loss and scattered small vessel ischemic microangiopathy. 5. Minimal degenerative change along the cervical spine. Electronically Signed   By: Garald Balding M.D.   On: 01/05/2017 00:57   Subjective: Pain in the bottom is stable, worse with movement, better with  medications. No weakness, numbness.   Discharge Exam: Vitals:   01/10/17 2046 01/11/17 0432  BP: (!) 142/63 (!) 152/61  Pulse: 70 74  Resp: 17 18  Temp: 98.4 F (36.9 C) 98.1 F (36.7 C)  SpO2: 94% 94%   General: Pt is alert, awake, not in acute distress Cardiovascular: RRR, S1/S2 +, no rubs, no gallops Respiratory: CTA bilaterally, no wheezing, no rhonchi Abdominal: Soft, NT, ND, bowel sounds + Extremities: RUE in rigid cast, fingers warm, cap refill brisk, sensorimotor exam normal. Sacrum tender to palpation without deformity. Bilateral LEs with 5/5 strength and no numbness.  Skin: Ecchymosis across nasal bridge.  Labs: Basic Metabolic Panel:  Recent Labs Lab 01/07/17 1730 01/11/17 0422  NA 142 139  K 3.5 3.7  CL 107 103  CO2 25 28  GLUCOSE 82 108*  BUN 19 19  CREATININE 0.64 0.62  CALCIUM 9.0 8.5*   Liver Function Tests:  Recent Labs Lab 01/07/17 1730 01/11/17 0422  AST 26 25  ALT 19 19  ALKPHOS 151* 166*  BILITOT 1.1 1.0  PROT 7.0 5.8*  ALBUMIN 4.0 3.3*   CBC:  Recent Labs Lab 01/07/17 1730 01/11/17 0422  WBC 7.3 6.0  HGB 12.4 11.6*  HCT 37.2 33.8*  MCV 93.5 93.4  PLT 126* 121*   Urinalysis    Component Value Date/Time   COLORURINE YELLOW 01/07/2017 2335   APPEARANCEUR CLEAR 01/07/2017 2335   LABSPEC 1.015 01/07/2017 2335   PHURINE 5.0 01/07/2017 2335   GLUCOSEU NEGATIVE 01/07/2017 2335   GLUCOSEU NEGATIVE 07/30/2012 1410   HGBUR NEGATIVE 01/07/2017 2335   BILIRUBINUR NEGATIVE 01/07/2017 2335   BILIRUBINUR Negative 10/26/2016 1059   KETONESUR 20 (A) 01/07/2017 2335   PROTEINUR NEGATIVE 01/07/2017 2335   UROBILINOGEN 0.2 10/26/2016 1059   UROBILINOGEN 0.2 02/15/2014 1445   NITRITE NEGATIVE 01/07/2017 2335   LEUKOCYTESUR NEGATIVE 01/07/2017 2335    Microbiology Recent Results (from the past 240 hour(s))  Urine culture     Status: None   Collection Time: 01/07/17 11:49 PM  Result Value Ref Range Status   Specimen Description  URINE, RANDOM  Final   Special Requests NONE  Final   Culture   Final    NO GROWTH Performed at [facility], [place] [address] [place], [place] [address]    Report Status 01/09/2017 FINAL  Final    Time coordinating discharge: Approximately 40 minutes  Vance Gather, MD  [facility] 01/11/2017, 12:13 PM Pager [phone]

## 2017-01-12 DIAGNOSIS — I10 Essential (primary) hypertension: Secondary | ICD-10-CM | POA: Diagnosis not present

## 2017-01-12 DIAGNOSIS — S32591A Other specified fracture of right pubis, initial encounter for closed fracture: Secondary | ICD-10-CM | POA: Diagnosis not present

## 2017-01-12 DIAGNOSIS — M4807 Spinal stenosis, lumbosacral region: Secondary | ICD-10-CM | POA: Diagnosis not present

## 2017-01-12 DIAGNOSIS — M79605 Pain in left leg: Secondary | ICD-10-CM | POA: Diagnosis not present

## 2017-01-12 DIAGNOSIS — R102 Pelvic and perineal pain: Secondary | ICD-10-CM | POA: Diagnosis not present

## 2017-01-12 DIAGNOSIS — G8929 Other chronic pain: Secondary | ICD-10-CM | POA: Diagnosis not present

## 2017-01-12 DIAGNOSIS — M6281 Muscle weakness (generalized): Secondary | ICD-10-CM | POA: Diagnosis not present

## 2017-01-12 DIAGNOSIS — R2681 Unsteadiness on feet: Secondary | ICD-10-CM | POA: Diagnosis not present

## 2017-01-12 DIAGNOSIS — M545 Low back pain: Secondary | ICD-10-CM | POA: Diagnosis not present

## 2017-01-12 DIAGNOSIS — R5381 Other malaise: Secondary | ICD-10-CM | POA: Diagnosis not present

## 2017-01-12 DIAGNOSIS — R52 Pain, unspecified: Secondary | ICD-10-CM | POA: Diagnosis not present

## 2017-01-12 DIAGNOSIS — G609 Hereditary and idiopathic neuropathy, unspecified: Secondary | ICD-10-CM | POA: Diagnosis not present

## 2017-01-13 DIAGNOSIS — R102 Pelvic and perineal pain: Secondary | ICD-10-CM | POA: Diagnosis not present

## 2017-01-13 DIAGNOSIS — M4807 Spinal stenosis, lumbosacral region: Secondary | ICD-10-CM | POA: Diagnosis not present

## 2017-01-13 DIAGNOSIS — M545 Low back pain: Secondary | ICD-10-CM | POA: Diagnosis not present

## 2017-01-13 DIAGNOSIS — M6281 Muscle weakness (generalized): Secondary | ICD-10-CM | POA: Diagnosis not present

## 2017-01-13 DIAGNOSIS — G609 Hereditary and idiopathic neuropathy, unspecified: Secondary | ICD-10-CM | POA: Diagnosis not present

## 2017-01-13 DIAGNOSIS — R4182 Altered mental status, unspecified: Secondary | ICD-10-CM | POA: Diagnosis not present

## 2017-01-13 DIAGNOSIS — M79605 Pain in left leg: Secondary | ICD-10-CM | POA: Diagnosis not present

## 2017-01-13 DIAGNOSIS — R2681 Unsteadiness on feet: Secondary | ICD-10-CM | POA: Diagnosis not present

## 2017-01-13 DIAGNOSIS — R5381 Other malaise: Secondary | ICD-10-CM | POA: Diagnosis not present

## 2017-01-13 DIAGNOSIS — G8929 Other chronic pain: Secondary | ICD-10-CM | POA: Diagnosis not present

## 2017-01-16 ENCOUNTER — Encounter (INDEPENDENT_AMBULATORY_CARE_PROVIDER_SITE_OTHER): Payer: Self-pay | Admitting: Orthopaedic Surgery

## 2017-01-16 ENCOUNTER — Ambulatory Visit: Payer: Medicare Other | Admitting: Neurology

## 2017-01-16 ENCOUNTER — Ambulatory Visit (INDEPENDENT_AMBULATORY_CARE_PROVIDER_SITE_OTHER): Payer: Medicare Other | Admitting: Orthopaedic Surgery

## 2017-01-16 VITALS — BP 131/67 | HR 78

## 2017-01-16 DIAGNOSIS — G8929 Other chronic pain: Secondary | ICD-10-CM | POA: Diagnosis not present

## 2017-01-16 DIAGNOSIS — M6281 Muscle weakness (generalized): Secondary | ICD-10-CM | POA: Diagnosis not present

## 2017-01-16 DIAGNOSIS — M4807 Spinal stenosis, lumbosacral region: Secondary | ICD-10-CM | POA: Diagnosis not present

## 2017-01-16 DIAGNOSIS — R2681 Unsteadiness on feet: Secondary | ICD-10-CM | POA: Diagnosis not present

## 2017-01-16 DIAGNOSIS — S32131A Minimally displaced Zone III fracture of sacrum, initial encounter for closed fracture: Secondary | ICD-10-CM | POA: Diagnosis not present

## 2017-01-16 DIAGNOSIS — R102 Pelvic and perineal pain: Secondary | ICD-10-CM | POA: Diagnosis not present

## 2017-01-16 DIAGNOSIS — R5381 Other malaise: Secondary | ICD-10-CM | POA: Diagnosis not present

## 2017-01-16 DIAGNOSIS — M79605 Pain in left leg: Secondary | ICD-10-CM | POA: Diagnosis not present

## 2017-01-16 DIAGNOSIS — G609 Hereditary and idiopathic neuropathy, unspecified: Secondary | ICD-10-CM | POA: Diagnosis not present

## 2017-01-16 DIAGNOSIS — M545 Low back pain: Secondary | ICD-10-CM | POA: Diagnosis not present
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
## 2017-01-17 ENCOUNTER — Ambulatory Visit (INDEPENDENT_AMBULATORY_CARE_PROVIDER_SITE_OTHER): Payer: Medicare Other | Admitting: Orthopaedic Surgery

## 2017-01-17 DIAGNOSIS — G609 Hereditary and idiopathic neuropathy, unspecified: Secondary | ICD-10-CM | POA: Diagnosis not present

## 2017-01-17 DIAGNOSIS — R102 Pelvic and perineal pain: Secondary | ICD-10-CM | POA: Diagnosis not present

## 2017-01-17 DIAGNOSIS — R5381 Other malaise: Secondary | ICD-10-CM | POA: Diagnosis not present

## 2017-01-17 DIAGNOSIS — G8929 Other chronic pain: Secondary | ICD-10-CM | POA: Diagnosis not present

## 2017-01-17 DIAGNOSIS — M6281 Muscle weakness (generalized): Secondary | ICD-10-CM | POA: Diagnosis not present

## 2017-01-17 DIAGNOSIS — M4807 Spinal stenosis, lumbosacral region: Secondary | ICD-10-CM | POA: Diagnosis not present

## 2017-01-17 DIAGNOSIS — M79605 Pain in left leg: Secondary | ICD-10-CM | POA: Diagnosis not present

## 2017-01-17 DIAGNOSIS — R2681 Unsteadiness on feet: Secondary | ICD-10-CM | POA: Diagnosis not present

## 2017-01-17 DIAGNOSIS — M545 Low back pain: Secondary | ICD-10-CM | POA: Diagnosis not present

## 2017-01-18 DIAGNOSIS — M4807 Spinal stenosis, lumbosacral region: Secondary | ICD-10-CM | POA: Diagnosis not present

## 2017-01-18 DIAGNOSIS — M79605 Pain in left leg: Secondary | ICD-10-CM | POA: Diagnosis not present

## 2017-01-18 DIAGNOSIS — G609 Hereditary and idiopathic neuropathy, unspecified: Secondary | ICD-10-CM | POA: Diagnosis not present

## 2017-01-18 DIAGNOSIS — R5381 Other malaise: Secondary | ICD-10-CM | POA: Diagnosis not present

## 2017-01-18 DIAGNOSIS — R102 Pelvic and perineal pain: Secondary | ICD-10-CM | POA: Diagnosis not present

## 2017-01-18 DIAGNOSIS — N390 Urinary tract infection, site not specified: Secondary | ICD-10-CM | POA: Diagnosis not present

## 2017-01-18 DIAGNOSIS — R2681 Unsteadiness on feet: Secondary | ICD-10-CM | POA: Diagnosis not present

## 2017-01-18 DIAGNOSIS — G8929 Other chronic pain: Secondary | ICD-10-CM | POA: Diagnosis not present

## 2017-01-18 DIAGNOSIS — M6281 Muscle weakness (generalized): Secondary | ICD-10-CM | POA: Diagnosis not present

## 2017-01-18 DIAGNOSIS — M545 Low back pain: Secondary | ICD-10-CM | POA: Diagnosis not present

## 2017-01-19 DIAGNOSIS — G609 Hereditary and idiopathic neuropathy, unspecified: Secondary | ICD-10-CM | POA: Diagnosis not present

## 2017-01-19 DIAGNOSIS — R5381 Other malaise: Secondary | ICD-10-CM | POA: Diagnosis not present

## 2017-01-19 DIAGNOSIS — M4807 Spinal stenosis, lumbosacral region: Secondary | ICD-10-CM | POA: Diagnosis not present

## 2017-01-19 DIAGNOSIS — R2681 Unsteadiness on feet: Secondary | ICD-10-CM | POA: Diagnosis not present

## 2017-01-19 DIAGNOSIS — R102 Pelvic and perineal pain: Secondary | ICD-10-CM | POA: Diagnosis not present

## 2017-01-19 DIAGNOSIS — M545 Low back pain: Secondary | ICD-10-CM | POA: Diagnosis not present

## 2017-01-19 DIAGNOSIS — M6281 Muscle weakness (generalized): Secondary | ICD-10-CM | POA: Diagnosis not present

## 2017-01-19 DIAGNOSIS — G8929 Other chronic pain: Secondary | ICD-10-CM | POA: Diagnosis not present

## 2017-01-19 DIAGNOSIS — M79605 Pain in left leg: Secondary | ICD-10-CM | POA: Diagnosis not present

## 2017-01-20 DIAGNOSIS — R5381 Other malaise: Secondary | ICD-10-CM | POA: Diagnosis not present

## 2017-01-20 DIAGNOSIS — M545 Low back pain: Secondary | ICD-10-CM | POA: Diagnosis not present

## 2017-01-20 DIAGNOSIS — M4807 Spinal stenosis, lumbosacral region: Secondary | ICD-10-CM | POA: Diagnosis not present

## 2017-01-20 DIAGNOSIS — M79605 Pain in left leg: Secondary | ICD-10-CM | POA: Diagnosis not present

## 2017-01-20 DIAGNOSIS — G609 Hereditary and idiopathic neuropathy, unspecified: Secondary | ICD-10-CM | POA: Diagnosis not present

## 2017-01-20 DIAGNOSIS — R102 Pelvic and perineal pain: Secondary | ICD-10-CM | POA: Diagnosis not present

## 2017-01-20 DIAGNOSIS — M6281 Muscle weakness (generalized): Secondary | ICD-10-CM | POA: Diagnosis not present

## 2017-01-20 DIAGNOSIS — G8929 Other chronic pain: Secondary | ICD-10-CM | POA: Diagnosis not present

## 2017-01-20 DIAGNOSIS — R2681 Unsteadiness on feet: Secondary | ICD-10-CM | POA: Diagnosis not present

## 2017-01-23 DIAGNOSIS — R2681 Unsteadiness on feet: Secondary | ICD-10-CM | POA: Diagnosis not present

## 2017-01-23 DIAGNOSIS — G8929 Other chronic pain: Secondary | ICD-10-CM | POA: Diagnosis not present

## 2017-01-23 DIAGNOSIS — R102 Pelvic and perineal pain: Secondary | ICD-10-CM | POA: Diagnosis not present

## 2017-01-23 DIAGNOSIS — M79605 Pain in left leg: Secondary | ICD-10-CM | POA: Diagnosis not present

## 2017-01-23 DIAGNOSIS — G609 Hereditary and idiopathic neuropathy, unspecified: Secondary | ICD-10-CM | POA: Diagnosis not present

## 2017-01-23 DIAGNOSIS — K5900 Constipation, unspecified: Secondary | ICD-10-CM | POA: Diagnosis not present

## 2017-01-23 DIAGNOSIS — M6281 Muscle weakness (generalized): Secondary | ICD-10-CM | POA: Diagnosis not present

## 2017-01-23 DIAGNOSIS — M545 Low back pain: Secondary | ICD-10-CM | POA: Diagnosis not present

## 2017-01-23 DIAGNOSIS — R5381 Other malaise: Secondary | ICD-10-CM | POA: Diagnosis not present

## 2017-01-23 DIAGNOSIS — M4807 Spinal stenosis, lumbosacral region: Secondary | ICD-10-CM | POA: Diagnosis not present

## 2017-01-24 DIAGNOSIS — M545 Low back pain: Secondary | ICD-10-CM | POA: Diagnosis not present

## 2017-01-24 DIAGNOSIS — R5381 Other malaise: Secondary | ICD-10-CM | POA: Diagnosis not present

## 2017-01-24 DIAGNOSIS — M4807 Spinal stenosis, lumbosacral region: Secondary | ICD-10-CM | POA: Diagnosis not present

## 2017-01-24 DIAGNOSIS — R102 Pelvic and perineal pain: Secondary | ICD-10-CM | POA: Diagnosis not present

## 2017-01-24 DIAGNOSIS — M79605 Pain in left leg: Secondary | ICD-10-CM | POA: Diagnosis not present

## 2017-01-24 DIAGNOSIS — M6281 Muscle weakness (generalized): Secondary | ICD-10-CM | POA: Diagnosis not present

## 2017-01-24 DIAGNOSIS — R2681 Unsteadiness on feet: Secondary | ICD-10-CM | POA: Diagnosis not present

## 2017-01-24 DIAGNOSIS — G609 Hereditary and idiopathic neuropathy, unspecified: Secondary | ICD-10-CM | POA: Diagnosis not present

## 2017-01-24 DIAGNOSIS — G8929 Other chronic pain: Secondary | ICD-10-CM | POA: Diagnosis not present

## 2017-01-25 DIAGNOSIS — M545 Low back pain: Secondary | ICD-10-CM | POA: Diagnosis not present

## 2017-01-25 DIAGNOSIS — G8929 Other chronic pain: Secondary | ICD-10-CM | POA: Diagnosis not present

## 2017-01-25 DIAGNOSIS — M4807 Spinal stenosis, lumbosacral region: Secondary | ICD-10-CM | POA: Diagnosis not present

## 2017-01-25 DIAGNOSIS — R2681 Unsteadiness on feet: Secondary | ICD-10-CM | POA: Diagnosis not present

## 2017-01-25 DIAGNOSIS — R102 Pelvic and perineal pain: Secondary | ICD-10-CM | POA: Diagnosis not present

## 2017-01-25 DIAGNOSIS — R5381 Other malaise: Secondary | ICD-10-CM | POA: Diagnosis not present

## 2017-01-25 DIAGNOSIS — M79605 Pain in left leg: Secondary | ICD-10-CM | POA: Diagnosis not present

## 2017-01-25 DIAGNOSIS — G609 Hereditary and idiopathic neuropathy, unspecified: Secondary | ICD-10-CM | POA: Diagnosis not present

## 2017-01-25 DIAGNOSIS — M6281 Muscle weakness (generalized): Secondary | ICD-10-CM | POA: Diagnosis not present

## 2017-01-26 DIAGNOSIS — M79605 Pain in left leg: Secondary | ICD-10-CM | POA: Diagnosis not present

## 2017-01-26 DIAGNOSIS — R2681 Unsteadiness on feet: Secondary | ICD-10-CM | POA: Diagnosis not present

## 2017-01-26 DIAGNOSIS — R102 Pelvic and perineal pain: Secondary | ICD-10-CM | POA: Diagnosis not present

## 2017-01-26 DIAGNOSIS — R5381 Other malaise: Secondary | ICD-10-CM | POA: Diagnosis not present

## 2017-01-26 DIAGNOSIS — M545 Low back pain: Secondary | ICD-10-CM | POA: Diagnosis not present

## 2017-01-26 DIAGNOSIS — G609 Hereditary and idiopathic neuropathy, unspecified: Secondary | ICD-10-CM | POA: Diagnosis not present

## 2017-01-26 DIAGNOSIS — M6281 Muscle weakness (generalized): Secondary | ICD-10-CM | POA: Diagnosis not present

## 2017-01-26 DIAGNOSIS — G8929 Other chronic pain: Secondary | ICD-10-CM | POA: Diagnosis not present

## 2017-01-26 DIAGNOSIS — M4807 Spinal stenosis, lumbosacral region: Secondary | ICD-10-CM | POA: Diagnosis not present

## 2017-01-27 DIAGNOSIS — R52 Pain, unspecified: Secondary | ICD-10-CM | POA: Diagnosis not present

## 2017-01-31 DIAGNOSIS — M6281 Muscle weakness (generalized): Secondary | ICD-10-CM | POA: Diagnosis not present

## 2017-01-31 DIAGNOSIS — M545 Low back pain: Secondary | ICD-10-CM | POA: Diagnosis not present

## 2017-01-31 DIAGNOSIS — G8929 Other chronic pain: Secondary | ICD-10-CM | POA: Diagnosis not present

## 2017-01-31 DIAGNOSIS — M25531 Pain in right wrist: Secondary | ICD-10-CM | POA: Diagnosis not present

## 2017-01-31 DIAGNOSIS — R2681 Unsteadiness on feet: Secondary | ICD-10-CM | POA: Diagnosis not present

## 2017-01-31 DIAGNOSIS — M4807 Spinal stenosis, lumbosacral region: Secondary | ICD-10-CM | POA: Diagnosis not present

## 2017-01-31 DIAGNOSIS — M79605 Pain in left leg: Secondary | ICD-10-CM | POA: Diagnosis not present

## 2017-01-31 DIAGNOSIS — R102 Pelvic and perineal pain: Secondary | ICD-10-CM | POA: Diagnosis not present

## 2017-01-31 DIAGNOSIS — S52571E Other intraarticular fracture of lower end of right radius, subsequent encounter for open fracture type I or II with routine healing: Secondary | ICD-10-CM | POA: Diagnosis not present

## 2017-01-31 DIAGNOSIS — G609 Hereditary and idiopathic neuropathy, unspecified: Secondary | ICD-10-CM | POA: Diagnosis not present

## 2017-01-31 DIAGNOSIS — R5381 Other malaise: Secondary | ICD-10-CM | POA: Diagnosis not present

## 2017-02-02 DIAGNOSIS — M545 Low back pain: Secondary | ICD-10-CM | POA: Diagnosis not present

## 2017-02-02 DIAGNOSIS — M6281 Muscle weakness (generalized): Secondary | ICD-10-CM | POA: Diagnosis not present

## 2017-02-02 DIAGNOSIS — M79605 Pain in left leg: Secondary | ICD-10-CM | POA: Diagnosis not present

## 2017-02-02 DIAGNOSIS — R102 Pelvic and perineal pain: Secondary | ICD-10-CM | POA: Diagnosis not present

## 2017-02-02 DIAGNOSIS — R5381 Other malaise: Secondary | ICD-10-CM | POA: Diagnosis not present

## 2017-02-02 DIAGNOSIS — M4807 Spinal stenosis, lumbosacral region: Secondary | ICD-10-CM | POA: Diagnosis not present

## 2017-02-02 DIAGNOSIS — G8929 Other chronic pain: Secondary | ICD-10-CM | POA: Diagnosis not present

## 2017-02-02 DIAGNOSIS — R2681 Unsteadiness on feet: Secondary | ICD-10-CM | POA: Diagnosis not present

## 2017-02-02 DIAGNOSIS — G609 Hereditary and idiopathic neuropathy, unspecified: Secondary | ICD-10-CM | POA: Diagnosis not present

## 2017-02-07 ENCOUNTER — Ambulatory Visit (INDEPENDENT_AMBULATORY_CARE_PROVIDER_SITE_OTHER): Payer: Medicare Other | Admitting: Orthopaedic Surgery

## 2017-02-08 ENCOUNTER — Ambulatory Visit (INDEPENDENT_AMBULATORY_CARE_PROVIDER_SITE_OTHER): Payer: Medicare Other

## 2017-02-08 ENCOUNTER — Encounter (INDEPENDENT_AMBULATORY_CARE_PROVIDER_SITE_OTHER): Payer: Self-pay | Admitting: Orthopaedic Surgery

## 2017-02-08 ENCOUNTER — Ambulatory Visit (INDEPENDENT_AMBULATORY_CARE_PROVIDER_SITE_OTHER): Payer: Medicare Other | Admitting: Orthopaedic Surgery

## 2017-02-08 VITALS — BP 195/89 | HR 66

## 2017-02-08 DIAGNOSIS — R2681 Unsteadiness on feet: Secondary | ICD-10-CM | POA: Diagnosis not present

## 2017-02-08 DIAGNOSIS — M545 Low back pain: Secondary | ICD-10-CM | POA: Diagnosis not present

## 2017-02-08 DIAGNOSIS — R102 Pelvic and perineal pain: Secondary | ICD-10-CM | POA: Diagnosis not present

## 2017-02-08 DIAGNOSIS — G8929 Other chronic pain: Secondary | ICD-10-CM | POA: Diagnosis not present

## 2017-02-08 DIAGNOSIS — R5381 Other malaise: Secondary | ICD-10-CM | POA: Diagnosis not present

## 2017-02-08 DIAGNOSIS — M4807 Spinal stenosis, lumbosacral region: Secondary | ICD-10-CM | POA: Diagnosis not present

## 2017-02-08 DIAGNOSIS — S3210XD Unspecified fracture of sacrum, subsequent encounter for fracture with routine healing: Secondary | ICD-10-CM

## 2017-02-08 DIAGNOSIS — G609 Hereditary and idiopathic neuropathy, unspecified: Secondary | ICD-10-CM | POA: Diagnosis not present

## 2017-02-08 DIAGNOSIS — M6281 Muscle weakness (generalized): Secondary | ICD-10-CM | POA: Diagnosis not present

## 2017-02-08 DIAGNOSIS — M79605 Pain in left leg: Secondary | ICD-10-CM | POA: Diagnosis not present

## 2017-02-08 NOTE — Progress Notes (Signed)
Office Visit Note   Patient: [name]           Date of Birth: May 03, 1941           MRN: [id number] Visit Date: 02/08/2017              Requested by: Briscoe Deutscher, [address] [place], [place] [address] PCP: Briscoe Deutscher, DO   Assessment & Plan: Visit Diagnoses:  1. Closed fracture of sacrum with routine healing, unspecified fracture morphology, subsequent encounter     Plan: Patient continue to work on daily walking try to gradually increase her distance increase her overall endurance and mobility. I cautioned her not to get up without assistance until she is made enough improvement that therapist states she is able to do this on her own. She can return as needed.  Follow-Up Instructions: No Follow-up on file.   Orders:  Orders Placed This Encounter  Procedures  . XR Sacrum/Coccyx   No orders of the defined types were placed in this encounter.     Procedures: No procedures performed   Clinical Data: No additional findings.   Subjective: Chief Complaint  Patient presents with  . Lower Back - Fracture, Follow-up    sacrum    HPI 76 year old female returns for follow-up of sacral fracture she's now 3 weeks out. She states the most she is ambulated is about 200 feet. She continued to work with assisted help going from sitting to standing ambulating with a quad cane. She has the right distal radius fracture that had plating then had loss of fixation with another fall and currently she is in a cast.  Review of Systems 14 point review systems reviewed and updated.   Objective: Vital Signs: BP (!) 195/89   Pulse 66   Physical Exam  Constitutional: She is oriented to person, place, and time. She appears well-developed.  HENT:  Head: Normocephalic.  Right Ear: External ear normal.  Left Ear: External ear normal.  Eyes: Pupils are equal, round, and reactive to light.  Neck: No tracheal deviation present. No thyromegaly present.  Cardiovascular:  Normal rate.   Pulmonary/Chest: Effort normal.  Abdominal: Soft.  Musculoskeletal:  Patient has good hip range of motion is reach full extension. She has some bilateral foot numbness which is in a stocking distribution. Right short arm cast present.  Neurological: She is alert and oriented to person, place, and time.  Skin: Skin is warm and dry.  Psychiatric: She has a normal mood and affect. Her behavior is normal.    Ortho Exam  Specialty Comments:  No specialty comments available.  Imaging: No results found.   PMFS History: Patient Active Problem List   Diagnosis Date Noted  . Fall at home, initial encounter 01/07/2017  . Sacral fracture, closed ([place]) 01/07/2017  . Back pain 01/07/2017  . Thrombocytopenia ([place]) 01/07/2017  . Aftercare for healing traumatic fracture of radius, right, closed 12/15/2016  . Closed fracture of ramus of right pubis ([place]) 11/22/2016  . Fall 11/21/2016  . Open Colles' fracture of right radius 11/02/2016  . Pigmented villonodular synovitis of knee, left 03/22/2016  . Abnormal radionuclide bone scan 08/16/2015  . Ankle fracture, bimalleolar, closed 08/16/2015  . HLD (hyperlipidemia) 07/28/2015  . Fatty liver 09/09/2014  . Lumbago 07/07/2014  . Abnormality of gait 03/27/2014  . Memory loss 01/08/2014  . 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 Carcinoma of head of pancreas ([place]) 07/01/2013  . 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  Paresthesia of foot 06/06/2012  . 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 Colon polyps 06/05/2008  . Osteoarthritis 06/04/2008  . Chronic diarrhea 04/22/2008  . Hypothyroidism 02/26/2007  . Adjustment disorder with mixed anxiety and depressed mood 02/26/2007  . Unspecified glaucoma 02/26/2007  . Essential hypertension 02/26/2007  . Mitral valve disease 02/26/2007  . Allergic rhinitis 02/26/2007  . GERD 02/26/2007  . Hiatal hernia 02/26/2007  . Osteoporosis 02/26/2007   Past Medical  History:  Diagnosis Date  . Allergic rhinitis   . Cancer ([place]) 07/10/13   Pancreatic cancer with MRI scan 06-19-13  . Chronic maxillary sinusitis    neti pot  . Depression    alone a lot  . Eustachian tube dysfunction   . GERD (gastroesophageal reflux disease)   . Glaucoma   . Heart murmur    hx. "MVP" -predental antibiotics  . Hiatal hernia   . Hypertension   . Hypothyroid   . Memory loss    short term memory loss  . Mitral valve prolapse    antibiotics before dental procedures  . Neuropathy   . Osteoarthritis   . Stroke [facility])    mini storkes left leg paraylsis. patient denies weakness 01/08/14.   . Tardive dyskinesia    possibly reglan, vitamin E helps  . Urgency of urination    some UTI in past    Family History  Problem Relation Age of Onset  . Cancer Mother        Breast Cancer with Metastatic disease  . Alzheimer's disease Father   . Other Brother 73       GSW    Past Surgical History:  Procedure Laterality Date  . 1 baker cyst removed    . ABDOMINAL HYSTERECTOMY     including ovaries  . BACK SURGERY     fusion  . BLEPHAROPLASTY Bilateral    with cataract surgery  . BREAST EXCISIONAL BIOPSY     left x2  . BREAST SURGERY     Biopsy left 2 times  . COLONOSCOPY W/ POLYPECTOMY     2004 last colonoscopy, no polyps  . DILATION AND CURETTAGE OF UTERUS     x3  . ESOPHAGOGASTRODUODENOSCOPY N/A 09/11/2013   Procedure: ESOPHAGOGASTRODUODENOSCOPY (EGD);  Surgeon: Cleotis Nipper, MD;  Location: [facility] ENDOSCOPY;  Service: Endoscopy;  Laterality: N/A;  Moderate sedation okay if MAC not available  . EUS N/A 07/10/2013   Procedure: ESOPHAGEAL ENDOSCOPIC ULTRASOUND (EUS) RADIAL;  Surgeon: Arta Silence, MD;  [facility];  Service: Endoscopy;  Laterality: N/A;  . EYE SURGERY Right    cataract  . FINE NEEDLE ASPIRATION N/A 07/10/2013   Procedure: FINE NEEDLE ASPIRATION (FNA) LINEAR;  Surgeon: Arta Silence, MD;  [facility];  Service: Endoscopy;   Laterality: N/A;  possible fna  . HARDWARE REMOVAL Right 12/15/2016   Procedure: Hardware removal and tenolysis right wrist with repair reconstruction as necessary;  Surgeon: Roseanne Kaufman, MD;  Location: [place];  Service: Orthopedics;  Laterality: Right;  60 mins  . JOINT REPLACEMENT     LTKA  . KNEE SURGERY Left    x 5, total knee Left knee  . LAPAROSCOPY N/A 08/07/2013   Procedure: LAPAROSCOPY DIAGNOSTIC;  Surgeon: [name], MD;  Location: [place];  Service: General;  Laterality: N/A;  . LUMBAR SPINE SURGERY     x2  . LUMBAR SPINE SURGERY     cyst  . ORIF ANKLE FRACTURE Right 08/16/2015   Procedure: OPEN REDUCTION INTERNAL FIXATION (ORIF) ANKLE FRACTURE;  Surgeon: Meredith Pel, MD;  Location: [place];  Service: Orthopedics;  Laterality: Right;  . ORIF WRIST FRACTURE Right 11/01/2016   Procedure: OPEN REDUCTION INTERNAL FIXATION (ORIF) RIGHT WRIST FRACTURE WITH APPLICATION OF SPANNING PLATE, IRRIGATION AND DEBRIDEMENT RIGHT WRIST;  Surgeon: Roseanne Kaufman, MD;  [facility];  Service: Orthopedics;  Laterality: Right;  . RADIOACTIVE SEED GUIDED EXCISIONAL BREAST BIOPSY Left 12/15/2015   Procedure: LEFT RADIOACTIVE SEED GUIDED EXCISIONAL BREAST BIOPSY;  Surgeon: [name], MD;  Location: [place];  Service: General;  Laterality: Left;  . WHIPPLE PROCEDURE N/A 08/07/2013   Procedure: WHIPPLE PROCEDURE;  Surgeon: [name], MD;  Location: [place];  Service: General;  Laterality: N/A;   Social History   Occupational History  .  Retired  . Retired    Social History Main Topics  . Smoking status: Never Smoker  . Smokeless tobacco: Never Used  . Alcohol use No  . Drug use: No  . Sexual activity: Not Currently

## 2017-02-13 DIAGNOSIS — G8929 Other chronic pain: Secondary | ICD-10-CM | POA: Diagnosis not present

## 2017-02-13 DIAGNOSIS — R2681 Unsteadiness on feet: Secondary | ICD-10-CM | POA: Diagnosis not present

## 2017-02-13 DIAGNOSIS — S52571E Other intraarticular fracture of lower end of right radius, subsequent encounter for open fracture type I or II with routine healing: Secondary | ICD-10-CM | POA: Diagnosis not present

## 2017-02-13 DIAGNOSIS — M545 Low back pain: Secondary | ICD-10-CM | POA: Diagnosis not present

## 2017-02-13 DIAGNOSIS — G609 Hereditary and idiopathic neuropathy, unspecified: Secondary | ICD-10-CM | POA: Diagnosis not present

## 2017-02-13 DIAGNOSIS — M6281 Muscle weakness (generalized): Secondary | ICD-10-CM | POA: Diagnosis not present

## 2017-02-13 DIAGNOSIS — M4807 Spinal stenosis, lumbosacral region: Secondary | ICD-10-CM | POA: Diagnosis not present

## 2017-02-13 DIAGNOSIS — Z4789 Encounter for other orthopedic aftercare: Secondary | ICD-10-CM | POA: Diagnosis not present

## 2017-02-13 DIAGNOSIS — R5381 Other malaise: Secondary | ICD-10-CM | POA: Diagnosis not present

## 2017-02-13 DIAGNOSIS — M79605 Pain in left leg: Secondary | ICD-10-CM | POA: Diagnosis not present

## 2017-02-13 DIAGNOSIS — M25531 Pain in right wrist: Secondary | ICD-10-CM | POA: Diagnosis not present

## 2017-02-13 DIAGNOSIS — R102 Pelvic and perineal pain: Secondary | ICD-10-CM | POA: Diagnosis not present

## 2017-02-15 ENCOUNTER — Encounter: Payer: Self-pay | Admitting: Cardiovascular Disease

## 2017-02-15 ENCOUNTER — Ambulatory Visit (INDEPENDENT_AMBULATORY_CARE_PROVIDER_SITE_OTHER): Payer: PRIVATE HEALTH INSURANCE | Admitting: Cardiovascular Disease

## 2017-02-15 VITALS — BP 146/81 | HR 71 | Ht 61.0 in | Wt 146.8 lb

## 2017-02-15 DIAGNOSIS — I10 Essential (primary) hypertension: Secondary | ICD-10-CM | POA: Diagnosis not present

## 2017-02-15 DIAGNOSIS — R609 Edema, unspecified: Secondary | ICD-10-CM

## 2017-02-15 DIAGNOSIS — E785 Hyperlipidemia, unspecified: Secondary | ICD-10-CM

## 2017-02-15 DIAGNOSIS — R0602 Shortness of breath: Secondary | ICD-10-CM | POA: Diagnosis not present

## 2017-02-15 MED ORDER — ATENOLOL 50 MG PO TABS: 50.0000 mg | ORAL_TABLET | Freq: Every morning | ORAL | 1 refills | Status: DC
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
## 2017-02-15 NOTE — Patient Instructions (Addendum)
Medication Instructions:  INCREASE YOUR ATENOLOL TO 5 MG DAILY   Labwork: FASTING LP/BNP WHEN YOU GET YOUR ECHO  Testing/Procedures: Your physician has requested that you have an echocardiogram. Echocardiography is a painless test that uses sound waves to create images of your heart. It provides your doctor with information about the size and shape of your heart and how well your heart's chambers and valves are working. This procedure takes approximately one hour. There are no restrictions for this procedure. [place] [address]   Follow-Up: Your physician recommends that you schedule a follow-up appointment in: 6 WEEKS   Any Other Special Instructions Will Be Listed Below (If Applicable). MONITOR BLOOD PRESSURE AND HEART RATE DAILY FOR 2 WEEKS  BRING WITH YOU TO YOU FOLLOW UP   TRY WEARING COMPRESSION STOCKINGS   If you need a refill on your cardiac medications before your next appointment, please call your pharmacy.

## 2017-02-17 ENCOUNTER — Other Ambulatory Visit (HOSPITAL_BASED_OUTPATIENT_CLINIC_OR_DEPARTMENT_OTHER): Payer: Medicare Other

## 2017-02-17 ENCOUNTER — Ambulatory Visit (HOSPITAL_BASED_OUTPATIENT_CLINIC_OR_DEPARTMENT_OTHER): Payer: Medicare Other | Admitting: Oncology

## 2017-02-17 ENCOUNTER — Telehealth: Payer: Self-pay | Admitting: Oncology

## 2017-02-17 VITALS — BP 162/64 | HR 70 | Temp 98.3°F | Resp 17 | Ht 61.0 in

## 2017-02-17 DIAGNOSIS — C259 Malignant neoplasm of pancreas, unspecified: Secondary | ICD-10-CM | POA: Diagnosis not present

## 2017-02-17 DIAGNOSIS — C250 Malignant neoplasm of head of pancreas: Secondary | ICD-10-CM
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
## 2017-02-17 NOTE — Telephone Encounter (Signed)
Scheduled appt per 9/21 los - Gave patient AVS and calender per los.  

## 2017-02-18 LAB — CANCER ANTIGEN 19-9: CAN 19-9: 1 U/mL (ref 0–35)

## 2017-02-20 DIAGNOSIS — C259 Malignant neoplasm of pancreas, unspecified: Secondary | ICD-10-CM | POA: Diagnosis not present

## 2017-02-20 DIAGNOSIS — S3210XD Unspecified fracture of sacrum, subsequent encounter for fracture with routine healing: Secondary | ICD-10-CM | POA: Diagnosis not present

## 2017-02-20 DIAGNOSIS — S5291XD Unspecified fracture of right forearm, subsequent encounter for closed fracture with routine healing: Secondary | ICD-10-CM | POA: Diagnosis not present

## 2017-02-20 DIAGNOSIS — S32501D Unspecified fracture of right pubis, subsequent encounter for fracture with routine healing: Secondary | ICD-10-CM | POA: Diagnosis not present

## 2017-02-21 DIAGNOSIS — Z9181 History of falling: Secondary | ICD-10-CM | POA: Diagnosis not present

## 2017-02-21 DIAGNOSIS — W19XXXD Unspecified fall, subsequent encounter: Secondary | ICD-10-CM | POA: Diagnosis not present

## 2017-02-21 DIAGNOSIS — S3210XD Unspecified fracture of sacrum, subsequent encounter for fracture with routine healing: Secondary | ICD-10-CM | POA: Diagnosis not present

## 2017-02-21 DIAGNOSIS — M810 Age-related osteoporosis without current pathological fracture: Secondary | ICD-10-CM | POA: Diagnosis not present

## 2017-02-21 DIAGNOSIS — Z90411 Acquired partial absence of pancreas: Secondary | ICD-10-CM | POA: Diagnosis not present

## 2017-02-21 DIAGNOSIS — Z8507 Personal history of malignant neoplasm of pancreas: Secondary | ICD-10-CM | POA: Diagnosis not present

## 2017-02-21 DIAGNOSIS — Z7982 Long term (current) use of aspirin: Secondary | ICD-10-CM | POA: Diagnosis not present

## 2017-02-21 DIAGNOSIS — S32501D Unspecified fracture of right pubis, subsequent encounter for fracture with routine healing: Secondary | ICD-10-CM | POA: Diagnosis not present

## 2017-02-21 DIAGNOSIS — I2510 Atherosclerotic heart disease of native coronary artery without angina pectoris: Secondary | ICD-10-CM | POA: Diagnosis not present

## 2017-02-21 DIAGNOSIS — S5291XD Unspecified fracture of right forearm, subsequent encounter for closed fracture with routine healing: Secondary | ICD-10-CM | POA: Diagnosis not present

## 2017-02-21 DIAGNOSIS — F331 Major depressive disorder, recurrent, moderate: Secondary | ICD-10-CM | POA: Diagnosis not present

## 2017-02-21 DIAGNOSIS — G629 Polyneuropathy, unspecified: Secondary | ICD-10-CM | POA: Diagnosis not present

## 2017-02-21 DIAGNOSIS — Z8673 Personal history of transient ischemic attack (TIA), and cerebral infarction without residual deficits: Secondary | ICD-10-CM | POA: Diagnosis not present

## 2017-02-21 DIAGNOSIS — I10 Essential (primary) hypertension: Secondary | ICD-10-CM | POA: Diagnosis not present

## 2017-02-21 DIAGNOSIS — M1990 Unspecified osteoarthritis, unspecified site: Secondary | ICD-10-CM | POA: Diagnosis not present

## 2017-02-22 ENCOUNTER — Telehealth: Payer: Self-pay | Admitting: Family Medicine

## 2017-02-22 DIAGNOSIS — G629 Polyneuropathy, unspecified: Secondary | ICD-10-CM | POA: Diagnosis not present

## 2017-02-22 DIAGNOSIS — F331 Major depressive disorder, recurrent, moderate: Secondary | ICD-10-CM | POA: Diagnosis not present

## 2017-02-22 DIAGNOSIS — S3210XD Unspecified fracture of sacrum, subsequent encounter for fracture with routine healing: Secondary | ICD-10-CM | POA: Diagnosis not present

## 2017-02-22 DIAGNOSIS — E559 Vitamin D deficiency, unspecified: Secondary | ICD-10-CM | POA: Diagnosis not present

## 2017-02-22 DIAGNOSIS — D518 Other vitamin B12 deficiency anemias: Secondary | ICD-10-CM | POA: Diagnosis not present

## 2017-02-22 DIAGNOSIS — S5291XD Unspecified fracture of right forearm, subsequent encounter for closed fracture with routine healing: Secondary | ICD-10-CM | POA: Diagnosis not present

## 2017-02-22 DIAGNOSIS — S32501D Unspecified fracture of right pubis, subsequent encounter for fracture with routine healing: Secondary | ICD-10-CM | POA: Diagnosis not present

## 2017-02-22 DIAGNOSIS — W19XXXD Unspecified fall, subsequent encounter: Secondary | ICD-10-CM | POA: Diagnosis not present

## 2017-02-22 DIAGNOSIS — E119 Type 2 diabetes mellitus without complications: Secondary | ICD-10-CM | POA: Diagnosis not present

## 2017-02-22 DIAGNOSIS — Z79899 Other long term (current) drug therapy: Secondary | ICD-10-CM | POA: Diagnosis not present

## 2017-02-22 DIAGNOSIS — E782 Mixed hyperlipidemia: Secondary | ICD-10-CM | POA: Diagnosis not present

## 2017-02-22 DIAGNOSIS — E038 Other specified hypothyroidism: Secondary | ICD-10-CM | POA: Diagnosis not present

## 2017-02-22 NOTE — Telephone Encounter (Signed)
Called and gave verbal orders to [place].

## 2017-02-22 NOTE — Telephone Encounter (Signed)
[name] with [facility] home [facility] called in reference to needing verbal orders for 1 week 1, 2 week 3, 1 week 1 for patient. Please call [name] and advise.

## 2017-02-23 DIAGNOSIS — W19XXXD Unspecified fall, subsequent encounter: Secondary | ICD-10-CM | POA: Diagnosis not present

## 2017-02-23 DIAGNOSIS — S5291XD Unspecified fracture of right forearm, subsequent encounter for closed fracture with routine healing: Secondary | ICD-10-CM | POA: Diagnosis not present

## 2017-02-23 DIAGNOSIS — T43225A Adverse effect of selective serotonin reuptake inhibitors, initial encounter: Secondary | ICD-10-CM | POA: Diagnosis not present

## 2017-02-23 DIAGNOSIS — S3210XD Unspecified fracture of sacrum, subsequent encounter for fracture with routine healing: Secondary | ICD-10-CM | POA: Diagnosis not present

## 2017-02-23 DIAGNOSIS — F411 Generalized anxiety disorder: Secondary | ICD-10-CM | POA: Diagnosis not present

## 2017-02-23 DIAGNOSIS — F331 Major depressive disorder, recurrent, moderate: Secondary | ICD-10-CM | POA: Diagnosis not present

## 2017-02-23 DIAGNOSIS — G629 Polyneuropathy, unspecified: Secondary | ICD-10-CM | POA: Diagnosis not present

## 2017-02-23 DIAGNOSIS — S32501D Unspecified fracture of right pubis, subsequent encounter for fracture with routine healing: Secondary | ICD-10-CM | POA: Diagnosis not present

## 2017-02-27 ENCOUNTER — Other Ambulatory Visit (HOSPITAL_COMMUNITY): Payer: Medicare Other

## 2017-02-27 DIAGNOSIS — W19XXXD Unspecified fall, subsequent encounter: Secondary | ICD-10-CM | POA: Diagnosis not present

## 2017-02-27 DIAGNOSIS — G629 Polyneuropathy, unspecified: Secondary | ICD-10-CM | POA: Diagnosis not present

## 2017-02-27 DIAGNOSIS — S5291XD Unspecified fracture of right forearm, subsequent encounter for closed fracture with routine healing: Secondary | ICD-10-CM | POA: Diagnosis not present

## 2017-02-27 DIAGNOSIS — F331 Major depressive disorder, recurrent, moderate: Secondary | ICD-10-CM | POA: Diagnosis not present

## 2017-02-27 DIAGNOSIS — E039 Hypothyroidism, unspecified: Secondary | ICD-10-CM | POA: Diagnosis not present

## 2017-02-27 DIAGNOSIS — S3210XD Unspecified fracture of sacrum, subsequent encounter for fracture with routine healing: Secondary | ICD-10-CM | POA: Diagnosis not present

## 2017-02-27 DIAGNOSIS — S32501D Unspecified fracture of right pubis, subsequent encounter for fracture with routine healing: Secondary | ICD-10-CM | POA: Diagnosis not present

## 2017-02-27 DIAGNOSIS — R899 Unspecified abnormal finding in specimens from other organs, systems and tissues: Secondary | ICD-10-CM | POA: Diagnosis not present

## 2017-02-28 ENCOUNTER — Other Ambulatory Visit: Payer: Self-pay

## 2017-02-28 ENCOUNTER — Ambulatory Visit (HOSPITAL_COMMUNITY): Payer: Medicare Other | Attending: Cardiology

## 2017-02-28 DIAGNOSIS — Z90411 Acquired partial absence of pancreas: Secondary | ICD-10-CM | POA: Diagnosis not present

## 2017-02-28 DIAGNOSIS — G629 Polyneuropathy, unspecified: Secondary | ICD-10-CM | POA: Diagnosis not present

## 2017-02-28 DIAGNOSIS — Z8507 Personal history of malignant neoplasm of pancreas: Secondary | ICD-10-CM | POA: Insufficient documentation

## 2017-02-28 DIAGNOSIS — I10 Essential (primary) hypertension: Secondary | ICD-10-CM | POA: Insufficient documentation

## 2017-02-28 DIAGNOSIS — I341 Nonrheumatic mitral (valve) prolapse: Secondary | ICD-10-CM | POA: Diagnosis not present

## 2017-02-28 DIAGNOSIS — W19XXXD Unspecified fall, subsequent encounter: Secondary | ICD-10-CM | POA: Diagnosis not present

## 2017-02-28 DIAGNOSIS — S32501D Unspecified fracture of right pubis, subsequent encounter for fracture with routine healing: Secondary | ICD-10-CM | POA: Diagnosis not present

## 2017-02-28 DIAGNOSIS — F331 Major depressive disorder, recurrent, moderate: Secondary | ICD-10-CM | POA: Diagnosis not present

## 2017-02-28 DIAGNOSIS — S3210XD Unspecified fracture of sacrum, subsequent encounter for fracture with routine healing: Secondary | ICD-10-CM | POA: Diagnosis not present

## 2017-02-28 DIAGNOSIS — R609 Edema, unspecified: Secondary | ICD-10-CM

## 2017-02-28 DIAGNOSIS — R0602 Shortness of breath: Secondary | ICD-10-CM

## 2017-02-28 DIAGNOSIS — S5291XD Unspecified fracture of right forearm, subsequent encounter for closed fracture with routine healing: Secondary | ICD-10-CM | POA: Diagnosis not present

## 2017-03-02 ENCOUNTER — Other Ambulatory Visit: Payer: Self-pay | Admitting: Cardiovascular Disease

## 2017-03-02 DIAGNOSIS — S5291XD Unspecified fracture of right forearm, subsequent encounter for closed fracture with routine healing: Secondary | ICD-10-CM | POA: Diagnosis not present

## 2017-03-02 DIAGNOSIS — G629 Polyneuropathy, unspecified: Secondary | ICD-10-CM | POA: Diagnosis not present

## 2017-03-02 DIAGNOSIS — W19XXXD Unspecified fall, subsequent encounter: Secondary | ICD-10-CM | POA: Diagnosis not present

## 2017-03-02 DIAGNOSIS — S32501D Unspecified fracture of right pubis, subsequent encounter for fracture with routine healing: Secondary | ICD-10-CM | POA: Diagnosis not present

## 2017-03-02 DIAGNOSIS — F331 Major depressive disorder, recurrent, moderate: Secondary | ICD-10-CM | POA: Diagnosis not present

## 2017-03-02 DIAGNOSIS — S3210XD Unspecified fracture of sacrum, subsequent encounter for fracture with routine healing: Secondary | ICD-10-CM | POA: Diagnosis not present

## 2017-03-02 MED ORDER — ATENOLOL 50 MG PO TABS: 50.0000 mg | ORAL_TABLET | Freq: Every morning | ORAL | 1 refills | Status: DC

## 2017-03-02 NOTE — Telephone Encounter (Signed)
Pt need a new prescription for her Atenolol faxed to her [facility] please. Please fax it to [phone].Pt blood pressure having been going up quite a bit. She is now on 25mg  of Atenolol.She needs the new prescription with the new dose Dr Oval Linsey prescribed sent to the facility.

## 2017-03-02 NOTE — Telephone Encounter (Signed)
Returned call to patient's son [name]. Patient was advised on 9/19 by MD to increase atenolol to 50mg  daily. This was sent to [facility] but patient never got Rx b/c she is in [facility] assisted [facility] in [place]. Son reports BP is going up.   (p) [phone]  [facility] and spoke with nurse and an order for increased atenolol dose (50mg ) will need to be faxed to [phone]

## 2017-03-02 NOTE — Telephone Encounter (Signed)
Faxed Rx to [facility]

## 2017-03-02 NOTE — Telephone Encounter (Signed)
Rx printed and will be signed by PA

## 2017-03-03 DIAGNOSIS — F411 Generalized anxiety disorder: Secondary | ICD-10-CM | POA: Diagnosis not present

## 2017-03-03 DIAGNOSIS — T43225A Adverse effect of selective serotonin reuptake inhibitors, initial encounter: Secondary | ICD-10-CM | POA: Diagnosis not present

## 2017-03-03 DIAGNOSIS — Z23 Encounter for immunization: Secondary | ICD-10-CM | POA: Diagnosis not present

## 2017-03-04 DIAGNOSIS — R030 Elevated blood-pressure reading, without diagnosis of hypertension: Secondary | ICD-10-CM | POA: Diagnosis not present

## 2017-03-04 DIAGNOSIS — R51 Headache: Secondary | ICD-10-CM | POA: Diagnosis not present

## 2017-03-04 DIAGNOSIS — R0602 Shortness of breath: Secondary | ICD-10-CM | POA: Diagnosis not present

## 2017-03-04 DIAGNOSIS — R0600 Dyspnea, unspecified: Secondary | ICD-10-CM | POA: Diagnosis not present

## 2017-03-04 DIAGNOSIS — R0601 Orthopnea: Secondary | ICD-10-CM | POA: Diagnosis not present

## 2017-03-04 DIAGNOSIS — R404 Transient alteration of awareness: Secondary | ICD-10-CM | POA: Diagnosis not present

## 2017-03-04 DIAGNOSIS — I160 Hypertensive urgency: Secondary | ICD-10-CM | POA: Diagnosis not present

## 2017-03-04 DIAGNOSIS — R531 Weakness: Secondary | ICD-10-CM | POA: Diagnosis not present

## 2017-03-04 DIAGNOSIS — I10 Essential (primary) hypertension: Secondary | ICD-10-CM | POA: Diagnosis not present

## 2017-03-05 DIAGNOSIS — R0602 Shortness of breath: Secondary | ICD-10-CM | POA: Diagnosis not present

## 2017-03-06 DIAGNOSIS — W19XXXD Unspecified fall, subsequent encounter: Secondary | ICD-10-CM | POA: Diagnosis not present

## 2017-03-06 DIAGNOSIS — S3210XD Unspecified fracture of sacrum, subsequent encounter for fracture with routine healing: Secondary | ICD-10-CM | POA: Diagnosis not present

## 2017-03-06 DIAGNOSIS — G629 Polyneuropathy, unspecified: Secondary | ICD-10-CM | POA: Diagnosis not present

## 2017-03-06 DIAGNOSIS — S5291XD Unspecified fracture of right forearm, subsequent encounter for closed fracture with routine healing: Secondary | ICD-10-CM | POA: Diagnosis not present

## 2017-03-06 DIAGNOSIS — F331 Major depressive disorder, recurrent, moderate: Secondary | ICD-10-CM | POA: Diagnosis not present

## 2017-03-06 DIAGNOSIS — S32501D Unspecified fracture of right pubis, subsequent encounter for fracture with routine healing: Secondary | ICD-10-CM | POA: Diagnosis not present

## 2017-03-07 DIAGNOSIS — W19XXXD Unspecified fall, subsequent encounter: Secondary | ICD-10-CM | POA: Diagnosis not present

## 2017-03-07 DIAGNOSIS — S3210XD Unspecified fracture of sacrum, subsequent encounter for fracture with routine healing: Secondary | ICD-10-CM | POA: Diagnosis not present

## 2017-03-07 DIAGNOSIS — G629 Polyneuropathy, unspecified: Secondary | ICD-10-CM | POA: Diagnosis not present

## 2017-03-07 DIAGNOSIS — S32501D Unspecified fracture of right pubis, subsequent encounter for fracture with routine healing: Secondary | ICD-10-CM | POA: Diagnosis not present

## 2017-03-07 DIAGNOSIS — S5291XD Unspecified fracture of right forearm, subsequent encounter for closed fracture with routine healing: Secondary | ICD-10-CM | POA: Diagnosis not present

## 2017-03-07 DIAGNOSIS — F331 Major depressive disorder, recurrent, moderate: Secondary | ICD-10-CM | POA: Diagnosis not present

## 2017-03-08 ENCOUNTER — Encounter: Payer: Self-pay | Admitting: Family Medicine

## 2017-03-08 ENCOUNTER — Ambulatory Visit (INDEPENDENT_AMBULATORY_CARE_PROVIDER_SITE_OTHER): Payer: Medicare Other | Admitting: Family Medicine

## 2017-03-08 VITALS — BP 160/100 | HR 86 | Temp 98.6°F | Resp 16 | Ht 61.0 in | Wt 146.0 lb

## 2017-03-08 DIAGNOSIS — R300 Dysuria: Secondary | ICD-10-CM | POA: Diagnosis not present

## 2017-03-08 DIAGNOSIS — N393 Stress incontinence (female) (male): Secondary | ICD-10-CM

## 2017-03-08 DIAGNOSIS — S52571E Other intraarticular fracture of lower end of right radius, subsequent encounter for open fracture type I or II with routine healing: Secondary | ICD-10-CM | POA: Diagnosis not present

## 2017-03-08 LAB — POCT URINALYSIS DIP (MANUAL ENTRY)
Glucose, UA: NEGATIVE mg/dL
Ketones, POC UA: NEGATIVE mg/dL
NITRITE UA: NEGATIVE
PH UA: 6 (ref 5.0–8.0)
PROTEIN UA: NEGATIVE mg/dL
RBC UA: NEGATIVE
SPEC GRAV UA: 1.02 (ref 1.010–1.025)
UROBILINOGEN UA: 0.2 U/dL

## 2017-03-08 MED ORDER — CIPROFLOXACIN HCL 500 MG PO TABS: 500.0000 mg | ORAL_TABLET | Freq: Two times a day (BID) | ORAL | 0 refills | Status: DC

## 2017-03-08 NOTE — Patient Instructions (Signed)

## 2017-03-08 NOTE — Progress Notes (Signed)
Subjective:     Patient ID: [name], female   DOB: 02-04-1941, 76 y.o.   MRN: [id number]  HPI Patient seen as a work-in with 3 history of intermittent burning with urination. She has long-standing history of some stress incontinence. She denies any flank pain.  Mild increased urine frequency. No nausea or vomiting.  No gross hematuria. She has allergies reported to sulfa and several other medications. She has taken Cipro in the past she states without difficulty. She has some mild suprapubic discomfort  Past Medical History:  Diagnosis Date  . Allergic rhinitis   . Cancer ([place]) 07/10/13   Pancreatic cancer with MRI scan 06-19-13  . Chronic maxillary sinusitis    neti pot  . Depression    alone a lot  . Eustachian tube dysfunction   . GERD (gastroesophageal reflux disease)   . Glaucoma   . Heart murmur    hx. "MVP" -predental antibiotics  . Hiatal hernia   . Hypertension   . Hypothyroid   . Memory loss    short term memory loss  . Mitral valve prolapse    antibiotics before dental procedures  . Neuropathy   . Osteoarthritis   . Stroke [facility])    mini storkes left leg paraylsis. patient denies weakness 01/08/14.   . Tardive dyskinesia    possibly reglan, vitamin E helps  . Urgency of urination    some UTI in past   Past Surgical History:  Procedure Laterality Date  . 1 baker cyst removed    . ABDOMINAL HYSTERECTOMY     including ovaries  . BACK SURGERY     fusion  . BLEPHAROPLASTY Bilateral    with cataract surgery  . BREAST EXCISIONAL BIOPSY     left x2  . BREAST SURGERY     Biopsy left 2 times  . COLONOSCOPY W/ POLYPECTOMY     2004 last colonoscopy, no polyps  . DILATION AND CURETTAGE OF UTERUS     x3  . ESOPHAGOGASTRODUODENOSCOPY N/A 09/11/2013   Procedure: ESOPHAGOGASTRODUODENOSCOPY (EGD);  Surgeon: Cleotis Nipper, MD;  Location: [facility] ENDOSCOPY;  Service: Endoscopy;  Laterality: N/A;  Moderate sedation okay if MAC not available  . EUS N/A 07/10/2013   Procedure: ESOPHAGEAL ENDOSCOPIC ULTRASOUND (EUS) RADIAL;  Surgeon: Arta Silence, MD;  [facility];  Service: Endoscopy;  Laterality: N/A;  . EYE SURGERY Right    cataract  . FINE NEEDLE ASPIRATION N/A 07/10/2013   Procedure: FINE NEEDLE ASPIRATION (FNA) LINEAR;  Surgeon: Arta Silence, MD;  [facility];  Service: Endoscopy;  Laterality: N/A;  possible fna  . HARDWARE REMOVAL Right 12/15/2016   Procedure: Hardware removal and tenolysis right wrist with repair reconstruction as necessary;  Surgeon: Roseanne Kaufman, MD;  Location: [place];  Service: Orthopedics;  Laterality: Right;  60 mins  . JOINT REPLACEMENT     LTKA  . KNEE SURGERY Left    x 5, total knee Left knee  . LAPAROSCOPY N/A 08/07/2013   Procedure: LAPAROSCOPY DIAGNOSTIC;  Surgeon: [name], MD;  Location: [place];  Service: General;  Laterality: N/A;  . LUMBAR SPINE SURGERY     x2  . LUMBAR SPINE SURGERY     cyst  . ORIF ANKLE FRACTURE Right 08/16/2015   Procedure: OPEN REDUCTION INTERNAL FIXATION (ORIF) ANKLE FRACTURE;  Surgeon: Meredith Pel, MD;  Location: [place];  Service: Orthopedics;  Laterality: Right;  . ORIF WRIST FRACTURE Right 11/01/2016   Procedure: OPEN REDUCTION INTERNAL FIXATION (ORIF) RIGHT  WRIST FRACTURE WITH APPLICATION OF SPANNING PLATE, IRRIGATION AND DEBRIDEMENT RIGHT WRIST;  Surgeon: Roseanne Kaufman, MD;  [facility];  Service: Orthopedics;  Laterality: Right;  . RADIOACTIVE SEED GUIDED EXCISIONAL BREAST BIOPSY Left 12/15/2015   Procedure: LEFT RADIOACTIVE SEED GUIDED EXCISIONAL BREAST BIOPSY;  Surgeon: [name], MD;  Location: [place];  Service: General;  Laterality: Left;  . WHIPPLE PROCEDURE N/A 08/07/2013   Procedure: WHIPPLE PROCEDURE;  Surgeon: [name], MD;  Location: [place];  Service: General;  Laterality: N/A;    reports that she has never smoked. She has never used smokeless tobacco. She reports that she does not drink alcohol or use drugs. family history includes  Alzheimer's disease in her father; Cancer in her mother; Other (age of onset: 59) in her brother. Allergies  Allergen Reactions  . Other Other (See Comments)    According to patient Trilafor and Reglan cause Tardive Dyskinesia  . Metoclopramide Hcl Other (See Comments)    Shaking; caused tardive dyskinesia  . Niacin Other (See Comments)    shaking  . Trovan [Alatrofloxacin Mesylate] Other (See Comments)    Caused shaking and nervousness  . Benzocaine-Resorcinol Rash  . Celecoxib Other (See Comments)    unknown  . Erythromycin Base Rash  . Glucosamine Other (See Comments)    unknown  . Nortriptyline Other (See Comments)    Dizziness   . Phenazopyridine Hcl Other (See Comments)    unknown  . Sulfa Antibiotics Nausea And Vomiting and Rash  . Sulfonamide Derivatives Nausea And Vomiting and Rash     Review of Systems  Constitutional: Negative for chills and fever.  Gastrointestinal: Negative for nausea and vomiting.  Genitourinary: Positive for dysuria. Negative for flank pain and hematuria.       Objective:   Physical Exam  Constitutional: She appears well-developed and well-nourished.  Cardiovascular: Normal rate and regular rhythm.   Pulmonary/Chest: Effort normal and breath sounds normal. No respiratory distress. She has no wheezes. She has no rales.       Assessment:     Three-day history of dysuria with intermittent burning with urination. Urine dipstick reveals 3+ leukocytes. Negative nitrites and negative blood    Plan:     -Urine culture sent -Start Cipro 500 mg twice a day for 7 days pending culture results -Stay well-hydrated -Follow-up promptly for any fever or worsening symptoms  Eulas Post MD [facility]

## 2017-03-09 ENCOUNTER — Telehealth: Payer: Self-pay | Admitting: Family Medicine

## 2017-03-09 DIAGNOSIS — S5291XD Unspecified fracture of right forearm, subsequent encounter for closed fracture with routine healing: Secondary | ICD-10-CM | POA: Diagnosis not present

## 2017-03-09 DIAGNOSIS — R51 Headache: Secondary | ICD-10-CM | POA: Diagnosis not present

## 2017-03-09 DIAGNOSIS — I160 Hypertensive urgency: Secondary | ICD-10-CM | POA: Diagnosis not present

## 2017-03-09 DIAGNOSIS — J9690 Respiratory failure, unspecified, unspecified whether with hypoxia or hypercapnia: Secondary | ICD-10-CM | POA: Diagnosis not present

## 2017-03-09 DIAGNOSIS — I10 Essential (primary) hypertension: Secondary | ICD-10-CM | POA: Diagnosis not present

## 2017-03-09 DIAGNOSIS — E785 Hyperlipidemia, unspecified: Secondary | ICD-10-CM | POA: Diagnosis not present

## 2017-03-09 DIAGNOSIS — N200 Calculus of kidney: Secondary | ICD-10-CM | POA: Diagnosis not present

## 2017-03-09 DIAGNOSIS — Z79899 Other long term (current) drug therapy: Secondary | ICD-10-CM | POA: Diagnosis not present

## 2017-03-09 DIAGNOSIS — K219 Gastro-esophageal reflux disease without esophagitis: Secondary | ICD-10-CM | POA: Diagnosis not present

## 2017-03-09 DIAGNOSIS — Z23 Encounter for immunization: Secondary | ICD-10-CM | POA: Diagnosis not present

## 2017-03-09 DIAGNOSIS — E039 Hypothyroidism, unspecified: Secondary | ICD-10-CM | POA: Diagnosis not present

## 2017-03-09 DIAGNOSIS — W19XXXD Unspecified fall, subsequent encounter: Secondary | ICD-10-CM | POA: Diagnosis not present

## 2017-03-09 DIAGNOSIS — F331 Major depressive disorder, recurrent, moderate: Secondary | ICD-10-CM | POA: Diagnosis not present

## 2017-03-09 DIAGNOSIS — G629 Polyneuropathy, unspecified: Secondary | ICD-10-CM | POA: Diagnosis not present

## 2017-03-09 DIAGNOSIS — S3210XD Unspecified fracture of sacrum, subsequent encounter for fracture with routine healing: Secondary | ICD-10-CM | POA: Diagnosis not present

## 2017-03-09 DIAGNOSIS — Z7982 Long term (current) use of aspirin: Secondary | ICD-10-CM | POA: Diagnosis not present

## 2017-03-09 DIAGNOSIS — S32501D Unspecified fracture of right pubis, subsequent encounter for fracture with routine healing: Secondary | ICD-10-CM | POA: Diagnosis not present

## 2017-03-09 DIAGNOSIS — F419 Anxiety disorder, unspecified: Secondary | ICD-10-CM | POA: Diagnosis not present

## 2017-03-09 DIAGNOSIS — N3000 Acute cystitis without hematuria: Secondary | ICD-10-CM | POA: Diagnosis not present

## 2017-03-09 NOTE — Telephone Encounter (Signed)
[facility] calling in reference to Pharmacy not filling ciprofloxacin (CIPRO) 500 MG tablet because patient has allergy to "grodan". Please call pharmacy and advise.

## 2017-03-10 DIAGNOSIS — R51 Headache: Secondary | ICD-10-CM | POA: Diagnosis not present

## 2017-03-10 DIAGNOSIS — N390 Urinary tract infection, site not specified: Secondary | ICD-10-CM | POA: Diagnosis not present

## 2017-03-10 DIAGNOSIS — I160 Hypertensive urgency: Secondary | ICD-10-CM | POA: Diagnosis not present

## 2017-03-10 DIAGNOSIS — I10 Essential (primary) hypertension: Secondary | ICD-10-CM | POA: Diagnosis not present

## 2017-03-10 DIAGNOSIS — J9690 Respiratory failure, unspecified, unspecified whether with hypoxia or hypercapnia: Secondary | ICD-10-CM | POA: Diagnosis not present

## 2017-03-10 DIAGNOSIS — E7800 Pure hypercholesterolemia, unspecified: Secondary | ICD-10-CM | POA: Diagnosis not present

## 2017-03-10 DIAGNOSIS — K219 Gastro-esophageal reflux disease without esophagitis: Secondary | ICD-10-CM | POA: Diagnosis not present

## 2017-03-10 DIAGNOSIS — N200 Calculus of kidney: Secondary | ICD-10-CM | POA: Diagnosis not present

## 2017-03-10 DIAGNOSIS — E039 Hypothyroidism, unspecified: Secondary | ICD-10-CM | POA: Diagnosis not present

## 2017-03-10 NOTE — Telephone Encounter (Signed)
I spoke with Nanine Means and advised that they would have to call the office where patient was seen.

## 2017-03-11 ENCOUNTER — Other Ambulatory Visit: Payer: Self-pay | Admitting: Family Medicine

## 2017-03-11 DIAGNOSIS — I161 Hypertensive emergency: Secondary | ICD-10-CM | POA: Diagnosis not present

## 2017-03-11 DIAGNOSIS — N390 Urinary tract infection, site not specified: Secondary | ICD-10-CM | POA: Diagnosis not present

## 2017-03-11 LAB — URINE CULTURE
MICRO NUMBER:: 81129397
SPECIMEN QUALITY:: ADEQUATE

## 2017-03-13 DIAGNOSIS — N390 Urinary tract infection, site not specified: Secondary | ICD-10-CM | POA: Diagnosis not present

## 2017-03-13 DIAGNOSIS — Z9889 Other specified postprocedural states: Secondary | ICD-10-CM | POA: Diagnosis not present

## 2017-03-13 DIAGNOSIS — I10 Essential (primary) hypertension: Secondary | ICD-10-CM | POA: Diagnosis not present

## 2017-03-13 DIAGNOSIS — S5291XD Unspecified fracture of right forearm, subsequent encounter for closed fracture with routine healing: Secondary | ICD-10-CM | POA: Diagnosis not present

## 2017-03-15 ENCOUNTER — Inpatient Hospital Stay: Payer: Medicare Other | Admitting: Family Medicine

## 2017-03-15 ENCOUNTER — Telehealth: Payer: Self-pay | Admitting: Cardiovascular Disease

## 2017-03-15 ENCOUNTER — Telehealth: Payer: Self-pay | Admitting: *Deleted

## 2017-03-15 NOTE — Telephone Encounter (Signed)
Patient's son [name] called stating patient is in a assistant living facility in [place] currently, and patient is complaining of having a lot of nausea per [name]. Roger request that Dr Juleen China can send something in to [facility] in [place] for patient. Fax number to Christella Noa is [phone].  Any questions please contact [name] [phone]

## 2017-03-15 NOTE — Telephone Encounter (Signed)
OK thank you 

## 2017-03-15 NOTE — Telephone Encounter (Signed)
Spoke with son and patient has been to ED twice over the last couple of weeks secondary to elevated blood pressure. Blood pressure is now 140's/80's-90. She does have follow with PCP next week and son with be out of town following week. Appointment 04/12/17 with Dr Oval Linsey. Offered appointment with PA/NP but son preferred waiting and seeing PCP next week and keeping f/u with Dr Oval Linsey since blood pressure improving. Advised son to call back if anything changes.

## 2017-03-15 NOTE — Telephone Encounter (Signed)
Pt c/o BP issue: STAT if pt c/o blurred vision, one-sided weakness or slurred speech  1. What are your last 5 BP readings? 225/117   155/90  2. Are you having any other symptoms (ex. Dizziness, headache, blurred vision, passed out)? Headaches dizziness  and vision  3. What is your BP issue? High   Pt son called to get pt another appt but he declined appt with PA for sooner appt and then he spoke about pt bp

## 2017-03-15 NOTE — Telephone Encounter (Signed)
Please advise 

## 2017-03-15 NOTE — Telephone Encounter (Signed)
I haven't seen this patient since 10/26/16.

## 2017-03-16 NOTE — Telephone Encounter (Signed)
She has to be seen. Nausea is a symptom of something going on.

## 2017-03-16 NOTE — Telephone Encounter (Signed)
Spoke with patient's son and explained that we have not seen the patient since May. He stated that he works out of town and can not get the patient here until Monday. He stated that he has been talking with the facility that his mother is in and they said the doctor is only in on Mondays. I explained that [name] would have to see the patient first. He ask that [name] please send in something to last until she can get here. I have scheduled patient for Monday morning.

## 2017-03-17 ENCOUNTER — Telehealth: Payer: Self-pay | Admitting: Family Medicine

## 2017-03-17 NOTE — Telephone Encounter (Signed)
[name] from [facility] needs resumption of care orders for PT and OT for the patient. The patient was discharged from the hospital on 10/13. Call the number provided to give orders.

## 2017-03-17 NOTE — Telephone Encounter (Signed)
Left detailed message on sons voicemail that we would not be able to prescribe the medicine for nausea until patient has been seen. I had already scheduled the patient for Monday. I also told him on the message that the [place] office does see acute visits on Saturday morning.

## 2017-03-17 NOTE — Telephone Encounter (Signed)
Spoke with [employer]. Verbal orders given.

## 2017-03-20 ENCOUNTER — Encounter: Payer: Self-pay | Admitting: Family Medicine

## 2017-03-20 ENCOUNTER — Ambulatory Visit (INDEPENDENT_AMBULATORY_CARE_PROVIDER_SITE_OTHER): Payer: Medicare Other | Admitting: Family Medicine

## 2017-03-20 VITALS — BP 138/72 | HR 74 | Temp 98.1°F | Wt 148.0 lb

## 2017-03-20 DIAGNOSIS — E039 Hypothyroidism, unspecified: Secondary | ICD-10-CM

## 2017-03-20 DIAGNOSIS — F4323 Adjustment disorder with mixed anxiety and depressed mood: Secondary | ICD-10-CM

## 2017-03-20 DIAGNOSIS — I10 Essential (primary) hypertension: Secondary | ICD-10-CM | POA: Diagnosis not present

## 2017-03-20 DIAGNOSIS — M79605 Pain in left leg: Secondary | ICD-10-CM | POA: Diagnosis not present

## 2017-03-20 DIAGNOSIS — E538 Deficiency of other specified B group vitamins: Secondary | ICD-10-CM | POA: Diagnosis not present

## 2017-03-20 DIAGNOSIS — M79604 Pain in right leg: Secondary | ICD-10-CM

## 2017-03-20 DIAGNOSIS — R945 Abnormal results of liver function studies: Secondary | ICD-10-CM

## 2017-03-20 DIAGNOSIS — N3000 Acute cystitis without hematuria: Secondary | ICD-10-CM

## 2017-03-20 DIAGNOSIS — F419 Anxiety disorder, unspecified: Secondary | ICD-10-CM | POA: Diagnosis not present

## 2017-03-20 DIAGNOSIS — S32591D Other specified fracture of right pubis, subsequent encounter for fracture with routine healing: Secondary | ICD-10-CM

## 2017-03-20 DIAGNOSIS — R110 Nausea: Secondary | ICD-10-CM

## 2017-03-20 DIAGNOSIS — R7989 Other specified abnormal findings of blood chemistry: Secondary | ICD-10-CM

## 2017-03-20 LAB — COMPREHENSIVE METABOLIC PANEL
ALT: 62 U/L — ABNORMAL HIGH (ref 0–35)
AST: 60 U/L — ABNORMAL HIGH (ref 0–37)
Albumin: 4.2 g/dL (ref 3.5–5.2)
Alkaline Phosphatase: 143 U/L — ABNORMAL HIGH (ref 39–117)
BUN: 11 mg/dL (ref 6–23)
CO2: 29 mEq/L (ref 19–32)
Calcium: 9.2 mg/dL (ref 8.4–10.5)
Chloride: 100 mEq/L (ref 96–112)
Creatinine, Ser: 0.61 mg/dL (ref 0.40–1.20)
GFR: 100.94 mL/min (ref >60.00)
Glucose, Bld: 88 mg/dL (ref 70–99)
Potassium: 3.9 mEq/L (ref 3.5–5.1)
Sodium: 139 mEq/L (ref 135–145)
Total Bilirubin: 0.5 mg/dL (ref 0.2–1.2)
Total Protein: 7.2 g/dL (ref 6.0–8.3)

## 2017-03-20 LAB — CBC WITH DIFFERENTIAL/PLATELET
Basophils Absolute: 0.1 10*3/uL (ref 0.0–0.1)
Basophils Relative: 0.8 % (ref 0.0–3.0)
Eosinophils Absolute: 0.2 10*3/uL (ref 0.0–0.7)
Eosinophils Relative: 3.2 % (ref 0.0–5.0)
HCT: 41 % (ref 36.0–46.0)
Hemoglobin: 13.6 g/dL (ref 12.0–15.0)
Lymphocytes Relative: 21.9 % (ref 12.0–46.0)
Lymphs Abs: 1.7 10*3/uL (ref 0.7–4.0)
MCHC: 33.3 g/dL (ref 30.0–36.0)
MCV: 92.3 fl (ref 78.0–100.0)
Monocytes Absolute: 0.5 10*3/uL (ref 0.1–1.0)
Monocytes Relative: 7.2 % (ref 3.0–12.0)
Neutro Abs: 5.1 10*3/uL (ref 1.4–7.7)
Neutrophils Relative %: 66.9 % (ref 43.0–77.0)
Platelets: 175 10*3/uL (ref 150.0–400.0)
RBC: 4.44 Mil/uL (ref 3.87–5.11)
RDW: 15.5 % (ref 11.5–15.5)
WBC: 7.6 10*3/uL (ref 4.0–10.5)

## 2017-03-20 LAB — URINALYSIS, ROUTINE W REFLEX MICROSCOPIC
Bilirubin Urine: NEGATIVE
Hgb urine dipstick: NEGATIVE
Ketones, ur: NEGATIVE
Leukocytes, UA: NEGATIVE
Nitrite: NEGATIVE
Specific Gravity, Urine: <=1.005 — AB (ref 1.000–1.030)
Total Protein, Urine: NEGATIVE
Urine Glucose: NEGATIVE
Urobilinogen, UA: 0.2 (ref 0.0–1.0)
pH: 6 (ref 5.0–8.0)

## 2017-03-20 LAB — T4, FREE: Free T4: 0.77 ng/dL (ref 0.60–1.60)

## 2017-03-20 LAB — VITAMIN B12: Vitamin B-12: >1500 pg/mL — ABNORMAL HIGH (ref 211–911)

## 2017-03-20 LAB — TSH: TSH: 0.99 u[IU]/mL (ref 0.35–4.50)

## 2017-03-20 MED ORDER — FLUOXETINE HCL 20 MG PO CAPS: 20.0000 mg | ORAL_CAPSULE | Freq: Every morning | ORAL | 3 refills | Status: DC

## 2017-03-20 MED ORDER — ONDANSETRON 4 MG PO TBDP: 4.0000 mg | ORAL_TABLET | Freq: Three times a day (TID) | ORAL | 0 refills | Status: DC | PRN

## 2017-03-20 NOTE — Patient Instructions (Signed)
We will fax your Provider Form to [facility] today.  I am ordering PT.

## 2017-03-21 ENCOUNTER — Telehealth: Payer: Self-pay | Admitting: Family Medicine

## 2017-03-21 DIAGNOSIS — F331 Major depressive disorder, recurrent, moderate: Secondary | ICD-10-CM | POA: Diagnosis not present

## 2017-03-21 DIAGNOSIS — G629 Polyneuropathy, unspecified: Secondary | ICD-10-CM | POA: Diagnosis not present

## 2017-03-21 DIAGNOSIS — S5291XD Unspecified fracture of right forearm, subsequent encounter for closed fracture with routine healing: Secondary | ICD-10-CM | POA: Diagnosis not present

## 2017-03-21 DIAGNOSIS — S32501D Unspecified fracture of right pubis, subsequent encounter for fracture with routine healing: Secondary | ICD-10-CM | POA: Diagnosis not present

## 2017-03-21 DIAGNOSIS — S3210XD Unspecified fracture of sacrum, subsequent encounter for fracture with routine healing: Secondary | ICD-10-CM | POA: Diagnosis not present

## 2017-03-21 DIAGNOSIS — W19XXXD Unspecified fall, subsequent encounter: Secondary | ICD-10-CM | POA: Diagnosis not present

## 2017-03-21 LAB — URINE CULTURE
MICRO NUMBER:: 81178364
Result:: NO GROWTH
SPECIMEN QUALITY:: ADEQUATE

## 2017-03-21 NOTE — Telephone Encounter (Signed)
Left message to return call 

## 2017-03-21 NOTE — Telephone Encounter (Signed)
[name] from [facility] called to get a verbal order for OT for 2x a week for 3 weeks. Call to advise.

## 2017-03-22 ENCOUNTER — Other Ambulatory Visit: Payer: Self-pay

## 2017-03-22 DIAGNOSIS — E559 Vitamin D deficiency, unspecified: Secondary | ICD-10-CM | POA: Diagnosis not present

## 2017-03-22 DIAGNOSIS — E038 Other specified hypothyroidism: Secondary | ICD-10-CM | POA: Diagnosis not present

## 2017-03-22 DIAGNOSIS — D518 Other vitamin B12 deficiency anemias: Secondary | ICD-10-CM | POA: Diagnosis not present

## 2017-03-22 DIAGNOSIS — E119 Type 2 diabetes mellitus without complications: Secondary | ICD-10-CM | POA: Diagnosis not present

## 2017-03-22 DIAGNOSIS — R945 Abnormal results of liver function studies: Principal | ICD-10-CM

## 2017-03-22 DIAGNOSIS — R7989 Other specified abnormal findings of blood chemistry: Secondary | ICD-10-CM

## 2017-03-22 DIAGNOSIS — Z79899 Other long term (current) drug therapy: Secondary | ICD-10-CM | POA: Diagnosis not present

## 2017-03-22 DIAGNOSIS — E7849 Other hyperlipidemia: Secondary | ICD-10-CM | POA: Diagnosis not present

## 2017-03-22 NOTE — Telephone Encounter (Signed)
Paris from [facility] called to advise that there was not an order for OT attached to the coversheet that was faxed. Please resend with the OT order for 2x a week for 3 weeks. Send to [phone] (fax)

## 2017-03-22 NOTE — Telephone Encounter (Signed)
Voicemail not set up.

## 2017-03-22 NOTE — Telephone Encounter (Signed)
OT order was included on the paperwork that was faxed earlier.  Faxed paperwork again.

## 2017-03-22 NOTE — Progress Notes (Signed)
[name] is a 76 y.o. female is here for follow up.  History of Present Illness:   HPI: Patient has had multiple hospitalizations and specialist visits since our last meeting. She is now in [place]. We reviewed her medication changes and changes since May at length. See AP for Problem Based charting.   There are no preventive care reminders to display for this patient. Depression screen [facility] 2/9 09/15/2016 09/13/2016 07/31/2015  Decreased Interest 0 0 0  Down, Depressed, Hopeless 0 0 0  PHQ - 2 Score 0 0 0  Some recent data might be hidden   PMHx, SurgHx, SocialHx, FamHx, Medications, and Allergies were reviewed in the Visit Navigator and updated as appropriate.   Patient Active Problem List   Diagnosis Date Noted  . Fall at home, initial encounter 01/07/2017  . Sacral fracture, closed ([place]) 01/07/2017  . Back pain 01/07/2017  . Aftercare for healing traumatic fracture of radius, right, closed 12/15/2016  . Closed fracture of ramus of right pubis ([place]) 11/22/2016  . Fall 11/21/2016  . Open Colles' fracture of right radius 11/02/2016  . Pigmented villonodular synovitis of knee, left 03/22/2016  . Abnormal radionuclide bone scan 08/16/2015  . Ankle fracture, bimalleolar, closed 08/16/2015  . HLD (hyperlipidemia) 07/28/2015  . Fatty liver 09/09/2014  . Lumbago 07/07/2014  . Abnormality of gait 03/27/2014  . Memory loss 01/08/2014  . 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 Paresthesia of foot 06/06/2012  . 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 Colon polyps 06/05/2008  . Osteoarthritis 06/04/2008  . Chronic diarrhea 04/22/2008  . Hypothyroidism 02/26/2007  . Adjustment disorder with mixed anxiety and depressed mood 02/26/2007  . Unspecified glaucoma 02/26/2007  . Essential hypertension 02/26/2007  . Mitral valve disease 02/26/2007  .  Allergic rhinitis 02/26/2007  . GERD 02/26/2007  . Hiatal hernia 02/26/2007  . Osteoporosis 02/26/2007   Social History  Substance Use Topics  . Smoking status: Never Smoker  . Smokeless tobacco: Never Used  . Alcohol use No   Current Medications and Allergies:   .  acetaminophen (TYLENOL) 325 MG tablet, Take 650 mg by mouth every 6 (six) hours as needed for moderate pain., Disp: , Rfl:  .  amLODipine (NORVASC) 5 MG tablet, Take 5 mg by mouth daily., Disp: , Rfl:  .  aspirin 81 MG tablet, Take 81 mg by mouth daily., Disp: , Rfl:  .  atenolol (TENORMIN) 50 MG tablet, Take 1 tablet (50 mg total) by mouth every morning., Disp: 90 tablet, Rfl: 1 .  baclofen (LIORESAL) 10 MG tablet, Take 1 tablet (10 mg total) by mouth 3 (three) times daily as needed for muscle spasms., Disp: 9 tablet, Rfl: 0 .  busPIRone (BUSPAR) 15 MG tablet, Take 15 mg by mouth at bedtime., Disp: , Rfl:  .  Calcium Carbonate (CALCIUM 600 PO), Take 1 tablet by mouth daily., Disp: , Rfl:  .  carboxymethylcellulose (REFRESH TEARS) 0.5 % SOLN, Place 1 drop into both eyes 2 (two) times daily. , Disp: , Rfl:  .  cloNIDine (CATAPRES) 0.1 MG tablet, Take 0.1 mg by mouth every 4 (four) hours as needed., Disp: , Rfl:  .  DULoxetine (CYMBALTA) 30 MG capsule, Take 1 capsule (30 mg total) by mouth 2 (two) times daily., Disp: 180 capsule, Rfl: 3 .  fluticasone (FLONASE) 50 MCG/ACT nasal spray, Place 2 sprays into both nostrils daily., Disp:  16 g, Rfl: 6 .  latanoprost (XALATAN) 0.005 % ophthalmic solution, Place 1 drop into both eyes nightly., Disp: , Rfl:  .  levothyroxine (SYNTHROID, LEVOTHROID) 200 MCG tablet, Take 1 tablet (200 mcg total) by mouth daily before breakfast., Disp: , Rfl:  .  lipase/protease/amylase (CREON) 12000 UNITS CPEP capsule, Take 2 capsules (24,000 Units total) by mouth 3 (three) times daily before meals., Disp: 270 capsule, Rfl: 5 .  lisinopril (PRINIVIL,ZESTRIL) 20 MG tablet, Take 20 mg by mouth daily., Disp: ,  Rfl:  .  loperamide (IMODIUM A-D) 2 MG capsule, Take 2 mg by mouth as needed for diarrhea or loose stools., Disp: , Rfl:  .  lovastatin (MEVACOR) 40 MG tablet, TAKE 1 TABLET BY MOUTH AT  BEDTIME, Disp: 90 tablet, Rfl: 1 .  Multiple Vitamin (MULTIVITAMIN WITH MINERALS) TABS tablet, Take 1 tablet by mouth daily., Disp: , Rfl:  .  omeprazole (PRILOSEC OTC) 20 MG tablet, Take 20 mg by mouth 2 (two) times daily. , Disp: , Rfl:  .  oxyCODONE (OXY IR/ROXICODONE) 5 MG immediate release tablet, Take 5 mg by mouth as directed. TAKE 1 AND 1/2 TABLET BY MOUTH 4 TIMES A DAY AS NEEDED FOR SEVERE PAIN, Disp: , Rfl:  .  polycarbophil (FIBERCON) 625 MG tablet, Take 625 mg by mouth 4 (four) times daily. , Disp: , Rfl:  .  polyethylene glycol (MIRALAX / GLYCOLAX) packet, Take 17 g by mouth daily as needed for mild constipation., Disp: 14 each, Rfl: 0 .  pregabalin (LYRICA) 200 MG capsule, Take 200 mg by mouth 2 (two) times daily., Disp: , Rfl:  .  Probiotic Product (PROBIOTIC-10 PO), Take 1 tablet by mouth 2 (two) times daily., Disp: , Rfl:  .  psyllium (REGULOID) 0.52 g capsule, Take 0.52 g by mouth daily., Disp: , Rfl:  .  rOPINIRole (REQUIP) 0.5 MG tablet, Take 1 tablet (0.5 mg total) by mouth at bedtime., Disp: 30 tablet, Rfl: 6 .  simethicone (MYLICON) 678 MG chewable tablet, Chew 125 mg by mouth every 6 (six) hours as needed for flatulence., Disp: , Rfl:  .  vitamin B-12 (CYANOCOBALAMIN) 1000 MCG tablet, Take 2,000 mcg by mouth daily., Disp: , Rfl:  .  Vitamin D, Ergocalciferol, (DRISDOL) 50000 units CAPS capsule, Take 1 capsule (50,000 Units total) by mouth every 7 (seven) days. (Patient taking differently: Take 50,000 Units by mouth every 7 (seven) days. On Wednesdays), Disp: 10 capsule, Rfl: 0 .  vitamin E 400 UNIT capsule, Take 800 Units by mouth 2 (two) times daily. For tartive dyskinesia, Disp: , Rfl:   Allergies  Allergen Reactions  . Other Other (See Comments)    According to patient Trilafor and  Reglan cause Tardive Dyskinesia  . Metoclopramide Hcl Other (See Comments)    Shaking; caused tardive dyskinesia  . Niacin Other (See Comments)    shaking  . Trovan [Alatrofloxacin Mesylate] Other (See Comments)    Caused shaking and nervousness  . Benzocaine-Resorcinol Rash  . Celecoxib Other (See Comments)    unknown  . Erythromycin Base Rash  . Glucosamine Other (See Comments)    unknown  . Nortriptyline Other (See Comments)    Dizziness   . Phenazopyridine Hcl Other (See Comments)    unknown  . Sulfa Antibiotics Nausea And Vomiting and Rash  . Sulfonamide Derivatives Nausea And Vomiting and Rash   Review of Systems   Pertinent items are noted in the HPI. Otherwise, ROS is negative.  Vitals:   Vitals:  03/20/17 1004  BP: 138/72  Pulse: 74  Temp: 98.1 F (36.7 C)  TempSrc: Oral  SpO2: 94%  Weight: 148 lb (67.1 kg)     Body mass index is 27.96 kg/m.   Physical Exam:   Physical Exam  Constitutional: She appears well-nourished.  HENT:  Head: Normocephalic and atraumatic.  Eyes: Pupils are equal, round, and reactive to light. EOM are normal.  Neck: Normal range of motion. Neck supple.  Cardiovascular: Normal rate, regular rhythm, normal heart sounds and intact distal pulses.   Pulmonary/Chest: Effort normal.  Abdominal: Soft.  Skin: Skin is warm.  Psychiatric: She has a normal mood and affect. Her behavior is normal.  Nursing note and vitals reviewed.   Results for orders placed or performed in visit on 03/20/17  Urine Culture  Result Value Ref Range   MICRO NUMBER: [id number]    SPECIMEN QUALITY: ADEQUATE    Sample Source NOT GIVEN    STATUS: FINAL    Result: No Growth   CBC with Differential/Platelet  Result Value Ref Range   WBC 7.6 4.0 - 10.5 K/uL   RBC 4.44 3.87 - 5.11 Mil/uL   Hemoglobin 13.6 12.0 - 15.0 g/dL   HCT 41.0 36.0 - 46.0 %   MCV 92.3 78.0 - 100.0 fl   MCHC 33.3 30.0 - 36.0 g/dL   RDW 15.5 11.5 - 15.5 %   Platelets 175.0 150.0 -  400.0 K/uL   Neutrophils Relative % 66.9 43.0 - 77.0 %   Lymphocytes Relative 21.9 12.0 - 46.0 %   Monocytes Relative 7.2 3.0 - 12.0 %   Eosinophils Relative 3.2 0.0 - 5.0 %   Basophils Relative 0.8 0.0 - 3.0 %   Neutro Abs 5.1 1.4 - 7.7 K/uL   Lymphs Abs 1.7 0.7 - 4.0 K/uL   Monocytes Absolute 0.5 0.1 - 1.0 K/uL   Eosinophils Absolute 0.2 0.0 - 0.7 K/uL   Basophils Absolute 0.1 0.0 - 0.1 K/uL  Comprehensive metabolic panel  Result Value Ref Range   Sodium 139 135 - 145 mEq/L   Potassium 3.9 3.5 - 5.1 mEq/L   Chloride 100 96 - 112 mEq/L   CO2 29 19 - 32 mEq/L   Glucose, Bld 88 70 - 99 mg/dL   BUN 11 6 - 23 mg/dL   Creatinine, Ser 0.61 0.40 - 1.20 mg/dL   Total Bilirubin 0.5 0.2 - 1.2 mg/dL   Alkaline Phosphatase 143 (H) 39 - 117 U/L   AST 60 (H) 0 - 37 U/L   ALT 62 (H) 0 - 35 U/L   Total Protein 7.2 6.0 - 8.3 g/dL   Albumin 4.2 3.5 - 5.2 g/dL   Calcium 9.2 8.4 - 10.5 mg/dL   GFR 100.94 >60.00 mL/min  TSH  Result Value Ref Range   TSH 0.99 0.35 - 4.50 uIU/mL  T4, free  Result Value Ref Range   Free T4 0.77 0.60 - 1.60 ng/dL  Vitamin B12  Result Value Ref Range   Vitamin B-12 >1500 (H) 211 - 911 pg/mL  Urinalysis, Routine w reflex microscopic  Result Value Ref Range   Color, Urine YELLOW Yellow;Lt. Yellow   APPearance CLEAR Clear   Specific Gravity, Urine <=1.005 (A) 1.000 - 1.030   pH 6.0 5.0 - 8.0   Total Protein, Urine NEGATIVE Negative   Urine Glucose NEGATIVE Negative   Ketones, ur NEGATIVE Negative   Bilirubin Urine NEGATIVE Negative   Hgb urine dipstick NEGATIVE Negative   Urobilinogen, UA 0.2 0.0 -  1.0   Leukocytes, UA NEGATIVE Negative   Nitrite NEGATIVE Negative   *Note: Due to a large number of results and/or encounters for the requested time period, some results have not been displayed. A complete set of results can be found in Results Review.   Assessment and Plan:   Abisai was seen today for follow-up.  Diagnoses and all orders for this  visit:  Adjustment disorder with mixed anxiety and depressed mood Comments: Uncontrolled. On Cymbalta and Buspar. Previous oversedation with Prozac and Ativan on MAR. Will send to [facility].  Acute cystitis without hematuria Comments: Resolved. Urine culture now without growth.  Orders: -     CBC with Differential/Platelet -     Urine Culture -     Urinalysis, Routine w reflex microscopic  Essential hypertension Comments: At goal today. Noted that Norvasc and Lisinopril on MAR.  Orders: -     Comprehensive metabolic panel  Hypothyroidism, unspecified type Comments:  Lab Results  Component Value Date   TSH 0.99 03/20/2017   Orders: -     TSH -     T4, free  Pain in both lower extremities Comments: B12 WNL. Orders: -     Vitamin B12  Nausea Comments: Intermittent. Okay Zofran prn. Orders: -     ondansetron (ZOFRAN ODT) 4 MG disintegrating tablet; Take 1 tablet (4 mg total) by mouth every 8 (eight) hours as needed for nausea or vomiting.  Closed fracture of ramus of right pubis with routine healing, subsequent encounter Comments: Will order PT to continue.  Elevated LFTs Comments: NEW. Associated with some nausea. Creon dose time adjusted. Recheck CMP in 3 weeks.  Lab Results  Component Value Date   ALT 62 (H) 03/20/2017   AST 60 (H) 03/20/2017   ALKPHOS 143 (H) 03/20/2017   BILITOT 0.5 03/20/2017   . Reviewed expectations re: course of current medical issues. . Discussed self-management of symptoms. . Outlined signs and symptoms indicating need for more acute intervention. . Patient verbalized understanding and all questions were answered. Marland Kitchen Health Maintenance issues including appropriate healthy diet, exercise, and smoking avoidance were discussed with patient. . See orders for this visit as documented in the electronic medical record. . Patient received an After Visit Summary.  Records requested if needed. Time spent with the patient: 60 minutes, of  which >50% was spent in obtaining information about her symptoms, reviewing her previous labs, evaluations, and treatments, counseling her about her condition (please see the discussed topics above), and developing a plan to further investigate it; she had a number of questions which I addressed.   Briscoe Deutscher, DO [place] [place] 03/22/2017  Future Appointments Date Time Provider [place]  04/12/2017 2:00 PM Skeet Latch, MD [facility]  09/18/2017 9:00 AM Williemae Area, RN [facility] None  09/28/2017 11:30 AM Ladell Pier, MD [facility] None

## 2017-03-22 NOTE — Telephone Encounter (Signed)
Amber faxed orders over for OT.

## 2017-03-23 ENCOUNTER — Inpatient Hospital Stay: Payer: Medicare Other | Admitting: Family Medicine

## 2017-03-23 DIAGNOSIS — S32501D Unspecified fracture of right pubis, subsequent encounter for fracture with routine healing: Secondary | ICD-10-CM | POA: Diagnosis not present

## 2017-03-23 DIAGNOSIS — R05 Cough: Secondary | ICD-10-CM | POA: Diagnosis not present

## 2017-03-23 DIAGNOSIS — S3210XD Unspecified fracture of sacrum, subsequent encounter for fracture with routine healing: Secondary | ICD-10-CM | POA: Diagnosis not present

## 2017-03-23 DIAGNOSIS — W19XXXD Unspecified fall, subsequent encounter: Secondary | ICD-10-CM | POA: Diagnosis not present

## 2017-03-23 DIAGNOSIS — F331 Major depressive disorder, recurrent, moderate: Secondary | ICD-10-CM | POA: Diagnosis not present

## 2017-03-23 DIAGNOSIS — S5291XD Unspecified fracture of right forearm, subsequent encounter for closed fracture with routine healing: Secondary | ICD-10-CM | POA: Diagnosis not present

## 2017-03-23 DIAGNOSIS — G629 Polyneuropathy, unspecified: Secondary | ICD-10-CM | POA: Diagnosis not present

## 2017-03-24 ENCOUNTER — Inpatient Hospital Stay: Payer: Medicare Other | Admitting: Family Medicine

## 2017-03-24 DIAGNOSIS — T43225A Adverse effect of selective serotonin reuptake inhibitors, initial encounter: Secondary | ICD-10-CM | POA: Diagnosis not present

## 2017-03-24 DIAGNOSIS — F411 Generalized anxiety disorder: Secondary | ICD-10-CM | POA: Diagnosis not present

## 2017-03-24 DIAGNOSIS — F331 Major depressive disorder, recurrent, moderate: Secondary | ICD-10-CM | POA: Diagnosis not present

## 2017-03-25 DIAGNOSIS — S3210XD Unspecified fracture of sacrum, subsequent encounter for fracture with routine healing: Secondary | ICD-10-CM | POA: Diagnosis not present

## 2017-03-25 DIAGNOSIS — W19XXXD Unspecified fall, subsequent encounter: Secondary | ICD-10-CM | POA: Diagnosis not present

## 2017-03-25 DIAGNOSIS — G629 Polyneuropathy, unspecified: Secondary | ICD-10-CM | POA: Diagnosis not present

## 2017-03-25 DIAGNOSIS — F331 Major depressive disorder, recurrent, moderate: Secondary | ICD-10-CM | POA: Diagnosis not present

## 2017-03-25 DIAGNOSIS — S32501D Unspecified fracture of right pubis, subsequent encounter for fracture with routine healing: Secondary | ICD-10-CM | POA: Diagnosis not present

## 2017-03-25 DIAGNOSIS — S5291XD Unspecified fracture of right forearm, subsequent encounter for closed fracture with routine healing: Secondary | ICD-10-CM | POA: Diagnosis not present

## 2017-03-27 DIAGNOSIS — G629 Polyneuropathy, unspecified: Secondary | ICD-10-CM | POA: Diagnosis not present

## 2017-03-27 DIAGNOSIS — I10 Essential (primary) hypertension: Secondary | ICD-10-CM | POA: Diagnosis not present

## 2017-03-27 DIAGNOSIS — J069 Acute upper respiratory infection, unspecified: Secondary | ICD-10-CM | POA: Diagnosis not present

## 2017-03-27 DIAGNOSIS — R509 Fever, unspecified: Secondary | ICD-10-CM | POA: Diagnosis not present

## 2017-03-27 DIAGNOSIS — S32501D Unspecified fracture of right pubis, subsequent encounter for fracture with routine healing: Secondary | ICD-10-CM | POA: Diagnosis not present

## 2017-03-27 DIAGNOSIS — F331 Major depressive disorder, recurrent, moderate: Secondary | ICD-10-CM | POA: Diagnosis not present

## 2017-03-27 DIAGNOSIS — S5291XD Unspecified fracture of right forearm, subsequent encounter for closed fracture with routine healing: Secondary | ICD-10-CM | POA: Diagnosis not present

## 2017-03-27 DIAGNOSIS — S3210XD Unspecified fracture of sacrum, subsequent encounter for fracture with routine healing: Secondary | ICD-10-CM | POA: Diagnosis not present

## 2017-03-27 DIAGNOSIS — W19XXXD Unspecified fall, subsequent encounter: Secondary | ICD-10-CM | POA: Diagnosis not present

## 2017-03-28 DIAGNOSIS — G629 Polyneuropathy, unspecified: Secondary | ICD-10-CM | POA: Diagnosis not present

## 2017-03-28 DIAGNOSIS — S32501D Unspecified fracture of right pubis, subsequent encounter for fracture with routine healing: Secondary | ICD-10-CM | POA: Diagnosis not present

## 2017-03-28 DIAGNOSIS — S3210XD Unspecified fracture of sacrum, subsequent encounter for fracture with routine healing: Secondary | ICD-10-CM | POA: Diagnosis not present

## 2017-03-28 DIAGNOSIS — F331 Major depressive disorder, recurrent, moderate: Secondary | ICD-10-CM | POA: Diagnosis not present

## 2017-03-28 DIAGNOSIS — S5291XD Unspecified fracture of right forearm, subsequent encounter for closed fracture with routine healing: Secondary | ICD-10-CM | POA: Diagnosis not present

## 2017-03-28 DIAGNOSIS — W19XXXD Unspecified fall, subsequent encounter: Secondary | ICD-10-CM | POA: Diagnosis not present

## 2017-03-29 DIAGNOSIS — F331 Major depressive disorder, recurrent, moderate: Secondary | ICD-10-CM | POA: Diagnosis not present

## 2017-03-29 DIAGNOSIS — W19XXXD Unspecified fall, subsequent encounter: Secondary | ICD-10-CM | POA: Diagnosis not present

## 2017-03-29 DIAGNOSIS — S5291XD Unspecified fracture of right forearm, subsequent encounter for closed fracture with routine healing: Secondary | ICD-10-CM | POA: Diagnosis not present

## 2017-03-29 DIAGNOSIS — S3210XD Unspecified fracture of sacrum, subsequent encounter for fracture with routine healing: Secondary | ICD-10-CM | POA: Diagnosis not present

## 2017-03-29 DIAGNOSIS — S32501D Unspecified fracture of right pubis, subsequent encounter for fracture with routine healing: Secondary | ICD-10-CM | POA: Diagnosis not present

## 2017-03-29 DIAGNOSIS — G629 Polyneuropathy, unspecified: Secondary | ICD-10-CM | POA: Diagnosis not present

## 2017-03-30 DIAGNOSIS — S32501D Unspecified fracture of right pubis, subsequent encounter for fracture with routine healing: Secondary | ICD-10-CM | POA: Diagnosis not present

## 2017-03-30 DIAGNOSIS — S5291XD Unspecified fracture of right forearm, subsequent encounter for closed fracture with routine healing: Secondary | ICD-10-CM | POA: Diagnosis not present

## 2017-03-30 DIAGNOSIS — F331 Major depressive disorder, recurrent, moderate: Secondary | ICD-10-CM | POA: Diagnosis not present

## 2017-03-30 DIAGNOSIS — S3210XD Unspecified fracture of sacrum, subsequent encounter for fracture with routine healing: Secondary | ICD-10-CM | POA: Diagnosis not present

## 2017-03-30 DIAGNOSIS — W19XXXD Unspecified fall, subsequent encounter: Secondary | ICD-10-CM | POA: Diagnosis not present

## 2017-03-30 DIAGNOSIS — G629 Polyneuropathy, unspecified: Secondary | ICD-10-CM | POA: Diagnosis not present

## 2017-04-03 DIAGNOSIS — F331 Major depressive disorder, recurrent, moderate: Secondary | ICD-10-CM | POA: Diagnosis not present

## 2017-04-03 DIAGNOSIS — W19XXXD Unspecified fall, subsequent encounter: Secondary | ICD-10-CM | POA: Diagnosis not present

## 2017-04-03 DIAGNOSIS — S5291XD Unspecified fracture of right forearm, subsequent encounter for closed fracture with routine healing: Secondary | ICD-10-CM | POA: Diagnosis not present

## 2017-04-03 DIAGNOSIS — S32501D Unspecified fracture of right pubis, subsequent encounter for fracture with routine healing: Secondary | ICD-10-CM | POA: Diagnosis not present

## 2017-04-03 DIAGNOSIS — G629 Polyneuropathy, unspecified: Secondary | ICD-10-CM | POA: Diagnosis not present

## 2017-04-03 DIAGNOSIS — S3210XD Unspecified fracture of sacrum, subsequent encounter for fracture with routine healing: Secondary | ICD-10-CM | POA: Diagnosis not present

## 2017-04-03 NOTE — Telephone Encounter (Signed)
OT orders never received.  Please refax to Fax: [phone] ATTN:Lottie  Ty,  -LL

## 2017-04-03 NOTE — Telephone Encounter (Signed)
Have tried to call Paris again and voice mail not set up. Amber do you still have these forms to refax?

## 2017-04-03 NOTE — Telephone Encounter (Signed)
I have faxed the paperwork again to the number provided.

## 2017-04-04 DIAGNOSIS — W19XXXD Unspecified fall, subsequent encounter: Secondary | ICD-10-CM | POA: Diagnosis not present

## 2017-04-04 DIAGNOSIS — S5291XD Unspecified fracture of right forearm, subsequent encounter for closed fracture with routine healing: Secondary | ICD-10-CM | POA: Diagnosis not present

## 2017-04-04 DIAGNOSIS — F411 Generalized anxiety disorder: Secondary | ICD-10-CM | POA: Diagnosis not present

## 2017-04-04 DIAGNOSIS — G629 Polyneuropathy, unspecified: Secondary | ICD-10-CM | POA: Diagnosis not present

## 2017-04-04 DIAGNOSIS — S32501D Unspecified fracture of right pubis, subsequent encounter for fracture with routine healing: Secondary | ICD-10-CM | POA: Diagnosis not present

## 2017-04-04 DIAGNOSIS — F331 Major depressive disorder, recurrent, moderate: Secondary | ICD-10-CM | POA: Diagnosis not present

## 2017-04-04 DIAGNOSIS — S3210XD Unspecified fracture of sacrum, subsequent encounter for fracture with routine healing: Secondary | ICD-10-CM | POA: Diagnosis not present

## 2017-04-05 DIAGNOSIS — F331 Major depressive disorder, recurrent, moderate: Secondary | ICD-10-CM | POA: Diagnosis not present

## 2017-04-05 DIAGNOSIS — S32501D Unspecified fracture of right pubis, subsequent encounter for fracture with routine healing: Secondary | ICD-10-CM | POA: Diagnosis not present

## 2017-04-05 DIAGNOSIS — G629 Polyneuropathy, unspecified: Secondary | ICD-10-CM | POA: Diagnosis not present

## 2017-04-05 DIAGNOSIS — S5291XD Unspecified fracture of right forearm, subsequent encounter for closed fracture with routine healing: Secondary | ICD-10-CM | POA: Diagnosis not present

## 2017-04-05 DIAGNOSIS — S3210XD Unspecified fracture of sacrum, subsequent encounter for fracture with routine healing: Secondary | ICD-10-CM | POA: Diagnosis not present

## 2017-04-05 DIAGNOSIS — W19XXXD Unspecified fall, subsequent encounter: Secondary | ICD-10-CM | POA: Diagnosis not present

## 2017-04-06 ENCOUNTER — Ambulatory Visit: Payer: Medicare Other | Admitting: Cardiovascular Disease

## 2017-04-06 DIAGNOSIS — G629 Polyneuropathy, unspecified: Secondary | ICD-10-CM | POA: Diagnosis not present

## 2017-04-06 DIAGNOSIS — S32501D Unspecified fracture of right pubis, subsequent encounter for fracture with routine healing: Secondary | ICD-10-CM | POA: Diagnosis not present

## 2017-04-06 DIAGNOSIS — W19XXXD Unspecified fall, subsequent encounter: Secondary | ICD-10-CM | POA: Diagnosis not present

## 2017-04-06 DIAGNOSIS — F331 Major depressive disorder, recurrent, moderate: Secondary | ICD-10-CM | POA: Diagnosis not present

## 2017-04-06 DIAGNOSIS — S5291XD Unspecified fracture of right forearm, subsequent encounter for closed fracture with routine healing: Secondary | ICD-10-CM | POA: Diagnosis not present

## 2017-04-06 DIAGNOSIS — S3210XD Unspecified fracture of sacrum, subsequent encounter for fracture with routine healing: Secondary | ICD-10-CM | POA: Diagnosis not present

## 2017-04-07 DIAGNOSIS — E7849 Other hyperlipidemia: Secondary | ICD-10-CM | POA: Diagnosis not present

## 2017-04-07 DIAGNOSIS — D518 Other vitamin B12 deficiency anemias: Secondary | ICD-10-CM | POA: Diagnosis not present

## 2017-04-07 DIAGNOSIS — E119 Type 2 diabetes mellitus without complications: Secondary | ICD-10-CM | POA: Diagnosis not present

## 2017-04-07 DIAGNOSIS — Z79899 Other long term (current) drug therapy: Secondary | ICD-10-CM | POA: Diagnosis not present

## 2017-04-10 DIAGNOSIS — S5291XD Unspecified fracture of right forearm, subsequent encounter for closed fracture with routine healing: Secondary | ICD-10-CM | POA: Diagnosis not present

## 2017-04-10 DIAGNOSIS — F331 Major depressive disorder, recurrent, moderate: Secondary | ICD-10-CM | POA: Diagnosis not present

## 2017-04-10 DIAGNOSIS — S32501D Unspecified fracture of right pubis, subsequent encounter for fracture with routine healing: Secondary | ICD-10-CM | POA: Diagnosis not present

## 2017-04-10 DIAGNOSIS — W19XXXD Unspecified fall, subsequent encounter: Secondary | ICD-10-CM | POA: Diagnosis not present

## 2017-04-10 DIAGNOSIS — S3210XD Unspecified fracture of sacrum, subsequent encounter for fracture with routine healing: Secondary | ICD-10-CM | POA: Diagnosis not present

## 2017-04-10 DIAGNOSIS — G629 Polyneuropathy, unspecified: Secondary | ICD-10-CM | POA: Diagnosis not present

## 2017-04-11 DIAGNOSIS — W19XXXD Unspecified fall, subsequent encounter: Secondary | ICD-10-CM | POA: Diagnosis not present

## 2017-04-11 DIAGNOSIS — S32501D Unspecified fracture of right pubis, subsequent encounter for fracture with routine healing: Secondary | ICD-10-CM | POA: Diagnosis not present

## 2017-04-11 DIAGNOSIS — G629 Polyneuropathy, unspecified: Secondary | ICD-10-CM | POA: Diagnosis not present

## 2017-04-11 DIAGNOSIS — S5291XD Unspecified fracture of right forearm, subsequent encounter for closed fracture with routine healing: Secondary | ICD-10-CM | POA: Diagnosis not present

## 2017-04-11 DIAGNOSIS — F331 Major depressive disorder, recurrent, moderate: Secondary | ICD-10-CM | POA: Diagnosis not present

## 2017-04-11 DIAGNOSIS — S3210XD Unspecified fracture of sacrum, subsequent encounter for fracture with routine healing: Secondary | ICD-10-CM | POA: Diagnosis not present

## 2017-04-12 ENCOUNTER — Ambulatory Visit (INDEPENDENT_AMBULATORY_CARE_PROVIDER_SITE_OTHER): Payer: Medicare Other | Admitting: Cardiovascular Disease

## 2017-04-12 ENCOUNTER — Encounter: Payer: Self-pay | Admitting: Cardiovascular Disease

## 2017-04-12 VITALS — BP 155/73 | HR 75 | Ht 61.0 in | Wt 147.4 lb

## 2017-04-12 DIAGNOSIS — R600 Localized edema: Secondary | ICD-10-CM | POA: Diagnosis not present

## 2017-04-12 DIAGNOSIS — W19XXXD Unspecified fall, subsequent encounter: Secondary | ICD-10-CM | POA: Diagnosis not present

## 2017-04-12 DIAGNOSIS — S32501D Unspecified fracture of right pubis, subsequent encounter for fracture with routine healing: Secondary | ICD-10-CM | POA: Diagnosis not present

## 2017-04-12 DIAGNOSIS — S5291XD Unspecified fracture of right forearm, subsequent encounter for closed fracture with routine healing: Secondary | ICD-10-CM | POA: Diagnosis not present

## 2017-04-12 DIAGNOSIS — E785 Hyperlipidemia, unspecified: Secondary | ICD-10-CM

## 2017-04-12 DIAGNOSIS — I10 Essential (primary) hypertension: Secondary | ICD-10-CM | POA: Diagnosis not present

## 2017-04-12 DIAGNOSIS — F331 Major depressive disorder, recurrent, moderate: Secondary | ICD-10-CM | POA: Diagnosis not present

## 2017-04-12 DIAGNOSIS — I119 Hypertensive heart disease without heart failure: Secondary | ICD-10-CM | POA: Diagnosis not present

## 2017-04-12 DIAGNOSIS — G629 Polyneuropathy, unspecified: Secondary | ICD-10-CM | POA: Diagnosis not present

## 2017-04-12 DIAGNOSIS — S3210XD Unspecified fracture of sacrum, subsequent encounter for fracture with routine healing: Secondary | ICD-10-CM | POA: Diagnosis not present
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
## 2017-04-12 NOTE — Patient Instructions (Signed)
Medication Instructions:  INCREASE YOUR ATENOLOL TO 25 MG IN THE MORNING AND 50 MG IN THE EVENING   Labwork: 24 HOUR URINE   Testing/Procedures: Your physician has requested that you have a renal artery duplex. During this test, an ultrasound is used to evaluate blood flow to the kidneys. Allow one hour for this exam. Do not eat after midnight the day before and avoid carbonated beverages. Take your medications as you usually do.  Follow-Up: Your physician recommends that you schedule a follow-up appointment in: [place]  If you need a refill on your cardiac medications before your next appointment, please call your pharmacy.

## 2017-04-13 ENCOUNTER — Encounter: Payer: Self-pay | Admitting: Cardiovascular Disease

## 2017-04-13 DIAGNOSIS — S3210XD Unspecified fracture of sacrum, subsequent encounter for fracture with routine healing: Secondary | ICD-10-CM | POA: Diagnosis not present

## 2017-04-13 DIAGNOSIS — E038 Other specified hypothyroidism: Secondary | ICD-10-CM | POA: Diagnosis not present

## 2017-04-13 DIAGNOSIS — F331 Major depressive disorder, recurrent, moderate: Secondary | ICD-10-CM | POA: Diagnosis not present

## 2017-04-13 DIAGNOSIS — S32501D Unspecified fracture of right pubis, subsequent encounter for fracture with routine healing: Secondary | ICD-10-CM | POA: Diagnosis not present

## 2017-04-13 DIAGNOSIS — S5291XD Unspecified fracture of right forearm, subsequent encounter for closed fracture with routine healing: Secondary | ICD-10-CM | POA: Diagnosis not present

## 2017-04-13 DIAGNOSIS — G629 Polyneuropathy, unspecified: Secondary | ICD-10-CM | POA: Diagnosis not present

## 2017-04-13 DIAGNOSIS — W19XXXD Unspecified fall, subsequent encounter: Secondary | ICD-10-CM | POA: Diagnosis not present

## 2017-04-13 DIAGNOSIS — Z79899 Other long term (current) drug therapy: Secondary | ICD-10-CM | POA: Diagnosis not present

## 2017-04-17 DIAGNOSIS — I10 Essential (primary) hypertension: Secondary | ICD-10-CM | POA: Diagnosis not present

## 2017-04-22 LAB — CATECHOLAMINES, FRACTIONATED, URINE, 24 HOUR
DOPAMINE 24H UR: 253 ug/(24.h) (ref 0–510)
DOPAMINE RANDOM UR: 99 ug/L
Epinephrine, 24H Ur: <3 ug/24 hr (ref 0–20)
Epinephrine, Rand Ur: <1 ug/L
Norepinephrine, 24H Ur: 87 ug/24 hr (ref 0–135)
Norepinephrine, Rand Ur: 34 ug/L

## 2017-04-22 LAB — METANEPHRINES, URINE, 24 HOUR
METANEPH TOTAL UR: 29 ug/L
Metanephrines, 24H Ur: 74 ug/24 hr (ref 45–290)
NORMETANEPHRINE 24H UR: 576 ug/(24.h) — AB (ref 82–500)
Normetanephrine, Ur: 225 ug/L

## 2017-04-24 DIAGNOSIS — E785 Hyperlipidemia, unspecified: Secondary | ICD-10-CM | POA: Diagnosis not present

## 2017-04-24 DIAGNOSIS — S32501D Unspecified fracture of right pubis, subsequent encounter for fracture with routine healing: Secondary | ICD-10-CM | POA: Diagnosis not present

## 2017-04-24 DIAGNOSIS — I2510 Atherosclerotic heart disease of native coronary artery without angina pectoris: Secondary | ICD-10-CM | POA: Diagnosis not present

## 2017-04-24 DIAGNOSIS — S5291XD Unspecified fracture of right forearm, subsequent encounter for closed fracture with routine healing: Secondary | ICD-10-CM | POA: Diagnosis not present

## 2017-04-25 LAB — CATECHOLAMINES, FRACTIONATED, URINE, 24 HOUR

## 2017-04-25 LAB — METANEPHRINES, URINE, 24 HOUR

## 2017-05-01 ENCOUNTER — Ambulatory Visit (HOSPITAL_COMMUNITY)
Admission: RE | Admit: 2017-05-01 | Discharge: 2017-05-01 | Disposition: A | Discharge status: Home / Self Care | Payer: Medicare Other | Source: Ambulatory Visit | Attending: Cardiovascular Disease | Admitting: Cardiovascular Disease

## 2017-05-01 DIAGNOSIS — Z8507 Personal history of malignant neoplasm of pancreas: Secondary | ICD-10-CM | POA: Diagnosis not present

## 2017-05-01 DIAGNOSIS — I10 Essential (primary) hypertension: Secondary | ICD-10-CM | POA: Insufficient documentation

## 2017-05-01 DIAGNOSIS — K551 Chronic vascular disorders of intestine: Secondary | ICD-10-CM | POA: Insufficient documentation

## 2017-05-01 DIAGNOSIS — E785 Hyperlipidemia, unspecified: Secondary | ICD-10-CM | POA: Insufficient documentation

## 2017-05-09 ENCOUNTER — Ambulatory Visit: Payer: Medicare Other | Admitting: Family Medicine

## 2017-05-10 ENCOUNTER — Telehealth: Payer: Self-pay | Admitting: *Deleted

## 2017-05-10 ENCOUNTER — Ambulatory Visit (INDEPENDENT_AMBULATORY_CARE_PROVIDER_SITE_OTHER): Payer: Medicare Other | Admitting: Family Medicine

## 2017-05-10 ENCOUNTER — Encounter: Payer: Self-pay | Admitting: Family Medicine

## 2017-05-10 VITALS — BP 142/88 | HR 82 | Temp 98.5°F | Ht 61.0 in | Wt 147.0 lb

## 2017-05-10 DIAGNOSIS — I10 Essential (primary) hypertension: Secondary | ICD-10-CM | POA: Diagnosis not present

## 2017-05-10 DIAGNOSIS — Z79899 Other long term (current) drug therapy: Secondary | ICD-10-CM

## 2017-05-10 DIAGNOSIS — R51 Headache: Secondary | ICD-10-CM

## 2017-05-10 DIAGNOSIS — M79605 Pain in left leg: Secondary | ICD-10-CM

## 2017-05-10 DIAGNOSIS — I771 Stricture of artery: Principal | ICD-10-CM

## 2017-05-10 DIAGNOSIS — E538 Deficiency of other specified B group vitamins: Secondary | ICD-10-CM

## 2017-05-10 DIAGNOSIS — R5383 Other fatigue: Secondary | ICD-10-CM | POA: Diagnosis not present

## 2017-05-10 DIAGNOSIS — M79604 Pain in right leg: Secondary | ICD-10-CM | POA: Diagnosis not present

## 2017-05-10 DIAGNOSIS — K551 Chronic vascular disorders of intestine: Secondary | ICD-10-CM

## 2017-05-10 DIAGNOSIS — G4486 Cervicogenic headache: Secondary | ICD-10-CM

## 2017-05-10 NOTE — Addendum Note (Signed)
Addended by: Alvina Filbert B on: 05/10/2017 02:17 PM   Modules accepted: Orders

## 2017-05-10 NOTE — Telephone Encounter (Signed)
-----   Message from Skeet Latch, MD sent at 05/10/2017  8:34 AM EST ----- No renal artery stenosis noted.  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  Recommend referral to vascular surgery to evaluate it.

## 2017-05-10 NOTE — Progress Notes (Addendum)
[name] is a 76 y.o. female is here for follow up.  History of Present Illness:   Shaune Pascal, CMA, scribe for [name].   HPI:  Patient comes in today for multiple concerns. Patient lives independently at this time. We will order some home health through [facility]. Patients son stated that that she has meet her limits on medicare so not sure that insurance will pay. I will reach out to [facility] to see what resources are eligible to the patient.    Patient has Raised toilet set, shower chair, and son is putting in a ramp for the patient. She was offered to go live with her son when out of the hospital, but she refused.  Neruopathy: Patient takes Lyrica 200 mg twice a day for Neuropathy. Patient does not think that the dose is strong enough. When she was on the stronger dose was more controlled. Patient does not feel that this medication was to sedating. [name] is fine with starting to prescribe the medication.   Hypertension: Cardiology has done a Renal US for Hypertension. They found a blockage during the [place]. Patient has an appointment with Cardiology on 05/17/17.   Medication Management: Discussed with the patient that we will leave her with speciality until she is more controlled.   Headaches: Headaches start at temple and radiates to back of neck. We did X ray 10/26/16. [name] thinks that headaches ar coming from muscular skeletal. Lyrica will help with this as well. Muscle relaxer's will not be helpful with her. Will discuss tens unit and physical therapy.   There are no preventive care reminders to display for this patient. Depression screen [facility] 2/9 09/15/2016 09/13/2016 07/31/2015  Decreased Interest 0 0 0  Down, Depressed, Hopeless 0 0 0  PHQ - 2 Score 0 0 0  Some recent data might be hidden   PMHx, SurgHx, SocialHx, FamHx, Medications, and Allergies were reviewed in the Visit Navigator and updated as appropriate.   Patient Active Problem List   Diagnosis Date Noted    . Fall at home, initial encounter 01/07/2017  . Sacral fracture, closed ([place]) 01/07/2017  . Back pain 01/07/2017  . Aftercare for healing traumatic fracture of radius, right, closed 12/15/2016  . Closed fracture of ramus of right pubis ([place]) 11/22/2016  . Fall 11/21/2016  . Open Colles' fracture of right radius 11/02/2016  . Pigmented villonodular synovitis of knee, left 03/22/2016  . Abnormal radionuclide bone scan 08/16/2015  . Ankle fracture, bimalleolar, closed 08/16/2015  . HLD (hyperlipidemia) 07/28/2015  . Fatty liver 09/09/2014  . Lumbago 07/07/2014  . Abnormality of gait 03/27/2014  . Memory loss 01/08/2014  . 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 Paresthesia of foot 06/06/2012  . 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 Colon polyps 06/05/2008  . Osteoarthritis 06/04/2008  . Chronic diarrhea 04/22/2008  . Hypothyroidism 02/26/2007  . Adjustment disorder with mixed anxiety and depressed mood 02/26/2007  . Unspecified glaucoma 02/26/2007  . Essential hypertension 02/26/2007  . Mitral valve disease 02/26/2007  . Allergic rhinitis 02/26/2007  . GERD 02/26/2007  . Hiatal hernia 02/26/2007  . Osteoporosis 02/26/2007   Social History   Tobacco Use  . Smoking status: Never Smoker  . Smokeless tobacco: Never Used  Substance Use Topics  . Alcohol use: No  . Drug use: No   Current Medications and Allergies:   .  acetaminophen (TYLENOL) 325  MG tablet, Take 650 mg by mouth every 6 (six) hours as needed for moderate pain., Disp: , Rfl:  .  amLODipine (NORVASC) 5 MG tablet, Take 5 mg by mouth daily., Disp: , Rfl:  .  aspirin 81 MG tablet, Take 81 mg by mouth daily., Disp: , Rfl:  .  atenolol (TENORMIN) 25 MG tablet, Take 25 mg every morning by mouth., Disp: , Rfl:  .  atenolol (TENORMIN) 50 MG tablet, Take 50 mg every evening by mouth., Disp: , Rfl:  .   baclofen (LIORESAL) 10 MG tablet, Take 1 tablet (10 mg total) by mouth 3 (three) times daily as needed for muscle spasms., Disp: 9 tablet, Rfl: 0 .  busPIRone (BUSPAR) 15 MG tablet, Take 15 mg by mouth at bedtime., Disp: , Rfl:  .  Calcium Carbonate (CALCIUM 600 PO), Take 1 tablet by mouth daily., Disp: , Rfl:  .  carboxymethylcellulose (REFRESH TEARS) 0.5 % SOLN, Place 1 drop into both eyes 2 (two) times daily. , Disp: , Rfl:  .  cloNIDine (CATAPRES) 0.1 MG tablet, Take 0.1 mg by mouth every 4 (four) hours as needed., Disp: , Rfl:  .  DULoxetine (CYMBALTA) 30 MG capsule, Take 1 capsule (30 mg total) by mouth 2 (two) times daily., Disp: 180 capsule, Rfl: 3 .  fluticasone (FLONASE) 50 MCG/ACT nasal spray, Place 2 sprays into both nostrils daily., Disp: 16 g, Rfl: 6 .  latanoprost (XALATAN) 0.005 % ophthalmic solution, Place 1 drop into both eyes nightly., Disp: , Rfl:  .  levothyroxine (SYNTHROID, LEVOTHROID) 200 MCG tablet, Take 1 tablet (200 mcg total) by mouth daily before breakfast., Disp: , Rfl:  .  lipase/protease/amylase (CREON) 12000 UNITS CPEP capsule, Take 2 capsules (24,000 Units total) by mouth 3 (three) times daily before meals., Disp: 270 capsule, Rfl: 5 .  lisinopril (PRINIVIL,ZESTRIL) 20 MG tablet, Take 20 mg by mouth daily., Disp: , Rfl:  .  loperamide (IMODIUM A-D) 2 MG capsule, Take 2 mg by mouth as needed for diarrhea or loose stools., Disp: , Rfl:  .  lovastatin (MEVACOR) 40 MG tablet, TAKE 1 TABLET BY MOUTH AT  BEDTIME, Disp: 90 tablet, Rfl: 1 .  Multiple Vitamin (MULTIVITAMIN WITH MINERALS) TABS tablet, Take 1 tablet by mouth daily., Disp: , Rfl:  .  omeprazole (PRILOSEC OTC) 20 MG tablet, Take 20 mg by mouth 2 (two) times daily. , Disp: , Rfl:  .  ondansetron (ZOFRAN ODT) 4 MG disintegrating tablet, Take 1 tablet (4 mg total) by mouth every 8 (eight) hours as needed for nausea or vomiting., Disp: 20 tablet, Rfl: 0 .  oxyCODONE (OXY IR/ROXICODONE) 5 MG immediate release tablet,  Take 5 mg by mouth as directed. TAKE 1 AND 1/2 TABLET BY MOUTH 4 TIMES A DAY AS NEEDED FOR SEVERE PAIN, Disp: , Rfl:  .  polycarbophil (FIBERCON) 625 MG tablet, Take 625 mg by mouth 4 (four) times daily. , Disp: , Rfl:  .  polyethylene glycol (MIRALAX / GLYCOLAX) packet, Take 17 g by mouth daily as needed for mild constipation., Disp: 14 each, Rfl: 0 .  pregabalin (LYRICA) 200 MG capsule, Take 200 mg by mouth 2 (two) times daily., Disp: , Rfl:  .  Probiotic Product (PROBIOTIC-10 PO), Take 1 tablet by mouth 2 (two) times daily., Disp: , Rfl:  .  psyllium (REGULOID) 0.52 g capsule, Take 0.52 g by mouth daily., Disp: , Rfl:  .  rOPINIRole (REQUIP) 0.5 MG tablet, Take 1 tablet (0.5 mg total) by mouth  at bedtime., Disp: 30 tablet, Rfl: 6 .  simethicone (MYLICON) 301 MG chewable tablet, Chew 125 mg by mouth every 6 (six) hours as needed for flatulence., Disp: , Rfl:  .  vitamin B-12 (CYANOCOBALAMIN) 1000 MCG tablet, Take 2,000 mcg by mouth daily., Disp: , Rfl:  .  Vitamin D, Ergocalciferol, (DRISDOL) 50000 units CAPS capsule, Take 1 capsule (50,000 Units total) by mouth every 7 (seven) days. (Patient taking differently: Take 50,000 Units by mouth every 7 (seven) days. On Wednesdays), Disp: 10 capsule, Rfl: 0 .  vitamin E 400 UNIT capsule, Take 800 Units by mouth 2 (two) times daily. For tartive dyskinesia, Disp: , Rfl:    Allergies  Allergen Reactions  . Other Other (See Comments)    According to patient Trilafor and Reglan cause Tardive Dyskinesia  . Metoclopramide Hcl Other (See Comments)    Shaking; caused tardive dyskinesia  . Niacin Other (See Comments)    shaking  . Trovan [Alatrofloxacin Mesylate] Other (See Comments)    Caused shaking and nervousness  . Benzocaine-Resorcinol Rash  . Celecoxib Other (See Comments)    unknown  . Erythromycin Base Rash  . Glucosamine Other (See Comments)    unknown  . Nortriptyline Other (See Comments)    Dizziness   . Phenazopyridine Hcl Other (See  Comments)    unknown  . Sulfa Antibiotics Nausea And Vomiting and Rash  . Sulfonamide Derivatives Nausea And Vomiting and Rash   Review of Systems   Pertinent items are noted in the HPI. Otherwise, ROS is negative.  Vitals:   Vitals:   05/10/17 1538  BP: (!) 142/88  Pulse: 82  Temp: 98.5 F (36.9 C)  TempSrc: Oral  SpO2: 96%  Weight: 147 lb (66.7 kg)  Height: 5\' 1"  (1.549 m)     Body mass index is 27.78 kg/m.   Physical Exam:   Physical Exam  Constitutional: She appears well-nourished.  HENT:  Head: Normocephalic and atraumatic.  Eyes: EOM are normal. Pupils are equal, round, and reactive to light.  Neck: Normal range of motion. Neck supple.  Cardiovascular: Normal rate, regular rhythm, normal heart sounds and intact distal pulses.  Pulmonary/Chest: Effort normal.  Abdominal: Soft.  Skin: Skin is warm.  Psychiatric: She has a normal mood and affect. Her behavior is normal.  Nursing note and vitals reviewed.   Results for orders placed or performed in visit on 05/10/17  Comprehensive metabolic panel  Result Value Ref Range   Sodium 138 135 - 145 mEq/L   Potassium 4.6 3.5 - 5.1 mEq/L   Chloride 101 96 - 112 mEq/L   CO2 29 19 - 32 mEq/L   Glucose, Bld 97 70 - 99 mg/dL   BUN 14 6 - 23 mg/dL   Creatinine, Ser 0.80 0.40 - 1.20 mg/dL   Total Bilirubin 0.6 0.2 - 1.2 mg/dL   Alkaline Phosphatase 126 (H) 39 - 117 U/L   AST 25 0 - 37 U/L   ALT 22 0 - 35 U/L   Total Protein 6.7 6.0 - 8.3 g/dL   Albumin 3.9 3.5 - 5.2 g/dL   Calcium 9.0 8.4 - 10.5 mg/dL   GFR 73.79 >60.00 mL/min  Vitamin B12  Result Value Ref Range   Vitamin B-12 907 211 - 911 pg/mL   Assessment and Plan:   Meerab was seen today for follow-up.  Diagnoses and all orders for this visit:  Fatigue, unspecified type -     Comprehensive metabolic panel -  Vitamin B12  Vitamin B 12 deficiency -     Vitamin B12  Pain in both lower extremities Comments: Okay to increase Lyrica again as she  tolerated it before. Risks of falls discussed at length. If too sedating, will back off again.   Essential hypertension Comments: Resistant. Current work-up with Cardiology.  Medication management  Cervicogenic headache Comments: Offered PT and discussed other modalities of symptomatic care as well as red flags.    Records requested if needed. Time spent with the patient: 45 minutes, of which >50% was spent in obtaining information about her symptoms, reviewing her previous labs, evaluations, and treatments, counseling her about her condition (please see the discussed topics above), and developing a plan to further investigate it; she had a number of questions which I addressed. Significant time taken with patient and family to review MAR.  Marland Kitchen Reviewed expectations re: course of current medical issues. . Discussed self-management of symptoms. . Outlined signs and symptoms indicating need for more acute intervention. . Patient verbalized understanding and all questions were answered. Marland Kitchen Health Maintenance issues including appropriate healthy diet, exercise, and smoking avoidance were discussed with patient. . See orders for this visit as documented in the electronic medical record. . Patient received an After Visit Summary.  Briscoe Deutscher, DO [place] [place] 05/14/2017  Future Appointments  Date Time Provider [place]  05/17/2017  2:20 PM Skeet Latch, MD [facility]  08/08/2017 10:00 AM Briscoe Deutscher, DO [facility]  09/18/2017  9:00 AM Williemae Area, RN [facility]  09/28/2017 11:30 AM Ladell Pier, MD [facility] None

## 2017-05-10 NOTE — Progress Notes (Signed)
Patient comes in today for multiple concerns. Patient lives independently at this time. We will order some home health through [facility]. Patients son stated that that she has meet her limits on medicare so not sure that insurance will pay. I will reach out to [facility] to see what resources are eligible to the patient.   Patient has Raised toilet set, shower chair, and son is putting in a ramp for the patient. She was offered to go live with her son when out of the hospital, but she refused.  Neruopathy: Patient takes Lyrica 200 mg twice a day for Neuropathy. Patient does not think that the dose is strong enough. When she was on the stronger dose was more controlled. Patient does not feel that this medication was to sedating. [name] is fine with starting to prescribe the medication.   Hypertension: Cardiology has done a Renal US for Hypertension. They found a blockage during the [place]. Patient has an appointment with Cardiology on 05/17/17.   Medication Management: Discussed with the patient that we will leave her with speciality until she is more controlled.   Headaches: Headaches start at temple and radiates to back of neck. We did X ray 10/26/16. [name] thinks that headaches ar coming from muscular skeletal. Lyrica will help with this as well. Muscle relaxer's will not be helpful with her. Will discuss tens unit and physical therapy.

## 2017-05-10 NOTE — Addendum Note (Signed)
Addended by: Alvina Filbert B on: 05/10/2017 02:24 PM   Modules accepted: Orders

## 2017-05-10 NOTE — Telephone Encounter (Signed)
-----   Message from Skeet Latch, MD sent at 05/10/2017  8:27 AM EST ----- Labs are not consistent with a tumor causing her hypertension.

## 2017-05-11 ENCOUNTER — Telehealth: Payer: Self-pay | Admitting: Family Medicine

## 2017-05-11 LAB — COMPREHENSIVE METABOLIC PANEL
ALT: 22 U/L (ref 0–35)
AST: 25 U/L (ref 0–37)
Albumin: 3.9 g/dL (ref 3.5–5.2)
Alkaline Phosphatase: 126 U/L — ABNORMAL HIGH (ref 39–117)
BUN: 14 mg/dL (ref 6–23)
CO2: 29 mEq/L (ref 19–32)
Calcium: 9 mg/dL (ref 8.4–10.5)
Chloride: 101 mEq/L (ref 96–112)
Creatinine, Ser: 0.8 mg/dL (ref 0.40–1.20)
GFR: 73.79 mL/min (ref >60.00)
Glucose, Bld: 97 mg/dL (ref 70–99)
Potassium: 4.6 mEq/L (ref 3.5–5.1)
Sodium: 138 mEq/L (ref 135–145)
Total Bilirubin: 0.6 mg/dL (ref 0.2–1.2)
Total Protein: 6.7 g/dL (ref 6.0–8.3)

## 2017-05-11 LAB — VITAMIN B12: Vitamin B-12: 907 pg/mL (ref 211–911)

## 2017-05-11 MED ORDER — ATENOLOL 25 MG PO TABS: 25.0000 mg | ORAL_TABLET | ORAL | 5 refills | Status: DC

## 2017-05-11 MED ORDER — ATENOLOL 50 MG PO TABS
50.0000 mg | ORAL_TABLET | Freq: Every evening | ORAL | 5 refills | Status: DC
Start: 2017-05-11 — End: 2017-06-13

## 2017-05-11 MED ORDER — LEVOTHYROXINE SODIUM 200 MCG PO TABS: 200.0000 ug | ORAL_TABLET | Freq: Every day | ORAL | 11 refills | Status: DC

## 2017-05-11 MED ORDER — DULOXETINE HCL 30 MG PO CPEP: 30.0000 mg | ORAL_CAPSULE | Freq: Two times a day (BID) | ORAL | 3 refills | Status: DC

## 2017-05-11 MED ORDER — OMEPRAZOLE 20 MG PO CPDR
20.0000 mg | DELAYED_RELEASE_CAPSULE | Freq: Every day | ORAL | 3 refills | Status: DC
Start: 2017-05-11 — End: 2017-05-17

## 2017-05-11 MED ORDER — LOVASTATIN 40 MG PO TABS: 40.0000 mg | ORAL_TABLET | Freq: Every day | ORAL | 5 refills | Status: DC

## 2017-05-11 MED ORDER — ROPINIROLE HCL 0.5 MG PO TABS: 0.5000 mg | ORAL_TABLET | Freq: Every day | ORAL | 5 refills | Status: DC

## 2017-05-11 MED ORDER — AMLODIPINE BESYLATE 5 MG PO TABS: 5.0000 mg | ORAL_TABLET | Freq: Every day | ORAL | 5 refills | Status: DC

## 2017-05-11 MED ORDER — PREGABALIN 300 MG PO CAPS: 300.0000 mg | ORAL_CAPSULE | Freq: Every day | ORAL | 0 refills | Status: DC

## 2017-05-11 NOTE — Telephone Encounter (Signed)
Spoke with patient and let her know that we have sent all medications to the pharmacy.

## 2017-05-11 NOTE — Telephone Encounter (Signed)
Copied from [place]. Topic: Inquiry >> May 11, 2017 12:53 PM Cecelia Byars, NT wrote: Reason for CRM: Patients son called to follow up  to see  if patients prescriptions were sent in to  be filled at [facility] [place]   today ,unsure of which medication ,please call [phone], [name] does not need creon filled at this time also would like a hard copy of the lyrica ,he is leaving town tomorrow morning returning next Tuesday and would like  a response today if possible

## 2017-05-11 NOTE — Telephone Encounter (Signed)
What was you going to prescribe?

## 2017-05-11 NOTE — Telephone Encounter (Signed)
Lab looked good. Okay to Rx all medications. Okay to trial increased Lyrica after benefits/risk discussion.

## 2017-05-12 ENCOUNTER — Telehealth: Payer: Self-pay | Admitting: Family Medicine

## 2017-05-12 MED ORDER — LISINOPRIL 20 MG PO TABS: 20.0000 mg | ORAL_TABLET | Freq: Every day | ORAL | 1 refills | Status: DC

## 2017-05-12 NOTE — Telephone Encounter (Signed)
Copied from [place]. Topic: Quick Communication - See Telephone Encounter >> May 12, 2017  8:29 AM Ether Griffins B wrote: CRM for notification. See Telephone encounter for:  Pt completely out of lisinopril needing refill.  05/12/17.

## 2017-05-14 ENCOUNTER — Encounter: Payer: Self-pay | Admitting: Family Medicine

## 2017-05-17 ENCOUNTER — Ambulatory Visit (INDEPENDENT_AMBULATORY_CARE_PROVIDER_SITE_OTHER): Payer: Medicare Other | Admitting: Cardiovascular Disease

## 2017-05-17 ENCOUNTER — Encounter: Payer: Self-pay | Admitting: Cardiovascular Disease

## 2017-05-17 VITALS — BP 125/78 | HR 81 | Ht 61.0 in | Wt 148.0 lb

## 2017-05-17 DIAGNOSIS — R609 Edema, unspecified: Secondary | ICD-10-CM

## 2017-05-17 DIAGNOSIS — M25531 Pain in right wrist: Secondary | ICD-10-CM | POA: Diagnosis not present

## 2017-05-17 DIAGNOSIS — K551 Chronic vascular disorders of intestine: Secondary | ICD-10-CM

## 2017-05-17 DIAGNOSIS — R55 Syncope and collapse: Secondary | ICD-10-CM | POA: Diagnosis not present

## 2017-05-17 DIAGNOSIS — R0602 Shortness of breath: Secondary | ICD-10-CM

## 2017-05-17 DIAGNOSIS — I771 Stricture of artery: Secondary | ICD-10-CM | POA: Diagnosis not present

## 2017-05-17 DIAGNOSIS — I10 Essential (primary) hypertension: Secondary | ICD-10-CM | POA: Diagnosis not present

## 2017-05-17 DIAGNOSIS — S62101P Fracture of unspecified carpal bone, right wrist, subsequent encounter for fracture with malunion: Secondary | ICD-10-CM | POA: Diagnosis not present

## 2017-05-17 DIAGNOSIS — Z5181 Encounter for therapeutic drug level monitoring: Secondary | ICD-10-CM | POA: Diagnosis not present

## 2017-05-17 DIAGNOSIS — S52571E Other intraarticular fracture of lower end of right radius, subsequent encounter for open fracture type I or II with routine healing: Secondary | ICD-10-CM | POA: Diagnosis not present

## 2017-05-17 NOTE — Patient Instructions (Addendum)
Medication Instructions:  INCREASE YOUR LISINOPRIL TO 40 MG DAILY   Labwork: BMET IN 1 WEEK  Testing/Procedures: Your physician has requested that you have a lexiscan myoview. For further information please visit [email or web address]. Please follow instruction sheet, as given.  Follow-Up: Your physician recommends that you schedule a follow-up appointment in: 2-4 WEEKS WITH DR [facility] [place]  Any Other Special Instructions Will Be Listed Below (If Applicable). MONITOR YOUR BLOOD PRESSURE AND BRING LOG AND MACHINE TO YOUR FOLLOW UP   If you need a refill on your cardiac medications before your next appointment, please call your pharmacy.
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
## 2017-05-21 ENCOUNTER — Encounter: Payer: Self-pay | Admitting: Cardiovascular Disease

## 2017-05-21 DIAGNOSIS — K551 Chronic vascular disorders of intestine: Secondary | ICD-10-CM

## 2017-05-21 DIAGNOSIS — I771 Stricture of artery: Principal | ICD-10-CM

## 2017-05-21 HISTORY — DX: Chronic vascular disorders of intestine: K55.1

## 2017-05-24 ENCOUNTER — Telehealth: Payer: Self-pay | Admitting: Family Medicine

## 2017-05-24 DIAGNOSIS — N3941 Urge incontinence: Secondary | ICD-10-CM | POA: Diagnosis not present

## 2017-05-24 DIAGNOSIS — Z5181 Encounter for therapeutic drug level monitoring: Secondary | ICD-10-CM | POA: Diagnosis not present

## 2017-05-24 MED ORDER — BUSPIRONE HCL 5 MG PO TABS: 5.0000 mg | ORAL_TABLET | Freq: Every day | ORAL | 0 refills | Status: DC

## 2017-05-24 NOTE — Telephone Encounter (Signed)
I do not see in chart where their was a change or that you have ever prescribed for her?

## 2017-05-24 NOTE — Telephone Encounter (Signed)
Script sent in son called and let him know.

## 2017-05-24 NOTE — Telephone Encounter (Signed)
See note

## 2017-05-24 NOTE — Telephone Encounter (Signed)
Okay to refill and change to 5 mg.

## 2017-05-24 NOTE — Telephone Encounter (Signed)
Copied from [place]. Topic: Quick Communication - See Telephone Encounter >> May 24, 2017  1:34 PM Oneta Rack wrote: CRM for notification. See Telephone encounter for:   05/24/17.  Caller name: [name]  Relation to pt: son  Call back number: [phone] Pharmacy: [id number] - [place], [address] [place] [phone] (Phone) [phone] (Fax)    Reason for call:  Son requesting  busPIRone (BUSPAR) 5 MG tablet refill, son contacted pharmacy and due to the MG change from 15 to 71, son was advised to call office directly, please advise

## 2017-05-25 ENCOUNTER — Other Ambulatory Visit: Payer: Self-pay

## 2017-05-25 ENCOUNTER — Ambulatory Visit (INDEPENDENT_AMBULATORY_CARE_PROVIDER_SITE_OTHER): Payer: Medicare Other | Admitting: Vascular Surgery

## 2017-05-25 ENCOUNTER — Encounter: Payer: Self-pay | Admitting: Vascular Surgery

## 2017-05-25 VITALS — BP 153/82 | HR 62 | Temp 96.7°F | Resp 18 | Ht 61.0 in | Wt 150.1 lb

## 2017-05-25 DIAGNOSIS — R109 Unspecified abdominal pain: Secondary | ICD-10-CM

## 2017-05-25 DIAGNOSIS — R1010 Upper abdominal pain, unspecified: Secondary | ICD-10-CM | POA: Diagnosis not present

## 2017-05-25 DIAGNOSIS — I771 Stricture of artery: Principal | ICD-10-CM

## 2017-05-25 DIAGNOSIS — K551 Chronic vascular disorders of intestine: Secondary | ICD-10-CM

## 2017-05-25 LAB — BASIC METABOLIC PANEL
BUN/Creatinine Ratio: 16 (ref 12–28)
BUN: 12 mg/dL (ref 8–27)
CO2: 26 mmol/L (ref 20–29)
CREATININE: 0.73 mg/dL (ref 0.57–1.00)
Calcium: 9.1 mg/dL (ref 8.7–10.3)
Chloride: 102 mmol/L (ref 96–106)
GFR calc Af Amer: 92 mL/min/{1.73_m2} (ref >59)
GFR, EST NON AFRICAN AMERICAN: 80 mL/min/{1.73_m2} (ref >59)
Glucose: 82 mg/dL (ref 65–99)
Potassium: 4.6 mmol/L (ref 3.5–5.2)
SODIUM: 142 mmol/L (ref 134–144)

## 2017-05-25 NOTE — Progress Notes (Signed)
Referring Physician: Dr. Oval Linsey  Patient name: [name] MRN: [id number] DOB: Dec 20, 1940 Sex: female  [place]:.  Superior mesenteric artery stenosis HPI: [name] is a 76 y.o. 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  The duplex was ordered to evaluate for renal artery stenosis for hypertension.  Patient has a long history of abdominal pain.  States her abdomen hurts 2-3 times per week over the last 6 months.  She has no associated nausea vomiting or diarrhea.  She states that when the pain starts it will last for several days.  Of note she has had a Whipple procedure in the past for pancreatic cancer.  This was in 2015.  She denies food fear.  She denies postprandial pain.  She states she has gained 25 pounds over the last 3 years.  She is on aspirin and a statin.  Her duplex scan did not show any renal artery stenosis.  Other medical problems include reflux, hypertension, neuropathy, arthritis, of which have been stable.  Past Medical History:  Diagnosis Date  . Allergic rhinitis   . Cancer ([place]) 07/10/13   Pancreatic cancer with MRI scan 06-19-13  . Chronic maxillary sinusitis    neti pot  . Depression    alone a lot  . Eustachian tube dysfunction   . GERD (gastroesophageal reflux disease)   . Glaucoma   . Heart murmur    hx. "MVP" -predental antibiotics  . Hiatal hernia   . Hypertension   . Hypothyroid   . Memory loss    short term memory loss  . Mitral valve prolapse    antibiotics before dental procedures  . Neuropathy   . Osteoarthritis   . Stroke [facility])    mini storkes left leg paraylsis. patient denies weakness 01/08/14.   . 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 Tardive dyskinesia    possibly reglan, vitamin E helps  . Urgency of urination    some UTI in past   Past Surgical History:  Procedure Laterality Date  . 1 baker cyst removed    . ABDOMINAL  HYSTERECTOMY     including ovaries  . BACK SURGERY     fusion  . BLEPHAROPLASTY Bilateral    with cataract surgery  . BREAST EXCISIONAL BIOPSY     left x2  . BREAST SURGERY     Biopsy left 2 times  . COLONOSCOPY W/ POLYPECTOMY     2004 last colonoscopy, no polyps  . DILATION AND CURETTAGE OF UTERUS     x3  . ESOPHAGOGASTRODUODENOSCOPY N/A 09/11/2013   Procedure: ESOPHAGOGASTRODUODENOSCOPY (EGD);  Surgeon: Cleotis Nipper, MD;  Location: [facility] ENDOSCOPY;  Service: Endoscopy;  Laterality: N/A;  Moderate sedation okay if MAC not available  . EUS N/A 07/10/2013   Procedure: ESOPHAGEAL ENDOSCOPIC ULTRASOUND (EUS) RADIAL;  Surgeon: Arta Silence, MD;  Location: [facility] ENDOSCOPY;  Service: Endoscopy;  Laterality: N/A;  . EYE SURGERY Right    cataract  . FINE NEEDLE ASPIRATION N/A 07/10/2013   Procedure: FINE NEEDLE ASPIRATION (FNA) LINEAR;  Surgeon: Arta Silence, MD;  Location: [facility] ENDOSCOPY;  Service: Endoscopy;  Laterality: N/A;  possible fna  . HARDWARE REMOVAL Right 12/15/2016   Procedure: Hardware removal and tenolysis right wrist with repair reconstruction as necessary;  Surgeon: Roseanne Kaufman, MD;  Location: [place];  Service: Orthopedics;  Laterality: Right;  60 mins  . JOINT REPLACEMENT  LTKA  . KNEE SURGERY Left    x 5, total knee Left knee  . LAPAROSCOPY N/A 08/07/2013   Procedure: LAPAROSCOPY DIAGNOSTIC;  Surgeon: [name], MD;  Location: [place];  Service: General;  Laterality: N/A;  . LUMBAR SPINE SURGERY     x2  . LUMBAR SPINE SURGERY     cyst  . ORIF ANKLE FRACTURE Right 08/16/2015   Procedure: OPEN REDUCTION INTERNAL FIXATION (ORIF) ANKLE FRACTURE;  Surgeon: Meredith Pel, MD;  Location: [place];  Service: Orthopedics;  Laterality: Right;  . ORIF WRIST FRACTURE Right 11/01/2016   Procedure: OPEN REDUCTION INTERNAL FIXATION (ORIF) RIGHT WRIST FRACTURE WITH APPLICATION OF SPANNING PLATE, IRRIGATION AND DEBRIDEMENT RIGHT WRIST;  Surgeon: Roseanne Kaufman, MD;  Location: [facility];   Service: Orthopedics;  Laterality: Right;  . RADIOACTIVE SEED GUIDED EXCISIONAL BREAST BIOPSY Left 12/15/2015   Procedure: LEFT RADIOACTIVE SEED GUIDED EXCISIONAL BREAST BIOPSY;  Surgeon: [name], MD;  Location: [place];  Service: General;  Laterality: Left;  . WHIPPLE PROCEDURE N/A 08/07/2013   Procedure: WHIPPLE PROCEDURE;  Surgeon: [name], MD;  Location: [facility] OR;  Service: General;  Laterality: N/A;    Family History  Problem Relation Age of Onset  . Cancer Mother        Breast Cancer with Metastatic disease  . Alzheimer's disease Father   . Other Brother 17       GSW    SOCIAL HISTORY: Social History   Socioeconomic History  . Marital status: Widowed    Spouse name: Not on file  . Number of children: 2  . Years of education: 8  . Highest education level: Not on file  Social Needs  . Financial resource strain: Not on file  . Food insecurity - worry: Not on file  . Food insecurity - inability: Not on file  . Transportation needs - medical: Not on file  . Transportation needs - non-medical: Not on file  Occupational History    Employer: RETIRED  . Occupation: Retired  Tobacco Use  . Smoking status: Never Smoker  . Smokeless tobacco: Never Used  Substance and Sexual Activity  . Alcohol use: No  . Drug use: No  . Sexual activity: Not Currently  Other Topics Concern  . 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 Other Other (See Comments)    According to patient Trilafor and Reglan cause Tardive Dyskinesia  .  Metoclopramide Hcl Other (See Comments)    Shaking; caused tardive dyskinesia  . Niacin Other (See Comments)    shaking  . Trovan [Alatrofloxacin Mesylate] Other (See Comments)    Caused shaking and nervousness  . Benzocaine-Resorcinol Rash  . Celecoxib Other (See Comments)    unknown  . Erythromycin Base Rash  . Glucosamine Other (See Comments)    unknown  . Nortriptyline Other (See Comments)    Dizziness   . Phenazopyridine Hcl  Other (See Comments)    unknown  . Sulfa Antibiotics Nausea And Vomiting and Rash  . Sulfonamide Derivatives Nausea And Vomiting and Rash    Current Outpatient Medications  Medication Sig Dispense Refill  . acetaminophen (TYLENOL) 325 MG tablet Take 650 mg by mouth every 6 (six) hours as needed for moderate pain.    Marland Kitchen amLODipine (NORVASC) 5 MG tablet Take 1 tablet (5 mg total) by mouth daily. 30 tablet 5  . aspirin 81 MG tablet Take 81 mg by mouth daily.    Marland Kitchen atenolol (TENORMIN) 25 MG tablet Take 1 tablet (25 mg total) by mouth every morning. 30 tablet 5  . atenolol (TENORMIN) 50 MG tablet Take 1 tablet (50 mg total) by mouth every evening. 30 tablet 5  . baclofen (LIORESAL) 10 MG tablet Take 1 tablet (10 mg total) by mouth 3 (three) times daily as needed for muscle spasms. 9 tablet 0  . busPIRone (BUSPAR) 5 MG tablet Take 1 tablet (5 mg total) by mouth daily. 30 tablet 0  . Calcium Carbonate (CALCIUM 600 PO) Take 1 tablet by mouth daily.    . carboxymethylcellulose (REFRESH TEARS) 0.5 % SOLN Place 1 drop into both eyes 2 (two) times daily.     . DULoxetine (CYMBALTA) 30 MG capsule Take 1 capsule (30 mg total) by mouth 2 (two) times daily. 60 capsule 3  . fluticasone (FLONASE) 50 MCG/ACT nasal spray Place 2 sprays into both nostrils daily. 16 g 6  . latanoprost (XALATAN) 0.005 % ophthalmic solution Place 1 drop into both eyes nightly.    . levothyroxine (SYNTHROID, LEVOTHROID) 200 MCG tablet Take 1 tablet (200 mcg total) by mouth daily before breakfast. 30  tablet 11  . lipase/protease/amylase (CREON) 12000 UNITS CPEP capsule Take 2 capsules (24,000 Units total) by mouth 3 (three) times daily before meals. 270 capsule 5  . lisinopril (PRINIVIL,ZESTRIL) 20 MG tablet Take 20 mg by mouth as directed. 2 TABLETS BY MOUTH DAILY    . loperamide (IMODIUM A-D) 2 MG capsule Take 2 mg by mouth as needed for diarrhea or loose stools.    . lovastatin (MEVACOR) 40 MG tablet Take 1 tablet (40 mg total) by mouth at bedtime. 30 tablet 5  . Multiple Vitamin (MULTIVITAMIN WITH MINERALS) TABS tablet Take 1 tablet by mouth daily.    Marland Kitchen omeprazole (PRILOSEC OTC) 20 MG tablet Take 20 mg by mouth 2 (two) times daily.     . ondansetron (ZOFRAN ODT) 4 MG disintegrating tablet Take 1 tablet (4 mg total) by mouth every 8 (eight) hours as needed for nausea or vomiting. 20 tablet 0  . oxyCODONE (OXY IR/ROXICODONE) 5 MG immediate release tablet Take 5 mg by mouth as directed. TAKE 1 AND 1/2 TABLET BY MOUTH 4 TIMES A DAY AS NEEDED FOR SEVERE PAIN    . polycarbophil (FIBERCON) 625 MG tablet Take 625 mg by mouth 4 (four) times daily.     . polyethylene glycol (MIRALAX / GLYCOLAX) packet Take 17 g by mouth daily as needed for mild constipation. 14 each 0  . pregabalin (LYRICA) 300 MG capsule Take 1 capsule (300 mg total) by mouth at bedtime. 30 capsule 0  . Probiotic Product (PROBIOTIC-10 PO) Take 1 tablet by mouth 2 (two) times daily.    . psyllium (REGULOID) 0.52 g capsule Take 0.52 g by mouth daily.    Marland Kitchen rOPINIRole (REQUIP) 0.5 MG tablet Take 1 tablet (0.5 mg total) by mouth at bedtime. 30 tablet 5  . simethicone (  MYLICON) 389 MG chewable tablet Chew 125 mg by mouth every 6 (six) hours as needed for flatulence.    . vitamin B-12 (CYANOCOBALAMIN) 1000 MCG tablet Take 2,000 mcg by mouth daily.    . Vitamin D, Ergocalciferol, (DRISDOL) 50000 units CAPS capsule Take 1 capsule (50,000 Units total) by mouth every 7 (seven) days. (Patient taking differently: Take 50,000 Units by mouth every  7 (seven) days. On Wednesdays) 10 capsule 0  . vitamin E 400 UNIT capsule Take 800 Units by mouth 2 (two) times daily. For tartive dyskinesia     No current facility-administered medications for this visit.     ROS:   General:  No weight loss, Fever, chills  HEENT: No recent headaches, no nasal bleeding, no visual changes, no sore throat  Neurologic: No dizziness, blackouts, seizures. No recent symptoms of stroke or mini- stroke. No recent episodes of slurred speech, or temporary blindness.  Cardiac:   + shortness of breath with exertion.    Vascular: No history of rest pain in feet.  No history of claudication.  No history of non-healing ulcer, No history of DVT   Pulmonary: No home oxygen, no productive cough, no hemoptysis,  + wheezing  Musculoskeletal:  _0  Arthritis, _1  Low back pain,  _2  Joint pain  Hematologic:No history of hypercoagulable state.  No history of easy bleeding.  No history of anemia  Gastrointestinal: No hematochezia or melena,  + gastroesophageal reflux, no trouble swallowing  Urinary: _3  chronic Kidney disease, _4  on HD - _5  MWF or _6  TTHS, _7  Burning with urination, _8  Frequent urination, _9  Difficulty urinating;   Skin: No rashes  Psychological: No history of anxiety,  No history of depression   Physical Examination  Vitals:   05/25/17 1118  BP: (!) 153/82  Pulse: 62  Resp: 18  Temp: (!) 96.7 F (35.9 C)  TempSrc: Oral  SpO2: 100%  Weight: 150 lb 1.6 oz (68.1 kg)  Height: _10  (1.549 m)    Body mass index is 28.36 kg/m.  General:  Alert and oriented, no acute distress HEENT: Normal Neck: No bruit or JVD Pulmonary: Clear to auscultation bilaterally Cardiac: Regular Rate and Rhythm without murmur Abdomen: Soft, non-tender, non-distended, no mass Skin: No rash Extremity Pulses:  2+ radial, brachial, femoral, posterior tibial pulses bilaterally Musculoskeletal: Right wrist deformity from prior fracture  Neurologic: Upper and  lower extremity motor 5/5 and symmetric  DATA:  I reviewed the patient's recent duplex exam.  This is dated May 01, 2017.  Showed a velocity of 294 cm/s at the proximal superior mesenteric artery.  ASSESSMENT: Possible superior mesenteric artery stenosis by duplex exam.  However, the patient's symptoms clinically do not really sound consistent with this.   PLAN: We will schedule her for CT angiogram of the abdomen and pelvis for further evaluation.  She will return for follow-up in 2-3 weeks after her CT scan.   Ruta Hinds, MD [facility] Office: [phone] Pager: [phone]

## 2017-05-29 DIAGNOSIS — G2401 Drug induced subacute dyskinesia: Secondary | ICD-10-CM | POA: Insufficient documentation

## 2017-05-29 DIAGNOSIS — J320 Chronic maxillary sinusitis: Secondary | ICD-10-CM | POA: Insufficient documentation

## 2017-05-29 DIAGNOSIS — H6990 Unspecified Eustachian tube disorder, unspecified ear: Secondary | ICD-10-CM | POA: Insufficient documentation

## 2017-05-29 DIAGNOSIS — H6980 Other specified disorders of Eustachian tube, unspecified ear: Secondary | ICD-10-CM | POA: Insufficient documentation

## 2017-05-29 DIAGNOSIS — Z8673 Personal history of transient ischemic attack (TIA), and cerebral infarction without residual deficits: Secondary | ICD-10-CM | POA: Insufficient documentation

## 2017-05-29 DIAGNOSIS — I341 Nonrheumatic mitral (valve) prolapse: Secondary | ICD-10-CM | POA: Insufficient documentation

## 2017-05-31 ENCOUNTER — Telehealth (HOSPITAL_COMMUNITY): Payer: Self-pay

## 2017-05-31 ENCOUNTER — Telehealth: Payer: Self-pay | Admitting: Family Medicine

## 2017-05-31 NOTE — Telephone Encounter (Signed)
Copied from [place] [id number]. Topic: Quick Communication - See Telephone Encounter >> May 31, 2017 10:16 AM Aurelio Brash B wrote: CRM for notification. See Telephone encounter for:  Pts son [name] called to ask questions about dosage of the pts   buspirone, he would like a call back at [phone]

## 2017-05-31 NOTE — Telephone Encounter (Signed)
Please see telephone note below. Call number provided to advise.

## 2017-05-31 NOTE — Telephone Encounter (Signed)
Spoke with patients son and told him that I would send in the new dose of the Buspar. He stated that they have enough Lyrica to increase to the 300 mg BID. He will let us know if they need more. He said that his mother is not have any sedation form the medication.

## 2017-05-31 NOTE — Telephone Encounter (Signed)
Spoke with patients son and he stated that the prescription for Buspar was only sent in for 5 mg daily. Per records that they brought in from the nursing facility that she was in she takes 5 mg twice a day. Son wants to know if it is safe for her to drop to the lower dose. She has been on this dose for a while and was even on 15 mg at one point.  Son also is concerned because the Lyrica is not helping his mom with her pain. She was taking 200 mg twice a day and now she is just taking the 300 mg at night. He is wanting to know if this can be increased.

## 2017-05-31 NOTE — Telephone Encounter (Signed)
Okay to change BuSpar to 5 mg twice daily.  If the patient is having improvement in pain and no overt sedation with the increased dose of Lyrica to 300 mg nightly, okay to increase her a.m. dose to 300 mg.  Please make sure that she has a follow-up on the books.

## 2017-05-31 NOTE — Telephone Encounter (Signed)
Encounter complete. 

## 2017-06-01 ENCOUNTER — Telehealth: Payer: Self-pay | Admitting: Family Medicine

## 2017-06-01 ENCOUNTER — Inpatient Hospital Stay (HOSPITAL_COMMUNITY): Admission: RE | Admit: 2017-06-01 | Payer: Medicare Other | Source: Ambulatory Visit

## 2017-06-01 NOTE — Telephone Encounter (Signed)
Copied from [place] [id number]. Topic: Quick Communication - See Telephone Encounter >> May 31, 2017 10:16 AM Aurelio Brash B wrote: CRM for notification. See Telephone encounter for:  Pts son [name] called to ask questions about dosage of the pts   buspirone, he would like a call back at [phone] >> Jun 01, 2017  3:55 PM Darl Householder, RMA wrote: Pt's son [name] [place] is calling to check status on refill please return pt call

## 2017-06-02 ENCOUNTER — Ambulatory Visit (HOSPITAL_COMMUNITY)
Admission: RE | Admit: 2017-06-02 | Discharge: 2017-06-02 | Disposition: A | Discharge status: Home / Self Care | Payer: Medicare Other | Source: Ambulatory Visit | Attending: Cardiovascular Disease | Admitting: Cardiovascular Disease

## 2017-06-02 DIAGNOSIS — R0602 Shortness of breath: Secondary | ICD-10-CM | POA: Diagnosis not present

## 2017-06-02 LAB — MYOCARDIAL PERFUSION IMAGING
CHL CUP RESTING HR STRESS: 66 {beats}/min
CSEPPHR: 97 {beats}/min
LV dias vol: 69 mL (ref 46–106)
LV sys vol: 26 mL
NUC STRESS TID: 1.06
SDS: 2
SRS: 0
SSS: 2

## 2017-06-02 MED ORDER — TECHNETIUM TC 99M TETROFOSMIN IV KIT
31.7000 | PACK | Freq: Once | INTRAVENOUS | Status: AC | PRN
  Administered 2017-06-02: 31.7 via INTRAVENOUS
  Filled 2017-06-02: qty 32

## 2017-06-02 MED ORDER — REGADENOSON 0.4 MG/5ML IV SOLN
0.4000 mg | Freq: Once | INTRAVENOUS | Status: AC
  Administered 2017-06-02: 0.4 mg via INTRAVENOUS

## 2017-06-02 MED ORDER — AMINOPHYLLINE 25 MG/ML IV SOLN
75.0000 mg | Freq: Once | INTRAVENOUS | Status: AC
Start: 2017-06-02 — End: 2017-06-02
  Administered 2017-06-02: 75 mg via INTRAVENOUS

## 2017-06-02 MED ORDER — TECHNETIUM TC 99M TETROFOSMIN IV KIT
10.5000 | PACK | Freq: Once | INTRAVENOUS | Status: AC | PRN
  Administered 2017-06-02: 10.5 via INTRAVENOUS
  Filled 2017-06-02: qty 11

## 2017-06-02 MED ORDER — BUSPIRONE HCL 5 MG PO TABS: 5.0000 mg | ORAL_TABLET | Freq: Two times a day (BID) | ORAL | 5 refills | Status: DC

## 2017-06-02 NOTE — Addendum Note (Signed)
Addended by: Durwin Glaze on: 06/02/2017 07:33 AM   Modules accepted: Orders

## 2017-06-02 NOTE — Telephone Encounter (Signed)
Notified patients son that prescription has been sent in.

## 2017-06-02 NOTE — Telephone Encounter (Signed)
I have sent in the new dosage of Buspar. I will call the patients son to let him know.

## 2017-06-11 ENCOUNTER — Other Ambulatory Visit: Payer: Self-pay | Admitting: Family Medicine
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
## 2017-06-12 NOTE — Telephone Encounter (Signed)
MEDICATION:   PHARMACY:    IS THIS A 90 DAY SUPPLY : no  IS PATIENT OUT OF MEDICATION:   IF NOT; HOW MUCH IS LEFT:   LAST APPOINTMENT DATE: @1 /07/2017  NEXT APPOINTMENT DATE:@3 /04/2018  OTHER COMMENTS: last refill given 05-11-17 # 30 with no refills for Lyrica 300mg     **Let patient know to contact pharmacy at the end of the day to make sure medication is ready. **  ** Please notify patient to allow 48-72 hours to process**  **Encourage patient to contact the pharmacy for refills or they can request refills through [facility]**

## 2017-06-13 ENCOUNTER — Encounter: Payer: Self-pay | Admitting: Cardiovascular Disease

## 2017-06-13 ENCOUNTER — Ambulatory Visit (INDEPENDENT_AMBULATORY_CARE_PROVIDER_SITE_OTHER): Payer: Medicare Other | Admitting: Cardiovascular Disease

## 2017-06-13 VITALS — BP 132/78 | HR 78 | Ht 61.0 in | Wt 147.0 lb

## 2017-06-13 DIAGNOSIS — E785 Hyperlipidemia, unspecified: Secondary | ICD-10-CM | POA: Diagnosis not present

## 2017-06-13 DIAGNOSIS — I771 Stricture of artery: Secondary | ICD-10-CM | POA: Diagnosis not present

## 2017-06-13 DIAGNOSIS — I10 Essential (primary) hypertension: Secondary | ICD-10-CM | POA: Diagnosis not present

## 2017-06-13 DIAGNOSIS — L749 Eccrine sweat disorder, unspecified: Secondary | ICD-10-CM

## 2017-06-13 DIAGNOSIS — R110 Nausea: Secondary | ICD-10-CM

## 2017-06-13 DIAGNOSIS — K551 Chronic vascular disorders of intestine: Secondary | ICD-10-CM

## 2017-06-13 MED ORDER — ATENOLOL 50 MG PO TABS: 50.0000 mg | ORAL_TABLET | Freq: Two times a day (BID) | ORAL | 5 refills | Status: DC

## 2017-06-13 NOTE — Patient Instructions (Addendum)
Medication Instructions:  INCREASE YOUR ATENOLOL TO 50 MG TWICE A DAY   Labwork: LAB WORK TODAY  Testing/Procedures: NONE  Follow-Up: Your physician recommends that you schedule a follow-up appointment in: [place] FOR BLOOD PRESSURE  Your physician wants you to follow-up in: [place] will receive a reminder letter in the mail two months in advance. If you don't receive a letter, please call our office to schedule the follow-up appointment.  Any Other Special Instructions Will Be Listed Below (If Applicable). MONITOR YOUR BLOOD PRESSURE AT HOME IF IT REMAINS ABOVE 130/80 INCREASE YOUR AMLODIPINE TO 10 MG DAILY  BRING YOUR READINGS TO YOUR FOLLOW UP IN 1 MONTH   If you need a refill on your cardiac medications before your next appointment, please call your pharmacy.

## 2017-06-15 ENCOUNTER — Encounter: Payer: Self-pay | Admitting: Cardiovascular Disease

## 2017-06-17 LAB — CATECHOLAMINES, FRACTIONATED, PLASMA
DOPAMINE: 79 pg/mL — AB (ref 0–48)
Epinephrine: <15 pg/mL (ref 0–62)
Norepinephrine: 1445 pg/mL — ABNORMAL HIGH (ref 0–874)

## 2017-06-17 LAB — METANEPHRINES, PLASMA
Metanephrine, Free: 22 pg/mL (ref 0–62)
NORMETANEPHRINE FREE: 399 pg/mL — AB (ref 0–145)

## 2017-06-20 ENCOUNTER — Ambulatory Visit (INDEPENDENT_AMBULATORY_CARE_PROVIDER_SITE_OTHER): Payer: Medicare Other

## 2017-06-20 ENCOUNTER — Ambulatory Visit (INDEPENDENT_AMBULATORY_CARE_PROVIDER_SITE_OTHER): Payer: Medicare Other | Admitting: Orthopaedic Surgery

## 2017-06-20 ENCOUNTER — Encounter (INDEPENDENT_AMBULATORY_CARE_PROVIDER_SITE_OTHER): Payer: Self-pay | Admitting: Orthopaedic Surgery

## 2017-06-20 VITALS — BP 133/69 | HR 67 | Ht 61.0 in | Wt 147.0 lb

## 2017-06-20 DIAGNOSIS — M545 Low back pain: Secondary | ICD-10-CM

## 2017-06-20 DIAGNOSIS — M7062 Trochanteric bursitis, left hip: Secondary | ICD-10-CM

## 2017-06-20 DIAGNOSIS — M7061 Trochanteric bursitis, right hip: Secondary | ICD-10-CM | POA: Diagnosis not present

## 2017-06-20 DIAGNOSIS — Z87898 Personal history of other specified conditions: Secondary | ICD-10-CM | POA: Diagnosis not present

## 2017-06-20 DIAGNOSIS — M461 Sacroiliitis, not elsewhere classified: Secondary | ICD-10-CM | POA: Diagnosis not present

## 2017-06-20 MED ORDER — KETOROLAC TROMETHAMINE 30 MG/ML IJ SOLN: 30.0000 mg | Freq: Once | INTRAMUSCULAR | Status: AC

## 2017-06-20 MED ORDER — HYDROCODONE-ACETAMINOPHEN 5-325 MG PO TABS: 1.0000 | ORAL_TABLET | Freq: Two times a day (BID) | ORAL | 0 refills | Status: DC | PRN

## 2017-06-20 MED ORDER — METHYLPREDNISOLONE 4 MG PO TABS
ORAL_TABLET | ORAL | 0 refills | Status: DC
Start: 2017-06-20 — End: 2017-07-14

## 2017-06-20 MED ORDER — METHYLPREDNISOLONE ACETATE 80 MG/ML IJ SUSP
80.0000 mg | Freq: Once | INTRAMUSCULAR | Status: DC
Start: 2017-06-20 — End: 2017-08-08
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
## 2017-06-22 ENCOUNTER — Other Ambulatory Visit: Payer: Self-pay

## 2017-06-22 ENCOUNTER — Ambulatory Visit
Admission: RE | Admit: 2017-06-22 | Discharge: 2017-06-22 | Disposition: A | Discharge status: Home / Self Care | Payer: Medicare Other | Source: Ambulatory Visit | Attending: Vascular Surgery | Admitting: Vascular Surgery

## 2017-06-22 ENCOUNTER — Ambulatory Visit (INDEPENDENT_AMBULATORY_CARE_PROVIDER_SITE_OTHER): Payer: Medicare Other | Admitting: Vascular Surgery

## 2017-06-22 ENCOUNTER — Encounter: Payer: Self-pay | Admitting: Vascular Surgery

## 2017-06-22 VITALS — BP 142/74 | HR 67 | Temp 97.7°F | Resp 14 | Ht 61.0 in | Wt 147.0 lb

## 2017-06-22 DIAGNOSIS — R1030 Lower abdominal pain, unspecified: Secondary | ICD-10-CM

## 2017-06-22 DIAGNOSIS — K551 Chronic vascular disorders of intestine: Secondary | ICD-10-CM

## 2017-06-22 DIAGNOSIS — K5730 Diverticulosis of large intestine without perforation or abscess without bleeding: Secondary | ICD-10-CM | POA: Diagnosis not present

## 2017-06-22 DIAGNOSIS — R109 Unspecified abdominal pain: Secondary | ICD-10-CM

## 2017-06-22 DIAGNOSIS — I771 Stricture of artery: Principal | ICD-10-CM

## 2017-06-22 MED ORDER — IOPAMIDOL (ISOVUE-370) INJECTION 76%
75.0000 mL | Freq: Once | INTRAVENOUS | Status: AC | PRN
  Administered 2017-06-22: 75 mL via INTRAVENOUS

## 2017-06-22 NOTE — Progress Notes (Signed)
error 

## 2017-06-22 NOTE — Progress Notes (Signed)
HISTORY AND PHYSICAL     CC:  Follow up for possible artery blockage Requesting Provider:  Briscoe Deutscher, DO  HPI: This is a 77 y.o. female who was seen by Dr. 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  She has abdominal pain across the lower portion of her abdomen.  She denies any pain after eating, does not have fear of food and has gained weight and not lost weight.  Dr. Oneida Alar sent her for CTA of the abdomen and pelvis and she returns today for discussion of results.    She takes a daily aspirin.  The pt is on a statin for cholesterol management.  She is on medication for blood pressure management.  She has a hx of Whipple procedure for pancreatic cancer.  She has never smoked.   Past Medical History:  Diagnosis Date  . Allergic rhinitis   . Cancer ([place]) 07/10/13   Pancreatic cancer with MRI scan 06-19-13  . Chronic maxillary sinusitis    neti pot  . Depression    alone a lot  . Eustachian tube dysfunction   . GERD (gastroesophageal reflux disease)   . Glaucoma   . Heart murmur    hx. "MVP" -predental antibiotics  . Hiatal hernia   . Hypertension   . Hypothyroid   . Memory loss    short term memory loss  . Mitral valve prolapse    antibiotics before dental procedures  . Neuropathy   . Osteoarthritis   . Stroke [facility])    mini storkes left leg paraylsis. patient denies weakness 01/08/14.   . 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 Tardive dyskinesia    possibly reglan, vitamin E helps  . Urgency of urination    some UTI in past    Past Surgical History:  Procedure Laterality Date  . 1 baker cyst removed    . ABDOMINAL HYSTERECTOMY     including ovaries  . BACK SURGERY     fusion  . BLEPHAROPLASTY Bilateral    with cataract surgery  . BREAST EXCISIONAL BIOPSY     left x2    . BREAST SURGERY     Biopsy left 2 times  . COLONOSCOPY W/ POLYPECTOMY     2004 last colonoscopy, no polyps  . DILATION AND CURETTAGE OF UTERUS     x3  . ESOPHAGOGASTRODUODENOSCOPY N/A 09/11/2013   Procedure: ESOPHAGOGASTRODUODENOSCOPY (EGD);  Surgeon: Cleotis Nipper, MD;  Location: [facility] ENDOSCOPY;  Service: Endoscopy;  Laterality: N/A;  Moderate sedation okay if MAC not available  . EUS N/A 07/10/2013   Procedure: ESOPHAGEAL ENDOSCOPIC ULTRASOUND (EUS) RADIAL;  Surgeon: Arta Silence, MD;  Location: [facility] ENDOSCOPY;  Service: Endoscopy;  Laterality: N/A;  . EYE SURGERY Right    cataract  . FINE NEEDLE ASPIRATION N/A 07/10/2013   Procedure: FINE NEEDLE ASPIRATION (FNA) LINEAR;  Surgeon: Arta Silence, MD;  Location: [facility] ENDOSCOPY;  Service: Endoscopy;  Laterality: N/A;  possible fna  . HARDWARE REMOVAL Right 12/15/2016   Procedure: Hardware removal and tenolysis right wrist with repair reconstruction as necessary;  Surgeon: Roseanne Kaufman, MD;  Location: [place];  Service: Orthopedics;  Laterality: Right;  60 mins  . JOINT REPLACEMENT     LTKA  . KNEE SURGERY Left    x 5, total knee  Left knee  . LAPAROSCOPY N/A 08/07/2013   Procedure: LAPAROSCOPY DIAGNOSTIC;  Surgeon: [name], MD;  Location: [place];  Service: General;  Laterality: N/A;  . LUMBAR SPINE SURGERY     x2  . LUMBAR SPINE SURGERY     cyst  . ORIF ANKLE FRACTURE Right 08/16/2015   Procedure: OPEN REDUCTION INTERNAL FIXATION (ORIF) ANKLE FRACTURE;  Surgeon: Meredith Pel, MD;  Location: [place];  Service: Orthopedics;  Laterality: Right;  . ORIF WRIST FRACTURE Right 11/01/2016   Procedure: OPEN REDUCTION INTERNAL FIXATION (ORIF) RIGHT WRIST FRACTURE WITH APPLICATION OF SPANNING PLATE, IRRIGATION AND DEBRIDEMENT RIGHT WRIST;  Surgeon: Roseanne Kaufman, MD;  Location: [facility];  Service: Orthopedics;  Laterality: Right;  . RADIOACTIVE SEED GUIDED EXCISIONAL BREAST BIOPSY Left 12/15/2015   Procedure: LEFT RADIOACTIVE SEED GUIDED  EXCISIONAL BREAST BIOPSY;  Surgeon: [name], MD;  Location: [place];  Service: General;  Laterality: Left;  . WHIPPLE PROCEDURE N/A 08/07/2013   Procedure: WHIPPLE PROCEDURE;  Surgeon: [name], MD;  Location: [place];  Service: General;  Laterality: N/A;    Allergies  Allergen Reactions  . Other Other (See Comments)    According to patient Trilafor and Reglan cause Tardive Dyskinesia  . Metoclopramide Hcl Other (See Comments)    Shaking; caused tardive dyskinesia  . Niacin Other (See Comments)    shaking  . Trovan [Alatrofloxacin Mesylate] Other (See Comments)    Caused shaking and nervousness  . Benzocaine-Resorcinol Rash  . Celecoxib Other (See Comments)    unknown  . Erythromycin Base Rash  . Glucosamine Other (See Comments)    unknown  . Nortriptyline Other (See Comments)    Dizziness   . Phenazopyridine Hcl Other (See Comments)    unknown  . Sulfa Antibiotics Nausea And Vomiting and Rash  . Sulfonamide Derivatives Nausea And Vomiting and Rash    Current Outpatient Medications  Medication Sig Dispense Refill  . acetaminophen (TYLENOL) 325 MG tablet Take 650 mg by mouth every 6 (six) hours as needed for moderate pain.    Marland Kitchen amLODipine (NORVASC) 5 MG tablet Take 1 tablet (5 mg total) by mouth daily. 30 tablet 5  . aspirin 81 MG tablet Take 81 mg by mouth daily.    Marland Kitchen atenolol (TENORMIN) 50 MG tablet Take 1 tablet (50 mg total) by mouth 2 (two) times daily. 60 tablet 5  . baclofen (LIORESAL) 10 MG tablet Take 1 tablet (10 mg total) by mouth 3 (three) times daily as needed for muscle spasms. 9 tablet 0  . busPIRone (BUSPAR) 5 MG tablet Take 1 tablet (5 mg total) by mouth 2 (two) times daily. 60 tablet 5  . Calcium Carbonate (CALCIUM 600 PO) Take 1 tablet by mouth daily.    . carboxymethylcellulose (REFRESH TEARS) 0.5 % SOLN Place 1 drop into both eyes 2 (two) times daily.     . DULoxetine (CYMBALTA) 30 MG capsule Take 1 capsule (30 mg total) by mouth 2 (two) times daily. 60  capsule 3  . fluticasone (FLONASE) 50 MCG/ACT nasal spray Place 2 sprays into both nostrils daily. 16 g 6  . HYDROcodone-acetaminophen (NORCO/VICODIN) 5-325 MG tablet Take 1 tablet by mouth every 12 (twelve) hours as needed for moderate pain. 30 tablet 0  . latanoprost (XALATAN) 0.005 % ophthalmic solution Place 1 drop into both eyes nightly.    . levothyroxine (SYNTHROID, LEVOTHROID) 200 MCG tablet Take 1 tablet (200 mcg total) by mouth daily before breakfast. 30 tablet 11  . lipase/protease/amylase (CREON)  12000 UNITS CPEP capsule Take 2 capsules (24,000 Units total) by mouth 3 (three) times daily before meals. 270 capsule 5  . lisinopril (PRINIVIL,ZESTRIL) 20 MG tablet Take 20 mg by mouth as directed. 2 TABLETS BY MOUTH DAILY    . loperamide (IMODIUM A-D) 2 MG capsule Take 2 mg by mouth as needed for diarrhea or loose stools.    . lovastatin (MEVACOR) 40 MG tablet Take 1 tablet (40 mg total) by mouth at bedtime. 30 tablet 5  . LYRICA 300 MG capsule TAKE ONE CAPSULE BY MOUTH AT BEDTIME 30 capsule 0  . methylPREDNISolone (MEDROL) 4 MG tablet 6 day taper to be taken as directed 21 tablet 0  . Multiple Vitamin (MULTIVITAMIN WITH MINERALS) TABS tablet Take 1 tablet by mouth daily.    Marland Kitchen omeprazole (PRILOSEC OTC) 20 MG tablet Take 20 mg by mouth 2 (two) times daily.     . ondansetron (ZOFRAN ODT) 4 MG disintegrating tablet Take 1 tablet (4 mg total) by mouth every 8 (eight) hours as needed for nausea or vomiting. 20 tablet 0  . polycarbophil (FIBERCON) 625 MG tablet Take 625 mg by mouth 4 (four) times daily.     . polyethylene glycol (MIRALAX / GLYCOLAX) packet Take 17 g by mouth daily as needed for mild constipation. 14 each 0  . Probiotic Product (PROBIOTIC-10 PO) Take 1 tablet by mouth 2 (two) times daily.    . psyllium (REGULOID) 0.52 g capsule Take 0.52 g by mouth daily.    Marland Kitchen rOPINIRole (REQUIP) 0.5 MG tablet Take 1 tablet (0.5 mg total) by mouth at bedtime. 30 tablet 5  . simethicone (MYLICON)  962 MG chewable tablet Chew 125 mg by mouth every 6 (six) hours as needed for flatulence.    . vitamin B-12 (CYANOCOBALAMIN) 1000 MCG tablet Take 2,000 mcg by mouth daily.    . Vitamin D, Ergocalciferol, (DRISDOL) 50000 units CAPS capsule Take 1 capsule (50,000 Units total) by mouth every 7 (seven) days. (Patient taking differently: Take 50,000 Units by mouth every 7 (seven) days. On Wednesdays) 10 capsule 0  . vitamin E 400 UNIT capsule Take 800 Units by mouth 2 (two) times daily. For tartive dyskinesia    . oxyCODONE (OXY IR/ROXICODONE) 5 MG immediate release tablet Take 5 mg by mouth as directed. TAKE 1 AND 1/2 TABLET BY MOUTH 4 TIMES A DAY AS NEEDED FOR SEVERE PAIN     Current Facility-Administered Medications  Medication Dose Route Frequency Provider Last Rate Last Dose  . ketorolac (TORADOL) 30 MG/ML injection 30 mg  30 mg Intramuscular Once Benjiman Core M, PA-C      . methylPREDNISolone acetate (DEPO-MEDROL) injection 80 mg  80 mg Intramuscular Once Lanae Crumbly, PA-C        Family History  Problem Relation Age of Onset  . Cancer Mother        Breast Cancer with Metastatic disease  . Alzheimer's disease Father   . Other Brother 1       GSW    Social History   Socioeconomic History  . Marital status: Widowed    Spouse name: Not on file  . Number of children: 2  . Years of education: 8  . Highest education level: Not on file  Social Needs  . Financial resource strain: Not on file  . Food insecurity - worry: Not on file  . Food insecurity - inability: Not on file  . Transportation needs - medical: Not on file  . Transportation needs -  non-medical: Not on file  Occupational History    Employer: RETIRED  . Occupation: Retired  Tobacco Use  . Smoking status: Never Smoker  . Smokeless tobacco: Never Used  Substance and Sexual Activity  . Alcohol use: No  . Drug use: No  . Sexual activity: Not Currently  Other Topics Concern  . 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32   Lives alone-2 steps into home   Still drives (rarely after whipple procedure)   Patient has never smoked         Hobbies: Used to enjoy square dancing would like to get back but has balance issues, housework, cooking, watch TV           REVIEW OF SYSTEMS:   [X]  denotes positive finding, [ ]  denotes negative finding Cardiac  Comments:  Chest pain or chest pressure: x   Shortness of breath upon exertion: x   Short of breath when lying flat: x   Irregular heart rhythm: x       Vascular    Pain in calf, thigh, or hip brought on by ambulation: x   Pain in feet at night that wakes you up from your sleep:  x   Blood clot in your veins:    Leg swelling:         Pulmonary    Oxygen at home:    Productive cough:     Wheezing:         Neurologic    Sudden weakness in arms or legs:     Sudden numbness in arms or legs:     Sudden onset of difficulty speaking or slurred speech: x   Temporary loss of vision in one eye:     Problems with dizziness:         Gastrointestinal    Blood in stool:     Vomited blood:         Genitourinary    Burning when urinating:     Blood in urine:        Psychiatric    Major depression:         Hematologic    Bleeding problems:    Problems with blood clotting too easily:        Skin    Rashes or ulcers:        Constitutional    Fever or chills:      PHYSICAL EXAMINATION:  Vitals:   06/22/17 1336 06/22/17 1343  BP: (!) 143/81 (!) 142/74  Pulse: 67 67  Resp: 14   Temp: 97.7 F (36.5 C)   SpO2: 95%    Vitals:   06/22/17 1336  Weight: 147 lb (66.7 kg)  Height: 5' 1"  (1.549 m)   Body mass index is 27.78 kg/m.  General:  WDWN in NAD; vital signs documented above Gait: Not observed HENT: WNL, normocephalic Pulmonary: normal non-labored  breathing , without wheezing Abdomen: soft, no masses; mild diffuse tenderness to palpation Skin: without rashes Musculoskeletal: no muscle wasting or atrophy   Neurologic: A&O X 3;  No focal weakness or paresthesias are detected Psychiatric:  The pt has Normal affect.   Non-Invasive Vascular Imaging:   CTA abdomen/pelvis 06/22/17: IMPRESSION: VASCULAR  1. Mild proximal narrowing of the  superior mesenteric artery of doubtful hemodynamic significance, and of doubtful clinical significance given the widely patent celiac axis and inferior mesenteric artery. 2. Tapered narrowing of the superior mesenteric vein just proximal to the portosplenic confluence post Whipple, without enlarged venous collaterals or other indication of hemodynamic significance.  NON-VASCULAR  1. No acute findings. 2. Postop changes of Whipple without apparent complication. 3. Sigmoid diverticulosis. 4. Broad ventral hernia containing only mesenteric fat. 5. Stable postoperative and posttraumatic orthopedic changes as above.  Pt meds includes: Statin:  Yes.   Beta Blocker:  Yes.   Aspirin:  Yes.   ACEI:  Yes.   ARB:  No. CCB use:  Yes Other Antiplatelet/Anticoagulant:  No   ASSESSMENT/PLAN:: 77 y.o. female with abdominal pain and SMA stenosis   -pt continues to have vague abdominal pain but not classic symptoms for mesenteric ischemia.  Her CTA today shows calcium in the SMA and it is difficult to determine if this is flow limiting.  She will need an arteriogram to determine this definitively.  If there is a stenosis, will try stenting at the time of procedure bc an open operation will be difficult given her hx of Whipple.   -Dr. Oneida Alar will see her back in a month after she has followed up with Dr. Oval Linsey.  The adrenal glands did not reveal any evidence of pheochromocytoma.     Leontine Locket, PA-C [facility] [phone]  Clinic MD:  Pt seen and examined with Dr.  Oneida Alar  History and exam findings as above.  CT Angio images were reviewed today.  She does have calcification at the origin of her SMA.  Although interpreted by the radiologist as no significant flow-limiting stenosis I believe that this is difficult to interpret due to the calcification.  Stenosis could be as tight as 70%.  It is difficult to know because of the overlying calcium artifact.  However, the patient's symptoms certainly do not exhibit classic chronic mesenteric ischemia symptoms.  She apparently is in the course of a workup for increased catecholamines by Dr. Oval Linsey.  I believe we should wait until this workup is complete and then we could revisit whether or not to do an arteriogram to further evaluate her superior mesenteric artery.  She will return for follow-up in 1 month.  At that time we will consider whether or not to proceed with arteriogram.  Ruta Hinds, MD [facility] [place]: [phone] Pager: [phone]

## 2017-06-22 NOTE — Progress Notes (Signed)
Vitals:   06/22/17 1336  BP: (!) 143/81  Pulse: 67  Resp: 14  Temp: 97.7 F (36.5 C)  TempSrc: Oral  SpO2: 95%  Weight: 147 lb (66.7 kg)  Height: 5\' 1"  (1.549 m)

## 2017-06-23 ENCOUNTER — Telehealth: Payer: Self-pay | Admitting: *Deleted

## 2017-06-23 DIAGNOSIS — R232 Flushing: Secondary | ICD-10-CM

## 2017-06-23 DIAGNOSIS — Z5181 Encounter for therapeutic drug level monitoring: Secondary | ICD-10-CM

## 2017-06-23 DIAGNOSIS — R7989 Other specified abnormal findings of blood chemistry: Secondary | ICD-10-CM

## 2017-06-23 DIAGNOSIS — R825 Elevated urine levels of drugs, medicaments and biological substances: Secondary | ICD-10-CM

## 2017-06-23 NOTE — Telephone Encounter (Signed)
Left message to call back  

## 2017-06-23 NOTE — Telephone Encounter (Signed)
-----   Message from Skeet Latch, MD sent at 06/21/2017  6:13 PM EST ----- Labs are abnormal.  Please refer to endocrine to determine if there may be another cause for her hypertension and flushing.

## 2017-06-26 ENCOUNTER — Encounter: Payer: Self-pay | Admitting: Cardiovascular Disease

## 2017-06-27 NOTE — Telephone Encounter (Signed)
Left message to call back   Please add HCTZ 12.5mg  daily and follow up with PharmD or APP next week. She will need a BMP at that time.     TCR  ----- Message -----  From: Fidel Levy, RN  Sent: 06/26/2017 11:08 AM  To: Skeet Latch, MD, Earvin Hansen  Subject: Non-Urgent Medical Question             ----- Message from Fidel Levy, RN sent at 06/26/2017 11:08 AM EST -----      ----- Message from [name] to Skeet Latch, MD sent at 06/26/2017 10:51 AM -----   In view of recent test results should I schedule an appointment earlier than February 18th? My mother is still having blood pressure systolic above 425 up to 956 once in a while.

## 2017-06-30 ENCOUNTER — Ambulatory Visit (INDEPENDENT_AMBULATORY_CARE_PROVIDER_SITE_OTHER): Payer: Medicare Other | Admitting: Orthopaedic Surgery

## 2017-06-30 ENCOUNTER — Encounter (INDEPENDENT_AMBULATORY_CARE_PROVIDER_SITE_OTHER): Payer: Self-pay | Admitting: Orthopaedic Surgery

## 2017-06-30 VITALS — BP 157/79 | HR 64

## 2017-06-30 DIAGNOSIS — G8929 Other chronic pain: Secondary | ICD-10-CM

## 2017-06-30 DIAGNOSIS — M545 Low back pain: Secondary | ICD-10-CM | POA: Diagnosis not present

## 2017-07-04 MED ORDER — HYDROCHLOROTHIAZIDE 12.5 MG PO CAPS: 12.5000 mg | ORAL_CAPSULE | Freq: Every day | ORAL | 5 refills | Status: DC

## 2017-07-04 NOTE — Telephone Encounter (Signed)
Advised patient and son, scheduled appointment, lab orders placed verbalized understanding

## 2017-07-08 ENCOUNTER — Other Ambulatory Visit: Payer: Self-pay | Admitting: Family Medicine

## 2017-07-10 ENCOUNTER — Other Ambulatory Visit: Payer: Self-pay | Admitting: *Deleted

## 2017-07-10 MED ORDER — LISINOPRIL 40 MG PO TABS: 40.0000 mg | ORAL_TABLET | Freq: Every day | ORAL | 1 refills | Status: DC

## 2017-07-10 NOTE — Telephone Encounter (Signed)
Needs lab recheck first. Continue vitamin D 2000 IU daily until we recheck. Not urgent.

## 2017-07-10 NOTE — Telephone Encounter (Signed)
Do you want the patient to stay on high dose?

## 2017-07-11 ENCOUNTER — Telehealth: Payer: Self-pay | Admitting: Neurology

## 2017-07-11 NOTE — Telephone Encounter (Signed)
FYI. Do you want to fit her in sooner?
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
## 2017-07-11 NOTE — Telephone Encounter (Signed)
If the patient is having new symptoms, okay to work her in earlier.  It has been about a year since she has been seen here.

## 2017-07-11 NOTE — Telephone Encounter (Signed)
Called son back. Advised since she was having c/o headaches and worsening neuropathy we would like to see her sooner. Scheduled f/u for 07/14/17 at 12pm, check in 1130am. He verbalized understanding and appreciation for call.

## 2017-07-12 ENCOUNTER — Encounter: Payer: Self-pay | Admitting: Pharmacist Clinician (PhC)/ Clinical Pharmacy Specialist

## 2017-07-12 ENCOUNTER — Ambulatory Visit (INDEPENDENT_AMBULATORY_CARE_PROVIDER_SITE_OTHER): Payer: Medicare Other | Admitting: Pharmacist Clinician (PhC)/ Clinical Pharmacy Specialist

## 2017-07-12 DIAGNOSIS — Z5181 Encounter for therapeutic drug level monitoring: Secondary | ICD-10-CM | POA: Diagnosis not present

## 2017-07-12 DIAGNOSIS — I10 Essential (primary) hypertension: Secondary | ICD-10-CM | POA: Diagnosis not present

## 2017-07-12 NOTE — Patient Instructions (Signed)
Return for a a follow up appointment Monday March 11  Your blood pressure today is 122/68  Check your blood pressure at home daily (if able) and keep record of the readings.  Take your BP meds as follows:  hydrochlorothiazie 12.5 mg daily  Amlodipine 5 mg daily  Atenolol 50 mg twice daily   Lisinopril 40 mg daily  Bring all of your meds, your BP cuff and your record of home blood pressures to your next appointment.  Exercise as you're able, try to walk approximately 30 minutes per day.  Keep salt intake to a minimum, especially watch canned and prepared boxed foods.  Eat more fresh fruits and vegetables and fewer canned items.  Avoid eating in fast food restaurants.    HOW TO TAKE YOUR BLOOD PRESSURE: . Rest 5 minutes before taking your blood pressure. .  Don't smoke or drink caffeinated beverages for at least 30 minutes before. . Take your blood pressure before (not after) you eat. . Sit comfortably with your back supported and both feet on the floor (don't cross your legs). . Elevate your arm to heart level on a table or a desk. . Use the proper sized cuff. It should fit smoothly and snugly around your bare upper arm. There should be enough room to slip a fingertip under the cuff. The bottom edge of the cuff should be 1 inch above the crease of the elbow. . Ideally, take 3 measurements at one sitting and record the average.

## 2017-07-12 NOTE — Progress Notes (Signed)
07/18/2017 [name] 21-Jun-1940 [id number]   HPI:  [name] is a 77 y.o. female patient of Dr Oval Linsey, with a [place] below who presents today for hypertension clinic evaluation.  In addition to hypertension, her medical history is significant for hyperlipidemia, mitral valve prolapse, SMA stenosis, recurrent syncope and pancreatic cancer (post pancreaticoduodenectomy).    She is here today with her son.  She has some problems with answering historical questions, but her son was able to fill in some answers.  States compliance with her medications, she takes meds 4 times per day, but cannot tell me what times of day her BP meds are.  She has poor sleep hygieine - states she goes to bed around 11pm and usually up by 5-6 am.  Her son notes that she also naps frequently throughout the day.    Patient reports no problems with chest pain, occasional LEE at night; some SOB and lightheadedness;  States she is careful about positional changes.  Her son called on Jan 25, concerned that her home readings were still occasionally going > 235 systolic.  She was given HCTZ 25 mg once daily and asked to follow up with [place] today.  We will have her get BMET today while she is here in office.   Blood Pressure Goal:  130/80  Current Medications:  hctz 12.5 mg qd - am  Amlodipine 5 mg qd - unsure if am or pm  Atenolol 50 mg bid  Lisinopril 40 mg qd - am  Family Hx:  Mother with cardiomyopathy, died from cancer in her 66  Father with seizure disorder, brain aneurysm in his 48s  1 brother with MI deceased, extreme obesity  2 sons w/o concerns  Social Hx:  No tobacco; no alcohol; coffee 1-2 cups most days; couple of cokes per day (12oz cans)  Diet:  Mostly home cooked meals, occasional eat out; likes fried foods and admits to adding salt to her food at the table.  Son states she adds it to her plate before even tasting her food.    Exercise:   Formerly used stationary bike, but sons felt it  was unsafe, as there was no back rest and it involved forward/backward motion of the arms as well.  They were afraid she would fall off, also worried that it was aggravating her back problems.    Home BP readings:  16 readings over past 2 weeks, average 157/86, range 125-193/51-105.  Patient has been checking home readings at various times, often between 10 pm and 8 am  Intolerances:   Estimated Creatinine Clearance: 51.6 mL/min (by C-G formula based on SCr of 0.71 mg/dL).  Wt Readings from Last 3 Encounters:  07/14/17 148 lb (67.1 kg)  06/22/17 147 lb (66.7 kg)  06/20/17 147 lb (66.7 kg)   BP Readings from Last 3 Encounters:  07/14/17 109/68  07/12/17 122/68  06/30/17 (!) 157/79   Pulse Readings from Last 3 Encounters:  07/14/17 69  07/12/17 72  06/30/17 64    Current Outpatient Medications  Medication Sig Dispense Refill  . acetaminophen (TYLENOL) 325 MG tablet Take 650 mg by mouth every 6 (six) hours as needed for moderate pain.    Marland Kitchen amLODipine (NORVASC) 5 MG tablet Take 1 tablet (5 mg total) by mouth daily. 30 tablet 5  . aspirin 81 MG tablet Take 81 mg by mouth daily.    Marland Kitchen atenolol (TENORMIN) 50 MG tablet Take 1 tablet (50 mg total) by mouth  2 (two) times daily. 60 tablet 5  . busPIRone (BUSPAR) 5 MG tablet Take 1 tablet (5 mg total) by mouth 2 (two) times daily. 60 tablet 5  . Calcium Carbonate (CALCIUM 600 PO) Take 1 tablet by mouth daily.    . carboxymethylcellulose (REFRESH TEARS) 0.5 % SOLN Place 1 drop into both eyes 2 (two) times daily.     . DULoxetine (CYMBALTA) 30 MG capsule Take 1 capsule (30 mg total) by mouth 2 (two) times daily. 60 capsule 3  . fluticasone (FLONASE) 50 MCG/ACT nasal spray Place 2 sprays into both nostrils daily. 16 g 6  . hydrochlorothiazide (MICROZIDE) 12.5 MG capsule Take 1 capsule (12.5 mg total) by mouth daily. 30 capsule 5  . HYDROcodone-acetaminophen (NORCO/VICODIN) 5-325 MG tablet Take 1 tablet by mouth every 12 (twelve) hours as needed  for moderate pain. 30 tablet 0  . latanoprost (XALATAN) 0.005 % ophthalmic solution Place 1 drop into both eyes nightly.    . levothyroxine (SYNTHROID, LEVOTHROID) 200 MCG tablet Take 1 tablet (200 mcg total) by mouth daily before breakfast. (Patient taking differently: Take 175 mcg by mouth daily before breakfast. ) 30 tablet 11  . lipase/protease/amylase (CREON) 12000 UNITS CPEP capsule Take 2 capsules (24,000 Units total) by mouth 3 (three) times daily before meals. 270 capsule 5  . lisinopril (PRINIVIL,ZESTRIL) 40 MG tablet Take 1 tablet (40 mg total) by mouth daily. 90 tablet 1  . loperamide (IMODIUM A-D) 2 MG capsule Take 2 mg by mouth as needed for diarrhea or loose stools.    . lovastatin (MEVACOR) 40 MG tablet Take 1 tablet (40 mg total) by mouth at bedtime. 30 tablet 5  . LYRICA 300 MG capsule TAKE ONE CAPSULE BY MOUTH AT BEDTIME (Patient taking differently: take twice a day am and pm) 30 capsule 0  . omeprazole (PRILOSEC OTC) 20 MG tablet Take 20 mg by mouth 2 (two) times daily.     . ondansetron (ZOFRAN ODT) 4 MG disintegrating tablet Take 1 tablet (4 mg total) by mouth every 8 (eight) hours as needed for nausea or vomiting. 20 tablet 0  . oxyCODONE (OXY IR/ROXICODONE) 5 MG immediate release tablet Take 5 mg by mouth as directed. TAKE 1 AND 1/2 TABLET BY MOUTH 4 TIMES A DAY AS NEEDED FOR SEVERE PAIN    . polycarbophil (FIBERCON) 625 MG tablet Take 625 mg by mouth 4 (four) times daily.     . polyethylene glycol (MIRALAX / GLYCOLAX) packet Take 17 g by mouth daily as needed for mild constipation. 14 each 0  . pregabalin (LYRICA) 100 MG capsule Take 1 capsule (100 mg total) by mouth 2 (two) times daily. 60 capsule 11  . Probiotic Product (PROBIOTIC-10 PO) Take 1 tablet by mouth 2 (two) times daily.    . psyllium (REGULOID) 0.52 g capsule Take 0.52 g by mouth daily.    Marland Kitchen rOPINIRole (REQUIP) 0.5 MG tablet Take 1 tablet (0.5 mg total) by mouth at bedtime. 30 tablet 5  . simethicone (MYLICON)  409 MG chewable tablet Chew 125 mg by mouth every 6 (six) hours as needed for flatulence.    . vitamin B-12 (CYANOCOBALAMIN) 1000 MCG tablet Take 2,000 mcg by mouth daily.    . Vitamin D, Ergocalciferol, (DRISDOL) 50000 units CAPS capsule Take 1 capsule (50,000 Units total) by mouth every 7 (seven) days. (Patient taking differently: Take 50,000 Units by mouth every 7 (seven) days. On Wednesdays) 10 capsule 0  . vitamin E 400 UNIT capsule Take 800  Units by mouth 2 (two) times daily. For tartive dyskinesia     Current Facility-Administered Medications  Medication Dose Route Frequency Provider Last Rate Last Dose  . methylPREDNISolone acetate (DEPO-MEDROL) injection 80 mg  80 mg Intramuscular Once Lanae Crumbly, PA-C        Allergies  Allergen Reactions  . Other Other (See Comments)    According to patient Trilafor and Reglan cause Tardive Dyskinesia  . Metoclopramide Hcl Other (See Comments)    Shaking; caused tardive dyskinesia  . Niacin Other (See Comments)    shaking  . Trovan [Alatrofloxacin Mesylate] Other (See Comments)    Caused shaking and nervousness  . Benzocaine-Resorcinol Rash  . Celecoxib Other (See Comments)    unknown  . Erythromycin Base Rash  . Glucosamine Other (See Comments)    unknown  . Nortriptyline Other (See Comments)    Dizziness   . Phenazopyridine Hcl Other (See Comments)    unknown  . Sulfa Antibiotics Nausea And Vomiting and Rash  . Sulfonamide Derivatives Nausea And Vomiting and Rash    Past Medical History:  Diagnosis Date  . Allergic rhinitis   . Cancer ([place]) 07/10/13   Pancreatic cancer with MRI scan 06-19-13  . Chronic maxillary sinusitis    neti pot  . Depression    alone a lot  . Eustachian tube dysfunction   . GERD (gastroesophageal reflux disease)   . Glaucoma   . Heart murmur    hx. "MVP" -predental antibiotics  . Hiatal hernia   . Hypertension   . Hypothyroid   . Memory loss    short term memory loss  . Mitral valve prolapse      antibiotics before dental procedures  . Neuropathy   . Osteoarthritis   . Stroke [facility])    mini storkes left leg paraylsis. patient denies weakness 01/08/14.   . 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 Tardive dyskinesia    possibly reglan, vitamin E helps  . Urgency of urination    some UTI in past    Blood pressure 122/68, pulse 72.  116/72 Essential hypertension Patient with hypertension, controlled in the office today.  Her home readings still indicate an uncontrolled hypertension, but she did not bring her home cuff in for verification today.   She was encouraged to move either the lisinopril or amlodipine to evenings to better balance her medications.  Patient will return in 3-4 weeks for follow up and was asked to bring her home cuff and a list of daily readings.     Tommy Medal PharmD CPP [facility] [address] [place], [place] [address] [phone]

## 2017-07-13 ENCOUNTER — Telehealth (INDEPENDENT_AMBULATORY_CARE_PROVIDER_SITE_OTHER): Payer: Self-pay | Admitting: Orthopaedic Surgery

## 2017-07-13 DIAGNOSIS — M4807 Spinal stenosis, lumbosacral region: Secondary | ICD-10-CM

## 2017-07-13 LAB — BASIC METABOLIC PANEL
BUN/Creatinine Ratio: 21 (ref 12–28)
BUN: 15 mg/dL (ref 8–27)
CALCIUM: 9.2 mg/dL (ref 8.7–10.3)
CO2: 25 mmol/L (ref 20–29)
CREATININE: 0.71 mg/dL (ref 0.57–1.00)
Chloride: 96 mmol/L (ref 96–106)
GFR calc Af Amer: 95 mL/min/{1.73_m2} (ref >59)
GFR calc non Af Amer: 82 mL/min/{1.73_m2} (ref >59)
GLUCOSE: 72 mg/dL (ref 65–99)
Potassium: 5.3 mmol/L — ABNORMAL HIGH (ref 3.5–5.2)
SODIUM: 135 mmol/L (ref 134–144)

## 2017-07-13 NOTE — Telephone Encounter (Signed)
Please advise 

## 2017-07-13 NOTE — Telephone Encounter (Signed)
Patients son called requesting a RX refill of Prednisone for his mother. He states her back pain has returned and that it had helped her before. They would like it sent in to [facility] on [address]. Please advise [name], # [phone]

## 2017-07-14 ENCOUNTER — Encounter: Payer: Self-pay | Admitting: Neurology

## 2017-07-14 ENCOUNTER — Telehealth: Payer: Self-pay

## 2017-07-14 ENCOUNTER — Ambulatory Visit (INDEPENDENT_AMBULATORY_CARE_PROVIDER_SITE_OTHER): Payer: Medicare Other | Admitting: Neurology

## 2017-07-14 VITALS — BP 109/68 | HR 69 | Ht 61.0 in | Wt 148.0 lb

## 2017-07-14 DIAGNOSIS — G609 Hereditary and idiopathic neuropathy, unspecified: Secondary | ICD-10-CM

## 2017-07-14 MED ORDER — PREGABALIN 100 MG PO CAPS: 100.0000 mg | ORAL_CAPSULE | Freq: Two times a day (BID) | ORAL | 3 refills | Status: DC

## 2017-07-14 NOTE — Patient Instructions (Signed)
   With the Lyrica 100 mg capsules, take one at night for 2 weeks, then go to 100 mg twice a day. Take with the 300 mg capsules.

## 2017-07-14 NOTE — Telephone Encounter (Signed)
Advised him sorry but I cannot give another prednisone taper.  If Dr. Lorin Mercy has not ordered a lumbar spine MRI I can do that.

## 2017-07-14 NOTE — Telephone Encounter (Signed)
I called and spoke with patient's son. I advised that per Jeneen Rinks, we cannot prescribe her anything at this time. A MRI Lumbar Spine can be ordered so that we can see what could be the possible cause of her pain.  I explained that Jeneen Rinks states if she is hurting this badly, she should go to the ED to be treated, and that this way if it was felt she needed something emergently she would have access to that.  Her son does not really want to go to the ED and stay there all day. He agreed to MRI being ordered.   Order entered into system.

## 2017-07-14 NOTE — Progress Notes (Signed)
Rx lyrica fax to pharmacy.Fax receive and confirmed.

## 2017-07-14 NOTE — Telephone Encounter (Signed)
Patient assistant application,and documents place on [employer].

## 2017-07-14 NOTE — Progress Notes (Signed)
Reason for visit: Peripheral neuropathy  [name] is an 77 y.o. female  History of present illness:  [name] is a 77 year old right-handed white female with a history of a peripheral neuropathy.  The patient has significant discomfort in the feet, she is not sleeping well.  She had been increased on the Lyrica when she was seen last in February 2018, but apparently her dose was decreased again at some point.  The patient is taking 300 mg of Lyrica twice daily, she tolerates the dose well.  She is on Cymbalta taking 30 mg twice daily.  She uses a cane for ambulation, she has not had any falls.  She has ongoing chronic daily headaches all over the head, going down into the neck.  This has been an issue for quite a number of years.  The patient reports some urinary urgency and frequency.  She is now having back pain, she is followed through orthopedic surgery, she has had lumbosacral spine surgery in the past.  She last had MRI of the low back in 2015.  Past Medical History:  Diagnosis Date  . Allergic rhinitis   . Cancer ([place]) 07/10/13   Pancreatic cancer with MRI scan 06-19-13  . Chronic maxillary sinusitis    neti pot  . Depression    alone a lot  . Eustachian tube dysfunction   . GERD (gastroesophageal reflux disease)   . Glaucoma   . Heart murmur    hx. "MVP" -predental antibiotics  . Hiatal hernia   . Hypertension   . Hypothyroid   . Memory loss    short term memory loss  . Mitral valve prolapse    antibiotics before dental procedures  . Neuropathy   . Osteoarthritis   . Stroke [facility])    mini storkes left leg paraylsis. patient denies weakness 01/08/14.   . 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 Tardive dyskinesia    possibly reglan, vitamin E helps  . Urgency of urination    some UTI in past    Past Surgical History:  Procedure Laterality Date  . 1 baker cyst removed    . ABDOMINAL HYSTERECTOMY     including ovaries  . BACK SURGERY     fusion  . BLEPHAROPLASTY Bilateral    with cataract surgery  . BREAST EXCISIONAL BIOPSY     left x2  . BREAST SURGERY     Biopsy left 2 times  . COLONOSCOPY W/ POLYPECTOMY     2004 last colonoscopy, no polyps  . DILATION AND CURETTAGE OF UTERUS     x3  . ESOPHAGOGASTRODUODENOSCOPY N/A 09/11/2013   Procedure: ESOPHAGOGASTRODUODENOSCOPY (EGD);  Surgeon: Cleotis Nipper, MD;  Location: [facility] ENDOSCOPY;  Service: Endoscopy;  Laterality: N/A;  Moderate sedation okay if MAC not available  . EUS N/A 07/10/2013   Procedure: ESOPHAGEAL ENDOSCOPIC ULTRASOUND (EUS) RADIAL;  Surgeon: Arta Silence, MD;  Location: [facility] ENDOSCOPY;  Service: Endoscopy;  Laterality: N/A;  . EYE SURGERY Right    cataract  . FINE NEEDLE ASPIRATION N/A 07/10/2013   Procedure: FINE NEEDLE ASPIRATION (FNA) LINEAR;  Surgeon: Arta Silence, MD;  Location: [facility] ENDOSCOPY;  Service: Endoscopy;  Laterality: N/A;  possible fna  . HARDWARE REMOVAL Right 12/15/2016   Procedure: Hardware removal and tenolysis right wrist with repair reconstruction as necessary;  Surgeon: Roseanne Kaufman, MD;  Location: [place];  Service: Orthopedics;  Laterality: Right;  60 mins  . JOINT REPLACEMENT     LTKA  .  KNEE SURGERY Left    x 5, total knee Left knee  . LAPAROSCOPY N/A 08/07/2013   Procedure: LAPAROSCOPY DIAGNOSTIC;  Surgeon: [name], MD;  Location: [place];  Service: General;  Laterality: N/A;  . LUMBAR SPINE SURGERY     x2  . LUMBAR SPINE SURGERY     cyst  . ORIF ANKLE FRACTURE Right 08/16/2015   Procedure: OPEN REDUCTION INTERNAL FIXATION (ORIF) ANKLE FRACTURE;  Surgeon: Meredith Pel, MD;  Location: [place];  Service: Orthopedics;  Laterality: Right;  . ORIF WRIST FRACTURE Right 11/01/2016   Procedure: OPEN REDUCTION INTERNAL FIXATION (ORIF) RIGHT WRIST FRACTURE WITH APPLICATION OF SPANNING PLATE, IRRIGATION AND DEBRIDEMENT RIGHT WRIST;  Surgeon: Roseanne Kaufman, MD;  Location: [facility];  Service: Orthopedics;  Laterality: Right;  .  RADIOACTIVE SEED GUIDED EXCISIONAL BREAST BIOPSY Left 12/15/2015   Procedure: LEFT RADIOACTIVE SEED GUIDED EXCISIONAL BREAST BIOPSY;  Surgeon: [name], MD;  Location: [place];  Service: General;  Laterality: Left;  . WHIPPLE PROCEDURE N/A 08/07/2013   Procedure: WHIPPLE PROCEDURE;  Surgeon: [name], MD;  Location: [facility] OR;  Service: General;  Laterality: N/A;    Family History  Problem Relation Age of Onset  . Cancer Mother        Breast Cancer with Metastatic disease  . Alzheimer's disease Father   . Other Brother 54       GSW    Social history:  reports that  has never smoked. she has never used smokeless tobacco. She reports that she does not drink alcohol or use drugs.    Allergies  Allergen Reactions  . Other Other (See Comments)    According to patient Trilafor and Reglan cause Tardive Dyskinesia  . Metoclopramide Hcl Other (See Comments)    Shaking; caused tardive dyskinesia  . Niacin Other (See Comments)    shaking  . Trovan [Alatrofloxacin Mesylate] Other (See Comments)    Caused shaking and nervousness  . Benzocaine-Resorcinol Rash  . Celecoxib Other (See Comments)    unknown  . Erythromycin Base Rash  . Glucosamine Other (See Comments)    unknown  . Nortriptyline Other (See Comments)    Dizziness   . Phenazopyridine Hcl Other (See Comments)    unknown  . Sulfa Antibiotics Nausea And Vomiting and Rash  . Sulfonamide Derivatives Nausea And Vomiting and Rash    Medications:  Prior to Admission medications   Medication Sig Start Date End Date Taking? Authorizing Provider  acetaminophen (TYLENOL) 325 MG tablet Take 650 mg by mouth every 6 (six) hours as needed for moderate pain.   Yes [provider]  amLODipine (NORVASC) 5 MG tablet Take 1 tablet (5 mg total) by mouth daily. 05/11/17  Yes Briscoe Deutscher, DO  aspirin 81 MG tablet Take 81 mg by mouth daily.   Yes [provider]  atenolol (TENORMIN) 50 MG tablet Take 1 tablet (50 mg total) by  mouth 2 (two) times daily. 06/13/17  Yes Skeet Latch, MD  busPIRone (BUSPAR) 5 MG tablet Take 1 tablet (5 mg total) by mouth 2 (two) times daily. 06/02/17  Yes Briscoe Deutscher, DO  Calcium Carbonate (CALCIUM 600 PO) Take 1 tablet by mouth daily.   Yes [provider]  carboxymethylcellulose (REFRESH TEARS) 0.5 % SOLN Place 1 drop into both eyes 2 (two) times daily.    Yes [provider]  DULoxetine (CYMBALTA) 30 MG capsule Take 1 capsule (30 mg total) by mouth 2 (two) times daily. 05/11/17  Yes Briscoe Deutscher,  DO  fluticasone (FLONASE) 50 MCG/ACT nasal spray Place 2 sprays into both nostrils daily. 07/29/16  Yes Marin Olp, MD  hydrochlorothiazide (MICROZIDE) 12.5 MG capsule Take 1 capsule (12.5 mg total) by mouth daily. 07/04/17  Yes Skeet Latch, MD  HYDROcodone-acetaminophen (NORCO/VICODIN) 5-325 MG tablet Take 1 tablet by mouth every 12 (twelve) hours as needed for moderate pain. 06/20/17  Yes Lanae Crumbly, PA-C  latanoprost (XALATAN) 0.005 % ophthalmic solution Place 1 drop into both eyes nightly. 08/07/15  Yes [provider]  levothyroxine (SYNTHROID, LEVOTHROID) 200 MCG tablet Take 1 tablet (200 mcg total) by mouth daily before breakfast. Patient taking differently: Take 175 mcg by mouth daily before breakfast.  05/11/17 05/11/18 Yes Briscoe Deutscher, DO  lipase/protease/amylase (CREON) 12000 UNITS CPEP capsule Take 2 capsules (24,000 Units total) by mouth 3 (three) times daily before meals. 05/25/14  Yes Michael Boston, MD  lisinopril (PRINIVIL,ZESTRIL) 40 MG tablet Take 1 tablet (40 mg total) by mouth daily. 07/10/17 10/08/17 Yes Skeet Latch, MD  loperamide (IMODIUM A-D) 2 MG capsule Take 2 mg by mouth as needed for diarrhea or loose stools.   Yes [provider]  lovastatin (MEVACOR) 40 MG tablet Take 1 tablet (40 mg total) by mouth at bedtime. 05/11/17  Yes Briscoe Deutscher, DO  LYRICA 300 MG capsule TAKE ONE CAPSULE BY MOUTH AT  BEDTIME Patient taking differently: take twice a day am and pm 06/12/17  Yes Briscoe Deutscher, DO  omeprazole (PRILOSEC OTC) 20 MG tablet Take 20 mg by mouth 2 (two) times daily.    Yes [provider]  ondansetron (ZOFRAN ODT) 4 MG disintegrating tablet Take 1 tablet (4 mg total) by mouth every 8 (eight) hours as needed for nausea or vomiting. 03/20/17  Yes Briscoe Deutscher, DO  polycarbophil (FIBERCON) 625 MG tablet Take 625 mg by mouth 4 (four) times daily.    Yes [provider]  polyethylene glycol (MIRALAX / GLYCOLAX) packet Take 17 g by mouth daily as needed for mild constipation. 01/11/17  Yes Patrecia Pour, MD  Probiotic Product (PROBIOTIC-10 PO) Take 1 tablet by mouth 2 (two) times daily.   Yes [provider]  psyllium (REGULOID) 0.52 g capsule Take 0.52 g by mouth daily.   Yes [provider]  rOPINIRole (REQUIP) 0.5 MG tablet Take 1 tablet (0.5 mg total) by mouth at bedtime. 05/11/17  Yes Briscoe Deutscher, DO  simethicone (MYLICON) 762 MG chewable tablet Chew 125 mg by mouth every 6 (six) hours as needed for flatulence.   Yes [provider]  vitamin B-12 (CYANOCOBALAMIN) 1000 MCG tablet Take 2,000 mcg by mouth daily.   Yes [provider]  Vitamin D, Ergocalciferol, (DRISDOL) 50000 units CAPS capsule Take 1 capsule (50,000 Units total) by mouth every 7 (seven) days. Patient taking differently: Take 50,000 Units by mouth every 7 (seven) days. On Wednesdays 10/31/16  Yes Briscoe Deutscher, DO  vitamin E 400 UNIT capsule Take 800 Units by mouth 2 (two) times daily. For tartive dyskinesia   Yes [provider]  oxyCODONE (OXY IR/ROXICODONE) 5 MG immediate release tablet Take 5 mg by mouth as directed. TAKE 1 AND 1/2 TABLET BY MOUTH 4 TIMES A DAY AS NEEDED FOR SEVERE PAIN    [provider]  pregabalin (LYRICA) 100 MG capsule Take 1 capsule (100 mg total) by mouth 2 (two) times daily. 07/14/17   Kathrynn Ducking, MD    ROS:  Out  of a complete 14 system review of symptoms,  the patient complains only of the following symptoms, and all other reviewed systems are negative.  Hearing loss, ear pain, ringing in the ears, runny nose Excessive sweating Cough, wheezing, shortness of breath Chest pain, leg swelling, palpitations of the heart, heart murmur Heat intolerance, excessive thirst Abdominal pain, nausea Restless legs, frequent waking, daytime sleepiness, snoring  Blood pressure 109/68, pulse 69, height 5' 1"  (1.549 m), weight 148 lb (67.1 kg).  Physical Exam  General: The patient is alert and cooperative at the time of the examination.  Skin: No significant peripheral edema is noted.   Neurologic Exam  Mental status: The patient is alert and oriented x 3 at the time of the examination. The patient has apparent normal recent and remote memory, with an apparently normal attention span and concentration ability.   Cranial nerves: Facial symmetry is present. Speech is normal, no aphasia or dysarthria is noted. Extraocular movements are full. Visual fields are full.   Motor: The patient has good strength in all 4 extremities.  Sensory examination: Soft touch sensation is symmetric on the face, arms, and legs.  Coordination: The patient has good finger-nose-finger and heel-to-shin bilaterally.  Gait and station: The patient has a slightly wide-based gait, the patient usually uses a cane for ambulation. Tandem gait is slightly unsteady. Romberg is negative, but is unsteady. No drift is seen.  Reflexes: Deep tendon reflexes are symmetric, but are slightly depressed.   Assessment/Plan:  1.  Peripheral neuropathy  2.  Chronic low back pain  3.  Chronic daily headache  The patient continues to have ongoing issues with neuropathy that is keeping her awake at night.  We will go back up on the Lyrica taking 100 mg twice daily added to the 300 mg capsules, she will start 100 mg at night for a week and then go to  100 mg twice daily.  The patient will call in several weeks if she is not doing well, we will increase the Cymbalta.  She will follow-up in about 4 months.  Jill Alexanders MD 07/14/2017 12:14 PM  [facility] [address] [place], [place] [address]  Phone [phone] Fax [phone]

## 2017-07-14 NOTE — Telephone Encounter (Signed)
Patient's son called again this morning in regards to the medication being called in for his mother.  He stated that she is hurting pretty bad and needs something for her today if possible.  CB#[phone].  Thank you.

## 2017-07-17 ENCOUNTER — Ambulatory Visit: Payer: Medicare Other

## 2017-07-17 ENCOUNTER — Other Ambulatory Visit: Payer: Self-pay | Admitting: *Deleted

## 2017-07-17 MED ORDER — PREGABALIN 100 MG PO CAPS: 100.0000 mg | ORAL_CAPSULE | Freq: Two times a day (BID) | ORAL | 11 refills | Status: DC

## 2017-07-17 NOTE — Telephone Encounter (Signed)
Terrence Dupont Will get Dr. Jannifer Franklin to Sign application and Print hard copy RX. Thanks Hinton Dyer

## 2017-07-17 NOTE — Telephone Encounter (Signed)
Signed application and rx hard copy placed back on Dana [facility]. Desk to be processed.

## 2017-07-18 NOTE — Assessment & Plan Note (Addendum)
Patient with hypertension, controlled in the office today.  Her home readings still indicate an uncontrolled hypertension, but she did not bring her home cuff in for verification today.   She was encouraged to move either the lisinopril or amlodipine to evenings to better balance her medications.  Patient will return in 3-4 weeks for follow up and was asked to bring her home cuff and a list of daily readings.

## 2017-07-18 NOTE — Telephone Encounter (Signed)
Sent to [employer] telephone [phone] patient assistance for Lyrica 100 mg .

## 2017-07-20 ENCOUNTER — Ambulatory Visit: Payer: Medicare Other | Admitting: Vascular Surgery

## 2017-07-26 ENCOUNTER — Telehealth: Payer: Self-pay | Admitting: Neurology

## 2017-07-26 NOTE — Telephone Encounter (Signed)
Called patient's son and left him a message asking him to call me about his mother's Ly rica .  Terrence Dupont RN and Dr. Jannifer Franklin spoke .  See Below.  I have also contacted [facility] and made them aware . Telephone [phone] .    We will go back up on the Lyrica taking 100 mg twice daily added to the 300 mg capsules, she will start 100 mg at night for a week and then go to 100 mg twice daily.  The patient will call in several weeks if she is not doing well, we will increase the Cymbalta.  She will follow-up in about 4 .

## 2017-07-27 MED ORDER — PREGABALIN 300 MG PO CAPS: 300.0000 mg | ORAL_CAPSULE | Freq: Two times a day (BID) | ORAL | 1 refills | Status: DC

## 2017-07-27 NOTE — Addendum Note (Signed)
Addended by: Kathrynn Ducking on: 07/27/2017 01:51 PM   Modules accepted: Orders

## 2017-07-27 NOTE — Telephone Encounter (Signed)
We will send in a refill for the Lyrica 300 mg capsules.

## 2017-07-27 NOTE — Telephone Encounter (Signed)
We will take over the prescription for the 300 mg Lyrica capsules, I will write a prescription.

## 2017-07-27 NOTE — Telephone Encounter (Signed)
Patient son is calling back stating his mother is still need another script for  For Lyrica  300 mg.  Patient assistance.   Terrence Dupont Can you please call son and speak to him about this Rx . Thanks Hinton Dyer

## 2017-07-29 ENCOUNTER — Other Ambulatory Visit: Payer: Self-pay | Admitting: Family Medicine

## 2017-07-30 ENCOUNTER — Other Ambulatory Visit: Payer: Medicare Other

## 2017-08-01 ENCOUNTER — Other Ambulatory Visit: Payer: Self-pay

## 2017-08-01 ENCOUNTER — Telehealth: Payer: Self-pay | Admitting: Family Medicine

## 2017-08-01 MED ORDER — LOVASTATIN 40 MG PO TABS: 40.0000 mg | ORAL_TABLET | Freq: Every day | ORAL | 2 refills | Status: DC

## 2017-08-01 MED ORDER — ATENOLOL 50 MG PO TABS: 50.0000 mg | ORAL_TABLET | Freq: Two times a day (BID) | ORAL | 2 refills | Status: DC

## 2017-08-01 NOTE — Telephone Encounter (Signed)
Copied from [place]. Topic: Inquiry >> Aug 01, 2017 10:11 AM Pricilla Handler wrote: Reason for CRM: Patient called requesting a refill of Levothyroxine (SYNTHROID, LEVOTHROID) 200 MCG tablet. Patient called her Art gallery manager. They stated that she is out of refills. Patient's preferred pharmacy is [place], [place] [employer] [phone] (Phone) [phone] (Fax)

## 2017-08-02 ENCOUNTER — Other Ambulatory Visit: Payer: Self-pay

## 2017-08-02 NOTE — Telephone Encounter (Addendum)
Relation to pt: self Call back [phone]  Reason for call:  [facility] unable to transfer levothyroxine (SYNTHROID, LEVOTHROID) 200 MCG tablet stating they dont have 11 refills on file. Patient requesting new prescription please send directly to    [place], [place] [phone] (Phone) [phone] (Fax)

## 2017-08-02 NOTE — Telephone Encounter (Signed)
Pt. Will have [facility] transfer [facility]ills from [facility]. Will call back if she has problems.

## 2017-08-04 ENCOUNTER — Ambulatory Visit
Admission: RE | Admit: 2017-08-04 | Discharge: 2017-08-04 | Disposition: A | Discharge status: Home / Self Care | Payer: Medicare Other | Source: Ambulatory Visit | Attending: Surgery | Admitting: Surgery

## 2017-08-04 ENCOUNTER — Other Ambulatory Visit: Payer: Self-pay | Admitting: Family Medicine

## 2017-08-04 DIAGNOSIS — M4807 Spinal stenosis, lumbosacral region: Secondary | ICD-10-CM

## 2017-08-04 DIAGNOSIS — M48061 Spinal stenosis, lumbar region without neurogenic claudication: Secondary | ICD-10-CM | POA: Diagnosis not present

## 2017-08-04 NOTE — Telephone Encounter (Signed)
Sent to office for review- patient has 4 pills left and needs Rx sent to [facility].

## 2017-08-04 NOTE — Telephone Encounter (Signed)
See note

## 2017-08-04 NOTE — Telephone Encounter (Signed)
Copied from [place]. Topic: Quick Communication - Rx Refill/Question >> Aug 04, 2017  9:26 AM Lolita Rieger, RMA wrote: Medication: vit d 50000un   Has the patient contacted their pharmacy?yes   (Agent: If no, request that the patient contact the pharmacy for the refill.)   Preferred Pharmacy (with phone number or street name): [facility]   Agent: Please be advised that RX refills may take up to 3 business days. We ask that you follow-up with your pharmacy.

## 2017-08-04 NOTE — Telephone Encounter (Signed)
See note. Please clarify.

## 2017-08-04 NOTE — Telephone Encounter (Signed)
Per [name] previous message patient Needs lab recheck first. Continue vitamin D 2000 IU daily until we recheck. Not urgent. I have called the patient and let her know.

## 2017-08-04 NOTE — Telephone Encounter (Signed)
Vitamin D 50000 refill request  LOV 05/10/17 with Dr. Ardeen [facility] [place].

## 2017-08-04 NOTE — Telephone Encounter (Signed)
Pt said does she take the vitamin D 2000 once a week or once a day?

## 2017-08-04 NOTE — Telephone Encounter (Signed)
Patient said [facility] does not have levothyroxine (SYNTHROID, LEVOTHROID) 200 MCG tablet. Patient said please re-send

## 2017-08-04 NOTE — Telephone Encounter (Signed)
Call to patient to verify exactly what she needs- she states her bottle states 0.175 mcg #90 and that is what she is using presently.  Call to office to clarify dosing for patient- not finding the change in her chart.

## 2017-08-05 ENCOUNTER — Other Ambulatory Visit: Payer: Medicare Other

## 2017-08-06 NOTE — Telephone Encounter (Signed)
200 mcg is correct. We will recheck labs at her next visit this week.

## 2017-08-07 ENCOUNTER — Ambulatory Visit (INDEPENDENT_AMBULATORY_CARE_PROVIDER_SITE_OTHER): Payer: Medicare Other | Admitting: Pharmacist

## 2017-08-07 ENCOUNTER — Telehealth: Payer: Self-pay | Admitting: Family Medicine

## 2017-08-07 VITALS — BP 130/68 | HR 64

## 2017-08-07 DIAGNOSIS — I10 Essential (primary) hypertension: Secondary | ICD-10-CM | POA: Diagnosis not present

## 2017-08-07 NOTE — Telephone Encounter (Signed)
Copied from [place] [id number]. Topic: Quick Communication - See Telephone Encounter >> Aug 07, 2017 10:37 AM Aurelio Brash B wrote: CRM for notification. See Telephone encounter for:  Kyung Rudd wants a call back to confirm  medications - Vit D and levothyroxine   Ref # [id number]    Call back # [phone] 08/07/17.  [place], [place] [employer] [phone] (Phone) [phone] (Fax)

## 2017-08-07 NOTE — Telephone Encounter (Signed)
Refills done as requested 

## 2017-08-07 NOTE — Telephone Encounter (Signed)
Notified patient that she should be taking the Vitamin D 2000 daily.

## 2017-08-07 NOTE — Progress Notes (Signed)
HPI:  [name] is a 77 y.o. female patient of Dr Oval Linsey, with a [employer] below who presents today for hypertension clinic follow up.  In addition to hypertension, her medical history is significant for hyperlipidemia, mitral valve prolapse, SMA stenosis, recurrent syncope and pancreatic cancer (post pancreaticoduodenectomy).   She has poor sleep hygieine - states she goes to bed around 11pm and usually up by 5-6 am.  Her son notes that she also naps frequently throughout the day but the neuropathic pain keep her up most of the night.  Patient reports denies chest pain, and reports some SOB and lightheadedness;  States she is careful about positional changes.  During most recent office visit patient was instructed to change amlodipine to evening dose but patient never adjusted her administration times.  Blood Pressure Goal:  130/80  Current Medications:  Amlodipine 5mg  every morning  Atenolol 50mg  twice daily (6am and 6pm)  HCTZ 12.5mg  daily every morning  Lisinopril 40mg  daily every morning  Family Hx:  Mother with cardiomyopathy, died from cancer in her 54  Father with seizure disorder, brain aneurysm in his 12s  1 brother with MI deceased, extreme obesity  2 sons w/o concerns  Social Hx:  No tobacco; no alcohol; coffee 1-2 cups most days; couple of cokes per day (12oz cans)  Diet:  Mostly home cooked meals, occasional eat out; likes fried foods and admits to adding salt to her food at the table.  Son states she adds it to her plate before even tasting her food.    Exercise:   Formerly used stationary bike, but sons felt it was unsafe, as there was no back rest and it involved forward/backward motion of the arms as well.  They were afraid she would fall off, also worried that it was aggravating her back problems.    Home BP readings:   20 readings; average 130/74 (HR 65-78 bpm) Omron wrist about 78 years old; accurate within 43mm/Hg when appropriate technique used   Wt  Readings from Last 3 Encounters:  08/09/17 151 lb (68.5 kg)  08/08/17 150 lb (68 kg)  07/14/17 148 lb (67.1 kg)   BP Readings from Last 3 Encounters:  08/09/17 140/88  08/08/17 (!) 142/78  08/07/17 130/68   Pulse Readings from Last 3 Encounters:  08/09/17 69  08/08/17 63  08/07/17 64    Current Outpatient Medications  Medication Sig Dispense Refill  . acetaminophen (TYLENOL) 325 MG tablet Take 650 mg by mouth every 6 (six) hours as needed.    Marland Kitchen amLODipine (NORVASC) 5 MG tablet Take 1 tablet (5 mg total) by mouth daily. 30 tablet 5  . aspirin 81 MG tablet Take 81 mg by mouth daily.    Marland Kitchen atenolol (TENORMIN) 50 MG tablet Take 1 tablet (50 mg total) by mouth 2 (two) times daily. 180 tablet 2  . busPIRone (BUSPAR) 5 MG tablet Take 1 tablet (5 mg total) by mouth 2 (two) times daily. 60 tablet 5  . Calcium Carbonate (CALCIUM 600 PO) Take 1 tablet by mouth daily.    . carboxymethylcellulose (REFRESH TEARS) 0.5 % SOLN Place 1 drop into both eyes 2 (two) times daily.     . DULoxetine (CYMBALTA) 30 MG capsule Take 1 capsule (30 mg total) by mouth 2 (two) times daily. 60 capsule 3  . fluticasone (FLONASE) 50 MCG/ACT nasal spray Place 2 sprays into both nostrils daily. 16 g 6  . hydrochlorothiazide (MICROZIDE) 12.5 MG capsule Take 1 capsule (  12.5 mg total) by mouth daily. 30 capsule 5  . HYDROcodone-acetaminophen (NORCO/VICODIN) 5-325 MG tablet Take 1 tablet by mouth every 12 (twelve) hours as needed for moderate pain. 30 tablet 0  . latanoprost (XALATAN) 0.005 % ophthalmic solution Place 1 drop into both eyes nightly.    . levothyroxine (SYNTHROID, LEVOTHROID) 175 MCG tablet Take 1 tablet (175 mcg total) by mouth daily before breakfast. 90 tablet 3  . lipase/protease/amylase (CREON) 12000 UNITS CPEP capsule Take 2 capsules (24,000 Units total) by mouth 3 (three) times daily before meals. 270 capsule 5  . lisinopril (PRINIVIL,ZESTRIL) 40 MG tablet Take 1 tablet (40 mg total) by mouth daily. 90  tablet 1  . lovastatin (MEVACOR) 40 MG tablet Take 1 tablet (40 mg total) by mouth at bedtime. 90 tablet 2  . omeprazole (PRILOSEC OTC) 20 MG tablet Take 20 mg by mouth 2 (two) times daily.     . ondansetron (ZOFRAN) 4 MG tablet Take 4 mg by mouth every 8 (eight) hours as needed for nausea or vomiting.    . polycarbophil (FIBERCON) 625 MG tablet Take 625 mg by mouth 4 (four) times daily.     . pregabalin (LYRICA) 100 MG capsule Take 1 capsule (100 mg total) by mouth 2 (two) times daily. 60 capsule 11  . pregabalin (LYRICA) 300 MG capsule Take 1 capsule (300 mg total) by mouth 2 (two) times daily. 180 capsule 1  . Probiotic Product (PROBIOTIC-10 PO) Take 1 tablet by mouth 2 (two) times daily.    . psyllium (REGULOID) 0.52 g capsule Take 0.52 g by mouth daily.    Marland Kitchen rOPINIRole (REQUIP) 0.5 MG tablet Take 1 tablet (0.5 mg total) by mouth at bedtime. 30 tablet 5  . vitamin E 400 UNIT capsule Take 800 Units by mouth 2 (two) times daily. For tartive dyskinesia     No current facility-administered medications for this visit.     Allergies  Allergen Reactions  . Other Other (See Comments)    According to patient Trilafor and Reglan cause Tardive Dyskinesia  . Metoclopramide Hcl Other (See Comments)    Shaking; caused tardive dyskinesia  . Niacin Other (See Comments)    shaking  . Trovan [Alatrofloxacin Mesylate] Other (See Comments)    Caused shaking and nervousness  . Benzocaine-Resorcinol Rash  . Celecoxib Other (See Comments)    unknown  . Erythromycin Base Rash  . Glucosamine Other (See Comments)    unknown  . Nortriptyline Other (See Comments)    Dizziness   . Phenazopyridine Hcl Other (See Comments)    unknown  . Sulfa Antibiotics Nausea And Vomiting and Rash  . Sulfonamide Derivatives Nausea And Vomiting and Rash    Past Medical History:  Diagnosis Date  . Abnormality of gait 03/27/2014  . Allergic rhinitis   . Ankle fracture, bimalleolar, closed 08/16/2015  . B12 deficiency  08/08/2017  . Cancer ([place]) 07/10/13   Pancreatic cancer with MRI scan 06-19-13  . Chronic maxillary sinusitis    neti pot  . Closed fracture of ramus of right pubis ([place]) 11/22/2016  . Depression    alone a lot  . Eustachian tube dysfunction   . GERD (gastroesophageal reflux disease)   . Glaucoma   . Heart murmur    hx. "MVP" -predental antibiotics  . Hiatal hernia   . Hypertension   . Hypothyroid   . Memory loss    short term memory loss  . Mitral valve prolapse    antibiotics before dental procedures  .  Neuropathy   . Open Colles' fracture of right radius 11/02/2016  . Osteoarthritis   . Sacral fracture, closed ([place]) 01/07/2017  . Stroke [facility])    mini storkes left leg paraylsis. patient denies weakness 01/08/14.   . 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 Tardive dyskinesia    possibly reglan, vitamin E helps  . Urgency of urination    some UTI in past    Blood pressure 130/68, pulse 64, SpO2 99 %.   Essential hypertension Blood pressure well controlled during office visits but few systolic pressures >009 mm/Hg noted in the evening hours . Patient never changed her amlodipine to evening administration as previously recomended. She denies problems with current therapy and main complain is related to neuropathy management.   Will change amlodipine to evening administration. Proper technique to use her home meter given and discussed with patient and son. Plan to follow up in 4 weeks and re-assess need for additional medication.   Tray Klayman Rodriguez-Guzman PharmD, BCPS, CPP [place] [address] [place] [address] 08/10/2017 11:10 AM

## 2017-08-07 NOTE — Telephone Encounter (Signed)
Notified patient.

## 2017-08-07 NOTE — Patient Instructions (Addendum)
Return for a a follow up appointment in 6 weeks  Check your blood pressure at home daily (if able) and keep record of the readings.  Take your BP meds as follows: *TAKE amlodipine 5mg  with 2nd dose of atenolol (around 6pm)*   Bring all of your meds, your BP cuff and your record of home blood pressures to your next appointment.  Exercise as you're able, try to walk approximately 30 minutes per day.  Keep salt intake to a minimum, especially watch canned and prepared boxed foods.  Eat more fresh fruits and vegetables and fewer canned items.  Avoid eating in fast food restaurants.    HOW TO TAKE YOUR BLOOD PRESSURE: . Rest 5 minutes before taking your blood pressure. .  Don't smoke or drink caffeinated beverages for at least 30 minutes before. . Take your blood pressure before (not after) you eat. . Sit comfortably with your back supported and both feet on the floor (don't cross your legs). . Elevate your arm to heart level on a table or a desk. . Use the proper sized cuff. It should fit smoothly and snugly around your bare upper arm. There should be enough room to slip a fingertip under the cuff. The bottom edge of the cuff should be 1 inch above the crease of the elbow. . Ideally, take 3 measurements at one sitting and record the average.

## 2017-08-08 ENCOUNTER — Encounter: Payer: Self-pay | Admitting: Family Medicine

## 2017-08-08 ENCOUNTER — Encounter (INDEPENDENT_AMBULATORY_CARE_PROVIDER_SITE_OTHER): Payer: Self-pay | Admitting: Orthopaedic Surgery

## 2017-08-08 ENCOUNTER — Ambulatory Visit (INDEPENDENT_AMBULATORY_CARE_PROVIDER_SITE_OTHER): Payer: Medicare Other | Admitting: Family Medicine

## 2017-08-08 VITALS — BP 142/78 | HR 63 | Ht 61.0 in | Wt 150.0 lb

## 2017-08-08 DIAGNOSIS — E538 Deficiency of other specified B group vitamins: Secondary | ICD-10-CM | POA: Insufficient documentation

## 2017-08-08 DIAGNOSIS — I341 Nonrheumatic mitral (valve) prolapse: Secondary | ICD-10-CM | POA: Diagnosis not present

## 2017-08-08 DIAGNOSIS — C250 Malignant neoplasm of head of pancreas: Secondary | ICD-10-CM | POA: Diagnosis not present

## 2017-08-08 DIAGNOSIS — J301 Allergic rhinitis due to pollen: Secondary | ICD-10-CM

## 2017-08-08 DIAGNOSIS — F4323 Adjustment disorder with mixed anxiety and depressed mood: Secondary | ICD-10-CM | POA: Diagnosis not present

## 2017-08-08 DIAGNOSIS — S3210XG Unspecified fracture of sacrum, subsequent encounter for fracture with delayed healing: Secondary | ICD-10-CM

## 2017-08-08 DIAGNOSIS — G2401 Drug induced subacute dyskinesia: Secondary | ICD-10-CM

## 2017-08-08 DIAGNOSIS — K529 Noninfective gastroenteritis and colitis, unspecified: Secondary | ICD-10-CM | POA: Diagnosis not present

## 2017-08-08 DIAGNOSIS — E782 Mixed hyperlipidemia: Secondary | ICD-10-CM | POA: Diagnosis not present

## 2017-08-08 DIAGNOSIS — K8681 Exocrine pancreatic insufficiency: Secondary | ICD-10-CM

## 2017-08-08 DIAGNOSIS — M48061 Spinal stenosis, lumbar region without neurogenic claudication: Secondary | ICD-10-CM

## 2017-08-08 DIAGNOSIS — M150 Primary generalized (osteo)arthritis: Secondary | ICD-10-CM

## 2017-08-08 DIAGNOSIS — K635 Polyp of colon: Secondary | ICD-10-CM

## 2017-08-08 DIAGNOSIS — R413 Other amnesia: Secondary | ICD-10-CM

## 2017-08-08 DIAGNOSIS — I10 Essential (primary) hypertension: Secondary | ICD-10-CM

## 2017-08-08 DIAGNOSIS — E039 Hypothyroidism, unspecified: Secondary | ICD-10-CM

## 2017-08-08 DIAGNOSIS — M159 Polyosteoarthritis, unspecified: Secondary | ICD-10-CM

## 2017-08-08 DIAGNOSIS — J320 Chronic maxillary sinusitis: Secondary | ICD-10-CM | POA: Diagnosis not present

## 2017-08-08 DIAGNOSIS — M810 Age-related osteoporosis without current pathological fracture: Secondary | ICD-10-CM

## 2017-08-08 DIAGNOSIS — C801 Malignant (primary) neoplasm, unspecified: Secondary | ICD-10-CM

## 2017-08-08 DIAGNOSIS — G609 Hereditary and idiopathic neuropathy, unspecified: Secondary | ICD-10-CM

## 2017-08-08 HISTORY — DX: Deficiency of other specified B group vitamins: E53.8

## 2017-08-08 LAB — COMPREHENSIVE METABOLIC PANEL
ALT: 25 U/L (ref 0–35)
AST: 24 U/L (ref 0–37)
Albumin: 4.1 g/dL (ref 3.5–5.2)
Alkaline Phosphatase: 128 U/L — ABNORMAL HIGH (ref 39–117)
BUN: 18 mg/dL (ref 6–23)
CO2: 29 mEq/L (ref 19–32)
Calcium: 9.3 mg/dL (ref 8.4–10.5)
Chloride: 99 mEq/L (ref 96–112)
Creatinine, Ser: 0.74 mg/dL (ref 0.40–1.20)
GFR: 80.68 mL/min (ref >60.00)
Glucose, Bld: 93 mg/dL (ref 70–99)
Potassium: 4.7 mEq/L (ref 3.5–5.1)
Sodium: 136 mEq/L (ref 135–145)
Total Bilirubin: 0.5 mg/dL (ref 0.2–1.2)
Total Protein: 6.3 g/dL (ref 6.0–8.3)

## 2017-08-08 LAB — CBC WITH DIFFERENTIAL/PLATELET
Basophils Absolute: 0 10*3/uL (ref 0.0–0.1)
Basophils Relative: 0.5 % (ref 0.0–3.0)
Eosinophils Absolute: 0.2 10*3/uL (ref 0.0–0.7)
Eosinophils Relative: 2.2 % (ref 0.0–5.0)
HCT: 37.7 % (ref 36.0–46.0)
Hemoglobin: 12.9 g/dL (ref 12.0–15.0)
Lymphocytes Relative: 21.6 % (ref 12.0–46.0)
Lymphs Abs: 1.6 10*3/uL (ref 0.7–4.0)
MCHC: 34.3 g/dL (ref 30.0–36.0)
MCV: 95.2 fl (ref 78.0–100.0)
Monocytes Absolute: 0.6 10*3/uL (ref 0.1–1.0)
Monocytes Relative: 8.3 % (ref 3.0–12.0)
Neutro Abs: 4.9 10*3/uL (ref 1.4–7.7)
Neutrophils Relative %: 67.4 % (ref 43.0–77.0)
Platelets: 158 10*3/uL (ref 150.0–400.0)
RBC: 3.96 Mil/uL (ref 3.87–5.11)
RDW: 16.1 % — ABNORMAL HIGH (ref 11.5–15.5)
WBC: 7.3 10*3/uL (ref 4.0–10.5)

## 2017-08-08 LAB — T4, FREE: Free T4: 0.65 ng/dL (ref 0.60–1.60)

## 2017-08-08 LAB — LIPID PANEL
Cholesterol: 127 mg/dL (ref 0–200)
HDL: 52.8 mg/dL (ref >39.00)
LDL Cholesterol: 37 mg/dL (ref 0–99)
NonHDL: 73.72
Total CHOL/HDL Ratio: 2
Triglycerides: 184 mg/dL — ABNORMAL HIGH (ref 0.0–149.0)
VLDL: 36.8 mg/dL (ref 0.0–40.0)

## 2017-08-08 LAB — TSH: TSH: 4.3 u[IU]/mL (ref 0.35–4.50)

## 2017-08-08 LAB — VITAMIN B12: Vitamin B-12: 667 pg/mL (ref 211–911)

## 2017-08-08 MED ORDER — LEVOTHYROXINE SODIUM 175 MCG PO TABS: 175.0000 ug | ORAL_TABLET | Freq: Every day | ORAL | 3 refills | Status: DC

## 2017-08-08 MED ORDER — LEVOTHYROXINE SODIUM 175 MCG PO TABS: 175.0000 ug | ORAL_TABLET | Freq: Every day | ORAL | 0 refills | Status: DC

## 2017-08-08 NOTE — Assessment & Plan Note (Signed)
The patient complains of ongoing pain in her pelvis, low back, and legs. She had an MRI last weekend and is waiting to see her Orthopedist to discuss. Results indicate severe stenosis and chronic sacral fracture. Pain control regimen and fall precautions reviewed. Will let Orthopedics lead.

## 2017-08-08 NOTE — Progress Notes (Signed)
Office Visit Note   Patient: [name]           Date of Birth: 1941-03-05           MRN: [id number] Visit Date: 06/30/2017              Requested by: Briscoe Deutscher, [address], [place] [address] PCP: Briscoe Deutscher, DO   Assessment & Plan: Visit Diagnoses:  1. Chronic low back pain without sciatica, unspecified back pain laterality     Plan: We discussed the continued importance of ambulation to keep her lower extremity strength and use of a cane to help with balance with her history of falls and history of fractures.  Her peripheral neuropathy makes her at increased risk for falling.  With her again today for discussion of outline plan for continue ambulation we discussed lower extremity strengthening and fall prevention.  Follow-up PRN.  Follow-Up Instructions: No Follow-up on file.   Orders:  No orders of the defined types were placed in this encounter.  No orders of the defined types were placed in this encounter.     Procedures: No procedures performed   Clinical Data: No additional findings.   Subjective: Chief Complaint  Patient presents with  . Lower Back - Pain, Follow-up  . Right Hip - Pain, Follow-up  . Left Hip - Pain, Follow-up    HPI 77 year old female returns with ongoing problems with chronic back pain.  Had trochanteric injection some hydrocodone Medrol Dosepak and IM injection of Toradol and Depogen got improvement.  Is walking better states she has some pain and is amatory with a cane.  She continues used to have problems with aching in her feet related to her chronic neuropathy, idiopathic.  She denies associated bowel or bladder symptoms no chills or fever.  Review of Systems 14 pt  updated from 01/16/2017.  She has had ankle fracture, wrist fracture associated with falls.  Previous L4-S1 fusion in 2011 with satisfactory healing.  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  Previous superior endplate fracture  L3.  Some moderate narrowing L3-4 above solid fusion L4-S1.  History of sacral fracture diagnosed by CT scan August 2018.  Otherwise negative as it pertains HPI.   Objective: Vital Signs: BP (!) 157/79   Pulse 64   Physical Exam  Constitutional: She is oriented to person, place, and time. She appears well-developed.  HENT:  Head: Normocephalic.  Right Ear: External ear normal.  Left Ear: External ear normal.  Eyes: Pupils are equal, round, and reactive to light.  Glasses  Neck: No tracheal deviation present. No thyromegaly present.  Cardiovascular: Normal rate.  Pulmonary/Chest: Effort normal.  Abdominal: Soft.  Neurological: She is alert and oriented to person, place, and time.  Skin: Skin is warm and dry.  Psychiatric: She has a normal mood and affect. Her behavior is normal.    Ortho Exam well-healed lumbar incision.  Sensation feet and ankles shows decreased sensation consistent with peripheral neuropathy without skin lesions.  No ulcerations.  Good range of motion of her knee with good stability well-healed incision.  Anterior tib quads are strong.  Negative Homan.  Reflexes are intact.  Specialty Comments:  No specialty comments available.  Imaging: No results found.   PMFS History: Patient Active Problem List   Diagnosis Date Noted  . B12 deficiency 08/08/2017  . Tardive dyskinesia   . History of stroke   . Mitral valve prolapse   . Eustachian tube  dysfunction   . Chronic maxillary sinusitis, using NetiPot, flonase   . 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 Sacral fracture, closed ([place]) 01/07/2017  . Back pain 01/07/2017  . Falls frequently 11/21/2016  . Pigmented villonodular synovitis of knee, left 03/22/2016  . Abnormal radionuclide bone scan 08/16/2015  . HLD (hyperlipidemia), on Lovastatin 07/28/2015  . Fatty liver, on CT 09/09/14 09/09/2014  . Memory loss 01/08/2014  . 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 Pancreatic cancer  [facility]), MRI 06/19/13 07/10/2013  . Carcinoma of head of pancreas ([place]) 07/01/2013  . 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 Paresthesia of foot 06/06/2012  . 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 Colon polyps 06/05/2008  . Osteoarthritis 06/04/2008  . Chronic diarrhea, intermittent 04/22/2008  . Hypothyroidism, on Levothryoxine 02/26/2007  . Adjustment disorder with mixed anxiety and depressed mood, on Cymbalta and Buspar 02/26/2007  . Glaucoma, using Refresh tears, followed by [facility] yearly 02/26/2007  . Essential hypertension 02/26/2007  . Allergic rhinitis 02/26/2007  . GERD, on Prilosec 02/26/2007  . Hiatal hernia 02/26/2007  . Osteoporosis 02/26/2007   Past Medical History:  Diagnosis Date  . Abnormality of gait 03/27/2014  . Allergic rhinitis   . Ankle fracture, bimalleolar, closed 08/16/2015  . Cancer ([place]) 07/10/13   Pancreatic cancer with MRI scan 06-19-13  . Chronic maxillary sinusitis    neti pot  . Closed fracture of ramus of right pubis ([place]) 11/22/2016  . Depression    alone a lot  . Eustachian tube dysfunction   . GERD (gastroesophageal reflux disease)   . Glaucoma   . Heart murmur    hx. "MVP" -predental antibiotics  . Hiatal hernia   . Hypertension   . Hypothyroid   . Memory loss    short term memory loss  . Mitral valve prolapse    antibiotics before dental procedures  . Neuropathy   . Open Colles' fracture of right radius 11/02/2016  . Osteoarthritis   . Stroke [facility])    mini storkes left leg paraylsis. patient denies weakness 01/08/14.   . 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 Tardive dyskinesia    possibly reglan, vitamin E helps  . Urgency of urination    some UTI in past    Family History  Problem Relation Age of Onset  . Cancer Mother        Breast Cancer with Metastatic disease  . Alzheimer's disease Father   . Other Brother 53       GSW    Past Surgical History:  Procedure Laterality Date  . 1 baker cyst  removed    . ABDOMINAL HYSTERECTOMY     including ovaries  . BACK SURGERY     fusion  . BLEPHAROPLASTY Bilateral    with cataract surgery  . BREAST EXCISIONAL BIOPSY     left x2  . BREAST SURGERY     Biopsy left 2 times  . COLONOSCOPY W/ POLYPECTOMY     2004 last colonoscopy, no polyps  . DILATION AND CURETTAGE OF UTERUS     x3  . ESOPHAGOGASTRODUODENOSCOPY N/A 09/11/2013   Procedure: ESOPHAGOGASTRODUODENOSCOPY (EGD);  Surgeon: Cleotis Nipper, MD;  Location: [facility] ENDOSCOPY;  Service: Endoscopy;  Laterality: N/A;  Moderate sedation okay if MAC not available  . EUS N/A 07/10/2013   Procedure: ESOPHAGEAL ENDOSCOPIC ULTRASOUND (EUS) RADIAL;  Surgeon: Arta Silence, MD;  Location: [facility] ENDOSCOPY;  Service: Endoscopy;  Laterality: N/A;  . EYE SURGERY Right    cataract  .  FINE NEEDLE ASPIRATION N/A 07/10/2013   Procedure: FINE NEEDLE ASPIRATION (FNA) LINEAR;  Surgeon: Arta Silence, MD;  Location: [facility] ENDOSCOPY;  Service: Endoscopy;  Laterality: N/A;  possible fna  . HARDWARE REMOVAL Right 12/15/2016   Procedure: Hardware removal and tenolysis right wrist with repair reconstruction as necessary;  Surgeon: Roseanne Kaufman, MD;  Location: [place];  Service: Orthopedics;  Laterality: Right;  60 mins  . JOINT REPLACEMENT     LTKA  . KNEE SURGERY Left    x 5, total knee Left knee  . LAPAROSCOPY N/A 08/07/2013   Procedure: LAPAROSCOPY DIAGNOSTIC;  Surgeon: [name], MD;  Location: [place];  Service: General;  Laterality: N/A;  . LUMBAR SPINE SURGERY     x2  . LUMBAR SPINE SURGERY     cyst  . ORIF ANKLE FRACTURE Right 08/16/2015   Procedure: OPEN REDUCTION INTERNAL FIXATION (ORIF) ANKLE FRACTURE;  Surgeon: Meredith Pel, MD;  Location: [place];  Service: Orthopedics;  Laterality: Right;  . ORIF WRIST FRACTURE Right 11/01/2016   Procedure: OPEN REDUCTION INTERNAL FIXATION (ORIF) RIGHT WRIST FRACTURE WITH APPLICATION OF SPANNING PLATE, IRRIGATION AND DEBRIDEMENT RIGHT WRIST;  Surgeon: Roseanne Kaufman, MD;  Location: [facility];  Service: Orthopedics;  Laterality: Right;  . RADIOACTIVE SEED GUIDED EXCISIONAL BREAST BIOPSY Left 12/15/2015   Procedure: LEFT RADIOACTIVE SEED GUIDED EXCISIONAL BREAST BIOPSY;  Surgeon: [name], MD;  Location: [place];  Service: General;  Laterality: Left;  . WHIPPLE PROCEDURE N/A 08/07/2013   Procedure: WHIPPLE PROCEDURE;  Surgeon: [name], MD;  Location: [place];  Service: General;  Laterality: N/A;   Social History   Occupational History    Employer: RETIRED  . Occupation: Retired  Tobacco Use  . Smoking status: Never Smoker  . Smokeless tobacco: Never Used  Substance and Sexual Activity  . Alcohol use: No  . Drug use: No  . Sexual activity: Not Currently

## 2017-08-08 NOTE — Assessment & Plan Note (Signed)
Now being followed by cardiology and the pharmacy clinic.  At the most recent visit yesterday, she was encouraged to move her lisinopril or amlodipine to needs for better balance of her HTN medications.  She will be returning to them in 3-4 weeks for follow-up.

## 2017-08-08 NOTE — Assessment & Plan Note (Signed)
Doing well on current dose of Lyrica. Denies sedation. Lyrica through drug company program.

## 2017-08-08 NOTE — Assessment & Plan Note (Signed)
Lab Results  Component Value Date   TSH 4.30 08/08/2017   Continue current dose of Levothyroxine 175 mcg po daily.

## 2017-08-08 NOTE — Progress Notes (Signed)
[name] is a 76 y.o. female is here for follow up.  History of Present Illness:   HPI: See Assessment and Plan section for Problem Based Charting of issues discussed today.   There are no preventive care reminders to display for this patient.   Depression screen [facility] 2/9 08/08/2017 09/15/2016 09/13/2016  Decreased Interest 3 0 0  Down, Depressed, Hopeless 1 0 0  PHQ - 2 Score 4 0 0  Altered sleeping 3 - -  Tired, decreased energy 3 - -  Change in appetite 3 - -  Feeling bad or failure about yourself  0 - -  Trouble concentrating 1 - -  Moving slowly or fidgety/restless 1 - -  Suicidal thoughts 0 - -  PHQ-9 Score 15 - -  Difficult doing work/chores Not difficult at all - -  Some recent data might be hidden   PMHx, SurgHx, SocialHx, FamHx, Medications, and Allergies were reviewed in the Visit Navigator and updated as appropriate.   Patient Active Problem List   Diagnosis Date Noted  . Lumbar stenosis, followed by Dr. 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 Tardive dyskinesia, vitamin E   . History of stroke   . Mitral valve prolapse   . Chronic maxillary sinusitis, using NetiPot, flonase   . 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 Chronic left sacral fracture with mild edema, MRI 07/2017 01/07/2017  . Back pain 01/07/2017  . Falls frequently 11/21/2016  . Abnormal radionuclide bone scan 08/16/2015  . HLD (hyperlipidemia), on Lovastatin 07/28/2015  . Fatty liver, on CT 09/09/14 09/09/2014  . Memory loss 01/08/2014  . 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 Pancreatic cancer [facility]), MRI 06/19/13 07/10/2013  . Carcinoma of head of pancreas ([place]) 07/01/2013  . Peripheral neuropathy, Lyrica 400 mg BID 08/30/2012  . 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 Colon polyps 06/05/2008  . Osteoarthritis 06/04/2008  . Chronic diarrhea, intermittent 04/22/2008  . Hypothyroidism, on Levothryoxine 02/26/2007  . Adjustment disorder with mixed anxiety and  depressed mood, on Cymbalta and Buspar 02/26/2007  . Glaucoma, using Refresh tears, followed by [facility] yearly 02/26/2007  . Essential hypertension 02/26/2007  . Allergic rhinitis 02/26/2007  . GERD, on Prilosec 02/26/2007  . Hiatal hernia 02/26/2007  . Osteoporosis, on Calcium and Vitamin D 02/26/2007   Social History   Tobacco Use  . Smoking status: Never Smoker  . Smokeless tobacco: Never Used  Substance Use Topics  . Alcohol use: No  . Drug use: No   Current Medications and Allergies:   .  amLODipine (NORVASC) 5 MG tablet, Take 1 tablet (5 mg total) by mouth daily., Disp: 30 tablet, Rfl: 5 .  aspirin 81 MG tablet, Take 81 mg by mouth daily., Disp: , Rfl:  .  atenolol (TENORMIN) 50 MG tablet, Take 1 tablet (50 mg total) by mouth 2 (two) times daily., Disp: 180 tablet, Rfl: 2 .  busPIRone (BUSPAR) 5 MG tablet, Take 1 tablet (5 mg total) by mouth 2 (two) times daily., Disp: 60 tablet, Rfl: 5 .  Calcium Carbonate (CALCIUM 600 PO), Take 1 tablet by mouth daily., Disp: , Rfl:  .  carboxymethylcellulose (REFRESH TEARS) 0.5 % SOLN, Place 1 drop into both eyes 2 (two) times daily. , Disp: , Rfl:  .  DULoxetine (CYMBALTA) 30 MG capsule, Take 1 capsule (30 mg total) by mouth 2 (two) times daily., Disp: 60 capsule, Rfl: 3 .  fluticasone (FLONASE) 50 MCG/ACT nasal spray, Place 2 sprays into both nostrils daily.,  Disp: 16 g, Rfl: 6 .  hydrochlorothiazide (MICROZIDE) 12.5 MG capsule, Take 1 capsule (12.5 mg total) by mouth daily., Disp: 30 capsule, Rfl: 5 .  HYDROcodone-acetaminophen (NORCO/VICODIN) 5-325 MG tablet, Take 1 tablet by mouth every 12 (twelve) hours as needed for moderate pain., Disp: 30 tablet, Rfl: 0 .  latanoprost (XALATAN) 0.005 % ophthalmic solution, Place 1 drop into both eyes nightly., Disp: , Rfl:  .  lipase/protease/amylase (CREON) 12000 UNITS CPEP capsule, Take 2 capsules (24,000 Units total) by mouth 3 (three) times daily before meals., Disp: 270 capsule, Rfl: 5 .  lisinopril  (PRINIVIL,ZESTRIL) 40 MG tablet, Take 1 tablet (40 mg total) by mouth daily., Disp: 90 tablet, Rfl: 1 .  lovastatin (MEVACOR) 40 MG tablet, Take 1 tablet (40 mg total) by mouth at bedtime., Disp: 90 tablet, Rfl: 2 .  omeprazole (PRILOSEC OTC) 20 MG tablet, Take 20 mg by mouth 2 (two) times daily. , Disp: , Rfl:  .  polycarbophil (FIBERCON) 625 MG tablet, Take 625 mg by mouth 4 (four) times daily. , Disp: , Rfl:  .  pregabalin (LYRICA) 100 MG capsule, Take 1 capsule (100 mg total) by mouth 2 (two) times daily., Disp: 60 capsule, Rfl: 11 .  pregabalin (LYRICA) 300 MG capsule, Take 1 capsule (300 mg total) by mouth 2 (two) times daily., Disp: 180 capsule, Rfl: 1 .  Probiotic Product (PROBIOTIC-10 PO), Take 1 tablet by mouth 2 (two) times daily., Disp: , Rfl:  .  psyllium (REGULOID) 0.52 g capsule, Take 0.52 g by mouth daily., Disp: , Rfl:  .  rOPINIRole (REQUIP) 0.5 MG tablet, Take 1 tablet (0.5 mg total) by mouth at bedtime., Disp: 30 tablet, Rfl: 5 .  vitamin E 400 UNIT capsule, Take 800 Units by mouth 2 (two) times daily. For tartive dyskinesia, Disp: , Rfl:  .  levothyroxine (SYNTHROID, LEVOTHROID) 175 MCG tablet, Take 1 tablet (175 mcg total) by mouth daily before breakfast., Disp: 90 tablet, Rfl: 3  Allergies  Allergen Reactions  . Other Other (See Comments)    According to patient Trilafor and Reglan cause Tardive Dyskinesia  . Metoclopramide Hcl Other (See Comments)    Shaking; caused tardive dyskinesia  . Niacin Other (See Comments)    shaking  . Trovan [Alatrofloxacin Mesylate] Other (See Comments)    Caused shaking and nervousness  . Benzocaine-Resorcinol Rash  . Celecoxib Other (See Comments)    unknown  . Erythromycin Base Rash  . Glucosamine Other (See Comments)    unknown  . Nortriptyline Other (See Comments)    Dizziness   . Phenazopyridine Hcl Other (See Comments)    unknown  . Sulfa Antibiotics Nausea And Vomiting and Rash  . Sulfonamide Derivatives Nausea And Vomiting  and Rash   Review of Systems   Pertinent items are noted in the HPI. Otherwise, ROS is negative.  Vitals:   Vitals:   08/08/17 1009  BP: (!) 142/78  Pulse: 63  SpO2: 98%  Weight: 150 lb (68 kg)  Height: 5\' 1"  (1.549 m)     Body mass index is 28.34 kg/m.  Physical Exam:   Physical Exam  Constitutional: She appears well-developed and well-nourished. No distress.  HENT:  Head: Normocephalic and atraumatic.  Eyes: EOM are normal. Pupils are equal, round, and reactive to light.  Neck: Normal range of motion. Neck supple.  Cardiovascular: Normal rate, regular rhythm and intact distal pulses.  Murmur heard. Pulmonary/Chest: Effort normal and breath sounds normal.  Abdominal: Soft. Bowel sounds are normal.  Musculoskeletal: She exhibits no edema.  Able to get up easily from chair. Walks with bent-forward position. Frequent balance issues.  Neurological: She is alert.  Skin: Skin is warm.  Psychiatric: She has a normal mood and affect. Her behavior is normal.  Nursing note and vitals reviewed.   Results for orders placed or performed in visit on 08/08/17  B12  Result Value Ref Range   Vitamin B-12 667 211 - 911 pg/mL  CBC with Differential/Platelet  Result Value Ref Range   WBC 7.3 4.0 - 10.5 K/uL   RBC 3.96 3.87 - 5.11 Mil/uL   Hemoglobin 12.9 12.0 - 15.0 g/dL   HCT 37.7 36.0 - 46.0 %   MCV 95.2 78.0 - 100.0 fl   MCHC 34.3 30.0 - 36.0 g/dL   RDW 16.1 (H) 11.5 - 15.5 %   Platelets 158.0 150.0 - 400.0 K/uL   Neutrophils Relative % 67.4 43.0 - 77.0 %   Lymphocytes Relative 21.6 12.0 - 46.0 %   Monocytes Relative 8.3 3.0 - 12.0 %   Eosinophils Relative 2.2 0.0 - 5.0 %   Basophils Relative 0.5 0.0 - 3.0 %   Neutro Abs 4.9 1.4 - 7.7 K/uL   Lymphs Abs 1.6 0.7 - 4.0 K/uL   Monocytes Absolute 0.6 0.1 - 1.0 K/uL   Eosinophils Absolute 0.2 0.0 - 0.7 K/uL   Basophils Absolute 0.0 0.0 - 0.1 K/uL  Comprehensive metabolic panel  Result Value Ref Range   Sodium 136 135 - 145  mEq/L   Potassium 4.7 3.5 - 5.1 mEq/L   Chloride 99 96 - 112 mEq/L   CO2 29 19 - 32 mEq/L   Glucose, Bld 93 70 - 99 mg/dL   BUN 18 6 - 23 mg/dL   Creatinine, Ser 0.74 0.40 - 1.20 mg/dL   Total Bilirubin 0.5 0.2 - 1.2 mg/dL   Alkaline Phosphatase 128 (H) 39 - 117 U/L   AST 24 0 - 37 U/L   ALT 25 0 - 35 U/L   Total Protein 6.3 6.0 - 8.3 g/dL   Albumin 4.1 3.5 - 5.2 g/dL   Calcium 9.3 8.4 - 10.5 mg/dL   GFR 80.68 >60.00 mL/min  TSH  Result Value Ref Range   TSH 4.30 0.35 - 4.50 uIU/mL  T4, free  Result Value Ref Range   Free T4 0.65 0.60 - 1.60 ng/dL  Lipid panel  Result Value Ref Range   Cholesterol 127 0 - 200 mg/dL   Triglycerides 184.0 (H) 0.0 - 149.0 mg/dL   HDL 52.80 >39.00 mg/dL   VLDL 36.8 0.0 - 40.0 mg/dL   LDL Cholesterol 37 0 - 99 mg/dL   Total CHOL/HDL Ratio 2    NonHDL 73.72    Assessment and Plan:   Patient Active Problem List   Diagnosis Date Noted  . Lumbar stenosis, followed by Dr. 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 Tardive dyskinesia, vitamin E   . History of stroke   . Mitral valve prolapse   . Chronic maxillary sinusitis, using NetiPot, flonase   . 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 Chronic left sacral fracture with mild edema, MRI 07/2017 01/07/2017  . Back pain 01/07/2017  . Falls frequently 11/21/2016  . Abnormal radionuclide bone scan 08/16/2015  . HLD (hyperlipidemia), on Lovastatin 07/28/2015  . Fatty liver, on CT 09/09/14 09/09/2014  . Memory loss 01/08/2014  . 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 Pancreatic cancer [facility]), MRI 06/19/13 07/10/2013  . Carcinoma of head  of pancreas ([place]) 07/01/2013  . Peripheral neuropathy, Lyrica 400 mg BID 08/30/2012  . 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 Colon polyps 06/05/2008  . Osteoarthritis 06/04/2008  . Chronic diarrhea, intermittent 04/22/2008  . Hypothyroidism, on Levothryoxine 02/26/2007  . Adjustment disorder with mixed anxiety and depressed mood, on  Cymbalta and Buspar 02/26/2007  . Glaucoma, using Refresh tears, followed by [facility] yearly 02/26/2007  . Essential hypertension 02/26/2007  . Allergic rhinitis 02/26/2007  . GERD, on Prilosec 02/26/2007  . Hiatal hernia 02/26/2007  . Osteoporosis, on Calcium and Vitamin D 02/26/2007   1. Acquired hypothyroidism   2. Mitral valve prolapse   3. Osteoporosis, unspecified osteoporosis type, unspecified pathological fracture presence   4. Essential hypertension   5. B12 deficiency   6. Adjustment disorder with mixed anxiety and depressed mood   7. Mixed hyperlipidemia   8. Polyp of colon, unspecified part of colon, unspecified type   9. Chronic diarrhea, intermittent   10. Carcinoma of head of pancreas ([place])   11. Chronic maxillary sinusitis, using NetiPot, flonase   12. Seasonal allergic rhinitis due to pollen   13. Exocrine pancreatic insufficiency   14. Hereditary and idiopathic peripheral neuropathy   15. Tardive dyskinesia, vitamin E   16. Primary osteoarthritis involving multiple joints   17. Pancreatic cancer ([place]), MRI 06/19/13   18. Memory loss   19. Spinal stenosis of lumbar region without neurogenic claudication   20. Closed fracture of sacrum with delayed healing, unspecified fracture morphology, subsequent encounter    Essential hypertension Now being followed by cardiology and the pharmacy clinic.  At the most recent visit yesterday, she was encouraged to move her lisinopril or amlodipine to needs for better balance of her HTN medications.  She will be returning to them in 3-4 weeks for follow-up.  Exocrine pancreatic insufficiency Weaning Creon.  Hypothyroidism, on Levothryoxine Lab Results  Component Value Date   TSH 4.30 08/08/2017   Continue current dose of Levothyroxine 175 mcg po daily.   Peripheral neuropathy, Lyrica 400 mg BID Doing well on current dose of Lyrica. Denies sedation. Lyrica through drug company program.   Lumbar stenosis, followed by Dr. Lorin Mercy,  L3-4 disease, severe The patient complains of ongoing pain in her pelvis, low back, and legs. She had an MRI last weekend and is waiting to see her Orthopedist to discuss. Results indicate severe stenosis and chronic sacral fracture. Pain control regimen and fall precautions reviewed. Will let Orthopedics lead.  Orders Placed This Encounter  Procedures  . B12  . CBC with Differential/Platelet  . Comprehensive metabolic panel  . TSH  . T4, free  . Lipid panel   Meds ordered this encounter  Medications  . DISCONTD: levothyroxine (SYNTHROID, LEVOTHROID) 175 MCG tablet    Sig: Take 1 tablet (175 mcg total) by mouth daily before breakfast.    Dispense:  30 tablet    Refill:  0  . levothyroxine (SYNTHROID, LEVOTHROID) 175 MCG tablet    Sig: Take 1 tablet (175 mcg total) by mouth daily before breakfast.    Dispense:  90 tablet    Refill:  3   . Reviewed expectations re: course of current medical issues. . Discussed self-management of symptoms. . Outlined signs and symptoms indicating need for more acute intervention. . Patient verbalized understanding and all questions were answered. Marland Kitchen Health Maintenance issues including appropriate healthy diet, exercise, and smoking avoidance were discussed with patient. . See orders for this visit as documented in the electronic medical record. . Patient  received an After Visit Summary.  CMA served as Education administrator during this visit. History, Physical, and Plan performed by medical provider. The above documentation has been reviewed and is accurate and complete. Briscoe Deutscher, D.O.  Briscoe Deutscher, DO [place], [place] 08/08/2017  Records requested if needed. Time spent with the patient: 45 minutes, of which >50% was spent in obtaining information about her symptoms, reviewing her previous labs, evaluations, and treatments, counseling her about her condition (please see the discussed topics above), and developing a plan to further investigate it; she  had a number of questions which I addressed.

## 2017-08-08 NOTE — Assessment & Plan Note (Signed)
Weaning Creon.

## 2017-08-09 ENCOUNTER — Ambulatory Visit (INDEPENDENT_AMBULATORY_CARE_PROVIDER_SITE_OTHER): Payer: Medicare Other | Admitting: Endocrinology

## 2017-08-09 ENCOUNTER — Encounter: Payer: Self-pay | Admitting: Endocrinology

## 2017-08-09 VITALS — BP 140/88 | HR 69 | Wt 151.0 lb

## 2017-08-09 DIAGNOSIS — R61 Generalized hyperhidrosis: Secondary | ICD-10-CM

## 2017-08-09 DIAGNOSIS — I10 Essential (primary) hypertension: Secondary | ICD-10-CM

## 2017-08-09 NOTE — Progress Notes (Signed)
Patient ID: [name], female   DOB: [birth year], 77 y.o.   MRN: [id number]          Referring physicians: Jonelle Sidle [place]/Erica Juleen [place]  Chief complaint: High blood pressure  History of Present Illness:  She has had reportedly mild hypertension for several years treated with atenolol Late last year her blood sugars started increasing especially when she was in an assisted living facility Blood pressure may have been as high as 205/100 at one-point For these high blood pressure episodes she was seen in the emergency room twice but no records are available Patient is a poor historian and not clear if she was having unusual headaches, palpitations or flushing at those episodes  Subsequently the patient has been given additional medications including HCTZ, lisinopril 40 mg and amlodipine 5 mg She does check her blood pressure at home and she thinks her usual ranges about 140-160/70-79 She is using an [employer] monitor at home Currently her blood pressure medications are being managed by her cardiologist  Currently the patient does not have any episodic headaches, she says she has a bitemporal headache periodically and has been told that she has migraines She does not have any flushing episodes that have been reported by her or her family She does not complain of any significant palpitations She does however have episodes of sweating which are mostly in the morning for up to 1 hour not associated with any other symptoms She says she has been having these episodes for about 6 months and may feel hot at times also, similar to her hot flashes  Blood pressure readings from recent office visits are as follows:  BP Readings from Last 3 Encounters:  08/09/17 140/88  08/08/17 (!) 142/78  08/07/17 130/68    The patient had plasma catecholamines and metanephrines done on 06/13/2017 and has been referred here because of high levels These tests were done randomly at about noon probably in a sitting  position Her atenolol was not stopped for these tests  Her normetanephrine level was high at 399 and and the indeterminate range   Past Medical History:  Diagnosis Date  . Abnormality of gait 03/27/2014  . Allergic rhinitis   . Ankle fracture, bimalleolar, closed 08/16/2015  . B12 deficiency 08/08/2017  . Cancer ([place]) 07/10/13   Pancreatic cancer with MRI scan 06-19-13  . Chronic maxillary sinusitis    neti pot  . Closed fracture of ramus of right pubis ([place]) 11/22/2016  . Depression    alone a lot  . Eustachian tube dysfunction   . GERD (gastroesophageal reflux disease)   . Glaucoma   . Heart murmur    hx. "MVP" -predental antibiotics  . Hiatal hernia   . Hypertension   . Hypothyroid   . Memory loss    short term memory loss  . Mitral valve prolapse    antibiotics before dental procedures  . Neuropathy   . Open Colles' fracture of right radius 11/02/2016  . Osteoarthritis   . Sacral fracture, closed ([place]) 01/07/2017  . Stroke [facility])    mini storkes left leg paraylsis. patient denies weakness 01/08/14.   . 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 Tardive dyskinesia    possibly reglan, vitamin E helps  . Urgency of urination    some UTI in past    Past Surgical History:  Procedure Laterality Date  . 1 baker cyst removed    . ABDOMINAL HYSTERECTOMY     including ovaries  . BACK SURGERY  fusion  . BLEPHAROPLASTY Bilateral    with cataract surgery  . BREAST EXCISIONAL BIOPSY     left x2  . BREAST SURGERY     Biopsy left 2 times  . COLONOSCOPY W/ POLYPECTOMY     2004 last colonoscopy, no polyps  . DILATION AND CURETTAGE OF UTERUS     x3  . ESOPHAGOGASTRODUODENOSCOPY N/A 09/11/2013   Procedure: ESOPHAGOGASTRODUODENOSCOPY (EGD);  Surgeon: Cleotis Nipper, MD;  Location: [facility] ENDOSCOPY;  Service: Endoscopy;  Laterality: N/A;  Moderate sedation okay if MAC not available  . EUS N/A 07/10/2013   Procedure: ESOPHAGEAL ENDOSCOPIC ULTRASOUND (EUS) RADIAL;   Surgeon: Arta Silence, MD;  Location: [facility] ENDOSCOPY;  Service: Endoscopy;  Laterality: N/A;  . EYE SURGERY Right    cataract  . FINE NEEDLE ASPIRATION N/A 07/10/2013   Procedure: FINE NEEDLE ASPIRATION (FNA) LINEAR;  Surgeon: Arta Silence, MD;  Location: [facility] ENDOSCOPY;  Service: Endoscopy;  Laterality: N/A;  possible fna  . HARDWARE REMOVAL Right 12/15/2016   Procedure: Hardware removal and tenolysis right wrist with repair reconstruction as necessary;  Surgeon: Roseanne Kaufman, MD;  Location: [place];  Service: Orthopedics;  Laterality: Right;  60 mins  . JOINT REPLACEMENT     LTKA  . KNEE SURGERY Left    x 5, total knee Left knee  . LAPAROSCOPY N/A 08/07/2013   Procedure: LAPAROSCOPY DIAGNOSTIC;  Surgeon: [name], MD;  Location: [place];  Service: General;  Laterality: N/A;  . LUMBAR SPINE SURGERY     x2  . LUMBAR SPINE SURGERY     cyst  . ORIF ANKLE FRACTURE Right 08/16/2015   Procedure: OPEN REDUCTION INTERNAL FIXATION (ORIF) ANKLE FRACTURE;  Surgeon: Meredith Pel, MD;  Location: [place];  Service: Orthopedics;  Laterality: Right;  . ORIF WRIST FRACTURE Right 11/01/2016   Procedure: OPEN REDUCTION INTERNAL FIXATION (ORIF) RIGHT WRIST FRACTURE WITH APPLICATION OF SPANNING PLATE, IRRIGATION AND DEBRIDEMENT RIGHT WRIST;  Surgeon: Roseanne Kaufman, MD;  Location: [facility] ORS;  Service: Orthopedics;  Laterality: Right;  . RADIOACTIVE SEED GUIDED EXCISIONAL BREAST BIOPSY Left 12/15/2015   Procedure: LEFT RADIOACTIVE SEED GUIDED EXCISIONAL BREAST BIOPSY;  Surgeon: [name], MD;  Location: [place];  Service: General;  Laterality: Left;  . WHIPPLE PROCEDURE N/A 08/07/2013   Procedure: WHIPPLE PROCEDURE;  Surgeon: [name], MD;  Location: [facility] OR;  Service: General;  Laterality: N/A;    Family History  Problem Relation Age of Onset  . Cancer Mother        Breast Cancer with Metastatic disease  . Alzheimer's disease Father   . Other Brother 67       GSW  . Hypertension Neg Hx     Social  History:  reports that  has never smoked. she has never used smokeless tobacco. She reports that she does not drink alcohol or use drugs.  Allergies:  Allergies  Allergen Reactions  . Other Other (See Comments)    According to patient Trilafor and Reglan cause Tardive Dyskinesia  . Metoclopramide Hcl Other (See Comments)    Shaking; caused tardive dyskinesia  . Niacin Other (See Comments)    shaking  . Trovan [Alatrofloxacin Mesylate] Other (See Comments)    Caused shaking and nervousness  . Benzocaine-Resorcinol Rash  . Celecoxib Other (See Comments)    unknown  . Erythromycin Base Rash  . Glucosamine Other (See Comments)    unknown  . Nortriptyline Other (See Comments)    Dizziness   . Phenazopyridine Hcl Other (See Comments)  unknown  . Sulfa Antibiotics Nausea And Vomiting and Rash  . Sulfonamide Derivatives Nausea And Vomiting and Rash    Allergies as of 08/09/2017      Reactions   Other Other (See Comments)   According to patient Trilafor and Reglan cause Tardive Dyskinesia   Metoclopramide Hcl Other (See Comments)   Shaking; caused tardive dyskinesia   Niacin Other (See Comments)   shaking   Trovan [alatrofloxacin Mesylate] Other (See Comments)   Caused shaking and nervousness   Benzocaine-resorcinol Rash   Celecoxib Other (See Comments)   unknown   Erythromycin Base Rash   Glucosamine Other (See Comments)   unknown   Nortriptyline Other (See Comments)   Dizziness    Phenazopyridine Hcl Other (See Comments)   unknown   Sulfa Antibiotics Nausea And Vomiting, Rash   Sulfonamide Derivatives Nausea And Vomiting, Rash      Medication List        Accurate as of 08/09/17  9:10 PM. Always use your most recent med list.          acetaminophen 325 MG tablet Commonly known as:  TYLENOL Take 650 mg by mouth every 6 (six) hours as needed.   amLODipine 5 MG tablet Commonly known as:  NORVASC Take 1 tablet (5 mg total) by mouth daily.   aspirin 81 MG  tablet Take 81 mg by mouth daily.   atenolol 50 MG tablet Commonly known as:  TENORMIN Take 1 tablet (50 mg total) by mouth 2 (two) times daily.   busPIRone 5 MG tablet Commonly known as:  BUSPAR Take 1 tablet (5 mg total) by mouth 2 (two) times daily.   CALCIUM 600 PO Take 1 tablet by mouth daily.   DULoxetine 30 MG capsule Commonly known as:  CYMBALTA Take 1 capsule (30 mg total) by mouth 2 (two) times daily.   fluticasone 50 MCG/ACT nasal spray Commonly known as:  FLONASE Place 2 sprays into both nostrils daily.   hydrochlorothiazide 12.5 MG capsule Commonly known as:  MICROZIDE Take 1 capsule (12.5 mg total) by mouth daily.   HYDROcodone-acetaminophen 5-325 MG tablet Commonly known as:  NORCO/VICODIN Take 1 tablet by mouth every 12 (twelve) hours as needed for moderate pain.   latanoprost 0.005 % ophthalmic solution Commonly known as:  XALATAN Place 1 drop into both eyes nightly.   levothyroxine 175 MCG tablet Commonly known as:  SYNTHROID, LEVOTHROID Take 1 tablet (175 mcg total) by mouth daily before breakfast.   lipase/protease/amylase 12000 units Cpep capsule Commonly known as:  CREON Take 2 capsules (24,000 Units total) by mouth 3 (three) times daily before meals.   lisinopril 40 MG tablet Commonly known as:  PRINIVIL,ZESTRIL Take 1 tablet (40 mg total) by mouth daily.   lovastatin 40 MG tablet Commonly known as:  MEVACOR Take 1 tablet (40 mg total) by mouth at bedtime.   omeprazole 20 MG tablet Commonly known as:  PRILOSEC OTC Take 20 mg by mouth 2 (two) times daily.   ondansetron 4 MG tablet Commonly known as:  ZOFRAN Take 4 mg by mouth every 8 (eight) hours as needed for nausea or vomiting.   polycarbophil 625 MG tablet Commonly known as:  FIBERCON Take 625 mg by mouth 4 (four) times daily.   pregabalin 100 MG capsule Commonly known as:  LYRICA Take 1 capsule (100 mg total) by mouth 2 (two) times daily.   pregabalin 300 MG  capsule Commonly known as:  LYRICA Take 1 capsule (300 mg total) by  mouth 2 (two) times daily.   PROBIOTIC-10 PO Take 1 tablet by mouth 2 (two) times daily.   psyllium 0.52 g capsule Commonly known as:  REGULOID Take 0.52 g by mouth daily.   REFRESH TEARS 0.5 % Soln Generic drug:  carboxymethylcellulose Place 1 drop into both eyes 2 (two) times daily.   rOPINIRole 0.5 MG tablet Commonly known as:  REQUIP Take 1 tablet (0.5 mg total) by mouth at bedtime.   vitamin E 400 UNIT capsule Take 800 Units by mouth 2 (two) times daily. For tartive dyskinesia       LABS:  Office Visit on 08/08/2017  Component Date Value Ref Range Status  . Vitamin B-12 08/08/2017 667  211 - 911 pg/mL Final  . WBC 08/08/2017 7.3  4.0 - 10.5 K/uL Final  . RBC 08/08/2017 3.96  3.87 - 5.11 Mil/uL Final  . Hemoglobin 08/08/2017 12.9  12.0 - 15.0 g/dL Final  . HCT 08/08/2017 37.7  36.0 - 46.0 % Final  . MCV 08/08/2017 95.2  78.0 - 100.0 fl Final  . MCHC 08/08/2017 34.3  30.0 - 36.0 g/dL Final  . RDW 08/08/2017 16.1* 11.5 - 15.5 % Final  . Platelets 08/08/2017 158.0  150.0 - 400.0 K/uL Final  . Neutrophils Relative % 08/08/2017 67.4  43.0 - 77.0 % Final  . Lymphocytes Relative 08/08/2017 21.6  12.0 - 46.0 % Final  . Monocytes Relative 08/08/2017 8.3  3.0 - 12.0 % Final  . Eosinophils Relative 08/08/2017 2.2  0.0 - 5.0 % Final  . Basophils Relative 08/08/2017 0.5  0.0 - 3.0 % Final  . Neutro Abs 08/08/2017 4.9  1.4 - 7.7 K/uL Final  . Lymphs Abs 08/08/2017 1.6  0.7 - 4.0 K/uL Final  . Monocytes Absolute 08/08/2017 0.6  0.1 - 1.0 K/uL Final  . Eosinophils Absolute 08/08/2017 0.2  0.0 - 0.7 K/uL Final  . Basophils Absolute 08/08/2017 0.0  0.0 - 0.1 K/uL Final  . Sodium 08/08/2017 136  135 - 145 mEq/L Final  . Potassium 08/08/2017 4.7  3.5 - 5.1 mEq/L Final  . Chloride 08/08/2017 99  96 - 112 mEq/L Final  . CO2 08/08/2017 29  19 - 32 mEq/L Final  . Glucose, Bld 08/08/2017 93  70 - 99 mg/dL Final  . BUN  08/08/2017 18  6 - 23 mg/dL Final  . Creatinine, Ser 08/08/2017 0.74  0.40 - 1.20 mg/dL Final  . Total Bilirubin 08/08/2017 0.5  0.2 - 1.2 mg/dL Final  . Alkaline Phosphatase 08/08/2017 128* 39 - 117 U/L Final  . AST 08/08/2017 24  0 - 37 U/L Final  . ALT 08/08/2017 25  0 - 35 U/L Final  . Total Protein 08/08/2017 6.3  6.0 - 8.3 g/dL Final  . Albumin 08/08/2017 4.1  3.5 - 5.2 g/dL Final  . Calcium 08/08/2017 9.3  8.4 - 10.5 mg/dL Final  . GFR 08/08/2017 80.68  >60.00 mL/min Final  . TSH 08/08/2017 4.30  0.35 - 4.50 uIU/mL Final  . Free T4 08/08/2017 0.65  0.60 - 1.60 ng/dL Final   Comment: Specimens from patients who are undergoing biotin therapy and /or ingesting biotin supplements may contain high levels of biotin.  The higher biotin concentration in these specimens interferes with this Free T4 assay.  Specimens that contain high levels  of biotin may cause false high results for this Free T4 assay.  Please interpret results in light of the total clinical presentation of the patient.    . Cholesterol  08/08/2017 127  0 - 200 mg/dL Final   ATP III Classification       Desirable:  < 200 mg/dL               Borderline High:  200 - 239 mg/dL          High:  > = 240 mg/dL  . Triglycerides 08/08/2017 184.0* 0.0 - 149.0 mg/dL Final   Normal:  <150 mg/dLBorderline High:  150 - 199 mg/dL  . HDL 08/08/2017 52.80  >39.00 mg/dL Final  . VLDL 08/08/2017 36.8  0.0 - 40.0 mg/dL Final  . LDL Cholesterol 08/08/2017 37  0 - 99 mg/dL Final  . Total CHOL/HDL Ratio 08/08/2017 2   Final                  Men          Women1/2 Average Risk     3.4          3.3Average Risk          5.0          4.42X Average Risk          9.6          7.13X Average Risk          15.0          11.0                      . NonHDL 08/08/2017 73.72   Final   NOTE:  Non-HDL goal should be 30 mg/dL higher than patient's LDL goal (i.e. LDL goal of < 70 mg/dL, would have non-HDL goal of < 100 mg/dL)        Review of Systems   Constitutional: Positive for diaphoresis.       Sweating episodes occur in the morning daily  HENT: Positive for headaches.        1 yr,  q week rx palps  Eyes: Negative for visual disturbance.  Respiratory: Negative for shortness of breath.   Cardiovascular: Negative for palpitations and leg swelling.  Gastrointestinal: Positive for abdominal pain.       She has had persistent abdominal pain of unclear etiology  Endocrine: Negative for fatigue.  Genitourinary: Negative for dysuria.  Musculoskeletal: Positive for back pain.  Skin: Negative for rash and abnormal pigmentation.  Neurological: Negative for weakness and tremors.  Psychiatric/Behavioral: Negative for nervousness.   She has had hypothyroidism managed by her PCP  Lab Results  Component Value Date   TSH 4.30 08/08/2017       PHYSICAL EXAM:  BP 140/88 (BP Location: Left Arm, Patient Position: Sitting, Cuff Size: Normal)   Pulse 69   Wt 151 lb (68.5 kg)   SpO2 95%   BMI 28.53 kg/m   GENERAL: Averagely built and nourished with some abdominal obesity No pallor, clubbing, lymphadenopathy or edema.    Skin:  no rash or pigmentary skin lesions.  EYES:  Externally normal.  Fundii:  normal discs and vessels, better seen on the left.  ENT: Oral mucosa and tongue normal.  THYROID:  Not palpable.  HEART:  Normal  S1 and S2; no murmur or click.  CHEST:  Normal shape.  Lungs: Vescicular breath sounds heard equally.  No crepitations/ wheeze.  ABDOMEN: Relatively obese.  Liver and spleen not palpable.  No other mass or tenderness.  NEUROLOGICAL: .Reflexes are bilaterally normal at biceps   JOINTS:  Normal peripheral joints.  ASSESSMENT:   HYPERTENSION: She has had worsening of her hypertension in the last several months but currently appears well-controlled with a 4 drug regimen She is not having any typical symptoms of hypertensive crises recently or any episodes of flushing, palpitations and headaches, does  have some nonspecific symptoms otherwise Her plasma metanephrines and catecholamines were done with her being on 100 mg of atenolol which may increase the levels Also of these were not drawn fasting in the morning in a supine position and may be falsely high  Also she had normal adrenal glands on her recent radiological study done for other reasons  SWEATING episodes: These are likely to be unrelated and menopausal in origin   HYPOTHYROIDISM: Well-controlled  Other chronic active medical problems include persistent headaches, abdominal pain, low back pain due to spinal stenosis and disc degeneration   PLAN:    Repeat plasma metanephrines with her coming fasting and lying down for 10 minutes  If the levels are significantly high may consider MIBG scan  Recommend that she be tried on clonidine patch for her hypertension with adjustment of the other medications that she can have better control of her sweating episodes and hopefully with more consistent blood pressure control  Will defer her blood pressure management to her cardiologist   Consultation note sent to the referring physician  Elayne Snare 08/09/2017, 9:10 PM

## 2017-08-10 ENCOUNTER — Encounter: Payer: Self-pay | Admitting: Pharmacist

## 2017-08-10 NOTE — Assessment & Plan Note (Addendum)
Blood pressure well controlled during office visits but few systolic pressures >244 mm/Hg noted in the evening hours . Patient never changed her amlodipine to evening administration as previously recomended. She denies problems with current therapy and main complain is related to neuropathy management.   Will change amlodipine to evening administration. Proper technique to use her home meter given and discussed with patient and son. Plan to follow up in 4 weeks and re-assess need for additional medication.

## 2017-08-11 ENCOUNTER — Encounter (INDEPENDENT_AMBULATORY_CARE_PROVIDER_SITE_OTHER): Payer: Self-pay | Admitting: Orthopaedic Surgery

## 2017-08-11 ENCOUNTER — Ambulatory Visit (INDEPENDENT_AMBULATORY_CARE_PROVIDER_SITE_OTHER): Payer: Medicare Other | Admitting: Orthopaedic Surgery

## 2017-08-11 VITALS — BP 141/69 | HR 67 | Temp 97.7°F

## 2017-08-11 DIAGNOSIS — M48062 Spinal stenosis, lumbar region with neurogenic claudication: Secondary | ICD-10-CM

## 2017-08-11 NOTE — Progress Notes (Signed)
Office Visit Note   Patient: [name]           Date of Birth: 02-10-1941           MRN: [id number] Visit Date: 08/11/2017              Requested by: Briscoe Deutscher, [address] [place] [place], [place] [address] PCP: Briscoe Deutscher, DO   Assessment & Plan: Visit Diagnoses:  1. Spinal stenosis of lumbar region with neurogenic claudication     Plan: Patient has a solid fusion L4-S1 from 2011.  She is had multiple falls multiple fractures has soft bone and despite surgery still has some right distal radius deformity related to her soft bone and comminuted fracture.  She has stenosis at L3-4 above a solid fusion.  We discussed surgical options for this including decompression and instrumented fusion of the adjacent level.  We discussed problems with osteopenia potential for screw pullout, nonunion, reoperation progression of the adjacent level.  Risks of paralysis spinal fluid leakage dural tear.  We will set her up for an epidural injection I will see her back after the injection.  She will try to walk using her walker until she starts having pain and sit and rest until she can repeat.  She had asked about pain medication and I discussed and recommend she avoid narcotic medication with her multiple fall history.  We will recheck her in 2 months.  Follow-Up Instructions: Return in about 2 months (around 10/11/2017).   Orders:  Orders Placed This Encounter  Procedures  . Ambulatory referral to Physical Medicine Rehab   No orders of the defined types were placed in this encounter.     Procedures: No procedures performed   Clinical Data: No additional findings.   Subjective: Chief Complaint  Patient presents with  . Lower Back - Pain, Follow-up    MRI review    HPI 77 year old female returns post MRI lumbar spine with ongoing problems with back pain.  She states she has pain when she is up and walking she gets relief when she sits.  She took hydrocodone 3 days ago.  She is  taken some Aleve did not seem to help as much.  Previous L4-S1 fusion for severe stenosis anterolisthesis and instability on 07/13/2009.  MRI scan is available for review today.  Her daughter-in-law is with her.  Review of Systems he is systems positive for multiple fractures including superior endplate L3 fracture, M42 compression fracture, sacral fracture.  Right distal radius fracture with multiple surgeries with malunion.  Osteoporosis hypothyroidism, glaucoma, adjustment disorder mixed anxiety and depression, idiopathic peripheral neuropathy, history of pancreatic cancer 2015, CVA 2015, tardive dyskinesia, urinary urgency.  14 point review of systems otherwise negative as it pertains HPI.   Objective: Vital Signs: BP (!) 141/69   Pulse 67   Temp 97.7 F (36.5 C)   Physical Exam  Constitutional: She is oriented to person, place, and time. She appears well-developed.  HENT:  Head: Normocephalic.  Right Ear: External ear normal.  Left Ear: External ear normal.  Eyes: Pupils are equal, round, and reactive to light.  Glasses  Neck: No tracheal deviation present. No thyromegaly present.  Cardiovascular: Normal rate.  Pulmonary/Chest: Effort normal.  Abdominal: Soft.  Neurological: She is alert and oriented to person, place, and time.  Skin: Skin is warm and dry.  Psychiatric: She has a normal mood and affect. Her behavior is normal.    Ortho Exam right wrist deformity with  ulnar prominence and some radial deviation.  She can flex and extend her fingers.  Healed dorsal incision.  Bar incisions well-healed.  She ambulates with a walker with forward flexed position.  Pulses are intact.  She had decreased sensation both feet consistent with peripheral neuropathy unchanged from previous exam.  Specialty Comments:  No specialty comments available.  Imaging: CLINICAL DATA:  77 y/o F; lower back pain radiating into the right buttocks. Pain and weakness of both legs. Symptoms have become  acute 3 weeks ago. History of pancreatic cancer status post Whipple procedure.  EXAM: MRI LUMBAR SPINE WITHOUT CONTRAST  TECHNIQUE: Multiplanar, multisequence MR imaging of the lumbar spine was performed. No intravenous contrast was administered.  COMPARISON:  06/22/2017 CT abdomen and pelvis. 06/20/2017 lumbar spine radiographs. 04/22/2014 lumbar spine MRI.  FINDINGS: Segmentation:  Standard.  Alignment:  Physiologic.  Vertebrae: Mild edema within the L3 inferior endplate, likely degenerative. No additional bone marrow edema or disc edema identified within the lumbar spine. Chronic T10 compression deformity and L3 superior endplate Schmorl's node. L4-S1 posterior instrumented fusion and interbody fusion prostheses produces susceptibility artifact partially obscuring vertebral bodies through those levels. L4-S1 laminectomy postsurgical changes. Left sacral ala edema corresponding to chronic fracture deformity as seen on prior CT.  Conus medullaris and cauda equina: Conus extends to the L1-2 level. Conus and cauda equina appear normal.  Paraspinal and other soft tissues: L4-S1 posterior interbody fusion and laminectomy postsurgical changes. No fluid collection within the laminectomy site or epidural space identified.  Disc levels:  L1-2: Tiny central annular fissure and minimal disc bulge. No significant foraminal or canal stenosis.  L2-3: Stable small disc bulge and mild facet hypertrophy. Mild foraminal stenosis. No canal stenosis.  L3-4: Progression of disc bulge, right foraminal disc protrusion, facet, and ligamentum flavum hypertrophy. Severe right and moderate left foraminal stenosis. Severe canal stenosis.  L4-5: Laminectomy and interbody fusion. Patent spinal canal and neural foramina.  L5-S1: Laminectomy and interbody fusion. Patent spinal canal and neural foramina.  IMPRESSION: 1. Progression of L3-4 adjacent segment disease with  progressive disc and facet degenerative changes resulting in severe right moderate left foraminal stenosis and severe canal stenosis. 2. Chronic left sacral fracture with mild edema. 3. No acute osseous abnormality of the lumbar spine.   Electronically Signed   By: Kristine Garbe M.D.   On: 08/04/2017 20:36    PMFS History: Patient Active Problem List   Diagnosis Date Noted  . Lumbar stenosis, followed by Dr. 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 Tardive dyskinesia, vitamin E   . History of stroke   . Mitral valve prolapse   . Chronic maxillary sinusitis, using NetiPot, flonase   . 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 Chronic left sacral fracture with mild edema, MRI 07/2017 01/07/2017  . Falls frequently 11/21/2016  . Abnormal radionuclide bone scan 08/16/2015  . HLD (hyperlipidemia), on Lovastatin 07/28/2015  . Fatty liver, on CT 09/09/14 09/09/2014  . Memory loss 01/08/2014  . 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 Pancreatic cancer [facility]), MRI 06/19/13 07/10/2013  . Carcinoma of head of pancreas ([place]) 07/01/2013  . Peripheral neuropathy, Lyrica 400 mg BID 08/30/2012  . 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 Colon polyps 06/05/2008  . Osteoarthritis 06/04/2008  . Chronic diarrhea, intermittent 04/22/2008  . Hypothyroidism, on Levothryoxine 02/26/2007  . Adjustment disorder with mixed anxiety and depressed mood, on Cymbalta and Buspar 02/26/2007  . Glaucoma, using Refresh tears, followed by [facility] yearly 02/26/2007  . Essential hypertension 02/26/2007  .  Allergic rhinitis 02/26/2007  . GERD, on Prilosec 02/26/2007  . Hiatal hernia 02/26/2007  . Osteoporosis, on Calcium and Vitamin D 02/26/2007   Past Medical History:  Diagnosis Date  . Abnormality of gait 03/27/2014  . Allergic rhinitis   . Ankle fracture, bimalleolar, closed 08/16/2015  . B12 deficiency 08/08/2017  . Cancer ([place]) 07/10/13   Pancreatic cancer with MRI scan 06-19-13    . Chronic maxillary sinusitis    neti pot  . Closed fracture of ramus of right pubis ([place]) 11/22/2016  . Depression    alone a lot  . Eustachian tube dysfunction   . GERD (gastroesophageal reflux disease)   . Glaucoma   . Heart murmur    hx. "MVP" -predental antibiotics  . Hiatal hernia   . Hypertension   . Hypothyroid   . Memory loss    short term memory loss  . Mitral valve prolapse    antibiotics before dental procedures  . Neuropathy   . Open Colles' fracture of right radius 11/02/2016  . Osteoarthritis   . Sacral fracture, closed ([place]) 01/07/2017  . Stroke [facility])    mini storkes left leg paraylsis. patient denies weakness 01/08/14.   . 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 Tardive dyskinesia    possibly reglan, vitamin E helps  . Urgency of urination    some UTI in past    Family History  Problem Relation Age of Onset  . Cancer Mother        Breast Cancer with Metastatic disease  . Alzheimer's disease Father   . Other Brother 51       GSW  . Hypertension Neg Hx     Past Surgical History:  Procedure Laterality Date  . 1 baker cyst removed    . ABDOMINAL HYSTERECTOMY     including ovaries  . BACK SURGERY     fusion  . BLEPHAROPLASTY Bilateral    with cataract surgery  . BREAST EXCISIONAL BIOPSY     left x2  . BREAST SURGERY     Biopsy left 2 times  . COLONOSCOPY W/ POLYPECTOMY     2004 last colonoscopy, no polyps  . DILATION AND CURETTAGE OF UTERUS     x3  . ESOPHAGOGASTRODUODENOSCOPY N/A 09/11/2013   Procedure: ESOPHAGOGASTRODUODENOSCOPY (EGD);  Surgeon: Cleotis Nipper, MD;  Location: [facility] ENDOSCOPY;  Service: Endoscopy;  Laterality: N/A;  Moderate sedation okay if MAC not available  . EUS N/A 07/10/2013   Procedure: ESOPHAGEAL ENDOSCOPIC ULTRASOUND (EUS) RADIAL;  Surgeon: Arta Silence, MD;  Location: [facility] ENDOSCOPY;  Service: Endoscopy;  Laterality: N/A;  . EYE SURGERY Right    cataract  . FINE NEEDLE ASPIRATION N/A 07/10/2013   Procedure:  FINE NEEDLE ASPIRATION (FNA) LINEAR;  Surgeon: Arta Silence, MD;  Location: [facility] ENDOSCOPY;  Service: Endoscopy;  Laterality: N/A;  possible fna  . HARDWARE REMOVAL Right 12/15/2016   Procedure: Hardware removal and tenolysis right wrist with repair reconstruction as necessary;  Surgeon: Roseanne Kaufman, MD;  Location: [place];  Service: Orthopedics;  Laterality: Right;  60 mins  . JOINT REPLACEMENT     LTKA  . KNEE SURGERY Left    x 5, total knee Left knee  . LAPAROSCOPY N/A 08/07/2013   Procedure: LAPAROSCOPY DIAGNOSTIC;  Surgeon: [name], MD;  Location: [place];  Service: General;  Laterality: N/A;  . LUMBAR SPINE SURGERY     x2  . LUMBAR SPINE SURGERY     cyst  . ORIF ANKLE FRACTURE Right  08/16/2015   Procedure: OPEN REDUCTION INTERNAL FIXATION (ORIF) ANKLE FRACTURE;  Surgeon: Meredith Pel, MD;  Location: [place];  Service: Orthopedics;  Laterality: Right;  . ORIF WRIST FRACTURE Right 11/01/2016   Procedure: OPEN REDUCTION INTERNAL FIXATION (ORIF) RIGHT WRIST FRACTURE WITH APPLICATION OF SPANNING PLATE, IRRIGATION AND DEBRIDEMENT RIGHT WRIST;  Surgeon: Roseanne Kaufman, MD;  Location: [facility];  Service: Orthopedics;  Laterality: Right;  . RADIOACTIVE SEED GUIDED EXCISIONAL BREAST BIOPSY Left 12/15/2015   Procedure: LEFT RADIOACTIVE SEED GUIDED EXCISIONAL BREAST BIOPSY;  Surgeon: [name], MD;  Location: [place];  Service: General;  Laterality: Left;  . WHIPPLE PROCEDURE N/A 08/07/2013   Procedure: WHIPPLE PROCEDURE;  Surgeon: [name], MD;  Location: [place];  Service: General;  Laterality: N/A;   Social History   Occupational History    Employer: RETIRED  . Occupation: Retired  Tobacco Use  . Smoking status: Never Smoker  . Smokeless tobacco: Never Used  Substance and Sexual Activity  . Alcohol use: No  . Drug use: No  . Sexual activity: Not Currently

## 2017-08-15 ENCOUNTER — Encounter (INDEPENDENT_AMBULATORY_CARE_PROVIDER_SITE_OTHER): Payer: Self-pay | Admitting: Orthopaedic Surgery

## 2017-08-15 ENCOUNTER — Other Ambulatory Visit: Payer: Medicare Other

## 2017-08-15 DIAGNOSIS — I10 Essential (primary) hypertension: Secondary | ICD-10-CM

## 2017-08-16 NOTE — Telephone Encounter (Signed)
Pt's son said RX for Lyrica 300mg  was sent to [facility] and should have been sent thru PAP. Please call to advise at [phone]

## 2017-08-17 ENCOUNTER — Ambulatory Visit: Payer: Medicare Other | Admitting: Vascular Surgery

## 2017-08-18 LAB — METANEPHRINES, PLASMA
METANEPHRINE FREE: 22 pg/mL (ref 0–62)
Normetanephrine, Free: 324 pg/mL — ABNORMAL HIGH (ref 0–145)

## 2017-08-21 NOTE — Progress Notes (Signed)
Please call to let patient know that the lab results show slightly lower levels and can be explained by her being on atenolol.  Recommend taking clonidine and adjusting her blood pressure medicines to help with sweating episodes, will defer to cardiologist unless she wants to have me do this

## 2017-08-22 NOTE — Telephone Encounter (Signed)
Spoke to PAP assistance REP [place] and patient [name] was shipped 08/18/2017.

## 2017-08-22 NOTE — Telephone Encounter (Addendum)
Pt's son called the pt has not rec'd 300mg  yet. I advised him it was shipped on 3/22. He said he spoke with [facility] today and was advised they do not have rx for 300mg . He is asking Hinton Dyer to call [phone] [facility] program for clarification, open 8am-6pm He also said he spoke with [place] today and the maxium dose is 600mg /day, the pt is taking 800mg . He needs clarification. Son said he is going to stop the 100mg  and give her 300mg  am and 300mg  evening. Rn please call, he will be leaving out of town on Thursday if rn can touch base with him before then, he can be reached at [phone]

## 2017-08-22 NOTE — Telephone Encounter (Signed)
[name] Medication has be shipped . Delivery takes 7-10 day turn around time .  [name] Not sure what he is relaying to about the dose change .

## 2017-08-23 MED ORDER — PREGABALIN 300 MG PO CAPS: 300.0000 mg | ORAL_CAPSULE | Freq: Two times a day (BID) | ORAL | 1 refills | Status: DC

## 2017-08-23 NOTE — Telephone Encounter (Addendum)
[name] is calling back I relayed I relayed to [name] a message has been sent to Dr. Jannifer Franklin. We are waiting on advise Dr. Jannifer Franklin is seeing Patients he will respond in between patient's  and our computers were down this am. [name] understood.  Roger [phone]

## 2017-08-23 NOTE — Telephone Encounter (Signed)
Dr. Jannifer Franklin- please advise about dosage questions that son has, thank you

## 2017-08-23 NOTE — Telephone Encounter (Signed)
I called the son.  The patient had tolerated the 600 mg dosing quite well, there is a very safe drug and a trial on doses higher than 600 mg can be done in this case.  If she has side effects or the higher dose is not clinically effective, then I would cut back to 600 mg dosing.  I have printed out a prescription, Rance Muir is working on patient assistance with the medication.

## 2017-08-23 NOTE — Addendum Note (Signed)
Addended by: Kathrynn Ducking on: 08/23/2017 03:47 PM   Modules accepted: Orders

## 2017-08-24 ENCOUNTER — Telehealth: Payer: Self-pay | Admitting: Endocrinology

## 2017-08-24 DIAGNOSIS — N3941 Urge incontinence: Secondary | ICD-10-CM | POA: Diagnosis not present

## 2017-08-24 NOTE — Telephone Encounter (Signed)
To start Clonidine patch 0.1mg  weekly and reduce Atenolol to 1 tab instead of 2, setup f/u visit in 3 weeks. Keep check on BP at home

## 2017-08-24 NOTE — Telephone Encounter (Addendum)
Sent New Rx to [facility] 300mg   Take 1 capsule by mouth two times daily . Fax [phone]. Also spoke to pharmacy and did verbal as well  I spoke to [place]  And she relayed it will be 2-3 days for RX to be up dated in there system and 7-10 delivery turn around time. Telephone [phone]

## 2017-08-25 MED ORDER — CLONIDINE 0.1 MG/24HR TD PTWK: 0.1000 mg | MEDICATED_PATCH | TRANSDERMAL | 0 refills | Status: DC

## 2017-08-25 NOTE — Telephone Encounter (Signed)
Pt informed of below. Rx sent. See meds. Patient has to check with her son about scheduling her f/u. She will call back to schedule.

## 2017-08-31 ENCOUNTER — Other Ambulatory Visit: Payer: Self-pay | Admitting: Family Medicine

## 2017-09-05 ENCOUNTER — Encounter (INDEPENDENT_AMBULATORY_CARE_PROVIDER_SITE_OTHER): Payer: Self-pay | Admitting: Physical Medicine and Rehabilitation

## 2017-09-05 ENCOUNTER — Ambulatory Visit (INDEPENDENT_AMBULATORY_CARE_PROVIDER_SITE_OTHER): Payer: Medicare Other

## 2017-09-05 ENCOUNTER — Ambulatory Visit (INDEPENDENT_AMBULATORY_CARE_PROVIDER_SITE_OTHER): Payer: Medicare Other | Admitting: Physical Medicine and Rehabilitation

## 2017-09-05 VITALS — BP 151/73 | HR 64 | Temp 98.0°F

## 2017-09-05 DIAGNOSIS — M5416 Radiculopathy, lumbar region: Secondary | ICD-10-CM | POA: Diagnosis not present

## 2017-09-05 MED ORDER — BETAMETHASONE SOD PHOS & ACET 6 (3-3) MG/ML IJ SUSP
12.0000 mg | Freq: Once | INTRAMUSCULAR | Status: AC
  Administered 2017-09-05: 12 mg

## 2017-09-05 NOTE — Progress Notes (Signed)
 .  Numeric Pain Rating Scale and Functional Assessment Average Pain 7   In the last MONTH (on 0-10 scale) has pain interfered with the following?  1. General activity like being  able to carry out your everyday physical activities such as walking, climbing stairs, carrying groceries, or moving a chair?  Rating(5)   +Driver, -BT, -Dye Allergies.  

## 2017-09-05 NOTE — Patient Instructions (Signed)

## 2017-09-07 ENCOUNTER — Other Ambulatory Visit: Payer: Self-pay | Admitting: Family Medicine

## 2017-09-08 ENCOUNTER — Telehealth: Payer: Self-pay

## 2017-09-08 NOTE — Telephone Encounter (Signed)
Form received for patient to get Creon from Patient assistance program. I do not see where you have given script in the past. I am holding PPW if ok I will fill out and fax in.

## 2017-09-08 NOTE — Telephone Encounter (Signed)
She stopped it, I believe. Goes through GI for this.

## 2017-09-11 NOTE — Telephone Encounter (Signed)
Ok to refill 

## 2017-09-12 ENCOUNTER — Other Ambulatory Visit: Payer: Self-pay | Admitting: Neurology

## 2017-09-12 MED ORDER — DULOXETINE HCL 30 MG PO CPEP: 30.0000 mg | ORAL_CAPSULE | Freq: Two times a day (BID) | ORAL | 2 refills | Status: DC

## 2017-09-12 NOTE — Progress Notes (Signed)
DEVYNNE STURDIVANT - 77 y.o. female MRN [id number]  Date of birth: 13-Sep-1940  Office Visit Note: Visit Date: 09/05/2017 PCP: Briscoe Deutscher, DO Referred by: Briscoe Deutscher, DO  Subjective: Chief Complaint  Patient presents with  . Lower Back - Pain  . Right Leg - Pain  . Left Leg - Pain   HPI: Mrs. Backhaus is a 77 year old female who comes in today at the request of Dr. Lorin Mercy for bilateral L3 transforaminal epidural steroid injection for adjacent level findings on MRI above her L4-S1 fusion.  She has pretty severe central canal stenosis which is multifactorial at L3-4.  She is having low back and bilateral hip and leg pain worse with standing and walking.  The injection  will be diagnostic and hopefully therapeutic. The patient has failed conservative care including time, medications and activity modification.   ROS Otherwise per HPI.  Assessment & Plan: Visit Diagnoses:  1. Lumbar radiculopathy     Plan: No additional findings.   Meds & Orders:  Meds ordered this encounter  Medications  . betamethasone acetate-betamethasone sodium phosphate (CELESTONE) injection 12 mg    Orders Placed This Encounter  Procedures  . XR C-ARM NO REPORT  . Epidural Steroid injection    Follow-up: Return if symptoms worsen or fail to improve.   Procedures: No procedures performed  Lumbosacral Transforaminal Epidural Steroid Injection - Sub-Pedicular Approach with Fluoroscopic Guidance  Patient: JOANELL CRESSLER      Date of Birth: [birth year] MRN: [id number] PCP: Briscoe Deutscher, DO      Visit Date: 09/05/2017   Universal Protocol:    Date/Time: 09/05/2017  Consent Given By: the patient  Position: PRONE  Additional Comments: Vital signs were monitored before and after the procedure. Patient was prepped and draped in the usual sterile fashion. The correct patient, procedure, and site was verified.   Injection Procedure Details:  Procedure Site One Meds Administered:  Meds ordered this  encounter  Medications  . betamethasone acetate-betamethasone sodium phosphate (CELESTONE) injection 12 mg    Laterality: Bilateral  Location/Site:  L3-L4  Needle size: 22 G  Needle type: Spinal  Needle Placement: Transforaminal  Findings:    -Comments: Excellent flow of contrast along the nerve and into the epidural space.  Procedure Details: After squaring off the end-plates to get a true AP view, the C-arm was positioned so that an oblique view of the foramen as noted above was visualized. The target area is just inferior to the "nose of the scotty dog" or sub pedicular. The soft tissues overlying this structure were infiltrated with 2-3 ml. of 1% Lidocaine without Epinephrine.  The spinal needle was inserted toward the target using a "trajectory" view along the fluoroscope beam.  Under AP and lateral visualization, the needle was advanced so it did not puncture dura and was located close the 6 O'Clock position of the pedical in AP tracterory. Biplanar projections were used to confirm position. Aspiration was confirmed to be negative for CSF and/or blood. A 1-2 ml. volume of Isovue-250 was injected and flow of contrast was noted at each level. Radiographs were obtained for documentation purposes.   After attaining the desired flow of contrast documented above, a 0.5 to 1.0 ml test dose of 0.25% Marcaine was injected into each respective transforaminal space.  The patient was observed for 90 seconds post injection.  After no sensory deficits were reported, and normal lower extremity motor function was noted,   the above injectate was administered so that equal amounts  of the injectate were placed at each foramen (level) into the transforaminal epidural space.   Additional Comments:  The patient tolerated the procedure well Dressing: Band-Aid    Post-procedure details: Patient was observed during the procedure. Post-procedure instructions were reviewed.  Patient left the clinic  in stable condition.    Clinical History: MRI LUMBAR SPINE WITHOUT CONTRAST  TECHNIQUE: Multiplanar, multisequence MR imaging of the lumbar spine was performed. No intravenous contrast was administered.  COMPARISON:  06/22/2017 CT abdomen and pelvis. 06/20/2017 lumbar spine radiographs. 04/22/2014 lumbar spine MRI.  FINDINGS: Segmentation:  Standard.  Alignment:  Physiologic.  Vertebrae: Mild edema within the L3 inferior endplate, likely degenerative. No additional bone marrow edema or disc edema identified within the lumbar spine. Chronic T10 compression deformity and L3 superior endplate Schmorl's node. L4-S1 posterior instrumented fusion and interbody fusion prostheses produces susceptibility artifact partially obscuring vertebral bodies through those levels. L4-S1 laminectomy postsurgical changes. Left sacral ala edema corresponding to chronic fracture deformity as seen on prior CT.  Conus medullaris and cauda equina: Conus extends to the L1-2 level. Conus and cauda equina appear normal.  Paraspinal and other soft tissues: L4-S1 posterior interbody fusion and laminectomy postsurgical changes. No fluid collection within the laminectomy site or epidural space identified.  Disc levels:  L1-2: Tiny central annular fissure and minimal disc bulge. No significant foraminal or canal stenosis.  L2-3: Stable small disc bulge and mild facet hypertrophy. Mild foraminal stenosis. No canal stenosis.  L3-4: Progression of disc bulge, right foraminal disc protrusion, facet, and ligamentum flavum hypertrophy. Severe right and moderate left foraminal stenosis. Severe canal stenosis.  L4-5: Laminectomy and interbody fusion. Patent spinal canal and neural foramina.  L5-S1: Laminectomy and interbody fusion. Patent spinal canal and neural foramina.  IMPRESSION: 1. Progression of L3-4 adjacent segment disease with progressive disc and facet degenerative changes  resulting in severe right moderate left foraminal stenosis and severe canal stenosis. 2. Chronic left sacral fracture with mild edema. 3. No acute osseous abnormality of the lumbar spine.   Electronically Signed   By: Kristine Garbe M.D.   On: 08/04/2017 20:36   She reports that she has never smoked. She has never used smokeless tobacco.  Recent Labs    11/22/16 1310  HGBA1C 4.3*    Objective:  VS:  HT:    WT:   BMI:     BP:(!) 151/73  HR:64bpm  TEMP:98 F (36.7 C)(Oral)  RESP:97 % Physical Exam  Musculoskeletal:  Brief exam shows good distal strength without clonus.    Ortho Exam Imaging: No results found.  Past Medical/Family/Surgical/Social History: Medications & Allergies reviewed per EMR, new medications updated. Patient Active Problem List   Diagnosis Date Noted  . Lumbar stenosis, followed by Dr. 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 Tardive dyskinesia, vitamin E   . History of stroke   . Mitral valve prolapse   . Chronic maxillary sinusitis, using NetiPot, flonase   . 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 Chronic left sacral fracture with mild edema, MRI 07/2017 01/07/2017  . Falls frequently 11/21/2016  . Abnormal radionuclide bone scan 08/16/2015  . HLD (hyperlipidemia), on Lovastatin 07/28/2015  . Fatty liver, on CT 09/09/14 09/09/2014  . Memory loss 01/08/2014  . 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 Pancreatic cancer [facility]), MRI 06/19/13 07/10/2013  . Carcinoma of head of pancreas ([place]) 07/01/2013  . Peripheral neuropathy, Lyrica 400 mg BID 08/30/2012  . 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 Colon polyps 06/05/2008  . Osteoarthritis  06/04/2008  . Chronic diarrhea, intermittent 04/22/2008  . Hypothyroidism, on Levothryoxine 02/26/2007  . Adjustment disorder with mixed anxiety and depressed mood, on Cymbalta and Buspar 02/26/2007  . Glaucoma, using Refresh tears, followed by [facility] yearly 02/26/2007  . Essential  hypertension 02/26/2007  . Allergic rhinitis 02/26/2007  . GERD, on Prilosec 02/26/2007  . Hiatal hernia 02/26/2007  . Osteoporosis, on Calcium and Vitamin D 02/26/2007   Past Medical History:  Diagnosis Date  . Abnormality of gait 03/27/2014  . Allergic rhinitis   . Ankle fracture, bimalleolar, closed 08/16/2015  . B12 deficiency 08/08/2017  . Cancer ([place]) 07/10/13   Pancreatic cancer with MRI scan 06-19-13  . Chronic maxillary sinusitis    neti pot  . Closed fracture of ramus of right pubis ([place]) 11/22/2016  . Depression    alone a lot  . Eustachian tube dysfunction   . GERD (gastroesophageal reflux disease)   . Glaucoma   . Heart murmur    hx. "MVP" -predental antibiotics  . Hiatal hernia   . Hypertension   . Hypothyroid   . Memory loss    short term memory loss  . Mitral valve prolapse    antibiotics before dental procedures  . Neuropathy   . Open Colles' fracture of right radius 11/02/2016  . Osteoarthritis   . Sacral fracture, closed ([place]) 01/07/2017  . Stroke [facility])    mini storkes left leg paraylsis. patient denies weakness 01/08/14.   . 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 Tardive dyskinesia    possibly reglan, vitamin E helps  . Urgency of urination    some UTI in past   Family History  Problem Relation Age of Onset  . Cancer Mother        Breast Cancer with Metastatic disease  . Alzheimer's disease Father   . Other Brother 106       GSW  . Hypertension Neg Hx    Past Surgical History:  Procedure Laterality Date  . 1 baker cyst removed    . ABDOMINAL HYSTERECTOMY     including ovaries  . BACK SURGERY     fusion  . BLEPHAROPLASTY Bilateral    with cataract surgery  . BREAST EXCISIONAL BIOPSY     left x2  . BREAST SURGERY     Biopsy left 2 times  . COLONOSCOPY W/ POLYPECTOMY     2004 last colonoscopy, no polyps  . DILATION AND CURETTAGE OF UTERUS     x3  . ESOPHAGOGASTRODUODENOSCOPY N/A 09/11/2013   Procedure:  ESOPHAGOGASTRODUODENOSCOPY (EGD);  Surgeon: Cleotis Nipper, MD;  Location[facility] ENDOSCOPY;  Service: Endoscopy;  Laterality: N/A;  Moderate sedation okay if MAC not available  . EUS N/A 07/10/2013   Procedure: ESOPHAGEAL ENDOSCOPIC ULTRASOUND (EUS) RADIAL;  Surgeon: Arta Silence, MD;  Location: [facility] ENDOSCOPY;  Service: Endoscopy;  Laterality: N/A;  . EYE SURGERY Right    cataract  . FINE NEEDLE ASPIRATION N/A 07/10/2013   Procedure: FINE NEEDLE ASPIRATION (FNA) LINEAR;  Surgeon: Arta Silence, MD;  Location: [facility] ENDOSCOPY;  Service: Endoscopy;  Laterality: N/A;  possible fna  . HARDWARE REMOVAL Right 12/15/2016   Procedure: Hardware removal and tenolysis right wrist with repair reconstruction as necessary;  Surgeon: Roseanne Kaufman, MD;  Location: [place];  Service: Orthopedics;  Laterality: Right;  60 mins  . JOINT REPLACEMENT     LTKA  . KNEE SURGERY Left    x 5, total knee Left knee  . LAPAROSCOPY N/A 08/07/2013   Procedure:  LAPAROSCOPY DIAGNOSTIC;  Surgeon: [name], MD;  Location: [place];  Service: General;  Laterality: N/A;  . LUMBAR SPINE SURGERY     x2  . LUMBAR SPINE SURGERY     cyst  . ORIF ANKLE FRACTURE Right 08/16/2015   Procedure: OPEN REDUCTION INTERNAL FIXATION (ORIF) ANKLE FRACTURE;  Surgeon: Meredith Pel, MD;  Location: [place];  Service: Orthopedics;  Laterality: Right;  . ORIF WRIST FRACTURE Right 11/01/2016   Procedure: OPEN REDUCTION INTERNAL FIXATION (ORIF) RIGHT WRIST FRACTURE WITH APPLICATION OF SPANNING PLATE, IRRIGATION AND DEBRIDEMENT RIGHT WRIST;  Surgeon: Roseanne Kaufman, MD;  Location: [facility];  Service: Orthopedics;  Laterality: Right;  . RADIOACTIVE SEED GUIDED EXCISIONAL BREAST BIOPSY Left 12/15/2015   Procedure: LEFT RADIOACTIVE SEED GUIDED EXCISIONAL BREAST BIOPSY;  Surgeon: [name], MD;  Location: [place];  Service: General;  Laterality: Left;  . WHIPPLE PROCEDURE N/A 08/07/2013   Procedure: WHIPPLE PROCEDURE;  Surgeon: [name], MD;  Location: [place];   Service: General;  Laterality: N/A;   Social History   Occupational History    Employer: RETIRED  . Occupation: Retired  Tobacco Use  . Smoking status: Never Smoker  . Smokeless tobacco: Never Used  Substance and Sexual Activity  . Alcohol use: No  . Drug use: No  . Sexual activity: Not Currently

## 2017-09-12 NOTE — Procedures (Signed)
Lumbosacral Transforaminal Epidural Steroid Injection - Sub-Pedicular Approach with Fluoroscopic Guidance  Patient: [name]      Date of Birth: [birth year] MRN: [id number] PCP: Briscoe Deutscher, DO      Visit Date: 09/05/2017   Universal Protocol:    Date/Time: 09/05/2017  Consent Given By: the patient  Position: PRONE  Additional Comments: Vital signs were monitored before and after the procedure. Patient was prepped and draped in the usual sterile fashion. The correct patient, procedure, and site was verified.   Injection Procedure Details:  Procedure Site One Meds Administered:  Meds ordered this encounter  Medications  . betamethasone acetate-betamethasone sodium phosphate (CELESTONE) injection 12 mg    Laterality: Bilateral  Location/Site:  L3-L4  Needle size: 22 G  Needle type: Spinal  Needle Placement: Transforaminal  Findings:    -Comments: Excellent flow of contrast along the nerve and into the epidural space.  Procedure Details: After squaring off the end-plates to get a true AP view, the C-arm was positioned so that an oblique view of the foramen as noted above was visualized. The target area is just inferior to the "nose of the scotty dog" or sub pedicular. The soft tissues overlying this structure were infiltrated with 2-3 ml. of 1% Lidocaine without Epinephrine.  The spinal needle was inserted toward the target using a "trajectory" view along the fluoroscope beam.  Under AP and lateral visualization, the needle was advanced so it did not puncture dura and was located close the 6 O'Clock position of the pedical in AP tracterory. Biplanar projections were used to confirm position. Aspiration was confirmed to be negative for CSF and/or blood. A 1-2 ml. volume of Isovue-250 was injected and flow of contrast was noted at each level. Radiographs were obtained for documentation purposes.   After attaining the desired flow of contrast documented above, a 0.5 to  1.0 ml test dose of 0.25% Marcaine was injected into each respective transforaminal space.  The patient was observed for 90 seconds post injection.  After no sensory deficits were reported, and normal lower extremity motor function was noted,   the above injectate was administered so that equal amounts of the injectate were placed at each foramen (level) into the transforaminal epidural space.   Additional Comments:  The patient tolerated the procedure well Dressing: Band-Aid    Post-procedure details: Patient was observed during the procedure. Post-procedure instructions were reviewed.  Patient left the clinic in stable condition.

## 2017-09-13 NOTE — Telephone Encounter (Signed)
Called patient informed needs to come from GI. She will come by and pick up ppw and see if they will fill out. Put ppw at reception.

## 2017-09-14 ENCOUNTER — Other Ambulatory Visit: Payer: Self-pay | Admitting: Gastroenterology

## 2017-09-14 DIAGNOSIS — K8689 Other specified diseases of pancreas: Secondary | ICD-10-CM | POA: Diagnosis not present

## 2017-09-14 DIAGNOSIS — Z8507 Personal history of malignant neoplasm of pancreas: Secondary | ICD-10-CM | POA: Diagnosis not present

## 2017-09-14 DIAGNOSIS — R1310 Dysphagia, unspecified: Secondary | ICD-10-CM

## 2017-09-14 DIAGNOSIS — R1013 Epigastric pain: Secondary | ICD-10-CM | POA: Diagnosis not present

## 2017-09-17 ENCOUNTER — Other Ambulatory Visit: Payer: Self-pay | Admitting: Endocrinology

## 2017-09-18 ENCOUNTER — Ambulatory Visit: Payer: Medicare Other | Admitting: *Deleted

## 2017-09-20 ENCOUNTER — Ambulatory Visit: Payer: Medicare Other | Admitting: Endocrinology

## 2017-09-21 ENCOUNTER — Telehealth (INDEPENDENT_AMBULATORY_CARE_PROVIDER_SITE_OTHER): Payer: Self-pay

## 2017-09-21 ENCOUNTER — Other Ambulatory Visit: Payer: Medicare Other

## 2017-09-21 NOTE — Telephone Encounter (Signed)
fyi

## 2017-09-21 NOTE — Telephone Encounter (Signed)
FYI-  Patient's son, [name] called stating that his mother was having pain in her right knee that was radiating up her right inner thigh around her hip and back.  Stated that patient saw Dr. Ernestina Patches 2 weeks ago for injection, but only lasted for about 1 week.  Scheduled appt.for 10/03/17 with Dr. Lorin Mercy and advised [name] that they could call to see if someone cancels before her appt.

## 2017-09-22 ENCOUNTER — Other Ambulatory Visit: Payer: Self-pay | Admitting: Gastroenterology

## 2017-09-22 ENCOUNTER — Ambulatory Visit
Admission: RE | Admit: 2017-09-22 | Discharge: 2017-09-22 | Disposition: A | Discharge status: Home / Self Care | Payer: Medicare Other | Source: Ambulatory Visit | Attending: Gastroenterology | Admitting: Gastroenterology

## 2017-09-22 DIAGNOSIS — R1310 Dysphagia, unspecified: Secondary | ICD-10-CM

## 2017-09-22 DIAGNOSIS — K449 Diaphragmatic hernia without obstruction or gangrene: Secondary | ICD-10-CM | POA: Diagnosis not present

## 2017-09-26 ENCOUNTER — Telehealth: Payer: Self-pay | Admitting: *Deleted

## 2017-09-26 ENCOUNTER — Ambulatory Visit (INDEPENDENT_AMBULATORY_CARE_PROVIDER_SITE_OTHER): Payer: Medicare Other | Admitting: Pharmacist Clinician (PhC)/ Clinical Pharmacy Specialist

## 2017-09-26 ENCOUNTER — Encounter: Payer: Self-pay | Admitting: Pharmacist Clinician (PhC)/ Clinical Pharmacy Specialist

## 2017-09-26 ENCOUNTER — Encounter (INDEPENDENT_AMBULATORY_CARE_PROVIDER_SITE_OTHER): Payer: Self-pay | Admitting: Orthopaedic Surgery

## 2017-09-26 ENCOUNTER — Ambulatory Visit (INDEPENDENT_AMBULATORY_CARE_PROVIDER_SITE_OTHER): Payer: Medicare Other | Admitting: Orthopaedic Surgery

## 2017-09-26 VITALS — BP 114/68 | HR 66 | Ht 61.0 in | Wt 147.0 lb

## 2017-09-26 DIAGNOSIS — Z981 Arthrodesis status: Secondary | ICD-10-CM

## 2017-09-26 DIAGNOSIS — M48062 Spinal stenosis, lumbar region with neurogenic claudication: Secondary | ICD-10-CM | POA: Diagnosis not present

## 2017-09-26 DIAGNOSIS — I10 Essential (primary) hypertension: Secondary | ICD-10-CM

## 2017-09-26 NOTE — Assessment & Plan Note (Signed)
Patient with essential hypertension, well controlled in the mornings.  Her evening readings average 30 points higher, but I believe this may have to do with her pain and need for back surgery.  She has a history of falls, and thus I do not feel that it would be appropriate to add any further blood pressure lowering medications at this time.  I suggested that the continue to monitor her home readings, but that once she gets her back surgery done and is feeling better, her evening readings should come down.

## 2017-09-26 NOTE — Telephone Encounter (Signed)
   [facility] Pre-operative Risk Assessment    Request for surgical clearance:  1. What type of surgery is being performed?  L 3-4  R TLIF. REMOVAL  OF RODS , PEDICK INSTRUMENTATION  2. When is this surgery scheduled? TBD  3. What type of clearance is required (medical clearance vs. Pharmacy clearance to hold med vs. Both)? MEDICAL  4. Are there any medications that need to be held prior to surgery and how long?  5. Practice name and name of physician performing surgery? NA  6. What is your office phone number [phone]   7.   What is your office fax number [phone]  8.   Anesthesia type (None, local, MAC, general) ? LEFT MESSAGE TO FIND OUT WHICH TYPE    [name] 09/26/2017, 3:16 PM  _________________________________________________________________   (provider comments below)

## 2017-09-26 NOTE — Patient Instructions (Addendum)
  Your blood pressure today is 142/78  Check your blood pressure at home several days each week and keep record of the readings.  Take your BP meds as follows:  Amlodipine 5mg  every evening  Atenolol 50mg  twice daily (6am and 6pm)  HCTZ 12.5mg  daily every morning  Lisinopril 40mg  daily every morning  Bring all of your meds, your BP cuff and your record of home blood pressures to your next appointment.  Exercise as you're able, try to walk approximately 30 minutes per day.  Keep salt intake to a minimum, especially watch canned and prepared boxed foods.  Eat more fresh fruits and vegetables and fewer canned items.  Avoid eating in fast food restaurants.    HOW TO TAKE YOUR BLOOD PRESSURE: . Rest 5 minutes before taking your blood pressure. .  Don't smoke or drink caffeinated beverages for at least 30 minutes before. . Take your blood pressure before (not after) you eat. . Sit comfortably with your back supported and both feet on the floor (don't cross your legs). . Elevate your arm to heart level on a table or a desk. . Use the proper sized cuff. It should fit smoothly and snugly around your bare upper arm. There should be enough room to slip a fingertip under the cuff. The bottom edge of the cuff should be 1 inch above the crease of the elbow. . Ideally, take 3 measurements at one sitting and record the average.

## 2017-09-26 NOTE — Progress Notes (Signed)
Office Visit Note   Patient: [name]           Date of Birth: 09-11-1940           MRN: [id number] Visit Date: 09/26/2017              Requested by: Briscoe Deutscher, [address], [place] [address] PCP: Briscoe Deutscher, DO   Assessment & Plan: Visit Diagnoses:  1. Spinal stenosis of lumbar region with neurogenic claudication   2. History of lumbar fusion     Plan: Patient has solid L4-S1 fusion.  Developed progressive stenosis at the L3-4 level with severe foraminal stenosis on the right neurogenic claudication symptoms and radiculopathy from foraminal stenosis.  She has severe disc space narrowing central stenosis at L3-4.  He is at increased risk for surgery due to her age and multiple medical problems and only surgical option is fusion at the L3-4 level with decompression surgery.  She has a crossbar linkage the L4 and L5 level could have linkage onto the right at that level without removing right and left rod that extend down to the sacrum.  Require pedicle screws at L3 with central decompression interbody fusion.  Require cardiology clearance.  Risk of problems with SMA stenosis with potential for bowel infarction if she experienced significant hypotension as a risk.  Risks of anesthesia stroke heart attack pulmonary failure are all discussed.  Require cardiac clearance preoperatively.  Patient's son can consider their options that they would like to proceed.  She does not have significant stenosis at the L2-3 level but she understands that with time this could be a problem at some point in the future.  Her nonoperative option is limited walking use of wheelchair for distance.  Follow-Up Instructions: No follow-ups on file.   Orders:  No orders of the defined types were placed in this encounter.  No orders of the defined types were placed in this encounter.     Procedures: No procedures performed   Clinical Data: No additional  findings.   Subjective: Chief Complaint  Patient presents with  . Lower Back - Pain  . Right Leg - Pain    HPI 77 year old female returns with severe lumbar spinal stenosis at L3-4 with neurogenic claudication.  Great difficulty standing now can only walk 10-20 steps has difficulty getting to and erect position due to back pain and right leg pain.  She gets relief with sitting and supine position.  Previous L4-S1 fusion is solid from 2011.  MRI scan shows progressive stenosis which is now severe at the L3-4 level centrally with severe right and moderate left foraminal stenosis.  Central stenosis at L3-4 is severe.  Good decompression at the previous levels L4-5 and L5-S1.  Mild narrowing at L2-3.  She is tried activity modification, epidural injections without relief most recently on 09/05/2017.  She is here with her son and states that she is ready for surgery.  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  Continued to do well after this procedure.  Patient's back pain is better with sitting or supine position.  She has to lean over her walker in order to ambulate.  Increased pain with upright ambulates with a walker with a forward flexed position.  And when she tries to stand she cannot get completely upright.  Her pain interferes with daily living activities.  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  Mitral valve prolapse history of CVA.  Tardive dyskinesia.  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  Previous L3 superior endplate fracture.  T12 compression fracture.  Right distal radius fracture with union.  Mixed anxiety and depression.  Urinary  urgency.   Objective: Vital Signs: BP 114/68   Pulse 66   Ht _0  (1.549 m)   Wt 147 lb (66.7 kg)   BMI 27.78 kg/m   Physical Exam  Constitutional: She is oriented to person, place, and time. She appears well-developed.  HENT:  Head: Normocephalic.  Right Ear: External ear normal.  Left Ear: External ear normal.  Eyes: Pupils are equal, round, and reactive to light.  Neck: No tracheal deviation present. No thyromegaly present.  Cardiovascular: Normal rate.  Pulmonary/Chest: Effort normal.  Abdominal: Soft.  Healed midline incision from 2015 Whipple procedure.  Neurological: She is alert and oriented to person, place, and time.  Skin: Skin is warm and dry.  Psychiatric: She has a normal mood and affect. Her behavior is normal. Judgment and thought content normal.  Flat affect.  Conversant, alert.    Ortho Exam patient gets from sitting to standing has trouble getting to an upright position.  Decreased sensation bilateral feet consistent with peripheral neuropathy.  Negative logroll to both hips.  Knee and ankle jerk are intact.  Anterior tib gastrocsoleus is intact.  Distal pulses are intact.  Healed midline lumbar incision.  Specialty Comments:  No specialty comments available.  Imaging: CLINICAL DATA:  77 y/o F; lower back pain radiating into the right buttocks. Pain and weakness of both legs. Symptoms have become acute 3 weeks ago. History of pancreatic cancer status post Whipple procedure.  EXAM: MRI LUMBAR SPINE WITHOUT CONTRAST  TECHNIQUE: Multiplanar, multisequence MR imaging of the lumbar spine was performed. No intravenous contrast was administered.  COMPARISON:  06/22/2017 CT abdomen and pelvis. 06/20/2017 lumbar spine radiographs. 04/22/2014 lumbar spine MRI.  FINDINGS: Segmentation:  Standard.  Alignment:  Physiologic.  Vertebrae: Mild edema within the L3 inferior endplate, likely degenerative. No additional bone marrow edema or disc  edema identified within the lumbar spine. Chronic T10 compression deformity and L3 superior endplate Schmorl's node. L4-S1 posterior instrumented fusion and interbody fusion prostheses produces susceptibility artifact partially obscuring vertebral bodies through those levels. L4-S1 laminectomy postsurgical changes. Left sacral ala edema corresponding to chronic fracture deformity as seen on prior CT.  Conus medullaris and cauda equina: Conus extends to the L1-2 level. Conus and cauda equina appear normal.  Paraspinal and other soft tissues: L4-S1 posterior interbody fusion and laminectomy postsurgical changes. No fluid collection within the laminectomy site or epidural space identified.  Disc levels:  L1-2: Tiny central annular fissure and minimal disc bulge. No significant foraminal or canal stenosis.  L2-3: Stable small disc bulge and mild facet hypertrophy. Mild foraminal stenosis. No canal stenosis.  L3-4: Progression of disc bulge, right foraminal disc protrusion, facet, and ligamentum flavum hypertrophy. Severe right and moderate left foraminal stenosis. Severe canal stenosis.  L4-5: Laminectomy and interbody fusion. Patent spinal canal and neural foramina.  L5-S1: Laminectomy and interbody fusion. Patent spinal canal and neural foramina.  IMPRESSION: 1. Progression of L3-4 adjacent segment disease with progressive disc and facet degenerative changes resulting in severe right moderate left foraminal stenosis and severe canal stenosis. 2.  Chronic left sacral fracture with mild edema. 3. No acute osseous abnormality of the lumbar spine.   Electronically Signed   By: Kristine Garbe M.D.   On: 08/04/2017 20:36    PMFS History: Patient Active Problem List   Diagnosis Date Noted  . Lumbar stenosis, followed by Dr. 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 Tardive dyskinesia, vitamin E   . History of stroke   . Mitral valve prolapse   .  Chronic maxillary sinusitis, using NetiPot, flonase   . Superior mesenteric artery stenosis ([place]) 05/21/2017  . Chronic left sacral fracture with mild edema, MRI 07/2017 01/07/2017  . Falls frequently 11/21/2016  . Abnormal radionuclide bone scan 08/16/2015  . HLD (hyperlipidemia), on Lovastatin 07/28/2015  . Fatty liver, on CT 09/09/14 09/09/2014  . Memory loss 01/08/2014  . 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 Pancreatic cancer [facility]), MRI 06/19/13 07/10/2013  . Carcinoma of head of pancreas ([place]) 07/01/2013  . Peripheral neuropathy, Lyrica 400 mg BID 08/30/2012  . 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 Colon polyps 06/05/2008  . Osteoarthritis 06/04/2008  . Chronic diarrhea, intermittent 04/22/2008  . Hypothyroidism, on Levothryoxine 02/26/2007  . Adjustment disorder with mixed anxiety and depressed mood, on Cymbalta and Buspar 02/26/2007  . Glaucoma, using Refresh tears, followed by [facility] yearly 02/26/2007  . Essential hypertension 02/26/2007  . Allergic rhinitis 02/26/2007  . GERD, on Prilosec 02/26/2007  . Hiatal hernia 02/26/2007  . Osteoporosis, on Calcium and Vitamin D 02/26/2007   Past Medical History:  Diagnosis Date  . Abnormality of gait 03/27/2014  . Allergic rhinitis   . Ankle fracture, bimalleolar, closed 08/16/2015  . B12 deficiency 08/08/2017  . Cancer ([place]) 07/10/13   Pancreatic cancer with MRI scan 06-19-13  . Chronic maxillary sinusitis    neti pot  . Closed fracture of ramus of right pubis ([place]) 11/22/2016  . Depression    alone a lot  . Eustachian tube dysfunction   . GERD (gastroesophageal reflux disease)   . Glaucoma   . Heart murmur    hx. "MVP" -predental antibiotics  . Hiatal hernia   . Hypertension   . Hypothyroid   . Memory loss    short term memory loss  . Mitral valve prolapse    antibiotics before dental procedures  . Neuropathy   . Open Colles' fracture of right radius 11/02/2016  . Osteoarthritis   . Sacral fracture, closed ([place])  01/07/2017  . Stroke [facility])    mini storkes left leg paraylsis. patient denies weakness 01/08/14.   . Superior mesenteric artery stenosis ([place]) 05/21/2017  . Tardive dyskinesia    possibly reglan, vitamin E helps  . Urgency of urination    some UTI in past    Family History  Problem Relation Age of Onset  . Cancer Mother        Breast Cancer with Metastatic disease  . Alzheimer's disease Father   . Other Brother 71       GSW  . Hypertension Neg Hx     Past Surgical History:  Procedure Laterality Date  . 1 baker cyst removed    . ABDOMINAL HYSTERECTOMY     including ovaries  . BACK SURGERY     fusion  . BLEPHAROPLASTY Bilateral    with cataract surgery  . BREAST EXCISIONAL BIOPSY     left x2  . BREAST SURGERY     Biopsy left 2 times  . COLONOSCOPY W/ POLYPECTOMY     2004 last colonoscopy, no polyps  .  DILATION AND CURETTAGE OF UTERUS     x3  . ESOPHAGOGASTRODUODENOSCOPY N/A 09/11/2013   Procedure: ESOPHAGOGASTRODUODENOSCOPY (EGD);  Surgeon: Cleotis Nipper, MD;  Location: [facility] ENDOSCOPY;  Service: Endoscopy;  Laterality: N/A;  Moderate sedation okay if MAC not available  . EUS N/A 07/10/2013   Procedure: ESOPHAGEAL ENDOSCOPIC ULTRASOUND (EUS) RADIAL;  Surgeon: Arta Silence, MD;  Location: [facility] ENDOSCOPY;  Service: Endoscopy;  Laterality: N/A;  . EYE SURGERY Right    cataract  . FINE NEEDLE ASPIRATION N/A 07/10/2013   Procedure: FINE NEEDLE ASPIRATION (FNA) LINEAR;  Surgeon: Arta Silence, MD;  Location: [facility] ENDOSCOPY;  Service: Endoscopy;  Laterality: N/A;  possible fna  . HARDWARE REMOVAL Right 12/15/2016   Procedure: Hardware removal and tenolysis right wrist with repair reconstruction as necessary;  Surgeon: Roseanne Kaufman, MD;  Location: [place];  Service: Orthopedics;  Laterality: Right;  60 mins  . JOINT REPLACEMENT     LTKA  . KNEE SURGERY Left    x 5, total knee Left knee  . LAPAROSCOPY N/A 08/07/2013   Procedure: LAPAROSCOPY DIAGNOSTIC;  Surgeon: [name], MD;   Location: [place];  Service: General;  Laterality: N/A;  . LUMBAR SPINE SURGERY     x2  . LUMBAR SPINE SURGERY     cyst  . ORIF ANKLE FRACTURE Right 08/16/2015   Procedure: OPEN REDUCTION INTERNAL FIXATION (ORIF) ANKLE FRACTURE;  Surgeon: Meredith Pel, MD;  Location: [place];  Service: Orthopedics;  Laterality: Right;  . ORIF WRIST FRACTURE Right 11/01/2016   Procedure: OPEN REDUCTION INTERNAL FIXATION (ORIF) RIGHT WRIST FRACTURE WITH APPLICATION OF SPANNING PLATE, IRRIGATION AND DEBRIDEMENT RIGHT WRIST;  Surgeon: Roseanne Kaufman, MD;  Location: [facility];  Service: Orthopedics;  Laterality: Right;  . RADIOACTIVE SEED GUIDED EXCISIONAL BREAST BIOPSY Left 12/15/2015   Procedure: LEFT RADIOACTIVE SEED GUIDED EXCISIONAL BREAST BIOPSY;  Surgeon: [name], MD;  Location: [place];  Service: General;  Laterality: Left;  . WHIPPLE PROCEDURE N/A 08/07/2013   Procedure: WHIPPLE PROCEDURE;  Surgeon: [name], MD;  Location: [place];  Service: General;  Laterality: N/A;   Social History   Occupational History    Employer: RETIRED  . Occupation: Retired  Tobacco Use  . Smoking status: Never Smoker  . Smokeless tobacco: Never Used  Substance and Sexual Activity  . Alcohol use: No  . Drug use: No  . Sexual activity: Not Currently

## 2017-09-26 NOTE — Telephone Encounter (Signed)
   Primary Cardiologist: Skeet Latch, MD  Chart reviewed as part of pre-operative protocol coverage. Patient was contacted 09/26/2017 in reference to pre-operative risk assessment for pending surgery as outlined below.  [name] was last seen on 06/13/17 by Oval Linsey.  Since that day, [name] reports several bouts of chest pain, mostly occurring this month.  Due to new or worsening symptoms, [name] will require a follow-up visit for further pre-operative risk assessment.  Pre-op covering staff: - Please schedule appointment and call patient to inform them. - Please contact requesting surgeon's office via preferred method (i.e, phone, fax) to inform them of need for appointment prior to surgery.  Please send this note to the provider who will see the patient for clearance.    [name] is followed by Dr. Oneida Alar.  [name] Lin Duke, PA 09/26/2017, 3:52 PM

## 2017-09-26 NOTE — Telephone Encounter (Signed)
PT HAS BEEN SCHEDULED FOR 09/27/17 TO SEE Kimberlee Nearing, PA

## 2017-09-26 NOTE — Progress Notes (Signed)
HPI:  [name] is a 77 y.o. female patient of Dr Oval Linsey, with a [place] below who presents today for hypertension clinic follow up.  In addition to hypertension, her medical history is significant for hyperlipidemia, mitral valve prolapse, SMA stenosis, recurrent syncope and pancreatic cancer (post pancreaticoduodenectomy).   She has poor sleep hygieine - states she goes to bed around 11pm and usually up by 5-6 am.  Her son notes that she also naps frequently throughout the day but the neuropathic pain keep her up most of the night.   Patient is a little anxious about needing to have back surgery.  She saw her orthopedic surgeon today and will have surgery scheduled once she receives cardiac clearance.  That has been submitted to the pre-op pool.  She does report some chest pain and pressure in the past week, but her son believes this may be due to some GI problems.  She had testing done a few days ago and they are waiting to her from GI.    Concern from son/patient because of normal morning readings, but elevated evening ones.  See below for averages.  Blood Pressure Goal:  130/80  Current Medications:  Amlodipine 5mg  every morning  Atenolol 50mg  twice daily (6am and 6pm)  HCTZ 12.5mg  daily every morning  Lisinopril 40mg  daily every morning  Family Hx:  Mother with cardiomyopathy, died from cancer in her 78  Father with seizure disorder, brain aneurysm in his 22s  1 brother with MI deceased, extreme obesity  2 sons w/o concerns  Social Hx:  No tobacco; no alcohol; coffee 1-2 cups most days; couple of cokes per day (12oz cans)  Diet:  Mostly home cooked meals, occasional eat out; likes fried foods and admits to adding salt to her food at the table.  Son states she adds it to her plate before even tasting her food.    Exercise:   None currently, unable to put much weight on her right leg  Home BP readings:   AM - 15 readings average 118/77  PM - 14 readings average  149/81   Wt Readings from Last 3 Encounters:  09/26/17 147 lb (66.7 kg)  08/09/17 151 lb (68.5 kg)  08/08/17 150 lb (68 kg)   BP Readings from Last 3 Encounters:  09/26/17 (!) 142/78  09/26/17 114/68  09/05/17 (!) 151/73   Pulse Readings from Last 3 Encounters:  09/26/17 72  09/26/17 66  09/05/17 64    Current Outpatient Medications  Medication Sig Dispense Refill  . acetaminophen (TYLENOL) 325 MG tablet Take 650 mg by mouth every 6 (six) hours as needed.    Marland Kitchen amLODipine (NORVASC) 5 MG tablet TAKE 1 TABLET BY MOUTH EVERY DAY 30 tablet 3  . aspirin 81 MG tablet Take 81 mg by mouth daily.    Marland Kitchen atenolol (TENORMIN) 50 MG tablet Take 1 tablet (50 mg total) by mouth 2 (two) times daily. 180 tablet 2  . busPIRone (BUSPAR) 5 MG tablet Take 1 tablet (5 mg total) by mouth 2 (two) times daily. 60 tablet 5  . Calcium Carbonate (CALCIUM 600 PO) Take 1 tablet by mouth daily.    . carboxymethylcellulose (REFRESH TEARS) 0.5 % SOLN Place 1 drop into both eyes 2 (two) times daily.     . cloNIDine (CATAPRES - DOSED IN MG/24 HR) 0.1 mg/24hr patch PLACE 1 PATCH (0.1 MG TOTAL) ONTO THE SKIN ONCE A WEEK. 4 patch 0  . DULoxetine (CYMBALTA) 30 MG  capsule Take 1 capsule (30 mg total) by mouth 2 (two) times daily. 180 capsule 2  . fluticasone (FLONASE) 50 MCG/ACT nasal spray Place 2 sprays into both nostrils daily. 16 g 6  . hydrochlorothiazide (MICROZIDE) 12.5 MG capsule Take 1 capsule (12.5 mg total) by mouth daily. 30 capsule 5  . HYDROcodone-acetaminophen (NORCO/VICODIN) 5-325 MG tablet Take 1 tablet by mouth every 12 (twelve) hours as needed for moderate pain. (Patient not taking: Reported on 09/26/2017) 30 tablet 0  . latanoprost (XALATAN) 0.005 % ophthalmic solution Place 1 drop into both eyes nightly.    . levothyroxine (SYNTHROID, LEVOTHROID) 175 MCG tablet Take 1 tablet (175 mcg total) by mouth daily before breakfast. 90 tablet 3  . lipase/protease/amylase (CREON) 12000 UNITS CPEP capsule Take 2  capsules (24,000 Units total) by mouth 3 (three) times daily before meals. 270 capsule 5  . lisinopril (PRINIVIL,ZESTRIL) 40 MG tablet Take 1 tablet (40 mg total) by mouth daily. 90 tablet 1  . lovastatin (MEVACOR) 40 MG tablet Take 1 tablet (40 mg total) by mouth at bedtime. 90 tablet 2  . methylPREDNISolone (MEDROL DOSEPAK) 4 MG TBPK tablet TAKE 6 TABLETS ON DAY 1 AS DIRECTED ON PACKAGE AND DECREASE BY 1 TAB EACH DAY FOR A TOTAL OF 6 DAYS  0  . omeprazole (PRILOSEC OTC) 20 MG tablet Take 20 mg by mouth 2 (two) times daily.     . ondansetron (ZOFRAN) 4 MG tablet Take 4 mg by mouth every 8 (eight) hours as needed for nausea or vomiting.    . polycarbophil (FIBERCON) 625 MG tablet Take 625 mg by mouth 4 (four) times daily.     . pregabalin (LYRICA) 100 MG capsule Take 1 capsule (100 mg total) by mouth 2 (two) times daily. 60 capsule 11  . pregabalin (LYRICA) 300 MG capsule Take 1 capsule (300 mg total) by mouth 2 (two) times daily. 180 capsule 1  . Probiotic Product (PROBIOTIC-10 PO) Take 1 tablet by mouth 2 (two) times daily.    . psyllium (REGULOID) 0.52 g capsule Take 0.52 g by mouth daily.    Marland Kitchen rOPINIRole (REQUIP) 0.5 MG tablet TAKE 1 TABLET BY MOUTH AT BEDTIME 30 tablet 3  . tolterodine (DETROL LA) 4 MG 24 hr capsule Take by mouth daily.  6  . vitamin E 400 UNIT capsule Take 800 Units by mouth 2 (two) times daily. For tartive dyskinesia     No current facility-administered medications for this visit.     Allergies  Allergen Reactions  . Other Other (See Comments)    According to patient Trilafor and Reglan cause Tardive Dyskinesia  . Metoclopramide Hcl Other (See Comments)    Shaking; caused tardive dyskinesia  . Niacin Other (See Comments)    shaking  . Trovan [Alatrofloxacin Mesylate] Other (See Comments)    Caused shaking and nervousness  . Benzocaine-Resorcinol Rash  . Celecoxib Other (See Comments)    unknown  . Erythromycin Base Rash  . Glucosamine Other (See Comments)     unknown  . Nortriptyline Other (See Comments)    Dizziness   . Phenazopyridine Hcl Other (See Comments)    unknown  . Sulfa Antibiotics Nausea And Vomiting and Rash  . Sulfonamide Derivatives Nausea And Vomiting and Rash    Past Medical History:  Diagnosis Date  . Abnormality of gait 03/27/2014  . Allergic rhinitis   . Ankle fracture, bimalleolar, closed 08/16/2015  . B12 deficiency 08/08/2017  . Cancer ([place]) 07/10/13   Pancreatic cancer with  MRI scan 06-19-13  . Chronic maxillary sinusitis    neti pot  . Closed fracture of ramus of right pubis ([place]) 11/22/2016  . Depression    alone a lot  . Eustachian tube dysfunction   . GERD (gastroesophageal reflux disease)   . Glaucoma   . Heart murmur    hx. "MVP" -predental antibiotics  . Hiatal hernia   . Hypertension   . Hypothyroid   . Memory loss    short term memory loss  . Mitral valve prolapse    antibiotics before dental procedures  . Neuropathy   . Open Colles' fracture of right radius 11/02/2016  . Osteoarthritis   . Sacral fracture, closed ([place]) 01/07/2017  . Stroke [facility])    mini storkes left leg paraylsis. patient denies weakness 01/08/14.   . Superior mesenteric artery stenosis ([place]) 05/21/2017  . Tardive dyskinesia    possibly reglan, vitamin E helps  . Urgency of urination    some UTI in past    Blood pressure (!) 142/78, pulse 72.   Essential hypertension Patient with essential hypertension, well controlled in the mornings.  Her evening readings average 30 points higher, but I believe this may have to do with her pain and need for back surgery.  She has a history of falls, and thus I do not feel that it would be appropriate to add any further blood pressure lowering medications at this time.  I suggested that the continue to monitor her home readings, but that once she gets her back surgery done and is feeling better, her evening readings should come down.    Tommy Medal PharmD CPP [facility] [address] [place] [address] 09/26/2017 4:57 PM

## 2017-09-27 ENCOUNTER — Ambulatory Visit (INDEPENDENT_AMBULATORY_CARE_PROVIDER_SITE_OTHER): Payer: Medicare Other | Admitting: Cardiology

## 2017-09-27 ENCOUNTER — Telehealth: Payer: Self-pay | Admitting: Cardiovascular Disease

## 2017-09-27 ENCOUNTER — Encounter: Payer: Self-pay | Admitting: Cardiology

## 2017-09-27 VITALS — BP 138/72 | HR 72 | Ht 61.0 in | Wt 151.0 lb

## 2017-09-27 DIAGNOSIS — Z87898 Personal history of other specified conditions: Secondary | ICD-10-CM | POA: Insufficient documentation

## 2017-09-27 DIAGNOSIS — I10 Essential (primary) hypertension: Secondary | ICD-10-CM | POA: Diagnosis not present

## 2017-09-27 DIAGNOSIS — Z01810 Encounter for preprocedural cardiovascular examination: Secondary | ICD-10-CM | POA: Diagnosis not present

## 2017-09-27 DIAGNOSIS — R002 Palpitations: Secondary | ICD-10-CM | POA: Diagnosis not present

## 2017-09-27 NOTE — Telephone Encounter (Signed)
Has appointment later today for clearance.

## 2017-09-27 NOTE — Assessment & Plan Note (Signed)
Holter showed PACs- no arrhythmia.

## 2017-09-27 NOTE — Assessment & Plan Note (Signed)
Controlled.  

## 2017-09-27 NOTE — Patient Instructions (Signed)
Medication Instructions:  Your physician recommends that you continue on your current medications as directed. Please refer to the Current Medication list given to you today.  Labwork: None   Testing/Procedures: None   Follow-Up: Your physician wants you to follow-up in: 6 months with Dr [place]. You will receive a reminder letter in the mail two months in advance. If you don't receive a letter, please call our office to schedule the follow-up appointment.  Any Other Special Instructions Will Be Listed Below (If Applicable). If you need a refill on your cardiac medications before your next appointment, please call your pharmacy.  

## 2017-09-27 NOTE — Assessment & Plan Note (Signed)
None in > a year. Echo Oct 2018- normal LVF Myoview jan 2019- low risk

## 2017-09-27 NOTE — Telephone Encounter (Signed)
New Message   [facility] Pre-operative Risk Assessment    Request for surgical clearance:  1. What type of surgery is being performed?  L 3-4  R TLIF. REMOVAL  OF RODS , PEDICK INSTRUMENTATION  2. When is this surgery scheduled? TBD   3. What type of clearance is required (medical clearance vs. Pharmacy clearance to hold med vs. Both)? medical  4. Are there any medications that need to be held prior to surgery and how long?   5. Practice name and name of physician performing surgery? [facility] Dr. Rodell Perna   6. What is your office phone number [phone]   7.   What is your office fax number [phone]  8.   Anesthesia type (None, local, MAC, general) ?general    Devra Dopp 09/26/2017, 3:16 PM

## 2017-09-27 NOTE — Progress Notes (Signed)
09/27/2017 [name]   [birth year]  [id number]  Primary Physician Briscoe Deutscher, DO Primary Cardiologist: Dr Oval Linsey  HPI:  Pleasant 77 y/o female seen by Dr Oval Linsey in Jan 2019. She has a history of past syncope, HTN, and palpitations. Myoview was low risk in Jan, echo in Oct showed normal LVF. A Holter done in Aug 2018 showed NSR with PACs. She needs to have back surgery and was placed on my schedule today for pre op clearance. She is accompanied by her son. She is in a wheel chair. She denies any syncope recently. She has occasional palpitations. She denies chest pain or unusual dyspnea. Her activity is limited.    Current Outpatient Medications  Medication Sig Dispense Refill  . acetaminophen (TYLENOL) 325 MG tablet Take 650 mg by mouth every 6 (six) hours as needed.    Marland Kitchen amLODipine (NORVASC) 5 MG tablet TAKE 1 TABLET BY MOUTH EVERY DAY 30 tablet 3  . aspirin 81 MG tablet Take 81 mg by mouth daily.    Marland Kitchen atenolol (TENORMIN) 50 MG tablet Take 1 tablet (50 mg total) by mouth 2 (two) times daily. 180 tablet 2  . busPIRone (BUSPAR) 5 MG tablet Take 1 tablet (5 mg total) by mouth 2 (two) times daily. 60 tablet 5  . Calcium Carbonate (CALCIUM 600 PO) Take 1 tablet by mouth daily.    . carboxymethylcellulose (REFRESH TEARS) 0.5 % SOLN Place 1 drop into both eyes 2 (two) times daily.     . cloNIDine (CATAPRES - DOSED IN MG/24 HR) 0.1 mg/24hr patch PLACE 1 PATCH (0.1 MG TOTAL) ONTO THE SKIN ONCE A WEEK. 4 patch 0  . DULoxetine (CYMBALTA) 30 MG capsule Take 1 capsule (30 mg total) by mouth 2 (two) times daily. 180 capsule 2  . fluticasone (FLONASE) 50 MCG/ACT nasal spray Place 2 sprays into both nostrils daily. 16 g 6  . hydrochlorothiazide (MICROZIDE) 12.5 MG capsule Take 1 capsule (12.5 mg total) by mouth daily. 30 capsule 5  . HYDROcodone-acetaminophen (NORCO/VICODIN) 5-325 MG tablet Take 1 tablet by mouth every 12 (twelve) hours as needed for moderate pain. (Patient not taking: Reported  on 09/26/2017) 30 tablet 0  . latanoprost (XALATAN) 0.005 % ophthalmic solution Place 1 drop into both eyes nightly.    . levothyroxine (SYNTHROID, LEVOTHROID) 175 MCG tablet Take 1 tablet (175 mcg total) by mouth daily before breakfast. 90 tablet 3  . lipase/protease/amylase (CREON) 12000 UNITS CPEP capsule Take 2 capsules (24,000 Units total) by mouth 3 (three) times daily before meals. 270 capsule 5  . lisinopril (PRINIVIL,ZESTRIL) 40 MG tablet Take 1 tablet (40 mg total) by mouth daily. 90 tablet 1  . lovastatin (MEVACOR) 40 MG tablet Take 1 tablet (40 mg total) by mouth at bedtime. 90 tablet 2  . methylPREDNISolone (MEDROL DOSEPAK) 4 MG TBPK tablet TAKE 6 TABLETS ON DAY 1 AS DIRECTED ON PACKAGE AND DECREASE BY 1 TAB EACH DAY FOR A TOTAL OF 6 DAYS  0  . omeprazole (PRILOSEC OTC) 20 MG tablet Take 20 mg by mouth 2 (two) times daily.     . ondansetron (ZOFRAN) 4 MG tablet Take 4 mg by mouth every 8 (eight) hours as needed for nausea or vomiting.    . polycarbophil (FIBERCON) 625 MG tablet Take 625 mg by mouth 4 (four) times daily.     . pregabalin (LYRICA) 100 MG capsule Take 1 capsule (100 mg total) by mouth 2 (two) times daily. 60 capsule 11  . pregabalin (  LYRICA) 300 MG capsule Take 1 capsule (300 mg total) by mouth 2 (two) times daily. 180 capsule 1  . Probiotic Product (PROBIOTIC-10 PO) Take 1 tablet by mouth 2 (two) times daily.    . psyllium (REGULOID) 0.52 g capsule Take 0.52 g by mouth daily.    Marland Kitchen rOPINIRole (REQUIP) 0.5 MG tablet TAKE 1 TABLET BY MOUTH AT BEDTIME 30 tablet 3  . tolterodine (DETROL LA) 4 MG 24 hr capsule Take by mouth daily.  6  . vitamin E 400 UNIT capsule Take 800 Units by mouth 2 (two) times daily. For tartive dyskinesia     No current facility-administered medications for this visit.     Allergies  Allergen Reactions  . Other Other (See Comments)    According to patient Trilafor and Reglan cause Tardive Dyskinesia  . Metoclopramide Hcl Other (See Comments)     Shaking; caused tardive dyskinesia  . Niacin Other (See Comments)    shaking  . Trovan [Alatrofloxacin Mesylate] Other (See Comments)    Caused shaking and nervousness  . Benzocaine-Resorcinol Rash  . Celecoxib Other (See Comments)    unknown  . Erythromycin Base Rash  . Glucosamine Other (See Comments)    unknown  . Nortriptyline Other (See Comments)    Dizziness   . Phenazopyridine Hcl Other (See Comments)    unknown  . Sulfa Antibiotics Nausea And Vomiting and Rash  . Sulfonamide Derivatives Nausea And Vomiting and Rash    Past Medical History:  Diagnosis Date  . Abnormality of gait 03/27/2014  . Allergic rhinitis   . Ankle fracture, bimalleolar, closed 08/16/2015  . B12 deficiency 08/08/2017  . Cancer ([place]) 07/10/13   Pancreatic cancer with MRI scan 06-19-13  . Chronic maxillary sinusitis    neti pot  . Closed fracture of ramus of right pubis ([place]) 11/22/2016  . Depression    alone a lot  . Eustachian tube dysfunction   . GERD (gastroesophageal reflux disease)   . Glaucoma   . Heart murmur    hx. "MVP" -predental antibiotics  . Hiatal hernia   . Hypertension   . Hypothyroid   . Memory loss    short term memory loss  . Mitral valve prolapse    antibiotics before dental procedures  . Neuropathy   . Open Colles' fracture of right radius 11/02/2016  . Osteoarthritis   . Sacral fracture, closed ([place]) 01/07/2017  . Stroke [facility])    mini storkes left leg paraylsis. patient denies weakness 01/08/14.   . 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 Tardive dyskinesia    possibly reglan, vitamin E helps  . Urgency of urination    some UTI in past    Social History   Socioeconomic History  . Marital status: Widowed    Spouse name: Not on file  . Number of children: 2  . Years of education: 8  . Highest education level: Not on file  Occupational History    Employer: RETIRED  . Occupation: Retired  Scientific laboratory technician  . Financial resource strain: Not on file  .  Food insecurity:    Worry: Not on file    Inability: Not on file  . Transportation needs:    Medical: Not on file    Non-medical: Not on file  Tobacco Use  . Smoking status: Never Smoker  . Smokeless tobacco: Never Used  Substance and Sexual Activity  . Alcohol use: No  . Drug use: No  . Sexual activity: Not Currently  Lifestyle  . Physical activity:    Days per week: Not on file    Minutes per session: Not on file  . Stress: Not on file  Relationships  . Social connections:    Talks on phone: Not on file    Gets together: Not on file    Attends religious service: Not on file    Active member of club or organization: Not on file    Attends meetings of clubs or organizations: Not on file    Relationship status: Not on file  . Intimate partner violence:    Fear of current or ex partner: Not on file    Emotionally abused: Not on file    Physically abused: Not on file    Forced sexual activity: Not on file  Other Topics Concern  . 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 Cancer Mother        Breast Cancer with Metastatic disease  . Alzheimer's disease Father   . Other Brother 1       GSW  . Hypertension Neg Hx      Review of Systems: General: negative for chills, fever, night sweats or weight changes.  Cardiovascular: negative for chest pain, dyspnea on exertion, edema, orthopnea, palpitations, paroxysmal nocturnal dyspnea or shortness of breath Dermatological: negative for rash Respiratory:  negative for cough or wheezing Urologic: negative for hematuria Abdominal: negative for nausea, vomiting, diarrhea, bright red blood per rectum, melena, or hematemesis Neurologic: negative for visual changes, syncope, or dizziness All other systems reviewed and are otherwise negative except as noted above.    Blood pressure 138/72, pulse 72, height 5\' 1"  (1.549 m), weight 151 lb (68.5 kg).  General appearance: alert, cooperative, appears stated age, no distress and in a wheel chair Neck: no carotid bruit and no JVD Lungs: clear to auscultation bilaterally Heart: regular rate and rhythm Extremities: extremities normal, atraumatic, no cyanosis or edema Skin: pale, cool, dry Neurologic: Grossly normal  EKG NSR, poor anterior RW- no change  ASSESSMENT AND PLAN:   Pre-operative cardiovascular examination Pt is cleared from a cardiac standpoint for back surgery  Palpitations Holter showed PACs- no arrhythmia.  Essential hypertension Controlled  History of syncope None in > a year. Echo Oct 2018- normal LVF Myoview jan 2019- low risk   PLAN  The pt is cleared for surgery without further cardiac work up. We will be available as needed peri op.   Kerin Ransom PA-C 09/27/2017 3:54 PM

## 2017-09-27 NOTE — Assessment & Plan Note (Signed)
Pt is cleared from a cardiac standpoint for back surgery

## 2017-09-28 ENCOUNTER — Encounter: Payer: Self-pay | Admitting: Oncology

## 2017-09-28 ENCOUNTER — Telehealth: Payer: Self-pay | Admitting: Oncology

## 2017-09-28 ENCOUNTER — Inpatient Hospital Stay: Payer: Medicare Other | Attending: Oncology | Admitting: Oncology

## 2017-09-28 VITALS — BP 114/55 | HR 65 | Temp 98.6°F | Resp 16 | Ht 61.0 in | Wt 149.8 lb

## 2017-09-28 DIAGNOSIS — C250 Malignant neoplasm of head of pancreas: Secondary | ICD-10-CM

## 2017-09-28 DIAGNOSIS — G8929 Other chronic pain: Secondary | ICD-10-CM | POA: Insufficient documentation

## 2017-09-28 DIAGNOSIS — M4800 Spinal stenosis, site unspecified: Secondary | ICD-10-CM | POA: Insufficient documentation

## 2017-09-28 DIAGNOSIS — D696 Thrombocytopenia, unspecified: Secondary | ICD-10-CM | POA: Insufficient documentation

## 2017-09-28 DIAGNOSIS — C259 Malignant neoplasm of pancreas, unspecified: Secondary | ICD-10-CM

## 2017-09-28 NOTE — Progress Notes (Signed)
  [facility] OFFICE PROGRESS NOTE   Diagnosis: Pancreas cancer  INTERVAL HISTORY:   [name] returns as scheduled.  He is here with her son.  She has pain in the lower back and right leg.  She is seeing Dr. Inda Merlin.  She has been diagnosed with spinal stenosis and surgery is being planned. She complains of "sweats "for greater than 1 year.  No fever.  Good appetite.  She has chronic pain at the abdomen radiating to the right flank region.  This predates the diagnosis of pancreas cancer.  Objective:  Vital signs in last 24 hours:  Blood pressure (!) 114/55, pulse 65, temperature 98.6 F (37 C), temperature source Oral, resp. rate 16, height 5\' 1"  (1.549 m), weight 149 lb 12.8 oz (67.9 kg), SpO2 96 %.    HEENT: Neck without mass Lymphatics: No cervical, supraclavicular, axillary, or inguinal nodes Resp: Lungs clear bilaterally Cardio: Regular rate and rhythm GI: No hepatomegaly, no apparent ascites, no mass, mild tenderness in the right subcostal region, Vascular: No leg edema  Lab Results:  Lab Results  Component Value Date   WBC 7.3 08/08/2017   HGB 12.9 08/08/2017   HCT 37.7 08/08/2017   MCV 95.2 08/08/2017   PLT 158.0 08/08/2017   NEUTROABS 4.9 08/08/2017    CMP     Component Value Date/Time   NA 136 08/08/2017 1007   NA 135 07/12/2017 1444   K 4.7 08/08/2017 1007   CL 99 08/08/2017 1007   CO2 29 08/08/2017 1007   GLUCOSE 93 08/08/2017 1007   BUN 18 08/08/2017 1007   BUN 15 07/12/2017 1444   CREATININE 0.74 08/08/2017 1007   CREATININE 0.81 07/01/2013 1324   CALCIUM 9.3 08/08/2017 1007   PROT 6.3 08/08/2017 1007   PROT 6.5 07/07/2014 1246   ALBUMIN 4.1 08/08/2017 1007   AST 24 08/08/2017 1007   ALT 25 08/08/2017 1007   ALKPHOS 128 (H) 08/08/2017 1007   BILITOT 0.5 08/08/2017 1007   GFRNONAA 82 07/12/2017 1444   GFRAA 95 07/12/2017 1444    Medications: I have reviewed the patient's current medications.   Assessment/Plan:  . 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  CT abdomen/pelvis 01/19/2015 with no evidence of recurrent/metastatic disease.  2. Nausea following the Whipple procedure-improved 3. Mild thrombocytopenia , Chronic  Disposition: [name] remains in clinical remission from pancreas cancer.  She is now greater than 4 years out from diagnosis.  She would like to continue follow-up at the Cancer center.  She will return for an office visit in 8 months.  She will follow-up with her primary physician for evaluation of the "sweats".  I think it is unlikely the sweats are related to the diagnosis of pancreas cancer.  This could be secondary to polypharmacy or menopause symptoms.  15 minutes were spent with the patient today.  The majority of the time was used for counseling and coordination of care.  Betsy Coder, MD  09/28/2017  11:39 AM

## 2017-09-28 NOTE — Telephone Encounter (Signed)
Appointments scheduled AVS/Calendar printed per 5/2 los °

## 2017-09-29 ENCOUNTER — Ambulatory Visit: Payer: Medicare Other | Admitting: Family Medicine

## 2017-09-29 ENCOUNTER — Encounter: Payer: Self-pay | Admitting: Family Medicine

## 2017-09-29 ENCOUNTER — Ambulatory Visit (INDEPENDENT_AMBULATORY_CARE_PROVIDER_SITE_OTHER): Payer: Medicare Other | Admitting: Family Medicine

## 2017-09-29 VITALS — BP 140/70 | HR 67 | Temp 98.1°F | Ht 61.0 in | Wt 150.0 lb

## 2017-09-29 DIAGNOSIS — G609 Hereditary and idiopathic neuropathy, unspecified: Secondary | ICD-10-CM

## 2017-09-29 DIAGNOSIS — E782 Mixed hyperlipidemia: Secondary | ICD-10-CM | POA: Diagnosis not present

## 2017-09-29 DIAGNOSIS — K529 Noninfective gastroenteritis and colitis, unspecified: Secondary | ICD-10-CM

## 2017-09-29 DIAGNOSIS — I10 Essential (primary) hypertension: Secondary | ICD-10-CM | POA: Diagnosis not present

## 2017-09-29 DIAGNOSIS — K219 Gastro-esophageal reflux disease without esophagitis: Secondary | ICD-10-CM

## 2017-09-29 DIAGNOSIS — E039 Hypothyroidism, unspecified: Secondary | ICD-10-CM

## 2017-09-29 DIAGNOSIS — Z01818 Encounter for other preprocedural examination: Secondary | ICD-10-CM | POA: Diagnosis not present

## 2017-09-29 DIAGNOSIS — M810 Age-related osteoporosis without current pathological fracture: Secondary | ICD-10-CM | POA: Diagnosis not present

## 2017-09-29 NOTE — Progress Notes (Signed)
[name] is a 77 y.o. female is here for follow up.  History of Present Illness:   HPI: Patient presents for evaluation.  She has an upcoming back surgery.  She already had cardiac clearance completed.  Needs medical clearance today.  Stable on all medications.  No new symptoms.  There are no preventive care reminders to display for this patient. Depression screen [facility] 2/9 08/08/2017 09/15/2016 09/13/2016  Decreased Interest 3 0 0  Down, Depressed, Hopeless 1 0 0  PHQ - 2 Score 4 0 0  Altered sleeping 3 - -  Tired, decreased energy 3 - -  Change in appetite 3 - -  Feeling bad or failure about yourself  0 - -  Trouble concentrating 1 - -  Moving slowly or fidgety/restless 1 - -  Suicidal thoughts 0 - -  PHQ-9 Score 15 - -  Difficult doing work/chores Not difficult at all - -  Some recent data might be hidden   PMHx, SurgHx, SocialHx, FamHx, Medications, and Allergies were reviewed in the Visit Navigator and updated as appropriate.   Patient Active Problem List   Diagnosis Date Noted  . Pre-operative cardiovascular examination 09/27/2017  . History of syncope 09/27/2017  . Palpitations 09/27/2017  . Lumbar stenosis, followed by Dr. 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 Tardive dyskinesia, vitamin E   . History of stroke   . Mitral valve prolapse   . Chronic maxillary sinusitis, using NetiPot, flonase   . 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 Chronic left sacral fracture with mild edema, MRI 07/2017 01/07/2017  . Falls frequently 11/21/2016  . Abnormal radionuclide bone scan 08/16/2015  . HLD (hyperlipidemia), on Lovastatin 07/28/2015  . Fatty liver, on CT 09/09/14 09/09/2014  . Memory loss 01/08/2014  . 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 Pancreatic cancer [facility]), MRI 06/19/13 07/10/2013  . Carcinoma of head of pancreas ([place]) 07/01/2013  . Peripheral neuropathy, Lyrica 400 mg BID 08/30/2012  . 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 Colon  polyps 06/05/2008  . Osteoarthritis 06/04/2008  . Chronic diarrhea, intermittent 04/22/2008  . Hypothyroidism, on Levothryoxine 02/26/2007  . Adjustment disorder with mixed anxiety and depressed mood, on Cymbalta and Buspar 02/26/2007  . Glaucoma, using Refresh tears, followed by [facility] yearly 02/26/2007  . Essential hypertension 02/26/2007  . Allergic rhinitis 02/26/2007  . GERD, on Prilosec 02/26/2007  . Hiatal hernia 02/26/2007  . Osteoporosis, on Calcium and Vitamin D 02/26/2007   Social History   Tobacco Use  . Smoking status: Never Smoker  . Smokeless tobacco: Never Used  Substance Use Topics  . Alcohol use: No  . Drug use: No   Current Medications and Allergies:   Current Outpatient Medications:  .  acetaminophen (TYLENOL) 325 MG tablet, Take 650 mg by mouth every 6 (six) hours as needed., Disp: , Rfl:  .  amLODipine (NORVASC) 5 MG tablet, TAKE 1 TABLET BY MOUTH EVERY DAY, Disp: 30 tablet, Rfl: 3 .  aspirin 81 MG tablet, Take 81 mg by mouth daily., Disp: , Rfl:  .  atenolol (TENORMIN) 50 MG tablet, Take 1 tablet (50 mg total) by mouth 2 (two) times daily., Disp: 180 tablet, Rfl: 2 .  busPIRone (BUSPAR) 5 MG tablet, Take 1 tablet (5 mg total) by mouth 2 (two) times daily., Disp: 60 tablet, Rfl: 5 .  Calcium Carbonate (CALCIUM 600 PO), Take 1 tablet by mouth daily., Disp: , Rfl:  .  carboxymethylcellulose (REFRESH TEARS) 0.5 % SOLN, Place 1 drop  into both eyes 2 (two) times daily. , Disp: , Rfl:  .  cloNIDine (CATAPRES - DOSED IN MG/24 HR) 0.1 mg/24hr patch, PLACE 1 PATCH (0.1 MG TOTAL) ONTO THE SKIN ONCE A WEEK., Disp: 4 patch, Rfl: 0 .  DULoxetine (CYMBALTA) 30 MG capsule, Take 1 capsule (30 mg total) by mouth 2 (two) times daily., Disp: 180 capsule, Rfl: 2 .  fluticasone (FLONASE) 50 MCG/ACT nasal spray, Place 2 sprays into both nostrils daily., Disp: 16 g, Rfl: 6 .  hydrochlorothiazide (MICROZIDE) 12.5 MG capsule, Take 1 capsule (12.5 mg total) by mouth daily., Disp: 30  capsule, Rfl: 5 .  HYDROcodone-acetaminophen (NORCO/VICODIN) 5-325 MG tablet, Take 1 tablet by mouth every 12 (twelve) hours as needed for moderate pain. (Patient not taking: Reported on 09/26/2017), Disp: 30 tablet, Rfl: 0 .  latanoprost (XALATAN) 0.005 % ophthalmic solution, Place 1 drop into both eyes nightly., Disp: , Rfl:  .  levothyroxine (SYNTHROID, LEVOTHROID) 175 MCG tablet, Take 1 tablet (175 mcg total) by mouth daily before breakfast., Disp: 90 tablet, Rfl: 3 .  lipase/protease/amylase (CREON) 12000 UNITS CPEP capsule, Take 2 capsules (24,000 Units total) by mouth 3 (three) times daily before meals., Disp: 270 capsule, Rfl: 5 .  lisinopril (PRINIVIL,ZESTRIL) 40 MG tablet, Take 1 tablet (40 mg total) by mouth daily., Disp: 90 tablet, Rfl: 1 .  lovastatin (MEVACOR) 40 MG tablet, Take 1 tablet (40 mg total) by mouth at bedtime., Disp: 90 tablet, Rfl: 2 .  omeprazole (PRILOSEC OTC) 20 MG tablet, Take 20 mg by mouth 2 (two) times daily. , Disp: , Rfl:  .  ondansetron (ZOFRAN) 4 MG tablet, Take 4 mg by mouth every 8 (eight) hours as needed for nausea or vomiting., Disp: , Rfl:  .  polycarbophil (FIBERCON) 625 MG tablet, Take 625 mg by mouth 4 (four) times daily. , Disp: , Rfl:  .  pregabalin (LYRICA) 300 MG capsule, Take 1 capsule (300 mg total) by mouth 2 (two) times daily., Disp: 180 capsule, Rfl: 1 .  Probiotic Product (PROBIOTIC-10 PO), Take 1 tablet by mouth 2 (two) times daily., Disp: , Rfl:  .  psyllium (REGULOID) 0.52 g capsule, Take 0.52 g by mouth daily., Disp: , Rfl:  .  rOPINIRole (REQUIP) 0.5 MG tablet, TAKE 1 TABLET BY MOUTH AT BEDTIME, Disp: 30 tablet, Rfl: 3 .  tolterodine (DETROL LA) 4 MG 24 hr capsule, Take by mouth daily., Disp: , Rfl: 6 .  vitamin E 400 UNIT capsule, Take 800 Units by mouth 2 (two) times daily. For tartive dyskinesia, Disp: , Rfl:    Allergies  Allergen Reactions  . Other Other (See Comments)    According to patient Trilafor and Reglan cause Tardive  Dyskinesia  . Metoclopramide Hcl Other (See Comments)    Shaking; caused tardive dyskinesia  . Niacin Other (See Comments)    shaking  . Trovan [Alatrofloxacin Mesylate] Other (See Comments)    Caused shaking and nervousness  . Benzocaine-Resorcinol Rash  . Celecoxib Other (See Comments)    unknown  . Erythromycin Base Rash  . Glucosamine Other (See Comments)    unknown  . Nortriptyline Other (See Comments)    Dizziness   . Phenazopyridine Hcl Other (See Comments)    unknown  . Sulfa Antibiotics Nausea And Vomiting and Rash  . Sulfonamide Derivatives Nausea And Vomiting and Rash   Review of Systems   Pertinent items are noted in the HPI. Otherwise, ROS is negative.  Vitals:   Vitals:   09/29/17  1444  BP: 140/70  Pulse: 67  Temp: 98.1 F (36.7 C)  TempSrc: Oral  SpO2: 95%  Weight: 150 lb (68 kg)  Height: 5\' 1"  (1.549 m)     Body mass index is 28.34 kg/m.   Physical Exam:   Physical Exam  Constitutional: She appears well-developed and well-nourished. No distress.  HENT:  Head: Normocephalic and atraumatic.  Eyes: Pupils are equal, round, and reactive to light. EOM are normal.  Neck: Normal range of motion. Neck supple.  Cardiovascular: Normal rate, regular rhythm and intact distal pulses.  Murmur heard. Pulmonary/Chest: Effort normal and breath sounds normal.  Abdominal: Soft. Bowel sounds are normal.  Musculoskeletal: She exhibits no edema.  In wheelchair.  Neurological: She is alert.  Skin: Skin is warm.  Psychiatric: She has a normal mood and affect. Her behavior is normal.  Nursing note and vitals reviewed.   Assessment and Plan:   Diagnoses and all orders for this visit:  Preoperative general physical examination Comments: Medically optimized for surgery.  Continue medications.  Mixed hyperlipidemia  Acquired hypothyroidism  Gastroesophageal reflux disease without esophagitis  Essential hypertension  Osteoporosis, unspecified osteoporosis  type, unspecified pathological fracture presence  Chronic diarrhea, intermittent  Peripheral neuropathy, Lyrica 400 mg BID    . Reviewed expectations re: course of current medical issues. . Discussed self-management of symptoms. . Outlined signs and symptoms indicating need for more acute intervention. . Patient verbalized understanding and all questions were answered. Marland Kitchen Health Maintenance issues including appropriate healthy diet, exercise, and smoking avoidance were discussed with patient. . See orders for this visit as documented in the electronic medical record. . Patient received an After Visit Summary.  Briscoe Deutscher, DO [facility] 09/30/2017  Future Appointments  Date Time Provider [place]  11/21/2017 10:45 AM Venancio Poisson, NP [facility] None  05/31/2018 11:30 AM Ladell Pier, MD [facility] None

## 2017-10-03 ENCOUNTER — Ambulatory Visit (INDEPENDENT_AMBULATORY_CARE_PROVIDER_SITE_OTHER): Payer: Medicare Other | Admitting: Orthopaedic Surgery

## 2017-10-11 ENCOUNTER — Ambulatory Visit (INDEPENDENT_AMBULATORY_CARE_PROVIDER_SITE_OTHER): Payer: Medicare Other | Admitting: Orthopaedic Surgery

## 2017-10-12 ENCOUNTER — Other Ambulatory Visit: Payer: Self-pay | Admitting: Endocrinology

## 2017-10-18 NOTE — Pre-Procedure Instructions (Signed)
[name]  10/18/2017      [id number] [address], [place] [address] Phone: [phone] Fax: [phone]  [place], [facility] [place] [place] [address] Phone: [phone] Fax: [phone]    Your procedure is scheduled on May 29  Report to [place] at 1030 A.M.  Call this number if you have problems the morning of surgery:  [phone]   Remember:  NOTHING TO EAT OR DRINK AFTER MIDNIGHT    Take these medicines the morning of surgery with A SIP OF WATER  acetaminophen (TYLENOL) amLODipine (NORVASC) atenolol (TENORMIN) busPIRone (BUSPAR)  Eye drops if needed DULoxetine (CYMBALTA)  fluticasone (FLONASE)  levothyroxine (SYNTHROID, LEVOTHROID) loratadine (CLARITIN)  omeprazole (PRILOSEC OTC)  pregabalin (LYRICA)  tolterodine (DETROL LA  7 days prior to surgery STOP taking any Aspirin(unless otherwise instructed by your surgeon), Aleve, Naproxen, Ibuprofen, Motrin, Advil, Goody's, BC's, all herbal medications, fish oil, and all vitamins  Follow your doctors instructions regarding your Aspirin.  If no instructions were given by your doctor, then you will need to call the prescribing office office to get instructions.      Do not wear jewelry, make-up or nail polish.  Do not wear lotions, powders, or perfumes, or deodorant.  Do not shave 48 hours prior to surgery.    Do not bring valuables to the hospital.  [facility] is not responsible for any belongings or valuables.  Contacts, dentures or bridgework may not be worn into surgery.  Leave your suitcase in the car.  After surgery it may be brought to your room.  For patients admitted to the hospital, discharge time will be determined by your treatment team.  Patients discharged the day of surgery will not be allowed to drive home.    Special instructions:  [place]-  Preparing For Surgery  Before surgery, you can play an important role. Because skin is not sterile, your skin needs to be as free of germs as possible. You can reduce the number of germs on your skin by washing with CHG (chlorahexidine gluconate) Soap before surgery.  CHG is an antiseptic cleaner which kills germs and bonds with the skin to continue killing germs even after washing.    Oral Hygiene is also important to reduce your risk of infection.  Remember - BRUSH YOUR TEETH THE MORNING OF SURGERY WITH YOUR REGULAR TOOTHPASTE  Please do not use if you have an allergy to CHG or antibacterial soaps. If your skin becomes reddened/irritated stop using the CHG.  Do not shave (including legs and underarms) for at least 48 hours prior to first CHG shower. It is OK to shave your face.  Please follow these instructions carefully.   1. Shower the NIGHT BEFORE SURGERY and the MORNING OF SURGERY with CHG.   2. If you chose to wash your hair, wash your hair first as usual with your normal shampoo.  3. After you shampoo, rinse your hair and body thoroughly to remove the shampoo.  4. Use CHG as you would any other liquid soap. You can apply CHG directly to the skin and wash gently with a scrungie or a clean washcloth.   5. Apply the CHG Soap to your body ONLY FROM THE NECK DOWN.  Do not use on open wounds or open sores. Avoid contact with your eyes, ears, mouth and genitals (private parts). Wash  Face and genitals (private parts)  with your normal soap.  6. Wash thoroughly, paying special attention to the area where your surgery will be performed.  7. Thoroughly rinse your body with warm water from the neck down.  8. DO NOT shower/wash with your normal soap after using and rinsing off the CHG Soap.  9. Pat yourself dry with a CLEAN TOWEL.  10. Wear CLEAN PAJAMAS to bed the night before surgery, wear comfortable clothes the morning of surgery  11. Place CLEAN SHEETS on your bed the night of your  first shower and DO NOT SLEEP WITH PETS.    Day of Surgery:  Do not apply any deodorants/lotions.  Please wear clean clothes to the hospital/surgery center.   Remember to brush your teeth WITH YOUR REGULAR TOOTHPASTE.   Please read over the following fact sheets that you were given.

## 2017-10-19 ENCOUNTER — Other Ambulatory Visit: Payer: Self-pay

## 2017-10-19 ENCOUNTER — Encounter (HOSPITAL_COMMUNITY)
Admission: RE | Admit: 2017-10-19 | Discharge: 2017-10-19 | Disposition: A | Discharge status: Home / Self Care | Payer: Medicare Other | Source: Ambulatory Visit | Attending: Orthopaedic Surgery | Admitting: Orthopaedic Surgery

## 2017-10-19 ENCOUNTER — Encounter (HOSPITAL_COMMUNITY): Payer: Self-pay

## 2017-10-19 DIAGNOSIS — Z01812 Encounter for preprocedural laboratory examination: Secondary | ICD-10-CM | POA: Diagnosis not present

## 2017-10-19 LAB — SURGICAL PCR SCREEN
MRSA, PCR: NEGATIVE
Staphylococcus aureus: POSITIVE — AB

## 2017-10-19 LAB — BASIC METABOLIC PANEL
ANION GAP: 9 (ref 5–15)
BUN: 12 mg/dL (ref 6–20)
CO2: 28 mmol/L (ref 22–32)
Calcium: 9.2 mg/dL (ref 8.9–10.3)
Chloride: 103 mmol/L (ref 101–111)
Creatinine, Ser: 0.73 mg/dL (ref 0.44–1.00)
GFR calc Af Amer: >60 mL/min (ref >60)
GFR calc non Af Amer: >60 mL/min (ref >60)
GLUCOSE: 87 mg/dL (ref 65–99)
POTASSIUM: 3.9 mmol/L (ref 3.5–5.1)
Sodium: 140 mmol/L (ref 135–145)

## 2017-10-19 LAB — CBC
HEMATOCRIT: 40.2 % (ref 36.0–46.0)
Hemoglobin: 13.2 g/dL (ref 12.0–15.0)
MCH: 31.8 pg (ref 26.0–34.0)
MCHC: 32.8 g/dL (ref 30.0–36.0)
MCV: 96.9 fL (ref 78.0–100.0)
Platelets: 145 10*3/uL — ABNORMAL LOW (ref 150–400)
RBC: 4.15 MIL/uL (ref 3.87–5.11)
RDW: 13.4 % (ref 11.5–15.5)
WBC: 7.3 10*3/uL (ref 4.0–10.5)

## 2017-10-19 NOTE — Progress Notes (Signed)
PCP is Dr. Hermelinda Medicus LOV 06/2017 Cardio is Dr. Skeet Latch  [place] 07/2017   Clearance note 09/29/2017 EKG 09/2017   Just had echo 09/2017.  Denies any cp, murmur currently. Neuro is Dr. Margette Fast  LOV 06/2017 Oncologist is Dr. Edrick Kins  LOV 09/2017  She is a 4 hr survivor of pancreatic cancer.  Did have whipple surgery in 2015

## 2017-10-19 NOTE — Progress Notes (Signed)
[facility] [address] [facility] of St[facility]. Spoke with pt's son, [name] and he voiced understanding.

## 2017-10-19 NOTE — Pre-Procedure Instructions (Signed)
[name]  10/19/2017      [id number] [address], [place] [address] Phone: [phone] Fax: [phone]  [place], [place] [facility] [place] [address] [place] [address] Phone: [phone] Fax: [phone]    Your procedure is scheduled on Wednesday May 29   Report to [place] at 1030 A.M.             (posted surgery time 12:30p - 4:32p)   Call this number if you have problems the morning of surgery:  [phone]   Remember:   NOTHING TO EAT OR [place] MIDNIGHT, Tuesday.  There is an exception to this rule....    Take these medicines the morning of surgery with A SIP OF WATER  acetaminophen (TYLENOL) amLODipine (NORVASC) atenolol (TENORMIN) busPIRone (BUSPAR)  Eye drops if needed DULoxetine (CYMBALTA)  fluticasone (FLONASE)  levothyroxine (SYNTHROID, LEVOTHROID) loratadine (CLARITIN)  omeprazole (PRILOSEC OTC)  pregabalin (LYRICA)  tolterodine (DETROL LA  7 days prior to surgery STOP taking any Aspirin(unless otherwise instructed by your surgeon), Aleve, Naproxen, Ibuprofen, Motrin, Advil, Goody's, BC's, all herbal medications, fish oil, and all vitamins  Follow your doctors instructions regarding your Aspirin.  If no instructions were given by your doctor, then you will need to call the prescribing office office to get instructions.      Do not wear jewelry, make-up or nail polish.  Do not wear lotions, powders, or perfumes, or deodorant.  Do not shave 48 hours prior to surgery.    Do not bring valuables to the hospital.  [facility] is not responsible for any belongings or valuables.  Contacts, dentures or bridgework may not be worn into surgery.  Leave your suitcase in the car.  After surgery it may be brought to your room.  For patients admitted to the hospital, discharge time will be determined by your treatment  team.     Special instructions:  [place]- Preparing For Surgery  Before surgery, you can play an important role. Because skin is not sterile, your skin needs to be as free of germs as possible. You can reduce the number of germs on your skin by washing with CHG (chlorahexidine gluconate) Soap before surgery.  CHG is an antiseptic cleaner which kills germs and bonds with the skin to continue killing germs even after washing.     Oral Hygiene is also important to reduce your risk of infection.   Remember - BRUSH YOUR TEETH THE MORNING OF SURGERY WITH YOUR REGULAR TOOTHPASTE   Please do not use if you have an allergy to CHG or antibacterial soaps. If your skin becomes reddened/irritated stop using the CHG.  Do not shave (including legs and underarms) for at least 48 hours prior to first CHG shower. It is OK to shave your face.  Please follow these instructions carefully.   1. Shower the NIGHT BEFORE SURGERY and the MORNING OF SURGERY with CHG.   2. If you chose to wash your hair, wash your hair first as usual with your normal shampoo.  3. After you shampoo, rinse your hair and body thoroughly to remove the shampoo.  4. Use CHG as you would any other liquid soap. You can apply CHG directly to the skin and wash gently with a scrungie or a clean washcloth.   5. Apply the CHG Soap to your body ONLY FROM THE NECK DOWN.  Do not use on open wounds or open sores. Avoid contact with your eyes, ears, mouth and genitals (private parts). Wash Face and genitals (private parts)  with your normal soap.  6. Wash thoroughly, paying special attention to the area where your surgery will be performed.  7. Thoroughly rinse your body with warm water from the neck down.  8. DO NOT shower/wash with your normal soap after using and rinsing off the CHG Soap.  9. Pat yourself dry with a CLEAN TOWEL.  10. Wear CLEAN PAJAMAS to bed the night before surgery, wear comfortable clothes the morning of  surgery  11. Place CLEAN SHEETS on your bed the night of your first shower and DO NOT SLEEP WITH PETS.    Day of Surgery:  Do not apply any deodorants/lotions.  Please wear clean clothes to the hospital/surgery center.   Remember to brush your teeth WITH YOUR REGULAR TOOTHPASTE.   Please read over the following fact sheets that you were given.
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
## 2017-10-24 ENCOUNTER — Telehealth (INDEPENDENT_AMBULATORY_CARE_PROVIDER_SITE_OTHER): Payer: Self-pay | Admitting: Orthopaedic Surgery

## 2017-10-24 NOTE — Telephone Encounter (Signed)
Please advise. OK for surgery?

## 2017-10-24 NOTE — Telephone Encounter (Signed)
I called and spoke with patient's son. He would like to know if it would be ok for her to continue aleve tonight due to pain with her leg and neuropathy.  Per Dr. Lorin Mercy, no Tori Milks tonight. She can take tylenol if she has to. I advised Roger.

## 2017-10-24 NOTE — Telephone Encounter (Signed)
Patient's son [name]) called advised his mom took aleve last night and  Patient is scheduled for surgery tomorrow at 12:30pm. The number to contact [name] is [phone]

## 2017-10-24 NOTE — Telephone Encounter (Signed)
Yes OK. thanks

## 2017-10-25 ENCOUNTER — Encounter (HOSPITAL_COMMUNITY)
Admission: RE | Disposition: A | Discharge status: Skilled Nursing Facility | Payer: Self-pay | Source: Ambulatory Visit | Attending: Orthopaedic Surgery

## 2017-10-25 ENCOUNTER — Inpatient Hospital Stay (HOSPITAL_COMMUNITY)
Admission: RE | Admit: 2017-10-25 | Discharge: 2017-10-28 | DRG: 460 | Disposition: A | Discharge status: Skilled Nursing Facility | Payer: Medicare Other | Source: Ambulatory Visit | Attending: Orthopaedic Surgery | Admitting: Orthopaedic Surgery

## 2017-10-25 ENCOUNTER — Inpatient Hospital Stay (HOSPITAL_COMMUNITY): Payer: Medicare Other | Admitting: Certified Registered"

## 2017-10-25 ENCOUNTER — Encounter (HOSPITAL_COMMUNITY): Payer: Self-pay | Admitting: Urology

## 2017-10-25 ENCOUNTER — Inpatient Hospital Stay (HOSPITAL_COMMUNITY): Payer: Medicare Other | Admitting: Vascular Surgery

## 2017-10-25 ENCOUNTER — Inpatient Hospital Stay (HOSPITAL_COMMUNITY): Payer: Medicare Other

## 2017-10-25 DIAGNOSIS — H6980 Other specified disorders of Eustachian tube, unspecified ear: Secondary | ICD-10-CM | POA: Diagnosis present

## 2017-10-25 DIAGNOSIS — E538 Deficiency of other specified B group vitamins: Secondary | ICD-10-CM | POA: Diagnosis present

## 2017-10-25 DIAGNOSIS — Z888 Allergy status to other drugs, medicaments and biological substances status: Secondary | ICD-10-CM

## 2017-10-25 DIAGNOSIS — R3915 Urgency of urination: Secondary | ICD-10-CM | POA: Diagnosis present

## 2017-10-25 DIAGNOSIS — G2401 Drug induced subacute dyskinesia: Secondary | ICD-10-CM | POA: Diagnosis present

## 2017-10-25 DIAGNOSIS — M48062 Spinal stenosis, lumbar region with neurogenic claudication: Principal | ICD-10-CM | POA: Diagnosis present

## 2017-10-25 DIAGNOSIS — Z7401 Bed confinement status: Secondary | ICD-10-CM | POA: Diagnosis not present

## 2017-10-25 DIAGNOSIS — H409 Unspecified glaucoma: Secondary | ICD-10-CM | POA: Diagnosis present

## 2017-10-25 DIAGNOSIS — E039 Hypothyroidism, unspecified: Secondary | ICD-10-CM | POA: Diagnosis present

## 2017-10-25 DIAGNOSIS — K219 Gastro-esophageal reflux disease without esophagitis: Secondary | ICD-10-CM | POA: Diagnosis present

## 2017-10-25 DIAGNOSIS — Z90411 Acquired partial absence of pancreas: Secondary | ICD-10-CM

## 2017-10-25 DIAGNOSIS — Z820 Family history of epilepsy and other diseases of the nervous system: Secondary | ICD-10-CM | POA: Diagnosis not present

## 2017-10-25 DIAGNOSIS — I10 Essential (primary) hypertension: Secondary | ICD-10-CM | POA: Diagnosis not present

## 2017-10-25 DIAGNOSIS — M2550 Pain in unspecified joint: Secondary | ICD-10-CM | POA: Diagnosis not present

## 2017-10-25 DIAGNOSIS — Z981 Arthrodesis status: Secondary | ICD-10-CM

## 2017-10-25 DIAGNOSIS — Z881 Allergy status to other antibiotic agents status: Secondary | ICD-10-CM

## 2017-10-25 DIAGNOSIS — Z87440 Personal history of urinary (tract) infections: Secondary | ICD-10-CM

## 2017-10-25 DIAGNOSIS — Z96652 Presence of left artificial knee joint: Secondary | ICD-10-CM | POA: Diagnosis present

## 2017-10-25 DIAGNOSIS — Z882 Allergy status to sulfonamides status: Secondary | ICD-10-CM

## 2017-10-25 DIAGNOSIS — J320 Chronic maxillary sinusitis: Secondary | ICD-10-CM | POA: Diagnosis present

## 2017-10-25 DIAGNOSIS — Z4789 Encounter for other orthopedic aftercare: Secondary | ICD-10-CM | POA: Diagnosis not present

## 2017-10-25 DIAGNOSIS — S3219XD Other fracture of sacrum, subsequent encounter for fracture with routine healing: Secondary | ICD-10-CM | POA: Diagnosis not present

## 2017-10-25 DIAGNOSIS — G629 Polyneuropathy, unspecified: Secondary | ICD-10-CM | POA: Diagnosis present

## 2017-10-25 DIAGNOSIS — F418 Other specified anxiety disorders: Secondary | ICD-10-CM | POA: Diagnosis present

## 2017-10-25 DIAGNOSIS — R41841 Cognitive communication deficit: Secondary | ICD-10-CM | POA: Diagnosis not present

## 2017-10-25 DIAGNOSIS — Z7982 Long term (current) use of aspirin: Secondary | ICD-10-CM

## 2017-10-25 DIAGNOSIS — I341 Nonrheumatic mitral (valve) prolapse: Secondary | ICD-10-CM | POA: Diagnosis present

## 2017-10-25 DIAGNOSIS — R2689 Other abnormalities of gait and mobility: Secondary | ICD-10-CM | POA: Diagnosis not present

## 2017-10-25 DIAGNOSIS — K760 Fatty (change of) liver, not elsewhere classified: Secondary | ICD-10-CM | POA: Diagnosis present

## 2017-10-25 DIAGNOSIS — R431 Parosmia: Secondary | ICD-10-CM | POA: Diagnosis present

## 2017-10-25 DIAGNOSIS — M1990 Unspecified osteoarthritis, unspecified site: Secondary | ICD-10-CM | POA: Diagnosis present

## 2017-10-25 DIAGNOSIS — Z8507 Personal history of malignant neoplasm of pancreas: Secondary | ICD-10-CM

## 2017-10-25 DIAGNOSIS — M810 Age-related osteoporosis without current pathological fracture: Secondary | ICD-10-CM | POA: Diagnosis present

## 2017-10-25 DIAGNOSIS — I739 Peripheral vascular disease, unspecified: Secondary | ICD-10-CM | POA: Diagnosis not present

## 2017-10-25 DIAGNOSIS — M48061 Spinal stenosis, lumbar region without neurogenic claudication: Secondary | ICD-10-CM | POA: Diagnosis not present

## 2017-10-25 DIAGNOSIS — K551 Chronic vascular disorders of intestine: Secondary | ICD-10-CM | POA: Diagnosis present

## 2017-10-25 DIAGNOSIS — Z90710 Acquired absence of both cervix and uterus: Secondary | ICD-10-CM

## 2017-10-25 DIAGNOSIS — Z419 Encounter for procedure for purposes other than remedying health state, unspecified: Secondary | ICD-10-CM

## 2017-10-25 DIAGNOSIS — Z803 Family history of malignant neoplasm of breast: Secondary | ICD-10-CM | POA: Diagnosis not present

## 2017-10-25 DIAGNOSIS — M6281 Muscle weakness (generalized): Secondary | ICD-10-CM | POA: Diagnosis not present

## 2017-10-25 DIAGNOSIS — E785 Hyperlipidemia, unspecified: Secondary | ICD-10-CM | POA: Diagnosis present

## 2017-10-25 DIAGNOSIS — M4326 Fusion of spine, lumbar region: Secondary | ICD-10-CM | POA: Diagnosis not present

## 2017-10-25 DIAGNOSIS — J309 Allergic rhinitis, unspecified: Secondary | ICD-10-CM | POA: Diagnosis present

## 2017-10-25 DIAGNOSIS — Z8673 Personal history of transient ischemic attack (TIA), and cerebral infarction without residual deficits: Secondary | ICD-10-CM

## 2017-10-25 DIAGNOSIS — K449 Diaphragmatic hernia without obstruction or gangrene: Secondary | ICD-10-CM | POA: Diagnosis present

## 2017-10-25 DIAGNOSIS — Z86010 Personal history of colonic polyps: Secondary | ICD-10-CM

## 2017-10-25 LAB — TYPE AND SCREEN
ABO/RH(D): A POS
Antibody Screen: NEGATIVE

## 2017-10-25 SURGERY — POSTERIOR LUMBAR FUSION 1 WITH HARDWARE REMOVAL
Anesthesia: General | Site: Spine Lumbar

## 2017-10-25 MED ORDER — CHLORHEXIDINE GLUCONATE 4 % EX LIQD: 60.0000 mL | Freq: Once | CUTANEOUS | Status: DC

## 2017-10-25 MED ORDER — HYDROMORPHONE HCL 2 MG/ML IJ SOLN
0.3000 mg | INTRAMUSCULAR | Status: DC | PRN
  Administered 2017-10-25 (×3): 0.5 mg via INTRAVENOUS
  Administered 2017-10-25: 0.3 mg via INTRAVENOUS

## 2017-10-25 MED ORDER — PANTOPRAZOLE SODIUM 40 MG PO TBEC
40.0000 mg | DELAYED_RELEASE_TABLET | Freq: Every day | ORAL | Status: DC
  Administered 2017-10-26 – 2017-10-28 (×3): 40 mg via ORAL
  Filled 2017-10-25 (×3): qty 1

## 2017-10-25 MED ORDER — CARBOXYMETHYLCELLULOSE SODIUM 0.5 % OP SOLN: 1.0000 [drp] | Freq: Two times a day (BID) | OPHTHALMIC | Status: DC

## 2017-10-25 MED ORDER — PSYLLIUM 95 % PO PACK
1.0000 | PACK | Freq: Every day | ORAL | Status: DC
  Administered 2017-10-26 – 2017-10-28 (×3): 1 via ORAL
  Filled 2017-10-25 (×4): qty 1

## 2017-10-25 MED ORDER — LORATADINE 10 MG PO TABS
10.0000 mg | ORAL_TABLET | Freq: Every day | ORAL | Status: DC
  Administered 2017-10-26 – 2017-10-28 (×3): 10 mg via ORAL
  Filled 2017-10-25 (×3): qty 1

## 2017-10-25 MED ORDER — CEFAZOLIN SODIUM 1 G IJ SOLR
INTRAMUSCULAR | Status: AC
  Filled 2017-10-25: qty 20

## 2017-10-25 MED ORDER — DEXMEDETOMIDINE HCL IN NACL 200 MCG/50ML IV SOLN
INTRAVENOUS | Status: AC
  Filled 2017-10-25: qty 50

## 2017-10-25 MED ORDER — MENTHOL 3 MG MT LOZG: 1.0000 | LOZENGE | OROMUCOSAL | Status: DC | PRN

## 2017-10-25 MED ORDER — HYDROCODONE-ACETAMINOPHEN 10-325 MG PO TABS
1.0000 | ORAL_TABLET | ORAL | Status: DC | PRN
  Administered 2017-10-25 – 2017-10-28 (×11): 1 via ORAL
  Filled 2017-10-25 (×11): qty 1

## 2017-10-25 MED ORDER — OMEPRAZOLE MAGNESIUM 20 MG PO TBEC: 20.0000 mg | DELAYED_RELEASE_TABLET | Freq: Two times a day (BID) | ORAL | Status: DC

## 2017-10-25 MED ORDER — DOCUSATE SODIUM 100 MG PO CAPS
100.0000 mg | ORAL_CAPSULE | Freq: Two times a day (BID) | ORAL | Status: DC
  Administered 2017-10-25 – 2017-10-28 (×6): 100 mg via ORAL
  Filled 2017-10-25 (×6): qty 1

## 2017-10-25 MED ORDER — PRAVASTATIN SODIUM 40 MG PO TABS
40.0000 mg | ORAL_TABLET | Freq: Every day | ORAL | Status: DC
  Administered 2017-10-26 – 2017-10-27 (×2): 40 mg via ORAL
  Filled 2017-10-25 (×2): qty 1

## 2017-10-25 MED ORDER — LIDOCAINE 2% (20 MG/ML) 5 ML SYRINGE
INTRAMUSCULAR | Status: AC
  Filled 2017-10-25: qty 15

## 2017-10-25 MED ORDER — ESMOLOL HCL 100 MG/10ML IV SOLN
INTRAVENOUS | Status: AC
  Filled 2017-10-25: qty 10

## 2017-10-25 MED ORDER — HYDROCHLOROTHIAZIDE 12.5 MG PO CAPS
12.5000 mg | ORAL_CAPSULE | Freq: Every day | ORAL | Status: DC
  Administered 2017-10-25 – 2017-10-28 (×4): 12.5 mg via ORAL
  Filled 2017-10-25 (×4): qty 1

## 2017-10-25 MED ORDER — SODIUM CHLORIDE 0.9 % IJ SOLN
INTRAMUSCULAR | Status: AC
  Filled 2017-10-25: qty 10

## 2017-10-25 MED ORDER — POLYETHYLENE GLYCOL 3350 17 G PO PACK: 17.0000 g | PACK | Freq: Every day | ORAL | Status: DC | PRN

## 2017-10-25 MED ORDER — FLUTICASONE PROPIONATE 50 MCG/ACT NA SUSP
2.0000 | Freq: Every day | NASAL | Status: DC
  Administered 2017-10-26 – 2017-10-28 (×3): 2 via NASAL
  Filled 2017-10-25: qty 16

## 2017-10-25 MED ORDER — COENZYME Q10 300 MG PO CAPS: 300.0000 mg | ORAL_CAPSULE | Freq: Every day | ORAL | Status: DC

## 2017-10-25 MED ORDER — ONDANSETRON HCL 4 MG/2ML IJ SOLN: 4.0000 mg | Freq: Four times a day (QID) | INTRAMUSCULAR | Status: DC | PRN

## 2017-10-25 MED ORDER — SODIUM CHLORIDE 0.9 % IV SOLN
INTRAVENOUS | Status: DC
  Administered 2017-10-25 – 2017-10-27 (×2): via INTRAVENOUS

## 2017-10-25 MED ORDER — EPHEDRINE SULFATE 50 MG/ML IJ SOLN
INTRAMUSCULAR | Status: AC
  Filled 2017-10-25: qty 1

## 2017-10-25 MED ORDER — ONDANSETRON HCL 4 MG/2ML IJ SOLN
INTRAMUSCULAR | Status: AC
  Filled 2017-10-25: qty 4

## 2017-10-25 MED ORDER — ONDANSETRON HCL 4 MG/2ML IJ SOLN
INTRAMUSCULAR | Status: DC | PRN
Start: 2017-10-25 — End: 2017-10-25
  Administered 2017-10-25: 4 mg via INTRAVENOUS

## 2017-10-25 MED ORDER — METOPROLOL TARTRATE 5 MG/5ML IV SOLN
INTRAVENOUS | Status: AC
  Filled 2017-10-25: qty 5

## 2017-10-25 MED ORDER — SIMETHICONE 80 MG PO CHEW
80.0000 mg | CHEWABLE_TABLET | Freq: Three times a day (TID) | ORAL | Status: DC
  Administered 2017-10-25 – 2017-10-28 (×8): 80 mg via ORAL
  Filled 2017-10-25 (×8): qty 1

## 2017-10-25 MED ORDER — DEXAMETHASONE SODIUM PHOSPHATE 10 MG/ML IJ SOLN
INTRAMUSCULAR | Status: DC | PRN
  Administered 2017-10-25: 10 mg via INTRAVENOUS

## 2017-10-25 MED ORDER — ROPINIROLE HCL 0.5 MG PO TABS
0.5000 mg | ORAL_TABLET | Freq: Every day | ORAL | Status: DC
  Administered 2017-10-25 – 2017-10-27 (×3): 0.5 mg via ORAL
  Filled 2017-10-25 (×3): qty 1

## 2017-10-25 MED ORDER — THROMBIN (RECOMBINANT) 20000 UNITS EX SOLR
CUTANEOUS | Status: AC
  Filled 2017-10-25: qty 20000

## 2017-10-25 MED ORDER — PROPOFOL 10 MG/ML IV BOLUS
INTRAVENOUS | Status: DC | PRN
  Administered 2017-10-25: 150 mg via INTRAVENOUS

## 2017-10-25 MED ORDER — PANCRELIPASE (LIP-PROT-AMYL) 24000-76000 UNITS PO CPEP: 1.0000 | ORAL_CAPSULE | Freq: Two times a day (BID) | ORAL | Status: DC

## 2017-10-25 MED ORDER — ROCURONIUM BROMIDE 100 MG/10ML IV SOLN
INTRAVENOUS | Status: DC | PRN
  Administered 2017-10-25: 50 mg via INTRAVENOUS
  Administered 2017-10-25: 10 mg via INTRAVENOUS

## 2017-10-25 MED ORDER — FENTANYL CITRATE (PF) 100 MCG/2ML IJ SOLN
INTRAMUSCULAR | Status: DC | PRN
  Administered 2017-10-25: 100 ug via INTRAVENOUS
  Administered 2017-10-25 (×3): 25 ug via INTRAVENOUS

## 2017-10-25 MED ORDER — DEXAMETHASONE SODIUM PHOSPHATE 10 MG/ML IJ SOLN
INTRAMUSCULAR | Status: AC
  Filled 2017-10-25: qty 2

## 2017-10-25 MED ORDER — PHENYLEPHRINE 40 MCG/ML (10ML) SYRINGE FOR IV PUSH (FOR BLOOD PRESSURE SUPPORT)
PREFILLED_SYRINGE | INTRAVENOUS | Status: AC
  Filled 2017-10-25: qty 20

## 2017-10-25 MED ORDER — EPHEDRINE SULFATE 50 MG/ML IJ SOLN
INTRAMUSCULAR | Status: DC | PRN
  Administered 2017-10-25: 10 mg via INTRAVENOUS
  Administered 2017-10-25: 5 mg via INTRAVENOUS
  Administered 2017-10-25: 10 mg via INTRAVENOUS

## 2017-10-25 MED ORDER — SUGAMMADEX SODIUM 200 MG/2ML IV SOLN
INTRAVENOUS | Status: DC | PRN
  Administered 2017-10-25: 200 mg via INTRAVENOUS

## 2017-10-25 MED ORDER — HYDROMORPHONE HCL 2 MG/ML IJ SOLN
INTRAMUSCULAR | Status: AC
  Filled 2017-10-25: qty 1

## 2017-10-25 MED ORDER — ALBUMIN HUMAN 5 % IV SOLN
INTRAVENOUS | Status: DC | PRN
  Administered 2017-10-25 (×2): via INTRAVENOUS

## 2017-10-25 MED ORDER — BUPIVACAINE HCL (PF) 0.25 % IJ SOLN
INTRAMUSCULAR | Status: AC
  Filled 2017-10-25: qty 30

## 2017-10-25 MED ORDER — SODIUM CHLORIDE 0.9 % IV SOLN: 250.0000 mL | INTRAVENOUS | Status: DC

## 2017-10-25 MED ORDER — POLYVINYL ALCOHOL 1.4 % OP SOLN
1.0000 [drp] | OPHTHALMIC | Status: DC | PRN
  Administered 2017-10-28: 1 [drp] via OPHTHALMIC
  Filled 2017-10-25: qty 15

## 2017-10-25 MED ORDER — THROMBIN (RECOMBINANT) 5000 UNITS EX SOLR
CUTANEOUS | Status: AC
  Filled 2017-10-25: qty 5000

## 2017-10-25 MED ORDER — CEFAZOLIN SODIUM-DEXTROSE 1-4 GM/50ML-% IV SOLN
1.0000 g | Freq: Three times a day (TID) | INTRAVENOUS | Status: AC
  Administered 2017-10-26 (×2): 1 g via INTRAVENOUS
  Filled 2017-10-25 (×2): qty 50

## 2017-10-25 MED ORDER — ACETAMINOPHEN 325 MG PO TABS
650.0000 mg | ORAL_TABLET | ORAL | Status: DC | PRN
  Administered 2017-10-26: 650 mg via ORAL
  Filled 2017-10-25: qty 2

## 2017-10-25 MED ORDER — LACTATED RINGERS IV SOLN
INTRAVENOUS | Status: DC
Start: 2017-10-25 — End: 2017-10-28
  Administered 2017-10-25: 11:00:00 via INTRAVENOUS

## 2017-10-25 MED ORDER — CEFAZOLIN SODIUM-DEXTROSE 2-4 GM/100ML-% IV SOLN
2.0000 g | INTRAVENOUS | Status: AC
  Administered 2017-10-25 (×2): 2 g via INTRAVENOUS

## 2017-10-25 MED ORDER — FENTANYL CITRATE (PF) 250 MCG/5ML IJ SOLN
INTRAMUSCULAR | Status: AC
  Filled 2017-10-25: qty 5

## 2017-10-25 MED ORDER — FESOTERODINE FUMARATE ER 8 MG PO TB24
8.0000 mg | ORAL_TABLET | Freq: Every day | ORAL | Status: DC
  Administered 2017-10-26 – 2017-10-28 (×3): 8 mg via ORAL
  Filled 2017-10-25 (×3): qty 1

## 2017-10-25 MED ORDER — HYDROMORPHONE HCL 2 MG/ML IJ SOLN: 0.2500 mg | INTRAMUSCULAR | Status: DC | PRN

## 2017-10-25 MED ORDER — PREGABALIN 100 MG PO CAPS
300.0000 mg | ORAL_CAPSULE | Freq: Two times a day (BID) | ORAL | Status: DC
  Administered 2017-10-25 – 2017-10-28 (×6): 300 mg via ORAL
  Filled 2017-10-25 (×6): qty 3

## 2017-10-25 MED ORDER — LIDOCAINE HCL (CARDIAC) PF 100 MG/5ML IV SOSY
PREFILLED_SYRINGE | INTRAVENOUS | Status: DC | PRN
  Administered 2017-10-25: 60 mg via INTRAVENOUS

## 2017-10-25 MED ORDER — ATENOLOL 50 MG PO TABS
50.0000 mg | ORAL_TABLET | Freq: Two times a day (BID) | ORAL | Status: DC
  Administered 2017-10-25 – 2017-10-28 (×5): 50 mg via ORAL
  Filled 2017-10-25 (×6): qty 1

## 2017-10-25 MED ORDER — PHENYLEPHRINE HCL 10 MG/ML IJ SOLN
INTRAMUSCULAR | Status: DC | PRN
  Administered 2017-10-25: 100 ug via INTRAVENOUS

## 2017-10-25 MED ORDER — CALCIUM CARBONATE-VITAMIN D 500-200 MG-UNIT PO TABS
1.0000 | ORAL_TABLET | Freq: Every day | ORAL | Status: DC
  Administered 2017-10-26 – 2017-10-28 (×3): 1 via ORAL
  Filled 2017-10-25 (×4): qty 1

## 2017-10-25 MED ORDER — ARTIFICIAL TEARS OPHTHALMIC OINT
TOPICAL_OINTMENT | OPHTHALMIC | Status: AC
  Filled 2017-10-25: qty 3.5

## 2017-10-25 MED ORDER — SUCCINYLCHOLINE CHLORIDE 200 MG/10ML IV SOSY
PREFILLED_SYRINGE | INTRAVENOUS | Status: AC
  Filled 2017-10-25: qty 30

## 2017-10-25 MED ORDER — CEFAZOLIN SODIUM-DEXTROSE 2-4 GM/100ML-% IV SOLN
INTRAVENOUS | Status: AC
  Filled 2017-10-25: qty 100

## 2017-10-25 MED ORDER — BUPIVACAINE HCL (PF) 0.25 % IJ SOLN
INTRAMUSCULAR | Status: DC | PRN
  Administered 2017-10-25: 16 mL

## 2017-10-25 MED ORDER — SODIUM CHLORIDE 0.9% FLUSH: 3.0000 mL | INTRAVENOUS | Status: DC | PRN

## 2017-10-25 MED ORDER — CLONIDINE HCL 0.1 MG/24HR TD PTWK
0.1000 mg | MEDICATED_PATCH | TRANSDERMAL | Status: DC
  Administered 2017-10-25: 0.1 mg via TRANSDERMAL
  Filled 2017-10-25: qty 1

## 2017-10-25 MED ORDER — ACETAMINOPHEN 650 MG RE SUPP: 650.0000 mg | RECTAL | Status: DC | PRN

## 2017-10-25 MED ORDER — AMLODIPINE BESYLATE 5 MG PO TABS
5.0000 mg | ORAL_TABLET | Freq: Every day | ORAL | Status: DC
  Administered 2017-10-26 – 2017-10-28 (×2): 5 mg via ORAL
  Filled 2017-10-25 (×4): qty 1

## 2017-10-25 MED ORDER — DULOXETINE HCL 30 MG PO CPEP
30.0000 mg | ORAL_CAPSULE | Freq: Two times a day (BID) | ORAL | Status: DC
  Administered 2017-10-25 – 2017-10-28 (×6): 30 mg via ORAL
  Filled 2017-10-25 (×6): qty 1

## 2017-10-25 MED ORDER — LATANOPROST 0.005 % OP SOLN
1.0000 [drp] | Freq: Every day | OPHTHALMIC | Status: DC
  Administered 2017-10-25 – 2017-10-27 (×3): 1 [drp] via OPHTHALMIC
  Filled 2017-10-25: qty 2.5

## 2017-10-25 MED ORDER — SODIUM CHLORIDE 0.9% FLUSH
3.0000 mL | Freq: Two times a day (BID) | INTRAVENOUS | Status: DC
  Administered 2017-10-26 – 2017-10-28 (×2): 3 mL via INTRAVENOUS

## 2017-10-25 MED ORDER — ROCURONIUM BROMIDE 10 MG/ML (PF) SYRINGE
PREFILLED_SYRINGE | INTRAVENOUS | Status: AC
  Filled 2017-10-25: qty 15

## 2017-10-25 MED ORDER — LEVOTHYROXINE SODIUM 75 MCG PO TABS
175.0000 ug | ORAL_TABLET | Freq: Every day | ORAL | Status: DC
  Administered 2017-10-26 – 2017-10-28 (×3): 175 ug via ORAL
  Filled 2017-10-25 (×4): qty 1

## 2017-10-25 MED ORDER — KETOROLAC TROMETHAMINE 30 MG/ML IJ SOLN
INTRAMUSCULAR | Status: AC
  Filled 2017-10-25: qty 1

## 2017-10-25 MED ORDER — PSYLLIUM 0.52 G PO CAPS: 1.0400 g | ORAL_CAPSULE | Freq: Every day | ORAL | Status: DC

## 2017-10-25 MED ORDER — ONDANSETRON HCL 4 MG PO TABS: 4.0000 mg | ORAL_TABLET | Freq: Four times a day (QID) | ORAL | Status: DC | PRN

## 2017-10-25 MED ORDER — 0.9 % SODIUM CHLORIDE (POUR BTL) OPTIME
TOPICAL | Status: DC | PRN
Start: 2017-10-25 — End: 2017-10-25
  Administered 2017-10-25: 1000 mL

## 2017-10-25 MED ORDER — PHENYLEPHRINE HCL 10 MG/ML IJ SOLN
INTRAVENOUS | Status: DC | PRN
  Administered 2017-10-25: 50 ug/min via INTRAVENOUS

## 2017-10-25 MED ORDER — PROBIOTIC-10 PO CAPS: ORAL_CAPSULE | Freq: Two times a day (BID) | ORAL | Status: DC

## 2017-10-25 MED ORDER — CALCIUM CARB-CHOLECALCIFEROL 600-800 MG-UNIT PO TABS: 1.0000 | ORAL_TABLET | Freq: Every day | ORAL | Status: DC

## 2017-10-25 MED ORDER — PANCRELIPASE (LIP-PROT-AMYL) 12000-38000 UNITS PO CPEP
24000.0000 [IU] | ORAL_CAPSULE | Freq: Two times a day (BID) | ORAL | Status: DC
  Administered 2017-10-26 – 2017-10-28 (×4): 24000 [IU] via ORAL
  Filled 2017-10-25 (×6): qty 2

## 2017-10-25 MED ORDER — VITAMIN D 1000 UNITS PO TABS
1000.0000 [IU] | ORAL_TABLET | Freq: Two times a day (BID) | ORAL | Status: DC
  Administered 2017-10-25 – 2017-10-28 (×6): 1000 [IU] via ORAL
  Filled 2017-10-25 (×7): qty 1

## 2017-10-25 MED ORDER — SUGAMMADEX SODIUM 200 MG/2ML IV SOLN
INTRAVENOUS | Status: AC
  Filled 2017-10-25: qty 2

## 2017-10-25 MED ORDER — BUSPIRONE HCL 5 MG PO TABS
5.0000 mg | ORAL_TABLET | Freq: Two times a day (BID) | ORAL | Status: DC
  Administered 2017-10-25 – 2017-10-27 (×4): 5 mg via ORAL
  Filled 2017-10-25 (×5): qty 1

## 2017-10-25 MED ORDER — PHENOL 1.4 % MT LIQD: 1.0000 | OROMUCOSAL | Status: DC | PRN

## 2017-10-25 SURGICAL SUPPLY — 77 items
BANDAGE ACE 6X5 VEL STRL LF (GAUZE/BANDAGES/DRESSINGS) IMPLANT
BENZOIN TINCTURE PRP APPL 2/3 (GAUZE/BANDAGES/DRESSINGS) ×3 IMPLANT
BLADE CLIPPER SURG (BLADE) IMPLANT
BLADE SURG 10 STRL SS (BLADE) ×3 IMPLANT
BUR ROUND FLUTED 4 SOFT TCH (BURR) ×2 IMPLANT
BUR ROUND FLUTED 4MM SOFT TCH (BURR) ×1
CAP SPINAL LOCKING TI (Cap) ×6 IMPLANT
CLOSURE STERI-STRIP 1/2X4 (GAUZE/BANDAGES/DRESSINGS) ×1
CLSR STERI-STRIP ANTIMIC 1/2X4 (GAUZE/BANDAGES/DRESSINGS) ×2 IMPLANT
CONNECTOR EXPEDIUM TI 55MM (Connector) ×6 IMPLANT
CORDS BIPOLAR (ELECTRODE) ×3 IMPLANT
COVER BACK TABLE 80X110 HD (DRAPES) ×3 IMPLANT
COVER SURGICAL LIGHT HANDLE (MISCELLANEOUS) ×3 IMPLANT
DERMABOND ADVANCED (GAUZE/BANDAGES/DRESSINGS) ×2
DERMABOND ADVANCED .7 DNX12 (GAUZE/BANDAGES/DRESSINGS) ×1 IMPLANT
DRAPE C-ARM 42X72 X-RAY (DRAPES) ×3 IMPLANT
DRAPE HALF SHEET 40X57 (DRAPES) ×12 IMPLANT
DRAPE MICROSCOPE LEICA (MISCELLANEOUS) ×6 IMPLANT
DRAPE ORTHO SPLIT 77X108 STRL (DRAPES) ×2
DRAPE SURG 17X23 STRL (DRAPES) ×9 IMPLANT
DRAPE SURG ORHT 6 SPLT 77X108 (DRAPES) ×1 IMPLANT
DRSG EMULSION OIL 3X3 NADH (GAUZE/BANDAGES/DRESSINGS) ×3 IMPLANT
DRSG MEPILEX BORDER 4X4 (GAUZE/BANDAGES/DRESSINGS) ×3 IMPLANT
DRSG MEPILEX BORDER 4X8 (GAUZE/BANDAGES/DRESSINGS) ×3 IMPLANT
DRSG PAD ABDOMINAL 8X10 ST (GAUZE/BANDAGES/DRESSINGS) ×6 IMPLANT
DURAPREP 26ML APPLICATOR (WOUND CARE) ×3 IMPLANT
ELECT BLADE 4.0 EZ CLEAN MEGAD (MISCELLANEOUS) ×3
ELECT CAUTERY BLADE 6.4 (BLADE) ×3 IMPLANT
ELECT REM PT RETURN 9FT ADLT (ELECTROSURGICAL) ×3
ELECTRODE BLDE 4.0 EZ CLN MEGD (MISCELLANEOUS) ×1 IMPLANT
ELECTRODE REM PT RTRN 9FT ADLT (ELECTROSURGICAL) ×1 IMPLANT
EVACUATOR 1/8 PVC DRAIN (DRAIN) ×3 IMPLANT
GLOVE BIOGEL PI IND STRL 8 (GLOVE) ×2 IMPLANT
GLOVE BIOGEL PI INDICATOR 8 (GLOVE) ×4
GLOVE ORTHO TXT STRL SZ7.5 (GLOVE) ×6 IMPLANT
GOWN STRL REUS W/ TWL LRG LVL3 (GOWN DISPOSABLE) ×1 IMPLANT
GOWN STRL REUS W/ TWL XL LVL3 (GOWN DISPOSABLE) ×1 IMPLANT
GOWN STRL REUS W/TWL 2XL LVL3 (GOWN DISPOSABLE) ×3 IMPLANT
GOWN STRL REUS W/TWL LRG LVL3 (GOWN DISPOSABLE) ×2
GOWN STRL REUS W/TWL XL LVL3 (GOWN DISPOSABLE) ×2
HEMOSTAT SURGICEL 2X14 (HEMOSTASIS) IMPLANT
KIT BASIN OR (CUSTOM PROCEDURE TRAY) ×3 IMPLANT
KIT TURNOVER KIT B (KITS) ×3 IMPLANT
MANIFOLD NEPTUNE II (INSTRUMENTS) ×3 IMPLANT
MATRIX HEMOSTAT SURGIFLO (HEMOSTASIS) ×3 IMPLANT
NEEDLE HYPO 25X1 1.5 SAFETY (NEEDLE) ×3 IMPLANT
NEEDLE SPNL 18GX3.5 QUINCKE PK (NEEDLE) ×9 IMPLANT
NS IRRIG 1000ML POUR BTL (IV SOLUTION) ×3 IMPLANT
PACK LAMINECTOMY ORTHO (CUSTOM PROCEDURE TRAY) ×3 IMPLANT
PAD ARMBOARD 7.5X6 YLW CONV (MISCELLANEOUS) ×6 IMPLANT
PATTIES SURGICAL .5 X.5 (GAUZE/BANDAGES/DRESSINGS) ×6 IMPLANT
PATTIES SURGICAL .75X.75 (GAUZE/BANDAGES/DRESSINGS) IMPLANT
ROD 40MM (Rod) ×2 IMPLANT
ROD 45MM (Rod) ×2 IMPLANT
ROD SPNL CVD 40X5.5XHRD NS (Rod) ×1 IMPLANT
ROD SPNL CVD 45X5.5XHRD NS (Rod) ×1 IMPLANT
SCREW MATRIX MIS 6.0X45MM (Screw) ×6 IMPLANT
SPACER TPAL 10X8X28MM (Spacer) ×3 IMPLANT
SPONGE LAP 18X18 X RAY DECT (DISPOSABLE) IMPLANT
SPONGE LAP 4X18 RFD (DISPOSABLE) IMPLANT
SPONGE SURGIFOAM ABS GEL SZ50 (HEMOSTASIS) IMPLANT
STAPLER VISISTAT 35W (STAPLE) IMPLANT
SURGIFLO W/THROMBIN 8M KIT (HEMOSTASIS) ×3 IMPLANT
SUT BONE WAX W31G (SUTURE) ×3 IMPLANT
SUT VIC AB 0 CT1 27 (SUTURE) ×2
SUT VIC AB 0 CT1 27XBRD ANBCTR (SUTURE) ×1 IMPLANT
SUT VIC AB 1 CTX 36 (SUTURE) ×2
SUT VIC AB 1 CTX36XBRD ANBCTR (SUTURE) ×1 IMPLANT
SUT VIC AB 2-0 CT1 27 (SUTURE) ×2
SUT VIC AB 2-0 CT1 TAPERPNT 27 (SUTURE) ×1 IMPLANT
SUT VIC AB 3-0 X1 27 (SUTURE) ×3 IMPLANT
TAP CANN 5MM (TAP) ×3 IMPLANT
TOWEL OR 17X24 6PK STRL BLUE (TOWEL DISPOSABLE) ×3 IMPLANT
TOWEL OR 17X26 10 PK STRL BLUE (TOWEL DISPOSABLE) ×3 IMPLANT
TRAY FOLEY MTR SLVR 16FR STAT (SET/KITS/TRAYS/PACK) ×3 IMPLANT
WATER STERILE IRR 1000ML POUR (IV SOLUTION) ×3 IMPLANT
YANKAUER SUCT BULB TIP NO VENT (SUCTIONS) ×3 IMPLANT

## 2017-10-25 NOTE — Interval H&P Note (Signed)
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 After consideration of risks, benefits and other options for treatment, the patient has consented to  Procedure(s): L3-4 RIGHT TRANSFORAMINAL LUMBAR INTERBODY FUSION, REMOVAL RODS, PEDICLE INSTRUMENTATION, CAGE (N/A) as a surgical intervention .  The patient's history has been reviewed, patient examined, no change in status, stable for surgery.  I have reviewed the patient's chart and labs.  Questions were answered to the patient's satisfaction.     [name]

## 2017-10-25 NOTE — Transfer of Care (Signed)
Immediate Anesthesia Transfer of Care Note  Patient: [name]  Procedure(s) Performed: POSTERIOR SEGMENTAL HARDWARE REMOVAL, L3-4 RIGHT TRANSFORAMINAL LUMBAR INTERBODY FUSION AND PEDICLE INSTRUMENTATION, CAGE (N/A Spine Lumbar)  Patient Location: PACU  Anesthesia Type:General  Level of Consciousness: awake, alert , oriented and patient cooperative  Airway & Oxygen Therapy: Patient Spontanous Breathing and Patient connected to face mask oxygen  Post-op Assessment: Report given to RN and Post -op Vital signs reviewed and stable  Post vital signs: Reviewed and stable  Last Vitals:  Vitals Value Taken Time  BP 122/66 10/25/2017  5:55 PM  Temp    Pulse 80 10/25/2017  6:00 PM  Resp 21 10/25/2017  6:00 PM  SpO2 97 % 10/25/2017  6:00 PM  Vitals shown include unvalidated device data.  Last Pain:  Vitals:   10/25/17 1059  TempSrc:   PainSc: 0-No pain         Complications: No apparent anesthesia complications

## 2017-10-25 NOTE — H&P (Signed)
History and physical exam 09/26/17  Editor: [name], MD (Physician)      Office Visit Note              Patient: [name]                                           Date of Birth: June 01, 1940                                                      MRN: [id number] Visit Date: 09/26/2017                                                                     Requested by: Briscoe Deutscher, [address] [place], [place] [address] PCP: Briscoe Deutscher, DO   Assessment & Plan: Visit Diagnoses:  1. Spinal stenosis of lumbar region with neurogenic claudication   2. History of lumbar fusion     Plan: Patient has solid L4-S1 fusion.  Developed progressive stenosis at the L3-4 level with severe foraminal stenosis on the right neurogenic claudication symptoms and radiculopathy from foraminal stenosis.  She has severe disc space narrowing central stenosis at L3-4.  He is at increased risk for surgery due to her age and multiple medical problems and only surgical option is fusion at the L3-4 level with decompression surgery.  She has a crossbar linkage the L4 and L5 level could have linkage onto the right at that level without removing right and left rod that extend down to the sacrum.  Require pedicle screws at L3 with central decompression interbody fusion.  Require cardiology clearance.  Risk of problems with SMA stenosis with potential for bowel infarction if she experienced significant hypotension as a risk.  Risks of anesthesia stroke heart attack pulmonary failure are all discussed.  Require cardiac clearance preoperatively.  Patient's son can consider their options that they would like to proceed.  She does not have significant stenosis at the L2-3 level but she understands that with time this could be a problem at some point in the future.  Her nonoperative option is limited walking use of wheelchair for distance.  Follow-Up Instructions: No follow-ups on file.   Orders:  No orders  of the defined types were placed in this encounter.  No orders of the defined types were placed in this encounter.     Procedures: No procedures performed   Clinical Data: No additional findings.   Subjective:    Chief Complaint  Patient presents with  . Lower Back - Pain  . Right Leg - Pain    HPI 77 year old female returns with severe lumbar spinal stenosis at L3-4 with neurogenic claudication.  Great difficulty standing now can only walk 10-20 steps has difficulty getting to and erect position due to back pain and right leg pain.  She gets relief with sitting and supine position.  Previous L4-S1 fusion is solid from 2011.  MRI scan shows progressive stenosis which is  now severe at the L3-4 level centrally with severe right and moderate left foraminal stenosis.  Central stenosis at L3-4 is severe.  Good decompression at the previous levels L4-5 and L5-S1.  Mild narrowing at L2-3.  She is tried activity modification, epidural injections without relief most recently on 09/05/2017.  She is here with her son and states that she is ready for surgery.  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  Continued to do well after this procedure.  Patient's back pain is better with sitting or supine position.  She has to lean over her walker in order to ambulate.  Increased pain with upright ambulates with a walker with a forward flexed position.  And when she tries to stand she cannot get completely upright.  Her pain interferes with daily living activities.  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  Mitral valve prolapse history of CVA.  Tardive dyskinesia.  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  Previous L3 superior endplate fracture.  T12 compression fracture.  Right distal radius fracture with union.  Mixed anxiety and depression.  Urinary urgency.   Objective: Vital Signs: BP 114/68   Pulse 66   Ht _0  (1.549 m)   Wt 147 lb (66.7 kg)   BMI 27.78 kg/m   Physical Exam  Constitutional: She is oriented to person, place, and time. She appears well-developed.  HENT:  Head: Normocephalic.  Right Ear: External ear normal.  Left Ear: External ear normal.  Eyes: Pupils are equal, round, and reactive to light.  Neck: No tracheal deviation present. No thyromegaly present.  Cardiovascular: Normal rate.  Pulmonary/Chest: Effort normal.  Abdominal: Soft.  Healed midline incision from 2015 Whipple procedure.  Neurological: She is alert and oriented to person, place, and time.  Skin: Skin is warm and dry.  Psychiatric: She has a normal mood and affect. Her behavior is normal. Judgment and thought content normal.  Flat affect.  Conversant, alert.    Ortho Exam patient gets from sitting to standing has trouble getting to an upright position.  Decreased sensation bilateral feet consistent with peripheral neuropathy.  Negative logroll to both hips.  Knee and ankle jerk are intact.  Anterior tib gastrocsoleus is intact.  Distal pulses are intact.  Healed midline lumbar incision.  Specialty Comments:  No specialty comments available.  Imaging: CLINICAL DATA: 77 y/o F; lower back pain radiating into the right buttocks. Pain and weakness of both legs. Symptoms have become acute 3 weeks ago. History of pancreatic cancer status post Whipple procedure.  EXAM: MRI LUMBAR SPINE WITHOUT CONTRAST  TECHNIQUE: Multiplanar, multisequence MR imaging of the lumbar spine was performed. No intravenous contrast was administered.  COMPARISON: 06/22/2017 CT abdomen and pelvis. 06/20/2017 lumbar spine radiographs. 04/22/2014 lumbar  spine MRI.  FINDINGS: Segmentation: Standard.  Alignment: Physiologic.  Vertebrae: Mild edema within the L3 inferior endplate, likely degenerative. No additional bone marrow edema or disc edema identified within the lumbar spine. Chronic T10 compression deformity and L3 superior endplate Schmorl's node. L4-S1 posterior instrumented fusion and interbody fusion prostheses produces susceptibility artifact partially obscuring vertebral bodies through those levels. L4-S1 laminectomy postsurgical changes. Left sacral ala edema corresponding to chronic fracture deformity as seen on prior CT.  Conus medullaris and cauda equina: Conus extends to the L1-2 level. Conus and cauda equina appear normal.  Paraspinal and other soft tissues: L4-S1 posterior interbody fusion and laminectomy postsurgical changes. No fluid collection within the laminectomy site or epidural space identified.  Disc levels:  L1-2: Tiny central annular fissure and minimal disc bulge. No significant foraminal or canal stenosis.  L2-3: Stable small disc bulge and mild facet hypertrophy. Mild foraminal stenosis. No canal stenosis.  L3-4: Progression of disc bulge, right foraminal disc protrusion, facet, and ligamentum flavum hypertrophy. Severe right and moderate left foraminal stenosis. Severe canal stenosis.  L4-5: Laminectomy and interbody fusion. Patent spinal canal and neural foramina.  L5-S1: Laminectomy and interbody fusion. Patent spinal canal and neural foramina.  IMPRESSION: 1. Progression of L3-4 adjacent segment disease with progressive disc and facet degenerative changes resulting in severe right moderate left foraminal stenosis and severe canal stenosis. 2. Chronic left sacral fracture with mild edema. 3. No acute osseous abnormality of the lumbar spine.   Electronically Signed By: Kristine Garbe M.D. On: 08/04/2017 20:36    PMFS History:     Patient  Active Problem List   Diagnosis Date Noted  . Lumbar stenosis, followed by Dr. 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 Tardive dyskinesia, vitamin E   . History of stroke   . Mitral valve prolapse   . Chronic maxillary sinusitis, using NetiPot, flonase   . Superior mesenteric artery stenosis ([place]) 05/21/2017  . Chronic left sacral fracture with mild edema, MRI 07/2017 01/07/2017  . Falls frequently 11/21/2016  . Abnormal radionuclide bone scan 08/16/2015  . HLD (hyperlipidemia), on Lovastatin 07/28/2015  . Fatty liver, on CT 09/09/14 09/09/2014  . Memory loss 01/08/2014  . 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 Pancreatic cancer [facility]), MRI 06/19/13 07/10/2013  . Carcinoma of head of pancreas ([place]) 07/01/2013  . Peripheral neuropathy, Lyrica 400 mg BID 08/30/2012  . 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 Colon polyps 06/05/2008  . Osteoarthritis 06/04/2008  . Chronic diarrhea, intermittent 04/22/2008  . Hypothyroidism, on Levothryoxine 02/26/2007  . Adjustment disorder with mixed anxiety and depressed mood, on Cymbalta and Buspar 02/26/2007  . Glaucoma, using Refresh tears, followed by [facility] yearly 02/26/2007  . Essential hypertension 02/26/2007  . Allergic rhinitis 02/26/2007  . GERD, on Prilosec 02/26/2007  . Hiatal hernia 02/26/2007  . Osteoporosis, on Calcium and Vitamin D 02/26/2007       Past Medical History:  Diagnosis Date  . Abnormality of gait 03/27/2014  . Allergic rhinitis   . Ankle fracture, bimalleolar, closed 08/16/2015  . B12 deficiency 08/08/2017  . Cancer ([place]) 07/10/13   Pancreatic cancer with MRI scan 06-19-13  . Chronic maxillary sinusitis    neti pot  . Closed fracture of ramus of right pubis ([place]) 11/22/2016  . Depression    alone a lot  . Eustachian tube dysfunction   . GERD (gastroesophageal reflux disease)   . Glaucoma   . Heart murmur    hx. "MVP" -predental antibiotics  . Hiatal hernia   . Hypertension   . Hypothyroid     . Memory loss    short term memory loss  . Mitral valve prolapse    antibiotics before dental procedures  . Neuropathy   . Open Colles' fracture of right radius 11/02/2016  . Osteoarthritis   . Sacral fracture, closed ([place]) 01/07/2017  . Stroke [facility])    mini storkes left leg paraylsis. patient denies weakness 01/08/14.   . Superior mesenteric artery stenosis ([place]) 05/21/2017  .  Tardive dyskinesia    possibly reglan, vitamin E helps  . Urgency of urination    some UTI in past         Family History  Problem Relation Age of Onset  . Cancer Mother        Breast Cancer with Metastatic disease  . Alzheimer's disease Father   . Other Brother 34       GSW  . Hypertension Neg Hx          Past Surgical History:  Procedure Laterality Date  . 1 baker cyst removed    . ABDOMINAL HYSTERECTOMY     including ovaries  . BACK SURGERY     fusion  . BLEPHAROPLASTY Bilateral    with cataract surgery  . BREAST EXCISIONAL BIOPSY     left x2  . BREAST SURGERY     Biopsy left 2 times  . COLONOSCOPY W/ POLYPECTOMY     2004 last colonoscopy, no polyps  . DILATION AND CURETTAGE OF UTERUS     x3  . ESOPHAGOGASTRODUODENOSCOPY N/A 09/11/2013   Procedure: ESOPHAGOGASTRODUODENOSCOPY (EGD);  Surgeon: Cleotis Nipper, MD;  Location: [facility] ENDOSCOPY;  Service: Endoscopy;  Laterality: N/A;  Moderate sedation okay if MAC not available  . EUS N/A 07/10/2013   Procedure: ESOPHAGEAL ENDOSCOPIC ULTRASOUND (EUS) RADIAL;  Surgeon: Arta Silence, MD;  Location: [facility] ENDOSCOPY;  Service: Endoscopy;  Laterality: N/A;  . EYE SURGERY Right    cataract  . FINE NEEDLE ASPIRATION N/A 07/10/2013   Procedure: FINE NEEDLE ASPIRATION (FNA) LINEAR;  Surgeon: Arta Silence, MD;  Location: [facility] ENDOSCOPY;  Service: Endoscopy;  Laterality: N/A;  possible fna  . HARDWARE REMOVAL Right 12/15/2016   Procedure: Hardware removal and tenolysis right wrist with repair reconstruction as  necessary;  Surgeon: Roseanne Kaufman, MD;  Location: [place];  Service: Orthopedics;  Laterality: Right;  60 mins  . JOINT REPLACEMENT     LTKA  . KNEE SURGERY Left    x 5, total knee Left knee  . LAPAROSCOPY N/A 08/07/2013   Procedure: LAPAROSCOPY DIAGNOSTIC;  Surgeon: [name], MD;  Location: [place];  Service: General;  Laterality: N/A;  . LUMBAR SPINE SURGERY     x2  . LUMBAR SPINE SURGERY     cyst  . ORIF ANKLE FRACTURE Right 08/16/2015   Procedure: OPEN REDUCTION INTERNAL FIXATION (ORIF) ANKLE FRACTURE;  Surgeon: Meredith Pel, MD;  Location: [place];  Service: Orthopedics;  Laterality: Right;  . ORIF WRIST FRACTURE Right 11/01/2016   Procedure: OPEN REDUCTION INTERNAL FIXATION (ORIF) RIGHT WRIST FRACTURE WITH APPLICATION OF SPANNING PLATE, IRRIGATION AND DEBRIDEMENT RIGHT WRIST;  Surgeon: Roseanne Kaufman, MD;  Location: [facility];  Service: Orthopedics;  Laterality: Right;  . RADIOACTIVE SEED GUIDED EXCISIONAL BREAST BIOPSY Left 12/15/2015   Procedure: LEFT RADIOACTIVE SEED GUIDED EXCISIONAL BREAST BIOPSY;  Surgeon: [name], MD;  Location: [place];  Service: General;  Laterality: Left;  . WHIPPLE PROCEDURE N/A 08/07/2013   Procedure: WHIPPLE PROCEDURE;  Surgeon: [name], MD;  Location: [place];  Service: General;  Laterality: N/A;   Social History        Occupational History    Employer: RETIRED  . Occupation: Retired  Tobacco Use  . Smoking status: Never Smoker  . Smokeless tobacco: Never Used  Substance and Sexual Activity  . Alcohol use: No  . Drug use: No  . Sexual activity: Not Currently          Communications  CHL Provider CC Chart Rep sent to Briscoe Deutscher, DO  Media   Electronic signature on 09/26/2017 9:08 AM - Signed

## 2017-10-25 NOTE — OR Nursing (Addendum)
Removed hardware cleaned intraoperatively with hydrogen peroxide and sent with patient to PACU

## 2017-10-25 NOTE — Progress Notes (Signed)
PHARMACIST - PHYSICIAN ORDER COMMUNICATION  CONCERNING: P&T Medication Policy on Herbal Medications  DESCRIPTION:  This patient's order for:  Co-Q 10 and probiotic  has been noted.  This product(s) is classified as an "herbal" or natural product. Due to a lack of definitive safety studies or FDA approval, nonstandard manufacturing practices, plus the potential risk of unknown drug-drug interactions while on inpatient medications, the Pharmacy and Therapeutics Committee does not permit the use of "herbal" or natural products of this type within [facility].   ACTION TAKEN: The pharmacy department is unable to verify this order at this time and your patient has been informed of this safety policy. Please reevaluate patient's clinical condition at discharge and address if the herbal or natural product(s) should be resumed at that time.  Doylene Canard, PharmD Clinical Pharmacist  Pager: [phone] Phone: [phone]

## 2017-10-25 NOTE — Progress Notes (Signed)
Pt arrived to 5N 25 in bed. Patient has hardware from surgery in room with her.

## 2017-10-25 NOTE — Op Note (Signed)
Preop diagnosis: L3-4 spinal stenosis above previous L4-S1 solid instrumented fusion with lateral recess and foraminal stenosis.  Postop diagnosis: Same  Procedure: Removal of posterior instrumentation, right L3-4 transforaminal interbody fusion with local bone,T-PAL 8 mm  banana-shaped cage packed with local bone.  Pedicle instrumentation with 6 x 45 mm Matrix pedicle screws.  Universal connectors with 40 and 45 mm rods.  Bilateral lateral intertransverse process fusion using local bone.Synthes Depuy screws.  Surgeon: Rodell Perna, MD  Assistant: Benjiman Core, PA-C medically necessary and present for the entire procedure  EBL: Approximately 500 cc.  Anesthesia General plus Marcaine local.  Patient had previous L4-S1 fusion with surgery done in 2011.  She had progressive stenosis with severe right leg weakness repetitive falling with a history of sacral fracture plus rami fracture from falling from her leg weakness.  MRI showed severe spinal stenosis above the solid L4-S1 fusion.  She had had previous Biomet pedicle instrumentation.  Preoperative plan was discussed concerning linking the previous rod with decompression additional fusion and pedicle instrumentation with new pedicle screws at L3 for stabilization of implanted cage.  Procedure after induction general anesthesia preoperative antibiotics timeout procedure with patient prone chest rolls careful padding calf pumpers back was prepped with DuraPrep the usual towels were used to square the area old incision was outlined with a sterile skin marker extended proximally.  Laminectomy sheets and drapes were used.  Midline incision was made sub-proximal dissection onto the spinous process at L3.  Following spinous process laterally to the facet and then distal to the next facet the top of the rod on each side was visualized.  Patient had a cross connector for the segmental posterior instrumentation.  Cross connector rod was meticulously cleaned of soft  tissue cleaning the screw holes and then using the universal removal set the crossbar linkage was removed.  Neck soft tissue was cleaned meticulously over the top of the rod between the pedicle screws at L4 and L5.  Central decompression was then performed microscope was used for visualization due to the patient's previous surgery.  Bone was removed taking small pieces of bone and progression removing thick chunks of ligament for central decompression of the severe stenosis and lateral recess stenosis.  Disc was visualized in the right side facet was removed and a [place] 4 was placed in the disc confirming the appropriate level for fusion.  Discectomy was performed cleaning out the disc with straight pituitary up pituitaries ring curettes angled curettes angled ring curettes rasps for preparation of both endplates.  Initially dilator was used followed by an 78mm dilator.  Trial sizer 7 and then 8 and 8 gave a nice tight fit and restoration of disc space height and open the foramina.  The bone that have been removed from the posterior lamina spinous process and the facet on the right side was meticulously cleaned of soft bone and to be in prepared into small pieces.  Is meticulous he packed in the disc space progressive angled Packers and then using the 7 trial to make sure that the cage would fit once bone packed anteriorly cage was inserted checked under C arm.  The nerve root coming out of the L3 pedicle was immediately adjacent to cage and had to be carefully protected.  Cage was advanced until just before it was flushed with endplate checked under C arm and then kicked over to the midline.  It was countersunk bone was visualized anterior to the cage and there was good stability.  Pedicle  screws were then placed.  Identifying the transverse process using the bur using the awl checking under fluoroscopy using the joystick progressing down by hand on the pedicle check in with a pedicle feeler checking under C arm  with the joystick in position followed by tapping feeling with the joystick feeling the medial wall the pedicle with the [facility] and inferior aspect of the [place] on each of the pedicles screw placement.  Once screws are in good place the universal connectors were attached where the transverse connector had been previously removed that gave space between the L4 and L5 screws.  It was attached angled medially and then 2 rods 40 and 45 mm were selected placed through so at least 1 mm sitting on each hand and once the universal connectors were tightened down with the torque wrench with clicking then compression first on the right side with a cage was inserted followed by the left gently to the patient's soft bone with compression and torque wrench was used.  Final spot pictures were taken AP and lateral as well as angled lordotic view.  Bone was meticulously packed and before each screw been placed the transverse process had been decorticated with the bur bone was packed over this for lateral fusion additionally to the patient's soft bone.  Gutters were checked to make sure there was adequate decompression no remaining spurs.  Microscope was used for this and make sure that there was no breach of the medial inferior wall of the pedicle with the screw.  Dura was well decompressed was a nice round tube and was intact copious irrigation.  Closure with #1 Vicryl 2-0 Vicryl skin closure postop dressing and transferred to recovery room.

## 2017-10-25 NOTE — Anesthesia Procedure Notes (Signed)
Procedure Name: Intubation Date/Time: 10/25/2017 1:27 PM Performed by: Cleda Daub, CRNA Pre-anesthesia Checklist: Patient identified, Emergency Drugs available, Suction available and Patient being monitored Patient Re-evaluated:Patient Re-evaluated prior to induction Oxygen Delivery Method: Circle system utilized Preoxygenation: Pre-oxygenation with 100% oxygen Induction Type: IV induction Ventilation: Mask ventilation without difficulty and Mask ventilation throughout procedure Laryngoscope Size: Mac and 3 Grade View: Grade I Tube type: Oral Tube size: 7.0 mm Airway Equipment and Method: Stylet Placement Confirmation: ETT inserted through vocal cords under direct vision,  positive ETCO2 and breath sounds checked- equal and bilateral Secured at: 19 cm Tube secured with: Tape Dental Injury: Teeth and Oropharynx as per pre-operative assessment

## 2017-10-25 NOTE — Anesthesia Preprocedure Evaluation (Addendum)
Anesthesia Evaluation  Patient identified by MRN, date of birth, ID band Patient awake    Reviewed: Allergy & Precautions, NPO status , Patient's Chart, lab work & pertinent test results  Airway Mallampati: II  TM Distance: >3 FB     Dental   Pulmonary neg pulmonary ROS,    breath sounds clear to auscultation       Cardiovascular hypertension, + Peripheral Vascular Disease  + Valvular Problems/Murmurs  Rhythm:Regular Rate:Normal     Neuro/Psych    GI/Hepatic Neg liver ROS, hiatal hernia, GERD  ,  Endo/Other  Hypothyroidism   Renal/GU      Musculoskeletal   Abdominal   Peds  Hematology   Anesthesia Other Findings   Reproductive/Obstetrics                            Anesthesia Physical Anesthesia Plan  ASA: III  Anesthesia Plan: General   Post-op Pain Management:    Induction: Intravenous  PONV Risk Score and Plan: 3 and Treatment may vary due to age or medical condition, Ondansetron, Dexamethasone and Midazolam  Airway Management Planned: Oral ETT  Additional Equipment:   Intra-op Plan:   Post-operative Plan: Possible Post-op intubation/ventilation  Informed Consent: I have reviewed the patients History and Physical, chart, labs and discussed the procedure including the risks, benefits and alternatives for the proposed anesthesia with the patient or authorized representative who has indicated his/her understanding and acceptance.   Dental advisory given  Plan Discussed with:   Anesthesia Plan Comments:         Anesthesia Quick Evaluation

## 2017-10-26 LAB — BASIC METABOLIC PANEL
Anion gap: 11 (ref 5–15)
BUN: 10 mg/dL (ref 6–20)
CALCIUM: 8 mg/dL — AB (ref 8.9–10.3)
CO2: 26 mmol/L (ref 22–32)
CREATININE: 0.67 mg/dL (ref 0.44–1.00)
Chloride: 104 mmol/L (ref 101–111)
GFR calc non Af Amer: >60 mL/min (ref >60)
GLUCOSE: 122 mg/dL — AB (ref 65–99)
Potassium: 4.6 mmol/L (ref 3.5–5.1)
Sodium: 141 mmol/L (ref 135–145)

## 2017-10-26 LAB — CBC
HEMATOCRIT: 26.6 % — AB (ref 36.0–46.0)
HEMOGLOBIN: 8.8 g/dL — AB (ref 12.0–15.0)
MCH: 32.4 pg (ref 26.0–34.0)
MCHC: 33.1 g/dL (ref 30.0–36.0)
MCV: 97.8 fL (ref 78.0–100.0)
Platelets: 104 10*3/uL — ABNORMAL LOW (ref 150–400)
RBC: 2.72 MIL/uL — ABNORMAL LOW (ref 3.87–5.11)
RDW: 13.7 % (ref 11.5–15.5)
WBC: 8.7 10*3/uL (ref 4.0–10.5)

## 2017-10-26 NOTE — Anesthesia Postprocedure Evaluation (Signed)
Anesthesia Post Note  Patient: [name]  Procedure(s) Performed: POSTERIOR SEGMENTAL HARDWARE REMOVAL, L3-4 RIGHT TRANSFORAMINAL LUMBAR INTERBODY FUSION AND PEDICLE INSTRUMENTATION, CAGE (N/A Spine Lumbar)     Patient location during evaluation: PACU Anesthesia Type: General Level of consciousness: awake and alert Pain management: pain level controlled Vital Signs Assessment: post-procedure vital signs reviewed and stable Respiratory status: spontaneous breathing, nonlabored ventilation, respiratory function stable and patient connected to nasal cannula oxygen Cardiovascular status: blood pressure returned to baseline and stable Postop Assessment: no apparent nausea or vomiting Anesthetic complications: no    Last Vitals:  Vitals:   10/26/17 0500 10/26/17 1502  BP: (!) 116/53 (!) 130/107  Pulse: 69 82  Resp:  16  Temp: 36.5 C   SpO2: 99% 92%    Last Pain:  Vitals:   10/26/17 1147  TempSrc:   PainSc: 4                  Lavona Norsworthy

## 2017-10-26 NOTE — Progress Notes (Signed)
Spoke with ortho tech about applying Aspen Lumbar brace and he stated that it will be ordered in the morning for the patient due to it being supplied form an outside company.

## 2017-10-26 NOTE — Evaluation (Signed)
Occupational Therapy Evaluation Patient Details Name: MIO SCHELLINGER MRN: [id number] DOB: 01-22-1941 Today's Date: 10/26/2017    History of Present Illness Pt s/p POSTERIOR SEGMENTAL HARDWARE REMOVAL, L3-4 RIGHT TRANSFORAMINAL LUMBAR INTERBODY FUSION AND PEDICLE INSTRUMENTATION per Dr Lorin Mercy.     Clinical Impression   Patient is s/p back surgery resulting in the deficits listed below (see OT Problem List).  Patient will benefit from skilled OT to increase their safety and independence with ADL and functional mobility for ADL (while adhering to their precautions) to facilitate discharge to venue listed below.      Follow Up Recommendations  SNF    Equipment Recommendations  None recommended by OT    Recommendations for Other Services       Precautions / Restrictions Precautions Precautions: Back Required Braces or Orthoses: Spinal Brace Spinal Brace: Lumbar corset;Applied in supine position      Mobility Bed Mobility Overal bed mobility: Needs Assistance Bed Mobility: Rolling;Sidelying to Sit Rolling: Mod assist Sidelying to sit: Max assist       General bed mobility comments: VC for transition and sequencing  Transfers Overall transfer level: Needs assistance Equipment used: Rolling walker (2 wheeled) Transfers: Sit to/from [facility] Sit to Stand: Mod assist Stand pivot transfers: Mod assist       General transfer comment: VC or transition and sequencing        ADL either performed or assessed with clinical judgement   ADL Overall ADL's : Needs assistance/impaired Eating/Feeding: Set up;Sitting   Grooming: Sitting;Set up   Upper Body Bathing: Minimal assistance;Sitting   Lower Body Bathing: Maximal assistance;Sit to/from stand;Cueing for sequencing;Cueing for safety;Cueing for back precautions   Upper Body Dressing : Minimal assistance;Sitting   Lower Body Dressing: Maximal assistance;Sit to/from stand;Cueing for sequencing;Cueing for  safety;Cueing for back precautions   Toilet Transfer: Moderate assistance;Stand-pivot;RW Toilet Transfer Details (indicate cue type and reason): bed to chair Toileting- Clothing Manipulation and Hygiene: Maximal assistance;Sit to/from stand;Cueing for sequencing;Cueing for safety;Adhering to back precautions         General ADL Comments: pt lives alone and will need ST SNF- pt agrees     Vision Patient Visual Report: No change from baseline              Pertinent Vitals/Pain Pain Assessment: 0-10 Pain Score: 7  Pain Descriptors / Indicators: Burning Pain Intervention(s): Limited activity within patient's tolerance;Repositioned;Monitored during session;Patient requesting pain meds-RN notified     Hand Dominance     Extremity/Trunk Assessment Upper Extremity Assessment Upper Extremity Assessment: Generalized weakness           Communication Communication Communication: No difficulties   Cognition Arousal/Alertness: Awake/alert Behavior During Therapy: WFL for tasks assessed/performed Overall Cognitive Status: Within Functional Limits for tasks assessed                                     General Comments   pt want to go to [facility] for rehab            Home Living Family/patient expects to be discharged to:: Skilled nursing facility Living Arrangements: Alone Available Help at Discharge: Family   Home Access: Stairs to enter     Home Layout: One level     Bathroom Shower/Tub: Teacher, early years/pre: Standard     Home Equipment: Cane - single point;Walker - 2 wheels  Prior Functioning/Environment Level of Independence: Independent with assistive device(s)                 OT Problem List: Decreased strength;Decreased activity tolerance;Impaired balance (sitting and/or standing);Decreased knowledge of precautions;Decreased knowledge of use of DME or AE;Pain      OT Treatment/Interventions: Self-care/ADL  training;Patient/family education;DME and/or AE instruction    OT Goals(Current goals can be found in the care plan section) Acute Rehab OT Goals Patient Stated Goal: go to rehab OT Goal Formulation: With patient Time For Goal Achievement: 11/09/17 Potential to Achieve Goals: Good ADL Goals Pt Will Perform Grooming: with supervision;standing Pt Will Perform Upper Body Dressing: with set-up;sitting Pt Will Perform Lower Body Dressing: with set-up;sit to/from stand Pt Will Transfer to Toilet: with supervision;ambulating Pt Will Perform Toileting - Clothing Manipulation and hygiene: with supervision;sit to/from stand  OT Frequency: Min 2X/week   Barriers to D/C: Decreased caregiver support             AM-PAC PT "6 Clicks" Daily Activity     Outcome Measure Help from another person eating meals?: A Little Help from another person taking care of personal grooming?: A Little Help from another person toileting, which includes using toliet, bedpan, or urinal?: A Lot Help from another person bathing (including washing, rinsing, drying)?: A Lot Help from another person to put on and taking off regular upper body clothing?: A Little Help from another person to put on and taking off regular lower body clothing?: A Lot 6 Click Score: 15   End of Session Equipment Utilized During Treatment: Rolling walker;Back brace Nurse Communication: Mobility status  Activity Tolerance: Patient tolerated treatment well Patient left: in chair;with call bell/phone within reach  OT Visit Diagnosis: Unsteadiness on feet (R26.81);Repeated falls (R29.6);History of falling (Z91.81);Pain;Muscle weakness (generalized) (M62.81);Other abnormalities of gait and mobility (R26.89)                Time: 9373-4287 OT Time Calculation (min): 16 min Charges:  OT General Charges $OT Visit: 1 Visit OT Evaluation $OT Eval Moderate Complexity: 1 Mod G-Codes:     Kari Baars, [place]  Payton Mccallum  D 10/26/2017, 2:09 PM

## 2017-10-26 NOTE — Evaluation (Addendum)
Physical Therapy Evaluation Patient Details Name: [name] MRN: [id number] DOB: 1941-03-16 Today's Date: 10/26/2017   History of Present Illness  77 y.o. female admitted on 10/25/17 for posterior segmental hardware removal, L3-4 R transforaminal lumbar interbody fusion and pedicle insturmentation.  Pt with significant PMH of urinary urgency, tardive dyskinesia, stroke, sacral fx, OA, R radus fx, neuropathy, MVP, memory loss, HTN, glaucoma, pancreatic CA, multiple back surgeries, and L TKA.    Clinical Impression  Pt was able to get up OOB and walk back and forth to the bathroom with heavy min assist and RW.  She has been to SNF for rehab in the past and is requesting [facility].  Brace order states "on at all times" and after hours on call MD could not answer for what Dr. Lorin Mercy would want.  Pt reports she slept in her brace after her last surgery, so we kept the brace on when she returned to the bed.  PT to follow acutely for deficits listed below.       Follow Up Recommendations SNF(would like [facility])    Equipment Recommendations  None recommended by PT    Recommendations for Other Services       Precautions / Restrictions Precautions Precautions: Back Precaution Comments: verbally reviewed back precautions and log roll.  Required Braces or Orthoses: Spinal Brace;Other Brace/Splint Spinal Brace: Lumbar corset(order states "on at all times" ) Other Brace/Splint: Pt reports she had to sleep in her brace before, so went with "on at all times to mean in the bed too.   Called MD offic for clarification, but on call MD said it would have to wait until Dr. Lorin Mercy came back in AM.       Mobility  Bed Mobility Overal bed mobility: Needs Assistance Bed Mobility: Sit to Sidelying         Sit to sidelying: Mod assist General bed mobility comments: Mod assist to help bring both legs up into bed.  Verbal cues on reverse log roll technique.    Transfers Overall transfer level:  Needs assistance Equipment used: Rolling walker (2 wheeled) Transfers: Sit to/from Stand Sit to Stand: Min assist         General transfer comment: Min assist to get to standing over flexed knees.  Verbal cues for safe RW use. Pt stood from bed and toilet.   Ambulation/Gait Ambulation/Gait assistance: Min assist Ambulation Distance (Feet): 15 Feet Assistive device: Rolling walker (2 wheeled) Gait Pattern/deviations: Step-through pattern;Shuffle     General Gait Details: Pt with decreased foot clearance bil walking over flexed knees, verbal cues for upright posture and safe RW use.          Balance Overall balance assessment: Needs assistance Sitting-balance support: Feet supported;No upper extremity supported Sitting balance-Leahy Scale: Fair     Standing balance support: Bilateral upper extremity supported Standing balance-Leahy Scale: Poor                               Pertinent Vitals/Pain Pain Assessment: 0-10 Pain Score: 6  Pain Location: incisional Pain Descriptors / Indicators: Burning Pain Intervention(s): Limited activity within patient's tolerance;Monitored during session;Repositioned    Home Living Family/patient expects to be discharged to:: Skilled nursing facility([facility] is her preference)                            Hand Dominance   Dominant Hand:  Right    Extremity/Trunk Assessment   Upper Extremity Assessment Upper Extremity Assessment: Defer to OT evaluation    Lower Extremity Assessment Lower Extremity Assessment: Generalized weakness(no reports of radiating pain, h/o neuropathy per pt )    Cervical / Trunk Assessment Cervical / Trunk Assessment: Other exceptions Cervical / Trunk Exceptions: this is not her first lunmbar surgery.   Communication   Communication: No difficulties  Cognition Arousal/Alertness: Awake/alert Behavior During Therapy: WFL for tasks assessed/performed Overall Cognitive Status:  Within Functional Limits for tasks assessed                                 General Comments: chart reports h/o memory loss             Assessment/Plan    PT Assessment Patient needs continued PT services  PT Problem List Decreased strength;Decreased activity tolerance;Decreased balance;Decreased mobility;Decreased knowledge of use of DME;Decreased knowledge of precautions;Impaired sensation;Pain       PT Treatment Interventions DME instruction;Gait training;Stair training;Functional mobility training;Therapeutic activities;Therapeutic exercise;Balance training;Patient/family education    PT Goals (Current goals can be found in the Care Plan section)  Acute Rehab PT Goals Patient Stated Goal: go to rehab PT Goal Formulation: With patient Time For Goal Achievement: 11/02/17 Potential to Achieve Goals: Good    Frequency Min 5X/week           AM-PAC PT "6 Clicks" Daily Activity  Outcome Measure Difficulty turning over in bed (including adjusting bedclothes, sheets and blankets)?: Unable Difficulty moving from lying on back to sitting on the side of the bed? : Unable Difficulty sitting down on and standing up from a chair with arms (e.g., wheelchair, bedside commode, etc,.)?: Unable Help needed moving to and from a bed to chair (including a wheelchair)?: A Little Help needed walking in hospital room?: A Little Help needed climbing 3-5 steps with a railing? : A Lot 6 Click Score: 11    End of Session Equipment Utilized During Treatment: Back brace Activity Tolerance: Patient limited by pain Patient left: in bed;with call bell/phone within reach Nurse Communication: Mobility status;Other (comment)(called MD to clarify brace order) PT Visit Diagnosis: Muscle weakness (generalized) (M62.81);Difficulty in walking, not elsewhere classified (R26.2);Pain Pain - Right/Left: (lower) Pain - part of body: (back)    Time: 1710-1738 PT Time Calculation (min) (ACUTE  ONLY): 28 min   Charges:         Wells Guiles B. Kahlia Lagunes, PT, DPT [phone]   PT Evaluation $PT Eval Moderate Complexity: 1 Mod PT Treatments $Therapeutic Activity: 8-22 mins   10/26/2017, 6:12 PM

## 2017-10-26 NOTE — Progress Notes (Signed)
Orthopedic Tech Progress Note Patient Details:  [name] 1941-05-16 [id number]  Patient ID: [name], female   DOB: 1941/02/23, 77 y.o.   MRN: [id number]   [name] 10/26/2017, 10:59 AM Called in bio-tech brace order; spoke with Bella Kennedy

## 2017-10-26 NOTE — Progress Notes (Signed)
   Subjective: 1 Day Post-Op Procedure(s) (LRB): POSTERIOR SEGMENTAL HARDWARE REMOVAL, L3-4 RIGHT TRANSFORAMINAL LUMBAR INTERBODY FUSION AND PEDICLE INSTRUMENTATION, CAGE (N/A) Patient reports pain as moderate.    Objective: Vital signs in last 24 hours: Temp:  [97.3 F (36.3 C)-98.2 F (36.8 C)] 97.7 F (36.5 C) (05/30 0500) Pulse Rate:  [66-81] 69 (05/30 0500) Resp:  [11-24] 11 (05/29 2013) BP: (93-161)/(52-67) 116/53 (05/30 0500) SpO2:  [91 %-100 %] 99 % (05/30 0500) Weight:  [150 lb 8 oz (68.3 kg)] 150 lb 8 oz (68.3 kg) (05/29 1059)  Intake/Output from previous day: 05/29 0701 - 05/30 0700 In: 3142.6 [P.O.:240; I.V.:2242.6; IV Piggyback:660] Out: 1075 [Urine:825; Blood:250] Intake/Output this shift: No intake/output data recorded.  Recent Labs    10/26/17 0355  HGB 8.8*   Recent Labs    10/26/17 0355  WBC 8.7  RBC 2.72*  HCT 26.6*  PLT 104*   Recent Labs    10/26/17 0355  NA 141  K 4.6  CL 104  CO2 26  BUN 10  CREATININE 0.67  GLUCOSE 122*  CALCIUM 8.0*   No results for input(s): LABPT, INR in the last 72 hours.  Neurologically intact Dg Lumbar Spine 2-3 Views  Result Date: 10/25/2017 CLINICAL DATA:  77 year old female undergoing lumbar surgery. EXAM: LUMBAR SPINE - 2-3 VIEW; DG C-ARM 61-120 MIN COMPARISON:  Lumbar radiographs 06/20/2017, lumbar spine MRI 08/04/2017. FLUOROSCOPY TIME:  0 minutes 18 seconds FINDINGS: Three intraoperative fluoroscopic spot views of the lumbar spine. Preexisting L4 through S1 posterior and interbody fusion hardware. These images demonstrate addition of L3-L4 posterior and interbody fusion hardware, as well as probable posterior decompression at that level. IMPRESSION: Cephalad extension of lumbar posterior and interbody fusion to L3-L4. Electronically Signed   By: Genevie Ann M.D.   On: 10/25/2017 17:25   Dg C-arm 1-60 Min  Result Date: 10/25/2017 CLINICAL DATA:  77 year old female undergoing lumbar surgery. EXAM: LUMBAR SPINE  - 2-3 VIEW; DG C-ARM 61-120 MIN COMPARISON:  Lumbar radiographs 06/20/2017, lumbar spine MRI 08/04/2017. FLUOROSCOPY TIME:  0 minutes 18 seconds FINDINGS: Three intraoperative fluoroscopic spot views of the lumbar spine. Preexisting L4 through S1 posterior and interbody fusion hardware. These images demonstrate addition of L3-L4 posterior and interbody fusion hardware, as well as probable posterior decompression at that level. IMPRESSION: Cephalad extension of lumbar posterior and interbody fusion to L3-L4. Electronically Signed   By: Genevie Ann M.D.   On: 10/25/2017 17:25   Dg C-arm 1-60 Min  Result Date: 10/25/2017 CLINICAL DATA:  77 year old female undergoing lumbar surgery. EXAM: LUMBAR SPINE - 2-3 VIEW; DG C-ARM 61-120 MIN COMPARISON:  Lumbar radiographs 06/20/2017, lumbar spine MRI 08/04/2017. FLUOROSCOPY TIME:  0 minutes 18 seconds FINDINGS: Three intraoperative fluoroscopic spot views of the lumbar spine. Preexisting L4 through S1 posterior and interbody fusion hardware. These images demonstrate addition of L3-L4 posterior and interbody fusion hardware, as well as probable posterior decompression at that level. IMPRESSION: Cephalad extension of lumbar posterior and interbody fusion to L3-L4. Electronically Signed   By: Genevie Ann M.D.   On: 10/25/2017 17:25    Assessment/Plan: 1 Day Post-Op Procedure(s) (LRB): POSTERIOR SEGMENTAL HARDWARE REMOVAL, L3-4 RIGHT TRANSFORAMINAL LUMBAR INTERBODY FUSION AND PEDICLE INSTRUMENTATION, CAGE (N/A) Up with therapy , will need SNF, need FL2 put in so I can sign.   [name] 10/26/2017, 7:21 AM

## 2017-10-27 MED ORDER — HYDROCODONE-ACETAMINOPHEN 10-325 MG PO TABS: 1.0000 | ORAL_TABLET | Freq: Four times a day (QID) | ORAL | 0 refills | Status: DC | PRN

## 2017-10-27 MED ORDER — BUSPIRONE HCL 10 MG PO TABS
5.0000 mg | ORAL_TABLET | Freq: Two times a day (BID) | ORAL | Status: DC
  Administered 2017-10-27 – 2017-10-28 (×2): 5 mg via ORAL
  Filled 2017-10-27 (×2): qty 1

## 2017-10-27 MED FILL — Sodium Chloride IV Soln 0.9%: INTRAVENOUS | Qty: 1000 | Status: AC

## 2017-10-27 MED FILL — Heparin Sodium (Porcine) Inj 1000 Unit/ML: INTRAMUSCULAR | Qty: 30 | Status: AC

## 2017-10-27 NOTE — Discharge Summary (Signed)
Patient ID: [name] MRN: [id number] DOB/AGE: 11/05/1940 77 y.o.  Admit date: 10/25/2017 Discharge date: 10/27/2017  Admission Diagnoses:  Active Problems:   Lumbar stenosis, followed by Dr. Lorin Mercy, L3-4 disease, severe   Discharge Diagnoses:  Active Problems:   Lumbar stenosis, followed by Dr. Lorin Mercy, L3-4 disease, severe  status post Procedure(s): POSTERIOR SEGMENTAL HARDWARE REMOVAL, L3-4 RIGHT TRANSFORAMINAL LUMBAR INTERBODY FUSION AND PEDICLE INSTRUMENTATION, CAGE  Past Medical History:  Diagnosis Date  . Abnormality of gait 03/27/2014  . Allergic rhinitis   . Ankle fracture, bimalleolar, closed 08/16/2015  . B12 deficiency 08/08/2017  . Cancer ([place]) 07/10/13   Pancreatic cancer with MRI scan 06-19-13  . Chronic maxillary sinusitis    neti pot  . Closed fracture of ramus of right pubis ([place]) 11/22/2016  . Depression    alone a lot  . Eustachian tube dysfunction   . GERD (gastroesophageal reflux disease)   . Glaucoma   . Heart murmur    hx. "MVP" -predental antibiotics  . Hiatal hernia   . Hypertension   . Hypothyroid   . Memory loss    short term memory loss  . Mitral valve prolapse    antibiotics before dental procedures  . Neuropathy   . Open Colles' fracture of right radius 11/02/2016  . Osteoarthritis   . Sacral fracture, closed ([place]) 01/07/2017  . Stroke [facility])    mini storkes left leg paraylsis. patient denies weakness 01/08/14.   . 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 Tardive dyskinesia    possibly reglan, vitamin E helps  . Urgency of urination    some UTI in past    Surgeries: Procedure(s): POSTERIOR SEGMENTAL HARDWARE REMOVAL, L3-4 RIGHT TRANSFORAMINAL LUMBAR INTERBODY FUSION AND PEDICLE INSTRUMENTATION, CAGE on 10/25/2017   Consultants:   Discharged Condition: Improved  Hospital Course: [name] is an 77 y.o. female who was admitted 10/25/2017 for operative treatment of L3-4 stenosis. Patient failed conservative  treatments (please see the history and physical for the specifics) and had severe unremitting pain that affects sleep, daily activities and work/hobbies. After pre-op clearance, the patient was taken to the operating room on 10/25/2017 and underwent  Procedure(s): POSTERIOR SEGMENTAL HARDWARE REMOVAL, L3-4 RIGHT TRANSFORAMINAL LUMBAR INTERBODY FUSION AND PEDICLE INSTRUMENTATION, CAGE.    Patient was given perioperative antibiotics:  Anti-infectives (From admission, onward)   Start     Dose/Rate Route Frequency Ordered Stop   10/26/17 0100  ceFAZolin (ANCEF) IVPB 1 g/50 mL premix     1 g 100 mL/hr over 30 Minutes Intravenous Every 8 hours 10/25/17 2037 10/26/17 1030   10/25/17 1045  ceFAZolin (ANCEF) IVPB 2g/100 mL premix     2 g 200 mL/hr over 30 Minutes Intravenous On call to O.R. 10/25/17 1036 10/25/17 1715   10/25/17 0953  ceFAZolin (ANCEF) 2-4 GM/100ML-% IVPB    Note to Pharmacy:  Debbe Bales, Meredit: cabinet override      10/25/17 0953 10/25/17 1328       Patient was given sequential compression devices and early ambulation to prevent DVT.   Patient benefited maximally from hospital stay and there were no complications. At the time of discharge, the patient was urinating/moving their bowels without difficulty, tolerating a regular diet, pain is controlled with oral pain medications and they have been cleared by PT/OT.   Recent vital signs:  Patient Vitals for the past 24 hrs:  BP Temp Temp src Pulse Resp SpO2  10/27/17 0521 (!) 103/54 (!) 101.6 F (38.7 C) Oral  95 - (!) 81 %  10/26/17 2029 (!) 143/54 98.7 F (37.1 C) Oral 78 - 97 %  10/26/17 1502 (!) 130/107 - - 82 16 92 %     Recent laboratory studies:  Recent Labs    10/26/17 0355  WBC 8.7  HGB 8.8*  HCT 26.6*  PLT 104*  NA 141  K 4.6  CL 104  CO2 26  BUN 10  CREATININE 0.67  GLUCOSE 122*  CALCIUM 8.0*     Discharge Medications:   Allergies as of 10/27/2017      Reactions   Metoclopramide Hcl Other (See  Comments)   Shaking; caused tardive dyskinesia   Other Other (See Comments)   According to patient Trilafor and Reglan cause Tardive Dyskinesia   Niacin Other (See Comments)   shaking   Trovan [alatrofloxacin Mesylate] Other (See Comments)   Caused shaking and nervousness   Benzocaine-resorcinol Rash   Celecoxib Other (See Comments)   unknown   Erythromycin Base Rash   Glucosamine Other (See Comments)   unknown   Nortriptyline Other (See Comments)   Dizziness    Phenazopyridine Hcl Other (See Comments)   unknown   Sulfa Antibiotics Nausea And Vomiting, Rash   Sulfonamide Derivatives Nausea And Vomiting, Rash      Medication List    STOP taking these medications   acetaminophen 325 MG tablet Commonly known as:  TYLENOL   aspirin 81 MG tablet   fluticasone 50 MCG/ACT nasal spray Commonly known as:  FLONASE   HYDROcodone-acetaminophen 5-325 MG tablet Commonly known as:  NORCO/VICODIN Replaced by:  HYDROcodone-acetaminophen 10-325 MG tablet   ibuprofen 200 MG tablet Commonly known as:  ADVIL,MOTRIN   Lysine 500 MG Tabs   omeprazole 20 MG tablet Commonly known as:  PRILOSEC OTC   vitamin E 400 UNIT capsule     TAKE these medications   amLODipine 5 MG tablet Commonly known as:  NORVASC TAKE 1 TABLET BY MOUTH EVERY DAY   atenolol 50 MG tablet Commonly known as:  TENORMIN Take 1 tablet (50 mg total) by mouth 2 (two) times daily.   busPIRone 5 MG tablet Commonly known as:  BUSPAR Take 1 tablet (5 mg total) by mouth 2 (two) times daily.   CALCIUM 600+D 600-800 MG-UNIT Tabs Generic drug:  Calcium Carb-Cholecalciferol Take 1 tablet by mouth daily.   cholecalciferol 1000 units tablet Commonly known as:  VITAMIN D Take 1,000 Units by mouth 2 (two) times daily.   cloNIDine 0.1 mg/24hr patch Commonly known as:  CATAPRES - Dosed in mg/24 hr PLACE 1 PATCH (0.1 MG TOTAL) ONTO THE SKIN ONCE A WEEK.   Coenzyme Q10 300 MG Caps Take 300 mg by mouth daily.    DULoxetine 30 MG capsule Commonly known as:  CYMBALTA Take 1 capsule (30 mg total) by mouth 2 (two) times daily.   hydrochlorothiazide 12.5 MG capsule Commonly known as:  MICROZIDE Take 1 capsule (12.5 mg total) by mouth daily.   HYDROcodone-acetaminophen 10-325 MG tablet Commonly known as:  NORCO Take 1 tablet by mouth every 6 (six) hours as needed for moderate pain ((score 4 to 6)). Replaces:  HYDROcodone-acetaminophen 5-325 MG tablet   latanoprost 0.005 % ophthalmic solution Commonly known as:  XALATAN Place 1 drop into both eyes nightly.   levothyroxine 175 MCG tablet Commonly known as:  SYNTHROID, LEVOTHROID Take 1 tablet (175 mcg total) by mouth daily before breakfast.   CREON 24000-76000 units Cpep Generic drug:  Pancrelipase (Lip-Prot-Amyl) Take 1 capsule by  mouth 2 (two) times daily with a meal.   lipase/protease/amylase 12000 units Cpep capsule Commonly known as:  CREON Take 2 capsules (24,000 Units total) by mouth 3 (three) times daily before meals.   lisinopril 40 MG tablet Commonly known as:  PRINIVIL,ZESTRIL Take 1 tablet (40 mg total) by mouth daily.   loratadine 10 MG tablet Commonly known as:  CLARITIN Take 10 mg by mouth daily.   lovastatin 40 MG tablet Commonly known as:  MEVACOR Take 1 tablet (40 mg total) by mouth at bedtime.   ondansetron 4 MG tablet Commonly known as:  ZOFRAN Take 4 mg by mouth every 8 (eight) hours as needed for nausea or vomiting.   pregabalin 300 MG capsule Commonly known as:  LYRICA Take 1 capsule (300 mg total) by mouth 2 (two) times daily.   PROBIOTIC-10 PO Take 1 tablet by mouth 2 (two) times daily.   psyllium 0.52 g capsule Commonly known as:  REGULOID Take 1.04 g by mouth daily.   REFRESH TEARS 0.5 % Soln Generic drug:  carboxymethylcellulose Place 1 drop into both eyes 2 (two) times daily.   rOPINIRole 0.5 MG tablet Commonly known as:  REQUIP TAKE 1 TABLET BY MOUTH AT BEDTIME   sennosides-docusate  sodium 8.6-50 MG tablet Commonly known as:  SENOKOT-S Take 2 tablets by mouth daily.   simethicone 125 MG chewable tablet Commonly known as:  MYLICON Chew 315 mg by mouth 3 (three) times daily.   tolterodine 4 MG 24 hr capsule Commonly known as:  DETROL LA Take 4 mg by mouth daily.   vitamin C 1000 MG tablet Take 1,000 mg by mouth daily.       Diagnostic Studies: Dg Lumbar Spine 2-3 Views  Result Date: 10/25/2017 CLINICAL DATA:  77 year old female undergoing lumbar surgery. EXAM: LUMBAR SPINE - 2-3 VIEW; DG C-ARM 61-120 MIN COMPARISON:  Lumbar radiographs 06/20/2017, lumbar spine MRI 08/04/2017. FLUOROSCOPY TIME:  0 minutes 18 seconds FINDINGS: Three intraoperative fluoroscopic spot views of the lumbar spine. Preexisting L4 through S1 posterior and interbody fusion hardware. These images demonstrate addition of L3-L4 posterior and interbody fusion hardware, as well as probable posterior decompression at that level. IMPRESSION: Cephalad extension of lumbar posterior and interbody fusion to L3-L4. Electronically Signed   By: Genevie Ann M.D.   On: 10/25/2017 17:25   Dg C-arm 1-60 Min  Result Date: 10/25/2017 CLINICAL DATA:  77 year old female undergoing lumbar surgery. EXAM: LUMBAR SPINE - 2-3 VIEW; DG C-ARM 61-120 MIN COMPARISON:  Lumbar radiographs 06/20/2017, lumbar spine MRI 08/04/2017. FLUOROSCOPY TIME:  0 minutes 18 seconds FINDINGS: Three intraoperative fluoroscopic spot views of the lumbar spine. Preexisting L4 through S1 posterior and interbody fusion hardware. These images demonstrate addition of L3-L4 posterior and interbody fusion hardware, as well as probable posterior decompression at that level. IMPRESSION: Cephalad extension of lumbar posterior and interbody fusion to L3-L4. Electronically Signed   By: Genevie Ann M.D.   On: 10/25/2017 17:25   Dg C-arm 1-60 Min  Result Date: 10/25/2017 CLINICAL DATA:  77 year old female undergoing lumbar surgery. EXAM: LUMBAR SPINE - 2-3 VIEW; DG  C-ARM 61-120 MIN COMPARISON:  Lumbar radiographs 06/20/2017, lumbar spine MRI 08/04/2017. FLUOROSCOPY TIME:  0 minutes 18 seconds FINDINGS: Three intraoperative fluoroscopic spot views of the lumbar spine. Preexisting L4 through S1 posterior and interbody fusion hardware. These images demonstrate addition of L3-L4 posterior and interbody fusion hardware, as well as probable posterior decompression at that level. IMPRESSION: Cephalad extension of lumbar posterior and interbody fusion to  L3-L4. Electronically Signed   By: Genevie Ann M.D.   On: 10/25/2017 17:25      Follow-up Information    [name], MD. Schedule an appointment as soon as possible for a visit.   Specialty:  Orthopedic Surgery Why:  needs return office visit 2 weeks postop Contact information: [place] [address] [phone]           Discharge Plan:  discharge to skilled nursing facility  Disposition:     Signed: Benjiman Core for Rodell Perna MD [facility] [phone] 10/27/2017, 11:27 AM

## 2017-10-27 NOTE — Discharge Instructions (Signed)
ORTHOPEDIC DISCHARGE INSTRUCTIONS  -Brace must be on at all times when up and ambulating  -Okay to shower 5 days postop.  No tub soaking.  Do not apply any creams or ointments to incision.  Daily dressing changes with 4 x 4 gauze and tape.  -Must avoid bending, lifting, twisting.  -RN at facility to do daily wound checks.  If there are any questions regarding the wound our office should be contacted immediately.

## 2017-10-27 NOTE — NC FL2 (Signed)
[place] LEVEL OF CARE SCREENING TOOL     IDENTIFICATION  Patient Name: [name] Birthdate: [birth year] Sex: female Admission Date (Current Location): 10/25/2017  [facility] and [place] Number:  Herbalist and Address:  The . [facility], [place] [address], [place], [place] [address]      Provider Number: [id number]  Attending Physician Name and Address:  [name], MD  Relative Name and Phone Number:  Zaniah [place], son, [phone]    Current Level of Care: Hospital Recommended Level of Care: [place] Prior Approval Number:    Date Approved/Denied:   PASRR Number: [id number]  Discharge Plan: SNF    Current Diagnoses: Patient Active Problem List   Diagnosis Date Noted  . Pre-operative cardiovascular examination 09/27/2017  . History of syncope 09/27/2017  . Palpitations 09/27/2017  . Lumbar stenosis, followed by Dr. 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 Tardive dyskinesia, vitamin E   . History of stroke   . Mitral valve prolapse   . Chronic maxillary sinusitis, using NetiPot, flonase   . 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 Chronic left sacral fracture with mild edema, MRI 07/2017 01/07/2017  . Falls frequently 11/21/2016  . Abnormal radionuclide bone scan 08/16/2015  . HLD (hyperlipidemia), on Lovastatin 07/28/2015  . Fatty liver, on CT 09/09/14 09/09/2014  . Memory loss 01/08/2014  . 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 Pancreatic cancer [facility]), MRI 06/19/13 07/10/2013  . Carcinoma of head of pancreas ([place]) 07/01/2013  . Peripheral neuropathy, Lyrica 400 mg BID 08/30/2012  . 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 Colon polyps 06/05/2008  . Osteoarthritis 06/04/2008  . Chronic diarrhea, intermittent 04/22/2008  . Hypothyroidism, on Levothryoxine 02/26/2007  . Adjustment disorder with mixed anxiety and depressed mood, on Cymbalta and Buspar 02/26/2007  . Glaucoma, using  Refresh tears, followed by [facility] yearly 02/26/2007  . Essential hypertension 02/26/2007  . Allergic rhinitis 02/26/2007  . GERD, on Prilosec 02/26/2007  . Hiatal hernia 02/26/2007  . Osteoporosis, on Calcium and Vitamin D 02/26/2007    Orientation RESPIRATION BLADDER Height & Weight     Self, Time, Situation, Place  Normal Incontinent Weight: 150 lb 8 oz (68.3 kg) Height:  5\' 1"  (154.9 cm)  BEHAVIORAL SYMPTOMS/MOOD NEUROLOGICAL BOWEL NUTRITION STATUS      Continent Diet(See DC Summary)  AMBULATORY STATUS COMMUNICATION OF NEEDS Skin   Limited Assist Verbally Surgical wounds(Lumbar Fusion)                       Personal Care Assistance Level of Assistance  Bathing, Feeding, Dressing Bathing Assistance: Maximum assistance Feeding assistance: Limited assistance Dressing Assistance: Maximum assistance     Functional Limitations Info  Sight, Hearing, Speech Sight Info: Adequate Hearing Info: Adequate Speech Info: Adequate    SPECIAL CARE FACTORS FREQUENCY  PT (By licensed PT), OT (By licensed OT)     PT Frequency: 5x week OT Frequency: 2x week            Contractures Contractures Info: Not present    Additional Factors Info  Code Status, Allergies, Psychotropic Code Status Info: Full Allergies Info: METOCLOPRAMIDE HCL, OTHER, NIACIN, TROVAN ALATROFLOXACIN MESYLATE, BENZOCAINE-RESORCINOL, CELECOXIB, ERYTHROMYCIN BASE, GLUCOSAMINE, NORTRIPTYLINE, PHENAZOPYRIDINE HCL, SULFA ANTIBIOTICS, SULFONAMIDE DERIVATIVES  Psychotropic Info: Cymbalta, Buspar         Current Medications (10/27/2017):  This is the current hospital active medication list Current Facility-Administered Medications  Medication Dose Route Frequency Provider Last Rate Last Dose  .  0.9 %  sodium chloride infusion  250 mL Intravenous Continuous Benjiman Core M, PA-C      . 0.9 %  sodium chloride infusion   Intravenous Continuous Lanae Crumbly, PA-C 85 mL/hr at 10/27/17 0054    . acetaminophen (TYLENOL)  tablet 650 mg  650 mg Oral Q4H PRN Lanae Crumbly, PA-C   650 mg at 10/26/17 8242   Or  . acetaminophen (TYLENOL) suppository 650 mg  650 mg Rectal Q4H PRN Lanae Crumbly, PA-C      . amLODipine (NORVASC) tablet 5 mg  5 mg Oral Daily Benjiman Core M, PA-C   5 mg at 10/26/17 1050  . atenolol (TENORMIN) tablet 50 mg  50 mg Oral BID Lanae Crumbly, PA-C   50 mg at 10/26/17 2156  . busPIRone (BUSPAR) tablet 5 mg  5 mg Oral BID Lanae Crumbly, PA-C   5 mg at 10/27/17 3536  . calcium-vitamin D (OSCAL WITH D) 500-200 MG-UNIT per tablet 1 tablet  1 tablet Oral Q breakfast [name], MD   1 tablet at 10/27/17 0840  . cholecalciferol (VITAMIN D) tablet 1,000 Units  1,000 Units Oral BID Lanae Crumbly, PA-C   1,000 Units at 10/27/17 0840  . cloNIDine (CATAPRES - Dosed in mg/24 hr) patch 0.1 mg  0.1 mg Transdermal Weekly Lanae Crumbly, PA-C   0.1 mg at 10/25/17 2339  . docusate sodium (COLACE) capsule 100 mg  100 mg Oral BID Lanae Crumbly, PA-C   100 mg at 10/27/17 0840  . DULoxetine (CYMBALTA) DR capsule 30 mg  30 mg Oral BID Lanae Crumbly, PA-C   30 mg at 10/27/17 1443  . fesoterodine (TOVIAZ) tablet 8 mg  8 mg Oral Daily Lanae Crumbly, PA-C   8 mg at 10/27/17 1540  . fluticasone (FLONASE) 50 MCG/ACT nasal spray 2 spray  2 spray Each Nare Daily Lanae Crumbly, PA-C   2 spray at 10/27/17 0831  . hydrochlorothiazide (MICROZIDE) capsule 12.5 mg  12.5 mg Oral Daily Lanae Crumbly, PA-C   12.5 mg at 10/27/17 0840  . HYDROcodone-acetaminophen (NORCO) 10-325 MG per tablet 1 tablet  1 tablet Oral Q4H PRN Lanae Crumbly, PA-C   1 tablet at 10/27/17 0867  . lactated ringers infusion   Intravenous Continuous Belinda Block, MD 10 mL/hr at 10/25/17 1124    . latanoprost (XALATAN) 0.005 % ophthalmic solution 1 drop  1 drop Both Eyes QHS Lanae Crumbly, PA-C   1 drop at 10/26/17 2156  . levothyroxine (SYNTHROID, LEVOTHROID) tablet 175 mcg  175 mcg Oral QAC breakfast Lanae Crumbly, PA-C   175 mcg at 10/27/17 6195   . lipase/protease/amylase (CREON) capsule 24,000 Units  24,000 Units Oral BID AC [name], MD   24,000 Units at 10/26/17 1738  . loratadine (CLARITIN) tablet 10 mg  10 mg Oral Daily Lanae Crumbly, PA-C   10 mg at 10/27/17 0840  . menthol-cetylpyridinium (CEPACOL) lozenge 3 mg  1 lozenge Oral PRN Lanae Crumbly, PA-C       Or  . phenol (CHLORASEPTIC) mouth spray 1 spray  1 spray Mouth/Throat PRN Lanae Crumbly, PA-C      . ondansetron [facility]) tablet 4 mg  4 mg Oral Q6H PRN Lanae Crumbly, PA-C       Or  . ondansetron [facility]) injection 4 mg  4 mg Intravenous Q6H PRN Lanae Crumbly, PA-C      .  pantoprazole (PROTONIX) EC tablet 40 mg  40 mg Oral Daily [name], MD   40 mg at 10/27/17 8144  . polyethylene glycol (MIRALAX / GLYCOLAX) packet 17 g  17 g Oral Daily PRN Lanae Crumbly, PA-C      . polyvinyl alcohol (LIQUIFILM TEARS) 1.4 % ophthalmic solution 1 drop  1 drop Both Eyes PRN [name], MD      . pravastatin (PRAVACHOL) tablet 40 mg  40 mg Oral Q2000 Lanae Crumbly, PA-C   40 mg at 10/26/17 2040  . pregabalin (LYRICA) capsule 300 mg  300 mg Oral BID Lanae Crumbly, PA-C   300 mg at 10/27/17 0831  . psyllium (HYDROCIL/METAMUCIL) packet 1 packet  1 packet Oral Daily [name], MD   1 packet at 10/27/17 0831  . rOPINIRole (REQUIP) tablet 0.5 mg  0.5 mg Oral QHS Lanae Crumbly, PA-C   0.5 mg at 10/26/17 2156  . simethicone (MYLICON) chewable tablet 80 mg  80 mg Oral Q8H Benjiman Core M, PA-C   80 mg at 10/27/17 8185  . sodium chloride flush (NS) 0.9 % injection 3 mL  3 mL Intravenous Q12H Lanae Crumbly, PA-C   3 mL at 10/26/17 1000  . sodium chloride flush (NS) 0.9 % injection 3 mL  3 mL Intravenous PRN Lanae Crumbly, PA-C         Discharge Medications: Please see discharge summary for a list of discharge medications.  Relevant Imaging Results:  Relevant Lab Results:   Additional Information SS#: [id number]  [place], LCSW

## 2017-10-27 NOTE — Plan of Care (Signed)
  Problem: Activity: Goal: Risk for activity intolerance will decrease Outcome: Progressing   Problem: Nutrition: Goal: Adequate nutrition will be maintained Outcome: Progressing   

## 2017-10-27 NOTE — Progress Notes (Signed)
Physical Therapy Treatment Patient Details Name: [name] MRN: [id number] DOB: 02-07-1941 Today's Date: 10/27/2017    History of Present Illness 77 y.o. female admitted on 10/25/17 for posterior segmental hardware removal, L3-4 R transforaminal lumbar interbody fusion and pedicle insturmentation.  Pt with significant PMH of urinary urgency, tardive dyskinesia, stroke, sacral fx, OA, R radus fx, neuropathy, MVP, memory loss, HTN, glaucoma, pancreatic CA, multiple back surgeries, and L TKA.      PT Comments    Pt is making slow, yet steady gains.  She was able to walk out of the door to her room today with the RW, but needs physical and verbal cues for safe mobility.  Pt remains appropriate for SNF level rehab at discharge.     Follow Up Recommendations  SNF(would like [facility])     Equipment Recommendations  None recommended by PT    Recommendations for Other Services   NA     Precautions / Restrictions Precautions Precautions: Back Precaution Comments: verbally reviewed back precautions and log roll.  Required Braces or Orthoses: Spinal Brace;Other Brace/Splint Spinal Brace: Lumbar corset;Other (comment)(on at all times per order) Other Brace/Splint: Pt reports she had to sleep in her brace before, so went with "on at all times to mean in the bed too.     Mobility  Bed Mobility               General bed mobility comments: Pt is OOB in the recliner chair.   Transfers Overall transfer level: Needs assistance Equipment used: Rolling walker (2 wheeled) Transfers: Sit to/from Stand Sit to Stand: Min assist         General transfer comment: Min assist to support trunk during transitions.  Verbal cues for safe hand placeement.   Ambulation/Gait Ambulation/Gait assistance: Min assist Ambulation Distance (Feet): 20 Feet Assistive device: Rolling walker (2 wheeled) Gait Pattern/deviations: Step-through pattern;Shuffle     General Gait Details: Pt with flexed  knee posture, shuffling gait, verbal cues to stay close to the RW. Pt very slow to move.           Balance Overall balance assessment: Needs assistance Sitting-balance support: Feet supported;Bilateral upper extremity supported Sitting balance-Leahy Scale: Fair     Standing balance support: Bilateral upper extremity supported Standing balance-Leahy Scale: Poor                              Cognition Arousal/Alertness: Awake/alert Behavior During Therapy: WFL for tasks assessed/performed Overall Cognitive Status: History of cognitive impairments - at baseline                                 General Comments: h/o memory deficits per chart.              Pertinent Vitals/Pain Pain Assessment: 0-10 Pain Score: 8  Pain Location: incisional Pain Descriptors / Indicators: Burning Pain Intervention(s): Limited activity within patient's tolerance;Monitored during session;Repositioned           PT Goals (current goals can now be found in the care plan section) Acute Rehab PT Goals Patient Stated Goal: go to rehab Progress towards PT goals: Progressing toward goals    Frequency    Min 5X/week      PT Plan Current plan remains appropriate       AM-PAC PT "6 Clicks" Daily Activity  Outcome Measure  Difficulty turning  over in bed (including adjusting bedclothes, sheets and blankets)?: Unable Difficulty moving from lying on back to sitting on the side of the bed? : Unable Difficulty sitting down on and standing up from a chair with arms (e.g., wheelchair, bedside commode, etc,.)?: Unable Help needed moving to and from a bed to chair (including a wheelchair)?: A Little Help needed walking in hospital room?: A Little Help needed climbing 3-5 steps with a railing? : A Lot 6 Click Score: 11    End of Session Equipment Utilized During Treatment: Back brace Activity Tolerance: Patient limited by pain Patient left: in chair;with call bell/phone  within reach   PT Visit Diagnosis: Muscle weakness (generalized) (M62.81);Difficulty in walking, not elsewhere classified (R26.2);Pain Pain - Right/Left: (lower) Pain - part of body: (back)     Time: 1025-8527 PT Time Calculation (min) (ACUTE ONLY): 19 min  Charges:  $Gait Training: 8-22 mins          Aijalon Kirtz B. [place], [place], DPT [phone]           10/27/2017, 5:51 PM

## 2017-10-27 NOTE — Clinical Social Work Note (Signed)
Clinical Social Work Assessment  Patient Details  Name: [name] MRN: [id number] Date of Birth: Oct 24, 1940  Date of referral:  10/27/17               Reason for consult:  Facility Placement                Permission sought to share information with:  Chartered certified accountant granted to share information::  Yes, Verbal Permission Granted  Name::     Museum/gallery exhibitions officer::  SNF  Relationship::  son  Contact Information:     Housing/Transportation Living arrangements for the past 2 months:  [place] of Information:  Patient, Adult Children Patient Interpreter Needed:  None Criminal Activity/Legal Involvement Pertinent to Current Situation/Hospitalization:  No - Comment as needed Significant Relationships:    Lives with:  Self Do you feel safe going back to the place where you live?  No Need for family participation in patient care:  Yes (Comment)  Care giving concerns:  Pt from home alone and will need SNF at discharge.  Social Worker assessment / plan:  CSW met with patient and son [name] at bedside to discuss disposition. Pt agreeable to go to SNF and has been to [facility] in the past. Pt indicated that she resides alone and was independent with ADL's prior to hospitalization. CSW explained SNF placement and process. CSW will assist with transport to SNF via [facility].  CSW willf/u for disposition.  Employment status:  Retired Forensic scientist:  Medicare PT Recommendations:  [place] / Referral to community resources:  [place]  Patient/Family's Response to care:  Pt/family thanked CSW for meeting to discuss and was Patent attorney.  Patient/Family's Understanding of and Emotional Response to Diagnosis, Current Treatment, and Prognosis:  Patient/family has good understanding of impairment and agree with recommendations for SNF. Pt appears in appropriate emotional state and was in agreement with  disposition. Once patient has complete rehabilitative therapies, she will return home to her independence. Pt has good family support.  CSW will f/u for disposition.  Emotional Assessment Appearance:    Attitude/Demeanor/Rapport:  (Cooperative) Affect (typically observed):  Accepting, Appropriate Orientation:  Oriented to  Time, Oriented to Situation, Oriented to Place, Oriented to Self Alcohol / Substance use:  Not Applicable Psych involvement (Current and /or in the community):  No (Comment)  Discharge Needs  Concerns to be addressed:  Discharge Planning Concerns Readmission within the last 30 days:  No Current discharge risk:  Dependent with Mobility, Lives alone Barriers to Discharge:  No Barriers Identified   Normajean Baxter, LCSW 10/27/2017, 1:00 PM

## 2017-10-27 NOTE — Social Work (Signed)
CSW discussed SNF offers with patient and patient desires [facility]. CSW confirmed bed offer with SNF and family has accepted.  Pt will discharge to [facility] tomorrow after meeting medicare qualifying stay.  Elissa Hefty, LCSW Clinical Social Worker [phone]

## 2017-10-27 NOTE — Social Work (Signed)
Pt has to meet 3 night qualifying stay, cannot discharge until tomorrow.  CSW will f/u for disposition.  Elissa Hefty, LCSW Clinical Social Worker [phone]

## 2017-10-27 NOTE — Progress Notes (Signed)
   Subjective: 2 Days Post-Op Procedure(s) (LRB): POSTERIOR SEGMENTAL HARDWARE REMOVAL, L3-4 RIGHT TRANSFORAMINAL LUMBAR INTERBODY FUSION AND PEDICLE INSTRUMENTATION, CAGE (N/A) Patient reports pain as moderate.  Some intermittant numbness in her right thigh to knee off and on. Pain better than pre-op in leg.   Objective: Vital signs in last 24 hours: Temp:  [98.7 F (37.1 C)-101.6 F (38.7 C)] 101.6 F (38.7 C) (05/31 0521) Pulse Rate:  [78-95] 95 (05/31 0521) Resp:  [16] 16 (05/30 1502) BP: (103-143)/(54-107) 103/54 (05/31 0521) SpO2:  [81 %-97 %] 81 % (05/31 0521)  Intake/Output from previous day: 05/30 0701 - 05/31 0700 In: 1400 [P.O.:720; I.V.:680] Out: 2700 [Urine:2700] Intake/Output this shift: Total I/O In: 240 [P.O.:240] Out: 450 [Urine:450]  Recent Labs    10/26/17 0355  HGB 8.8*   Recent Labs    10/26/17 0355  WBC 8.7  RBC 2.72*  HCT 26.6*  PLT 104*   Recent Labs    10/26/17 0355  NA 141  K 4.6  CL 104  CO2 26  BUN 10  CREATININE 0.67  GLUCOSE 122*  CALCIUM 8.0*   No results for input(s): LABPT, INR in the last 72 hours.  Neurologically intact No results found.  Assessment/Plan: 2 Days Post-Op Procedure(s) (LRB): POSTERIOR SEGMENTAL HARDWARE REMOVAL, L3-4 RIGHT TRANSFORAMINAL LUMBAR INTERBODY FUSION AND PEDICLE INSTRUMENTATION, CAGE (N/A) Up with therapy Plan SNF when bed available.   [name] 10/27/2017, 10:38 AM

## 2017-10-28 DIAGNOSIS — K219 Gastro-esophageal reflux disease without esophagitis: Secondary | ICD-10-CM | POA: Diagnosis not present

## 2017-10-28 DIAGNOSIS — R2689 Other abnormalities of gait and mobility: Secondary | ICD-10-CM | POA: Diagnosis not present

## 2017-10-28 DIAGNOSIS — S3219XD Other fracture of sacrum, subsequent encounter for fracture with routine healing: Secondary | ICD-10-CM | POA: Diagnosis not present

## 2017-10-28 DIAGNOSIS — K5900 Constipation, unspecified: Secondary | ICD-10-CM | POA: Diagnosis not present

## 2017-10-28 DIAGNOSIS — M549 Dorsalgia, unspecified: Secondary | ICD-10-CM | POA: Diagnosis not present

## 2017-10-28 DIAGNOSIS — Z981 Arthrodesis status: Secondary | ICD-10-CM | POA: Diagnosis not present

## 2017-10-28 DIAGNOSIS — G629 Polyneuropathy, unspecified: Secondary | ICD-10-CM | POA: Diagnosis not present

## 2017-10-28 DIAGNOSIS — R41841 Cognitive communication deficit: Secondary | ICD-10-CM | POA: Diagnosis not present

## 2017-10-28 DIAGNOSIS — R52 Pain, unspecified: Secondary | ICD-10-CM | POA: Diagnosis not present

## 2017-10-28 DIAGNOSIS — M2550 Pain in unspecified joint: Secondary | ICD-10-CM | POA: Diagnosis not present

## 2017-10-28 DIAGNOSIS — M48061 Spinal stenosis, lumbar region without neurogenic claudication: Secondary | ICD-10-CM | POA: Diagnosis not present

## 2017-10-28 DIAGNOSIS — M6281 Muscle weakness (generalized): Secondary | ICD-10-CM | POA: Diagnosis not present

## 2017-10-28 DIAGNOSIS — I959 Hypotension, unspecified: Secondary | ICD-10-CM | POA: Diagnosis not present

## 2017-10-28 DIAGNOSIS — Z4789 Encounter for other orthopedic aftercare: Secondary | ICD-10-CM | POA: Diagnosis not present

## 2017-10-28 DIAGNOSIS — Z7401 Bed confinement status: Secondary | ICD-10-CM | POA: Diagnosis not present

## 2017-10-28 DIAGNOSIS — E039 Hypothyroidism, unspecified: Secondary | ICD-10-CM | POA: Diagnosis not present

## 2017-10-28 DIAGNOSIS — I10 Essential (primary) hypertension: Secondary | ICD-10-CM | POA: Diagnosis not present

## 2017-10-28 NOTE — Progress Notes (Addendum)
Written instructions and prescription will be given to [facility] on arrival. Instructions were reviewed with her son.  Patient is co having gas type pains in her lower abd.  PRN medication was given.  Patient is ready to be released to [place] was given to [facility]

## 2017-10-28 NOTE — Discharge Summary (Signed)
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 female who was admitted 10/25/2017 with a chief complaint of spinal stenosis L3-4, with a final diagnosis of foraminal and central L3-4 stenosis above L4-S1 fusion.  Patient was brought to the operating room on 10/25/2017 and underwent Procedure(s): POSTERIOR SEGMENTAL HARDWARE REMOVAL, L3-4 RIGHT TRANSFORAMINAL LUMBAR INTERBODY FUSION AND PEDICLE INSTRUMENTATION, CAGE.    Patient was given perioperative antibiotics:  Anti-infectives (From admission, onward)   Start     Dose/Rate Route Frequency Ordered Stop   10/26/17 0100  ceFAZolin (ANCEF) IVPB 1 g/50 mL premix     1 g 100 mL/hr over 30 Minutes Intravenous Every 8 hours 10/25/17 2037 10/26/17 1030   10/25/17 1045  ceFAZolin (ANCEF) IVPB 2g/100 mL premix     2 g 200 mL/hr over 30 Minutes Intravenous On call to O.R. 10/25/17 1036 10/25/17 1715   10/25/17 0953  ceFAZolin (ANCEF) 2-4 GM/100ML-% IVPB    Note to Pharmacy:  Debbe Bales, Meredit: cabinet override      10/25/17 0953 10/25/17 1328    .  Patient was given sequential compression devices, early ambulation, and aspirin for DVT prophylaxis.  Recent vital signs:  Patient Vitals for the past 24 hrs:  BP Temp Temp src Pulse Resp SpO2  10/28/17 0411 (!) 148/68 98.3 F (36.8 C) Oral 91 16 100 %  10/27/17 2056 139/65 98.4 F (36.9 C) Oral 98 16 91 %  10/27/17 1308 113/73 - - 94 14 94 %  10/27/17 1305 123/65 - - 94 16 93 %  .  Recent laboratory studies: No results found.  Discharge Medications:   Allergies as of 10/28/2017      Reactions   Metoclopramide Hcl Other (See Comments)   Shaking; caused tardive dyskinesia    Other Other (See Comments)   According to patient Trilafor and Reglan cause Tardive Dyskinesia   Niacin Other (See Comments)   shaking   Trovan [alatrofloxacin Mesylate] Other (See Comments)   Caused shaking and nervousness   Benzocaine-resorcinol Rash   Celecoxib Other (See Comments)   unknown   Erythromycin Base Rash   Glucosamine Other (See Comments)   unknown   Nortriptyline Other (See Comments)   Dizziness    Phenazopyridine Hcl Other (See Comments)   unknown   Sulfa Antibiotics Nausea And Vomiting, Rash   Sulfonamide Derivatives Nausea And Vomiting, Rash      Medication List    STOP taking these medications   acetaminophen 325 MG tablet Commonly known as:  TYLENOL   aspirin 81 MG tablet   fluticasone 50 MCG/ACT nasal spray Commonly known as:  FLONASE   HYDROcodone-acetaminophen 5-325 MG tablet Commonly known as:  NORCO/VICODIN Replaced by:  HYDROcodone-acetaminophen 10-325 MG tablet   ibuprofen 200 MG tablet Commonly known as:  ADVIL,MOTRIN   Lysine 500 MG Tabs   omeprazole 20 MG tablet Commonly known as:  PRILOSEC OTC   vitamin E 400 UNIT capsule     TAKE these medications   amLODipine 5 MG tablet Commonly known as:  NORVASC TAKE 1 TABLET BY MOUTH EVERY DAY   atenolol 50 MG tablet Commonly known as:  TENORMIN Take 1 tablet (50 mg total) by mouth 2 (two) times daily.   busPIRone 5  MG tablet Commonly known as:  BUSPAR Take 1 tablet (5 mg total) by mouth 2 (two) times daily.   CALCIUM 600+D 600-800 MG-UNIT Tabs Generic drug:  Calcium Carb-Cholecalciferol Take 1 tablet by mouth daily.   cholecalciferol 1000 units tablet Commonly known as:  VITAMIN D Take 1,000 Units by mouth 2 (two) times daily.   cloNIDine 0.1 mg/24hr patch Commonly known as:  CATAPRES - Dosed in mg/24 hr PLACE 1 PATCH (0.1 MG TOTAL) ONTO THE SKIN ONCE A WEEK.   Coenzyme Q10 300 MG Caps Take 300 mg by mouth daily.   DULoxetine 30 MG capsule Commonly known as:   CYMBALTA Take 1 capsule (30 mg total) by mouth 2 (two) times daily.   hydrochlorothiazide 12.5 MG capsule Commonly known as:  MICROZIDE Take 1 capsule (12.5 mg total) by mouth daily.   HYDROcodone-acetaminophen 10-325 MG tablet Commonly known as:  NORCO Take 1 tablet by mouth every 6 (six) hours as needed for moderate pain ((score 4 to 6)). Replaces:  HYDROcodone-acetaminophen 5-325 MG tablet   latanoprost 0.005 % ophthalmic solution Commonly known as:  XALATAN Place 1 drop into both eyes nightly.   levothyroxine 175 MCG tablet Commonly known as:  SYNTHROID, LEVOTHROID Take 1 tablet (175 mcg total) by mouth daily before breakfast.   CREON 24000-76000 units Cpep Generic drug:  Pancrelipase (Lip-Prot-Amyl) Take 1 capsule by mouth 2 (two) times daily with a meal.   lipase/protease/amylase 12000 units Cpep capsule Commonly known as:  CREON Take 2 capsules (24,000 Units total) by mouth 3 (three) times daily before meals.   lisinopril 40 MG tablet Commonly known as:  PRINIVIL,ZESTRIL Take 1 tablet (40 mg total) by mouth daily.   loratadine 10 MG tablet Commonly known as:  CLARITIN Take 10 mg by mouth daily.   lovastatin 40 MG tablet Commonly known as:  MEVACOR Take 1 tablet (40 mg total) by mouth at bedtime.   ondansetron 4 MG tablet Commonly known as:  ZOFRAN Take 4 mg by mouth every 8 (eight) hours as needed for nausea or vomiting.   pregabalin 300 MG capsule Commonly known as:  LYRICA Take 1 capsule (300 mg total) by mouth 2 (two) times daily.   PROBIOTIC-10 PO Take 1 tablet by mouth 2 (two) times daily.   psyllium 0.52 g capsule Commonly known as:  REGULOID Take 1.04 g by mouth daily.   REFRESH TEARS 0.5 % Soln Generic drug:  carboxymethylcellulose Place 1 drop into both eyes 2 (two) times daily.   rOPINIRole 0.5 MG tablet Commonly known as:  REQUIP TAKE 1 TABLET BY MOUTH AT BEDTIME   sennosides-docusate sodium 8.6-50 MG tablet Commonly known as:   SENOKOT-S Take 2 tablets by mouth daily.   simethicone 125 MG chewable tablet Commonly known as:  MYLICON Chew 400 mg by mouth 3 (three) times daily.   tolterodine 4 MG 24 hr capsule Commonly known as:  DETROL LA Take 4 mg by mouth daily.   vitamin C 1000 MG tablet Take 1,000 mg by mouth daily.       Diagnostic Studies: Dg Lumbar Spine 2-3 Views  Result Date: 10/25/2017 CLINICAL DATA:  77 year old female undergoing lumbar surgery. EXAM: LUMBAR SPINE - 2-3 VIEW; DG C-ARM 61-120 MIN COMPARISON:  Lumbar radiographs 06/20/2017, lumbar spine MRI 08/04/2017. FLUOROSCOPY TIME:  0 minutes 18 seconds FINDINGS: Three intraoperative fluoroscopic spot views of the lumbar spine. Preexisting L4 through S1 posterior and interbody fusion hardware. These images demonstrate addition of L3-L4 posterior and interbody fusion hardware, as  well as probable posterior decompression at that level. IMPRESSION: Cephalad extension of lumbar posterior and interbody fusion to L3-L4. Electronically Signed   By: Genevie Ann M.D.   On: 10/25/2017 17:25   Dg C-arm 1-60 Min  Result Date: 10/25/2017 CLINICAL DATA:  77 year old female undergoing lumbar surgery. EXAM: LUMBAR SPINE - 2-3 VIEW; DG C-ARM 61-120 MIN COMPARISON:  Lumbar radiographs 06/20/2017, lumbar spine MRI 08/04/2017. FLUOROSCOPY TIME:  0 minutes 18 seconds FINDINGS: Three intraoperative fluoroscopic spot views of the lumbar spine. Preexisting L4 through S1 posterior and interbody fusion hardware. These images demonstrate addition of L3-L4 posterior and interbody fusion hardware, as well as probable posterior decompression at that level. IMPRESSION: Cephalad extension of lumbar posterior and interbody fusion to L3-L4. Electronically Signed   By: Genevie Ann M.D.   On: 10/25/2017 17:25   Dg C-arm 1-60 Min  Result Date: 10/25/2017 CLINICAL DATA:  77 year old female undergoing lumbar surgery. EXAM: LUMBAR SPINE - 2-3 VIEW; DG C-ARM 61-120 MIN COMPARISON:  Lumbar  radiographs 06/20/2017, lumbar spine MRI 08/04/2017. FLUOROSCOPY TIME:  0 minutes 18 seconds FINDINGS: Three intraoperative fluoroscopic spot views of the lumbar spine. Preexisting L4 through S1 posterior and interbody fusion hardware. These images demonstrate addition of L3-L4 posterior and interbody fusion hardware, as well as probable posterior decompression at that level. IMPRESSION: Cephalad extension of lumbar posterior and interbody fusion to L3-L4. Electronically Signed   By: Genevie Ann M.D.   On: 10/25/2017 17:25    Patient benefited maximally from their hospital stay and there were no complications.     Disposition: Discharge disposition: 03-Skilled Nursing Facility      Discharge Instructions    Call MD / Call 911   Complete by:  As directed    If you experience chest pain or shortness of breath, CALL 911 and be transported to the hospital emergency room.  If you develope a fever above 101 F, pus (white drainage) or increased drainage or redness at the wound, or calf pain, call your surgeon's office.   Constipation Prevention   Complete by:  As directed    Drink plenty of fluids.  Prune juice may be helpful.  You may use a stool softener, such as Colace (over the counter) 100 mg twice a day.  Use MiraLax (over the counter) for constipation as needed.   Diet - low sodium heart healthy   Complete by:  As directed    Increase activity slowly as tolerated   Complete by:  As directed       Contact information for follow-up providers    [name], MD. Schedule an appointment as soon as possible for a visit.   Specialty:  Orthopedic Surgery Why:  needs return office visit 2 weeks postop Contact information: [place] [address] [phone]            Contact information for after-discharge care    Destination    [facility] .   Service:  Skilled Nursing Contact information: [place]  [place] [phone]                   Signed: Newt [name] 10/28/2017, 9:10 AM

## 2017-10-28 NOTE — Clinical Social Work Placement (Signed)
   CLINICAL SOCIAL WORK PLACEMENT  NOTE  Date:  10/28/2017  Patient Details  Name: [name] MRN: [id number] Date of Birth: 1941-05-30  Clinical Social Work is seeking post-discharge placement for this patient at the [place] level of care (*CSW will initial, date and re-position this form in  chart as items are completed):  Yes   Patient/family provided with [place] Work Department's list of facilities offering this level of care within the geographic area requested by the patient (or if unable, by the patient's family).  Yes   Patient/family informed of their freedom to choose among providers that offer the needed level of care, that participate in Medicare, Medicaid or managed care program needed by the patient, have an available bed and are willing to accept the patient.  Yes   Patient/family informed of [facility]'s ownership interest in [facility] and [facility], as well as of the fact that they are under no obligation to receive care at these facilities.  PASRR submitted to EDS on       PASRR number received on       Existing PASRR number confirmed on 10/27/17     FL2 transmitted to all facilities in geographic area requested by pt/family on       FL2 transmitted to all facilities within larger geographic area on 10/27/17     Patient informed that his/her managed care company has contracts with or will negotiate with certain facilities, including the following:        Yes   Patient/family informed of bed offers received.  Patient chooses bed at [facility]     Physician recommends and patient chooses bed at      Patient to be transferred to [facility] on  .  Patient to be transferred to facility by [facility]     Patient family notified on 10/28/17 of transfer.  Name of family member notified:  son [name] contacted     PHYSICIAN       Additional Comment:    _______________________________________________ Eileen Stanford, LCSW 10/28/2017, 9:58 AM

## 2017-10-28 NOTE — Progress Notes (Signed)
Physical Therapy Treatment Patient Details Name: [name] MRN: [id number] DOB: 03-27-1941 Today's Date: 10/28/2017    History of Present Illness 77 y.o. female admitted on 10/25/17 for posterior segmental hardware removal, L3-4 R transforaminal lumbar interbody fusion and pedicle insturmentation.  Pt with significant PMH of urinary urgency, tardive dyskinesia, stroke, sacral fx, OA, R radus fx, neuropathy, MVP, memory loss, HTN, glaucoma, pancreatic CA, multiple back surgeries, and L TKA.      PT Comments    Pt able to increase ambulation distance to 50 ft during today's session. Distance limited by fatigue and post op pain. She continues to require mod A for safe technique with bed mobilities to avoid twisting at spine. Provided pt with back handout and reviewed precautions. Expected to d/c to SNF today.     Follow Up Recommendations  SNF(would like [facility])     Equipment Recommendations  None recommended by PT    Recommendations for Other Services       Precautions / Restrictions Precautions Precautions: Back Precaution Comments: Provided back handout. Reviewed precautions and log roll. Required Braces or Orthoses: Spinal Brace;Other Brace/Splint Spinal Brace: Lumbar corset;Other (comment)(on at all times per order) Other Brace/Splint: Pt reports she had to sleep in her brace before, so went with "on at all times to mean in the bed too.  Restrictions Weight Bearing Restrictions: No    Mobility  Bed Mobility Overal bed mobility: Needs Assistance Bed Mobility: Rolling;Sidelying to Sit;Sit to Sidelying Rolling: Min assist Sidelying to sit: Mod assist     Sit to sidelying: Mod assist General bed mobility comments: Pt required multimodal cues to perform log roll. Mod A to power up into sitting and return to side laying.  Transfers Overall transfer level: Needs assistance Equipment used: Rolling walker (2 wheeled) Transfers: Sit to/from Stand Sit to Stand: Min  assist         General transfer comment: min A to rise up into standing. Cues for hand placement with RW.  Ambulation/Gait Ambulation/Gait assistance: Min assist Ambulation Distance (Feet): 50 Feet Assistive device: Rolling walker (2 wheeled) Gait Pattern/deviations: Step-through pattern;Shuffle     General Gait Details: Pt with flexed knee posture, shuffling gait, verbal cues to stay close to the RW. Pt very slow to move.    Stairs             Wheelchair Mobility    Modified Rankin (Stroke Patients Only)       Balance Overall balance assessment: Needs assistance Sitting-balance support: Feet supported;Bilateral upper extremity supported Sitting balance-Leahy Scale: Fair     Standing balance support: Bilateral upper extremity supported Standing balance-Leahy Scale: Poor                              Cognition Arousal/Alertness: Awake/alert Behavior During Therapy: WFL for tasks assessed/performed Overall Cognitive Status: History of cognitive impairments - at baseline                                 General Comments: h/o memory deficits per chart.       Exercises      General Comments General comments (skin integrity, edema, etc.): educated on pillow under knees in supine to provide pain relief and take pressure off back. Son arrived at end of session.      Pertinent Vitals/Pain Pain Assessment: Faces Faces Pain Scale: Hurts even more(intermitant  with movement.) Pain Location: incisional Pain Descriptors / Indicators: Burning Pain Intervention(s): Monitored during session;Limited activity within patient's tolerance;Repositioned    Home Living                      Prior Function            PT Goals (current goals can now be found in the care plan section) Acute Rehab PT Goals Patient Stated Goal: go to rehab PT Goal Formulation: With patient Time For Goal Achievement: 11/02/17 Potential to Achieve Goals:  Good Progress towards PT goals: Progressing toward goals    Frequency    Min 5X/week      PT Plan Current plan remains appropriate    Co-evaluation              AM-PAC PT "6 Clicks" Daily Activity  Outcome Measure  Difficulty turning over in bed (including adjusting bedclothes, sheets and blankets)?: Unable Difficulty moving from lying on back to sitting on the side of the bed? : Unable Difficulty sitting down on and standing up from a chair with arms (e.g., wheelchair, bedside commode, etc,.)?: Unable Help needed moving to and from a bed to chair (including a wheelchair)?: A Little Help needed walking in hospital room?: A Little Help needed climbing 3-5 steps with a railing? : A Lot 6 Click Score: 11    End of Session Equipment Utilized During Treatment: Back brace Activity Tolerance: Patient tolerated treatment well Patient left: with call bell/phone within reach;in bed;with bed alarm set;with family/visitor present Nurse Communication: Mobility status;Other (comment)(called MD to clarify brace order) PT Visit Diagnosis: Muscle weakness (generalized) (M62.81);Difficulty in walking, not elsewhere classified (R26.2);Pain Pain - Right/Left: (lower) Pain - part of body: (back)     Time: 1700-1749 PT Time Calculation (min) (ACUTE ONLY): 19 min  Charges:  $Gait Training: 8-22 mins                    G Codes:       Benjiman Core, [place] Pager [phone] Acute Rehab   Allena Katz 10/28/2017, 10:18 AM

## 2017-10-28 NOTE — Clinical Social Work Note (Addendum)
Clinical Social Worker facilitated patient discharge including contacting patient family and facility to confirm patient discharge plans.  Clinical information faxed to facility and family agreeable with plan.  CSW arranged ambulance transport via [facility] (1:00) to [facility]--pt will go to room 502 A .  RN to call [phone] for report prior to discharge.  Clinical Social Worker will sign off for now as social work intervention is no longer needed. Please consult [place] again if new need arises.  [place], [place]

## 2017-10-30 DIAGNOSIS — M48061 Spinal stenosis, lumbar region without neurogenic claudication: Secondary | ICD-10-CM | POA: Diagnosis not present

## 2017-10-30 DIAGNOSIS — K5900 Constipation, unspecified: Secondary | ICD-10-CM | POA: Diagnosis not present

## 2017-10-30 DIAGNOSIS — R52 Pain, unspecified: Secondary | ICD-10-CM | POA: Diagnosis not present

## 2017-10-30 DIAGNOSIS — I10 Essential (primary) hypertension: Secondary | ICD-10-CM | POA: Diagnosis not present

## 2017-11-01 ENCOUNTER — Encounter (INDEPENDENT_AMBULATORY_CARE_PROVIDER_SITE_OTHER): Payer: Self-pay | Admitting: Orthopaedic Surgery

## 2017-11-01 ENCOUNTER — Ambulatory Visit (INDEPENDENT_AMBULATORY_CARE_PROVIDER_SITE_OTHER): Payer: No Typology Code available for payment source | Admitting: Orthopaedic Surgery

## 2017-11-01 ENCOUNTER — Ambulatory Visit (INDEPENDENT_AMBULATORY_CARE_PROVIDER_SITE_OTHER): Payer: No Typology Code available for payment source

## 2017-11-01 VITALS — BP 84/54 | HR 61 | Ht 61.0 in | Wt 150.0 lb

## 2017-11-01 DIAGNOSIS — Z981 Arthrodesis status: Secondary | ICD-10-CM
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
## 2017-11-02 DIAGNOSIS — I959 Hypotension, unspecified: Secondary | ICD-10-CM | POA: Diagnosis not present

## 2017-11-03 DIAGNOSIS — I10 Essential (primary) hypertension: Secondary | ICD-10-CM | POA: Diagnosis not present

## 2017-11-06 ENCOUNTER — Telehealth (INDEPENDENT_AMBULATORY_CARE_PROVIDER_SITE_OTHER): Payer: Self-pay | Admitting: Orthopaedic Surgery

## 2017-11-06 NOTE — Telephone Encounter (Signed)
I called. He will call SNF and talk with nurse and have MD who is covering SNF to order, she was taking it OTC in the past and wanted it restarted. He will call facility  [facility]

## 2017-11-06 NOTE — Telephone Encounter (Signed)
noted 

## 2017-11-06 NOTE — Telephone Encounter (Signed)
Please advise 

## 2017-11-06 NOTE — Telephone Encounter (Signed)
Patients son called on behalf of his mother wanting to ask for a refill on her Prilosec, wanted to know if it could be sent into [facility]. CB # [phone]

## 2017-11-07 DIAGNOSIS — K219 Gastro-esophageal reflux disease without esophagitis: Secondary | ICD-10-CM | POA: Diagnosis not present

## 2017-11-07 DIAGNOSIS — K5900 Constipation, unspecified: Secondary | ICD-10-CM | POA: Diagnosis not present

## 2017-11-09 ENCOUNTER — Other Ambulatory Visit: Payer: Self-pay | Admitting: Endocrinology

## 2017-11-16 DIAGNOSIS — M549 Dorsalgia, unspecified: Secondary | ICD-10-CM | POA: Diagnosis not present

## 2017-11-16 DIAGNOSIS — G629 Polyneuropathy, unspecified: Secondary | ICD-10-CM | POA: Diagnosis not present

## 2017-11-20 DIAGNOSIS — E039 Hypothyroidism, unspecified: Secondary | ICD-10-CM | POA: Diagnosis not present

## 2017-11-20 DIAGNOSIS — I10 Essential (primary) hypertension: Secondary | ICD-10-CM | POA: Diagnosis not present

## 2017-11-20 DIAGNOSIS — G629 Polyneuropathy, unspecified: Secondary | ICD-10-CM | POA: Diagnosis not present

## 2017-11-20 DIAGNOSIS — M48061 Spinal stenosis, lumbar region without neurogenic claudication: Secondary | ICD-10-CM | POA: Diagnosis not present

## 2017-11-21 ENCOUNTER — Ambulatory Visit: Payer: Medicare Other | Admitting: Adult Health

## 2017-11-27 ENCOUNTER — Encounter: Payer: Self-pay | Admitting: Family Medicine

## 2017-11-27 ENCOUNTER — Ambulatory Visit (INDEPENDENT_AMBULATORY_CARE_PROVIDER_SITE_OTHER): Payer: Medicare Other | Admitting: Family Medicine

## 2017-11-27 ENCOUNTER — Telehealth: Payer: Self-pay | Admitting: Family Medicine

## 2017-11-27 VITALS — BP 132/78 | HR 69 | Temp 98.2°F | Wt 149.4 lb

## 2017-11-27 DIAGNOSIS — R1031 Right lower quadrant pain: Secondary | ICD-10-CM | POA: Diagnosis not present

## 2017-11-27 NOTE — Telephone Encounter (Signed)
Let son know they could take off back bandage if they want since it's been on for 2 weeks. If they have gauze pads they can cover back up or if they feel more comfortable they can wait, but home health needs to get out there this week. Will let them know what we find out.

## 2017-11-27 NOTE — Telephone Encounter (Addendum)
Spoke to [employer].  He was able to get in contact with social worker from [facility].  Apparently home health care has been arranged but not through [facility] in [place] like originally thought.  Pt's son to update me with the status when he finds out who the provider of home care for pt will be.  Also advised pt's son per Dr. Rogers Blocker, can take bandage off of his mom's back if comfortable and recover w/new gauze or leave on if prefer to wait.  Pt's son would like to leave on and wait for home health to evaluate.

## 2017-11-27 NOTE — Progress Notes (Signed)
Patient: [name] MRN: [id number] DOB: 12/27/1940 PCP: [name] Deutscher, DO     Subjective:  Chief Complaint  Patient presents with  . Follow-up    lower back surgery 10/19/17, c/o pain in right groin area radiating to right thigh    HPI: The patient is a 77 y.o. female who presents today for right groin pain that started a few days ago that radiates down her right leg. She doesn't have much back pain at all. Back pain was going on before her surgery. Pain rated as 6-7/10 and described as sharp. Pain is intermittent and seems to happen with bearing down or standing. She can not recall any precipitating event. She took a Norco last night and it did seem to help her some. She denies any weakness in her right leg, loss of sensation. She does have pin and needle feeling down her right leg. She has no urinary incontinence off form her baseline and no loss of sensation around her buttocks. She has had no abdominal pain, diarrhea, blood in her stool, N/V, fever/chills.   She underwent segmental hardware removal, L3-4 right transforaminal lumbar interbody fusion and pedicle instrumentation, cage. This was done on 5/29/22019 by Dr. Lorin Mercy. She was given intraoperative antibiotics and discharged to skilled nursing for rehab. She completed about 2 weeks of rehab.  She had f/u appointment with Dr. Lorin Mercy on 11/01/17 with normal exam and noted incision was healing well. F/u set with him in 6 weeks from this appointment.   She also has some pain under her left right ear that comes and goes. Son believes this is chronic from an injury a year ago. Nothing has changed in this. Pain with neck extension.   Review of Systems  Constitutional: Negative for fatigue and fever.  Respiratory: Negative for shortness of breath.   Cardiovascular: Negative for chest pain.  Gastrointestinal: Negative for abdominal pain, nausea and vomiting.  Genitourinary: Negative for dysuria.  Musculoskeletal: Positive for neck pain.  Negative for back pain.  Skin: Negative.   Neurological: Negative for weakness and numbness.  Psychiatric/Behavioral: The patient is nervous/anxious.     Allergies Patient is allergic to metoclopramide hcl; other; niacin; trovan [alatrofloxacin mesylate]; benzocaine-resorcinol; celecoxib; erythromycin base; glucosamine; nortriptyline; phenazopyridine hcl; sulfa antibiotics; and sulfonamide derivatives.  Past Medical History Patient  has a past medical history of Abnormality of gait (03/27/2014), Allergic rhinitis, Ankle fracture, bimalleolar, closed (08/16/2015), B12 deficiency (08/08/2017), Cancer ([place]) (07/10/13), Chronic maxillary sinusitis, Closed fracture of ramus of right pubis ([place]) (11/22/2016), Depression, Eustachian tube dysfunction, GERD (gastroesophageal reflux disease), Glaucoma, Heart murmur, Hiatal hernia, Hypertension, Hypothyroid, Memory loss, Mitral valve prolapse, Neuropathy, Open Colles' fracture of right radius (11/02/2016), Osteoarthritis, Sacral fracture, closed ([place]) (01/07/2017), Stroke [facility]), Superior mesenteric artery stenosis ([place]) (05/21/2017), Tardive dyskinesia, and Urgency of urination.  Surgical History Patient  has a past surgical history that includes Knee surgery (Left); 1 baker cyst removed; Dilation and curettage of uterus; Lumbar spine surgery; Abdominal hysterectomy; Lumbar spine surgery; Joint replacement; Blepharoplasty (Bilateral); Eye surgery (Right); EUS (N/A, 07/10/2013); Fine needle aspiration (N/A, 07/10/2013); Back surgery; Breast surgery; Colonoscopy w/ polypectomy; laparoscopy (N/A, 08/07/2013); Whipple procedure (N/A, 08/07/2013); Esophagogastroduodenoscopy (N/A, 09/11/2013); ORIF ankle fracture (Right, 08/16/2015); Radioactive seed guided excisional breast biopsy (Left, 12/15/2015); Breast excisional biopsy; ORIF wrist fracture (Right, 11/01/2016); Hardware Removal (Right, 12/15/2016); and Fracture surgery.  Family History Pateint's family history includes  Alzheimer's disease in her father; Cancer in her mother; Other (age of onset: 37) in her brother.  Social History Patient  reports that she has never smoked. She has never used smokeless tobacco. She reports that she does not drink alcohol or use drugs.    Objective: Vitals:   11/27/17 1431  BP: 132/78  Pulse: 69  Temp: 98.2 F (36.8 C)  TempSrc: Oral  SpO2: 98%  Weight: 149 lb 6.4 oz (67.8 kg)    Body mass index is 28.23 kg/m.  Physical Exam  Constitutional: She appears well-developed and well-nourished.  HENT:  Right Ear: External ear normal.  Left Ear: External ear normal.  Mouth/Throat: No oropharyngeal exudate.  Tm pearly with light reflex bilaterally   Neck:  TTP over right SCM at superior attachment. ROM intact, but pain with extension.   Abdominal: Soft. Bowel sounds are normal. There is tenderness (in RLQ. slight bulge on valsalva. ).  Lymphadenopathy:    She has no cervical adenopathy.  Neurological: She displays normal reflexes. No sensory deficit. She exhibits normal muscle tone.  Strength intact in lower legs as well as sensation over top of right leg.   Skin:  Incision healing nicely.   Vitals reviewed.      Assessment/plan: 1. Right groin pain Appears to be a hernia based off exam. Son also thinks this may have been there longer than a few days. I want them to call Dr. Lorin Mercy as well to make sure he doesn't want to see her. Discussed treatment options and that I usually will image vs. Send to surgeon if hernia is very palpable. Biggest concern would be incarceration. We are going to do conservative therapy with heating pad, pain meds and see if this gets better. Also will call Dr. Lorin Mercy and see if he wants to see her sooner to make sure not associated with her back. She is requesting her Norco and discussed she needs to get this through her surgeon.  They also are asking about home health. She was discharged from [facility] and was supposed to have home  health; however, the son states no one has been to their house since that time and her bandage has not been changed. Sounds like she was supposed to have PT as well. [facility] and they do not have patient on their list. [facility] was called and VM was left with Education officer, museum. Need to make sure this is done, otherwise we need to get this done for her. Wound is healing very nicely and doubt she really need a bandage anymore.       Return if symptoms worsen or fail to improve.    Orma Flaming, MD [place]   11/27/2017

## 2017-11-28 ENCOUNTER — Telehealth (INDEPENDENT_AMBULATORY_CARE_PROVIDER_SITE_OTHER): Payer: Self-pay

## 2017-11-28 NOTE — Telephone Encounter (Signed)
Patient's son, [name] called stating that patient saw her PCP on 11/27/2017 and was advised that she has a Hernia on the right lower groin area.  Stated that no x-rays were taken.  Stated that patient has pain when standing, with a pain level of 6-7.  Patient had lumbar fusion on Oct 25, 2017.  Has an appointment scheduled for 12/13/2017, but would like to know if patient needs to come in earlier?  Cb# is [phone].  Please advise.  Thank You.

## 2017-11-28 NOTE — Telephone Encounter (Signed)
Please advise 

## 2017-11-28 NOTE — Telephone Encounter (Signed)
Tell her Dr. Lorin Mercy does not do hernia surgery, needs to see a general surgeon if PCP thinks it is a hernia.

## 2017-11-29 ENCOUNTER — Ambulatory Visit: Payer: Medicare Other | Admitting: Neurology

## 2017-11-29 DIAGNOSIS — G6289 Other specified polyneuropathies: Secondary | ICD-10-CM | POA: Diagnosis not present

## 2017-11-29 DIAGNOSIS — Z79899 Other long term (current) drug therapy: Secondary | ICD-10-CM | POA: Diagnosis not present

## 2017-11-29 DIAGNOSIS — I4891 Unspecified atrial fibrillation: Secondary | ICD-10-CM | POA: Diagnosis not present

## 2017-11-29 DIAGNOSIS — M48061 Spinal stenosis, lumbar region without neurogenic claudication: Secondary | ICD-10-CM | POA: Diagnosis not present

## 2017-11-29 DIAGNOSIS — Z8673 Personal history of transient ischemic attack (TIA), and cerebral infarction without residual deficits: Secondary | ICD-10-CM | POA: Diagnosis not present

## 2017-11-29 DIAGNOSIS — Z742 Need for assistance at home and no other household member able to render care: Secondary | ICD-10-CM | POA: Diagnosis not present

## 2017-11-29 DIAGNOSIS — G2581 Restless legs syndrome: Secondary | ICD-10-CM | POA: Diagnosis not present

## 2017-11-29 DIAGNOSIS — E039 Hypothyroidism, unspecified: Secondary | ICD-10-CM | POA: Diagnosis not present

## 2017-11-29 DIAGNOSIS — Z981 Arthrodesis status: Secondary | ICD-10-CM | POA: Diagnosis not present

## 2017-11-29 DIAGNOSIS — Z96652 Presence of left artificial knee joint: Secondary | ICD-10-CM | POA: Diagnosis not present

## 2017-11-29 DIAGNOSIS — Z4789 Encounter for other orthopedic aftercare: Secondary | ICD-10-CM | POA: Diagnosis not present

## 2017-11-29 DIAGNOSIS — I10 Essential (primary) hypertension: Secondary | ICD-10-CM | POA: Diagnosis not present

## 2017-11-29 DIAGNOSIS — Z8781 Personal history of (healed) traumatic fracture: Secondary | ICD-10-CM | POA: Diagnosis not present

## 2017-11-29 NOTE — Telephone Encounter (Signed)
I called [name] and advised. He states that her back is doing well, the pain is primarily in her right groin from the hernia. PCP advised them to call and let Dr. Lorin Mercy know. They will keep regularly scheduled appt.

## 2017-12-01 DIAGNOSIS — I10 Essential (primary) hypertension: Secondary | ICD-10-CM | POA: Diagnosis not present

## 2017-12-01 DIAGNOSIS — I4891 Unspecified atrial fibrillation: Secondary | ICD-10-CM | POA: Diagnosis not present

## 2017-12-01 DIAGNOSIS — G6289 Other specified polyneuropathies: Secondary | ICD-10-CM | POA: Diagnosis not present

## 2017-12-01 DIAGNOSIS — G2581 Restless legs syndrome: Secondary | ICD-10-CM | POA: Diagnosis not present

## 2017-12-01 DIAGNOSIS — M48061 Spinal stenosis, lumbar region without neurogenic claudication: Secondary | ICD-10-CM | POA: Diagnosis not present

## 2017-12-01 DIAGNOSIS — Z4789 Encounter for other orthopedic aftercare: Secondary | ICD-10-CM | POA: Diagnosis not present

## 2017-12-03 ENCOUNTER — Other Ambulatory Visit: Payer: Self-pay | Admitting: Family Medicine

## 2017-12-04 DIAGNOSIS — I10 Essential (primary) hypertension: Secondary | ICD-10-CM | POA: Diagnosis not present

## 2017-12-04 DIAGNOSIS — M48061 Spinal stenosis, lumbar region without neurogenic claudication: Secondary | ICD-10-CM | POA: Diagnosis not present

## 2017-12-04 DIAGNOSIS — G6289 Other specified polyneuropathies: Secondary | ICD-10-CM | POA: Diagnosis not present

## 2017-12-04 DIAGNOSIS — Z4789 Encounter for other orthopedic aftercare: Secondary | ICD-10-CM | POA: Diagnosis not present

## 2017-12-04 DIAGNOSIS — G2581 Restless legs syndrome: Secondary | ICD-10-CM | POA: Diagnosis not present

## 2017-12-04 DIAGNOSIS — I4891 Unspecified atrial fibrillation: Secondary | ICD-10-CM | POA: Diagnosis not present

## 2017-12-05 ENCOUNTER — Telehealth: Payer: Self-pay | Admitting: *Deleted

## 2017-12-05 NOTE — Telephone Encounter (Signed)
Called and gave verbal ok 

## 2017-12-05 NOTE — Telephone Encounter (Signed)
Copied from [place] [id number]. Topic: General - Other >> Dec 05, 2017  9:26 AM Alfredia Ferguson R wrote: Rande Lawman from [facility] is calling for verbal orders of 3xweek for 1 week and 3xweek for 2 weeks for home health physical therapy

## 2017-12-06 DIAGNOSIS — M48061 Spinal stenosis, lumbar region without neurogenic claudication: Secondary | ICD-10-CM | POA: Diagnosis not present

## 2017-12-06 DIAGNOSIS — Z4789 Encounter for other orthopedic aftercare: Secondary | ICD-10-CM | POA: Diagnosis not present

## 2017-12-06 DIAGNOSIS — I10 Essential (primary) hypertension: Secondary | ICD-10-CM | POA: Diagnosis not present

## 2017-12-06 DIAGNOSIS — G2581 Restless legs syndrome: Secondary | ICD-10-CM | POA: Diagnosis not present

## 2017-12-06 DIAGNOSIS — I4891 Unspecified atrial fibrillation: Secondary | ICD-10-CM | POA: Diagnosis not present

## 2017-12-06 DIAGNOSIS — G6289 Other specified polyneuropathies: Secondary | ICD-10-CM | POA: Diagnosis not present

## 2017-12-07 DIAGNOSIS — G2581 Restless legs syndrome: Secondary | ICD-10-CM | POA: Diagnosis not present

## 2017-12-07 DIAGNOSIS — I4891 Unspecified atrial fibrillation: Secondary | ICD-10-CM | POA: Diagnosis not present

## 2017-12-07 DIAGNOSIS — M48061 Spinal stenosis, lumbar region without neurogenic claudication: Secondary | ICD-10-CM | POA: Diagnosis not present

## 2017-12-07 DIAGNOSIS — G6289 Other specified polyneuropathies: Secondary | ICD-10-CM | POA: Diagnosis not present

## 2017-12-07 DIAGNOSIS — I10 Essential (primary) hypertension: Secondary | ICD-10-CM | POA: Diagnosis not present

## 2017-12-07 DIAGNOSIS — Z4789 Encounter for other orthopedic aftercare: Secondary | ICD-10-CM | POA: Diagnosis not present

## 2017-12-08 ENCOUNTER — Telehealth: Payer: Self-pay

## 2017-12-08 DIAGNOSIS — G2581 Restless legs syndrome: Secondary | ICD-10-CM | POA: Diagnosis not present

## 2017-12-08 DIAGNOSIS — Z4789 Encounter for other orthopedic aftercare: Secondary | ICD-10-CM | POA: Diagnosis not present

## 2017-12-08 DIAGNOSIS — I4891 Unspecified atrial fibrillation: Secondary | ICD-10-CM | POA: Diagnosis not present

## 2017-12-08 DIAGNOSIS — G6289 Other specified polyneuropathies: Secondary | ICD-10-CM | POA: Diagnosis not present

## 2017-12-08 DIAGNOSIS — M48061 Spinal stenosis, lumbar region without neurogenic claudication: Secondary | ICD-10-CM | POA: Diagnosis not present

## 2017-12-08 DIAGNOSIS — I10 Essential (primary) hypertension: Secondary | ICD-10-CM | POA: Diagnosis not present

## 2017-12-08 NOTE — Telephone Encounter (Signed)
Pt is requesting refill on Clonidine patched. She canceled her appt in April and has no existing appt. Refill or Deny?

## 2017-12-08 NOTE — Telephone Encounter (Signed)
She needs to get this from her cardiologist now unless she wants to follow-up with me

## 2017-12-08 NOTE — Telephone Encounter (Signed)
Spoke with patient's son and he stated that her cardiologist is currently managing and prescribing this medication and that he believes it may have been an automatic refill request, because to his knowledge, he has not sent in a refill request for his mother.

## 2017-12-10 ENCOUNTER — Other Ambulatory Visit: Payer: Self-pay | Admitting: Cardiovascular Disease

## 2017-12-11 ENCOUNTER — Other Ambulatory Visit: Payer: Self-pay | Admitting: Family Medicine

## 2017-12-11 DIAGNOSIS — M48061 Spinal stenosis, lumbar region without neurogenic claudication: Secondary | ICD-10-CM | POA: Diagnosis not present

## 2017-12-11 DIAGNOSIS — I10 Essential (primary) hypertension: Secondary | ICD-10-CM | POA: Diagnosis not present

## 2017-12-11 DIAGNOSIS — Z4789 Encounter for other orthopedic aftercare: Secondary | ICD-10-CM | POA: Diagnosis not present

## 2017-12-11 DIAGNOSIS — G2581 Restless legs syndrome: Secondary | ICD-10-CM | POA: Diagnosis not present

## 2017-12-11 DIAGNOSIS — G6289 Other specified polyneuropathies: Secondary | ICD-10-CM | POA: Diagnosis not present

## 2017-12-11 DIAGNOSIS — I4891 Unspecified atrial fibrillation: Secondary | ICD-10-CM | POA: Diagnosis not present

## 2017-12-12 ENCOUNTER — Telehealth: Payer: Self-pay | Admitting: Family Medicine

## 2017-12-12 DIAGNOSIS — I10 Essential (primary) hypertension: Secondary | ICD-10-CM | POA: Diagnosis not present

## 2017-12-12 DIAGNOSIS — G6289 Other specified polyneuropathies: Secondary | ICD-10-CM | POA: Diagnosis not present

## 2017-12-12 DIAGNOSIS — I4891 Unspecified atrial fibrillation: Secondary | ICD-10-CM | POA: Diagnosis not present

## 2017-12-12 DIAGNOSIS — Z4789 Encounter for other orthopedic aftercare: Secondary | ICD-10-CM | POA: Diagnosis not present

## 2017-12-12 DIAGNOSIS — G2581 Restless legs syndrome: Secondary | ICD-10-CM | POA: Diagnosis not present

## 2017-12-12 DIAGNOSIS — M48061 Spinal stenosis, lumbar region without neurogenic claudication: Secondary | ICD-10-CM | POA: Diagnosis not present

## 2017-12-12 NOTE — Telephone Encounter (Signed)
Copied from [place] [id number]. Topic: Quick Communication - See Telephone Encounter >> Dec 12, 2017  9:49 AM Mylinda Latina, NT wrote: CRM for notification. See Telephone encounter for: 12/12/17. Bruce( OT) calling from [facility] is needing verbal orders. The orders are 2x a week for 2 weeks. Please call with the verbals  CB [phone]

## 2017-12-12 NOTE — Telephone Encounter (Signed)
See note

## 2017-12-13 ENCOUNTER — Ambulatory Visit (INDEPENDENT_AMBULATORY_CARE_PROVIDER_SITE_OTHER): Payer: Medicare Other

## 2017-12-13 ENCOUNTER — Ambulatory Visit (INDEPENDENT_AMBULATORY_CARE_PROVIDER_SITE_OTHER): Payer: Medicare Other | Admitting: Orthopaedic Surgery

## 2017-12-13 ENCOUNTER — Encounter (INDEPENDENT_AMBULATORY_CARE_PROVIDER_SITE_OTHER): Payer: Self-pay | Admitting: Orthopaedic Surgery

## 2017-12-13 VITALS — BP 143/68 | HR 66 | Ht 61.0 in | Wt 147.0 lb

## 2017-12-13 DIAGNOSIS — Z981 Arthrodesis status: Secondary | ICD-10-CM | POA: Diagnosis not present
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
## 2017-12-13 NOTE — Telephone Encounter (Signed)
Okay 

## 2017-12-13 NOTE — Telephone Encounter (Signed)
Called and left detailed message with verbal ok

## 2017-12-13 NOTE — Telephone Encounter (Signed)
Ok to give orders  °

## 2017-12-14 ENCOUNTER — Telehealth (INDEPENDENT_AMBULATORY_CARE_PROVIDER_SITE_OTHER): Payer: Self-pay | Admitting: Orthopaedic Surgery

## 2017-12-14 DIAGNOSIS — G6289 Other specified polyneuropathies: Secondary | ICD-10-CM | POA: Diagnosis not present

## 2017-12-14 DIAGNOSIS — M48061 Spinal stenosis, lumbar region without neurogenic claudication: Secondary | ICD-10-CM | POA: Diagnosis not present

## 2017-12-14 DIAGNOSIS — I4891 Unspecified atrial fibrillation: Secondary | ICD-10-CM | POA: Diagnosis not present

## 2017-12-14 DIAGNOSIS — G2581 Restless legs syndrome: Secondary | ICD-10-CM | POA: Diagnosis not present

## 2017-12-14 DIAGNOSIS — I10 Essential (primary) hypertension: Secondary | ICD-10-CM | POA: Diagnosis not present

## 2017-12-14 DIAGNOSIS — Z4789 Encounter for other orthopedic aftercare: Secondary | ICD-10-CM | POA: Diagnosis not present

## 2017-12-14 NOTE — Telephone Encounter (Signed)
Keep walking, it will get better.

## 2017-12-14 NOTE — Telephone Encounter (Signed)
Nurse Estill Bamberg from [facility] called stating Patient feeling sharpe pain down into hip. Couldn't sleep last night at all due to level of pain.  Estill Bamberg [phone]

## 2017-12-14 NOTE — Telephone Encounter (Signed)
Please advise. You just saw patient in office yesterday. They want to know if you have any other suggestions.

## 2017-12-15 ENCOUNTER — Telehealth (INDEPENDENT_AMBULATORY_CARE_PROVIDER_SITE_OTHER): Payer: Self-pay | Admitting: Orthopaedic Surgery

## 2017-12-15 DIAGNOSIS — G2581 Restless legs syndrome: Secondary | ICD-10-CM | POA: Diagnosis not present

## 2017-12-15 DIAGNOSIS — Z4789 Encounter for other orthopedic aftercare: Secondary | ICD-10-CM | POA: Diagnosis not present

## 2017-12-15 DIAGNOSIS — M48061 Spinal stenosis, lumbar region without neurogenic claudication: Secondary | ICD-10-CM | POA: Diagnosis not present

## 2017-12-15 DIAGNOSIS — I10 Essential (primary) hypertension: Secondary | ICD-10-CM | POA: Diagnosis not present

## 2017-12-15 DIAGNOSIS — G6289 Other specified polyneuropathies: Secondary | ICD-10-CM | POA: Diagnosis not present

## 2017-12-15 DIAGNOSIS — I4891 Unspecified atrial fibrillation: Secondary | ICD-10-CM | POA: Diagnosis not present

## 2017-12-15 NOTE — Telephone Encounter (Signed)
IC advised.  

## 2017-12-15 NOTE — Telephone Encounter (Signed)
UCALL. YES THIS IS AS EXPECTED. SHE SHOULD KEEP WALKING. IF THERAPY IS MAKING HER WORSE, STOP THERAPY BUT KEEP WALKING. [place]

## 2017-12-15 NOTE — Telephone Encounter (Signed)
Elizabeth with [facility] is calling to give Dr. Lorin Mercy report about this patient - she is urting more the end of this week than she was the beginning, Tuesday she didn't hurt at all but now is at an8 out of 10 pain scale. Other than that Benjamine Mola states she is doing good and looks straight. Call patient and get more details if needed. Also she said patients back, hips, and both feet neuropathy Glo Herring # [phone]

## 2017-12-15 NOTE — Telephone Encounter (Signed)
Per call from yesterday I had advised per your note to continue walking should get better.

## 2017-12-18 ENCOUNTER — Other Ambulatory Visit: Payer: Self-pay | Admitting: *Deleted

## 2017-12-18 DIAGNOSIS — M48061 Spinal stenosis, lumbar region without neurogenic claudication: Secondary | ICD-10-CM | POA: Diagnosis not present

## 2017-12-18 DIAGNOSIS — G2581 Restless legs syndrome: Secondary | ICD-10-CM | POA: Diagnosis not present

## 2017-12-18 DIAGNOSIS — Z4789 Encounter for other orthopedic aftercare: Secondary | ICD-10-CM | POA: Diagnosis not present

## 2017-12-18 DIAGNOSIS — I4891 Unspecified atrial fibrillation: Secondary | ICD-10-CM | POA: Diagnosis not present

## 2017-12-18 DIAGNOSIS — I10 Essential (primary) hypertension: Secondary | ICD-10-CM | POA: Diagnosis not present

## 2017-12-18 DIAGNOSIS — G6289 Other specified polyneuropathies: Secondary | ICD-10-CM | POA: Diagnosis not present

## 2017-12-18 MED ORDER — ATENOLOL 50 MG PO TABS: 50.0000 mg | ORAL_TABLET | Freq: Two times a day (BID) | ORAL | 6 refills | Status: DC

## 2017-12-20 DIAGNOSIS — G2581 Restless legs syndrome: Secondary | ICD-10-CM | POA: Diagnosis not present

## 2017-12-20 DIAGNOSIS — Z4789 Encounter for other orthopedic aftercare: Secondary | ICD-10-CM | POA: Diagnosis not present

## 2017-12-20 DIAGNOSIS — M48061 Spinal stenosis, lumbar region without neurogenic claudication: Secondary | ICD-10-CM | POA: Diagnosis not present

## 2017-12-20 DIAGNOSIS — G6289 Other specified polyneuropathies: Secondary | ICD-10-CM | POA: Diagnosis not present

## 2017-12-20 DIAGNOSIS — I10 Essential (primary) hypertension: Secondary | ICD-10-CM | POA: Diagnosis not present

## 2017-12-20 DIAGNOSIS — I4891 Unspecified atrial fibrillation: Secondary | ICD-10-CM | POA: Diagnosis not present

## 2017-12-25 ENCOUNTER — Telehealth: Payer: Self-pay | Admitting: Neurology

## 2017-12-25 NOTE — Telephone Encounter (Signed)
[name] @ [facility] [phone] has called for a prescription for Lyrica, she is asking it be faxed to [phone]

## 2017-12-25 NOTE — Telephone Encounter (Signed)
I called [employer] back and spoke with Manuela Schwartz. Advised rx was faxed from our office for Lyrica qty 180, 1 refill on 08/23/17 (90days supply). She verified they received this on 08/24/17. Refill request too soon. She is getting refill today. This is the last refill. They will contact us once it gets closer to her needing a refill.

## 2017-12-27 NOTE — Telephone Encounter (Signed)
error 

## 2017-12-28 ENCOUNTER — Ambulatory Visit (INDEPENDENT_AMBULATORY_CARE_PROVIDER_SITE_OTHER): Payer: Self-pay

## 2017-12-28 ENCOUNTER — Ambulatory Visit (INDEPENDENT_AMBULATORY_CARE_PROVIDER_SITE_OTHER): Payer: Medicare Other | Admitting: Surgery

## 2017-12-28 ENCOUNTER — Encounter (INDEPENDENT_AMBULATORY_CARE_PROVIDER_SITE_OTHER): Payer: Self-pay | Admitting: Surgery

## 2017-12-28 VITALS — BP 131/67 | HR 67 | Ht 61.0 in | Wt 147.0 lb

## 2017-12-28 DIAGNOSIS — M545 Low back pain: Secondary | ICD-10-CM

## 2017-12-28 DIAGNOSIS — M461 Sacroiliitis, not elsewhere classified: Secondary | ICD-10-CM

## 2017-12-28 DIAGNOSIS — M47818 Spondylosis without myelopathy or radiculopathy, sacral and sacrococcygeal region: Secondary | ICD-10-CM

## 2017-12-28 DIAGNOSIS — M7062 Trochanteric bursitis, left hip: Secondary | ICD-10-CM

## 2017-12-28 DIAGNOSIS — G8929 Other chronic pain: Secondary | ICD-10-CM

## 2017-12-28 MED ORDER — KETOROLAC TROMETHAMINE 30 MG/ML IJ SOLN: 30.0000 mg | Freq: Once | INTRAMUSCULAR | Status: AC

## 2017-12-28 MED ORDER — METHYLPREDNISOLONE ACETATE 80 MG/ML IJ SUSP: 80.0000 mg | Freq: Once | INTRAMUSCULAR | Status: DC
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
## 2018-01-01 ENCOUNTER — Telehealth (INDEPENDENT_AMBULATORY_CARE_PROVIDER_SITE_OTHER): Payer: Self-pay | Admitting: Orthopaedic Surgery

## 2018-01-01 NOTE — Telephone Encounter (Signed)
Patient's son called stating that the patient is feeling worse after the injections.  He stated that Dr. Lorin Mercy had discussed with her about possibly referring her to see Dr. Ernestina Patches.  Does the patient need to keep the appointment on 8/15.  CB#[phone].  Thank you.

## 2018-01-01 NOTE — Telephone Encounter (Signed)
Jeneen Rinks last saw patient and 01/11/18 appt scheduled with him for follow up. Will hold for him to address when he is in clinic tomorrow (01/02/18).

## 2018-01-02 DIAGNOSIS — G6289 Other specified polyneuropathies: Secondary | ICD-10-CM | POA: Diagnosis not present

## 2018-01-02 DIAGNOSIS — I10 Essential (primary) hypertension: Secondary | ICD-10-CM | POA: Diagnosis not present

## 2018-01-02 DIAGNOSIS — Z4789 Encounter for other orthopedic aftercare: Secondary | ICD-10-CM | POA: Diagnosis not present

## 2018-01-02 DIAGNOSIS — G2581 Restless legs syndrome: Secondary | ICD-10-CM | POA: Diagnosis not present

## 2018-01-02 DIAGNOSIS — I4891 Unspecified atrial fibrillation: Secondary | ICD-10-CM | POA: Diagnosis not present

## 2018-01-02 DIAGNOSIS — M48061 Spinal stenosis, lumbar region without neurogenic claudication: Secondary | ICD-10-CM | POA: Diagnosis not present

## 2018-01-02 NOTE — Telephone Encounter (Signed)
Please advise 

## 2018-01-03 ENCOUNTER — Telehealth (INDEPENDENT_AMBULATORY_CARE_PROVIDER_SITE_OTHER): Payer: Self-pay | Admitting: Surgery

## 2018-01-03 NOTE — Telephone Encounter (Signed)
Can you please advise on something for pain?

## 2018-01-03 NOTE — Telephone Encounter (Signed)
Son called to check again on referral for Dr. Ernestina Patches since they havent heard anything. He asks that we call patient whenever they are ready to schedule # [phone]

## 2018-01-03 NOTE — Telephone Encounter (Signed)
Patient's husband [name] lmom requesting something for pain for the patient. Please call [name] (husband) or patient [phone]

## 2018-01-04 MED ORDER — HYDROCODONE-ACETAMINOPHEN 5-325 MG PO TABS: ORAL_TABLET | ORAL | 0 refills | Status: DC

## 2018-01-04 NOTE — Telephone Encounter (Signed)
Script at front for pick up.  I left voicemail for patient's son advising.

## 2018-01-04 NOTE — Telephone Encounter (Signed)
Per Jeneen Rinks, hold on Dr. Ernestina Patches. Patient should cancel follow up with him on the 15th and make return office visit with Dr. Lorin Mercy at his next available appt for follow up. I left voicemail for patient's son advising.

## 2018-01-04 NOTE — Addendum Note (Signed)
Addended by: Meyer Cory on: 01/04/2018 04:45 PM   Modules accepted: Orders

## 2018-01-04 NOTE — Telephone Encounter (Signed)
Okay for Norco 5/325 1 tab p.o. every 8-12 hours as needed for pain number 40 tablets no refills

## 2018-01-05 ENCOUNTER — Other Ambulatory Visit: Payer: Self-pay | Admitting: Cardiovascular Disease

## 2018-01-05 NOTE — Telephone Encounter (Signed)
Rx(s) sent to pharmacy electronically.  

## 2018-01-11 ENCOUNTER — Ambulatory Visit (INDEPENDENT_AMBULATORY_CARE_PROVIDER_SITE_OTHER): Payer: Medicare Other | Admitting: Surgery

## 2018-01-16 DIAGNOSIS — Z4789 Encounter for other orthopedic aftercare: Secondary | ICD-10-CM | POA: Diagnosis not present

## 2018-01-16 DIAGNOSIS — G6289 Other specified polyneuropathies: Secondary | ICD-10-CM | POA: Diagnosis not present

## 2018-01-16 DIAGNOSIS — M48061 Spinal stenosis, lumbar region without neurogenic claudication: Secondary | ICD-10-CM | POA: Diagnosis not present

## 2018-01-16 DIAGNOSIS — I10 Essential (primary) hypertension: Secondary | ICD-10-CM | POA: Diagnosis not present

## 2018-01-16 DIAGNOSIS — G2581 Restless legs syndrome: Secondary | ICD-10-CM | POA: Diagnosis not present

## 2018-01-16 DIAGNOSIS — I4891 Unspecified atrial fibrillation: Secondary | ICD-10-CM | POA: Diagnosis not present

## 2018-01-17 ENCOUNTER — Telehealth (INDEPENDENT_AMBULATORY_CARE_PROVIDER_SITE_OTHER): Payer: Self-pay | Admitting: Orthopaedic Surgery

## 2018-01-17 ENCOUNTER — Ambulatory Visit (INDEPENDENT_AMBULATORY_CARE_PROVIDER_SITE_OTHER): Payer: Medicare Other | Admitting: Orthopaedic Surgery

## 2018-01-17 DIAGNOSIS — I10 Essential (primary) hypertension: Secondary | ICD-10-CM | POA: Diagnosis not present

## 2018-01-17 DIAGNOSIS — I4891 Unspecified atrial fibrillation: Secondary | ICD-10-CM | POA: Diagnosis not present

## 2018-01-17 DIAGNOSIS — G6289 Other specified polyneuropathies: Secondary | ICD-10-CM | POA: Diagnosis not present

## 2018-01-17 DIAGNOSIS — Z4789 Encounter for other orthopedic aftercare: Secondary | ICD-10-CM | POA: Diagnosis not present

## 2018-01-17 DIAGNOSIS — E039 Hypothyroidism, unspecified: Secondary | ICD-10-CM | POA: Diagnosis not present

## 2018-01-17 DIAGNOSIS — M48061 Spinal stenosis, lumbar region without neurogenic claudication: Secondary | ICD-10-CM | POA: Diagnosis not present

## 2018-01-17 DIAGNOSIS — G2581 Restless legs syndrome: Secondary | ICD-10-CM | POA: Diagnosis not present

## 2018-01-17 DIAGNOSIS — Z8673 Personal history of transient ischemic attack (TIA), and cerebral infarction without residual deficits: Secondary | ICD-10-CM | POA: Diagnosis not present

## 2018-01-17 NOTE — Telephone Encounter (Signed)
Patients son [name] called to cancel appt for today due to his mom being sick this morning. He will be on vacation all of next week but he said he would really like her to come in Friday if possible. Can something be opened up for her? Call Roger # [phone]

## 2018-01-17 NOTE — Telephone Encounter (Signed)
Could you please work in for appointment at [facility] on Friday? Thanks.

## 2018-01-19 ENCOUNTER — Ambulatory Visit (INDEPENDENT_AMBULATORY_CARE_PROVIDER_SITE_OTHER): Payer: Medicare Other | Admitting: Orthopaedic Surgery

## 2018-01-19 ENCOUNTER — Encounter (INDEPENDENT_AMBULATORY_CARE_PROVIDER_SITE_OTHER): Payer: Self-pay | Admitting: Orthopaedic Surgery

## 2018-01-19 VITALS — BP 135/66 | HR 66 | Ht 61.0 in | Wt 147.0 lb

## 2018-01-19 DIAGNOSIS — Z981 Arthrodesis status: Secondary | ICD-10-CM | POA: Diagnosis not present
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
## 2018-01-30 ENCOUNTER — Ambulatory Visit (INDEPENDENT_AMBULATORY_CARE_PROVIDER_SITE_OTHER): Payer: Medicare Other | Admitting: Orthopaedic Surgery

## 2018-01-30 ENCOUNTER — Other Ambulatory Visit: Payer: Self-pay

## 2018-01-30 MED ORDER — HYDROCHLOROTHIAZIDE 12.5 MG PO CAPS
12.5000 mg | ORAL_CAPSULE | Freq: Every day | ORAL | 2 refills | Status: DC
Start: 2018-01-30 — End: 2018-08-22

## 2018-02-12 ENCOUNTER — Telehealth: Payer: Self-pay | Admitting: *Deleted

## 2018-02-12 MED ORDER — PREGABALIN 300 MG PO CAPS: 300.0000 mg | ORAL_CAPSULE | Freq: Two times a day (BID) | ORAL | 1 refills | Status: DC

## 2018-02-12 NOTE — Telephone Encounter (Signed)
Rx signed, gave to Rance Muir to process for pt.

## 2018-02-12 NOTE — Telephone Encounter (Signed)
I called pt. Advised we received request from [place] pt assistance for refill on Lyrica. Rx will expire 02/14/18. Advised she was last seen 06/2017 and Dr. Jannifer Franklin wanted her to f/u around June. She has not been seen. I scheduled appt for 03/16/18 at 12pm with Dr. Jannifer Franklin. She verbalized understanding. We will refill medication so she does not run out.   Rx printed,waiting on MD signature

## 2018-02-13 NOTE — Telephone Encounter (Signed)
Called patient and she is going to come sign paper work and bring proof of income.

## 2018-02-14 ENCOUNTER — Encounter: Payer: Self-pay | Admitting: Family Medicine

## 2018-02-14 ENCOUNTER — Ambulatory Visit (INDEPENDENT_AMBULATORY_CARE_PROVIDER_SITE_OTHER): Payer: Medicare Other | Admitting: Family Medicine

## 2018-02-14 VITALS — BP 130/64 | HR 86 | Temp 98.6°F | Ht 61.0 in | Wt 148.6 lb

## 2018-02-14 DIAGNOSIS — I10 Essential (primary) hypertension: Secondary | ICD-10-CM

## 2018-02-14 DIAGNOSIS — R5383 Other fatigue: Secondary | ICD-10-CM | POA: Diagnosis not present

## 2018-02-14 DIAGNOSIS — E039 Hypothyroidism, unspecified: Secondary | ICD-10-CM

## 2018-02-14 DIAGNOSIS — D649 Anemia, unspecified: Secondary | ICD-10-CM | POA: Diagnosis not present

## 2018-02-14 DIAGNOSIS — R300 Dysuria: Secondary | ICD-10-CM | POA: Diagnosis not present

## 2018-02-14 LAB — COMPREHENSIVE METABOLIC PANEL
ALT: 27 U/L (ref 0–35)
AST: 20 U/L (ref 0–37)
Albumin: 4 g/dL (ref 3.5–5.2)
Alkaline Phosphatase: 156 U/L — ABNORMAL HIGH (ref 39–117)
BUN: 19 mg/dL (ref 6–23)
CO2: 31 mEq/L (ref 19–32)
Calcium: 9.2 mg/dL (ref 8.4–10.5)
Chloride: 100 mEq/L (ref 96–112)
Creatinine, Ser: 0.67 mg/dL (ref 0.40–1.20)
GFR: 90.37 mL/min (ref >60.00)
Glucose, Bld: 85 mg/dL (ref 70–99)
Potassium: 4.3 mEq/L (ref 3.5–5.1)
Sodium: 137 mEq/L (ref 135–145)
Total Bilirubin: 0.5 mg/dL (ref 0.2–1.2)
Total Protein: 7 g/dL (ref 6.0–8.3)

## 2018-02-14 LAB — CBC WITH DIFFERENTIAL/PLATELET
Basophils Absolute: 0 10*3/uL (ref 0.0–0.1)
Basophils Relative: 0.4 % (ref 0.0–3.0)
Eosinophils Absolute: 0.1 10*3/uL (ref 0.0–0.7)
Eosinophils Relative: 1.8 % (ref 0.0–5.0)
HCT: 37.4 % (ref 36.0–46.0)
Hemoglobin: 12.8 g/dL (ref 12.0–15.0)
Lymphocytes Relative: 16.6 % (ref 12.0–46.0)
Lymphs Abs: 1.4 10*3/uL (ref 0.7–4.0)
MCHC: 34.3 g/dL (ref 30.0–36.0)
MCV: 92.2 fl (ref 78.0–100.0)
Monocytes Absolute: 0.8 10*3/uL (ref 0.1–1.0)
Monocytes Relative: 9.1 % (ref 3.0–12.0)
Neutro Abs: 6 10*3/uL (ref 1.4–7.7)
Neutrophils Relative %: 72.1 % (ref 43.0–77.0)
Platelets: 158 10*3/uL (ref 150.0–400.0)
RBC: 4.06 Mil/uL (ref 3.87–5.11)
RDW: 15.9 % — ABNORMAL HIGH (ref 11.5–15.5)
WBC: 8.3 10*3/uL (ref 4.0–10.5)

## 2018-02-14 MED ORDER — ONDANSETRON HCL 4 MG PO TABS: 4.0000 mg | ORAL_TABLET | Freq: Three times a day (TID) | ORAL | 0 refills | Status: DC | PRN

## 2018-02-14 MED ORDER — CEPHALEXIN 500 MG PO CAPS: 500.0000 mg | ORAL_CAPSULE | Freq: Three times a day (TID) | ORAL | 0 refills | Status: DC

## 2018-02-14 NOTE — Progress Notes (Deleted)
[name] is a 77 y.o. female here for an acute visit.  History of Present Illness:   [name], CMA acting as scribe for Dr. Briscoe Deutscher.    HPI: Patient in office for evaluation for dysuria for two days. She denies any frequency but admits to urgency. She has not had any fever or chills. She states that the pain when she urinates is around a 2-3 /10. Pain goes away after using the restroom. She has had some lower abdominal pain and  has on going back pain that has not changed with new symptoms.    Back pain: she is followed by neurosurgery. She is still having some pain and has started using a walker for stability.  She has noticed a decrease in energy after surgery and some shortness of breath.  Nausea: Patient has had increased nausea for the last few weeks. She ran out of Zofran but we have called in new prescription.     PMHx, SurgHx, SocialHx, Medications, and Allergies were reviewed in the Visit Navigator and updated as appropriate.   Current Medications:   Current Outpatient Medications:  .  amLODipine (NORVASC) 5 MG tablet, TAKE 1 TABLET BY MOUTH EVERY DAY, Disp: 90 tablet, Rfl: 1 .  Ascorbic Acid (VITAMIN C) 1000 MG tablet, Take 1,000 mg by mouth daily., Disp: , Rfl:  .  atenolol (TENORMIN) 50 MG tablet, Take 1 tablet (50 mg total) by mouth 2 (two) times daily., Disp: 60 tablet, Rfl: 6 .  busPIRone (BUSPAR) 5 MG tablet, TAKE 1 TABLET BY MOUTH TWICE DAILY, Disp: 60 tablet, Rfl: 4 .  Calcium Carb-Cholecalciferol (CALCIUM 600+D) 600-800 MG-UNIT TABS, Take 1 tablet by mouth daily., Disp: , Rfl:  .  carboxymethylcellulose (REFRESH TEARS) 0.5 % SOLN, Place 1 drop into both eyes 2 (two) times daily. , Disp: , Rfl:  .  cholecalciferol (VITAMIN D) 1000 units tablet, Take 1,000 Units by mouth 2 (two) times daily., Disp: , Rfl:  .  Coenzyme Q10 300 MG CAPS, Take 300 mg by mouth daily., Disp: , Rfl:  .  DULoxetine (CYMBALTA) 30 MG capsule, Take 1 capsule (30 mg total) by  mouth 2 (two) times daily., Disp: 180 capsule, Rfl: 2 .  hydrochlorothiazide (MICROZIDE) 12.5 MG capsule, Take 1 capsule (12.5 mg total) by mouth daily., Disp: 90 capsule, Rfl: 2 .  latanoprost (XALATAN) 0.005 % ophthalmic solution, Place 1 drop into both eyes nightly., Disp: , Rfl:  .  levothyroxine (SYNTHROID, LEVOTHROID) 175 MCG tablet, Take 1 tablet (175 mcg total) by mouth daily before breakfast., Disp: 90 tablet, Rfl: 3 .  lipase/protease/amylase (CREON) 12000 UNITS CPEP capsule, Take 2 capsules (24,000 Units total) by mouth 3 (three) times daily before meals., Disp: 270 capsule, Rfl: 5 .  lisinopril (PRINIVIL,ZESTRIL) 40 MG tablet, TAKE 1 TABLET BY MOUTH EVERY DAY, Disp: 90 tablet, Rfl: 2 .  loratadine (CLARITIN) 10 MG tablet, Take 10 mg by mouth daily., Disp: , Rfl:  .  lovastatin (MEVACOR) 40 MG tablet, Take 1 tablet (40 mg total) by mouth at bedtime., Disp: 90 tablet, Rfl: 2 .  ondansetron (ZOFRAN) 4 MG tablet, Take 4 mg by mouth every 8 (eight) hours as needed for nausea or vomiting., Disp: , Rfl:  .  Pancrelipase, Lip-Prot-Amyl, (CREON) 24000-76000 units CPEP, Take 1 capsule by mouth 2 (two) times daily with a meal., Disp: , Rfl:  .  pregabalin (LYRICA) 300 MG capsule, Take 1 capsule (300 mg total) by mouth 2 (two) times daily., Disp:  180 capsule, Rfl: 1 .  Probiotic Product (PROBIOTIC-10 PO), Take 1 tablet by mouth 2 (two) times daily., Disp: , Rfl:  .  psyllium (REGULOID) 0.52 g capsule, Take 1.04 g by mouth daily. , Disp: , Rfl:  .  rOPINIRole (REQUIP) 0.5 MG tablet, TAKE 1 TABLET BY MOUTH AT BEDTIME, Disp: 90 tablet, Rfl: 1 .  sennosides-docusate sodium (SENOKOT-S) 8.6-50 MG tablet, Take 2 tablets by mouth daily., Disp: , Rfl:  .  simethicone (MYLICON) 254 MG chewable tablet, Chew 125 mg by mouth 3 (three) times daily., Disp: , Rfl:  .  tolterodine (DETROL LA) 4 MG 24 hr capsule, Take 4 mg by mouth daily. , Disp: , Rfl: 6  Current Facility-Administered Medications:  .   methylPREDNISolone acetate (DEPO-MEDROL) injection 80 mg, 80 mg, Intramuscular, Once, Lanae Crumbly, PA-C   Allergies  Allergen Reactions  . Metoclopramide Hcl Other (See Comments)    Shaking; caused tardive dyskinesia  . Other Other (See Comments)    According to patient Trilafor and Reglan cause Tardive Dyskinesia  . Niacin Other (See Comments)    shaking  . Trovan [Alatrofloxacin Mesylate] Other (See Comments)    Caused shaking and nervousness  . Benzocaine-Resorcinol Rash  . Celecoxib Other (See Comments)    unknown  . Erythromycin Base Rash  . Glucosamine Other (See Comments)    unknown  . Nortriptyline Other (See Comments)    Dizziness   . Phenazopyridine Hcl Other (See Comments)    unknown  . Sulfa Antibiotics Nausea And Vomiting and Rash  . Sulfonamide Derivatives Nausea And Vomiting and Rash   Review of Systems:   Pertinent items are noted in the HPI. Otherwise, ROS is negative.  Vitals:   Vitals:   02/14/18 1047  BP: 130/64  Pulse: 86  Temp: 98.6 F (37 C)  TempSrc: Oral  SpO2: 98%  Weight: 148 lb 9.6 oz (67.4 kg)  Height: 5\' 1"  (1.549 m)     Body mass index is 28.08 kg/m.  Physical Exam:   Physical Exam  Results for orders placed or performed during the hospital encounter of 10/25/17  CBC  Result Value Ref Range   WBC 8.7 4.0 - 10.5 K/uL   RBC 2.72 (L) 3.87 - 5.11 MIL/uL   Hemoglobin 8.8 (L) 12.0 - 15.0 g/dL   HCT 26.6 (L) 36.0 - 46.0 %   MCV 97.8 78.0 - 100.0 fL   MCH 32.4 26.0 - 34.0 pg   MCHC 33.1 30.0 - 36.0 g/dL   RDW 13.7 11.5 - 15.5 %   Platelets 104 (L) 150 - 400 K/uL  Basic Metabolic Panel  Result Value Ref Range   Sodium 141 135 - 145 mmol/L   Potassium 4.6 3.5 - 5.1 mmol/L   Chloride 104 101 - 111 mmol/L   CO2 26 22 - 32 mmol/L   Glucose, Bld 122 (H) 65 - 99 mg/dL   BUN 10 6 - 20 mg/dL   Creatinine, Ser 0.67 0.44 - 1.00 mg/dL   Calcium 8.0 (L) 8.9 - 10.3 mg/dL   GFR calc non Af Amer >60 >60 mL/min   GFR calc Af Amer >60  >60 mL/min   Anion gap 11 5 - 15  Type and screen [place]  Result Value Ref Range   ABO/RH(D) A POS    Antibody Screen NEG    Sample Expiration      10/28/2017 Performed at [facility], [address], [place], [place] [address]  Assessment and Plan:   Krysteena was seen today for dysuria.  Diagnoses and all orders for this visit:  Burning with urination -     POCT urinalysis dipstick; Future -     Urine Culture; Future  Anemia s/p surgery    . Reviewed expectations re: course of current medical issues. . Discussed self-management of symptoms. . Outlined signs and symptoms indicating need for more acute intervention. . Patient verbalized understanding and all questions were answered. Marland Kitchen Health Maintenance issues including appropriate healthy diet, exercise, and smoking avoidance were discussed with patient. . See orders for this visit as documented in the electronic medical record. . Patient received an After Visit Summary.  *** CMA served as Education administrator during this visit. History, Physical, and Plan performed by medical provider. The above documentation has been reviewed and is accurate and complete. Briscoe Deutscher, [place], [place], [facility] 02/14/2018

## 2018-02-14 NOTE — Progress Notes (Signed)
[name] is a 77 y.o. female here for an acute visit.  History of Present Illness:   [name] scribed for [name].  HPI: Dysuria, frequency, urgency. No fever, chills, N/V/D, back pain. No rash.  Patient in office for evaluation for dysuria for two days. She denies any frequency but admits to urgency. She has not had any fever or chills. She states that the pain when she urinates is around a 2-3 /10. Pain goes away after using the restroom. She has had some lower abdominal pain and  has on going back pain that has not changed with new symptoms.    Back pain: she is followed by neurosurgery. She is still having some pain and has started using a walker for stability.  She has noticed a decrease in energy after surgery and some shortness of breath.   Nausea: Patient has had increased nausea for the last few weeks. She ran out of Zofran but we have called in new prescription.   PMHx, SurgHx, SocialHx, Medications, and Allergies were reviewed in the Visit Navigator and updated as appropriate.  Current Medications:   .  amLODipine (NORVASC) 5 MG tablet, TAKE 1 TABLET BY MOUTH EVERY DAY, Disp: 90 tablet, Rfl: 1 .  Ascorbic Acid (VITAMIN C) 1000 MG tablet, Take 1,000 mg by mouth daily., Disp: , Rfl:  .  atenolol (TENORMIN) 50 MG tablet, Take 1 tablet (50 mg total) by mouth 2 (two) times daily., Disp: 60 tablet, Rfl: 6 .  busPIRone (BUSPAR) 5 MG tablet, TAKE 1 TABLET BY MOUTH TWICE DAILY, Disp: 60 tablet, Rfl: 4 .  Calcium Carb-Cholecalciferol (CALCIUM 600+D) 600-800 MG-UNIT TABS, Take 1 tablet by mouth daily., Disp: , Rfl:  .  carboxymethylcellulose (REFRESH TEARS) 0.5 % SOLN, Place 1 drop into both eyes 2 (two) times daily. , Disp: , Rfl:  .  cholecalciferol (VITAMIN D) 1000 units tablet, Take 1,000 Units by mouth 2 (two) times daily., Disp: , Rfl:  .  cloNIDine (CATAPRES - DOSED IN MG/24 HR) 0.1 mg/24hr patch, PLACE 1 PATCH (0.1 MG TOTAL) ONTO THE SKIN ONCE A WEEK., Disp: 4  patch, Rfl: 0 .  Coenzyme Q10 300 MG CAPS, Take 300 mg by mouth daily., Disp: , Rfl:  .  DULoxetine (CYMBALTA) 30 MG capsule, Take 1 capsule (30 mg total) by mouth 2 (two) times daily., Disp: 180 capsule, Rfl: 2 .  hydrochlorothiazide (MICROZIDE) 12.5 MG capsule, Take 1 capsule (12.5 mg total) by mouth daily., Disp: 90 capsule, Rfl: 2 .  latanoprost (XALATAN) 0.005 % ophthalmic solution, Place 1 drop into both eyes nightly., Disp: , Rfl:  .  levothyroxine (SYNTHROID, LEVOTHROID) 175 MCG tablet, Take 1 tablet (175 mcg total) by mouth daily before breakfast., Disp: 90 tablet, Rfl: 3 .  lisinopril (PRINIVIL,ZESTRIL) 40 MG tablet, TAKE 1 TABLET BY MOUTH EVERY DAY, Disp: 90 tablet, Rfl: 2 .  loratadine (CLARITIN) 10 MG tablet, Take 10 mg by mouth daily., Disp: , Rfl:  .  lovastatin (MEVACOR) 40 MG tablet, Take 1 tablet (40 mg total) by mouth at bedtime., Disp: 90 tablet, Rfl: 2 .  ondansetron (ZOFRAN) 4 MG tablet, Take 4 mg by mouth every 8 (eight) hours as needed for nausea or vomiting., Disp: , Rfl:  .  Pancrelipase, Lip-Prot-Amyl, (CREON) 24000-76000 units CPEP, Take 1 capsule by mouth 2 (two) times daily with a meal., Disp: , Rfl:  .  pregabalin (LYRICA) 300 MG capsule, Take 1 capsule (300 mg total) by mouth 2 (two) times  daily., Disp: 180 capsule, Rfl: 1 .  Probiotic Product (PROBIOTIC-10 PO), Take 1 tablet by mouth 2 (two) times daily., Disp: , Rfl:  .  psyllium (REGULOID) 0.52 g capsule, Take 1.04 g by mouth daily. , Disp: , Rfl:  .  rOPINIRole (REQUIP) 0.5 MG tablet, TAKE 1 TABLET BY MOUTH AT BEDTIME, Disp: 90 tablet, Rfl: 1 .  sennosides-docusate sodium (SENOKOT-S) 8.6-50 MG tablet, Take 2 tablets by mouth daily., Disp: , Rfl:  .  simethicone (MYLICON) 295 MG chewable tablet, Chew 125 mg by mouth 3 (three) times daily., Disp: , Rfl:  .  tolterodine (DETROL LA) 4 MG 24 hr capsule, Take 4 mg by mouth daily. , Disp: , Rfl: 6  Allergies  Allergen Reactions  . Metoclopramide Hcl Other (See  Comments)    Shaking; caused tardive dyskinesia  . Other Other (See Comments)    According to patient Trilafor and Reglan cause Tardive Dyskinesia  . Niacin Other (See Comments)    shaking  . Trovan [Alatrofloxacin Mesylate] Other (See Comments)    Caused shaking and nervousness  . Benzocaine-Resorcinol Rash  . Celecoxib Other (See Comments)    unknown  . Erythromycin Base Rash  . Glucosamine Other (See Comments)    unknown  . Nortriptyline Other (See Comments)    Dizziness   . Phenazopyridine Hcl Other (See Comments)    unknown  . Sulfa Antibiotics Nausea And Vomiting and Rash  . Sulfonamide Derivatives Nausea And Vomiting and Rash   Review of Systems:   Pertinent items are noted in the HPI. Otherwise, ROS is negative.  Vitals:   Vitals:   02/14/18 1047  BP: 130/64  Pulse: 86  Temp: 98.6 F (37 C)  TempSrc: Oral  SpO2: 98%  Weight: 148 lb 9.6 oz (67.4 kg)  Height: 5\' 1"  (1.549 m)     Body mass index is 28.08 kg/m.  Physical Exam:   Physical Exam  Constitutional: She appears well-nourished.  HENT:  Head: Normocephalic and atraumatic.  Eyes: Pupils are equal, round, and reactive to light. EOM are normal.  Neck: Normal range of motion. Neck supple.  Cardiovascular: Normal rate, regular rhythm and intact distal pulses.  Pulmonary/Chest: Effort normal.  Abdominal: Soft.  Skin: Skin is warm.  Psychiatric: She has a normal mood and affect. Her behavior is normal.  Nursing note and vitals reviewed.  Assessment and Plan:   Ioanna was seen today for dysuria.  Diagnoses and all orders for this visit:  Burning with urination -     POCT urinalysis dipstick; Future -     Urine Culture; Future -     cephALEXin (KEFLEX) 500 MG capsule; Take 1 capsule (500 mg total) by mouth 3 (three) times daily. -     Urine Culture -     POCT urinalysis dipstick  Anemia s/p surgery -     CBC with Differential/Platelet -     Iron, TIBC and Ferritin Panel  Acquired  hypothyroidism  Essential hypertension -     Comprehensive metabolic panel  . Reviewed expectations re: course of current medical issues. . Discussed self-management of symptoms. . Outlined signs and symptoms indicating need for more acute intervention. . Patient verbalized understanding and all questions were answered. Marland Kitchen Health Maintenance issues including appropriate healthy diet, exercise, and smoking avoidance were discussed with patient. . See orders for this visit as documented in the electronic medical record. . Patient received an After Visit Summary.  CMA served as Education administrator during this visit. History, Physical,  and Plan performed by medical provider. The above documentation has been reviewed and is accurate and complete. Briscoe Deutscher, D.O.  Briscoe Deutscher, DO [place], [facility] 02/20/2018

## 2018-02-15 DIAGNOSIS — R300 Dysuria: Secondary | ICD-10-CM | POA: Diagnosis not present

## 2018-02-15 LAB — IRON,TIBC AND FERRITIN PANEL
%SAT: 15 % (calc) — ABNORMAL LOW (ref 16–45)
Ferritin: 90 ng/mL (ref 16–288)
Iron: 47 ug/dL (ref 45–160)
TIBC: 322 mcg/dL (calc) (ref 250–450)

## 2018-02-15 LAB — POCT URINALYSIS DIPSTICK
Bilirubin, UA: NEGATIVE
Blood, UA: NEGATIVE
Glucose, UA: NEGATIVE
Ketones, UA: NEGATIVE
Nitrite, UA: NEGATIVE
Protein, UA: NEGATIVE
Spec Grav, UA: 1.01 (ref 1.010–1.025)
Urobilinogen, UA: 0.2 E.U./dL
pH, UA: 6 (ref 5.0–8.0)

## 2018-02-17 LAB — URINE CULTURE
MICRO NUMBER:: 91126374
SPECIMEN QUALITY:: ADEQUATE

## 2018-02-19 NOTE — Telephone Encounter (Signed)
Patient came on Friday fax [phone] . PFIZER.  Patient assistance processing.

## 2018-02-20 ENCOUNTER — Encounter: Payer: Self-pay | Admitting: Family Medicine

## 2018-03-14 ENCOUNTER — Ambulatory Visit (INDEPENDENT_AMBULATORY_CARE_PROVIDER_SITE_OTHER): Payer: Medicare Other | Admitting: Orthopaedic Surgery

## 2018-03-16 ENCOUNTER — Encounter: Payer: Self-pay | Admitting: Neurology

## 2018-03-16 ENCOUNTER — Ambulatory Visit (INDEPENDENT_AMBULATORY_CARE_PROVIDER_SITE_OTHER): Payer: Medicare Other | Admitting: Neurology

## 2018-03-16 VITALS — BP 111/66 | HR 73 | Ht 61.0 in | Wt 153.0 lb

## 2018-03-16 DIAGNOSIS — G603 Idiopathic progressive neuropathy: Secondary | ICD-10-CM

## 2018-03-16 MED ORDER — DULOXETINE HCL 60 MG PO CPEP: 60.0000 mg | ORAL_CAPSULE | Freq: Two times a day (BID) | ORAL | 1 refills | Status: DC

## 2018-03-16 MED ORDER — LIDOCAINE-PRILOCAINE 2.5-2.5 % EX CREA: 1.0000 "application " | TOPICAL_CREAM | CUTANEOUS | 3 refills | Status: DC | PRN

## 2018-03-16 MED ORDER — EFINACONAZOLE 10 % EX SOLN: 1.0000 "application " | Freq: Every day | CUTANEOUS | 3 refills | Status: DC
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
## 2018-03-16 NOTE — Patient Instructions (Signed)
With the Cymbalta 30 mg tablets, take one in the morning and 2 in the evening for 2 weeks, then we will convert to 60 mg twice a day.   Cymbalta (duloxetine) is an antidepressant medication that is commonly used for peripheral neuropathy pain or for fibromyalgia pain. As with any antidepressant medication, worsening depression can be seen. This medication can potentially cause headache, dizziness, sexual dysfunction, or nausea. If any problems are noted on this medication, please contact our office.   I will call in EMLA cream for the foot pain.

## 2018-03-20 ENCOUNTER — Telehealth: Payer: Self-pay | Admitting: Family Medicine

## 2018-03-20 ENCOUNTER — Ambulatory Visit (INDEPENDENT_AMBULATORY_CARE_PROVIDER_SITE_OTHER): Payer: Medicare Other | Admitting: Orthopaedic Surgery

## 2018-03-20 ENCOUNTER — Encounter (INDEPENDENT_AMBULATORY_CARE_PROVIDER_SITE_OTHER): Payer: Self-pay | Admitting: Orthopaedic Surgery

## 2018-03-20 ENCOUNTER — Ambulatory Visit (INDEPENDENT_AMBULATORY_CARE_PROVIDER_SITE_OTHER): Payer: Medicare Other

## 2018-03-20 VITALS — BP 112/63 | HR 72 | Ht 61.0 in | Wt 153.0 lb

## 2018-03-20 DIAGNOSIS — Z981 Arthrodesis status: Secondary | ICD-10-CM

## 2018-03-20 NOTE — Telephone Encounter (Signed)
See note  Copied from [place] [id number]. Topic: General - Other >> Mar 20, 2018  2:31 PM Yvette Rack wrote: Reason for CRM: pt son Cyndie Chime [phone] calling stating that Dr Jannifer Franklin gave his mother medicine for toe nail fungus and that it had cost to much and wanted Dr Juleen China to give her something else I advised her son Cyndie Chime to call the Neurologist back but he stated that he want DR Hollace Hayward to send a another RX in for toe nail fungus    [id number] [place], [address] [phone] (Phone) [phone] (Fax)

## 2018-03-20 NOTE — Telephone Encounter (Signed)
Spoke with patients son and let him know that [name] would have to see patient before sending in medication. I told him that he could call Dr. Jannifer Franklin office to see if they would change medication to something different. He verbalized understanding.
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
## 2018-03-27 ENCOUNTER — Encounter: Payer: Self-pay | Admitting: Family Medicine

## 2018-03-27 ENCOUNTER — Ambulatory Visit (INDEPENDENT_AMBULATORY_CARE_PROVIDER_SITE_OTHER): Payer: Medicare Other | Admitting: Family Medicine

## 2018-03-27 VITALS — BP 116/62 | HR 67 | Temp 97.9°F | Ht 61.0 in | Wt 155.2 lb

## 2018-03-27 DIAGNOSIS — B351 Tinea unguium: Secondary | ICD-10-CM

## 2018-03-27 DIAGNOSIS — R682 Dry mouth, unspecified: Secondary | ICD-10-CM

## 2018-03-27 DIAGNOSIS — Z23 Encounter for immunization: Secondary | ICD-10-CM

## 2018-03-27 DIAGNOSIS — J029 Acute pharyngitis, unspecified: Secondary | ICD-10-CM | POA: Diagnosis not present

## 2018-03-27 DIAGNOSIS — R1310 Dysphagia, unspecified: Secondary | ICD-10-CM

## 2018-03-27 LAB — POCT RAPID STREP A (OFFICE): Rapid Strep A Screen: NEGATIVE

## 2018-03-27 MED ORDER — TERBINAFINE HCL 250 MG PO TABS: 250.0000 mg | ORAL_TABLET | Freq: Every day | ORAL | 0 refills | Status: DC

## 2018-03-27 NOTE — Patient Instructions (Addendum)
It was very nice to see you today!  Please start the terbinafine. Come back in 6 weeks to have your liver levels rechecked.  You have a very dry mouth. This is most likely causing your symptoms. Please try taking an artificial saliva product such as biotene or aquaoral. We will also send you to GI to make sure there is nothing else going on.  We will give you your flu shot today.   Take care, Dr [name]

## 2018-03-27 NOTE — Progress Notes (Signed)
   Subjective:  [name] is a 77 y.o. female who presents today for same-day appointment with a chief complaint of sore throat.   HPI:  Sore Throat, Acute problem Started 2 days ago.  Patient states that she was taking her normal medications when she thinks pills became stuck in her esophagus.  She tried drinking water and was eventually able to get it to pass into her stomach.  Since then, she has had some irritation in the back of her throat.  She has been able to tolerate eating and drinking fine.  She has had a little bit of nasal congestion.  Some ear pain.  She has a history of chronic sinus congestion.  She has also had some tongue soreness as well.  No fevers or chills.  Toenail fungus, new problem Several year history.  No specific treatments tried.  Located in bilateral great toes.  ROS: Per HPI  PMH: She reports that she has never smoked. She has never used smokeless tobacco. She reports that she does not drink alcohol or use drugs.  Objective:  Physical Exam: BP 116/62 (BP Location: Left Arm, Patient Position: Sitting, Cuff Size: Normal)   Pulse 67   Temp 97.9 F (36.6 C) (Oral)   Ht 5\' 1"  (1.549 m)   Wt 155 lb 3.2 oz (70.4 kg)   SpO2 96%   BMI 29.32 kg/m   Gen: NAD, resting comfortably HEENT: Very dry appearing mucous membranes.  Oropharynx otherwise normal without lesions.  Nasal mucosa pink and dry appearing bilaterally.  TMs clear bilaterally. CV: RRR with no murmurs appreciated Pulm: NWOB, CTAB with no crackles, wheezes, or rhonchi Skin: Bilateral great toe onychomycosis noted.  Assessment/Plan:  Sore throat/dysphagia/dry mouth No signs of infection.  She has significant xerostomia which I think is largely contributing to her symptoms.  Rapid strep today is negative.  She is on several medications that are contributing to her xerostomia including tolterodine, Requip, Lyrica, HCTZ, and Cymbalta.  Recommended patient to try using artificial saliva to see if  this helps.  She also has a history of a hiatal hernia, which could be contributing to her dysphagia.  Will place referral to GI for further evaluation.  Onychomycosis Start oral terbinafine for 6 weeks.  LFTs from 6 weeks ago were within normal limits.  She will follow-up in 6 weeks for reevaluation.  Will need repeat LFTs at that time.  [name]. [name], MD 03/27/2018 11:14 AM

## 2018-04-05 ENCOUNTER — Ambulatory Visit: Payer: Medicare Other | Admitting: Cardiovascular Disease

## 2018-04-09 ENCOUNTER — Other Ambulatory Visit: Payer: Self-pay

## 2018-04-09 DIAGNOSIS — R1310 Dysphagia, unspecified: Secondary | ICD-10-CM

## 2018-04-18 DIAGNOSIS — Z961 Presence of intraocular lens: Secondary | ICD-10-CM | POA: Diagnosis not present

## 2018-04-18 DIAGNOSIS — H2512 Age-related nuclear cataract, left eye: Secondary | ICD-10-CM | POA: Diagnosis not present

## 2018-04-18 DIAGNOSIS — H401132 Primary open-angle glaucoma, bilateral, moderate stage: Secondary | ICD-10-CM | POA: Insufficient documentation

## 2018-04-18 DIAGNOSIS — H353131 Nonexudative age-related macular degeneration, bilateral, early dry stage: Secondary | ICD-10-CM | POA: Diagnosis not present

## 2018-04-23 ENCOUNTER — Encounter: Payer: Self-pay | Admitting: Physician Assistant

## 2018-04-23 ENCOUNTER — Ambulatory Visit (INDEPENDENT_AMBULATORY_CARE_PROVIDER_SITE_OTHER): Payer: Medicare Other | Admitting: Physician Assistant

## 2018-04-23 ENCOUNTER — Ambulatory Visit: Payer: Medicare Other | Admitting: Family Medicine

## 2018-04-23 VITALS — BP 110/64 | HR 70 | Temp 98.8°F | Ht 61.0 in | Wt 155.4 lb

## 2018-04-23 DIAGNOSIS — L989 Disorder of the skin and subcutaneous tissue, unspecified: Secondary | ICD-10-CM

## 2018-04-23 MED ORDER — TRIAMCINOLONE ACETONIDE 0.5 % EX OINT: 1.0000 "application " | TOPICAL_OINTMENT | Freq: Two times a day (BID) | CUTANEOUS | 0 refills | Status: AC

## 2018-04-23 NOTE — Patient Instructions (Signed)
It was great to see you!  Use the ointment twice daily on your leg.   Let's follow-up in 2 weeks so we can re-evaluate your skin, sooner if you have concerns.  Take care,  Inda Coke PA-C
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
## 2018-04-30 ENCOUNTER — Other Ambulatory Visit: Payer: Self-pay | Admitting: Family Medicine

## 2018-05-14 DIAGNOSIS — R110 Nausea: Secondary | ICD-10-CM | POA: Diagnosis not present

## 2018-05-14 DIAGNOSIS — Z8507 Personal history of malignant neoplasm of pancreas: Secondary | ICD-10-CM | POA: Diagnosis not present

## 2018-05-14 DIAGNOSIS — K219 Gastro-esophageal reflux disease without esophagitis: Secondary | ICD-10-CM | POA: Diagnosis not present

## 2018-05-14 DIAGNOSIS — K8689 Other specified diseases of pancreas: Secondary | ICD-10-CM | POA: Diagnosis not present

## 2018-05-14 DIAGNOSIS — R1310 Dysphagia, unspecified: Secondary | ICD-10-CM | POA: Diagnosis not present

## 2018-05-14 DIAGNOSIS — K580 Irritable bowel syndrome with diarrhea: Secondary | ICD-10-CM | POA: Diagnosis not present

## 2018-05-16 ENCOUNTER — Telehealth: Payer: Self-pay | Admitting: *Deleted

## 2018-05-16 MED ORDER — PREGABALIN 300 MG PO CAPS: 300.0000 mg | ORAL_CAPSULE | Freq: Two times a day (BID) | ORAL | 1 refills | Status: DC

## 2018-05-16 NOTE — Telephone Encounter (Signed)
Patient assistance forms and signed Lyrica prescription submitted via fax (Fax # [phone]) to [employer] Patient Assistance program. Confirmation of fax received and forms filled.  MB RN.

## 2018-05-16 NOTE — Telephone Encounter (Signed)
Pt brought in her part of PAP for lyrica.  Completed prescribers part , prescription printed, all placed in Dr. Tobey Grim inbox for review and for signing of prescription.

## 2018-05-16 NOTE — Telephone Encounter (Signed)
The prescription was signed.

## 2018-05-24 ENCOUNTER — Ambulatory Visit (INDEPENDENT_AMBULATORY_CARE_PROVIDER_SITE_OTHER): Payer: Medicare Other | Admitting: Cardiovascular Disease

## 2018-05-24 ENCOUNTER — Encounter: Payer: Self-pay | Admitting: Cardiovascular Disease

## 2018-05-24 VITALS — BP 110/60 | HR 83 | Ht 61.0 in | Wt 156.8 lb

## 2018-05-24 DIAGNOSIS — R072 Precordial pain: Secondary | ICD-10-CM

## 2018-05-24 DIAGNOSIS — E7800 Pure hypercholesterolemia, unspecified: Secondary | ICD-10-CM

## 2018-05-24 DIAGNOSIS — R0602 Shortness of breath: Secondary | ICD-10-CM | POA: Diagnosis not present

## 2018-05-24 MED ORDER — AMLODIPINE BESYLATE 2.5 MG PO TABS: 2.5000 mg | ORAL_TABLET | Freq: Every day | ORAL | Status: DC

## 2018-05-24 NOTE — Patient Instructions (Addendum)
Medication Instructions:  DECREASE AMLODIPINE TO 2.5 MG ONCE DAILY= 1/2 OF THE 5 MG TABLET ONCE DAILY If you need a refill on your cardiac medications before your next appointment, please call your pharmacy.   Lab work: If you have labs (blood work) drawn today and your tests are completely normal, you will receive your results only by: Marland Kitchen MyChart Message (if you have MyChart) OR . A paper copy in the mail If you have any lab test that is abnormal or we need to change your treatment, we will call you to review the results.  Testing/Procedures: Your physician has requested that you have cardiac CT. Cardiac computed tomography (CT) is a painless test that uses an x-ray machine to take clear, detailed pictures of your heart. For further information please visit [email or web address]. Please follow instruction sheet as given.     Follow-Up: Your physician recommends that you schedule a follow-up appointment in: 2 MONTHS WITH DR [facility]  Please arrive at the [facility] main entrance of [facility] at xx:xx AM (30-45 minutes prior to test start time)  [facility] [place], [place] [address] [phone]  Proceed to the [facility] Radiology Department (First Floor).  Please follow these instructions carefully (unless otherwise directed):  Hold all erectile dysfunction medications at least 48 hours prior to test.  On the Night Before the Test: . Be sure to Drink plenty of water. . Do not consume any caffeinated/decaffeinated beverages or chocolate 12 hours prior to your test. . Do not take any antihistamines 12 hours prior to your test.  On the Day of the Test: . Drink plenty of water. Do not drink any water within one hour of the test. . Do not eat any food 4 hours prior to the test. . You may take your regular medications prior to the test.  . HOLD Furosemide/Hydrochlorothiazide morning of the test.  After the Test: . Drink plenty of  water. . After receiving IV contrast, you may experience a mild flushed feeling. This is normal. . On occasion, you may experience a mild rash up to 24 hours after the test. This is not dangerous. If this occurs, you can take Benadryl 25 mg and increase your fluid intake. . If you experience trouble breathing, this can be serious. If it is severe call 911 IMMEDIATELY. If it is mild, please call our office. . If you take any of these medications: Glipizide/Metformin, Avandament, Glucavance, please do not take 48 hours after completing test.      Mediterranean Diet A Mediterranean diet refers to food and lifestyle choices that are based on the traditions of countries located on the [employer]. This way of eating has been shown to help prevent certain conditions and improve outcomes for people who have chronic diseases, like kidney disease and heart disease. What are tips for following this plan? Lifestyle  Cook and eat meals together with your family, when possible.  Drink enough fluid to keep your urine clear or pale yellow.  Be physically active every day. This includes: ? Aerobic exercise like running or swimming. ? Leisure activities like gardening, walking, or housework.  Get 7-8 hours of sleep each night.  If recommended by your health care provider, drink red wine in moderation. This means 1 glass a day for nonpregnant women and 2 glasses a day for men. A glass of wine equals 5 oz (150 mL). Reading food labels   Check the serving size of packaged foods. For  foods such as rice and pasta, the serving size refers to the amount of cooked product, not dry.  Check the total fat in packaged foods. Avoid foods that have saturated fat or trans fats.  Check the ingredients list for added sugars, such as corn syrup. Shopping  At the grocery store, buy most of your food from the areas near the walls of the store. This includes: ? Fresh fruits and vegetables (produce). ? Grains,  beans, nuts, and seeds. Some of these may be available in unpackaged forms or large amounts (in bulk). ? Fresh seafood. ? Poultry and eggs. ? Low-fat dairy products.  Buy whole ingredients instead of prepackaged foods.  Buy fresh fruits and vegetables in-season from local farmers markets.  Buy frozen fruits and vegetables in resealable bags.  If you do not have access to quality fresh seafood, buy precooked frozen shrimp or canned fish, such as tuna, salmon, or sardines.  Buy small amounts of raw or cooked vegetables, salads, or olives from the deli or salad bar at your store.  Stock your pantry so you always have certain foods on hand, such as olive oil, canned tuna, canned tomatoes, rice, pasta, and beans. Cooking  Cook foods with extra-virgin olive oil instead of using butter or other vegetable oils.  Have meat as a side dish, and have vegetables or grains as your main dish. This means having meat in small portions or adding small amounts of meat to foods like pasta or stew.  Use beans or vegetables instead of meat in common dishes like chili or lasagna.  Experiment with different cooking methods. Try roasting or broiling vegetables instead of steaming or sauteing them.  Add frozen vegetables to soups, stews, pasta, or rice.  Add nuts or seeds for added healthy fat at each meal. You can add these to yogurt, salads, or vegetable dishes.  Marinate fish or vegetables using olive oil, lemon juice, garlic, and fresh herbs. Meal planning   Plan to eat 1 vegetarian meal one day each week. Try to work up to 2 vegetarian meals, if possible.  Eat seafood 2 or more times a week.  Have healthy snacks readily available, such as: ? Vegetable sticks with hummus. ? [place] yogurt. ? Fruit and nut trail mix.  Eat balanced meals throughout the week. This includes: ? Fruit: 2-3 servings a day ? Vegetables: 4-5 servings a day ? Low-fat dairy: 2 servings a day ? Fish, poultry, or lean  meat: 1 serving a day ? Beans and legumes: 2 or more servings a week ? Nuts and seeds: 1-2 servings a day ? Whole grains: 6-8 servings a day ? Extra-virgin olive oil: 3-4 servings a day  Limit red meat and sweets to only a few servings a month What are my food choices?  Mediterranean diet ? Recommended ? Grains: Whole-grain pasta. Brown rice. Bulgar wheat. Polenta. Couscous. Whole-wheat bread. [place]. ? Vegetables: Artichokes. Beets. Broccoli. Cabbage. Carrots. Eggplant. Green beans. Chard. Kale. Spinach. Onions. Leeks. Peas. Squash. Tomatoes. Peppers. Radishes. ? Fruits: Apples. Apricots. Avocado. Berries. Bananas. Cherries. Dates. Figs. Grapes. Lemons. Melon. Oranges. Peaches. Plums. Pomegranate. ? Meats and other protein foods: Beans. Almonds. Sunflower seeds. Pine nuts. Peanuts. [place]. Salmon. Scallops. Shrimp. [place]. Tilapia. Clams. Oysters. Eggs. ? Dairy: Low-fat milk. Cheese. Greek yogurt. ? Beverages: Water. Red wine. Herbal tea. ? Fats and oils: Extra virgin olive oil. Avocado oil. Grape seed oil. ? Sweets and desserts: [place] yogurt with honey. Baked apples. Poached pears. Trail mix. ? Seasoning and  other foods: Basil. Cilantro. Coriander. Cumin. Mint. Parsley. Sage. Rosemary. Tarragon. Garlic. Oregano. Thyme. Pepper. Balsalmic vinegar. Tahini. Hummus. Tomato sauce. Olives. Mushrooms. ? Limit these ? Grains: Prepackaged pasta or rice dishes. Prepackaged cereal with added sugar. ? Vegetables: Deep fried potatoes (french fries). ? Fruits: Fruit canned in syrup. ? Meats and other protein foods: Beef. Pork. Lamb. Poultry with skin. Hot dogs. Berniece Salines. ? Dairy: Ice cream. Sour cream. Whole milk. ? Beverages: Juice. Sugar-sweetened soft drinks. Beer. Liquor and spirits. ? Fats and oils: Butter. Canola oil. Vegetable oil. Beef fat (tallow). Lard. ? Sweets and desserts: Cookies. Cakes. Pies. Candy. ? Seasoning and other foods: Mayonnaise. Premade sauces and marinades. ? The items  listed may not be a complete list. Talk with your dietitian about what dietary choices are right for you. Summary  The Mediterranean diet includes both food and lifestyle choices.  Eat a variety of fresh fruits and vegetables, beans, nuts, seeds, and whole grains.  Limit the amount of red meat and sweets that you eat.  Talk with your health care provider about whether it is safe for you to drink red wine in moderation. This means 1 glass a day for nonpregnant women and 2 glasses a day for men. A glass of wine equals 5 oz (150 mL). This information is not intended to replace advice given to you by your health care provider. Make sure you discuss any questions you have with your health care provider. Document Released: 01/07/2016 Document Revised: 02/09/2016 Document Reviewed: 01/07/2016 Elsevier Interactive Patient Education  2019 [employer].
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
## 2018-05-31 ENCOUNTER — Inpatient Hospital Stay: Payer: Medicare Other | Attending: Oncology | Admitting: Oncology

## 2018-05-31 ENCOUNTER — Telehealth: Payer: Self-pay

## 2018-05-31 VITALS — BP 147/78 | HR 77 | Temp 98.3°F | Resp 18 | Ht 61.0 in | Wt 156.6 lb

## 2018-05-31 DIAGNOSIS — D696 Thrombocytopenia, unspecified: Secondary | ICD-10-CM | POA: Insufficient documentation

## 2018-05-31 DIAGNOSIS — R110 Nausea: Secondary | ICD-10-CM | POA: Insufficient documentation

## 2018-05-31 DIAGNOSIS — C250 Malignant neoplasm of head of pancreas: Secondary | ICD-10-CM

## 2018-05-31 DIAGNOSIS — Z8507 Personal history of malignant neoplasm of pancreas: Secondary | ICD-10-CM | POA: Insufficient documentation

## 2018-05-31 NOTE — Progress Notes (Signed)
  [place] [facility] OFFICE PROGRESS NOTE   Diagnosis: Pancreas cancer  INTERVAL HISTORY:   [name] returns as scheduled.  She reports a good appetite.  No dyspnea or pain.  She has a sore throat today.  No diarrhea.  She is concerned about gaining weight and would like a dietitian appointment.  She walks several days per week.  Objective:  Vital signs in last 24 hours:  Blood pressure (!) 147/78, pulse 77, temperature 98.3 F (36.8 C), temperature source Oral, resp. rate 18, height 5\' 1"  (1.549 m), weight 156 lb 9.6 oz (71 kg), SpO2 99 %.    HEENT: No thrush, pharynx without erythema, small amount of mucoid appearing drainage Lymphatics: No cervical, supraclavicular, axillary, or inguinal nodes Resp: Lungs clear bilaterally Cardio: Regular rate and rhythm GI: No hepatosplenomegaly, no mass, mild diffuse tenderness Vascular: No leg edema   Lab Results:  Lab Results  Component Value Date   WBC 8.3 02/14/2018   HGB 12.8 02/14/2018   HCT 37.4 02/14/2018   MCV 92.2 02/14/2018   PLT 158.0 02/14/2018   NEUTROABS 6.0 02/14/2018    CMP  Lab Results  Component Value Date   NA 137 02/14/2018   K 4.3 02/14/2018   CL 100 02/14/2018   CO2 31 02/14/2018   GLUCOSE 85 02/14/2018   BUN 19 02/14/2018   CREATININE 0.67 02/14/2018   CALCIUM 9.2 02/14/2018   PROT 7.0 02/14/2018   ALBUMIN 4.0 02/14/2018   AST 20 02/14/2018   ALT 27 02/14/2018   ALKPHOS 156 (H) 02/14/2018   BILITOT 0.5 02/14/2018   GFRNONAA >60 10/26/2017   GFRAA >60 10/26/2017     Medications: I have reviewed the patient's current medications.   Assessment/Plan:  1. Pancreas cancer-clinical stage I (T1 N0 M0)   Normal preoperative CA 19-9  Status post a pancreaticoduodenectomy procedure 08/07/2013 confirming a moderately differentiated (T3 N0) tumor with negative surgical margins, 12 negative lymph nodes, no lymphovascular invasion, perineural invasion present  CT abdomen/pelvis 02/15/2014  with no evidence of recurrent/metastatic disease.  CT abdomen/pelvis 01/19/2015 with no evidence of recurrent/metastatic disease.  2. Nausea following the Whipple procedure-improved 3. Mild thrombocytopenia , Chronic    Disposition: [name] remains in clinical remission from pancreas cancer.  She would like to continue follow-up at the Cancer center.  She will return for an office visit in 9 months. We referred her to the Cancer center dietitian for recommendations regarding weight loss.  15 minutes were spent with the patient today.  The majority of the time was used for counseling and coordination of care.  Betsy Coder, MD  05/31/2018  12:20 PM

## 2018-05-31 NOTE — Telephone Encounter (Signed)
Printed avs and calender of upcoming appointment. Per 1/2 los 

## 2018-06-04 ENCOUNTER — Inpatient Hospital Stay: Payer: Medicare Other | Admitting: Nutrition

## 2018-06-04 ENCOUNTER — Other Ambulatory Visit: Payer: Self-pay | Admitting: *Deleted

## 2018-06-04 DIAGNOSIS — C250 Malignant neoplasm of head of pancreas: Secondary | ICD-10-CM

## 2018-06-04 NOTE — Progress Notes (Signed)
Per Ernestene Kiel, referral needs to go to [facility] since she is 5 years out from her diagnosis and only needs weight loss information. Referral placed.

## 2018-06-20 ENCOUNTER — Other Ambulatory Visit: Payer: Self-pay

## 2018-06-20 DIAGNOSIS — I10 Essential (primary) hypertension: Secondary | ICD-10-CM

## 2018-06-21 ENCOUNTER — Encounter: Payer: Self-pay | Admitting: Family Medicine

## 2018-06-21 DIAGNOSIS — R110 Nausea: Secondary | ICD-10-CM | POA: Diagnosis not present

## 2018-06-21 DIAGNOSIS — Z980 Intestinal bypass and anastomosis status: Secondary | ICD-10-CM | POA: Diagnosis not present

## 2018-06-21 DIAGNOSIS — R1310 Dysphagia, unspecified: Secondary | ICD-10-CM | POA: Diagnosis not present

## 2018-06-21 LAB — BASIC METABOLIC PANEL
BUN/Creatinine Ratio: 18 (ref 12–28)
BUN: 14 mg/dL (ref 8–27)
CO2: 27 mmol/L (ref 20–29)
CREATININE: 0.76 mg/dL (ref 0.57–1.00)
Calcium: 9.3 mg/dL (ref 8.7–10.3)
Chloride: 95 mmol/L — ABNORMAL LOW (ref 96–106)
GFR calc Af Amer: 87 mL/min/{1.73_m2} (ref >59)
GFR calc non Af Amer: 75 mL/min/{1.73_m2} (ref >59)
Glucose: 74 mg/dL (ref 65–99)
POTASSIUM: 5.4 mmol/L — AB (ref 3.5–5.2)
SODIUM: 136 mmol/L (ref 134–144)

## 2018-06-22 ENCOUNTER — Telehealth: Payer: Self-pay | Admitting: *Deleted

## 2018-06-22 ENCOUNTER — Telehealth (HOSPITAL_COMMUNITY): Payer: Self-pay | Admitting: Emergency Medicine

## 2018-06-22 DIAGNOSIS — E875 Hyperkalemia: Secondary | ICD-10-CM

## 2018-06-22 NOTE — Telephone Encounter (Signed)
Advised patient of lab results and lab recheck next week, verbalized understanding.

## 2018-06-22 NOTE — Telephone Encounter (Signed)
Pt unaware of appointment.. states her son [name] is her means of transportation and may be out of town. Will reach back out to me if we need to reschedule. Reviewed instructions : NPO 4 hrs, hydrate, take all prescribed meds except HCTZ on Monday AM, and to wear an underwire free bra   Marchia Bond RN Navigator Cardiac Imaging [facility] [phone] Office  [phone] Cell

## 2018-06-22 NOTE — Telephone Encounter (Signed)
-----   Message from Skeet Latch, MD sent at 06/22/2018  8:24 AM EST ----- Potassium is mildly elevated.  Avoid foods high in potassium.  Repeat BMP in 1 week.

## 2018-06-24 ENCOUNTER — Other Ambulatory Visit: Payer: Self-pay | Admitting: Neurology

## 2018-06-25 ENCOUNTER — Ambulatory Visit (HOSPITAL_COMMUNITY)
Admission: RE | Admit: 2018-06-25 | Discharge: 2018-06-25 | Disposition: A | Discharge status: Home / Self Care | Payer: Medicare Other | Source: Ambulatory Visit | Attending: Cardiovascular Disease | Admitting: Cardiovascular Disease

## 2018-06-25 ENCOUNTER — Encounter (HOSPITAL_COMMUNITY): Payer: Self-pay

## 2018-06-25 DIAGNOSIS — R072 Precordial pain: Secondary | ICD-10-CM | POA: Diagnosis not present

## 2018-06-25 MED ORDER — METOPROLOL TARTRATE 5 MG/5ML IV SOLN
INTRAVENOUS | Status: AC
  Administered 2018-06-25: 5 mg via INTRAVENOUS
  Filled 2018-06-25: qty 15

## 2018-06-25 MED ORDER — METOPROLOL TARTRATE 5 MG/5ML IV SOLN
5.0000 mg | Freq: Once | INTRAVENOUS | Status: AC
  Administered 2018-06-25: 5 mg via INTRAVENOUS
  Filled 2018-06-25: qty 5

## 2018-06-25 MED ORDER — IOPAMIDOL (ISOVUE-370) INJECTION 76%
80.0000 mL | Freq: Once | INTRAVENOUS | Status: AC | PRN
  Administered 2018-06-25: 80 mL via INTRAVENOUS

## 2018-06-25 MED ORDER — NITROGLYCERIN 0.4 MG SL SUBL
SUBLINGUAL_TABLET | SUBLINGUAL | Status: AC
  Filled 2018-06-25: qty 2

## 2018-06-25 MED ORDER — NITROGLYCERIN 0.4 MG SL SUBL
0.8000 mg | SUBLINGUAL_TABLET | Freq: Once | SUBLINGUAL | Status: AC
  Administered 2018-06-25: 0.8 mg via SUBLINGUAL
  Filled 2018-06-25: qty 25

## 2018-06-25 MED ORDER — DULOXETINE HCL 60 MG PO CPEP: 60.0000 mg | ORAL_CAPSULE | Freq: Two times a day (BID) | ORAL | 1 refills | Status: DC

## 2018-06-25 NOTE — Progress Notes (Signed)
Pt met discharge criteria, taken to waiting room to meet family, via wheelchair.    Pt tolerated exam without incident.  PIV removed (see charting).  VSS.

## 2018-06-27 DIAGNOSIS — H26491 Other secondary cataract, right eye: Secondary | ICD-10-CM | POA: Diagnosis not present

## 2018-06-27 DIAGNOSIS — H401132 Primary open-angle glaucoma, bilateral, moderate stage: Secondary | ICD-10-CM | POA: Diagnosis not present

## 2018-06-27 DIAGNOSIS — H2512 Age-related nuclear cataract, left eye: Secondary | ICD-10-CM | POA: Diagnosis not present

## 2018-06-27 DIAGNOSIS — Z961 Presence of intraocular lens: Secondary | ICD-10-CM | POA: Diagnosis not present

## 2018-06-28 DIAGNOSIS — L820 Inflamed seborrheic keratosis: Secondary | ICD-10-CM | POA: Diagnosis not present

## 2018-06-28 DIAGNOSIS — L218 Other seborrheic dermatitis: Secondary | ICD-10-CM | POA: Diagnosis not present

## 2018-06-28 DIAGNOSIS — L570 Actinic keratosis: Secondary | ICD-10-CM | POA: Diagnosis not present

## 2018-06-28 DIAGNOSIS — D485 Neoplasm of uncertain behavior of skin: Secondary | ICD-10-CM | POA: Diagnosis not present

## 2018-06-30 ENCOUNTER — Other Ambulatory Visit: Payer: Self-pay | Admitting: Family Medicine

## 2018-07-05 ENCOUNTER — Other Ambulatory Visit: Payer: Self-pay | Admitting: Family Medicine

## 2018-07-09 DIAGNOSIS — E875 Hyperkalemia: Secondary | ICD-10-CM | POA: Diagnosis not present

## 2018-07-10 LAB — BASIC METABOLIC PANEL
BUN/Creatinine Ratio: 15 (ref 12–28)
BUN: 12 mg/dL (ref 8–27)
CO2: 27 mmol/L (ref 20–29)
Calcium: 9 mg/dL (ref 8.7–10.3)
Chloride: 95 mmol/L — ABNORMAL LOW (ref 96–106)
Creatinine, Ser: 0.79 mg/dL (ref 0.57–1.00)
GFR calc Af Amer: 83 mL/min/{1.73_m2} (ref >59)
GFR calc non Af Amer: 72 mL/min/{1.73_m2} (ref >59)
Glucose: 114 mg/dL — ABNORMAL HIGH (ref 65–99)
Potassium: 4.3 mmol/L (ref 3.5–5.2)
Sodium: 137 mmol/L (ref 134–144)

## 2018-07-19 ENCOUNTER — Encounter: Payer: Self-pay | Admitting: Physician Assistant

## 2018-07-19 ENCOUNTER — Ambulatory Visit: Payer: Self-pay | Admitting: *Deleted

## 2018-07-19 ENCOUNTER — Ambulatory Visit (INDEPENDENT_AMBULATORY_CARE_PROVIDER_SITE_OTHER): Payer: Medicare Other | Admitting: Physician Assistant

## 2018-07-19 VITALS — BP 130/80 | HR 76 | Temp 98.7°F | Ht 61.0 in | Wt 157.0 lb

## 2018-07-19 DIAGNOSIS — R1031 Right lower quadrant pain: Secondary | ICD-10-CM | POA: Diagnosis not present

## 2018-07-19 DIAGNOSIS — R109 Unspecified abdominal pain: Secondary | ICD-10-CM | POA: Diagnosis not present

## 2018-07-19 LAB — COMPREHENSIVE METABOLIC PANEL
ALT: 27 U/L (ref 0–35)
AST: 19 U/L (ref 0–37)
Albumin: 4 g/dL (ref 3.5–5.2)
Alkaline Phosphatase: 135 U/L — ABNORMAL HIGH (ref 39–117)
BUN: 16 mg/dL (ref 6–23)
CO2: 32 mEq/L (ref 19–32)
Calcium: 9.1 mg/dL (ref 8.4–10.5)
Chloride: 93 mEq/L — ABNORMAL LOW (ref 96–112)
Creatinine, Ser: 0.76 mg/dL (ref 0.40–1.20)
GFR: 73.43 mL/min (ref >60.00)
Glucose, Bld: 76 mg/dL (ref 70–99)
Potassium: 4.7 mEq/L (ref 3.5–5.1)
Sodium: 130 mEq/L — ABNORMAL LOW (ref 135–145)
Total Bilirubin: 0.4 mg/dL (ref 0.2–1.2)
Total Protein: 6.7 g/dL (ref 6.0–8.3)

## 2018-07-19 LAB — POCT URINALYSIS DIP (MANUAL ENTRY)
Bilirubin, UA: NEGATIVE
Blood, UA: NEGATIVE
Glucose, UA: NEGATIVE mg/dL
Ketones, POC UA: NEGATIVE mg/dL
Nitrite, UA: NEGATIVE
Protein Ur, POC: NEGATIVE mg/dL
Spec Grav, UA: 1.01 (ref 1.010–1.025)
Urobilinogen, UA: 0.2 E.U./dL
pH, UA: 6 (ref 5.0–8.0)

## 2018-07-19 LAB — CBC WITH DIFFERENTIAL/PLATELET
Basophils Absolute: 0.1 10*3/uL (ref 0.0–0.1)
Basophils Relative: 0.7 % (ref 0.0–3.0)
Eosinophils Absolute: 0.2 10*3/uL (ref 0.0–0.7)
Eosinophils Relative: 2.7 % (ref 0.0–5.0)
HCT: 38.7 % (ref 36.0–46.0)
Hemoglobin: 13.4 g/dL (ref 12.0–15.0)
Lymphocytes Relative: 17.6 % (ref 12.0–46.0)
Lymphs Abs: 1.4 10*3/uL (ref 0.7–4.0)
MCHC: 34.5 g/dL (ref 30.0–36.0)
MCV: 93.6 fl (ref 78.0–100.0)
MONO ABS: 0.6 10*3/uL (ref 0.1–1.0)
Monocytes Relative: 7.9 % (ref 3.0–12.0)
Neutro Abs: 5.6 10*3/uL (ref 1.4–7.7)
Neutrophils Relative %: 71.1 % (ref 43.0–77.0)
Platelets: 133 10*3/uL — ABNORMAL LOW (ref 150.0–400.0)
RBC: 4.14 Mil/uL (ref 3.87–5.11)
RDW: 14.6 % (ref 11.5–15.5)
WBC: 7.9 10*3/uL (ref 4.0–10.5)

## 2018-07-19 MED ORDER — CEPHALEXIN 500 MG PO CAPS: 500.0000 mg | ORAL_CAPSULE | Freq: Three times a day (TID) | ORAL | 0 refills | Status: AC

## 2018-07-19 NOTE — Telephone Encounter (Signed)
Provider aware
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
## 2018-07-19 NOTE — Telephone Encounter (Signed)
See note

## 2018-07-19 NOTE — Telephone Encounter (Signed)
Patient is calling to reports she has had pain on her RLQ for about a week now. Pain is from naval to R side and covering the entire R quadrant. Patient states she has history of cancer- pancreatitic(2015). Patient states she feels hard- firm area when palpates the area. Patient reports the pain is worse when she presses in the area.  Reason for Disposition . [1] MILD-MODERATE pain AND [2] constant AND [3] present > 2 hours  Answer Assessment - Initial Assessment Questions 1. LOCATION: "Where does it hurt?"      RLQ 2. RADIATION: "Does the pain shoot anywhere else?" (e.g., chest, back)     From naval to R side 3. ONSET: "When did the pain begin?" (e.g., minutes, hours or days ago)      Pain started 1 week ago 4. SUDDEN: "Gradual or sudden onset?"     sudden 5. PATTERN "Does the pain come and go, or is it constant?"    - If constant: "Is it getting better, staying the same, or worsening?"      (Note: Constant means the pain never goes away completely; most serious pain is constant and it progresses)     - If intermittent: "How long does it last?" "Do you have pain now?"     (Note: Intermittent means the pain goes away completely between bouts)     constant 6. SEVERITY: "How bad is the pain?"  (e.g., Scale 1-10; mild, moderate, or severe)   - MILD (1-3): doesn't interfere with normal activities, abdomen soft and not tender to touch    - MODERATE (4-7): interferes with normal activities or awakens from sleep, tender to touch    - SEVERE (8-10): excruciating pain, doubled over, unable to do any normal activities      5-6- moderate- presses makes it hurt more 7. RECURRENT SYMPTOM: "Have you ever had this type of abdominal pain before?" If so, ask: "When was the last time?" and "What happened that time?"      That type of pain when her cancer was diagnosed 8. CAUSE: "What do you think is causing the abdominal pain?"     Unsure- but concerned 9. RELIEVING/AGGRAVATING FACTORS: "What makes it  better or worse?" (e.g., movement, antacids, bowel movement)     No- constant 10. OTHER SYMPTOMS: "Has there been any vomiting, diarrhea, constipation, or urine problems?"       no 11. PREGNANCY: "Is there any chance you are pregnant?" "When was your last menstrual period?"       n/a  Protocols used: ABDOMINAL PAIN - [facility]

## 2018-07-19 NOTE — Patient Instructions (Signed)
It was great to see you!  Please start oral antibiotic for possible urine and kidney infection.  We will call you as soon as we schedule the CT scan.  Contact a doctor if:  Your belly pain changes or gets worse.  You are not hungry, or you lose weight without trying.  You are having trouble pooping (constipated) or have watery poop (diarrhea) for more than 2-3 days.  You have pain when you pee or poop.  Your belly pain wakes you up at night.  Your pain gets worse with meals, after eating, or with certain foods.  You are throwing up and cannot keep anything down.  You have a fever. Get help right away if:  Your pain does not go away as soon as your doctor says it should.  You cannot stop throwing up.  Your pain is only in areas of your belly, such as the right side or the left lower part of the belly.  You have bloody or black poop, or poop that looks like tar.  You have very bad pain, cramping, or bloating in your belly.  You have signs of not having enough fluid or water in your body (dehydration), such as: ? Dark pee, very little pee, or no pee. ? Cracked lips. ? Dry mouth. ? Sunken eyes. ? Sleepiness. ? Weakness. This information is not intended to replace advice given to you by your health care provider. Make sure you discuss any questions you have with your health care provider.  Take care,  Inda Coke PA-C

## 2018-07-20 ENCOUNTER — Ambulatory Visit (HOSPITAL_COMMUNITY): Payer: Medicare Other

## 2018-07-20 ENCOUNTER — Other Ambulatory Visit: Payer: Self-pay

## 2018-07-20 ENCOUNTER — Emergency Department (HOSPITAL_COMMUNITY)
Admission: EM | Admit: 2018-07-20 | Discharge: 2018-07-20 | Disposition: A | Discharge status: Home / Self Care | Payer: Medicare Other | Attending: Emergency Medicine | Admitting: Emergency Medicine

## 2018-07-20 ENCOUNTER — Ambulatory Visit
Admission: RE | Admit: 2018-07-20 | Discharge: 2018-07-20 | Disposition: A | Discharge status: Home / Self Care | Payer: Medicare Other | Source: Ambulatory Visit | Attending: Physician Assistant | Admitting: Physician Assistant

## 2018-07-20 ENCOUNTER — Encounter (HOSPITAL_COMMUNITY): Payer: Self-pay | Admitting: Emergency Medicine

## 2018-07-20 DIAGNOSIS — I10 Essential (primary) hypertension: Secondary | ICD-10-CM | POA: Insufficient documentation

## 2018-07-20 DIAGNOSIS — R1084 Generalized abdominal pain: Secondary | ICD-10-CM

## 2018-07-20 DIAGNOSIS — Z79899 Other long term (current) drug therapy: Secondary | ICD-10-CM | POA: Diagnosis not present

## 2018-07-20 DIAGNOSIS — K5900 Constipation, unspecified: Secondary | ICD-10-CM

## 2018-07-20 DIAGNOSIS — Z96652 Presence of left artificial knee joint: Secondary | ICD-10-CM | POA: Diagnosis not present

## 2018-07-20 DIAGNOSIS — D696 Thrombocytopenia, unspecified: Secondary | ICD-10-CM | POA: Insufficient documentation

## 2018-07-20 DIAGNOSIS — R1031 Right lower quadrant pain: Secondary | ICD-10-CM

## 2018-07-20 DIAGNOSIS — C259 Malignant neoplasm of pancreas, unspecified: Secondary | ICD-10-CM | POA: Insufficient documentation

## 2018-07-20 DIAGNOSIS — E039 Hypothyroidism, unspecified: Secondary | ICD-10-CM | POA: Diagnosis not present

## 2018-07-20 LAB — COMPREHENSIVE METABOLIC PANEL
ALT: 30 U/L (ref 0–44)
AST: 28 U/L (ref 15–41)
Albumin: 4.3 g/dL (ref 3.5–5.0)
Alkaline Phosphatase: 144 U/L — ABNORMAL HIGH (ref 38–126)
Anion gap: 11 (ref 5–15)
BUN: 19 mg/dL (ref 8–23)
CO2: 28 mmol/L (ref 22–32)
Calcium: 8.9 mg/dL (ref 8.9–10.3)
Chloride: 97 mmol/L — ABNORMAL LOW (ref 98–111)
Creatinine, Ser: 0.88 mg/dL (ref 0.44–1.00)
GFR calc Af Amer: >60 mL/min (ref >60)
GFR calc non Af Amer: >60 mL/min (ref >60)
Glucose, Bld: 143 mg/dL — ABNORMAL HIGH (ref 70–99)
Potassium: 4 mmol/L (ref 3.5–5.1)
SODIUM: 136 mmol/L (ref 135–145)
Total Bilirubin: 0.5 mg/dL (ref 0.3–1.2)
Total Protein: 7 g/dL (ref 6.5–8.1)

## 2018-07-20 LAB — CBC WITH DIFFERENTIAL/PLATELET
Abs Immature Granulocytes: 0.05 10*3/uL (ref 0.00–0.07)
Basophils Absolute: 0 10*3/uL (ref 0.0–0.1)
Basophils Relative: 0 %
EOS ABS: 0.2 10*3/uL (ref 0.0–0.5)
EOS PCT: 3 %
HCT: 39.8 % (ref 36.0–46.0)
Hemoglobin: 12.9 g/dL (ref 12.0–15.0)
Immature Granulocytes: 1 %
Lymphocytes Relative: 18 %
Lymphs Abs: 1.3 10*3/uL (ref 0.7–4.0)
MCH: 32.2 pg (ref 26.0–34.0)
MCHC: 32.4 g/dL (ref 30.0–36.0)
MCV: 99.3 fL (ref 80.0–100.0)
MONO ABS: 0.5 10*3/uL (ref 0.1–1.0)
Monocytes Relative: 7 %
Neutro Abs: 4.9 10*3/uL (ref 1.7–7.7)
Neutrophils Relative %: 71 %
Platelets: 122 10*3/uL — ABNORMAL LOW (ref 150–400)
RBC: 4.01 MIL/uL (ref 3.87–5.11)
RDW: 14.3 % (ref 11.5–15.5)
WBC: 6.9 10*3/uL (ref 4.0–10.5)
nRBC: 0 % (ref 0.0–0.2)

## 2018-07-20 LAB — URINALYSIS, ROUTINE W REFLEX MICROSCOPIC
BACTERIA UA: NONE SEEN
Bilirubin Urine: NEGATIVE
Glucose, UA: NEGATIVE mg/dL
Hgb urine dipstick: NEGATIVE
Ketones, ur: NEGATIVE mg/dL
Nitrite: NEGATIVE
PH: 6 (ref 5.0–8.0)
Protein, ur: NEGATIVE mg/dL
Specific Gravity, Urine: 1.041 — ABNORMAL HIGH (ref 1.005–1.030)

## 2018-07-20 MED ORDER — IOPAMIDOL (ISOVUE-300) INJECTION 61%
100.0000 mL | Freq: Once | INTRAVENOUS | Status: AC | PRN
  Administered 2018-07-20: 100 mL via INTRAVENOUS

## 2018-07-20 NOTE — Discharge Instructions (Signed)
Your CT scan today showed recurrence of your pancreatic cancer, unfortunately. You will need to see your oncologist in the next few days in order to discuss your options for treatment, and where to go from here with this finding. Your CT also showed some constipation, use over the counter miralax and/or colace to help with this. Stay well hydrated and eat more fiber in your diet. You may use tylenol or ibuprofen as needed for pain. Follow up with your regular doctor in 1 week for recheck of symptoms and ongoing management of your abdominal pain. Return to the ER for emergent changes or worsening symptoms.

## 2018-07-20 NOTE — ED Notes (Signed)
Temp 98.1F

## 2018-07-20 NOTE — ED Triage Notes (Signed)
Patient sent from [place] after CT abdomen for further evaluation. C/o abdominal pain x1 week. Denies N/V/D. Hx pancreatic cancer.
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
## 2018-07-21 LAB — URINE CULTURE
MICRO NUMBER:: 221350
SPECIMEN QUALITY: ADEQUATE

## 2018-07-23 ENCOUNTER — Telehealth: Payer: Self-pay | Admitting: Oncology

## 2018-07-23 NOTE — Telephone Encounter (Signed)
Per sch msg, patient wanted to sch appt. Spoke with son [name], says result of CT scan shows that cancer has come back, transferred to Nurse to schedule appt

## 2018-07-24 ENCOUNTER — Telehealth: Payer: Self-pay | Admitting: *Deleted

## 2018-07-24 ENCOUNTER — Ambulatory Visit: Payer: Medicare Other | Admitting: Family Medicine

## 2018-07-24 NOTE — Telephone Encounter (Signed)
Provided appointment with Dr. [name] on 2/28 at 12:30.

## 2018-07-27 ENCOUNTER — Inpatient Hospital Stay: Payer: Medicare Other | Attending: Oncology | Admitting: Oncology

## 2018-07-27 ENCOUNTER — Other Ambulatory Visit: Payer: Self-pay | Admitting: Cardiovascular Disease

## 2018-07-27 ENCOUNTER — Inpatient Hospital Stay: Payer: Medicare Other

## 2018-07-27 ENCOUNTER — Telehealth: Payer: Self-pay | Admitting: Oncology

## 2018-07-27 VITALS — BP 141/82 | HR 78 | Temp 98.2°F | Resp 18 | Ht 61.0 in | Wt 155.4 lb

## 2018-07-27 DIAGNOSIS — C250 Malignant neoplasm of head of pancreas: Secondary | ICD-10-CM

## 2018-07-27 DIAGNOSIS — R112 Nausea with vomiting, unspecified: Secondary | ICD-10-CM | POA: Diagnosis not present

## 2018-07-27 DIAGNOSIS — D696 Thrombocytopenia, unspecified: Secondary | ICD-10-CM

## 2018-07-27 DIAGNOSIS — M549 Dorsalgia, unspecified: Secondary | ICD-10-CM | POA: Diagnosis not present

## 2018-07-27 DIAGNOSIS — C259 Malignant neoplasm of pancreas, unspecified: Secondary | ICD-10-CM | POA: Diagnosis not present

## 2018-07-27 LAB — CEA (IN HOUSE-CHCC): CEA (CHCC-IN HOUSE): 5.54 ng/mL — AB (ref 0.00–5.00)

## 2018-07-27 MED ORDER — HYDROCODONE-ACETAMINOPHEN 5-325 MG PO TABS: 1.0000 | ORAL_TABLET | Freq: Four times a day (QID) | ORAL | 0 refills | Status: DC | PRN

## 2018-07-27 NOTE — Progress Notes (Signed)
[facility] OFFICE PROGRESS NOTE   Diagnosis: Pancreas cancer  INTERVAL HISTORY:   [name] returns prior to a scheduled visit.  She reports a 2-week history of lower back/abdomen pain with associated nausea.  She has vomited on a few occasions.  She presented to the emergency room and a CT of the abdomen and pelvis on 07/20/2018 revealed tumor recurrence at the pancreas resection bed.  Tumor involves the proximal SMA, and there is occlusion of the SMV with venous collaterals.  There is a new L2 compression fracture.  She takes Tylenol and ibuprofen for pain.  She reports a good appetite.  Objective:  Vital signs in last 24 hours:  Blood pressure (!) 141/82, pulse 78, temperature 98.2 F (36.8 C), temperature source Oral, resp. rate 18, height 5\' 1"  (1.549 m), weight 155 lb 6.4 oz (70.5 kg), SpO2 98 %.    HEENT: Neck without mass Lymphatics: No cervical, supraclavicular, axillary, or inguinal nodes Resp: Lungs clear bilaterally Cardio: Regular rate and rhythm GI: No hepatosplenomegaly, no apparent ascites, no mass, mild diffuse tenderness Vascular: No leg edema  Lab Results:  Lab Results  Component Value Date   WBC 6.9 07/20/2018   HGB 12.9 07/20/2018   HCT 39.8 07/20/2018   MCV 99.3 07/20/2018   PLT 122 (L) 07/20/2018   NEUTROABS 4.9 07/20/2018    CMP  Lab Results  Component Value Date   NA 136 07/20/2018   K 4.0 07/20/2018   CL 97 (L) 07/20/2018   CO2 28 07/20/2018   GLUCOSE 143 (H) 07/20/2018   BUN 19 07/20/2018   CREATININE 0.88 07/20/2018   CALCIUM 8.9 07/20/2018   PROT 7.0 07/20/2018   ALBUMIN 4.3 07/20/2018   AST 28 07/20/2018   ALT 30 07/20/2018   ALKPHOS 144 (H) 07/20/2018   BILITOT 0.5 07/20/2018   GFRNONAA >60 07/20/2018   GFRAA >60 07/20/2018    No results found for: CEA1  Lab Results  Component Value Date   INR 1.28 08/08/2013    Imaging:  No results found.  Medications: I have reviewed the patient's current  medications.   Assessment/Plan: 1. 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  CT abdomen/pelvis 01/19/2015 with no evidence of recurrent/metastatic disease.  CT abdomen/pelvis 07/20/2018- infiltrating soft tissue at the pancreas resection bed with involvement of the SMA, occlusion of the SMV with collaterals.  New L2 compression fracture  2. Nausea following the Whipple procedure-improved 3. Mild thrombocytopenia , Chronic 4.  Pain secondary to local recurrence of pancreas cancer    Disposition: [name] has a remote history of pancreas cancer.  She presents with nausea and abdomen/back pain.  The symptoms are likely related to local recurrence of tumor involving the pancreas resection bed.  I discussed the probable diagnosis and treatment options with [name] and her family.  We reviewed the CT images.  I think it would be difficult to biopsy this area.  We checked a CEA and CA 19-9 today.  She will be referred for a staging chest CT.  I discussed the case with Dr. Lisbeth Renshaw.  He will likely recommend SBRT.  We will prescribe tramadol for pain.  [name] will return for an office visit in 3-4 weeks.  She will contact us in the interim as needed.  I will present her case at the GI tumor conference.  40 minutes were spent with the patient today.  The majority of the time was used for counseling and coordination of care.  Betsy Coder, MD  07/27/2018  12:49 PM

## 2018-07-27 NOTE — Telephone Encounter (Signed)
Gave avs and calendar ° °

## 2018-07-28 LAB — CANCER ANTIGEN 19-9: CA 19-9: 1 U/mL (ref 0–35)

## 2018-07-30 NOTE — Progress Notes (Signed)
GI Location of Tumor / Histology: Carcinoma of head of pancreas  [name] presented with 2 week history of lower back/abdominal pain with associated nausea.  She has had a few occasions of vomiting.  CT Chest 08/02/2018:  CT Abd/pelvis 07/20/2018: Recurrent abnormal soft tissue at the site of the pancreatic resection, new from the prior, with irregular soft tissue centered between the superior mesenteric artery, IVC, aorta, at the base of mesentery.  The residual pancreatic tissue of the pancreatic tail is unremarkable.  Biopsies of   Past/Anticipated interventions by surgeon, if any:     Past/Anticipated interventions by medical oncology, if any:  Dr. [name] 07/27/2018 -Ms. Puett has a remote history of pancreas cancer.  The symptoms are likely related to local recurrence of tumor involving the pancreas resection bed.   -I discussed the probable diagnosis and treatment options. -I think it would be difficult to biopsy this area.  She will be referred for a staging chest CT. -I discussed the case with Dr. Lisbeth Renshaw.  He will likely recommend SBRT.  -Ms. Salem will return for office visit in 3-4 weeks.  I will present her case at the GI tumor conference.   Weight changes, if any: Noine  Bowel/Bladder complaints, if any: States that she has diarrhea sometime . States that she has bladder control issues.  Nausea / Vomiting, if any: States that she has had some nausea and vomting  Pain issues, if any:  Pain in abdomen area  Any blood per rectum:   None    SAFETY ISSUES:  Prior radiation?  No  Pacemaker/ICD?  No  Possible current pregnancy? Abdominal hysterectomy   Is the patient on methotrexate?   Current Complaints/Details: History -Status post pancreaticoduodenectomy procedure 08/07/2013- moderately differentiated tumor with negative surgical margins, 12 negative lymph nodes, no lymphovascular invasion, perineural invasion present. Vitals:   07/31/18 1231  BP: (!)  143/73  Pulse: 72  Resp: 18  Temp: 98.2 F (36.8 C)  TempSrc: Oral  SpO2: 97%  Weight: 154 lb 2 oz (69.9 kg)   Wt Readings from Last 3 Encounters:  07/31/18 154 lb 2 oz (69.9 kg)  07/27/18 155 lb 6.4 oz (70.5 kg)  07/20/18 155 lb (70.3 kg)    -
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
## 2018-07-31 ENCOUNTER — Ambulatory Visit
Admission: RE | Admit: 2018-07-31 | Discharge: 2018-07-31 | Disposition: A | Discharge status: Home / Self Care | Payer: Medicare Other | Source: Ambulatory Visit | Attending: Radiation Oncology | Admitting: Radiation Oncology

## 2018-07-31 ENCOUNTER — Encounter: Payer: Self-pay | Admitting: Radiation Oncology

## 2018-07-31 ENCOUNTER — Other Ambulatory Visit: Payer: Self-pay

## 2018-07-31 VITALS — BP 143/73 | HR 72 | Temp 98.2°F | Resp 18 | Wt 154.1 lb

## 2018-07-31 DIAGNOSIS — I10 Essential (primary) hypertension: Secondary | ICD-10-CM | POA: Diagnosis not present

## 2018-07-31 DIAGNOSIS — K5900 Constipation, unspecified: Secondary | ICD-10-CM | POA: Diagnosis not present

## 2018-07-31 DIAGNOSIS — I341 Nonrheumatic mitral (valve) prolapse: Secondary | ICD-10-CM | POA: Insufficient documentation

## 2018-07-31 DIAGNOSIS — Z79899 Other long term (current) drug therapy: Secondary | ICD-10-CM | POA: Diagnosis not present

## 2018-07-31 DIAGNOSIS — E039 Hypothyroidism, unspecified: Secondary | ICD-10-CM | POA: Insufficient documentation

## 2018-07-31 DIAGNOSIS — M1990 Unspecified osteoarthritis, unspecified site: Secondary | ICD-10-CM | POA: Insufficient documentation

## 2018-07-31 DIAGNOSIS — Z8673 Personal history of transient ischemic attack (TIA), and cerebral infarction without residual deficits: Secondary | ICD-10-CM | POA: Diagnosis not present

## 2018-07-31 DIAGNOSIS — C259 Malignant neoplasm of pancreas, unspecified: Secondary | ICD-10-CM | POA: Diagnosis present

## 2018-07-31 DIAGNOSIS — C250 Malignant neoplasm of head of pancreas: Secondary | ICD-10-CM

## 2018-07-31 DIAGNOSIS — G893 Neoplasm related pain (acute) (chronic): Secondary | ICD-10-CM | POA: Diagnosis not present

## 2018-08-01 ENCOUNTER — Encounter: Payer: Self-pay | Admitting: General Practice

## 2018-08-01 NOTE — Progress Notes (Signed)
[place] Psychosocial Distress Screening Clinical Social Work  Clinical Social Work was referred by distress screening protocol.  The patient scored a 6 on the Psychosocial Distress Thermometer which indicates moderate distress. Clinical Social Worker contacted patient by phone to assess for distress and other psychosocial needs. CSW and patient discussed common feeling and emotions when being diagnosed with cancer, and the importance of support during treatment. CSW informed patient of the support team and support services at [facility]. CSW provided contact information and encouraged patient to call with any questions or concerns.  Son helps at home, will transport to appointments as needed.  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  Husband died in 2001-09-17.  Has support from sons, daughter in law and church.  Struggling with decisions related to treatment, aware of current treatment plan.   ONCBCN DISTRESS SCREENING 07/31/2018  Screening Type Initial Screening  Distress experienced in past week (1-10) 6  Emotional problem type Nervousness/Anxiety;Adjusting to illness  Spiritual/Religous concerns type Facing my mortality  Information Concerns Type Lack of info about treatment;Lack of info about diagnosis  Physical Problem type Pain;Nausea/vomiting;Talking;Changes in urination;Tingling hands/feet;Skin dry/itchy    Clinical Social Worker follow up needed: No.  If yes, follow up plan:  Beverely Pace, [place], LCSW Clinical Social Worker Phone:  [phone]

## 2018-08-02 ENCOUNTER — Ambulatory Visit (HOSPITAL_COMMUNITY)
Admission: RE | Admit: 2018-08-02 | Discharge: 2018-08-02 | Disposition: A | Discharge status: Home / Self Care | Payer: Medicare Other | Source: Ambulatory Visit | Attending: Oncology | Admitting: Oncology

## 2018-08-02 ENCOUNTER — Encounter: Payer: Self-pay | Admitting: Cardiovascular Disease

## 2018-08-02 ENCOUNTER — Ambulatory Visit (INDEPENDENT_AMBULATORY_CARE_PROVIDER_SITE_OTHER): Payer: Medicare Other | Admitting: Cardiovascular Disease

## 2018-08-02 ENCOUNTER — Other Ambulatory Visit: Payer: Self-pay | Admitting: Family Medicine

## 2018-08-02 VITALS — BP 130/72 | HR 83 | Ht 61.0 in | Wt 154.4 lb

## 2018-08-02 DIAGNOSIS — E7800 Pure hypercholesterolemia, unspecified: Secondary | ICD-10-CM | POA: Diagnosis not present

## 2018-08-02 DIAGNOSIS — I10 Essential (primary) hypertension: Secondary | ICD-10-CM

## 2018-08-02 DIAGNOSIS — C250 Malignant neoplasm of head of pancreas: Secondary | ICD-10-CM | POA: Insufficient documentation

## 2018-08-02 DIAGNOSIS — Z5181 Encounter for therapeutic drug level monitoring: Secondary | ICD-10-CM

## 2018-08-02 DIAGNOSIS — R0602 Shortness of breath: Secondary | ICD-10-CM

## 2018-08-02 DIAGNOSIS — C259 Malignant neoplasm of pancreas, unspecified: Secondary | ICD-10-CM | POA: Diagnosis not present

## 2018-08-02 DIAGNOSIS — I2510 Atherosclerotic heart disease of native coronary artery without angina pectoris: Secondary | ICD-10-CM | POA: Diagnosis not present
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
## 2018-08-02 NOTE — Patient Instructions (Signed)
Medication Instructions:  Your physician recommends that you continue on your current medications as directed. Please refer to the Current Medication list given to you today.  If you need a refill on your cardiac medications before your next appointment, please call your pharmacy.   Lab work: FASTING LP/CMET WHEN YOU GO FOR YOUR ECHO   If you have labs (blood work) drawn today and your tests are completely normal, you will receive your results only by: Marland Kitchen MyChart Message (if you have MyChart) OR . A paper copy in the mail If you have any lab test that is abnormal or we need to change your treatment, we will call you to review the results.  Testing/Procedures: Your physician has requested that you have an echocardiogram. Echocardiography is a painless test that uses sound waves to create images of your heart. It provides your doctor with information about the size and shape of your heart and how well your heart's chambers and valves are working. This procedure takes approximately one hour. There are no restrictions for this procedure. [place] [address]  Follow-Up: At [facility], you and your health needs are our priority.  As part of our continuing mission to provide you with exceptional heart care, we have created designated Provider Care Teams.  These Care Teams include your primary Cardiologist (physician) and Advanced Practice Providers (APPs -  Physician Assistants and Nurse Practitioners) who all work together to provide you with the care you need, when you need it. You will need a follow up appointment in 6 months.  Please call our office 2 months in advance to schedule this appointment.  You may see Skeet Latch, MD or one of the following Advanced Practice Providers on your designated Care Team:   Kerin Ransom, PA-C Roby Lofts, [place] . Sande Rives, PA-C  Any Other Special Instructions Will Be Listed Below (If Applicable).   Echocardiogram An  echocardiogram is a procedure that uses painless sound waves (ultrasound) to produce an image of the heart. Images from an echocardiogram can provide important information about:  Signs of coronary artery disease (CAD).  Aneurysm detection. An aneurysm is a weak or damaged part of an artery wall that bulges out from the normal force of blood pumping through the body.  Heart size and shape. Changes in the size or shape of the heart can be associated with certain conditions, including heart failure, aneurysm, and CAD.  Heart muscle function.  Heart valve function.  Signs of a past heart attack.  Fluid buildup around the heart.  Thickening of the heart muscle.  A tumor or infectious growth around the heart valves. Tell a health care provider about:  Any allergies you have.  All medicines you are taking, including vitamins, herbs, eye drops, creams, and over-the-counter medicines.  Any blood disorders you have.  Any surgeries you have had.  Any medical conditions you have.  Whether you are pregnant or may be pregnant. What are the risks? Generally, this is a safe procedure. However, problems may occur, including:  Allergic reaction to dye (contrast) that may be used during the procedure. What happens before the procedure? No specific preparation is needed. You may eat and drink normally. What happens during the procedure?   An IV tube may be inserted into one of your veins.  You may receive contrast through this tube. A contrast is an injection that improves the quality of the pictures from your heart.  A gel will be applied  to your chest.  A wand-like tool (transducer) will be moved over your chest. The gel will help to transmit the sound waves from the transducer.  The sound waves will harmlessly bounce off of your heart to allow the heart images to be captured in real-time motion. The images will be recorded on a computer. The procedure may vary among health care  providers and hospitals. What happens after the procedure?  You may return to your normal, everyday life, including diet, activities, and medicines, unless your health care provider tells you not to do that. Summary  An echocardiogram is a procedure that uses painless sound waves (ultrasound) to produce an image of the heart.  Images from an echocardiogram can provide important information about the size and shape of your heart, heart muscle function, heart valve function, and fluid buildup around your heart.  You do not need to do anything to prepare before this procedure. You may eat and drink normally.  After the echocardiogram is completed, you may return to your normal, everyday life, unless your health care provider tells you not to do that. This information is not intended to replace advice given to you by your health care provider. Make sure you discuss any questions you have with your health care provider. Document Released: 05/13/2000 Document Revised: 06/18/2016 Document Reviewed: 06/18/2016 Elsevier Interactive Patient Education  2019 [employer].

## 2018-08-03 ENCOUNTER — Other Ambulatory Visit: Payer: Self-pay | Admitting: Family Medicine

## 2018-08-06 ENCOUNTER — Other Ambulatory Visit: Payer: Self-pay | Admitting: Family Medicine

## 2018-08-08 ENCOUNTER — Ambulatory Visit
Admission: RE | Admit: 2018-08-08 | Discharge: 2018-08-08 | Disposition: A | Discharge status: Home / Self Care | Payer: Medicare Other | Source: Ambulatory Visit | Attending: Radiation Oncology | Admitting: Radiation Oncology

## 2018-08-08 ENCOUNTER — Other Ambulatory Visit: Payer: Self-pay

## 2018-08-08 VITALS — BP 136/57 | HR 89 | Temp 97.7°F | Resp 18 | Wt 154.0 lb

## 2018-08-08 DIAGNOSIS — Z8673 Personal history of transient ischemic attack (TIA), and cerebral infarction without residual deficits: Secondary | ICD-10-CM | POA: Insufficient documentation

## 2018-08-08 DIAGNOSIS — Z96652 Presence of left artificial knee joint: Secondary | ICD-10-CM | POA: Diagnosis not present

## 2018-08-08 DIAGNOSIS — G629 Polyneuropathy, unspecified: Secondary | ICD-10-CM | POA: Diagnosis not present

## 2018-08-08 DIAGNOSIS — F329 Major depressive disorder, single episode, unspecified: Secondary | ICD-10-CM | POA: Diagnosis not present

## 2018-08-08 DIAGNOSIS — I10 Essential (primary) hypertension: Secondary | ICD-10-CM | POA: Diagnosis not present

## 2018-08-08 DIAGNOSIS — Z803 Family history of malignant neoplasm of breast: Secondary | ICD-10-CM | POA: Insufficient documentation

## 2018-08-08 DIAGNOSIS — K5900 Constipation, unspecified: Secondary | ICD-10-CM | POA: Diagnosis not present

## 2018-08-08 DIAGNOSIS — Z79890 Hormone replacement therapy (postmenopausal): Secondary | ICD-10-CM | POA: Insufficient documentation

## 2018-08-08 DIAGNOSIS — C250 Malignant neoplasm of head of pancreas: Secondary | ICD-10-CM | POA: Insufficient documentation

## 2018-08-08 DIAGNOSIS — Z510 Encounter for antineoplastic radiation therapy: Secondary | ICD-10-CM | POA: Diagnosis not present

## 2018-08-08 DIAGNOSIS — Z79899 Other long term (current) drug therapy: Secondary | ICD-10-CM | POA: Diagnosis not present

## 2018-08-08 DIAGNOSIS — C801 Malignant (primary) neoplasm, unspecified: Secondary | ICD-10-CM

## 2018-08-08 DIAGNOSIS — E039 Hypothyroidism, unspecified: Secondary | ICD-10-CM | POA: Insufficient documentation

## 2018-08-08 MED ORDER — SODIUM CHLORIDE 0.9% FLUSH
10.0000 mL | INTRAVENOUS | Status: AC
  Administered 2018-08-08: 10 mL via INTRAVENOUS

## 2018-08-08 NOTE — Progress Notes (Signed)
Has armband been applied?  Yes  Does patient have an allergy to IV contrast dye?: No   Has patient ever received premedication for IV contrast dye?: N/A  Does patient take metformin?: No  If patient does take metformin when was the last dose: N/A  Date of lab work: 07/20/2018 BUN: 19 CR: 0.88 EGfr: >60  IV site: Right AC  Has IV site been added to flowsheet?  Yes

## 2018-08-10 DIAGNOSIS — K5900 Constipation, unspecified: Secondary | ICD-10-CM | POA: Diagnosis not present

## 2018-08-10 DIAGNOSIS — Z510 Encounter for antineoplastic radiation therapy: Secondary | ICD-10-CM | POA: Diagnosis not present

## 2018-08-10 DIAGNOSIS — F329 Major depressive disorder, single episode, unspecified: Secondary | ICD-10-CM | POA: Diagnosis not present

## 2018-08-10 DIAGNOSIS — Z803 Family history of malignant neoplasm of breast: Secondary | ICD-10-CM | POA: Diagnosis not present

## 2018-08-10 DIAGNOSIS — I10 Essential (primary) hypertension: Secondary | ICD-10-CM | POA: Diagnosis not present

## 2018-08-10 DIAGNOSIS — C250 Malignant neoplasm of head of pancreas: Secondary | ICD-10-CM | POA: Diagnosis not present

## 2018-08-13 ENCOUNTER — Telehealth: Payer: Self-pay | Admitting: Neurology

## 2018-08-13 NOTE — Telephone Encounter (Signed)
I called the patient. We will reschedule her appointment for 3/19 due to coronavirus. Our office will be reaching out to reschedule. She reports she will be starting radiation soon for her pancreatic cancer.

## 2018-08-14 ENCOUNTER — Other Ambulatory Visit: Payer: Self-pay

## 2018-08-14 ENCOUNTER — Ambulatory Visit (HOSPITAL_COMMUNITY): Payer: Medicare Other | Attending: Cardiovascular Disease

## 2018-08-14 ENCOUNTER — Other Ambulatory Visit: Payer: Medicare Other

## 2018-08-14 DIAGNOSIS — R0602 Shortness of breath: Secondary | ICD-10-CM | POA: Diagnosis not present

## 2018-08-14 DIAGNOSIS — E7800 Pure hypercholesterolemia, unspecified: Secondary | ICD-10-CM

## 2018-08-14 DIAGNOSIS — I10 Essential (primary) hypertension: Secondary | ICD-10-CM

## 2018-08-14 DIAGNOSIS — Z5181 Encounter for therapeutic drug level monitoring: Secondary | ICD-10-CM | POA: Diagnosis not present

## 2018-08-14 LAB — LIPID PANEL
Chol/HDL Ratio: 2.9 ratio (ref 0.0–4.4)
Cholesterol, Total: 114 mg/dL (ref 100–199)
HDL: 40 mg/dL (ref >39)
LDL Calculated: 48 mg/dL (ref 0–99)
Triglycerides: 128 mg/dL (ref 0–149)
VLDL Cholesterol Cal: 26 mg/dL (ref 5–40)

## 2018-08-14 LAB — COMPREHENSIVE METABOLIC PANEL
ALT: 41 IU/L — ABNORMAL HIGH (ref 0–32)
AST: 30 IU/L (ref 0–40)
Albumin/Globulin Ratio: 2.2 (ref 1.2–2.2)
Albumin: 4.4 g/dL (ref 3.7–4.7)
Alkaline Phosphatase: 172 IU/L — ABNORMAL HIGH (ref 39–117)
BUN/Creatinine Ratio: 18 (ref 12–28)
BUN: 17 mg/dL (ref 8–27)
Bilirubin Total: 0.4 mg/dL (ref 0.0–1.2)
CO2: 25 mmol/L (ref 20–29)
Calcium: 9.2 mg/dL (ref 8.7–10.3)
Chloride: 96 mmol/L (ref 96–106)
Creatinine, Ser: 0.95 mg/dL (ref 0.57–1.00)
GFR calc Af Amer: 66 mL/min/{1.73_m2} (ref >59)
GFR calc non Af Amer: 58 mL/min/{1.73_m2} — ABNORMAL LOW (ref >59)
GLOBULIN, TOTAL: 2 g/dL (ref 1.5–4.5)
Glucose: 100 mg/dL — ABNORMAL HIGH (ref 65–99)
Potassium: 4.4 mmol/L (ref 3.5–5.2)
Sodium: 137 mmol/L (ref 134–144)
Total Protein: 6.4 g/dL (ref 6.0–8.5)

## 2018-08-16 ENCOUNTER — Ambulatory Visit: Payer: Medicare Other | Admitting: Neurology

## 2018-08-16 ENCOUNTER — Ambulatory Visit: Payer: Medicare Other | Admitting: Nurse Practitioner

## 2018-08-17 ENCOUNTER — Telehealth: Payer: Self-pay | Admitting: *Deleted

## 2018-08-17 DIAGNOSIS — R945 Abnormal results of liver function studies: Principal | ICD-10-CM

## 2018-08-17 DIAGNOSIS — R7989 Other specified abnormal findings of blood chemistry: Secondary | ICD-10-CM

## 2018-08-17 NOTE — Telephone Encounter (Signed)
Spoke with patient regarding echo and labs. Mailed lab order forms. When I went to fill Lasix pterional issue came up with allergy of Celecoxib. Message sent to Pharm  D to see if ok to fill

## 2018-08-17 NOTE — Telephone Encounter (Signed)
-----   Message from Skeet Latch, MD sent at 08/15/2018 12:54 PM EDT ----- Cholesterol levels are excellent.  Normal kidney function and electrolytes.  Liver function very mildly elevated.  Repeat LFTs in 1 month.

## 2018-08-17 NOTE — Telephone Encounter (Signed)
-----   Message from Skeet Latch, MD sent at 08/15/2018 12:57 PM EDT ----- Echo shows that her heart squeezes well but doesn't relax completely.  Lets try switching HCTZ to lasix 40mg  daily to see if that helps her shortness of breath.

## 2018-08-19 ENCOUNTER — Other Ambulatory Visit: Payer: Self-pay | Admitting: Family Medicine

## 2018-08-20 ENCOUNTER — Inpatient Hospital Stay: Payer: Medicare Other | Attending: Oncology | Admitting: Nurse Practitioner

## 2018-08-20 ENCOUNTER — Ambulatory Visit: Payer: Medicare Other | Admitting: Radiation Oncology

## 2018-08-20 ENCOUNTER — Other Ambulatory Visit: Payer: Self-pay

## 2018-08-20 ENCOUNTER — Encounter: Payer: Self-pay | Admitting: Nurse Practitioner

## 2018-08-20 VITALS — BP 132/74 | HR 92 | Temp 98.4°F | Resp 18 | Ht 61.0 in | Wt 156.7 lb

## 2018-08-20 DIAGNOSIS — D696 Thrombocytopenia, unspecified: Secondary | ICD-10-CM | POA: Insufficient documentation

## 2018-08-20 DIAGNOSIS — R51 Headache: Secondary | ICD-10-CM | POA: Diagnosis not present

## 2018-08-20 DIAGNOSIS — G893 Neoplasm related pain (acute) (chronic): Secondary | ICD-10-CM | POA: Diagnosis not present

## 2018-08-20 DIAGNOSIS — C250 Malignant neoplasm of head of pancreas: Secondary | ICD-10-CM | POA: Insufficient documentation

## 2018-08-20 NOTE — Progress Notes (Addendum)
  [facility] OFFICE PROGRESS NOTE   Diagnosis: Pancreas cancer  INTERVAL HISTORY:   [name] returns as scheduled.  She is scheduled to begin SBRT tomorrow.  She continues to have pain mainly located at the mid to low back.  She takes hydrocodone as needed.  She reports a headache as well.  No visual disturbance.  Objective:  Vital signs in last 24 hours:  Blood pressure 132/74, pulse 92, temperature 98.4 F (36.9 C), temperature source Oral, resp. rate 18, height 5\' 1"  (1.549 m), weight 156 lb 11.2 oz (71.1 kg), SpO2 99 %.    Alert and and oriented.  Follows commands.  No rash at the mid to low back.  Lab Results:  Lab Results  Component Value Date   WBC 6.9 07/20/2018   HGB 12.9 07/20/2018   HCT 39.8 07/20/2018   MCV 99.3 07/20/2018   PLT 122 (L) 07/20/2018   NEUTROABS 4.9 07/20/2018    Imaging:  No results found.  Medications: I have reviewed the patient's current medications.  Assessment/Plan: 1. 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  CT abdomen/pelvis 01/19/2015 with no evidence of recurrent/metastatic disease.  CT abdomen/pelvis 07/20/2018- infiltrating soft tissue at the pancreas resection bed with involvement of the SMA, occlusion of the SMV with collaterals.  New L2 compression fracture  SBRT beginning 08/21/2018  2. Nausea following the Whipple procedure-improved 3. Mild thrombocytopenia , Chronic 4.  Pain secondary to local recurrence of pancreas cancer  Disposition: [name] appears unchanged.  She appears to have local recurrence of tumor involving the pancreas resection bed.  She is  symptomatic with pain.  She is scheduled to begin SBRT 08/21/2018.  For the pain she will continue hydrocodone as needed.  She understands to contact the office if this is not effective.  We plan to see her back a few weeks after completing the course of SBRT.  She and her son understand to contact the office in the interim with any problems.  Patient seen with Dr. [name].    Ned Card ANP/GNP-BC   08/20/2018  11:48 AM   This was a shared visit with Ned Card.  [name] continues to have abdomen and back pain.  She is scheduled to begin SBRT this week.  She will continue hydrocodone as needed for pain.  We will see her following completion of the SBRT.  She will contact us in the interim as needed.  Julieanne Manson, MD

## 2018-08-21 ENCOUNTER — Other Ambulatory Visit: Payer: Self-pay

## 2018-08-21 ENCOUNTER — Telehealth: Payer: Self-pay | Admitting: Oncology

## 2018-08-21 ENCOUNTER — Ambulatory Visit
Admission: RE | Admit: 2018-08-21 | Discharge: 2018-08-21 | Disposition: A | Discharge status: Home / Self Care | Payer: Medicare Other | Source: Ambulatory Visit | Attending: Radiation Oncology | Admitting: Radiation Oncology

## 2018-08-21 DIAGNOSIS — I10 Essential (primary) hypertension: Secondary | ICD-10-CM | POA: Diagnosis not present

## 2018-08-21 DIAGNOSIS — Z510 Encounter for antineoplastic radiation therapy: Secondary | ICD-10-CM | POA: Diagnosis not present

## 2018-08-21 DIAGNOSIS — C250 Malignant neoplasm of head of pancreas: Secondary | ICD-10-CM | POA: Diagnosis not present

## 2018-08-21 DIAGNOSIS — Z803 Family history of malignant neoplasm of breast: Secondary | ICD-10-CM | POA: Diagnosis not present

## 2018-08-21 DIAGNOSIS — F329 Major depressive disorder, single episode, unspecified: Secondary | ICD-10-CM | POA: Diagnosis not present

## 2018-08-21 DIAGNOSIS — K5900 Constipation, unspecified: Secondary | ICD-10-CM | POA: Diagnosis not present

## 2018-08-21 NOTE — Telephone Encounter (Signed)
Spoke with patient re 4/24 f/u.

## 2018-08-22 ENCOUNTER — Ambulatory Visit: Payer: Medicare Other | Admitting: Radiation Oncology

## 2018-08-22 MED ORDER — FUROSEMIDE 40 MG PO TABS: ORAL_TABLET | ORAL | 3 refills | Status: DC

## 2018-08-22 NOTE — Telephone Encounter (Signed)
Discussed potential effect of the Lasix with Alena Bills D ok for patient to take, had N&V with Sulfa but tolerates HCTZ ok. Ok to start Lasix 20 mg daily for 3 days and then increase to 40 mg daily  Advised patient, verbalized understanding.

## 2018-08-23 ENCOUNTER — Other Ambulatory Visit: Payer: Self-pay

## 2018-08-23 ENCOUNTER — Ambulatory Visit: Payer: Medicare Other | Admitting: Radiation Oncology

## 2018-08-23 ENCOUNTER — Ambulatory Visit
Admission: RE | Admit: 2018-08-23 | Discharge: 2018-08-23 | Disposition: A | Discharge status: Home / Self Care | Payer: Medicare Other | Source: Ambulatory Visit | Attending: Radiation Oncology | Admitting: Radiation Oncology

## 2018-08-23 DIAGNOSIS — Z510 Encounter for antineoplastic radiation therapy: Secondary | ICD-10-CM | POA: Diagnosis not present

## 2018-08-23 DIAGNOSIS — K5900 Constipation, unspecified: Secondary | ICD-10-CM | POA: Diagnosis not present

## 2018-08-23 DIAGNOSIS — Z803 Family history of malignant neoplasm of breast: Secondary | ICD-10-CM | POA: Diagnosis not present

## 2018-08-23 DIAGNOSIS — F329 Major depressive disorder, single episode, unspecified: Secondary | ICD-10-CM | POA: Diagnosis not present

## 2018-08-23 DIAGNOSIS — C250 Malignant neoplasm of head of pancreas: Secondary | ICD-10-CM | POA: Diagnosis not present

## 2018-08-23 DIAGNOSIS — I10 Essential (primary) hypertension: Secondary | ICD-10-CM | POA: Diagnosis not present

## 2018-08-27 ENCOUNTER — Ambulatory Visit
Admission: RE | Admit: 2018-08-27 | Discharge: 2018-08-27 | Disposition: A | Discharge status: Home / Self Care | Payer: Medicare Other | Source: Ambulatory Visit | Attending: Radiation Oncology | Admitting: Radiation Oncology

## 2018-08-27 ENCOUNTER — Other Ambulatory Visit: Payer: Self-pay

## 2018-08-27 DIAGNOSIS — C250 Malignant neoplasm of head of pancreas: Secondary | ICD-10-CM | POA: Diagnosis not present

## 2018-08-27 DIAGNOSIS — K5900 Constipation, unspecified: Secondary | ICD-10-CM | POA: Diagnosis not present

## 2018-08-27 DIAGNOSIS — Z510 Encounter for antineoplastic radiation therapy: Secondary | ICD-10-CM | POA: Diagnosis not present

## 2018-08-27 DIAGNOSIS — Z803 Family history of malignant neoplasm of breast: Secondary | ICD-10-CM | POA: Diagnosis not present

## 2018-08-27 DIAGNOSIS — I10 Essential (primary) hypertension: Secondary | ICD-10-CM | POA: Diagnosis not present

## 2018-08-27 DIAGNOSIS — F329 Major depressive disorder, single episode, unspecified: Secondary | ICD-10-CM | POA: Diagnosis not present

## 2018-08-29 ENCOUNTER — Ambulatory Visit
Admission: RE | Admit: 2018-08-29 | Discharge: 2018-08-29 | Disposition: A | Discharge status: Home / Self Care | Payer: Medicare Other | Source: Ambulatory Visit | Attending: Radiation Oncology | Admitting: Radiation Oncology

## 2018-08-29 ENCOUNTER — Other Ambulatory Visit: Payer: Self-pay

## 2018-08-29 DIAGNOSIS — Z79890 Hormone replacement therapy (postmenopausal): Secondary | ICD-10-CM | POA: Insufficient documentation

## 2018-08-29 DIAGNOSIS — Z96652 Presence of left artificial knee joint: Secondary | ICD-10-CM | POA: Diagnosis not present

## 2018-08-29 DIAGNOSIS — Z510 Encounter for antineoplastic radiation therapy: Secondary | ICD-10-CM | POA: Insufficient documentation

## 2018-08-29 DIAGNOSIS — K5900 Constipation, unspecified: Secondary | ICD-10-CM | POA: Diagnosis not present

## 2018-08-29 DIAGNOSIS — F329 Major depressive disorder, single episode, unspecified: Secondary | ICD-10-CM | POA: Diagnosis not present

## 2018-08-29 DIAGNOSIS — Z803 Family history of malignant neoplasm of breast: Secondary | ICD-10-CM | POA: Insufficient documentation

## 2018-08-29 DIAGNOSIS — G629 Polyneuropathy, unspecified: Secondary | ICD-10-CM | POA: Insufficient documentation

## 2018-08-29 DIAGNOSIS — Z79899 Other long term (current) drug therapy: Secondary | ICD-10-CM | POA: Insufficient documentation

## 2018-08-29 DIAGNOSIS — Z8673 Personal history of transient ischemic attack (TIA), and cerebral infarction without residual deficits: Secondary | ICD-10-CM | POA: Insufficient documentation

## 2018-08-29 DIAGNOSIS — I10 Essential (primary) hypertension: Secondary | ICD-10-CM | POA: Insufficient documentation

## 2018-08-29 DIAGNOSIS — E039 Hypothyroidism, unspecified: Secondary | ICD-10-CM | POA: Insufficient documentation

## 2018-08-29 DIAGNOSIS — C250 Malignant neoplasm of head of pancreas: Secondary | ICD-10-CM | POA: Diagnosis not present

## 2018-08-31 ENCOUNTER — Encounter: Payer: Self-pay | Admitting: Radiation Oncology

## 2018-08-31 ENCOUNTER — Other Ambulatory Visit: Payer: Self-pay

## 2018-08-31 ENCOUNTER — Ambulatory Visit
Admission: RE | Admit: 2018-08-31 | Discharge: 2018-08-31 | Disposition: A | Discharge status: Home / Self Care | Payer: Medicare Other | Source: Ambulatory Visit | Attending: Radiation Oncology | Admitting: Radiation Oncology

## 2018-08-31 DIAGNOSIS — Z803 Family history of malignant neoplasm of breast: Secondary | ICD-10-CM | POA: Diagnosis not present

## 2018-08-31 DIAGNOSIS — K5900 Constipation, unspecified: Secondary | ICD-10-CM | POA: Diagnosis not present

## 2018-08-31 DIAGNOSIS — C250 Malignant neoplasm of head of pancreas: Secondary | ICD-10-CM | POA: Diagnosis not present

## 2018-08-31 DIAGNOSIS — Z510 Encounter for antineoplastic radiation therapy: Secondary | ICD-10-CM | POA: Diagnosis not present

## 2018-08-31 DIAGNOSIS — F329 Major depressive disorder, single episode, unspecified: Secondary | ICD-10-CM | POA: Diagnosis not present

## 2018-08-31 DIAGNOSIS — I10 Essential (primary) hypertension: Secondary | ICD-10-CM | POA: Diagnosis not present

## 2018-09-03 ENCOUNTER — Telehealth: Payer: Self-pay | Admitting: Neurology

## 2018-09-03 ENCOUNTER — Other Ambulatory Visit: Payer: Self-pay | Admitting: Family Medicine

## 2018-09-03 ENCOUNTER — Other Ambulatory Visit: Payer: Self-pay | Admitting: Neurology

## 2018-09-03 NOTE — Telephone Encounter (Signed)
I called the patient to schedule a virtual or telephone visit on April 9.  She reports she would like to reschedule until she can come in.  She also mentioned she would like to see Dr. Jannifer Franklin.  I will get her set up for a revisit with Dr. Jannifer Franklin in a few weeks.

## 2018-09-03 NOTE — Telephone Encounter (Signed)
Spoke with patient, she will call back when her son can schedule the appt, he is her transportation.

## 2018-09-04 ENCOUNTER — Other Ambulatory Visit: Payer: Self-pay

## 2018-09-04 MED ORDER — PREGABALIN 300 MG PO CAPS: 300.0000 mg | ORAL_CAPSULE | Freq: Two times a day (BID) | ORAL | 1 refills | Status: DC

## 2018-09-04 NOTE — Telephone Encounter (Signed)
Received a paper refill request from [facility] for Lyrica 300mg  2 capsules daily Qty: 180.   Sent to Dr. Jannifer Franklin to approve or deny.

## 2018-09-05 ENCOUNTER — Telehealth: Payer: Self-pay | Admitting: *Deleted

## 2018-09-05 ENCOUNTER — Telehealth: Payer: Self-pay | Admitting: Neurology

## 2018-09-05 ENCOUNTER — Telehealth: Payer: Self-pay | Admitting: Radiation Oncology

## 2018-09-05 MED ORDER — ONDANSETRON HCL 8 MG PO TABS: 8.0000 mg | ORAL_TABLET | Freq: Three times a day (TID) | ORAL | 1 refills | Status: AC

## 2018-09-05 NOTE — Telephone Encounter (Signed)
The patient's son called and stated his mom was still having nausea and is taking zofran 4 mg prn.w e discussed switching to 8 mg and I've sent in a new rx. She is having fatigue as well. We discussed light exercises to try to help with this. She is due to see Dr. [name] on 4/24 and her son is curious about how to proceed. He would prefer that he speak with Dr. [name] instead of having face to face visit due to risks of coronavirus. I'll reach out to Dr. Carin Hock nurse.

## 2018-09-05 NOTE — Telephone Encounter (Signed)
Sent new RX for patient [facility] for [place] telephone [phone] - fax [phone]

## 2018-09-05 NOTE — Telephone Encounter (Addendum)
Called son,per his request. Patient is having intermittent diarrhea, more nausea and extreme fatigue. Spends most of the day on the sofa. Too fatigued to walk to mailbox anymore. Her last RT was 08/31/18 and PA increased her Zofran to 8 mg prn. Was told this was most likely all related to her RT and wants to confirm this and how to tell if it is that or progression of her cancer. Informed him that these symptoms are most likely related to her treatment and should improve over next few weeks.Encouraged him to have her take Imodium prn and to be sure she is not taking her Senna-S. He prefers to have a telephone visit with Dr. [name] on 09/21/18-can call patient and he will be there as well. MD notified. Per Dr. [name]: Agrees that symptoms above are most likely related to the RT and should improve over the next few weeks.

## 2018-09-05 NOTE — Telephone Encounter (Signed)
Noted, thanks!

## 2018-09-06 ENCOUNTER — Ambulatory Visit: Payer: Self-pay | Admitting: Neurology

## 2018-09-21 ENCOUNTER — Telehealth: Payer: Self-pay | Admitting: Oncology

## 2018-09-21 ENCOUNTER — Inpatient Hospital Stay: Payer: Medicare Other | Attending: Oncology | Admitting: Oncology

## 2018-09-21 DIAGNOSIS — C250 Malignant neoplasm of head of pancreas: Secondary | ICD-10-CM

## 2018-09-21 MED ORDER — HYDROCODONE-ACETAMINOPHEN 5-325 MG PO TABS: 1.0000 | ORAL_TABLET | Freq: Four times a day (QID) | ORAL | 0 refills | Status: DC | PRN

## 2018-09-21 NOTE — Progress Notes (Signed)
[place] OFFICE VISIT PROGRESS NOTE  I connected with [name] on 09/21/18 at 10:39 AM EDT by telephone and verified that I am speaking with the correct person using two identifiers.    Other persons participating in the visit and their role in the encounter: Son  Patient's location: Home Provider's location: Office    Diagnosis: Pancreas cancer  INTERVAL HISTORY:   [name] presents today for a telephone visit instead of an in person visit.  This is due to the [place] pandemic.  Her son is also present on the telephone. [name] completed SBRT 08/31/2018.  She reports tolerating the radiation well.  She developed nausea and mild diarrhea beginning after the final radiation treatment.  This has persisted.  She has diarrhea once or twice daily.  The diarrhea improves with Imodium. She has pain in the mid and low abdomen to the right of midline.  She also has pain at the lower back.  She takes hydrocodone approximately once daily.  The pain she experienced prior to beginning radiation has not improved.  [name] reports a good appetite.  No fever. She had a presyncope event while in the kitchen yesterday.  She fell to her knees.  She reports her blood pressure reading is 139/83 at home today.   Lab Results:  Lab Results  Component Value Date   WBC 6.9 07/20/2018   HGB 12.9 07/20/2018   HCT 39.8 07/20/2018   MCV 99.3 07/20/2018   PLT 122 (L) 07/20/2018   NEUTROABS 4.9 07/20/2018    Medications: I have reviewed the patient's current medications.  Assessment/Plan: 1. 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  CT abdomen/pelvis 01/19/2015 with no evidence of recurrent/metastatic disease.  CT abdomen/pelvis 07/20/2018- infiltrating soft tissue at the pancreas resection bed with involvement of the SMA, occlusion of the SMV with collaterals. New L2 compression fracture  SBRT beginning 08/21/2018, completed 08/31/2018, 5 fractions  2. Nausea following the Whipple procedure-improved 3. Mild thrombocytopenia , Chronic 4.Pain secondary to local recurrence of pancreas cancer    Disposition: [name] has a history of local recurrence of pancreas cancer.  She completed a course of SBRT to soft tissue at the pancreas resection bed on 08/31/2018.  The nausea and diarrhea may be a complication of radiation.  If so this should improve over the next few weeks.  She will call for increased diarrhea. She continues to have abdomen/back pain, likely related to the retroperitoneal tumor recurrence.  She will continue hydrocodone as needed.  She will let us know if the hydrocodone does not help.  I refilled her prescription for hydrocodone.  The etiology of the presyncope event yesterday is unclear.  I recommended she monitor her blood pressure and if the systolic blood pressure is running less than 110 she should contact [name].  [name] will return for an office visit on 11/02/2018.  She will contact us in the interim as needed.   I discussed the assessment and treatment plan with the patient. The patient was provided an opportunity to ask questions and all were answered. The patient agreed with the plan and demonstrated an understanding of the instructions.  I provided  minutes of  23 minutes of telephone and documentation time  during this encounter, and > 50% was spent counseling as documented under my assessment & plan.  Betsy Coder ANP/GNP-BC   09/21/2018 9:57 AM

## 2018-09-21 NOTE — Telephone Encounter (Signed)
Scheduled appt per 4/24 los. ° °A calendar will be mailed out. °

## 2018-09-25 ENCOUNTER — Telehealth: Payer: Self-pay | Admitting: Radiation Oncology

## 2018-09-25 NOTE — Telephone Encounter (Addendum)
I called the patient and she's still having nausea and diarrhea. I recommended that she increase her zofran to TID rather than prn. She has stopped stool softener and is taking imodium. She still feels like she's having diarrhea. She didn't take any medication yesterday and today she's back to diarrhea. I encouraged scheduled imodium as well. She will try this an let us know if she has any other concerns moving forward.

## 2018-09-27 ENCOUNTER — Other Ambulatory Visit: Payer: Self-pay | Admitting: Cardiovascular Disease

## 2018-09-27 NOTE — Telephone Encounter (Signed)
Lisinopril refilled.

## 2018-09-27 NOTE — Progress Notes (Signed)
  Radiation Oncology         [phone]) [phone] ________________________________  Name: [name] MRN: [id number]  Date: 08/31/2018  DOB: 1941/04/27  End of Treatment Note  Diagnosis:   78 y.o. female with Locally recurrent adenocarcinoma of the pancreas  Indication for treatment:  Curative       Radiation treatment dates:   08/21/2018, 08/23/2018, 08/27/2018, 08/29/2018, 08/31/2018  Site/dose:   The tumor in the pancreas was treated with a course of stereotactic body radiation treatment. The patient received 33 Gy in 5 fractions at 6.6 Gy per fraction.  Beams/energy:   SBRT/SRT-3D // 6X-FFF Photon  Narrative: The patient tolerated radiation treatment relatively well.   She did experience some nausea and diarrhea that she is managing adequately with medication at this time. She also noted mild fatigue.  Plan: The patient has completed radiation treatment. The patient will return to radiation oncology clinic for routine followup in one month. I advised the patient to call or return sooner if they have any questions or concerns related to their recovery or treatment.   ------------------------------------------------  Jodelle Gross, MD, PhD  This document serves as a record of services personally performed by Kyung Rudd, MD. It was created on his behalf by Rae Lips, a trained medical scribe. The creation of this record is based on the scribe's personal observations and the provider's statements to them. This document has been checked and approved by the attending provider.

## 2018-09-27 NOTE — Progress Notes (Signed)
[facility] Radiation [facility] Simulation and Treatment Planning Note   Name:  TIEA MANNINEN MRN: [id number]   Date: 09/27/2018  DOB: 16-Apr-1941  Status:outpatient    DIAGNOSIS:    ICD-10-CM   1. Carcinoma of head of pancreas ([place]) C25.0      CONSENT VERIFIED:yes   SET UP: Patient is setup supine   IMMOBILIZATION: The patient was immobilized using a Vac Loc bag and customized accuform device  NARRATIVE:The patient was brought to the [facility].  Identity was confirmed.  All relevant records and images related to the planned course of therapy were reviewed.  Then, the patient was positioned in a stable reproducible clinical set-up for radiation therapy. Abdominal compression was applied.  4D CT images were obtained and reproducible breathing pattern was confirmed. Free breathing CT images were obtained.  Skin markings were placed.  The CT images were loaded into the planning software where the target and avoidance structures were contoured.  The radiation prescription was entered and confirmed.    TREATMENT PLANNING NOTE:  Treatment planning then occurred. I have requested : MLC's, isodose plan, basic dose calculation.  3 dimensional simulation is performed and dose volume histogram of the gross tumor volume, planning tumor volume and criticial normal structures including the spinal cord and lungs were analyzed and requested.  Special treatment procedure was performed due to high dose per fraction.  The patient will be monitored for increased risk of toxicity.  Daily imaging using cone beam CT will be used for target localization.  I anticipate that the patient will receive 33 Gy in 5 fractions to target volume. Further adjustments will be made based on the planning process is necessary.  ------------------------------------------------  Jodelle Gross, MD, PhD

## 2018-10-09 ENCOUNTER — Telehealth: Payer: Self-pay | Admitting: *Deleted

## 2018-10-09 NOTE — Telephone Encounter (Signed)
Called to report she has been having upper and lower abdominal cramping for 2 days. Bowels move 2-3/day and look normal. Still has mild nausea that her Zofran helps. Takes her hydrocodone 1-2/day and it will ease pain briefly. Says this pain is a new pain. Does not think she has any abdominal swelling and denies fever.

## 2018-10-10 NOTE — Telephone Encounter (Signed)
Per Dr. [name]: Call back if pain persists beyond a few days. Notified patient and she reports it is not as severe today and she also had two diarrhea stools today. Suggested if diarrhea returns to take an Imodium AD--this may help the cramping as well. She will call on Friday if not better.

## 2018-10-28 ENCOUNTER — Other Ambulatory Visit: Payer: Self-pay | Admitting: Family Medicine

## 2018-10-31 NOTE — Telephone Encounter (Signed)
Last OV 07/19/18 Last refill 08/02/18 #180/0 Next OV not scheduled

## 2018-11-04 ENCOUNTER — Other Ambulatory Visit: Payer: Self-pay | Admitting: Cardiovascular Disease

## 2018-11-05 ENCOUNTER — Telehealth: Payer: Self-pay | Admitting: Oncology

## 2018-11-05 ENCOUNTER — Other Ambulatory Visit: Payer: Self-pay

## 2018-11-05 ENCOUNTER — Inpatient Hospital Stay: Payer: Medicare Other | Attending: Oncology | Admitting: Oncology

## 2018-11-05 VITALS — BP 146/74 | HR 84 | Temp 98.7°F | Resp 16 | Ht 61.0 in | Wt 155.0 lb

## 2018-11-05 DIAGNOSIS — G893 Neoplasm related pain (acute) (chronic): Secondary | ICD-10-CM | POA: Diagnosis not present

## 2018-11-05 DIAGNOSIS — C250 Malignant neoplasm of head of pancreas: Secondary | ICD-10-CM | POA: Insufficient documentation

## 2018-11-05 DIAGNOSIS — R110 Nausea: Secondary | ICD-10-CM | POA: Diagnosis not present

## 2018-11-05 DIAGNOSIS — D696 Thrombocytopenia, unspecified: Secondary | ICD-10-CM | POA: Diagnosis not present

## 2018-11-05 MED ORDER — HYDROCODONE-ACETAMINOPHEN 5-325 MG PO TABS: 1.0000 | ORAL_TABLET | Freq: Four times a day (QID) | ORAL | 0 refills | Status: DC | PRN

## 2018-11-05 NOTE — Progress Notes (Signed)
  [facility] OFFICE PROGRESS NOTE   Diagnosis: Pancreas cancer  INTERVAL HISTORY:   Ms. Gathright returns as scheduled.  She reports pain in the lower and left back that is also present in the left lateral abdominal wall and abdomen.  She takes hydrocodone approximately once daily for pain. Good appetite.  She reports constipation relieved with a stool softener and fiber. Objective:  Vital signs in last 24 hours:  Blood pressure (!) 146/74, pulse 84, temperature 98.7 F (37.1 C), temperature source Oral, resp. rate 16, height 5\' 1"  (1.549 m), weight 154 lb 15.7 oz (70.3 kg), SpO2 95 %.     GI: No hepatomegaly, no mass, mild tenderness in the left greater than right abdomen Vascular: Trace lower leg edema bilaterally Musculoskeletal: Mild tenderness of the low back and bilateral posterior iliac    Lab Results:  Lab Results  Component Value Date   WBC 6.9 07/20/2018   HGB 12.9 07/20/2018   HCT 39.8 07/20/2018   MCV 99.3 07/20/2018   PLT 122 (L) 07/20/2018   NEUTROABS 4.9 07/20/2018    CMP  Lab Results  Component Value Date   NA 137 08/14/2018   K 4.4 08/14/2018   CL 96 08/14/2018   CO2 25 08/14/2018   GLUCOSE 100 (H) 08/14/2018   BUN 17 08/14/2018   CREATININE 0.95 08/14/2018   CALCIUM 9.2 08/14/2018   PROT 6.4 08/14/2018   ALBUMIN 4.4 08/14/2018   AST 30 08/14/2018   ALT 41 (H) 08/14/2018   ALKPHOS 172 (H) 08/14/2018   BILITOT 0.4 08/14/2018   GFRNONAA 58 (L) 08/14/2018   GFRAA 66 08/14/2018    Lab Results  Component Value Date   CEA1 5.54 (H) 07/27/2018     Medications: I have reviewed the patient's current medications.   Assessment/Plan:  1. 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  CT abdomen/pelvis 01/19/2015 with no evidence of recurrent/metastatic disease.  CT abdomen/pelvis 07/20/2018- infiltrating soft tissue at the pancreas resection bed with involvement of the SMA, occlusion of the SMV with collaterals. New L2 compression fracture  SBRT beginning 08/21/2018, completed 08/31/2018, 5 fractions  2. Nausea following the Whipple procedure-improved 3. Mild thrombocytopenia , Chronic 4.Pain secondary to local recurrence of pancreas cancer     Disposition: Ms. Servais appears unchanged.  She continues to have back and abdominal pain.  Is unclear whether the pain is related to a benign musculoskeletal condition or local recurrence of pancreas cancer.  She has not experienced improvement in the pain following radiation.  She will continue hydrocodone as needed for pain.  She will undergo a restaging CT prior to an office visit in approximately 1 month.  Betsy Coder, MD  11/05/2018  10:58 AM

## 2018-11-05 NOTE — Telephone Encounter (Signed)
Scheduled appt per 6/8 los. °

## 2018-11-15 ENCOUNTER — Other Ambulatory Visit: Payer: Self-pay | Admitting: Cardiovascular Disease
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
## 2018-11-28 ENCOUNTER — Encounter: Payer: Self-pay | Admitting: Oncology
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
## 2018-12-03 ENCOUNTER — Other Ambulatory Visit: Payer: Self-pay

## 2018-12-03 ENCOUNTER — Inpatient Hospital Stay: Payer: Medicare Other | Attending: Oncology

## 2018-12-03 ENCOUNTER — Encounter (HOSPITAL_COMMUNITY): Payer: Self-pay

## 2018-12-03 ENCOUNTER — Ambulatory Visit (HOSPITAL_COMMUNITY)
Admission: RE | Admit: 2018-12-03 | Discharge: 2018-12-03 | Disposition: A | Discharge status: Home / Self Care | Payer: Medicare Other | Source: Ambulatory Visit | Attending: Oncology | Admitting: Oncology

## 2018-12-03 DIAGNOSIS — K760 Fatty (change of) liver, not elsewhere classified: Secondary | ICD-10-CM | POA: Diagnosis not present

## 2018-12-03 DIAGNOSIS — C259 Malignant neoplasm of pancreas, unspecified: Secondary | ICD-10-CM | POA: Diagnosis not present

## 2018-12-03 DIAGNOSIS — C250 Malignant neoplasm of head of pancreas: Secondary | ICD-10-CM | POA: Insufficient documentation

## 2018-12-03 DIAGNOSIS — K439 Ventral hernia without obstruction or gangrene: Secondary | ICD-10-CM | POA: Diagnosis not present

## 2018-12-03 DIAGNOSIS — M545 Low back pain: Secondary | ICD-10-CM | POA: Insufficient documentation

## 2018-12-03 DIAGNOSIS — R109 Unspecified abdominal pain: Secondary | ICD-10-CM | POA: Insufficient documentation

## 2018-12-03 DIAGNOSIS — M7989 Other specified soft tissue disorders: Secondary | ICD-10-CM | POA: Diagnosis not present

## 2018-12-03 DIAGNOSIS — D696 Thrombocytopenia, unspecified: Secondary | ICD-10-CM | POA: Insufficient documentation

## 2018-12-03 LAB — BASIC METABOLIC PANEL - CANCER CENTER ONLY
Anion gap: 15 (ref 5–15)
BUN: 12 mg/dL (ref 8–23)
CO2: 28 mmol/L (ref 22–32)
Calcium: 10.1 mg/dL (ref 8.9–10.3)
Chloride: 96 mmol/L — ABNORMAL LOW (ref 98–111)
Creatinine: 0.93 mg/dL (ref 0.44–1.00)
GFR, Est AFR Am: >60 mL/min (ref >60)
GFR, Estimated: 58 mL/min — ABNORMAL LOW (ref >60)
Glucose, Bld: 114 mg/dL — ABNORMAL HIGH (ref 70–99)
Potassium: 4.1 mmol/L (ref 3.5–5.1)
Sodium: 139 mmol/L (ref 135–145)

## 2018-12-03 MED ORDER — IOHEXOL 300 MG/ML  SOLN
100.0000 mL | Freq: Once | INTRAMUSCULAR | Status: AC | PRN
  Administered 2018-12-03: 10:00:00 100 mL via INTRAVENOUS

## 2018-12-03 MED ORDER — SODIUM CHLORIDE (PF) 0.9 % IJ SOLN
INTRAMUSCULAR | Status: AC
  Filled 2018-12-03: qty 50

## 2018-12-04 LAB — CANCER ANTIGEN 19-9: CA 19-9: 1 U/mL (ref 0–35)

## 2018-12-05 ENCOUNTER — Inpatient Hospital Stay (HOSPITAL_BASED_OUTPATIENT_CLINIC_OR_DEPARTMENT_OTHER): Payer: Medicare Other | Admitting: Oncology

## 2018-12-05 ENCOUNTER — Other Ambulatory Visit: Payer: Self-pay

## 2018-12-05 ENCOUNTER — Telehealth: Payer: Self-pay | Admitting: Oncology

## 2018-12-05 VITALS — BP 114/56 | HR 92 | Temp 98.7°F | Resp 18 | Ht 61.0 in | Wt 150.2 lb

## 2018-12-05 DIAGNOSIS — M7989 Other specified soft tissue disorders: Secondary | ICD-10-CM

## 2018-12-05 DIAGNOSIS — D696 Thrombocytopenia, unspecified: Secondary | ICD-10-CM

## 2018-12-05 DIAGNOSIS — R109 Unspecified abdominal pain: Secondary | ICD-10-CM | POA: Diagnosis not present

## 2018-12-05 DIAGNOSIS — C250 Malignant neoplasm of head of pancreas: Secondary | ICD-10-CM

## 2018-12-05 DIAGNOSIS — M545 Low back pain: Secondary | ICD-10-CM | POA: Diagnosis not present

## 2018-12-05 DIAGNOSIS — K760 Fatty (change of) liver, not elsewhere classified: Secondary | ICD-10-CM | POA: Diagnosis not present

## 2018-12-05 DIAGNOSIS — K439 Ventral hernia without obstruction or gangrene: Secondary | ICD-10-CM | POA: Diagnosis not present

## 2018-12-05 NOTE — Progress Notes (Signed)
Reports both sons have offered her to live with them.

## 2018-12-05 NOTE — Telephone Encounter (Signed)
Gave avs and calendar ° °

## 2018-12-05 NOTE — Progress Notes (Signed)
[name]   Diagnosis: Pancreas cancer  INTERVAL HISTORY:   [name] returns as scheduled.  She continues to have intermittent low back and abdomen pain.  She takes hydrocodone, but not on a daily basis.  She has occasional diarrhea.  Good appetite. She had an episode of leg weakness while working in her kitchen yesterday.  Objective:  Vital signs in last 24 hours:  Blood pressure (!) 114/56, pulse 92, temperature 98.7 F (37.1 C), temperature source Oral, resp. rate 18, height 5\' 1"  (1.549 m), weight 150 lb 3.2 oz (68.1 kg).    Limited physical examination secondary to distancing with the COVID pandemic GI: No hepatosplenomegaly, mild diffuse tenderness, no mass Vascular: No leg edema Neuro: The leg strength is intact bilaterally Musculoskeletal: The area of pain is at the lower lumbar/upper sacrum, mild tenderness at the right greater than left lower posterior lateral ribs Skin: 1 cm benign-appearing mole of the right anterior thigh with scaling    Lab Results:  Lab Results  Component Value Date   WBC 6.9 07/20/2018   HGB 12.9 07/20/2018   HCT 39.8 07/20/2018   MCV 99.3 07/20/2018   PLT 122 (L) 07/20/2018   NEUTROABS 4.9 07/20/2018    CMP  Lab Results  Component Value Date   NA 139 12/03/2018   K 4.1 12/03/2018   CL 96 (L) 12/03/2018   CO2 28 12/03/2018   GLUCOSE 114 (H) 12/03/2018   BUN 12 12/03/2018   CREATININE 0.93 12/03/2018   CALCIUM 10.1 12/03/2018   PROT 6.4 08/14/2018   ALBUMIN 4.4 08/14/2018   AST 30 08/14/2018   ALT 41 (H) 08/14/2018   ALKPHOS 172 (H) 08/14/2018   BILITOT 0.4 08/14/2018   GFRNONAA 58 (L) 12/03/2018   GFRAA >60 12/03/2018    Lab Results  Component Value Date   CEA1 5.54 (H) 07/27/2018    Lab Results  Component Value Date   INR 1.28 08/08/2013    Imaging:  Ct Abdomen Pelvis W Contrast  Result Date: 12/03/2018 CLINICAL DATA:  PANCREATIC CANCER. EXAM: CT ABDOMEN AND PELVIS WITH  CONTRAST TECHNIQUE: Multidetector CT imaging of the abdomen and pelvis was performed using the standard protocol following bolus administration of intravenous contrast. CONTRAST:  174mL OMNIPAQUE IOHEXOL 300 MG/ML  SOLN COMPARISON:  07/20/2018 FINDINGS: Lower chest: Coronary artery calcification is evident. Areas of subsegmental atelectasis noted right lung base. Hepatobiliary: The liver shows diffusely decreased attenuation suggesting fat deposition. Fatty sparing noted posterior segment IV pneumobilia compatible with prior Whipple procedure. Pancreas: Status post Whipple. Pancreatic body and tail are unremarkable without main duct dilatation. Abnormal soft tissue along the SMA is again noted measuring approximately 1.9 x 1.5 cm, not substantially changed in the interval. As before, this occludes the SMV and encases the SMA. Portal vein and splenic vein remain patent. Spleen: No splenomegaly. No focal mass lesion. Adrenals/Urinary Tract: No adrenal nodule or mass. Kidneys unremarkable. No evidence for hydroureter. The urinary bladder appears normal for the degree of distention. Stomach/Bowel: Postsurgical changes noted in the stomach. No small bowel wall thickening. No small bowel dilatation. The terminal ileum is normal. No gross colonic mass. No colonic wall thickening. Vascular/Lymphatic: No abdominal aortic aneurysm. As noted above, recurrent disease encases the SMA and obliterates the SMV. Portal vein and splenic vein are patent. Reproductive: Uterus surgically absent.  There is no adnexal mass. Other: No intraperitoneal free fluid. Musculoskeletal: Adjacent midline ventral hernias are stable, containing only fat. No worrisome lytic or  sclerotic osseous abnormality. Status post lumbar fusion. Stable L2 compression fracture. Compression deformity noted at T7 and T10. IMPRESSION: 1. No substantial interval change in exam. Abnormal soft tissue encasing the SMA and occluding the SMV is again noted, compatible with  recurrent disease. 2. Hepatic steatosis. 3. Multifocal ventral hernia containing only fat. Electronically Signed   By: Misty Stanley M.D.   On: 12/03/2018 10:53    Medications: I have reviewed the patient's current medications.   Assessment/Plan: 1. 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  CT abdomen/pelvis 01/19/2015 with no evidence of recurrent/metastatic disease.  CT abdomen/pelvis 07/20/2018- infiltrating soft tissue at the pancreas resection bed with involvement of the SMA, occlusion of the SMV with collaterals. New L2 compression fracture  SBRT beginning 08/21/2018, completed 08/31/2018, 5 fractions  CT 12/03/2018-no change in abnormal soft tissue encasing the SMA  2. Nausea following the Whipple procedure-improved 3. Mild thrombocytopenia , Chronic 4.Pain secondary to local recurrence of pancreas cancer   Disposition: [name] appears unchanged.  She has multiple complaints today.  I doubt her symptoms are related to recurrence of the pancreas cancer.  She reports chronic low back pain-likely secondary to benign musculoskeletal disease.  The intermittent abdominal pain may be related to the pancreas cancer.  Intermittent diarrhea may be related to pancreatic insufficiency or polypharmacy.  I recommend observation.  I think it would be difficult for [name] to tolerate chemotherapy.  I discussed the CT findings and her current status, and prognosis with her son by telephone.  Betsy Coder, MD  12/05/2018  11:14 AM

## 2018-12-15 ENCOUNTER — Other Ambulatory Visit: Payer: Self-pay | Admitting: Neurology

## 2018-12-17 ENCOUNTER — Telehealth: Payer: Self-pay | Admitting: *Deleted

## 2018-12-17 ENCOUNTER — Encounter: Payer: Self-pay | Admitting: Oncology

## 2018-12-17 NOTE — Telephone Encounter (Signed)
Spoke with patient regarding itching that just started last night. She does not have a rash or any known environmental exposures. Does not think she has dry skin. RN suggested she try OTC anti-itch medicine such as Benadry. Also says her left arm has been hurting since last night as well. Denies any trauma, swelling or reddness in arm. Suggested she try OTC pain med and if pain persists, contact her PCP. This does not sound like it is related to her cancer.

## 2018-12-25 ENCOUNTER — Encounter: Payer: Self-pay | Admitting: Oncology

## 2018-12-26 ENCOUNTER — Telehealth: Payer: Self-pay | Admitting: *Deleted

## 2018-12-26 NOTE — Telephone Encounter (Signed)
Spoke w/son regarding his report of increased diarrhea--having 1-several times/day. Was in bathroom almost all day on Saturday. When he saw her that day she was "out of it" and had not taken her meds for 2 days. Got her back on meds Sunday. Per Dr. [name]: Doubtful this is related to her cancer. She should contact her PCP for evaluation. Possibly could have C. Diff infection. He agrees to call [name].

## 2018-12-27 ENCOUNTER — Encounter: Payer: Self-pay | Admitting: Family Medicine

## 2018-12-27 ENCOUNTER — Ambulatory Visit (INDEPENDENT_AMBULATORY_CARE_PROVIDER_SITE_OTHER): Payer: Medicare Other | Admitting: Family Medicine

## 2018-12-27 ENCOUNTER — Other Ambulatory Visit: Payer: Self-pay

## 2018-12-27 ENCOUNTER — Ambulatory Visit: Payer: Medicare Other | Admitting: Physician Assistant

## 2018-12-27 ENCOUNTER — Telehealth: Payer: Self-pay | Admitting: *Deleted

## 2018-12-27 DIAGNOSIS — I2510 Atherosclerotic heart disease of native coronary artery without angina pectoris: Secondary | ICD-10-CM

## 2018-12-27 DIAGNOSIS — R197 Diarrhea, unspecified: Secondary | ICD-10-CM

## 2018-12-27 NOTE — Telephone Encounter (Signed)
-----   Message from Lucretia Kern, DO sent at 12/27/2018  3:26 PM EDT ----- -lab visit or cup pick up at [place] office per pt preference for stool studies-follow up with PCP next week in about 5-7 days

## 2018-12-27 NOTE — Telephone Encounter (Signed)
Per Teressa Senter at the [facility] office, she can view the orders and the pt can come to their office to pick up the cup for the studies.  I called the pt and informed her of this and advised she call Dr Alcario Drought office for an appt as I cannot schedule for their office.
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
## 2018-12-31 ENCOUNTER — Other Ambulatory Visit: Payer: Self-pay

## 2018-12-31 ENCOUNTER — Other Ambulatory Visit: Payer: Medicare Other

## 2018-12-31 DIAGNOSIS — R197 Diarrhea, unspecified: Secondary | ICD-10-CM

## 2018-12-31 DIAGNOSIS — R109 Unspecified abdominal pain: Secondary | ICD-10-CM

## 2018-12-31 NOTE — Addendum Note (Signed)
Addended by: Francis Dowse T on: 12/31/2018 04:50 PM   Modules accepted: Orders

## 2019-01-01 LAB — CLOSTRIDIUM DIFFICILE BY PCR: Toxigenic C. Difficile by PCR: NEGATIVE

## 2019-01-02 ENCOUNTER — Telehealth: Payer: Self-pay | Admitting: Family Medicine

## 2019-01-02 NOTE — Telephone Encounter (Signed)
Copied from [place] [id number]. Topic: General - Inquiry >> Jan 02, 2019  4:26 PM Virl Axe D wrote: Reason for CRM: Pt's son called to check on pt's lab results from stool sample given on 12/31/18. Requesting callback as soon as they are ready. Please advise

## 2019-01-03 NOTE — Telephone Encounter (Signed)
Please advise 

## 2019-01-03 NOTE — Telephone Encounter (Signed)
Pt had an virtual  appt with dr Maudie Mercury on 12-27-2018. Pt is still having diarrhea please advise

## 2019-01-03 NOTE — Telephone Encounter (Signed)
Please advise think stool test were ordered by your office.

## 2019-01-03 NOTE — Telephone Encounter (Signed)
Check the chart - I did not order this.

## 2019-01-04 ENCOUNTER — Ambulatory Visit (INDEPENDENT_AMBULATORY_CARE_PROVIDER_SITE_OTHER): Payer: Medicare Other | Admitting: Family Medicine

## 2019-01-04 ENCOUNTER — Ambulatory Visit: Payer: Self-pay | Admitting: *Deleted

## 2019-01-04 ENCOUNTER — Other Ambulatory Visit (INDEPENDENT_AMBULATORY_CARE_PROVIDER_SITE_OTHER): Payer: Medicare Other

## 2019-01-04 ENCOUNTER — Telehealth: Payer: Self-pay | Admitting: Family Medicine

## 2019-01-04 ENCOUNTER — Other Ambulatory Visit: Payer: Self-pay

## 2019-01-04 VITALS — BP 145/86 | HR 76 | Ht 61.0 in | Wt 142.0 lb

## 2019-01-04 DIAGNOSIS — K8681 Exocrine pancreatic insufficiency: Secondary | ICD-10-CM

## 2019-01-04 DIAGNOSIS — R1030 Lower abdominal pain, unspecified: Secondary | ICD-10-CM

## 2019-01-04 DIAGNOSIS — M810 Age-related osteoporosis without current pathological fracture: Secondary | ICD-10-CM

## 2019-01-04 DIAGNOSIS — K529 Noninfective gastroenteritis and colitis, unspecified: Secondary | ICD-10-CM | POA: Diagnosis not present

## 2019-01-04 DIAGNOSIS — R197 Diarrhea, unspecified: Secondary | ICD-10-CM | POA: Diagnosis not present

## 2019-01-04 DIAGNOSIS — C801 Malignant (primary) neoplasm, unspecified: Secondary | ICD-10-CM

## 2019-01-04 DIAGNOSIS — M150 Primary generalized (osteo)arthritis: Secondary | ICD-10-CM

## 2019-01-04 DIAGNOSIS — E538 Deficiency of other specified B group vitamins: Secondary | ICD-10-CM | POA: Diagnosis not present

## 2019-01-04 DIAGNOSIS — F4323 Adjustment disorder with mixed anxiety and depressed mood: Secondary | ICD-10-CM | POA: Diagnosis not present

## 2019-01-04 DIAGNOSIS — G2401 Drug induced subacute dyskinesia: Secondary | ICD-10-CM

## 2019-01-04 DIAGNOSIS — C250 Malignant neoplasm of head of pancreas: Secondary | ICD-10-CM | POA: Diagnosis not present

## 2019-01-04 DIAGNOSIS — I700 Atherosclerosis of aorta: Secondary | ICD-10-CM

## 2019-01-04 DIAGNOSIS — E039 Hypothyroidism, unspecified: Secondary | ICD-10-CM | POA: Diagnosis not present

## 2019-01-04 DIAGNOSIS — M159 Polyosteoarthritis, unspecified: Secondary | ICD-10-CM

## 2019-01-04 DIAGNOSIS — K219 Gastro-esophageal reflux disease without esophagitis: Secondary | ICD-10-CM | POA: Diagnosis not present

## 2019-01-04 DIAGNOSIS — K760 Fatty (change of) liver, not elsewhere classified: Secondary | ICD-10-CM | POA: Diagnosis not present

## 2019-01-04 DIAGNOSIS — I2510 Atherosclerotic heart disease of native coronary artery without angina pectoris: Secondary | ICD-10-CM

## 2019-01-04 LAB — STOOL CULTURE
MICRO NUMBER:: 730253
MICRO NUMBER:: 730254
MICRO NUMBER:: 730255
SHIGA RESULT:: NOT DETECTED
SPECIMEN QUALITY:: ADEQUATE
SPECIMEN QUALITY:: ADEQUATE
SPECIMEN QUALITY:: ADEQUATE

## 2019-01-04 LAB — POCT HEMOGLOBIN: Hemoglobin: 12.9 g/dL (ref 11–14.6)

## 2019-01-04 MED ORDER — METRONIDAZOLE 500 MG PO TABS: 500.0000 mg | ORAL_TABLET | Freq: Three times a day (TID) | ORAL | 0 refills | Status: DC

## 2019-01-04 NOTE — Telephone Encounter (Signed)
Patient is scheduled to be seen today.

## 2019-01-04 NOTE — Telephone Encounter (Signed)
Stool cultures were ok. Needs a follow up visit if still having diarrhea. Make sure not eating/drinking any dairy as this will cause diarrhea to continue. (at least stop until stools are formed). If abd pain, difficulty with hydration needs visit today.

## 2019-01-04 NOTE — Telephone Encounter (Signed)
See note

## 2019-01-04 NOTE — Telephone Encounter (Signed)
Pt and her son [name] called stating that the pt has been having diarrhea for over a month; they have left several messages for the practice; she saw oncology on June 1 and discussed the issue with him; she was taking Imodium which had worked; she had a negative c.diff on 12/31/2018; he says that the pt has 3-5 episodes daily; she is also weak and fatigued; the pt says the stools are loose and watery; recommendations made per nurse triage protocol; they verbalize understanding;; she sees Dr Juleen China, [place];  pt transferred to [facility] for schedulingk Reason for Disposition . [1] MODERATE diarrhea (e.g., 4-6 times / day more than normal) AND [2] present > 48 hours (2 days)  Answer Assessment - Initial Assessment Questions 1. DIARRHEA SEVERITY: "How bad is the diarrhea?" "How many extra stools have you had in the past 24 hours than normal?"    - MILD (Scale 1-3; CTCAE Grade 1): Few loose or mushy BMs; increase of 1-3 stools over normal daily number of stools; mild increase in ostomy output.   - MODERATE (Scale 4-7; CTCAE Grade 2): Increase of 4-6 stools daily over normal; moderate increase in ostomy output.   - SEVERE (Scale 8-10; or worst possible; CTCAE Grade 3): Increase of 7 or more stools daily over normal; moderate increase in ostomy output; incontinence.     Moderate 3-5 episodes daily 2. ONSET: "When did the diarrhea begin?"      1st week of June 2020 3. BM CONSISTENCY: "How loose or watery is the diarrhea?" "Is there any blood in the stool?"    Majority of the time it is watery 4. BM ODOR: "Does the stool smell worse or different than usual?"     no 5. VOMITING: "Are you also vomiting?" If so, ask: "How many times in the past 24 hours?"      No, but having nausea 6. ABDOMINAL PAIN: "Are you having any abdominal pain?" If yes: "What does it feel like?" (e.g., crampy, dull, intermittent, constant)    Yes intermittent; crampy 7. ABDOMINAL PAIN SEVERITY: If present, ask: "How bad is  the pain?"  (e.g., Scale 1-10; mild, moderate, or severe)    - MILD (1-3): doesn't interfere with normal activities, abdomen soft and not tender to touch     - MODERATE (4-7): interferes with normal activities or awakens from sleep, tender to touch     - SEVERE (8-10): excruciating pain, doubled over, unable to do any normal activities      Intermittent cramps rated 9 out of 10 8. ORAL INTAKE: If vomiting, "Have you been able to drink liquids?" "How much fluids have you had in the past 24 hours?"    n/a 9. HYDRATION: "Any signs of dehydration?" (e.g., dry mouth [not just dry lips], too weak to stand, dizziness, new weight loss) "When did you last urinate?"    2-3 cans of soft drink, and 3 glasses of water daily 10. EXPOSURE: "Have you traveled to a foreign country recently?" "Have you been exposed to anyone with diarrhea?" "Could you have eaten any food that was spoiled?"       no 11. CANCER: "What type of cancer do you have?"       pancreatic cancer 12. CANCER - TREATMENT: "What cancer treatments have you received?" "When did you last take or receive them?" (e.g., recent surgery, radiation, immunotherapy, or chemotherapy). If triager has access to the patient's medical record, triager should review treatments and administration dates.  Radiation therapy 13. CANCER - NEUTROPENIA RISK: "Have you received chemotherapy recently? If Yes, "When was it and what was your last CBC and ANC (absolute neutrophil count)?" "Were you told that your white cell count was low?" If triager has access to the patient's medical record, triager should review most recent labs. An ANC less than 1,000 - 1,500 means that the neutrophils are low and the immune system is weak.       14. C DIFF RSK: "Have you ever had c-difficile (C-Diff) diarrhea?"        Negative test on 12/31/2018 15. DIARRHEA MEDICINES: "Are you taking any medicines right now to make the diarrhea better?" If yes, "What drugs are you taking?" (e.g.,  Immodium, Lomotil)       Immodium 14. OTHER SYMPTOMS: "Do you have any other symptoms?" (e.g., fever, blood in stool)       Bloating and nausea  Protocols used: CANCER - DIARRHEA-A-AH

## 2019-01-04 NOTE — Telephone Encounter (Signed)
Spoke to son added for doxy to see wallace today

## 2019-01-04 NOTE — Telephone Encounter (Signed)
I called the pt to inform her of the results and she asked that I speak with her son.  [name] was informed of the message below and stated the pt is still having diarrhea, abdominal pain and had a virtual visit and is going now for labs.

## 2019-01-04 NOTE — Telephone Encounter (Signed)
Patients son called in very upset he called twice this week and has not received a call back. Patient son just wants to know what his mother can do about the Diarrhea. She had it about a month and its happening several times a day. Patients son would like a call back. Please advise.

## 2019-01-04 NOTE — Progress Notes (Signed)
Virtual Visit via Video   Due to the COVID-19 pandemic, this visit was completed with telemedicine (audio/video) technology to reduce patient and provider exposure as well as to preserve personal protective equipment.   I connected with [name] by a video enabled telemedicine application and verified that I am speaking with the correct person using two identifiers. Location patient: Home Location provider: [facility], Office Persons participating in the virtual visit: TESNEEM DUFRANE, Briscoe Deutscher, DO   I discussed the limitations of evaluation and management by telemedicine and the availability of in person appointments. The patient expressed understanding and agreed to proceed.  Care Team   Patient Care Team: Briscoe Deutscher, DO as PCP - General (Family Medicine) Marcial Pacas, MD (Neurology) Ladell Pier, MD as Consulting Physician (Oncology) Ladell Pier, MD as Consulting Physician (Oncology) Skeet Latch, MD as Attending Physician (Cardiology)  Subjective:   HPI:   Diarrhea, > 1 month, worsening, loose to watery stools, muddy-colored, with incontinence, lower abdominal cramping associated, feeling weak and lightheaded at times, Imodium helped early on but not now, stool studies negative last week. History of pancreatic cancer, followed by Oncology, was told that diarrhea was not related to treatments.  Review of Systems  Constitutional: Positive for chills. Negative for fever.  HENT: Negative for hearing loss and tinnitus.   Eyes: Negative for blurred vision and double vision.  Respiratory: Negative for cough and wheezing.   Cardiovascular: Negative for chest pain, palpitations and leg swelling.  Gastrointestinal: Positive for nausea. Negative for vomiting.  Genitourinary: Negative for dysuria and urgency.  Musculoskeletal: Negative for myalgias.  Neurological: Positive for weakness.  Psychiatric/Behavioral: Negative for depression and suicidal ideas.     Patient Active Problem List   Diagnosis Date Noted  . Aortic atherosclerosis ([place]) 01/05/2019  . 3-vessel CAD, on CT chest 01/05/2019  . Primary open angle glaucoma (POAG) of both eyes, moderate stage 04/18/2018  . Pseudophakia, right eye 04/18/2018  . History of lumbar fusion 11/01/2017  . Tardive dyskinesia, vitamin E   . History of stroke   . Mitral valve prolapse   . Chronic maxillary sinusitis, using NetiPot, flonase   . 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 Chronic left sacral fracture with mild edema, MRI 07/2017 01/07/2017  . Thrombocytopenia ([place]), mild, chronic 01/07/2017  . HLD (hyperlipidemia), on Lovastatin 07/28/2015  . Fatty liver, on CT 09/09/14 09/09/2014  . Exocrine pancreatic insufficiency, Rx Creon 12/30/2013  . Carcinoma of head of pancreas [facility]), followed by Dr [name], Oncology 07/01/2013  . Peripheral neuropathy, Lyrica 400 mg BID 08/30/2012  . Nuclear sclerosis, left 04/13/2011  . 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 Colon polyps 06/05/2008  . Osteoarthritis, mulitple sites 06/04/2008  . Chronic diarrhea, intermittent 04/22/2008  . Hypothyroidism, on Levothryoxine 02/26/2007  . Adjustment disorder with mixed anxiety and depressed mood, on Cymbalta and Buspar 02/26/2007  . Essential hypertension 02/26/2007  . GERD, on Prilosec 02/26/2007  . Osteoporosis, on Calcium and Vitamin D 02/26/2007    Social History   Tobacco Use  . Smoking status: Never Smoker  . Smokeless tobacco: Never Used  Substance Use Topics  . Alcohol use: No   Current Outpatient Medications:  .  Ascorbic Acid (VITAMIN C) 1000 MG tablet, Take 1,000 mg by mouth daily., Disp: , Rfl:  .  aspirin EC 81 MG tablet, Take 81 mg by mouth daily., Disp: , Rfl:  .  atenolol (TENORMIN) 50 MG tablet, TAKE 1 TABLET BY MOUTH TWICE A DAY,  Disp: 180 tablet, Rfl: 2 .  betamethasone dipropionate 0.05 % lotion, Apply 1 application topically daily as needed (itching). , Disp: , Rfl:  .   busPIRone (BUSPAR) 5 MG tablet, Take 1 tablet by mouth twice daily, Disp: 180 tablet, Rfl: 0 .  Calcium Carb-Cholecalciferol (CALCIUM 600+D) 600-800 MG-UNIT TABS, Take 1 tablet by mouth daily., Disp: , Rfl:  .  carboxymethylcellulose (REFRESH TEARS) 0.5 % SOLN, Place 1 drop into both eyes 2 (two) times daily. , Disp: , Rfl:  .  cholecalciferol (VITAMIN D) 1000 units tablet, Take 1,000 Units by mouth 2 (two) times daily., Disp: , Rfl:  .  Coenzyme Q10 300 MG CAPS, Take 300 mg by mouth daily., Disp: , Rfl:  .  DULoxetine (CYMBALTA) 60 MG capsule, TAKE 1 CAPSULE (60 MG TOTAL) BY MOUTH 2 (TWO) TIMES DAILY., Disp: 180 capsule, Rfl: 0 .  furosemide (LASIX) 40 MG tablet, 1/2 TABLET BY MOUTH DAILY FOR 3 DAYS AND THEN 1 DAILY, Disp: 270 tablet, Rfl: 1 .  hydrochlorothiazide (MICROZIDE) 12.5 MG capsule, TAKE 1 CAPSULE BY MOUTH EVERY DAY, Disp: 90 capsule, Rfl: 2 .  HYDROcodone-acetaminophen (NORCO/VICODIN) 5-325 MG tablet, Take 1 tablet by mouth every 6 (six) hours as needed for moderate pain., Disp: 60 tablet, Rfl: 0 .  latanoprost (XALATAN) 0.005 % ophthalmic solution, Place 1 drop into both eyes at bedtime. , Disp: , Rfl:  .  levothyroxine (SYNTHROID, LEVOTHROID) 175 MCG tablet, TAKE 1 TABLET BY MOUTH  DAILY BEFORE BREAKFAST, Disp: 90 tablet, Rfl: 3 .  lisinopril (ZESTRIL) 40 MG tablet, TAKE 1 TABLET BY MOUTH EVERY DAY, Disp: 90 tablet, Rfl: 2 .  loratadine (CLARITIN) 10 MG tablet, Take 10 mg by mouth daily., Disp: , Rfl:  .  lovastatin (MEVACOR) 40 MG tablet, TAKE 1 TABLET BY MOUTH EVERYDAY AT BEDTIME, Disp: 90 tablet, Rfl: 0 .  omeprazole (PRILOSEC) 20 MG capsule, TAKE 1 CAPSULE BY MOUTH EVERY DAY, Disp: 90 capsule, Rfl: 1 .  ondansetron (ZOFRAN) 8 MG tablet, Take 1 tablet (8 mg total) by mouth every 8 (eight) hours., Disp: 90 tablet, Rfl: 1 .  Pancrelipase, Lip-Prot-Amyl, (CREON) 24000-76000 units CPEP, Take 2 capsules by mouth 3 (three) times daily with meals. , Disp: , Rfl:  .  pregabalin (LYRICA)  300 MG capsule, Take 1 capsule (300 mg total) by mouth 2 (two) times daily., Disp: 180 capsule, Rfl: 1 .  psyllium (REGULOID) 0.52 g capsule, Take 1.04 g by mouth daily. , Disp: , Rfl:  .  rOPINIRole (REQUIP) 0.5 MG tablet, TAKE 1 TABLET BY MOUTH AT BEDTIME, Disp: 90 tablet, Rfl: 1 .  sennosides-docusate sodium (SENOKOT-S) 8.6-50 MG tablet, Take 1 tablet by mouth daily. , Disp: , Rfl:  .  simethicone (MYLICON) 259 MG chewable tablet, Chew 125 mg by mouth 3 (three) times daily., Disp: , Rfl:  .  tolterodine (DETROL LA) 4 MG 24 hr capsule, Take 4 mg by mouth daily. , Disp: , Rfl: 6 .  triamcinolone ointment (KENALOG) 0.5 %, Apply 1 application topically 2 (two) times daily. (Patient taking differently: Apply 1 application topically 2 (two) times daily as needed (irritation). ), Disp: 30 g, Rfl: 0  Allergies  Allergen Reactions  . Metoclopramide Hcl Other (See Comments)    Shaking; caused tardive dyskinesia  . Other Other (See Comments)    According to patient Trilafor and Reglan cause Tardive Dyskinesia  . Niacin Other (See Comments)    shaking  . Trovan [Alatrofloxacin Mesylate] Other (See Comments)  Caused shaking and nervousness  . Benzocaine-Resorcinol Rash  . Celecoxib Other (See Comments)    unknown  . Erythromycin Base Rash  . Glucosamine Other (See Comments)    unknown  . Nortriptyline Other (See Comments)    Dizziness   . Phenazopyridine Hcl Other (See Comments)    unknown  . Sulfa Antibiotics Nausea And Vomiting and Rash  . Sulfonamide Derivatives Nausea And Vomiting and Rash   Objective:   VITALS: Per patient if applicable, see vitals. GENERAL: Alert, pale. HEENT: Atraumatic, no obvious abnormalities on inspection of external nose and ears. NECK: Normal movements of the head and neck. CARDIOPULMONARY: No increased WOB. Speaking in clear sentences. I:E ratio WNL.  MS: Moves all visible extremities without noticeable abnormality. PSYCH: Pleasant and cooperative,  well-groomed. Speech normal rate and rhythm. Affect is appropriate. Insight and judgement are appropriate. Attention is focused, linear, and appropriate.  NEURO: CN grossly intact. Oriented as arrived to appointment on time with no prompting. Moves both UE equally.  SKIN: No obvious lesions, wounds, erythema, or cyanosis noted on face or hands.  Results for orders placed or performed in visit on 01/04/19  CBC with Differential/Platelet  Result Value Ref Range   WBC 5.6 3.8 - 10.8 Thousand/uL   RBC 3.78 (L) 3.80 - 5.10 Million/uL   Hemoglobin 12.4 11.7 - 15.5 g/dL   HCT 36.2 35.0 - 45.0 %   MCV 95.8 80.0 - 100.0 fL   MCH 32.8 27.0 - 33.0 pg   MCHC 34.3 32.0 - 36.0 g/dL   RDW 14.4 11.0 - 15.0 %   Platelets 139 (L) 140 - 400 Thousand/uL   MPV 11.1 7.5 - 12.5 fL   Neutro Abs 4,301 1,500 - 7,800 cells/uL   Lymphs Abs 762 (L) 850 - 3,900 cells/uL   Absolute Monocytes 330 200 - 950 cells/uL   Eosinophils Absolute 168 15 - 500 cells/uL   Basophils Absolute 39 0 - 200 cells/uL   Neutrophils Relative % 76.8 %   Total Lymphocyte 13.6 %   Monocytes Relative 5.9 %   Eosinophils Relative 3.0 %   Basophils Relative 0.7 %  Comprehensive metabolic panel  Result Value Ref Range   Glucose, Bld 104 (H) 65 - 99 mg/dL   BUN 15 7 - 25 mg/dL   Creat 0.74 0.60 - 0.93 mg/dL   BUN/Creatinine Ratio NOT APPLICABLE 6 - 22 (calc)   Sodium 142 135 - 146 mmol/L   Potassium 3.4 (L) 3.5 - 5.3 mmol/L   Chloride 100 98 - 110 mmol/L   CO2 30 20 - 32 mmol/L   Calcium 8.5 (L) 8.6 - 10.4 mg/dL   Total Protein 6.3 6.1 - 8.1 g/dL   Albumin 3.8 3.6 - 5.1 g/dL   Globulin 2.5 1.9 - 3.7 g/dL (calc)   AG Ratio 1.5 1.0 - 2.5 (calc)   Total Bilirubin 0.7 0.2 - 1.2 mg/dL   Alkaline phosphatase (APISO) 132 37 - 153 U/L   AST 28 10 - 35 U/L   ALT 26 6 - 29 U/L  TSH  Result Value Ref Range   TSH 2.68 0.40 - 4.50 mIU/L  Vitamin B12  Result Value Ref Range   Vitamin B-12 687 200 - 1,100 pg/mL  Magnesium  Result Value  Ref Range   Magnesium 1.7 1.5 - 2.5 mg/dL  Lipase  Result Value Ref Range   Lipase <5 (L) 7 - 60 U/L  Urinalysis, Routine w reflex microscopic  Result Value Ref Range   Color,  Urine YELLOW YELLOW   APPearance CLOUDY (A) CLEAR   Specific Gravity, Urine 1.016 1.001 - 1.03   pH 5.5 5.0 - 8.0   Glucose, UA NEGATIVE NEGATIVE   Bilirubin Urine NEGATIVE NEGATIVE   Ketones, ur NEGATIVE NEGATIVE   Hgb urine dipstick NEGATIVE NEGATIVE   Protein, ur TRACE (A) NEGATIVE   Nitrite POSITIVE (A) NEGATIVE   Leukocytes,Ua 2+ (A) NEGATIVE   WBC, UA 20-40 (A) 0 - 5 /HPF   RBC / HPF 0-2 0 - 2 /HPF   Squamous Epithelial / LPF 0-5 < OR = 5 /HPF   TRANSITIONAL EPITHELIAL CELLS 0-5 < OR = 5 /HPF   Bacteria, UA MANY (A) NONE SEEN /HPF   Hyaline Cast NONE SEEN NONE SEEN /LPF  Sedimentation rate  Result Value Ref Range   Sed Rate 22 0 - 30 mm/h  C-reactive protein  Result Value Ref Range   CRP 12.9 (H) <8.0 mg/L  POCT hemoglobin  Result Value Ref Range   Hemoglobin 12.9 11 - 14.6 g/dL   *Note: Due to a large number of results and/or encounters for the requested time period, some results have not been displayed. A complete set of results can be found in Results Review.   Assessment and Plan:   1. Diarrhea, functional v infectious   2. Lower abdominal pain   3. B12 deficiency   4. Pancreatic cancer ([place]), MRI 06/19/13   5. Acquired hypothyroidism   6. Gastroesophageal reflux disease without esophagitis   7. Exocrine pancreatic insufficiency   8. Carcinoma of head of pancreas ([place]), followed by Dr [name], Oncology   9. Fatty liver, on CT 09/09/14   10. Chronic diarrhea, intermittent   11. Primary osteoarthritis involving multiple joints   12. Adjustment disorder with mixed anxiety and depressed mood, on Cymbalta and Buspar   13. Tardive dyskinesia, vitamin E   14. Osteoporosis, unspecified osteoporosis type, unspecified pathological fracture presence   15. Aortic atherosclerosis ([facility])   16.  3-vessel CAD, on CT chest    Diarrhea, acute on chronic, worsening, infectious v functional Started Flagyl as a precaution, will call in potassium to supplement, UCx pending  Carcinoma of head of pancreas ([place]), followed by Dr [name], Oncology Pancreas cancer-clinical stage I (T1 N0 M0)   Normal preoperative CA 19-9  Status post a pancreaticoduodenectomy procedure 08/07/2013 confirming a moderately differentiated (T3 N0) tumor with negative surgical margins, 12 negative lymph nodes, no lymphovascular invasion, perineural invasion present  CT abdomen/pelvis 02/15/2014 with no evidence of recurrent/metastatic disease.  CT abdomen/pelvis 01/19/2015 with no evidence of recurrent/metastatic disease.  CT abdomen/pelvis 07/20/2018-infiltrating soft tissue at the pancreas resection bed with involvement of the SMA, occlusion of the SMV with collaterals. New L2 compression fracture  SBRT beginning 08/21/2018, completed 08/31/2018, 5 fractions  CT 12/03/2018, no change in abnormal soft tissue encasing the SMA  Orders Placed This Encounter  Procedures  . Fecal occult blood, imunochemical  . Urine Culture  . MICROSCOPIC MESSAGE  . C-reactive protein  . Sedimentation rate  . Urinalysis, Routine w reflex microscopic  . Lipase  . Magnesium  . Vitamin B12  . TSH  . Comprehensive metabolic panel  . CBC with Differential/Platelet  . POCT hemoglobin   Meds ordered this encounter  Medications  . metroNIDAZOLE (FLAGYL) 500 MG tablet    Sig: Take 1 tablet (500 mg total) by mouth 3 (three) times daily.    Dispense:  21 tablet    Refill:  0   . COVID-19  Education: The signs and symptoms of COVID-19 were discussed with the patient and how to seek care for testing if needed. The importance of social distancing was discussed today. . Reviewed expectations re: course of current medical issues. . Discussed self-management of symptoms. . Outlined signs and symptoms indicating need for more acute  intervention. . Patient verbalized understanding and all questions were answered. Marland Kitchen Health Maintenance issues including appropriate healthy diet, exercise, and smoking avoidance were discussed with patient. . See orders for this visit as documented in the electronic medical record.  Briscoe Deutscher, DO  Records requested if needed. Time spent: 25 minutes, of which >50% was spent in obtaining information about her symptoms, reviewing her previous labs, evaluations, and treatments, counseling her about her condition (please see the discussed topics above), and developing a plan to further investigate it; she had a number of questions which I addressed.

## 2019-01-05 ENCOUNTER — Encounter: Payer: Self-pay | Admitting: Family Medicine

## 2019-01-05 DIAGNOSIS — I700 Atherosclerosis of aorta: Secondary | ICD-10-CM | POA: Insufficient documentation

## 2019-01-05 DIAGNOSIS — I2510 Atherosclerotic heart disease of native coronary artery without angina pectoris: Secondary | ICD-10-CM | POA: Insufficient documentation

## 2019-01-05 LAB — CBC WITH DIFFERENTIAL/PLATELET
Absolute Monocytes: 330 cells/uL (ref 200–950)
Basophils Absolute: 39 cells/uL (ref 0–200)
Basophils Relative: 0.7 %
Eosinophils Absolute: 168 cells/uL (ref 15–500)
Eosinophils Relative: 3 %
HCT: 36.2 % (ref 35.0–45.0)
Hemoglobin: 12.4 g/dL (ref 11.7–15.5)
Lymphs Abs: 762 cells/uL — ABNORMAL LOW (ref 850–3900)
MCH: 32.8 pg (ref 27.0–33.0)
MCHC: 34.3 g/dL (ref 32.0–36.0)
MCV: 95.8 fL (ref 80.0–100.0)
MPV: 11.1 fL (ref 7.5–12.5)
Monocytes Relative: 5.9 %
Neutro Abs: 4301 cells/uL (ref 1500–7800)
Neutrophils Relative %: 76.8 %
Platelets: 139 10*3/uL — ABNORMAL LOW (ref 140–400)
RBC: 3.78 10*6/uL — ABNORMAL LOW (ref 3.80–5.10)
RDW: 14.4 % (ref 11.0–15.0)
Total Lymphocyte: 13.6 %
WBC: 5.6 10*3/uL (ref 3.8–10.8)

## 2019-01-05 LAB — URINALYSIS, ROUTINE W REFLEX MICROSCOPIC
Bilirubin Urine: NEGATIVE
Glucose, UA: NEGATIVE
Hgb urine dipstick: NEGATIVE
Hyaline Cast: NONE SEEN /LPF
Ketones, ur: NEGATIVE
Nitrite: POSITIVE — AB
Specific Gravity, Urine: 1.016 (ref 1.001–1.03)
pH: 5.5 (ref 5.0–8.0)

## 2019-01-05 LAB — COMPREHENSIVE METABOLIC PANEL
AG Ratio: 1.5 (calc) (ref 1.0–2.5)
ALT: 26 U/L (ref 6–29)
AST: 28 U/L (ref 10–35)
Albumin: 3.8 g/dL (ref 3.6–5.1)
Alkaline phosphatase (APISO): 132 U/L (ref 37–153)
BUN: 15 mg/dL (ref 7–25)
CO2: 30 mmol/L (ref 20–32)
Calcium: 8.5 mg/dL — ABNORMAL LOW (ref 8.6–10.4)
Chloride: 100 mmol/L (ref 98–110)
Creat: 0.74 mg/dL (ref 0.60–0.93)
Globulin: 2.5 g/dL (calc) (ref 1.9–3.7)
Glucose, Bld: 104 mg/dL — ABNORMAL HIGH (ref 65–99)
Potassium: 3.4 mmol/L — ABNORMAL LOW (ref 3.5–5.3)
Sodium: 142 mmol/L (ref 135–146)
Total Bilirubin: 0.7 mg/dL (ref 0.2–1.2)
Total Protein: 6.3 g/dL (ref 6.1–8.1)

## 2019-01-05 LAB — VITAMIN B12: Vitamin B-12: 687 pg/mL (ref 200–1100)

## 2019-01-05 LAB — TSH: TSH: 2.68 mIU/L (ref 0.40–4.50)

## 2019-01-05 LAB — C-REACTIVE PROTEIN: CRP: 12.9 mg/L — ABNORMAL HIGH (ref <8.0)

## 2019-01-05 LAB — LIPASE: Lipase: <5 U/L — ABNORMAL LOW (ref 7–60)

## 2019-01-05 LAB — MAGNESIUM: Magnesium: 1.7 mg/dL (ref 1.5–2.5)

## 2019-01-05 LAB — SEDIMENTATION RATE: Sed Rate: 22 mm/h (ref 0–30)

## 2019-01-05 NOTE — Assessment & Plan Note (Signed)
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  CT abdomen/pelvis 01/19/2015 with no evidence of recurrent/metastatic disease.  CT abdomen/pelvis 07/20/2018-infiltrating soft tissue at the pancreas resection bed with involvement of the SMA, occlusion of the SMV with collaterals. New L2 compression fracture  SBRT beginning 08/21/2018, completed 08/31/2018, 5 fractions  CT 12/03/2018, no change in abnormal soft tissue encasing the SMA

## 2019-01-06 LAB — URINE CULTURE
MICRO NUMBER:: 748798
SPECIMEN QUALITY:: ADEQUATE

## 2019-01-06 MED ORDER — POTASSIUM CHLORIDE ER 10 MEQ PO TBCR: 10.0000 meq | EXTENDED_RELEASE_TABLET | Freq: Two times a day (BID) | ORAL | 0 refills | Status: DC

## 2019-01-06 NOTE — Addendum Note (Signed)
Addended by: Briscoe Deutscher R on: 01/06/2019 03:02 PM   Modules accepted: Orders

## 2019-01-07 ENCOUNTER — Telehealth: Payer: Self-pay

## 2019-01-07 ENCOUNTER — Other Ambulatory Visit: Payer: Self-pay

## 2019-01-07 DIAGNOSIS — R197 Diarrhea, unspecified: Secondary | ICD-10-CM

## 2019-01-07 MED ORDER — CIPROFLOXACIN HCL 250 MG PO TABS: 250.0000 mg | ORAL_TABLET | Freq: Two times a day (BID) | ORAL | 0 refills | Status: DC

## 2019-01-07 NOTE — Telephone Encounter (Signed)
I called son and informed him that GI appointment sent as urgent.  Hopefully pt will get in this week.

## 2019-01-07 NOTE — Addendum Note (Signed)
Addended by: Briscoe Deutscher R on: 01/07/2019 06:24 AM   Modules accepted: Orders

## 2019-01-07 NOTE — Telephone Encounter (Signed)
Copied from [place] [id number]. Topic: Referral - Request for Referral >> Jan 07, 2019  2:13 PM Percell Belt A wrote: Has patient seen PCP for this complaint? Yes  *If NO, is insurance requiring patient see PCP for this issue before PCP can refer them? Referral for which specialty: GI  Preferred provider/office: Laurence Spates at [facility] for referral: Diarrhea issue.  They can get her in this week if Dr Juleen China thinks it is urgent per son [phone]

## 2019-01-09 DIAGNOSIS — R197 Diarrhea, unspecified: Secondary | ICD-10-CM | POA: Diagnosis not present

## 2019-01-09 DIAGNOSIS — K8689 Other specified diseases of pancreas: Secondary | ICD-10-CM | POA: Diagnosis not present

## 2019-01-09 DIAGNOSIS — Z8507 Personal history of malignant neoplasm of pancreas: Secondary | ICD-10-CM | POA: Diagnosis not present

## 2019-01-09 DIAGNOSIS — K580 Irritable bowel syndrome with diarrhea: Secondary | ICD-10-CM | POA: Diagnosis not present

## 2019-01-09 DIAGNOSIS — K219 Gastro-esophageal reflux disease without esophagitis: Secondary | ICD-10-CM | POA: Diagnosis not present

## 2019-01-09 DIAGNOSIS — G894 Chronic pain syndrome: Secondary | ICD-10-CM | POA: Diagnosis not present

## 2019-01-17 ENCOUNTER — Encounter: Payer: Self-pay | Admitting: Oncology

## 2019-01-17 ENCOUNTER — Other Ambulatory Visit: Payer: Medicare Other

## 2019-01-17 LAB — FECAL OCCULT BLOOD, IMMUNOCHEMICAL: Fecal Occult Bld: NEGATIVE

## 2019-01-17 NOTE — Progress Notes (Unsigned)
Fob

## 2019-01-21 ENCOUNTER — Telehealth: Payer: Self-pay

## 2019-01-21 NOTE — Telephone Encounter (Signed)
Spoke with son regarding in basket msg reporting continuation of diarrhea. [name] states pt is "sometimes" taking immodium" and pt did try dairy free diet recommended by PCP but it was not very helpful.  RN advised [name] that immodium will be more effective if taken regularly as directed. Son states pt continues to be weak and have diarrhea.  He is concerned pt needs assistance at home but she is currently refusing his suggestions to get help.  He states he has also offered to for her to come live with him but she declines at this time. RN offered support and suggested pt to f/u with PCP for possible HH aide, PT, etc. Per Dr [name] pt should f/u with GI for management of diarrhea.  RN suggested that son ask GI if probiotics might be helpful. Sept 8th appt confirmed.  RN advised son to call back if he feels pt needs to be seen sooner.

## 2019-01-23 ENCOUNTER — Other Ambulatory Visit: Payer: Self-pay | Admitting: Family Medicine

## 2019-01-27 ENCOUNTER — Other Ambulatory Visit: Payer: Self-pay | Admitting: Family Medicine

## 2019-01-29 NOTE — Telephone Encounter (Signed)
Last fill 10/30/18  #90/0 Last OV 01/04/19

## 2019-02-01 ENCOUNTER — Telehealth: Payer: Self-pay | Admitting: Nurse Practitioner

## 2019-02-01 NOTE — Telephone Encounter (Signed)
I talk with son regarding video visit

## 2019-02-03 ENCOUNTER — Other Ambulatory Visit: Payer: Self-pay | Admitting: Family Medicine

## 2019-02-05 ENCOUNTER — Inpatient Hospital Stay: Payer: Medicare Other | Admitting: Nurse Practitioner

## 2019-02-05 ENCOUNTER — Telehealth: Payer: Self-pay | Admitting: Oncology

## 2019-02-05 ENCOUNTER — Other Ambulatory Visit: Payer: Self-pay

## 2019-02-05 ENCOUNTER — Encounter: Payer: Self-pay | Admitting: Oncology

## 2019-02-05 NOTE — Telephone Encounter (Signed)
I left a message regarding reschedule of video visit

## 2019-02-06 ENCOUNTER — Encounter: Payer: Self-pay | Admitting: Oncology

## 2019-02-06 ENCOUNTER — Inpatient Hospital Stay: Payer: Medicare Other | Attending: Oncology | Admitting: Oncology

## 2019-02-06 DIAGNOSIS — C250 Malignant neoplasm of head of pancreas: Secondary | ICD-10-CM

## 2019-02-06 DIAGNOSIS — I2510 Atherosclerotic heart disease of native coronary artery without angina pectoris: Secondary | ICD-10-CM

## 2019-02-06 NOTE — Progress Notes (Signed)
[place] OFFICE VISIT PROGRESS NOTE  I connected with [name] on 02/06/19 at  8:15 AM EDT by video and verified that I am speaking with the correct person using two identifiers.   I discussed the limitations, risks, security and privacy concerns of performing an evaluation and management service by telemedicine and the availability of in-person appointments. I also discussed with the patient that there may be a patient responsible charge related to this service. The patient expressed understanding and agreed to proceed.  Other persons participating in the visit and their role in the encounter: Son  Patient's location: Home Provider's location: Office    Diagnosis: Pancreas cancer  INTERVAL HISTORY:   [name] is seen today for a video visit.  This is secondary to distancing with the [place] pandemic.  She complains of persistent diarrhea.  She has diarrhea most days, unrelieved with Imodium.  The diarrhea has been present for several months.  She had occasional episodes of diarrhea in previous years, but more consistent diarrhea since completing abdominal radiation. She has undergone an evaluation by [name] and Dr. Oletta Lamas.  This included a negative stool C. difficile toxin and negative stool culture last month.  She was treated for a urinary tract infection last month.  Imodium does not relieve the diarrhea.  The Creon dose was increased by Dr. Oletta Lamas and this has not helped.  She reports stable chronic back pain.  She has "headaches ".  She had nausea this morning.  She has chronic exertional dyspnea.  [name] feels her weight is stable. Lab Results:  Lab Results  Component Value Date   WBC 5.6 01/04/2019   HGB 12.9 01/04/2019   HCT 36.2 01/04/2019   MCV 95.8 01/04/2019   PLT 139 (L) 01/04/2019   NEUTROABS 4,301 01/04/2019     Medications: I have reviewed the patient's current  medications.  Assessment/Plan: 1. 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  CT abdomen/pelvis 01/19/2015 with no evidence of recurrent/metastatic disease.  CT abdomen/pelvis 07/20/2018-infiltrating soft tissue at the pancreas resection bed with involvement of the SMA, occlusion of the SMV with collaterals. New L2 compression fracture  SBRT beginning 08/21/2018, completed 08/31/2018, 5 fractions  CT 12/03/2018-no change in abnormal soft tissue encasing the SMA  2. Nausea following the Whipple procedure-improved 3. Mild thrombocytopenia , Chronic 4.Pain secondary to local recurrence of pancreas cancer 5.  Diarrhea-etiology unclear    Disposition: [name] has a history of pancreas cancer.  She underwent SBRT to retroperitoneal soft tissue in the spring 2020.  Her symptoms do not suggest progression of pancreas cancer.  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  I will prescribe Lomotil to see if this helps.  I recommended she follow-up with Dr. Oletta Lamas to discuss the indication for additional GI evaluation such as an endoscopy.  [name] will be scheduled for a video office visit in approximately 1 month.   I discussed the assessment and treatment plan with the patient. The patient was provided an opportunity to ask questions and all were answered. The patient agreed with the plan and demonstrated an understanding of the instructions.   The patient was advised to call back  or seek an in-person evaluation if the symptoms worsen or if the condition fails to improve as anticipated.  I provided 25 minutes of video,  documentation, and chart review time during this encounter, and > 50% was spent counseling as documented under my assessment & plan.  Betsy Coder ANP/GNP-BC   02/06/2019 8:20 AM

## 2019-02-07 ENCOUNTER — Encounter: Payer: Self-pay | Admitting: Oncology

## 2019-02-08 ENCOUNTER — Encounter: Payer: Self-pay | Admitting: Oncology

## 2019-02-08 ENCOUNTER — Other Ambulatory Visit: Payer: Self-pay | Admitting: *Deleted

## 2019-02-08 ENCOUNTER — Other Ambulatory Visit: Payer: Self-pay | Admitting: Nurse Practitioner

## 2019-02-08 DIAGNOSIS — C250 Malignant neoplasm of head of pancreas: Secondary | ICD-10-CM

## 2019-02-08 MED ORDER — LOPERAMIDE HCL 2 MG PO CAPS: 2.0000 mg | ORAL_CAPSULE | ORAL | 0 refills | Status: DC | PRN

## 2019-02-08 MED ORDER — DIPHENOXYLATE-ATROPINE 2.5-0.025 MG PO TABS: 1.0000 | ORAL_TABLET | Freq: Four times a day (QID) | ORAL | 0 refills | Status: DC | PRN

## 2019-02-08 MED ORDER — HYDROCODONE-ACETAMINOPHEN 5-325 MG PO TABS: 1.0000 | ORAL_TABLET | Freq: Four times a day (QID) | ORAL | 0 refills | Status: DC | PRN

## 2019-02-09 ENCOUNTER — Other Ambulatory Visit: Payer: Self-pay | Admitting: Family Medicine

## 2019-02-11 ENCOUNTER — Other Ambulatory Visit: Payer: Self-pay | Admitting: Family Medicine

## 2019-02-14 ENCOUNTER — Other Ambulatory Visit: Payer: Self-pay

## 2019-02-14 ENCOUNTER — Ambulatory Visit (INDEPENDENT_AMBULATORY_CARE_PROVIDER_SITE_OTHER): Payer: Medicare Other

## 2019-02-14 DIAGNOSIS — Z0000 Encounter for general adult medical examination without abnormal findings: Secondary | ICD-10-CM | POA: Diagnosis not present

## 2019-02-14 NOTE — Patient Instructions (Signed)
[name] , Thank you for taking time to come for your Medicare Wellness Visit. I appreciate your ongoing commitment to your health goals. Please review the following plan we discussed and let me know if I can assist you in the future.   Screening recommendations/referrals: Colorectal Screening: completed 11/18/14 Mammogram: not indicated  Bone Density: not indicated   Vision and Dental Exams: Recommended annual ophthalmology exams for early detection of glaucoma and other disorders of the eye Recommended annual dental exams for proper oral hygiene  Vaccinations: Influenza vaccine:  recommended this fall either at PCP office or through your local pharmacy  Pneumococcal vaccine: up to date; last 09/02/14 Tdap vaccine: up to date; last 11/01/16   Shingles vaccine: Please call your insurance company to determine your out of pocket expense for the Shingrix vaccine. You may receive this vaccine at your local pharmacy. (see attached information)   Advanced directives: Advance directives discussed with you today. I have provided a copy for you to complete at home and have notarized. Once this is complete please bring a copy in to our office so we can scan it into your chart.  Goals: Recommend to drink at least 6-8 8oz glasses of water per day and work on changing diet to improve diarrhea.    Next appointment: Please schedule your Annual Wellness Visit with your Nurse Health Advisor in one year.  Preventive Care 40 Years and Older, Female Preventive care refers to lifestyle choices and visits with your health care provider that can promote health and wellness. What does preventive care include?  A yearly physical exam. This is also called an annual well check.  Dental exams once or twice a year.  Routine eye exams. Ask your health care provider how often you should have your eyes checked.  Personal lifestyle choices, including:  Daily care of your teeth and gums.  Regular physical activity.   Eating a healthy diet.  Avoiding tobacco and drug use.  Limiting alcohol use.  Practicing safe sex.  Taking low-dose aspirin every day if recommended by your health care provider.  Taking vitamin and mineral supplements as recommended by your health care provider. What happens during an annual well check? The services and screenings done by your health care provider during your annual well check will depend on your age, overall health, lifestyle risk factors, and family history of disease. Counseling  Your health care provider may ask you questions about your:  Alcohol use.  Tobacco use.  Drug use.  Emotional well-being.  Home and relationship well-being.  Sexual activity.  Eating habits.  History of falls.  Memory and ability to understand (cognition).  Work and work Statistician.  Reproductive health. Screening  You may have the following tests or measurements:  Height, weight, and BMI.  Blood pressure.  Lipid and cholesterol levels. These may be checked every 5 years, or more frequently if you are over 10 years old.  Skin check.  Lung cancer screening. You may have this screening every year starting at age 78 if you have a 30-pack-year history of smoking and currently smoke or have quit within the past 15 years.  Fecal occult blood test (FOBT) of the stool. You may have this test every year starting at age 33.  Flexible sigmoidoscopy or colonoscopy. You may have a sigmoidoscopy every 5 years or a colonoscopy every 10 years starting at age 9.  Hepatitis C blood test.  Hepatitis B blood test.  Sexually transmitted disease (STD) testing.  Diabetes screening.  This is done by checking your blood sugar (glucose) after you have not eaten for a while (fasting). You may have this done every 1-3 years.  Bone density scan. This is done to screen for osteoporosis. You may have this done starting at age 84.  Mammogram. This may be done every 1-2 years. Talk to  your health care provider about how often you should have regular mammograms. Talk with your health care provider about your test results, treatment options, and if necessary, the need for more tests. Vaccines  Your health care provider may recommend certain vaccines, such as:  Influenza vaccine. This is recommended every year.  Tetanus, diphtheria, and acellular pertussis (Tdap, Td) vaccine. You may need a Td booster every 10 years.  Zoster vaccine. You may need this after age 49.  Pneumococcal 13-valent conjugate (PCV13) vaccine. One dose is recommended after age 58.  Pneumococcal polysaccharide (PPSV23) vaccine. One dose is recommended after age 82. Talk to your health care provider about which screenings and vaccines you need and how often you need them. This information is not intended to replace advice given to you by your health care provider. Make sure you discuss any questions you have with your health care provider. Document Released: 06/12/2015 Document Revised: 02/03/2016 Document Reviewed: 03/17/2015 Elsevier Interactive Patient Education  2017 [employer] Prevention in the Home Falls can cause injuries. They can happen to people of all ages. There are many things you can do to make your home safe and to help prevent falls. What can I do on the outside of my home?  Regularly fix the edges of walkways and driveways and fix any cracks.  Remove anything that might make you trip as you walk through a door, such as a raised step or threshold.  Trim any bushes or trees on the path to your home.  Use bright outdoor lighting.  Clear any walking paths of anything that might make someone trip, such as rocks or tools.  Regularly check to see if handrails are loose or broken. Make sure that both sides of any steps have handrails.  Any raised decks and porches should have guardrails on the edges.  Have any leaves, snow, or ice cleared regularly.  Use sand or salt on  walking paths during winter.  Clean up any spills in your garage right away. This includes oil or grease spills. What can I do in the bathroom?  Use night lights.  Install grab bars by the toilet and in the tub and shower. Do not use towel bars as grab bars.  Use non-skid mats or decals in the tub or shower.  If you need to sit down in the shower, use a plastic, non-slip stool.  Keep the floor dry. Clean up any water that spills on the floor as soon as it happens.  Remove soap buildup in the tub or shower regularly.  Attach bath mats securely with double-sided non-slip rug tape.  Do not have throw rugs and other things on the floor that can make you trip. What can I do in the bedroom?  Use night lights.  Make sure that you have a light by your bed that is easy to reach.  Do not use any sheets or blankets that are too big for your bed. They should not hang down onto the floor.  Have a firm chair that has side arms. You can use this for support while you get dressed.  Do not have throw rugs and  other things on the floor that can make you trip. What can I do in the kitchen?  Clean up any spills right away.  Avoid walking on wet floors.  Keep items that you use a lot in easy-to-reach places.  If you need to reach something above you, use a strong step stool that has a grab bar.  Keep electrical cords out of the way.  Do not use floor polish or wax that makes floors slippery. If you must use wax, use non-skid floor wax.  Do not have throw rugs and other things on the floor that can make you trip. What can I do with my stairs?  Do not leave any items on the stairs.  Make sure that there are handrails on both sides of the stairs and use them. Fix handrails that are broken or loose. Make sure that handrails are as long as the stairways.  Check any carpeting to make sure that it is firmly attached to the stairs. Fix any carpet that is loose or worn.  Avoid having throw  rugs at the top or bottom of the stairs. If you do have throw rugs, attach them to the floor with carpet tape.  Make sure that you have a light switch at the top of the stairs and the bottom of the stairs. If you do not have them, ask someone to add them for you. What else can I do to help prevent falls?  Wear shoes that:  Do not have high heels.  Have rubber bottoms.  Are comfortable and fit you well.  Are closed at the toe. Do not wear sandals.  If you use a stepladder:  Make sure that it is fully opened. Do not climb a closed stepladder.  Make sure that both sides of the stepladder are locked into place.  Ask someone to hold it for you, if possible.  Clearly mark and make sure that you can see:  Any grab bars or handrails.  First and last steps.  Where the edge of each step is.  Use tools that help you move around (mobility aids) if they are needed. These include:  Canes.  Walkers.  Scooters.  Crutches.  Turn on the lights when you go into a dark area. Replace any light bulbs as soon as they burn out.  Set up your furniture so you have a clear path. Avoid moving your furniture around.  If any of your floors are uneven, fix them.  If there are any pets around you, be aware of where they are.  Review your medicines with your doctor. Some medicines can make you feel dizzy. This can increase your chance of falling. Ask your doctor what other things that you can do to help prevent falls. This information is not intended to replace advice given to you by your health care provider. Make sure you discuss any questions you have with your health care provider. Document Released: 03/12/2009 Document Revised: 10/22/2015 Document Reviewed: 06/20/2014 Elsevier Interactive Patient Education  2017 [employer].
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
## 2019-02-15 ENCOUNTER — Other Ambulatory Visit: Payer: Self-pay | Admitting: Family Medicine

## 2019-02-15 NOTE — Progress Notes (Signed)
Called pts son to schedule, he stated he would call and schedule appt with Juleen China after pt sees GI.

## 2019-02-15 NOTE — Progress Notes (Signed)
Please schedule patient a virtual visit to discuss her health concerns addressed during her visit with the health coach.

## 2019-02-22 DIAGNOSIS — G8929 Other chronic pain: Secondary | ICD-10-CM | POA: Diagnosis not present

## 2019-02-22 DIAGNOSIS — K8689 Other specified diseases of pancreas: Secondary | ICD-10-CM | POA: Diagnosis not present

## 2019-02-22 DIAGNOSIS — C259 Malignant neoplasm of pancreas, unspecified: Secondary | ICD-10-CM | POA: Diagnosis not present

## 2019-02-22 DIAGNOSIS — R197 Diarrhea, unspecified: Secondary | ICD-10-CM | POA: Diagnosis not present

## 2019-02-28 ENCOUNTER — Ambulatory Visit: Payer: Medicare Other | Admitting: Oncology

## 2019-03-06 ENCOUNTER — Other Ambulatory Visit: Payer: Self-pay | Admitting: Gastroenterology

## 2019-03-18 ENCOUNTER — Other Ambulatory Visit (HOSPITAL_COMMUNITY)
Admission: RE | Admit: 2019-03-18 | Discharge: 2019-03-18 | Disposition: A | Discharge status: Home / Self Care | Payer: Medicare Other | Source: Ambulatory Visit | Attending: Gastroenterology | Admitting: Gastroenterology

## 2019-03-18 DIAGNOSIS — F0390 Unspecified dementia without behavioral disturbance: Secondary | ICD-10-CM | POA: Diagnosis present

## 2019-03-18 DIAGNOSIS — M1990 Unspecified osteoarthritis, unspecified site: Secondary | ICD-10-CM | POA: Diagnosis present

## 2019-03-18 DIAGNOSIS — K760 Fatty (change of) liver, not elsewhere classified: Secondary | ICD-10-CM | POA: Diagnosis present

## 2019-03-18 DIAGNOSIS — E876 Hypokalemia: Secondary | ICD-10-CM | POA: Diagnosis present

## 2019-03-18 DIAGNOSIS — Y92009 Unspecified place in unspecified non-institutional (private) residence as the place of occurrence of the external cause: Secondary | ICD-10-CM | POA: Diagnosis not present

## 2019-03-18 DIAGNOSIS — R42 Dizziness and giddiness: Secondary | ICD-10-CM | POA: Diagnosis present

## 2019-03-18 DIAGNOSIS — E8770 Fluid overload, unspecified: Secondary | ICD-10-CM | POA: Diagnosis not present

## 2019-03-18 DIAGNOSIS — W19XXXA Unspecified fall, initial encounter: Secondary | ICD-10-CM | POA: Diagnosis present

## 2019-03-18 DIAGNOSIS — E860 Dehydration: Secondary | ICD-10-CM | POA: Diagnosis present

## 2019-03-18 DIAGNOSIS — K5730 Diverticulosis of large intestine without perforation or abscess without bleeding: Secondary | ICD-10-CM | POA: Diagnosis present

## 2019-03-18 DIAGNOSIS — I341 Nonrheumatic mitral (valve) prolapse: Secondary | ICD-10-CM | POA: Diagnosis present

## 2019-03-18 DIAGNOSIS — G629 Polyneuropathy, unspecified: Secondary | ICD-10-CM | POA: Diagnosis present

## 2019-03-18 DIAGNOSIS — A071 Giardiasis [lambliasis]: Secondary | ICD-10-CM | POA: Diagnosis present

## 2019-03-18 DIAGNOSIS — F329 Major depressive disorder, single episode, unspecified: Secondary | ICD-10-CM | POA: Diagnosis present

## 2019-03-18 DIAGNOSIS — E890 Postprocedural hypothyroidism: Secondary | ICD-10-CM | POA: Diagnosis present

## 2019-03-18 DIAGNOSIS — H409 Unspecified glaucoma: Secondary | ICD-10-CM | POA: Diagnosis present

## 2019-03-18 DIAGNOSIS — Z23 Encounter for immunization: Secondary | ICD-10-CM | POA: Diagnosis not present

## 2019-03-18 DIAGNOSIS — Z20828 Contact with and (suspected) exposure to other viral communicable diseases: Secondary | ICD-10-CM | POA: Diagnosis present

## 2019-03-18 DIAGNOSIS — I10 Essential (primary) hypertension: Secondary | ICD-10-CM | POA: Diagnosis present

## 2019-03-18 DIAGNOSIS — N179 Acute kidney failure, unspecified: Secondary | ICD-10-CM | POA: Diagnosis present

## 2019-03-18 DIAGNOSIS — G8929 Other chronic pain: Secondary | ICD-10-CM | POA: Diagnosis present

## 2019-03-18 DIAGNOSIS — C250 Malignant neoplasm of head of pancreas: Secondary | ICD-10-CM | POA: Diagnosis present

## 2019-03-18 DIAGNOSIS — K219 Gastro-esophageal reflux disease without esophagitis: Secondary | ICD-10-CM | POA: Diagnosis present

## 2019-03-18 DIAGNOSIS — Z01812 Encounter for preprocedural laboratory examination: Secondary | ICD-10-CM | POA: Insufficient documentation

## 2019-03-18 DIAGNOSIS — N390 Urinary tract infection, site not specified: Secondary | ICD-10-CM | POA: Diagnosis present

## 2019-03-18 DIAGNOSIS — J320 Chronic maxillary sinusitis: Secondary | ICD-10-CM | POA: Diagnosis present

## 2019-03-18 DIAGNOSIS — R296 Repeated falls: Secondary | ICD-10-CM | POA: Diagnosis present

## 2019-03-18 DIAGNOSIS — K228 Other specified diseases of esophagus: Secondary | ICD-10-CM | POA: Diagnosis present

## 2019-03-19 ENCOUNTER — Emergency Department (HOSPITAL_COMMUNITY): Payer: Medicare Other

## 2019-03-19 ENCOUNTER — Ambulatory Visit: Payer: Medicare Other | Admitting: Physician Assistant

## 2019-03-19 ENCOUNTER — Inpatient Hospital Stay (HOSPITAL_COMMUNITY)
Admission: EM | Admit: 2019-03-19 | Discharge: 2019-03-31 | DRG: 372 | Disposition: A | Discharge status: Home Health | Payer: Medicare Other | Attending: Family Medicine | Admitting: Family Medicine

## 2019-03-19 ENCOUNTER — Other Ambulatory Visit: Payer: Self-pay

## 2019-03-19 ENCOUNTER — Encounter (HOSPITAL_COMMUNITY): Payer: Self-pay | Admitting: *Deleted

## 2019-03-19 ENCOUNTER — Ambulatory Visit: Payer: Self-pay | Admitting: *Deleted

## 2019-03-19 ENCOUNTER — Telehealth: Payer: Self-pay | Admitting: Cardiovascular Disease

## 2019-03-19 DIAGNOSIS — Z87440 Personal history of urinary (tract) infections: Secondary | ICD-10-CM

## 2019-03-19 DIAGNOSIS — K219 Gastro-esophageal reflux disease without esophagitis: Secondary | ICD-10-CM | POA: Diagnosis present

## 2019-03-19 DIAGNOSIS — R197 Diarrhea, unspecified: Secondary | ICD-10-CM | POA: Diagnosis not present

## 2019-03-19 DIAGNOSIS — Z79899 Other long term (current) drug therapy: Secondary | ICD-10-CM

## 2019-03-19 DIAGNOSIS — B961 Klebsiella pneumoniae [K. pneumoniae] as the cause of diseases classified elsewhere: Secondary | ICD-10-CM | POA: Diagnosis present

## 2019-03-19 DIAGNOSIS — C250 Malignant neoplasm of head of pancreas: Secondary | ICD-10-CM | POA: Diagnosis present

## 2019-03-19 DIAGNOSIS — R109 Unspecified abdominal pain: Secondary | ICD-10-CM

## 2019-03-19 DIAGNOSIS — K5730 Diverticulosis of large intestine without perforation or abscess without bleeding: Secondary | ICD-10-CM | POA: Diagnosis present

## 2019-03-19 DIAGNOSIS — Z882 Allergy status to sulfonamides status: Secondary | ICD-10-CM

## 2019-03-19 DIAGNOSIS — E876 Hypokalemia: Secondary | ICD-10-CM | POA: Diagnosis present

## 2019-03-19 DIAGNOSIS — Z934 Other artificial openings of gastrointestinal tract status: Secondary | ICD-10-CM

## 2019-03-19 DIAGNOSIS — G8929 Other chronic pain: Secondary | ICD-10-CM | POA: Diagnosis present

## 2019-03-19 DIAGNOSIS — G629 Polyneuropathy, unspecified: Secondary | ICD-10-CM | POA: Diagnosis present

## 2019-03-19 DIAGNOSIS — Z90411 Acquired partial absence of pancreas: Secondary | ICD-10-CM

## 2019-03-19 DIAGNOSIS — R410 Disorientation, unspecified: Secondary | ICD-10-CM | POA: Diagnosis present

## 2019-03-19 DIAGNOSIS — Z820 Family history of epilepsy and other diseases of the nervous system: Secondary | ICD-10-CM

## 2019-03-19 DIAGNOSIS — A071 Giardiasis [lambliasis]: Principal | ICD-10-CM | POA: Diagnosis present

## 2019-03-19 DIAGNOSIS — M1990 Unspecified osteoarthritis, unspecified site: Secondary | ICD-10-CM | POA: Diagnosis present

## 2019-03-19 DIAGNOSIS — J320 Chronic maxillary sinusitis: Secondary | ICD-10-CM | POA: Diagnosis present

## 2019-03-19 DIAGNOSIS — L509 Urticaria, unspecified: Secondary | ICD-10-CM | POA: Diagnosis present

## 2019-03-19 DIAGNOSIS — E860 Dehydration: Secondary | ICD-10-CM | POA: Diagnosis present

## 2019-03-19 DIAGNOSIS — N390 Urinary tract infection, site not specified: Secondary | ICD-10-CM | POA: Diagnosis present

## 2019-03-19 DIAGNOSIS — K529 Noninfective gastroenteritis and colitis, unspecified: Secondary | ICD-10-CM | POA: Diagnosis present

## 2019-03-19 DIAGNOSIS — Z79891 Long term (current) use of opiate analgesic: Secondary | ICD-10-CM

## 2019-03-19 DIAGNOSIS — Z8507 Personal history of malignant neoplasm of pancreas: Secondary | ICD-10-CM

## 2019-03-19 DIAGNOSIS — I10 Essential (primary) hypertension: Secondary | ICD-10-CM | POA: Diagnosis not present

## 2019-03-19 DIAGNOSIS — H409 Unspecified glaucoma: Secondary | ICD-10-CM | POA: Diagnosis present

## 2019-03-19 DIAGNOSIS — R296 Repeated falls: Secondary | ICD-10-CM | POA: Diagnosis present

## 2019-03-19 DIAGNOSIS — E785 Hyperlipidemia, unspecified: Secondary | ICD-10-CM | POA: Diagnosis present

## 2019-03-19 DIAGNOSIS — Z90710 Acquired absence of both cervix and uterus: Secondary | ICD-10-CM

## 2019-03-19 DIAGNOSIS — N179 Acute kidney failure, unspecified: Secondary | ICD-10-CM | POA: Diagnosis present

## 2019-03-19 DIAGNOSIS — R0602 Shortness of breath: Secondary | ICD-10-CM

## 2019-03-19 DIAGNOSIS — Z888 Allergy status to other drugs, medicaments and biological substances status: Secondary | ICD-10-CM

## 2019-03-19 DIAGNOSIS — Z803 Family history of malignant neoplasm of breast: Secondary | ICD-10-CM

## 2019-03-19 DIAGNOSIS — Y92009 Unspecified place in unspecified non-institutional (private) residence as the place of occurrence of the external cause: Secondary | ICD-10-CM

## 2019-03-19 DIAGNOSIS — F329 Major depressive disorder, single episode, unspecified: Secondary | ICD-10-CM | POA: Diagnosis present

## 2019-03-19 DIAGNOSIS — K760 Fatty (change of) liver, not elsewhere classified: Secondary | ICD-10-CM | POA: Diagnosis present

## 2019-03-19 DIAGNOSIS — F0390 Unspecified dementia without behavioral disturbance: Secondary | ICD-10-CM | POA: Diagnosis present

## 2019-03-19 DIAGNOSIS — E039 Hypothyroidism, unspecified: Secondary | ICD-10-CM | POA: Diagnosis present

## 2019-03-19 DIAGNOSIS — D696 Thrombocytopenia, unspecified: Secondary | ICD-10-CM | POA: Diagnosis present

## 2019-03-19 DIAGNOSIS — Z20828 Contact with and (suspected) exposure to other viral communicable diseases: Secondary | ICD-10-CM | POA: Diagnosis present

## 2019-03-19 DIAGNOSIS — W19XXXA Unspecified fall, initial encounter: Secondary | ICD-10-CM

## 2019-03-19 DIAGNOSIS — E8770 Fluid overload, unspecified: Secondary | ICD-10-CM | POA: Diagnosis not present

## 2019-03-19 DIAGNOSIS — Z23 Encounter for immunization: Secondary | ICD-10-CM

## 2019-03-19 DIAGNOSIS — K228 Other specified diseases of esophagus: Secondary | ICD-10-CM | POA: Diagnosis present

## 2019-03-19 DIAGNOSIS — Z8673 Personal history of transient ischemic attack (TIA), and cerebral infarction without residual deficits: Secondary | ICD-10-CM

## 2019-03-19 DIAGNOSIS — I341 Nonrheumatic mitral (valve) prolapse: Secondary | ICD-10-CM | POA: Diagnosis present

## 2019-03-19 DIAGNOSIS — E890 Postprocedural hypothyroidism: Secondary | ICD-10-CM | POA: Diagnosis present

## 2019-03-19 DIAGNOSIS — Z9049 Acquired absence of other specified parts of digestive tract: Secondary | ICD-10-CM

## 2019-03-19 DIAGNOSIS — Z7982 Long term (current) use of aspirin: Secondary | ICD-10-CM

## 2019-03-19 LAB — CBC
HCT: 37.6 % (ref 36.0–46.0)
Hemoglobin: 12.1 g/dL (ref 12.0–15.0)
MCH: 32 pg (ref 26.0–34.0)
MCHC: 32.2 g/dL (ref 30.0–36.0)
MCV: 99.5 fL (ref 80.0–100.0)
Platelets: 217 10*3/uL (ref 150–400)
RBC: 3.78 MIL/uL — ABNORMAL LOW (ref 3.87–5.11)
RDW: 14.6 % (ref 11.5–15.5)
WBC: 7.9 10*3/uL (ref 4.0–10.5)
nRBC: 0 % (ref 0.0–0.2)

## 2019-03-19 LAB — URINALYSIS, ROUTINE W REFLEX MICROSCOPIC
Bilirubin Urine: NEGATIVE
Glucose, UA: NEGATIVE mg/dL
Hgb urine dipstick: NEGATIVE
Ketones, ur: NEGATIVE mg/dL
Nitrite: NEGATIVE
Protein, ur: NEGATIVE mg/dL
Specific Gravity, Urine: 1.006 (ref 1.005–1.030)
pH: 5 (ref 5.0–8.0)

## 2019-03-19 LAB — BASIC METABOLIC PANEL
Anion gap: 13 (ref 5–15)
BUN: 34 mg/dL — ABNORMAL HIGH (ref 8–23)
CO2: 22 mmol/L (ref 22–32)
Calcium: 8.2 mg/dL — ABNORMAL LOW (ref 8.9–10.3)
Chloride: 104 mmol/L (ref 98–111)
Creatinine, Ser: 1.54 mg/dL — ABNORMAL HIGH (ref 0.44–1.00)
GFR calc Af Amer: 37 mL/min — ABNORMAL LOW (ref >60)
GFR calc non Af Amer: 32 mL/min — ABNORMAL LOW (ref >60)
Glucose, Bld: 118 mg/dL — ABNORMAL HIGH (ref 70–99)
Potassium: 2.9 mmol/L — ABNORMAL LOW (ref 3.5–5.1)
Sodium: 139 mmol/L (ref 135–145)

## 2019-03-19 LAB — NOVEL CORONAVIRUS, NAA (HOSP ORDER, SEND-OUT TO REF LAB; TAT 18-24 HRS): SARS-CoV-2, NAA: NOT DETECTED

## 2019-03-19 MED ORDER — SODIUM CHLORIDE 0.9 % IV BOLUS
1000.0000 mL | Freq: Once | INTRAVENOUS | Status: AC
  Administered 2019-03-19: 20:00:00 1000 mL via INTRAVENOUS

## 2019-03-19 MED ORDER — POTASSIUM CHLORIDE 10 MEQ/100ML IV SOLN
10.0000 meq | INTRAVENOUS | Status: AC
  Administered 2019-03-19 (×2): 10 meq via INTRAVENOUS
  Filled 2019-03-19 (×2): qty 100

## 2019-03-19 MED ORDER — METRONIDAZOLE IN NACL 5-0.79 MG/ML-% IV SOLN
500.0000 mg | Freq: Once | INTRAVENOUS | Status: AC
  Administered 2019-03-20: 500 mg via INTRAVENOUS
  Filled 2019-03-19: qty 100

## 2019-03-19 MED ORDER — SODIUM CHLORIDE 0.9% FLUSH: 3.0000 mL | Freq: Once | INTRAVENOUS | Status: DC

## 2019-03-19 MED ORDER — IOHEXOL 300 MG/ML  SOLN
75.0000 mL | Freq: Once | INTRAMUSCULAR | Status: AC | PRN
  Administered 2019-03-19: 23:00:00 75 mL via INTRAVENOUS

## 2019-03-19 MED ORDER — SODIUM CHLORIDE (PF) 0.9 % IJ SOLN
INTRAMUSCULAR | Status: AC
  Administered 2019-03-20: 04:00:00
  Filled 2019-03-19: qty 50

## 2019-03-19 MED ORDER — CIPROFLOXACIN IN D5W 400 MG/200ML IV SOLN
400.0000 mg | Freq: Once | INTRAVENOUS | Status: AC
  Administered 2019-03-20: 400 mg via INTRAVENOUS
  Filled 2019-03-19: qty 200

## 2019-03-19 MED ORDER — POTASSIUM CHLORIDE CRYS ER 20 MEQ PO TBCR
40.0000 meq | EXTENDED_RELEASE_TABLET | Freq: Once | ORAL | Status: AC
  Administered 2019-03-19: 40 meq via ORAL
  Filled 2019-03-19: qty 2

## 2019-03-19 NOTE — ED Triage Notes (Signed)
Pt states she bent over to pick up clothes became dizzy, fell in living then again a little later. Diarrhea 2-3 times a day for 6 months, states her bp this morning was 60/30. In Triage wnl. Pt skips around with listing symptoms.

## 2019-03-19 NOTE — Telephone Encounter (Signed)
Spoke with son who just spoke with Dr Loistine Chance office and was suggested patient go to ED. Patient has had diarrhea for about 3 months several times a day. She has been having dizziness and didn't take her medications several days end of last week, son not sure why. Blood pressure currently 111/61 but patient is still dizzy/lightheaded. Advised son agreed with ED given recent UTI, continued diarrhea, dizziness, and low blood pressure. Son stated he would take mother to ED
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
## 2019-03-19 NOTE — Telephone Encounter (Signed)
See note

## 2019-03-19 NOTE — ED Notes (Signed)
ED provider at bedside.
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
## 2019-03-19 NOTE — Telephone Encounter (Signed)
Son is calling for his mom with c/o dizziness. Which the patient and son states that it is like being off balanced. They denied the room spinning.  She uses a walker and a wheel chair to get around. And is dizzy while she is sitting  No fever, chest pain, vomiting. She is short of breath when she tries to do things but that is her baseline.  The patient has demential.  She is drinking plenty of fluids.  She also has diarrhea that has been going on for months. She was tested for covid-19 yesterday and she is negative.  Requesting an appointment for the dizziness. [facility] [place] notified of an appointment. Call conference in with the patient.    Reason for Disposition . [1] MODERATE dizziness (e.g., interferes with normal activities) AND [2] has been evaluated by physician for this  Answer Assessment - Initial Assessment Questions 1. DESCRIPTION: "Describe your dizziness."     Off balanced 2. LIGHTHEADED: "Do you feel lightheaded?" (e.g., somewhat faint, woozy, weak upon standing)     yes 3. VERTIGO: "Do you feel like either you or the room is spinning or tilting?" (i.e. vertigo)     no 4. SEVERITY: "How bad is it?"  "Do you feel like you are going to faint?" "Can you stand and walk?"   - MILD - walking normally   - MODERATE - interferes with normal activities (e.g., work, school)    - SEVERE - unable to stand, requires support to walk, feels like passing out now.      Has fallen and uses a walker and now a wheel chari 5. ONSET:  "When did the dizziness begin?"     For a whiloe 6. AGGRAVATING FACTORS: "Does anything make it worse?" (e.g., standing, change in head position)     no 7. HEART RATE: "Can you tell me your heart rate?" "How many beats in 15 seconds?"  (Note: not all patients can do this)       Can not 8. CAUSE: "What do you think is causing the dizziness?"     Not sure 9. RECURRENT SYMPTOM: "Have you had dizziness before?" If so, ask: "When was the last time?" "What  happened that time?"     Been going on for a while 10. OTHER SYMPTOMS: "Do you have any other symptoms?" (e.g., fever, chest pain, vomiting, diarrhea, bleeding)       no 11. PREGNANCY: "Is there any chance you are pregnant?" "When was your last menstrual period?"       n/a  Protocols used: DIZZINESS [facility]

## 2019-03-19 NOTE — ED Provider Notes (Signed)
  Face-to-face evaluation   History: Patient presents with ongoing trouble, with multiple systems including dizziness, diarrhea, confusion and difficulty talking.  More recently for about 5 days she has complained of a dizziness with standing but also occasionally has dizziness at rest.  She has a sensation of hotness when she urinates.  She has been eating well.  She lives alone, and is here with her son.  Her son states she fell twice in the last 48 hours, without significant injury.  Physical exam: Alert elderly female who is cooperative.  Abdomen is soft with mild diffuse tenderness.  No respiratory distress.   Medical screening examination/treatment/procedure(s) were conducted as a shared visit with non-physician practitioner(s) and myself.  I personally evaluated the patient during the encounter    Daleen Bo, MD 03/20/19 1149

## 2019-03-20 DIAGNOSIS — E8770 Fluid overload, unspecified: Secondary | ICD-10-CM | POA: Diagnosis not present

## 2019-03-20 DIAGNOSIS — R197 Diarrhea, unspecified: Secondary | ICD-10-CM | POA: Diagnosis present

## 2019-03-20 DIAGNOSIS — K439 Ventral hernia without obstruction or gangrene: Secondary | ICD-10-CM | POA: Diagnosis not present

## 2019-03-20 DIAGNOSIS — K635 Polyp of colon: Secondary | ICD-10-CM | POA: Diagnosis not present

## 2019-03-20 DIAGNOSIS — E876 Hypokalemia: Secondary | ICD-10-CM | POA: Diagnosis present

## 2019-03-20 DIAGNOSIS — K8689 Other specified diseases of pancreas: Secondary | ICD-10-CM | POA: Diagnosis not present

## 2019-03-20 DIAGNOSIS — G8929 Other chronic pain: Secondary | ICD-10-CM | POA: Diagnosis present

## 2019-03-20 DIAGNOSIS — K5730 Diverticulosis of large intestine without perforation or abscess without bleeding: Secondary | ICD-10-CM | POA: Diagnosis present

## 2019-03-20 DIAGNOSIS — C259 Malignant neoplasm of pancreas, unspecified: Secondary | ICD-10-CM | POA: Diagnosis not present

## 2019-03-20 DIAGNOSIS — W19XXXA Unspecified fall, initial encounter: Secondary | ICD-10-CM | POA: Diagnosis not present

## 2019-03-20 DIAGNOSIS — E890 Postprocedural hypothyroidism: Secondary | ICD-10-CM | POA: Diagnosis present

## 2019-03-20 DIAGNOSIS — K449 Diaphragmatic hernia without obstruction or gangrene: Secondary | ICD-10-CM | POA: Diagnosis not present

## 2019-03-20 DIAGNOSIS — E860 Dehydration: Secondary | ICD-10-CM | POA: Diagnosis present

## 2019-03-20 DIAGNOSIS — N179 Acute kidney failure, unspecified: Secondary | ICD-10-CM | POA: Diagnosis present

## 2019-03-20 DIAGNOSIS — K529 Noninfective gastroenteritis and colitis, unspecified: Secondary | ICD-10-CM | POA: Diagnosis not present

## 2019-03-20 DIAGNOSIS — R109 Unspecified abdominal pain: Secondary | ICD-10-CM | POA: Diagnosis not present

## 2019-03-20 DIAGNOSIS — R0602 Shortness of breath: Secondary | ICD-10-CM | POA: Diagnosis not present

## 2019-03-20 DIAGNOSIS — Z20828 Contact with and (suspected) exposure to other viral communicable diseases: Secondary | ICD-10-CM | POA: Diagnosis present

## 2019-03-20 DIAGNOSIS — H409 Unspecified glaucoma: Secondary | ICD-10-CM | POA: Diagnosis present

## 2019-03-20 DIAGNOSIS — C250 Malignant neoplasm of head of pancreas: Secondary | ICD-10-CM | POA: Diagnosis present

## 2019-03-20 DIAGNOSIS — M1990 Unspecified osteoarthritis, unspecified site: Secondary | ICD-10-CM | POA: Diagnosis present

## 2019-03-20 DIAGNOSIS — I10 Essential (primary) hypertension: Secondary | ICD-10-CM | POA: Diagnosis present

## 2019-03-20 DIAGNOSIS — J320 Chronic maxillary sinusitis: Secondary | ICD-10-CM | POA: Diagnosis present

## 2019-03-20 DIAGNOSIS — R05 Cough: Secondary | ICD-10-CM | POA: Diagnosis not present

## 2019-03-20 DIAGNOSIS — K228 Other specified diseases of esophagus: Secondary | ICD-10-CM | POA: Diagnosis present

## 2019-03-20 DIAGNOSIS — I341 Nonrheumatic mitral (valve) prolapse: Secondary | ICD-10-CM | POA: Diagnosis present

## 2019-03-20 DIAGNOSIS — Y92009 Unspecified place in unspecified non-institutional (private) residence as the place of occurrence of the external cause: Secondary | ICD-10-CM

## 2019-03-20 DIAGNOSIS — E039 Hypothyroidism, unspecified: Secondary | ICD-10-CM | POA: Diagnosis not present

## 2019-03-20 DIAGNOSIS — G629 Polyneuropathy, unspecified: Secondary | ICD-10-CM | POA: Diagnosis present

## 2019-03-20 DIAGNOSIS — R42 Dizziness and giddiness: Secondary | ICD-10-CM | POA: Diagnosis not present

## 2019-03-20 DIAGNOSIS — D649 Anemia, unspecified: Secondary | ICD-10-CM | POA: Diagnosis not present

## 2019-03-20 DIAGNOSIS — F329 Major depressive disorder, single episode, unspecified: Secondary | ICD-10-CM | POA: Diagnosis present

## 2019-03-20 DIAGNOSIS — A071 Giardiasis [lambliasis]: Secondary | ICD-10-CM | POA: Diagnosis present

## 2019-03-20 DIAGNOSIS — Z23 Encounter for immunization: Secondary | ICD-10-CM | POA: Diagnosis present

## 2019-03-20 DIAGNOSIS — K760 Fatty (change of) liver, not elsewhere classified: Secondary | ICD-10-CM | POA: Diagnosis present

## 2019-03-20 DIAGNOSIS — N390 Urinary tract infection, site not specified: Secondary | ICD-10-CM | POA: Diagnosis present

## 2019-03-20 DIAGNOSIS — R296 Repeated falls: Secondary | ICD-10-CM | POA: Diagnosis present

## 2019-03-20 DIAGNOSIS — K219 Gastro-esophageal reflux disease without esophagitis: Secondary | ICD-10-CM | POA: Diagnosis present

## 2019-03-20 DIAGNOSIS — F0390 Unspecified dementia without behavioral disturbance: Secondary | ICD-10-CM | POA: Diagnosis present

## 2019-03-20 LAB — CBC
HCT: 38.8 % (ref 36.0–46.0)
Hemoglobin: 12.3 g/dL (ref 12.0–15.0)
MCH: 31.8 pg (ref 26.0–34.0)
MCHC: 31.7 g/dL (ref 30.0–36.0)
MCV: 100.3 fL — ABNORMAL HIGH (ref 80.0–100.0)
Platelets: 186 10*3/uL (ref 150–400)
RBC: 3.87 MIL/uL (ref 3.87–5.11)
RDW: 14.6 % (ref 11.5–15.5)
WBC: 7.2 10*3/uL (ref 4.0–10.5)
nRBC: 0 % (ref 0.0–0.2)

## 2019-03-20 LAB — COMPREHENSIVE METABOLIC PANEL
ALT: 36 U/L (ref 0–44)
AST: 33 U/L (ref 15–41)
Albumin: 3.9 g/dL (ref 3.5–5.0)
Alkaline Phosphatase: 151 U/L — ABNORMAL HIGH (ref 38–126)
Anion gap: 16 — ABNORMAL HIGH (ref 5–15)
BUN: 30 mg/dL — ABNORMAL HIGH (ref 8–23)
CO2: 20 mmol/L — ABNORMAL LOW (ref 22–32)
Calcium: 8 mg/dL — ABNORMAL LOW (ref 8.9–10.3)
Chloride: 105 mmol/L (ref 98–111)
Creatinine, Ser: 1.13 mg/dL — ABNORMAL HIGH (ref 0.44–1.00)
GFR calc Af Amer: 54 mL/min — ABNORMAL LOW (ref >60)
GFR calc non Af Amer: 46 mL/min — ABNORMAL LOW (ref >60)
Glucose, Bld: 91 mg/dL (ref 70–99)
Potassium: 3.3 mmol/L — ABNORMAL LOW (ref 3.5–5.1)
Sodium: 141 mmol/L (ref 135–145)
Total Bilirubin: 0.9 mg/dL (ref 0.3–1.2)
Total Protein: 6.5 g/dL (ref 6.5–8.1)

## 2019-03-20 LAB — SARS CORONAVIRUS 2 (TAT 6-24 HRS): SARS Coronavirus 2: NEGATIVE

## 2019-03-20 MED ORDER — DIPHENOXYLATE-ATROPINE 2.5-0.025 MG PO TABS
1.0000 | ORAL_TABLET | Freq: Four times a day (QID) | ORAL | Status: DC | PRN
  Administered 2019-03-20: 2 via ORAL
  Filled 2019-03-20: qty 2

## 2019-03-20 MED ORDER — AMLODIPINE BESYLATE 5 MG PO TABS
5.0000 mg | ORAL_TABLET | Freq: Every day | ORAL | Status: DC
  Administered 2019-03-20 – 2019-03-26 (×7): 5 mg via ORAL
  Filled 2019-03-20 (×6): qty 1

## 2019-03-20 MED ORDER — PRAVASTATIN SODIUM 40 MG PO TABS
40.0000 mg | ORAL_TABLET | Freq: Every day | ORAL | Status: DC
  Administered 2019-03-20 – 2019-03-31 (×11): 40 mg via ORAL
  Filled 2019-03-20 (×3): qty 1
  Filled 2019-03-20 (×3): qty 2
  Filled 2019-03-20: qty 1
  Filled 2019-03-20: qty 2
  Filled 2019-03-20: qty 1
  Filled 2019-03-20 (×3): qty 2

## 2019-03-20 MED ORDER — DULOXETINE HCL 60 MG PO CPEP
60.0000 mg | ORAL_CAPSULE | Freq: Two times a day (BID) | ORAL | Status: DC
  Administered 2019-03-20 – 2019-03-28 (×16): 60 mg via ORAL
  Filled 2019-03-20 (×16): qty 1

## 2019-03-20 MED ORDER — POTASSIUM CHLORIDE ER 10 MEQ PO TBCR
10.0000 meq | EXTENDED_RELEASE_TABLET | Freq: Two times a day (BID) | ORAL | Status: DC
  Administered 2019-03-20 – 2019-03-24 (×9): 10 meq via ORAL
  Filled 2019-03-20 (×20): qty 1

## 2019-03-20 MED ORDER — METRONIDAZOLE IN NACL 5-0.79 MG/ML-% IV SOLN
500.0000 mg | Freq: Three times a day (TID) | INTRAVENOUS | Status: DC
  Administered 2019-03-20 – 2019-03-21 (×4): 500 mg via INTRAVENOUS
  Filled 2019-03-20 (×5): qty 100

## 2019-03-20 MED ORDER — ONDANSETRON HCL 4 MG PO TABS: 4.0000 mg | ORAL_TABLET | Freq: Four times a day (QID) | ORAL | Status: DC | PRN

## 2019-03-20 MED ORDER — ROPINIROLE HCL 1 MG PO TABS
0.5000 mg | ORAL_TABLET | Freq: Every day | ORAL | Status: DC
  Administered 2019-03-20 – 2019-03-30 (×11): 0.5 mg via ORAL
  Filled 2019-03-20 (×11): qty 1

## 2019-03-20 MED ORDER — LATANOPROST 0.005 % OP SOLN
1.0000 [drp] | Freq: Every day | OPHTHALMIC | Status: DC
  Administered 2019-03-20 – 2019-03-30 (×10): 1 [drp] via OPHTHALMIC
  Filled 2019-03-20: qty 2.5

## 2019-03-20 MED ORDER — POLYVINYL ALCOHOL 1.4 % OP SOLN
1.0000 [drp] | Freq: Two times a day (BID) | OPHTHALMIC | Status: DC
  Administered 2019-03-20 – 2019-03-31 (×21): 1 [drp] via OPHTHALMIC
  Filled 2019-03-20 (×2): qty 15

## 2019-03-20 MED ORDER — TRIAMCINOLONE ACETONIDE 0.5 % EX OINT
1.0000 "application " | TOPICAL_OINTMENT | Freq: Two times a day (BID) | CUTANEOUS | Status: DC | PRN
  Filled 2019-03-20 (×2): qty 15

## 2019-03-20 MED ORDER — VITAMIN D 25 MCG (1000 UNIT) PO TABS
1000.0000 [IU] | ORAL_TABLET | Freq: Two times a day (BID) | ORAL | Status: DC
  Administered 2019-03-20 – 2019-03-31 (×21): 1000 [IU] via ORAL
  Filled 2019-03-20 (×21): qty 1

## 2019-03-20 MED ORDER — ATENOLOL 50 MG PO TABS
50.0000 mg | ORAL_TABLET | Freq: Two times a day (BID) | ORAL | Status: DC
  Administered 2019-03-20 – 2019-03-31 (×22): 50 mg via ORAL
  Filled 2019-03-20 (×22): qty 1

## 2019-03-20 MED ORDER — CALCIUM CARBONATE-VITAMIN D 500-200 MG-UNIT PO TABS
1.0000 | ORAL_TABLET | Freq: Every day | ORAL | Status: DC
  Administered 2019-03-20 – 2019-03-31 (×11): 1 via ORAL
  Filled 2019-03-20 (×11): qty 1

## 2019-03-20 MED ORDER — ASPIRIN EC 81 MG PO TBEC
81.0000 mg | DELAYED_RELEASE_TABLET | Freq: Every day | ORAL | Status: DC
  Administered 2019-03-20 – 2019-03-31 (×12): 81 mg via ORAL
  Filled 2019-03-20 (×12): qty 1

## 2019-03-20 MED ORDER — POLYETHYLENE GLYCOL 3350 17 G PO PACK
17.0000 g | PACK | Freq: Three times a day (TID) | ORAL | Status: DC
  Administered 2019-03-20 – 2019-03-21 (×3): 17 g via ORAL
  Filled 2019-03-20 (×3): qty 1

## 2019-03-20 MED ORDER — BUSPIRONE HCL 5 MG PO TABS
5.0000 mg | ORAL_TABLET | Freq: Two times a day (BID) | ORAL | Status: DC
  Administered 2019-03-20 – 2019-03-31 (×22): 5 mg via ORAL
  Filled 2019-03-20 (×22): qty 1

## 2019-03-20 MED ORDER — PREGABALIN 100 MG PO CAPS
300.0000 mg | ORAL_CAPSULE | Freq: Two times a day (BID) | ORAL | Status: DC
  Administered 2019-03-20 – 2019-03-28 (×17): 300 mg via ORAL
  Filled 2019-03-20 (×15): qty 3
  Filled 2019-03-20: qty 6
  Filled 2019-03-20: qty 3

## 2019-03-20 MED ORDER — FESOTERODINE FUMARATE ER 4 MG PO TB24
4.0000 mg | ORAL_TABLET | Freq: Every day | ORAL | Status: DC
  Administered 2019-03-22 – 2019-03-31 (×10): 4 mg via ORAL
  Filled 2019-03-20 (×12): qty 1

## 2019-03-20 MED ORDER — ONDANSETRON HCL 4 MG PO TABS: 8.0000 mg | ORAL_TABLET | Freq: Three times a day (TID) | ORAL | Status: DC | PRN

## 2019-03-20 MED ORDER — LOPERAMIDE HCL 2 MG PO CAPS
2.0000 mg | ORAL_CAPSULE | ORAL | Status: DC | PRN
  Administered 2019-03-20 – 2019-03-27 (×4): 2 mg via ORAL
  Filled 2019-03-20 (×4): qty 1

## 2019-03-20 MED ORDER — HEPARIN SODIUM (PORCINE) 5000 UNIT/ML IJ SOLN
5000.0000 [IU] | Freq: Three times a day (TID) | INTRAMUSCULAR | Status: DC
  Administered 2019-03-20 – 2019-03-31 (×29): 5000 [IU] via SUBCUTANEOUS
  Filled 2019-03-20 (×32): qty 1

## 2019-03-20 MED ORDER — PANCRELIPASE (LIP-PROT-AMYL) 12000-38000 UNITS PO CPEP
24000.0000 [IU] | ORAL_CAPSULE | Freq: Three times a day (TID) | ORAL | Status: DC
  Administered 2019-03-20 – 2019-03-31 (×31): 24000 [IU] via ORAL
  Filled 2019-03-20 (×31): qty 2

## 2019-03-20 MED ORDER — SENNOSIDES-DOCUSATE SODIUM 8.6-50 MG PO TABS
1.0000 | ORAL_TABLET | Freq: Every day | ORAL | Status: DC
  Filled 2019-03-20: qty 1

## 2019-03-20 MED ORDER — BETAMETHASONE DIPROPIONATE 0.05 % EX LOTN: 1.0000 "application " | TOPICAL_LOTION | Freq: Every day | CUTANEOUS | Status: DC | PRN

## 2019-03-20 MED ORDER — COENZYME Q10 300 MG PO CAPS: 300.0000 mg | ORAL_CAPSULE | Freq: Every day | ORAL | Status: DC

## 2019-03-20 MED ORDER — PANTOPRAZOLE SODIUM 40 MG PO TBEC
40.0000 mg | DELAYED_RELEASE_TABLET | Freq: Every day | ORAL | Status: DC
  Administered 2019-03-20: 09:00:00 40 mg via ORAL
  Filled 2019-03-20: qty 1

## 2019-03-20 MED ORDER — KCL IN DEXTROSE-NACL 20-5-0.9 MEQ/L-%-% IV SOLN
INTRAVENOUS | Status: DC
  Administered 2019-03-20 – 2019-03-24 (×7): via INTRAVENOUS
  Filled 2019-03-20 (×11): qty 1000

## 2019-03-20 MED ORDER — VITAMIN C 500 MG PO TABS
1000.0000 mg | ORAL_TABLET | Freq: Every day | ORAL | Status: DC
  Administered 2019-03-20: 1000 mg via ORAL
  Filled 2019-03-20: qty 2

## 2019-03-20 MED ORDER — HYDROCODONE-ACETAMINOPHEN 5-325 MG PO TABS
1.0000 | ORAL_TABLET | Freq: Four times a day (QID) | ORAL | Status: DC | PRN
  Administered 2019-03-20 – 2019-03-31 (×14): 1 via ORAL
  Filled 2019-03-20 (×14): qty 1

## 2019-03-20 MED ORDER — INFLUENZA VAC A&B SA ADJ QUAD 0.5 ML IM PRSY
0.5000 mL | PREFILLED_SYRINGE | INTRAMUSCULAR | Status: DC
  Filled 2019-03-20 (×2): qty 0.5

## 2019-03-20 MED ORDER — SODIUM CHLORIDE 0.9 % IV SOLN
2.0000 g | Freq: Every day | INTRAVENOUS | Status: DC
  Administered 2019-03-20 – 2019-03-21 (×3): 2 g via INTRAVENOUS
  Filled 2019-03-20 (×3): qty 2

## 2019-03-20 MED ORDER — SIMETHICONE 80 MG PO CHEW
80.0000 mg | CHEWABLE_TABLET | Freq: Four times a day (QID) | ORAL | Status: DC | PRN
  Administered 2019-03-22: 80 mg via ORAL
  Filled 2019-03-20: qty 1

## 2019-03-20 MED ORDER — LORATADINE 10 MG PO TABS
10.0000 mg | ORAL_TABLET | Freq: Every day | ORAL | Status: DC
  Administered 2019-03-20: 09:00:00 10 mg via ORAL
  Filled 2019-03-20: qty 1

## 2019-03-20 MED ORDER — LEVOTHYROXINE SODIUM 75 MCG PO TABS
175.0000 ug | ORAL_TABLET | Freq: Every day | ORAL | Status: DC
  Administered 2019-03-20 – 2019-03-31 (×10): 175 ug via ORAL
  Filled 2019-03-20 (×12): qty 1

## 2019-03-20 MED ORDER — PNEUMOCOCCAL VAC POLYVALENT 25 MCG/0.5ML IJ INJ
0.5000 mL | INJECTION | INTRAMUSCULAR | Status: AC
  Administered 2019-03-22: 0.5 mL via INTRAMUSCULAR
  Filled 2019-03-20: qty 0.5

## 2019-03-20 MED ORDER — ONDANSETRON HCL 4 MG/2ML IJ SOLN
4.0000 mg | Freq: Four times a day (QID) | INTRAMUSCULAR | Status: DC | PRN
  Administered 2019-03-23 – 2019-03-26 (×3): 4 mg via INTRAVENOUS
  Filled 2019-03-20 (×4): qty 2

## 2019-03-20 NOTE — ED Notes (Signed)
Assisted Pt to bedside toilet. Pt stated she felt dizzy but not as much as before. Pt able to stand with stand by assistance

## 2019-03-20 NOTE — Progress Notes (Signed)
[place] OF CARE NOTE Patient: [name] [id number]   PCP: Briscoe Deutscher, DO DOB: 07/08/1940   DOA: 03/19/2019   DOS: 03/20/2019    Patient was admitted by my colleague Dr. Jonelle Sidle earlier on 03/20/2019. I have reviewed the H&P as well as assessment and plan and agree with the same. Important changes in the plan are listed below.  Plan of care: Principal Problem:   Acute colitis Active Problems:   Hypothyroidism, on Levothryoxine   Essential hypertension   GERD, on Prilosec   Osteoarthritis, mulitple sites   Carcinoma of head of pancreas ([place]), followed by Dr [name], Oncology   HLD (hyperlipidemia), on Lovastatin   Diarrhea   Fall at home, initial encounter   Hypokalemia   Acute renal failure (ARF) ([place]) Consider [facility] for further assistance and input. Discontinue Protonix discontinue stool softener. GI considering EGD colonoscopy sigmoidoscopy tomorrow. GI also recommending further GI pathogen panel as well as C. difficile work-up.   Author: Berle Mull, MD [facility] 03/20/2019 5:48 PM   If 7PM-7AM, please contact night-coverage at [email or web address]
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
## 2019-03-20 NOTE — Progress Notes (Signed)
PHARMACIST - PHYSICIAN ORDER COMMUNICATION  CONCERNING: P&T Medication Policy on Herbal Medications  DESCRIPTION:  This patient's order for:  Co-enzyme Q10 has been noted.  This product(s) is classified as an "herbal" or natural product. Due to a lack of definitive safety studies or FDA approval, nonstandard manufacturing practices, plus the potential risk of unknown drug-drug interactions while on inpatient medications, the Pharmacy and Therapeutics Committee does not permit the use of "herbal" or natural products of this type within [place].   ACTION TAKEN: The pharmacy department is unable to verify this order at this time and your patient has been informed of this safety policy. Please reevaluate patient's clinical condition at discharge and address if the herbal or natural product(s) should be resumed at that time.   
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
## 2019-03-21 ENCOUNTER — Inpatient Hospital Stay (HOSPITAL_COMMUNITY): Payer: Medicare Other | Admitting: Certified Registered"

## 2019-03-21 ENCOUNTER — Encounter (HOSPITAL_COMMUNITY)
Admission: EM | Disposition: A | Discharge status: Home Health | Payer: Self-pay | Source: Home / Self Care | Attending: Internal Medicine

## 2019-03-21 ENCOUNTER — Encounter (HOSPITAL_COMMUNITY): Payer: Self-pay

## 2019-03-21 ENCOUNTER — Ambulatory Visit (HOSPITAL_COMMUNITY): Admission: RE | Admit: 2019-03-21 | Payer: Medicare Other | Source: Home / Self Care | Admitting: Gastroenterology

## 2019-03-21 ENCOUNTER — Encounter (HOSPITAL_COMMUNITY): Admission: RE | Payer: Self-pay | Source: Home / Self Care

## 2019-03-21 DIAGNOSIS — C250 Malignant neoplasm of head of pancreas: Secondary | ICD-10-CM | POA: Diagnosis not present

## 2019-03-21 DIAGNOSIS — A071 Giardiasis [lambliasis]: Secondary | ICD-10-CM | POA: Diagnosis not present

## 2019-03-21 DIAGNOSIS — K529 Noninfective gastroenteritis and colitis, unspecified: Secondary | ICD-10-CM | POA: Diagnosis not present

## 2019-03-21 DIAGNOSIS — N179 Acute kidney failure, unspecified: Secondary | ICD-10-CM | POA: Diagnosis not present

## 2019-03-21 DIAGNOSIS — Z23 Encounter for immunization: Secondary | ICD-10-CM | POA: Diagnosis not present

## 2019-03-21 HISTORY — PX: BIOPSY: SHX5522

## 2019-03-21 HISTORY — PX: ESOPHAGOGASTRODUODENOSCOPY: SHX5428

## 2019-03-21 HISTORY — PX: FLEXIBLE SIGMOIDOSCOPY: SHX5431

## 2019-03-21 LAB — COMPREHENSIVE METABOLIC PANEL
ALT: 31 U/L (ref 0–44)
AST: 35 U/L (ref 15–41)
Albumin: 3 g/dL — ABNORMAL LOW (ref 3.5–5.0)
Alkaline Phosphatase: 126 U/L (ref 38–126)
Anion gap: 9 (ref 5–15)
BUN: 11 mg/dL (ref 8–23)
CO2: 20 mmol/L — ABNORMAL LOW (ref 22–32)
Calcium: 7.8 mg/dL — ABNORMAL LOW (ref 8.9–10.3)
Chloride: 111 mmol/L (ref 98–111)
Creatinine, Ser: 0.72 mg/dL (ref 0.44–1.00)
GFR calc Af Amer: >60 mL/min (ref >60)
GFR calc non Af Amer: >60 mL/min (ref >60)
Glucose, Bld: 113 mg/dL — ABNORMAL HIGH (ref 70–99)
Potassium: 3.8 mmol/L (ref 3.5–5.1)
Sodium: 140 mmol/L (ref 135–145)
Total Bilirubin: 0.5 mg/dL (ref 0.3–1.2)
Total Protein: 5.6 g/dL — ABNORMAL LOW (ref 6.5–8.1)

## 2019-03-21 LAB — CBC WITH DIFFERENTIAL/PLATELET
Abs Immature Granulocytes: 0.02 10*3/uL (ref 0.00–0.07)
Basophils Absolute: 0 10*3/uL (ref 0.0–0.1)
Basophils Relative: 1 %
Eosinophils Absolute: 0.4 10*3/uL (ref 0.0–0.5)
Eosinophils Relative: 7 %
HCT: 33 % — ABNORMAL LOW (ref 36.0–46.0)
Hemoglobin: 10.4 g/dL — ABNORMAL LOW (ref 12.0–15.0)
Immature Granulocytes: 0 %
Lymphocytes Relative: 19 %
Lymphs Abs: 1 10*3/uL (ref 0.7–4.0)
MCH: 32.2 pg (ref 26.0–34.0)
MCHC: 31.5 g/dL (ref 30.0–36.0)
MCV: 102.2 fL — ABNORMAL HIGH (ref 80.0–100.0)
Monocytes Absolute: 0.4 10*3/uL (ref 0.1–1.0)
Monocytes Relative: 8 %
Neutro Abs: 3.4 10*3/uL (ref 1.7–7.7)
Neutrophils Relative %: 65 %
Platelets: 137 10*3/uL — ABNORMAL LOW (ref 150–400)
RBC: 3.23 MIL/uL — ABNORMAL LOW (ref 3.87–5.11)
RDW: 14.6 % (ref 11.5–15.5)
WBC: 5.2 10*3/uL (ref 4.0–10.5)
nRBC: 0 % (ref 0.0–0.2)

## 2019-03-21 LAB — URINE CULTURE: Culture: >=100000 — AB

## 2019-03-21 LAB — C DIFFICILE QUICK SCREEN W PCR REFLEX
C Diff antigen: NEGATIVE
C Diff interpretation: NOT DETECTED
C Diff toxin: NEGATIVE

## 2019-03-21 LAB — MAGNESIUM: Magnesium: 1.5 mg/dL — ABNORMAL LOW (ref 1.7–2.4)

## 2019-03-21 LAB — LACTIC ACID, PLASMA: Lactic Acid, Venous: 2 mmol/L (ref 0.5–1.9)

## 2019-03-21 SURGERY — COLONOSCOPY WITH PROPOFOL
Anesthesia: Monitor Anesthesia Care

## 2019-03-21 SURGERY — EGD (ESOPHAGOGASTRODUODENOSCOPY)
Anesthesia: Monitor Anesthesia Care

## 2019-03-21 MED ORDER — PROPOFOL 10 MG/ML IV BOLUS
INTRAVENOUS | Status: DC | PRN
  Administered 2019-03-21: 30 mg via INTRAVENOUS

## 2019-03-21 MED ORDER — PROPOFOL 500 MG/50ML IV EMUL
INTRAVENOUS | Status: DC | PRN
  Administered 2019-03-21: 100 ug/kg/min via INTRAVENOUS

## 2019-03-21 MED ORDER — SODIUM CHLORIDE 0.9 % IV BOLUS
500.0000 mL | Freq: Once | INTRAVENOUS | Status: AC
  Administered 2019-03-21: 05:00:00 500 mL via INTRAVENOUS

## 2019-03-21 MED ORDER — MAGNESIUM SULFATE 2 GM/50ML IV SOLN
2.0000 g | Freq: Once | INTRAVENOUS | Status: AC
  Administered 2019-03-21: 09:00:00 2 g via INTRAVENOUS
  Filled 2019-03-21: qty 50

## 2019-03-21 MED ORDER — SODIUM CHLORIDE 0.9 % IV SOLN: INTRAVENOUS | Status: DC

## 2019-03-21 MED ORDER — LACTATED RINGERS IV SOLN
INTRAVENOUS | Status: DC | PRN
  Administered 2019-03-21: 12:00:00 via INTRAVENOUS

## 2019-03-21 MED ORDER — METRONIDAZOLE IN NACL 5-0.79 MG/ML-% IV SOLN
500.0000 mg | Freq: Three times a day (TID) | INTRAVENOUS | Status: DC
  Administered 2019-03-21 – 2019-03-22 (×3): 500 mg via INTRAVENOUS
  Filled 2019-03-21 (×2): qty 100

## 2019-03-21 NOTE — Op Note (Signed)
[facility] Patient Name: [name] Procedure Date: 03/21/2019 MRN: [id number] Attending MD: Nancy Fetter Dr., MD Date of Birth: Jul 07, 1940 CSN: [id number] Age: 78 Admit Type: Outpatient Procedure:                Upper GI endoscopy Indications:              Nausea, past Whipple for pancreatic cancer Providers:                Jeneen Rinks L. Kaliope Quinonez Dr., MD, Cleda Daub, RN, Marguerita Merles, Technician, Lazaro Arms, Technician, Ashley Jacobs, RN Referring MD:              Medicines:                Monitored Anesthesia Care Complications:            No immediate complications. Estimated Blood Loss:     Estimated blood loss: none. Procedure:                Pre-Anesthesia Assessment:                           - Prior to the procedure, a History and Physical                            was performed, and patient medications and                            allergies were reviewed. The patient's tolerance of                            previous anesthesia was also reviewed. The risks                            and benefits of the procedure and the sedation                            options and risks were discussed with the patient.                            All questions were answered, and informed consent                            was obtained. Prior Anticoagulants: The patient has                            taken no previous anticoagulant or antiplatelet                            agents. ASA Grade Assessment: III - A patient with  severe systemic disease. After reviewing the risks                            and benefits, the patient was deemed in                            satisfactory condition to undergo the procedure.                           After obtaining informed consent, the endoscope was                            passed under direct vision. Throughout the   procedure, the patient's blood pressure, pulse, and                            oxygen saturations were monitored continuously. The                            [id number]-H190 ([id number]) Olympus gastroscope was                            introduced through the mouth, and advanced to the                            efferent jejunal loop. The upper GI endoscopy was                            accomplished without difficulty. The patient                            tolerated the procedure well. Scope In: Scope Out: Findings:      The esophagus was normal.      Diffuse erythematous mucosa without bleeding was found in the entire       examined stomach.      Evidence of a patent Billroth II gastrojejunostomy was found. The       gastrojejunal anastomosis was characterized by healthy appearing mucosa.       The efferent limb was examined. The afferent limb was examined. Impression:               - Normal esophagus.                           - Erythematous mucosa in the stomach.                           - Patent Billroth II gastrojejunostomy was found,                            characterized by healthy appearing mucosa.                           - No specimens collected. Moderate Sedation:      MAC by anesthesia Recommendation:           - Patient has a contact  number available for                            emergencies. The signs and symptoms of potential                            delayed complications were discussed with the                            patient. Return to normal activities tomorrow.                            Written discharge instructions were provided to the                            patient.                           - Resume previous diet.                           - See the other procedure note for documentation of                            additional recommendations. Procedure Code(s):        --- Professional ---                           [phone], Esophagogastroduodenoscopy,  flexible,                            transoral; diagnostic, including collection of                            specimen(s) by brushing or washing, when performed                            (separate procedure) Diagnosis Code(s):        --- Professional ---                           R11.0, Nausea                           Z98.0, Intestinal bypass and anastomosis status                           K31.89, Other diseases of stomach and duodenum CPT copyright 2019 [employer]. All rights reserved. The codes documented in this report are preliminary and upon coder review may  be revised to meet current compliance requirements. Nancy Fetter Dr., MD 03/21/2019 1:01:03 PM This report has been signed electronically. Number of Addenda: 0

## 2019-03-21 NOTE — Op Note (Signed)
[facility] Patient Name: [name] Procedure Date: 03/21/2019 MRN: [id number] Attending MD: Nancy Fetter Dr., MD Date of Birth: 22-Sep-1940 CSN: [id number] Age: 78 Admit Type: Outpatient Procedure:                Flexible Sigmoidoscopy Indications:              Clinically significant diarrhea of unexplained                            origin Providers:                Jeneen Rinks L. Dhyana Bastone Dr., MD, Ashley Jacobs, RN, Marguerita Merles, Technician Referring MD:              Medicines:                Monitored Anesthesia Care Complications:            No immediate complications. Estimated Blood Loss:     Estimated blood loss: none. Procedure:                Pre-Anesthesia Assessment:                           - Prior to the procedure, a History and Physical                            was performed, and patient medications and                            allergies were reviewed. The patient's tolerance of                            previous anesthesia was also reviewed. The risks                            and benefits of the procedure and the sedation                            options and risks were discussed with the patient.                            All questions were answered, and informed consent                            was obtained. Prior Anticoagulants: The patient has                            taken no previous anticoagulant or antiplatelet                            agents. ASA Grade Assessment: III - A patient with                            severe systemic  disease. After reviewing the risks                            and benefits, the patient was deemed in                            satisfactory condition to undergo the procedure.                           - Prior to the procedure, a History and Physical                            was performed, and patient medications and                            allergies were reviewed. The  patient's tolerance of                            previous anesthesia was also reviewed. The risks                            and benefits of the procedure and the sedation                            options and risks were discussed with the patient.                            All questions were answered, and informed consent                            was obtained. Prior Anticoagulants: The patient has                            taken no previous anticoagulant or antiplatelet                            agents. ASA Grade Assessment: III - A patient with                            severe systemic disease. After reviewing the risks                            and benefits, the patient was deemed in                            satisfactory condition to undergo the procedure.                           After obtaining informed consent, the scope was                            passed under direct vision. The PCF-H190DL                            (  [id number]) Olympus pediatric colonscope was                            introduced through the anus and advanced to the the                            left transverse colon. The flexible sigmoidoscopy                            was accomplished without difficulty. The patient                            tolerated the procedure well. The quality of the                            bowel preparation was poor. Large amount of                            liquid/soft stool. Scope In: 12:29:36 PM Scope Out: 12:49:34 PM Total Procedure Duration: 0 hours 19 minutes 58 seconds  Findings:      The perianal and digital rectal examinations were normal.      A localized area of mildly congested mucosa was found in the transverse       colon. Biopsies were taken with a cold forceps for histology. Multiple       random biopsies placed in single jar      A moderate amount of stool was found in the entire colon. Fluid       aspiration for microbiology, bacterial cultures and  Clostridium       difficile was performed. Sent for GI path panel lactoferrin, cdiff etc. Impression:               - Preparation of the colon was poor.                           - Congested mucosa in the transverse colon.                            Biopsied.                           - Stool in the entire examined colon. Fluid                            aspiration performed.                           - Diarrhea Moderate Sedation:      MAC by anesthesia Recommendation:           - Continue present medications.                           - Return patient to hospital ward for ongoing care.                           - Resume regular diet. Procedure Code(s):        ---  Professional ---                           [phone], Sigmoidoscopy, flexible; with biopsy, single                            or multiple Diagnosis Code(s):        --- Professional ---                           R19.7, Diarrhea, unspecified                           K63.89, Other specified diseases of intestine CPT copyright 2019 American Medical Association. All rights reserved. The codes documented in this report are preliminary and upon coder review may  be revised to meet current compliance requirements. Nancy Fetter Dr., MD 03/21/2019 1:07:31 PM This report has been signed electronically. Number of Addenda: 0

## 2019-03-21 NOTE — Interval H&P Note (Signed)
History and Physical Interval Note:  03/21/2019 12:12 PM  [name]  has presented today for surgery, with the diagnosis of diarrhea.  The various methods of treatment have been discussed with the patient and family. After consideration of risks, benefits and other options for treatment, the patient has consented to  Procedure(s): ESOPHAGOGASTRODUODENOSCOPY (EGD) (N/A) FLEXIBLE SIGMOIDOSCOPY (N/A) as a surgical intervention.  The patient's history has been reviewed, patient examined, no change in status, stable for surgery.  I have reviewed the patient's chart and labs.  Questions were answered to the patient's satisfaction.     Nancy Fetter

## 2019-03-21 NOTE — Anesthesia Preprocedure Evaluation (Signed)
Anesthesia Evaluation  Patient identified by MRN, date of birth, ID band Patient awake    Reviewed: Allergy & Precautions, NPO status , Patient's Chart, lab work & pertinent test results, reviewed documented beta blocker date and time   Airway Mallampati: I  TM Distance: >3 FB     Dental no notable dental hx. (+) Teeth Intact   Pulmonary neg pulmonary ROS,    breath sounds clear to auscultation       Cardiovascular hypertension, Pt. on medications and Pt. on home beta blockers + Peripheral Vascular Disease  Normal cardiovascular exam+ Valvular Problems/Murmurs  Rhythm:Regular Rate:Normal     Neuro/Psych    GI/Hepatic Neg liver ROS, hiatal hernia, GERD  Medicated and Controlled,  Endo/Other  Hypothyroidism   Renal/GU      Musculoskeletal   Abdominal Normal abdominal exam  (+)   Peds  Hematology  (+) Blood dyscrasia, anemia ,   Anesthesia Other Findings   Reproductive/Obstetrics                             Anesthesia Physical  Anesthesia Plan  ASA: III  Anesthesia Plan: MAC   Post-op Pain Management:    Induction: Intravenous  PONV Risk Score and Plan: 3 and Treatment may vary due to age or medical condition, Ondansetron, Dexamethasone and Midazolam  Airway Management Planned: Nasal Cannula, Mask and Natural Airway  Additional Equipment:   Intra-op Plan:   Post-operative Plan:   Informed Consent: I have reviewed the patients History and Physical, chart, labs and discussed the procedure including the risks, benefits and alternatives for the proposed anesthesia with the patient or authorized representative who has indicated his/her understanding and acceptance.     Dental advisory given  Plan Discussed with: CRNA  Anesthesia Plan Comments:         Anesthesia Quick Evaluation

## 2019-03-21 NOTE — Progress Notes (Signed)
[facility] Progress Note  Patient: [name] [id number]   PCP: Briscoe Deutscher, DO DOB: [birth year]   DOA: 03/19/2019   DOS: 03/21/2019   Date of Service: the patient was seen and examined on 03/21/2019  Chief Complaint  Patient presents with  . Dizziness   Brief hospital course: [name] is a 78 y.o. 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  Patient also has worsening diarrhea with abdominal cramping.  She has had diarrhea over the last 3 months.  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  Patient has recently been treated with Cipro and Flagyl.  She was previously tested for C. difficile and was negative.  She has been dehydrated.  Still taking Lasix and hydrochlorothiazide.  She is also noted confusion difficulty talking occasionally.  Patient's dizziness is both at rest and when moving.  In the last 48 hours she has fallen.  Primarily son is with the patient giving more history.  She appears very dehydrated and chronically ill looking.  She has not been eating well apparently.  Patient is being admitted with severe dehydration secondary to poor oral intake.  Also colitis as seen on her CT.  Currently further plan is follow GI recommendation.  Subjective: no further BM no blood in the stool.  No nausea no vomiting.  No fever no chills.  Dizziness still present but less fatigue still present.  Assessment and Plan: #1 acute colitis:  Etiology not clear.  Extremities with gastroenterology consulted. Patient underwent endoscopy and sigmoidoscopy. Some stool sample was sent for further work-up. Patient will be advanced to low residue diet and will monitor. Currently on IV antibiotics  as well as IV fluid. Monitor.  #2 recurrent falls with dizziness:  Patient has gait abnormalities but this most recent falls could be related to dehydration and orthostasis.  Aggressively hydrate the patient and monitor We will get PT OT.  #3 hypokalemia:  Hypomagnesemia Most likely from persistent diarrhea. Continue to replace as as and when needed.  #4 acute kidney injury: Prerenal most likely from dehydration.  Aggressively hydrate and monitor renal function  #5 hypertension:  We will hold some blood pressure medicines especially diuretics.  Continue to monitor  #6 history of pancreatic cancer: Status post Whipple's procedure.  Continue follow-up with oncology  Diet: Clear liquid diet, advance as tolerated  DVT Prophylaxis: Subcutaneous Lovenox  Advance goals of care discussion: Full code  Family Communication: no family was present at bedside, at the time of interview.   Disposition:  Discharge to be determined.  Consultants: [facility]   Procedures: EGD and colonoscopy  Scheduled Meds: . amLODipine  5 mg Oral Daily  . aspirin EC  81 mg Oral Daily  . atenolol  50 mg Oral BID  . busPIRone  5 mg Oral BID  . calcium-vitamin D  1 tablet Oral Daily  . cholecalciferol  1,000 Units Oral BID  . DULoxetine  60 mg Oral BID  . fesoterodine  4 mg Oral Daily  . heparin  5,000 Units Subcutaneous Q8H  . influenza vaccine adjuvanted  0.5 mL Intramuscular Tomorrow-1000  . latanoprost  1 drop Both Eyes QHS  . levothyroxine  175 mcg Oral Q0600  . lipase/protease/amylase  24,000 Units Oral TID WC  . pneumococcal  23 valent vaccine  0.5 mL Intramuscular Tomorrow-1000  . polyethylene glycol  17 g Oral TID  . polyvinyl alcohol  1 drop Both Eyes BID  . potassium chloride  10 mEq Oral BID  . pravastatin  40 mg Oral q1800  . pregabalin  300 mg Oral BID  . rOPINIRole  0.5 mg Oral QHS  . sodium chloride flush  3 mL Intravenous Once   Continuous Infusions: . cefTRIAXone  (ROCEPHIN)  IV 2 g (03/20/19 2239)  . dextrose 5 % and 0.9 % NaCl with KCl 20 mEq/L 100 mL/hr at 03/21/19 0452  . metronidazole 500 mg (03/21/19 0908)   PRN Meds: betamethasone dipropionate, diphenoxylate-atropine, HYDROcodone-acetaminophen, loperamide, ondansetron **OR** ondansetron (ZOFRAN) IV, simethicone, triamcinolone ointment Antibiotics: Anti-infectives (From admission, onward)   Start     Dose/Rate Route Frequency Ordered Stop   03/20/19 0800  metroNIDAZOLE (FLAGYL) IVPB 500 mg     500 mg 100 mL/hr over 60 Minutes Intravenous Every 8 hours 03/20/19 0311     03/20/19 0330  cefTRIAXone (ROCEPHIN) 2 g in sodium chloride 0.9 % 100 mL IVPB     2 g 200 mL/hr over 30 Minutes Intravenous Daily at bedtime 03/20/19 0311     03/20/19 0000  ciprofloxacin (CIPRO) IVPB 400 mg     400 mg 200 mL/hr over 60 Minutes Intravenous  Once 03/19/19 2355 03/20/19 0512   03/20/19 0000  metroNIDAZOLE (FLAGYL) IVPB 500 mg     500 mg 100 mL/hr over 60 Minutes Intravenous  Once 03/19/19 2355 03/20/19 0245       Objective: Physical Exam: Vitals:   03/21/19 1322 03/21/19 1347 03/21/19 1445 03/21/19 1531  BP:  (!) 148/64 (!) 151/79 (!) 156/76  Pulse: 74 66 70 74  Resp: 13 14 16 16   Temp:  (!) 97.4 F (36.3 C)    TempSrc:  Oral    SpO2: 98% 95% 97%   Weight:      Height:        Intake/Output Summary (Last 24 hours) at 03/21/2019 1707 Last data filed at 03/21/2019 1400 Gross per 24 hour  Intake 2804.62 ml  Output 450 ml  Net 2354.62 ml   Filed Weights   03/19/19 1522 03/20/19 0302  Weight: 65.8 kg 63.8 kg   General: alert and oriented to time, place, and person. Appear in mild distress, affect appropriate Eyes: PERRL, Conjunctiva normal ENT: Oral Mucosa Clear, moist  Neck: no JVD, no Abnormal Mass Or lumps Cardiovascular: S1 and S2 Present, no Murmur, peripheral pulses symmetrical Respiratory: good respiratory effort, Bilateral Air entry equal and Decreased, no signs of accessory muscle  use, Clear to Auscultation, no Crackles, no wheezes Abdomen: Bowel Sound present, Soft and mild tenderness, no hernia Skin: no rashes  Extremities: no Pedal edema, no calf tenderness Neurologic: without any new focal findings Gait not checked due to patient safety concerns  Data Reviewed: I have personally reviewed and interpreted daily labs, tele strips, imagings as discussed above. I reviewed all nursing notes, pharmacy notes, vitals, pertinent old records I have discussed plan of care as described above with RN and patient/family.  CBC: Recent Labs  Lab 03/19/19 1616 03/20/19 0402 03/21/19 0323  WBC 7.9 7.2 5.2  NEUTROABS  --   --  3.4  HGB 12.1 12.3 10.4*  HCT 37.6 38.8 33.0*  MCV 99.5 100.3* 102.2*  PLT 217 186 0000000*   Basic Metabolic Panel: Recent Labs  Lab 03/19/19 1616 03/20/19 0402 03/21/19 0323  NA 139 141 140  K 2.9* 3.3* 3.8  CL 104 105 111  CO2 22 20* 20*  GLUCOSE 118* 91 113*  BUN 34* 30* 11  CREATININE 1.54* 1.13* 0.72  CALCIUM 8.2* 8.0* 7.8*  MG  --   --  1.5*    Liver Function Tests: Recent Labs  Lab 03/20/19 0402 03/21/19 0323  AST 33 35  ALT 36 31  ALKPHOS 151* 126  BILITOT 0.9 0.5  PROT 6.5 5.6*  ALBUMIN 3.9 3.0*   No results for input(s): LIPASE, AMYLASE in the last 168 hours. No results for input(s): AMMONIA in the last 168 hours. Coagulation Profile: No results for input(s): INR, PROTIME in the last 168 hours. Cardiac Enzymes: No results for input(s): CKTOTAL, CKMB, CKMBINDEX, TROPONINI in the last 168 hours. BNP (last 3 results) No results for input(s): PROBNP in the last 8760 hours. CBG: No results for input(s): GLUCAP in the last 168 hours. Studies: No results found.   Time spent: 35 minutes  Author: Berle Mull, MD [facility] 03/21/2019 5:07 PM  To reach On-call, see care teams to locate the attending and reach out to them via [email or web address]. If 7PM-7AM, please contact night-coverage If you still have  difficulty reaching the attending provider, please page the [facility] (Director on Call) for [facility] on amion for assistance.

## 2019-03-21 NOTE — Transfer of Care (Signed)
Immediate Anesthesia Transfer of Care Note  Patient: [name]  Procedure(s) Performed: ESOPHAGOGASTRODUODENOSCOPY (EGD) (N/A ) FLEXIBLE SIGMOIDOSCOPY (N/A ) BIOPSY  Patient Location: Endoscopy Unit  Anesthesia Type:MAC  Level of Consciousness: awake and patient cooperative  Airway & Oxygen Therapy: Patient Spontanous Breathing and Patient connected to face mask  Post-op Assessment: Report given to RN and Post -op Vital signs reviewed and stable  Post vital signs: Reviewed and stable  Last Vitals:  Vitals Value Taken Time  BP    Temp    Pulse    Resp    SpO2      Last Pain:  Vitals:   03/21/19 1111  TempSrc: Temporal  PainSc: 0-No pain      Patients Stated Pain Goal: 2 (34/03/70 9643)  Complications: No apparent anesthesia complications

## 2019-03-21 NOTE — Progress Notes (Signed)
Lab notified RN of critical lactic acid of 2.0. On call Bodenheimer paged and new bolus 500 ml ordered.

## 2019-03-21 NOTE — Progress Notes (Signed)
Sigmoidoscopy shows very soft liquid stool despite some prep.  Samples obtained and sent for lactoferrin, GI pathogen panel, C. difficile etc.  We will start patient on low residue diet and see how she does.

## 2019-03-21 NOTE — Progress Notes (Signed)
Dr Posey Pronto gave me orders via phone to hold 1000 meds that were due at bed time because of late procedure. The following med not given Atenolol, Buspar, VitD3, Cymbalta, Lyrica, Toviaz, and Klor-con.

## 2019-03-22 ENCOUNTER — Encounter (HOSPITAL_COMMUNITY): Payer: Self-pay | Admitting: Gastroenterology

## 2019-03-22 DIAGNOSIS — K529 Noninfective gastroenteritis and colitis, unspecified: Secondary | ICD-10-CM | POA: Diagnosis not present

## 2019-03-22 LAB — CBC WITH DIFFERENTIAL/PLATELET
Abs Immature Granulocytes: 0.04 10*3/uL (ref 0.00–0.07)
Basophils Absolute: 0 10*3/uL (ref 0.0–0.1)
Basophils Relative: 0 %
Eosinophils Absolute: 0.2 10*3/uL (ref 0.0–0.5)
Eosinophils Relative: 3 %
HCT: 30.5 % — ABNORMAL LOW (ref 36.0–46.0)
Hemoglobin: 9.7 g/dL — ABNORMAL LOW (ref 12.0–15.0)
Immature Granulocytes: 1 %
Lymphocytes Relative: 10 %
Lymphs Abs: 0.8 10*3/uL (ref 0.7–4.0)
MCH: 32 pg (ref 26.0–34.0)
MCHC: 31.8 g/dL (ref 30.0–36.0)
MCV: 100.7 fL — ABNORMAL HIGH (ref 80.0–100.0)
Monocytes Absolute: 0.5 10*3/uL (ref 0.1–1.0)
Monocytes Relative: 6 %
Neutro Abs: 6.1 10*3/uL (ref 1.7–7.7)
Neutrophils Relative %: 80 %
Platelets: 136 10*3/uL — ABNORMAL LOW (ref 150–400)
RBC: 3.03 MIL/uL — ABNORMAL LOW (ref 3.87–5.11)
RDW: 14.5 % (ref 11.5–15.5)
WBC: 7.5 10*3/uL (ref 4.0–10.5)
nRBC: 0 % (ref 0.0–0.2)

## 2019-03-22 LAB — MAGNESIUM: Magnesium: 1.7 mg/dL (ref 1.7–2.4)

## 2019-03-22 LAB — COMPREHENSIVE METABOLIC PANEL
ALT: 29 U/L (ref 0–44)
AST: 25 U/L (ref 15–41)
Albumin: 2.9 g/dL — ABNORMAL LOW (ref 3.5–5.0)
Alkaline Phosphatase: 120 U/L (ref 38–126)
Anion gap: 8 (ref 5–15)
BUN: 6 mg/dL — ABNORMAL LOW (ref 8–23)
CO2: 22 mmol/L (ref 22–32)
Calcium: 8.2 mg/dL — ABNORMAL LOW (ref 8.9–10.3)
Chloride: 112 mmol/L — ABNORMAL HIGH (ref 98–111)
Creatinine, Ser: 0.64 mg/dL (ref 0.44–1.00)
GFR calc Af Amer: >60 mL/min (ref >60)
GFR calc non Af Amer: >60 mL/min (ref >60)
Glucose, Bld: 100 mg/dL — ABNORMAL HIGH (ref 70–99)
Potassium: 3.8 mmol/L (ref 3.5–5.1)
Sodium: 142 mmol/L (ref 135–145)
Total Bilirubin: 0.3 mg/dL (ref 0.3–1.2)
Total Protein: 5.3 g/dL — ABNORMAL LOW (ref 6.5–8.1)

## 2019-03-22 LAB — LACTOFERRIN, FECAL, QUALITATIVE: Lactoferrin, Fecal, Qual: NEGATIVE

## 2019-03-22 LAB — SURGICAL PATHOLOGY

## 2019-03-22 MED ORDER — GLYCOPYRROLATE 1 MG PO TABS
2.0000 mg | ORAL_TABLET | Freq: Two times a day (BID) | ORAL | Status: DC
  Administered 2019-03-22 – 2019-03-24 (×5): 2 mg via ORAL
  Filled 2019-03-22 (×6): qty 2

## 2019-03-22 MED ORDER — CEPHALEXIN 500 MG PO CAPS
500.0000 mg | ORAL_CAPSULE | Freq: Two times a day (BID) | ORAL | Status: DC
  Administered 2019-03-22 – 2019-03-24 (×5): 500 mg via ORAL
  Filled 2019-03-22 (×5): qty 1

## 2019-03-22 MED ORDER — DIPHENOXYLATE-ATROPINE 2.5-0.025 MG PO TABS
1.0000 | ORAL_TABLET | Freq: Four times a day (QID) | ORAL | Status: DC
  Administered 2019-03-22 – 2019-03-24 (×9): 1 via ORAL
  Filled 2019-03-22 (×9): qty 1

## 2019-03-22 NOTE — Progress Notes (Signed)
[facility] PROGRESS NOTE Subjective Patient is still complaining of diarrhea had 3 loose stools this morning.  EGD showed normal postoperative stomach with normal efferent and afferent limbs.  The stool was soft but not liquidy.  Samples were sent for multiple studies.  These are still pending except C. difficile which is negative.  Biopsies were taken.  Objective: Vital signs in last 24 hours: Temp:  [96.8 F (36 C)-99.3 F (37.4 C)] 98 F (36.7 C) (10/23 0522) Pulse Rate:  [66-96] 77 (10/23 0522) Resp:  [13-26] 16 (10/23 0522) BP: (106-160)/(48-132) 139/69 (10/23 0522) SpO2:  [92 %-100 %] 95 % (10/23 0522) Last BM Date: 03/19/19  Intake/Output from previous day: 10/22 0701 - 10/23 0700 In: 3080.5 [P.O.:640; I.V.:1904.7; IV Piggyback:535.8] Out: 650 [Urine:650] Intake/Output this shift: No intake/output data recorded.  PE: General--frail appearing white female Abdomen--nondistended nontender  Lab Results: Recent Labs    03/19/19 1616 03/20/19 0402 03/21/19 0323 03/22/19 0311  WBC 7.9 7.2 5.2 7.5  HGB 12.1 12.3 10.4* 9.7*  HCT 37.6 38.8 33.0* 30.5*  PLT 217 186 137* 136*   BMET Recent Labs    03/19/19 1616 03/20/19 0402 03/21/19 0323 03/22/19 0311  NA 139 141 140 142  K 2.9* 3.3* 3.8 3.8  CL 104 105 111 112*  CO2 22 20* 20* 22  CREATININE 1.54* 1.13* 0.72 0.64   LFT Recent Labs    03/20/19 0402 03/21/19 0323 03/22/19 0311  PROT 6.5 5.6* 5.3*  AST 33 35 25  ALT 36 31 29  ALKPHOS 151* 126 120  BILITOT 0.9 0.5 0.3   PT/INR No results for input(s): LABPROT, INR in the last 72 hours. PANCREAS No results for input(s): LIPASE in the last 72 hours.       Studies/Results: No results found.  Medications: I have reviewed the patient's current medications.  Assessment:   1.  Diarrhea.  The etiology of this is unclear.  She is currently on broad-spectrum antibiotics for unclear reasons.  There was no sign of active colitis on  sigmoidoscopy yesterday.  All the studies are pending.  It appears that she has been taking different things to try to control her diarrhea and have explained to her that she may need to get on some medications on a regular basis.  Suspicion is that this is somehow related to her recurrence of pancreatic cancer.   Plan: 1.  I will stop the antibiotics and the MiraLAX. 2.  We will place her on glycopyrrolate on a regular basis as well as Lomotil on a regular basis and see if this will control her stools while all of the other studies are pending.   [name] 03/22/2019, 9:50 AM  This note was created using voice recognition software. Minor errors may Have occurred unintentionally.  Pager: [phone] If no answer or after hours call [phone]

## 2019-03-22 NOTE — Progress Notes (Signed)
[facility] Progress Note  Patient: [name] [id number]   PCP: Briscoe Deutscher, DO DOB: 1940/08/21   DOA: 03/19/2019   DOS: 03/22/2019   Date of Service: the patient was seen and examined on 03/22/2019  Chief Complaint  Patient presents with  . Dizziness   Brief hospital course: [name] is a 78 y.o. 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  Patient also has worsening diarrhea with abdominal cramping.  She has had diarrhea over the last 3 months.  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  Patient has recently been treated with Cipro and Flagyl.  She was previously tested for C. difficile and was negative.  She has been dehydrated.  Still taking Lasix and hydrochlorothiazide.  She is also noted confusion difficulty talking occasionally.  Patient's dizziness is both at rest and when moving.  In the last 48 hours she has fallen.  Primarily son is with the patient giving more history.  She appears very dehydrated and chronically ill looking.  She has not been eating well apparently.  Patient is being admitted with severe dehydration secondary to poor oral intake.  Also colitis as seen on her CT.  Currently further plan is follow GI recommendation.  Subjective: Still has multiple BM with loose watery stool.  No blood in the stool.  No abdominal pain at the time of my evaluation but reports that she had some pain earlier.  She also reports chronic neuropathy pain which is controlled with pain medication.  Assessment and Plan: #1 acute colitis:  Etiology not clear.  Extremities with gastroenterology consulted. Patient underwent endoscopy and sigmoidoscopy. Some stool sample was sent for  further work-up.  C. difficile is negative. Patient will be advanced to low residue diet and will monitor. No antibiotics needed per GI. Current plan is to use Lomotil and other medication for control of diarrhea.  #2 recurrent falls with dizziness:  Patient has gait abnormalities but this most recent falls could be related to dehydration and orthostasis.  Aggressively hydrate the patient and monitor PT OT recommends home with home health.  #3 hypokalemia:  Hypomagnesemia Most likely from persistent diarrhea. Continue to replace as as and when needed.  #4 acute kidney injury: Prerenal most likely from dehydration.  Aggressively hydrate and monitor renal function  #5 hypertension:  We will hold some blood pressure medicines especially diuretics.  Continue to monitor  #6 history of pancreatic cancer: Status post Whipple's procedure.  Continue follow-up with oncology  Neuropathy. Continue Lyrica.  Klebsiella UTI. We will treat with oral Keflex to complete total 5-day treatment course.  Diet: Clear liquid diet, advance as tolerated  DVT Prophylaxis: Subcutaneous Lovenox  Advance goals of care discussion: Full code  Family Communication: no family was present at bedside, at the time of interview.   Disposition:  Discharge to be determined.  Consultants: [facility]   Procedures: EGD and colonoscopy  Scheduled Meds: . amLODipine  5 mg Oral Daily  . aspirin EC  81 mg Oral Daily  . atenolol  50 mg Oral BID  . busPIRone  5 mg Oral BID  . calcium-vitamin D  1 tablet Oral Daily  . cephALEXin  500 mg Oral Q12H  . cholecalciferol  1,000 Units Oral BID  . diphenoxylate-atropine  1 tablet Oral QID  . DULoxetine  60 mg Oral BID  . fesoterodine  4 mg Oral Daily  . glycopyrrolate  2 mg Oral BID  . heparin  5,000 Units Subcutaneous Q8H  . influenza vaccine adjuvanted  0.5 mL Intramuscular Tomorrow-1000  . latanoprost  1 drop Both Eyes QHS  . levothyroxine  175 mcg  Oral Q0600  . lipase/protease/amylase  24,000 Units Oral TID WC  . polyvinyl alcohol  1 drop Both Eyes BID  . potassium chloride  10 mEq Oral BID  . pravastatin  40 mg Oral q1800  . pregabalin  300 mg Oral BID  . rOPINIRole  0.5 mg Oral QHS  . sodium chloride flush  3 mL Intravenous Once   Continuous Infusions: . dextrose 5 % and 0.9 % NaCl with KCl 20 mEq/L 100 mL/hr at 03/22/19 1800   PRN Meds: betamethasone dipropionate, HYDROcodone-acetaminophen, loperamide, ondansetron **OR** ondansetron (ZOFRAN) IV, simethicone, triamcinolone ointment Antibiotics: Anti-infectives (From admission, onward)   Start     Dose/Rate Route Frequency Ordered Stop   03/22/19 1300  cephALEXin (KEFLEX) capsule 500 mg     500 mg Oral Every 12 hours 03/22/19 1242 03/25/19 0959   03/21/19 2045  metroNIDAZOLE (FLAGYL) IVPB 500 mg  Status:  Discontinued     500 mg 100 mL/hr over 60 Minutes Intravenous Every 8 hours 03/21/19 2022 03/22/19 0957   03/20/19 0800  metroNIDAZOLE (FLAGYL) IVPB 500 mg  Status:  Discontinued     500 mg 100 mL/hr over 60 Minutes Intravenous Every 8 hours 03/20/19 0311 03/21/19 2022   03/20/19 0330  cefTRIAXone (ROCEPHIN) 2 g in sodium chloride 0.9 % 100 mL IVPB  Status:  Discontinued     2 g 200 mL/hr over 30 Minutes Intravenous Daily at bedtime 03/20/19 0311 03/22/19 0957   03/20/19 0000  ciprofloxacin (CIPRO) IVPB 400 mg     400 mg 200 mL/hr over 60 Minutes Intravenous  Once 03/19/19 2355 03/20/19 0512   03/20/19 0000  metroNIDAZOLE (FLAGYL) IVPB 500 mg     500 mg 100 mL/hr over 60 Minutes Intravenous  Once 03/19/19 2355 03/20/19 0245       Objective: Physical Exam: Vitals:   03/21/19 2243 03/22/19 0522 03/22/19 1411 03/22/19 2025  BP: (!) 160/88 139/69 133/62 139/73  Pulse: 96 77 68 79  Resp: 18 16 17 20   Temp:  98 F (36.7 C) 98.4 F (36.9 C) 98.7 F (37.1 C)  TempSrc:  Oral Oral Oral  SpO2: 98% 95% 97% 97%  Weight:      Height:        Intake/Output Summary (Last  24 hours) at 03/22/2019 2120 Last data filed at 03/22/2019 2012 Gross per 24 hour  Intake 1577.85 ml  Output 250 ml  Net 1327.85 ml   Filed Weights   03/19/19 1522 03/20/19 0302  Weight: 65.8 kg 63.8 kg   General: alert and oriented to time, place, and person. Appear in mild distress, affect appropriate Eyes: PERRL, Conjunctiva normal ENT: Oral Mucosa Clear, moist  Neck: no JVD, no Abnormal Mass Or lumps Cardiovascular: S1 and S2 Present, no Murmur, peripheral pulses symmetrical Respiratory: good respiratory effort, Bilateral Air entry equal and Decreased, no signs of accessory muscle use, Clear to Auscultation, no Crackles, no wheezes Abdomen: Bowel Sound present, Soft and mild tenderness, no hernia Skin: no rashes  Extremities: no Pedal edema, no calf tenderness Neurologic: without any new focal findings Gait not checked due to patient safety concerns  Data Reviewed:  I have personally reviewed and interpreted daily labs, tele strips, imagings as discussed above. I reviewed all nursing notes, pharmacy notes, vitals, pertinent old records I have discussed plan of care as described above with RN and patient/family.  CBC: Recent Labs  Lab 03/19/19 1616 03/20/19 0402 03/21/19 0323 03/22/19 0311  WBC 7.9 7.2 5.2 7.5  NEUTROABS  --   --  3.4 6.1  HGB 12.1 12.3 10.4* 9.7*  HCT 37.6 38.8 33.0* 30.5*  MCV 99.5 100.3* 102.2* 100.7*  PLT 217 186 137* [id number]*   Basic Metabolic Panel: Recent Labs  Lab 03/19/19 1616 03/20/19 0402 03/21/19 0323 03/22/19 0311  NA 139 141 140 142  K 2.9* 3.3* 3.8 3.8  CL 104 105 111 112*  CO2 22 20* 20* 22  GLUCOSE 118* 91 113* 100*  BUN 34* 30* 11 6*  CREATININE 1.54* 1.13* 0.72 0.64  CALCIUM 8.2* 8.0* 7.8* 8.2*  MG  --   --  1.5* 1.7    Liver Function Tests: Recent Labs  Lab 03/20/19 0402 03/21/19 0323 03/22/19 0311  AST 33 35 25  ALT 36 31 29  ALKPHOS 151* 126 120  BILITOT 0.9 0.5 0.3  PROT 6.5 5.6* 5.3*  ALBUMIN 3.9 3.0* 2.9*    No results for input(s): LIPASE, AMYLASE in the last 168 hours. No results for input(s): AMMONIA in the last 168 hours. Coagulation Profile: No results for input(s): INR, PROTIME in the last 168 hours. Cardiac Enzymes: No results for input(s): CKTOTAL, CKMB, CKMBINDEX, TROPONINI in the last 168 hours. BNP (last 3 results) No results for input(s): PROBNP in the last 8760 hours. CBG: No results for input(s): GLUCAP in the last 168 hours. Studies: No results found.   Time spent: 35 minutes  Author: Berle Mull, MD [facility] 03/22/2019 9:20 PM  To reach On-call, see care teams to locate the attending and reach out to them via [email or web address]. If 7PM-7AM, please contact night-coverage If you still have difficulty reaching the attending provider, please page the [facility] (Director on Call) for [facility] on amion for assistance.

## 2019-03-22 NOTE — Progress Notes (Signed)
Notified NP on call for previous orders for C-Diff.  Orders received and stool sent and result of test was negative.

## 2019-03-22 NOTE — Evaluation (Signed)
Physical Therapy Evaluation Patient Details Name: LUWAM CURRIE MRN: [id number] DOB: 1940-10-23 Today's Date: 03/22/2019   History of Present Illness  Pt is 78 yo female admitted on 03/19/19 with colitis and falls.  She is s/p EGD and upper GI endoscopy.  Clinical Impression  Pt presents with decreased balance, safety, mobility, and endurance.  Evaluation was somewhat limited due to pt with frequent diarrhea and fatigued easily.  She has history of dementia but was A and O x 3 with PT.  Reports she has intermittent support from family.  Will benefit from acute PT services to address impairments.     Follow Up Recommendations Home health PT    Equipment Recommendations  Rolling walker with 5" wheels(may need new RW - reports hers is "old")    Recommendations for Other Services       Precautions / Restrictions Precautions Precautions: Fall Restrictions Weight Bearing Restrictions: No      Mobility  Bed Mobility Overal bed mobility: Needs Assistance Bed Mobility: Supine to Sit     Supine to sit: Min guard     General bed mobility comments: increase time  Transfers Overall transfer level: Needs assistance Equipment used: Rolling walker (2 wheeled) Transfers: Sit to/from [facility] Sit to Stand: Min guard Stand pivot transfers: Min guard       General transfer comment: cues for safety; performed sit to stand x 4; stand pivot x 2; required assist with toielting ADLS  Ambulation/Gait Ambulation/Gait assistance: Min assist Gait Distance (Feet): 50 Feet Assistive device: Rolling walker (2 wheeled)   Gait velocity: decreased   General Gait Details: cues for RW ; assist to turn RW; only ambulated in room due to frequent diarrhea  Stairs            Wheelchair Mobility    Modified Rankin (Stroke Patients Only)       Balance Overall balance assessment: Mild deficits observed, not formally tested                                           Pertinent Vitals/Pain Pain Assessment: 0-10 Pain Score: 6  Pain Location: upper abdomen Pain Descriptors / Indicators: Sore Pain Intervention(s): Repositioned;Limited activity within patient's tolerance    Home Living Family/patient expects to be discharged to:: Private residence Living Arrangements: Alone Available Help at Discharge: Family Type of Home: House Home Access: Ramped entrance     Home Layout: One level Home Equipment: Environmental consultant - 2 wheels;Bedside commode;Shower seat;Cane - single point Additional Comments: Pt reports her 2 sons can assist at d/c    Prior Function Level of Independence: Needs assistance   Gait / Transfers Assistance Needed: Reports she could ambulate in the house with RW and occasionally used a cane.  ADL's / Homemaking Assistance Needed: Reports she could perform ADLs but had assist with IADLs  Comments: Pt questionable historian due to hx of dementia.     Hand Dominance        Extremity/Trunk Assessment   Upper Extremity Assessment Upper Extremity Assessment: RUE deficits/detail;LUE deficits/detail RUE Deficits / Details: 4/5 grossly throughout LUE Deficits / Details: 4/5 grossly throughout    Lower Extremity Assessment Lower Extremity Assessment: RLE deficits/detail;LLE deficits/detail RLE Deficits / Details: 4/5 grossly throughout LLE Deficits / Details: 4/5 grossly throughout       Communication   Communication: No difficulties  Cognition Arousal/Alertness: Awake/alert Behavior During  Therapy: WFL for tasks assessed/performed Overall Cognitive Status: History of cognitive impairments - at baseline                                        General Comments      Exercises     Assessment/Plan    PT Assessment Patient needs continued PT services  PT Problem List Decreased strength;Decreased mobility;Decreased safety awareness;Decreased range of motion;Decreased activity tolerance;Decreased  cognition;Decreased balance       PT Treatment Interventions DME instruction;Therapeutic exercise;Gait training;Balance training;Neuromuscular re-education;Functional mobility training;Therapeutic activities;Patient/family education    PT Goals (Current goals can be found in the Care Plan section)  Acute Rehab PT Goals Patient Stated Goal: return home with HHPT PT Goal Formulation: With patient Time For Goal Achievement: 04/05/19 Potential to Achieve Goals: Good    Frequency Min 2X/week   Barriers to discharge        Co-evaluation               AM-PAC PT "6 Clicks" Mobility  Outcome Measure Help needed turning from your back to your side while in a flat bed without using bedrails?: A Little Help needed moving from lying on your back to sitting on the side of a flat bed without using bedrails?: A Little Help needed moving to and from a bed to a chair (including a wheelchair)?: A Little Help needed standing up from a chair using your arms (e.g., wheelchair or bedside chair)?: A Little Help needed to walk in hospital room?: A Little Help needed climbing 3-5 steps with a railing? : Total 6 Click Score: 16    End of Session Equipment Utilized During Treatment: Gait belt Activity Tolerance: Patient tolerated treatment well Patient left: in chair;with chair alarm set;with call bell/phone within reach   PT Visit Diagnosis: Other abnormalities of gait and mobility (R26.89);History of falling (Z91.81);Muscle weakness (generalized) (M62.81)    Time: [id number] PT Time Calculation (min) (ACUTE ONLY): 32 min   Charges:   PT Evaluation $PT Eval Low Complexity: 1 Low PT Treatments $Therapeutic Activity: 8-22 mins        Maggie Font, PT Acute Rehab Services [phone]   Karlton Lemon 03/22/2019, 11:03 AM

## 2019-03-22 NOTE — Care Management Important Message (Signed)
Important Message  Patient Details IM Letter given to [facility] SW to present to  the Patient Name: [name] MRN: [id number] Date of Birth: 29-Jan-1941   Medicare Important Message Given:  Yes     Kerin Salen 03/22/2019, 10:05 AM

## 2019-03-22 NOTE — Anesthesia Postprocedure Evaluation (Signed)
Anesthesia Post Note  Patient: [name]  Procedure(s) Performed: ESOPHAGOGASTRODUODENOSCOPY (EGD) (N/A ) FLEXIBLE SIGMOIDOSCOPY (N/A ) BIOPSY     Patient location during evaluation: Endoscopy Anesthesia Type: MAC Level of consciousness: awake Pain management: pain level controlled Vital Signs Assessment: post-procedure vital signs reviewed and stable Respiratory status: spontaneous breathing Cardiovascular status: stable Postop Assessment: no apparent nausea or vomiting Anesthetic complications: no    Last Vitals:  Vitals:   03/21/19 2243 03/22/19 0522  BP: (!) 160/88 139/69  Pulse: 96 77  Resp: 18 16  Temp:  36.7 C  SpO2: 98% 95%    Last Pain:  Vitals:   03/22/19 0522  TempSrc: Oral  PainSc:    Pain Goal: Patients Stated Pain Goal: 2 (03/22/19 0035)                 Huston Foley

## 2019-03-22 NOTE — TOC Initial Note (Signed)
Transition of Care [facility]) - Initial/Assessment Note    Patient Details  Name: [name] MRN: [id number] Date of Birth: 1941/04/01  [facility]) CM/SW Contact:    Nila Nephew, LCSW Phone Number: [phone] 03/22/2019, 3:20 PM  Clinical Narrative:     Pt admitted with colitis, from home where she resides alone, sons nearby supportive. Discussed home health and DME recommendations with pt. Discussed agency options and made referrals to [facility] and [facility], both accepted.    Expected Discharge Plan: [place] Barriers to Discharge: Continued Medical Work up   Patient Goals and CMS Choice Patient states their goals for this hospitalization and ongoing recovery are:: "Have someone to help me with how to take my medications" CMS Medicare.gov Compare Post Acute Care list provided to:: Patient Choice offered to / list presented to : Patient  Expected Discharge Plan and Services Expected Discharge Plan: [place]   Discharge Planning Services: CM Consult Post Acute Care Choice: Durable Medical Equipment, Home Health Living arrangements for the past 2 months: Single Family Home                 DME Arranged: Walker rolling DME Agency: [facility] Date DME Agency Contacted: 03/22/19 Time DME Agency Contacted: [id number] Representative spoke with at DME Agency: [place]: RN, PT [place] Agency: [facility] ([place]) Date [place]: 03/22/19 Time [place]: 1446 Representative spoke with at [place]: Santiago Glad  Prior Living Arrangements/Services Living arrangements for the past 2 months: [place]   Patient language and need for interpreter reviewed:: Yes Do you feel safe going back to the place where you live?: Yes      Need for Family Participation in Patient Care: No (Comment) Care giver support system in place?: No (comment)(reports sons available)   Criminal Activity/Legal  Involvement Pertinent to Current Situation/Hospitalization: No - Comment as needed  Activities of Daily Living Home Assistive Devices/Equipment: Environmental consultant (specify type), Wheelchair ADL Screening (condition at time of admission) Patient's cognitive ability adequate to safely complete daily activities?: Yes Is the patient deaf or have difficulty hearing?: Yes Does the patient have difficulty seeing, even when wearing glasses/contacts?: No Does the patient have difficulty concentrating, remembering, or making decisions?: No Patient able to express need for assistance with ADLs?: Yes Does the patient have difficulty dressing or bathing?: No Independently performs ADLs?: Yes (appropriate for developmental age) Does the patient have difficulty walking or climbing stairs?: Yes Weakness of Legs: Both Weakness of Arms/Hands: None   Emotional Assessment   Attitude/Demeanor/Rapport: Gracious, Engaged Affect (typically observed): Pleasant Orientation: : Oriented to Situation, Oriented to  Time, Oriented to Place, Oriented to Self Alcohol / Substance Use: Not Applicable Psych Involvement: No (comment)  Admission diagnosis:  Hypokalemia [E87.6] Colitis [K52.9] Patient Active Problem List   Diagnosis Date Noted  . Diarrhea 03/20/2019  . Fall at home, initial encounter 03/20/2019  . Acute colitis 03/20/2019  . Hypokalemia 03/20/2019  . Acute renal failure (ARF) ([place]) 03/20/2019  . Aortic atherosclerosis ([place]) 01/05/2019  . 3-vessel CAD, on CT chest 01/05/2019  . Primary open angle glaucoma (POAG) of both eyes, moderate stage 04/18/2018  . Pseudophakia, right eye 04/18/2018  . History of lumbar fusion 11/01/2017  . Tardive dyskinesia, vitamin E   . History of stroke   . Mitral valve prolapse   . Chronic maxillary sinusitis, using NetiPot, flonase   . 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 Chronic  left sacral fracture with mild edema, MRI 07/2017 01/07/2017  .  Thrombocytopenia ([employer]), mild, chronic 01/07/2017  . HLD (hyperlipidemia), on Lovastatin 07/28/2015  . Fatty liver, on CT 09/09/14 09/09/2014  . Exocrine pancreatic insufficiency, Rx Creon 12/30/2013  . Carcinoma of head of pancreas [facility]), followed by Dr [name], Oncology 07/01/2013  . Peripheral neuropathy, Lyrica 400 mg BID 08/30/2012  . Nuclear sclerosis, left 04/13/2011  . 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 Colon polyps 06/05/2008  . Osteoarthritis, mulitple sites 06/04/2008  . Chronic diarrhea, intermittent 04/22/2008  . Hypothyroidism, on Levothryoxine 02/26/2007  . Adjustment disorder with mixed anxiety and depressed mood, on Cymbalta and Buspar 02/26/2007  . Essential hypertension 02/26/2007  . GERD, on Prilosec 02/26/2007  . Osteoporosis, on Calcium and Vitamin D 02/26/2007   PCP:  Briscoe Deutscher, DO Pharmacy:   [place], [place] [address] [place] [address] Phone: [phone] Fax: [phone]  [id number] - [place], [place] [id number] [place] [address] Phone: [phone] Fax: [phone]  [facility] [address], [place] - [id number] [place] [address] Phone: [phone] Fax: [place] [address], [place] - [place] [address] [place] [address] Phone: [phone] Fax: [phone]

## 2019-03-23 ENCOUNTER — Inpatient Hospital Stay (HOSPITAL_COMMUNITY): Payer: Medicare Other

## 2019-03-23 DIAGNOSIS — R296 Repeated falls: Secondary | ICD-10-CM | POA: Diagnosis present

## 2019-03-23 DIAGNOSIS — N390 Urinary tract infection, site not specified: Secondary | ICD-10-CM | POA: Diagnosis present

## 2019-03-23 DIAGNOSIS — H409 Unspecified glaucoma: Secondary | ICD-10-CM | POA: Diagnosis present

## 2019-03-23 DIAGNOSIS — K219 Gastro-esophageal reflux disease without esophagitis: Secondary | ICD-10-CM | POA: Diagnosis present

## 2019-03-23 DIAGNOSIS — W19XXXA Unspecified fall, initial encounter: Secondary | ICD-10-CM | POA: Diagnosis present

## 2019-03-23 DIAGNOSIS — A071 Giardiasis [lambliasis]: Secondary | ICD-10-CM | POA: Diagnosis present

## 2019-03-23 DIAGNOSIS — F0390 Unspecified dementia without behavioral disturbance: Secondary | ICD-10-CM | POA: Diagnosis present

## 2019-03-23 DIAGNOSIS — K228 Other specified diseases of esophagus: Secondary | ICD-10-CM | POA: Diagnosis present

## 2019-03-23 DIAGNOSIS — K529 Noninfective gastroenteritis and colitis, unspecified: Secondary | ICD-10-CM | POA: Diagnosis not present

## 2019-03-23 DIAGNOSIS — K5730 Diverticulosis of large intestine without perforation or abscess without bleeding: Secondary | ICD-10-CM | POA: Diagnosis present

## 2019-03-23 DIAGNOSIS — C250 Malignant neoplasm of head of pancreas: Secondary | ICD-10-CM | POA: Diagnosis present

## 2019-03-23 DIAGNOSIS — E8770 Fluid overload, unspecified: Secondary | ICD-10-CM | POA: Diagnosis not present

## 2019-03-23 DIAGNOSIS — J320 Chronic maxillary sinusitis: Secondary | ICD-10-CM | POA: Diagnosis present

## 2019-03-23 DIAGNOSIS — E890 Postprocedural hypothyroidism: Secondary | ICD-10-CM | POA: Diagnosis present

## 2019-03-23 DIAGNOSIS — E876 Hypokalemia: Secondary | ICD-10-CM | POA: Diagnosis present

## 2019-03-23 DIAGNOSIS — K760 Fatty (change of) liver, not elsewhere classified: Secondary | ICD-10-CM | POA: Diagnosis present

## 2019-03-23 DIAGNOSIS — I341 Nonrheumatic mitral (valve) prolapse: Secondary | ICD-10-CM | POA: Diagnosis present

## 2019-03-23 DIAGNOSIS — E860 Dehydration: Secondary | ICD-10-CM | POA: Diagnosis present

## 2019-03-23 DIAGNOSIS — Y92009 Unspecified place in unspecified non-institutional (private) residence as the place of occurrence of the external cause: Secondary | ICD-10-CM | POA: Diagnosis not present

## 2019-03-23 DIAGNOSIS — N179 Acute kidney failure, unspecified: Secondary | ICD-10-CM | POA: Diagnosis present

## 2019-03-23 DIAGNOSIS — R42 Dizziness and giddiness: Secondary | ICD-10-CM | POA: Diagnosis present

## 2019-03-23 DIAGNOSIS — G629 Polyneuropathy, unspecified: Secondary | ICD-10-CM | POA: Diagnosis present

## 2019-03-23 DIAGNOSIS — M1990 Unspecified osteoarthritis, unspecified site: Secondary | ICD-10-CM | POA: Diagnosis present

## 2019-03-23 DIAGNOSIS — G8929 Other chronic pain: Secondary | ICD-10-CM | POA: Diagnosis present

## 2019-03-23 DIAGNOSIS — Z23 Encounter for immunization: Secondary | ICD-10-CM | POA: Diagnosis present

## 2019-03-23 DIAGNOSIS — I10 Essential (primary) hypertension: Secondary | ICD-10-CM | POA: Diagnosis present

## 2019-03-23 DIAGNOSIS — Z20828 Contact with and (suspected) exposure to other viral communicable diseases: Secondary | ICD-10-CM | POA: Diagnosis present

## 2019-03-23 DIAGNOSIS — F329 Major depressive disorder, single episode, unspecified: Secondary | ICD-10-CM | POA: Diagnosis present

## 2019-03-23 LAB — CBC
HCT: 30.2 % — ABNORMAL LOW (ref 36.0–46.0)
Hemoglobin: 9.5 g/dL — ABNORMAL LOW (ref 12.0–15.0)
MCH: 32.1 pg (ref 26.0–34.0)
MCHC: 31.5 g/dL (ref 30.0–36.0)
MCV: 102 fL — ABNORMAL HIGH (ref 80.0–100.0)
Platelets: 101 10*3/uL — ABNORMAL LOW (ref 150–400)
RBC: 2.96 MIL/uL — ABNORMAL LOW (ref 3.87–5.11)
RDW: 14.8 % (ref 11.5–15.5)
WBC: 4 10*3/uL (ref 4.0–10.5)
nRBC: 0 % (ref 0.0–0.2)

## 2019-03-23 LAB — BASIC METABOLIC PANEL
Anion gap: 7 (ref 5–15)
BUN: <5 mg/dL — ABNORMAL LOW (ref 8–23)
CO2: 22 mmol/L (ref 22–32)
Calcium: 8 mg/dL — ABNORMAL LOW (ref 8.9–10.3)
Chloride: 112 mmol/L — ABNORMAL HIGH (ref 98–111)
Creatinine, Ser: 0.72 mg/dL (ref 0.44–1.00)
GFR calc Af Amer: >60 mL/min (ref >60)
GFR calc non Af Amer: >60 mL/min (ref >60)
Glucose, Bld: 115 mg/dL — ABNORMAL HIGH (ref 70–99)
Potassium: 3.9 mmol/L (ref 3.5–5.1)
Sodium: 141 mmol/L (ref 135–145)

## 2019-03-23 LAB — MAGNESIUM: Magnesium: 1.7 mg/dL (ref 1.7–2.4)

## 2019-03-23 MED ORDER — TINIDAZOLE 500 MG PO TABS
2.0000 g | ORAL_TABLET | Freq: Once | ORAL | Status: AC
  Administered 2019-03-23: 2000 mg via ORAL
  Filled 2019-03-23: qty 4

## 2019-03-23 MED ORDER — ALUM & MAG HYDROXIDE-SIMETH 200-200-20 MG/5ML PO SUSP
30.0000 mL | ORAL | Status: DC | PRN
  Administered 2019-03-23 – 2019-03-25 (×4): 30 mL via ORAL
  Filled 2019-03-23 (×4): qty 30

## 2019-03-23 MED ORDER — COLESTIPOL HCL 1 G PO TABS
2.0000 g | ORAL_TABLET | Freq: Every day | ORAL | Status: DC
  Administered 2019-03-24 – 2019-03-26 (×3): 2 g via ORAL
  Filled 2019-03-23 (×3): qty 2

## 2019-03-23 MED ORDER — ALBUTEROL SULFATE (2.5 MG/3ML) 0.083% IN NEBU
2.5000 mg | INHALATION_SOLUTION | Freq: Four times a day (QID) | RESPIRATORY_TRACT | Status: DC | PRN
  Administered 2019-03-23 – 2019-03-26 (×4): 2.5 mg via RESPIRATORY_TRACT
  Filled 2019-03-23 (×4): qty 3

## 2019-03-23 MED ORDER — FAMOTIDINE 20 MG PO TABS
10.0000 mg | ORAL_TABLET | Freq: Two times a day (BID) | ORAL | Status: DC | PRN
  Administered 2019-03-23 – 2019-03-25 (×2): 10 mg via ORAL
  Filled 2019-03-23 (×2): qty 1

## 2019-03-23 NOTE — Progress Notes (Signed)
[facility] Gastroenterology Progress Note  TIRZAH LEBLEU 78 y.o. 12-09-1940  CC: Diarrhea  Subjective: She continues to have diarrhea.  Had 3 soft bowel movement this morning.  Denies any blood in the stool.  Complaining of nausea.  Denied any vomiting.  ROS : Afebrile.  Complaining of fatigue.  Denies any acute chest pain.  Objective: Vital signs in last 24 hours: Vitals:   03/23/19 0443 03/23/19 0847  BP: 138/87   Pulse: 72   Resp: 18   Temp: 97.8 F (36.6 C)   SpO2: 96% 96%    Physical Exam:  General.  Elderly appearing patient.  Not in acute distress Abdomen.  Soft, nontender, nondistended, bowel sounds present.  No peritoneal signs Mood and affect.  Somewhat anxious. Neuro.  Alert/oriented x3  Lab Results: Recent Labs    03/22/19 0311 03/23/19 0358  NA 142 141  K 3.8 3.9  CL 112* 112*  CO2 22 22  GLUCOSE 100* 115*  BUN 6* <5*  CREATININE 0.64 0.72  CALCIUM 8.2* 8.0*  MG 1.7 1.7   Recent Labs    03/21/19 0323 03/22/19 0311  AST 35 25  ALT 31 29  ALKPHOS 126 120  BILITOT 0.5 0.3  PROT 5.6* 5.3*  ALBUMIN 3.0* 2.9*   Recent Labs    03/21/19 0323 03/22/19 0311 03/23/19 0358  WBC 5.2 7.5 4.0  NEUTROABS 3.4 6.1  --   HGB 10.4* 9.7* 9.5*  HCT 33.0* 30.5* 30.2*  MCV 102.2* 100.7* 102.0*  PLT 137* 136* 101*   No results for input(s): LABPROT, INR in the last 72 hours.    Assessment/Plan: -Diarrhea.  Etiology unclear. ??  Bile salt diarrhea.  Stool studies negative including C. difficile.  Flexible sigmoidoscopy on March 21, 2019 showed mild inflammation in the transverse colon otherwise no evidence of active colitis.  Biopsies were negative for any evidence of colitis.  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 -History of pancreatic cancer s/p Whipple surgery and radiation .  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  Recommendations -------------------------- -Trial of Colestid. -Follow pancreatic elastase -Okay to DC  antibiotics from GI standpoint -GI will follow  [name] MD, FACP 03/23/2019, 10:57 AM  Contact #  [phone]

## 2019-03-23 NOTE — Progress Notes (Signed)
[facility] Progress Note  Patient: [name] [id number]   PCP: Briscoe Deutscher, DO DOB: 1940-09-24   DOA: 03/19/2019   DOS: 03/23/2019   Date of Service: the patient was seen and examined on 03/23/2019  Chief Complaint  Patient presents with  . Dizziness   Brief hospital course: [name] is a 78 y.o. 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  Patient also has worsening diarrhea with abdominal cramping.  She has had diarrhea over the last 3 months.  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  Patient has recently been treated with Cipro and Flagyl.  She was previously tested for C. difficile and was negative.  She has been dehydrated.  Still taking Lasix and hydrochlorothiazide.  She is also noted confusion difficulty talking occasionally.  Patient's dizziness is both at rest and when moving.  In the last 48 hours she has fallen.  Primarily son is with the patient giving more history.  She appears very dehydrated and chronically ill looking.  She has not been eating well apparently.  Patient is being admitted with severe dehydration secondary to poor oral intake.  Also colitis as seen on her CT.  Currently further plan is follow GI recommendation.  Subjective: Patient reports multiple diarrhea this morning also reports nausea and also reports abdominal pain which is uncontrolled.  Also reports pain in the leg.  No fever no chills.  Assessment and Plan: #1 acute colitis:  Giardia antigen positive. Etiology not clear.  Extremities with gastroenterology consulted. Patient underwent endoscopy and sigmoidoscopy. Some stool sample was sent for further work-up.  C. difficile is negative.  Patient will be advanced to low residue diet and will monitor. No antibiotics needed per GI. Current plan is to use Lomotil and other medication for control of diarrhea. We will give 1 dose of 2 g. Tnidazole.  Pharmacy consulted.  #2 recurrent falls with dizziness:  Patient has gait abnormalities but this most recent falls could be related to dehydration and orthostasis.  Aggressively hydrate the patient and monitor PT OT recommends home with home health.  #3 hypokalemia:  Hypomagnesemia Most likely from persistent diarrhea. Continue to replace as as and when needed.  #4 acute kidney injury: Prerenal most likely from dehydration.  Aggressively hydrate and monitor renal function  #5 hypertension:  We will hold some blood pressure medicines especially diuretics.  Continue to monitor  #6 history of pancreatic cancer: Status post Whipple's procedure.  Continue follow-up with oncology  Neuropathy. Continue Lyrica.  Klebsiella UTI. We will treat with oral Keflex to complete total 5-day treatment course.  Diet: Regular diet DVT Prophylaxis: Subcutaneous Lovenox  Advance goals of care discussion: Full code  Family Communication: no family was present at bedside, at the time of interview.  Discussed with family at bedside on 03/22/2019  Disposition:  Discharge to be determined.  Consultants: [facility]   Procedures: EGD and colonoscopy  Scheduled Meds: . amLODipine  5 mg Oral Daily  . aspirin EC  81 mg Oral Daily  . atenolol  50 mg Oral BID  . busPIRone  5 mg Oral BID  . calcium-vitamin D  1 tablet Oral Daily  . cephALEXin  500 mg Oral Q12H  . cholecalciferol  1,000 Units Oral BID  .  colestipol  2 g Oral QHS  . diphenoxylate-atropine  1 tablet Oral QID  . DULoxetine  60 mg Oral BID  . fesoterodine  4 mg Oral Daily  . glycopyrrolate  2 mg Oral BID  . heparin  5,000 Units Subcutaneous Q8H  . influenza vaccine adjuvanted  0.5 mL Intramuscular Tomorrow-1000   . latanoprost  1 drop Both Eyes QHS  . levothyroxine  175 mcg Oral Q0600  . lipase/protease/amylase  24,000 Units Oral TID WC  . polyvinyl alcohol  1 drop Both Eyes BID  . potassium chloride  10 mEq Oral BID  . pravastatin  40 mg Oral q1800  . pregabalin  300 mg Oral BID  . rOPINIRole  0.5 mg Oral QHS  . sodium chloride flush  3 mL Intravenous Once   Continuous Infusions: . dextrose 5 % and 0.9 % NaCl with KCl 20 mEq/L 100 mL/hr at 03/23/19 1401   PRN Meds: betamethasone dipropionate, HYDROcodone-acetaminophen, loperamide, ondansetron **OR** ondansetron (ZOFRAN) IV, simethicone, triamcinolone ointment Antibiotics: Anti-infectives (From admission, onward)   Start     Dose/Rate Route Frequency Ordered Stop   03/22/19 1300  cephALEXin (KEFLEX) capsule 500 mg     500 mg Oral Every 12 hours 03/22/19 1242 03/25/19 0959   03/21/19 2045  metroNIDAZOLE (FLAGYL) IVPB 500 mg  Status:  Discontinued     500 mg 100 mL/hr over 60 Minutes Intravenous Every 8 hours 03/21/19 2022 03/22/19 0957   03/20/19 0800  metroNIDAZOLE (FLAGYL) IVPB 500 mg  Status:  Discontinued     500 mg 100 mL/hr over 60 Minutes Intravenous Every 8 hours 03/20/19 0311 03/21/19 2022   03/20/19 0330  cefTRIAXone (ROCEPHIN) 2 g in sodium chloride 0.9 % 100 mL IVPB  Status:  Discontinued     2 g 200 mL/hr over 30 Minutes Intravenous Daily at bedtime 03/20/19 0311 03/22/19 0957   03/20/19 0000  ciprofloxacin (CIPRO) IVPB 400 mg     400 mg 200 mL/hr over 60 Minutes Intravenous  Once 03/19/19 2355 03/20/19 0512   03/20/19 0000  metroNIDAZOLE (FLAGYL) IVPB 500 mg     500 mg 100 mL/hr over 60 Minutes Intravenous  Once 03/19/19 2355 03/20/19 0245       Objective: Physical Exam: Vitals:   03/23/19 0443 03/23/19 0847 03/23/19 1108 03/23/19 1400  BP: 138/87  (!) 159/86 (!) 159/93  Pulse: 72  79 78  Resp: 18   18  Temp: 97.8 F (36.6 C)   97.8 F (36.6 C)  TempSrc: Oral   Oral  SpO2: 96% 96%  97%  Weight:      Height:         Intake/Output Summary (Last 24 hours) at 03/23/2019 1816 Last data filed at 03/23/2019 1639 Gross per 24 hour  Intake 1159.41 ml  Output 1125 ml  Net 34.41 ml   Filed Weights   03/19/19 1522 03/20/19 0302  Weight: 65.8 kg 63.8 kg   General: alert and oriented to time, place, and person. Appear in mild distress, affect appropriate Eyes: PERRL, Conjunctiva normal ENT: Oral Mucosa Clear, moist  Neck: no JVD, no Abnormal Mass Or lumps Cardiovascular: S1 and S2 Present, no Murmur, peripheral pulses symmetrical Respiratory: good respiratory effort, Bilateral Air entry equal and Decreased, no signs of accessory muscle use, Clear to Auscultation, no Crackles, no wheezes Abdomen: Bowel Sound present, Soft and mild tenderness, no hernia Skin: no rashes  Extremities: no Pedal edema, no calf tenderness Neurologic: without any new focal findings Gait not checked  due to patient safety concerns  Data Reviewed: I have personally reviewed and interpreted daily labs, tele strips, imagings as discussed above. I reviewed all nursing notes, pharmacy notes, vitals, pertinent old records I have discussed plan of care as described above with RN and patient/family.  CBC: Recent Labs  Lab 03/19/19 1616 03/20/19 0402 03/21/19 0323 03/22/19 0311 03/23/19 0358  WBC 7.9 7.2 5.2 7.5 4.0  NEUTROABS  --   --  3.4 6.1  --   HGB 12.1 12.3 10.4* 9.7* 9.5*  HCT 37.6 38.8 33.0* 30.5* 30.2*  MCV 99.5 100.3* 102.2* 100.7* 102.0*  PLT 217 186 137* 136* [id number]*   Basic Metabolic Panel: Recent Labs  Lab 03/19/19 1616 03/20/19 0402 03/21/19 0323 03/22/19 0311 03/23/19 0358  NA 139 141 140 142 141  K 2.9* 3.3* 3.8 3.8 3.9  CL 104 105 111 112* 112*  CO2 22 20* 20* 22 22  GLUCOSE 118* 91 113* 100* 115*  BUN 34* 30* 11 6* <5*  CREATININE 1.54* 1.13* 0.72 0.64 0.72  CALCIUM 8.2* 8.0* 7.8* 8.2* 8.0*  MG  --   --  1.5* 1.7 1.7    Liver Function Tests: Recent Labs  Lab 03/20/19 0402 03/21/19 0323  03/22/19 0311  AST 33 35 25  ALT 36 31 29  ALKPHOS 151* 126 120  BILITOT 0.9 0.5 0.3  PROT 6.5 5.6* 5.3*  ALBUMIN 3.9 3.0* 2.9*   No results for input(s): LIPASE, AMYLASE in the last 168 hours. No results for input(s): AMMONIA in the last 168 hours. Coagulation Profile: No results for input(s): INR, PROTIME in the last 168 hours. Cardiac Enzymes: No results for input(s): CKTOTAL, CKMB, CKMBINDEX, TROPONINI in the last 168 hours. BNP (last 3 results) No results for input(s): PROBNP in the last 8760 hours. CBG: No results for input(s): GLUCAP in the last 168 hours. Studies: Dg Abd Portable 1v  Result Date: 03/23/2019 CLINICAL DATA:  Abdominal pain and nausea with loose stool. EXAM: PORTABLE ABDOMEN - 1 VIEW COMPARISON:  CT abdomen pelvis dated March 19, 2019. FINDINGS: The bowel gas pattern is normal. No radio-opaque calculi or other significant radiographic abnormality are seen. No acute osseous abnormality. Old right superior and inferior pubic rami fractures. Prior L3-S1 PLIF. Old L2 compression deformity. Prior cholecystectomy. IMPRESSION: Negative. Electronically Signed   By: Titus Dubin M.D.   On: 03/23/2019 14:48     Time spent: 35 minutes  Author: Berle Mull, MD [facility] 03/23/2019 6:16 PM  To reach On-call, see care teams to locate the attending and reach out to them via [email or web address]. If 7PM-7AM, please contact night-coverage If you still have difficulty reaching the attending provider, please page the [facility] (Director on Call) for [facility] on amion for assistance.

## 2019-03-24 LAB — BASIC METABOLIC PANEL
Anion gap: 6 (ref 5–15)
BUN: <5 mg/dL — ABNORMAL LOW (ref 8–23)
CO2: 22 mmol/L (ref 22–32)
Calcium: 8.2 mg/dL — ABNORMAL LOW (ref 8.9–10.3)
Chloride: 111 mmol/L (ref 98–111)
Creatinine, Ser: 0.67 mg/dL (ref 0.44–1.00)
GFR calc Af Amer: >60 mL/min (ref >60)
GFR calc non Af Amer: >60 mL/min (ref >60)
Glucose, Bld: 118 mg/dL — ABNORMAL HIGH (ref 70–99)
Potassium: 4.8 mmol/L (ref 3.5–5.1)
Sodium: 139 mmol/L (ref 135–145)

## 2019-03-24 LAB — CBC
HCT: 34 % — ABNORMAL LOW (ref 36.0–46.0)
Hemoglobin: 10.6 g/dL — ABNORMAL LOW (ref 12.0–15.0)
MCH: 31.7 pg (ref 26.0–34.0)
MCHC: 31.2 g/dL (ref 30.0–36.0)
MCV: 101.8 fL — ABNORMAL HIGH (ref 80.0–100.0)
Platelets: 110 10*3/uL — ABNORMAL LOW (ref 150–400)
RBC: 3.34 MIL/uL — ABNORMAL LOW (ref 3.87–5.11)
RDW: 14.9 % (ref 11.5–15.5)
WBC: 5.7 10*3/uL (ref 4.0–10.5)
nRBC: 0 % (ref 0.0–0.2)

## 2019-03-24 LAB — MAGNESIUM: Magnesium: 1.7 mg/dL (ref 1.7–2.4)

## 2019-03-24 MED ORDER — PANTOPRAZOLE SODIUM 40 MG PO TBEC
40.0000 mg | DELAYED_RELEASE_TABLET | Freq: Every day | ORAL | Status: DC
  Administered 2019-03-24 – 2019-03-25 (×2): 40 mg via ORAL
  Filled 2019-03-24 (×2): qty 1

## 2019-03-24 MED ORDER — DIPHENOXYLATE-ATROPINE 2.5-0.025 MG PO TABS: 1.0000 | ORAL_TABLET | Freq: Four times a day (QID) | ORAL | Status: DC | PRN

## 2019-03-24 MED ORDER — HYDRALAZINE HCL 20 MG/ML IJ SOLN
5.0000 mg | Freq: Four times a day (QID) | INTRAMUSCULAR | Status: DC | PRN
  Administered 2019-03-24 – 2019-03-30 (×5): 5 mg via INTRAVENOUS
  Filled 2019-03-24 (×5): qty 1

## 2019-03-24 MED ORDER — SIMETHICONE 80 MG PO CHEW
80.0000 mg | CHEWABLE_TABLET | Freq: Four times a day (QID) | ORAL | Status: DC
  Administered 2019-03-24 – 2019-03-31 (×26): 80 mg via ORAL
  Filled 2019-03-24 (×27): qty 1

## 2019-03-24 MED ORDER — PHENOL 1.4 % MT LIQD
1.0000 | OROMUCOSAL | Status: DC | PRN
  Administered 2019-03-24: 1 via OROMUCOSAL
  Filled 2019-03-24: qty 177

## 2019-03-24 NOTE — Progress Notes (Signed)
[facility] Gastroenterology Progress Note  [name] 78 y.o. July 03, 1940  CC: Diarrhea  Subjective: She continues to have diarrhea.  Denies any blood in the stool.  Complaining of nausea.  Denied any vomiting.  Discussed with nursing staff.  She is complaining of indigestion and reflux symptoms.  ROS : Afebrile.  Complaining of fatigue.  Denies any acute chest pain.  Objective: Vital signs in last 24 hours: Vitals:   03/24/19 0820 03/24/19 0948  BP: (!) 170/95 (!) 159/87  Pulse: 80 81  Resp:    Temp:    SpO2:      Physical Exam:  General.  Elderly appearing patient.  Not in acute distress Abdomen.  Soft, mild epigastric discomfort on palpation nondistended, bowel sounds present.  No peritoneal signs  Somewhat anxious. Neuro.  Alert/oriented x3  Lab Results: Recent Labs    03/23/19 0358 03/24/19 0336  NA 141 139  K 3.9 4.8  CL 112* 111  CO2 22 22  GLUCOSE 115* 118*  BUN <5* <5*  CREATININE 0.72 0.67  CALCIUM 8.0* 8.2*  MG 1.7 1.7   Recent Labs    03/22/19 0311  AST 25  ALT 29  ALKPHOS 120  BILITOT 0.3  PROT 5.3*  ALBUMIN 2.9*   Recent Labs    03/22/19 0311 03/23/19 0358 03/24/19 0336  WBC 7.5 4.0 5.7  NEUTROABS 6.1  --   --   HGB 9.7* 9.5* 10.6*  HCT 30.5* 30.2* 34.0*  MCV 100.7* 102.0* 101.8*  PLT 136* 101* 110*   No results for input(s): LABPROT, INR in the last 72 hours.    Assessment/Plan: -Diarrhea.  Stool study positive for Giardia.  Negative for C. difficile.   Flexible sigmoidoscopy on March 21, 2019 showed mild inflammation in the transverse colon otherwise no evidence of active colitis.  Biopsies were negative for any evidence of colitis.   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  -History of pancreatic cancer s/p Whipple surgery and radiation .  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 - GERD  Recommendations -------------------------- -She received TINIDAZOLE  Yesterday. -Continue Colestid -Start  Protonix 40 mg once a day for acid reflux. -Follow pancreatic elastase -Okay to DC antibiotics from GI standpoint -GI will follow  [name] MD, FACP 03/24/2019, 10:47 AM  Contact #  [phone]

## 2019-03-24 NOTE — Progress Notes (Signed)
[facility] Progress Note  Patient: [name] [id number]   PCP: Briscoe Deutscher, DO DOB: 1941-02-18   DOA: 03/19/2019   DOS: 03/24/2019   Date of Service: the patient was seen and examined on 03/24/2019  Chief Complaint  Patient presents with  . Dizziness   Brief hospital course: [name] is a 78 y.o. 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  Patient also has worsening diarrhea with abdominal cramping.  She has had diarrhea over the last 3 months.  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  Patient has recently been treated with Cipro and Flagyl.  She was previously tested for C. difficile and was negative.  She has been dehydrated.  Still taking Lasix and hydrochlorothiazide.  She is also noted confusion difficulty talking occasionally.  Patient's dizziness is both at rest and when moving.  In the last 48 hours she has fallen.  Primarily son is with the patient giving more history.  She appears very dehydrated and chronically ill looking.  She has not been eating well apparently.  Patient is being admitted with severe dehydration secondary to poor oral intake.  Also colitis as seen on her CT.  Currently further plan is follow GI recommendation.  Subjective: Continues to have abdominal pain but admits to abdominal distention.  Reports that 3 episodes of dry heaving.  No bowel movement so far.  Son at bedside who says that she normally goes without a bowel movement for a few days and then will start having diarrhea again this has been the pattern for last few months at home.  Assessment and Plan: acute colitis:  Giardia antigen positive. Etiology not clear.  Extremities with  gastroenterology consulted. Patient underwent endoscopy and sigmoidoscopy. Some stool sample was sent for further work-up.  C. difficile is negative. Patient will be advanced to low residue diet and will monitor. No antibiotics needed per GI. Current plan is to use Lomotil and other medication for control of diarrhea. Patient received 1 dose of 2 g. Tnidazole.  Pharmacy consulted. Given that the patient does not have any bowel movement I will discontinue scheduled Imodium and glycopyrrolate and change it to as needed only.  recurrent falls with dizziness:  Patient has gait abnormalities but this most recent falls could be related to dehydration and orthostasis.  Aggressively hydrate the patient and monitor PT OT recommends home with home health.  hypokalemia:  Hypomagnesemia Most likely from persistent diarrhea. Continue to replace as as and when needed.  acute kidney injury: Prerenal most likely from dehydration.  Aggressively hydrate and monitor renal function   hypertension:  We will hold some blood pressure medicines especially diuretics.  Continue to monitor  history of pancreatic cancer: Status post Whipple's procedure.  Continue follow-up with oncology  Neuropathy. Continue Lyrica.  Klebsiella UTI. We will treat with oral Keflex to complete total 5-day treatment course. Treatment completed.  Diet: Regular diet DVT Prophylaxis: Subcutaneous Lovenox  Advance goals of care discussion: Full code  Family Communication: no family was present at bedside, at the time of interview.  Discussed with family at bedside on 03/22/2019  Disposition:  Discharge to be determined.  Consultants: [facility]   Procedures: EGD and colonoscopy  Scheduled Meds: . amLODipine  5 mg Oral Daily  .  aspirin EC  81 mg Oral Daily  . atenolol  50 mg Oral BID  . busPIRone  5 mg Oral BID  . calcium-vitamin D  1 tablet Oral Daily  . cholecalciferol  1,000 Units Oral BID  .  colestipol  2 g Oral QHS  . DULoxetine  60 mg Oral BID  . fesoterodine  4 mg Oral Daily  . heparin  5,000 Units Subcutaneous Q8H  . influenza vaccine adjuvanted  0.5 mL Intramuscular Tomorrow-1000  . latanoprost  1 drop Both Eyes QHS  . levothyroxine  175 mcg Oral Q0600  . lipase/protease/amylase  24,000 Units Oral TID WC  . pantoprazole  40 mg Oral Daily  . polyvinyl alcohol  1 drop Both Eyes BID  . potassium chloride  10 mEq Oral BID  . pravastatin  40 mg Oral q1800  . pregabalin  300 mg Oral BID  . rOPINIRole  0.5 mg Oral QHS  . simethicone  80 mg Oral QID  . sodium chloride flush  3 mL Intravenous Once   Continuous Infusions: . dextrose 5 % and 0.9 % NaCl with KCl 20 mEq/L 100 mL/hr at 03/24/19 0600   PRN Meds: albuterol, alum & mag hydroxide-simeth, betamethasone dipropionate, diphenoxylate-atropine, famotidine, hydrALAZINE, HYDROcodone-acetaminophen, loperamide, ondansetron **OR** ondansetron (ZOFRAN) IV, triamcinolone ointment Antibiotics: Anti-infectives (From admission, onward)   Start     Dose/Rate Route Frequency Ordered Stop   03/23/19 1930  tinidazole (TINDAMAX) tablet 2,000 mg     2 g Oral  Once 03/23/19 1820 03/23/19 1959   03/22/19 1300  cephALEXin (KEFLEX) capsule 500 mg  Status:  Discontinued     500 mg Oral Every 12 hours 03/22/19 1242 03/24/19 1752   03/21/19 2045  metroNIDAZOLE (FLAGYL) IVPB 500 mg  Status:  Discontinued     500 mg 100 mL/hr over 60 Minutes Intravenous Every 8 hours 03/21/19 2022 03/22/19 0957   03/20/19 0800  metroNIDAZOLE (FLAGYL) IVPB 500 mg  Status:  Discontinued     500 mg 100 mL/hr over 60 Minutes Intravenous Every 8 hours 03/20/19 0311 03/21/19 2022   03/20/19 0330  cefTRIAXone (ROCEPHIN) 2 g in sodium chloride 0.9 % 100 mL IVPB  Status:  Discontinued     2 g 200 mL/hr over 30 Minutes Intravenous Daily at bedtime 03/20/19 0311 03/22/19 0957   03/20/19 0000  ciprofloxacin (CIPRO) IVPB 400 mg     400 mg 200 mL/hr over 60 Minutes  Intravenous  Once 03/19/19 2355 03/20/19 0512   03/20/19 0000  metroNIDAZOLE (FLAGYL) IVPB 500 mg     500 mg 100 mL/hr over 60 Minutes Intravenous  Once 03/19/19 2355 03/20/19 0245       Objective: Physical Exam: Vitals:   03/24/19 0755 03/24/19 0820 03/24/19 0948 03/24/19 1423  BP: (!) 184/91 (!) 170/95 (!) 159/87 (!) 161/81  Pulse:  80 81 78  Resp:    18  Temp:    97.6 F (36.4 C)  TempSrc:    Oral  SpO2:    99%  Weight:      Height:        Intake/Output Summary (Last 24 hours) at 03/24/2019 1752 Last data filed at 03/24/2019 1429 Gross per 24 hour  Intake 3045.79 ml  Output 2400 ml  Net 645.79 ml   Filed Weights   03/19/19 1522 03/20/19 0302  Weight: 65.8 kg 63.8 kg   General: alert and oriented to time, place, and person. Appear in mild distress, affect appropriate Eyes: PERRL, Conjunctiva normal ENT: Oral  Mucosa Clear, moist  Neck: no JVD, no Abnormal Mass Or lumps Cardiovascular: S1 and S2 Present, no Murmur, peripheral pulses symmetrical Respiratory: good respiratory effort, Bilateral Air entry equal and Decreased, no signs of accessory muscle use, Clear to Auscultation, no Crackles, no wheezes Abdomen: Bowel Sound present, Soft and mild tenderness, no hernia Skin: no rashes  Extremities: no Pedal edema, no calf tenderness Neurologic: without any new focal findings Gait not checked due to patient safety concerns  Data Reviewed: I have personally reviewed and interpreted daily labs, tele strips, imagings as discussed above. I reviewed all nursing notes, pharmacy notes, vitals, pertinent old records I have discussed plan of care as described above with RN and patient/family.  CBC: Recent Labs  Lab 03/20/19 0402 03/21/19 0323 03/22/19 0311 03/23/19 0358 03/24/19 0336  WBC 7.2 5.2 7.5 4.0 5.7  NEUTROABS  --  3.4 6.1  --   --   HGB 12.3 10.4* 9.7* 9.5* 10.6*  HCT 38.8 33.0* 30.5* 30.2* 34.0*  MCV 100.3* 102.2* 100.7* 102.0* 101.8*  PLT 186 137* 136*  101* [id number]*   Basic Metabolic Panel: Recent Labs  Lab 03/20/19 0402 03/21/19 0323 03/22/19 0311 03/23/19 0358 03/24/19 0336  NA 141 140 142 141 139  K 3.3* 3.8 3.8 3.9 4.8  CL 105 111 112* 112* 111  CO2 20* 20* 22 22 22   GLUCOSE 91 113* 100* 115* 118*  BUN 30* 11 6* <5* <5*  CREATININE 1.13* 0.72 0.64 0.72 0.67  CALCIUM 8.0* 7.8* 8.2* 8.0* 8.2*  MG  --  1.5* 1.7 1.7 1.7    Liver Function Tests: Recent Labs  Lab 03/20/19 0402 03/21/19 0323 03/22/19 0311  AST 33 35 25  ALT 36 31 29  ALKPHOS 151* 126 120  BILITOT 0.9 0.5 0.3  PROT 6.5 5.6* 5.3*  ALBUMIN 3.9 3.0* 2.9*   No results for input(s): LIPASE, AMYLASE in the last 168 hours. No results for input(s): AMMONIA in the last 168 hours. Coagulation Profile: No results for input(s): INR, PROTIME in the last 168 hours. Cardiac Enzymes: No results for input(s): CKTOTAL, CKMB, CKMBINDEX, TROPONINI in the last 168 hours. BNP (last 3 results) No results for input(s): PROBNP in the last 8760 hours. CBG: No results for input(s): GLUCAP in the last 168 hours. Studies: No results found.   Time spent: 35 minutes  Author: Berle Mull, MD [facility] 03/24/2019 5:52 PM  To reach On-call, see care teams to locate the attending and reach out to them via [email or web address]. If 7PM-7AM, please contact night-coverage If you still have difficulty reaching the attending provider, please page the [facility] (Director on Call) for [facility] on amion for assistance.

## 2019-03-25 ENCOUNTER — Inpatient Hospital Stay (HOSPITAL_COMMUNITY): Payer: Medicare Other

## 2019-03-25 LAB — BASIC METABOLIC PANEL
Anion gap: 6 (ref 5–15)
BUN: <5 mg/dL — ABNORMAL LOW (ref 8–23)
CO2: 21 mmol/L — ABNORMAL LOW (ref 22–32)
Calcium: 8.3 mg/dL — ABNORMAL LOW (ref 8.9–10.3)
Chloride: 112 mmol/L — ABNORMAL HIGH (ref 98–111)
Creatinine, Ser: 0.72 mg/dL (ref 0.44–1.00)
GFR calc Af Amer: >60 mL/min (ref >60)
GFR calc non Af Amer: >60 mL/min (ref >60)
Glucose, Bld: 132 mg/dL — ABNORMAL HIGH (ref 70–99)
Potassium: 5.1 mmol/L (ref 3.5–5.1)
Sodium: 139 mmol/L (ref 135–145)

## 2019-03-25 LAB — CBC
HCT: 33.5 % — ABNORMAL LOW (ref 36.0–46.0)
Hemoglobin: 10.6 g/dL — ABNORMAL LOW (ref 12.0–15.0)
MCH: 32 pg (ref 26.0–34.0)
MCHC: 31.6 g/dL (ref 30.0–36.0)
MCV: 101.2 fL — ABNORMAL HIGH (ref 80.0–100.0)
Platelets: 119 10*3/uL — ABNORMAL LOW (ref 150–400)
RBC: 3.31 MIL/uL — ABNORMAL LOW (ref 3.87–5.11)
RDW: 15.3 % (ref 11.5–15.5)
WBC: 7.8 10*3/uL (ref 4.0–10.5)
nRBC: 0 % (ref 0.0–0.2)

## 2019-03-25 LAB — GI PATHOGEN PANEL BY PCR, STOOL

## 2019-03-25 LAB — GIARDIA/CRYPTOSPORIDIUM EIA
Cryptosporidium EIA: NEGATIVE
Giardia Ag, Stl: POSITIVE — AB

## 2019-03-25 LAB — MAGNESIUM: Magnesium: 1.8 mg/dL (ref 1.7–2.4)

## 2019-03-25 MED ORDER — PANTOPRAZOLE SODIUM 40 MG IV SOLR
40.0000 mg | INTRAVENOUS | Status: DC
  Administered 2019-03-25 – 2019-03-31 (×7): 40 mg via INTRAVENOUS
  Filled 2019-03-25 (×6): qty 40

## 2019-03-25 MED ORDER — FUROSEMIDE 10 MG/ML IJ SOLN
40.0000 mg | Freq: Once | INTRAMUSCULAR | Status: AC
  Administered 2019-03-25: 40 mg via INTRAVENOUS
  Filled 2019-03-25: qty 4

## 2019-03-25 MED ORDER — SUCRALFATE 1 GM/10ML PO SUSP
1.0000 g | Freq: Three times a day (TID) | ORAL | Status: DC
  Administered 2019-03-25 (×2): 1 g via ORAL
  Filled 2019-03-25 (×2): qty 10

## 2019-03-25 NOTE — Progress Notes (Signed)
[facility] Progress Note  [name] 78 y.o. [birth year]  CC: Diarrhea  Subjective: Diarrhea resolved.  Complaining of worsening acid reflux and shortness of breath.  ROS : Afebrile.  Complaining of fatigue.  Complaining of shortness of breath  Objective: Vital signs in last 24 hours: Vitals:   03/25/19 0919 03/25/19 1012  BP: (!) 171/91   Pulse: 71 74  Resp:    Temp:    SpO2:      Physical Exam:  General.  Elderly appearing patient.  Not in acute distress Lungs.  Bilateral wheezing noted.  Decreased air entry Abdomen.  Soft, mild epigastric discomfort on palpation nondistended, bowel sounds present.  No peritoneal signs  Somewhat anxious. Neuro.  Alert/oriented x3  Lab Results: Recent Labs    03/24/19 0336 03/25/19 0321  NA 139 139  K 4.8 5.1  CL 111 112*  CO2 22 21*  GLUCOSE 118* 132*  BUN <5* <5*  CREATININE 0.67 0.72  CALCIUM 8.2* 8.3*  MG 1.7 1.8   No results for input(s): AST, ALT, ALKPHOS, BILITOT, PROT, ALBUMIN in the last 72 hours. Recent Labs    03/24/19 0336 03/25/19 0321  WBC 5.7 7.8  HGB 10.6* 10.6*  HCT 34.0* 33.5*  MCV 101.8* 101.2*  PLT 110* 119*   No results for input(s): LABPROT, INR in the last 72 hours.    Assessment/Plan: -Diarrhea.  Stool study positive for Giardia.  Negative for C. difficile.  Status post treatment with Tinidazole.   Flexible sigmoidoscopy on March 21, 2019 showed mild inflammation in the transverse colon otherwise no evidence of active colitis.  Biopsies were negative for any evidence of colitis.   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  -History of pancreatic cancer s/p Whipple surgery and radiation .  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 - GERD  Recommendations -------------------------- -Change p.o. Protonix to IV 40 mg once a day. -Follow pancreatic elastase -DC Colestid if starts having constipation.  [name] MD, [place] 03/25/2019, 10:50  AM  Contact #  [phone]

## 2019-03-25 NOTE — Progress Notes (Signed)
Physical Therapy Treatment Patient Details Name: [name] MRN: [id number] DOB: 30-Jul-1940 Today's Date: 03/25/2019    History of Present Illness Pt is 78 yo female admitted on 03/19/19 with colitis and falls.  She is s/p EGD and upper GI endoscopy.Stool study positive for Giardia    PT Comments    Pt is making good progress.  Able to ambulate further and with less assistance.  Demonstrating improved balance and activity tolerance.  Son was pleased with pts progress.   Follow Up Recommendations  Home health PT     Equipment Recommendations  Rolling walker with 5" wheels    Recommendations for Other Services       Precautions / Restrictions Precautions Precautions: Fall Restrictions Weight Bearing Restrictions: No    Mobility  Bed Mobility Overal bed mobility: Needs Assistance Bed Mobility: Supine to Sit     Supine to sit: Supervision        Transfers Overall transfer level: Needs assistance Equipment used: Rolling walker (2 wheeled) Transfers: Sit to/from Stand Sit to Stand: Min guard         General transfer comment: cues for safety; performed sit to stand x 2; tolieting ADLs with CGA for safety  Ambulation/Gait Ambulation/Gait assistance: Min guard Gait Distance (Feet): 80 Feet Assistive device: Rolling walker (2 wheeled)       General Gait Details: 80'x2 with 1 seated rest breaks; cues to stay close to [employer] Mobility    Modified Rankin (Stroke Patients Only)       Balance Overall balance assessment: Needs assistance Sitting-balance support: No upper extremity supported;Feet unsupported Sitting balance-Leahy Scale: Good     Standing balance support: Bilateral upper extremity supported;During functional activity Standing balance-Leahy Scale: Good Standing balance comment: standing at sink for 1 minute with SBA for balance during ADls                            Cognition  Arousal/Alertness: Awake/alert Behavior During Therapy: WFL for tasks assessed/performed Overall Cognitive Status: History of cognitive impairments - at baseline                                 General Comments: son present      Exercises      General Comments        Pertinent Vitals/Pain Pain Score: 2  Pain Location: upper abdomen Pain Descriptors / Indicators: Sore Pain Intervention(s): Repositioned    Home Living                      Prior Function            PT Goals (current goals can now be found in the care plan section) Acute Rehab PT Goals Patient Stated Goal: return home with HHPT PT Goal Formulation: With patient/family Progress towards PT goals: Progressing toward goals    Frequency    Min 2X/week      PT Plan Current plan remains appropriate    Co-evaluation              AM-PAC PT "6 Clicks" Mobility   Outcome Measure  Help needed turning from your back to your side while in a flat bed without using bedrails?: None Help needed moving from lying on your back to sitting on the  side of a flat bed without using bedrails?: None Help needed moving to and from a bed to a chair (including a wheelchair)?: A Little Help needed standing up from a chair using your arms (e.g., wheelchair or bedside chair)?: A Little Help needed to walk in hospital room?: A Little Help needed climbing 3-5 steps with a railing? : A Little 6 Click Score: 20    End of Session Equipment Utilized During Treatment: Gait belt Activity Tolerance: Patient tolerated treatment well Patient left: with call bell/phone within reach;in bed;with family/visitor present   PT Visit Diagnosis: Other abnormalities of gait and mobility (R26.89);History of falling (Z91.81);Muscle weakness (generalized) (M62.81)     Time: [id number] PT Time Calculation (min) (ACUTE ONLY): 26 min  Charges:  $Gait Training: 8-22 mins $Therapeutic Activity: 8-22 mins                      Maggie Font, PT Acute Rehab Services [phone]    Karlton Lemon 03/25/2019, 3:20 PM

## 2019-03-25 NOTE — Progress Notes (Signed)
[facility] Progress Note  Patient: [name] [id number]   PCP: Briscoe Deutscher, DO DOB: 1940-10-23   DOA: 03/19/2019   DOS: 03/25/2019   Date of Service: the patient was seen and examined on 03/25/2019  Chief Complaint  Patient presents with  . Dizziness   Brief hospital course: [name] is a 78 y.o. 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  Patient also has worsening diarrhea with abdominal cramping.  She has had diarrhea over the last 3 months.  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  Patient has recently been treated with Cipro and Flagyl.  She was previously tested for C. difficile and was negative.  She has been dehydrated.  Still taking Lasix and hydrochlorothiazide.  She is also noted confusion difficulty talking occasionally.  Patient's dizziness is both at rest and when moving.  In the last 48 hours she has fallen.  Primarily son is with the patient giving more history.  She appears very dehydrated and chronically ill looking.  She has not been eating well apparently.  Patient is being admitted with severe dehydration secondary to poor oral intake.  Also colitis as seen on her CT.  Currently further plan is follow GI recommendation.  Subjective: Continues to have abdominal pain.  No nausea no vomiting.  Has 2 loose BM.  No fever no chills.  Assessment and Plan: acute colitis:  Giardia antigen positive. Etiology not clear.  Extremities with gastroenterology consulted. Patient underwent endoscopy and sigmoidoscopy. Some stool sample was sent for further work-up.  C. difficile is negative. Patient will be advanced to low residue diet and will monitor. No antibiotics needed  per GI. Current plan is to use Lomotil and other medication for control of diarrhea. Patient received 1 dose of 2 g. Tnidazole.  Pharmacy consulted. Discontinue colestipol if the patient have constipation.  Acute volume overload. From IV fluid resuscitation. Will provide IV Lasix. As needed Xopenex.  recurrent falls with dizziness:  Patient has gait abnormalities but this most recent falls could be related to dehydration and orthostasis.  Aggressively hydrate the patient and monitor PT OT recommends home with home health.  hypokalemia:  Hypomagnesemia Most likely from persistent diarrhea. Continue to replace as as and when needed.  acute kidney injury: Prerenal most likely from dehydration.  Aggressively hydrate and monitor renal function   hypertension:  We will hold some blood pressure medicines especially diuretics.  Continue to monitor  history of pancreatic cancer: Status post Whipple's procedure.  Continue follow-up with oncology  Neuropathy. Continue Lyrica.  Klebsiella UTI. Treated with oral Keflex for complete a 5-day treatment course.  Diet: Regular diet DVT Prophylaxis: Subcutaneous Lovenox  Advance goals of care discussion: Full code  Family Communication:  Discussed with family at bedside on 03/25/2019  Disposition:  Discharge to be determined.  Consultants: [facility]   Procedures: EGD and colonoscopy  Scheduled Meds: . amLODipine  5 mg Oral Daily  . aspirin EC  81 mg Oral Daily  . atenolol  50 mg Oral BID  . busPIRone  5 mg Oral BID  . calcium-vitamin D  1 tablet Oral Daily  . cholecalciferol  1,000 Units Oral BID  . colestipol  2 g Oral QHS  . DULoxetine  60 mg Oral BID  .  fesoterodine  4 mg Oral Daily  . heparin  5,000 Units Subcutaneous Q8H  . influenza vaccine adjuvanted  0.5 mL Intramuscular Tomorrow-1000  . latanoprost  1 drop Both Eyes QHS  . levothyroxine  175 mcg Oral Q0600  . lipase/protease/amylase  24,000 Units  Oral TID WC  . pantoprazole (PROTONIX) IV  40 mg Intravenous Q24H  . polyvinyl alcohol  1 drop Both Eyes BID  . pravastatin  40 mg Oral q1800  . pregabalin  300 mg Oral BID  . rOPINIRole  0.5 mg Oral QHS  . simethicone  80 mg Oral QID  . sodium chloride flush  3 mL Intravenous Once  . sucralfate  1 g Oral TID WC & HS   Continuous Infusions:  PRN Meds: albuterol, alum & mag hydroxide-simeth, betamethasone dipropionate, diphenoxylate-atropine, famotidine, hydrALAZINE, HYDROcodone-acetaminophen, loperamide, ondansetron **OR** ondansetron (ZOFRAN) IV, phenol, triamcinolone ointment Antibiotics: Anti-infectives (From admission, onward)   Start     Dose/Rate Route Frequency Ordered Stop   03/23/19 1930  tinidazole (TINDAMAX) tablet 2,000 mg     2 g Oral  Once 03/23/19 1820 03/23/19 1959   03/22/19 1300  cephALEXin (KEFLEX) capsule 500 mg  Status:  Discontinued     500 mg Oral Every 12 hours 03/22/19 1242 03/24/19 1752   03/21/19 2045  metroNIDAZOLE (FLAGYL) IVPB 500 mg  Status:  Discontinued     500 mg 100 mL/hr over 60 Minutes Intravenous Every 8 hours 03/21/19 2022 03/22/19 0957   03/20/19 0800  metroNIDAZOLE (FLAGYL) IVPB 500 mg  Status:  Discontinued     500 mg 100 mL/hr over 60 Minutes Intravenous Every 8 hours 03/20/19 0311 03/21/19 2022   03/20/19 0330  cefTRIAXone (ROCEPHIN) 2 g in sodium chloride 0.9 % 100 mL IVPB  Status:  Discontinued     2 g 200 mL/hr over 30 Minutes Intravenous Daily at bedtime 03/20/19 0311 03/22/19 0957   03/20/19 0000  ciprofloxacin (CIPRO) IVPB 400 mg     400 mg 200 mL/hr over 60 Minutes Intravenous  Once 03/19/19 2355 03/20/19 0512   03/20/19 0000  metroNIDAZOLE (FLAGYL) IVPB 500 mg     500 mg 100 mL/hr over 60 Minutes Intravenous  Once 03/19/19 2355 03/20/19 0245       Objective: Physical Exam: Vitals:   03/25/19 0919 03/25/19 1012 03/25/19 1143 03/25/19 1350  BP: (!) 171/91   133/73  Pulse: 71 74  76  Resp:    18  Temp:    97.9 F (36.6 C)   TempSrc:    Oral  SpO2:   96% 98%  Weight:      Height:        Intake/Output Summary (Last 24 hours) at 03/25/2019 1930 Last data filed at 03/25/2019 1800 Gross per 24 hour  Intake 1180 ml  Output 4000 ml  Net -2820 ml   Filed Weights   03/19/19 1522 03/20/19 0302  Weight: 65.8 kg 63.8 kg   General: alert and oriented to time, place, and person. Appear in mild distress, affect appropriate Eyes: PERRL, Conjunctiva normal ENT: Oral Mucosa Clear, moist  Neck: no JVD, no Abnormal Mass Or lumps Cardiovascular: S1 and S2 Present, no Murmur, peripheral pulses symmetrical Respiratory: good respiratory effort, Bilateral Air entry equal and Decreased, no signs of accessory muscle use, Clear to Auscultation, no Crackles, no wheezes Abdomen: Bowel Sound present, Soft and mild tenderness, no hernia Skin: no rashes  Extremities: no Pedal edema, no calf tenderness Neurologic: without any new focal findings Gait not  checked due to patient safety concerns  Data Reviewed: I have personally reviewed and interpreted daily labs, tele strips, imagings as discussed above. I reviewed all nursing notes, pharmacy notes, vitals, pertinent old records I have discussed plan of care as described above with RN and patient/family.  CBC: Recent Labs  Lab 03/21/19 0323 03/22/19 0311 03/23/19 0358 03/24/19 0336 03/25/19 0321  WBC 5.2 7.5 4.0 5.7 7.8  NEUTROABS 3.4 6.1  --   --   --   HGB 10.4* 9.7* 9.5* 10.6* 10.6*  HCT 33.0* 30.5* 30.2* 34.0* 33.5*  MCV 102.2* 100.7* 102.0* 101.8* 101.2*  PLT 137* 136* 101* 110* [id number]*   Basic Metabolic Panel: Recent Labs  Lab 03/21/19 0323 03/22/19 0311 03/23/19 0358 03/24/19 0336 03/25/19 0321  NA 140 142 141 139 139  K 3.8 3.8 3.9 4.8 5.1  CL 111 112* 112* 111 112*  CO2 20* 22 22 22  21*  GLUCOSE 113* 100* 115* 118* 132*  BUN 11 6* <5* <5* <5*  CREATININE 0.72 0.64 0.72 0.67 0.72  CALCIUM 7.8* 8.2* 8.0* 8.2* 8.3*  MG 1.5* 1.7 1.7 1.7 1.8    Liver  Function Tests: Recent Labs  Lab 03/20/19 0402 03/21/19 0323 03/22/19 0311  AST 33 35 25  ALT 36 31 29  ALKPHOS 151* 126 120  BILITOT 0.9 0.5 0.3  PROT 6.5 5.6* 5.3*  ALBUMIN 3.9 3.0* 2.9*   No results for input(s): LIPASE, AMYLASE in the last 168 hours. No results for input(s): AMMONIA in the last 168 hours. Coagulation Profile: No results for input(s): INR, PROTIME in the last 168 hours. Cardiac Enzymes: No results for input(s): CKTOTAL, CKMB, CKMBINDEX, TROPONINI in the last 168 hours. BNP (last 3 results) No results for input(s): PROBNP in the last 8760 hours. CBG: No results for input(s): GLUCAP in the last 168 hours. Studies: Dg Chest Port 1 View  Result Date: 03/25/2019 CLINICAL DATA:  Nonproductive cough and shortness of breath 1-2 weeks. EXAM: PORTABLE CHEST 1 VIEW COMPARISON:  03/10/2017 FINDINGS: Stable elevation of the right hemidiaphragm. Mild bibasilar opacification likely atelectasis, although infection is possible. Small amount right pleural fluid. Cardiomediastinal silhouette and remainder of the exam is unchanged. IMPRESSION: Minimal bibasilar density likely atelectasis and less likely infection. Small amount right pleural fluid. Stable elevation right hemidiaphragm. Electronically Signed   By: Marin Olp M.D.   On: 03/25/2019 14:23     Time spent: 35 minutes  Author: Berle Mull, MD [facility] 03/25/2019 7:30 PM  To reach On-call, see care teams to locate the attending and reach out to them via [email or web address]. If 7PM-7AM, please contact night-coverage If you still have difficulty reaching the attending provider, please page the [facility] (Director on Call) for [facility] on amion for assistance.

## 2019-03-25 NOTE — Care Management Important Message (Signed)
Important Message  Patient Details IM Letter given to Velva Harman RN to present to the Patient Name: [name] MRN: [id number] Date of Birth: 1941/04/21   Medicare Important Message Given:  Yes     Kerin Salen 03/25/2019, 10:03 AM

## 2019-03-26 LAB — DIFFERENTIAL
Abs Immature Granulocytes: 0.07 10*3/uL (ref 0.00–0.07)
Basophils Absolute: 0 10*3/uL (ref 0.0–0.1)
Basophils Relative: 0 %
Eosinophils Absolute: 0.1 10*3/uL (ref 0.0–0.5)
Eosinophils Relative: 1 %
Immature Granulocytes: 1 %
Lymphocytes Relative: 7 %
Lymphs Abs: 0.8 10*3/uL (ref 0.7–4.0)
Monocytes Absolute: 0.4 10*3/uL (ref 0.1–1.0)
Monocytes Relative: 3 %
Neutro Abs: 10.2 10*3/uL — ABNORMAL HIGH (ref 1.7–7.7)
Neutrophils Relative %: 88 %

## 2019-03-26 LAB — HEPATIC FUNCTION PANEL
ALT: 19 U/L (ref 0–44)
AST: 22 U/L (ref 15–41)
Albumin: 3.1 g/dL — ABNORMAL LOW (ref 3.5–5.0)
Alkaline Phosphatase: 154 U/L — ABNORMAL HIGH (ref 38–126)
Bilirubin, Direct: 0.2 mg/dL (ref 0.0–0.2)
Indirect Bilirubin: 0.3 mg/dL (ref 0.3–0.9)
Total Bilirubin: 0.5 mg/dL (ref 0.3–1.2)
Total Protein: 5.6 g/dL — ABNORMAL LOW (ref 6.5–8.1)

## 2019-03-26 LAB — BASIC METABOLIC PANEL
Anion gap: 8 (ref 5–15)
BUN: <5 mg/dL — ABNORMAL LOW (ref 8–23)
CO2: 23 mmol/L (ref 22–32)
Calcium: 8.7 mg/dL — ABNORMAL LOW (ref 8.9–10.3)
Chloride: 105 mmol/L (ref 98–111)
Creatinine, Ser: 0.76 mg/dL (ref 0.44–1.00)
GFR calc Af Amer: >60 mL/min (ref >60)
GFR calc non Af Amer: >60 mL/min (ref >60)
Glucose, Bld: 108 mg/dL — ABNORMAL HIGH (ref 70–99)
Potassium: 4.3 mmol/L (ref 3.5–5.1)
Sodium: 136 mmol/L (ref 135–145)

## 2019-03-26 LAB — CBC
HCT: 39.9 % (ref 36.0–46.0)
Hemoglobin: 12.8 g/dL (ref 12.0–15.0)
MCH: 32.2 pg (ref 26.0–34.0)
MCHC: 32.1 g/dL (ref 30.0–36.0)
MCV: 100.3 fL — ABNORMAL HIGH (ref 80.0–100.0)
Platelets: 139 10*3/uL — ABNORMAL LOW (ref 150–400)
RBC: 3.98 MIL/uL (ref 3.87–5.11)
RDW: 15.7 % — ABNORMAL HIGH (ref 11.5–15.5)
WBC: 11.1 10*3/uL — ABNORMAL HIGH (ref 4.0–10.5)
nRBC: 0 % (ref 0.0–0.2)

## 2019-03-26 LAB — MAGNESIUM: Magnesium: 1.8 mg/dL (ref 1.7–2.4)

## 2019-03-26 MED ORDER — DIPHENHYDRAMINE HCL 50 MG/ML IJ SOLN
25.0000 mg | Freq: Once | INTRAMUSCULAR | Status: AC
  Administered 2019-03-26: 04:00:00 25 mg via INTRAVENOUS
  Filled 2019-03-26: qty 1

## 2019-03-26 MED ORDER — DIPHENHYDRAMINE HCL 50 MG/ML IJ SOLN: 12.5000 mg | Freq: Four times a day (QID) | INTRAMUSCULAR | Status: DC

## 2019-03-26 MED ORDER — ALBUTEROL SULFATE (2.5 MG/3ML) 0.083% IN NEBU
2.5000 mg | INHALATION_SOLUTION | Freq: Two times a day (BID) | RESPIRATORY_TRACT | Status: DC
  Administered 2019-03-26 – 2019-03-30 (×8): 2.5 mg via RESPIRATORY_TRACT
  Filled 2019-03-26 (×8): qty 3

## 2019-03-26 MED ORDER — DIPHENHYDRAMINE HCL 50 MG/ML IJ SOLN
INTRAMUSCULAR | Status: AC
  Filled 2019-03-26: qty 1

## 2019-03-26 MED ORDER — FAMOTIDINE IN NACL 20-0.9 MG/50ML-% IV SOLN
20.0000 mg | Freq: Two times a day (BID) | INTRAVENOUS | Status: DC
  Administered 2019-03-26 – 2019-03-31 (×11): 20 mg via INTRAVENOUS
  Filled 2019-03-26 (×11): qty 50

## 2019-03-26 MED ORDER — LORATADINE 10 MG PO TABS
10.0000 mg | ORAL_TABLET | Freq: Every day | ORAL | Status: DC
  Administered 2019-03-26 – 2019-03-31 (×6): 10 mg via ORAL
  Filled 2019-03-26 (×6): qty 1

## 2019-03-26 MED ORDER — TRIAMCINOLONE ACETONIDE 0.5 % EX OINT
1.0000 "application " | TOPICAL_OINTMENT | Freq: Two times a day (BID) | CUTANEOUS | Status: DC
  Administered 2019-03-26 – 2019-03-31 (×5): 1 via TOPICAL
  Filled 2019-03-26: qty 15

## 2019-03-26 MED ORDER — DIPHENHYDRAMINE HCL 50 MG/ML IJ SOLN
25.0000 mg | Freq: Once | INTRAMUSCULAR | Status: AC
  Administered 2019-03-26: 05:00:00 25 mg via INTRAVENOUS

## 2019-03-26 NOTE — Progress Notes (Signed)
Ambulated patient to restroom. Assessed her skin and patient seems to be having an allergic reaction. She has hives on her chest, neck, arms, back, and groin area. Patient states she is itchy, no complaints of shortness. NP Baltazar Najjar was notified. Will continue to monitor patient.

## 2019-03-26 NOTE — Progress Notes (Signed)
[facility] Progress Note  Patient: [name] [id number]   PCP: Briscoe Deutscher, DO DOB: [birth year]   DOA: 03/19/2019   DOS: 03/26/2019   Date of Service: the patient was seen and examined on 03/26/2019  Chief Complaint  Patient presents with  . Dizziness   Brief hospital course: SHYRA KITSMILLER is a 78 y.o. 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  Patient also has worsening diarrhea with abdominal cramping.  She has had diarrhea over the last 3 months.  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  Patient has recently been treated with Cipro and Flagyl.  She was previously tested for C. difficile and was negative.  She has been dehydrated.  Still taking Lasix and hydrochlorothiazide.  She is also noted confusion difficulty talking occasionally.  Patient's dizziness is both at rest and when moving.  In the last 48 hours she has fallen.  Primarily son is with the patient giving more history.  She appears very dehydrated and chronically ill looking.  She has not been eating well apparently.  Patient is being admitted with severe dehydration secondary to poor oral intake.  Also colitis as seen on her CT.  Currently further plan is follow GI recommendation.  Subjective: Continues to have abdominal pain.  No nausea no vomiting.  Has 2 loose BM.  No fever no chills.  Assessment and Plan: acute colitis:  Giardia antigen positive. Etiology not clear.  Extremities with gastroenterology consulted. Patient underwent endoscopy and sigmoidoscopy. Some stool sample was sent for further work-up.  C. difficile is negative. Patient will be advanced to low residue diet and will monitor. No antibiotics needed  per GI. Current plan is to use Lomotil and other medication for control of diarrhea. Patient received 1 dose of 2 g. Tnidazole.  Pharmacy consulted. Discontinue colestipol and carafete  Acute volume overload. From IV fluid resuscitation. Given IV Lasix. As needed Xopenex.  recurrent falls with dizziness:  Patient has gait abnormalities but this most recent falls could be related to dehydration and orthostasis.  Aggressively hydrate the patient and monitor PT OT recommends home with home health.  hypokalemia:  Hypomagnesemia Most likely from persistent diarrhea. Continue to replace as as and when needed.  acute kidney injury: Prerenal most likely from dehydration.  Aggressively hydrate and monitor renal function   hypertension:  We will hold some blood pressure medicines especially diuretics.  Continue to monitor  history of pancreatic cancer: Status post Whipple's procedure.  Continue follow-up with oncology  Neuropathy. Continue Lyrica.  Klebsiella UTI. Treated with oral Keflex for complete a 5-day treatment course.  Generalized rash pink rash which is widespread to the trunk, arms, perineal area and thighs. The rash on her upper arms in purple in color. Her hands are red and swollen. No areas of peeling skin noted. She has some perioral reddening. No ulcers inside mouth. No signs of conjunctivitis. No increased WOB, stridor of SOB.  pt was taking Rocephin here from 10/21-10/23. Then Keflex from 10/23-10/25. Hx reveals that she was on Cipro and Flagyl before admission but after searching chart, it appears that was in August. She is not on Allopurinol, seizure meds. She is on Buspar/Cymbalta since September per chart.  On carafate and colestipod also got lasix. Now all stopped.  not responsive to 2 doses of IV Benadryl 03/26/2019 early morning Called [facility] hospital and spoke to dermatology who doesn't think this is SJS given the present presentation.  Repeat  call back to derm to see if pt will benefit from steroids.   Diet: Regular diet DVT Prophylaxis: Subcutaneous Lovenox  Advance goals of care discussion: Full code  Family Communication:  Discussed with family at bedside on 03/25/2019  Disposition:  Discharge to be determined.  Consultants: [facility]   Procedures: EGD and colonoscopy  Scheduled Meds: . albuterol  2.5 mg Nebulization BID  . amLODipine  5 mg Oral Daily  . aspirin EC  81 mg Oral Daily  . atenolol  50 mg Oral BID  . busPIRone  5 mg Oral BID  . calcium-vitamin D  1 tablet Oral Daily  . cholecalciferol  1,000 Units Oral BID  . diphenhydrAMINE      . diphenhydrAMINE      . DULoxetine  60 mg Oral BID  . fesoterodine  4 mg Oral Daily  . heparin  5,000 Units Subcutaneous Q8H  . influenza vaccine adjuvanted  0.5 mL Intramuscular Tomorrow-1000  . latanoprost  1 drop Both Eyes QHS  . levothyroxine  175 mcg Oral Q0600  . lipase/protease/amylase  24,000 Units Oral TID WC  . pantoprazole (PROTONIX) IV  40 mg Intravenous Q24H  . polyvinyl alcohol  1 drop Both Eyes BID  . pravastatin  40 mg Oral q1800  . pregabalin  300 mg Oral BID  . rOPINIRole  0.5 mg Oral QHS  . simethicone  80 mg Oral QID  . sodium chloride flush  3 mL Intravenous Once   Continuous Infusions:  PRN Meds: albuterol, alum & mag hydroxide-simeth, betamethasone dipropionate, diphenoxylate-atropine, famotidine, hydrALAZINE, HYDROcodone-acetaminophen, loperamide, ondansetron **OR** ondansetron (ZOFRAN) IV, phenol, triamcinolone ointment Antibiotics: Anti-infectives (From admission, onward)   Start     Dose/Rate Route Frequency Ordered Stop   03/23/19 1930  tinidazole (TINDAMAX) tablet 2,000 mg     2 g Oral  Once 03/23/19 1820 03/23/19 1959   03/22/19 1300  cephALEXin (KEFLEX) capsule 500 mg  Status:  Discontinued     500 mg Oral Every 12 hours 03/22/19 1242 03/24/19 1752   03/21/19 2045  metroNIDAZOLE (FLAGYL) IVPB 500 mg  Status:   Discontinued     500 mg 100 mL/hr over 60 Minutes Intravenous Every 8 hours 03/21/19 2022 03/22/19 0957   03/20/19 0800  metroNIDAZOLE (FLAGYL) IVPB 500 mg  Status:  Discontinued     500 mg 100 mL/hr over 60 Minutes Intravenous Every 8 hours 03/20/19 0311 03/21/19 2022   03/20/19 0330  cefTRIAXone (ROCEPHIN) 2 g in sodium chloride 0.9 % 100 mL IVPB  Status:  Discontinued     2 g 200 mL/hr over 30 Minutes Intravenous Daily at bedtime 03/20/19 0311 03/22/19 0957   03/20/19 0000  ciprofloxacin (CIPRO) IVPB 400 mg     400 mg 200 mL/hr over 60 Minutes Intravenous  Once 03/19/19 2355 03/20/19 0512   03/20/19 0000  metroNIDAZOLE (FLAGYL) IVPB 500 mg     500 mg 100 mL/hr over 60 Minutes Intravenous  Once 03/19/19 2355 03/20/19 0245       Objective: Physical Exam: Vitals:   03/25/19 2009 03/26/19 0500 03/26/19 0500 03/26/19 1103  BP: 138/69  (!) 157/92   Pulse: 81  63   Resp: 16  18   Temp: 98.4 F (36.9 C)  98.3 F (36.8 C)   TempSrc: Oral  SpO2: 97% 97%  97%  Weight:      Height:        Intake/Output Summary (Last 24 hours) at 03/26/2019 1129 Last data filed at 03/26/2019 1056 Gross per 24 hour  Intake 780 ml  Output 4850 ml  Net -4070 ml   Filed Weights   03/19/19 1522 03/20/19 0302  Weight: 65.8 kg 63.8 kg   General: alert and oriented to time, place, and person. Appear in moderate distress, affect anxious Eyes: PERRL, Conjunctiva normal ENT: Oral Mucosa Clear, dry  Neck: no JVD, no Abnormal Mass Or lumps Cardiovascular: S1 and S2 Present, no Murmur, peripheral pulses symmetrical Respiratory: good respiratory effort, Bilateral Air entry equal and Decreased, no signs of accessory muscle use, Occasional  Crackles, no wheezes Abdomen: Bowel Sound present, Soft and mild diffuse tenderness, no hernia Skin: erythema - scalp, neck, torso, back, abdomen, trunk, groin, knee(s) right, with swelling and warmth of affected areas.  Extremities: no Pedal edema, no calf tenderness  Neurologic: without any new focal findings Gait not checked due to patient safety concerns   Left hand     Left forearm    Right neck   Right Hand      Data Reviewed: I have personally reviewed and interpreted daily labs, tele strips, imagings as discussed above. I reviewed all nursing notes, pharmacy notes, vitals, pertinent old records I have discussed plan of care as described above with RN and patient/family.  CBC: Recent Labs  Lab 03/21/19 0323 03/22/19 0311 03/23/19 0358 03/24/19 0336 03/25/19 0321 03/26/19 0339  WBC 5.2 7.5 4.0 5.7 7.8 11.1*  NEUTROABS 3.4 6.1  --   --   --   --   HGB 10.4* 9.7* 9.5* 10.6* 10.6* 12.8  HCT 33.0* 30.5* 30.2* 34.0* 33.5* 39.9  MCV 102.2* 100.7* 102.0* 101.8* 101.2* 100.3*  PLT 137* 136* 101* 110* 119* [id number]*   Basic Metabolic Panel: Recent Labs  Lab 03/22/19 0311 03/23/19 0358 03/24/19 0336 03/25/19 0321 03/26/19 0339  NA 142 141 139 139 136  K 3.8 3.9 4.8 5.1 4.3  CL 112* 112* 111 112* 105  CO2 22 22 22  21* 23  GLUCOSE 100* 115* 118* 132* 108*  BUN 6* <5* <5* <5* <5*  CREATININE 0.64 0.72 0.67 0.72 0.76  CALCIUM 8.2* 8.0* 8.2* 8.3* 8.7*  MG 1.7 1.7 1.7 1.8 1.8    Liver Function Tests: Recent Labs  Lab 03/20/19 0402 03/21/19 0323 03/22/19 0311  AST 33 35 25  ALT 36 31 29  ALKPHOS 151* 126 120  BILITOT 0.9 0.5 0.3  PROT 6.5 5.6* 5.3*  ALBUMIN 3.9 3.0* 2.9*   No results for input(s): LIPASE, AMYLASE in the last 168 hours. No results for input(s): AMMONIA in the last 168 hours. Coagulation Profile: No results for input(s): INR, PROTIME in the last 168 hours. Cardiac Enzymes: No results for input(s): CKTOTAL, CKMB, CKMBINDEX, TROPONINI in the last 168 hours. BNP (last 3 results) No results for input(s): PROBNP in the last 8760 hours. CBG: No results for input(s): GLUCAP in the last 168 hours. Studies: Dg Chest Port 1 View  Result Date: 03/25/2019 CLINICAL DATA:  Nonproductive cough and shortness  of breath 1-2 weeks. EXAM: PORTABLE CHEST 1 VIEW COMPARISON:  03/10/2017 FINDINGS: Stable elevation of the right hemidiaphragm. Mild bibasilar opacification likely atelectasis, although infection is possible. Small amount right pleural fluid. Cardiomediastinal silhouette and remainder of the exam is unchanged. IMPRESSION: Minimal bibasilar density likely atelectasis and less likely infection. Small amount right  pleural fluid. Stable elevation right hemidiaphragm. Electronically Signed   By: Marin Olp M.D.   On: 03/25/2019 14:23     Time spent: 35 minutes  Author: Berle Mull, MD [facility] 03/26/2019 11:29 AM  To reach On-call, see care teams to locate the attending and reach out to them via [email or web address]. If 7PM-7AM, please contact night-coverage If you still have difficulty reaching the attending provider, please page the [facility] (Director on Call) for [facility] on amion for assistance.

## 2019-03-26 NOTE — Progress Notes (Signed)
Transferred pt to [facility]. Gave report to [employer]. No acute distress at this time.

## 2019-03-26 NOTE — Progress Notes (Signed)
NP called by RN earlier in shift because pt was breaking out in hives and itching. No new meds today. Ordered Benadryl. It took awhile to get Benadryl due to losing pt's IV. After Benadryl given, pt's rash was worsening and now complaining of pain in addition to itching. NP to see pt. S: says rash is very painful. + itching. Never had a rash like this before. O: Chronically ill appearing female in NAD. VSS. Afebrile. She appears miserable. Alert. She has a pink rash which is widespread to the trunk, arms, perineal area and thighs. The rash on her upper arms in purple in color. Her hands are red and swollen. No areas of peeling skin noted. She has some perioral reddening. No ulcers inside mouth. No signs of conjunctivitis. No increased WOB, stridor of SOB.  A/P: 1. Rash, not responsive to 2 doses of IV Benadryl. NP suspicious for SJS. Called Dr. Alcario Drought to bedside who agreed this could be SJS. [facility] and spoke to a dermatology intern/resident. Gave him the hx and answered his questions. We reviewed the pt's meds, both here and at home. He will reach out to the derm attending and give [place] a call back.  In review of the chart, pt was taking Rocephin here from 10/21-10/23. Then Keflex from 10/23-10/25. Hx reveals that she was on Cipro and Flagyl before admission but after searching chart, it appears that was in August. She is not on Allopurinol, seizure meds. She is on Buspar/Cymbalta since September per chart.   NP received a call back from resident at [facility]. He discussed case with the attending who doesn't think this is SJS given the present presentation.   Will report off to [facility] attending this am and defer further tx to him.  KJKG, NP [facility]

## 2019-03-26 NOTE — Progress Notes (Signed)
[facility] Progress Note  [name] 78 y.o. 1940-12-04  CC: Diarrhea  Subjective: Patient events noted.  Patient has developed diffuse rash.  Somewhat confused after receiving Benadryl.  Discussed with nursing staff.  Patient did not had any nausea, vomiting abdominal pain overnight.  No bowel movement this morning.  ROS : Not able to obtain  Objective: Vital signs in last 24 hours: Vitals:   03/26/19 0500 03/26/19 0500  BP:  (!) 157/92  Pulse:  63  Resp:  18  Temp:  98.3 F (36.8 C)  SpO2: 97%     Physical Exam:  General.  Elderly appearing patient.  Not in acute distress Lungs.  Bilateral wheezing noted.  Decreased air entry Abdomen.  Soft, nontender, nondistended, bowel sounds present.  No peritoneal signs  Somewhat anxious. Neuro.  Alert/oriented x3  Lab Results: Recent Labs    03/25/19 0321 03/26/19 0339  NA 139 136  K 5.1 4.3  CL 112* 105  CO2 21* 23  GLUCOSE 132* 108*  BUN <5* <5*  CREATININE 0.72 0.76  CALCIUM 8.3* 8.7*  MG 1.8 1.8   No results for input(s): AST, ALT, ALKPHOS, BILITOT, PROT, ALBUMIN in the last 72 hours. Recent Labs    03/25/19 0321 03/26/19 0339  WBC 7.8 11.1*  HGB 10.6* 12.8  HCT 33.5* 39.9  MCV 101.2* 100.3*  PLT 119* 139*   No results for input(s): LABPROT, INR in the last 72 hours.    Assessment/Plan: -Diarrhea.  Stool study positive for Giardia.  Negative for C. difficile.  Status post treatment with Tinidazole.  -New onset rash. -   Flexible sigmoidoscopy on March 21, 2019 showed mild inflammation in the transverse colon otherwise no evidence of active colitis.  Biopsies were negative for any evidence of colitis.   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  -History of pancreatic cancer s/p Whipple surgery and radiation .  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 -Continue IV PPI for now. -Consider repeat CT abdomen pelvis for follow-up on colitis and for further evaluation of epigastric discomfort after improvement in rash. -GI will follow  [name] MD, [place] 03/26/2019, 9:38 AM  Contact #  [phone]

## 2019-03-26 NOTE — Progress Notes (Signed)
Verbal order by NP Baltazar Najjar for Benadryl 25 mg IV once.

## 2019-03-27 ENCOUNTER — Inpatient Hospital Stay (HOSPITAL_COMMUNITY): Payer: Medicare Other

## 2019-03-27 DIAGNOSIS — K219 Gastro-esophageal reflux disease without esophagitis: Secondary | ICD-10-CM

## 2019-03-27 DIAGNOSIS — N179 Acute kidney failure, unspecified: Secondary | ICD-10-CM

## 2019-03-27 DIAGNOSIS — W19XXXA Unspecified fall, initial encounter: Secondary | ICD-10-CM

## 2019-03-27 DIAGNOSIS — Y92009 Unspecified place in unspecified non-institutional (private) residence as the place of occurrence of the external cause: Secondary | ICD-10-CM

## 2019-03-27 DIAGNOSIS — E039 Hypothyroidism, unspecified: Secondary | ICD-10-CM

## 2019-03-27 LAB — BASIC METABOLIC PANEL
Anion gap: 9 (ref 5–15)
BUN: 5 mg/dL — ABNORMAL LOW (ref 8–23)
CO2: 25 mmol/L (ref 22–32)
Calcium: 8.4 mg/dL — ABNORMAL LOW (ref 8.9–10.3)
Chloride: 102 mmol/L (ref 98–111)
Creatinine, Ser: 0.88 mg/dL (ref 0.44–1.00)
GFR calc Af Amer: >60 mL/min (ref >60)
GFR calc non Af Amer: >60 mL/min (ref >60)
Glucose, Bld: 100 mg/dL — ABNORMAL HIGH (ref 70–99)
Potassium: 4.4 mmol/L (ref 3.5–5.1)
Sodium: 136 mmol/L (ref 135–145)

## 2019-03-27 LAB — CBC
HCT: 33.6 % — ABNORMAL LOW (ref 36.0–46.0)
Hemoglobin: 10.8 g/dL — ABNORMAL LOW (ref 12.0–15.0)
MCH: 31.8 pg (ref 26.0–34.0)
MCHC: 32.1 g/dL (ref 30.0–36.0)
MCV: 98.8 fL (ref 80.0–100.0)
Platelets: 116 10*3/uL — ABNORMAL LOW (ref 150–400)
RBC: 3.4 MIL/uL — ABNORMAL LOW (ref 3.87–5.11)
RDW: 15.9 % — ABNORMAL HIGH (ref 11.5–15.5)
WBC: 6.5 10*3/uL (ref 4.0–10.5)
nRBC: 0 % (ref 0.0–0.2)

## 2019-03-27 LAB — MAGNESIUM: Magnesium: 1.8 mg/dL (ref 1.7–2.4)

## 2019-03-27 MED ORDER — DIPHENHYDRAMINE HCL 12.5 MG/5ML PO ELIX
12.5000 mg | ORAL_SOLUTION | Freq: Once | ORAL | Status: AC
  Administered 2019-03-27: 12.5 mg via ORAL
  Filled 2019-03-27: qty 5

## 2019-03-27 MED ORDER — IOHEXOL 300 MG/ML  SOLN
100.0000 mL | Freq: Once | INTRAMUSCULAR | Status: AC | PRN
  Administered 2019-03-27: 17:00:00 100 mL via INTRAVENOUS

## 2019-03-27 MED ORDER — IOHEXOL 300 MG/ML  SOLN
15.0000 mL | Freq: Two times a day (BID) | INTRAMUSCULAR | Status: AC
  Administered 2019-03-27: 30 mL via ORAL

## 2019-03-27 NOTE — Progress Notes (Addendum)
PROGRESS NOTE    [name]  [id number] DOB: 21-Jan-1941 DOA: 03/19/2019 PCP: Briscoe Deutscher, DO   Brief Narrative: [name] is a 78 y.o. 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 Patient presented secondary to multiple falls and found to have evidence of colitis on imaging.   Assessment & Plan:   Principal Problem:   Acute colitis Active Problems:   Hypothyroidism, on Levothryoxine   Essential hypertension   GERD, on Prilosec   Osteoarthritis, mulitple sites   Carcinoma of head of pancreas ([place]), followed by Dr [name], Oncology   HLD (hyperlipidemia), on Lovastatin   Diarrhea   Fall at home, initial encounter   Hypokalemia   Acute renal failure (ARF) ([place])   Acute colitis Unclear etiology. Giardia antigen positive. GI consulted. EGD and sigmoidoscopy. C. Difficile negative. Overall some improvement but still with tenderness. Patient received tinidazole. -GI recommendations: repeat CT scan today  Acute volume overload From initial fluid resuscitation requiring IV lasix. Appears euvolemic now.  Rash Urticarial appearing. Scattered pattern. No desquamation. Has remained unchanged per patient but appears somewhat improved from previous pictures.  Recurrent falls Dizziness Thought to be secondary to orthostasis from dehydration. PT recommending HHPT and RW.  Hypokalemia Hypomagnesemia Replete as needed.  AKI Creatinine of 1.54 on admission which improved with IV fluids.  Chest burning Sounds likely to be reflux related. -Continue Protonix  Essential hypertension Slightly uncontrolled -Continue to monitor  History of pancreatic cancer Patient is s/p Whipple procedure. Outpatient oncology management  Neuropathy -Continue Lyrica   Thrombocytopenia Chronic and stable.  Klebsiella UTI Completed antibiotics with Keflex   DVT prophylaxis: Heparin Code Status:   Code Status: Full Code Family Communication: None at bedside Disposition Plan: Discharge pending GI recommendations   Consultants:   [facility]  Procedures:   Sigmoidoscopy  EGD  Antimicrobials:  Tinidazole     Subjective: Bowel movement today. Burning sensation of chest/throat intermittently.   Objective: Vitals:   03/26/19 2019 03/26/19 2048 03/27/19 0444 03/27/19 0811  BP: 140/69  (!) 143/65   Pulse: 72  67 64  Resp: 20  18 18   Temp: 98 F (36.7 C)  98.7 F (37.1 C)   TempSrc:   Oral   SpO2: 98% 100% 100% 100%  Weight:      Height:        Intake/Output Summary (Last 24 hours) at 03/27/2019 0949 Last data filed at 03/27/2019 0600 Gross per 24 hour  Intake 100 ml  Output 850 ml  Net -750 ml   Filed Weights   03/19/19 1522 03/20/19 0302  Weight: 65.8 kg 63.8 kg    Examination:  General exam: Appears calm and comfortable Respiratory system: Clear to auscultation. Respiratory effort normal. Cardiovascular system: S1 & S2 heard, RRR. No murmurs, rubs, gallops or clicks. Gastrointestinal system: Abdomen is distended, soft and generally tender in lower/mid areas. No organomegaly or masses felt. Normal bowel sounds heard. Central nervous system: Alert and oriented. No focal neurological deficits. Extremities: No edema. No calf tenderness Skin: No cyanosis. No rashes Psychiatry: Judgement and insight appear normal. Mood & affect appropriate.     Data Reviewed: I have personally reviewed following labs and imaging studies  CBC: Recent Labs  Lab 03/21/19 0323 03/22/19 0311 03/23/19 0358 03/24/19 0336 03/25/19 0321 03/26/19 0339 03/27/19 0434  WBC 5.2 7.5 4.0 5.7 7.8 11.1* 6.5  NEUTROABS  3.4 6.1  --   --   --  10.2*  --   HGB 10.4* 9.7* 9.5* 10.6* 10.6* 12.8 10.8*  HCT 33.0* 30.5* 30.2* 34.0* 33.5* 39.9 33.6*  MCV  102.2* 100.7* 102.0* 101.8* 101.2* 100.3* 98.8  PLT 137* 136* 101* 110* 119* 139* [id number]*   Basic Metabolic Panel: Recent Labs  Lab 03/23/19 0358 03/24/19 0336 03/25/19 0321 03/26/19 0339 03/27/19 0434  NA 141 139 139 136 136  K 3.9 4.8 5.1 4.3 4.4  CL 112* 111 112* 105 102  CO2 22 22 21* 23 25  GLUCOSE 115* 118* 132* 108* 100*  BUN <5* <5* <5* <5* 5*  CREATININE 0.72 0.67 0.72 0.76 0.88  CALCIUM 8.0* 8.2* 8.3* 8.7* 8.4*  MG 1.7 1.7 1.8 1.8 1.8   GFR: Estimated Creatinine Clearance: 44.4 mL/min (by C-G formula based on SCr of 0.88 mg/dL). Liver Function Tests: Recent Labs  Lab 03/21/19 0323 03/22/19 0311 03/26/19 0339  AST 35 25 22  ALT 31 29 19   ALKPHOS 126 120 154*  BILITOT 0.5 0.3 0.5  PROT 5.6* 5.3* 5.6*  ALBUMIN 3.0* 2.9* 3.1*   No results for input(s): LIPASE, AMYLASE in the last 168 hours. No results for input(s): AMMONIA in the last 168 hours. Coagulation Profile: No results for input(s): INR, PROTIME in the last 168 hours. Cardiac Enzymes: No results for input(s): CKTOTAL, CKMB, CKMBINDEX, TROPONINI in the last 168 hours. BNP (last 3 results) No results for input(s): PROBNP in the last 8760 hours. HbA1C: No results for input(s): HGBA1C in the last 72 hours. CBG: No results for input(s): GLUCAP in the last 168 hours. Lipid Profile: No results for input(s): CHOL, HDL, LDLCALC, TRIG, CHOLHDL, LDLDIRECT in the last 72 hours. Thyroid Function Tests: No results for input(s): TSH, T4TOTAL, FREET4, T3FREE, THYROIDAB in the last 72 hours. Anemia Panel: No results for input(s): VITAMINB12, FOLATE, FERRITIN, TIBC, IRON, RETICCTPCT in the last 72 hours. Sepsis Labs: Recent Labs  Lab 03/21/19 0323  LATICACIDVEN 2.0*    Recent Results (from the past 240 hour(s))  Novel Coronavirus, NAA (Hosp order, Send-out to Ref Lab; TAT 18-24 hrs     Status: None   Collection Time: 03/18/19 11:51 AM   Specimen: Nasopharyngeal Swab; Respiratory  Result Value Ref Range Status    SARS-CoV-2, NAA NOT DETECTED NOT DETECTED Final    Comment: (NOTE) This nucleic acid amplification test was developed and its performance characteristics determined by [employer]. Nucleic acid amplification tests include PCR and TMA. This test has not been FDA cleared or approved. This test has been authorized by FDA under an Emergency Use Authorization (EUA). This test is only authorized for the duration of time the declaration that circumstances exist justifying the authorization of the emergency use of in vitro diagnostic tests for detection of SARS-CoV-2 virus and/or diagnosis of COVID-19 infection under section 564(b)(1) of the Act, 21 U.S.C. [id number]) (1), unless the authorization is terminated or revoked sooner. When diagnostic testing is negative, the possibility of a false negative result should be considered in the context of a patient's recent exposures and the presence of clinical signs and symptoms consistent with COVID-19. An individual without symptoms of COVID- 19 and who is not shedding SARS-CoV-2 vi rus would expect to have a negative (not detected) result in this assay. Performed At: [facility] [address] [place], [place] [id number] Rush Farmer MD [id number]    [place]  Final    Comment: Performed at [facility],  [address], [place], [place] [address]  Urine culture     Status: Abnormal   Collection Time: 03/19/19  7:37 PM   Specimen: Urine, Random  Result Value Ref Range Status   Specimen Description   Final    URINE, RANDOM Performed at [facility] [address] [place], [place] [address]    Special Requests   Final    NONE Performed at [facility], [place] [address], [place], [place] [address]    Culture >=100,000 COLONIES/mL KLEBSIELLA PNEUMONIAE (A)  Final   Report Status 03/21/2019 FINAL  Final   Organism ID, Bacteria KLEBSIELLA PNEUMONIAE  (A)  Final      Susceptibility   Klebsiella pneumoniae - MIC*    AMPICILLIN RESISTANT Resistant     CEFAZOLIN <=4 SENSITIVE Sensitive     CEFTRIAXONE <=1 SENSITIVE Sensitive     CIPROFLOXACIN <=0.25 SENSITIVE Sensitive     GENTAMICIN <=1 SENSITIVE Sensitive     IMIPENEM <=0.25 SENSITIVE Sensitive     NITROFURANTOIN <=16 SENSITIVE Sensitive     TRIMETH/SULFA <=20 SENSITIVE Sensitive     AMPICILLIN/SULBACTAM 4 SENSITIVE Sensitive     PIP/TAZO 8 SENSITIVE Sensitive     Extended ESBL NEGATIVE Sensitive     * >=100,000 COLONIES/mL KLEBSIELLA PNEUMONIAE  SARS CORONAVIRUS 2 (TAT 6-24 HRS) Nasopharyngeal Nasopharyngeal Swab     Status: None   Collection Time: 03/20/19  4:28 AM   Specimen: Nasopharyngeal Swab  Result Value Ref Range Status   SARS Coronavirus 2 NEGATIVE NEGATIVE Final    Comment: (NOTE) SARS-CoV-2 target nucleic acids are NOT DETECTED. The SARS-CoV-2 RNA is generally detectable in upper and lower respiratory specimens during the acute phase of infection. Negative results do not preclude SARS-CoV-2 infection, do not rule out co-infections with other pathogens, and should not be used as the sole basis for treatment or other patient management decisions. Negative results must be combined with clinical observations, patient history, and epidemiological information. The expected result is Negative. Fact Sheet for Patients: SugarRoll.be Fact Sheet for Healthcare Providers: https://www.woods-mathews.com/ This test is not yet approved or cleared by the [place] FDA and  has been authorized for detection and/or diagnosis of SARS-CoV-2 by FDA under an Emergency Use Authorization (EUA). This EUA will remain  in effect (meaning this test can be used) for the duration of the COVID-19 declaration under Section 56 4(b)(1) of the Act, 21 U.S.C. section 360bbb-3(b)(1), unless the authorization is terminated or revoked sooner. Performed at  [facility], [place] [address], [place], [place] [address]   GI pathogen panel by PCR, stool     Status: None   Collection Time: 03/21/19 12:40 PM   Specimen: Stool  Result Value Ref Range Status   Plesiomonas shigelloides NOT DETECTED NOT DETECTED Final   Yersinia enterocolitica NOT DETECTED NOT DETECTED Final   Vibrio NOT DETECTED NOT DETECTED Final   Enteropathogenic E coli NOT DETECTED NOT DETECTED Final   E coli (ETEC) LT/ST NOT DETECTED NOT DETECTED Final   E coli [id number] by PCR Not applicable NOT DETECTED Final   Cryptosporidium by PCR NOT DETECTED NOT DETECTED Final   Entamoeba histolytica NOT DETECTED NOT DETECTED Final   Adenovirus F 40/41 NOT DETECTED NOT DETECTED Final   Norovirus GI/GII NOT DETECTED NOT DETECTED Final   Sapovirus NOT DETECTED NOT DETECTED Final    Comment: (NOTE) Performed At: [facility] [address] [place], [place] [id number] Rush Farmer MD [id number]    Vibrio  cholerae NOT DETECTED NOT DETECTED Final   Campylobacter by PCR NOT DETECTED NOT DETECTED Final   Salmonella by PCR NOT DETECTED NOT DETECTED Final   E coli (STEC) NOT DETECTED NOT DETECTED Final   Enteroaggregative E coli NOT DETECTED NOT DETECTED Final   Shigella by PCR NOT DETECTED NOT DETECTED Final   Cyclospora cayetanensis NOT DETECTED NOT DETECTED Final   Astrovirus NOT DETECTED NOT DETECTED Final   G lamblia by PCR NOT DETECTED NOT DETECTED Final   Rotavirus A by PCR NOT DETECTED NOT DETECTED Final  Giardia/Cryptosporidium EIA     Status: Abnormal   Collection Time: 03/21/19 12:40 PM   Specimen: Stool  Result Value Ref Range Status   Giardia Ag, Stl Positive (A) Negative Final   Cryptosporidium EIA Negative Negative Final    Comment: (NOTE) Performed At: [facility] [place], [place] [id number] Rush Farmer MD [id number]    Source of Sample STOOL  Final    Comment: Performed at [facility], [place] [address] [place], [place] [address]  C difficile quick scan w PCR reflex     Status: None   Collection Time: 03/21/19  8:31 PM   Specimen: STOOL  Result Value Ref Range Status   C Diff antigen NEGATIVE NEGATIVE Final   C Diff toxin NEGATIVE NEGATIVE Final   C Diff interpretation No C. difficile detected.  Final    Comment: Performed at [facility], [place] [address], [place], [place] [address]         Radiology Studies: Dg Chest Port 1 View  Result Date: 03/25/2019 CLINICAL DATA:  Nonproductive cough and shortness of breath 1-2 weeks. EXAM: PORTABLE CHEST 1 VIEW COMPARISON:  03/10/2017 FINDINGS: Stable elevation of the right hemidiaphragm. Mild bibasilar opacification likely atelectasis, although infection is possible. Small amount right pleural fluid. Cardiomediastinal silhouette and remainder of the exam is unchanged. IMPRESSION: Minimal bibasilar density likely atelectasis and less likely infection. Small amount right pleural fluid. Stable elevation right hemidiaphragm. Electronically Signed   By: Marin Olp M.D.   On: 03/25/2019 14:23        Scheduled Meds: . albuterol  2.5 mg Nebulization BID  . aspirin EC  81 mg Oral Daily  . atenolol  50 mg Oral BID  . busPIRone  5 mg Oral BID  . calcium-vitamin D  1 tablet Oral Daily  . cholecalciferol  1,000 Units Oral BID  . DULoxetine  60 mg Oral BID  . fesoterodine  4 mg Oral Daily  . heparin  5,000 Units Subcutaneous Q8H  . influenza vaccine adjuvanted  0.5 mL Intramuscular Tomorrow-1000  . latanoprost  1 drop Both Eyes QHS  . levothyroxine  175 mcg Oral Q0600  . lipase/protease/amylase  24,000 Units Oral TID WC  . loratadine  10 mg Oral Daily  . pantoprazole (PROTONIX) IV  40 mg Intravenous Q24H  . polyvinyl alcohol  1 drop Both Eyes BID  . pravastatin  40 mg Oral q1800  . pregabalin  300 mg Oral BID  . rOPINIRole  0.5 mg Oral QHS  . simethicone  80 mg Oral QID  . sodium chloride flush  3 mL Intravenous Once  .  triamcinolone ointment  1 application Topical BID   Continuous Infusions: . famotidine (PEPCID) IV 20 mg (03/26/19 2121)     LOS: 7 days     Cordelia Poche, MD [facility] 03/27/2019, 9:49 AM  If 7PM-7AM, please contact night-coverage [email or web address]

## 2019-03-27 NOTE — Progress Notes (Signed)
Physical Therapy Treatment Patient Details Name: [name] MRN: [id number] DOB: Dec 09, 1940 Today's Date: 03/27/2019    History of Present Illness Pt is 78 yo female admitted on 03/19/19 with colitis and falls.  She is s/p EGD and upper GI endoscopy.Stool study positive for Giardia. Pt was transferred to telemetry last night - with new rash.    PT Comments    Pt with a slight increase in confusion and impulsivity today.  Required increased cues for safety with gait and transfers.  Additionally, fatigued easily - reports she has had 5 BMs already this am.  Will progress as able next visit. Updated f/u recommendations to include supervision at home initially.   Follow Up Recommendations  Home health PT recommend supervision     Equipment Recommendations  Rolling walker with 5" wheels    Recommendations for Other Services       Precautions / Restrictions Precautions Precautions: Fall    Mobility  Bed Mobility Overal bed mobility: Needs Assistance Bed Mobility: Sit to Supine       Sit to supine: Min assist   General bed mobility comments: increase time  Transfers Overall transfer level: Needs assistance Equipment used: Rolling walker (2 wheeled) Transfers: Sit to/from Stand Sit to Stand: Min guard         General transfer comment: cues for safety; performed sit to stand x 2  Ambulation/Gait Ambulation/Gait assistance: Min assist Gait Distance (Feet): 100 Feet Assistive device: 4-wheeled walker       General Gait Details: needed constant cues to stay close to RW and to steer RW; cued for safety   Stairs             Wheelchair Mobility    Modified Rankin (Stroke Patients Only)       Balance Overall balance assessment: Needs assistance Sitting-balance support: No upper extremity supported;Feet unsupported Sitting balance-Leahy Scale: Good     Standing balance support: Bilateral upper extremity supported;During functional activity Standing  balance-Leahy Scale: Fair                              Cognition Arousal/Alertness: Awake/alert Behavior During Therapy: Impulsive Overall Cognitive Status: Impaired/Different from baseline Area of Impairment: Safety/judgement;Following commands                       Following Commands: Follows one step commands inconsistently Safety/Judgement: Decreased awareness of safety     General Comments: Pt more impulsive and confused today; needed increased cues for safety notified RN      Exercises      General Comments        Pertinent Vitals/Pain Pain Assessment: No/denies pain    Home Living                      Prior Function            PT Goals (current goals can now be found in the care plan section) Progress towards PT goals: Not progressing toward goals - comment(pt with increased confusion today -limiting progress)    Frequency    Min 2X/week      PT Plan Current plan remains appropriate    Co-evaluation              AM-PAC PT "6 Clicks" Mobility   Outcome Measure  Help needed turning from your back to your side while in a flat bed without using bedrails?:  None Help needed moving from lying on your back to sitting on the side of a flat bed without using bedrails?: A Little Help needed moving to and from a bed to a chair (including a wheelchair)?: A Little Help needed standing up from a chair using your arms (e.g., wheelchair or bedside chair)?: A Little Help needed to walk in hospital room?: A Little Help needed climbing 3-5 steps with a railing? : A Lot 6 Click Score: 18    End of Session Equipment Utilized During Treatment: Gait belt Activity Tolerance: Patient limited by fatigue Patient left: with call bell/phone within reach;in bed;with family/visitor present;with bed alarm set Nurse Communication: Mobility status(notified RN of increased confusion) PT Visit Diagnosis: Other abnormalities of gait and mobility  (R26.89);History of falling (Z91.81);Muscle weakness (generalized) (M62.81)     Time: [id number] PT Time Calculation (min) (ACUTE ONLY): 20 min  Charges:  $Gait Training: 8-22 mins                     Maggie Font, PT Acute Rehab Services [phone]    Karlton Lemon 03/27/2019, 2:01 PM

## 2019-03-27 NOTE — Progress Notes (Signed)
[name] Gastroenterology Progress Note  [name] 78 y.o. February 09, 1941  CC: Diarrhea  Subjective: Feeling much better today.  Rash has improved.  Diarrhea resolved.  Only one bowel movement today.  Continues to have substernal burning sensation.  ROS : Afebrile.  Negative for shortness of breath.  Complaining of weakness  Objective: Vital signs in last 24 hours: Vitals:   03/27/19 0444 03/27/19 0811  BP: (!) 143/65   Pulse: 67 64  Resp: 18 18  Temp: 98.7 F (37.1 C)   SpO2: 100% 100%    Physical Exam:  General.  Elderly appearing patient.  Not in acute distress Abdomen.  Soft, nontender, nondistended, bowel sounds present.  No peritoneal signs  Somewhat anxious. Neuro.  Alert/oriented x3  Lab Results: Recent Labs    03/26/19 0339 03/27/19 0434  NA 136 136  K 4.3 4.4  CL 105 102  CO2 23 25  GLUCOSE 108* 100*  BUN <5* 5*  CREATININE 0.76 0.88  CALCIUM 8.7* 8.4*  MG 1.8 1.8   Recent Labs    03/26/19 0339  AST 22  ALT 19  ALKPHOS 154*  BILITOT 0.5  PROT 5.6*  ALBUMIN 3.1*   Recent Labs    03/26/19 0339 03/27/19 0434  WBC 11.1* 6.5  NEUTROABS 10.2*  --   HGB 12.8 10.8*  HCT 39.9 33.6*  MCV 100.3* 98.8  PLT 139* 116*   No results for input(s): LABPROT, INR in the last 72 hours.    Assessment/Plan: -Diarrhea.  Stool study positive for Giardia.  Negative for C. difficile.  Status post treatment with Tinidazole.  -New onset rash.  Improving -   Flexible sigmoidoscopy on March 21, 2019 showed mild inflammation in the transverse colon otherwise no evidence of active colitis.  Biopsies were negative for any evidence of colitis.   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  -History of pancreatic cancer s/p Whipple surgery and radiation .  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 - GERD  Recommendations -------------------------- -Rest has improved.  Diarrhea has resolved but she continues to have epigastric pain. -Continue  IV PPI -Get CT abdomen pelvis with IV and oral contrast for ongoing epigastric abdominal pain -GI will follow  [name] MD, FACP 03/27/2019, 1:33 PM  Contact #  [phone]

## 2019-03-27 NOTE — Progress Notes (Signed)
Patient was able to maintain an O2 level of at least 92% or above on room air during my shift.

## 2019-03-28 LAB — BASIC METABOLIC PANEL
Anion gap: 8 (ref 5–15)
BUN: <5 mg/dL — ABNORMAL LOW (ref 8–23)
CO2: 28 mmol/L (ref 22–32)
Calcium: 8.2 mg/dL — ABNORMAL LOW (ref 8.9–10.3)
Chloride: 103 mmol/L (ref 98–111)
Creatinine, Ser: 1.02 mg/dL — ABNORMAL HIGH (ref 0.44–1.00)
GFR calc Af Amer: >60 mL/min (ref >60)
GFR calc non Af Amer: 52 mL/min — ABNORMAL LOW (ref >60)
Glucose, Bld: 106 mg/dL — ABNORMAL HIGH (ref 70–99)
Potassium: 3.5 mmol/L (ref 3.5–5.1)
Sodium: 139 mmol/L (ref 135–145)

## 2019-03-28 LAB — CBC
HCT: 36.8 % (ref 36.0–46.0)
Hemoglobin: 11.6 g/dL — ABNORMAL LOW (ref 12.0–15.0)
MCH: 32.4 pg (ref 26.0–34.0)
MCHC: 31.5 g/dL (ref 30.0–36.0)
MCV: 102.8 fL — ABNORMAL HIGH (ref 80.0–100.0)
Platelets: 140 10*3/uL — ABNORMAL LOW (ref 150–400)
RBC: 3.58 MIL/uL — ABNORMAL LOW (ref 3.87–5.11)
RDW: 16.5 % — ABNORMAL HIGH (ref 11.5–15.5)
WBC: 7.6 10*3/uL (ref 4.0–10.5)
nRBC: 0 % (ref 0.0–0.2)

## 2019-03-28 LAB — MAGNESIUM: Magnesium: 1.9 mg/dL (ref 1.7–2.4)

## 2019-03-28 MED ORDER — DULOXETINE HCL 60 MG PO CPEP
60.0000 mg | ORAL_CAPSULE | Freq: Every day | ORAL | Status: DC
  Administered 2019-03-29 – 2019-03-31 (×3): 60 mg via ORAL
  Filled 2019-03-28 (×3): qty 1

## 2019-03-28 MED ORDER — PREGABALIN 75 MG PO CAPS
150.0000 mg | ORAL_CAPSULE | Freq: Two times a day (BID) | ORAL | Status: DC
  Administered 2019-03-29 – 2019-03-31 (×5): 150 mg via ORAL
  Filled 2019-03-28 (×5): qty 2

## 2019-03-28 NOTE — Progress Notes (Signed)
PROGRESS NOTE    [name]  [id number] DOB: May 26, 1941 DOA: 03/19/2019 PCP: Briscoe Deutscher, DO   Brief Narrative: [name] is a 78 y.o. 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 Patient presented secondary to multiple falls and found to have evidence of colitis on imaging.   Assessment & Plan:   Principal Problem:   Acute colitis Active Problems:   Hypothyroidism, on Levothryoxine   Essential hypertension   GERD, on Prilosec   Osteoarthritis, mulitple sites   Carcinoma of head of pancreas ([place]), followed by Dr [name], Oncology   HLD (hyperlipidemia), on Lovastatin   Diarrhea   Fall at home, initial encounter   Hypokalemia   Acute renal failure (ARF) ([place])   Acute colitis Unclear etiology. Giardia antigen positive. GI consulted. EGD and sigmoidoscopy. C. Difficile negative. Overall some improvement but still with tenderness. Patient received tinidazole. CT abdomen pelvis on 10/28 is significant for further evidence of colitis with some improvement of other areas of colitis. -GI recommendations: repeat C. Difficile testing  Acute volume overload From initial fluid resuscitation requiring IV lasix. Appears euvolemic now.  Confusion Unsure of etiology but would be concerned initially for medication effect. Upon further review, patient is on significantly high dose of Cymbalta (home dosing) and Lyrica (high for patient's renal function. CrCl of around 45 mL/min). No evidence to suggest infection except for imaging findings of colon.  -Decrease to Cymbalta 30 mg daily -Decrease to Lyrica 150 mg BID  Rash Urticarial appearing. Scattered pattern. No desquamation. Has remained unchanged per patient but appears somewhat improved from previous  pictures.  Recurrent falls Dizziness Thought to be secondary to orthostasis from dehydration. PT recommending HHPT and RW.  Hypokalemia Hypomagnesemia Replete as needed.  AKI Creatinine of 1.54 on admission which improved with IV fluids.  Chest burning Sounds likely to be reflux related. No mention today -Continue Protonix  Essential hypertension Slightly uncontrolled -Continue to monitor  History of pancreatic cancer Patient is s/p Whipple procedure. Outpatient oncology management  Neuropathy -Continue Lyrica  Thrombocytopenia Chronic and stable.  Klebsiella UTI Completed antibiotics with Keflex   DVT prophylaxis: Heparin Code Status:   Code Status: Full Code Family Communication: None at bedside Disposition Plan: Discharge pending GI recommendations and improvement of confusion   Consultants:   [facility]  Procedures:   Sigmoidoscopy  EGD  Antimicrobials:  Tinidazole     Subjective: Patient is telling me a story of being in the cafeteria and asking some gentlemen to let her go.  Objective: Vitals:   03/28/19 0453 03/28/19 0748 03/28/19 0904 03/28/19 1310  BP: (!) 157/80 (!) 162/76  (!) 146/107  Pulse: 78 77  72  Resp: 18 18  19   Temp: 98.3 F (36.8 C) 98.5 F (36.9 C)  98.8 F (37.1 C)  TempSrc:  Oral  Oral  SpO2: 90% 96% 95% 92%  Weight:      Height:        Intake/Output Summary (Last 24 hours) at 03/28/2019 1334 Last data filed at 03/28/2019 0200 Gross per 24 hour  Intake 55.52 ml  Output 1300 ml  Net -1244.48 ml   Filed Weights   03/19/19 1522 03/20/19 0302  Weight: 65.8 kg 63.8 kg    Examination:  General exam: Appears calm and comfortable Respiratory system: Clear to auscultation. Respiratory effort normal. Cardiovascular system: S1 & S2 heard, RRR.  No murmurs, rubs, gallops or clicks. Gastrointestinal system: Abdomen is mildly distended, soft and nontender. No organomegaly or masses felt. Normal bowel sounds  heard. Central nervous system: Alert and disoriented. No focal neurological deficits. Extremities: No edema. No calf tenderness Skin: No cyanosis. No rashes Psychiatry: Judgement and insight appear impaired. Mood & affect appropriate.     Data Reviewed: I have personally reviewed following labs and imaging studies  CBC: Recent Labs  Lab 03/22/19 0311  03/24/19 0336 03/25/19 0321 03/26/19 0339 03/27/19 0434 03/28/19 0939  WBC 7.5   < > 5.7 7.8 11.1* 6.5 7.6  NEUTROABS 6.1  --   --   --  10.2*  --   --   HGB 9.7*   < > 10.6* 10.6* 12.8 10.8* 11.6*  HCT 30.5*   < > 34.0* 33.5* 39.9 33.6* 36.8  MCV 100.7*   < > 101.8* 101.2* 100.3* 98.8 102.8*  PLT 136*   < > 110* 119* 139* 116* 140*   < > = values in this interval not displayed.   Basic Metabolic Panel: Recent Labs  Lab 03/23/19 0358 03/24/19 0336 03/25/19 0321 03/26/19 0339 03/27/19 0434 03/28/19 0453  NA 141 139 139 136 136  --   K 3.9 4.8 5.1 4.3 4.4  --   CL 112* 111 112* 105 102  --   CO2 22 22 21* 23 25  --   GLUCOSE 115* 118* 132* 108* 100*  --   BUN <5* <5* <5* <5* 5*  --   CREATININE 0.72 0.67 0.72 0.76 0.88  --   CALCIUM 8.0* 8.2* 8.3* 8.7* 8.4*  --   MG 1.7 1.7 1.8 1.8 1.8 1.9   GFR: Estimated Creatinine Clearance: 44.4 mL/min (by C-G formula based on SCr of 0.88 mg/dL). Liver Function Tests: Recent Labs  Lab 03/22/19 0311 03/26/19 0339  AST 25 22  ALT 29 19  ALKPHOS 120 154*  BILITOT 0.3 0.5  PROT 5.3* 5.6*  ALBUMIN 2.9* 3.1*   No results for input(s): LIPASE, AMYLASE in the last 168 hours. No results for input(s): AMMONIA in the last 168 hours. Coagulation Profile: No results for input(s): INR, PROTIME in the last 168 hours. Cardiac Enzymes: No results for input(s): CKTOTAL, CKMB, CKMBINDEX, TROPONINI in the last 168 hours. BNP (last 3 results) No results for input(s): PROBNP in the last 8760 hours. HbA1C: No results for input(s): HGBA1C in the last 72 hours. CBG: No results for  input(s): GLUCAP in the last 168 hours. Lipid Profile: No results for input(s): CHOL, HDL, LDLCALC, TRIG, CHOLHDL, LDLDIRECT in the last 72 hours. Thyroid Function Tests: No results for input(s): TSH, T4TOTAL, FREET4, T3FREE, THYROIDAB in the last 72 hours. Anemia Panel: No results for input(s): VITAMINB12, FOLATE, FERRITIN, TIBC, IRON, RETICCTPCT in the last 72 hours. Sepsis Labs: No results for input(s): PROCALCITON, LATICACIDVEN in the last 168 hours.  Recent Results (from the past 240 hour(s))  Urine culture     Status: Abnormal   Collection Time: 03/19/19  7:37 PM   Specimen: Urine, Random  Result Value Ref Range Status   Specimen Description   Final    URINE, RANDOM Performed at [facility] [address], [place], [place] [address]    Special Requests   Final    NONE Performed at [facility], [place] [address] [place],  [address]    Culture >=100,000 COLONIES/mL KLEBSIELLA PNEUMONIAE (A)  Final   Report Status 03/21/2019 FINAL  Final  Organism ID, Bacteria KLEBSIELLA PNEUMONIAE (A)  Final      Susceptibility   Klebsiella pneumoniae - MIC*    AMPICILLIN RESISTANT Resistant     CEFAZOLIN <=4 SENSITIVE Sensitive     CEFTRIAXONE <=1 SENSITIVE Sensitive     CIPROFLOXACIN <=0.25 SENSITIVE Sensitive     GENTAMICIN <=1 SENSITIVE Sensitive     IMIPENEM <=0.25 SENSITIVE Sensitive     NITROFURANTOIN <=16 SENSITIVE Sensitive     TRIMETH/SULFA <=20 SENSITIVE Sensitive     AMPICILLIN/SULBACTAM 4 SENSITIVE Sensitive     PIP/TAZO 8 SENSITIVE Sensitive     Extended ESBL NEGATIVE Sensitive     * >=100,000 COLONIES/mL KLEBSIELLA PNEUMONIAE  SARS CORONAVIRUS 2 (TAT 6-24 HRS) Nasopharyngeal Nasopharyngeal Swab     Status: None   Collection Time: 03/20/19  4:28 AM   Specimen: Nasopharyngeal Swab  Result Value Ref Range Status   SARS Coronavirus 2 NEGATIVE NEGATIVE Final    Comment: (NOTE) SARS-CoV-2 target nucleic acids are NOT  DETECTED. The SARS-CoV-2 RNA is generally detectable in upper and lower respiratory specimens during the acute phase of infection. Negative results do not preclude SARS-CoV-2 infection, do not rule out co-infections with other pathogens, and should not be used as the sole basis for treatment or other patient management decisions. Negative results must be combined with clinical observations, patient history, and epidemiological information. The expected result is Negative. Fact Sheet for Patients: SugarRoll.be Fact Sheet for Healthcare Providers: https://www.woods-mathews.com/ This test is not yet approved or cleared by the [place] FDA and  has been authorized for detection and/or diagnosis of SARS-CoV-2 by FDA under an Emergency Use Authorization (EUA). This EUA will remain  in effect (meaning this test can be used) for the duration of the COVID-19 declaration under Section 56 4(b)(1) of the Act, 21 U.S.C. section 360bbb-3(b)(1), unless the authorization is terminated or revoked sooner. Performed at [facility], [place] [address] [place], [place] [address]   GI pathogen panel by PCR, stool     Status: None   Collection Time: 03/21/19 12:40 PM   Specimen: Stool  Result Value Ref Range Status   Plesiomonas shigelloides NOT DETECTED NOT DETECTED Final   Yersinia enterocolitica NOT DETECTED NOT DETECTED Final   Vibrio NOT DETECTED NOT DETECTED Final   Enteropathogenic E coli NOT DETECTED NOT DETECTED Final   E coli (ETEC) LT/ST NOT DETECTED NOT DETECTED Final   E coli [id number] by PCR Not applicable NOT DETECTED Final   Cryptosporidium by PCR NOT DETECTED NOT DETECTED Final   Entamoeba histolytica NOT DETECTED NOT DETECTED Final   Adenovirus F 40/41 NOT DETECTED NOT DETECTED Final   Norovirus GI/GII NOT DETECTED NOT DETECTED Final   Sapovirus NOT DETECTED NOT DETECTED Final    Comment: (NOTE) Performed At: [facility] [place], [place] [id number] Rush Farmer MD [id number]    Vibrio cholerae NOT DETECTED NOT DETECTED Final   Campylobacter by PCR NOT DETECTED NOT DETECTED Final   Salmonella by PCR NOT DETECTED NOT DETECTED Final   E coli (STEC) NOT DETECTED NOT DETECTED Final   Enteroaggregative E coli NOT DETECTED NOT DETECTED Final   Shigella by PCR NOT DETECTED NOT DETECTED Final   Cyclospora cayetanensis NOT DETECTED NOT DETECTED Final   Astrovirus NOT DETECTED NOT DETECTED Final   G lamblia by PCR NOT DETECTED NOT DETECTED Final   Rotavirus A by PCR NOT DETECTED NOT DETECTED Final  Giardia/Cryptosporidium EIA     Status: Abnormal  Collection Time: 03/21/19 12:40 PM   Specimen: Stool  Result Value Ref Range Status   Giardia Ag, Stl Positive (A) Negative Final   Cryptosporidium EIA Negative Negative Final    Comment: (NOTE) Performed At: [facility] [place], [place] [id number] Rush Farmer MD [id number]    Source of Sample STOOL  Final    Comment: Performed at [facility], [place] [address], [place], [place] [address]  C difficile quick scan w PCR reflex     Status: None   Collection Time: 03/21/19  8:31 PM   Specimen: STOOL  Result Value Ref Range Status   C Diff antigen NEGATIVE NEGATIVE Final   C Diff toxin NEGATIVE NEGATIVE Final   C Diff interpretation No C. difficile detected.  Final    Comment: Performed at [facility], [place] [address] [place], [place] [address]         Radiology Studies: Ct Abdomen Pelvis W Contrast  Result Date: 03/27/2019 CLINICAL DATA:  Epigastric abdominal pain. Diarrhea for the past week. History of Whipple procedure five years ago. Pancreatic head carcinoma. EXAM: CT ABDOMEN AND PELVIS WITH CONTRAST TECHNIQUE: Multidetector CT imaging of the abdomen and pelvis was performed using the standard protocol following bolus administration of intravenous contrast. CONTRAST:  178mL  OMNIPAQUE IOHEXOL 300 MG/ML SOLN, <See Chart> OMNIPAQUE IOHEXOL 300 MG/ML SOLN COMPARISON:  03/19/2019. FINDINGS: Lower chest: Right hemidiaphragm elevation with overlying right base atelectasis. Mild cardiomegaly with multivessel coronary artery atherosclerosis. Trace right pleural fluid, new since the prior CT. Hepatobiliary: Hepatic steatosis. Motion degradation involving the upper abdomen is mild. Sparing in the posterior aspect of segment 4. Pneumobilia. Cholecystectomy and choledochojejunostomy. No biliary duct dilatation. Pancreas: Status post Whipple procedure with an atrophic pancreatic body. Spleen: Normal in size, without focal abnormality. Adrenals/Urinary Tract: Normal adrenal glands. Normal kidneys, without hydronephrosis. Normal urinary bladder. Stomach/Bowel: Tiny hiatal hernia.  Status post gastrojejunostomy. New wall thickening and mucosal hyperenhancement involving the sigmoid and rectum, mild. The hepatic flexure colonic wall thickening has resolved. However, there is new or progressive ascending colonic wall thickening, including on 33/2. Normal terminal ileum and appendix. normal small bowel caliber. Vascular/Lymphatic: Aortic atherosclerosis. Again identified is soft tissue surrounding the SMA including on 29/2. This obliterates the SMV just caudal to the splenoportal confluence, including on 32/2. Otherwise, no abdominal adenopathy.  No pelvic sidewall adenopathy. Reproductive: Hysterectomy.  No adnexal mass. Other: Moderate pelvic floor laxity. No abdominal ascites or evidence of peritoneal metastasis. Multiple fat containing ventral abdominal wall hernias. The upper abdominal hernias are enlarged, including on 34/2 and 40/2. Musculoskeletal: Remote right pubic rami fractures. Osteopenia. Lumbosacral spine fixation. Chronic compression deformity at L2 is mild. There is also stable T10 compression deformity. IMPRESSION: 1. Multifocal colitis, including within the ascending and rectosigmoid  regions. Recommend clinical exclusion of C difficile. Inflammatory colitis could look similar. Not in a typical pattern of ischemia. 2. New small right pleural effusion. 3. Status post Whipple procedure for pancreatic carcinoma. Similar appearance of soft tissue surrounding the SMA and obliterating the SMV, suspicious for recurrent disease. 4. Hepatic steatosis. 5. Fat containing ventral abdominal wall hernias, including enlarging upper abdominal hernias. Electronically Signed   By: Abigail Miyamoto M.D.   On: 03/27/2019 20:00        Scheduled Meds:  albuterol  2.5 mg Nebulization BID   aspirin EC  81 mg Oral Daily   atenolol  50 mg Oral BID   busPIRone  5 mg Oral BID  calcium-vitamin D  1 tablet Oral Daily   cholecalciferol  1,000 Units Oral BID   [START ON 03/29/2019] DULoxetine  60 mg Oral Daily   fesoterodine  4 mg Oral Daily   heparin  5,000 Units Subcutaneous Q8H   influenza vaccine adjuvanted  0.5 mL Intramuscular Tomorrow-1000   latanoprost  1 drop Both Eyes QHS   levothyroxine  175 mcg Oral Q0600   lipase/protease/amylase  24,000 Units Oral TID WC   loratadine  10 mg Oral Daily   pantoprazole (PROTONIX) IV  40 mg Intravenous Q24H   polyvinyl alcohol  1 drop Both Eyes BID   pravastatin  40 mg Oral q1800   [START ON 03/29/2019] pregabalin  150 mg Oral BID   rOPINIRole  0.5 mg Oral QHS   simethicone  80 mg Oral QID   sodium chloride flush  3 mL Intravenous Once   triamcinolone ointment  1 application Topical BID   Continuous Infusions:  famotidine (PEPCID) IV 20 mg (03/28/19 1106)     LOS: 8 days     Cordelia Poche, MD [facility] 03/28/2019, 1:34 PM  If 7PM-7AM, please contact night-coverage [email or web address]

## 2019-03-28 NOTE — Care Management Important Message (Signed)
Important Message  Patient Details IM Letter given to Dessa Phi RN to present to the Patient Name: [name] MRN: [id number] Date of Birth: [birth year]   Medicare Important Message Given:  Yes     Kerin Salen 03/28/2019, 12:24 PM

## 2019-03-28 NOTE — Progress Notes (Signed)
[facility] Gastroenterology Progress Note  [name] 78 y.o. May 10, 1941  CC: Diarrhea  Subjective: Patient started having watery diarrhea again.  Had 3 bowel movements this morning.  Epigastric discomfort has improved.  Nausea and vomiting has improved.  Tolerating diet  ROS : Afebrile.  Negative for shortness of breath.  Complaining of weakness  Objective: Vital signs in last 24 hours: Vitals:   03/28/19 0748 03/28/19 0904  BP: (!) 162/76   Pulse: 77   Resp: 18   Temp: 98.5 F (36.9 C)   SpO2: 96% 95%    Physical Exam:  General.  Elderly appearing patient.  Not in acute distress Abdomen.  Soft, nontender, nondistended, bowel sounds present.  No peritoneal signs  Somewhat anxious. Neuro.  Alert/oriented x3  Lab Results: Recent Labs    03/26/19 0339 03/27/19 0434 03/28/19 0453  NA 136 136  --   K 4.3 4.4  --   CL 105 102  --   CO2 23 25  --   GLUCOSE 108* 100*  --   BUN <5* 5*  --   CREATININE 0.76 0.88  --   CALCIUM 8.7* 8.4*  --   MG 1.8 1.8 1.9   Recent Labs    03/26/19 0339  AST 22  ALT 19  ALKPHOS 154*  BILITOT 0.5  PROT 5.6*  ALBUMIN 3.1*   Recent Labs    03/26/19 0339 03/27/19 0434 03/28/19 0939  WBC 11.1* 6.5 7.6  NEUTROABS 10.2*  --   --   HGB 12.8 10.8* 11.6*  HCT 39.9 33.6* 36.8  MCV 100.3* 98.8 102.8*  PLT 139* 116* 140*   No results for input(s): LABPROT, INR in the last 72 hours.    Assessment/Plan: -Diarrhea.  Stool study positive for Giardia.  Negative for C. difficile.  Status post treatment with Tinidazole.  -New onset rash.  Improving -   Flexible sigmoidoscopy on March 21, 2019 showed mild inflammation in the transverse colon otherwise no evidence of active colitis.  Biopsies were negative for any evidence of colitis.   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  -History of pancreatic cancer s/p Whipple surgery and radiation .  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 - GERD  -Abnormal CT scan  revealing soft tissue surrounding the SMA and obliterating SMV which is similar to prior study but suspicious for recurrent disease.  Recommendations -------------------------- -Repeat CT scan showed resolution of thickening at hepatic flexure but shows new wall thickening of sigmoid colon, rectum and ascending colon.  Patient is experiencing more diarrhea today. -Check for C. difficile.  If negative, retry Colestid -Continue IV PPI for now. -Recommended repeat CT abdomen pelvis with IV and oral contrast in 4 weeks for follow-up on soft tissue lesion around SMA and SMV.   -Findings and treatment options were discussed with the patient as well as patient's son at bedside.  -GI will follow  [name] MD, [place] 03/28/2019, 12:40 PM  Contact #  [phone]

## 2019-03-29 LAB — URINALYSIS, ROUTINE W REFLEX MICROSCOPIC
Bacteria, UA: NONE SEEN
Bilirubin Urine: NEGATIVE
Glucose, UA: NEGATIVE mg/dL
Ketones, ur: NEGATIVE mg/dL
Leukocytes,Ua: NEGATIVE
Nitrite: NEGATIVE
Protein, ur: NEGATIVE mg/dL
Specific Gravity, Urine: 1.005 (ref 1.005–1.030)
pH: 7 (ref 5.0–8.0)

## 2019-03-29 LAB — MAGNESIUM: Magnesium: 2.1 mg/dL (ref 1.7–2.4)

## 2019-03-29 NOTE — TOC Progression Note (Signed)
Transition of Care [facility]) - Progression Note    Patient Details  Name: [name] MRN: [id number] Date of Birth: 10-Sep-1940  Transition of [facility]) CM/SW Contact  Malley Hauter, Juliann Pulse, RN Phone Number: 03/29/2019, 3:10 PM  Clinical Narrative:  Patient has rw. Jay following for HHRN/PT-has orders. No further CM needs.     Expected Discharge Plan: [place] Barriers to Discharge: Continued Medical Work up  Expected Discharge Plan and Services Expected Discharge Plan: [place]   Discharge Planning Services: CM Consult Post Acute Care Choice: Durable Medical Equipment, Home Health Living arrangements for the past 2 months: Single Family Home                 DME Arranged: [place] rolling DME Agency: [employer] Date DME Agency Contacted: 03/22/19 Time DME Agency Contacted: [phone] Representative spoke with at DME Agency: Thedore Mins [place]: RN, PT [place] Agency: [place] ([place]) Date [place]: 03/22/19 Time [place]: 1446 Representative spoke with at [place]: [place] ([place]) Interventions    Readmission Risk Interventions No flowsheet data found.

## 2019-03-29 NOTE — Progress Notes (Signed)
PROGRESS NOTE    [name]  [id number] DOB: Nov 02, 1940 DOA: 03/19/2019 PCP: Briscoe Deutscher, DO   Brief Narrative: [name] is a 78 y.o. 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 Patient presented secondary to multiple falls and found to have evidence of colitis on imaging.   Assessment & Plan:   Principal Problem:   Acute colitis Active Problems:   Hypothyroidism, on Levothryoxine   Essential hypertension   GERD, on Prilosec   Osteoarthritis, mulitple sites   Carcinoma of head of pancreas ([place]), followed by Dr [name], Oncology   HLD (hyperlipidemia), on Lovastatin   Diarrhea   Fall at home, initial encounter   Hypokalemia   Acute renal failure (ARF) ([place])   Acute colitis Unclear etiology. Giardia antigen positive. GI consulted. EGD and sigmoidoscopy. C. Difficile negative. Overall some improvement but still with tenderness. Patient received tinidazole. CT abdomen pelvis on 10/28 is significant for further evidence of colitis with some improvement of other areas of colitis. -GI recommendations: repeat C. Difficile testing pending  Acute volume overload From initial fluid resuscitation requiring IV lasix. Appears euvolemic now.  Confusion Unsure of etiology but would be concerned initially for medication effect. Upon further review, patient is on significantly high dose of Cymbalta (home dosing) and Lyrica (high for patient's renal function. CrCl of around 45 mL/min). No evidence to suggest infection except for imaging findings of colon. Per discussion with son, patient has a history of mild dementia and has shown signs of delirium in the past -Continue decreased dose of Cymbalta and Lyrica for now -Redirection -Patient with possible urinary retention, so  will evaluate for recurrent/persistent UTI  Rash Urticarial appearing. Scattered pattern. No desquamation. Has remained unchanged per patient but appears somewhat improved from previous pictures.  Recurrent falls Dizziness Thought to be secondary to orthostasis from dehydration. PT recommending HHPT and RW.  Hypokalemia Hypomagnesemia Replete as needed.  AKI Creatinine of 1.54 on admission which improved with IV fluids.  Chest burning Sounds likely to be reflux related. No mention today -Continue Protonix  Essential hypertension Slightly uncontrolled -Continue to monitor  History of pancreatic cancer Patient is s/p Whipple procedure. Outpatient oncology management  Neuropathy -Continue Lyrica (dose adjusted)  Thrombocytopenia Chronic and stable.  Klebsiella UTI Completed antibiotics with Keflex. Showing symptoms of recurrent/persistent UTI -Repeat urinalysis and culture   DVT prophylaxis: Heparin Code Status:   Code Status: Full Code Family Communication: Son at bedside Disposition Plan: Discharge pending GI recommendations and improvement of confusion   Consultants:   [facility]  Procedures:   Sigmoidoscopy  EGD  Antimicrobials:  Tinidazole     Subjective: Some abdominal fullness. Reports having the urge to urinary but then not being able to urinate much when she is on the toilet.   Objective: Vitals:   03/29/19 0509 03/29/19 0643 03/29/19 0725 03/29/19 1242  BP: (!) 172/91 (!) 192/88  (!) 156/81  Pulse: 75   71  Resp: 16   (!) 22  Temp: 98.7 F (37.1 C)   97.7 F (36.5 C)  TempSrc: Oral   Oral  SpO2: 97%  99% 99%  Weight:      Height:        Intake/Output Summary (Last 24 hours) at 03/29/2019 1629 Last data filed at 03/29/2019 0930 Gross per 24 hour  Intake 600 ml  Output --  Net 600 ml  Filed Weights   03/19/19 1522 03/20/19 0302  Weight: 65.8 kg 63.8 kg    Examination:  General exam: Appears calm and  comfortable Respiratory system: Clear to auscultation. Respiratory effort normal. Cardiovascular system: S1 & S2 heard, RRR. No murmurs, rubs, gallops or clicks. Gastrointestinal system: Abdomen is sightly distended, soft and nontender. No organomegaly or masses felt. Normal bowel sounds heard. Central nervous system: Alert and oriented to person and place. No focal neurological deficits. Extremities: 2+ LE edema. No calf tenderness Skin: No cyanosis. No rashes Psychiatry: Judgement and insight appear impaired. Mood & affect appropriate.     Data Reviewed: I have personally reviewed following labs and imaging studies  CBC: Recent Labs  Lab 03/24/19 0336 03/25/19 0321 03/26/19 0339 03/27/19 0434 03/28/19 0939  WBC 5.7 7.8 11.1* 6.5 7.6  NEUTROABS  --   --  10.2*  --   --   HGB 10.6* 10.6* 12.8 10.8* 11.6*  HCT 34.0* 33.5* 39.9 33.6* 36.8  MCV 101.8* 101.2* 100.3* 98.8 102.8*  PLT 110* 119* 139* 116* [id number]*   Basic Metabolic Panel: Recent Labs  Lab 03/24/19 0336 03/25/19 0321 03/26/19 0339 03/27/19 0434 03/28/19 0453 03/28/19 1344 03/29/19 0447  NA 139 139 136 136  --  139  --   K 4.8 5.1 4.3 4.4  --  3.5  --   CL 111 112* 105 102  --  103  --   CO2 22 21* 23 25  --  28  --   GLUCOSE 118* 132* 108* 100*  --  106*  --   BUN <5* <5* <5* 5*  --  <5*  --   CREATININE 0.67 0.72 0.76 0.88  --  1.02*  --   CALCIUM 8.2* 8.3* 8.7* 8.4*  --  8.2*  --   MG 1.7 1.8 1.8 1.8 1.9  --  2.1   GFR: Estimated Creatinine Clearance: 38.3 mL/min (A) (by C-G formula based on SCr of 1.02 mg/dL (H)). Liver Function Tests: Recent Labs  Lab 03/26/19 0339  AST 22  ALT 19  ALKPHOS 154*  BILITOT 0.5  PROT 5.6*  ALBUMIN 3.1*   No results for input(s): LIPASE, AMYLASE in the last 168 hours. No results for input(s): AMMONIA in the last 168 hours. Coagulation Profile: No results for input(s): INR, PROTIME in the last 168 hours. Cardiac Enzymes: No results for input(s): CKTOTAL, CKMB,  CKMBINDEX, TROPONINI in the last 168 hours. BNP (last 3 results) No results for input(s): PROBNP in the last 8760 hours. HbA1C: No results for input(s): HGBA1C in the last 72 hours. CBG: No results for input(s): GLUCAP in the last 168 hours. Lipid Profile: No results for input(s): CHOL, HDL, LDLCALC, TRIG, CHOLHDL, LDLDIRECT in the last 72 hours. Thyroid Function Tests: No results for input(s): TSH, T4TOTAL, FREET4, T3FREE, THYROIDAB in the last 72 hours. Anemia Panel: No results for input(s): VITAMINB12, FOLATE, FERRITIN, TIBC, IRON, RETICCTPCT in the last 72 hours. Sepsis Labs: No results for input(s): PROCALCITON, LATICACIDVEN in the last 168 hours.  Recent Results (from the past 240 hour(s))  Urine culture     Status: Abnormal   Collection Time: 03/19/19  7:37 PM   Specimen: Urine, Random  Result Value Ref Range Status   Specimen Description   Final    URINE, RANDOM Performed at [facility] [address] [place], [place] [address]    Special Requests   Final    NONE Performed at [facility], [address] [place], [place] [address]    Culture >=100,000 COLONIES/mL KLEBSIELLA PNEUMONIAE (A)  Final   Report Status 03/21/2019 FINAL  Final   Organism ID, Bacteria KLEBSIELLA PNEUMONIAE (A)  Final      Susceptibility   Klebsiella pneumoniae - MIC*    AMPICILLIN RESISTANT Resistant     CEFAZOLIN <=4 SENSITIVE Sensitive     CEFTRIAXONE <=1 SENSITIVE Sensitive     CIPROFLOXACIN <=0.25 SENSITIVE Sensitive     GENTAMICIN <=1 SENSITIVE Sensitive     IMIPENEM <=0.25 SENSITIVE Sensitive     NITROFURANTOIN <=16 SENSITIVE Sensitive     TRIMETH/SULFA <=20 SENSITIVE Sensitive     AMPICILLIN/SULBACTAM 4 SENSITIVE Sensitive     PIP/TAZO 8 SENSITIVE Sensitive     Extended ESBL NEGATIVE Sensitive     * >=100,000 COLONIES/mL KLEBSIELLA PNEUMONIAE  SARS CORONAVIRUS 2 (TAT 6-24 HRS) Nasopharyngeal Nasopharyngeal Swab     Status: None   Collection  Time: 03/20/19  4:28 AM   Specimen: Nasopharyngeal Swab  Result Value Ref Range Status   SARS Coronavirus 2 NEGATIVE NEGATIVE Final    Comment: (NOTE) SARS-CoV-2 target nucleic acids are NOT DETECTED. The SARS-CoV-2 RNA is generally detectable in upper and lower respiratory specimens during the acute phase of infection. Negative results do not preclude SARS-CoV-2 infection, do not rule out co-infections with other pathogens, and should not be used as the sole basis for treatment or other patient management decisions. Negative results must be combined with clinical observations, patient history, and epidemiological information. The expected result is Negative. Fact Sheet for Patients: SugarRoll.be Fact Sheet for Healthcare Providers: https://www.woods-mathews.com/ This test is not yet approved or cleared by the [place] FDA and  has been authorized for detection and/or diagnosis of SARS-CoV-2 by FDA under an Emergency Use Authorization (EUA). This EUA will remain  in effect (meaning this test can be used) for the duration of the COVID-19 declaration under Section 56 4(b)(1) of the Act, 21 U.S.C. section 360bbb-3(b)(1), unless the authorization is terminated or revoked sooner. Performed at [facility], [place] [address], [place], [place] [address]   GI pathogen panel by PCR, stool     Status: None   Collection Time: 03/21/19 12:40 PM   Specimen: Stool  Result Value Ref Range Status   Plesiomonas shigelloides NOT DETECTED NOT DETECTED Final   Yersinia enterocolitica NOT DETECTED NOT DETECTED Final   Vibrio NOT DETECTED NOT DETECTED Final   Enteropathogenic E coli NOT DETECTED NOT DETECTED Final   E coli (ETEC) LT/ST NOT DETECTED NOT DETECTED Final   E coli [id number] by PCR Not applicable NOT DETECTED Final   Cryptosporidium by PCR NOT DETECTED NOT DETECTED Final   Entamoeba histolytica NOT DETECTED NOT DETECTED Final   Adenovirus F  40/41 NOT DETECTED NOT DETECTED Final   Norovirus GI/GII NOT DETECTED NOT DETECTED Final   Sapovirus NOT DETECTED NOT DETECTED Final    Comment: (NOTE) Performed At: [facility] [place], [place] [id number] Rush Farmer MD [id number]    Vibrio cholerae NOT DETECTED NOT DETECTED Final   Campylobacter by PCR NOT DETECTED NOT DETECTED Final   Salmonella by PCR NOT DETECTED NOT DETECTED Final   E coli (STEC) NOT DETECTED NOT DETECTED Final   Enteroaggregative E coli NOT DETECTED NOT DETECTED Final   Shigella by PCR NOT DETECTED NOT DETECTED Final   Cyclospora cayetanensis NOT DETECTED NOT DETECTED Final   Astrovirus NOT DETECTED NOT DETECTED Final   G lamblia by PCR NOT DETECTED  NOT DETECTED Final   Rotavirus A by PCR NOT DETECTED NOT DETECTED Final  Giardia/Cryptosporidium EIA     Status: Abnormal   Collection Time: 03/21/19 12:40 PM   Specimen: Stool  Result Value Ref Range Status   Giardia Ag, Stl Positive (A) Negative Final   Cryptosporidium EIA Negative Negative Final    Comment: (NOTE) Performed At: [facility] [place], [place] [id number] Rush Farmer MD [id number]    Source of Sample STOOL  Final    Comment: Performed at [facility], [place] [address] [place], [place] [address]  C difficile quick scan w PCR reflex     Status: None   Collection Time: 03/21/19  8:31 PM   Specimen: STOOL  Result Value Ref Range Status   C Diff antigen NEGATIVE NEGATIVE Final   C Diff toxin NEGATIVE NEGATIVE Final   C Diff interpretation No C. difficile detected.  Final    Comment: Performed at [facility], [place] [address], [place], [place] [address]         Radiology Studies: Ct Abdomen Pelvis W Contrast  Result Date: 03/27/2019 CLINICAL DATA:  Epigastric abdominal pain. Diarrhea for the past week. History of Whipple procedure five years ago. Pancreatic head carcinoma. EXAM: CT ABDOMEN AND  PELVIS WITH CONTRAST TECHNIQUE: Multidetector CT imaging of the abdomen and pelvis was performed using the standard protocol following bolus administration of intravenous contrast. CONTRAST:  146mL OMNIPAQUE IOHEXOL 300 MG/ML SOLN, <See Chart> OMNIPAQUE IOHEXOL 300 MG/ML SOLN COMPARISON:  03/19/2019. FINDINGS: Lower chest: Right hemidiaphragm elevation with overlying right base atelectasis. Mild cardiomegaly with multivessel coronary artery atherosclerosis. Trace right pleural fluid, new since the prior CT. Hepatobiliary: Hepatic steatosis. Motion degradation involving the upper abdomen is mild. Sparing in the posterior aspect of segment 4. Pneumobilia. Cholecystectomy and choledochojejunostomy. No biliary duct dilatation. Pancreas: Status post Whipple procedure with an atrophic pancreatic body. Spleen: Normal in size, without focal abnormality. Adrenals/Urinary Tract: Normal adrenal glands. Normal kidneys, without hydronephrosis. Normal urinary bladder. Stomach/Bowel: Tiny hiatal hernia.  Status post gastrojejunostomy. New wall thickening and mucosal hyperenhancement involving the sigmoid and rectum, mild. The hepatic flexure colonic wall thickening has resolved. However, there is new or progressive ascending colonic wall thickening, including on 33/2. Normal terminal ileum and appendix. normal small bowel caliber. Vascular/Lymphatic: Aortic atherosclerosis. Again identified is soft tissue surrounding the SMA including on 29/2. This obliterates the SMV just caudal to the splenoportal confluence, including on 32/2. Otherwise, no abdominal adenopathy.  No pelvic sidewall adenopathy. Reproductive: Hysterectomy.  No adnexal mass. Other: Moderate pelvic floor laxity. No abdominal ascites or evidence of peritoneal metastasis. Multiple fat containing ventral abdominal wall hernias. The upper abdominal hernias are enlarged, including on 34/2 and 40/2. Musculoskeletal: Remote right pubic rami fractures. Osteopenia.  Lumbosacral spine fixation. Chronic compression deformity at L2 is mild. There is also stable T10 compression deformity. IMPRESSION: 1. Multifocal colitis, including within the ascending and rectosigmoid regions. Recommend clinical exclusion of C difficile. Inflammatory colitis could look similar. Not in a typical pattern of ischemia. 2. New small right pleural effusion. 3. Status post Whipple procedure for pancreatic carcinoma. Similar appearance of soft tissue surrounding the SMA and obliterating the SMV, suspicious for recurrent disease. 4. Hepatic steatosis. 5. Fat containing ventral abdominal wall hernias, including enlarging upper abdominal hernias. Electronically Signed   By: Abigail Miyamoto M.D.   On: 03/27/2019 20:00        Scheduled Meds:  albuterol  2.5 mg Nebulization BID  aspirin EC  81 mg Oral Daily   atenolol  50 mg Oral BID   busPIRone  5 mg Oral BID   calcium-vitamin D  1 tablet Oral Daily   cholecalciferol  1,000 Units Oral BID   DULoxetine  60 mg Oral Daily   fesoterodine  4 mg Oral Daily   heparin  5,000 Units Subcutaneous Q8H   influenza vaccine adjuvanted  0.5 mL Intramuscular Tomorrow-1000   latanoprost  1 drop Both Eyes QHS   levothyroxine  175 mcg Oral Q0600   lipase/protease/amylase  24,000 Units Oral TID WC   loratadine  10 mg Oral Daily   pantoprazole (PROTONIX) IV  40 mg Intravenous Q24H   polyvinyl alcohol  1 drop Both Eyes BID   pravastatin  40 mg Oral q1800   pregabalin  150 mg Oral BID   rOPINIRole  0.5 mg Oral QHS   simethicone  80 mg Oral QID   sodium chloride flush  3 mL Intravenous Once   triamcinolone ointment  1 application Topical BID   Continuous Infusions:  famotidine (PEPCID) IV 20 mg (03/29/19 1010)     LOS: 9 days     Cordelia Poche, MD [facility] 03/29/2019, 4:29 PM  If 7PM-7AM, please contact night-coverage [email or web address]

## 2019-03-29 NOTE — Progress Notes (Signed)
[facility] Gastroenterology Progress Note  [name] 78 y.o. Jul 17, 1940  CC: Diarrhea  Subjective: Diarrhea has improved with Imodium.  Had only one bowel movement today.  Stool sample for C. difficile has not been collected.  Abdominal pain has improved.  Tolerating diet  ROS : Afebrile.  Negative for shortness of breath.  Complaining of weakness  Objective: Vital signs in last 24 hours: Vitals:   03/29/19 0643 03/29/19 0725  BP: (!) 192/88   Pulse:    Resp:    Temp:    SpO2:  99%    Physical Exam:  General.  Elderly appearing patient.  Not in acute distress Abdomen.  Soft, nontender, nondistended, bowel sounds present.  No peritoneal signs  Somewhat anxious. Neuro.  Alert/oriented x3  Lab Results: Recent Labs    03/27/19 0434 03/28/19 0453 03/28/19 1344 03/29/19 0447  NA 136  --  139  --   K 4.4  --  3.5  --   CL 102  --  103  --   CO2 25  --  28  --   GLUCOSE 100*  --  106*  --   BUN 5*  --  <5*  --   CREATININE 0.88  --  1.02*  --   CALCIUM 8.4*  --  8.2*  --   MG 1.8 1.9  --  2.1   No results for input(s): AST, ALT, ALKPHOS, BILITOT, PROT, ALBUMIN in the last 72 hours. Recent Labs    03/27/19 0434 03/28/19 0939  WBC 6.5 7.6  HGB 10.8* 11.6*  HCT 33.6* 36.8  MCV 98.8 102.8*  PLT 116* 140*   No results for input(s): LABPROT, INR in the last 72 hours.    Assessment/Plan: -Diarrhea.  Stool study positive for Giardia.  Negative for C. difficile.  Status post treatment with Tinidazole.  -New onset rash.  Improving -   Flexible sigmoidoscopy on March 21, 2019 showed mild inflammation in the transverse colon otherwise no evidence of active colitis.  Biopsies were negative for any evidence of colitis.   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  -History of pancreatic cancer s/p Whipple surgery and radiation .  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 - GERD  -Abnormal CT scan revealing soft tissue surrounding the SMA and  obliterating SMV which is similar to prior study but suspicious for recurrent disease.  Recommendations -------------------------- -Repeat CT scan showed resolution of thickening at hepatic flexure but shows new wall thickening of sigmoid colon, rectum and ascending colon.  -Diarrhea has improved but still recommend to check for C. difficile given repeat CT scan finding.  - If Diarrhea persist and C. difficile negative, then consider to retry Colestid  -Continue IV PPI for now.  -Recommended repeat CT abdomen pelvis with IV and oral contrast in 4 weeks for follow-up on soft tissue lesion around SMA and SMV.     -GI will follow  [name] MD, [place] 03/29/2019, 9:35 AM  Contact #  [phone]

## 2019-03-30 LAB — URINE CULTURE: Culture: <10000 — AB

## 2019-03-30 LAB — BASIC METABOLIC PANEL
Anion gap: 13 (ref 5–15)
BUN: <5 mg/dL — ABNORMAL LOW (ref 8–23)
CO2: 24 mmol/L (ref 22–32)
Calcium: 8.5 mg/dL — ABNORMAL LOW (ref 8.9–10.3)
Chloride: 104 mmol/L (ref 98–111)
Creatinine, Ser: 0.83 mg/dL (ref 0.44–1.00)
GFR calc Af Amer: >60 mL/min (ref >60)
GFR calc non Af Amer: >60 mL/min (ref >60)
Glucose, Bld: 88 mg/dL (ref 70–99)
Potassium: 3.2 mmol/L — ABNORMAL LOW (ref 3.5–5.1)
Sodium: 141 mmol/L (ref 135–145)

## 2019-03-30 LAB — MAGNESIUM: Magnesium: 1.9 mg/dL (ref 1.7–2.4)

## 2019-03-30 MED ORDER — LABETALOL HCL 5 MG/ML IV SOLN
10.0000 mg | Freq: Once | INTRAVENOUS | Status: AC
  Administered 2019-03-30: 10 mg via INTRAVENOUS
  Filled 2019-03-30 (×4): qty 4

## 2019-03-30 NOTE — Progress Notes (Signed)
   03/30/19 0533  Vitals  BP (!) 164/78   Rn administered IV Hydralazine 5mg  per order. RN Will continue to monitor.

## 2019-03-30 NOTE — Progress Notes (Signed)
[facility] PROGRESS NOTE Subjective Patient states that the diarrhea is doing better.  She feels better is having no abdominal pain.  C. difficile quick scan was negative a week ago, another test was drawn and is pending.  Objective: Vital signs in last 24 hours: Temp:  [97.7 F (36.5 C)-98 F (36.7 C)] 97.8 F (36.6 C) (10/31 0533) Pulse Rate:  [68-77] 68 (10/31 0533) Resp:  [18-22] 20 (10/31 0533) BP: (143-187)/(69-90) 164/78 (10/31 0533) SpO2:  [94 %-99 %] 96 % (10/31 0753) FiO2 (%):  [22 %] 22 % (10/30 1242) Last BM Date: 03/29/19  Intake/Output from previous day: 10/30 0701 - 10/31 0700 In: 1400 [P.O.:1250; IV Piggyback:150] Out: 2125 [Urine:2125] Intake/Output this shift: No intake/output data recorded.  PE: General--no distress.  Somewhat anxious  Abdomen--soft and nontender  Lab Results: Recent Labs    03/28/19 0939  WBC 7.6  HGB 11.6*  HCT 36.8  PLT 140*   BMET Recent Labs    03/28/19 1344 03/30/19 0517  NA 139 141  K 3.5 3.2*  CL 103 104  CO2 28 24  CREATININE 1.02* 0.83   LFT No results for input(s): PROT, AST, ALT, ALKPHOS, BILITOT, BILIDIR, IBILI in the last 72 hours. PT/INR No results for input(s): LABPROT, INR in the last 72 hours. PANCREAS No results for input(s): LIPASE in the last 72 hours.       Studies/Results: No results found.  Medications: I have reviewed the patient's current medications.  Assessment:   1.  Diarrhea.  Was treated with tinidazole and does seem to be better.  Biopsies on sigmoid were negative for any kind of colitis.  CT suggests some intermittent swelling of the colon of unclear significance.  She has soft tissue surrounding the SMA and SMV suspicious for recurrent disease that is fairly stable.  This could be causing some transient ischemia   Plan: If patient is doing okay otherwise I think it would be reasonable for discharge.  Would suggest that she follow-up with Dr. Alessandra Bevels in about 3 to  4 weeks to arrange a repeat CT scan.  Would also suggest that she follow-up with Dr. [name].   Nancy Fetter 03/30/2019, 11:44 AM  This note was created using voice recognition software. Minor errors may Have occurred unintentionally.  Pager: [phone] If no answer or after hours call [phone]

## 2019-03-30 NOTE — Progress Notes (Addendum)
   03/29/19 2151  MEWS Score  BP (!) 187/87   RN administered 5mg  Hydralazine IV per order. RN will continue to monitor.

## 2019-03-30 NOTE — Progress Notes (Signed)
   03/30/19 0020  MEWS Score  BP (!) 171/90   On call NP notified of BP per order. This is the Pt's BP after she received 5mg  Hydralazine per order. RN will continue to monitor.

## 2019-03-30 NOTE — Progress Notes (Signed)
   03/30/19 0210  Vitals  BP (!) 170/83   RN administered Labetalol 10mg  per order from on call NP.

## 2019-03-30 NOTE — Plan of Care (Signed)
  Problem: Clinical Measurements: Goal: Respiratory complications will improve Outcome: Progressing Goal: Cardiovascular complication will be avoided Outcome: Progressing   Problem: Activity: Goal: Risk for activity intolerance will decrease Outcome: Progressing   Problem: Elimination: Goal: Will not experience complications related to bowel motility Outcome: Progressing Goal: Will not experience complications related to urinary retention Outcome: Progressing   Problem: Safety: Goal: Ability to remain free from injury will improve Outcome: Progressing

## 2019-03-30 NOTE — Progress Notes (Signed)
PROGRESS NOTE    [name]  [id number] DOB: January 31, 1941 DOA: 03/19/2019 PCP: Briscoe Deutscher, DO   Brief Narrative: [name] is a 78 y.o. 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 Patient presented secondary to multiple falls and found to have evidence of colitis on imaging.   Assessment & Plan:   Principal Problem:   Acute colitis Active Problems:   Hypothyroidism, on Levothryoxine   Essential hypertension   GERD, on Prilosec   Osteoarthritis, mulitple sites   Carcinoma of head of pancreas ([place]), followed by Dr [name], Oncology   HLD (hyperlipidemia), on Lovastatin   Diarrhea   Fall at home, initial encounter   Hypokalemia   Acute renal failure (ARF) ([place])   Acute colitis Unclear etiology. Giardia antigen positive. GI consulted. EGD and sigmoidoscopy. C. Difficile negative. Overall some improvement but still with tenderness. Patient received tinidazole. CT abdomen pelvis on 10/28 is significant for further evidence of colitis with some improvement of other areas of colitis. -GI recommendations: Okay to discharge with follow-up  Acute volume overload From initial fluid resuscitation requiring IV lasix.  Confusion Unsure of etiology but would be concerned initially for medication effect. Upon further review, patient is on significantly high dose of Cymbalta (home dosing) and Lyrica (high for patient's renal function. CrCl of around 45 mL/min). No evidence to suggest infection except for imaging findings of colon. Per discussion with son, patient has a history of mild dementia and has shown signs of delirium in the past -Continue decreased dose of Cymbalta and Lyrica  Rash Urticarial appearing. Scattered pattern. No desquamation. Has remained unchanged per  patient but appears somewhat improved from previous pictures.  Recurrent falls Dizziness Thought to be secondary to orthostasis from dehydration. PT recommending HHPT and RW.  Hypokalemia Hypomagnesemia Replete as needed.  AKI Creatinine of 1.54 on admission which improved with IV fluids.  Chest burning Sounds likely to be reflux related. Resolved. -Continue Protonix  Essential hypertension Slightly uncontrolled -Continue to monitor  History of pancreatic cancer Patient is s/p Whipple procedure. Outpatient oncology management  Neuropathy -Continue Lyrica (dose adjusted)  Thrombocytopenia Chronic and stable.  Klebsiella UTI Completed antibiotics with Keflex. Showing symptoms of recurrent/persistent UTI. Repeat culture pending.   DVT prophylaxis: Heparin Code Status:   Code Status: Full Code Family Communication: Son at telephone Disposition Plan: Discharge pending GI recommendations and improvement of confusion   Consultants:   [facility]  Procedures:   Sigmoidoscopy  EGD  Antimicrobials:  Tinidazole     Subjective: Some abdominal fullness. Reports having the urge to urinary but then not being able to urinate much when she is on the toilet.   Objective: Vitals:   03/30/19 0210 03/30/19 0229 03/30/19 0533 03/30/19 0753  BP: (!) 170/83 (!) 143/69 (!) 164/78   Pulse: 76 74 68   Resp:   20   Temp:   97.8 F (36.6 C)   TempSrc:   Oral   SpO2:  94% 97% 96%  Weight:      Height:        Intake/Output Summary (Last 24 hours) at 03/30/2019 1342 Last data filed at 03/30/2019 0600 Gross per 24 hour  Intake 1040.04 ml  Output 2125 ml  Net -1084.96 ml   Filed Weights   03/19/19 1522 03/20/19 0302  Weight: 65.8 kg 63.8 kg    Examination:  General exam:  Appears calm and comfortable Respiratory system: Clear to auscultation. Respiratory effort normal. Cardiovascular system: S1 & S2 heard, RRR. No murmurs, rubs, gallops or clicks. Gastrointestinal  system: Abdomen is nondistended, soft and nontender. No organomegaly or masses felt. Normal bowel sounds heard. Central nervous system: Alert and oriented to person, place and month/year. No focal neurological deficits. Extremities: No calf tenderness Skin: No cyanosis. No rashes Psychiatry: Judgement and insight appear normal. Mood & affect appropriate.    Data Reviewed: I have personally reviewed following labs and imaging studies  CBC: Recent Labs  Lab 03/24/19 0336 03/25/19 0321 03/26/19 0339 03/27/19 0434 03/28/19 0939  WBC 5.7 7.8 11.1* 6.5 7.6  NEUTROABS  --   --  10.2*  --   --   HGB 10.6* 10.6* 12.8 10.8* 11.6*  HCT 34.0* 33.5* 39.9 33.6* 36.8  MCV 101.8* 101.2* 100.3* 98.8 102.8*  PLT 110* 119* 139* 116* [id number]*   Basic Metabolic Panel: Recent Labs  Lab 03/25/19 0321 03/26/19 0339 03/27/19 0434 03/28/19 0453 03/28/19 1344 03/29/19 0447 03/30/19 0517  NA 139 136 136  --  139  --  141  K 5.1 4.3 4.4  --  3.5  --  3.2*  CL 112* 105 102  --  103  --  104  CO2 21* 23 25  --  28  --  24  GLUCOSE 132* 108* 100*  --  106*  --  88  BUN <5* <5* 5*  --  <5*  --  <5*  CREATININE 0.72 0.76 0.88  --  1.02*  --  0.83  CALCIUM 8.3* 8.7* 8.4*  --  8.2*  --  8.5*  MG 1.8 1.8 1.8 1.9  --  2.1 1.9   GFR: Estimated Creatinine Clearance: 47 mL/min (by C-G formula based on SCr of 0.83 mg/dL). Liver Function Tests: Recent Labs  Lab 03/26/19 0339  AST 22  ALT 19  ALKPHOS 154*  BILITOT 0.5  PROT 5.6*  ALBUMIN 3.1*   No results for input(s): LIPASE, AMYLASE in the last 168 hours. No results for input(s): AMMONIA in the last 168 hours. Coagulation Profile: No results for input(s): INR, PROTIME in the last 168 hours. Cardiac Enzymes: No results for input(s): CKTOTAL, CKMB, CKMBINDEX, TROPONINI in the last 168 hours. BNP (last 3 results) No results for input(s): PROBNP in the last 8760 hours. HbA1C: No results for input(s): HGBA1C in the last 72 hours. CBG: No results  for input(s): GLUCAP in the last 168 hours. Lipid Profile: No results for input(s): CHOL, HDL, LDLCALC, TRIG, CHOLHDL, LDLDIRECT in the last 72 hours. Thyroid Function Tests: No results for input(s): TSH, T4TOTAL, FREET4, T3FREE, THYROIDAB in the last 72 hours. Anemia Panel: No results for input(s): VITAMINB12, FOLATE, FERRITIN, TIBC, IRON, RETICCTPCT in the last 72 hours. Sepsis Labs: No results for input(s): PROCALCITON, LATICACIDVEN in the last 168 hours.  Recent Results (from the past 240 hour(s))  GI pathogen panel by PCR, stool     Status: None   Collection Time: 03/21/19 12:40 PM   Specimen: Stool  Result Value Ref Range Status   Plesiomonas shigelloides NOT DETECTED NOT DETECTED Final   Yersinia enterocolitica NOT DETECTED NOT DETECTED Final   Vibrio NOT DETECTED NOT DETECTED Final   Enteropathogenic E coli NOT DETECTED NOT DETECTED Final   E coli (ETEC) LT/ST NOT DETECTED NOT DETECTED Final   E coli [id number] by PCR Not applicable NOT DETECTED Final   Cryptosporidium by PCR NOT DETECTED NOT DETECTED Final  Entamoeba histolytica NOT DETECTED NOT DETECTED Final   Adenovirus F 40/41 NOT DETECTED NOT DETECTED Final   Norovirus GI/GII NOT DETECTED NOT DETECTED Final   Sapovirus NOT DETECTED NOT DETECTED Final    Comment: (NOTE) Performed At: [facility] [address] [place], [place] [id number] Rush Farmer MD [id number]    Vibrio cholerae NOT DETECTED NOT DETECTED Final   Campylobacter by PCR NOT DETECTED NOT DETECTED Final   Salmonella by PCR NOT DETECTED NOT DETECTED Final   E coli (STEC) NOT DETECTED NOT DETECTED Final   Enteroaggregative E coli NOT DETECTED NOT DETECTED Final   Shigella by PCR NOT DETECTED NOT DETECTED Final   Cyclospora cayetanensis NOT DETECTED NOT DETECTED Final   Astrovirus NOT DETECTED NOT DETECTED Final   G lamblia by PCR NOT DETECTED NOT DETECTED Final   Rotavirus A by PCR NOT DETECTED NOT DETECTED Final  Giardia/Cryptosporidium EIA      Status: Abnormal   Collection Time: 03/21/19 12:40 PM   Specimen: Stool  Result Value Ref Range Status   Giardia Ag, Stl Positive (A) Negative Final   Cryptosporidium EIA Negative Negative Final    Comment: (NOTE) Performed At: [facility] [place], [place] [id number] Rush Farmer MD [id number]    Source of Sample STOOL  Final    Comment: Performed at [facility], [place] [address] [place], [place] [address]  C difficile quick scan w PCR reflex     Status: None   Collection Time: 03/21/19  8:31 PM   Specimen: STOOL  Result Value Ref Range Status   C Diff antigen NEGATIVE NEGATIVE Final   C Diff toxin NEGATIVE NEGATIVE Final   C Diff interpretation No C. difficile detected.  Final    Comment: Performed at [facility], [place] [address] [place], [place] [address]         Radiology Studies: No results found.      Scheduled Meds: . aspirin EC  81 mg Oral Daily  . atenolol  50 mg Oral BID  . busPIRone  5 mg Oral BID  . calcium-vitamin D  1 tablet Oral Daily  . cholecalciferol  1,000 Units Oral BID  . DULoxetine  60 mg Oral Daily  . fesoterodine  4 mg Oral Daily  . heparin  5,000 Units Subcutaneous Q8H  . influenza vaccine adjuvanted  0.5 mL Intramuscular Tomorrow-1000  . latanoprost  1 drop Both Eyes QHS  . levothyroxine  175 mcg Oral Q0600  . lipase/protease/amylase  24,000 Units Oral TID WC  . loratadine  10 mg Oral Daily  . pantoprazole (PROTONIX) IV  40 mg Intravenous Q24H  . polyvinyl alcohol  1 drop Both Eyes BID  . pravastatin  40 mg Oral q1800  . pregabalin  150 mg Oral BID  . rOPINIRole  0.5 mg Oral QHS  . simethicone  80 mg Oral QID  . sodium chloride flush  3 mL Intravenous Once  . triamcinolone ointment  1 application Topical BID   Continuous Infusions: . famotidine (PEPCID) IV 20 mg (03/30/19 1225)     LOS: 10 days     Cordelia Poche, MD [facility] 03/30/2019, 1:42 PM   If 7PM-7AM, please contact night-coverage [email or web address]

## 2019-03-31 LAB — MAGNESIUM: Magnesium: 2 mg/dL (ref 1.7–2.4)

## 2019-03-31 MED ORDER — INFLUENZA VAC A&B SA ADJ QUAD 0.5 ML IM PRSY
0.5000 mL | PREFILLED_SYRINGE | Freq: Once | INTRAMUSCULAR | Status: AC
  Administered 2019-03-31: 17:00:00 0.5 mL via INTRAMUSCULAR
  Filled 2019-03-31: qty 0.5

## 2019-03-31 MED ORDER — PREGABALIN 150 MG PO CAPS: 150.0000 mg | ORAL_CAPSULE | Freq: Two times a day (BID) | ORAL | 0 refills | Status: DC

## 2019-03-31 MED ORDER — DULOXETINE HCL 60 MG PO CPEP: 60.0000 mg | ORAL_CAPSULE | Freq: Every day | ORAL | 0 refills | Status: DC

## 2019-03-31 NOTE — Discharge Summary (Signed)
Physician Discharge Summary  DANYA PIERUCCI [id number] DOB: 06-13-1940 DOA: 03/19/2019  PCP: Briscoe Deutscher, DO  Admit date: 03/19/2019 Discharge date: 03/31/2019  Admitted From: Home Disposition: Home  Recommendations for Outpatient Follow-up:  1. Follow up with PCP in 1 week 2. Follow up with GI in 3 weeks 3. Follow up with oncology 4. Please obtain BMP/CBC in one week 5. Please follow up on the following pending results: None  Home Health: PT Equipment/Devices: RW  Discharge Condition: Stable CODE STATUS: Full code Diet recommendation: Heart healthy   Brief/Interim Summary:  Admission HPI written by Elwyn Reach, MD   Chief Complaint: Multiple falls and dizziness with diarrhea  HPI: [name] is a 78 y.o. 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  Patient also has worsening diarrhea with abdominal cramping.  She has had diarrhea over the last 3 months.  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  Patient has recently been treated with Cipro and Flagyl.  She was previously tested for C. difficile and was negative.  She has been dehydrated.  Still taking Lasix and hydrochlorothiazide.  She is also noted confusion difficulty talking occasionally.  Patient's dizziness is both at rest and when moving.  In the last 48 hours she has fallen.  Primarily son is with the patient giving more history.  She appears very dehydrated and chronically ill looking.  She has not been eating well apparently.  Patient is being admitted with severe dehydration secondary to poor oral intake.  Also colitis as seen on her CT.  ED Course: Temperature is 98 blood pressure is 169/74 with pulse 75  respiratory 21 oxygen sat 97% room air.  Patient is also orthostatic.  Sodium is 139 potassium is 2.9 chloride 104 CO2 of 22 with glucose 118.  BUN is 34 creatinine 1.54 and calcium 8.2.  CBC appear to be within normal urinalysis essentially negative.  CT abdomen pelvis showed colitis involving the hepatic flexure of the colon, evidence of Whipple procedure with diverticulosis.  Patient will be admitted for treatment.   Hospital course:  Acute colitis Unclear etiology. Giardia antigen positive. GI consulted. EGD and sigmoidoscopy. C. Difficile negative. Overall some improvement but still with tenderness. Patient received tinidazole. CT abdomen pelvis on 10/28 is significant for further evidence of colitis with some improvement of other areas of colitis. -GI recommendations: Okay to discharge with follow-up  Acute volume overload From initial fluid resuscitation requiring IV lasix.  Confusion Unsure of etiology but would be concerned initially for medication effect. Upon further review, patient is on significantly high dose of Cymbalta (home dosing) and Lyrica (high for patient's renal function. CrCl of around 45 mL/min). No evidence to suggest infection except for imaging findings of colon. Per discussion with son, patient has a history of mild dementia and has shown signs of delirium in the past. Continue decreased dose of Cymbalta and Lyrica on discharge.  Rash Urticarial appearing. Scattered pattern. No desquamation. Has remained unchanged per patient but appears somewhat improved from previous pictures.  Recurrent falls Dizziness Thought to be secondary to orthostasis from dehydration. PT recommending HHPT and RW.  Hypokalemia Hypomagnesemia Replete as needed.  AKI Creatinine of 1.54 on admission which improved with IV fluids.  Chest burning Sounds likely to  be reflux related. Resolved. Continue Protonix.  Essential hypertension Slightly uncontrolled. Restart home  medications but will discontinue hydrochlorothiazide. Can consider restarting at later date.  History of pancreatic cancer Patient is s/p Whipple procedure. Outpatient oncology management  Neuropathy Patient is on Cymbalta and Lyrica. Lyrica decreased to 150 mg BID and Cymbalta decreased to 60 mg daily.  Thrombocytopenia Chronic and stable.  Klebsiella UTI Completed antibiotics with Keflex. Showing symptoms of recurrent/persistent UTI. Repeat culture pending.   Discharge Diagnoses:  Principal Problem:   Acute colitis Active Problems:   Hypothyroidism, on Levothryoxine   Essential hypertension   GERD, on Prilosec   Osteoarthritis, mulitple sites   Carcinoma of head of pancreas ([place]), followed by Dr [name], Oncology   HLD (hyperlipidemia), on Lovastatin   Diarrhea   Fall at home, initial encounter   Hypokalemia   Acute renal failure (ARF) ([place])    Discharge Instructions   Allergies as of 03/31/2019      Reactions   Metoclopramide Hcl Other (See Comments)   Shaking; caused tardive dyskinesia   Other Other (See Comments)   According to patient Trilafor and Reglan cause Tardive Dyskinesia   Niacin Other (See Comments)   shaking   Trovan [alatrofloxacin Mesylate] Other (See Comments)   Caused shaking and nervousness   Benzocaine-resorcinol Rash   Celecoxib Other (See Comments)   unknown   Erythromycin Base Rash   Glucosamine Other (See Comments)   unknown   Nortriptyline Other (See Comments)   Dizziness    Phenazopyridine Hcl Other (See Comments)   unknown   Sulfa Antibiotics Nausea And Vomiting, Rash   Sulfonamide Derivatives Nausea And Vomiting, Rash      Medication List    STOP taking these medications   ciprofloxacin 250 MG tablet Commonly known as: Cipro   hydrochlorothiazide 12.5 MG capsule Commonly known as: MICROZIDE   metroNIDAZOLE 500 MG tablet Commonly known as: FLAGYL   potassium chloride 10 MEQ tablet Commonly known as: KLOR-CON      TAKE these medications   amLODipine 5 MG tablet Commonly known as: NORVASC TAKE 1 TABLET BY MOUTH EVERY DAY   aspirin EC 81 MG tablet Take 81 mg by mouth daily.   atenolol 50 MG tablet Commonly known as: TENORMIN TAKE 1 TABLET BY MOUTH TWICE A DAY   betamethasone dipropionate 0.05 % lotion Apply 1 application topically daily as needed (itching).   busPIRone 5 MG tablet Commonly known as: BUSPAR Take 1 tablet by mouth twice daily   Calcium 600+D 600-800 MG-UNIT Tabs Generic drug: Calcium Carb-Cholecalciferol Take 1 tablet by mouth daily.   cholecalciferol 1000 units tablet Commonly known as: VITAMIN D Take 1,000 Units by mouth 2 (two) times daily.   Coenzyme Q10 300 MG Caps Take 300 mg by mouth daily.   Creon 24000-76000 units Cpep Generic drug: Pancrelipase (Lip-Prot-Amyl) Take 2 capsules by mouth 3 (three) times daily with meals.   diphenoxylate-atropine 2.5-0.025 MG tablet Commonly known as: LOMOTIL Take 1-2 tablets by mouth 4 (four) times daily as needed for diarrhea or loose stools.   DULoxetine 60 MG capsule Commonly known as: CYMBALTA Take 1 capsule (60 mg total) by mouth daily. What changed: when to take this   furosemide 40 MG tablet Commonly known as: LASIX 1/2 TABLET BY MOUTH DAILY FOR 3 DAYS AND THEN 1 DAILY   HYDROcodone-acetaminophen 5-325 MG tablet Commonly known as: NORCO/VICODIN Take 1 tablet by mouth every 6 (six) hours as needed for moderate pain.   latanoprost 0.005 % ophthalmic  solution Commonly known as: XALATAN Place 1 drop into both eyes at bedtime.   levothyroxine 175 MCG tablet Commonly known as: SYNTHROID TAKE 1 TABLET BY MOUTH  DAILY BEFORE BREAKFAST   lisinopril 40 MG tablet Commonly known as: ZESTRIL TAKE 1 TABLET BY MOUTH EVERY DAY   loperamide 2 MG capsule Commonly known as: IMODIUM Take 1 capsule (2 mg total) by mouth as needed for diarrhea or loose stools. Maximum 8 tabs/day   loratadine 10 MG tablet Commonly known  as: CLARITIN Take 10 mg by mouth daily.   lovastatin 40 MG tablet Commonly known as: MEVACOR TAKE 1 TABLET BY MOUTH EVERYDAY AT BEDTIME   omeprazole 20 MG capsule Commonly known as: PRILOSEC TAKE 1 CAPSULE BY MOUTH EVERY DAY   ondansetron 8 MG tablet Commonly known as: ZOFRAN Take 1 tablet (8 mg total) by mouth every 8 (eight) hours. What changed:   when to take this  reasons to take this   pregabalin 150 MG capsule Commonly known as: Lyrica Take 1 capsule (150 mg total) by mouth 2 (two) times daily. What changed:   medication strength  how much to take   psyllium 0.52 g capsule Commonly known as: REGULOID Take 1.04 g by mouth daily.   Refresh Tears 0.5 % Soln Generic drug: carboxymethylcellulose Place 1 drop into both eyes 2 (two) times daily.   rOPINIRole 0.5 MG tablet Commonly known as: REQUIP TAKE 1 TABLET BY MOUTH AT BEDTIME   sennosides-docusate sodium 8.6-50 MG tablet Commonly known as: SENOKOT-S Take 1 tablet by mouth daily.   simethicone 125 MG chewable tablet Commonly known as: MYLICON Chew 0000000 mg by mouth 3 (three) times daily.   tolterodine 4 MG 24 hr capsule Commonly known as: DETROL LA Take 4 mg by mouth daily.   triamcinolone ointment 0.5 % Commonly known as: KENALOG Apply 1 application topically 2 (two) times daily. What changed:   when to take this  reasons to take this   vitamin C 1000 MG tablet Take 1,000 mg by mouth daily.            Durable Medical Equipment  (From admission, onward)         Start     Ordered   03/22/19 1244  For home use only DME Walker rolling  Once    Question Answer Comment  Patient needs a walker to treat with the following condition Physical deconditioning   Patient needs a walker to treat with the following condition Falls frequently      03/22/19 1243         Follow-up Information    [facility] Follow up.   Specialty: [place] Why: [place] nursing/physical  therapy       Briscoe Deutscher, DO. Schedule an appointment as soon as possible for a visit in 1 week(s).   Specialty: Family Medicine Why: Hospital follow-up Contact information: [place] [address] [phone]        [name], MD. Schedule an appointment as soon as possible for a visit in 3 week(s).   Specialty: Gastroenterology Why: Colitis. Repeat CT scan Contact information: [place] [address] [phone]        Ladell Pier, MD. Schedule an appointment as soon as possible for a visit.   Specialty: Oncology Contact information: [place] [address] [phone]          Allergies  Allergen Reactions  Metoclopramide Hcl Other (See Comments)    Shaking; caused tardive dyskinesia   Other Other (See Comments)    According to patient Trilafor and Reglan cause Tardive Dyskinesia   Niacin Other (See Comments)    shaking   Trovan [Alatrofloxacin Mesylate] Other (See Comments)    Caused shaking and nervousness   Benzocaine-Resorcinol Rash   Celecoxib Other (See Comments)    unknown   Erythromycin Base Rash   Glucosamine Other (See Comments)    unknown   Nortriptyline Other (See Comments)    Dizziness    Phenazopyridine Hcl Other (See Comments)    unknown   Sulfa Antibiotics Nausea And Vomiting and Rash   Sulfonamide Derivatives Nausea And Vomiting and Rash    Consultations:  [facility]   Procedures/Studies: Ct Abdomen Pelvis W Contrast  Result Date: 03/27/2019 CLINICAL DATA:  Epigastric abdominal pain. Diarrhea for the past week. History of Whipple procedure five years ago. Pancreatic head carcinoma. EXAM: CT ABDOMEN AND PELVIS WITH CONTRAST TECHNIQUE: Multidetector CT imaging of the abdomen and pelvis was performed using the standard protocol following bolus administration of intravenous contrast. CONTRAST:  163mL OMNIPAQUE IOHEXOL 300 MG/ML SOLN, <See  Chart> OMNIPAQUE IOHEXOL 300 MG/ML SOLN COMPARISON:  03/19/2019. FINDINGS: Lower chest: Right hemidiaphragm elevation with overlying right base atelectasis. Mild cardiomegaly with multivessel coronary artery atherosclerosis. Trace right pleural fluid, new since the prior CT. Hepatobiliary: Hepatic steatosis. Motion degradation involving the upper abdomen is mild. Sparing in the posterior aspect of segment 4. Pneumobilia. Cholecystectomy and choledochojejunostomy. No biliary duct dilatation. Pancreas: Status post Whipple procedure with an atrophic pancreatic body. Spleen: Normal in size, without focal abnormality. Adrenals/Urinary Tract: Normal adrenal glands. Normal kidneys, without hydronephrosis. Normal urinary bladder. Stomach/Bowel: Tiny hiatal hernia.  Status post gastrojejunostomy. New wall thickening and mucosal hyperenhancement involving the sigmoid and rectum, mild. The hepatic flexure colonic wall thickening has resolved. However, there is new or progressive ascending colonic wall thickening, including on 33/2. Normal terminal ileum and appendix. normal small bowel caliber. Vascular/Lymphatic: Aortic atherosclerosis. Again identified is soft tissue surrounding the SMA including on 29/2. This obliterates the SMV just caudal to the splenoportal confluence, including on 32/2. Otherwise, no abdominal adenopathy.  No pelvic sidewall adenopathy. Reproductive: Hysterectomy.  No adnexal mass. Other: Moderate pelvic floor laxity. No abdominal ascites or evidence of peritoneal metastasis. Multiple fat containing ventral abdominal wall hernias. The upper abdominal hernias are enlarged, including on 34/2 and 40/2. Musculoskeletal: Remote right pubic rami fractures. Osteopenia. Lumbosacral spine fixation. Chronic compression deformity at L2 is mild. There is also stable T10 compression deformity. IMPRESSION: 1. Multifocal colitis, including within the ascending and rectosigmoid regions. Recommend clinical exclusion of  C difficile. Inflammatory colitis could look similar. Not in a typical pattern of ischemia. 2. New small right pleural effusion. 3. Status post Whipple procedure for pancreatic carcinoma. Similar appearance of soft tissue surrounding the SMA and obliterating the SMV, suspicious for recurrent disease. 4. Hepatic steatosis. 5. Fat containing ventral abdominal wall hernias, including enlarging upper abdominal hernias. Electronically Signed   By: Abigail Miyamoto M.D.   On: 03/27/2019 20:00   Ct Abdomen Pelvis W Contrast  Result Date: 03/19/2019 CLINICAL DATA:  Diarrhea x 3 months. Periumbilial LLQ left mid abd tenderness EXAM: CT ABDOMEN AND PELVIS WITH CONTRAST TECHNIQUE: Multidetector CT imaging of the abdomen and pelvis was performed using the standard protocol following bolus administration of intravenous contrast. CONTRAST:  51mL OMNIPAQUE IOHEXOL 300 MG/ML  SOLN COMPARISON:  CT 12/03/2018 FINDINGS: Lower chest: Linear scarring  in the right middle lobe. Compressive atelectasis at the right lung base similar they had hemidiaphragm. Mitral annulus calcifications. No acute findings. Hepatobiliary: Significantly decreased hepatic density consistent with steatosis. Focal fatty sparing again seen. No evidence of focal hepatic lesion. Pneumobilia related to prior Whipple procedure, unchanged from prior. Pancreas: Status post Whipple. Parenchymal atrophy of the pancreatic body and tail. Abnormal soft tissue along the SMA is again seen measuring 1.7 x 1.5 cm, series 2, image 44, not significantly changed. This occludes the SMV and encases the SMA as before. Spleen: Normal in size without focal abnormality. Adrenals/Urinary Tract: Normal adrenal glands. No hydronephrosis or perinephric edema. Homogeneous renal enhancement with symmetric excretion on delayed phase imaging. Urinary bladder is physiologically distended without wall thickening. Stomach/Bowel: Postsurgical changes of the distal stomach. No gastric wall  thickening. No small bowel obstruction, wall thickening or inflammation. Colonic wall thickening with mild pericolonic edema involves the hepatic flexure of the colon. Moderate stool in the more distal colon. There is descending colonic tortuosity. Distal colonic diverticulosis without acute diverticulitis. Vascular/Lymphatic: Aortic atherosclerosis without aneurysm. Patent portal vein. Soft tissue density cases the SMA and obliterates the SMV, unchanged from prior. No adenopathy. Reproductive: Status post hysterectomy. No adnexal masses. Other: No free air, free fluid, or intra-abdominal fluid collection. Small fat containing umbilical hernia with calcification. Fat containing supraumbilical ventral abdominal wall hernias. Musculoskeletal: Intramuscular lipoma within right sartorius muscle. Postsurgical and degenerative change in the spine. L2, T10, and T7 compression fractures are unchanged. Remote right superior and inferior pubic rami fractures. IMPRESSION: 1. Colitis involving the hepatic flexure of the colon, may be infectious or inflammatory. 2. Post Whipple procedure. Unchanged abnormal soft tissue density encases the SMA and obliterates the SMV, stable from July 2020 CT. 3. Hepatic steatosis. 4. Colonic diverticulosis without diverticulitis. Umbilical ventral abdominal hernias containing fat. Aortic Atherosclerosis (ICD10-I70.0). Electronically Signed   By: Keith Rake M.D.   On: 03/19/2019 23:46   Dg Chest Port 1 View  Result Date: 03/25/2019 CLINICAL DATA:  Nonproductive cough and shortness of breath 1-2 weeks. EXAM: PORTABLE CHEST 1 VIEW COMPARISON:  03/10/2017 FINDINGS: Stable elevation of the right hemidiaphragm. Mild bibasilar opacification likely atelectasis, although infection is possible. Small amount right pleural fluid. Cardiomediastinal silhouette and remainder of the exam is unchanged. IMPRESSION: Minimal bibasilar density likely atelectasis and less likely infection. Small amount  right pleural fluid. Stable elevation right hemidiaphragm. Electronically Signed   By: Marin Olp M.D.   On: 03/25/2019 14:23   Dg Abd Portable 1v  Result Date: 03/23/2019 CLINICAL DATA:  Abdominal pain and nausea with loose stool. EXAM: PORTABLE ABDOMEN - 1 VIEW COMPARISON:  CT abdomen pelvis dated March 19, 2019. FINDINGS: The bowel gas pattern is normal. No radio-opaque calculi or other significant radiographic abnormality are seen. No acute osseous abnormality. Old right superior and inferior pubic rami fractures. Prior L3-S1 PLIF. Old L2 compression deformity. Prior cholecystectomy. IMPRESSION: Negative. Electronically Signed   By: Titus Dubin M.D.   On: 03/23/2019 14:48      Subjective: Loose stool this morning. Only one episode today.  Discharge Exam: Vitals:   03/30/19 2230 03/31/19 0630  BP: (!) 142/66 140/76  Pulse: 78 76  Resp: 18 18  Temp: 98.3 F (36.8 C) 98.5 F (36.9 C)  SpO2: 95% 98%   Vitals:   03/30/19 0753 03/30/19 1353 03/30/19 2230 03/31/19 0630  BP:  (!) 142/89 (!) 142/66 140/76  Pulse:  71 78 76  Resp:  18 18 18   Temp:  98.4 F (36.9 C) 98.3 F (36.8 C) 98.5 F (36.9 C)  TempSrc:  Oral  Oral  SpO2: 96% 98% 95% 98%  Weight:      Height:        General: Pt is alert, awake, not in acute distress Cardiovascular: RRR, S1/S2 +, no rubs, no gallops Respiratory: CTA bilaterally, no wheezing, no rhonchi Abdominal: Soft, NT, ND, bowel sounds + Extremities: no edema, no cyanosis    The results of significant diagnostics from this hospitalization (including imaging, microbiology, ancillary and laboratory) are listed below for reference.     Microbiology: Recent Results (from the past 240 hour(s))  C difficile quick scan w PCR reflex     Status: None   Collection Time: 03/21/19  8:31 PM   Specimen: STOOL  Result Value Ref Range Status   C Diff antigen NEGATIVE NEGATIVE Final   C Diff toxin NEGATIVE NEGATIVE Final   C Diff interpretation No  C. difficile detected.  Final    Comment: Performed at [facility], [place] [address], [place], [place] [address]  Culture, Urine     Status: Abnormal   Collection Time: 03/29/19  6:40 PM   Specimen: Urine, Catheterized  Result Value Ref Range Status   Specimen Description   Final    URINE, CATHETERIZED Performed at [facility] [address], [place], [place] [address]    Special Requests   Final    NONE Performed at [facility], [place] [address] [place], [name] [address]    Culture (A)  Final    <10,000 COLONIES/mL INSIGNIFICANT GROWTH Performed at [facility] [address], [place],  [address]    Report Status 03/30/2019 FINAL  Final     Labs: BNP (last 3 results) No results for input(s): BNP in the last 8760 hours. Basic Metabolic Panel: Recent Labs  Lab 03/25/19 0321 03/26/19 0339 03/27/19 0434 03/28/19 0453 03/28/19 1344 03/29/19 0447 03/30/19 0517 03/31/19 0518  NA 139 136 136  --  139  --  141  --   K 5.1 4.3 4.4  --  3.5  --  3.2*  --   CL 112* 105 102  --  103  --  104  --   CO2 21* 23 25  --  28  --  24  --   GLUCOSE 132* 108* 100*  --  106*  --  88  --   BUN <5* <5* 5*  --  <5*  --  <5*  --   CREATININE 0.72 0.76 0.88  --  1.02*  --  0.83  --   CALCIUM 8.3* 8.7* 8.4*  --  8.2*  --  8.5*  --   MG 1.8 1.8 1.8 1.9  --  2.1 1.9 2.0   Liver Function Tests: Recent Labs  Lab 03/26/19 0339  AST 22  ALT 19  ALKPHOS 154*  BILITOT 0.5  PROT 5.6*  ALBUMIN 3.1*   No results for input(s): LIPASE, AMYLASE in the last 168 hours. No results for input(s): AMMONIA in the last 168 hours. CBC: Recent Labs  Lab 03/25/19 0321 03/26/19 0339 03/27/19 0434 03/28/19 0939  WBC 7.8 11.1* 6.5 7.6  NEUTROABS  --  10.2*  --   --   HGB 10.6* 12.8 10.8* 11.6*  HCT 33.5* 39.9 33.6* 36.8  MCV 101.2* 100.3* 98.8 102.8*  PLT 119* 139* 116* 140*   Cardiac Enzymes: No results for input(s): CKTOTAL,  CKMB,  CKMBINDEX, TROPONINI in the last 168 hours. BNP: Invalid input(s): POCBNP CBG: No results for input(s): GLUCAP in the last 168 hours. D-Dimer No results for input(s): DDIMER in the last 72 hours. Hgb A1c No results for input(s): HGBA1C in the last 72 hours. Lipid Profile No results for input(s): CHOL, HDL, LDLCALC, TRIG, CHOLHDL, LDLDIRECT in the last 72 hours. Thyroid function studies No results for input(s): TSH, T4TOTAL, T3FREE, THYROIDAB in the last 72 hours.  Invalid input(s): FREET3 Anemia work up No results for input(s): VITAMINB12, FOLATE, FERRITIN, TIBC, IRON, RETICCTPCT in the last 72 hours. Urinalysis    Component Value Date/Time   COLORURINE YELLOW 03/29/2019 Anon [place] 03/29/2019 1840   LABSPEC 1.005 03/29/2019 1840   PHURINE 7.0 03/29/2019 1840   GLUCOSEU NEGATIVE 03/29/2019 1840   GLUCOSEU NEGATIVE 03/20/2017 1107   HGBUR SMALL (A) 03/29/2019 1840   BILIRUBINUR NEGATIVE 03/29/2019 1840   BILIRUBINUR negative 07/19/2018 1127   BILIRUBINUR Negative 02/15/2018 1029   KETONESUR NEGATIVE 03/29/2019 1840   PROTEINUR NEGATIVE 03/29/2019 1840   UROBILINOGEN 0.2 07/19/2018 1127   UROBILINOGEN 0.2 03/20/2017 1107   NITRITE NEGATIVE 03/29/2019 1840   LEUKOCYTESUR NEGATIVE 03/29/2019 1840   Sepsis Labs Invalid input(s): PROCALCITONIN,  WBC,  LACTICIDVEN Microbiology Recent Results (from the past 240 hour(s))  C difficile quick scan w PCR reflex     Status: None   Collection Time: 03/21/19  8:31 PM   Specimen: STOOL  Result Value Ref Range Status   C Diff antigen NEGATIVE NEGATIVE Final   C Diff toxin NEGATIVE NEGATIVE Final   C Diff interpretation No C. difficile detected.  Final    Comment: Performed at [facility], [place] [address] [place], [place] [address]  Culture, Urine     Status: Abnormal   Collection Time: 03/29/19  6:40 PM   Specimen: Urine, Catheterized  Result Value Ref Range Status   Specimen Description    Final    URINE, CATHETERIZED Performed at [facility] [address], [place], [place] [address]    Special Requests   Final    NONE Performed at [facility], [place] [address], [place], [place] [address]    Culture (A)  Final    <10,000 COLONIES/mL INSIGNIFICANT GROWTH Performed at [facility] [address], [place], [place] [address]    Report Status 03/30/2019 FINAL  Final     Time coordinating discharge: 35 minutes  SIGNED:   Cordelia Poche, MD [facility] 03/31/2019, 3:54 PM

## 2019-03-31 NOTE — Progress Notes (Signed)
Physical Therapy Treatment Patient Details Name: [name] MRN: [id number] DOB: 1941-05-10 Today's Date: 03/31/2019    History of Present Illness Pt is 78 yo female admitted on 03/19/19 with colitis and falls.  She is s/p EGD and upper GI endoscopy.Stool study positive for Giardia. Pt was transferred to telemetry last night - with new rash.    PT Comments    Physician requested PT return visit for possible d/c today; pt with incr activity tolerance, incr gait distance. Not noted to be impulsive, responded to commands and answered questions appropriately.  Pt may benefit from incr initial supervision to transition home, will benefift from [place].   Follow Up Recommendations  Home health PT     Equipment Recommendations  Rolling walker with 5" wheels    Recommendations for Other Services       Precautions / Restrictions Precautions Precautions: Fall Restrictions Weight Bearing Restrictions: No    Mobility  Bed Mobility Overal bed mobility: Needs Assistance             General bed mobility comments: pt in chair on arrival   Transfers Overall transfer level: Needs assistance Equipment used: Rolling walker (2 wheeled)   Sit to Stand: Supervision         General transfer comment: for safety, no physical assist, pt uses correct hand placement   Ambulation/Gait Ambulation/Gait assistance: Supervision;Min guard Gait Distance (Feet): 200 Feet Assistive device: Rolling walker (2 wheeled) Gait Pattern/deviations: Step-through pattern     General Gait Details: intermittent cues for RW position from self, no LOB   Stairs             Wheelchair Mobility    Modified Rankin (Stroke Patients Only)       Balance   Sitting-balance support: No upper extremity supported;Feet unsupported Sitting balance-Leahy Scale: Good     Standing balance support: No upper extremity supported Standing balance-Leahy Scale: Fair                               Cognition Arousal/Alertness: Awake/alert Behavior During Therapy: WFL for tasks assessed/performed                           Following Commands: Follows one step commands consistently;Follows multi-step commands consistently       General Comments: pt oriented x3 today; following functional commands consistently. impulsivity not noted thi session      Exercises      General Comments        Pertinent Vitals/Pain Pain Assessment: 0-10 Pain Score: 3  Pain Location: bil feet  Pain Descriptors / Indicators: Burning Pain Intervention(s): Monitored during session(baseline neuropathy)    Home Living                      Prior Function            PT Goals (current goals can now be found in the care plan section) Acute Rehab PT Goals Patient Stated Goal: return home with HHPT PT Goal Formulation: With patient/family Time For Goal Achievement: 04/05/19 Potential to Achieve Goals: Good Progress towards PT goals: Progressing toward goals    Frequency    Min 3X/week      PT Plan Current plan remains appropriate    Co-evaluation              AM-PAC PT "6 Clicks" Mobility   Outcome  Measure  Help needed turning from your back to your side while in a flat bed without using bedrails?: None Help needed moving from lying on your back to sitting on the side of a flat bed without using bedrails?: None Help needed moving to and from a bed to a chair (including a wheelchair)?: None Help needed standing up from a chair using your arms (e.g., wheelchair or bedside chair)?: A Little Help needed to walk in hospital room?: A Little Help needed climbing 3-5 steps with a railing? : A Little 6 Click Score: 21    End of Session Equipment Utilized During Treatment: Gait belt Activity Tolerance: Patient tolerated treatment well Patient left: in chair;with call bell/phone within reach;with chair alarm set;with family/visitor present Nurse Communication:  Mobility status PT Visit Diagnosis: Other abnormalities of gait and mobility (R26.89);History of falling (Z91.81);Muscle weakness (generalized) (M62.81)     Time: [id number] PT Time Calculation (min) (ACUTE ONLY): 19 min  Charges:  $Gait Training: 8-22 mins                     Kenyon Ana, PT  Pager: [phone] Acute Rehab Dept [facility]): [id number]   03/31/2019    [facility] 03/31/2019, 2:43 PM

## 2019-03-31 NOTE — Discharge Instructions (Signed)
[name]  You were in the hospital with abdominal pain and found to have inflammation of your colon. This may be infectious or related to blood flow but it is not clear. Your symptoms improved and you will need close follow-up from your PCP and the gastroenterologist.

## 2019-03-31 NOTE — Progress Notes (Signed)
Patient and her son, Sela Ruzzo, given discharge, follow up,and medication instructions, verbalized understanding, IV removed, personal belongings with patient, family to transport home

## 2019-04-01 ENCOUNTER — Encounter: Payer: Self-pay | Admitting: Oncology

## 2019-04-01 ENCOUNTER — Telehealth: Payer: Self-pay | Admitting: Oncology

## 2019-04-01 NOTE — Telephone Encounter (Signed)
Ok to convert to phone visit

## 2019-04-01 NOTE — Telephone Encounter (Signed)
Returned patient's phone call regarding rescheduling 10/01 appointment, per patient's son request it has been rescheduled for 11/03. Patient needs a phone visit.  Message to provider.

## 2019-04-02 ENCOUNTER — Inpatient Hospital Stay: Payer: Medicare Other | Attending: Oncology | Admitting: Oncology

## 2019-04-02 ENCOUNTER — Telehealth (INDEPENDENT_AMBULATORY_CARE_PROVIDER_SITE_OTHER): Payer: Self-pay | Admitting: Family Medicine

## 2019-04-02 DIAGNOSIS — N390 Urinary tract infection, site not specified: Secondary | ICD-10-CM | POA: Diagnosis not present

## 2019-04-02 DIAGNOSIS — R410 Disorientation, unspecified: Secondary | ICD-10-CM | POA: Diagnosis not present

## 2019-04-02 DIAGNOSIS — L509 Urticaria, unspecified: Secondary | ICD-10-CM | POA: Diagnosis not present

## 2019-04-02 DIAGNOSIS — K228 Other specified diseases of esophagus: Secondary | ICD-10-CM | POA: Diagnosis not present

## 2019-04-02 DIAGNOSIS — G629 Polyneuropathy, unspecified: Secondary | ICD-10-CM | POA: Diagnosis not present

## 2019-04-02 DIAGNOSIS — N179 Acute kidney failure, unspecified: Secondary | ICD-10-CM | POA: Diagnosis not present

## 2019-04-02 DIAGNOSIS — E8779 Other fluid overload: Secondary | ICD-10-CM | POA: Diagnosis not present

## 2019-04-02 DIAGNOSIS — G8929 Other chronic pain: Secondary | ICD-10-CM | POA: Diagnosis not present

## 2019-04-02 DIAGNOSIS — E876 Hypokalemia: Secondary | ICD-10-CM | POA: Diagnosis not present

## 2019-04-02 DIAGNOSIS — E039 Hypothyroidism, unspecified: Secondary | ICD-10-CM | POA: Diagnosis not present

## 2019-04-02 DIAGNOSIS — M8949 Other hypertrophic osteoarthropathy, multiple sites: Secondary | ICD-10-CM | POA: Diagnosis not present

## 2019-04-02 DIAGNOSIS — F0280 Dementia in other diseases classified elsewhere without behavioral disturbance: Secondary | ICD-10-CM | POA: Diagnosis not present

## 2019-04-02 DIAGNOSIS — I341 Nonrheumatic mitral (valve) prolapse: Secondary | ICD-10-CM | POA: Diagnosis not present

## 2019-04-02 DIAGNOSIS — F329 Major depressive disorder, single episode, unspecified: Secondary | ICD-10-CM | POA: Diagnosis not present

## 2019-04-02 DIAGNOSIS — C250 Malignant neoplasm of head of pancreas: Secondary | ICD-10-CM

## 2019-04-02 DIAGNOSIS — K219 Gastro-esophageal reflux disease without esophagitis: Secondary | ICD-10-CM | POA: Diagnosis not present

## 2019-04-02 DIAGNOSIS — B961 Klebsiella pneumoniae [K. pneumoniae] as the cause of diseases classified elsewhere: Secondary | ICD-10-CM | POA: Diagnosis not present

## 2019-04-02 DIAGNOSIS — I10 Essential (primary) hypertension: Secondary | ICD-10-CM | POA: Diagnosis not present

## 2019-04-02 DIAGNOSIS — D696 Thrombocytopenia, unspecified: Secondary | ICD-10-CM | POA: Diagnosis not present

## 2019-04-02 DIAGNOSIS — H409 Unspecified glaucoma: Secondary | ICD-10-CM | POA: Diagnosis not present

## 2019-04-02 DIAGNOSIS — R42 Dizziness and giddiness: Secondary | ICD-10-CM | POA: Diagnosis not present

## 2019-04-02 DIAGNOSIS — A071 Giardiasis [lambliasis]: Secondary | ICD-10-CM | POA: Diagnosis not present

## 2019-04-02 DIAGNOSIS — I69344 Monoplegia of lower limb following cerebral infarction affecting left non-dominant side: Secondary | ICD-10-CM | POA: Diagnosis not present

## 2019-04-02 DIAGNOSIS — R296 Repeated falls: Secondary | ICD-10-CM | POA: Diagnosis not present

## 2019-04-02 NOTE — Telephone Encounter (Signed)
You are listed as PCP ok to give orders?

## 2019-04-02 NOTE — Telephone Encounter (Signed)
See note

## 2019-04-02 NOTE — Telephone Encounter (Signed)
ok 

## 2019-04-02 NOTE — Progress Notes (Signed)
[place] OFFICE VISIT PROGRESS NOTE   I connected with [name] on 04/02/19 at 12:30 PM EST by telephone and verified that I am speaking with the correct person using two identifiers.   I discussed the limitations, risks, security and privacy concerns of performing an evaluation and management service by telemedicine and the availability of in-person appointments. I also discussed with the patient that there may be a patient responsible charge related to this service. The patient expressed understanding and agreed to proceed.  Other persons participating in the visit and their role in the encounter: Son  Patient's location: Home Provider's location: Office   Diagnosis: Pancreas cancer  INTERVAL HISTORY:   [name] is seen today for a telehealth visit.  This is due to distancing with the Covid pandemic.  [name] continues to have intermittent diarrhea.  She was admitted with dehydration on 03/19/2019.  A CT of the abdomen revealed evidence of colitis.  A stool Giardia antigen returned positive.  She was treated with tinidazole.  She was also treated for urinary tract infection. [name] was discharged home on 03/31/2019.  Her son is present by telephone for today's visit.  She reports a good appetite.  Her pain is improved.  She reports partial improvement with diarrhea when she takes Lomotil.    Lab Results:  Lab Results  Component Value Date   WBC 7.6 03/28/2019   HGB 11.6 (L) 03/28/2019   HCT 36.8 03/28/2019   MCV 102.8 (H) 03/28/2019   PLT 140 (L) 03/28/2019   NEUTROABS 10.2 (H) 03/26/2019    Imaging:  No results found.  Medications: I have reviewed the patient's current medications.  Assessment/Plan: 1. 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  CT abdomen/pelvis 01/19/2015 with no evidence of recurrent/metastatic disease.  CT abdomen/pelvis 07/20/2018-infiltrating soft tissue at the pancreas resection bed with involvement of the SMA, occlusion of the SMV with collaterals. New L2 compression fracture  SBRT beginning 08/21/2018, completed 08/31/2018, 5 fractions  CT 12/03/2018-no change in abnormal soft tissue encasing the SMA  CT 03/27/2019-new wall thickening and mucosal enhancement in the sigmoid and rectum, hepatic flexure wall thickening has resolved new or progressive ascending colonic wall thickening.  Persistent soft tissue surrounding the SMA-stable, no ascites or evidence of peritoneal metastases, enlarging ventral hernias  2. Nausea following the Whipple procedure-improved 3. Mild thrombocytopenia , Chronic 4.Pain secondary to local recurrence of pancreas cancer 5.  Diarrhea-etiology unclear, Giardia antigen positive 03/21/2019, treated with 10 it is all     Disposition: [name] has a history of pancreas cancer with local recurrence at the SMA treated with radiation earlier this year.  There is no clinical or radiologic evidence of disease progression.  She recently completed treatment for Giardia.  She has persistent diarrhea.  She will continue follow-up with gastroenterology for evaluation and management of diarrhea.  She will be scheduled for a telehealth visit in 2 months.  I am available to see her sooner as needed. I reviewed the CT images from 03/27/2019.  I discussed the assessment and treatment plan with the patient. The patient was provided an opportunity to ask questions and all were answered. The patient agreed with the plan  and demonstrated an understanding of the instructions.   The patient was advised to call back or seek  an in-person evaluation if the symptoms worsen or if the condition fails to improve as anticipated.  I provided 22 minutes of telephone, chart review, and documentation time during this encounter, and > 50% was spent counseling as documented under my assessment & plan.  Betsy Coder ANP/GNP-BC   04/02/2019 12:48 PM

## 2019-04-02 NOTE — Telephone Encounter (Signed)
Home Health Verbal Orders - Caller/Agency: [facility] Callback Number: [phone] vm can be left Requesting Skilled Nursing Frequency: 2x's a week for 1 week  1x a week for 4 weeks and every other week for 2 weeks    Speech Therapy consult to help with cognitive  issues

## 2019-04-03 ENCOUNTER — Telehealth: Payer: Self-pay | Admitting: Oncology

## 2019-04-03 ENCOUNTER — Telehealth: Payer: Self-pay | Admitting: Physician Assistant

## 2019-04-03 ENCOUNTER — Telehealth (INDEPENDENT_AMBULATORY_CARE_PROVIDER_SITE_OTHER): Payer: Self-pay | Admitting: Family Medicine

## 2019-04-03 DIAGNOSIS — C250 Malignant neoplasm of head of pancreas: Secondary | ICD-10-CM | POA: Diagnosis not present

## 2019-04-03 DIAGNOSIS — A071 Giardiasis [lambliasis]: Secondary | ICD-10-CM | POA: Diagnosis not present

## 2019-04-03 DIAGNOSIS — R42 Dizziness and giddiness: Secondary | ICD-10-CM | POA: Diagnosis not present

## 2019-04-03 DIAGNOSIS — E039 Hypothyroidism, unspecified: Secondary | ICD-10-CM | POA: Diagnosis not present

## 2019-04-03 DIAGNOSIS — I10 Essential (primary) hypertension: Secondary | ICD-10-CM | POA: Diagnosis not present

## 2019-04-03 DIAGNOSIS — R296 Repeated falls: Secondary | ICD-10-CM | POA: Diagnosis not present

## 2019-04-03 NOTE — Telephone Encounter (Signed)
See below

## 2019-04-03 NOTE — Telephone Encounter (Signed)
Called orders given to [facility].

## 2019-04-03 NOTE — Telephone Encounter (Signed)
[name], with [facility], calling to request orders for PT for 8 visits to work on balance and reducing fall potential.

## 2019-04-03 NOTE — Telephone Encounter (Signed)
Error

## 2019-04-03 NOTE — Telephone Encounter (Signed)
Scheduled per [place]. Called and spoke with patient. Confirmed appt 

## 2019-04-04 ENCOUNTER — Encounter: Payer: Self-pay | Admitting: Oncology

## 2019-04-04 NOTE — Telephone Encounter (Signed)
Left detailed message on [employer]. Verbal orders given for PT x 8 weeks to work on balance and reducing fall potential for patient, okay per [place]. Any questions please call office.

## 2019-04-05 ENCOUNTER — Other Ambulatory Visit: Payer: Self-pay | Admitting: *Deleted

## 2019-04-05 MED ORDER — DIPHENOXYLATE-ATROPINE 2.5-0.025 MG PO TABS: 1.0000 | ORAL_TABLET | Freq: Four times a day (QID) | ORAL | 0 refills | Status: DC | PRN

## 2019-04-07 ENCOUNTER — Encounter: Payer: Self-pay | Admitting: Oncology

## 2019-04-08 ENCOUNTER — Telehealth (INDEPENDENT_AMBULATORY_CARE_PROVIDER_SITE_OTHER): Payer: Self-pay | Admitting: Family Medicine

## 2019-04-08 DIAGNOSIS — R296 Repeated falls: Secondary | ICD-10-CM | POA: Diagnosis not present

## 2019-04-08 DIAGNOSIS — R42 Dizziness and giddiness: Secondary | ICD-10-CM | POA: Diagnosis not present

## 2019-04-08 DIAGNOSIS — A071 Giardiasis [lambliasis]: Secondary | ICD-10-CM | POA: Diagnosis not present

## 2019-04-08 DIAGNOSIS — C250 Malignant neoplasm of head of pancreas: Secondary | ICD-10-CM | POA: Diagnosis not present

## 2019-04-08 DIAGNOSIS — E039 Hypothyroidism, unspecified: Secondary | ICD-10-CM | POA: Diagnosis not present

## 2019-04-08 DIAGNOSIS — I10 Essential (primary) hypertension: Secondary | ICD-10-CM | POA: Diagnosis not present

## 2019-04-08 NOTE — Telephone Encounter (Signed)
Home Health Verbal Orders - Caller/Agency: [place] Number: [phone] Requesting OT/PT/Skilled Nursing/Social Work/Speech Therapy: Speech Therapy Frequency:  2 week 1 1 week 2

## 2019-04-08 NOTE — Telephone Encounter (Signed)
Ok to order/ sign.  thanks

## 2019-04-08 NOTE — Telephone Encounter (Signed)
Called [facility] and spoke to [place], verbal orders given for Speech Therapy for pt 2 x's a week for 1 week, then 1 x a week for 1 week okay per Dr. Jonni Sanger. Bethena Roys verbalized understanding.

## 2019-04-08 NOTE — Telephone Encounter (Signed)
Please see message and advise. Pt is scheduled to see you 11/11.

## 2019-04-08 NOTE — Telephone Encounter (Signed)
See note

## 2019-04-09 ENCOUNTER — Inpatient Hospital Stay: Payer: Medicare Other | Admitting: Physician Assistant

## 2019-04-10 ENCOUNTER — Ambulatory Visit (INDEPENDENT_AMBULATORY_CARE_PROVIDER_SITE_OTHER): Payer: Medicare Other | Admitting: Family Medicine

## 2019-04-10 ENCOUNTER — Telehealth (INDEPENDENT_AMBULATORY_CARE_PROVIDER_SITE_OTHER): Payer: Medicare Other | Admitting: Cardiovascular Disease

## 2019-04-10 ENCOUNTER — Encounter: Payer: Self-pay | Admitting: Family Medicine

## 2019-04-10 ENCOUNTER — Telehealth: Payer: Self-pay | Admitting: Family Medicine

## 2019-04-10 ENCOUNTER — Inpatient Hospital Stay: Payer: Medicare Other | Admitting: Family Medicine

## 2019-04-10 VITALS — BP 119/85 | HR 81 | Ht 61.0 in | Wt 139.0 lb

## 2019-04-10 VITALS — BP 119/85 | HR 81 | Wt 139.0 lb

## 2019-04-10 DIAGNOSIS — E7800 Pure hypercholesterolemia, unspecified: Secondary | ICD-10-CM | POA: Diagnosis not present

## 2019-04-10 DIAGNOSIS — C250 Malignant neoplasm of head of pancreas: Secondary | ICD-10-CM | POA: Diagnosis not present

## 2019-04-10 DIAGNOSIS — R410 Disorientation, unspecified: Secondary | ICD-10-CM

## 2019-04-10 DIAGNOSIS — N179 Acute kidney failure, unspecified: Secondary | ICD-10-CM

## 2019-04-10 DIAGNOSIS — K529 Noninfective gastroenteritis and colitis, unspecified: Secondary | ICD-10-CM

## 2019-04-10 DIAGNOSIS — I10 Essential (primary) hypertension: Secondary | ICD-10-CM

## 2019-04-10 DIAGNOSIS — R0602 Shortness of breath: Secondary | ICD-10-CM

## 2019-04-10 DIAGNOSIS — E039 Hypothyroidism, unspecified: Secondary | ICD-10-CM | POA: Diagnosis not present

## 2019-04-10 DIAGNOSIS — I2510 Atherosclerotic heart disease of native coronary artery without angina pectoris: Secondary | ICD-10-CM

## 2019-04-10 DIAGNOSIS — R296 Repeated falls: Secondary | ICD-10-CM | POA: Diagnosis not present

## 2019-04-10 DIAGNOSIS — Z79899 Other long term (current) drug therapy: Secondary | ICD-10-CM | POA: Diagnosis not present

## 2019-04-10 DIAGNOSIS — K551 Chronic vascular disorders of intestine: Secondary | ICD-10-CM | POA: Diagnosis not present

## 2019-04-10 DIAGNOSIS — N3000 Acute cystitis without hematuria: Secondary | ICD-10-CM | POA: Diagnosis not present

## 2019-04-10 DIAGNOSIS — I5032 Chronic diastolic (congestive) heart failure: Secondary | ICD-10-CM

## 2019-04-10 DIAGNOSIS — R42 Dizziness and giddiness: Secondary | ICD-10-CM | POA: Diagnosis not present

## 2019-04-10 DIAGNOSIS — A071 Giardiasis [lambliasis]: Secondary | ICD-10-CM | POA: Diagnosis not present

## 2019-04-10 NOTE — Patient Instructions (Signed)
Medication Instructions:  Your physician recommends that you continue on your current medications as directed. Please refer to the Current Medication list given to you today.   *If you need a refill on your cardiac medications before your next appointment, please call your pharmacy*  Lab Work: NONE   Testing/Procedures: NONE   Follow-Up: August 09, 2019 AT 1:40 IN OFFICE WITH DR [facility]
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
## 2019-04-10 NOTE — Telephone Encounter (Signed)
Please call her son and let him know that she was due for lab work f/u one week after hospital discharge per hospital note. they are a little over due. I recommended they go back to ER, but he was going to call GI first.  Orma Flaming, MD [place]

## 2019-04-10 NOTE — Progress Notes (Signed)
Patient: [name] MRN: [id number] DOB: [birth year] PCP: Briscoe Deutscher, DO     I connected with [name] on 04/10/19 at 11:05pm by a video enabled telemedicine application and verified that I am speaking with the correct person using two identifiers.  Location patient: Home Location provider: [facility], Office Persons participating in this virtual visit: Evoni Guevarra, her son-randy Ritchie. Therapist with home health is there with vitals.    I discussed the limitations of evaluation and management by telemedicine and the availability of in person appointments. The patient expressed understanding and agreed to proceed.   Subjective:  Chief Complaint  Patient presents with  . Hospitalization Follow-up  . Diarrhea    x4 months    HPI: The patient is a 78 y.o. female who presents today for hospital follow up.  Admit date: 03/19/2019 Discharge date: 03/31/2019  Brief hospital course: admitted for colitis of unclear etiology. Presented to ED with dizziness, multiple falls and diarrhea. She has hx of chornic diarrhea over the last 3 months and was diagnosed with radiation enteritis. She was recently treated with cipro and flagyl. Was also confused and had difficulty talking occasionally. She was admitted for severe dehydration and colitis seen on CT.   Colitis: unclear etiology. Giardia antigen was positive. GI was consulted and EGD and sigmoidoscopy were done. C.diff was negative. CT abdo/pelvis on 10/28 showed multifocal colitis, including within the ascending and rectosigmoid regions. Not typical of ischemia. Diarrhea treated with tinidazole and seemed to be better per GI note prior to d/c. biopsies on sigmoid were negative for any kind of colitis. Recommended repeat CT scan in 3-4 weeks with GI (Dr. Alessandra Bevels).   Son states she is still having bad diarrhea, about 1-7 episodes/day. He is unsure why they discharged her from the hospital. She denies any blood in her stool. Still  having stomach pain, but same as it was in the hospital. She is eating and drinking normally. He states the diarrhea is getting worse. No fever/chills. No n/v/.  AKI: creatinine on admission was 1.54. this resoled with IVF and was normal at time of discharge. She is eating and drinking normally at this time.    HTN: was labile in hospital. Stopped the hctz as she was on both hctz and lasix and thought to be contributing to her her dehydration/dizziness. BP is good today. 132/74 on repeat with therapist.   UTI; showed klebsiella. Completed keflex. Repeat culture is pending. (<10,000 colonies, insignificant growth)    Confusion: hx of mild dementia and has shown signs of delirium in past. ? If medication related. High dose of cymbalta and lyrica for her renal function. They decreased dose of both of these on discharge. Decreased lyrica to 150mg  bid and cymbalta to 60mg  daily.   Review of Systems  Constitutional: Negative for appetite change, chills and fever.  Respiratory: Negative for cough and shortness of breath.   Cardiovascular: Negative for chest pain, palpitations and leg swelling.  Gastrointestinal: Positive for abdominal pain and diarrhea. Negative for abdominal distention, blood in stool, nausea and vomiting.  Genitourinary: Negative for dysuria, frequency and urgency.  Skin: Negative for pallor.  Neurological: Positive for dizziness and weakness. Negative for facial asymmetry and headaches.    Allergies Patient is allergic to metoclopramide hcl; other; niacin; trovan [alatrofloxacin mesylate]; benzocaine-resorcinol; celecoxib; erythromycin base; glucosamine; nortriptyline; phenazopyridine hcl; sulfa antibiotics; and sulfonamide derivatives.  Past Medical History Patient  has a past medical history of Abnormality of gait (03/27/2014), Allergic rhinitis, Ankle fracture,  bimalleolar, closed (08/16/2015), B12 deficiency (08/08/2017), Cancer ([place]) (07/10/13), Chronic maxillary sinusitis,  Closed fracture of ramus of right pubis ([place]) (11/22/2016), Depression, Eustachian tube dysfunction, GERD (gastroesophageal reflux disease), Glaucoma, Heart murmur, Hiatal hernia, Hypertension, Hypothyroid, Memory loss, Mitral valve prolapse, Neuropathy, Open Colles' fracture of right radius (11/02/2016), Osteoarthritis, Sacral fracture, closed ([place]) (01/07/2017), Stroke [facility]), 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  Surgical History Patient  has a past surgical history that includes Knee surgery (Left); 1 baker cyst removed; Dilation and curettage of uterus; Lumbar spine surgery; Abdominal hysterectomy; Lumbar spine surgery; Joint replacement; Blepharoplasty (Bilateral); Eye surgery (Right); EUS (N/A, 07/10/2013); Fine needle aspiration (N/A, 07/10/2013); Back surgery; Breast surgery; Colonoscopy w/ polypectomy; laparoscopy (N/A, 08/07/2013); Whipple procedure (N/A, 08/07/2013); Esophagogastroduodenoscopy (N/A, 09/11/2013); ORIF ankle fracture (Right, 08/16/2015); Radioactive seed guided excisional breast biopsy (Left, 12/15/2015); Breast excisional biopsy; ORIF wrist fracture (Right, 11/01/2016); Hardware Removal (Right, 12/15/2016); Fracture surgery; Esophagogastroduodenoscopy (N/A, 03/21/2019); Flexible sigmoidoscopy (N/A, 03/21/2019); and biopsy (03/21/2019).  Family History Pateint's family history includes Alzheimer's disease in her father; Cancer in her mother; Other (age of onset: 52) in her brother.  Social History Patient  reports that she has never smoked. She has never used smokeless tobacco. She reports that she does not drink alcohol or use drugs.    Objective: Vitals:   04/10/19 1023  BP: 119/85  Pulse: 81  Weight: 139 lb (63 kg)    Body mass index is 26.26 kg/m.  Physical Exam Vitals signs reviewed.  Constitutional:      General: She is not in acute distress.    Appearance: Normal appearance. She is not ill-appearing or  toxic-appearing.  HENT:     Head: Normocephalic and atraumatic.  Pulmonary:     Effort: Pulmonary effort is normal.  Neurological:     General: No focal deficit present.     Mental Status: She is alert. Mental status is at baseline.     Cranial Nerves: No cranial nerve deficit.  Psychiatric:        Mood and Affect: Mood normal.        Behavior: Behavior normal.     Comments: Son tells history         Assessment/plan 1. Acute colitis Recommend she returns to hospital for repeat CT scan and labs to make sure not dehydrated, no renal insult and that her colitis is not worsening. Again, recommended ED visit.  She was due for lab follow up one week after and has not done this. Also recommended they call GI physician as well.   2. Acute renal failure, unspecified acute renal failure type ([place]) Resolved with ivf. Recommended repeating today.   3. Essential hypertension To goal today. Would stay off hctz and f/u with pcp for med rec and repeat bp.   4. Acute cystitis without hematuria Treated. Repeat culture was negative. reviewed results.   5. Confusion Appears to be better per son. Could have been worse due to hospitalization as well as polypharmacy. Continue to monitor outpatient.   6. Polypharmacy Continue low dose cymbalta and lyrica. Will need to have a good med rec with pcp and see if there is any other drugs on BEERS criteria that they could decrease or cut out all together. Continue course until pcp followup .       Return for sent to ER .  Records requested if needed. Time spent with patient: 35 minutes, of which >50% was spent in obtaining information about her symptoms, reviweing  her previous labs, evaluations, and treatments, counseling her about her conditions (please see discussed topics above), and developing a plan to further investigate it; she had a number of questions which I addressed.    Orma Flaming, MD [place]  04/10/2019

## 2019-04-11 ENCOUNTER — Other Ambulatory Visit: Payer: Self-pay

## 2019-04-11 ENCOUNTER — Encounter (HOSPITAL_COMMUNITY): Payer: Self-pay | Admitting: Emergency Medicine

## 2019-04-11 ENCOUNTER — Telehealth (INDEPENDENT_AMBULATORY_CARE_PROVIDER_SITE_OTHER): Payer: Self-pay | Admitting: Family Medicine

## 2019-04-11 ENCOUNTER — Emergency Department (HOSPITAL_COMMUNITY)
Admission: EM | Admit: 2019-04-11 | Discharge: 2019-04-11 | Disposition: A | Discharge status: Home / Self Care | Payer: Medicare Other | Attending: Emergency Medicine | Admitting: Emergency Medicine

## 2019-04-11 DIAGNOSIS — K529 Noninfective gastroenteritis and colitis, unspecified: Secondary | ICD-10-CM

## 2019-04-11 DIAGNOSIS — Z7982 Long term (current) use of aspirin: Secondary | ICD-10-CM | POA: Diagnosis not present

## 2019-04-11 DIAGNOSIS — I10 Essential (primary) hypertension: Secondary | ICD-10-CM | POA: Diagnosis not present

## 2019-04-11 DIAGNOSIS — Z8673 Personal history of transient ischemic attack (TIA), and cerebral infarction without residual deficits: Secondary | ICD-10-CM | POA: Insufficient documentation

## 2019-04-11 DIAGNOSIS — C250 Malignant neoplasm of head of pancreas: Secondary | ICD-10-CM | POA: Diagnosis not present

## 2019-04-11 DIAGNOSIS — R197 Diarrhea, unspecified: Secondary | ICD-10-CM | POA: Diagnosis not present

## 2019-04-11 DIAGNOSIS — Z79899 Other long term (current) drug therapy: Secondary | ICD-10-CM | POA: Diagnosis not present

## 2019-04-11 DIAGNOSIS — I2510 Atherosclerotic heart disease of native coronary artery without angina pectoris: Secondary | ICD-10-CM | POA: Insufficient documentation

## 2019-04-11 DIAGNOSIS — R296 Repeated falls: Secondary | ICD-10-CM | POA: Diagnosis not present

## 2019-04-11 DIAGNOSIS — E039 Hypothyroidism, unspecified: Secondary | ICD-10-CM | POA: Diagnosis not present

## 2019-04-11 DIAGNOSIS — R42 Dizziness and giddiness: Secondary | ICD-10-CM | POA: Diagnosis not present

## 2019-04-11 DIAGNOSIS — A071 Giardiasis [lambliasis]: Secondary | ICD-10-CM | POA: Diagnosis not present

## 2019-04-11 DIAGNOSIS — R109 Unspecified abdominal pain: Secondary | ICD-10-CM | POA: Diagnosis not present

## 2019-04-11 LAB — COMPREHENSIVE METABOLIC PANEL
ALT: 30 U/L (ref 0–44)
AST: 34 U/L (ref 15–41)
Albumin: 4 g/dL (ref 3.5–5.0)
Alkaline Phosphatase: 199 U/L — ABNORMAL HIGH (ref 38–126)
Anion gap: 12 (ref 5–15)
BUN: 13 mg/dL (ref 8–23)
CO2: 27 mmol/L (ref 22–32)
Calcium: 9.1 mg/dL (ref 8.9–10.3)
Chloride: 101 mmol/L (ref 98–111)
Creatinine, Ser: 0.84 mg/dL (ref 0.44–1.00)
GFR calc Af Amer: >60 mL/min (ref >60)
GFR calc non Af Amer: >60 mL/min (ref >60)
Glucose, Bld: 100 mg/dL — ABNORMAL HIGH (ref 70–99)
Potassium: 3.6 mmol/L (ref 3.5–5.1)
Sodium: 140 mmol/L (ref 135–145)
Total Bilirubin: 0.7 mg/dL (ref 0.3–1.2)
Total Protein: 7.5 g/dL (ref 6.5–8.1)

## 2019-04-11 LAB — URINALYSIS, ROUTINE W REFLEX MICROSCOPIC
Bilirubin Urine: NEGATIVE
Glucose, UA: NEGATIVE mg/dL
Hgb urine dipstick: NEGATIVE
Ketones, ur: NEGATIVE mg/dL
Nitrite: POSITIVE — AB
Protein, ur: NEGATIVE mg/dL
Specific Gravity, Urine: 1.006 (ref 1.005–1.030)
pH: 5 (ref 5.0–8.0)

## 2019-04-11 LAB — LIPASE, BLOOD: Lipase: 15 U/L (ref 11–51)

## 2019-04-11 LAB — CBC
HCT: 36.2 % (ref 36.0–46.0)
Hemoglobin: 11.4 g/dL — ABNORMAL LOW (ref 12.0–15.0)
MCH: 32.9 pg (ref 26.0–34.0)
MCHC: 31.5 g/dL (ref 30.0–36.0)
MCV: 104.3 fL — ABNORMAL HIGH (ref 80.0–100.0)
Platelets: 211 10*3/uL (ref 150–400)
RBC: 3.47 MIL/uL — ABNORMAL LOW (ref 3.87–5.11)
RDW: 16.7 % — ABNORMAL HIGH (ref 11.5–15.5)
WBC: 5.8 10*3/uL (ref 4.0–10.5)
nRBC: 0 % (ref 0.0–0.2)

## 2019-04-11 LAB — POC OCCULT BLOOD, ED: Fecal Occult Bld: NEGATIVE

## 2019-04-11 MED ORDER — SODIUM CHLORIDE 0.9% FLUSH: 3.0000 mL | Freq: Once | INTRAVENOUS | Status: DC

## 2019-04-11 NOTE — Telephone Encounter (Signed)
Pt son is taking her to ER.

## 2019-04-11 NOTE — Telephone Encounter (Signed)
Thank him for listening to my medical advice. She needs to be at ER.  Orma Flaming, MD [place]

## 2019-04-11 NOTE — Telephone Encounter (Signed)
See note

## 2019-04-11 NOTE — Telephone Encounter (Signed)
Pt son [name] calling back and would like a call back from [place]. Please advise

## 2019-04-11 NOTE — ED Triage Notes (Signed)
Pt reports that she been having diarrhea for couple months with lower abd pains. Reports "the more I try to get rid of it the more it comes".

## 2019-04-11 NOTE — Telephone Encounter (Signed)
Needs cbc and cmp. Please order this for her to come in.  Put colitis on diagnosis.  Orma Flaming, MD [place]

## 2019-04-11 NOTE — Discharge Instructions (Addendum)
There is no evidence for internal bleeding or significant blood loss.  C she does not appear to be dehydrated.  Continue using antidiarrheal medication of choice.  Make sure she is drinking plenty of fluids.  Follow-up with the GI doctor next week for further evaluation and treatment

## 2019-04-11 NOTE — Telephone Encounter (Signed)
Please tell nurse ans son I had appointment with them yesterday and advised to go to ER. Would again suggest this since could have bleed....  Dr. Rogers Blocker

## 2019-04-11 NOTE — Telephone Encounter (Signed)
Copied from [place] [id number]. Topic: General - Other >> Apr 11, 2019  8:48 AM Keene Breath wrote: Reason for CRM: Called to ask the nurse or doctor if they want her to get labs from the patient since she will be seeing patient around 10am this morning.  Please advise and call to let her know at [phone]

## 2019-04-11 NOTE — Telephone Encounter (Signed)
Spoke with son, Noraida Khosravi.  Had lengthy conversation with son that Dr. Rogers Blocker strongly advises to take patient to ED for further evaluation to r/o GI bleed.  I explained to son that this was recommended yesterday 11/11 during doxy visit between patient/son/Dr. Rogers Blocker.  Dr. Rogers Blocker also wanted labs drawn for cbc and cmp (see message below from home health nurse) Lab appointment scheduled for 11/13 @ 2:15pm.  Patient's son very reluctant to take patient to ED because he states that patient has GI appt on Monday 11/16 (no pending appts w/GI within [facility] in chart unless pt has appt w/GI doc outside of [facility]) he also does not want to take her because of her diarrhea and the fact that she may be waiting for hours before being brought back to be seen.  Son asking if we (our office) could do anything about the wait time; I explained that we have no control over this, the ED triages patients and sees them in order of their presenting complaints.  Son stated that he wanted to speak to his mother and ask what she wanted to do regarding this issue and would contact me back.  I also advised him to please keep appointment for labs tomorrow and that they would be contacted with any urgent/critical results tomorrow and we would address at that time if necessary.

## 2019-04-11 NOTE — Telephone Encounter (Signed)
Orders placed for labs, will contact son to schedule lab appointment.  Forwarding to you FYI

## 2019-04-11 NOTE — Telephone Encounter (Signed)
Copied from [place] [id number]. Topic: General - Other >> Apr 11, 2019 11:59 AM Leward Quan A wrote: Reason for CRM: Beth with [facility] called to say that patient reported to her that she had a black stool last night and then while Beth was in the home patient had another black stool in diarrhea form. Asking for Inda Coke to please reach out to the patient. Any further questions Beth Ph# [phone]
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
## 2019-04-11 NOTE — Telephone Encounter (Signed)
Hi Delsa Sale will you speak with Dr. Rogers Blocker in regards to this pt she saw her on yesterday via doxy.

## 2019-04-12 ENCOUNTER — Other Ambulatory Visit: Payer: Medicare Other

## 2019-04-12 ENCOUNTER — Telehealth: Payer: Self-pay | Admitting: Family Medicine

## 2019-04-12 NOTE — Telephone Encounter (Signed)
I reviewed the note.  The diarrhea may be difficult to resolve; she will need to see the gastroenterologist as scheduled for further evaluation and management.  In the ER, the good news is that she was stable; not dehydrated and her labs were good.   She can use medications that she was instructed to use upon discharge.  At this time, GI consult and time is what is needed.

## 2019-04-12 NOTE — Telephone Encounter (Signed)
Spoke with son and advised per notes of Dr. Jonni Sanger.  He verbalized understanding.

## 2019-04-12 NOTE — Telephone Encounter (Signed)
Patient's son [name], is calling to let Dr. Jonni Sanger know that the patient went to the ED yesterday. Lab work was completed at the hospital. Patient still has diarrhea. Nothing was found in the lab work.

## 2019-04-12 NOTE — Telephone Encounter (Signed)
See below

## 2019-04-12 NOTE — Telephone Encounter (Signed)
Pls see msg below-FYI

## 2019-04-15 ENCOUNTER — Telehealth: Payer: Self-pay

## 2019-04-15 NOTE — Telephone Encounter (Signed)
Copied from [place] [id number]. Topic: General - Inquiry >> Apr 15, 2019  3:26 PM Virl Axe D wrote: Reason for CRM: Beth with [facility] did a home health visit and pt told them she had a fall over the weekend. They suggest pt getting an order for labs as pt has had constant diarrhea. Pt was scheduled for GI appt today but was unable to get out of the house due to the diarrhea. Stated they can do labs at her home, they just need an order. Please advise.

## 2019-04-16 DIAGNOSIS — R42 Dizziness and giddiness: Secondary | ICD-10-CM | POA: Diagnosis not present

## 2019-04-16 DIAGNOSIS — A071 Giardiasis [lambliasis]: Secondary | ICD-10-CM | POA: Diagnosis not present

## 2019-04-16 DIAGNOSIS — C250 Malignant neoplasm of head of pancreas: Secondary | ICD-10-CM | POA: Diagnosis not present

## 2019-04-16 DIAGNOSIS — R296 Repeated falls: Secondary | ICD-10-CM | POA: Diagnosis not present

## 2019-04-16 DIAGNOSIS — I10 Essential (primary) hypertension: Secondary | ICD-10-CM | POA: Diagnosis not present

## 2019-04-16 DIAGNOSIS — E039 Hypothyroidism, unspecified: Secondary | ICD-10-CM | POA: Diagnosis not present

## 2019-04-17 ENCOUNTER — Other Ambulatory Visit: Payer: Self-pay

## 2019-04-17 ENCOUNTER — Ambulatory Visit: Payer: Self-pay | Admitting: Family Medicine

## 2019-04-17 DIAGNOSIS — E039 Hypothyroidism, unspecified: Secondary | ICD-10-CM | POA: Diagnosis not present

## 2019-04-17 DIAGNOSIS — I10 Essential (primary) hypertension: Secondary | ICD-10-CM | POA: Diagnosis not present

## 2019-04-17 DIAGNOSIS — R296 Repeated falls: Secondary | ICD-10-CM | POA: Diagnosis not present

## 2019-04-17 DIAGNOSIS — C250 Malignant neoplasm of head of pancreas: Secondary | ICD-10-CM | POA: Diagnosis not present

## 2019-04-17 DIAGNOSIS — A071 Giardiasis [lambliasis]: Secondary | ICD-10-CM | POA: Diagnosis not present

## 2019-04-17 DIAGNOSIS — R42 Dizziness and giddiness: Secondary | ICD-10-CM | POA: Diagnosis not present

## 2019-04-17 DIAGNOSIS — R197 Diarrhea, unspecified: Secondary | ICD-10-CM

## 2019-04-17 NOTE — Telephone Encounter (Signed)
Home Health nurse 'Beth' calling, pt present during call.Reports diarrhea, ongoing episodes  "For months." Directed to ED 02/09/2019. GI consult scheduled for this past Monday;Pt had to cancel due to diarrhea. States diarrhea is watery, not dark, no visible blood. States diarrhea is severe, has been taking lomotil, ineffective.  During home visit this afternoon, nurse reports BP sitting 100/60, standing 90/60  HR 72. Reports last week BP 138/82, usual BP 120's/ 70's. Reports pt lightheaded with positional changes, "Very pale." States P.T. saw today and noted "Pt was not well." Pt tested neg for C-diff 9/12 ED visit. Did test positive for giardia 10/22. Also reports LUQ tenderness with palpation. Pt directed to ED. Home Health nurse reluctant to advise pt as "They just sent her home last time and nothing was resolved." States very difficult for pt to be seen in ED.  [place] called practice, spoke with Amber who reiterated need for ED eval.  Nurse states she will advise pt "But not sure she will go again."  Assured TN will route encounter to practice for Dr. Tamela Oddi review.   Reason for Disposition . [1] Drinking very little AND [2] dehydration suspected (e.g., no urine > 12 hours, very dry mouth, very lightheaded)  Answer Assessment - Initial Assessment Questions 1. DIARRHEA SEVERITY: "How bad is the diarrhea?" "How many extra stools have you had in the past 24 hours than normal?"    - NO DIARRHEA (SCALE 0)   - MILD (SCALE 1-3): Few loose or mushy BMs; increase of 1-3 stools over normal daily number of stools; mild increase in ostomy output.   -  MODERATE (SCALE 4-7): Increase of 4-6 stools daily over normal; moderate increase in ostomy output. * SEVERE (SCALE 8-10; OR 'WORST POSSIBLE'): Increase of 7 or more stools daily over normal; moderate increase in ostomy output; incontinence.    Severe 2. ONSET: "When did the diarrhea begin?"     "Months ago" 3. BM CONSISTENCY: "How loose or watery is the  diarrhea?"      Watery 4. VOMITING: "Are you also vomiting?" If so, ask: "How many times in the past 24 hours?"      No, nausea present 5. ABDOMINAL PAIN: "Are you having any abdominal pain?" If yes: "What does it feel like?" (e.g., crampy, dull, intermittent, constant)      LUQ tenderness with palpation. 6. ABDOMINAL PAIN SEVERITY: If present, ask: "How bad is the pain?"  (e.g., Scale 1-10; mild, moderate, or severe)   - MILD (1-3): doesn't interfere with normal activities, abdomen soft and not tender to touch    - MODERATE (4-7): interferes with normal activities or awakens from sleep, tender to touch    - SEVERE (8-10): excruciating pain, doubled over, unable to do any normal activities       Mild-moderate 7. ORAL INTAKE: If vomiting, "Have you been able to drink liquids?" "How much fluids have you had in the past 24 hours?"     na 8. HYDRATION: "Any signs of dehydration?" (e.g., dry mouth [not just dry lips], too weak to stand, dizziness, new weight loss) "When did you last urinate?"    Dizzy, BP low 9. EXPOSURE: "Have you traveled to a foreign country recently?" "Have you been exposed to anyone with diarrhea?" "Could you have eaten any food that was spoiled?"     10. ANTIBIOTIC USE: "Are you taking antibiotics now or have you taken antibiotics in the past 2 months?"       *No Answer* 11. OTHER SYMPTOMS: "  Do you have any other symptoms?" (e.g., fever, blood in stool)       no 1  Protocols used: DIARRHEA-A-AH

## 2019-04-17 NOTE — Telephone Encounter (Signed)
LM for Beth with info regarding labs and GI office.

## 2019-04-17 NOTE — Telephone Encounter (Signed)
pls see message below and advise

## 2019-04-17 NOTE — Telephone Encounter (Signed)
Please order CBC and BMP.  Needs to get to GI for further evaluation.

## 2019-04-17 NOTE — Telephone Encounter (Signed)
Sending to pcp to address.  Orma Flaming, MD [place]

## 2019-04-18 ENCOUNTER — Telehealth: Payer: Self-pay

## 2019-04-18 ENCOUNTER — Telehealth: Payer: Self-pay | Admitting: Family Medicine

## 2019-04-18 DIAGNOSIS — K8689 Other specified diseases of pancreas: Secondary | ICD-10-CM | POA: Diagnosis not present

## 2019-04-18 DIAGNOSIS — R197 Diarrhea, unspecified: Secondary | ICD-10-CM | POA: Diagnosis not present

## 2019-04-18 DIAGNOSIS — C259 Malignant neoplasm of pancreas, unspecified: Secondary | ICD-10-CM | POA: Diagnosis not present

## 2019-04-18 DIAGNOSIS — K580 Irritable bowel syndrome with diarrhea: Secondary | ICD-10-CM | POA: Diagnosis not present

## 2019-04-18 DIAGNOSIS — G894 Chronic pain syndrome: Secondary | ICD-10-CM | POA: Diagnosis not present

## 2019-04-18 NOTE — Telephone Encounter (Signed)
Copied from [place] [id number]. Topic: General - Other >> Apr 18, 2019  4:57 PM Rainey Pines A wrote: Patients son Danaya Kitchel returned Jennifer's call and also stated that [facility] doctor has but patient on flagyl. If patient isnt feeling better by 11/23 another colonoscopy will be scheduled .  Please advise

## 2019-04-18 NOTE — Telephone Encounter (Signed)
Has patient rescheduled with GI for further recommendations to manage diarrhea? Does she feel well enough to get to the GI office if we can get her an appointment.  With diarrhea, she needs to keep up with fluid losses with oral hydration. Is she drinking fluids regularly, gatorade, water, broths etc?  I can see her in the office if she feels stable; however, I do not have recommendation for the diarrhea: NEEDS GI.  IF she is faint in spite of drinking plenty of fluids or sob or heart is racing, then ER would be necessary for IV fluids.  She is on multiple blood pressure pills and lasix. She should hold Lasix, amlodipine and lisinopril for now. Her blood pressure is low so stop these medications.   Needs OV with me. I have not ever seen her.  NEEDS GI too.

## 2019-04-18 NOTE — Telephone Encounter (Signed)
FYI

## 2019-04-18 NOTE — Telephone Encounter (Signed)
Judy with [facility] called to let us know pt had a missed visit this week due to doctors appointment.

## 2019-04-18 NOTE — Telephone Encounter (Signed)
Left vm messages for both sons, [name] and Gerald Stabs and requested a c/b from either of them regarding below message from Dr. Jonni Sanger.

## 2019-04-18 NOTE — Telephone Encounter (Signed)
Please see message and advise further.  Thanks

## 2019-04-18 NOTE — Telephone Encounter (Signed)
See note

## 2019-04-19 ENCOUNTER — Ambulatory Visit: Payer: Medicare Other | Admitting: Family Medicine

## 2019-04-19 DIAGNOSIS — R197 Diarrhea, unspecified: Secondary | ICD-10-CM | POA: Diagnosis not present

## 2019-04-19 NOTE — Telephone Encounter (Signed)
Spoke with son, see telephone encounter dated 11/19.

## 2019-04-19 NOTE — Telephone Encounter (Signed)
Spoke to patient's son, [name].  Advised of all notes below per Dr. Jonni Sanger.  Roger verbalized understanding and states that patient saw GI specialist, Dr. Oletta Lamas.  Son states that patient is currently not taking the furosemide and he will hold giving his mom lisinopril or amlodipine for now until further notice.  She is tolerating liquids fine and diarrhea has somewhat improved.  [name] states that he will contact our office next week to schedule an OV with Dr. Jonni Sanger.

## 2019-04-23 ENCOUNTER — Telehealth: Payer: Self-pay | Admitting: Family Medicine

## 2019-04-23 NOTE — Telephone Encounter (Signed)
Copied from [place] [phone]. Topic: General - Other >> Apr 23, 2019  1:25 PM Keene Breath wrote: Reason for CRM: Called to inform the office of patient's two missed visits due to chronic diarrhea.  Would like verbal orders to add the visits to the end of her orders.  Please advise and call to confirm at [phone]

## 2019-04-23 NOTE — Telephone Encounter (Signed)
Returned the call and provided VO,

## 2019-04-23 NOTE — Telephone Encounter (Signed)
See below

## 2019-04-24 DIAGNOSIS — E039 Hypothyroidism, unspecified: Secondary | ICD-10-CM | POA: Diagnosis not present

## 2019-04-24 DIAGNOSIS — I10 Essential (primary) hypertension: Secondary | ICD-10-CM | POA: Diagnosis not present

## 2019-04-24 DIAGNOSIS — C250 Malignant neoplasm of head of pancreas: Secondary | ICD-10-CM | POA: Diagnosis not present

## 2019-04-24 DIAGNOSIS — R296 Repeated falls: Secondary | ICD-10-CM | POA: Diagnosis not present

## 2019-04-24 DIAGNOSIS — R42 Dizziness and giddiness: Secondary | ICD-10-CM | POA: Diagnosis not present

## 2019-04-24 DIAGNOSIS — A071 Giardiasis [lambliasis]: Secondary | ICD-10-CM | POA: Diagnosis not present

## 2019-04-30 ENCOUNTER — Other Ambulatory Visit: Payer: Self-pay

## 2019-04-30 ENCOUNTER — Telehealth: Payer: Self-pay | Admitting: Family Medicine

## 2019-04-30 DIAGNOSIS — G8929 Other chronic pain: Secondary | ICD-10-CM

## 2019-04-30 DIAGNOSIS — W19XXXD Unspecified fall, subsequent encounter: Secondary | ICD-10-CM

## 2019-04-30 DIAGNOSIS — R3915 Urgency of urination: Secondary | ICD-10-CM

## 2019-04-30 DIAGNOSIS — L509 Urticaria, unspecified: Secondary | ICD-10-CM

## 2019-04-30 DIAGNOSIS — I341 Nonrheumatic mitral (valve) prolapse: Secondary | ICD-10-CM

## 2019-04-30 DIAGNOSIS — E785 Hyperlipidemia, unspecified: Secondary | ICD-10-CM

## 2019-04-30 DIAGNOSIS — Z923 Personal history of irradiation: Secondary | ICD-10-CM

## 2019-04-30 DIAGNOSIS — F0280 Dementia in other diseases classified elsewhere without behavioral disturbance: Secondary | ICD-10-CM

## 2019-04-30 DIAGNOSIS — R410 Disorientation, unspecified: Secondary | ICD-10-CM

## 2019-04-30 DIAGNOSIS — D696 Thrombocytopenia, unspecified: Secondary | ICD-10-CM

## 2019-04-30 DIAGNOSIS — E039 Hypothyroidism, unspecified: Secondary | ICD-10-CM | POA: Diagnosis not present

## 2019-04-30 DIAGNOSIS — Z7982 Long term (current) use of aspirin: Secondary | ICD-10-CM

## 2019-04-30 DIAGNOSIS — J320 Chronic maxillary sinusitis: Secondary | ICD-10-CM

## 2019-04-30 DIAGNOSIS — F329 Major depressive disorder, single episode, unspecified: Secondary | ICD-10-CM

## 2019-04-30 DIAGNOSIS — K219 Gastro-esophageal reflux disease without esophagitis: Secondary | ICD-10-CM

## 2019-04-30 DIAGNOSIS — I10 Essential (primary) hypertension: Secondary | ICD-10-CM

## 2019-04-30 DIAGNOSIS — R296 Repeated falls: Secondary | ICD-10-CM | POA: Diagnosis not present

## 2019-04-30 DIAGNOSIS — E538 Deficiency of other specified B group vitamins: Secondary | ICD-10-CM

## 2019-04-30 DIAGNOSIS — A071 Giardiasis [lambliasis]: Secondary | ICD-10-CM | POA: Diagnosis not present

## 2019-04-30 DIAGNOSIS — Z9181 History of falling: Secondary | ICD-10-CM

## 2019-04-30 DIAGNOSIS — H409 Unspecified glaucoma: Secondary | ICD-10-CM

## 2019-04-30 DIAGNOSIS — C250 Malignant neoplasm of head of pancreas: Secondary | ICD-10-CM | POA: Diagnosis not present

## 2019-04-30 DIAGNOSIS — I7789 Other specified disorders of arteries and arterioles: Secondary | ICD-10-CM

## 2019-04-30 DIAGNOSIS — E8779 Other fluid overload: Secondary | ICD-10-CM

## 2019-04-30 DIAGNOSIS — I69344 Monoplegia of lower limb following cerebral infarction affecting left non-dominant side: Secondary | ICD-10-CM

## 2019-04-30 DIAGNOSIS — R42 Dizziness and giddiness: Secondary | ICD-10-CM | POA: Diagnosis not present

## 2019-04-30 DIAGNOSIS — T50905S Adverse effect of unspecified drugs, medicaments and biological substances, sequela: Secondary | ICD-10-CM

## 2019-04-30 DIAGNOSIS — Z8781 Personal history of (healed) traumatic fracture: Secondary | ICD-10-CM

## 2019-04-30 DIAGNOSIS — G629 Polyneuropathy, unspecified: Secondary | ICD-10-CM

## 2019-04-30 DIAGNOSIS — K449 Diaphragmatic hernia without obstruction or gangrene: Secondary | ICD-10-CM

## 2019-04-30 DIAGNOSIS — K228 Other specified diseases of esophagus: Secondary | ICD-10-CM

## 2019-04-30 DIAGNOSIS — G2401 Drug induced subacute dyskinesia: Secondary | ICD-10-CM

## 2019-04-30 DIAGNOSIS — N390 Urinary tract infection, site not specified: Secondary | ICD-10-CM

## 2019-04-30 DIAGNOSIS — E876 Hypokalemia: Secondary | ICD-10-CM

## 2019-04-30 DIAGNOSIS — M8949 Other hypertrophic osteoarthropathy, multiple sites: Secondary | ICD-10-CM

## 2019-04-30 DIAGNOSIS — B961 Klebsiella pneumoniae [K. pneumoniae] as the cause of diseases classified elsewhere: Secondary | ICD-10-CM

## 2019-04-30 DIAGNOSIS — N179 Acute kidney failure, unspecified: Secondary | ICD-10-CM

## 2019-04-30 DIAGNOSIS — Z79891 Long term (current) use of opiate analgesic: Secondary | ICD-10-CM

## 2019-04-30 NOTE — Telephone Encounter (Signed)
See note  Copied from [place] [id number]. Topic: Quick Communication - Home Health Verbal Orders >> Apr 30, 2019  1:51 PM Virl Axe D wrote: Caller/Agency: Bethena Roys, [place] Number: [phone] VM Requesting OT/PT/Skilled Nursing/Social Work/Speech Therapy: Speech Therapy Frequency: Extend 1 week 4

## 2019-05-01 ENCOUNTER — Encounter: Payer: Self-pay | Admitting: Family Medicine

## 2019-05-01 ENCOUNTER — Ambulatory Visit (INDEPENDENT_AMBULATORY_CARE_PROVIDER_SITE_OTHER): Payer: Medicare Other | Admitting: Family Medicine

## 2019-05-01 VITALS — BP 122/80 | HR 76 | Temp 97.1°F | Ht 61.0 in | Wt 147.0 lb

## 2019-05-01 DIAGNOSIS — K529 Noninfective gastroenteritis and colitis, unspecified: Secondary | ICD-10-CM | POA: Diagnosis not present

## 2019-05-01 DIAGNOSIS — K8681 Exocrine pancreatic insufficiency: Secondary | ICD-10-CM | POA: Diagnosis not present

## 2019-05-01 DIAGNOSIS — M810 Age-related osteoporosis without current pathological fracture: Secondary | ICD-10-CM

## 2019-05-01 DIAGNOSIS — C250 Malignant neoplasm of head of pancreas: Secondary | ICD-10-CM | POA: Diagnosis not present

## 2019-05-01 DIAGNOSIS — I10 Essential (primary) hypertension: Secondary | ICD-10-CM | POA: Diagnosis not present

## 2019-05-01 DIAGNOSIS — E039 Hypothyroidism, unspecified: Secondary | ICD-10-CM

## 2019-05-01 DIAGNOSIS — L84 Corns and callosities: Secondary | ICD-10-CM

## 2019-05-01 NOTE — Progress Notes (Signed)
Subjective  CC:  Chief Complaint  Patient presents with  . Diarrhea    GI follow up on 05/07/2019   . Fatigue  . Dizziness    HPI: [name] is a 78 y.o. female who presents to [facility] today to establish care with me as a new patient.   She has the following concerns or needs:  Met with pt and son: reviewed pmh in detail. Reviewed well documented GI history from Dr.Edwards note from last week.   Briefly, 78 yo with recurrent pancreatic cancer and recent recurrent colitis with diarrhea; still undergoing eval and tx by gi. Had been orthostatic and has had falls. Still is weak and tired. Not in pain. Appetite is fair. Weight is increasing. bp is now holding steady since stopping the amlodipine and lisinopril and lasix.   Son is concerned because she is often sleepy. Reviewed med list  Peripheral neuropathy on lyrica and cymbalta: son worries cymbalta is contributing to diarrhea. Painful neuropathy is an active concern. Has prn norco for pain, and back pain.  Osteoporosis with last T = -3.3 in 2018. On calcium and vit D only. Falls at home. High fracture risk.   Mood/ ok on meds.   Complains of painful "warts" on bottom of feet.   Wants to remain independent in her home. Lives alone.   Wt Readings from Last 3 Encounters:  05/01/19 147 lb (66.7 kg)  04/10/19 139 lb (63 kg)  04/10/19 139 lb (63 kg)    Assessment  1. Colitis   2. Carcinoma of head of pancreas ([place]), followed by Dr [name], Oncology   3. Essential hypertension   4. Callus of foot   5. Exocrine pancreatic insufficiency, Rx Creon   6. Age-related osteoporosis without current pathological fracture   7. Hypothyroidism, unspecified type      Plan   Colitis: per GI. Discussed avoiding dehyrdation and monitoring bp regularly.   Pancreatic cancer managed by oncology. No pain  HTN: stable now off meds; continue atenolol only. Will add back meds if elevates again  Callus:  to podiatry for paring. It is large  Osteoporosis with high fall/fracture risk: repeat bone density and treat if life expectancy warrants it.   Continue other meds for now.  Goals of care discussion today.   Close f/u.  Follow up:  Return in about 6 weeks (around 06/12/2019) for recheck. Orders Placed This Encounter  Procedures  . Dexa BC  . Ambulatory referral to Podiatry   No orders of the defined types were placed in this encounter.    Depression screen [facility] 2/9 02/14/2019 08/08/2017  Decreased Interest 3 3  Down, Depressed, Hopeless 1 1  PHQ - 2 Score 4 4  Altered sleeping 1 3  Tired, decreased energy 2 3  Change in appetite 0 3  Feeling bad or failure about yourself  - 0  Trouble concentrating 0 1  Moving slowly or fidgety/restless 0 1  Suicidal thoughts 0 0  PHQ-9 Score 7 15  Difficult doing work/chores - Not difficult at all  Some recent data might be hidden    We updated and reviewed the patient's past history in detail and it is documented below.  Patient Active Problem List   Diagnosis Date Noted  . Diarrhea 03/20/2019  . Fall at home, initial encounter 03/20/2019  . Acute colitis 03/20/2019  . Hypokalemia 03/20/2019  . Acute renal failure (ARF) ([place]) 03/20/2019  . Aortic atherosclerosis ([place]) 01/05/2019  .  3-vessel CAD, on CT chest 01/05/2019  . Primary open angle glaucoma (POAG) of both eyes, moderate stage 04/18/2018  . Pseudophakia, right eye 04/18/2018  . History of lumbar fusion 11/01/2017  . Tardive dyskinesia, vitamin E   . History of stroke   . Mitral valve prolapse   . Chronic maxillary sinusitis, using NetiPot, flonase   . 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 Chronic left sacral fracture with mild edema, MRI 07/2017 01/07/2017    IMPRESSION: 1. Progression of L3-4 adjacent segment disease with progressive disc and facet degenerative changes resulting in severe right moderate left foraminal stenosis and severe canal stenosis.  2. Chronic left sacral fracture with mild edema. 3. No acute osseous abnormality of the lumbar spine.   . Thrombocytopenia ([place]), mild, chronic 01/07/2017  . HLD (hyperlipidemia), on Lovastatin 07/28/2015  . Fatty liver, on CT 09/09/14 09/09/2014  . Exocrine pancreatic insufficiency, Rx Creon 12/30/2013  . Carcinoma of head of pancreas [facility]), followed by Dr [name], Oncology 07/01/2013    Pancreas cancer-clinical stage I (T1 N0 M0)   Normal preoperative CA 19-9  Status post a pancreaticoduodenectomy procedure 08/07/2013 confirming a moderately differentiated (T3 N0) tumor with negative surgical margins, 12 negative lymph nodes, no lymphovascular invasion, perineural invasion present  CT abdomen/pelvis 02/15/2014 with no evidence of recurrent/metastatic disease.  CT abdomen/pelvis 01/19/2015 with no evidence of recurrent/metastatic disease.  CT abdomen/pelvis 07/20/2018-infiltrating soft tissue at the pancreas resection bed with involvement of the SMA, occlusion of the SMV with collaterals. New L2 compression fracture  SBRT beginning 08/21/2018, completed 08/31/2018, 5 fractions  CT 12/03/2018, no change in abnormal soft tissue encasing the SMA   . Peripheral neuropathy, Lyrica 400 mg BID 08/30/2012  . Nuclear sclerosis, left 04/13/2011  . 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 Colon polyps 06/05/2008  . Osteoarthritis, mulitple sites 06/04/2008  . Chronic diarrhea, intermittent 04/22/2008  . Hypothyroidism, on Levothryoxine 02/26/2007  . Adjustment disorder with mixed anxiety and depressed mood, on Cymbalta and Buspar 02/26/2007  . Essential hypertension 02/26/2007  . GERD, on Prilosec 02/26/2007  . Osteoporosis, on Calcium and Vitamin D 02/26/2007   Health Maintenance  Topic Date Due  . URINE MICROALBUMIN  08/28/1950  . DEXA SCAN  10/06/2018  . TETANUS/TDAP  11/02/2026  . INFLUENZA VACCINE  Completed  . PNA vac Low Risk Adult  Completed   Immunization History  Administered  Date(s) Administered  . Fluad Quad(high Dose 65+) 03/31/2019  . Influenza Whole 02/22/2008, 03/31/2009, 04/06/2010, 04/13/2012  . Influenza, High Dose Seasonal PF 03/25/2016, 03/02/2017, 03/27/2018  . Influenza,inj,Quad PF,6+ Mos 04/16/2014, 02/12/2015  . Influenza-Unspecified 03/10/2017  . Pneumococcal Conjugate-13 09/02/2014  . Pneumococcal Polysaccharide-23 06/09/2008, 03/22/2019  . Tdap 11/01/2016   Current Meds  Medication Sig  . Ascorbic Acid (VITAMIN C) 1000 MG tablet Take 1,000 mg by mouth daily.  Marland Kitchen aspirin EC 81 MG tablet Take 81 mg by mouth daily.  Marland Kitchen atenolol (TENORMIN) 50 MG tablet TAKE 1 TABLET BY MOUTH TWICE A DAY  . betamethasone dipropionate 0.05 % lotion Apply 1 application topically daily as needed (itching).   . busPIRone (BUSPAR) 5 MG tablet Take 1 tablet by mouth twice daily  . Calcium Carb-Cholecalciferol (CALCIUM 600+D) 600-800 MG-UNIT TABS Take 1 tablet by mouth daily.  . carboxymethylcellulose (REFRESH TEARS) 0.5 % SOLN Place 1 drop into both eyes 2 (two) times daily.   . cholecalciferol (VITAMIN D) 1000 units tablet Take 1,000 Units by mouth 2 (two) times daily.  . Coenzyme Q10 300 MG CAPS  Take 300 mg by mouth daily.  . diphenoxylate-atropine (LOMOTIL) 2.5-0.025 MG tablet Take 1-2 tablets by mouth 4 (four) times daily as needed for diarrhea or loose stools.  . DULoxetine (CYMBALTA) 60 MG capsule Take 1 capsule (60 mg total) by mouth daily.  Marland Kitchen HYDROcodone-acetaminophen (NORCO/VICODIN) 5-325 MG tablet Take 1 tablet by mouth every 6 (six) hours as needed for moderate pain.  Marland Kitchen latanoprost (XALATAN) 0.005 % ophthalmic solution Place 1 drop into both eyes at bedtime.   Marland Kitchen levothyroxine (SYNTHROID, LEVOTHROID) 175 MCG tablet TAKE 1 TABLET BY MOUTH  DAILY BEFORE BREAKFAST  . loperamide (IMODIUM) 2 MG capsule Take 1 capsule (2 mg total) by mouth as needed for diarrhea or loose stools. Maximum 8 tabs/day  . loratadine (CLARITIN) 10 MG tablet Take 10 mg by mouth daily.  Marland Kitchen  lovastatin (MEVACOR) 40 MG tablet TAKE 1 TABLET BY MOUTH EVERYDAY AT BEDTIME  . omeprazole (PRILOSEC) 20 MG capsule TAKE 1 CAPSULE BY MOUTH EVERY DAY  . ondansetron (ZOFRAN) 8 MG tablet Take 1 tablet (8 mg total) by mouth every 8 (eight) hours. (Patient taking differently: Take 8 mg by mouth every 8 (eight) hours as needed for nausea or vomiting. )  . Pancrelipase, Lip-Prot-Amyl, (CREON) 24000-76000 units CPEP Take 2 capsules by mouth 3 (three) times daily with meals.   . pregabalin (LYRICA) 150 MG capsule Take 1 capsule (150 mg total) by mouth 2 (two) times daily.  . psyllium (REGULOID) 0.52 g capsule Take 1.04 g by mouth daily.   Marland Kitchen rOPINIRole (REQUIP) 0.5 MG tablet TAKE 1 TABLET BY MOUTH AT BEDTIME  . sennosides-docusate sodium (SENOKOT-S) 8.6-50 MG tablet Take 1 tablet by mouth daily.   . simethicone (MYLICON) 407 MG chewable tablet Chew 125 mg by mouth 3 (three) times daily.  Marland Kitchen tolterodine (DETROL LA) 4 MG 24 hr capsule Take 4 mg by mouth daily.   Marland Kitchen triamcinolone ointment (KENALOG) 0.5 % Apply 1 application topically 2 (two) times daily. (Patient taking differently: Apply 1 application topically 2 (two) times daily as needed (irritation). )  . [DISCONTINUED] furosemide (LASIX) 40 MG tablet Take 40 mg by mouth.  . [DISCONTINUED] lisinopril (ZESTRIL) 40 MG tablet TAKE 1 TABLET BY MOUTH EVERY DAY    Allergies: Patient is allergic to carafate [sucralfate]; metoclopramide hcl; other; niacin; trovan [alatrofloxacin mesylate]; benzocaine-resorcinol; celecoxib; erythromycin base; glucosamine; nortriptyline; phenazopyridine hcl; sulfa antibiotics; and sulfonamide derivatives. Past Medical History Patient  has a past medical history of Abnormality of gait (03/27/2014), Allergic rhinitis, Ankle fracture, bimalleolar, closed (08/16/2015), B12 deficiency (08/08/2017), Cancer ([place]) (07/10/13), Chronic maxillary sinusitis, Closed fracture of ramus of right pubis ([place]) (11/22/2016), Depression, Eustachian tube  dysfunction, GERD (gastroesophageal reflux disease), Glaucoma, Heart murmur, Hiatal hernia, Hypertension, Hypothyroid, Memory loss, Mitral valve prolapse, Neuropathy, Open Colles' fracture of right radius (11/02/2016), Osteoarthritis, Sacral fracture, closed ([place]) (01/07/2017), Stroke [facility]), Superior mesenteric artery stenosis ([place]) (05/21/2017), Tardive dyskinesia, and Urgency of urination. Past Surgical History Patient  has a past surgical history that includes Knee surgery (Left); 1 baker cyst removed; Dilation and curettage of uterus; Lumbar spine surgery; Abdominal hysterectomy; Lumbar spine surgery; Joint replacement; Blepharoplasty (Bilateral); Eye surgery (Right); EUS (N/A, 07/10/2013); Fine needle aspiration (N/A, 07/10/2013); Back surgery; Breast surgery; Colonoscopy w/ polypectomy; laparoscopy (N/A, 08/07/2013); Whipple procedure (N/A, 08/07/2013); Esophagogastroduodenoscopy (N/A, 09/11/2013); ORIF ankle fracture (Right, 08/16/2015); Radioactive seed guided excisional breast biopsy (Left, 12/15/2015); Breast excisional biopsy; ORIF wrist fracture (Right, 11/01/2016); Hardware Removal (Right, 12/15/2016); Fracture surgery; Esophagogastroduodenoscopy (N/A, 03/21/2019); Flexible sigmoidoscopy (N/A, 03/21/2019); and biopsy (03/21/2019).  Family History: Patient family history includes Alzheimer's disease in her father; Cancer in her mother; Other (age of onset: 3) in her brother. Social History:  Patient  reports that she has never smoked. She has never used smokeless tobacco. She reports that she does not drink alcohol or use drugs.  Review of Systems: Constitutional: negative for fever or malaise, + weak Ophthalmic: negative for photophobia, double vision or loss of vision Cardiovascular: negative for chest pain, dyspnea on exertion, or new LE swelling Respiratory: negative for SOB or persistent cough Gastrointestinal: negative for abdominal pain, + change in bowel habits or melena Genitourinary: negative  for dysuria or gross hematuria Musculoskeletal: +for new gait disturbance or muscular weakness Integumentary: negative for new or persistent rashes Neurological: negative for TIA or stroke symptoms Psychiatric: negative for SI or delusions Allergic/Immunologic: negative for hives  Patient Care Team    Relationship Specialty Notifications Start End  Leamon Arnt, MD PCP - General Family Medicine  04/11/19   Marcial Pacas, MD  Neurology  01/18/13   Ladell Pier, MD Consulting Physician Oncology  09/15/16   Skeet Latch, MD Attending Physician Cardiology  01/05/19   Laurence Spates, MD Consulting Physician Gastroenterology  01/16/19     Objective  Vitals: BP 122/80 (BP Location: Left Arm, Patient Position: Sitting, Cuff Size: Normal)   Pulse 76   Temp (!) 97.1 F (36.2 C) (Temporal)   Ht _0  (1.549 m)   Wt 147 lb (66.7 kg)   SpO2 96%   BMI 27.78 kg/m  General:  Frail appearing, no acute distress  Psych:  Alert and oriented,normal mood and affect HEENT:  Normocephalic, atraumatic, non-icteric sclera, PERRL,  Cardiovascular:  RRR without gallop, rub or murmur, no edema Respiratory:  Good breath sounds bilaterally, CTAB with normal respiratory effort Skin:  Warm, large calluse on feet    Commons side effects, risks, benefits, and alternatives for medications and treatment plan prescribed today were discussed, and the patient expressed understanding of the given instructions. Patient is instructed to call or message via MyChart if he/she has any questions or concerns regarding our treatment plan. No barriers to understanding were identified. We discussed Red Flag symptoms and signs in detail. Patient expressed understanding regarding what to do in case of urgent or emergency type symptoms.   Medication list was reconciled, printed and provided to the patient in AVS. Patient instructions and summary information was reviewed with the patient as documented in the AVS. This note was  prepared with assistance of Dragon voice recognition software. Occasional wrong-word or sound-a-like substitutions may have occurred due to the inherent limitations of voice recognition software  This visit occurred during the SARS-CoV-2 public health emergency.  Safety protocols were in place, including screening questions prior to the visit, additional usage of staff PPE, and extensive cleaning of exam room while observing appropriate contact time as indicated for disinfecting solutions.

## 2019-05-01 NOTE — Patient Instructions (Signed)
Please return in 6 weeks for recheck.   If you have any questions or concerns, please don't hesitate to send me a message via MyChart or call the office at [phone]. Thank you for visiting with [place] today! It's our pleasure caring for you.  I've referred you for a bone density. We will call you to get scheduled.

## 2019-05-02 ENCOUNTER — Telehealth: Payer: Self-pay | Admitting: Neurology

## 2019-05-02 ENCOUNTER — Other Ambulatory Visit: Payer: Self-pay | Admitting: Neurology

## 2019-05-02 DIAGNOSIS — K228 Other specified diseases of esophagus: Secondary | ICD-10-CM | POA: Diagnosis not present

## 2019-05-02 DIAGNOSIS — I341 Nonrheumatic mitral (valve) prolapse: Secondary | ICD-10-CM | POA: Diagnosis not present

## 2019-05-02 DIAGNOSIS — C250 Malignant neoplasm of head of pancreas: Secondary | ICD-10-CM | POA: Diagnosis not present

## 2019-05-02 DIAGNOSIS — D696 Thrombocytopenia, unspecified: Secondary | ICD-10-CM | POA: Diagnosis not present

## 2019-05-02 DIAGNOSIS — R296 Repeated falls: Secondary | ICD-10-CM | POA: Diagnosis not present

## 2019-05-02 DIAGNOSIS — A071 Giardiasis [lambliasis]: Secondary | ICD-10-CM | POA: Diagnosis not present

## 2019-05-02 DIAGNOSIS — B961 Klebsiella pneumoniae [K. pneumoniae] as the cause of diseases classified elsewhere: Secondary | ICD-10-CM | POA: Diagnosis not present

## 2019-05-02 DIAGNOSIS — K219 Gastro-esophageal reflux disease without esophagitis: Secondary | ICD-10-CM | POA: Diagnosis not present

## 2019-05-02 DIAGNOSIS — F0280 Dementia in other diseases classified elsewhere without behavioral disturbance: Secondary | ICD-10-CM | POA: Diagnosis not present

## 2019-05-02 DIAGNOSIS — I10 Essential (primary) hypertension: Secondary | ICD-10-CM | POA: Diagnosis not present

## 2019-05-02 DIAGNOSIS — E039 Hypothyroidism, unspecified: Secondary | ICD-10-CM | POA: Diagnosis not present

## 2019-05-02 DIAGNOSIS — N179 Acute kidney failure, unspecified: Secondary | ICD-10-CM | POA: Diagnosis not present

## 2019-05-02 DIAGNOSIS — F329 Major depressive disorder, single episode, unspecified: Secondary | ICD-10-CM | POA: Diagnosis not present

## 2019-05-02 DIAGNOSIS — G629 Polyneuropathy, unspecified: Secondary | ICD-10-CM | POA: Diagnosis not present

## 2019-05-02 DIAGNOSIS — E876 Hypokalemia: Secondary | ICD-10-CM | POA: Diagnosis not present

## 2019-05-02 DIAGNOSIS — N390 Urinary tract infection, site not specified: Secondary | ICD-10-CM | POA: Diagnosis not present

## 2019-05-02 DIAGNOSIS — G8929 Other chronic pain: Secondary | ICD-10-CM | POA: Diagnosis not present

## 2019-05-02 DIAGNOSIS — L509 Urticaria, unspecified: Secondary | ICD-10-CM | POA: Diagnosis not present

## 2019-05-02 DIAGNOSIS — M8949 Other hypertrophic osteoarthropathy, multiple sites: Secondary | ICD-10-CM | POA: Diagnosis not present

## 2019-05-02 DIAGNOSIS — R42 Dizziness and giddiness: Secondary | ICD-10-CM | POA: Diagnosis not present

## 2019-05-02 DIAGNOSIS — I69344 Monoplegia of lower limb following cerebral infarction affecting left non-dominant side: Secondary | ICD-10-CM | POA: Diagnosis not present

## 2019-05-02 DIAGNOSIS — R410 Disorientation, unspecified: Secondary | ICD-10-CM | POA: Diagnosis not present

## 2019-05-02 DIAGNOSIS — H409 Unspecified glaucoma: Secondary | ICD-10-CM | POA: Diagnosis not present

## 2019-05-02 DIAGNOSIS — E8779 Other fluid overload: Secondary | ICD-10-CM | POA: Diagnosis not present

## 2019-05-02 NOTE — Telephone Encounter (Signed)
Pt's son would like to discuss pt's medications with RN. Please advise.

## 2019-05-02 NOTE — Telephone Encounter (Signed)
Called son, [name] who stated patient was in hospital a month ago for diarrhea, dehydration. The MD lowered lyrica dose, stated 300 mg twice daily "was too much for her". MD decreased to 150 mg twice daily, and her Rx ran out 1 day ago.  The patient and son both feel the lower dose doesn't seem to be working as well, but he also stated they aren't sure lyrica 300 mg was helping neuropathy either. She is also weaker and falls asleep easily in daytime. He would like to know if she should go back to Lyrica 300 mg (they have extras of that dose in caps) or stay on 150 mg bid. If 150 mg bid she needs new Rx sent to [facility] on file. This request will be sent to work in MD.  I advised him she needs FU, last seen Oct 2019. He requested a virtual visit, but no availability until April. He will wait for Jinny Blossom RN to return next Mon to work her in sooner.

## 2019-05-03 DIAGNOSIS — I10 Essential (primary) hypertension: Secondary | ICD-10-CM | POA: Diagnosis not present

## 2019-05-03 DIAGNOSIS — A071 Giardiasis [lambliasis]: Secondary | ICD-10-CM | POA: Diagnosis not present

## 2019-05-03 DIAGNOSIS — R42 Dizziness and giddiness: Secondary | ICD-10-CM | POA: Diagnosis not present

## 2019-05-03 DIAGNOSIS — E039 Hypothyroidism, unspecified: Secondary | ICD-10-CM | POA: Diagnosis not present

## 2019-05-03 DIAGNOSIS — C250 Malignant neoplasm of head of pancreas: Secondary | ICD-10-CM | POA: Diagnosis not present

## 2019-05-03 DIAGNOSIS — R296 Repeated falls: Secondary | ICD-10-CM | POA: Diagnosis not present

## 2019-05-03 NOTE — Telephone Encounter (Signed)
[name] called agaian to received verbal orders. Please advise.

## 2019-05-03 NOTE — Telephone Encounter (Signed)
See note

## 2019-05-03 NOTE — Telephone Encounter (Signed)
Called and left VM giving verbal orders. 

## 2019-05-06 ENCOUNTER — Other Ambulatory Visit: Payer: Self-pay | Admitting: Neurology

## 2019-05-06 MED ORDER — LEVETIRACETAM 250 MG PO TABS: 250.0000 mg | ORAL_TABLET | Freq: Two times a day (BID) | ORAL | 3 refills | Status: AC

## 2019-05-06 MED ORDER — PREGABALIN 150 MG PO CAPS: 150.0000 mg | ORAL_CAPSULE | Freq: Two times a day (BID) | ORAL | 1 refills | Status: AC

## 2019-05-06 NOTE — Addendum Note (Signed)
Addended by: Kathrynn Ducking on: 05/06/2019 12:59 PM   Modules accepted: Orders

## 2019-05-06 NOTE — Telephone Encounter (Signed)
I called the son, the patient was recently hospital with diarrhea, felt to have multifocal colitis from some source.  She has had some drowsiness on the Lyrica, the medication has been cut back to 150 mg twice daily.  The pain remains a significant issue, she will have 1 to 2 days that are relatively good but then cycle back into severe pain.  I will send in a prescription for the 150 Lyrica, we will add Keppra and low-dose taken 250 mg twice daily, they will call for any dose adjustments.

## 2019-05-07 DIAGNOSIS — E039 Hypothyroidism, unspecified: Secondary | ICD-10-CM | POA: Diagnosis not present

## 2019-05-07 DIAGNOSIS — R296 Repeated falls: Secondary | ICD-10-CM | POA: Diagnosis not present

## 2019-05-07 DIAGNOSIS — C250 Malignant neoplasm of head of pancreas: Secondary | ICD-10-CM | POA: Diagnosis not present

## 2019-05-07 DIAGNOSIS — R42 Dizziness and giddiness: Secondary | ICD-10-CM | POA: Diagnosis not present

## 2019-05-07 DIAGNOSIS — A071 Giardiasis [lambliasis]: Secondary | ICD-10-CM | POA: Diagnosis not present

## 2019-05-07 DIAGNOSIS — K529 Noninfective gastroenteritis and colitis, unspecified: Secondary | ICD-10-CM | POA: Diagnosis not present

## 2019-05-07 DIAGNOSIS — K580 Irritable bowel syndrome with diarrhea: Secondary | ICD-10-CM | POA: Diagnosis not present

## 2019-05-07 DIAGNOSIS — I10 Essential (primary) hypertension: Secondary | ICD-10-CM | POA: Diagnosis not present

## 2019-05-07 DIAGNOSIS — G894 Chronic pain syndrome: Secondary | ICD-10-CM | POA: Diagnosis not present

## 2019-05-07 DIAGNOSIS — C259 Malignant neoplasm of pancreas, unspecified: Secondary | ICD-10-CM | POA: Diagnosis not present

## 2019-05-08 DIAGNOSIS — R296 Repeated falls: Secondary | ICD-10-CM | POA: Diagnosis not present

## 2019-05-08 DIAGNOSIS — A071 Giardiasis [lambliasis]: Secondary | ICD-10-CM | POA: Diagnosis not present

## 2019-05-08 DIAGNOSIS — R42 Dizziness and giddiness: Secondary | ICD-10-CM | POA: Diagnosis not present

## 2019-05-08 DIAGNOSIS — E039 Hypothyroidism, unspecified: Secondary | ICD-10-CM | POA: Diagnosis not present

## 2019-05-08 DIAGNOSIS — I10 Essential (primary) hypertension: Secondary | ICD-10-CM | POA: Diagnosis not present

## 2019-05-08 DIAGNOSIS — C250 Malignant neoplasm of head of pancreas: Secondary | ICD-10-CM | POA: Diagnosis not present

## 2019-05-10 DIAGNOSIS — E039 Hypothyroidism, unspecified: Secondary | ICD-10-CM | POA: Diagnosis not present

## 2019-05-10 DIAGNOSIS — R42 Dizziness and giddiness: Secondary | ICD-10-CM | POA: Diagnosis not present

## 2019-05-10 DIAGNOSIS — I10 Essential (primary) hypertension: Secondary | ICD-10-CM | POA: Diagnosis not present

## 2019-05-10 DIAGNOSIS — R296 Repeated falls: Secondary | ICD-10-CM | POA: Diagnosis not present

## 2019-05-10 DIAGNOSIS — A071 Giardiasis [lambliasis]: Secondary | ICD-10-CM | POA: Diagnosis not present

## 2019-05-10 DIAGNOSIS — C250 Malignant neoplasm of head of pancreas: Secondary | ICD-10-CM | POA: Diagnosis not present

## 2019-05-15 DIAGNOSIS — I10 Essential (primary) hypertension: Secondary | ICD-10-CM | POA: Diagnosis not present

## 2019-05-15 DIAGNOSIS — R42 Dizziness and giddiness: Secondary | ICD-10-CM | POA: Diagnosis not present

## 2019-05-15 DIAGNOSIS — A071 Giardiasis [lambliasis]: Secondary | ICD-10-CM | POA: Diagnosis not present

## 2019-05-15 DIAGNOSIS — R296 Repeated falls: Secondary | ICD-10-CM | POA: Diagnosis not present

## 2019-05-15 DIAGNOSIS — C250 Malignant neoplasm of head of pancreas: Secondary | ICD-10-CM | POA: Diagnosis not present

## 2019-05-15 DIAGNOSIS — E039 Hypothyroidism, unspecified: Secondary | ICD-10-CM | POA: Diagnosis not present

## 2019-05-16 DIAGNOSIS — R42 Dizziness and giddiness: Secondary | ICD-10-CM | POA: Diagnosis not present

## 2019-05-16 DIAGNOSIS — R296 Repeated falls: Secondary | ICD-10-CM | POA: Diagnosis not present

## 2019-05-16 DIAGNOSIS — C250 Malignant neoplasm of head of pancreas: Secondary | ICD-10-CM | POA: Diagnosis not present

## 2019-05-16 DIAGNOSIS — I10 Essential (primary) hypertension: Secondary | ICD-10-CM | POA: Diagnosis not present

## 2019-05-16 DIAGNOSIS — A071 Giardiasis [lambliasis]: Secondary | ICD-10-CM | POA: Diagnosis not present

## 2019-05-16 DIAGNOSIS — E039 Hypothyroidism, unspecified: Secondary | ICD-10-CM | POA: Diagnosis not present

## 2019-05-20 ENCOUNTER — Other Ambulatory Visit: Payer: Self-pay | Admitting: Cardiovascular Disease

## 2019-05-22 ENCOUNTER — Encounter: Payer: Self-pay | Admitting: Family Medicine

## 2019-05-22 ENCOUNTER — Other Ambulatory Visit: Payer: Self-pay | Admitting: Nurse Practitioner

## 2019-05-22 ENCOUNTER — Other Ambulatory Visit: Payer: Self-pay

## 2019-05-22 DIAGNOSIS — C250 Malignant neoplasm of head of pancreas: Secondary | ICD-10-CM

## 2019-05-22 MED ORDER — HYDROCODONE-ACETAMINOPHEN 5-325 MG PO TABS: 1.0000 | ORAL_TABLET | Freq: Four times a day (QID) | ORAL | 0 refills | Status: DC | PRN

## 2019-05-22 MED ORDER — LOVASTATIN 40 MG PO TABS: ORAL_TABLET | ORAL | 3 refills | Status: DC

## 2019-05-27 ENCOUNTER — Inpatient Hospital Stay: Payer: Medicare Other | Attending: Oncology | Admitting: Oncology

## 2019-05-27 ENCOUNTER — Other Ambulatory Visit: Payer: Self-pay | Admitting: Family Medicine

## 2019-05-27 ENCOUNTER — Other Ambulatory Visit: Payer: Self-pay

## 2019-05-27 DIAGNOSIS — E039 Hypothyroidism, unspecified: Secondary | ICD-10-CM

## 2019-05-27 DIAGNOSIS — C250 Malignant neoplasm of head of pancreas: Secondary | ICD-10-CM

## 2019-05-27 MED ORDER — LEVOTHYROXINE SODIUM 175 MCG PO TABS: ORAL_TABLET | ORAL | 3 refills | Status: AC

## 2019-05-27 NOTE — Progress Notes (Signed)
[facility] OFFICE VISIT PROGRESS NOTE  I connected with [name] on 05/27/19 at 11:45 AM EST by telephone and verified that I am speaking with the correct person using two identifiers.   I discussed the limitations, risks, security and privacy concerns of performing an evaluation and management service by telemedicine and the availability of in-person appointments. I also discussed with the patient that there may be a patient responsible charge related to this service. The patient expressed understanding and agreed to proceed.  Other persons participating in the visit and their role in the encounter: Son  Patient's location: Home Provider's location: Office    Diagnosis: Pancreas cancer  INTERVAL HISTORY:   [name] is seen today for a telehealth visit.  This is secondary to the Covid pandemic.  She reports the stool frequency varies from 3-6 times per day.  She has no diarrhea on some days..  She is taking Imodium, Lomotil, and cholestyramine. She has stable back pain.  She has increased pain in the low abdomen.  She had blood in the stool today.  She relates this to hemorrhoids. Ms. Batch has a good appetite.  Lab Results:  Lab Results  Component Value Date   WBC 5.8 04/11/2019   HGB 11.4 (L) 04/11/2019   HCT 36.2 04/11/2019   MCV 104.3 (H) 04/11/2019   PLT 211 04/11/2019   NEUTROABS 10.2 (H) 03/26/2019    Medications: I have reviewed the patient's current medications.  Assessment/Plan: 1.  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  CT abdomen/pelvis 01/19/2015 with no evidence  of recurrent/metastatic disease.  CT abdomen/pelvis 07/20/2018-infiltrating soft tissue at the pancreas resection bed with involvement of the SMA, occlusion of the SMV with collaterals. New L2 compression fracture  SBRT beginning 08/21/2018, completed 08/31/2018, 5 fractions  CT 12/03/2018-no change in abnormal soft tissue encasing the SMA  CT 03/27/2019-new wall thickening and mucosal enhancement in the sigmoid and rectum, hepatic flexure wall thickening has resolved new or progressive ascending colonic wall thickening.  Persistent soft tissue surrounding the SMA-stable, no ascites or evidence of peritoneal metastases, enlarging ventral hernias  2. Nausea following the Whipple procedure-improved 3. Mild thrombocytopenia , Chronic 4.Pain secondary to local recurrence of pancreas cancer 5.  Diarrhea-etiology unclear, Giardia antigen positive 03/21/2019, treated with 10 it is all     Disposition: [name] has a history of locally recurrent pancreas cancer.  She completed palliative radiation in April.  There is no clinical or radiologic evidence of disease progression.  She has persistent diarrhea.  She is being followed by Dr. Alessandra Bevels.  I recommend she continue follow-up with Dr. Rosalie Gums for evaluation/management of diarrhea and rectal bleeding.  The low abdominal pain may be related to the colitis.  She will call for increased pain or new symptoms.  She will be scheduled for an in person visit in 6-8 weeks.   I discussed the assessment and treatment plan with the patient. The patient was provided an opportunity to ask questions and all were answered. The patient agreed with the plan and demonstrated an understanding of the instructions.   The patient was advised to call back or seek an in-person evaluation if the symptoms worsen or if the  condition fails to improve as anticipated.  I provided 15 minutes of telephone, chart review, and documentation time during this encounter, and  > 50% was spent counseling as documented under my assessment & plan.  Betsy Coder ANP/GNP-BC   05/27/2019 11:42 AM

## 2019-05-29 ENCOUNTER — Telehealth: Payer: Self-pay | Admitting: Family Medicine

## 2019-05-29 ENCOUNTER — Telehealth: Payer: Self-pay | Admitting: Oncology

## 2019-05-29 DIAGNOSIS — C250 Malignant neoplasm of head of pancreas: Secondary | ICD-10-CM | POA: Diagnosis not present

## 2019-05-29 DIAGNOSIS — E039 Hypothyroidism, unspecified: Secondary | ICD-10-CM | POA: Diagnosis not present

## 2019-05-29 DIAGNOSIS — I10 Essential (primary) hypertension: Secondary | ICD-10-CM | POA: Diagnosis not present

## 2019-05-29 DIAGNOSIS — R296 Repeated falls: Secondary | ICD-10-CM | POA: Diagnosis not present

## 2019-05-29 DIAGNOSIS — A071 Giardiasis [lambliasis]: Secondary | ICD-10-CM | POA: Diagnosis not present

## 2019-05-29 DIAGNOSIS — R42 Dizziness and giddiness: Secondary | ICD-10-CM | POA: Diagnosis not present

## 2019-05-29 NOTE — Telephone Encounter (Signed)
Scheduled per los. Called and left msg. Mailed printout  °

## 2019-05-29 NOTE — Telephone Encounter (Signed)
See note

## 2019-05-29 NOTE — Telephone Encounter (Signed)
Pt should call GI doctor for advice and follow up.   May get a BMP.  Thanks.

## 2019-05-29 NOTE — Telephone Encounter (Signed)
Returned Beth's call to give VO. Beth states that patient is really weak and patient is having diarrhea and is going to the bathroom at least 5-7 times a day when she is there. Beth wants to know if it would be ok for her to get labs CBC, lipid, etc on her?  I did advise her that the best thing to do was to go to the ED to rule out any dehydration and to get IV fluids, but she states they don't want to stay out of the ED.  Please advise.

## 2019-05-29 NOTE — Telephone Encounter (Signed)
Home Health Verbal Orders - Caller/Agency: [facility] Number: [phone] Requesting OT/PT/Skilled Nursing/Social Work/Speech Therapy: skilled nursing re-certification  Frequency: recertify for 60 days, 1x weekly till improvement, then every other week

## 2019-06-01 DIAGNOSIS — H6990 Unspecified Eustachian tube disorder, unspecified ear: Secondary | ICD-10-CM | POA: Diagnosis not present

## 2019-06-01 DIAGNOSIS — E876 Hypokalemia: Secondary | ICD-10-CM | POA: Diagnosis not present

## 2019-06-01 DIAGNOSIS — L509 Urticaria, unspecified: Secondary | ICD-10-CM | POA: Diagnosis not present

## 2019-06-01 DIAGNOSIS — N390 Urinary tract infection, site not specified: Secondary | ICD-10-CM | POA: Diagnosis not present

## 2019-06-01 DIAGNOSIS — E8779 Other fluid overload: Secondary | ICD-10-CM | POA: Diagnosis not present

## 2019-06-01 DIAGNOSIS — M8949 Other hypertrophic osteoarthropathy, multiple sites: Secondary | ICD-10-CM | POA: Diagnosis not present

## 2019-06-01 DIAGNOSIS — C250 Malignant neoplasm of head of pancreas: Secondary | ICD-10-CM | POA: Diagnosis not present

## 2019-06-01 DIAGNOSIS — I10 Essential (primary) hypertension: Secondary | ICD-10-CM | POA: Diagnosis not present

## 2019-06-01 DIAGNOSIS — E039 Hypothyroidism, unspecified: Secondary | ICD-10-CM | POA: Diagnosis not present

## 2019-06-01 DIAGNOSIS — D696 Thrombocytopenia, unspecified: Secondary | ICD-10-CM | POA: Diagnosis not present

## 2019-06-01 DIAGNOSIS — I341 Nonrheumatic mitral (valve) prolapse: Secondary | ICD-10-CM | POA: Diagnosis not present

## 2019-06-01 DIAGNOSIS — B961 Klebsiella pneumoniae [K. pneumoniae] as the cause of diseases classified elsewhere: Secondary | ICD-10-CM | POA: Diagnosis not present

## 2019-06-01 DIAGNOSIS — G8929 Other chronic pain: Secondary | ICD-10-CM | POA: Diagnosis not present

## 2019-06-01 DIAGNOSIS — K529 Noninfective gastroenteritis and colitis, unspecified: Secondary | ICD-10-CM | POA: Diagnosis not present

## 2019-06-01 DIAGNOSIS — H409 Unspecified glaucoma: Secondary | ICD-10-CM | POA: Diagnosis not present

## 2019-06-01 DIAGNOSIS — A071 Giardiasis [lambliasis]: Secondary | ICD-10-CM | POA: Diagnosis not present

## 2019-06-01 DIAGNOSIS — N179 Acute kidney failure, unspecified: Secondary | ICD-10-CM | POA: Diagnosis not present

## 2019-06-01 DIAGNOSIS — K228 Other specified diseases of esophagus: Secondary | ICD-10-CM | POA: Diagnosis not present

## 2019-06-01 DIAGNOSIS — I69344 Monoplegia of lower limb following cerebral infarction affecting left non-dominant side: Secondary | ICD-10-CM | POA: Diagnosis not present

## 2019-06-01 DIAGNOSIS — E785 Hyperlipidemia, unspecified: Secondary | ICD-10-CM | POA: Diagnosis not present

## 2019-06-01 DIAGNOSIS — G629 Polyneuropathy, unspecified: Secondary | ICD-10-CM | POA: Diagnosis not present

## 2019-06-01 DIAGNOSIS — E538 Deficiency of other specified B group vitamins: Secondary | ICD-10-CM | POA: Diagnosis not present

## 2019-06-01 DIAGNOSIS — K219 Gastro-esophageal reflux disease without esophagitis: Secondary | ICD-10-CM | POA: Diagnosis not present

## 2019-06-01 DIAGNOSIS — F0280 Dementia in other diseases classified elsewhere without behavioral disturbance: Secondary | ICD-10-CM | POA: Diagnosis not present

## 2019-06-04 ENCOUNTER — Other Ambulatory Visit: Payer: Self-pay | Admitting: Nurse Practitioner

## 2019-06-04 DIAGNOSIS — C250 Malignant neoplasm of head of pancreas: Secondary | ICD-10-CM

## 2019-06-04 NOTE — Telephone Encounter (Signed)
Spoke with Eustaquio Maize and told her labs are ok per Dr. Jonni Sanger. Beth states that if her GI doctor already had drawn labs then she won't need to.

## 2019-06-04 NOTE — Telephone Encounter (Signed)
Ok for bmp and cbc. Thanks. Please also send the lab reports to her GI doctor.

## 2019-06-04 NOTE — Telephone Encounter (Signed)
Spoke with Beth from [employer]. Per Eustaquio Maize, patient is seeing an GI specialist and patient is having black stools/bloody which is a new problem. She wants to know if she can also get a CBC as well? Eustaquio Maize will be seeing patient tomorrow and will need VO. Please advise

## 2019-06-05 DIAGNOSIS — C259 Malignant neoplasm of pancreas, unspecified: Secondary | ICD-10-CM | POA: Diagnosis not present

## 2019-06-05 DIAGNOSIS — K625 Hemorrhage of anus and rectum: Secondary | ICD-10-CM | POA: Diagnosis not present

## 2019-06-05 DIAGNOSIS — D649 Anemia, unspecified: Secondary | ICD-10-CM | POA: Diagnosis not present

## 2019-06-05 DIAGNOSIS — K921 Melena: Secondary | ICD-10-CM | POA: Diagnosis not present

## 2019-06-05 DIAGNOSIS — R1032 Left lower quadrant pain: Secondary | ICD-10-CM | POA: Diagnosis not present

## 2019-06-05 DIAGNOSIS — K529 Noninfective gastroenteritis and colitis, unspecified: Secondary | ICD-10-CM | POA: Diagnosis not present

## 2019-06-05 DIAGNOSIS — Z9049 Acquired absence of other specified parts of digestive tract: Secondary | ICD-10-CM | POA: Diagnosis not present

## 2019-06-05 LAB — BASIC METABOLIC PANEL
BUN: 18 (ref 4–21)
Chloride: 105 (ref 99–108)
Creatinine: 0.8 (ref 0.5–1.1)
Glucose: 103
Potassium: 4.2 (ref 3.4–5.3)
Sodium: 138 (ref 137–147)

## 2019-06-05 LAB — HEPATIC FUNCTION PANEL
ALT: 17 (ref 7–35)
AST: 19 (ref 13–35)

## 2019-06-05 LAB — IRON,TIBC AND FERRITIN PANEL
%SAT: 44
Ferritin: 14.2
Iron: 160

## 2019-06-05 LAB — CBC: RBC: 2.68 — AB (ref 3.87–5.11)

## 2019-06-05 LAB — COMPREHENSIVE METABOLIC PANEL
Albumin: 3.4 — AB (ref 3.5–5.0)
Calcium: 8.5 — AB (ref 8.7–10.7)

## 2019-06-05 LAB — CBC AND DIFFERENTIAL
HCT: 24 — AB (ref 36–46)
Hemoglobin: 7.8 — AB (ref 12.0–16.0)
Platelets: 137 — AB (ref 150–399)
WBC: 4.9

## 2019-06-06 ENCOUNTER — Other Ambulatory Visit: Payer: Self-pay | Admitting: Gastroenterology

## 2019-06-06 DIAGNOSIS — E538 Deficiency of other specified B group vitamins: Secondary | ICD-10-CM | POA: Diagnosis not present

## 2019-06-06 DIAGNOSIS — A071 Giardiasis [lambliasis]: Secondary | ICD-10-CM | POA: Diagnosis not present

## 2019-06-06 DIAGNOSIS — K529 Noninfective gastroenteritis and colitis, unspecified: Secondary | ICD-10-CM | POA: Diagnosis not present

## 2019-06-06 DIAGNOSIS — R1032 Left lower quadrant pain: Secondary | ICD-10-CM

## 2019-06-06 DIAGNOSIS — I10 Essential (primary) hypertension: Secondary | ICD-10-CM | POA: Diagnosis not present

## 2019-06-06 DIAGNOSIS — E039 Hypothyroidism, unspecified: Secondary | ICD-10-CM | POA: Diagnosis not present

## 2019-06-06 DIAGNOSIS — C250 Malignant neoplasm of head of pancreas: Secondary | ICD-10-CM | POA: Diagnosis not present

## 2019-06-07 ENCOUNTER — Other Ambulatory Visit: Payer: Self-pay | Admitting: Pulmonary Disease

## 2019-06-07 ENCOUNTER — Other Ambulatory Visit: Payer: Self-pay | Admitting: Gastroenterology

## 2019-06-07 DIAGNOSIS — R109 Unspecified abdominal pain: Secondary | ICD-10-CM

## 2019-06-07 DIAGNOSIS — R1032 Left lower quadrant pain: Secondary | ICD-10-CM

## 2019-06-11 ENCOUNTER — Telehealth: Payer: Self-pay | Admitting: Family Medicine

## 2019-06-11 ENCOUNTER — Other Ambulatory Visit: Payer: Self-pay

## 2019-06-11 ENCOUNTER — Ambulatory Visit (HOSPITAL_COMMUNITY)
Admission: RE | Admit: 2019-06-11 | Discharge: 2019-06-11 | Disposition: A | Discharge status: Home / Self Care | Payer: Medicare Other | Source: Ambulatory Visit | Attending: Gastroenterology | Admitting: Gastroenterology

## 2019-06-11 DIAGNOSIS — R109 Unspecified abdominal pain: Secondary | ICD-10-CM | POA: Diagnosis not present

## 2019-06-11 DIAGNOSIS — R1032 Left lower quadrant pain: Secondary | ICD-10-CM

## 2019-06-11 DIAGNOSIS — K529 Noninfective gastroenteritis and colitis, unspecified: Secondary | ICD-10-CM | POA: Diagnosis not present

## 2019-06-11 MED ORDER — IOHEXOL 300 MG/ML  SOLN
100.0000 mL | Freq: Once | INTRAMUSCULAR | Status: AC | PRN
  Administered 2019-06-11: 18:00:00 100 mL via INTRAVENOUS

## 2019-06-11 NOTE — Telephone Encounter (Signed)
Please review her chart and clarify for nurse. There are notes asking for cbc and bmp. These orders were signed. If she had them drawn, then she is good. If she still needs them, then yes

## 2019-06-11 NOTE — Telephone Encounter (Signed)
Beth from [facility] called in wondering if the patient needs lab work when she visits her tomorrow, she swaid she spoke with patient she had las drawn from her GI doctor at the last visit but when she received the appt notes that the patient was going to have lab work done at next visit.

## 2019-06-11 NOTE — Telephone Encounter (Signed)
Please advise 

## 2019-06-12 ENCOUNTER — Encounter: Payer: Self-pay | Admitting: Oncology

## 2019-06-12 DIAGNOSIS — I10 Essential (primary) hypertension: Secondary | ICD-10-CM | POA: Diagnosis not present

## 2019-06-12 DIAGNOSIS — E538 Deficiency of other specified B group vitamins: Secondary | ICD-10-CM | POA: Diagnosis not present

## 2019-06-12 DIAGNOSIS — K529 Noninfective gastroenteritis and colitis, unspecified: Secondary | ICD-10-CM | POA: Diagnosis not present

## 2019-06-12 DIAGNOSIS — C250 Malignant neoplasm of head of pancreas: Secondary | ICD-10-CM | POA: Diagnosis not present

## 2019-06-12 DIAGNOSIS — A071 Giardiasis [lambliasis]: Secondary | ICD-10-CM | POA: Diagnosis not present

## 2019-06-12 DIAGNOSIS — E039 Hypothyroidism, unspecified: Secondary | ICD-10-CM | POA: Diagnosis not present

## 2019-06-12 NOTE — Telephone Encounter (Signed)
Spoke with [employer]. Beth states patient already had labs drawn at her GI specialist.

## 2019-06-13 ENCOUNTER — Other Ambulatory Visit: Payer: Self-pay

## 2019-06-14 ENCOUNTER — Encounter: Payer: Self-pay | Admitting: Family Medicine

## 2019-06-14 ENCOUNTER — Ambulatory Visit (INDEPENDENT_AMBULATORY_CARE_PROVIDER_SITE_OTHER): Payer: Medicare Other | Admitting: Family Medicine

## 2019-06-14 VITALS — BP 114/69 | HR 55 | Temp 97.9°F | Ht 61.0 in | Wt 139.0 lb

## 2019-06-14 DIAGNOSIS — K551 Chronic vascular disorders of intestine: Secondary | ICD-10-CM | POA: Diagnosis not present

## 2019-06-14 DIAGNOSIS — D649 Anemia, unspecified: Secondary | ICD-10-CM | POA: Diagnosis not present

## 2019-06-14 DIAGNOSIS — K8681 Exocrine pancreatic insufficiency: Secondary | ICD-10-CM

## 2019-06-14 DIAGNOSIS — K529 Noninfective gastroenteritis and colitis, unspecified: Secondary | ICD-10-CM

## 2019-06-14 DIAGNOSIS — C250 Malignant neoplasm of head of pancreas: Secondary | ICD-10-CM | POA: Diagnosis not present

## 2019-06-14 DIAGNOSIS — I10 Essential (primary) hypertension: Secondary | ICD-10-CM

## 2019-06-14 MED ORDER — DULOXETINE HCL 60 MG PO CPEP: 60.0000 mg | ORAL_CAPSULE | Freq: Every day | ORAL | 0 refills | Status: AC

## 2019-06-14 MED ORDER — ATENOLOL 50 MG PO TABS: 25.0000 mg | ORAL_TABLET | Freq: Two times a day (BID) | ORAL | 2 refills | Status: DC

## 2019-06-14 NOTE — Progress Notes (Signed)
Subjective  CC:  Chief Complaint  Patient presents with  . Diarrhea  . Blood In Stools    HPI: [name] is a 79 y.o. female who presents to the office today to address the problems listed above in the chief complaint.  F/u for multiple medical problems. Reviewed GI's office visits and recent CT scan findings: severe recurrent colitis on CT scan with recurrent mass encasing the superior mesenteric artery:  "Complete encasement of the proximal superior mesenteric artery and the superior mesenteric vein (superior mesenteric vein is completely occluded) related to abnormal soft tissue mass (discussed below) which also severely narrows the distal splenic vein and splenoportal confluence. Other than the soft tissue mass (discussed below) there is no lymphadenopathy confidently identified in the abdomen or Pelvis." This could be causing the recurrent colitis: "ischemic like colitis". We discussed this. Pt is getting oncology appt to discuss further options. GI then will decide on next course, +/- colonscopy.   Labs reviewed and hgb 7.7 down from 11.4 in November.   Pt c/o fatigue, nausea and abdominal pain with intemittent severe diarrhea.   Some lightheadedness; now only on atenolol 50bid (held ace, hctz and amlodipine).   Lives alone [facility] comes weekly.   Reviewed med list again: given her current unwell state, want to decrease pill burden.   Assessment  1. Chronic nonspecific colitis   2. Essential hypertension   3. Exocrine pancreatic insufficiency, Rx Creon   4. Anemia, unspecified type   5. Chronic diarrhea, intermittent   6. 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 Ischemic like. To see onc and GI. Continue antidiarrheals although not completely helpful. WBC and CMP were unremarkable. ([facility] from [facility])  HTN: with low bps on bb w/ low heart rate.  Decrease dose of atenolol. May use hctz prn for leg edema  H/o pancreatic cancer: Dr. Ammie Dalton to reevaluate  Nausea and abdominal pain: hold statin and decrease cymbalta dosing.   Discussed fall precautions.   Time spent on the day of this office visit was 45 minutes with review of records, labs, examining patient and working on complicated care plan.   Follow up: virtual visit in 4-6 weeks for recheck.   Visit date not found  No orders of the defined types were placed in this encounter.  Meds ordered this encounter  Medications  . atenolol (TENORMIN) 50 MG tablet    Sig: Take 0.5 tablets (25 mg total) by mouth 2 (two) times daily.    Dispense:  180 tablet    Refill:  2  . DULoxetine (CYMBALTA) 60 MG capsule    Sig: Take 1 capsule (60 mg total) by mouth daily.    Dispense:  180 capsule    Refill:  0    Pt needs an appointment for further refills.      I reviewed the patients updated PMH, FH, and SocHx.    Patient Active Problem List   Diagnosis Date Noted  . Diarrhea 03/20/2019  . Acute colitis 03/20/2019  . Acute renal failure (ARF) ([place]) 03/20/2019  . Aortic atherosclerosis ([place]) 01/05/2019  . 3-vessel CAD, on CT chest 01/05/2019  . Primary open angle glaucoma (POAG) of both eyes, moderate stage 04/18/2018  . Pseudophakia, right eye 04/18/2018  . History of lumbar fusion 11/01/2017  . Tardive dyskinesia, vitamin E   . History of  stroke   . Mitral valve prolapse   . Chronic maxillary sinusitis, using NetiPot, flonase   . Superior mesenteric artery stenosis ([place]) 05/21/2017  . Chronic left sacral fracture with mild edema, MRI 07/2017 01/07/2017  . Thrombocytopenia ([place]), mild, chronic 01/07/2017  . HLD (hyperlipidemia), on Lovastatin 07/28/2015  . Fatty liver, on CT 09/09/14 09/09/2014  . Exocrine pancreatic insufficiency, Rx Creon 12/30/2013  . Carcinoma of head of pancreas [facility]), followed by Dr [name], Oncology 07/01/2013  . Peripheral neuropathy, Lyrica 400 mg  BID 08/30/2012  . Nuclear sclerosis, left 04/13/2011  . 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 Colon polyps 06/05/2008  . Osteoarthritis, mulitple sites 06/04/2008  . Chronic diarrhea, intermittent 04/22/2008  . Hypothyroidism, on Levothryoxine 02/26/2007  . Adjustment disorder with mixed anxiety and depressed mood, on Cymbalta and Buspar 02/26/2007  . Essential hypertension 02/26/2007  . GERD, on Prilosec 02/26/2007  . Osteoporosis, on Calcium and Vitamin D 02/26/2007   Current Meds  Medication Sig  . Ascorbic Acid (VITAMIN C) 1000 MG tablet Take 1,000 mg by mouth daily.  Marland Kitchen aspirin EC 81 MG tablet Take 81 mg by mouth daily.  Marland Kitchen atenolol (TENORMIN) 50 MG tablet Take 0.5 tablets (25 mg total) by mouth 2 (two) times daily.  . betamethasone dipropionate 0.05 % lotion Apply 1 application topically daily as needed (itching).   . busPIRone (BUSPAR) 5 MG tablet Take 1 tablet by mouth twice daily  . Calcium Carb-Cholecalciferol (CALCIUM 600+D) 600-800 MG-UNIT TABS Take 1 tablet by mouth daily.  . carboxymethylcellulose (REFRESH TEARS) 0.5 % SOLN Place 1 drop into both eyes 2 (two) times daily.   . cholecalciferol (VITAMIN D) 1000 units tablet Take 1,000 Units by mouth 2 (two) times daily.  . Coenzyme Q10 300 MG CAPS Take 300 mg by mouth daily.  . diphenoxylate-atropine (LOMOTIL) 2.5-0.025 MG tablet Take 1-2 tablets by mouth 4 (four) times daily as needed for diarrhea or loose stools.  . DULoxetine (CYMBALTA) 60 MG capsule Take 1 capsule (60 mg total) by mouth daily.  Marland Kitchen HYDROcodone-acetaminophen (NORCO/VICODIN) 5-325 MG tablet Take 1 tablet by mouth every 6 (six) hours as needed for moderate pain.  Marland Kitchen latanoprost (XALATAN) 0.005 % ophthalmic solution Place 1 drop into both eyes at bedtime.   . levETIRAcetam (KEPPRA) 250 MG tablet Take 1 tablet (250 mg total) by mouth 2 (two) times daily.  Marland Kitchen levothyroxine (SYNTHROID) 175 MCG tablet TAKE 1 TABLET BY MOUTH  DAILY BEFORE BREAKFAST  . loperamide (IMODIUM  A-D) 2 MG tablet take 1 tablet by mouth as needed for diarrhea or loose stools *maximum of 8 tablets per day*  . loratadine (CLARITIN) 10 MG tablet Take 10 mg by mouth daily.  Marland Kitchen lovastatin (MEVACOR) 40 MG tablet TAKE 1 TABLET BY MOUTH EVERYDAY AT BEDTIME  . omeprazole (PRILOSEC) 20 MG capsule TAKE 1 CAPSULE BY MOUTH EVERY DAY  . ondansetron (ZOFRAN) 8 MG tablet Take 1 tablet (8 mg total) by mouth every 8 (eight) hours. (Patient taking differently: Take 8 mg by mouth every 8 (eight) hours as needed for nausea or vomiting. )  . Pancrelipase, Lip-Prot-Amyl, (CREON) 24000-76000 units CPEP Take 2 capsules by mouth 3 (three) times daily with meals.   . pregabalin (LYRICA) 150 MG capsule Take 1 capsule (150 mg total) by mouth 2 (two) times daily.  . psyllium (REGULOID) 0.52 g capsule Take 1.04 g by mouth daily.   Marland Kitchen rOPINIRole (REQUIP) 0.5 MG tablet TAKE 1 TABLET BY MOUTH AT BEDTIME  . sennosides-docusate sodium (SENOKOT-S) 8.6-50  MG tablet Take 1 tablet by mouth daily.   . simethicone (MYLICON) 0000000 MG chewable tablet Chew 125 mg by mouth 3 (three) times daily.  Marland Kitchen tolterodine (DETROL LA) 4 MG 24 hr capsule Take 4 mg by mouth daily.   Marland Kitchen triamcinolone ointment (KENALOG) 0.5 % Apply 1 application topically 2 (two) times daily. (Patient taking differently: Apply 1 application topically 2 (two) times daily as needed (irritation). )  . [DISCONTINUED] atenolol (TENORMIN) 50 MG tablet TAKE 1 TABLET BY MOUTH TWICE A DAY  . [DISCONTINUED] DULoxetine (CYMBALTA) 60 MG capsule TAKE 1 CAPSULE (60 MG TOTAL) BY MOUTH 2 (TWO) TIMES DAILY.    Allergies: Patient is allergic to carafate [sucralfate]; metoclopramide hcl; other; niacin; trovan [alatrofloxacin mesylate]; benzocaine-resorcinol; celecoxib; erythromycin base; glucosamine; nortriptyline; phenazopyridine hcl; sulfa antibiotics; and sulfonamide derivatives. Family History: Patient family history includes Alzheimer's disease in her father; Cancer in her mother; Other  (age of onset: 52) in her brother. Social History:  Patient  reports that she has never smoked. She has never used smokeless tobacco. She reports that she does not drink alcohol or use drugs.  Review of Systems: Constitutional: Negative for fever Cardiovascular: negative for chest pain Respiratory: negative for SOB or persistent cough   Objective  Vitals: BP 114/69 (BP Location: Right Arm, Patient Position: Sitting, Cuff Size: Normal)   Pulse (!) 55   Temp 97.9 F (36.6 C) (Temporal)   Ht 5\' 1"  (1.549 m)   Wt 139 lb (63 kg)   SpO2 92%   BMI 26.26 kg/m  General: uncomfortable appearing. Had to use the bathroom twice during our visit, A&Ox3 Cardiovascular:  RRR without murmur or gallop.  Respiratory:  Good breath sounds bilaterally, CTAB with normal respiratory effort Abdomen is soft w/ minimal ttp Skin:  Warm, no rashes     Commons side effects, risks, benefits, and alternatives for medications and treatment plan prescribed today were discussed, and the patient expressed understanding of the given instructions. Patient is instructed to call or message via MyChart if he/she has any questions or concerns regarding our treatment plan. No barriers to understanding were identified. We discussed Red Flag symptoms and signs in detail. Patient expressed understanding regarding what to do in case of urgent or emergency type symptoms.   Medication list was reconciled, printed and provided to the patient in AVS. Patient instructions and summary information was reviewed with the patient as documented in the AVS. This note was prepared with assistance of Dragon voice recognition software. Occasional wrong-word or sound-a-like substitutions may have occurred due to the inherent limitations of voice recognition software  This visit occurred during the SARS-CoV-2 public health emergency.  Safety protocols were in place, including screening questions prior to the visit, additional usage of staff PPE,  and extensive cleaning of exam room while observing appropriate contact time as indicated for disinfecting solutions.

## 2019-06-14 NOTE — Patient Instructions (Addendum)
Please schedule a follow up virtual visit in 4-6 weeks to follow up on blood pressure, oncology and GI recommendations.   Please stop the lovastatin for now.  You may use HCTZ 12.5 periodically for leg swelling IF BP is normal.  Please decrease the atenolol to 1/2 tablet twice a day.  IF your blood pressure is consistently < 120/70, then stop the atenolol.  Use your pain medications when you are hurting.  Please decrease the cymbalta to 60mg  daily.  Please have a TSH added to your next blood draw to ensure your thyroid is stable on the current dose of levothyroxine.   If you have any questions or concerns, please don't hesitate to send me a message via MyChart or call the office at [phone]. Thank you for visiting with [place] today! It's our pleasure caring for you.

## 2019-06-18 ENCOUNTER — Other Ambulatory Visit: Payer: Medicare Other

## 2019-06-19 ENCOUNTER — Inpatient Hospital Stay: Payer: Medicare Other | Attending: Oncology | Admitting: Oncology

## 2019-06-19 DIAGNOSIS — A071 Giardiasis [lambliasis]: Secondary | ICD-10-CM | POA: Diagnosis not present

## 2019-06-19 DIAGNOSIS — R748 Abnormal levels of other serum enzymes: Secondary | ICD-10-CM | POA: Diagnosis not present

## 2019-06-19 DIAGNOSIS — R7989 Other specified abnormal findings of blood chemistry: Secondary | ICD-10-CM | POA: Diagnosis not present

## 2019-06-19 DIAGNOSIS — B351 Tinea unguium: Secondary | ICD-10-CM | POA: Diagnosis not present

## 2019-06-19 DIAGNOSIS — K529 Noninfective gastroenteritis and colitis, unspecified: Secondary | ICD-10-CM | POA: Diagnosis not present

## 2019-06-19 DIAGNOSIS — M79675 Pain in left toe(s): Secondary | ICD-10-CM | POA: Diagnosis not present

## 2019-06-19 DIAGNOSIS — I10 Essential (primary) hypertension: Secondary | ICD-10-CM | POA: Diagnosis not present

## 2019-06-19 DIAGNOSIS — C250 Malignant neoplasm of head of pancreas: Secondary | ICD-10-CM

## 2019-06-19 DIAGNOSIS — M79674 Pain in right toe(s): Secondary | ICD-10-CM | POA: Diagnosis not present

## 2019-06-19 DIAGNOSIS — E039 Hypothyroidism, unspecified: Secondary | ICD-10-CM | POA: Diagnosis not present

## 2019-06-19 DIAGNOSIS — L03032 Cellulitis of left toe: Secondary | ICD-10-CM | POA: Diagnosis not present

## 2019-06-19 DIAGNOSIS — E538 Deficiency of other specified B group vitamins: Secondary | ICD-10-CM | POA: Diagnosis not present

## 2019-06-19 DIAGNOSIS — L600 Ingrowing nail: Secondary | ICD-10-CM | POA: Diagnosis not present

## 2019-06-19 MED ORDER — OPIUM 10 MG/ML (1%) PO TINC: 0.6000 mL | Freq: Four times a day (QID) | ORAL | 0 refills | Status: DC | PRN

## 2019-06-19 NOTE — Progress Notes (Signed)
[name] OFFICE VISIT PROGRESS NOTE  I connected with [name] with on 06/19/19 at  9:45 AM EST by  and verified that I am speaking with the correct person using two identifiers.   I discussed the limitations, risks, security and privacy concerns of performing an evaluation and management service by telemedicine and the availability of in-person appointments. I also discussed with the patient that there may be a patient responsible charge related to this service. The patient expressed understanding and agreed to proceed.  Other persons participating in the visit and their role in the encounter: Son  Patient's location: Home Provider's location: Office   Diagnosis: Pancreas cancer  INTERVAL HISTORY:   [name] is seen today for a video telehealth visit.  This is secondary to the Covid pandemic.  Her son is present for today's visit. [name] continues to have diarrhea.  She reports 2-7 loose stools per day.  Imodium and Lomotil have not helped.  She is taking Creon.  She has intermittent bright red blood mixed in with stool.  Her son reports the hemoglobin was low when she recently saw Dr. Alessandra Bevels.  A CT of the abdomen pelvis on 06/11/2019 revealed evidence of ascending colitis.  Soft tissue encasing the SMA and SMV has enlarged.  The SMV appears occluded.  The small volume of ascites.  There is diverticulosis without evidence of reticulitis.    Lab Results:  Lab Results  Component Value Date   WBC 5.8 04/11/2019   HGB 11.4 (L) 04/11/2019   HCT 36.2 04/11/2019   MCV 104.3 (H) 04/11/2019   PLT 211 04/11/2019   NEUTROABS 10.2 (H) 03/26/2019    Imaging: CT images from 06/11/1999-reviewed Medications: I have reviewed the patient's current medications.  Assessment/Plan: 1.  Pancreas cancer-clinical stage I (T1 N0 M0)   Normal preoperative CA 19-9  Status post a pancreaticoduodenectomy procedure 08/07/2013  confirming a moderately differentiated (T3 N0) tumor with negative surgical margins, 12 negative lymph nodes, no lymphovascular invasion, perineural invasion present  CT abdomen/pelvis 02/15/2014 with no evidence of recurrent/metastatic disease.  CT abdomen/pelvis 01/19/2015 with no evidence of recurrent/metastatic disease.  CT abdomen/pelvis 07/20/2018-infiltrating soft tissue at the pancreas resection bed with involvement of the SMA, occlusion of the SMV with collaterals. New L2 compression fracture  SBRT beginning 08/21/2018, completed 08/31/2018, 5 fractions  CT 12/03/2018-no change in abnormal soft tissue encasing the SMA  CT 03/27/2019-new wall thickening and mucosal enhancement in the sigmoid and rectum, hepatic flexure wall thickening has resolved new or progressive ascending colonic wall thickening.  Persistent soft tissue surrounding the SMA-stable, no ascites or evidence of peritoneal metastases, enlarging ventral hernias  CT abdomen/pelvis 06/11/2019-ascending colitis, enlarging soft tissue surrounding the SMV and SMA with occlusion of the SMV and ascites, diverticulosis  2. Nausea following the Whipple procedure-improved 3. Mild thrombocytopenia , Chronic 4.Pain secondary to local recurrence of pancreas cancer 5.  Diarrhea-etiology unclear, Giardia antigen positive 03/21/2019, treated with 10 it is all    Disposition: [name] has a history of pancreas cancer.  She completed radiation for increased soft tissue to the pancreas resection at the pancreas resection bed.  She continues to have diarrhea following radiation to the retroperitoneal soft tissue pleated in April 2020.  Diarrhea has persisted despite Imodium, Lomotil, and pancreas enzyme replacement.  She will begin a trial of tincture of opium.  She will continue follow-up with Dr. Alessandra Bevels for management of diarrhea and evaluation of rectal bleeding.  She is scheduled for labs with Dr. Shirley Muscat today.  [name]  will be scheduled for a video visit in 2 weeks.  She will contact us sooner as needed.  She appears to have slow progression of pancreas cancer in the retroperitoneum.  There is no clinical evidence of distant tumor progression.  She does not appear to be a good candidate for systemic chemotherapy.   I discussed the assessment and treatment plan with the patient. The patient was provided an opportunity to ask questions and all were answered. The patient agreed with the plan and demonstrated an understanding of the instructions.   The patient was advised to call back or seek an in-person evaluation if the symptoms worsen or if the condition fails to improve as anticipated.  I provided 30 minutes of video, chart and image review, and documentation time during this encounter, and > 50% was spent counseling as documented under my assessment & plan.  [name] ANP/GNP-BC   06/19/2019 9:58 AM

## 2019-06-20 ENCOUNTER — Telehealth: Payer: Self-pay | Admitting: *Deleted

## 2019-06-20 ENCOUNTER — Encounter: Payer: Self-pay | Admitting: Oncology

## 2019-06-20 NOTE — Telephone Encounter (Addendum)
Pharmacist called to report the tincture of opium requires a PA from her insurance company to fill. Do not want to order it until it is approved. Provided fax # to send information for PA. @ [phone] PA form received and forwarded to Rosalind,RN to process.

## 2019-06-21 ENCOUNTER — Other Ambulatory Visit: Payer: Self-pay | Admitting: Nurse Practitioner

## 2019-06-21 DIAGNOSIS — C250 Malignant neoplasm of head of pancreas: Secondary | ICD-10-CM

## 2019-06-21 MED ORDER — OPIUM 10 MG/ML (1%) PO TINC: 0.6000 mL | Freq: Four times a day (QID) | ORAL | 0 refills | Status: DC | PRN

## 2019-06-24 ENCOUNTER — Other Ambulatory Visit (HOSPITAL_COMMUNITY): Payer: Self-pay | Admitting: *Deleted

## 2019-06-25 ENCOUNTER — Encounter: Payer: Self-pay | Admitting: Family Medicine

## 2019-06-25 ENCOUNTER — Other Ambulatory Visit: Payer: Self-pay

## 2019-06-25 ENCOUNTER — Ambulatory Visit (HOSPITAL_COMMUNITY)
Admission: RE | Admit: 2019-06-25 | Discharge: 2019-06-25 | Disposition: A | Discharge status: Home / Self Care | Payer: Medicare Other | Source: Ambulatory Visit | Attending: Gastroenterology | Admitting: Gastroenterology

## 2019-06-25 DIAGNOSIS — D649 Anemia, unspecified: Secondary | ICD-10-CM | POA: Insufficient documentation

## 2019-06-25 DIAGNOSIS — K921 Melena: Secondary | ICD-10-CM | POA: Insufficient documentation

## 2019-06-25 DIAGNOSIS — K625 Hemorrhage of anus and rectum: Secondary | ICD-10-CM | POA: Insufficient documentation

## 2019-06-25 LAB — PREPARE RBC (CROSSMATCH)

## 2019-06-25 MED ORDER — SODIUM CHLORIDE 0.9% IV SOLUTION: Freq: Once | INTRAVENOUS | Status: DC

## 2019-06-26 DIAGNOSIS — C250 Malignant neoplasm of head of pancreas: Secondary | ICD-10-CM | POA: Diagnosis not present

## 2019-06-26 DIAGNOSIS — Z9049 Acquired absence of other specified parts of digestive tract: Secondary | ICD-10-CM | POA: Diagnosis not present

## 2019-06-26 DIAGNOSIS — C259 Malignant neoplasm of pancreas, unspecified: Secondary | ICD-10-CM | POA: Diagnosis not present

## 2019-06-26 DIAGNOSIS — E538 Deficiency of other specified B group vitamins: Secondary | ICD-10-CM | POA: Diagnosis not present

## 2019-06-26 DIAGNOSIS — D649 Anemia, unspecified: Secondary | ICD-10-CM | POA: Diagnosis not present

## 2019-06-26 DIAGNOSIS — E039 Hypothyroidism, unspecified: Secondary | ICD-10-CM | POA: Diagnosis not present

## 2019-06-26 DIAGNOSIS — K529 Noninfective gastroenteritis and colitis, unspecified: Secondary | ICD-10-CM | POA: Diagnosis not present

## 2019-06-26 DIAGNOSIS — A071 Giardiasis [lambliasis]: Secondary | ICD-10-CM | POA: Diagnosis not present

## 2019-06-26 DIAGNOSIS — Z8719 Personal history of other diseases of the digestive system: Secondary | ICD-10-CM | POA: Diagnosis not present

## 2019-06-26 DIAGNOSIS — I10 Essential (primary) hypertension: Secondary | ICD-10-CM | POA: Diagnosis not present

## 2019-06-26 DIAGNOSIS — R9389 Abnormal findings on diagnostic imaging of other specified body structures: Secondary | ICD-10-CM | POA: Diagnosis not present

## 2019-06-26 LAB — TYPE AND SCREEN
ABO/RH(D): A POS
Antibody Screen: NEGATIVE
Unit division: 0

## 2019-06-26 LAB — BPAM RBC
Blood Product Expiration Date: 202102152359
ISSUE DATE / TIME: 202101261003
Unit Type and Rh: 6200

## 2019-07-01 ENCOUNTER — Other Ambulatory Visit: Payer: Self-pay | Admitting: Gastroenterology

## 2019-07-01 DIAGNOSIS — D696 Thrombocytopenia, unspecified: Secondary | ICD-10-CM | POA: Diagnosis not present

## 2019-07-01 DIAGNOSIS — E039 Hypothyroidism, unspecified: Secondary | ICD-10-CM | POA: Diagnosis not present

## 2019-07-01 DIAGNOSIS — C250 Malignant neoplasm of head of pancreas: Secondary | ICD-10-CM | POA: Diagnosis not present

## 2019-07-01 DIAGNOSIS — F0280 Dementia in other diseases classified elsewhere without behavioral disturbance: Secondary | ICD-10-CM | POA: Diagnosis not present

## 2019-07-01 DIAGNOSIS — M8949 Other hypertrophic osteoarthropathy, multiple sites: Secondary | ICD-10-CM | POA: Diagnosis not present

## 2019-07-01 DIAGNOSIS — A071 Giardiasis [lambliasis]: Secondary | ICD-10-CM | POA: Diagnosis not present

## 2019-07-01 DIAGNOSIS — I10 Essential (primary) hypertension: Secondary | ICD-10-CM | POA: Diagnosis not present

## 2019-07-01 DIAGNOSIS — K228 Other specified diseases of esophagus: Secondary | ICD-10-CM | POA: Diagnosis not present

## 2019-07-01 DIAGNOSIS — E538 Deficiency of other specified B group vitamins: Secondary | ICD-10-CM | POA: Diagnosis not present

## 2019-07-01 DIAGNOSIS — K529 Noninfective gastroenteritis and colitis, unspecified: Secondary | ICD-10-CM | POA: Diagnosis not present

## 2019-07-01 DIAGNOSIS — I341 Nonrheumatic mitral (valve) prolapse: Secondary | ICD-10-CM | POA: Diagnosis not present

## 2019-07-01 DIAGNOSIS — B961 Klebsiella pneumoniae [K. pneumoniae] as the cause of diseases classified elsewhere: Secondary | ICD-10-CM | POA: Diagnosis not present

## 2019-07-01 DIAGNOSIS — N179 Acute kidney failure, unspecified: Secondary | ICD-10-CM | POA: Diagnosis not present

## 2019-07-01 DIAGNOSIS — H409 Unspecified glaucoma: Secondary | ICD-10-CM | POA: Diagnosis not present

## 2019-07-01 DIAGNOSIS — E8779 Other fluid overload: Secondary | ICD-10-CM | POA: Diagnosis not present

## 2019-07-01 DIAGNOSIS — E785 Hyperlipidemia, unspecified: Secondary | ICD-10-CM | POA: Diagnosis not present

## 2019-07-01 DIAGNOSIS — G8929 Other chronic pain: Secondary | ICD-10-CM | POA: Diagnosis not present

## 2019-07-01 DIAGNOSIS — H6990 Unspecified Eustachian tube disorder, unspecified ear: Secondary | ICD-10-CM | POA: Diagnosis not present

## 2019-07-01 DIAGNOSIS — E876 Hypokalemia: Secondary | ICD-10-CM | POA: Diagnosis not present

## 2019-07-01 DIAGNOSIS — K219 Gastro-esophageal reflux disease without esophagitis: Secondary | ICD-10-CM | POA: Diagnosis not present

## 2019-07-01 DIAGNOSIS — G629 Polyneuropathy, unspecified: Secondary | ICD-10-CM | POA: Diagnosis not present

## 2019-07-01 DIAGNOSIS — I69344 Monoplegia of lower limb following cerebral infarction affecting left non-dominant side: Secondary | ICD-10-CM | POA: Diagnosis not present

## 2019-07-01 DIAGNOSIS — N390 Urinary tract infection, site not specified: Secondary | ICD-10-CM | POA: Diagnosis not present

## 2019-07-01 DIAGNOSIS — L509 Urticaria, unspecified: Secondary | ICD-10-CM | POA: Diagnosis not present

## 2019-07-02 DIAGNOSIS — G629 Polyneuropathy, unspecified: Secondary | ICD-10-CM | POA: Diagnosis not present

## 2019-07-02 DIAGNOSIS — C250 Malignant neoplasm of head of pancreas: Secondary | ICD-10-CM | POA: Diagnosis not present

## 2019-07-02 DIAGNOSIS — D696 Thrombocytopenia, unspecified: Secondary | ICD-10-CM

## 2019-07-02 DIAGNOSIS — L509 Urticaria, unspecified: Secondary | ICD-10-CM

## 2019-07-02 DIAGNOSIS — E538 Deficiency of other specified B group vitamins: Secondary | ICD-10-CM

## 2019-07-02 DIAGNOSIS — N179 Acute kidney failure, unspecified: Secondary | ICD-10-CM | POA: Diagnosis not present

## 2019-07-02 DIAGNOSIS — H409 Unspecified glaucoma: Secondary | ICD-10-CM

## 2019-07-02 DIAGNOSIS — I69344 Monoplegia of lower limb following cerebral infarction affecting left non-dominant side: Secondary | ICD-10-CM

## 2019-07-02 DIAGNOSIS — M8949 Other hypertrophic osteoarthropathy, multiple sites: Secondary | ICD-10-CM

## 2019-07-02 DIAGNOSIS — G8929 Other chronic pain: Secondary | ICD-10-CM

## 2019-07-02 DIAGNOSIS — H6990 Unspecified Eustachian tube disorder, unspecified ear: Secondary | ICD-10-CM

## 2019-07-02 DIAGNOSIS — K228 Other specified diseases of esophagus: Secondary | ICD-10-CM

## 2019-07-02 DIAGNOSIS — A071 Giardiasis [lambliasis]: Secondary | ICD-10-CM | POA: Diagnosis not present

## 2019-07-02 DIAGNOSIS — B961 Klebsiella pneumoniae [K. pneumoniae] as the cause of diseases classified elsewhere: Secondary | ICD-10-CM | POA: Diagnosis not present

## 2019-07-02 DIAGNOSIS — E039 Hypothyroidism, unspecified: Secondary | ICD-10-CM

## 2019-07-02 DIAGNOSIS — I341 Nonrheumatic mitral (valve) prolapse: Secondary | ICD-10-CM

## 2019-07-02 DIAGNOSIS — K219 Gastro-esophageal reflux disease without esophagitis: Secondary | ICD-10-CM

## 2019-07-02 DIAGNOSIS — E8779 Other fluid overload: Secondary | ICD-10-CM

## 2019-07-02 DIAGNOSIS — E785 Hyperlipidemia, unspecified: Secondary | ICD-10-CM

## 2019-07-02 DIAGNOSIS — I10 Essential (primary) hypertension: Secondary | ICD-10-CM | POA: Diagnosis not present

## 2019-07-02 DIAGNOSIS — K529 Noninfective gastroenteritis and colitis, unspecified: Secondary | ICD-10-CM | POA: Diagnosis not present

## 2019-07-02 DIAGNOSIS — N390 Urinary tract infection, site not specified: Secondary | ICD-10-CM | POA: Diagnosis not present

## 2019-07-02 DIAGNOSIS — F0280 Dementia in other diseases classified elsewhere without behavioral disturbance: Secondary | ICD-10-CM | POA: Diagnosis not present

## 2019-07-02 DIAGNOSIS — E876 Hypokalemia: Secondary | ICD-10-CM

## 2019-07-03 ENCOUNTER — Telehealth: Payer: Self-pay | Admitting: *Deleted

## 2019-07-03 NOTE — Telephone Encounter (Signed)
Per Dr. [name]: Unlikely related to tincture. Could stop med for 3-4 days and see if itching is better. If worsens, can try OTC Benadryl. Call back if it does not resolve.

## 2019-07-03 NOTE — Telephone Encounter (Signed)
Has been using tincture of opium for ~ 1 week. 3-4 days ago she starting having itching of eye lids, lashes and around her eyes. Slightly puffy and red due to rubbing them so much. May be having small amount of clear drainage from right eye. Denies she has had anything like this in past. Asking if this is an allergic reaction to the tincture? If so, what is next step?

## 2019-07-04 ENCOUNTER — Encounter: Payer: Self-pay | Admitting: Oncology

## 2019-07-05 DIAGNOSIS — D649 Anemia, unspecified: Secondary | ICD-10-CM | POA: Diagnosis not present

## 2019-07-09 DIAGNOSIS — I10 Essential (primary) hypertension: Secondary | ICD-10-CM | POA: Diagnosis not present

## 2019-07-09 DIAGNOSIS — C250 Malignant neoplasm of head of pancreas: Secondary | ICD-10-CM | POA: Diagnosis not present

## 2019-07-09 DIAGNOSIS — A071 Giardiasis [lambliasis]: Secondary | ICD-10-CM | POA: Diagnosis not present

## 2019-07-09 DIAGNOSIS — E538 Deficiency of other specified B group vitamins: Secondary | ICD-10-CM | POA: Diagnosis not present

## 2019-07-09 DIAGNOSIS — E039 Hypothyroidism, unspecified: Secondary | ICD-10-CM | POA: Diagnosis not present

## 2019-07-09 DIAGNOSIS — K529 Noninfective gastroenteritis and colitis, unspecified: Secondary | ICD-10-CM | POA: Diagnosis not present

## 2019-07-12 ENCOUNTER — Ambulatory Visit: Payer: Medicare Other | Admitting: Family Medicine

## 2019-07-15 ENCOUNTER — Inpatient Hospital Stay: Payer: Medicare Other | Admitting: Nurse Practitioner
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
## 2019-07-16 ENCOUNTER — Other Ambulatory Visit (HOSPITAL_COMMUNITY)
Admission: RE | Admit: 2019-07-16 | Discharge: 2019-07-16 | Disposition: A | Discharge status: Home / Self Care | Payer: Medicare Other | Source: Ambulatory Visit | Attending: Gastroenterology | Admitting: Gastroenterology

## 2019-07-16 ENCOUNTER — Encounter: Payer: Self-pay | Admitting: Nurse Practitioner

## 2019-07-16 ENCOUNTER — Telehealth: Payer: Self-pay | Admitting: Nurse Practitioner

## 2019-07-16 ENCOUNTER — Other Ambulatory Visit: Payer: Self-pay

## 2019-07-16 ENCOUNTER — Inpatient Hospital Stay: Payer: Medicare Other | Attending: Oncology | Admitting: Nurse Practitioner

## 2019-07-16 VITALS — BP 116/93 | HR 78 | Temp 98.3°F | Resp 18 | Ht 61.0 in | Wt 136.9 lb

## 2019-07-16 DIAGNOSIS — Z01812 Encounter for preprocedural laboratory examination: Secondary | ICD-10-CM | POA: Insufficient documentation

## 2019-07-16 DIAGNOSIS — C250 Malignant neoplasm of head of pancreas: Secondary | ICD-10-CM

## 2019-07-16 DIAGNOSIS — Z20822 Contact with and (suspected) exposure to covid-19: Secondary | ICD-10-CM | POA: Diagnosis not present

## 2019-07-16 LAB — SARS CORONAVIRUS 2 (TAT 6-24 HRS): SARS Coronavirus 2: NEGATIVE

## 2019-07-16 NOTE — Telephone Encounter (Signed)
Scheduled appt per 2/16 los - gave patient AVS and calender per los 

## 2019-07-19 ENCOUNTER — Encounter (HOSPITAL_COMMUNITY): Payer: Self-pay | Admitting: Gastroenterology

## 2019-07-19 ENCOUNTER — Ambulatory Visit (HOSPITAL_COMMUNITY): Payer: Medicare Other | Admitting: Certified Registered Nurse Anesthetist

## 2019-07-19 ENCOUNTER — Other Ambulatory Visit: Payer: Self-pay

## 2019-07-19 ENCOUNTER — Ambulatory Visit (HOSPITAL_COMMUNITY)
Admission: RE | Admit: 2019-07-19 | Discharge: 2019-07-19 | Disposition: A | Discharge status: Home / Self Care | Payer: Medicare Other | Attending: Gastroenterology | Admitting: Gastroenterology

## 2019-07-19 ENCOUNTER — Encounter (HOSPITAL_COMMUNITY)
Admission: RE | Disposition: A | Discharge status: Home / Self Care | Payer: Self-pay | Source: Home / Self Care | Attending: Gastroenterology

## 2019-07-19 DIAGNOSIS — I10 Essential (primary) hypertension: Secondary | ICD-10-CM | POA: Insufficient documentation

## 2019-07-19 DIAGNOSIS — I493 Ventricular premature depolarization: Secondary | ICD-10-CM | POA: Diagnosis not present

## 2019-07-19 DIAGNOSIS — Z7982 Long term (current) use of aspirin: Secondary | ICD-10-CM | POA: Diagnosis not present

## 2019-07-19 DIAGNOSIS — Z8507 Personal history of malignant neoplasm of pancreas: Secondary | ICD-10-CM | POA: Insufficient documentation

## 2019-07-19 DIAGNOSIS — K449 Diaphragmatic hernia without obstruction or gangrene: Secondary | ICD-10-CM | POA: Insufficient documentation

## 2019-07-19 DIAGNOSIS — Z9049 Acquired absence of other specified parts of digestive tract: Secondary | ICD-10-CM | POA: Insufficient documentation

## 2019-07-19 DIAGNOSIS — R933 Abnormal findings on diagnostic imaging of other parts of digestive tract: Secondary | ICD-10-CM | POA: Insufficient documentation

## 2019-07-19 DIAGNOSIS — K219 Gastro-esophageal reflux disease without esophagitis: Secondary | ICD-10-CM | POA: Diagnosis not present

## 2019-07-19 DIAGNOSIS — K644 Residual hemorrhoidal skin tags: Secondary | ICD-10-CM | POA: Diagnosis not present

## 2019-07-19 DIAGNOSIS — K2970 Gastritis, unspecified, without bleeding: Secondary | ICD-10-CM | POA: Diagnosis not present

## 2019-07-19 DIAGNOSIS — Z79890 Hormone replacement therapy (postmenopausal): Secondary | ICD-10-CM | POA: Diagnosis not present

## 2019-07-19 DIAGNOSIS — K529 Noninfective gastroenteritis and colitis, unspecified: Secondary | ICD-10-CM | POA: Insufficient documentation

## 2019-07-19 DIAGNOSIS — Z882 Allergy status to sulfonamides status: Secondary | ICD-10-CM | POA: Insufficient documentation

## 2019-07-19 DIAGNOSIS — I341 Nonrheumatic mitral (valve) prolapse: Secondary | ICD-10-CM | POA: Diagnosis not present

## 2019-07-19 DIAGNOSIS — K8689 Other specified diseases of pancreas: Secondary | ICD-10-CM | POA: Insufficient documentation

## 2019-07-19 DIAGNOSIS — K317 Polyp of stomach and duodenum: Secondary | ICD-10-CM | POA: Diagnosis not present

## 2019-07-19 DIAGNOSIS — K635 Polyp of colon: Secondary | ICD-10-CM | POA: Diagnosis not present

## 2019-07-19 DIAGNOSIS — Z79899 Other long term (current) drug therapy: Secondary | ICD-10-CM | POA: Insufficient documentation

## 2019-07-19 DIAGNOSIS — I2510 Atherosclerotic heart disease of native coronary artery without angina pectoris: Secondary | ICD-10-CM | POA: Insufficient documentation

## 2019-07-19 DIAGNOSIS — Z881 Allergy status to other antibiotic agents status: Secondary | ICD-10-CM | POA: Insufficient documentation

## 2019-07-19 DIAGNOSIS — M1990 Unspecified osteoarthritis, unspecified site: Secondary | ICD-10-CM | POA: Insufficient documentation

## 2019-07-19 DIAGNOSIS — D122 Benign neoplasm of ascending colon: Secondary | ICD-10-CM | POA: Insufficient documentation

## 2019-07-19 DIAGNOSIS — D509 Iron deficiency anemia, unspecified: Secondary | ICD-10-CM | POA: Diagnosis not present

## 2019-07-19 DIAGNOSIS — E039 Hypothyroidism, unspecified: Secondary | ICD-10-CM | POA: Diagnosis not present

## 2019-07-19 DIAGNOSIS — Z96652 Presence of left artificial knee joint: Secondary | ICD-10-CM | POA: Insufficient documentation

## 2019-07-19 DIAGNOSIS — F329 Major depressive disorder, single episode, unspecified: Secondary | ICD-10-CM | POA: Insufficient documentation

## 2019-07-19 DIAGNOSIS — Z888 Allergy status to other drugs, medicaments and biological substances status: Secondary | ICD-10-CM | POA: Insufficient documentation

## 2019-07-19 DIAGNOSIS — K6389 Other specified diseases of intestine: Secondary | ICD-10-CM | POA: Diagnosis not present

## 2019-07-19 DIAGNOSIS — I69844 Monoplegia of lower limb following other cerebrovascular disease affecting left non-dominant side: Secondary | ICD-10-CM | POA: Diagnosis not present

## 2019-07-19 DIAGNOSIS — H409 Unspecified glaucoma: Secondary | ICD-10-CM | POA: Diagnosis not present

## 2019-07-19 DIAGNOSIS — K648 Other hemorrhoids: Secondary | ICD-10-CM | POA: Diagnosis not present

## 2019-07-19 DIAGNOSIS — R935 Abnormal findings on diagnostic imaging of other abdominal regions, including retroperitoneum: Secondary | ICD-10-CM | POA: Diagnosis not present

## 2019-07-19 HISTORY — PX: POLYPECTOMY: SHX5525

## 2019-07-19 HISTORY — PX: ESOPHAGOGASTRODUODENOSCOPY (EGD) WITH PROPOFOL: SHX5813

## 2019-07-19 HISTORY — PX: COLONOSCOPY WITH PROPOFOL: SHX5780

## 2019-07-19 HISTORY — PX: BIOPSY: SHX5522

## 2019-07-19 HISTORY — PX: HEMOSTASIS CLIP PLACEMENT: SHX6857

## 2019-07-19 LAB — HM COLONOSCOPY

## 2019-07-19 SURGERY — COLONOSCOPY WITH PROPOFOL
Anesthesia: Monitor Anesthesia Care

## 2019-07-19 MED ORDER — PROPOFOL 10 MG/ML IV BOLUS
INTRAVENOUS | Status: DC | PRN
  Administered 2019-07-19: 60 mg via INTRAVENOUS

## 2019-07-19 MED ORDER — SODIUM CHLORIDE 0.9 % IV SOLN: INTRAVENOUS | Status: DC

## 2019-07-19 MED ORDER — LACTATED RINGERS IV SOLN: INTRAVENOUS | Status: DC

## 2019-07-19 MED ORDER — LIDOCAINE 2% (20 MG/ML) 5 ML SYRINGE
INTRAMUSCULAR | Status: DC | PRN
  Administered 2019-07-19: 60 mg via INTRAVENOUS

## 2019-07-19 MED ORDER — PROPOFOL 500 MG/50ML IV EMUL
INTRAVENOUS | Status: DC | PRN
  Administered 2019-07-19: 125 ug/kg/min via INTRAVENOUS

## 2019-07-19 MED ORDER — LACTATED RINGERS IV SOLN
INTRAVENOUS | Status: AC | PRN
  Administered 2019-07-19: 1000 mL via INTRAVENOUS

## 2019-07-19 SURGICAL SUPPLY — 24 items
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
## 2019-07-19 NOTE — Anesthesia Postprocedure Evaluation (Signed)
Anesthesia Post Note  Patient: [name]  Procedure(s) Performed: COLONOSCOPY WITH PROPOFOL (N/A ) ESOPHAGOGASTRODUODENOSCOPY (EGD) WITH PROPOFOL (N/A ) BIOPSY HEMOSTASIS CLIP PLACEMENT POLYPECTOMY     Patient location during evaluation: PACU Anesthesia Type: MAC Level of consciousness: awake and alert Pain management: pain level controlled Vital Signs Assessment: post-procedure vital signs reviewed and stable Respiratory status: spontaneous breathing, nonlabored ventilation and respiratory function stable Cardiovascular status: stable and blood pressure returned to baseline Postop Assessment: no apparent nausea or vomiting Anesthetic complications: no    Last Vitals:  Vitals:   07/19/19 1204 07/19/19 1209  BP: (!) 121/56 127/61  Pulse: 79 81  Resp: 17 18  Temp:    SpO2: 97% 98%    Last Pain:  Vitals:   07/19/19 1209  TempSrc:   PainSc: 0-No pain                 Catalina Gravel

## 2019-07-19 NOTE — Transfer of Care (Signed)
Immediate Anesthesia Transfer of Care Note  Patient: TYLESHA GIBEAULT  Procedure(s) Performed: Procedure(s): COLONOSCOPY WITH PROPOFOL (N/A) ESOPHAGOGASTRODUODENOSCOPY (EGD) WITH PROPOFOL (N/A) BIOPSY HEMOSTASIS CLIP PLACEMENT POLYPECTOMY  Patient Location: PACU and Endoscopy Unit  Anesthesia Type:MAC  Level of Consciousness: awake, alert  and oriented  Airway & Oxygen Therapy: Patient Spontanous Breathing and Patient connected to nasal cannula oxygen  Post-op Assessment: Report given to RN and Post -op Vital signs reviewed and stable  Post vital signs: Reviewed and stable  Last Vitals:  Vitals:   07/19/19 1107  BP: (!) 162/67  Pulse: 82  Resp: 17  Temp: 36.7 C  SpO2: 736%    Complications: No apparent anesthesia complications

## 2019-07-19 NOTE — Anesthesia Preprocedure Evaluation (Signed)
Anesthesia Evaluation  Patient identified by MRN, date of birth, ID band Patient awake    Reviewed: Allergy & Precautions, NPO status , Patient's Chart, lab work & pertinent test results  Airway Mallampati: II  TM Distance: >3 FB Neck ROM: Full    Dental  (+) Teeth Intact, Dental Advisory Given   Pulmonary neg pulmonary ROS,    Pulmonary exam normal breath sounds clear to auscultation       Cardiovascular hypertension, + CAD and + Peripheral Vascular Disease  Normal cardiovascular exam+ Valvular Problems/Murmurs MVP  Rhythm:Regular Rate:Normal     Neuro/Psych PSYCHIATRIC DISORDERS Depression TIA Neuromuscular disease CVA    GI/Hepatic hiatal hernia, GERD  ,H/O PANCREATIC CANCER    Endo/Other  Hypothyroidism   Renal/GU negative Renal ROS     Musculoskeletal  (+) Arthritis ,   Abdominal   Peds  Hematology negative hematology ROS (+)   Anesthesia Other Findings Day of surgery medications reviewed with the patient.  Reproductive/Obstetrics                            Anesthesia Physical Anesthesia Plan  ASA: III  Anesthesia Plan: MAC   Post-op Pain Management:    Induction: Intravenous  PONV Risk Score and Plan: 2 and Propofol infusion and Treatment may vary due to age or medical condition  Airway Management Planned: Nasal Cannula and Natural Airway  Additional Equipment:   Intra-op Plan:   Post-operative Plan:   Informed Consent: I have reviewed the patients History and Physical, chart, labs and discussed the procedure including the risks, benefits and alternatives for the proposed anesthesia with the patient or authorized representative who has indicated his/her understanding and acceptance.     Dental advisory given  Plan Discussed with: CRNA and Anesthesiologist  Anesthesia Plan Comments:         Anesthesia Quick Evaluation

## 2019-07-19 NOTE — Discharge Instructions (Signed)

## 2019-07-19 NOTE — Op Note (Signed)
[facility] Patient Name: [name] Procedure Date: 07/19/2019 MRN: [id number] Attending MD: [name] , MD Date of Birth: Jul 26, 1940 CSN: [id number] Age: 79 Admit Type: Outpatient Procedure:                Colonoscopy Indications:              Last colonoscopy: 2016, Abnormal CT of the GI                            tract, Chronic diarrhea Providers:                [name], MD, Kary Kos, RN, Cleda Daub, RN, Theodora Blow, Technician Referring MD:              Medicines:                Sedation Administered by an Anesthesia Professional Complications:            No immediate complications. Estimated Blood Loss:     Estimated blood loss was minimal. Procedure:                Pre-Anesthesia Assessment:                           - Prior to the procedure, a History and Physical                            was performed, and patient medications and                            allergies were reviewed. The patient's tolerance of                            previous anesthesia was also reviewed. The risks                            and benefits of the procedure and the sedation                            options and risks were discussed with the patient.                            All questions were answered, and informed consent                            was obtained. Prior Anticoagulants: The patient has                            taken no previous anticoagulant or antiplatelet                            agents. ASA Grade Assessment: III - A patient with  severe systemic disease. After reviewing the risks                            and benefits, the patient was deemed in                            satisfactory condition to undergo the procedure.                           - Prior to the procedure, a History and Physical                            was performed, and patient medications and                             allergies were reviewed. The patient's tolerance of                            previous anesthesia was also reviewed. The risks                            and benefits of the procedure and the sedation                            options and risks were discussed with the patient.                            All questions were answered, and informed consent                            was obtained. Prior Anticoagulants: The patient has                            taken no previous anticoagulant or antiplatelet                            agents. ASA Grade Assessment: III - A patient with                            severe systemic disease. After reviewing the risks                            and benefits, the patient was deemed in                            satisfactory condition to undergo the procedure.                           After obtaining informed consent, the colonoscope                            was passed under direct vision. Throughout the  procedure, the patient's blood pressure, pulse, and                            oxygen saturations were monitored continuously. The                            [id number]-[id number] Olympus pediatric colonscope                            was introduced through the anus and advanced to the                            the cecum, identified by appendiceal orifice and                            ileocecal valve. The colonoscopy was performed                            without difficulty. The patient tolerated the                            procedure well. The quality of the bowel                            preparation was good. Scope In: 11:40:38 AM Scope Out: 11:59:08 AM Scope Withdrawal Time: 0 hours 11 minutes 3 seconds  Total Procedure Duration: 0 hours 18 minutes 30 seconds  Findings:      Hemorrhoids were found on perianal exam.      A scattered area of moderately congested, erythematous and thickened        folds of the mucosa was found in the ascending colon. Biopsies were       taken with a cold forceps for histology. No evidence of severe colitis.      A 5 mm polyp was found in the ascending colon. The polyp was sessile.       The polyp was removed with a cold snare. Resection and retrieval were       complete.      There was significant spasm in the ascending colon. Not able to intubate       TI.      Normal mucosa was found in the sigmoid colon, in the descending colon       and in the transverse colon. Biopsies for histology were taken with a       cold forceps for evaluation of microscopic colitis.      Multiple diverticula were found in the sigmoid colon.      Internal hemorrhoids were found during retroflexion. The hemorrhoids       were medium-sized. Impression:               - Hemorrhoids found on perianal exam.                           - Congested, erythematous and thickened folds of                            the mucosa in the ascending colon.  Biopsied.                           - One 5 mm polyp in the ascending colon, removed                            with a cold snare. Resected and retrieved.                           - Significant colonic spasm.                           - Normal mucosa in the sigmoid colon, in the                            descending colon and in the transverse colon.                            Biopsied.                           - Diverticulosis in the sigmoid colon.                           - Internal hemorrhoids. Moderate Sedation:      Moderate (conscious) sedation was personally administered by an       anesthesia professional. The following parameters were monitored: oxygen       saturation, heart rate, blood pressure, and response to care. Recommendation:           - Patient has a contact number available for                            emergencies. The signs and symptoms of potential                            delayed complications were  discussed with the                            patient. Return to normal activities tomorrow.                            Written discharge instructions were provided to the                            patient.                           - Resume previous diet.                           - Continue present medications.                           - Await pathology results.                           -  No repeat colonoscopy due to current age (19                            years or older).                           - Return to my office as previously scheduled. Procedure Code(s):        --- Professional ---                           [id number], Colonoscopy, flexible; with removal of                            tumor(s), polyp(s), or other lesion(s) by snare                            technique                           45380, 42, Colonoscopy, flexible; with biopsy,                            single or multiple Diagnosis Code(s):        --- Professional ---                           K64.8, Other hemorrhoids                           K58.0, Irritable bowel syndrome with diarrhea                           K63.89, Other specified diseases of intestine                           K63.5, Polyp of colon                           K52.9, Noninfective gastroenteritis and colitis,                            unspecified                           K57.30, Diverticulosis of large intestine without                            perforation or abscess without bleeding                           R93.3, Abnormal findings on diagnostic imaging of                            other parts of digestive tract CPT copyright 2019 [employer]. All rights reserved. The codes documented in this report are preliminary and upon coder review may  be revised to meet current compliance requirements. [name], MD [name], MD 07/19/2019  12:12:45 PM Number of Addenda: 0

## 2019-07-19 NOTE — Op Note (Signed)
[facility] Patient Name: [name] Procedure Date: 07/19/2019 MRN: [id number] Attending MD: [name] , MD Date of Birth: [birth year] CSN: [id number] Age: 79 Admit Type: Outpatient Procedure:                Upper GI endoscopy Indications:              Iron deficiency anemia Providers:                [name], MD, Kary Kos, RN, Cleda Daub, RN, Theodora Blow, Technician Referring MD:              Medicines:                Sedation Administered by an Anesthesia Professional Complications:            No immediate complications. Estimated Blood Loss:     Estimated blood loss was minimal. Procedure:                Pre-Anesthesia Assessment:                           - Prior to the procedure, a History and Physical                            was performed, and patient medications and                            allergies were reviewed. The patient's tolerance of                            previous anesthesia was also reviewed. The risks                            and benefits of the procedure and the sedation                            options and risks were discussed with the patient.                            All questions were answered, and informed consent                            was obtained. Prior Anticoagulants: The patient has                            taken no previous anticoagulant or antiplatelet                            agents. ASA Grade Assessment: III - A patient with                            severe systemic disease. After reviewing the risks  and benefits, the patient was deemed in                            satisfactory condition to undergo the procedure.                           After obtaining informed consent, the endoscope was                            passed under direct vision. Throughout the                            procedure, the patient's blood pressure, pulse, and                             oxygen saturations were monitored continuously. The                            [id number]-H190 [id number] Olympus gastroscope was                            introduced through the mouth, and advanced to the                            second part of duodenum. The upper GI endoscopy was                            accomplished without difficulty. The patient                            tolerated the procedure well. Scope In: Scope Out: Findings:      The Z-line was regular and was found 36 cm from the incisors.      The exam of the esophagus was otherwise normal.      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 This was       characterized by congestion, erythema and inflammation and localized       area of thick fold. Biopsies were taken with a cold forceps for       histology.      Patient had pulsating bleeding from biopsy site. 2 clips were placed to       stop the bleeding. Further biopsy or polypectomy was not performed.      A single diminutive sessile polyp was found in the gastric fundus.      Diffuse moderate inflammation characterized by congestion (edema) and       erythema was found in the entire examined stomach.      The examined jejunum was normal. Impression:               - Z-line regular, 36 cm from the incisors.                           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 Biopsied.                           - A single gastric polyp.                           - Gastritis.                           - Normal examined jejunum. Moderate Sedation:      Moderate (conscious) sedation was personally administered by an       anesthesia professional. The following parameters were monitored: oxygen       saturation, heart rate, blood pressure, and response to care. Recommendation:           - Patient has a contact number available for                             emergencies. The signs and symptoms of potential                            delayed complications were discussed with the                            patient. Return to normal activities tomorrow.                            Written discharge instructions were provided to the                            patient.                           - Resume previous diet.                           - Continue present medications.                           - Await pathology results. 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 All rights reserved. The codes documented in this report are preliminary and upon coder review may  be revised to meet current compliance requirements. [name], MD [name], MD 07/19/2019 12:06:57 PM Number of Addenda: 0

## 2019-07-22 ENCOUNTER — Ambulatory Visit (HOSPITAL_COMMUNITY)
Admission: RE | Admit: 2019-07-22 | Discharge: 2019-07-22 | Disposition: A | Discharge status: Home / Self Care | Payer: Medicare Other | Source: Ambulatory Visit | Attending: Nurse Practitioner | Admitting: Nurse Practitioner

## 2019-07-22 ENCOUNTER — Other Ambulatory Visit: Payer: Self-pay

## 2019-07-22 ENCOUNTER — Encounter: Payer: Self-pay | Admitting: *Deleted

## 2019-07-22 DIAGNOSIS — C250 Malignant neoplasm of head of pancreas: Secondary | ICD-10-CM

## 2019-07-22 DIAGNOSIS — R188 Other ascites: Secondary | ICD-10-CM | POA: Diagnosis not present

## 2019-07-22 MED ORDER — LIDOCAINE HCL 1 % IJ SOLN
INTRAMUSCULAR | Status: AC
  Filled 2019-07-22: qty 20

## 2019-07-22 NOTE — Procedures (Signed)
PROCEDURE SUMMARY:  Successful US guided paracentesis from left lateral abdomen.  Yielded 300 mL of pale, yellow fluid.  No immediate complications.  Pt tolerated well.   Specimen was sent for labs.  EBL < 71mL  Docia Barrier PA-C 07/22/2019 12:31 PM

## 2019-07-23 DIAGNOSIS — I10 Essential (primary) hypertension: Secondary | ICD-10-CM | POA: Diagnosis not present

## 2019-07-23 DIAGNOSIS — K529 Noninfective gastroenteritis and colitis, unspecified: Secondary | ICD-10-CM | POA: Diagnosis not present

## 2019-07-23 DIAGNOSIS — E039 Hypothyroidism, unspecified: Secondary | ICD-10-CM | POA: Diagnosis not present

## 2019-07-23 DIAGNOSIS — C250 Malignant neoplasm of head of pancreas: Secondary | ICD-10-CM | POA: Diagnosis not present

## 2019-07-23 DIAGNOSIS — A071 Giardiasis [lambliasis]: Secondary | ICD-10-CM | POA: Diagnosis not present

## 2019-07-23 DIAGNOSIS — E538 Deficiency of other specified B group vitamins: Secondary | ICD-10-CM | POA: Diagnosis not present

## 2019-07-23 LAB — SURGICAL PATHOLOGY

## 2019-07-24 LAB — CYTOLOGY - NON PAP

## 2019-07-27 ENCOUNTER — Inpatient Hospital Stay (HOSPITAL_COMMUNITY)
Admission: EM | Admit: 2019-07-27 | Discharge: 2019-08-02 | DRG: 811 | Disposition: A | Discharge status: Hospice | Payer: Medicare Other | Attending: Internal Medicine | Admitting: Internal Medicine

## 2019-07-27 ENCOUNTER — Emergency Department (HOSPITAL_COMMUNITY): Payer: Medicare Other

## 2019-07-27 ENCOUNTER — Encounter (HOSPITAL_COMMUNITY): Payer: Self-pay | Admitting: *Deleted

## 2019-07-27 ENCOUNTER — Other Ambulatory Visit: Payer: Self-pay

## 2019-07-27 DIAGNOSIS — K922 Gastrointestinal hemorrhage, unspecified: Secondary | ICD-10-CM | POA: Diagnosis present

## 2019-07-27 DIAGNOSIS — K625 Hemorrhage of anus and rectum: Secondary | ICD-10-CM | POA: Diagnosis not present

## 2019-07-27 DIAGNOSIS — C250 Malignant neoplasm of head of pancreas: Secondary | ICD-10-CM

## 2019-07-27 DIAGNOSIS — H409 Unspecified glaucoma: Secondary | ICD-10-CM | POA: Diagnosis present

## 2019-07-27 DIAGNOSIS — K219 Gastro-esophageal reflux disease without esophagitis: Secondary | ICD-10-CM | POA: Diagnosis present

## 2019-07-27 DIAGNOSIS — K5730 Diverticulosis of large intestine without perforation or abscess without bleeding: Secondary | ICD-10-CM | POA: Diagnosis not present

## 2019-07-27 DIAGNOSIS — Z79890 Hormone replacement therapy (postmenopausal): Secondary | ICD-10-CM

## 2019-07-27 DIAGNOSIS — Z79899 Other long term (current) drug therapy: Secondary | ICD-10-CM

## 2019-07-27 DIAGNOSIS — Z515 Encounter for palliative care: Secondary | ICD-10-CM | POA: Diagnosis not present

## 2019-07-27 DIAGNOSIS — Z881 Allergy status to other antibiotic agents status: Secondary | ICD-10-CM

## 2019-07-27 DIAGNOSIS — R188 Other ascites: Secondary | ICD-10-CM

## 2019-07-27 DIAGNOSIS — K529 Noninfective gastroenteritis and colitis, unspecified: Secondary | ICD-10-CM | POA: Diagnosis present

## 2019-07-27 DIAGNOSIS — Z7982 Long term (current) use of aspirin: Secondary | ICD-10-CM

## 2019-07-27 DIAGNOSIS — Z885 Allergy status to narcotic agent status: Secondary | ICD-10-CM

## 2019-07-27 DIAGNOSIS — D649 Anemia, unspecified: Secondary | ICD-10-CM | POA: Diagnosis present

## 2019-07-27 DIAGNOSIS — Z66 Do not resuscitate: Secondary | ICD-10-CM | POA: Diagnosis not present

## 2019-07-27 DIAGNOSIS — F329 Major depressive disorder, single episode, unspecified: Secondary | ICD-10-CM | POA: Diagnosis present

## 2019-07-27 DIAGNOSIS — Z20822 Contact with and (suspected) exposure to covid-19: Secondary | ICD-10-CM | POA: Diagnosis present

## 2019-07-27 DIAGNOSIS — D638 Anemia in other chronic diseases classified elsewhere: Secondary | ICD-10-CM | POA: Diagnosis present

## 2019-07-27 DIAGNOSIS — Z87440 Personal history of urinary (tract) infections: Secondary | ICD-10-CM

## 2019-07-27 DIAGNOSIS — Z8673 Personal history of transient ischemic attack (TIA), and cerebral infarction without residual deficits: Secondary | ICD-10-CM

## 2019-07-27 DIAGNOSIS — K649 Unspecified hemorrhoids: Secondary | ICD-10-CM | POA: Diagnosis present

## 2019-07-27 DIAGNOSIS — G629 Polyneuropathy, unspecified: Secondary | ICD-10-CM | POA: Diagnosis present

## 2019-07-27 DIAGNOSIS — I10 Essential (primary) hypertension: Secondary | ICD-10-CM | POA: Diagnosis present

## 2019-07-27 DIAGNOSIS — R0602 Shortness of breath: Secondary | ICD-10-CM | POA: Diagnosis not present

## 2019-07-27 DIAGNOSIS — R1084 Generalized abdominal pain: Secondary | ICD-10-CM

## 2019-07-27 DIAGNOSIS — I341 Nonrheumatic mitral (valve) prolapse: Secondary | ICD-10-CM | POA: Diagnosis present

## 2019-07-27 DIAGNOSIS — M1990 Unspecified osteoarthritis, unspecified site: Secondary | ICD-10-CM | POA: Diagnosis present

## 2019-07-27 DIAGNOSIS — D6959 Other secondary thrombocytopenia: Secondary | ICD-10-CM | POA: Diagnosis present

## 2019-07-27 DIAGNOSIS — Z888 Allergy status to other drugs, medicaments and biological substances status: Secondary | ICD-10-CM

## 2019-07-27 DIAGNOSIS — Z882 Allergy status to sulfonamides status: Secondary | ICD-10-CM

## 2019-07-27 DIAGNOSIS — D62 Acute posthemorrhagic anemia: Secondary | ICD-10-CM | POA: Diagnosis not present

## 2019-07-27 DIAGNOSIS — C259 Malignant neoplasm of pancreas, unspecified: Secondary | ICD-10-CM | POA: Diagnosis present

## 2019-07-27 DIAGNOSIS — E039 Hypothyroidism, unspecified: Secondary | ICD-10-CM | POA: Diagnosis present

## 2019-07-27 DIAGNOSIS — K55069 Acute infarction of intestine, part and extent unspecified: Secondary | ICD-10-CM | POA: Diagnosis present

## 2019-07-27 DIAGNOSIS — E440 Moderate protein-calorie malnutrition: Secondary | ICD-10-CM | POA: Diagnosis present

## 2019-07-27 DIAGNOSIS — R109 Unspecified abdominal pain: Secondary | ICD-10-CM

## 2019-07-27 DIAGNOSIS — R627 Adult failure to thrive: Secondary | ICD-10-CM | POA: Diagnosis not present

## 2019-07-27 LAB — COMPREHENSIVE METABOLIC PANEL
ALT: 23 U/L (ref 0–44)
AST: 27 U/L (ref 15–41)
Albumin: 2.7 g/dL — ABNORMAL LOW (ref 3.5–5.0)
Alkaline Phosphatase: 137 U/L — ABNORMAL HIGH (ref 38–126)
Anion gap: 7 (ref 5–15)
BUN: 16 mg/dL (ref 8–23)
CO2: 25 mmol/L (ref 22–32)
Calcium: 8 mg/dL — ABNORMAL LOW (ref 8.9–10.3)
Chloride: 106 mmol/L (ref 98–111)
Creatinine, Ser: 0.99 mg/dL (ref 0.44–1.00)
GFR calc Af Amer: >60 mL/min (ref >60)
GFR calc non Af Amer: 54 mL/min — ABNORMAL LOW (ref >60)
Glucose, Bld: 118 mg/dL — ABNORMAL HIGH (ref 70–99)
Potassium: 4.5 mmol/L (ref 3.5–5.1)
Sodium: 138 mmol/L (ref 135–145)
Total Bilirubin: 0.7 mg/dL (ref 0.3–1.2)
Total Protein: 5.6 g/dL — ABNORMAL LOW (ref 6.5–8.1)

## 2019-07-27 LAB — CBC
HCT: 26.5 % — ABNORMAL LOW (ref 36.0–46.0)
Hemoglobin: 7.8 g/dL — ABNORMAL LOW (ref 12.0–15.0)
MCH: 29.2 pg (ref 26.0–34.0)
MCHC: 29.4 g/dL — ABNORMAL LOW (ref 30.0–36.0)
MCV: 99.3 fL (ref 80.0–100.0)
Platelets: 142 10*3/uL — ABNORMAL LOW (ref 150–400)
RBC: 2.67 MIL/uL — ABNORMAL LOW (ref 3.87–5.11)
RDW: 15.1 % (ref 11.5–15.5)
WBC: 6.5 10*3/uL (ref 4.0–10.5)
nRBC: 0 % (ref 0.0–0.2)

## 2019-07-27 LAB — POC OCCULT BLOOD, ED: Fecal Occult Bld: POSITIVE — AB

## 2019-07-27 MED ORDER — SODIUM CHLORIDE (PF) 0.9 % IJ SOLN
INTRAMUSCULAR | Status: AC
  Filled 2019-07-27: qty 50

## 2019-07-27 MED ORDER — IOHEXOL 300 MG/ML  SOLN
100.0000 mL | Freq: Once | INTRAMUSCULAR | Status: AC | PRN
  Administered 2019-07-27: 100 mL via INTRAVENOUS

## 2019-07-27 MED ORDER — SODIUM CHLORIDE 0.9 % IV SOLN
80.0000 mg | Freq: Once | INTRAVENOUS | Status: AC
  Administered 2019-07-27: 80 mg via INTRAVENOUS
  Filled 2019-07-27: qty 80

## 2019-07-27 NOTE — ED Triage Notes (Signed)
Pt has history of diarrhea, rectal bleeding, had Endo and colonoscopy recently as welll as blood transfusion approx 3 weeks ago
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
## 2019-07-28 ENCOUNTER — Encounter (HOSPITAL_COMMUNITY): Payer: Self-pay | Admitting: Internal Medicine

## 2019-07-28 DIAGNOSIS — R531 Weakness: Secondary | ICD-10-CM | POA: Diagnosis not present

## 2019-07-28 DIAGNOSIS — D649 Anemia, unspecified: Secondary | ICD-10-CM | POA: Diagnosis not present

## 2019-07-28 DIAGNOSIS — K625 Hemorrhage of anus and rectum: Secondary | ICD-10-CM | POA: Diagnosis not present

## 2019-07-28 DIAGNOSIS — K529 Noninfective gastroenteritis and colitis, unspecified: Secondary | ICD-10-CM | POA: Diagnosis present

## 2019-07-28 DIAGNOSIS — L853 Xerosis cutis: Secondary | ICD-10-CM | POA: Diagnosis not present

## 2019-07-28 DIAGNOSIS — J441 Chronic obstructive pulmonary disease with (acute) exacerbation: Secondary | ICD-10-CM | POA: Diagnosis not present

## 2019-07-28 DIAGNOSIS — C259 Malignant neoplasm of pancreas, unspecified: Secondary | ICD-10-CM | POA: Diagnosis present

## 2019-07-28 DIAGNOSIS — D6959 Other secondary thrombocytopenia: Secondary | ICD-10-CM | POA: Diagnosis present

## 2019-07-28 DIAGNOSIS — Z6825 Body mass index (BMI) 25.0-25.9, adult: Secondary | ICD-10-CM | POA: Diagnosis not present

## 2019-07-28 DIAGNOSIS — R32 Unspecified urinary incontinence: Secondary | ICD-10-CM | POA: Diagnosis not present

## 2019-07-28 DIAGNOSIS — E43 Unspecified severe protein-calorie malnutrition: Secondary | ICD-10-CM | POA: Diagnosis not present

## 2019-07-28 DIAGNOSIS — K921 Melena: Secondary | ICD-10-CM | POA: Diagnosis not present

## 2019-07-28 DIAGNOSIS — C786 Secondary malignant neoplasm of retroperitoneum and peritoneum: Secondary | ICD-10-CM | POA: Diagnosis not present

## 2019-07-28 DIAGNOSIS — Z8719 Personal history of other diseases of the digestive system: Secondary | ICD-10-CM | POA: Diagnosis not present

## 2019-07-28 DIAGNOSIS — Z515 Encounter for palliative care: Secondary | ICD-10-CM | POA: Diagnosis not present

## 2019-07-28 DIAGNOSIS — M1990 Unspecified osteoarthritis, unspecified site: Secondary | ICD-10-CM | POA: Diagnosis present

## 2019-07-28 DIAGNOSIS — K219 Gastro-esophageal reflux disease without esophagitis: Secondary | ICD-10-CM | POA: Diagnosis present

## 2019-07-28 DIAGNOSIS — E440 Moderate protein-calorie malnutrition: Secondary | ICD-10-CM | POA: Diagnosis present

## 2019-07-28 DIAGNOSIS — K922 Gastrointestinal hemorrhage, unspecified: Secondary | ICD-10-CM | POA: Diagnosis not present

## 2019-07-28 DIAGNOSIS — G9340 Encephalopathy, unspecified: Secondary | ICD-10-CM | POA: Diagnosis not present

## 2019-07-28 DIAGNOSIS — R410 Disorientation, unspecified: Secondary | ICD-10-CM | POA: Diagnosis not present

## 2019-07-28 DIAGNOSIS — D638 Anemia in other chronic diseases classified elsewhere: Secondary | ICD-10-CM | POA: Diagnosis present

## 2019-07-28 DIAGNOSIS — K55069 Acute infarction of intestine, part and extent unspecified: Secondary | ICD-10-CM | POA: Diagnosis present

## 2019-07-28 DIAGNOSIS — E039 Hypothyroidism, unspecified: Secondary | ICD-10-CM | POA: Diagnosis present

## 2019-07-28 DIAGNOSIS — I341 Nonrheumatic mitral (valve) prolapse: Secondary | ICD-10-CM | POA: Diagnosis present

## 2019-07-28 DIAGNOSIS — H04123 Dry eye syndrome of bilateral lacrimal glands: Secondary | ICD-10-CM | POA: Diagnosis not present

## 2019-07-28 DIAGNOSIS — R1030 Lower abdominal pain, unspecified: Secondary | ICD-10-CM | POA: Diagnosis not present

## 2019-07-28 DIAGNOSIS — R188 Other ascites: Secondary | ICD-10-CM | POA: Diagnosis not present

## 2019-07-28 DIAGNOSIS — D630 Anemia in neoplastic disease: Secondary | ICD-10-CM | POA: Diagnosis not present

## 2019-07-28 DIAGNOSIS — D696 Thrombocytopenia, unspecified: Secondary | ICD-10-CM | POA: Diagnosis not present

## 2019-07-28 DIAGNOSIS — J449 Chronic obstructive pulmonary disease, unspecified: Secondary | ICD-10-CM | POA: Diagnosis not present

## 2019-07-28 DIAGNOSIS — Z741 Need for assistance with personal care: Secondary | ICD-10-CM | POA: Diagnosis not present

## 2019-07-28 DIAGNOSIS — Z7982 Long term (current) use of aspirin: Secondary | ICD-10-CM | POA: Diagnosis not present

## 2019-07-28 DIAGNOSIS — D62 Acute posthemorrhagic anemia: Secondary | ICD-10-CM | POA: Diagnosis present

## 2019-07-28 DIAGNOSIS — Z20822 Contact with and (suspected) exposure to covid-19: Secondary | ICD-10-CM | POA: Diagnosis present

## 2019-07-28 DIAGNOSIS — N3281 Overactive bladder: Secondary | ICD-10-CM | POA: Diagnosis not present

## 2019-07-28 DIAGNOSIS — J302 Other seasonal allergic rhinitis: Secondary | ICD-10-CM | POA: Diagnosis not present

## 2019-07-28 DIAGNOSIS — I10 Essential (primary) hypertension: Secondary | ICD-10-CM | POA: Diagnosis present

## 2019-07-28 DIAGNOSIS — Z7189 Other specified counseling: Secondary | ICD-10-CM | POA: Diagnosis not present

## 2019-07-28 DIAGNOSIS — R180 Malignant ascites: Secondary | ICD-10-CM | POA: Diagnosis not present

## 2019-07-28 DIAGNOSIS — R627 Adult failure to thrive: Secondary | ICD-10-CM | POA: Diagnosis not present

## 2019-07-28 DIAGNOSIS — F329 Major depressive disorder, single episode, unspecified: Secondary | ICD-10-CM | POA: Diagnosis present

## 2019-07-28 DIAGNOSIS — Z66 Do not resuscitate: Secondary | ICD-10-CM | POA: Diagnosis not present

## 2019-07-28 DIAGNOSIS — Z79899 Other long term (current) drug therapy: Secondary | ICD-10-CM | POA: Diagnosis not present

## 2019-07-28 DIAGNOSIS — G629 Polyneuropathy, unspecified: Secondary | ICD-10-CM | POA: Diagnosis present

## 2019-07-28 DIAGNOSIS — K649 Unspecified hemorrhoids: Secondary | ICD-10-CM | POA: Diagnosis present

## 2019-07-28 DIAGNOSIS — H409 Unspecified glaucoma: Secondary | ICD-10-CM | POA: Diagnosis present

## 2019-07-28 LAB — HEMOGLOBIN AND HEMATOCRIT, BLOOD
HCT: 37.5 % (ref 36.0–46.0)
Hemoglobin: 11.9 g/dL — ABNORMAL LOW (ref 12.0–15.0)

## 2019-07-28 LAB — SARS CORONAVIRUS 2 (TAT 6-24 HRS): SARS Coronavirus 2: NEGATIVE

## 2019-07-28 LAB — CBC
HCT: 22.6 % — ABNORMAL LOW (ref 36.0–46.0)
Hemoglobin: 6.8 g/dL — CL (ref 12.0–15.0)
MCH: 30 pg (ref 26.0–34.0)
MCHC: 30.1 g/dL (ref 30.0–36.0)
MCV: 99.6 fL (ref 80.0–100.0)
Platelets: 81 10*3/uL — ABNORMAL LOW (ref 150–400)
RBC: 2.27 MIL/uL — ABNORMAL LOW (ref 3.87–5.11)
RDW: 15.1 % (ref 11.5–15.5)
WBC: 4 10*3/uL (ref 4.0–10.5)
nRBC: 0 % (ref 0.0–0.2)

## 2019-07-28 LAB — COMPREHENSIVE METABOLIC PANEL
ALT: 20 U/L (ref 0–44)
AST: 23 U/L (ref 15–41)
Albumin: 2.5 g/dL — ABNORMAL LOW (ref 3.5–5.0)
Alkaline Phosphatase: 138 U/L — ABNORMAL HIGH (ref 38–126)
Anion gap: 7 (ref 5–15)
BUN: 15 mg/dL (ref 8–23)
CO2: 27 mmol/L (ref 22–32)
Calcium: 8.1 mg/dL — ABNORMAL LOW (ref 8.9–10.3)
Chloride: 107 mmol/L (ref 98–111)
Creatinine, Ser: 0.78 mg/dL (ref 0.44–1.00)
GFR calc Af Amer: >60 mL/min (ref >60)
GFR calc non Af Amer: >60 mL/min (ref >60)
Glucose, Bld: 99 mg/dL (ref 70–99)
Potassium: 4.2 mmol/L (ref 3.5–5.1)
Sodium: 141 mmol/L (ref 135–145)
Total Bilirubin: 0.5 mg/dL (ref 0.3–1.2)
Total Protein: 5.2 g/dL — ABNORMAL LOW (ref 6.5–8.1)

## 2019-07-28 LAB — PROTIME-INR
INR: 1.3 — ABNORMAL HIGH (ref 0.8–1.2)
Prothrombin Time: 16.1 seconds — ABNORMAL HIGH (ref 11.4–15.2)

## 2019-07-28 LAB — MAGNESIUM: Magnesium: 2.3 mg/dL (ref 1.7–2.4)

## 2019-07-28 LAB — PREPARE RBC (CROSSMATCH)

## 2019-07-28 LAB — ABO/RH: ABO/RH(D): A POS

## 2019-07-28 MED ORDER — SODIUM CHLORIDE 0.9% IV SOLUTION: Freq: Once | INTRAVENOUS | Status: DC

## 2019-07-28 MED ORDER — ONDANSETRON HCL 4 MG/2ML IJ SOLN
4.0000 mg | Freq: Four times a day (QID) | INTRAMUSCULAR | Status: DC | PRN
  Administered 2019-07-28: 4 mg via INTRAVENOUS
  Filled 2019-07-28: qty 2

## 2019-07-28 MED ORDER — DULOXETINE HCL 30 MG PO CPEP
60.0000 mg | ORAL_CAPSULE | Freq: Every day | ORAL | Status: DC
  Administered 2019-07-28 – 2019-08-02 (×6): 60 mg via ORAL
  Filled 2019-07-28 (×6): qty 2

## 2019-07-28 MED ORDER — PANCRELIPASE (LIP-PROT-AMYL) 12000-38000 UNITS PO CPEP
24000.0000 [IU] | ORAL_CAPSULE | Freq: Three times a day (TID) | ORAL | Status: DC
  Administered 2019-07-28 – 2019-08-02 (×14): 24000 [IU] via ORAL
  Filled 2019-07-28 (×14): qty 2

## 2019-07-28 MED ORDER — LEVOTHYROXINE SODIUM 25 MCG PO TABS
175.0000 ug | ORAL_TABLET | Freq: Every day | ORAL | Status: DC
  Administered 2019-07-29 – 2019-08-02 (×5): 175 ug via ORAL
  Filled 2019-07-28 (×5): qty 1

## 2019-07-28 MED ORDER — FESOTERODINE FUMARATE ER 4 MG PO TB24
4.0000 mg | ORAL_TABLET | Freq: Every day | ORAL | Status: DC
  Administered 2019-07-28 – 2019-08-02 (×6): 4 mg via ORAL
  Filled 2019-07-28 (×6): qty 1

## 2019-07-28 MED ORDER — TERBINAFINE HCL 250 MG PO TABS: 250.0000 mg | ORAL_TABLET | Freq: Every day | ORAL | Status: DC

## 2019-07-28 MED ORDER — MORPHINE SULFATE (PF) 2 MG/ML IV SOLN
2.0000 mg | INTRAVENOUS | Status: DC | PRN
  Administered 2019-07-29 – 2019-08-01 (×3): 2 mg via INTRAVENOUS
  Filled 2019-07-28 (×3): qty 1

## 2019-07-28 MED ORDER — ROPINIROLE HCL 0.5 MG PO TABS
0.5000 mg | ORAL_TABLET | Freq: Every day | ORAL | Status: DC
  Administered 2019-07-28 – 2019-08-01 (×5): 0.5 mg via ORAL
  Filled 2019-07-28 (×5): qty 1

## 2019-07-28 MED ORDER — PANTOPRAZOLE SODIUM 40 MG IV SOLR
40.0000 mg | Freq: Once | INTRAVENOUS | Status: AC
  Administered 2019-07-28: 40 mg via INTRAVENOUS
  Filled 2019-07-28: qty 40

## 2019-07-28 MED ORDER — CARBOXYMETHYLCELLULOSE SODIUM 0.5 % OP SOLN: 1.0000 [drp] | Freq: Two times a day (BID) | OPHTHALMIC | Status: DC

## 2019-07-28 MED ORDER — SODIUM CHLORIDE 0.9 % IV SOLN: INTRAVENOUS | Status: DC

## 2019-07-28 MED ORDER — ONDANSETRON HCL 4 MG PO TABS
4.0000 mg | ORAL_TABLET | Freq: Four times a day (QID) | ORAL | Status: DC | PRN
  Administered 2019-07-29 (×2): 4 mg via ORAL
  Filled 2019-07-28 (×2): qty 1

## 2019-07-28 MED ORDER — PANTOPRAZOLE SODIUM 40 MG IV SOLR: 40.0000 mg | Freq: Once | INTRAVENOUS | Status: DC

## 2019-07-28 MED ORDER — LATANOPROST 0.005 % OP SOLN
1.0000 [drp] | Freq: Every day | OPHTHALMIC | Status: DC
  Administered 2019-07-28 – 2019-08-01 (×5): 1 [drp] via OPHTHALMIC
  Filled 2019-07-28: qty 2.5

## 2019-07-28 MED ORDER — PREGABALIN 75 MG PO CAPS
150.0000 mg | ORAL_CAPSULE | Freq: Two times a day (BID) | ORAL | Status: DC
  Administered 2019-07-28 – 2019-08-02 (×11): 150 mg via ORAL
  Filled 2019-07-28 (×11): qty 2

## 2019-07-28 MED ORDER — HYDROCODONE-ACETAMINOPHEN 5-325 MG PO TABS
1.0000 | ORAL_TABLET | Freq: Four times a day (QID) | ORAL | Status: DC | PRN
  Administered 2019-07-28 – 2019-08-01 (×6): 1 via ORAL
  Filled 2019-07-28 (×6): qty 1

## 2019-07-28 MED ORDER — BUSPIRONE HCL 5 MG PO TABS
5.0000 mg | ORAL_TABLET | Freq: Two times a day (BID) | ORAL | Status: DC
  Administered 2019-07-28 – 2019-08-02 (×11): 5 mg via ORAL
  Filled 2019-07-28 (×11): qty 1

## 2019-07-28 MED ORDER — FLUTICASONE PROPIONATE 50 MCG/ACT NA SUSP
1.0000 | Freq: Every day | NASAL | Status: DC
  Administered 2019-07-28 – 2019-08-02 (×6): 1 via NASAL
  Filled 2019-07-28: qty 16

## 2019-07-28 MED ORDER — PANTOPRAZOLE SODIUM 40 MG PO TBEC
40.0000 mg | DELAYED_RELEASE_TABLET | Freq: Every day | ORAL | Status: DC
  Administered 2019-07-29 – 2019-08-02 (×5): 40 mg via ORAL
  Filled 2019-07-28 (×5): qty 1

## 2019-07-28 MED ORDER — LEVETIRACETAM 250 MG PO TABS
250.0000 mg | ORAL_TABLET | Freq: Two times a day (BID) | ORAL | Status: DC
  Administered 2019-07-28 – 2019-08-02 (×11): 250 mg via ORAL
  Filled 2019-07-28 (×11): qty 1

## 2019-07-28 MED ORDER — POLYVINYL ALCOHOL 1.4 % OP SOLN
1.0000 [drp] | OPHTHALMIC | Status: DC | PRN
  Filled 2019-07-28: qty 15

## 2019-07-28 MED ORDER — MORPHINE SULFATE (PF) 2 MG/ML IV SOLN
1.0000 mg | INTRAVENOUS | Status: DC | PRN
  Administered 2019-07-28: 1 mg via INTRAVENOUS
  Filled 2019-07-28: qty 1
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
## 2019-07-28 NOTE — ED Notes (Signed)
Pt's signed and held orders are from a previous stay in the hospital
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
## 2019-07-28 NOTE — Progress Notes (Addendum)
[facility] OF CARE NOTE Patient: [name] [id number]   PCP: Leamon Arnt, MD DOB: 08-20-1940   DOA: 07/27/2019   DOS: 07/28/2019    Patient was admitted by my colleague Dr. Humphrey Rolls earlier on 07/28/2019. I have reviewed the H&P as well as assessment and plan and agree with the same. Important changes in the plan are listed below.  Plan of care: Anemia Thrombocytopenia Definitely associated with her chronic GI blood loss. Her platelets are trending down which is likely the cause for patient's ongoing blood loss and suspecting. Etiology of her platelet going down is not clear. We will get further work-up after her transfusion is complete.  Colitis with chronic diarrhea Patient does have colitis evidence on the colonoscopy. Since the biopsy is negative unsure how to proceed here. We will discuss with GI and oncology tomorrow. Patient may benefit from being on some empiric steroids with that would preclude the diagnosis down the road. Patient had C. difficile work-up outpatient which was negative.  Ascites Abdomen is mildly distended. Patient does have some right-sided tenderness where she has colitis. We will perform ultrasound paracentesis again.  Goals of care discussion Patient does not have any advance care planning, nor she has any healthcare proxy or living will or power of attorney. She is also not sure whether she wants to be resuscitated right now or not. Explained in detail regarding what the option are. There is some concern that the patient is having recurrent pancreatic cancer and from the review of the note patient may be started back again on chemotherapy although suspect that the chemotherapy may be too harsh for the patient. We will discuss with oncology tomorrow.  Med rec. Resuming home medication.  Author: Berle Mull, MD [facility] 07/28/2019 2:40 PM   If 7PM-7AM, please contact night-coverage at [email or web address]

## 2019-07-29 ENCOUNTER — Other Ambulatory Visit: Payer: Medicare Other

## 2019-07-29 ENCOUNTER — Other Ambulatory Visit: Payer: Self-pay

## 2019-07-29 ENCOUNTER — Encounter: Payer: Self-pay | Admitting: Oncology

## 2019-07-29 ENCOUNTER — Inpatient Hospital Stay (HOSPITAL_COMMUNITY): Payer: Medicare Other

## 2019-07-29 LAB — BPAM RBC
Blood Product Expiration Date: 202103272359
Blood Product Expiration Date: 202103272359
ISSUE DATE / TIME: 202102280744
ISSUE DATE / TIME: 202102281148
Unit Type and Rh: 6200
Unit Type and Rh: 6200

## 2019-07-29 LAB — COMPREHENSIVE METABOLIC PANEL
ALT: 21 U/L (ref 0–44)
AST: 28 U/L (ref 15–41)
Albumin: 2.6 g/dL — ABNORMAL LOW (ref 3.5–5.0)
Alkaline Phosphatase: 143 U/L — ABNORMAL HIGH (ref 38–126)
Anion gap: 8 (ref 5–15)
BUN: 13 mg/dL (ref 8–23)
CO2: 27 mmol/L (ref 22–32)
Calcium: 8.4 mg/dL — ABNORMAL LOW (ref 8.9–10.3)
Chloride: 103 mmol/L (ref 98–111)
Creatinine, Ser: 0.89 mg/dL (ref 0.44–1.00)
GFR calc Af Amer: >60 mL/min (ref >60)
GFR calc non Af Amer: >60 mL/min (ref >60)
Glucose, Bld: 102 mg/dL — ABNORMAL HIGH (ref 70–99)
Potassium: 4.5 mmol/L (ref 3.5–5.1)
Sodium: 138 mmol/L (ref 135–145)
Total Bilirubin: 0.8 mg/dL (ref 0.3–1.2)
Total Protein: 5.4 g/dL — ABNORMAL LOW (ref 6.5–8.1)

## 2019-07-29 LAB — CBC WITH DIFFERENTIAL/PLATELET
Abs Immature Granulocytes: 0.01 10*3/uL (ref 0.00–0.07)
Abs Immature Granulocytes: 0.01 10*3/uL (ref 0.00–0.07)
Basophils Absolute: 0 10*3/uL (ref 0.0–0.1)
Basophils Absolute: 0 10*3/uL (ref 0.0–0.1)
Basophils Relative: 1 %
Basophils Relative: 1 %
Eosinophils Absolute: 0.3 10*3/uL (ref 0.0–0.5)
Eosinophils Absolute: 0.3 10*3/uL (ref 0.0–0.5)
Eosinophils Relative: 7 %
Eosinophils Relative: 5 %
HCT: 32.6 % — ABNORMAL LOW (ref 36.0–46.0)
HCT: 34 % — ABNORMAL LOW (ref 36.0–46.0)
Hemoglobin: 10.2 g/dL — ABNORMAL LOW (ref 12.0–15.0)
Hemoglobin: 10.7 g/dL — ABNORMAL LOW (ref 12.0–15.0)
Immature Granulocytes: 0 %
Immature Granulocytes: 0 %
Lymphocytes Relative: 20 %
Lymphocytes Relative: 17 %
Lymphs Abs: 0.9 10*3/uL (ref 0.7–4.0)
Lymphs Abs: 0.9 10*3/uL (ref 0.7–4.0)
MCH: 29.6 pg (ref 26.0–34.0)
MCH: 29.3 pg (ref 26.0–34.0)
MCHC: 31.3 g/dL (ref 30.0–36.0)
MCHC: 31.5 g/dL (ref 30.0–36.0)
MCV: 94.5 fL (ref 80.0–100.0)
MCV: 93.2 fL (ref 80.0–100.0)
Monocytes Absolute: 0.5 10*3/uL (ref 0.1–1.0)
Monocytes Absolute: 0.6 10*3/uL (ref 0.1–1.0)
Monocytes Relative: 12 %
Monocytes Relative: 12 %
Neutro Abs: 2.6 10*3/uL (ref 1.7–7.7)
Neutro Abs: 3.5 10*3/uL (ref 1.7–7.7)
Neutrophils Relative %: 60 %
Neutrophils Relative %: 65 %
Platelets: 104 10*3/uL — ABNORMAL LOW (ref 150–400)
Platelets: 116 10*3/uL — ABNORMAL LOW (ref 150–400)
RBC: 3.45 MIL/uL — ABNORMAL LOW (ref 3.87–5.11)
RBC: 3.65 MIL/uL — ABNORMAL LOW (ref 3.87–5.11)
RDW: 15.5 % (ref 11.5–15.5)
RDW: 15.3 % (ref 11.5–15.5)
WBC: 4.4 10*3/uL (ref 4.0–10.5)
WBC: 5.4 10*3/uL (ref 4.0–10.5)
nRBC: 0 % (ref 0.0–0.2)
nRBC: 0 % (ref 0.0–0.2)

## 2019-07-29 LAB — BODY FLUID CELL COUNT WITH DIFFERENTIAL
Lymphs, Fluid: 22 %
Monocyte-Macrophage-Serous Fluid: 60 % (ref 50–90)
Neutrophil Count, Fluid: 18 % (ref 0–25)
Total Nucleated Cell Count, Fluid: 456 cu mm (ref 0–1000)

## 2019-07-29 LAB — PROTEIN, PLEURAL OR PERITONEAL FLUID: Total protein, fluid: <3 g/dL

## 2019-07-29 LAB — TYPE AND SCREEN
ABO/RH(D): A POS
Antibody Screen: NEGATIVE
Unit division: 0
Unit division: 0

## 2019-07-29 LAB — PROTIME-INR
INR: 1.1 (ref 0.8–1.2)
Prothrombin Time: 14.2 seconds (ref 11.4–15.2)

## 2019-07-29 LAB — LACTATE DEHYDROGENASE, PLEURAL OR PERITONEAL FLUID: LD, Fluid: 32 U/L — ABNORMAL HIGH (ref 3–23)

## 2019-07-29 LAB — LACTATE DEHYDROGENASE: LDH: 135 U/L (ref 98–192)

## 2019-07-29 LAB — MAGNESIUM: Magnesium: 2.4 mg/dL (ref 1.7–2.4)

## 2019-07-29 LAB — ALBUMIN, PLEURAL OR PERITONEAL FLUID: Albumin, Fluid: <1 g/dL

## 2019-07-29 LAB — GRAM STAIN

## 2019-07-29 LAB — LACTIC ACID, PLASMA: Lactic Acid, Venous: 1.4 mmol/L (ref 0.5–1.9)

## 2019-07-29 LAB — C-REACTIVE PROTEIN: CRP: 1.7 mg/dL — ABNORMAL HIGH (ref <1.0)

## 2019-07-29 MED ORDER — LIDOCAINE HCL 1 % IJ SOLN
INTRAMUSCULAR | Status: AC
  Filled 2019-07-29: qty 20

## 2019-07-29 NOTE — Progress Notes (Signed)
Pt had large mushy BM with bright red blood. MD notified. Will continue to monitor.

## 2019-07-29 NOTE — Progress Notes (Signed)
Pt's son Armonni Skogen) called for update. RN provided update. No additional questions at this time. Will be here later today to visit pt.

## 2019-07-29 NOTE — Progress Notes (Signed)
[name] 9:31 AM  Subjective: Patient is doing about the same and her case was discussed with the hospital team and she has no new complaints tolerating clear liquids  Objective: Vital signs stable afebrile no acute distress abdomen is soft occasional bowel sounds some mild lower discomfort hemoglobin stable probably some reequilibration other labs okay  Assessment: Multiple medical problems and patient with metastatic pancreatic cancer  Plan: Okay to hold off on GI intervention if paracentesis to be done would specifically get cytology cell count and differential and albumin and please call us if we could be of any further assistance with this hospital stay and consider a trial of Colest[place] in the future for her diarrhea and may slowly advance diet after paracentesis  [facility]  office [phone] After 5PM or if no answer call [phone]

## 2019-07-29 NOTE — Progress Notes (Signed)
[facility] Progress Note  Patient: [name]    [id number]  DOA: 07/27/2019     Date of Service: the patient was seen and examined on 07/29/2019  Chief Complaint  Patient presents with  . Rectal Bleeding   Brief hospital course: Chronic diarrhea, hemorrhoids, pancreatic cancer respiratory postprocedure, depression, GERD, chronic anemia.  Presents with anemia and abdominal distention as well as pain. Currently further plan is treat pain.  Assessment and Plan: Anemia Thrombocytopenia Definitely associated with her chronic GI blood loss. Her platelets are trending down which is likely the cause for patient's ongoing blood loss and suspecting. Etiology of her platelet going down is not clear.  Colitis with chronic diarrhea Patient does have colitis evidence on the colonoscopy. Discussed with GI.  Recommends treatment of current diarrhea outpatient with Colestid possibly. The bleeding could be secondary from Patient may benefit from being on some empiric steroids with that would preclude the diagnosis down the road. Patient had C. difficile work-up outpatient which was negative.  Ascites Abdomen is mildly distended. Patient does have some right-sided tenderness where she has colitis. Paracentesis performed.  Cytology sent out.  Currently does not appear to be any evidence of exudative or infectious fluid.  Goals of care discussion Patient does not have any advance care planning, nor she has any healthcare proxy or living will or power of attorney. She is also not sure whether she wants to be resuscitated right now or not. Explained in detail regarding what the option are. There is some concern that the patient is having recurrent pancreatic cancer and from the review of the note patient may be started back again on chemotherapy although suspect that the chemotherapy may be too harsh for the patient. Appreciate oncology assistance.  Diet: Full liquid diet DVT  Prophylaxis: SCD, pharmacological prophylaxis contraindicated due to Bleeding   Advance goals of care discussion: Full code  Family Communication: no family was present at bedside, at the time of interview.   Disposition:  Pt is from home, admitted with abdominal pain, still has anemia and abdominal pain GI bleed, which precludes a safe discharge. Discharge to home, when medically stable.  Subjective: Still has abdominal pain no nausea no vomiting.  Had a bowel movement with blood.  Physical Exam: General:  alert oriented to time, place, and person.  Appear in moderate distress, affect appropriate Eyes: PERRL ENT: Oral Mucosa Clear, moist  Neck: no JVD,  Cardiovascular: S1 and S2 Present, no Murmur,  Respiratory: good respiratory effort, Bilateral Air entry equal and Decreased, no Crackles, no wheezes Abdomen: Bowel Sound present, Soft and mild tenderness,  Skin: no rash Extremities: no Pedal edema, no calf tenderness Neurologic: without any new focal findings  Gait not checked due to patient safety concerns  Vitals:   07/29/19 0540 07/29/19 1028 07/29/19 1101 07/29/19 1325  BP: (!) 127/95 134/76 129/67 119/60  Pulse: 99   87  Resp: 16   17  Temp: (!) 97.5 F (36.4 C)   98.7 F (37.1 C)  TempSrc: Oral   Oral  SpO2: 94%   97%  Weight:      Height:        Intake/Output Summary (Last 24 hours) at 07/29/2019 1923 Last data filed at 07/29/2019 1713 Gross per 24 hour  Intake 360 ml  Output 0 ml  Net 360 ml   Filed Weights   07/27/19 1854  Weight: 62.1 kg    Data Reviewed: I have personally reviewed and interpreted daily labs,  tele strips, imagings as discussed above. I reviewed all nursing notes, pharmacy notes, vitals, pertinent old records I have discussed plan of care as described above with RN and patient/family.  CBC: Recent Labs  Lab 07/27/19 1918 07/28/19 0445 07/28/19 1717 07/29/19 0554 07/29/19 1723  WBC 6.5 4.0  --  4.4 5.4  NEUTROABS  --   --   --   2.6 3.5  HGB 7.8* 6.8* 11.9* 10.2* 10.7*  HCT 26.5* 22.6* 37.5 32.6* 34.0*  MCV 99.3 99.6  --  94.5 93.2  PLT 142* 81*  --  104* [id number]*   Basic Metabolic Panel: Recent Labs  Lab 07/27/19 1918 07/28/19 0445 07/29/19 0554  NA 138 141 138  K 4.5 4.2 4.5  CL 106 107 103  CO2 25 27 27   GLUCOSE 118* 99 102*  BUN 16 15 13   CREATININE 0.99 0.78 0.89  CALCIUM 8.0* 8.1* 8.4*  MG 2.3  --  2.4    Studies: US Paracentesis  Result Date: 07/29/2019 INDICATION: Patient with history of pancreatic cancer, recent GI bleed, colitis, recurrent ascites. Request made for diagnostic and therapeutic paracentesis. EXAM: ULTRASOUND GUIDED DIAGNOSTIC AND THERAPEUTIC PARACENTESIS MEDICATIONS: None COMPLICATIONS: None immediate. PROCEDURE: Informed written consent was obtained from the patient after a discussion of the risks, benefits and alternatives to treatment. A timeout was performed prior to the initiation of the procedure. Initial ultrasound scanning demonstrates a small amount of ascites within the right lower abdominal quadrant. The right lower abdomen was prepped and draped in the usual sterile fashion. 1% lidocaine was used for local anesthesia. Following this, a 19 gauge, 10-cm, Yueh catheter was introduced. An ultrasound image was saved for documentation purposes. The paracentesis was performed. The catheter was removed and a dressing was applied. The patient tolerated the procedure well without immediate post procedural complication. FINDINGS: A total of approximately 820 cc of light yellow fluid was removed. Samples were sent to the laboratory as requested by the clinical team. IMPRESSION: Successful ultrasound-guided diagnostic and therapeutic paracentesis yielding 820 cc of peritoneal fluid. Read by: Rowe Robert, PA-C Electronically Signed   By: Sandi Mariscal M.D.   On: 07/29/2019 11:07    Scheduled Meds: . sodium chloride   Intravenous Once  . busPIRone  5 mg Oral BID  . DULoxetine  60 mg Oral Daily   . fesoterodine  4 mg Oral Daily  . fluticasone  1 spray Each Nare Daily  . latanoprost  1 drop Both Eyes QHS  . levETIRAcetam  250 mg Oral BID  . levothyroxine  175 mcg Oral QAC breakfast  . lidocaine      . lipase/protease/amylase  24,000 Units Oral TID WC  . pantoprazole  40 mg Oral Daily  . pregabalin  150 mg Oral BID  . rOPINIRole  0.5 mg Oral QHS   Continuous Infusions: PRN Meds: HYDROcodone-acetaminophen, morphine injection, ondansetron **OR** ondansetron (ZOFRAN) IV, polyvinyl alcohol  Time spent: 35 minutes  Author: Berle Mull, MD [facility] 07/29/2019 7:23 PM  To reach On-call, see care teams to locate the attending and reach out to them via [email or web address]. If 7PM-7AM, please contact night-coverage If you still have difficulty reaching the attending provider, please page the [facility] (Director on Call) for [facility] on amion for assistance.

## 2019-07-29 NOTE — Procedures (Signed)
Ultrasound-guided diagnostic and therapeutic paracentesis performed yielding 820 cc of light yellow fluid. No immediate complications. The fluid was sent to the lab for preordered studies. EBL< 1 cc.

## 2019-07-30 DIAGNOSIS — K922 Gastrointestinal hemorrhage, unspecified: Secondary | ICD-10-CM

## 2019-07-30 LAB — BASIC METABOLIC PANEL
Anion gap: 10 (ref 5–15)
BUN: 16 mg/dL (ref 8–23)
CO2: 25 mmol/L (ref 22–32)
Calcium: 8.4 mg/dL — ABNORMAL LOW (ref 8.9–10.3)
Chloride: 103 mmol/L (ref 98–111)
Creatinine, Ser: 0.87 mg/dL (ref 0.44–1.00)
GFR calc Af Amer: >60 mL/min (ref >60)
GFR calc non Af Amer: >60 mL/min (ref >60)
Glucose, Bld: 98 mg/dL (ref 70–99)
Potassium: 4.4 mmol/L (ref 3.5–5.1)
Sodium: 138 mmol/L (ref 135–145)

## 2019-07-30 LAB — CBC
HCT: 31.1 % — ABNORMAL LOW (ref 36.0–46.0)
Hemoglobin: 9.5 g/dL — ABNORMAL LOW (ref 12.0–15.0)
MCH: 30 pg (ref 26.0–34.0)
MCHC: 30.5 g/dL (ref 30.0–36.0)
MCV: 98.1 fL (ref 80.0–100.0)
Platelets: 110 10*3/uL — ABNORMAL LOW (ref 150–400)
RBC: 3.17 MIL/uL — ABNORMAL LOW (ref 3.87–5.11)
RDW: 15.4 % (ref 11.5–15.5)
WBC: 4.2 10*3/uL (ref 4.0–10.5)
nRBC: 0 % (ref 0.0–0.2)

## 2019-07-30 LAB — PROTIME-INR
INR: 1.1 (ref 0.8–1.2)
Prothrombin Time: 14 seconds (ref 11.4–15.2)

## 2019-07-30 LAB — MAGNESIUM: Magnesium: 2.2 mg/dL (ref 1.7–2.4)

## 2019-07-30 MED ORDER — ALPRAZOLAM 0.25 MG PO TABS
0.2500 mg | ORAL_TABLET | Freq: Three times a day (TID) | ORAL | Status: DC | PRN
  Administered 2019-07-31 – 2019-08-01 (×2): 0.25 mg via ORAL
  Filled 2019-07-30 (×2): qty 1

## 2019-07-30 MED ORDER — METOPROLOL TARTRATE 25 MG PO TABS
25.0000 mg | ORAL_TABLET | Freq: Two times a day (BID) | ORAL | Status: DC
  Administered 2019-07-30 – 2019-08-02 (×6): 25 mg via ORAL
  Filled 2019-07-30 (×7): qty 1

## 2019-07-30 MED ORDER — HYDROCORTISONE ACETATE 25 MG RE SUPP
25.0000 mg | Freq: Two times a day (BID) | RECTAL | Status: DC
  Administered 2019-07-30 – 2019-08-02 (×4): 25 mg via RECTAL
  Filled 2019-07-30 (×7): qty 1

## 2019-07-30 NOTE — Progress Notes (Addendum)
HEMATOLOGY-ONCOLOGY PROGRESS NOTE  SUBJECTIVE: The patient was admitted secondary to GI bleed.  She is status post 2 units PRBCs this admission.  Has been seen by gastroenterology who is holding off on any GI intervention at this time.  She is status post paracentesis by interventional radiology on 07/29/2019.  820 cc of fluid was removed.  The fluid has been sent for cytology which is currently pending.  This morning, she still reports ongoing abdominal discomfort particularly in the right upper quadrant.  Still having dark stools.  PHYSICAL EXAMINATION:  Vitals:   07/29/19 2021 07/30/19 0516  BP: 127/75 (!) 148/82  Pulse: (!) 104 (!) 105  Resp: 20 17  Temp: 98.3 F (36.8 C) 98.1 F (36.7 C)  SpO2: 98% 98%   Filed Weights   07/27/19 1854  Weight: 136 lb 14.5 oz (62.1 kg)    Intake/Output from previous day: 03/01 0701 - 03/02 0700 In: 360 [P.O.:360] Out: 0   GENERAL:alert, no distress and comfortable SKIN: skin color, texture, turgor are normal, no rashes or significant lesions LUNGS: clear to auscultation and percussion with normal breathing effort HEART: regular rate & rhythm and no murmurs and no lower extremity edema ABDOMEN: Positive bowel sounds, tenderness in the right upper quadrant with palpation Musculoskeletal:no cyanosis of digits and no clubbing  NEURO: alert & oriented x 3 with fluent speech, no focal motor/sensory deficits  LABORATORY DATA:  I have reviewed the data as listed CMP Latest Ref Rng & Units 07/30/2019 07/29/2019 07/28/2019  Glucose 70 - 99 mg/dL 98 102(H) 99  BUN 8 - 23 mg/dL 16 13 15   Creatinine 0.44 - 1.00 mg/dL 0.87 0.89 0.78  Sodium 135 - 145 mmol/L 138 138 141  Potassium 3.5 - 5.1 mmol/L 4.4 4.5 4.2  Chloride 98 - 111 mmol/L 103 103 107  CO2 22 - 32 mmol/L 25 27 27   Calcium 8.9 - 10.3 mg/dL 8.4(L) 8.4(L) 8.1(L)  Total Protein 6.5 - 8.1 g/dL - 5.4(L) 5.2(L)  Total Bilirubin 0.3 - 1.2 mg/dL - 0.8 0.5  Alkaline Phos 38 - 126 U/L - 143(H) 138(H)   AST 15 - 41 U/L - 28 23  ALT 0 - 44 U/L - 21 20    Lab Results  Component Value Date   WBC 4.2 07/30/2019   HGB 9.5 (L) 07/30/2019   HCT 31.1 (L) 07/30/2019   MCV 98.1 07/30/2019   PLT 110 (L) 07/30/2019   NEUTROABS 3.5 07/29/2019    CT ABDOMEN PELVIS W CONTRAST  Result Date: 07/27/2019 CLINICAL DATA:  diarrhea and rectal bleeding, question of diverticulitis EXAM: CT ABDOMEN AND PELVIS WITH CONTRAST TECHNIQUE: Multidetector CT imaging of the abdomen and pelvis was performed using the standard protocol following bolus administration of intravenous contrast. CONTRAST:  117mL OMNIPAQUE IOHEXOL 300 MG/ML  SOLN COMPARISON:  June 11, 2019 FINDINGS: Lower chest: The visualized heart size within normal limits. No pericardial fluid/thickening. Again noted is calcifications of the mitral valve and coronary artery calcifications. There is streaky atelectasis seen at both lung bases. Hepatobiliary: There is diffuse low density seen throughout the liver parenchyma. Again noted is extensive pneumobilia. The portal vein appears to be patent the patient is status post cholecystectomy. Pancreas: The patient is status post Whipple with atrophy of the pancreatic body and surgical clips around the diminutive pancreatic head. Spleen: Normal in size without focal abnormality. Adrenals/Urinary Tract: Both adrenal glands appear normal. The kidneys and collecting system appear normal without evidence of urinary tract calculus or hydronephrosis. Bladder is  unremarkable. Stomach/Bowel: Again noted is a prior gastrojejunostomy. The remainder of the small bowel is unremarkable. There is diffuse wall thickening seen surrounding the right colon especially at the hepatic flexure with surrounding mild fat stranding changes, best seen on series 2, image 43. There is extensive sigmoid colonic diverticulosis. No pericolonic free fluid or free air is seen. There appears to be mild wall thickening seen around the anorectal junction  with mild perirectal edema. Vascular/Lymphatic: There is a small to moderate amount of abdominopelvic ascites. Scattered aortic atherosclerosis is noted. Again noted is a soft tissue density which surrounds the SMA and SMV at the level of the mid abdomen, series 2, image 45 which is not significantly changed since the prior exam. The SMA, however does appear to be patent. The SMV however terminates within this soft tissue density and appears to be occluded. Reproductive: The patient is status post hysterectomy. No adnexal masses or collections seen. Other: A small anterior umbilical hernia seen which contains a small amount of ascitic fluid. Musculoskeletal: There is a healed right inferior pubic rami fracture. Diffuse osteopenia. Posterior lumbar spine fixation is noted from L3 through S1. A chronic superior compression deformity of the L2 vertebral body seen with less than 25% loss in height. There is also chronic superior compression deformities of the T7 and T10 vertebral bodies with 30 to 40% loss in vertebral body height. IMPRESSION: 1. Again noted is findings suggestive of colitis/wall thickening involving the hepatic flexure and proximal right colon. 2. Diffuse wall thickening seen at the anorectal junction which could be due to proctocolitis. 3. Unchanged soft tissue density which encases the SMA and occludes the SMV at the level of the mid abdomen. 4. Hepatic steatosis and unchanged pneumobilia. 5. Small to moderate abdominopelvic ascites. 6. Extensive diverticulosis. 7.  Aortic Atherosclerosis (ICD10-I70.0). 8. Unchanged chronic compression deformities thoracic and lumbar spine and healed fracture deformity of the right inferior pubic rami. Electronically Signed   By: Prudencio Pair M.D.   On: 07/27/2019 21:58   US Paracentesis  Result Date: 07/29/2019 INDICATION: Patient with history of pancreatic cancer, recent GI bleed, colitis, recurrent ascites. Request made for diagnostic and therapeutic  paracentesis. EXAM: ULTRASOUND GUIDED DIAGNOSTIC AND THERAPEUTIC PARACENTESIS MEDICATIONS: None COMPLICATIONS: None immediate. PROCEDURE: Informed written consent was obtained from the patient after a discussion of the risks, benefits and alternatives to treatment. A timeout was performed prior to the initiation of the procedure. Initial ultrasound scanning demonstrates a small amount of ascites within the right lower abdominal quadrant. The right lower abdomen was prepped and draped in the usual sterile fashion. 1% lidocaine was used for local anesthesia. Following this, a 19 gauge, 10-cm, Yueh catheter was introduced. An ultrasound image was saved for documentation purposes. The paracentesis was performed. The catheter was removed and a dressing was applied. The patient tolerated the procedure well without immediate post procedural complication. FINDINGS: A total of approximately 820 cc of light yellow fluid was removed. Samples were sent to the laboratory as requested by the clinical team. IMPRESSION: Successful ultrasound-guided diagnostic and therapeutic paracentesis yielding 820 cc of peritoneal fluid. Read by: Rowe Robert, PA-C Electronically Signed   By: Sandi Mariscal M.D.   On: 07/29/2019 11:07   US Paracentesis  Result Date: 07/22/2019 INDICATION: Patient with history of pancreatic cancer presents for diagnostic and therapeutic paracentesis. EXAM: ULTRASOUND GUIDED DIAGNOSTIC AND THERAPEUTIC PARACENTESIS MEDICATIONS: 5 mL 1% lidocaine COMPLICATIONS: None immediate. PROCEDURE: Informed written consent was obtained from the patient after a discussion of the  risks, benefits and alternatives to treatment. A timeout was performed prior to the initiation of the procedure. Initial ultrasound scanning demonstrates a small amount of ascites within the left lateral abdomen. The left lateral abdomen was prepped and draped in the usual sterile fashion. 1% lidocaine was used for local anesthesia. Following this, a  19 gauge, 7-cm, Yueh catheter was introduced. An ultrasound image was saved for documentation purposes. The paracentesis was performed. The catheter was removed and a dressing was applied. The patient tolerated the procedure well without immediate post procedural complication. FINDINGS: A total of approximately 300 mL of pale, yellow fluid was removed. Samples were sent to the laboratory as requested by the clinical team. IMPRESSION: Successful ultrasound-guided diagnostic and therapeutic paracentesis yielding 300 mL liters of peritoneal fluid. Read by: Brynda Greathouse PA-C Electronically Signed   By: Jacqulynn Cadet M.D.   On: 07/22/2019 12:37    ASSESSMENT AND PLAN: 1. 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  CT abdomen/pelvis 01/19/2015 with no evidence of recurrent/metastatic disease.  CT abdomen/pelvis 07/20/2018-infiltrating soft tissue at the pancreas resection bed with involvement of the SMA, occlusion of the SMV with collaterals. New L2 compression fracture  SBRT beginning 08/21/2018, completed 08/31/2018, 5 fractions  CT 12/03/2018-no change in abnormal soft tissue encasing the SMA  CT 03/27/2019-new wall thickening and mucosal enhancement in the sigmoid and rectum, hepatic flexure wall thickening has resolved new or progressive ascending colonic wall thickening. Persistent soft tissue surrounding the SMA-stable, no ascites or evidence of peritoneal metastases, enlarging ventral hernias  CT abdomen/pelvis 06/11/2019-ascending colitis, enlarging soft tissue surrounding the SMV and SMA with occlusion of the SMV and ascites, diverticulosis  2. Nausea  following the Whipple procedure-improved 3. Mild thrombocytopenia , Chronic  4.Pain secondary to local recurrence of pancreas cancer 5. Diarrhea-etiology unclear, Giardia antigen positive 03/21/2019  Ms. Nakao is now admitted due to bleed.  She has received 2 units PRBCs this admission with improvement of her hemoglobin, but is slowly drifting back down.  GI has consulted and has not recommended any procedures.  Still having dark stools.  Patient has locally advanced pancreatic cancer.  Will await the results of cytology from paracentesis on 07/29/2019.  Briefly discussed consideration of hospice with the patient this morning and will discuss further with her son.  Recommendations: 1.  We will monitor CBC daily and transfuse for hemoglobin less than 8 in the setting of slow GI bleed 2.  Await results of cytology. 3.  We will contact son to discuss her prognosis and for consideration of hospice the near future.   LOS: 2 days   Mikey Bussing, DNP, AGPCNP-BC, AOCNP 07/30/19 Ms. Fluegge is admitted with symptomatic anemia secondary to GI bleeding.  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  She has a history of diarrhea for many months, also potentially related to the SMV thrombus.  Multiple medical interventions have not helped the diarrhea.  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  Treatment options are limited.  I do not feel a trial of systemic chemotherapy will improve her symptoms.  Cytology from a paracentesis last month was negative.  Cytology  from a paracentesis yesterday is pending.  I discussed the current situation and prognosis with Ms. Phineas Douglas.  She is a candidate for hospice care.  We will continue discussions with Ms. Poppell and her son regarding home hospice care.  Thrombocytopenia is chronic and not a cause of the GI bleeding.  The bedside RN feels Ms.  Raney may benefit from an anxiolytic.  I will add low-dose Xanax.

## 2019-07-30 NOTE — Progress Notes (Signed)
.    Vital Signs MEWS/VS Documentation      07/30/2019 1005 07/30/2019 1401 07/30/2019 1626 07/30/2019 1820   MEWS Score:  1  2  2   0   MEWS Score Color:  Green  Yellow  Yellow  Green   Resp:  --  16  14  14    Pulse:  --  (!) 116  (!) 112  88   BP:  --  126/80  125/82  128/69   Temp:  --  98.1 F (36.7 C)  97.9 F (36.6 C)  98.6 F (37 C)   O2 Device:  --  Room Air  Room Air  Room Air   Level of Consciousness:  Alert  Alert  Alert  Alert       Metoprolol given at 1637. Tachycardia resolved. Patient relaxing comfortable in bed.    Journiee Feldkamp P Allante Whitmire 07/30/2019,6:42 PM

## 2019-07-30 NOTE — Progress Notes (Signed)
[facility] Progress Note  Patient: [name]    [id number]  DOA: 07/27/2019     Date of Service: the patient was seen and examined on 07/30/2019  Chief Complaint  Patient presents with  . Rectal Bleeding   Brief hospital course: Chronic diarrhea, hemorrhoids, pancreatic cancer respiratory postprocedure, depression, GERD, chronic anemia.  Presents with anemia and abdominal distention as well as pain. Currently further plan is monitor CBC, work on goals of care discussion.  Assessment and Plan: Anemia Thrombocytopenia Definitely associated with her chronic GI blood loss. Her platelets are trending down which is likely the cause for patient's ongoing blood loss and suspecting. Etiology of her platelet going down is not clear.  Colitis with chronic diarrhea Hemorrhoids Patient does have colitis evidence on the colonoscopy. Discussed with GI.  Recommends treatment of current diarrhea outpatient with Colestid possibly. The bleeding could be secondary from hemorrhoids Patient had C. difficile work-up outpatient which was negative. Hemorrhoids are seen congested on prior colonoscopy.  We will add Anusol suppository.  Appreciate GI assistance.  Ascites Abdomen is mildly distended. Patient does have some right-sided tendernesswhereshe hascolitis. Paracentesis performed.  Cytology sent out.  Currently does not appear to be any evidence of SBP, exudative or infectious fluid.  Goals of care discussion History of pancreatic cancer respiratory postprocedural Patient does not have any advance care planning, nor she has any healthcare proxy or living will or power of attorney. She is also not sure whether she wants to be resuscitated right now or not.Explained in detail regarding what the option are. There is some concern that the patient is having recurrent pancreatic cancer and from the review of the note patient may be started back again on chemotherapy although suspect  that the chemotherapy may be too harsh for the patient. Appreciate oncology assistance. Palliative care consulted.  Essential hypertension. Atenolol was hold on admission, currently transition to Lopressor for short-acting effect.  Diet: Full liquid diet secondary to vomiting episode this morning DVT Prophylaxis: SCD, pharmacological prophylaxis contraindicated due to GI bleed   Advance goals of care discussion: Full code  Family Communication: no family was present at bedside, at the time of interview.  The pt provided permission to discuss medical plan with the family. Opportunity was given to ask question and all questions were answered satisfactorily.   Disposition:  Pt is from home, admitted with anemia, still has slow bleeding with anemia, which precludes a safe discharge. Discharge to be determined, when goals of care are clear.  Subjective: Reports abdominal pain.  Reports an episode of vomiting this morning.  Currently no nausea.  No dizziness or lightheadedness. Later in the afternoon her heart rate elevated.  Physical Exam: General:  alert oriented to time, place, and person.  Appear in mild distress, affect flat in affect Eyes: PERRL ENT: Oral Mucosa Clear, moist  Neck: no JVD,  Cardiovascular: S1 and S2 Present, no Murmur,  Respiratory: good respiratory effort, Bilateral Air entry equal and Decreased, no Crackles, no wheezes Abdomen: Bowel Sound present, Soft and mild tenderness,  Skin: no rash Extremities: no Pedal edema, no calf tenderness Neurologic: without any new focal findings  Gait not checked due to patient safety concerns  Vitals:   07/29/19 2021 07/30/19 0516 07/30/19 1401 07/30/19 1626  BP: 127/75 (!) 148/82 126/80 125/82  Pulse: (!) 104 (!) 105 (!) 116 (!) 112  Resp: 20 17 16 14   Temp: 98.3 F (36.8 C) 98.1 F (36.7 C) 98.1 F (36.7 C) 97.9 F (  36.6 C)  TempSrc:  Oral Oral Oral  SpO2: 98% 98% 99% 96%  Weight:      Height:         Intake/Output Summary (Last 24 hours) at 07/30/2019 1737 Last data filed at 07/30/2019 1729 Gross per 24 hour  Intake 750 ml  Output --  Net 750 ml   Filed Weights   07/27/19 1854  Weight: 62.1 kg    Data Reviewed: I have personally reviewed and interpreted daily labs, tele strips, imagings as discussed above. I reviewed all nursing notes, pharmacy notes, vitals, pertinent old records I have discussed plan of care as described above with RN and patient/family.  CBC: Recent Labs  Lab 07/27/19 1918 07/27/19 1918 07/28/19 0445 07/28/19 1717 07/29/19 0554 07/29/19 1723 07/30/19 0505  WBC 6.5  --  4.0  --  4.4 5.4 4.2  NEUTROABS  --   --   --   --  2.6 3.5  --   HGB 7.8*   < > 6.8* 11.9* 10.2* 10.7* 9.5*  HCT 26.5*   < > 22.6* 37.5 32.6* 34.0* 31.1*  MCV 99.3  --  99.6  --  94.5 93.2 98.1  PLT 142*  --  81*  --  104* 116* 110*   < > = values in this interval not displayed.   Basic Metabolic Panel: Recent Labs  Lab 07/27/19 1918 07/28/19 0445 07/29/19 0554 07/30/19 0505  NA 138 141 138 138  K 4.5 4.2 4.5 4.4  CL 106 107 103 103  CO2 25 27 27 25   GLUCOSE 118* 99 102* 98  BUN 16 15 13 16   CREATININE 0.99 0.78 0.89 0.87  CALCIUM 8.0* 8.1* 8.4* 8.4*  MG 2.3  --  2.4 2.2    Studies: No results found.  Scheduled Meds: . sodium chloride   Intravenous Once  . busPIRone  5 mg Oral BID  . DULoxetine  60 mg Oral Daily  . fesoterodine  4 mg Oral Daily  . fluticasone  1 spray Each Nare Daily  . hydrocortisone  25 mg Rectal BID  . latanoprost  1 drop Both Eyes QHS  . levETIRAcetam  250 mg Oral BID  . levothyroxine  175 mcg Oral QAC breakfast  . lipase/protease/amylase  24,000 Units Oral TID WC  . metoprolol tartrate  25 mg Oral BID  . pantoprazole  40 mg Oral Daily  . pregabalin  150 mg Oral BID  . rOPINIRole  0.5 mg Oral QHS   Continuous Infusions: PRN Meds: ALPRAZolam, HYDROcodone-acetaminophen, morphine injection, ondansetron **OR** ondansetron (ZOFRAN) IV,  polyvinyl alcohol  Time spent: 35 minutes  Author: Berle Mull, MD [facility] 07/30/2019 5:37 PM  To reach On-call, see care teams to locate the attending and reach out to them via [email or web address]. If 7PM-7AM, please contact night-coverage If you still have difficulty reaching the attending provider, please page the [facility] (Director on Call) for [facility] on amion for assistance.

## 2019-07-30 NOTE — Progress Notes (Signed)
[name] 11:22 AM  Subjective: Patient's case again discussed with the hospital team and her last colonoscopy was reviewed and she is scared more than any other complaint which we discussed and she has some old blood today in her bowels but no bright red and no new complaints and the paracentesis did help a little but her belly pain is still there Objective: Vital signs stable afebrile no acute distress abdomen is soft nontender hemoglobin slight decrease other labs okay paracentesis it was transudate await repeat cytology mostly monocytes and fluid Assessment: Multiple medical problems including subacute GI blood loss in a patient with metastatic cancer   Plan: We will check on tomorrow we will treat hemorrhoids per hospital team no obvious further GI work-up at this time  [facility]  office [phone] After 5PM or if no answer call [phone]

## 2019-07-30 NOTE — Progress Notes (Signed)
Patient in yellow MEWS d/t elevated pulse, patient is asymptomatic. MEWS protocol initiated. MD made aware via secure chart. Will continue to monitor patient.

## 2019-07-31 ENCOUNTER — Telehealth: Payer: Self-pay | Admitting: Nurse Practitioner

## 2019-07-31 DIAGNOSIS — D649 Anemia, unspecified: Secondary | ICD-10-CM

## 2019-07-31 DIAGNOSIS — Z515 Encounter for palliative care: Secondary | ICD-10-CM

## 2019-07-31 DIAGNOSIS — Z7189 Other specified counseling: Secondary | ICD-10-CM

## 2019-07-31 LAB — CBC
HCT: 28.9 % — ABNORMAL LOW (ref 36.0–46.0)
Hemoglobin: 9 g/dL — ABNORMAL LOW (ref 12.0–15.0)
MCH: 29.9 pg (ref 26.0–34.0)
MCHC: 31.1 g/dL (ref 30.0–36.0)
MCV: 96 fL (ref 80.0–100.0)
Platelets: 116 10*3/uL — ABNORMAL LOW (ref 150–400)
RBC: 3.01 MIL/uL — ABNORMAL LOW (ref 3.87–5.11)
RDW: 14.9 % (ref 11.5–15.5)
WBC: 5 10*3/uL (ref 4.0–10.5)
nRBC: 0 % (ref 0.0–0.2)

## 2019-07-31 LAB — BASIC METABOLIC PANEL
Anion gap: 8 (ref 5–15)
BUN: 16 mg/dL (ref 8–23)
CO2: 26 mmol/L (ref 22–32)
Calcium: 8.1 mg/dL — ABNORMAL LOW (ref 8.9–10.3)
Chloride: 103 mmol/L (ref 98–111)
Creatinine, Ser: 0.83 mg/dL (ref 0.44–1.00)
GFR calc Af Amer: >60 mL/min (ref >60)
GFR calc non Af Amer: >60 mL/min (ref >60)
Glucose, Bld: 98 mg/dL (ref 70–99)
Potassium: 4.3 mmol/L (ref 3.5–5.1)
Sodium: 137 mmol/L (ref 135–145)

## 2019-07-31 LAB — CYTOLOGY - NON PAP

## 2019-07-31 MED ORDER — LIP MEDEX EX OINT
TOPICAL_OINTMENT | CUTANEOUS | Status: DC | PRN
  Filled 2019-07-31: qty 7

## 2019-07-31 NOTE — Progress Notes (Signed)
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 Presents with symptomatic secondary to GI bleed.  Seen by oncology.  She is a candidate for hospice.  Ongoing discussion between oncology and son regarding home hospice care.  07/31/19: Seen and examined.  Reports abd pain 8/10.  No nausea.  Assessment/Plan: Principal Problem:   Acute GI bleeding Active Problems:   Fatigue associated with anemia   Colitis  Symptomatic Anemia Thrombocytopenia Likely related to her malignancy Hg drop 9.0 No active sign of bleeding this AM Continue to monitor H&H  Colitis with chronic diarrhea Hemorrhoids Patient does have colitis evidence on the colonoscopy. Discussed with GI. Recommends treatment of current diarrhea outpatient with Colestid possibly. The bleeding could be secondary from hemorrhoids Patient had C. difficile work-up outpatient which was negative. Anusol suppository.  Appreciate GI assistance.  Ascites post paracentesis 3/1 Body fluid gram stain and culture negative thus far, continue to follow Cytology sent out.   Goals of care discussion History of pancreatic cancer with recurrence and obstruction of SMV Hospice candidate per oncology Ongoing discussion oncology and patient's son regarding home with hospice care.  Essential hypertension. BP at goal C/w po lopressor 25 mg BID  Diet: Full liquid diet, can advance as tolerated DVT Prophylaxis: SCD, pharmacological prophylaxis contraindicated due to GI bleed   Advance goals of care discussion: Full code; ongoing discussion with oncology, patient and son.  Family Communication: Planning to call her son for updates.   Disposition:  Pt is from home, admitted  with symptomatic anemia.  Plan to dc home with hospice care if decided by patient and family.   Objective: Vitals:   07/30/19 1820 07/30/19 2106 07/30/19 2206 07/31/19 0231  BP: 128/69 134/78 132/65 135/64  Pulse: 88 81 90 90  Resp: 14  18 16   Temp: 98.6 F (37 C) 97.7 F (36.5 C) 98.3 F (36.8 C) 97.6 F (36.4 C)  TempSrc: Oral Oral    SpO2: 97% 100% 97% 98%  Weight:      Height:        Intake/Output Summary (Last 24 hours) at 07/31/2019 1036 Last data filed at 07/31/2019 0830 Gross per 24 hour  Intake 290 ml  Output 1 ml  Net 289 ml   Filed Weights   07/27/19 1854  Weight: 62.1 kg    Exam:  . General: 79 y.o. year-old female well developed well nourished in no acute distress.  Alert and interactive. . Cardiovascular: Regular rate and rhythm with no rubs or gallops.  No thyromegaly or JVD noted.   Marland Kitchen Respiratory: Clear to auscultation with no wheezes or rales. Good inspiratory effort. . Abdomen: Soft diffused tenderness with palpation.  Bowel sounds present. . Musculoskeletal: No lower extremity edema. 2/4 pulses in all 4 extremities. Marland Kitchen Psychiatry: Mood is appropriate for condition and setting   Data Reviewed: CBC: Recent Labs  Lab 07/28/19 0445 07/28/19 0445 07/28/19 1717 07/29/19 0554 07/29/19 1723 07/30/19 0505 07/31/19 0542  WBC 4.0  --   --  4.4 5.4 4.2 5.0  NEUTROABS  --   --   --  2.6 3.5  --   --   HGB 6.8*   < > 11.9* 10.2* 10.7* 9.5* 9.0*  HCT 22.6*   < > 37.5 32.6* 34.0* 31.1* 28.9*  MCV 99.6  --   --  94.5 93.2 98.1 96.0  PLT 81*  --   --  104* 116* 110* 116*   < > = values in this interval not displayed.   Basic Metabolic Panel: Recent Labs  Lab 07/27/19 1918 07/28/19 0445 07/29/19 0554 07/30/19 0505 07/31/19 0542  NA 138 141 138 138 137  K 4.5 4.2 4.5 4.4 4.3  CL 106 107 103 103 103  CO2 25 27 27 25 26   GLUCOSE 118* 99 102* 98 98  BUN 16 15 13 16 16   CREATININE 0.99 0.78 0.89 0.87 0.83  CALCIUM 8.0* 8.1* 8.4* 8.4* 8.1*  MG 2.3   --  2.4 2.2  --    GFR: Estimated Creatinine Clearance: 46.4 mL/min (by C-G formula based on SCr of 0.83 mg/dL). Liver Function Tests: Recent Labs  Lab 07/27/19 1918 07/28/19 0445 07/29/19 0554  AST 27 23 28   ALT 23 20 21   ALKPHOS 137* 138* 143*  BILITOT 0.7 0.5 0.8  PROT 5.6* 5.2* 5.4*  ALBUMIN 2.7* 2.5* 2.6*   No results for input(s): LIPASE, AMYLASE in the last 168 hours. No results for input(s): AMMONIA in the last 168 hours. Coagulation Profile: Recent Labs  Lab 07/28/19 0445 07/29/19 0554 07/30/19 0505  INR 1.3* 1.1 1.1   Cardiac Enzymes: No results for input(s): CKTOTAL, CKMB, CKMBINDEX, TROPONINI in the last 168 hours. BNP (last 3 results) No results for input(s): PROBNP in the last 8760 hours. HbA1C: No results for input(s): HGBA1C in the last 72 hours. CBG: No results for input(s): GLUCAP in the last 168 hours. Lipid Profile: No results for input(s): CHOL, HDL, LDLCALC, TRIG, CHOLHDL, LDLDIRECT in the last 72 hours. Thyroid Function Tests: No results for input(s): TSH, T4TOTAL, FREET4, T3FREE, THYROIDAB in the last 72 hours. Anemia Panel: No results for input(s): VITAMINB12, FOLATE, FERRITIN, TIBC, IRON, RETICCTPCT in the last 72 hours. Urine analysis:    Component Value Date/Time   COLORURINE YELLOW 04/11/2019 1943   APPEARANCEUR CLEAR 04/11/2019 1943   LABSPEC 1.006 04/11/2019 1943   PHURINE 5.0 04/11/2019 1943   GLUCOSEU NEGATIVE 04/11/2019 1943   GLUCOSEU NEGATIVE 03/20/2017 1107   HGBUR NEGATIVE 04/11/2019 [place] 04/11/2019 1943   BILIRUBINUR negative 07/19/2018 1127   BILIRUBINUR Negative 02/15/2018 [place] 04/11/2019 1943   PROTEINUR NEGATIVE 04/11/2019 1943   UROBILINOGEN 0.2 07/19/2018 1127   UROBILINOGEN 0.2 03/20/2017 1107   NITRITE POSITIVE (A) 04/11/2019 1943   LEUKOCYTESUR TRACE (A) 04/11/2019 1943   Sepsis Labs: @LABRCNTIP (procalcitonin:4,lacticidven:4)  ) Recent Results (from the past 240  hour(s))  SARS CORONAVIRUS 2 (TAT 6-24 HRS) Nasopharyngeal Nasopharyngeal Swab     Status: None   Collection Time: 07/27/19 10:48 PM   Specimen: Nasopharyngeal Swab  Result Value Ref Range Status   SARS Coronavirus 2 NEGATIVE NEGATIVE Final    Comment: (NOTE) SARS-CoV-2 target nucleic acids are NOT DETECTED. The SARS-CoV-2 RNA is generally detectable in upper and lower respiratory specimens during the acute phase of infection. Negative results do not preclude SARS-CoV-2 infection, do not rule out co-infections with other pathogens, and should not be used as the sole basis for treatment or other patient management decisions. Negative results must be combined with clinical observations, patient history, and epidemiological information. The expected result is Negative. Fact Sheet for Patients: SugarRoll.be Fact Sheet for Healthcare Providers: https://www.woods-mathews.com/ This test is not yet approved or cleared by the [place] FDA and  has been authorized for detection  and/or diagnosis of SARS-CoV-2 by FDA under an Emergency Use Authorization (EUA). This EUA will remain  in effect (meaning this test can be used) for the duration of the COVID-19 declaration under Section 56 4(b)(1) of the Act, 21 U.S.C. section 360bbb-3(b)(1), unless the authorization is terminated or revoked sooner. Performed at [facility], [place] [address], [place], [place] [address]   Culture, body fluid-bottle     Status: None (Preliminary result)   Collection Time: 07/29/19 11:22 AM   Specimen: Peritoneal Washings  Result Value Ref Range Status   Specimen Description PERITONEAL CYTO  Final   Special Requests   Final    NONE Performed at [facility], [place] [address] [place], [place] [address]    Culture NO GROWTH 1 DAY  Final   Report Status PENDING  Incomplete  Gram stain     Status: None   Collection Time: 07/29/19 11:22 AM    Specimen: Peritoneal Washings  Result Value Ref Range Status   Specimen Description PERITONEAL  Final   Special Requests NONE  Final   Gram Stain   Final    FEW WBC PRESENT,BOTH PMN AND MONONUCLEAR NO ORGANISMS SEEN Performed at [facility], [address] [place], [place] [address]    Report Status 07/29/2019 FINAL  Final      Studies: No results found.  Scheduled Meds: . sodium chloride   Intravenous Once  . busPIRone  5 mg Oral BID  . DULoxetine  60 mg Oral Daily  . fesoterodine  4 mg Oral Daily  . fluticasone  1 spray Each Nare Daily  . hydrocortisone  25 mg Rectal BID  . latanoprost  1 drop Both Eyes QHS  . levETIRAcetam  250 mg Oral BID  . levothyroxine  175 mcg Oral QAC breakfast  . lipase/protease/amylase  24,000 Units Oral TID WC  . metoprolol tartrate  25 mg Oral BID  . pantoprazole  40 mg Oral Daily  . pregabalin  150 mg Oral BID  . rOPINIRole  0.5 mg Oral QHS    Continuous Infusions:   LOS: 3 days     Kayleen Memos, MD [facility] Pager [phone]  If 7PM-7AM, please contact night-coverage [email or web address] Password [facility] 07/31/2019, 10:36 AM

## 2019-07-31 NOTE — Progress Notes (Signed)
HEMATOLOGY-ONCOLOGY PROGRESS NOTE  SUBJECTIVE: She reports 2 episodes of diarrhea with blood yesterday.  The RN reports there was no blood in the stool last night.  She continues to have abdominal distention and pain.  PHYSICAL EXAMINATION:  Vitals:   07/30/19 2206 07/31/19 0231  BP: 132/65 135/64  Pulse: 90 90  Resp: 18 16  Temp: 98.3 F (36.8 C) 97.6 F (36.4 C)  SpO2: 97% 98%   Filed Weights   07/27/19 1854  Weight: 136 lb 14.5 oz (62.1 kg)    Intake/Output from previous day: 03/02 0701 - 03/03 0700 In: 750 [P.O.:750] Out: 1 [Urine:1]  ABDOMEN: Distended with ascites, mild tenderness diffusely, worse in the right lower abdomen Vascular: No leg edema  LABORATORY DATA:  I have reviewed the data as listed CMP Latest Ref Rng & Units 07/31/2019 07/30/2019 07/29/2019  Glucose 70 - 99 mg/dL 98 98 102(H)  BUN 8 - 23 mg/dL 16 16 13   Creatinine 0.44 - 1.00 mg/dL 0.83 0.87 0.89  Sodium 135 - 145 mmol/L 137 138 138  Potassium 3.5 - 5.1 mmol/L 4.3 4.4 4.5  Chloride 98 - 111 mmol/L 103 103 103  CO2 22 - 32 mmol/L 26 25 27   Calcium 8.9 - 10.3 mg/dL 8.1(L) 8.4(L) 8.4(L)  Total Protein 6.5 - 8.1 g/dL - - 5.4(L)  Total Bilirubin 0.3 - 1.2 mg/dL - - 0.8  Alkaline Phos 38 - 126 U/L - - 143(H)  AST 15 - 41 U/L - - 28  ALT 0 - 44 U/L - - 21    Lab Results  Component Value Date   WBC 5.0 07/31/2019   HGB 9.0 (L) 07/31/2019   HCT 28.9 (L) 07/31/2019   MCV 96.0 07/31/2019   PLT 116 (L) 07/31/2019   NEUTROABS 3.5 07/29/2019    CT ABDOMEN PELVIS W CONTRAST  Result Date: 07/27/2019 CLINICAL DATA:  diarrhea and rectal bleeding, question of diverticulitis EXAM: CT ABDOMEN AND PELVIS WITH CONTRAST TECHNIQUE: Multidetector CT imaging of the abdomen and pelvis was performed using the standard protocol following bolus administration of intravenous contrast. CONTRAST:  140mL OMNIPAQUE IOHEXOL 300 MG/ML  SOLN COMPARISON:  June 11, 2019 FINDINGS: Lower chest: The visualized heart size within  normal limits. No pericardial fluid/thickening. Again noted is calcifications of the mitral valve and coronary artery calcifications. There is streaky atelectasis seen at both lung bases. Hepatobiliary: There is diffuse low density seen throughout the liver parenchyma. Again noted is extensive pneumobilia. The portal vein appears to be patent the patient is status post cholecystectomy. Pancreas: The patient is status post Whipple with atrophy of the pancreatic body and surgical clips around the diminutive pancreatic head. Spleen: Normal in size without focal abnormality. Adrenals/Urinary Tract: Both adrenal glands appear normal. The kidneys and collecting system appear normal without evidence of urinary tract calculus or hydronephrosis. Bladder is unremarkable. Stomach/Bowel: Again noted is a prior gastrojejunostomy. The remainder of the small bowel is unremarkable. There is diffuse wall thickening seen surrounding the right colon especially at the hepatic flexure with surrounding mild fat stranding changes, best seen on series 2, image 43. There is extensive sigmoid colonic diverticulosis. No pericolonic free fluid or free air is seen. There appears to be mild wall thickening seen around the anorectal junction with mild perirectal edema. Vascular/Lymphatic: There is a small to moderate amount of abdominopelvic ascites. Scattered aortic atherosclerosis is noted. Again noted is a soft tissue density which surrounds the SMA and SMV at the level of the mid abdomen, series 2,  image 45 which is not significantly changed since the prior exam. The SMA, however does appear to be patent. The SMV however terminates within this soft tissue density and appears to be occluded. Reproductive: The patient is status post hysterectomy. No adnexal masses or collections seen. Other: A small anterior umbilical hernia seen which contains a small amount of ascitic fluid. Musculoskeletal: There is a healed right inferior pubic rami  fracture. Diffuse osteopenia. Posterior lumbar spine fixation is noted from L3 through S1. A chronic superior compression deformity of the L2 vertebral body seen with less than 25% loss in height. There is also chronic superior compression deformities of the T7 and T10 vertebral bodies with 30 to 40% loss in vertebral body height. IMPRESSION: 1. Again noted is findings suggestive of colitis/wall thickening involving the hepatic flexure and proximal right colon. 2. Diffuse wall thickening seen at the anorectal junction which could be due to proctocolitis. 3. Unchanged soft tissue density which encases the SMA and occludes the SMV at the level of the mid abdomen. 4. Hepatic steatosis and unchanged pneumobilia. 5. Small to moderate abdominopelvic ascites. 6. Extensive diverticulosis. 7.  Aortic Atherosclerosis (ICD10-I70.0). 8. Unchanged chronic compression deformities thoracic and lumbar spine and healed fracture deformity of the right inferior pubic rami. Electronically Signed   By: Prudencio Pair M.D.   On: 07/27/2019 21:58   US Paracentesis  Result Date: 07/29/2019 INDICATION: Patient with history of pancreatic cancer, recent GI bleed, colitis, recurrent ascites. Request made for diagnostic and therapeutic paracentesis. EXAM: ULTRASOUND GUIDED DIAGNOSTIC AND THERAPEUTIC PARACENTESIS MEDICATIONS: None COMPLICATIONS: None immediate. PROCEDURE: Informed written consent was obtained from the patient after a discussion of the risks, benefits and alternatives to treatment. A timeout was performed prior to the initiation of the procedure. Initial ultrasound scanning demonstrates a small amount of ascites within the right lower abdominal quadrant. The right lower abdomen was prepped and draped in the usual sterile fashion. 1% lidocaine was used for local anesthesia. Following this, a 19 gauge, 10-cm, Yueh catheter was introduced. An ultrasound image was saved for documentation purposes. The paracentesis was performed. The  catheter was removed and a dressing was applied. The patient tolerated the procedure well without immediate post procedural complication. FINDINGS: A total of approximately 820 cc of light yellow fluid was removed. Samples were sent to the laboratory as requested by the clinical team. IMPRESSION: Successful ultrasound-guided diagnostic and therapeutic paracentesis yielding 820 cc of peritoneal fluid. Read by: Rowe Robert, PA-C Electronically Signed   By: Sandi Mariscal M.D.   On: 07/29/2019 11:07   US Paracentesis  Result Date: 07/22/2019 INDICATION: Patient with history of pancreatic cancer presents for diagnostic and therapeutic paracentesis. EXAM: ULTRASOUND GUIDED DIAGNOSTIC AND THERAPEUTIC PARACENTESIS MEDICATIONS: 5 mL 1% lidocaine COMPLICATIONS: None immediate. PROCEDURE: Informed written consent was obtained from the patient after a discussion of the risks, benefits and alternatives to treatment. A timeout was performed prior to the initiation of the procedure. Initial ultrasound scanning demonstrates a small amount of ascites within the left lateral abdomen. The left lateral abdomen was prepped and draped in the usual sterile fashion. 1% lidocaine was used for local anesthesia. Following this, a 19 gauge, 7-cm, Yueh catheter was introduced. An ultrasound image was saved for documentation purposes. The paracentesis was performed. The catheter was removed and a dressing was applied. The patient tolerated the procedure well without immediate post procedural complication. FINDINGS: A total of approximately 300 mL of pale, yellow fluid was removed. Samples were sent to the laboratory as  requested by the clinical team. IMPRESSION: Successful ultrasound-guided diagnostic and therapeutic paracentesis yielding 300 mL liters of peritoneal fluid. Read by: Brynda Greathouse PA-C Electronically Signed   By: Jacqulynn Cadet M.D.   On: 07/22/2019 12:37    ASSESSMENT AND PLAN: 1. 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  CT abdomen/pelvis 01/19/2015 with no evidence of recurrent/metastatic disease.  CT abdomen/pelvis 07/20/2018-infiltrating soft tissue at the pancreas resection bed with involvement of the SMA, occlusion of the SMV with collaterals. New L2 compression fracture  SBRT beginning 08/21/2018, completed 08/31/2018, 5 fractions  CT 12/03/2018-no change in abnormal soft tissue encasing the SMA  CT 03/27/2019-new wall thickening and mucosal enhancement in the sigmoid and rectum, hepatic flexure wall thickening has resolved new or progressive ascending colonic wall thickening. Persistent soft tissue surrounding the SMA-stable, no ascites or evidence of peritoneal metastases, enlarging ventral hernias  CT abdomen/pelvis 06/11/2019-ascending colitis, enlarging soft tissue surrounding the SMV and SMA with occlusion of the SMV and ascites, diverticulosis  2. Nausea following the Whipple procedure-improved 3. Mild thrombocytopenia , Chronic  4.Pain secondary to local recurrence of pancreas cancer 5. Diarrhea-etiology unclear, Giardia antigen positive 03/21/2019  6.  GI bleeding 7.  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  Ms. Adams appears stable.  The hemoglobin is slightly lower today.  She does not appear to have a significant amount of diarrhea at present.  Cytology from the paracentesis on 07/29/2019 is pending.  The etiology of the bowel wall thickening, diarrhea, and GI bleeding is unclear, but I suspect this is related to the retroperitoneal tumor with occlusion of the SMV.  I will discuss the case with Dr. Watt Climes.  I do not feel chemotherapy will  help her symptoms.  She is not a candidate for anticoagulation therapy.  I recommend home hospice care.  I discussed this with Ms. Aye this morning.  I contacted her son by telephone and reviewed her current status and my recommendation for hospice.  We discussed the indication for continuing transfusion support.  He understands the poor prognosis.  He would like to discuss hospice care with Ms. Millender prior to a referral.  He will let us know if she agrees to hospice care.    LOS: 3 days   Betsy Coder, DNP, AGPCNP-BC, AOCNP 07/31/19

## 2019-07-31 NOTE — Progress Notes (Signed)
[name] 1:11 PM Subjective: Patient doing about the same without any new complaints and her case discussed with her son and oncology note appreciated  Objective: Vital signs stable afebrile no acute distress abdomen is soft mild lower tenderness hemoglobin slight drop other labs okay  Assessment: Multiple issues stemming from metastatic pancreatic cancer  Plan: The son is leaning towards hospice and palliative care and I will be on standby to help as needed  [facility]  office [phone] After 5PM or if no answer call [phone]

## 2019-07-31 NOTE — TOC Initial Note (Signed)
Transition of Care [facility]) - Initial/Assessment Note    Patient Details  Name: [name] MRN: [id number] Date of Birth: [birth year]  Transition of Care [facility]) CM/SW Contact:    [name] Ludwig, LCSW Phone Number: 07/31/2019, 4:31 PM  Clinical Narrative:                  CSW spoke to patient and her son [name] who was at bedside.  Patient and her son are trying to decided between going home with hospice or going home with home health and palliative to follow.  CSW discussed the differences between home health verse home hospice and also provided choice for hospice agencies.  CSW asked if they would like to talk with a hospice agency, and he said yes.  CSW provided referral to [facility] and he asked if someone can call him to discuss it in more details.  CSW contacted [name] from [facility] and gave her the referral.  She said she will call patient's son and discuss hospice services.  Patient's son stated he would like to talk with the physician again about hospice, CSW contacted physician and asked her to call patient's son.  4:30pm  CSW received a message from [facility], and she talked with patient's son.  Patient's son wants to discuss with the rest of the family and patient and [name] will follow up with patient tomorrow.  Expected Discharge Plan: Home w Hospice Care Barriers to Discharge: Continued Medical Work up   Patient Goals and CMS Choice Patient states their goals for this hospitalization and ongoing recovery are:: To go home with either hospice or home health with palliative. CMS Medicare.gov Compare Post Acute Care list provided to:: Patient Choice offered to / list presented to : Patient, Adult Children  Expected Discharge Plan and Services Expected Discharge Plan: Home w [place] Acute Care Choice: (To be determined)     Prior Living Arrangements/Services     Patient language and need for interpreter reviewed:: Yes Do  you feel safe going back to the place where you live?: Yes      Need for Family Participation in Patient Care: Yes (Comment) Care giver support system in place?: No (comment)   Criminal Activity/Legal Involvement Pertinent to Current Situation/Hospitalization: No - Comment as needed  Activities of Daily Living Home Assistive Devices/Equipment: Cane (specify quad or straight), Walker (specify type), Wheelchair, Dentures (specify type), Bedside commode/3-in-1, Other (Comment), Shower chair with back(upper/lower dentures, single point cane, front wheeled walker, ramp entrance to home, tub/shower unit) ADL Screening (condition at time of admission) Patient's cognitive ability adequate to safely complete daily activities?: Yes Is the patient deaf or have difficulty hearing?: No Does the patient have difficulty seeing, even when wearing glasses/contacts?: No Does the patient have difficulty concentrating, remembering, or making decisions?: Yes Patient able to express need for assistance with ADLs?: Yes Does the patient have difficulty dressing or bathing?: Yes Independently performs ADLs?: No Communication: Independent Dressing (OT): Needs assistance Is this a change from baseline?: Change from baseline, expected to last <3days Grooming: Independent Feeding: Needs assistance Is this a change from baseline?: Change from baseline, expected to last <3 days Bathing: Needs assistance Is this a change from baseline?: Change from baseline, expected to last <3 days Toileting: Needs assistance Is this a change from baseline?: Change from baseline, expected to last <3 days In/Out Bed: Needs assistance Is this a change from baseline?: Change from baseline, expected  to last <3 days Walks in Home: Needs assistance Is this a change from baseline?: Change from baseline, expected to last <3 days Does the patient have difficulty walking or climbing stairs?: Yes Weakness of Legs: Both Weakness of Arms/Hands:  None  Permission Sought/Granted Permission sought to share information with : Family Supports Permission granted to share information with : Yes, Verbal Permission Granted  Share Information with NAME: [name], [name] [phone]  [phone]  Permission granted to share info w AGENCY: Hospice and home health agencies        Emotional Assessment Appearance:: Appears stated age   Affect (typically observed): Accepting, Calm, Appropriate, Pleasant, Stable Orientation: : Oriented to Self, Oriented to Place, Oriented to  Time, Oriented to Situation Alcohol / Substance Use: Not Applicable Psych Involvement: No (comment)  Admission diagnosis:  Rectal bleeding [K62.5] Generalized abdominal pain [R10.84] Acute GI bleeding [K92.2] Colitis [K52.9] Patient Active Problem List   Diagnosis Date Noted  . Acute GI bleeding 07/28/2019  . Fatigue associated with anemia 07/28/2019  . Colitis 07/28/2019  . Blood in stool 06/25/2019  . Rectal bleeding 06/25/2019  . Diarrhea 03/20/2019  . Acute colitis 03/20/2019  . Acute renal failure (ARF) ([place]) 03/20/2019  . Aortic atherosclerosis ([place]) 01/05/2019  . 3-vessel CAD, on CT chest 01/05/2019  . Primary open angle glaucoma (POAG) of both eyes, moderate stage 04/18/2018  . Pseudophakia, right eye 04/18/2018  . History of lumbar fusion 11/01/2017  . Tardive dyskinesia, vitamin E   . History of stroke   . Mitral valve prolapse   . Chronic maxillary sinusitis, using NetiPot, flonase   . 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 Chronic left sacral fracture with mild edema, MRI 07/2017 01/07/2017  . Thrombocytopenia ([address]), mild, chronic 01/07/2017  . HLD (hyperlipidemia), on Lovastatin 07/28/2015  . Fatty liver, on CT 09/09/14 09/09/2014  . Exocrine pancreatic insufficiency, Rx Creon 12/30/2013  . Carcinoma of head of pancreas [facility]), followed by Dr [name], Oncology 07/01/2013  . Peripheral neuropathy, Lyrica 400 mg BID  08/30/2012  . Nuclear sclerosis, left 04/13/2011  . 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 Colon polyps 06/05/2008  . Osteoarthritis, mulitple sites 06/04/2008  . Chronic diarrhea, intermittent 04/22/2008  . Hypothyroidism, on Levothryoxine 02/26/2007  . Adjustment disorder with mixed anxiety and depressed mood, on Cymbalta and Buspar 02/26/2007  . Essential hypertension 02/26/2007  . GERD, on Prilosec 02/26/2007  . Osteoporosis, on Calcium and Vitamin D 02/26/2007   PCP:  Leamon Arnt, MD Pharmacy:   [id number] - [place], [address] [place] [address] Phone: [phone] Fax: [phone]     Social Determinants of Health (SDOH) Interventions    Readmission Risk Interventions Readmission Risk Prevention Plan 07/31/2019  Transportation Screening Complete  PCP or Specialist Appt within 3-5 Days Complete  HRI or [place] Complete  Social Work Consult for [place] Planning/Counseling Complete  Palliative Care Screening Complete  Medication Review Press photographer) Referral to Pharmacy  Some recent data might be hidden

## 2019-07-31 NOTE — Progress Notes (Signed)
   07/31/19 1300  Clinical Encounter Type  Visited With Patient;Family;Other (Comment) (Son)  Visit Type Initial;Psychological support;Spiritual support  Referral From Social work  Consult/Referral To Chaplain  Spiritual Encounters  Spiritual Needs Emotional;Literature;Other (Comment) (Advance Directive )  Stress Factors  Patient Stress Factors Health changes;Major life changes  Family Stress Factors Health changes;Major life changes  Advance Directives (For Healthcare)  Does Patient Have a Medical Advance Directive? No  Would patient like information on creating a medical advance directive? No - Patient declined   I spoke with [name] and her son per referral from social work to discuss an Forensic scientist. Both [name] and her son declined creating an Forensic scientist at this time. [name] and her son told me that they know what her healthcare wishes are and are comfortable with making decisions if needed. [name] stated that her sons are her next-of-kin and she is comfortable with them making her decisions for her if she is unable.    Please, contact Spiritual Care for further assistance.   Chaplain Shanon Ace M.Div., [facility]

## 2019-07-31 NOTE — Consult Note (Signed)
Consultation Note Date: 07/31/2019   Patient Name: [name]  DOB: December 19, 1940  MRN: [id number]  Age / Sex: 79 y.o., female  PCP: Leamon Arnt, MD Referring Physician: Kayleen Memos, DO  Reason for Consultation: Establishing goals of care  HPI/Patient Profile: 79 y.o. 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 Presents with symptomatic anemia secondary to GI bleed admitted on 07/27/2019  Clinical Assessment and Goals of Care:  Medical Oncology has been following. Patient remains admitted to hospital medicine. GI specialists have also been following. Palliative consult for ongoing goals of care discussions.  [name] is resting in bed, she appears in no distress, how ever is resting in bed with her eyes closed. Her son [name] was at the bedside. It appeared prudent to let [name] rest for this afternoon, hence a discussion was held with son [name] outside the patient's room.   I introduced myself and palliative care as follows: Palliative medicine is specialized medical care for people living with serious illness. It focuses on providing relief from the symptoms and stress of a serious illness. The goal is to improve quality of life for both the patient and the family.  Goals of care: Broad aims of medical therapy in relation to the patient's values and preferences. Our aim is to provide medical care aimed at enabling patients to achieve the goals that matter most to them, given the circumstances of their particular medical situation and their constraints.   We reviewed the patient's current condition, her serious incurable illness and the context of this hospitalization. She continues to have abdominal pain, diarrhea with blood  yesterday, has required PRBC.   A brief life review was performed. [name] lives in [place], [place] by herself, she has 2 sons who live locally.   We discussed about next steps, broad goals of care, code status, scope of hospice. Different levels of hospice support: home with hospice, residential hospice versus hospice at a facility were all discussed to some length.   We talked about the differences between full code versus DNR.   Patient's son states that the patient is leaning towards hospice support, he understands that her prognosis could be limited to less than 6 months, possibly shorter at this stage.   Offered active listening and supportive presence. Acknowledged the current situation. Offered space for the patient to be able to continue these discussions between herself and her 2 sons. Recommended for consideration for DNR DNI and home with hospice initially and then residential hospice afterwards if appropriate.   See below.   NEXT OF KIN  2 sons. Son [name] is currently at the bedside.   SUMMARY OF RECOMMENDATIONS    full code for now Continue goals of care discussions and hospice discussions.  Thank you for the consult   Code Status/Advance Care Planning:  Full code    Symptom Management:  continue current mode of care.   Palliative Prophylaxis:   Delirium Protocol  Additional Recommendations (Limitations, Scope, Preferences):  Avoid Hospitalization  Psycho-social/Spiritual:   Desire for further Chaplaincy support:yes  Additional Recommendations: Education on Hospice  Prognosis:   < 6 months  Discharge Planning: To Be Determined      Primary Diagnoses: Present on Admission: . Acute GI bleeding . Fatigue associated with anemia . Colitis   I have reviewed the medical record, interviewed the patient and family, and examined the patient. The following aspects are pertinent.  Past Medical History:  Diagnosis Date  . Abnormality of gait  03/27/2014  . Allergic rhinitis   . Ankle fracture, bimalleolar, closed 08/16/2015  . B12 deficiency 08/08/2017  . Cancer ([facility]) 07/10/13   Pancreatic cancer with MRI scan 06-19-13  . Chronic maxillary sinusitis    neti pot  . Closed fracture of ramus of right pubis ([facility]) 11/22/2016  . Depression    alone a lot  . Eustachian tube dysfunction   . GERD (gastroesophageal reflux disease)   . Glaucoma   . Heart murmur    hx. "MVP" -predental antibiotics  . Hiatal hernia   . Hypertension   . Hypothyroid   . Memory loss    short term memory loss  . Mitral valve prolapse    antibiotics before dental procedures  . Neuropathy   . Open Colles' fracture of right radius 11/02/2016  . Osteoarthritis   . Sacral fracture, closed ([facility]) 01/07/2017  . Stroke [facility])    mini storkes left leg paraylsis. patient denies weakness 01/08/14.   . 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 Tardive dyskinesia    possibly reglan, vitamin E helps  . Urgency of urination    some UTI in past   Social History   Socioeconomic History  . Marital status: Widowed    Spouse name: Not on file  . Number of children: 2  . Years of education: 8  . Highest education level: Not on file  Occupational History    Employer: RETIRED  . Occupation: Retired  Tobacco Use  . Smoking status: Never Smoker  . Smokeless tobacco: Never Used  Substance and Sexual Activity  . Alcohol use: No  . Drug use: No  . Sexual activity: Not Currently  Other Topics Concern  . 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 Difficulty of Paying Living Expenses: Not on file  Food Insecurity:   . Worried About Charity fundraiser in the Last Year: Not on file  . Ran Out of Food in the Last Year: Not on file  Transportation Needs: No Transportation Needs  . Lack of Transportation (Medical): No  . Lack of Transportation (Non-Medical): No  Physical Activity:   . Days of Exercise per  Week: Not on file  . Minutes of Exercise per Session: Not on file  Stress:   . Feeling of Stress : Not on file  Social Connections:   . Frequency of Communication with Friends and Family: Not on file  . Frequency of Social Gatherings with Friends and Family: Not on file  . Attends Religious Services: Not on file  . Active Member of Clubs or Organizations: Not on file  . Attends Archivist Meetings: Not on file  . Marital Status: Not on file   Family History  Problem Relation Age of Onset  . Cancer Mother        Breast Cancer with Metastatic disease  . Alzheimer's disease Father   . Other Brother 48       GSW  . Hypertension Neg Hx    Scheduled Meds: . sodium chloride   Intravenous Once  . busPIRone  5 mg Oral BID  . DULoxetine  60 mg Oral Daily  . fesoterodine  4 mg Oral Daily  . fluticasone  1 spray Each Nare Daily  . hydrocortisone  25 mg Rectal BID  . latanoprost  1 drop Both Eyes QHS  . levETIRAcetam  250 mg Oral BID  . levothyroxine  175 mcg Oral QAC breakfast  . lipase/protease/amylase  24,000 Units Oral TID WC  . metoprolol tartrate  25 mg Oral BID  . pantoprazole  40 mg Oral Daily  . pregabalin  150 mg Oral BID  . rOPINIRole  0.5 mg Oral QHS   Continuous Infusions: PRN Meds:.ALPRAZolam, HYDROcodone-acetaminophen, lip balm, morphine injection, ondansetron **OR** ondansetron (ZOFRAN) IV, polyvinyl alcohol Medications Prior to Admission:  Prior to Admission  medications   Medication Sig Start Date End Date Taking? Authorizing Provider  acetaminophen (TYLENOL) 500 MG tablet Take 1,000 mg by mouth every 6 (six) hours as needed for moderate pain.   Yes [provider]  Ascorbic Acid (VITAMIN C) 1000 MG tablet Take 1,000 mg by mouth daily.   Yes [provider]  aspirin EC 81 MG tablet Take 81 mg by mouth daily.   Yes [provider]  atenolol (TENORMIN) 50 MG tablet Take 0.5 tablets (25 mg total) by mouth 2 (two) times daily. 06/14/19  Yes Leamon Arnt, MD  betamethasone dipropionate 0.05 % lotion Apply 1 application topically daily as needed (itching).  06/28/18  Yes [provider]  busPIRone (BUSPAR) 5 MG tablet Take 1 tablet by mouth twice daily Patient taking differently: Take 5 mg by mouth 2 (two) times daily.  05/27/19  Yes Leamon Arnt, MD  Calcium Carb-Cholecalciferol (CALCIUM 600+D) 600-800 MG-UNIT TABS Take 1 tablet by mouth daily.   Yes [provider]  carboxymethylcellulose (REFRESH TEARS) 0.5 % SOLN Place 1 drop into both eyes 2 (two) times daily.    Yes [provider]  cholecalciferol (VITAMIN D) 1000 units tablet Take 1,000 Units by mouth 2 (two) times daily.   Yes [provider]  Coenzyme Q10 300 MG CAPS Take 300 mg by mouth daily.   Yes [provider]  diphenoxylate-atropine (LOMOTIL) 2.5-0.025 MG tablet Take 1-2 tablets by mouth 4 (four) times daily as needed for diarrhea or loose stools. 04/05/19  Yes Ladell Pier, MD  DULoxetine (CYMBALTA) 60 MG capsule Take 1 capsule (60 mg total) by mouth daily. 06/14/19  Yes Leamon Arnt, MD  fluticasone (FLONASE) 50 MCG/ACT nasal spray Place 1 spray into both nostrils daily.   Yes [provider]  HYDROcodone-acetaminophen (  NORCO/VICODIN) 5-325 MG tablet Take 1 tablet by mouth every 6 (six) hours as needed for moderate pain. 05/22/19  Yes Owens Shark, NP  levETIRAcetam (KEPPRA) 250 MG tablet Take 1  tablet (250 mg total) by mouth 2 (two) times daily. 05/06/19  Yes Kathrynn Ducking, MD  levothyroxine (SYNTHROID) 175 MCG tablet TAKE 1 TABLET BY MOUTH  DAILY BEFORE BREAKFAST Patient taking differently: Take 175 mcg by mouth daily before breakfast.  05/27/19  Yes Leamon Arnt, MD  loperamide (IMODIUM A-D) 2 MG tablet take 1 tablet by mouth as needed for diarrhea or loose stools *maximum of 8 tablets per day* Patient taking differently: Take 2 mg by mouth daily as needed for diarrhea or loose stools.  06/04/19  Yes Ladell Pier, MD  loratadine (CLARITIN) 10 MG tablet Take 10 mg by mouth daily.   Yes [provider]  ondansetron (ZOFRAN) 8 MG tablet Take 1 tablet (8 mg total) by mouth every 8 (eight) hours. Patient taking differently: Take 8 mg by mouth every 8 (eight) hours as needed for nausea or vomiting.  09/05/18  Yes Hayden Pedro, PA-C  Pancrelipase, Lip-Prot-Amyl, (CREON) 24000-76000 units CPEP Take 2 capsules by mouth 3 (three) times daily with meals.    Yes [provider]  pantoprazole (PROTONIX) 40 MG tablet Take 40 mg by mouth daily.   Yes [provider]  pregabalin (LYRICA) 150 MG capsule Take 1 capsule (150 mg total) by mouth 2 (two) times daily. 05/06/19  Yes Kathrynn Ducking, MD  simethicone (MYLICON) 0000000 MG chewable tablet Chew 125 mg by mouth daily as needed for flatulence.    Yes [provider]  tolterodine (DETROL LA) 4 MG 24 hr capsule Take 4 mg by mouth daily.  08/24/17  Yes [provider]  triamcinolone ointment (KENALOG) 0.5 % Apply 1 application topically 2 (two) times daily. Patient taking differently: Apply 1 application topically 2 (two) times daily as needed (irritation bottom of feet).  04/23/18  Yes Worley, Aldona Bar, PA  latanoprost (XALATAN) 0.005 % ophthalmic solution Place 1 drop into both eyes at bedtime.  08/07/15   [provider]  lovastatin (MEVACOR) 40 MG tablet TAKE 1 TABLET BY MOUTH EVERYDAY  AT BEDTIME Patient taking differently: Take 40 mg by mouth at bedtime.  05/22/19   Leamon Arnt, MD  rOPINIRole (REQUIP) 0.5 MG tablet TAKE 1 TABLET BY MOUTH AT BEDTIME Patient taking differently: Take 0.5 mg by mouth at bedtime.  02/13/19   Briscoe Deutscher, DO  terbinafine (LAMISIL) 250 MG tablet Take 250 mg by mouth daily. 06/19/19   [provider]  VITAMIN E PO Take 1 tablet by mouth daily.    [provider]   Allergies  Allergen Reactions  . Carafate [Sucralfate] Rash  . Metoclopramide Hcl Other (See Comments)    Shaking; caused tardive dyskinesia  . Opium Rash    Red rash around both eyes Opium 10 mg/mg 1% /Tinc  . Other Other (See Comments)    According to patient Trilafor and Reglan cause Tardive Dyskinesia  . Niacin Other (See Comments)    shaking  . Trovan [Alatrofloxacin Mesylate] Other (See Comments)    Caused shaking and nervousness  . Benzocaine-Resorcinol Rash  . Celecoxib Other (See Comments)    unknown  . Erythromycin Base Rash  . Glucosamine Other (See Comments)    unknown  . Nortriptyline Other (See Comments)    Dizziness   . Phenazopyridine Hcl Other (See Comments)  unknown  . Sulfa Antibiotics Nausea And Vomiting and Rash  . Sulfonamide Derivatives Nausea And Vomiting and Rash   Review of Systems +weakness  Physical Exam Appears chronically ill Has distended abdomen No edema Awake alert No distress  Vital Signs: BP 108/68 (BP Location: Left Arm)   Pulse 84   Temp 97.7 F (36.5 C) (Oral)   Resp 16   Ht 5\' 1"  (1.549 m)   Wt 62.1 kg   SpO2 98%   BMI 25.87 kg/m  Pain Scale: 0-10   Pain Score: 1    SpO2: SpO2: 98 % O2 Device:SpO2: 98 % O2 Flow Rate: .   IO: Intake/output summary:   Intake/Output Summary (Last 24 hours) at 07/31/2019 1547 Last data filed at 07/31/2019 1240 Gross per 24 hour  Intake 530 ml  Output 1 ml  Net 529 ml    LBM: Last BM Date: 07/30/19 Baseline Weight: Weight: 62.1 kg Most recent  weight: Weight: 62.1 kg     Palliative Assessment/Data:   PPS 40%  Time In:  1430 Time Out:  1530 Time Total:  60 Greater than 50%  of this time was spent counseling and coordinating care related to the above assessment and plan.  Signed by: Loistine Chance, MD   Please contact Palliative Medicine Team phone at [phone] for questions and concerns.  For individual provider: See Shea Evans

## 2019-07-31 NOTE — Progress Notes (Signed)
Pts son present at pt's bedside. Stated he spoke with MD this morning about plan for hospice. Son is requesting to have MD, SW and Chaplain present at bedside to discuss hospice, answer questions and for spiritual guidance. MD notified.

## 2019-07-31 NOTE — Care Management Important Message (Signed)
Important Message  Patient Details IM Letter given to Evette Cristal SW Case Manager to present to the Patient Name: [name] MRN: [id number] Date of Birth: 09-Jun-1940   Medicare Important Message Given:  Yes     Kerin Salen 07/31/2019, 11:02 AM

## 2019-07-31 NOTE — Telephone Encounter (Signed)
Cancelled appts per 3/3 sch msg, pt req. Called an spoke with [name] (husband), confirmed cancelled appt. Sent msg to Dr. [name] nurse

## 2019-08-01 ENCOUNTER — Ambulatory Visit: Payer: Medicare Other | Admitting: Nurse Practitioner

## 2019-08-01 ENCOUNTER — Telehealth: Payer: Self-pay | Admitting: Family Medicine

## 2019-08-01 ENCOUNTER — Inpatient Hospital Stay (HOSPITAL_COMMUNITY): Payer: Medicare Other

## 2019-08-01 ENCOUNTER — Other Ambulatory Visit: Payer: Self-pay | Admitting: Oncology

## 2019-08-01 ENCOUNTER — Telehealth: Payer: Self-pay | Admitting: Oncology

## 2019-08-01 DIAGNOSIS — C250 Malignant neoplasm of head of pancreas: Secondary | ICD-10-CM

## 2019-08-01 DIAGNOSIS — R531 Weakness: Secondary | ICD-10-CM

## 2019-08-01 LAB — BASIC METABOLIC PANEL
Anion gap: 9 (ref 5–15)
BUN: 17 mg/dL (ref 8–23)
CO2: 24 mmol/L (ref 22–32)
Calcium: 8.1 mg/dL — ABNORMAL LOW (ref 8.9–10.3)
Chloride: 101 mmol/L (ref 98–111)
Creatinine, Ser: 0.83 mg/dL (ref 0.44–1.00)
GFR calc Af Amer: >60 mL/min (ref >60)
GFR calc non Af Amer: >60 mL/min (ref >60)
Glucose, Bld: 93 mg/dL (ref 70–99)
Potassium: 4.2 mmol/L (ref 3.5–5.1)
Sodium: 134 mmol/L — ABNORMAL LOW (ref 135–145)

## 2019-08-01 LAB — CBC
HCT: 29.6 % — ABNORMAL LOW (ref 36.0–46.0)
Hemoglobin: 9.3 g/dL — ABNORMAL LOW (ref 12.0–15.0)
MCH: 29.8 pg (ref 26.0–34.0)
MCHC: 31.4 g/dL (ref 30.0–36.0)
MCV: 94.9 fL (ref 80.0–100.0)
Platelets: 130 10*3/uL — ABNORMAL LOW (ref 150–400)
RBC: 3.12 MIL/uL — ABNORMAL LOW (ref 3.87–5.11)
RDW: 15 % (ref 11.5–15.5)
WBC: 6.8 10*3/uL (ref 4.0–10.5)
nRBC: 0 % (ref 0.0–0.2)

## 2019-08-01 MED ORDER — NALOXONE HCL 0.4 MG/ML IJ SOLN: 0.4000 mg | Freq: Once | INTRAMUSCULAR | Status: DC

## 2019-08-01 MED ORDER — NALOXONE HCL 0.4 MG/ML IJ SOLN
0.4000 mg | Freq: Once | INTRAMUSCULAR | Status: AC
  Administered 2019-08-01: 15:00:00 0.4 mg via INTRAMUSCULAR
  Filled 2019-08-01: qty 1

## 2019-08-01 MED ORDER — ALPRAZOLAM 0.25 MG PO TABS: 0.2500 mg | ORAL_TABLET | Freq: Two times a day (BID) | ORAL | 0 refills | Status: DC | PRN

## 2019-08-01 MED ORDER — HYDROCODONE-ACETAMINOPHEN 5-325 MG PO TABS
1.0000 | ORAL_TABLET | Freq: Three times a day (TID) | ORAL | Status: DC | PRN
  Administered 2019-08-02: 01:00:00 1 via ORAL
  Filled 2019-08-01: qty 1

## 2019-08-01 MED ORDER — FLUMAZENIL 0.5 MG/5ML IV SOLN: 0.3000 mg | Freq: Once | INTRAVENOUS | Status: DC

## 2019-08-01 MED ORDER — METOPROLOL TARTRATE 25 MG PO TABS: 25.0000 mg | ORAL_TABLET | Freq: Two times a day (BID) | ORAL | 0 refills | Status: AC

## 2019-08-01 MED ORDER — HYDROCORTISONE ACETATE 25 MG RE SUPP: 25.0000 mg | Freq: Two times a day (BID) | RECTAL | 0 refills | Status: DC

## 2019-08-01 MED ORDER — HYDROCODONE-ACETAMINOPHEN 5-325 MG PO TABS: 1.0000 | ORAL_TABLET | Freq: Two times a day (BID) | ORAL | 0 refills | Status: AC | PRN

## 2019-08-01 NOTE — Discharge Summary (Addendum)
Discharge Summary  [name] FGH:[id number] DOB: [birth year]  PCP: Leamon Arnt, MD  Admit date: 07/27/2019 Discharge date: 08/01/2019  Time spent: 35 minutes  Recommendations for Outpatient Follow-up:  1. Continue hospice care.  Discharge Diagnoses:  Active Hospital Problems   Diagnosis Date Noted  . Acute GI bleeding 07/28/2019  . Fatigue associated with anemia 07/28/2019  . Colitis 07/28/2019    Resolved Hospital Problems  No resolved problems to display.    Discharge Condition: Stable.  Diet recommendation: Pleasure feedings.  Vitals:   07/31/19 2104 08/01/19 0411  BP: 128/85 124/70  Pulse: 95 84  Resp: 16 16  Temp: (!) 97.5 F (36.4 C) 97.6 F (36.4 C)  SpO2: 97% 95%    History of present illness:  [name] is a 79 y.o. female with medical history significant of GERD, essential hypertension, peripheral neuropathy, chronic anxiety/depression, hypothyroidism, chronic diarrhea, pancreatic cancer and history of GI bleed who presented to [facility] ED for evaluation of acute lower GI bleed.  Associated with abdominal pain and generalized weakness.  TRH asked to admit.    Seen by GI and oncology.  Palliative care team consulted due to poor prognosis.  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  Not a candidate for anticoagulation due to GI bleed.  CT AP with contrast done on 07/27/2019 showed unchanged soft tissue density which encases the SMA and occludes the SMV at the level of the mid abdomen.  Findings suggestive of colitis/wall thickening involving the hepatic flexure and proximal right colon.  Diffuse wall thickening seen at the anorectal junction which could be due to proctolitis.  Hepatic steatosis and unchanged pneumobilia.  Small to moderate abdominal pelvic ascites.  Extensive diverticulosis.  Aortic atherosclerosis.  Unchanged chronic compression deformity thoracic and lumbar spine and healed  fracture deformity of the right inferior pubic rami.  S/p post paracentesis 07/29/19 by IR: cytology showed reactive mesothelial cells.  08/01/19: Seen and examined at her bedside.  She reports mild diffuse abdominal pain which she rates at 3 out of 10.  Denies nausea or vomiting.  Had 2 loose stools overnight with a small amount of blood.  Hemoglobin stable and mildly trending up 9.3 from 9.0.  Palliative care team met with patient and family.  Decision was made to discharge to home with hospice.   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 Moderate protein calorie malnutrition  Plan:  Continue hospice care.  Defer medication management to Hospice care.  All focus on comfort care.   Procedures:  Paracentesis by IR 07/29/19  Consultations:  Medical oncology  GI  Palliative medicine team  IR  Discharge Exam: BP 124/70 (BP Location: Left Arm)   Pulse 84   Temp 97.6 F (36.4 C) (Oral)   Resp 16   Ht 5' 1" (1.549 m)   Wt 62.1 kg   SpO2 95%   BMI 25.87 kg/m  . General: 79 y.o. year-old female well developed well nourished in no acute distress.  Alert and oriented x3. . Cardiovascular: Regular rate and rhythm with no  rubs or gallops.  No thyromegaly or JVD noted.   Marland Kitchen Respiratory: Clear to auscultation with no wheezes or rales. Good inspiratory effort. . Abdomen: Soft diffused abdominal pain with bowel sounds present. . Musculoskeletal: No lower extremity edema. 2/4 pulses in all 4 extremities. Marland Kitchen Psychiatry: Mood is  appropriate for condition and setting  Discharge Instructions You were cared for by a hospitalist during your hospital stay. If you have any questions about your discharge medications or the care you received while you were in the hospital after you are discharged, you can call the unit and asked to speak with the hospitalist on call if the hospitalist that took care of you is not available. Once you are discharged, your primary care physician will handle any further medical issues. Please note that NO REFILLS for any discharge medications will be authorized once you are discharged, as it is imperative that you return to your primary care physician (or establish a relationship with a primary care physician if you do not have one) for your aftercare needs so that they can reassess your need for medications and monitor your lab values.   Allergies as of 08/01/2019      Reactions   Carafate [sucralfate] Rash   Metoclopramide Hcl Other (See Comments)   Shaking; caused tardive dyskinesia   Opium Rash   Red rash around both eyes Opium 10 mg/mg 1% /Tinc   Other Other (See Comments)   According to patient Trilafor and Reglan cause Tardive Dyskinesia   Niacin Other (See Comments)   shaking   Trovan [alatrofloxacin Mesylate] Other (See Comments)   Caused shaking and nervousness   Benzocaine-resorcinol Rash   Celecoxib Other (See Comments)   unknown   Erythromycin Base Rash   Glucosamine Other (See Comments)   unknown   Nortriptyline Other (See Comments)   Dizziness    Phenazopyridine Hcl Other (See Comments)   unknown   Sulfa Antibiotics Nausea And Vomiting, Rash   Sulfonamide Derivatives Nausea And Vomiting, Rash      Medication List    STOP taking these medications   acetaminophen 500 MG tablet Commonly known as: TYLENOL   aspirin EC 81 MG tablet   atenolol 50 MG tablet Commonly known as: TENORMIN   lovastatin 40 MG tablet Commonly known as: MEVACOR   terbinafine 250 MG  tablet Commonly known as: LAMISIL     TAKE these medications   ALPRAZolam 0.25 MG tablet Commonly known as: XANAX Take 1 tablet (0.25 mg total) by mouth 2 (two) times daily as needed for up to 5 days for anxiety.   betamethasone dipropionate 0.05 % lotion Apply 1 application topically daily as needed (itching).   busPIRone 5 MG tablet Commonly known as: BUSPAR Take 1 tablet by mouth twice daily   Calcium 600+D 600-800 MG-UNIT Tabs Generic drug: Calcium Carb-Cholecalciferol Take 1 tablet by mouth daily.   cholecalciferol 1000 units tablet Commonly known as: VITAMIN D Take 1,000 Units by mouth 2 (two) times daily.   Coenzyme Q10 300 MG Caps Take 300 mg by mouth daily.   Creon 24000-76000 units Cpep Generic drug: Pancrelipase (Lip-Prot-Amyl) Take 2 capsules by mouth 3 (three) times daily with meals.   diphenoxylate-atropine 2.5-0.025 MG tablet Commonly known as: LOMOTIL Take 1-2 tablets by mouth 4 (four) times daily as needed for diarrhea or loose stools.   DULoxetine 60 MG capsule Commonly known as: CYMBALTA Take 1 capsule (60 mg total) by mouth daily.   fluticasone 50 MCG/ACT nasal spray Commonly known  as: FLONASE Place 1 spray into both nostrils daily.   HYDROcodone-acetaminophen 5-325 MG tablet Commonly known as: NORCO/VICODIN Take 1 tablet by mouth 2 (two) times daily as needed for up to 5 days for moderate pain or severe pain. What changed:   when to take this  reasons to take this   hydrocortisone 25 MG suppository Commonly known as: ANUSOL-HC Place 1 suppository (25 mg total) rectally 2 (two) times daily.   latanoprost 0.005 % ophthalmic solution Commonly known as: XALATAN Place 1 drop into both eyes at bedtime.   levETIRAcetam 250 MG tablet Commonly known as: Keppra Take 1 tablet (250 mg total) by mouth 2 (two) times daily.   levothyroxine 175 MCG tablet Commonly known as: SYNTHROID TAKE 1 TABLET BY MOUTH  DAILY BEFORE BREAKFAST What changed:    how much to take  how to take this  when to take this  additional instructions   loperamide 2 MG tablet Commonly known as: IMODIUM A-D take 1 tablet by mouth as needed for diarrhea or loose stools *maximum of 8 tablets per day* What changed: See the new instructions.   loratadine 10 MG tablet Commonly known as: CLARITIN Take 10 mg by mouth daily.   metoprolol tartrate 25 MG tablet Commonly known as: LOPRESSOR Take 1 tablet (25 mg total) by mouth 2 (two) times daily.   ondansetron 8 MG tablet Commonly known as: ZOFRAN Take 1 tablet (8 mg total) by mouth every 8 (eight) hours. What changed:   when to take this  reasons to take this   pantoprazole 40 MG tablet Commonly known as: PROTONIX Take 40 mg by mouth daily.   pregabalin 150 MG capsule Commonly known as: LYRICA Take 1 capsule (150 mg total) by mouth 2 (two) times daily.   Refresh Tears 0.5 % Soln Generic drug: carboxymethylcellulose Place 1 drop into both eyes 2 (two) times daily.   rOPINIRole 0.5 MG tablet Commonly known as: REQUIP TAKE 1 TABLET BY MOUTH AT BEDTIME   simethicone 125 MG chewable tablet Commonly known as: MYLICON Chew 287 mg by mouth daily as needed for flatulence.   tolterodine 4 MG 24 hr capsule Commonly known as: DETROL LA Take 4 mg by mouth daily.   triamcinolone ointment 0.5 % Commonly known as: KENALOG Apply 1 application topically 2 (two) times daily. What changed:   when to take this  reasons to take this   vitamin C 1000 MG tablet Take 1,000 mg by mouth daily.   VITAMIN E PO Take 1 tablet by mouth daily.      Allergies  Allergen Reactions  . Carafate [Sucralfate] Rash  . Metoclopramide Hcl Other (See Comments)    Shaking; caused tardive dyskinesia  . Opium Rash    Red rash around both eyes Opium 10 mg/mg 1% /Tinc  . Other Other (See Comments)    According to patient Trilafor and Reglan cause Tardive Dyskinesia  . Niacin Other (See Comments)    shaking   . Trovan [Alatrofloxacin Mesylate] Other (See Comments)    Caused shaking and nervousness  . Benzocaine-Resorcinol Rash  . Celecoxib Other (See Comments)    unknown  . Erythromycin Base Rash  . Glucosamine Other (See Comments)    unknown  . Nortriptyline Other (See Comments)    Dizziness   . Phenazopyridine Hcl Other (See Comments)    unknown  . Sulfa Antibiotics Nausea And Vomiting and Rash  . Sulfonamide Derivatives Nausea And Vomiting and Rash      The  results of significant diagnostics from this hospitalization (including imaging, microbiology, ancillary and laboratory) are listed below for reference.    Significant Diagnostic Studies: CT ABDOMEN PELVIS W CONTRAST  Result Date: 07/27/2019 CLINICAL DATA:  diarrhea and rectal bleeding, question of diverticulitis EXAM: CT ABDOMEN AND PELVIS WITH CONTRAST TECHNIQUE: Multidetector CT imaging of the abdomen and pelvis was performed using the standard protocol following bolus administration of intravenous contrast. CONTRAST:  182m OMNIPAQUE IOHEXOL 300 MG/ML  SOLN COMPARISON:  June 11, 2019 FINDINGS: Lower chest: The visualized heart size within normal limits. No pericardial fluid/thickening. Again noted is calcifications of the mitral valve and coronary artery calcifications. There is streaky atelectasis seen at both lung bases. Hepatobiliary: There is diffuse low density seen throughout the liver parenchyma. Again noted is extensive pneumobilia. The portal vein appears to be patent the patient is status post cholecystectomy. Pancreas: The patient is status post Whipple with atrophy of the pancreatic body and surgical clips around the diminutive pancreatic head. Spleen: Normal in size without focal abnormality. Adrenals/Urinary Tract: Both adrenal glands appear normal. The kidneys and collecting system appear normal without evidence of urinary tract calculus or hydronephrosis. Bladder is unremarkable. Stomach/Bowel: Again noted is a prior  gastrojejunostomy. The remainder of the small bowel is unremarkable. There is diffuse wall thickening seen surrounding the right colon especially at the hepatic flexure with surrounding mild fat stranding changes, best seen on series 2, image 43. There is extensive sigmoid colonic diverticulosis. No pericolonic free fluid or free air is seen. There appears to be mild wall thickening seen around the anorectal junction with mild perirectal edema. Vascular/Lymphatic: There is a small to moderate amount of abdominopelvic ascites. Scattered aortic atherosclerosis is noted. Again noted is a soft tissue density which surrounds the SMA and SMV at the level of the mid abdomen, series 2, image 45 which is not significantly changed since the prior exam. The SMA, however does appear to be patent. The SMV however terminates within this soft tissue density and appears to be occluded. Reproductive: The patient is status post hysterectomy. No adnexal masses or collections seen. Other: A small anterior umbilical hernia seen which contains a small amount of ascitic fluid. Musculoskeletal: There is a healed right inferior pubic rami fracture. Diffuse osteopenia. Posterior lumbar spine fixation is noted from L3 through S1. A chronic superior compression deformity of the L2 vertebral body seen with less than 25% loss in height. There is also chronic superior compression deformities of the T7 and T10 vertebral bodies with 30 to 40% loss in vertebral body height. IMPRESSION: 1. Again noted is findings suggestive of colitis/wall thickening involving the hepatic flexure and proximal right colon. 2. Diffuse wall thickening seen at the anorectal junction which could be due to proctocolitis. 3. Unchanged soft tissue density which encases the SMA and occludes the SMV at the level of the mid abdomen. 4. Hepatic steatosis and unchanged pneumobilia. 5. Small to moderate abdominopelvic ascites. 6. Extensive diverticulosis. 7.  Aortic  Atherosclerosis (ICD10-I70.0). 8. Unchanged chronic compression deformities thoracic and lumbar spine and healed fracture deformity of the right inferior pubic rami. Electronically Signed   By: BPrudencio PairM.D.   On: 07/27/2019 21:58   [place]acentesis  Result Date: 07/29/2019 INDICATION: Patient with history of pancreatic cancer, recent GI bleed, colitis, recurrent ascites. Request made for diagnostic and therapeutic paracentesis. EXAM: ULTRASOUND GUIDED DIAGNOSTIC AND THERAPEUTIC PARACENTESIS MEDICATIONS: None COMPLICATIONS: None immediate. PROCEDURE: Informed written consent was obtained from the patient after a discussion of the risks,  benefits and alternatives to treatment. A timeout was performed prior to the initiation of the procedure. Initial ultrasound scanning demonstrates a small amount of ascites within the right lower abdominal quadrant. The right lower abdomen was prepped and draped in the usual sterile fashion. 1% lidocaine was used for local anesthesia. Following this, a 19 gauge, 10-cm, Yueh catheter was introduced. An ultrasound image was saved for documentation purposes. The paracentesis was performed. The catheter was removed and a dressing was applied. The patient tolerated the procedure well without immediate post procedural complication. FINDINGS: A total of approximately 820 cc of light yellow fluid was removed. Samples were sent to the laboratory as requested by the clinical team. IMPRESSION: Successful ultrasound-guided diagnostic and therapeutic paracentesis yielding 820 cc of peritoneal fluid. Read by: Rowe Robert, PA-C Electronically Signed   By: Sandi Mariscal M.D.   On: 07/29/2019 11:07   US Paracentesis  Result Date: 07/22/2019 INDICATION: Patient with history of pancreatic cancer presents for diagnostic and therapeutic paracentesis. EXAM: ULTRASOUND GUIDED DIAGNOSTIC AND THERAPEUTIC PARACENTESIS MEDICATIONS: 5 mL 1% lidocaine COMPLICATIONS: None immediate. PROCEDURE: Informed  written consent was obtained from the patient after a discussion of the risks, benefits and alternatives to treatment. A timeout was performed prior to the initiation of the procedure. Initial ultrasound scanning demonstrates a small amount of ascites within the left lateral abdomen. The left lateral abdomen was prepped and draped in the usual sterile fashion. 1% lidocaine was used for local anesthesia. Following this, a 19 gauge, 7-cm, Yueh catheter was introduced. An ultrasound image was saved for documentation purposes. The paracentesis was performed. The catheter was removed and a dressing was applied. The patient tolerated the procedure well without immediate post procedural complication. FINDINGS: A total of approximately 300 mL of pale, yellow fluid was removed. Samples were sent to the laboratory as requested by the clinical team. IMPRESSION: Successful ultrasound-guided diagnostic and therapeutic paracentesis yielding 300 mL liters of peritoneal fluid. Read by: Brynda Greathouse PA-C Electronically Signed   By: Jacqulynn Cadet M.D.   On: 07/22/2019 12:37    Microbiology: Recent Results (from the past 240 hour(s))  SARS CORONAVIRUS 2 (TAT 6-24 HRS) Nasopharyngeal Nasopharyngeal Swab     Status: None   Collection Time: 07/27/19 10:48 PM   Specimen: Nasopharyngeal Swab  Result Value Ref Range Status   SARS Coronavirus 2 NEGATIVE NEGATIVE Final    Comment: (NOTE) SARS-CoV-2 target nucleic acids are NOT DETECTED. The SARS-CoV-2 RNA is generally detectable in upper and lower respiratory specimens during the acute phase of infection. Negative results do not preclude SARS-CoV-2 infection, do not rule out co-infections with other pathogens, and should not be used as the sole basis for treatment or other patient management decisions. Negative results must be combined with clinical observations, patient history, and epidemiological information. The expected result is Negative. Fact Sheet for  Patients: SugarRoll.be Fact Sheet for Healthcare Providers: https://www.woods-mathews.com/ This test is not yet approved or cleared by the [place] FDA and  has been authorized for detection and/or diagnosis of SARS-CoV-2 by FDA under an Emergency Use Authorization (EUA). This EUA will remain  in effect (meaning this test can be used) for the duration of the COVID-19 declaration under Section 56 4(b)(1) of the Act, 21 U.S.C. section 360bbb-3(b)(1), unless the authorization is terminated or revoked sooner. Performed at [facility], [place] [address] [place], [place] [address]   Culture, body fluid-bottle     Status: None (Preliminary result)   Collection Time: 07/29/19 11:22 AM  Specimen: Peritoneal Washings  Result Value Ref Range Status   Specimen Description   Final    PERITONEAL CYTO Performed at [place] [address], [place], [place] [address]    Special Requests   Final    NONE Performed at [facility], [place] [address] [place], [place] [address]    Culture   Final    NO GROWTH 3 DAYS Performed at [facility], [place] [address] [place], [place] [address]    Report Status PENDING  Incomplete  Gram stain     Status: None   Collection Time: 07/29/19 11:22 AM   Specimen: Peritoneal Washings  Result Value Ref Range Status   Specimen Description PERITONEAL  Final   Special Requests NONE  Final   Gram Stain   Final    FEW WBC PRESENT,BOTH PMN AND MONONUCLEAR NO ORGANISMS SEEN Performed at [facility], [address] [place], [place] [address]    Report Status 07/29/2019 FINAL  Final     Labs: Basic Metabolic Panel: Recent Labs  Lab 07/27/19 1918 07/27/19 1918 07/28/19 0445 07/29/19 0554 07/30/19 0505 07/31/19 0542 08/01/19 0541  NA 138   < > 141 138 138 137 134*  K 4.5   < > 4.2 4.5 4.4 4.3 4.2  CL 106   < > 107 103 103 103 101  CO2 25   < > _0 GLUCOSE 118*   < > 99 102* 98 98 93  BUN 16   < > _1 CREATININE 0.99   < > 0.78 0.89 0.87 0.83 0.83  CALCIUM 8.0*   < > 8.1* 8.4* 8.4* 8.1* 8.1*  MG 2.3  --   --  2.4 2.2  --   --    < > = values in this interval not displayed.   Liver Function Tests: Recent Labs  Lab 07/27/19 1918 07/28/19 0445 07/29/19 0554  AST _2 ALT _3 ALKPHOS 137* 138* 143*  BILITOT 0.7 0.5 0.8  PROT 5.6* 5.2* 5.4*  ALBUMIN 2.7* 2.5* 2.6*   No results for input(s): LIPASE, AMYLASE in the last 168 hours. No results for input(s): AMMONIA in the last 168 hours. CBC: Recent Labs  Lab 07/29/19 0554 07/29/19 1723 07/30/19 0505 07/31/19 0542 08/01/19 0541  WBC 4.4 5.4 4.2 5.0 6.8  NEUTROABS 2.6 3.5  --   --   --   HGB 10.2* 10.7* 9.5* 9.0* 9.3*  HCT 32.6* 34.0* 31.1* 28.9* 29.6*  MCV 94.5 93.2 98.1 96.0 94.9  PLT 104* 116* 110* 116* 130*   Cardiac Enzymes: No results for input(s): CKTOTAL, CKMB, CKMBINDEX, TROPONINI in the last 168 hours. BNP: BNP (last 3 results) No results for input(s): BNP in the last 8760 hours.  ProBNP (last 3 results) No results for input(s): PROBNP in the last 8760 hours.  CBG: No results for input(s): GLUCAP in the last 168 hours.     Signed:  Kayleen Memos, MD [facility] 08/01/2019, 12:52 PM

## 2019-08-01 NOTE — Progress Notes (Deleted)
Dr. Althea Grimmer paged  HR a little elevated 102. Metoprolol given. Pt drowsy but respond to voice and oriented will monitor given Narcan earlier today.

## 2019-08-01 NOTE — Progress Notes (Addendum)
Pt is being discharged home with son and hospice care. Upon discharge, RN noted pt to be lethargic and drowsy. RN completed assessment and notified MD. Orders for Narcan IM administered. Little improvement following administration. MD ordered imaging and discharge has been cancelled at this time. Will continue to monitor pt.

## 2019-08-01 NOTE — Progress Notes (Signed)
Dr. Althea Grimmer paged  HR a little elevated 102. Metoprolol given. Pt drowsy but respond to voice and oriented will monitor given Narcan earlier today.

## 2019-08-01 NOTE — Progress Notes (Signed)
Daily Progress Note   Patient Name: [name]       Date: 08/01/2019 DOB: 05/15/1941  Age: 79 y.o. MRN#: [id number] Attending Physician: Kayleen Memos, DO Primary Care Physician: Leamon Arnt, MD Admit Date: 07/27/2019  Reason for Consultation/Follow-up: Establishing goals of care  Subjective:  awake reasonably alert, attempting to feed herself breakfast. In no distress. Appears pale and weak, doesn't verbalize much. Son [name] at bedside, see below.   Length of Stay: 4  Current Medications: Scheduled Meds:  . sodium chloride   Intravenous Once  . busPIRone  5 mg Oral BID  . DULoxetine  60 mg Oral Daily  . fesoterodine  4 mg Oral Daily  . fluticasone  1 spray Each Nare Daily  . hydrocortisone  25 mg Rectal BID  . latanoprost  1 drop Both Eyes QHS  . levETIRAcetam  250 mg Oral BID  . levothyroxine  175 mcg Oral QAC breakfast  . lipase/protease/amylase  24,000 Units Oral TID WC  . metoprolol tartrate  25 mg Oral BID  . pantoprazole  40 mg Oral Daily  . pregabalin  150 mg Oral BID  . rOPINIRole  0.5 mg Oral QHS    Continuous Infusions:   PRN Meds: ALPRAZolam, HYDROcodone-acetaminophen, lip balm, morphine injection, ondansetron **OR** ondansetron (ZOFRAN) IV, polyvinyl alcohol  Physical Exam         Awake sitting up in bed No distress noted Regular work of breathing Abdomen not distended No edema noted  Vital Signs: BP 124/70 (BP Location: Left Arm)   Pulse 84   Temp 97.6 F (36.4 C) (Oral)   Resp 16   Ht 5\' 1"  (1.549 m)   Wt 62.1 kg   SpO2 95%   BMI 25.87 kg/m  SpO2: SpO2: 95 % O2 Device: O2 Device: Room Air O2 Flow Rate:    Intake/output summary:   Intake/Output Summary (Last 24 hours) at 08/01/2019 1151 Last data filed at 08/01/2019 0900 Gross per 24  hour  Intake 720 ml  Output --  Net 720 ml   LBM: Last BM Date: 07/31/19 Baseline Weight: Weight: 62.1 kg Most recent weight: Weight: 62.1 kg       Palliative Assessment/Data:      Patient Active Problem List   Diagnosis Date Noted  . Acute GI bleeding 07/28/2019  .  Fatigue associated with anemia 07/28/2019  . Colitis 07/28/2019  . Blood in stool 06/25/2019  . Rectal bleeding 06/25/2019  . Diarrhea 03/20/2019  . Acute colitis 03/20/2019  . Acute renal failure (ARF) ([place]) 03/20/2019  . Aortic atherosclerosis ([place]) 01/05/2019  . 3-vessel CAD, on CT chest 01/05/2019  . Primary open angle glaucoma (POAG) of both eyes, moderate stage 04/18/2018  . Pseudophakia, right eye 04/18/2018  . History of lumbar fusion 11/01/2017  . Tardive dyskinesia, vitamin E   . History of stroke   . Mitral valve prolapse   . Chronic maxillary sinusitis, using NetiPot, flonase   . Superior mesenteric artery stenosis ([place]) 05/21/2017  . Chronic left sacral fracture with mild edema, MRI 07/2017 01/07/2017  . Thrombocytopenia ([place]), mild, chronic 01/07/2017  . HLD (hyperlipidemia), on Lovastatin 07/28/2015  . Fatty liver, on CT 09/09/14 09/09/2014  . Exocrine pancreatic insufficiency, Rx Creon 12/30/2013  . Carcinoma of head of pancreas [facility]), followed by Dr [name], Oncology 07/01/2013  . Peripheral neuropathy, Lyrica 400 mg BID 08/30/2012  . Nuclear sclerosis, left 04/13/2011  . 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 Colon polyps 06/05/2008  . Osteoarthritis, mulitple sites 06/04/2008  . Chronic diarrhea, intermittent 04/22/2008  . Hypothyroidism, on Levothryoxine 02/26/2007  . Adjustment disorder with mixed anxiety and depressed mood, on Cymbalta and Buspar 02/26/2007  . Essential hypertension 02/26/2007  . GERD, on Prilosec 02/26/2007  . Osteoporosis, on Calcium and Vitamin D 02/26/2007    Palliative Care Assessment & Plan   Patient Profile:    Assessment:  pancreatic cancer Failure  to thrive GI bleed.  PPS 40%  Recommendations/Plan:   ongoing code status and goals of care discussions with son [name] held again. We reviewed about full code versus DNR. We reviewed about home with hospice. End of life signs and symptoms discussed in detail. Answered all of his questions to the best of my ability. He would like to discuss further with the patient about her decisions regarding whether she would want to go to her own home in [place], [place] with hospice versus his (Roger's) home in [place], [place] with hospice.   We talked about durable medical equipment, we talked about typical  level of support when a patient has home with hospice. Eligibility for a residential hospice setting were also discussed with him. All of his questions answered to the best of my ability.    Code Status:    Code Status Orders  (From admission, onward)         Start     Ordered   07/28/19 0356  Full code  Continuous     07/28/19 0357        Code Status History    Date Active Date Inactive Code Status Order ID Comments User Context   03/20/2019 0312 03/31/2019 2023 Full Code [id number]  Elwyn Reach, MD Inpatient   10/25/2017 2037 10/28/2017 1659 Full Code [id number]  Herbie Saxon Inpatient   01/08/2017 0429 01/11/2017 1740 Full Code [id number]  Ivor Costa, MD ED   12/15/2016 2153 12/16/2016 1902 Full Code [id number]  Roseanne Kaufman, MD Inpatient   11/22/2016 0144 11/22/2016 1955 Full Code [id number]  Rise Patience, MD Inpatient   11/02/2016 0111 11/05/2016 1836 Full Code [id number]  Roseanne Kaufman, MD Inpatient   08/16/2015 0514 08/19/2015 2008 Full Code [id number]  Norval Morton, MD ED   07/29/2015 0005 07/29/2015 1710 Full Code [id number]  Rise Patience, MD Inpatient   08/07/2013 1559 09/12/2013 1728 Full Code  [id number]  [name], MD Inpatient   Advance Care Planning Activity       Prognosis:   < 3 months  Discharge Planning:  Home with Hospice  Care plan was discussed  with patient, also discussed with son [name] outside the room.    Thank you for allowing the Palliative Medicine Team to assist in the care of this patient.   Time In: 11 Time Out: 11.35 Total Time 35 Prolonged Time Billed  no       Greater than 50%  of this time was spent counseling and coordinating care related to the above assessment and plan.  Loistine Chance, MD  Please contact Palliative Medicine Team phone at [phone] for questions and concerns.

## 2019-08-01 NOTE — Progress Notes (Signed)
Hydrologist [facility]) Hospital Liaison: RN note     Notified by [facility] manager of patient/family request for [facility] services at home after discharge.      Writer spoke with  Pandora Leiter, [name] to initiate education related to hospice philosophy, services and team approach to care. Roger verbalized understanding of information given. Per discussion, plan is for discharge to home later today. She will be going to son's home in [place]. [address], [place], [place] [address].  Patient will need prescriptions for discharge comfort medications.        Please call with any hospice related questions. [facility] can be reached at [phone].        Thank you for this referral.        Farrel Gordon, RN, CCM [place] (listed on AMION under Hospice and [place] [facility] [place])   [phone]

## 2019-08-01 NOTE — TOC Transition Note (Signed)
Transition of Care [facility]) - CM/SW Discharge Note   Patient Details  Name: [name] MRN: [id number] Date of Birth: [birth year]  Transition of Care [facility]) CM/SW Contact:  Trish Mage, LCSW Phone Number: 08/01/2019, 11:51 AM   Clinical Narrative:   Patient and family have chosen to return home with home hospice services through [facility].  Spoke to son, who plans to transport patient home when she is less sedated this afternoon. If for some reason the transportation does not come together, will send on PTAR. TOC sign off.     Final next level of care: Home w Hospice Care Barriers to Discharge: No Barriers Identified   Patient Goals and CMS Choice Patient states their goals for this hospitalization and ongoing recovery are:: To go home with either hospice or home health with palliative. CMS Medicare.gov Compare Post Acute Care list provided to:: Patient Choice offered to / list presented to : Patient, Adult Children  Discharge Placement                       Discharge Plan and Services     Post Acute Care Choice: (To be determined)                               Social Determinants of Health (SDOH) Interventions     Readmission Risk Interventions Readmission Risk Prevention Plan 07/31/2019  Transportation Screening Complete  PCP or Specialist Appt within 3-5 Days Complete  HRI or [place] Complete  Social Work Consult for [place] Planning/Counseling Complete  Palliative Care Screening Complete  Medication Review [employer]) Referral to Pharmacy  Some recent data might be hidden

## 2019-08-01 NOTE — Telephone Encounter (Signed)
[name] from [facility] calling about referral put in for hospice at home. She needs to know if Dr. Jonni Sanger can serve as pts hospice attending. She also needs certification of terminal illness. Please advise.

## 2019-08-01 NOTE — Telephone Encounter (Signed)
Scheduled appt per 3/4 sch msg. Left voicemail with appt details.

## 2019-08-01 NOTE — Progress Notes (Addendum)
HEMATOLOGY-ONCOLOGY PROGRESS NOTE  SUBJECTIVE: Resting quietly this morning.  Had 2 loose stools overnight with a small amount of blood.  Still having mild abdominal discomfort.  No nausea or vomiting.  She has no other complaints this morning.  PHYSICAL EXAMINATION:  Vitals:   07/31/19 2104 08/01/19 0411  BP: 128/85 124/70  Pulse: 95 84  Resp: 16 16  Temp: (!) 97.5 F (36.4 C) 97.6 F (36.4 C)  SpO2: 97% 95%   Filed Weights   07/27/19 1854  Weight: 136 lb 14.5 oz (62.1 kg)    Intake/Output from previous day: 03/03 0701 - 03/04 0700 In: 720 [P.O.:720] Out: -   ABDOMEN: Distended with ascites, mild tenderness diffusely, worse in the right lower abdomen Vascular: No leg edema  LABORATORY DATA:  I have reviewed the data as listed CMP Latest Ref Rng & Units 08/01/2019 07/31/2019 07/30/2019  Glucose 70 - 99 mg/dL 93 98 98  BUN 8 - 23 mg/dL 17 16 16   Creatinine 0.44 - 1.00 mg/dL 0.83 0.83 0.87  Sodium 135 - 145 mmol/L 134(L) 137 138  Potassium 3.5 - 5.1 mmol/L 4.2 4.3 4.4  Chloride 98 - 111 mmol/L 101 103 103  CO2 22 - 32 mmol/L 24 26 25   Calcium 8.9 - 10.3 mg/dL 8.1(L) 8.1(L) 8.4(L)  Total Protein 6.5 - 8.1 g/dL - - -  Total Bilirubin 0.3 - 1.2 mg/dL - - -  Alkaline Phos 38 - 126 U/L - - -  AST 15 - 41 U/L - - -  ALT 0 - 44 U/L - - -    Lab Results  Component Value Date   WBC 6.8 08/01/2019   HGB 9.3 (L) 08/01/2019   HCT 29.6 (L) 08/01/2019   MCV 94.9 08/01/2019   PLT 130 (L) 08/01/2019   NEUTROABS 3.5 07/29/2019    CT ABDOMEN PELVIS W CONTRAST  Result Date: 07/27/2019 CLINICAL DATA:  diarrhea and rectal bleeding, question of diverticulitis EXAM: CT ABDOMEN AND PELVIS WITH CONTRAST TECHNIQUE: Multidetector CT imaging of the abdomen and pelvis was performed using the standard protocol following bolus administration of intravenous contrast. CONTRAST:  166mL OMNIPAQUE IOHEXOL 300 MG/ML  SOLN COMPARISON:  June 11, 2019 FINDINGS: Lower chest: The visualized heart size  within normal limits. No pericardial fluid/thickening. Again noted is calcifications of the mitral valve and coronary artery calcifications. There is streaky atelectasis seen at both lung bases. Hepatobiliary: There is diffuse low density seen throughout the liver parenchyma. Again noted is extensive pneumobilia. The portal vein appears to be patent the patient is status post cholecystectomy. Pancreas: The patient is status post Whipple with atrophy of the pancreatic body and surgical clips around the diminutive pancreatic head. Spleen: Normal in size without focal abnormality. Adrenals/Urinary Tract: Both adrenal glands appear normal. The kidneys and collecting system appear normal without evidence of urinary tract calculus or hydronephrosis. Bladder is unremarkable. Stomach/Bowel: Again noted is a prior gastrojejunostomy. The remainder of the small bowel is unremarkable. There is diffuse wall thickening seen surrounding the right colon especially at the hepatic flexure with surrounding mild fat stranding changes, best seen on series 2, image 43. There is extensive sigmoid colonic diverticulosis. No pericolonic free fluid or free air is seen. There appears to be mild wall thickening seen around the anorectal junction with mild perirectal edema. Vascular/Lymphatic: There is a small to moderate amount of abdominopelvic ascites. Scattered aortic atherosclerosis is noted. Again noted is a soft tissue density which surrounds the SMA and SMV at the level of  the mid abdomen, series 2, image 45 which is not significantly changed since the prior exam. The SMA, however does appear to be patent. The SMV however terminates within this soft tissue density and appears to be occluded. Reproductive: The patient is status post hysterectomy. No adnexal masses or collections seen. Other: A small anterior umbilical hernia seen which contains a small amount of ascitic fluid. Musculoskeletal: There is a healed right inferior pubic rami  fracture. Diffuse osteopenia. Posterior lumbar spine fixation is noted from L3 through S1. A chronic superior compression deformity of the L2 vertebral body seen with less than 25% loss in height. There is also chronic superior compression deformities of the T7 and T10 vertebral bodies with 30 to 40% loss in vertebral body height. IMPRESSION: 1. Again noted is findings suggestive of colitis/wall thickening involving the hepatic flexure and proximal right colon. 2. Diffuse wall thickening seen at the anorectal junction which could be due to proctocolitis. 3. Unchanged soft tissue density which encases the SMA and occludes the SMV at the level of the mid abdomen. 4. Hepatic steatosis and unchanged pneumobilia. 5. Small to moderate abdominopelvic ascites. 6. Extensive diverticulosis. 7.  Aortic Atherosclerosis (ICD10-I70.0). 8. Unchanged chronic compression deformities thoracic and lumbar spine and healed fracture deformity of the right inferior pubic rami. Electronically Signed   By: Prudencio Pair M.D.   On: 07/27/2019 21:58   US Paracentesis  Result Date: 07/29/2019 INDICATION: Patient with history of pancreatic cancer, recent GI bleed, colitis, recurrent ascites. Request made for diagnostic and therapeutic paracentesis. EXAM: ULTRASOUND GUIDED DIAGNOSTIC AND THERAPEUTIC PARACENTESIS MEDICATIONS: None COMPLICATIONS: None immediate. PROCEDURE: Informed written consent was obtained from the patient after a discussion of the risks, benefits and alternatives to treatment. A timeout was performed prior to the initiation of the procedure. Initial ultrasound scanning demonstrates a small amount of ascites within the right lower abdominal quadrant. The right lower abdomen was prepped and draped in the usual sterile fashion. 1% lidocaine was used for local anesthesia. Following this, a 19 gauge, 10-cm, Yueh catheter was introduced. An ultrasound image was saved for documentation purposes. The paracentesis was performed. The  catheter was removed and a dressing was applied. The patient tolerated the procedure well without immediate post procedural complication. FINDINGS: A total of approximately 820 cc of light yellow fluid was removed. Samples were sent to the laboratory as requested by the clinical team. IMPRESSION: Successful ultrasound-guided diagnostic and therapeutic paracentesis yielding 820 cc of peritoneal fluid. Read by: Rowe Robert, PA-C Electronically Signed   By: Sandi Mariscal M.D.   On: 07/29/2019 11:07   US Paracentesis  Result Date: 07/22/2019 INDICATION: Patient with history of pancreatic cancer presents for diagnostic and therapeutic paracentesis. EXAM: ULTRASOUND GUIDED DIAGNOSTIC AND THERAPEUTIC PARACENTESIS MEDICATIONS: 5 mL 1% lidocaine COMPLICATIONS: None immediate. PROCEDURE: Informed written consent was obtained from the patient after a discussion of the risks, benefits and alternatives to treatment. A timeout was performed prior to the initiation of the procedure. Initial ultrasound scanning demonstrates a small amount of ascites within the left lateral abdomen. The left lateral abdomen was prepped and draped in the usual sterile fashion. 1% lidocaine was used for local anesthesia. Following this, a 19 gauge, 7-cm, Yueh catheter was introduced. An ultrasound image was saved for documentation purposes. The paracentesis was performed. The catheter was removed and a dressing was applied. The patient tolerated the procedure well without immediate post procedural complication. FINDINGS: A total of approximately 300 mL of pale, yellow fluid was removed. Samples were  sent to the laboratory as requested by the clinical team. IMPRESSION: Successful ultrasound-guided diagnostic and therapeutic paracentesis yielding 300 mL liters of peritoneal fluid. Read by: Brynda Greathouse PA-C Electronically Signed   By: Jacqulynn Cadet M.D.   On: 07/22/2019 12:37    ASSESSMENT AND PLAN: 1. 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  CT abdomen/pelvis 01/19/2015 with no evidence of recurrent/metastatic disease.  CT abdomen/pelvis 07/20/2018-infiltrating soft tissue at the pancreas resection bed with involvement of the SMA, occlusion of the SMV with collaterals. New L2 compression fracture  SBRT beginning 08/21/2018, completed 08/31/2018, 5 fractions  CT 12/03/2018-no change in abnormal soft tissue encasing the SMA  CT 03/27/2019-new wall thickening and mucosal enhancement in the sigmoid and rectum, hepatic flexure wall thickening has resolved new or progressive ascending colonic wall thickening. Persistent soft tissue surrounding the SMA-stable, no ascites or evidence of peritoneal metastases, enlarging ventral hernias  CT abdomen/pelvis 06/11/2019-ascending colitis, enlarging soft tissue surrounding the SMV and SMA with occlusion of the SMV and ascites, diverticulosis  2. Nausea following the Whipple procedure-improved 3. Mild thrombocytopenia , Chronic  4.Pain secondary to local recurrence of pancreas cancer 5. Diarrhea-etiology unclear, Giardia antigen positive 03/21/2019  6.  GI bleeding 7.  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  Cytology negative on 07/22/2019 and 07/29/2019  Ms. Sheldon appears stable.  Her hemoglobin is slightly better today.  She does not appear to have a significant amount of diarrhea at present.  Cytology from the paracentesis on 07/29/2019 showed reactive mesothelial cells.  The etiology of the bowel wall thickening, diarrhea, and GI bleeding is unclear.  Discussed with Dr. Watt Climes who thinks the bleeding may be related to hemorrhoids.  I do not  feel chemotherapy will help her symptoms.  She is not a candidate for anticoagulation therapy.  The ascites may be related to the SMV occlusion.  I recommend home hospice care.  This has been discussed with the patient and her son.  The patient's son understands the poor prognosis.    From oncology standpoint, the patient may discharged home when otherwise medically stable.  We will arrange for follow-up at the cancer center next week.    LOS: 4 days   Mikey Bussing, DNP, AGPCNP-BC, AOCNP 08/01/19 Ms. Lickteig was interviewed and examined.  She appears unchanged.  She has evidence of persistent ascites.  I discussed the case with Dr. Watt Climes yesterday.  We may consider a trial of Colestid if the diarrhea increases.  She has not been documented to have significant diarrhea during this admission.  The plan is for discharge to home with hospice care.  Outpatient follow-up will be scheduled at the cancer center within the next 1-2 weeks.

## 2019-08-01 NOTE — Progress Notes (Signed)
Later this afternoon, Ms. Lazer was noted to be drowsier and minimally interactive which is a decline from her baseline.  She received one dose of IM Narcan with minimal improvement.  Spoke with her son [name] at bedside.  Decision was made to cancel her discharge and obtain stat CT head to further evaluate.  Had some wheezing on exam with concern for aspiration.  Reviewed CT head and CT chest and agree with radiology assessment.  No acute abnormalities.  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  Updated her son [name].   Code status has been changed to DNR per patient's and family request.  We will continue to closely monitor and update family with any changes.

## 2019-08-02 ENCOUNTER — Other Ambulatory Visit: Payer: Self-pay

## 2019-08-02 ENCOUNTER — Encounter: Payer: Self-pay | Admitting: Family Medicine

## 2019-08-02 ENCOUNTER — Encounter: Payer: Self-pay | Admitting: *Deleted

## 2019-08-02 MED ORDER — ROPINIROLE HCL 0.5 MG PO TABS: 0.5000 mg | ORAL_TABLET | Freq: Every day | ORAL | 1 refills | Status: AC

## 2019-08-02 NOTE — Discharge Instructions (Signed)
Pancreatic Cancer  The pancreas is a gland in the abdomen between the stomach and the spine. The pancreas makes hormones that control blood sugar and helps the body use and store energy that comes from food. The pancreas also makes enzymes that help digest food. Pancreatic cancer is when there is a tumor in the pancreas that is cancerous (malignant). There are two types of pancreatic cancer:  Exocrine. This is the most common type.  Endocrine. This is also called islet cell cancer. Pancreatic cancer can spread (metastasize) to other parts of the body. What are the causes? The exact cause of this condition is not known. What increases the risk? The following factors may make you more likely to develop this condition:  Being over 65 years old.  Smoking cigarettes.  Having a family history of cancer of the pancreas, colon, or ovaries.  Having diabetes.  Having long-term inflammation of the pancreas (chronic pancreatitis).  Being exposed to certain chemicals.  Being obese and having a decreased level of physical activity.  Eating a diet that is high in fat and red meat.  Having certain hereditary conditions. What are the signs or symptoms? In the early stages, there are often no symptoms of this condition. As the cancer gets worse, symptoms may vary depending on the type of pancreatic cancer you have. Common symptoms include:  Nausea and vomiting.  Loss of appetite and unintended weight loss.  Pain in the upper abdomen or upper back.  Skin or the white parts of the eyes turning yellow (jaundice).  Fatigue. Other symptoms include:  Itchy skin.  Dark urine.  Stools that are light-colored and greasy-looking, or stools that are black and tarry-looking.  A lump under the rib cage on the right side.  High blood sugar (hyperglycemia). This may cause increased thirst and frequent urination.  Low blood sugar (hypoglycemia). This may cause confusion, sweating, and a fast  heartbeat.  Depression. How is this diagnosed? This condition may be diagnosed based on your medical history and a physical exam. Your health care provider may check your:  Skin and eyes for signs of jaundice.  Abdomen for any changes in the areas near the pancreas. Your health care provider may also check for excess fluid in the abdomen. You may also have other tests, including:  Blood and urine tests.  Genetic testing.  Ultrasound.  CT scan.  MRI.  A biopsy. A sample of pancreatic tissue would be removed and looked at under a microscope. If pancreatic cancer is diagnosed, it will be staged to determine its severity and extent. Staging is an assessment of:  The size of the tumor.  If the cancer has spread.  Where the cancer has spread. How is this treated? Depending on the type and stage of your pancreatic cancer, treatment may include:  Surgery to remove all or part of the pancreas, or to remove the tumor.  Chemotherapy. This uses medicine to destroy the cancer cells.  Radiation therapy. This uses high-energy beams to kill cancer cells.  Medicine to attack a tumor's genes and proteins (targeted therapy). These medicines attack the genes and proteins that allow a tumor to grow while limiting damage to healthy cells.  Participating in clinical trials to see if new (experimental) treatments are effective.  Medicines to help manage pain and other symptoms. Your health care provider may recommend a combination of surgery, radiation therapy, and chemotherapy. You may be referred to a health care provider who specializes in cancer (oncologist). Follow these instructions   at home: Medicines  Take over-the-counter and prescription medicines only as told by your health care provider.  Ask your health care provider about changing or stopping your regular medicines. This is especially important if you are taking diabetes medicines or blood thinners.  Do not take dietary  supplements or herbal medicines unless your health care provider tells you to take them. Some supplements can interfere with how well the treatment works.  Ask your health care provider if the medicine prescribed to you: ? Requires you to avoid driving or using heavy machinery. ? Can cause constipation. You may need to take these actions to prevent or treat constipation:  Drink enough fluid to keep your urine pale yellow.  Take over-the-counter or prescription medicines.  Eat foods that are high in fiber, such as beans, whole grains, and fresh fruits and vegetables.  Limit foods that are high in fat and processed sugars, such as fried or sweet foods. Lifestyle  Get enough sleep on a regular basis. Most adults need 6-8 hours of sleep each night. During treatment, you may need more sleep.  Rest as told by your health care provider.  Consider joining a cancer support group. Ask your health care provider for more information about local and online support groups. This may help you learn to cope with the stress of having pancreatic cancer.  Do not use any products that contain nicotine or tobacco, such as cigarettes, e-cigarettes, and chewing tobacco. If you need help quitting, ask your health care provider. Eating and drinking  Try to eat regular, healthy meals. Some of your treatments might affect your appetite. If you are having problems eating or with your appetite, ask to meet with a food and nutrition specialist (dietitian).  Do not drink alcohol. General instructions  Return to your normal activities as told by your health care provider. Ask your health care provider what activities are safe for you.  Work with your health care provider to manage any side effects of your treatment.  Keep all follow-up visits as told by your health care provider. This is important. Where to find more information  American Cancer Society: https://www.cancer.org  National Cancer Institute (NCI):  https://www.cancer.gov Contact a health care provider if:  You have unexplained weight loss. Get help right away if:  Your pain suddenly gets worse.  Your skin or eyes turn more yellow.  You cannot eat or drink without vomiting.  You have: ? Trouble breathing. ? Chest pain or an irregular heartbeat. ? Blood in your vomit or dark, tarry stools. ? New fatigue or weakness. ? Abdominal bloating or pain. These symptoms may represent a serious problem that is an emergency. Do not wait to see if the symptoms will go away. Get medical help right away. Call your local emergency services (911 in the [place] Do not drive yourself to the hospital. Summary  Pancreatic cancer is a tumor in the pancreas that is cancerous (malignant).  Risk factors include having a family history of cancer of the pancreas, colon, or ovaries. Risk factors also include having long-term inflammation of the pancreas (chronic pancreatitis) and diabetes.  Treatment may include a combination of surgery, medicine to destroy cancer cells (chemotherapy), and high-energy beams to kill cancer cells (radiation therapy).  Consider joining a cancer support group. This may help you learn to cope with the stress of having pancreatic cancer.  Keep all follow-up visits as told by your health care provider. This is important. This information is not intended to replace advice   given to you by your health care provider. Make sure you discuss any questions you have with your health care provider. Document Revised: 08/16/2018 Document Reviewed: 08/23/2018 [employer] Patient Education  2020 [employer]  

## 2019-08-02 NOTE — Telephone Encounter (Signed)
Yes, that is fine.  I've reviewed hospital notes and records. 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   Thanks.

## 2019-08-02 NOTE — Discharge Summary (Addendum)
Discharge Summary  [name] ASN:[id number] DOB: [birth year]  PCP: Leamon Arnt, MD  Admit date: 07/27/2019 Discharge date: 08/02/2019  Time spent: 35 minutes  Recommendations for Outpatient Follow-up:  1. Continue home with hospice care. 2. Follow up with Dr. [name] in 1-2 weeks [name] Ade, RN  Registered Nurse    Progress Notes     Signed  Encounter Date:  08/02/2019      Related encounter: Documentation from 08/02/2019 in [place]         Per Dr. [name], needs OV in 1-2 weeks with him or NP. Scheduling message sent w/request to call son w/appt.          Discharge Diagnoses:  Active Hospital Problems   Diagnosis Date Noted   Acute GI bleeding 07/28/2019   Fatigue associated with anemia 07/28/2019   Colitis 07/28/2019    Resolved Hospital Problems  No resolved problems to display.    Discharge Condition: Stable.  Diet recommendation: Pleasure feedings.  Vitals:   08/02/19 0034 08/02/19 0541  BP: 130/67 131/67  Pulse: 96 87  Resp: 20 16  Temp: 98.4 F (36.9 C) 97.7 F (36.5 C)  SpO2: 97% 97%    History of present illness:  [name] is a 79 y.o. female with medical history significant of GERD, essential hypertension, peripheral neuropathy, chronic anxiety/depression, hypothyroidism, chronic diarrhea, pancreatic cancer and history of GI bleed who presented to [facility] ED for evaluation of acute lower GI bleed.  Associated with abdominal pain and generalized weakness.  TRH asked to admit.    Seen by GI and oncology.  Palliative care team consulted due to poor prognosis.  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  Not a candidate for anticoagulation due to GI bleed.  CT AP with contrast done on 07/27/2019 showed unchanged soft tissue density which encases the SMA and occludes the SMV at the level of the mid abdomen.  Findings suggestive  of colitis/wall thickening involving the hepatic flexure and proximal right colon.  Diffuse wall thickening seen at the anorectal junction which could be due to proctolitis.  Hepatic steatosis and unchanged pneumobilia.  Small to moderate abdominal pelvic ascites.  Extensive diverticulosis.  Aortic atherosclerosis.  Unchanged chronic compression deformity thoracic and lumbar spine and healed fracture deformity of the right inferior pubic rami.  S/p post paracentesis 07/29/19 by IR: cytology showed reactive mesothelial cells.  08/01/19: Seen and examined at her bedside.  She reports mild diffuse abdominal pain which she rates at 3 out of 10.  Denies nausea or vomiting.  Had 2 loose stools overnight with a small amount of blood.  Hemoglobin stable and mildly trending up 9.3 from 9.0.  Palliative care team met with patient and family.  Decision was made to discharge to home with hospice.  Patient and family changed code status to DNR on 08/01/19.  Discharge to home was delayed due to excessive drowsiness.  Received 1 dose of IM Narcan 0.4 mg once with minimal improvement.  Reviewed CT head and CT chest and agree with radiology assessment.  No acute abnormalities.  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  Updated her son [name].   08/02/19:  Seen and examined.  Feels better but weak.  Denies any abdominal pain this morning.  Denies nausea.  Vital signs reviewed and are stable.  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 Moderate protein calorie malnutrition  Plan:  Continue hospice care.  Defer medication management to Hospice care.  All focus on comfort care.   Procedures:  Paracentesis by IR 07/29/19  Consultations:  Medical oncology  GI  Palliative medicine team  IR  Discharge Exam: BP 131/67 (BP Location: Left Arm)    Pulse 87    Temp 97.7 F (36.5 C)    Resp 16    Ht '5\' 1"'  (1.549 m)    Wt 62.1 kg    SpO2 97%    BMI 25.87 kg/m   General: 79 y.o. year-old female well developed well nourished in no acute distress.  Alert and oriented x3.  Cardiovascular: Regular rate and rhythm with no rubs or gallops.  No thyromegaly or JVD noted.    Respiratory: Clear to auscultation with no wheezes or rales. Good inspiratory effort.  Abdomen: Soft non tender this morning with light palpation.  Bowel sounds are present.   Musculoskeletal: No lower extremity edema. 2/4 pulses in all 4 extremities.  Psychiatry: Mood is appropriate for condition and setting  Discharge Instructions You were cared for by a hospitalist during your hospital stay. If you have any questions about your discharge medications or the care you received while you were in the hospital after you are discharged, you can call the unit and asked to speak with the hospitalist on call if the hospitalist that took care of you is not available. Once you are discharged, your primary care physician will handle any further medical issues. Please note that NO REFILLS for any discharge medications will be authorized once you are discharged, as it is imperative that you return to your primary care physician (or establish a relationship with a primary care physician if you do not have one) for your aftercare needs so that they can reassess your need for medications and monitor your lab values.   Allergies as of 08/02/2019      Reactions   Carafate [sucralfate] Rash   Metoclopramide Hcl Other (See Comments)   Shaking; caused tardive  dyskinesia   Opium Rash   Red rash around both eyes Opium 10 mg/mg 1% /Tinc   Other Other (See Comments)   According to patient Trilafor and Reglan cause Tardive Dyskinesia   Niacin Other (See Comments)   shaking   Trovan [alatrofloxacin Mesylate] Other (See Comments)   Caused shaking and nervousness   Benzocaine-resorcinol Rash   Celecoxib Other (See Comments)   unknown   Erythromycin Base Rash   Glucosamine Other (See Comments)   unknown   Nortriptyline Other (See Comments)   Dizziness    Phenazopyridine Hcl Other (See Comments)   unknown   Sulfa Antibiotics Nausea And Vomiting, Rash   Sulfonamide Derivatives Nausea And Vomiting, Rash      Medication List    STOP taking these medications   acetaminophen 500 MG tablet Commonly known as: TYLENOL   aspirin EC 81 MG tablet   atenolol 50 MG tablet Commonly known as: TENORMIN   lovastatin 40 MG tablet Commonly known as: MEVACOR   terbinafine 250 MG tablet Commonly known as:  LAMISIL     TAKE these medications   ALPRAZolam 0.25 MG tablet Commonly known as: XANAX Take 1 tablet (0.25 mg total) by mouth 2 (two) times daily as needed for up to 5 days for anxiety.   betamethasone dipropionate 0.05 % lotion Apply 1 application topically daily as needed (itching).   busPIRone 5 MG tablet Commonly known as: BUSPAR Take 1 tablet by mouth twice daily   Calcium 600+D 600-800 MG-UNIT Tabs Generic drug: Calcium Carb-Cholecalciferol Take 1 tablet by mouth daily.   cholecalciferol 1000 units tablet Commonly known as: VITAMIN D Take 1,000 Units by mouth 2 (two) times daily.   Coenzyme Q10 300 MG Caps Take 300 mg by mouth daily.   Creon 24000-76000 units Cpep Generic drug: Pancrelipase (Lip-Prot-Amyl) Take 2 capsules by mouth 3 (three) times daily with meals.   diphenoxylate-atropine 2.5-0.025 MG tablet Commonly known as: LOMOTIL Take 1-2 tablets by mouth 4 (four) times daily as needed for diarrhea or loose stools.    DULoxetine 60 MG capsule Commonly known as: CYMBALTA Take 1 capsule (60 mg total) by mouth daily.   fluticasone 50 MCG/ACT nasal spray Commonly known as: FLONASE Place 1 spray into both nostrils daily.   HYDROcodone-acetaminophen 5-325 MG tablet Commonly known as: NORCO/VICODIN Take 1 tablet by mouth 2 (two) times daily as needed for up to 5 days for moderate pain or severe pain. What changed:   when to take this  reasons to take this   hydrocortisone 25 MG suppository Commonly known as: ANUSOL-HC Place 1 suppository (25 mg total) rectally 2 (two) times daily.   latanoprost 0.005 % ophthalmic solution Commonly known as: XALATAN Place 1 drop into both eyes at bedtime.   levETIRAcetam 250 MG tablet Commonly known as: Keppra Take 1 tablet (250 mg total) by mouth 2 (two) times daily.   levothyroxine 175 MCG tablet Commonly known as: SYNTHROID TAKE 1 TABLET BY MOUTH  DAILY BEFORE BREAKFAST What changed:   how much to take  how to take this  when to take this  additional instructions   loperamide 2 MG tablet Commonly known as: IMODIUM A-D take 1 tablet by mouth as needed for diarrhea or loose stools *maximum of 8 tablets per day* What changed: See the new instructions.   loratadine 10 MG tablet Commonly known as: CLARITIN Take 10 mg by mouth daily.   metoprolol tartrate 25 MG tablet Commonly known as: LOPRESSOR Take 1 tablet (25 mg total) by mouth 2 (two) times daily.   ondansetron 8 MG tablet Commonly known as: ZOFRAN Take 1 tablet (8 mg total) by mouth every 8 (eight) hours. What changed:   when to take this  reasons to take this   pantoprazole 40 MG tablet Commonly known as: PROTONIX Take 40 mg by mouth daily.   pregabalin 150 MG capsule Commonly known as: LYRICA Take 1 capsule (150 mg total) by mouth 2 (two) times daily.   Refresh Tears 0.5 % Soln Generic drug: carboxymethylcellulose Place 1 drop into both eyes 2 (two) times daily.    rOPINIRole 0.5 MG tablet Commonly known as: REQUIP TAKE 1 TABLET BY MOUTH AT BEDTIME   simethicone 125 MG chewable tablet Commonly known as: MYLICON Chew 606 mg by mouth daily as needed for flatulence.   tolterodine 4 MG 24 hr capsule Commonly known as: DETROL LA Take 4 mg by mouth daily.   triamcinolone ointment 0.5 % Commonly known as: KENALOG Apply 1 application topically 2 (two) times daily. What changed:  when to take this  reasons to take this   vitamin C 1000 MG tablet Take 1,000 mg by mouth daily.   VITAMIN E PO Take 1 tablet by mouth daily.      Allergies  Allergen Reactions   Carafate [Sucralfate] Rash   Metoclopramide Hcl Other (See Comments)    Shaking; caused tardive dyskinesia   Opium Rash    Red rash around both eyes Opium 10 mg/mg 1% /Tinc   Other Other (See Comments)    According to patient Trilafor and Reglan cause Tardive Dyskinesia   Niacin Other (See Comments)    shaking   Trovan [Alatrofloxacin Mesylate] Other (See Comments)    Caused shaking and nervousness   Benzocaine-Resorcinol Rash   Celecoxib Other (See Comments)    unknown   Erythromycin Base Rash   Glucosamine Other (See Comments)    unknown   Nortriptyline Other (See Comments)    Dizziness    Phenazopyridine Hcl Other (See Comments)    unknown   Sulfa Antibiotics Nausea And Vomiting and Rash   Sulfonamide Derivatives Nausea And Vomiting and Rash   Follow-up Information    Ladell Pier, MD. Call in 1 day(s).   Specialty: Oncology Why: Please follow up with Dr. [name] in 1-2 weeks at his office's request. Contact information: [place] [address] [phone]            The results of significant diagnostics from this hospitalization (including imaging, microbiology, ancillary and laboratory) are listed below for reference.    Significant Diagnostic Studies: CT HEAD WO CONTRAST  Result Date: 08/01/2019 CLINICAL  DATA:  Encephalopathy.  Confusion. EXAM: CT HEAD WITHOUT CONTRAST TECHNIQUE: Contiguous axial images were obtained from the base of the skull through the vertex without intravenous contrast. COMPARISON:  03/10/2017 FINDINGS: Brain: No sign of acute infarction. Old small vessel infarction of the right cerebellum, not present in 2018. Generalized cerebellar atrophy. Cerebral hemispheres show generalized atrophy with chronic small-vessel changes of the white matter. No cortical or large vessel territory infarction. No mass lesion, hemorrhage, hydrocephalus or extra-axial collection. Vascular: There is atherosclerotic calcification of the major vessels at the base of the brain. Skull: Negative Sinuses/Orbits: Clear/normal Other: None IMPRESSION: No acute or reversible finding. Generalized brain atrophy. Chronic small-vessel ischemic changes of the cerebral hemispheric white matter. 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 Electronically Signed   By: Nelson Chimes M.D.   On: 08/01/2019 17:06   CT CHEST WO CONTRAST  Result Date: 08/01/2019 CLINICAL DATA:  Altered mental status. COPD exacerbation. EXAM: CT CHEST WITHOUT CONTRAST TECHNIQUE: Multidetector CT imaging of the chest was performed following the standard protocol without IV contrast. COMPARISON:  Lung bases from abdominal CT 07/27/2019. Chest CT 08/02/2018 FINDINGS: Cardiovascular: Aortic atherosclerosis. Upper normal heart size with coronary artery and mitral annulus calcifications. Slight displacement is the lower mediastinum to the left by elevated right hemidiaphragm, progressed. No pericardial effusion. Mediastinum/Nodes: No enlarged mediastinal lymph nodes. Limited assessment for hilar adenopathy in the absence of IV contrast, right hilar evaluation further limited by elevated hemidiaphragm. Small hiatal hernia with ascites in the esophageal hiatus. Thyroid gland not well seen, may be absent or diminutive. Lungs/Pleura:  Progressive elevation of right hemidiaphragm with adjacent compressive atelectasis/scarring in the right lower and middle lobe. Trace right pleural thickening without significant effusion. No pulmonary nodule or mass. No focal airspace disease or pneumonia. Upper Abdomen: Upper abdominal ascites and hepatic steatosis, assessed on recent abdominal CT. Musculoskeletal:  Chronic lower thoracic compression fracture. There are no acute or suspicious osseous abnormalities. IMPRESSION: 1. Progressive elevation of right hemidiaphragm with adjacent compressive atelectasis/scarring in the right lower and middle lobes. 2. No other acute intrathoracic abnormality. 3. Coronary artery calcifications. Aortic Atherosclerosis (ICD10-I70.0). Electronically Signed   By: Keith Rake M.D.   On: 08/01/2019 17:13   CT ABDOMEN PELVIS W CONTRAST  Result Date: 07/27/2019 CLINICAL DATA:  diarrhea and rectal bleeding, question of diverticulitis EXAM: CT ABDOMEN AND PELVIS WITH CONTRAST TECHNIQUE: Multidetector CT imaging of the abdomen and pelvis was performed using the standard protocol following bolus administration of intravenous contrast. CONTRAST:  174m OMNIPAQUE IOHEXOL 300 MG/ML  SOLN COMPARISON:  June 11, 2019 FINDINGS: Lower chest: The visualized heart size within normal limits. No pericardial fluid/thickening. Again noted is calcifications of the mitral valve and coronary artery calcifications. There is streaky atelectasis seen at both lung bases. Hepatobiliary: There is diffuse low density seen throughout the liver parenchyma. Again noted is extensive pneumobilia. The portal vein appears to be patent the patient is status post cholecystectomy. Pancreas: The patient is status post Whipple with atrophy of the pancreatic body and surgical clips around the diminutive pancreatic head. Spleen: Normal in size without focal abnormality. Adrenals/Urinary Tract: Both adrenal glands appear normal. The kidneys and collecting system  appear normal without evidence of urinary tract calculus or hydronephrosis. Bladder is unremarkable. Stomach/Bowel: Again noted is a prior gastrojejunostomy. The remainder of the small bowel is unremarkable. There is diffuse wall thickening seen surrounding the right colon especially at the hepatic flexure with surrounding mild fat stranding changes, best seen on series 2, image 43. There is extensive sigmoid colonic diverticulosis. No pericolonic free fluid or free air is seen. There appears to be mild wall thickening seen around the anorectal junction with mild perirectal edema. Vascular/Lymphatic: There is a small to moderate amount of abdominopelvic ascites. Scattered aortic atherosclerosis is noted. Again noted is a soft tissue density which surrounds the SMA and SMV at the level of the mid abdomen, series 2, image 45 which is not significantly changed since the prior exam. The SMA, however does appear to be patent. The SMV however terminates within this soft tissue density and appears to be occluded. Reproductive: The patient is status post hysterectomy. No adnexal masses or collections seen. Other: A small anterior umbilical hernia seen which contains a small amount of ascitic fluid. Musculoskeletal: There is a healed right inferior pubic rami fracture. Diffuse osteopenia. Posterior lumbar spine fixation is noted from L3 through S1. A chronic superior compression deformity of the L2 vertebral body seen with less than 25% loss in height. There is also chronic superior compression deformities of the T7 and T10 vertebral bodies with 30 to 40% loss in vertebral body height. IMPRESSION: 1. Again noted is findings suggestive of colitis/wall thickening involving the hepatic flexure and proximal right colon. 2. Diffuse wall thickening seen at the anorectal junction which could be due to proctocolitis. 3. Unchanged soft tissue density which encases the SMA and occludes the SMV at the level of the mid abdomen. 4.  Hepatic steatosis and unchanged pneumobilia. 5. Small to moderate abdominopelvic ascites. 6. Extensive diverticulosis. 7.  Aortic Atherosclerosis (ICD10-I70.0). 8. Unchanged chronic compression deformities thoracic and lumbar spine and healed fracture deformity of the right inferior pubic rami. Electronically Signed   By: BPrudencio PairM.D.   On: 07/27/2019 21:58   [place]acentesis  Result Date: 07/29/2019 INDICATION: Patient with history of pancreatic cancer, recent GI bleed, colitis,  recurrent ascites. Request made for diagnostic and therapeutic paracentesis. EXAM: ULTRASOUND GUIDED DIAGNOSTIC AND THERAPEUTIC PARACENTESIS MEDICATIONS: None COMPLICATIONS: None immediate. PROCEDURE: Informed written consent was obtained from the patient after a discussion of the risks, benefits and alternatives to treatment. A timeout was performed prior to the initiation of the procedure. Initial ultrasound scanning demonstrates a small amount of ascites within the right lower abdominal quadrant. The right lower abdomen was prepped and draped in the usual sterile fashion. 1% lidocaine was used for local anesthesia. Following this, a 19 gauge, 10-cm, Yueh catheter was introduced. An ultrasound image was saved for documentation purposes. The paracentesis was performed. The catheter was removed and a dressing was applied. The patient tolerated the procedure well without immediate post procedural complication. FINDINGS: A total of approximately 820 cc of light yellow fluid was removed. Samples were sent to the laboratory as requested by the clinical team. IMPRESSION: Successful ultrasound-guided diagnostic and therapeutic paracentesis yielding 820 cc of peritoneal fluid. Read by: Rowe Robert, PA-C Electronically Signed   By: Sandi Mariscal M.D.   On: 07/29/2019 11:07   US Paracentesis  Result Date: 07/22/2019 INDICATION: Patient with history of pancreatic cancer presents for diagnostic and therapeutic paracentesis. EXAM: ULTRASOUND  GUIDED DIAGNOSTIC AND THERAPEUTIC PARACENTESIS MEDICATIONS: 5 mL 1% lidocaine COMPLICATIONS: None immediate. PROCEDURE: Informed written consent was obtained from the patient after a discussion of the risks, benefits and alternatives to treatment. A timeout was performed prior to the initiation of the procedure. Initial ultrasound scanning demonstrates a small amount of ascites within the left lateral abdomen. The left lateral abdomen was prepped and draped in the usual sterile fashion. 1% lidocaine was used for local anesthesia. Following this, a 19 gauge, 7-cm, Yueh catheter was introduced. An ultrasound image was saved for documentation purposes. The paracentesis was performed. The catheter was removed and a dressing was applied. The patient tolerated the procedure well without immediate post procedural complication. FINDINGS: A total of approximately 300 mL of pale, yellow fluid was removed. Samples were sent to the laboratory as requested by the clinical team. IMPRESSION: Successful ultrasound-guided diagnostic and therapeutic paracentesis yielding 300 mL liters of peritoneal fluid. Read by: Brynda Greathouse PA-C Electronically Signed   By: Jacqulynn Cadet M.D.   On: 07/22/2019 12:37    Microbiology: Recent Results (from the past 240 hour(s))  SARS CORONAVIRUS 2 (TAT 6-24 HRS) Nasopharyngeal Nasopharyngeal Swab     Status: None   Collection Time: 07/27/19 10:48 PM   Specimen: Nasopharyngeal Swab  Result Value Ref Range Status   SARS Coronavirus 2 NEGATIVE NEGATIVE Final    Comment: (NOTE) SARS-CoV-2 target nucleic acids are NOT DETECTED. The SARS-CoV-2 RNA is generally detectable in upper and lower respiratory specimens during the acute phase of infection. Negative results do not preclude SARS-CoV-2 infection, do not rule out co-infections with other pathogens, and should not be used as the sole basis for treatment or other patient management decisions. Negative results must be combined with  clinical observations, patient history, and epidemiological information. The expected result is Negative. Fact Sheet for Patients: SugarRoll.be Fact Sheet for Healthcare Providers: https://www.woods-mathews.com/ This test is not yet approved or cleared by the [place] FDA and  has been authorized for detection and/or diagnosis of SARS-CoV-2 by FDA under an Emergency Use Authorization (EUA). This EUA will remain  in effect (meaning this test can be used) for the duration of the COVID-19 declaration under Section 56 4(b)(1) of the Act, 21 U.S.C. section 360bbb-3(b)(1), unless the authorization is terminated  or revoked sooner. Performed at [facility], [place] [address], [place], [place] [address]   Culture, body fluid-bottle     Status: None (Preliminary result)   Collection Time: 07/29/19 11:22 AM   Specimen: Peritoneal Washings  Result Value Ref Range Status   Specimen Description   Final    PERITONEAL CYTO Performed at [facility] [address] [place], [place] [address]    Special Requests   Final    NONE Performed at [facility], [place] [address], [place], [place] [address]    Culture   Final    NO GROWTH 3 DAYS Performed at [facility], [place] [address] [place], [place] [address]    Report Status PENDING  Incomplete  Gram stain     Status: None   Collection Time: 07/29/19 11:22 AM   Specimen: Peritoneal Washings  Result Value Ref Range Status   Specimen Description PERITONEAL  Final   Special Requests NONE  Final   Gram Stain   Final    FEW WBC PRESENT,BOTH PMN AND MONONUCLEAR NO ORGANISMS SEEN Performed at [facility], [address], [place], [place] [address]    Report Status 07/29/2019 FINAL  Final     Labs: Basic Metabolic Panel: Recent Labs  Lab 07/27/19 1918 07/27/19 1918 07/28/19 0445 07/29/19 0554 07/30/19 0505 07/31/19 0542 08/01/19 0541  NA  138   < > 141 138 138 137 134*  K 4.5   < > 4.2 4.5 4.4 4.3 4.2  CL 106   < > 107 103 103 103 101  CO2 25   < > '27 27 25 26 24  ' GLUCOSE 118*   < > 99 102* 98 98 93  BUN 16   < > '15 13 16 16 17  ' CREATININE 0.99   < > 0.78 0.89 0.87 0.83 0.83  CALCIUM 8.0*   < > 8.1* 8.4* 8.4* 8.1* 8.1*  MG 2.3  --   --  2.4 2.2  --   --    < > = values in this interval not displayed.   Liver Function Tests: Recent Labs  Lab 07/27/19 1918 07/28/19 0445 07/29/19 0554  AST '27 23 28  ' ALT '23 20 21  ' ALKPHOS 137* 138* 143*  BILITOT 0.7 0.5 0.8  PROT 5.6* 5.2* 5.4*  ALBUMIN 2.7* 2.5* 2.6*   No results for input(s): LIPASE, AMYLASE in the last 168 hours. No results for input(s): AMMONIA in the last 168 hours. CBC: Recent Labs  Lab 07/29/19 0554 07/29/19 1723 07/30/19 0505 07/31/19 0542 08/01/19 0541  WBC 4.4 5.4 4.2 5.0 6.8  NEUTROABS 2.6 3.5  --   --   --   HGB 10.2* 10.7* 9.5* 9.0* 9.3*  HCT 32.6* 34.0* 31.1* 28.9* 29.6*  MCV 94.5 93.2 98.1 96.0 94.9  PLT 104* 116* 110* 116* 130*   Cardiac Enzymes: No results for input(s): CKTOTAL, CKMB, CKMBINDEX, TROPONINI in the last 168 hours. BNP: BNP (last 3 results) No results for input(s): BNP in the last 8760 hours.  ProBNP (last 3 results) No results for input(s): PROBNP in the last 8760 hours.  CBG: No results for input(s): GLUCAP in the last 168 hours.     Signed:  Kayleen Memos, MD [facility] 08/02/2019, 9:46 AM

## 2019-08-02 NOTE — Progress Notes (Signed)
Per Dr. [name], needs OV in 1-2 weeks with him or NP. Scheduling message sent w/request to call son w/appt.

## 2019-08-02 NOTE — Telephone Encounter (Signed)
Please advise 

## 2019-08-03 LAB — CULTURE, BODY FLUID W GRAM STAIN -BOTTLE: Culture: NO GROWTH

## 2019-08-07 ENCOUNTER — Telehealth: Payer: Self-pay

## 2019-08-07 ENCOUNTER — Other Ambulatory Visit: Payer: Self-pay

## 2019-08-07 ENCOUNTER — Telehealth: Payer: Self-pay | Admitting: Family Medicine

## 2019-08-07 MED ORDER — HYDROCORTISONE ACETATE 25 MG RE SUPP: 25.0000 mg | Freq: Two times a day (BID) | RECTAL | 0 refills | Status: DC

## 2019-08-07 MED ORDER — DIPHENOXYLATE-ATROPINE 2.5-0.025 MG PO TABS: 1.0000 | ORAL_TABLET | Freq: Four times a day (QID) | ORAL | 0 refills | Status: AC | PRN

## 2019-08-07 MED ORDER — OMEPRAZOLE 20 MG PO CPDR: 20.0000 mg | DELAYED_RELEASE_CAPSULE | Freq: Every day | ORAL | 3 refills | Status: AC

## 2019-08-07 NOTE — Telephone Encounter (Signed)
  LAST APPOINTMENT DATE: 08/02/2019   NEXT APPOINTMENT DATE:@Visit  date not found  MEDICATION:hydrocortisone (ANUSOL-HC) 25 MG suppository and diphenoxylate-atropine (LOMOTIL) 2.5-0.025 MG tablet  PHARMACY: [id number] [place], [place] [address] Phone:  [phone]  Fax:  [phone]       **Let patient know to contact pharmacy at the end of the day to make sure medication is ready. **  ** Please notify patient to allow 48-72 hours to process**  **Encourage patient to contact the pharmacy for refills or they can request refills through [facility]**  CLINICAL FILLS OUT ALL BELOW:   LAST REFILL:  QTY:  REFILL DATE:    OTHER COMMENTS:    Okay for refill?  Please advise

## 2019-08-07 NOTE — Telephone Encounter (Signed)
Yes please order

## 2019-08-07 NOTE — Telephone Encounter (Signed)
MEDICATION:  Omeprazole 20 MG  PHARMACY: [facility] [address]  Comments: Received call from Pharmacy stating they are waiting for provider to send in prescription. Pt son states medication has not been refilled since August. Last provider to prescribe was [name]. Please advise.   **Let patient know to contact pharmacy at the end of the day to make sure medication is ready. **  ** Please notify patient to allow 48-72 hours to process**  **Encourage patient to contact the pharmacy for refills or they can request refills through [facility]**

## 2019-08-07 NOTE — Telephone Encounter (Signed)
Omeprazole sent to [facility]

## 2019-08-07 NOTE — Telephone Encounter (Signed)
Anusol suppository and Lomotil tab sent to [facility]

## 2019-08-07 NOTE — Telephone Encounter (Signed)
Ok to send in Omeprazole? Medication was not on patient's medication list. Please advise. Thanks

## 2019-08-08 ENCOUNTER — Inpatient Hospital Stay: Admitting: Nurse Practitioner

## 2019-08-08 ENCOUNTER — Inpatient Hospital Stay

## 2019-08-09 ENCOUNTER — Telehealth: Payer: Self-pay | Admitting: *Deleted

## 2019-08-09 ENCOUNTER — Encounter: Payer: Self-pay | Admitting: *Deleted

## 2019-08-09 ENCOUNTER — Ambulatory Visit: Payer: Medicare Other | Admitting: Cardiovascular Disease

## 2019-08-09 DIAGNOSIS — C250 Malignant neoplasm of head of pancreas: Secondary | ICD-10-CM

## 2019-08-09 NOTE — Telephone Encounter (Signed)
Hospice RN called to request paracentesis next week per son's request. Scheduled for 3/18 at 1115/1130--no answer at home #. Sent Mychart message w/appointment.

## 2019-08-12 ENCOUNTER — Other Ambulatory Visit: Payer: Self-pay | Admitting: Nurse Practitioner

## 2019-08-12 ENCOUNTER — Telehealth: Payer: Self-pay | Admitting: *Deleted

## 2019-08-12 DIAGNOSIS — C250 Malignant neoplasm of head of pancreas: Secondary | ICD-10-CM

## 2019-08-12 MED ORDER — ALPRAZOLAM 0.25 MG PO TABS: 0.2500 mg | ORAL_TABLET | Freq: Two times a day (BID) | ORAL | 0 refills | Status: AC | PRN

## 2019-08-12 MED ORDER — HYDROCODONE-ACETAMINOPHEN 5-325 MG PO TABS: 1.0000 | ORAL_TABLET | ORAL | 0 refills | Status: AC | PRN

## 2019-08-12 NOTE — Telephone Encounter (Signed)
Hospice left a message stating [name] needs a refill of Xanax 2.05 mg BID prn anxiety. She was prescribed this when she was discharged from the hospital.

## 2019-08-12 NOTE — Telephone Encounter (Signed)
Beth, RN from [facility] states patient is currently taking Hydrocodone 5-325 mg 1 tablet every 6 hours prn pain. Is having increased abdominal pain. Requesting an increase in dose or new pain medication.

## 2019-08-15 ENCOUNTER — Ambulatory Visit (HOSPITAL_COMMUNITY)
Admission: RE | Admit: 2019-08-15 | Discharge: 2019-08-15 | Disposition: A | Discharge status: Home / Self Care | Source: Ambulatory Visit | Attending: Oncology | Admitting: Oncology

## 2019-08-15 ENCOUNTER — Inpatient Hospital Stay (HOSPITAL_BASED_OUTPATIENT_CLINIC_OR_DEPARTMENT_OTHER): Admitting: Nurse Practitioner

## 2019-08-15 ENCOUNTER — Inpatient Hospital Stay: Attending: Oncology

## 2019-08-15 ENCOUNTER — Other Ambulatory Visit: Payer: Self-pay

## 2019-08-15 ENCOUNTER — Encounter: Payer: Self-pay | Admitting: Nurse Practitioner

## 2019-08-15 VITALS — BP 144/69 | HR 81 | Temp 98.9°F | Resp 18 | Wt 140.7 lb

## 2019-08-15 DIAGNOSIS — K922 Gastrointestinal hemorrhage, unspecified: Secondary | ICD-10-CM | POA: Insufficient documentation

## 2019-08-15 DIAGNOSIS — R197 Diarrhea, unspecified: Secondary | ICD-10-CM | POA: Diagnosis not present

## 2019-08-15 DIAGNOSIS — C250 Malignant neoplasm of head of pancreas: Secondary | ICD-10-CM | POA: Insufficient documentation

## 2019-08-15 DIAGNOSIS — R109 Unspecified abdominal pain: Secondary | ICD-10-CM | POA: Insufficient documentation

## 2019-08-15 DIAGNOSIS — D696 Thrombocytopenia, unspecified: Secondary | ICD-10-CM | POA: Diagnosis not present

## 2019-08-15 DIAGNOSIS — G893 Neoplasm related pain (acute) (chronic): Secondary | ICD-10-CM | POA: Diagnosis not present

## 2019-08-15 DIAGNOSIS — R188 Other ascites: Secondary | ICD-10-CM | POA: Diagnosis not present

## 2019-08-15 LAB — CMP (CANCER CENTER ONLY)
ALT: 17 U/L (ref 0–44)
AST: 21 U/L (ref 15–41)
Albumin: 2.7 g/dL — ABNORMAL LOW (ref 3.5–5.0)
Alkaline Phosphatase: 204 U/L — ABNORMAL HIGH (ref 38–126)
Anion gap: 10 (ref 5–15)
BUN: 15 mg/dL (ref 8–23)
CO2: 25 mmol/L (ref 22–32)
Calcium: 8.4 mg/dL — ABNORMAL LOW (ref 8.9–10.3)
Chloride: 106 mmol/L (ref 98–111)
Creatinine: 0.81 mg/dL (ref 0.44–1.00)
GFR, Est AFR Am: >60 mL/min (ref >60)
GFR, Estimated: >60 mL/min (ref >60)
Glucose, Bld: 132 mg/dL — ABNORMAL HIGH (ref 70–99)
Potassium: 3.9 mmol/L (ref 3.5–5.1)
Sodium: 141 mmol/L (ref 135–145)
Total Bilirubin: 0.4 mg/dL (ref 0.3–1.2)
Total Protein: 6.1 g/dL — ABNORMAL LOW (ref 6.5–8.1)

## 2019-08-15 LAB — CBC WITH DIFFERENTIAL (CANCER CENTER ONLY)
Abs Immature Granulocytes: 0.02 10*3/uL (ref 0.00–0.07)
Basophils Absolute: 0 10*3/uL (ref 0.0–0.1)
Basophils Relative: 1 %
Eosinophils Absolute: 0.1 10*3/uL (ref 0.0–0.5)
Eosinophils Relative: 2 %
HCT: 31.7 % — ABNORMAL LOW (ref 36.0–46.0)
Hemoglobin: 9.5 g/dL — ABNORMAL LOW (ref 12.0–15.0)
Immature Granulocytes: 0 %
Lymphocytes Relative: 12 %
Lymphs Abs: 0.6 10*3/uL — ABNORMAL LOW (ref 0.7–4.0)
MCH: 28.1 pg (ref 26.0–34.0)
MCHC: 30 g/dL (ref 30.0–36.0)
MCV: 93.8 fL (ref 80.0–100.0)
Monocytes Absolute: 0.4 10*3/uL (ref 0.1–1.0)
Monocytes Relative: 7 %
Neutro Abs: 4.2 10*3/uL (ref 1.7–7.7)
Neutrophils Relative %: 78 %
Platelet Count: 118 10*3/uL — ABNORMAL LOW (ref 150–400)
RBC: 3.38 MIL/uL — ABNORMAL LOW (ref 3.87–5.11)
RDW: 14.7 % (ref 11.5–15.5)
WBC Count: 5.3 10*3/uL (ref 4.0–10.5)
nRBC: 0 % (ref 0.0–0.2)

## 2019-08-15 MED ORDER — OXYCODONE HCL ER 10 MG PO T12A: 10.0000 mg | EXTENDED_RELEASE_TABLET | Freq: Every day | ORAL | 0 refills | Status: AC

## 2019-08-15 MED ORDER — COLESTIPOL HCL 5 G PO PACK: 5.0000 g | PACK | Freq: Two times a day (BID) | ORAL | 1 refills | Status: DC

## 2019-08-15 NOTE — Procedures (Signed)
PROCEDURE SUMMARY:  Successful image-guided paracentesis from the right lateral abdomen.  Yielded 1.0 liter of hazy yellow fluid.  No immediate complications.  EBL < 5 mL. Patient tolerated well.   Specimen was not sent for labs.  Please see imaging section of Epic for full dictation.   Earley Abide PA-C 08/15/2019 12:19 PM

## 2019-08-15 NOTE — Progress Notes (Addendum)
[facility] OFFICE PROGRESS NOTE   Diagnosis: Pancreas cancer  INTERVAL HISTORY:   [name] returns for follow-up.  She continues to have diarrhea 1-5 times a day.  She and her son do not feel Imodium or Lomotil are very effective.  No further rectal bleeding.  She recently began having increased abdominal pain.  She is taking 2 hydrocodone tablets, on average 3 times a day.  She is enrolled in the hospice program.  The nurse comes to their home once a week and the aide 3 times a week.  She now has a hospital bed and recently supplemental oxygen.  Her son reports she has had 3 falls.  Objective:  Vital signs in last 24 hours:  Blood pressure (!) 144/69, pulse 81, temperature 98.9 F (37.2 C), temperature source Temporal, resp. rate 18, weight 140 lb 11.2 oz (63.8 kg), SpO2 97 %.    HEENT: No thrush or ulcers. Resp: Lungs clear bilaterally. Cardio: Regular rate and rhythm. GI: Abdomen distended, tender over the right abdomen. Vascular: Trace edema at the lower legs bilaterally.   Lab Results:  Lab Results  Component Value Date   WBC 5.3 08/15/2019   HGB 9.5 (L) 08/15/2019   HCT 31.7 (L) 08/15/2019   MCV 93.8 08/15/2019   PLT 118 (L) 08/15/2019   NEUTROABS 4.2 08/15/2019    Imaging:  No results found.  Medications: I have reviewed the patient's current medications.  Assessment/Plan: 1. 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  CT abdomen/pelvis 01/19/2015 with no evidence of recurrent/metastatic disease.  CT abdomen/pelvis 07/20/2018-infiltrating soft tissue at the pancreas resection bed with involvement of the  SMA, occlusion of the SMV with collaterals. New L2 compression fracture  SBRT beginning 08/21/2018, completed 08/31/2018, 5 fractions  CT 12/03/2018-no change in abnormal soft tissue encasing the SMA  CT 03/27/2019-new wall thickening and mucosal enhancement in the sigmoid and rectum, hepatic flexure wall thickening has resolved new or progressive ascending colonic wall thickening. Persistent soft tissue surrounding the SMA-stable, no ascites or evidence of peritoneal metastases, enlarging ventral hernias  CT abdomen/pelvis 06/11/2019-ascending colitis, enlarging soft tissue surrounding the SMV and SMA with occlusion of the SMV and ascites, diverticulosis  2. Nausea following the Whipple procedure-improved 3. Mild thrombocytopenia , Chronic  4.Pain secondary to local recurrence of pancreas cancer 5. Diarrhea-etiology unclear, Giardia antigen positive 03/21/2019  6.  GI bleeding 7.  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  Cytology negative on 07/22/2019 and 07/29/2019   Disposition: [name] appears unchanged.  She is enrolled in the hospice program.  She is accompanied by her son to today's visit.  She is having increased abdominal pain, requiring more breakthrough pain medication.  We discussed trying a long-acting pain medication.  She agrees.  She will begin OxyContin 10 mg once daily and continue hydrocodone as needed for breakthrough pain.  For the diarrhea she will begin Colestid 5 g twice daily.  We reviewed the dosing instructions to take other medications either 1 hour before or 4 hours after the Colestid dose.   She will return for lab and follow-up in 3 to 4 weeks.  She will contact the office in the interim with any problems.  Patient seen with Dr. [name].  Ned Card ANP/GNP-BC   08/15/2019  2:27 PM  This was a shared visit with Ned Card.  [name] continues to have abdomen/back pain.  We discussed the current pain regimen and medical options with Ms.  Mendes and her son.  The plan is to begin once daily OxyContin in addition to the current hydrocodone.  She will begin a trial of Colestid for persistent diarrhea.  Julieanne Manson, MD

## 2019-08-24 ENCOUNTER — Other Ambulatory Visit: Payer: Self-pay | Admitting: Nurse Practitioner

## 2019-08-24 DIAGNOSIS — C250 Malignant neoplasm of head of pancreas: Secondary | ICD-10-CM

## 2019-08-27 ENCOUNTER — Other Ambulatory Visit: Payer: Self-pay | Admitting: *Deleted

## 2019-09-02 ENCOUNTER — Telehealth: Payer: Self-pay | Admitting: Family Medicine

## 2019-09-02 ENCOUNTER — Other Ambulatory Visit: Payer: Self-pay | Admitting: Oncology

## 2019-09-02 DIAGNOSIS — C250 Malignant neoplasm of head of pancreas: Secondary | ICD-10-CM

## 2019-09-02 NOTE — Telephone Encounter (Signed)
PCP is aware of patient passing.

## 2019-09-02 NOTE — Telephone Encounter (Signed)
FYI> Patient son called stating that patient had passed away last week.

## 2019-09-03 ENCOUNTER — Telehealth: Payer: Self-pay | Admitting: Family Medicine

## 2019-09-03 ENCOUNTER — Telehealth: Payer: Self-pay | Admitting: *Deleted

## 2019-09-03 NOTE — Telephone Encounter (Signed)
Received death certificate from funeral home. Gave to Dr. Jonni Sanger to complete.

## 2019-09-03 NOTE — Telephone Encounter (Signed)
Rodena Piety calling from the [facility]. Rodena Piety would like to know will Dr. Jonni Sanger be signing the death certificate. Rodena Piety best contact number [phone]

## 2019-09-03 NOTE — Telephone Encounter (Signed)
Please send sympathy card to son. thanks

## 2019-09-03 NOTE — Telephone Encounter (Signed)
Husband called to let Dr [name] and Manuela Schwartz know that [name] passed away at [facility] on 09-21-19 @ 1125. She was at home until the last 3 days. He wanted to Thank you for all of the wonderful care over the last 6 years.

## 2019-09-03 NOTE — Telephone Encounter (Signed)
[facility] will be dropping death certificate off for Dr. Jonni Sanger to sign.

## 2019-09-03 NOTE — Telephone Encounter (Signed)
PCP has been given death certificate

## 2019-09-12 ENCOUNTER — Ambulatory Visit: Payer: Medicare Other | Admitting: Oncology

## 2019-09-12 ENCOUNTER — Other Ambulatory Visit: Payer: Medicare Other

## 2019-09-28 DEATH — deceased
